# Patient Record
Sex: Female | Born: 1950 | Race: White | Hispanic: No | Marital: Single | Smoking: Former smoker
Health system: Southern US, Community
[De-identification: ages and names within clinical notes are randomized; demographics above are authoritative.]

## PROBLEM LIST (undated history)

## (undated) DIAGNOSIS — M316 Other giant cell arteritis: Secondary | ICD-10-CM

## (undated) DIAGNOSIS — I999 Unspecified disorder of circulatory system: Secondary | ICD-10-CM

## (undated) DIAGNOSIS — I340 Nonrheumatic mitral (valve) insufficiency: Secondary | ICD-10-CM

## (undated) DIAGNOSIS — K2970 Gastritis, unspecified, without bleeding: Secondary | ICD-10-CM

## (undated) DIAGNOSIS — D869 Sarcoidosis, unspecified: Secondary | ICD-10-CM

## (undated) DIAGNOSIS — Z972 Presence of dental prosthetic device (complete) (partial): Secondary | ICD-10-CM

## (undated) DIAGNOSIS — Z9710 Presence of artificial limb (complete) (partial), unspecified: Secondary | ICD-10-CM

## (undated) DIAGNOSIS — K259 Gastric ulcer, unspecified as acute or chronic, without hemorrhage or perforation: Secondary | ICD-10-CM

## (undated) DIAGNOSIS — M810 Age-related osteoporosis without current pathological fracture: Secondary | ICD-10-CM

## (undated) DIAGNOSIS — R0600 Dyspnea, unspecified: Secondary | ICD-10-CM

## (undated) DIAGNOSIS — F32A Depression, unspecified: Secondary | ICD-10-CM

## (undated) DIAGNOSIS — R42 Dizziness and giddiness: Secondary | ICD-10-CM

## (undated) DIAGNOSIS — M1990 Unspecified osteoarthritis, unspecified site: Secondary | ICD-10-CM

## (undated) DIAGNOSIS — Z86718 Personal history of other venous thrombosis and embolism: Secondary | ICD-10-CM

## (undated) DIAGNOSIS — K219 Gastro-esophageal reflux disease without esophagitis: Secondary | ICD-10-CM

## (undated) DIAGNOSIS — I10 Essential (primary) hypertension: Secondary | ICD-10-CM

## (undated) DIAGNOSIS — I739 Peripheral vascular disease, unspecified: Secondary | ICD-10-CM

## (undated) DIAGNOSIS — E785 Hyperlipidemia, unspecified: Secondary | ICD-10-CM

## (undated) DIAGNOSIS — F329 Major depressive disorder, single episode, unspecified: Secondary | ICD-10-CM

## (undated) HISTORY — DX: Sarcoidosis, unspecified: D86.9

## (undated) HISTORY — PX: ADENOIDECTOMY: SUR15

## (undated) HISTORY — PX: BREAST CYST ASPIRATION: SHX578

## (undated) HISTORY — DX: Depression, unspecified: F32.A

## (undated) HISTORY — DX: Gastric ulcer, unspecified as acute or chronic, without hemorrhage or perforation: K25.9

## (undated) HISTORY — DX: Major depressive disorder, single episode, unspecified: F32.9

## (undated) HISTORY — DX: Personal history of other venous thrombosis and embolism: Z86.718

## (undated) HISTORY — DX: Other giant cell arteritis: M31.6

## (undated) HISTORY — DX: Gastritis, unspecified, without bleeding: K29.70

## (undated) HISTORY — PX: VASCULAR SURGERY: SHX849

## (undated) HISTORY — PX: TONSILLECTOMY: SUR1361

## (undated) HISTORY — PX: OTHER SURGICAL HISTORY: SHX169

---
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
## 2010-08-21 LAB — HM PAP SMEAR: HM Pap smear: NORMAL

## 2011-06-08 DIAGNOSIS — I739 Peripheral vascular disease, unspecified: Secondary | ICD-10-CM | POA: Insufficient documentation

## 2011-06-08 DIAGNOSIS — I6529 Occlusion and stenosis of unspecified carotid artery: Secondary | ICD-10-CM | POA: Insufficient documentation

## 2011-06-08 DIAGNOSIS — I779 Disorder of arteries and arterioles, unspecified: Secondary | ICD-10-CM | POA: Insufficient documentation

## 2012-08-21 HISTORY — PX: HEMORROIDECTOMY: SUR656

## 2014-01-28 DIAGNOSIS — I6529 Occlusion and stenosis of unspecified carotid artery: Secondary | ICD-10-CM | POA: Insufficient documentation

## 2014-02-10 ENCOUNTER — Ambulatory Visit: Payer: Self-pay | Admitting: Family Medicine

## 2014-03-11 ENCOUNTER — Ambulatory Visit: Payer: Self-pay | Admitting: Family Medicine

## 2014-08-21 LAB — HM MAMMOGRAPHY: HM MAMMO: NORMAL

## 2014-10-12 DIAGNOSIS — Z7189 Other specified counseling: Secondary | ICD-10-CM | POA: Diagnosis not present

## 2014-10-12 DIAGNOSIS — M810 Age-related osteoporosis without current pathological fracture: Secondary | ICD-10-CM | POA: Diagnosis not present

## 2014-10-12 DIAGNOSIS — I739 Peripheral vascular disease, unspecified: Secondary | ICD-10-CM | POA: Diagnosis not present

## 2014-10-12 DIAGNOSIS — F322 Major depressive disorder, single episode, severe without psychotic features: Secondary | ICD-10-CM | POA: Diagnosis not present

## 2014-10-12 DIAGNOSIS — K219 Gastro-esophageal reflux disease without esophagitis: Secondary | ICD-10-CM | POA: Diagnosis not present

## 2014-11-11 DIAGNOSIS — I739 Peripheral vascular disease, unspecified: Secondary | ICD-10-CM | POA: Diagnosis not present

## 2014-11-11 DIAGNOSIS — K219 Gastro-esophageal reflux disease without esophagitis: Secondary | ICD-10-CM | POA: Diagnosis not present

## 2014-11-11 DIAGNOSIS — Z0000 Encounter for general adult medical examination without abnormal findings: Secondary | ICD-10-CM | POA: Diagnosis not present

## 2014-11-24 DIAGNOSIS — R12 Heartburn: Secondary | ICD-10-CM | POA: Diagnosis not present

## 2014-11-30 ENCOUNTER — Ambulatory Visit
Admit: 2014-11-30 | Disposition: A | Discharge status: Home / Self Care | Payer: Self-pay | Attending: Family Medicine | Admitting: Family Medicine

## 2014-11-30 DIAGNOSIS — Z1231 Encounter for screening mammogram for malignant neoplasm of breast: Secondary | ICD-10-CM | POA: Diagnosis not present

## 2014-12-03 DIAGNOSIS — I70212 Atherosclerosis of native arteries of extremities with intermittent claudication, left leg: Secondary | ICD-10-CM | POA: Diagnosis not present

## 2014-12-03 DIAGNOSIS — I10 Essential (primary) hypertension: Secondary | ICD-10-CM | POA: Diagnosis not present

## 2014-12-03 DIAGNOSIS — I6529 Occlusion and stenosis of unspecified carotid artery: Secondary | ICD-10-CM | POA: Diagnosis not present

## 2014-12-03 DIAGNOSIS — E785 Hyperlipidemia, unspecified: Secondary | ICD-10-CM | POA: Diagnosis not present

## 2014-12-18 ENCOUNTER — Encounter: Payer: Self-pay | Admitting: *Deleted

## 2014-12-21 ENCOUNTER — Encounter
Admission: RE | Disposition: A | Discharge status: Home / Self Care | Payer: Self-pay | Source: Ambulatory Visit | Attending: Gastroenterology

## 2014-12-21 ENCOUNTER — Ambulatory Visit: Payer: Medicare Other | Admitting: Anesthesiology

## 2014-12-21 ENCOUNTER — Ambulatory Visit
Admission: RE | Admit: 2014-12-21 | Discharge: 2014-12-21 | Disposition: A | Discharge status: Home / Self Care | Payer: Medicare Other | Source: Ambulatory Visit | Attending: Gastroenterology | Admitting: Gastroenterology

## 2014-12-21 DIAGNOSIS — K2211 Ulcer of esophagus with bleeding: Secondary | ICD-10-CM | POA: Insufficient documentation

## 2014-12-21 DIAGNOSIS — Z79899 Other long term (current) drug therapy: Secondary | ICD-10-CM | POA: Insufficient documentation

## 2014-12-21 DIAGNOSIS — I999 Unspecified disorder of circulatory system: Secondary | ICD-10-CM | POA: Insufficient documentation

## 2014-12-21 DIAGNOSIS — K210 Gastro-esophageal reflux disease with esophagitis: Secondary | ICD-10-CM | POA: Insufficient documentation

## 2014-12-21 DIAGNOSIS — Z9889 Other specified postprocedural states: Secondary | ICD-10-CM | POA: Diagnosis not present

## 2014-12-21 DIAGNOSIS — Z87891 Personal history of nicotine dependence: Secondary | ICD-10-CM | POA: Insufficient documentation

## 2014-12-21 DIAGNOSIS — Z7982 Long term (current) use of aspirin: Secondary | ICD-10-CM | POA: Insufficient documentation

## 2014-12-21 DIAGNOSIS — M810 Age-related osteoporosis without current pathological fracture: Secondary | ICD-10-CM | POA: Insufficient documentation

## 2014-12-21 DIAGNOSIS — K228 Other specified diseases of esophagus: Secondary | ICD-10-CM | POA: Diagnosis not present

## 2014-12-21 DIAGNOSIS — K219 Gastro-esophageal reflux disease without esophagitis: Secondary | ICD-10-CM | POA: Diagnosis present

## 2014-12-21 DIAGNOSIS — R12 Heartburn: Secondary | ICD-10-CM | POA: Diagnosis not present

## 2014-12-21 HISTORY — DX: Gastro-esophageal reflux disease without esophagitis: K21.9

## 2014-12-21 HISTORY — DX: Age-related osteoporosis without current pathological fracture: M81.0

## 2014-12-21 HISTORY — DX: Dizziness and giddiness: R42

## 2014-12-21 HISTORY — DX: Unspecified disorder of circulatory system: I99.9

## 2014-12-21 HISTORY — PX: ESOPHAGOGASTRODUODENOSCOPY: SHX5428

## 2014-12-21 SURGERY — EGD (ESOPHAGOGASTRODUODENOSCOPY)
Anesthesia: Monitor Anesthesia Care | Site: Esophagus

## 2014-12-21 MED ORDER — GLYCOPYRROLATE 0.2 MG/ML IJ SOLN
INTRAMUSCULAR | Status: DC | PRN
Start: 2014-12-21 — End: 2014-12-21
  Administered 2014-12-21: 0.2 mg via INTRAVENOUS

## 2014-12-21 MED ORDER — ACETAMINOPHEN 160 MG/5ML PO SOLN: 325.0000 mg | ORAL | Status: DC | PRN

## 2014-12-21 MED ORDER — LIDOCAINE HCL (CARDIAC) 20 MG/ML IV SOLN
INTRAVENOUS | Status: DC | PRN
  Administered 2014-12-21: 60 mg via INTRAVENOUS

## 2014-12-21 MED ORDER — ACETAMINOPHEN 325 MG PO TABS: 325.0000 mg | ORAL_TABLET | ORAL | Status: DC | PRN

## 2014-12-21 MED ORDER — STERILE WATER FOR IRRIGATION IR SOLN
Status: DC | PRN
  Administered 2014-12-21: 09:00:00

## 2014-12-21 MED ORDER — LACTATED RINGERS IV SOLN
INTRAVENOUS | Status: DC
  Administered 2014-12-21 (×2): via INTRAVENOUS

## 2014-12-21 MED ORDER — PROPOFOL 10 MG/ML IV BOLUS
INTRAVENOUS | Status: DC | PRN
  Administered 2014-12-21: 30 mg via INTRAVENOUS
  Administered 2014-12-21 (×2): 60 mg via INTRAVENOUS

## 2014-12-21 NOTE — Transfer of Care (Signed)
Immediate Anesthesia Transfer of Care Note  Patient: Sara [name]  Procedure(s) Performed: Procedure(s): ESOPHAGOGASTRODUODENOSCOPY (EGD) (N/A)  Patient Location: PACU  Anesthesia Type: MAC  Level of Consciousness: awake, alert  and patient cooperative  Airway and Oxygen Therapy: Patient Spontanous Breathing and Patient connected to supplemental oxygen  Post-op Assessment: Post-op Vital signs reviewed, Patient's Cardiovascular Status Stable, Respiratory Function Stable, Patent Airway and No signs of Nausea or vomiting  Post-op Vital Signs: Reviewed and stable  Complications: No apparent anesthesia complications

## 2014-12-21 NOTE — H&P (Signed)
@  NTIR@  Primary Care Physician:  Keith Rake, MD Primary Gastroenterologist:  Dr. Allen Norris  Pre-Procedure History & Physical: HPI:  Sara [name] is a 64 y.o. female is here for an EGD.   Past Medical History  Diagnosis Date  . GERD (gastroesophageal reflux disease)   . Osteoporosis   . Vascular disease     Sees Dr. Delana Meyer  . Vertigo     Last episode approx Aug 2015    Past Surgical History  Procedure Laterality Date  . Vascular surgery  4431,5400    Fem-Pop Bypass  . Hemorroidectomy  2014    Prior to Admission medications   Medication Sig Start Date End Date Taking? Authorizing Provider  alendronate (FOSAMAX) 10 MG tablet Take 10 mg by mouth daily before breakfast. Take with a full glass of water on an empty stomach.   Yes Historical Provider, MD  aspirin 81 MG tablet Take 81 mg by mouth daily. PM   Yes Historical Provider, MD  buPROPion (WELLBUTRIN SR) 150 MG 12 hr tablet Take 150 mg by mouth daily. PM   Yes Historical Provider, MD  Multiple Vitamin (MULTIVITAMIN) capsule Take 1 capsule by mouth daily. PM   Yes Historical Provider, MD  venlafaxine XR (EFFEXOR-XR) 75 MG 24 hr capsule Take 75 mg by mouth daily. PM   Yes Historical Provider, MD  Vitamin D, Cholecalciferol, 1000 UNITS CAPS Take 1,000 Units by mouth daily. PM   Yes Historical Provider, MD    Allergies as of 12/17/2014  . (Not on File)    History reviewed. No pertinent family history.  History   Social History  . Marital Status: Single    Spouse Name: N/A  . Number of Children: N/A  . Years of Education: N/A   Occupational History  . Not on file.   Social History Main Topics  . Smoking status: Former Smoker    Quit date: 08/22/1991  . Smokeless tobacco: Not on file  . Alcohol Use: No  . Drug Use: Not on file  . Sexual Activity: Not on file   Other Topics Concern  . Not on file   Social History Narrative  . No narrative on file    Review of Systems: See HPI, otherwise negative ROS  Physical  Exam: Temp(Src) 97.9 F (36.6 C) (Tympanic)  Ht 5\' 4"  (1.626 m)  Wt 136 lb (61.689 kg)  BMI 23.33 kg/m2 General:   Alert,  pleasant and cooperative in NAD Head:  Normocephalic and atraumatic. Neck:  Supple; no masses or thyromegaly. Lungs:  Clear throughout to auscultation.    Heart:  Regular rate and rhythm. Abdomen:  Soft, nontender and nondistended. Normal bowel sounds, without guarding, and without rebound.   Neurologic:  Alert and  oriented x4;  grossly normal neurologically.  Impression/Plan: Sara [name] is here for an EGD to be performed for Heartburn  Risks, benefits, limitations, and alternatives regarding EGD have been reviewed with the patient.  Questions have been answered.  All parties agreeable.

## 2014-12-21 NOTE — Anesthesia Postprocedure Evaluation (Signed)
  Anesthesia Post-op Note  Patient: Sara [name]  Procedure(s) Performed: Procedure(s): ESOPHAGOGASTRODUODENOSCOPY (EGD) (N/A)  Anesthesia type:MAC  Patient location: PACU  Post pain: Pain level controlled  Post assessment: Post-op Vital signs reviewed, Patient's Cardiovascular Status Stable, Respiratory Function Stable, Patent Airway and No signs of Nausea or vomiting  Post vital signs: Reviewed and stable  Last Vitals:  Filed Vitals:   12/21/14 0914  BP:   Pulse: 106  Temp:   Resp: 24    Level of consciousness: awake, alert  and patient cooperative  Complications: No apparent anesthesia complications

## 2014-12-21 NOTE — Anesthesia Preprocedure Evaluation (Signed)
Anesthesia Evaluation  Patient identified by MRN, date of birth, ID band  Airway Mallampati: I  TM Distance: >3 FB     Dental  (+) Upper Dentures   Pulmonary former smoker,    Pulmonary exam normal       Cardiovascular     Neuro/Psych    GI/Hepatic GERD-  ,  Endo/Other    Renal/GU      Musculoskeletal   Abdominal Normal abdominal exam  (+)   Peds  Hematology   Anesthesia Other Findings   Reproductive/Obstetrics                             Anesthesia Physical Anesthesia Plan  ASA: II  Anesthesia Plan: MAC   Post-op Pain Management:    Induction:   Airway Management Planned:   Additional Equipment:   Intra-op Plan:   Post-operative Plan:   Informed Consent: I have reviewed the patients History and Physical, chart, labs and discussed the procedure including the risks, benefits and alternatives for the proposed anesthesia with the patient or authorized representative who has indicated his/her understanding and acceptance.     Plan Discussed with: Anesthesiologist  Anesthesia Plan Comments:         Anesthesia Quick Evaluation

## 2014-12-21 NOTE — Discharge Instructions (Signed)
YOU HAD AN ENDOSCOPIC PROCEDURE TODAY: Refer to the procedure report that was given to you for any specific questions about what was found during the examination.  If the procedure report does not answer your questions, please call your gastroenterologist to clarify. ° °YOU SHOULD EXPECT: Some feelings of bloating in the abdomen. Passage of more gas than usual.  Walking can help get rid of the air that was put into your GI tract during the procedure and reduce the bloating. If you had a lower endoscopy (such as a colonoscopy or flexible sigmoidoscopy) you may notice spotting of blood in your stool or on the toilet paper.  ° °DIET: Your first meal following the procedure should be a light meal and then it is ok to progress to your normal diet.  A half-sandwich or bowl of soup is an example of a good first meal.  Heavy or fried foods are harder to digest and may make you feel nasueas or bloated.  Drink plenty of fluids but you should avoid alcoholic beverages for 24 hours. ° °ACTIVITY: Your care partner should take you home directly after the procedure.  You should plan to take it easy, moving slowly for the rest of the day.  You can resume normal activity the day after the procedure however you should NOT DRIVE, make legal decisions or use heavy machinery for 24 hours (because of the sedation medicines used during the test).   ° °SYMPTOMS TO REPORT IMMEDIATELY  °A gastroenterologist can be reached at any hour.  Please call your doctor's office for any of the following symptoms: ° °· Following lower endoscopy (colonoscopy, flexible sigmoidoscopy) ° Excessive amounts of blood in the stool ° Significant tenderness, worsening of abdominal pains ° Swelling of the abdomen that is new, acute ° Fever of 100° or higher °· Following upper endoscopy (EGD, EUS, ERCP) ° Vomiting of blood or coffee ground material ° New, significant abdominal pain ° New, significant chest pain or pain under the shoulder blades ° Painful or  persistently difficult swallowing ° New shortness of breath ° Black, tarry-looking stools ° °FOLLOW UP: °If any biopsies were taken you will be contacted by phone or by letter within the next 1-3 weeks.  Call your gastroenterologist if you have not heard about the biopsies in 3 weeks.  ° °Please also call your gastroenterologist's office with any specific questions about appointments or follow up tests. °

## 2014-12-21 NOTE — Op Note (Addendum)
[facility] Gastroenterology Patient Name: Sara [name] Procedure Date: 12/21/2014 7:09 AM MRN: [id number] Account #: [id number] Date of Birth: [birth year] Admit Type: Outpatient Age: 64 Room: [facility] OR ROOM 01 Gender: Female Note Status: Finalized Procedure:         Upper GI endoscopy Indications:       Heartburn Providers:         Lucilla Lame, MD Referring MD:      Otila Back. Manuella Ghazi (Referring MD) Medicines:         Propofol per Anesthesia Complications:     No immediate complications. Procedure:         Pre-Anesthesia Assessment:                    - Prior to the procedure, a History and Physical was                     performed, and patient medications and allergies were                     reviewed. The patient's tolerance of previous anesthesia                     was also reviewed. The risks and benefits of the procedure                     and the sedation options and risks were discussed with the                     patient. All questions were answered, and informed consent                     was obtained. Prior Anticoagulants: The patient has taken                     no previous anticoagulant or antiplatelet agents. ASA                     Grade Assessment: II - A patient with mild systemic                     disease. After reviewing the risks and benefits, the                     patient was deemed in satisfactory condition to undergo                     the procedure.                    After obtaining informed consent, the endoscope was passed                     under direct vision. Throughout the procedure, the                     patient's blood pressure, pulse, and oxygen saturations                     were monitored continuously. The Olympus GIF-HQ190                     Endoscope (S#. [id number]) was introduced through the mouth,  and advanced to the second part of duodenum. The upper GI                     endoscopy was  accomplished without difficulty. The patient                     tolerated the procedure well. Findings:      Few cratered esophageal ulcers with oozing blood and stigmata of recent       bleeding were found in the lower third of the esophagus. Biopsies were       taken with a cold forceps for histology.      The stomach was normal.      The examined duodenum was normal. Impression:        - Bleeding esophageal ulcers. Biopsied.                    - Normal stomach.                    - Normal examined duodenum. Recommendation:    - Await pathology results. Procedure Code(s): --- Professional ---                    [phone], Esophagogastroduodenoscopy, flexible, transoral;                     with biopsy, single or multiple Diagnosis Code(s): --- Professional ---                    R12, Heartburn                    K22.11, Ulcer of esophagus with bleeding CPT copyright 2014 American [employer] Association. All rights reserved. The codes documented in this report are preliminary and upon coder review may  be revised to meet current compliance requirements. Lucilla Lame, MD 12/21/2014 8:47:44 AM This report has been signed electronically. Number of Addenda: 0 Note Initiated On: 12/21/2014 7:09 AM      [facility]

## 2014-12-25 ENCOUNTER — Encounter: Payer: Self-pay | Admitting: Gastroenterology

## 2014-12-28 DIAGNOSIS — R12 Heartburn: Secondary | ICD-10-CM | POA: Diagnosis not present

## 2015-01-22 ENCOUNTER — Encounter: Payer: Self-pay | Admitting: Gastroenterology

## 2015-03-09 ENCOUNTER — Ambulatory Visit (INDEPENDENT_AMBULATORY_CARE_PROVIDER_SITE_OTHER): Payer: Medicare Other | Admitting: Family Medicine

## 2015-03-09 ENCOUNTER — Encounter: Payer: Self-pay | Admitting: Family Medicine

## 2015-03-09 ENCOUNTER — Encounter (INDEPENDENT_AMBULATORY_CARE_PROVIDER_SITE_OTHER): Payer: Self-pay

## 2015-03-09 VITALS — BP 118/78 | HR 99 | Temp 98.7°F | Resp 18 | Ht 64.0 in | Wt 134.0 lb

## 2015-03-09 DIAGNOSIS — F339 Major depressive disorder, recurrent, unspecified: Secondary | ICD-10-CM | POA: Diagnosis not present

## 2015-03-09 DIAGNOSIS — K219 Gastro-esophageal reflux disease without esophagitis: Secondary | ICD-10-CM

## 2015-03-09 DIAGNOSIS — M810 Age-related osteoporosis without current pathological fracture: Secondary | ICD-10-CM | POA: Insufficient documentation

## 2015-03-09 DIAGNOSIS — I739 Peripheral vascular disease, unspecified: Secondary | ICD-10-CM | POA: Insufficient documentation

## 2015-03-09 HISTORY — DX: Gastro-esophageal reflux disease without esophagitis: K21.9

## 2015-03-09 MED ORDER — BUPROPION HCL ER (XL) 150 MG PO TB24: 150.0000 mg | ORAL_TABLET | Freq: Every day | ORAL | Status: DC

## 2015-03-09 MED ORDER — VENLAFAXINE HCL ER 75 MG PO CP24: 75.0000 mg | ORAL_CAPSULE | Freq: Every day | ORAL | Status: DC

## 2015-03-09 NOTE — Progress Notes (Signed)
Name: Sara [name]   MRN: [id number]    DOB: 1951-01-01   Date:03/09/2015       Progress Note  Subjective  Chief Complaint  Chief Complaint  Patient presents with  . Medication Refill    HPI Depression Pt. With medication follow up. She has long-standing hx of major depression, symptoms include frequent crying, depressed mood, anxiety, fatigue, anhedonia. No Suicidal ideation. Symptoms are controlled on Wellbutrin XL 150 mg daily and Venlafaxine ER 75 g daily. No side effects from medication.   Past Medical History  Diagnosis Date  . GERD (gastroesophageal reflux disease)   . Osteoporosis   . Vascular disease     Sees Dr. Delana Meyer  . Vertigo     Last episode approx Aug 2015    Past Surgical History  Procedure Laterality Date  . Vascular surgery  0160,1093    Fem-Pop Bypass  . Hemorroidectomy  2014  . Esophagogastroduodenoscopy N/A 12/21/2014    Procedure: ESOPHAGOGASTRODUODENOSCOPY (EGD);  Surgeon: Lucilla Lame, MD;  Location: [place];  Service: Gastroenterology;  Laterality: N/A;    History reviewed. No pertinent family history.  History   Social History  . Marital Status: Single    Spouse Name: N/A  . Number of Children: N/A  . Years of Education: N/A   Occupational History  . Not on file.   Social History Main Topics  . Smoking status: Former Smoker    Quit date: 08/22/1991  . Smokeless tobacco: Not on file  . Alcohol Use: No  . Drug Use: Not on file  . Sexual Activity: Not on file   Other Topics Concern  . Not on file   Social History Narrative     Current outpatient prescriptions:  .  alendronate (FOSAMAX) 10 MG tablet, Take 10 mg by mouth daily before breakfast. Take with a full glass of water on an empty stomach., Disp: , Rfl:  .  alendronate (FOSAMAX) 70 MG tablet, , Disp: , Rfl: 0 .  buPROPion (WELLBUTRIN SR) 150 MG 12 hr tablet, Take 150 mg by mouth daily. PM, Disp: , Rfl:  .  FLOVENT HFA 220 MCG/ACT inhaler, INHALE 1 PUFF PO AND  SWALLOW QID FOR 8 WEEKS RINSE MOUTH WITH WATER AFTER USE, Disp: , Rfl: 3 .  Multiple Vitamin (MULTIVITAMIN) capsule, Take 1 capsule by mouth daily. PM, Disp: , Rfl:  .  venlafaxine XR (EFFEXOR-XR) 75 MG 24 hr capsule, Take 75 mg by mouth daily. PM, Disp: , Rfl:  .  Vitamin D, Cholecalciferol, 1000 UNITS CAPS, Take 1,000 Units by mouth daily. PM, Disp: , Rfl:   Allergies  Allergen Reactions  . Amoxicillin Other (See Comments)    Yeast infection  . Vicodin [Hydrocodone-Acetaminophen] Hives and Rash     Review of Systems  Constitutional: Negative for weight loss.  Psychiatric/Behavioral: Positive for depression. Negative for suicidal ideas. The patient is nervous/anxious. The patient does not have insomnia.       Objective  Filed Vitals:   03/09/15 1214  BP: 118/78  Pulse: 99  Temp: 98.7 F (37.1 C)  TempSrc: Oral  Resp: 18  Height: 5\' 4"  (1.626 m)  Weight: 134 lb (60.782 kg)  SpO2: 99%    Physical Exam  Constitutional: She is oriented to person, place, and time and well-developed, well-nourished, and in no distress.  HENT:  Head: Normocephalic and atraumatic.  Cardiovascular: Normal rate and regular rhythm.   Pulmonary/Chest: Effort normal and breath sounds normal.  Neurological: She is alert and oriented to  person, place, and time.  Skin: Skin is warm and dry.  Psychiatric: Mood, memory, affect and judgment normal.  Vitals reviewed.     Assessment & Plan 1. Chronic recurrent major depressive disorder  - venlafaxine XR (EFFEXOR-XR) 75 MG 24 hr capsule; Take 1 capsule (75 mg total) by mouth daily. PM  Dispense: 90 capsule; Refill: 1 - buPROPion (WELLBUTRIN XL) 150 MG 24 hr tablet; Take 1 tablet (150 mg total) by mouth daily.  Dispense: 90 tablet; Refill: 1   Marcy Sookdeo [facility] 03/09/2015 12:33 PM

## 2015-03-16 ENCOUNTER — Encounter: Payer: Self-pay | Admitting: Family Medicine

## 2015-04-09 ENCOUNTER — Other Ambulatory Visit: Payer: Self-pay | Admitting: Family Medicine

## 2015-05-26 ENCOUNTER — Encounter: Payer: Self-pay | Admitting: Family Medicine

## 2015-05-26 ENCOUNTER — Ambulatory Visit (INDEPENDENT_AMBULATORY_CARE_PROVIDER_SITE_OTHER): Payer: Medicare Other | Admitting: Family Medicine

## 2015-05-26 VITALS — BP 120/80 | HR 113 | Temp 98.8°F | Resp 19 | Ht 64.0 in | Wt 138.8 lb

## 2015-05-26 DIAGNOSIS — Z23 Encounter for immunization: Secondary | ICD-10-CM | POA: Diagnosis not present

## 2015-05-26 DIAGNOSIS — N898 Other specified noninflammatory disorders of vagina: Secondary | ICD-10-CM

## 2015-05-26 DIAGNOSIS — L298 Other pruritus: Secondary | ICD-10-CM | POA: Diagnosis not present

## 2015-05-26 HISTORY — DX: Other specified noninflammatory disorders of vagina: N89.8

## 2015-05-26 LAB — POCT URINALYSIS DIPSTICK
Glucose, UA: NEGATIVE
KETONES UA: NEGATIVE
LEUKOCYTES UA: NEGATIVE
NITRITE UA: NEGATIVE
PH UA: 6.5
RBC UA: NEGATIVE
Spec Grav, UA: 1.01
Urobilinogen, UA: 0.2

## 2015-05-26 LAB — POCT UA - MICROALBUMIN: Microalbumin Ur, POC: NEGATIVE mg/L

## 2015-05-26 MED ORDER — FLUCONAZOLE 150 MG PO TABS: 150.0000 mg | ORAL_TABLET | Freq: Once | ORAL | Status: DC

## 2015-05-26 NOTE — Progress Notes (Signed)
Name: Sara [name]   MRN: [id number]    DOB: 1951-05-27   Date:05/26/2015       Progress Note  Subjective  Chief Complaint  Chief Complaint  Patient presents with  . Vaginitis    Possi yeast infection    HPI  Vaginal yeast Infection Pt. Experiencing symptoms of vaginal and vulvar irritation, present since last 7 days. No burning inside or vaginal discharge but complains of itching. No recent antibiotic therapy.No known history of STIs.  Past Medical History  Diagnosis Date  . GERD (gastroesophageal reflux disease)   . Osteoporosis   . Vascular disease     Sees Dr. Delana Meyer  . Vertigo     Last episode approx Aug 2015    Past Surgical History  Procedure Laterality Date  . Vascular surgery  8099,8338    Fem-Pop Bypass  . Hemorroidectomy  2014  . Esophagogastroduodenoscopy N/A 12/21/2014    Procedure: ESOPHAGOGASTRODUODENOSCOPY (EGD);  Surgeon: Lucilla Lame, MD;  Location: [place];  Service: Gastroenterology;  Laterality: N/A;    History reviewed. No pertinent family history.  Social History   Social History  . Marital Status: Single    Spouse Name: N/A  . Number of Children: N/A  . Years of Education: N/A   Occupational History  . Not on file.   Social History Main Topics  . Smoking status: Former Smoker    Quit date: 08/22/1991  . Smokeless tobacco: Not on file  . Alcohol Use: No  . Drug Use: Not on file  . Sexual Activity: Not on file   Other Topics Concern  . Not on file   Social History Narrative    Current outpatient prescriptions:  .  alendronate (FOSAMAX) 10 MG tablet, Take 10 mg by mouth daily before breakfast. Take with a full glass of water on an empty stomach., Disp: , Rfl:  .  alendronate (FOSAMAX) 70 MG tablet, , Disp: , Rfl: 0 .  buPROPion (WELLBUTRIN XL) 150 MG 24 hr tablet, Take 1 tablet (150 mg total) by mouth daily., Disp: 90 tablet, Rfl: 1 .  Cholecalciferol (D 5000) 5000 UNITS TABS, Take by mouth., Disp: , Rfl:  .  Multiple  Vitamin (MULTIVITAMIN) capsule, Take 1 capsule by mouth daily. PM, Disp: , Rfl:  .  venlafaxine XR (EFFEXOR-XR) 75 MG 24 hr capsule, Take 1 capsule (75 mg total) by mouth daily. PM, Disp: 90 capsule, Rfl: 1  Allergies  Allergen Reactions  . Amoxicillin Other (See Comments)    Yeast infection  . Vicodin [Hydrocodone-Acetaminophen] Hives and Rash   Review of Systems  Gastrointestinal: Negative for abdominal pain.  Genitourinary: Negative for dysuria and urgency.   Objective  Filed Vitals:   05/26/15 1127  BP: 120/80  Pulse: 113  Temp: 98.8 F (37.1 C)  TempSrc: Oral  Resp: 19  Height: 5\' 4"  (1.626 m)  Weight: 138 lb 12.8 oz (62.959 kg)  SpO2: 96%    Physical Exam  Genitourinary: Uterus normal and cervix normal. Vulva exhibits no erythema, no exudate, no lesion and no rash. Vagina exhibits normal mucosa, no exudate and no lesion.    Recent Results (from the past 2160 hour(s))  POCT UA - Microalbumin     Status: Normal   Collection Time: 05/26/15 12:02 PM  Result Value Ref Range   Microalbumin Ur, POC neg mg/L   Creatinine, POC  mg/dL   Albumin/Creatinine Ratio, Urine, POC    POCT urinalysis dipstick     Status: None   Collection  Time: 05/26/15 12:17 PM  Result Value Ref Range   Color, UA yellow    Clarity, UA clear    Glucose, UA neg    Bilirubin, UA small    Ketones, UA neg    Spec Grav, UA 1.010    Blood, UA neg    pH, UA 6.5    Protein, UA trace    Urobilinogen, UA 0.2    Nitrite, UA neg    Leukocytes, UA Negative Negative   Assessment & Plan  1. Need for immunization against influenza  - Flu Vaccine QUAD 36+ mos PF IM (Fluarix & Fluzone Quad PF)  2. Vaginal pruritus UA is negative. Pelvic exam shows no vulvar vaginal, or cervical lesions. No discharge noted.we will prescribe one time dose of Diflucan 150 mg. If her symptoms do not resolve after antifungal therapy, she will need a referral to gynecology. Patient verbalized understanding with the  plan.  - POCT UA - Microalbumin - POCT urinalysis dipstick - fluconazole (DIFLUCAN) 150 MG tablet; Take 1 tablet (150 mg total) by mouth once.  Dispense: 1 tablet; Refill: 0     Sara [name] [facility] 05/26/2015 12:52 PM

## 2015-05-28 ENCOUNTER — Encounter: Payer: Self-pay | Admitting: Family Medicine

## 2015-06-09 ENCOUNTER — Encounter: Payer: Self-pay | Admitting: Family Medicine

## 2015-06-09 ENCOUNTER — Ambulatory Visit (INDEPENDENT_AMBULATORY_CARE_PROVIDER_SITE_OTHER): Payer: Medicare Other | Admitting: Family Medicine

## 2015-06-09 VITALS — BP 122/73 | HR 94 | Temp 99.0°F | Resp 18 | Ht 64.0 in | Wt 138.3 lb

## 2015-06-09 DIAGNOSIS — M810 Age-related osteoporosis without current pathological fracture: Secondary | ICD-10-CM

## 2015-06-09 MED ORDER — ALENDRONATE SODIUM 70 MG PO TABS: 70.0000 mg | ORAL_TABLET | ORAL | Status: DC

## 2015-06-09 NOTE — Progress Notes (Signed)
Name: Sara [name]   MRN: [id number]    DOB: [birth year]   Date:06/09/2015       Progress Note  Subjective  Chief Complaint  Chief Complaint  Patient presents with  . Follow-up    3 mo  . Gastroesophageal Reflux  . Medication Refill    HPI   Osteoporosis  Pt. With history of Osteoporosis, last BMD with Ts-score of -2.7 at the femur. Pt. Was prescribed 10 mg Fosamax which she has not been taking. She has no history of bone disease, esophageal strictures. She has occasional heartburn relieved with OTC antacids.  Past Medical History  Diagnosis Date  . GERD (gastroesophageal reflux disease)   . Osteoporosis   . Vascular disease     Sees Dr. Delana [name]  . Vertigo     Last episode approx Aug 2015    Past Surgical History  Procedure Laterality Date  . Vascular surgery  [id number]    Fem-Pop Bypass  . Hemorroidectomy  2014  . Esophagogastroduodenoscopy N/A 12/21/2014    Procedure: ESOPHAGOGASTRODUODENOSCOPY (EGD);  Surgeon: Sara Lame, MD;  Location: [place];  Service: Gastroenterology;  Laterality: N/A;    History reviewed. No pertinent family history.  Social History   Social History  . Marital Status: Single    Spouse Name: N/A  . Number of Children: N/A  . Years of Education: N/A   Occupational History  . Not on file.   Social History Main Topics  . Smoking status: Former Smoker    Quit date: 08/22/1991  . Smokeless tobacco: Not on file  . Alcohol Use: No  . Drug Use: Not on file  . Sexual Activity: Not on file   Other Topics Concern  . Not on file   Social History Narrative    Current outpatient prescriptions:  .  alendronate (FOSAMAX) 70 MG tablet, , Disp: , Rfl: 0 .  buPROPion (WELLBUTRIN XL) 150 MG 24 hr tablet, Take 1 tablet (150 mg total) by mouth daily., Disp: 90 tablet, Rfl: 1 .  Cholecalciferol (D 5000) 5000 UNITS TABS, Take by mouth., Disp: , Rfl:  .  Multiple Vitamin (MULTIVITAMIN) capsule, Take 1 capsule by mouth daily. PM, Disp: ,  Rfl:  .  venlafaxine XR (EFFEXOR-XR) 75 MG 24 hr capsule, Take 1 capsule (75 mg total) by mouth daily. PM, Disp: 90 capsule, Rfl: 1  Allergies  Allergen Reactions  . Amoxicillin Other (See Comments)    Yeast infection  . Vicodin [Hydrocodone-Acetaminophen] Hives and Rash   Review of Systems  Gastrointestinal: Positive for heartburn. Negative for nausea, vomiting and abdominal pain.  Musculoskeletal: Positive for back pain and joint pain.    Objective  Filed Vitals:   06/09/15 1057  BP: 122/73  Pulse: 94  Temp: 99 F (37.2 C)  TempSrc: Oral  Resp: 18  Height: 5\' 4"  (1.626 m)  Weight: 138 lb 4.8 oz (62.732 kg)  SpO2: 97%    Physical Exam  Constitutional: She is well-developed, well-nourished, and in no distress.  Cardiovascular: Normal rate and regular rhythm.   Pulmonary/Chest: Effort normal and breath sounds normal.  Abdominal: Soft. Bowel sounds are normal.  Nursing note and vitals reviewed.  Assessment & Plan  1. Osteoporosis, post-menopausal  Guarded patient on alendronate 70 mg every weekly. Precautions regarding GI side effects provided. We will check liver, kidney function, and calcium levels prior to initiation of therapy. Patient also asked to consult with gastroenterologist if she can take Alendronate given her history of esophageal ulcers. Patient verbalized  understanding. Repeat DEXA scan in July 2017. - alendronate (FOSAMAX) 70 MG tablet; Take 1 tablet (70 mg total) by mouth once a week.  Dispense: 52 tablet; Refill: 0 - Comprehensive Metabolic Panel (CMET)   Sara [name] [facility] 06/09/2015 11:14 AM

## 2015-06-10 LAB — COMPREHENSIVE METABOLIC PANEL
ALK PHOS: 59 IU/L (ref 39–117)
ALT: 27 IU/L (ref 0–32)
AST: 29 IU/L (ref 0–40)
Albumin/Globulin Ratio: 2.1 (ref 1.1–2.5)
Albumin: 4.6 g/dL (ref 3.6–4.8)
BUN/Creatinine Ratio: 18 (ref 11–26)
BUN: 16 mg/dL (ref 8–27)
Bilirubin Total: 0.3 mg/dL (ref 0.0–1.2)
CHLORIDE: 101 mmol/L (ref 97–106)
CO2: 23 mmol/L (ref 18–29)
Calcium: 9.2 mg/dL (ref 8.7–10.3)
Creatinine, Ser: 0.87 mg/dL (ref 0.57–1.00)
GFR calc Af Amer: 83 mL/min/{1.73_m2} (ref >59)
GFR calc non Af Amer: 72 mL/min/{1.73_m2} (ref >59)
GLUCOSE: 97 mg/dL (ref 65–99)
Globulin, Total: 2.2 g/dL (ref 1.5–4.5)
Potassium: 4.9 mmol/L (ref 3.5–5.2)
Sodium: 138 mmol/L (ref 136–144)
Total Protein: 6.8 g/dL (ref 6.0–8.5)

## 2015-07-14 ENCOUNTER — Encounter: Payer: Self-pay | Admitting: Family Medicine

## 2015-07-14 ENCOUNTER — Ambulatory Visit (INDEPENDENT_AMBULATORY_CARE_PROVIDER_SITE_OTHER): Payer: Medicare Other | Admitting: Family Medicine

## 2015-07-14 VITALS — BP 118/75 | HR 116 | Temp 98.9°F | Resp 18 | Ht 64.0 in | Wt 139.3 lb

## 2015-07-14 DIAGNOSIS — J0100 Acute maxillary sinusitis, unspecified: Secondary | ICD-10-CM

## 2015-07-14 HISTORY — DX: Acute maxillary sinusitis, unspecified: J01.00

## 2015-07-14 MED ORDER — BENZONATATE 200 MG PO CAPS: 200.0000 mg | ORAL_CAPSULE | Freq: Three times a day (TID) | ORAL | Status: DC | PRN

## 2015-07-14 MED ORDER — AZITHROMYCIN 250 MG PO TABS: ORAL_TABLET | ORAL | Status: DC

## 2015-07-14 NOTE — Progress Notes (Signed)
Name: Sara [name]   MRN: [id number]    DOB: Nov 08, 1950   Date:07/14/2015       Progress Note  Subjective  Chief Complaint  Chief Complaint  Patient presents with  . Acute Visit    Cough x2 days    Sinusitis This is a new problem. The current episode started yesterday (2 days ago). There has been no fever. Associated symptoms include coughing, headaches and sinus pressure. Pertinent negatives include no chills, congestion, neck pain, shortness of breath or sore throat. Past treatments include nothing.    Past Medical History  Diagnosis Date  . GERD (gastroesophageal reflux disease)   . Osteoporosis   . Vascular disease     Sees Dr. Delana Meyer  . Vertigo     Last episode approx Aug 2015    Past Surgical History  Procedure Laterality Date  . Vascular surgery  [id number]    Fem-Pop Bypass  . Hemorroidectomy  2014  . Esophagogastroduodenoscopy N/A 12/21/2014    Procedure: ESOPHAGOGASTRODUODENOSCOPY (EGD);  Surgeon: Lucilla Lame, MD;  Location: [facility];  Service: Gastroenterology;  Laterality: N/A;    History reviewed. No pertinent family history.  Social History   Social History  . Marital Status: Single    Spouse Name: N/A  . Number of Children: N/A  . Years of Education: N/A   Occupational History  . Not on file.   Social History Main Topics  . Smoking status: Former Smoker    Quit date: 08/22/1991  . Smokeless tobacco: Not on file  . Alcohol Use: No  . Drug Use: Not on file  . Sexual Activity: Not on file   Other Topics Concern  . Not on file   Social History Narrative     Current outpatient prescriptions:  .  alendronate (FOSAMAX) 70 MG tablet, Take 1 tablet (70 mg total) by mouth once a week., Disp: 52 tablet, Rfl: 0 .  buPROPion (WELLBUTRIN XL) 150 MG 24 hr tablet, Take 1 tablet (150 mg total) by mouth daily., Disp: 90 tablet, Rfl: 1 .  Cholecalciferol (D 5000) 5000 UNITS TABS, Take by mouth., Disp: , Rfl:  .  Multiple Vitamin (MULTIVITAMIN)  capsule, Take 1 capsule by mouth daily. PM, Disp: , Rfl:  .  venlafaxine XR (EFFEXOR-XR) 75 MG 24 hr capsule, Take 1 capsule (75 mg total) by mouth daily. PM, Disp: 90 capsule, Rfl: 1  Allergies  Allergen Reactions  . Amoxicillin Other (See Comments)    Yeast infection  . Vicodin [Hydrocodone-Acetaminophen] Hives and Rash     Review of Systems  Constitutional: Negative for fever and chills.  HENT: Positive for sinus pressure. Negative for congestion and sore throat.   Respiratory: Positive for cough. Negative for shortness of breath.   Cardiovascular: Negative for chest pain and palpitations.  Musculoskeletal: Negative for neck pain.  Neurological: Positive for headaches.    Objective  Filed Vitals:   07/14/15 0818  BP: 118/75  Pulse: 116  Temp: 98.9 F (37.2 C)  TempSrc: Oral  Resp: 18  Height: 5\' 4"  (1.626 m)  Weight: 139 lb 4.8 oz (63.186 kg)  SpO2: 97%    Physical Exam  Constitutional: She is oriented to person, place, and time and well-developed, well-nourished, and in no distress.  HENT:  Head: Normocephalic and atraumatic.  Right Ear: Tympanic membrane and ear canal normal.  Left Ear: Tympanic membrane and ear canal normal.  Nose: No mucosal edema. Right sinus exhibits maxillary sinus tenderness. Right sinus exhibits no frontal sinus tenderness.  Left sinus exhibits maxillary sinus tenderness. Left sinus exhibits no frontal sinus tenderness.  Mouth/Throat: No posterior oropharyngeal erythema.  Neck: Neck supple.  Cardiovascular: Regular rhythm and normal heart sounds.  Tachycardia present.   No murmur heard. Pulmonary/Chest: Effort normal and breath sounds normal. She has no wheezes.  Neurological: She is alert and oriented to person, place, and time.  Nursing note and vitals reviewed.    Assessment & Plan  1. Acute non-recurrent maxillary sinusitis Recommended conservative treatment with increased fluid intake and if symptoms do not resolve within the  next 48 hours, she may start taking Z-Pak. Prescription for [employer] provided for cough. - azithromycin (ZITHROMAX Z-PAK) 250 MG tablet; 2 tabs po x day 1, then 1 tab po q day x 4 days  Dispense: 6 each; Refill: 0 - benzonatate (TESSALON) 200 MG capsule; Take 1 capsule (200 mg total) by mouth 3 (three) times daily as needed for cough.  Dispense: 20 capsule; Refill: 0   Nicolaas Savo [facility] 07/14/2015 8:27 AM

## 2015-07-15 ENCOUNTER — Other Ambulatory Visit: Payer: Self-pay | Admitting: Family Medicine

## 2015-09-06 ENCOUNTER — Ambulatory Visit (INDEPENDENT_AMBULATORY_CARE_PROVIDER_SITE_OTHER): Payer: Medicare Other | Admitting: Family Medicine

## 2015-09-06 ENCOUNTER — Encounter: Payer: Self-pay | Admitting: Family Medicine

## 2015-09-06 VITALS — BP 129/82 | HR 106 | Temp 98.5°F | Resp 16 | Ht 64.0 in | Wt 139.2 lb

## 2015-09-06 DIAGNOSIS — K2100 Gastro-esophageal reflux disease with esophagitis, without bleeding: Secondary | ICD-10-CM

## 2015-09-06 DIAGNOSIS — M810 Age-related osteoporosis without current pathological fracture: Secondary | ICD-10-CM | POA: Diagnosis not present

## 2015-09-06 DIAGNOSIS — F329 Major depressive disorder, single episode, unspecified: Secondary | ICD-10-CM | POA: Insufficient documentation

## 2015-09-06 DIAGNOSIS — Z7189 Other specified counseling: Secondary | ICD-10-CM

## 2015-09-06 DIAGNOSIS — I779 Disorder of arteries and arterioles, unspecified: Secondary | ICD-10-CM | POA: Diagnosis not present

## 2015-09-06 DIAGNOSIS — I70229 Atherosclerosis of native arteries of extremities with rest pain, unspecified extremity: Secondary | ICD-10-CM | POA: Insufficient documentation

## 2015-09-06 DIAGNOSIS — Z7689 Persons encountering health services in other specified circumstances: Secondary | ICD-10-CM

## 2015-09-06 DIAGNOSIS — Z23 Encounter for immunization: Secondary | ICD-10-CM

## 2015-09-06 DIAGNOSIS — F339 Major depressive disorder, recurrent, unspecified: Secondary | ICD-10-CM

## 2015-09-06 DIAGNOSIS — F1021 Alcohol dependence, in remission: Secondary | ICD-10-CM | POA: Insufficient documentation

## 2015-09-06 DIAGNOSIS — F32A Depression, unspecified: Secondary | ICD-10-CM | POA: Insufficient documentation

## 2015-09-06 DIAGNOSIS — I739 Peripheral vascular disease, unspecified: Secondary | ICD-10-CM | POA: Insufficient documentation

## 2015-09-06 MED ORDER — VENLAFAXINE HCL ER 75 MG PO CP24: 75.0000 mg | ORAL_CAPSULE | Freq: Every day | ORAL | Status: DC

## 2015-09-06 MED ORDER — PANTOPRAZOLE SODIUM 40 MG PO TBEC: 40.0000 mg | DELAYED_RELEASE_TABLET | Freq: Two times a day (BID) | ORAL | Status: DC

## 2015-09-06 MED ORDER — IBANDRONATE SODIUM 150 MG PO TABS: ORAL_TABLET | ORAL | Status: DC

## 2015-09-06 MED ORDER — BUPROPION HCL ER (XL) 150 MG PO TB24: 150.0000 mg | ORAL_TABLET | Freq: Every day | ORAL | Status: DC

## 2015-09-06 NOTE — Patient Instructions (Signed)
We will try taking protonix twice daily for your acid reflux. You can also take Zantac twice daily as needed for acid reflux. Please seek immediate medical attention for vomiting blood, blood in the stool.   We will try Boniva to see if you tolerate that better for your osteoporosis.   You are due for a Well Woman exam this year for a pap smear. Make an appt whenever it is convenient for you.

## 2015-09-06 NOTE — Assessment & Plan Note (Signed)
Start PPI twice daily given severe symptoms and history of esophagitis. Check CBC today. Recheck 1 mos. Alarm symptoms reviewed.  Obtain GI records to determine previous treatment.  Consider GI f/u if not improving.

## 2015-09-06 NOTE — Progress Notes (Signed)
Subjective:    Patient ID: Sara [name], female    DOB: [birth year], 65 y.o.   MRN: [id number]  HPI: Sara [name] is a 65 y.o. female presenting on 09/06/2015 for Establish Care   HPI  Pt presents to establish care today. Previous care provider was Dr. Manuella Ghazi at [facility].  It has been 2 months since Her last PCP visit. Records from previous provider will be requested and reviewed. Current medical problems include:  PAD: Sees Dr. Delana Meyer once yearly for a check-up. Previous Fem-pop bypass in 1991 and 2011. DVT in RLE at age of 14- was traveling for 7-8 hours in a car. Taking OCPs. Takes daily aspirin.  Osteoporosis: T score of 2.7- tried fosamax but she cannot tolerate it. Causes too much acid reflux. Is not taking regular medication for acid reflux.  GERD: Saw Dr. Allen Norris for refractory GERD. Found esophageal ulcers. Was started on a medication to help- it did not. Not currently taking PPI. No dyphagia. Has acid burn- wakes up in the middle of the night. Takes tums to calm it down.   Health maintenance:  Last pap 2012- normal. No history of abnormal.  Mammo- 2016- normal. Would like to screen every 4 hours.  Bone density scan; 2015- osteoporosis. Due July 2017.  Colonoscopy 2013- due in 5 years due to family history of colon cancer. Due 2018.  Former smoker- quit in the 1990s. Last TDAP- 2013.   Past Medical History  Diagnosis Date  . GERD (gastroesophageal reflux disease)   . Osteoporosis   . Vascular disease     Sees Dr. Delana Meyer  . Vertigo     Last episode approx Aug 2015  . Depression   . History of blood clots   . Stomach ulcer    Social History   Social History  . Marital Status: Single    Spouse Name: N/A  . Number of Children: N/A  . Years of Education: N/A   Occupational History  . Not on file.   Social History Main Topics  . Smoking status: Former Smoker    Quit date: 08/22/1991  . Smokeless tobacco: Not on file  . Alcohol Use: No  . Drug Use: No  . Sexual  Activity: Not on file   Other Topics Concern  . Not on file   Social History Narrative   Family History  Problem Relation Age of Onset  . Cancer Father     colon cancer  . Heart disease Maternal Uncle    Current Outpatient Prescriptions on File Prior to Visit  Medication Sig  . Cholecalciferol (D 5000) 5000 UNITS TABS Take by mouth.  . Multiple Vitamin (MULTIVITAMIN) capsule Take 1 capsule by mouth daily. PM   No current facility-administered medications on file prior to visit.    Review of Systems  Constitutional: Negative for fever and chills.  HENT: Negative.   Respiratory: Negative for cough, chest tightness and wheezing.   Cardiovascular: Positive for leg swelling (occasional. Wears support hose. ). Negative for chest pain.  Gastrointestinal: Positive for abdominal pain. Negative for nausea, vomiting, diarrhea, constipation, blood in stool and anal bleeding.  Endocrine: Negative.  Negative for cold intolerance, heat intolerance, polydipsia, polyphagia and polyuria.  Genitourinary: Negative for dysuria and difficulty urinating.  Musculoskeletal: Negative.  Negative for back pain.  Skin: Negative for rash and wound.  Neurological: Negative for dizziness, light-headedness and numbness.  Psychiatric/Behavioral: Negative.  Negative for suicidal ideas, behavioral problems, decreased concentration and agitation. The patient is not nervous/anxious.  Per HPI unless specifically indicated above     Objective:    BP 129/82 mmHg  Pulse 106  Temp(Src) 98.5 F (36.9 C) (Oral)  Resp 16  Ht 5\' 4"  (1.626 m)  Wt 139 lb 3.2 oz (63.141 kg)  BMI 23.88 kg/m2  Wt Readings from Last 3 Encounters:  09/06/15 139 lb 3.2 oz (63.141 kg)  07/14/15 139 lb 4.8 oz (63.186 kg)  06/09/15 138 lb 4.8 oz (62.732 kg)    Physical Exam  Constitutional: She is oriented to person, place, and time. She appears well-developed and well-nourished.  HENT:  Head: Normocephalic and atraumatic.  Neck:  Neck supple.  Cardiovascular: Normal rate, regular rhythm and normal heart sounds.  Exam reveals no gallop and no friction rub.   No murmur heard. Pulmonary/Chest: Effort normal and breath sounds normal. She has no wheezes. She exhibits no tenderness.  Abdominal: Soft. Normal appearance and bowel sounds are normal. She exhibits no distension and no mass. There is no tenderness. There is no rebound and no guarding.  Musculoskeletal: Normal range of motion. She exhibits no edema or tenderness.  Lymphadenopathy:    She has no cervical adenopathy.  Neurological: She is alert and oriented to person, place, and time.  Skin: Skin is warm and dry.     Depression screen [facility] 2/9 09/06/2015 07/14/2015 06/09/2015 05/26/2015 03/09/2015  Decreased Interest 0 0 0 0 0  Down, Depressed, Hopeless 0 0 0 0 0  PHQ - 2 Score 0 0 0 0 0     Results for orders placed or performed in visit on 09/06/15  HM MAMMOGRAPHY  Result Value Ref Range   HM Mammogram normal   HM PAP SMEAR  Result Value Ref Range   HM Pap smear normal       Assessment & Plan:   Problem List Items Addressed This Visit      Cardiovascular and Mediastinum   Peripheral arterial occlusive disease ([place])    Managed by vascular. Pt is taking daily aspirin. Not currently on a statin.  Suggested she speak with vascular to determine if this is recommended.       Relevant Orders   Lipid Profile   Comprehensive Metabolic Panel (CMET)     Digestive   GERD (gastroesophageal reflux disease)    Start PPI twice daily given severe symptoms and history of esophagitis. Check CBC today. Recheck 1 mos. Alarm symptoms reviewed.  Obtain GI records to determine previous treatment.  Consider GI f/u if not improving.       Relevant Medications   pantoprazole (PROTONIX) 40 MG tablet   Other Relevant Orders   CBC with Differential     Musculoskeletal and Integument   Osteoporosis, post-menopausal    Change Fosamax to Boniva to see if patient can  tolerate. Consider infusion if pt is unable to tolerate PO.  Check vitamin D and Cr. Today. Due for bone density scan in July 2017.       Relevant Medications   ibandronate (BONIVA) 150 MG tablet   Other Relevant Orders   VITAMIN D 25 Hydroxy (Vit-D Deficiency, Fractures)     Other   Chronic recurrent major depressive disorder ([place])    Controlled on current medications. Renewed today.       Relevant Medications   venlafaxine XR (EFFEXOR-XR) 75 MG 24 hr capsule   buPROPion (WELLBUTRIN XL) 150 MG 24 hr tablet    Other Visit Diagnoses    Encounter to establish care    -  Primary    Need for zoster vaccination        Relevant Orders    Varicella-zoster vaccine subcutaneous (Completed)       Meds ordered this encounter  Medications  . venlafaxine XR (EFFEXOR-XR) 75 MG 24 hr capsule    Sig: Take 1 capsule (75 mg total) by mouth daily with breakfast.    Dispense:  90 capsule    Refill:  3    Order Specific Question:  Supervising Provider    Answer:  Arlis Porta [phone]  . buPROPion (WELLBUTRIN XL) 150 MG 24 hr tablet    Sig: Take 1 tablet (150 mg total) by mouth daily.    Dispense:  90 tablet    Refill:  3    Order Specific Question:  Supervising Provider    Answer:  Arlis Porta [phone]  . ibandronate (BONIVA) 150 MG tablet    Sig: Take 1 tablets once per month. Take in the morning with a full glass of water, on an empty stomach, do not lie down for the next 30 min.    Dispense:  3 tablet    Refill:  0    Order Specific Question:  Supervising Provider    Answer:  Arlis Porta [phone]  . pantoprazole (PROTONIX) 40 MG tablet    Sig: Take 1 tablet (40 mg total) by mouth 2 (two) times daily.    Dispense:  180 tablet    Refill:  3    Order Specific Question:  Supervising Provider    Answer:  Arlis Porta [phone]      Follow up plan: Return in about 4 weeks (around 10/04/2015) for acid reflux. Marland Kitchen

## 2015-09-06 NOTE — Assessment & Plan Note (Signed)
Change Fosamax to Boniva to see if patient can tolerate. Consider infusion if pt is unable to tolerate PO.  Check vitamin D and Cr. Today. Due for bone density scan in July 2017.

## 2015-09-06 NOTE — Assessment & Plan Note (Signed)
Controlled on current medications. Renewed today.

## 2015-09-06 NOTE — Assessment & Plan Note (Signed)
Managed by vascular. Pt is taking daily aspirin. Not currently on a statin.  Suggested she speak with vascular to determine if this is recommended.

## 2015-09-22 LAB — CBC WITH DIFFERENTIAL/PLATELET
BASOS ABS: 0.1 10*3/uL (ref 0.0–0.2)
Basos: 1 %
EOS (ABSOLUTE): 0.2 10*3/uL (ref 0.0–0.4)
Eos: 4 %
Hematocrit: 44.4 % (ref 34.0–46.6)
Hemoglobin: 14.4 g/dL (ref 11.1–15.9)
IMMATURE GRANS (ABS): 0 10*3/uL (ref 0.0–0.1)
IMMATURE GRANULOCYTES: 0 %
LYMPHS: 26 %
Lymphocytes Absolute: 1.1 10*3/uL (ref 0.7–3.1)
MCH: 31.6 pg (ref 26.6–33.0)
MCHC: 32.4 g/dL (ref 31.5–35.7)
MCV: 97 fL (ref 79–97)
MONOS ABS: 0.4 10*3/uL (ref 0.1–0.9)
Monocytes: 9 %
NEUTROS PCT: 60 %
Neutrophils Absolute: 2.6 10*3/uL (ref 1.4–7.0)
PLATELETS: 222 10*3/uL (ref 150–379)
RBC: 4.56 x10E6/uL (ref 3.77–5.28)
RDW: 13.8 % (ref 12.3–15.4)
WBC: 4.3 10*3/uL (ref 3.4–10.8)

## 2015-09-22 LAB — COMPREHENSIVE METABOLIC PANEL
ALBUMIN: 4.2 g/dL (ref 3.6–4.8)
ALK PHOS: 56 IU/L (ref 39–117)
ALT: 19 IU/L (ref 0–32)
AST: 29 IU/L (ref 0–40)
Albumin/Globulin Ratio: 1.6 (ref 1.1–2.5)
BILIRUBIN TOTAL: 0.3 mg/dL (ref 0.0–1.2)
BUN / CREAT RATIO: 13 (ref 11–26)
BUN: 12 mg/dL (ref 8–27)
CO2: 22 mmol/L (ref 18–29)
CREATININE: 0.92 mg/dL (ref 0.57–1.00)
Calcium: 9.4 mg/dL (ref 8.7–10.3)
Chloride: 100 mmol/L (ref 96–106)
GFR calc non Af Amer: 67 mL/min/{1.73_m2} (ref >59)
GFR, EST AFRICAN AMERICAN: 77 mL/min/{1.73_m2} (ref >59)
GLOBULIN, TOTAL: 2.6 g/dL (ref 1.5–4.5)
GLUCOSE: 85 mg/dL (ref 65–99)
Potassium: 4.5 mmol/L (ref 3.5–5.2)
SODIUM: 139 mmol/L (ref 134–144)
TOTAL PROTEIN: 6.8 g/dL (ref 6.0–8.5)

## 2015-09-22 LAB — VITAMIN D 25 HYDROXY (VIT D DEFICIENCY, FRACTURES): VIT D 25 HYDROXY: 45.8 ng/mL (ref 30.0–100.0)

## 2015-09-22 LAB — LIPID PANEL
CHOLESTEROL TOTAL: 164 mg/dL (ref 100–199)
Chol/HDL Ratio: 1.9 ratio units (ref 0.0–4.4)
HDL: 85 mg/dL (ref >39)
LDL Calculated: 68 mg/dL (ref 0–99)
Triglycerides: 57 mg/dL (ref 0–149)
VLDL CHOLESTEROL CAL: 11 mg/dL (ref 5–40)

## 2015-10-04 ENCOUNTER — Ambulatory Visit: Payer: Medicare Other | Admitting: Family Medicine

## 2015-10-11 ENCOUNTER — Other Ambulatory Visit: Payer: Self-pay | Admitting: Family Medicine

## 2015-10-11 ENCOUNTER — Ambulatory Visit (INDEPENDENT_AMBULATORY_CARE_PROVIDER_SITE_OTHER): Payer: Medicare Other | Admitting: Family Medicine

## 2015-10-11 VITALS — BP 138/84 | HR 112 | Temp 98.9°F | Resp 16 | Ht 64.0 in | Wt 142.0 lb

## 2015-10-11 DIAGNOSIS — L309 Dermatitis, unspecified: Secondary | ICD-10-CM

## 2015-10-11 DIAGNOSIS — Z01419 Encounter for gynecological examination (general) (routine) without abnormal findings: Secondary | ICD-10-CM | POA: Diagnosis not present

## 2015-10-11 DIAGNOSIS — L301 Dyshidrosis [pompholyx]: Secondary | ICD-10-CM

## 2015-10-11 DIAGNOSIS — Z124 Encounter for screening for malignant neoplasm of cervix: Secondary | ICD-10-CM | POA: Diagnosis not present

## 2015-10-11 DIAGNOSIS — I779 Disorder of arteries and arterioles, unspecified: Secondary | ICD-10-CM

## 2015-10-11 MED ORDER — TRIAMCINOLONE ACETONIDE 0.1 % EX CREA: 1.0000 "application " | TOPICAL_CREAM | Freq: Two times a day (BID) | CUTANEOUS | Status: DC

## 2015-10-11 NOTE — Assessment & Plan Note (Signed)
Followed by [facility], however pt is requesting a second opinion at [facility] surgery. Referral will be placed today. Have recommended pt be on statin for secondary prevention- she is not willing at this time- would like to speak to vascular surgery.

## 2015-10-11 NOTE — Progress Notes (Signed)
Subjective:    Patient ID: Sara [name], female    DOB: April 05, 1951, 65 y.o.   MRN: [id number]  HPI: Sara [name] is a 65 y.o. female presenting on 10/11/2015 for Annual Exam   HPI  Pt presents for annual exam and pap smear. Last pap was 2012. No changes in breast. Occasional BSE- No vaginal discharge. Using Replens for vaginal dryness- improving symptoms but still uncomfortable. Not currently sexually active. Overall doing well.   Has a spot on R ankle- hx of PAD- no previous ulcers. Noticed spot- 3 weeks ago. Spot is red and getting larger. Skin is scaling but not broken. Painful and itchy. Relieved by Cerna lotion.   PAD: Sees vascular in June. Is having issues with claudication after walking 91ft- has addressed with Vascular surgery but they keep telling her blood flow with normal. Has been tried on several medications she cannot remember in the past. Was unable to tolerate. She is not taking a statin- unsure why. She cannot take aspirin due to GI irritation. Pt is requesting to second opinion of Vascular surgery- would like to go to [facility].   Past Medical History  Diagnosis Date  . GERD (gastroesophageal reflux disease)   . Osteoporosis   . Vascular disease     Sees Dr. Delana Meyer  . Vertigo     Last episode approx Aug 2015  . Depression   . History of blood clots   . Stomach ulcer    Social History   Social History  . Marital Status: Single    Spouse Name: N/A  . Number of Children: N/A  . Years of Education: N/A   Occupational History  . Not on file.   Social History Main Topics  . Smoking status: Former Smoker    Quit date: 08/22/1991  . Smokeless tobacco: Not on file  . Alcohol Use: No  . Drug Use: No  . Sexual Activity: Not on file   Other Topics Concern  . Not on file   Social History Narrative   Family History  Problem Relation Age of Onset  . Cancer Father     colon cancer  . Heart disease Maternal Uncle    Current Outpatient Prescriptions on File Prior  to Visit  Medication Sig  . buPROPion (WELLBUTRIN XL) 150 MG 24 hr tablet Take 1 tablet (150 mg total) by mouth daily.  . Cholecalciferol (D 5000) 5000 UNITS TABS Take by mouth.  . ibandronate (BONIVA) 150 MG tablet Take 1 tablets once per month. Take in the morning with a full glass of water, on an empty stomach, do not lie down for the next 30 min.  . Multiple Vitamin (MULTIVITAMIN) capsule Take 1 capsule by mouth daily. PM  . pantoprazole (PROTONIX) 40 MG tablet Take 1 tablet (40 mg total) by mouth 2 (two) times daily.  Marland Kitchen venlafaxine XR (EFFEXOR-XR) 75 MG 24 hr capsule Take 1 capsule (75 mg total) by mouth daily with breakfast.   No current facility-administered medications on file prior to visit.    Review of Systems  Constitutional: Negative for fever and chills.  HENT: Negative.   Respiratory: Negative for cough, chest tightness and wheezing.   Cardiovascular: Negative for chest pain and leg swelling.  Gastrointestinal: Negative for nausea, vomiting, abdominal pain, diarrhea and constipation.  Endocrine: Negative.  Negative for cold intolerance, heat intolerance, polydipsia, polyphagia and polyuria.  Genitourinary: Negative for dysuria, vaginal bleeding, vaginal discharge, difficulty urinating, vaginal pain and pelvic pain.  Musculoskeletal: Negative.  Neurological: Negative for dizziness, light-headedness and numbness.  Psychiatric/Behavioral: Negative.    Per HPI unless specifically indicated above     Objective:    BP 138/84 mmHg  Pulse 112  Temp(Src) 98.9 F (37.2 C) (Oral)  Resp 16  Ht 5\' 4"  (1.626 m)  Wt 142 lb (64.411 kg)  BMI 24.36 kg/m2  Wt Readings from Last 3 Encounters:  10/11/15 142 lb (64.411 kg)  09/06/15 139 lb 3.2 oz (63.141 kg)  07/14/15 139 lb 4.8 oz (63.186 kg)    Physical Exam  Constitutional: She is oriented to person, place, and time. She appears well-developed and well-nourished.  HENT:  Head: Normocephalic and atraumatic.  Neck: Normal  range of motion. Neck supple. No thyromegaly present.  Cardiovascular: Normal rate and regular rhythm.  PMI is not displaced.  Exam reveals no gallop and no friction rub.   No murmur heard. Pulses:      Posterior tibial pulses are 1+ on the right side, and 1+ on the left side.  Pulmonary/Chest: Breath sounds normal. No respiratory distress. She has no wheezes.  Abdominal: Soft. Bowel sounds are normal. She exhibits no distension. There is no tenderness.  Genitourinary: Vagina normal and uterus normal. No breast swelling, tenderness or discharge. There is no rash or tenderness on the right labia. There is no rash, tenderness or lesion on the left labia. Uterus is not tender. Cervix exhibits no motion tenderness, no discharge and no friability. Right adnexum displays no mass, no tenderness and no fullness. Left adnexum displays no mass, no tenderness and no fullness. No tenderness or bleeding in the vagina. No signs of injury around the vagina.  Atrophy of vaginal wall.   Musculoskeletal: Normal range of motion. She exhibits no edema or tenderness.  Lymphadenopathy:    She has no cervical adenopathy.  Neurological: She is alert and oriented to person, place, and time.  Skin: Skin is warm and dry. Rash noted. Rash is macular. There is erythema.  Red 1cm in diameter scaly spot on R ankle.  Smaller lesion on dorsal surface of the foot. Blanches with pressure.   Psychiatric: She has a normal mood and affect. Her behavior is normal. Judgment normal.   Results for orders placed or performed in visit on 09/06/15  HM MAMMOGRAPHY  Result Value Ref Range   HM Mammogram normal   HM PAP SMEAR  Result Value Ref Range   HM Pap smear normal   CBC with Differential  Result Value Ref Range   WBC 4.3 3.4 - 10.8 x10E3/uL   RBC 4.56 3.77 - 5.28 x10E6/uL   Hemoglobin 14.4 11.1 - 15.9 g/dL   Hematocrit 44.4 34.0 - 46.6 %   MCV 97 79 - 97 fL   MCH 31.6 26.6 - 33.0 pg   MCHC 32.4 31.5 - 35.7 g/dL   RDW 13.8  12.3 - 15.4 %   Platelets 222 150 - 379 x10E3/uL   Neutrophils 60 %   Lymphs 26 %   Monocytes 9 %   Eos 4 %   Basos 1 %   Neutrophils Absolute 2.6 1.4 - 7.0 x10E3/uL   Lymphocytes Absolute 1.1 0.7 - 3.1 x10E3/uL   Monocytes Absolute 0.4 0.1 - 0.9 x10E3/uL   EOS (ABSOLUTE) 0.2 0.0 - 0.4 x10E3/uL   Basophils Absolute 0.1 0.0 - 0.2 x10E3/uL   Immature Granulocytes 0 %   Immature Grans (Abs) 0.0 0.0 - 0.1 x10E3/uL  Lipid Profile  Result Value Ref Range   Cholesterol, Total 164 100 -  199 mg/dL   Triglycerides 57 0 - 149 mg/dL   HDL 85 >39 mg/dL   VLDL Cholesterol Cal 11 5 - 40 mg/dL   LDL Calculated 68 0 - 99 mg/dL   Chol/HDL Ratio 1.9 0.0 - 4.4 ratio units  Comprehensive Metabolic Panel (CMET)  Result Value Ref Range   Glucose 85 65 - 99 mg/dL   BUN 12 8 - 27 mg/dL   Creatinine, Ser 0.92 0.57 - 1.00 mg/dL   GFR calc non Af Amer 67 >59 mL/min/1.73   GFR calc Af Amer 77 >59 mL/min/1.73   BUN/Creatinine Ratio 13 11 - 26   Sodium 139 134 - 144 mmol/L   Potassium 4.5 3.5 - 5.2 mmol/L   Chloride 100 96 - 106 mmol/L   CO2 22 18 - 29 mmol/L   Calcium 9.4 8.7 - 10.3 mg/dL   Total Protein 6.8 6.0 - 8.5 g/dL   Albumin 4.2 3.6 - 4.8 g/dL   Globulin, Total 2.6 1.5 - 4.5 g/dL   Albumin/Globulin Ratio 1.6 1.1 - 2.5   Bilirubin Total 0.3 0.0 - 1.2 mg/dL   Alkaline Phosphatase 56 39 - 117 IU/L   AST 29 0 - 40 IU/L   ALT 19 0 - 32 IU/L  VITAMIN D 25 Hydroxy (Vit-D Deficiency, Fractures)  Result Value Ref Range   Vit D, 25-Hydroxy 45.8 30.0 - 100.0 ng/mL      Assessment & Plan:   Problem List Items Addressed This Visit      Cardiovascular and Mediastinum   Peripheral arterial occlusive disease ([facility])    Followed by [facility], however pt is requesting a second opinion at [facility]. Referral will be placed today. Have recommended pt be on statin for secondary prevention- she is not willing at this time- would like to speak to vascular surgery.        Relevant Orders   Ambulatory referral to Vascular Surgery    Other Visit Diagnoses    Well woman exam with routine gynecological exam    -  Primary    Health maintenance reviewed. Pap updated.     Relevant Orders    Pap IG and HPV (high risk) DNA detection    Screening for cervical cancer        Relevant Orders    Pap IG and HPV (high risk) DNA detection    Vesicular foot eczema        Trial of steroids to help with foot rash. Return if symptoms not improving.     Relevant Medications    triamcinolone cream (KENALOG) 0.1 %       Meds ordered this encounter  Medications  . triamcinolone cream (KENALOG) 0.1 %    Sig: Apply 1 application topically 2 (two) times daily.    Dispense:  30 g    Refill:  2    Order Specific Question:  Supervising Provider    Answer:  Arlis Porta [id number]      Follow up plan: No Follow-up on file.

## 2015-10-11 NOTE — Patient Instructions (Signed)
Health Maintenance, Female Adopting a healthy lifestyle and getting preventive care can go a long way to promote health and wellness. Talk with your health care provider about what schedule of regular examinations is right for you. This is a good chance for you to check in with your provider about disease prevention and staying healthy. In between checkups, there are plenty of things you can do on your own. Experts have done a lot of research about which lifestyle changes and preventive measures are most likely to keep you healthy. Ask your health care provider for more information. WEIGHT AND DIET  Eat a healthy diet  Be sure to include plenty of vegetables, fruits, low-fat dairy products, and lean protein.  Do not eat a lot of foods high in solid fats, added sugars, or salt.  Get regular exercise. This is one of the most important things you can do for your health.  Most adults should exercise for at least 150 minutes each week. The exercise should increase your heart rate and make you sweat (moderate-intensity exercise).  Most adults should also do strengthening exercises at least twice a week. This is in addition to the moderate-intensity exercise.  Maintain a healthy weight  Body mass index (BMI) is a measurement that can be used to identify possible weight problems. It estimates body fat based on height and weight. Your health care provider can help determine your BMI and help you achieve or maintain a healthy weight.  For females 20 years of age and older:   A BMI below 18.5 is considered underweight.  A BMI of 18.5 to 24.9 is normal.  A BMI of 25 to 29.9 is considered overweight.  A BMI of 30 and above is considered obese.  Watch levels of cholesterol and blood lipids  You should start having your blood tested for lipids and cholesterol at 63 years of age, then have this test every 5 years.  You may need to have your cholesterol levels checked more often if:  Your lipid  or cholesterol levels are high.  You are older than 63 years of age.  You are at high risk for heart disease.  CANCER SCREENING   Lung Cancer  Lung cancer screening is recommended for adults 55-80 years old who are at high risk for lung cancer because of a history of smoking.  A yearly low-dose CT scan of the lungs is recommended for people who:  Currently smoke.  Have quit within the past 15 years.  Have at least a 30-pack-year history of smoking. A pack year is smoking an average of one pack of cigarettes a day for 1 year.  Yearly screening should continue until it has been 15 years since you quit.  Yearly screening should stop if you develop a health problem that would prevent you from having lung cancer treatment.  Breast Cancer  Practice breast self-awareness. This means understanding how your breasts normally appear and feel.  It also means doing regular breast self-exams. Let your health care provider know about any changes, no matter how small.  If you are in your 20s or 30s, you should have a clinical breast exam (CBE) by a health care provider every 1-3 years as part of a regular health exam.  If you are 40 or older, have a CBE every year. Also consider having a breast X-ray (mammogram) every year.  If you have a family history of breast cancer, talk to your health care provider about genetic screening.  If you   are at high risk for breast cancer, talk to your health care provider about having an MRI and a mammogram every year.  Breast cancer gene (BRCA) assessment is recommended for women who have family members with BRCA-related cancers. BRCA-related cancers include:  Breast.  Ovarian.  Tubal.  Peritoneal cancers.  Results of the assessment will determine the need for genetic counseling and BRCA1 and BRCA2 testing. Cervical Cancer Your health care provider may recommend that you be screened regularly for cancer of the pelvic organs (ovaries, uterus, and  vagina). This screening involves a pelvic examination, including checking for microscopic changes to the surface of your cervix (Pap test). You may be encouraged to have this screening done every 3 years, beginning at age 21.  For women ages 30-65, health care providers may recommend pelvic exams and Pap testing every 3 years, or they may recommend the Pap and pelvic exam, combined with testing for human papilloma virus (HPV), every 5 years. Some types of HPV increase your risk of cervical cancer. Testing for HPV may also be done on women of any age with unclear Pap test results.  Other health care providers may not recommend any screening for nonpregnant women who are considered low risk for pelvic cancer and who do not have symptoms. Ask your health care provider if a screening pelvic exam is right for you.  If you have had past treatment for cervical cancer or a condition that could lead to cancer, you need Pap tests and screening for cancer for at least 20 years after your treatment. If Pap tests have been discontinued, your risk factors (such as having a new sexual partner) need to be reassessed to determine if screening should resume. Some women have medical problems that increase the chance of getting cervical cancer. In these cases, your health care provider may recommend more frequent screening and Pap tests. Colorectal Cancer  This type of cancer can be detected and often prevented.  Routine colorectal cancer screening usually begins at 63 years of age and continues through 63 years of age.  Your health care provider may recommend screening at an earlier age if you have risk factors for colon cancer.  Your health care provider may also recommend using home test kits to check for hidden blood in the stool.  A small camera at the end of a tube can be used to examine your colon directly (sigmoidoscopy or colonoscopy). This is done to check for the earliest forms of colorectal  cancer.  Routine screening usually begins at age 50.  Direct examination of the colon should be repeated every 5-10 years through 63 years of age. However, you may need to be screened more often if early forms of precancerous polyps or small growths are found. Skin Cancer  Check your skin from head to toe regularly.  Tell your health care provider about any new moles or changes in moles, especially if there is a change in a mole's shape or color.  Also tell your health care provider if you have a mole that is larger than the size of a pencil eraser.  Always use sunscreen. Apply sunscreen liberally and repeatedly throughout the day.  Protect yourself by wearing long sleeves, pants, a wide-brimmed hat, and sunglasses whenever you are outside. HEART DISEASE, DIABETES, AND HIGH BLOOD PRESSURE   High blood pressure causes heart disease and increases the risk of stroke. High blood pressure is more likely to develop in:  People who have blood pressure in the high end   of the normal range (130-139/85-89 mm Hg).  People who are overweight or obese.  People who are African American.  If you are 38-23 years of age, have your blood pressure checked every 3-5 years. If you are 61 years of age or older, have your blood pressure checked every year. You should have your blood pressure measured twice--once when you are at a hospital or clinic, and once when you are not at a hospital or clinic. Record the average of the two measurements. To check your blood pressure when you are not at a hospital or clinic, you can use:  An automated blood pressure machine at a pharmacy.  A home blood pressure monitor.  If you are between 45 years and 39 years old, ask your health care provider if you should take aspirin to prevent strokes.  Have regular diabetes screenings. This involves taking a blood sample to check your fasting blood sugar level.  If you are at a normal weight and have a low risk for diabetes,  have this test once every three years after 63 years of age.  If you are overweight and have a high risk for diabetes, consider being tested at a younger age or more often. PREVENTING INFECTION  Hepatitis B  If you have a higher risk for hepatitis B, you should be screened for this virus. You are considered at high risk for hepatitis B if:  You were born in a country where hepatitis B is common. Ask your health care provider which countries are considered high risk.  Your parents were born in a high-risk country, and you have not been immunized against hepatitis B (hepatitis B vaccine).  You have HIV or AIDS.  You use needles to inject street drugs.  You live with someone who has hepatitis B.  You have had sex with someone who has hepatitis B.  You get hemodialysis treatment.  You take certain medicines for conditions, including cancer, organ transplantation, and autoimmune conditions. Hepatitis C  Blood testing is recommended for:  Everyone born from [birth year] through 1963.  Anyone with known risk factors for hepatitis C. Sexually transmitted infections (STIs)  You should be screened for sexually transmitted infections (STIs) including gonorrhea and chlamydia if:  You are sexually active and are younger than 63 years of age.  You are older than 63 years of age and your health care provider tells you that you are at risk for this type of infection.  Your sexual activity has changed since you were last screened and you are at an increased risk for chlamydia or gonorrhea. Ask your health care provider if you are at risk.  If you do not have HIV, but are at risk, it may be recommended that you take a prescription medicine daily to prevent HIV infection. This is called pre-exposure prophylaxis (PrEP). You are considered at risk if:  You are sexually active and do not regularly use condoms or know the HIV status of your partner(s).  You take drugs by injection.  You are sexually  active with a partner who has HIV. Talk with your health care provider about whether you are at high risk of being infected with HIV. If you choose to begin PrEP, you should first be tested for HIV. You should then be tested every 3 months for as long as you are taking PrEP.  PREGNANCY   If you are premenopausal and you may become pregnant, ask your health care provider about preconception counseling.  If you may  become pregnant, take 400 to 800 micrograms (mcg) of folic acid every day.  If you want to prevent pregnancy, talk to your health care provider about birth control (contraception). OSTEOPOROSIS AND MENOPAUSE   Osteoporosis is a disease in which the bones lose minerals and strength with aging. This can result in serious bone fractures. Your risk for osteoporosis can be identified using a bone density scan.  If you are 61 years of age or older, or if you are at risk for osteoporosis and fractures, ask your health care provider if you should be screened.  Ask your health care provider whether you should take a calcium or vitamin D supplement to lower your risk for osteoporosis.  Menopause may have certain physical symptoms and risks.  Hormone replacement therapy may reduce some of these symptoms and risks. Talk to your health care provider about whether hormone replacement therapy is right for you.  HOME CARE INSTRUCTIONS   Schedule regular health, dental, and eye exams.  Stay current with your immunizations.   Do not use any tobacco products including cigarettes, chewing tobacco, or electronic cigarettes.  If you are pregnant, do not drink alcohol.  If you are breastfeeding, limit how much and how often you drink alcohol.  Limit alcohol intake to no more than 1 drink per day for nonpregnant women. One drink equals 12 ounces of beer, 5 ounces of wine, or 1 ounces of hard liquor.  Do not use street drugs.  Do not share needles.  Ask your health care provider for help if  you need support or information about quitting drugs.  Tell your health care provider if you often feel depressed.  Tell your health care provider if you have ever been abused or do not feel safe at home.   This information is not intended to replace advice given to you by your health care provider. Make sure you discuss any questions you have with your health care provider.   Document Released: 02/20/2011 Document Revised: 08/28/2014 Document Reviewed: 07/09/2013 Elsevier Interactive Patient Education [employer].

## 2015-10-17 LAB — PAP IG AND HPV HIGH-RISK
HPV, HIGH-RISK: NEGATIVE
PAP SMEAR COMMENT: 0

## 2015-10-18 ENCOUNTER — Telehealth: Payer: Self-pay | Admitting: *Deleted

## 2015-10-18 NOTE — Telephone Encounter (Signed)
Referral placed to [facility] vascular. They are requesting a cd of any CT, MRI, and or [place] to be mailed to them prior to scheduling. [facility] Attn: Susy Manor [address] [place] [address] Ph# [phone] Fax# [phone]

## 2015-11-17 ENCOUNTER — Other Ambulatory Visit: Payer: Self-pay | Admitting: Family Medicine

## 2015-11-17 DIAGNOSIS — M810 Age-related osteoporosis without current pathological fracture: Secondary | ICD-10-CM

## 2015-12-06 ENCOUNTER — Other Ambulatory Visit: Payer: Self-pay | Admitting: Family Medicine

## 2015-12-06 ENCOUNTER — Ambulatory Visit (INDEPENDENT_AMBULATORY_CARE_PROVIDER_SITE_OTHER): Payer: Medicare Other | Admitting: Family Medicine

## 2015-12-06 VITALS — BP 140/80 | HR 106 | Temp 98.8°F | Resp 16 | Ht 64.0 in | Wt 145.0 lb

## 2015-12-06 DIAGNOSIS — Z7251 High risk heterosexual behavior: Secondary | ICD-10-CM

## 2015-12-06 DIAGNOSIS — N952 Postmenopausal atrophic vaginitis: Secondary | ICD-10-CM | POA: Diagnosis not present

## 2015-12-06 LAB — POCT WET PREP (WET MOUNT): Clue Cells Wet Prep Whiff POC: NEGATIVE

## 2015-12-06 MED ORDER — CLOBETASOL PROPIONATE 0.05 % EX CREA: 1.0000 "application " | TOPICAL_CREAM | Freq: Two times a day (BID) | CUTANEOUS | Status: DC

## 2015-12-06 NOTE — Patient Instructions (Addendum)

## 2015-12-06 NOTE — Progress Notes (Signed)
Subjective:    Patient ID: Sara [name], female    DOB: March 21, 1951, 65 y.o.   MRN: [id number]  HPI: Sara [name] is a 65 y.o. female presenting on 12/06/2015 for Vaginitis   HPI  Pt presents for recurrent yeast infections. Has them off and on.  Treating with OTC equate treatment- symptoms come within 1 week. Symptoms increasing over past few months. Symptoms start with itching on the labia. No white cheesy discharge. Irritated labia. Internal cream helps with itching. Vaginal dryness.  Pt also requesting screening of hepatitis C. Multiple sexual partners in her lifetime. Concerned about exposure in her youth.   Past Medical History  Diagnosis Date  . GERD (gastroesophageal reflux disease)   . Osteoporosis   . Vascular disease     Sees Dr. Delana Meyer  . Vertigo     Last episode approx Aug 2015  . Depression   . History of blood clots   . Stomach ulcer     Current Outpatient Prescriptions on File Prior to Visit  Medication Sig  . buPROPion (WELLBUTRIN XL) 150 MG 24 hr tablet Take 1 tablet (150 mg total) by mouth daily.  . Cholecalciferol (D 5000) 5000 UNITS TABS Take by mouth.  . ibandronate (BONIVA) 150 MG tablet Take 1 tablet by mouth once a month in the morning with full glass of water on an  empty stomach ; do not lie  down for 30 minutes  . Multiple Vitamin (MULTIVITAMIN) capsule Take 1 capsule by mouth daily. PM  . pantoprazole (PROTONIX) 40 MG tablet Take 1 tablet (40 mg total) by mouth 2 (two) times daily.  Marland Kitchen triamcinolone cream (KENALOG) 0.1 % Apply 1 application topically 2 (two) times daily.  Marland Kitchen venlafaxine XR (EFFEXOR-XR) 75 MG 24 hr capsule Take 1 capsule (75 mg total) by mouth daily with breakfast.   No current facility-administered medications on file prior to visit.    Review of Systems  Constitutional: Negative for fever and chills.  HENT: Negative.   Respiratory: Negative for cough, chest tightness and wheezing.   Cardiovascular: Negative for chest pain and  leg swelling.  Gastrointestinal: Negative for nausea, vomiting, abdominal pain, diarrhea and constipation.  Endocrine: Negative.  Negative for cold intolerance, heat intolerance, polydipsia, polyphagia and polyuria.  Genitourinary: Negative for dysuria, vaginal discharge, difficulty urinating and vaginal pain.       Vaginal itching and irritation   Musculoskeletal: Negative.   Neurological: Negative for dizziness, light-headedness and numbness.  Psychiatric/Behavioral: Negative.    Per HPI unless specifically indicated above     Objective:    BP 140/80 mmHg  Pulse 106  Temp(Src) 98.8 F (37.1 C) (Oral)  Resp 16  Ht 5\' 4"  (1.626 m)  Wt 145 lb (65.772 kg)  BMI 24.88 kg/m2  Wt Readings from Last 3 Encounters:  12/06/15 145 lb (65.772 kg)  10/11/15 142 lb (64.411 kg)  09/06/15 139 lb 3.2 oz (63.141 kg)    Physical Exam  Constitutional: She is oriented to person, place, and time. She appears well-developed and well-nourished.  HENT:  Head: Normocephalic and atraumatic.  Neck: Normal range of motion. Neck supple.  Cardiovascular: Normal rate, regular rhythm and normal heart sounds.  Exam reveals no gallop and no friction rub.   No murmur heard. Pulmonary/Chest: Effort normal and breath sounds normal. She has no wheezes. She exhibits no tenderness.  Abdominal: Soft. Normal appearance and bowel sounds are normal. She exhibits no distension and no mass. There is no tenderness. There is no rebound  and no guarding.  Genitourinary: Vagina normal. There is no tenderness, lesion or injury on the right labia. There is no tenderness, lesion or injury on the left labia. No erythema or tenderness in the vagina. No vaginal discharge found.  Decrease elasticity of vulvar skin. Vaginal introital narrowing.   Musculoskeletal: Normal range of motion. She exhibits no edema or tenderness.  Neurological: She is alert and oriented to person, place, and time.  Skin: Skin is warm and dry.   Results for  orders placed or performed in visit on 12/06/15  POCT Wet Prep [facility])  Result Value Ref Range   Source Wet Prep POC     WBC, Wet Prep HPF POC few    Bacteria Wet Prep HPF POC Few None, Few, Too numerous to count   BACTERIA WET PREP MORPHOLOGY POC     Clue Cells Wet Prep HPF POC None None, Too numerous to count   Clue Cells Wet Prep Whiff POC Negative Whiff    Yeast Wet Prep HPF POC Few    KOH Wet Prep POC     Trichomonas Wet Prep HPF POC none       Assessment & Plan:   Problem List Items Addressed This Visit    None    Visit Diagnoses    Risky sexual behavior    -  Primary    Screen for Hep C.     Relevant Orders    Hepatitis c antibody (reflex)    Atrophic vaginitis        Trial of vaginal moisturizer.  Clobetasol for very irritated skin.  Consider topical estrogens if symptoms become severe. Return PRN.     Relevant Medications    clobetasol cream (TEMOVATE) 0.05 %    Other Relevant Orders    POCT Wet Prep [facility]) (Completed)       Meds ordered this encounter  Medications  . clobetasol cream (TEMOVATE) 0.05 %    Sig: Apply 1 application topically 2 (two) times daily.    Dispense:  30 g    Refill:  0    Order Specific Question:  Supervising Provider    Answer:  Arlis Porta [id number]      Follow up plan: Return if symptoms worsen or fail to improve.

## 2015-12-07 LAB — HCV COMMENT:

## 2015-12-07 LAB — HEPATITIS C ANTIBODY (REFLEX)

## 2015-12-08 ENCOUNTER — Ambulatory Visit: Payer: Medicare Other | Admitting: Family Medicine

## 2015-12-28 ENCOUNTER — Ambulatory Visit (INDEPENDENT_AMBULATORY_CARE_PROVIDER_SITE_OTHER): Payer: Medicare Other | Admitting: Family Medicine

## 2015-12-28 ENCOUNTER — Encounter: Payer: Self-pay | Admitting: Family Medicine

## 2015-12-28 VITALS — BP 122/76 | HR 97 | Temp 98.5°F | Resp 16 | Ht 64.0 in | Wt 148.0 lb

## 2015-12-28 DIAGNOSIS — R0789 Other chest pain: Secondary | ICD-10-CM | POA: Diagnosis not present

## 2015-12-28 DIAGNOSIS — G4700 Insomnia, unspecified: Secondary | ICD-10-CM

## 2015-12-28 DIAGNOSIS — H8113 Benign paroxysmal vertigo, bilateral: Secondary | ICD-10-CM

## 2015-12-28 DIAGNOSIS — H8110 Benign paroxysmal vertigo, unspecified ear: Secondary | ICD-10-CM

## 2015-12-28 DIAGNOSIS — I779 Disorder of arteries and arterioles, unspecified: Secondary | ICD-10-CM

## 2015-12-28 DIAGNOSIS — M810 Age-related osteoporosis without current pathological fracture: Secondary | ICD-10-CM

## 2015-12-28 HISTORY — DX: Benign paroxysmal vertigo, unspecified ear: H81.10

## 2015-12-28 MED ORDER — DIAZEPAM 5 MG PO TABS: ORAL_TABLET | ORAL | Status: DC

## 2015-12-28 MED ORDER — MELATONIN 1 MG PO TABS: 1.0000 | ORAL_TABLET | Freq: Every evening | ORAL | Status: DC

## 2015-12-28 NOTE — Progress Notes (Signed)
Subjective:    Patient ID: Sara [name], female    DOB: [birth year], 65 y.o.   MRN: [id number]  HPI: Sara [name] is a 65 y.o. female presenting on 12/28/2015 for Dizziness   HPI  Pt presents for vertigo symptoms. Has had issues since 2006. Saw ENT and neurology- was placed on valium which resolved symptoms. Has had meclizine before- has not helped. Symptoms started 2 weeks ago. Some nausea. Balance feels off. Wakes up in the middle of the night- room is spinning. Balance feels off. Looking up and down set off her symptoms. Must be careful when she bending over.  Pt also reporting difficulty sleeping- wakes up early in the AM.  She goes to sleep fine but wakes up between 3-5. Sometimes she just gets up if she can't sleep.  Pt also reporting  episode of chest pain. Woke her up from sleep 1 week ago. Heaviness and pressure in chest and radiated to R side, lasted about 1 minute. Has happened in the past. Only occurring at rest. No associated symptoms of nausea, diaphoresis, or shortness of breath. No chest pain on exertion. Pt has history of PVD. Pt has family history of heart disease- multiple heart attacks in mom and grandparents.  Past Medical History  Diagnosis Date  . GERD (gastroesophageal reflux disease)   . Osteoporosis   . Vascular disease     Sees Dr. Delana Meyer  . Vertigo     Last episode approx Aug 2015  . Depression   . History of blood clots   . Stomach ulcer     Current Outpatient Prescriptions on File Prior to Visit  Medication Sig  . buPROPion (WELLBUTRIN XL) 150 MG 24 hr tablet Take 1 tablet (150 mg total) by mouth daily.  . Cholecalciferol (D 5000) 5000 UNITS TABS Take by mouth.  . clobetasol cream (TEMOVATE) [id number] % Apply 1 application topically 2 (two) times daily.  . Multiple Vitamin (MULTIVITAMIN) capsule Take 1 capsule by mouth daily. PM  . pantoprazole (PROTONIX) 40 MG tablet Take 1 tablet (40 mg total) by mouth 2 (two) times daily.  Marland Kitchen triamcinolone cream (KENALOG)  0.1 % Apply 1 application topically 2 (two) times daily.  Marland Kitchen venlafaxine XR (EFFEXOR-XR) 75 MG 24 hr capsule Take 1 capsule (75 mg total) by mouth daily with breakfast.  . ibandronate (BONIVA) 150 MG tablet Take 1 tablet by mouth once a month in the morning with full glass of water on an  empty stomach ; do not lie  down for 30 minutes (Patient not taking: Reported on 12/28/2015)   No current facility-administered medications on file prior to visit.    Review of Systems  Constitutional: Negative for fever and chills.  HENT: Negative.  Negative for congestion, rhinorrhea and sinus pressure.   Respiratory: Negative for cough, chest tightness and wheezing.   Cardiovascular: Positive for chest pain. Negative for palpitations and leg swelling.  Gastrointestinal: Positive for nausea. Negative for vomiting, abdominal pain, diarrhea and constipation.  Endocrine: Negative.  Negative for cold intolerance, heat intolerance, polydipsia, polyphagia and polyuria.  Genitourinary: Negative for dysuria and difficulty urinating.  Musculoskeletal: Negative.   Neurological: Positive for dizziness. Negative for light-headedness and numbness.  Psychiatric/Behavioral: Negative.    Per HPI unless specifically indicated above     Objective:    BP 122/76 mmHg  Pulse 97  Temp(Src) 98.5 F (36.9 C) (Oral)  Resp 16  Ht 5\' 4"  (1.626 m)  Wt 148 lb (67.132 kg)  BMI 25.39 kg/m2  Wt Readings from Last 3 Encounters:  12/28/15 148 lb (67.132 kg)  12/06/15 145 lb (65.772 kg)  10/11/15 142 lb (64.411 kg)    Physical Exam  Constitutional: She is oriented to person, place, and time. She appears well-developed and well-nourished.  HENT:  Head: Normocephalic and atraumatic.  Eyes: Pupils are equal, round, and reactive to light. Right eye exhibits nystagmus. Right eye exhibits normal extraocular motion. Left eye exhibits nystagmus. Left eye exhibits normal extraocular motion.  Neck: Neck supple.  Cardiovascular: Normal  rate, regular rhythm and normal heart sounds.  PMI is not displaced.  Exam reveals no gallop and no friction rub.   No murmur heard. Pulmonary/Chest: Effort normal and breath sounds normal. She has no wheezes. She exhibits no tenderness.  Abdominal: Soft. Normal appearance and bowel sounds are normal. She exhibits no distension and no mass. There is no tenderness. There is no rebound and no guarding.  Musculoskeletal: Normal range of motion. She exhibits no edema or tenderness.  Lymphadenopathy:    She has no cervical adenopathy.  Neurological: She is alert and oriented to person, place, and time. She has normal strength and normal reflexes. No cranial nerve deficit or sensory deficit. She displays a negative Romberg sign.  Skin: Skin is warm and dry.  Psychiatric: She has a normal mood and affect. Her behavior is normal. Judgment and thought content normal.       Assessment & Plan:   Problem List Items Addressed This Visit      Cardiovascular and Mediastinum   Peripheral arterial occlusive disease ([place])    Followed by vein and vascular. Consider history of PAD will refer to cardiology for work-up of chest pain.        Nervous and Auditory   Vertigo, benign paroxysmal - Primary    Treat with valium PRN for dizziness. Consider vestibular rehab if symptoms do not improve.         Musculoskeletal and Integument   Osteoporosis, post-menopausal   Relevant Orders   DG Bone Density    Other Visit Diagnoses    Insomnia        Trial of melatonin for early AM awakenings.    Relevant Medications    Melatonin 1 MG TABS    Chest pressure        ECG showed non-specific T wave changes. No acute process. Pt to ER if occurs again. Consider risk factors- will refer to cardiology for evaluation.     Relevant Orders    EKG 12-Lead    Ambulatory referral to Cardiology       Meds ordered this encounter  Medications  . diazepam (VALIUM) 5 MG tablet    Sig: Take every 8 hours as needed for  vertigo.    Dispense:  30 tablet    Refill:  0    Order Specific Question:  Supervising Provider    Answer:  Arlis Porta [phone]  . Melatonin 1 MG TABS    Sig: Take 1 tablet (1 mg total) by mouth Nightly.    Order Specific Question:  Supervising Provider    Answer:  Arlis Porta [id number]      Follow up plan: Return in about 2 weeks (around 01/11/2016), or if symptoms worsen or fail to improve, for vertigo.

## 2015-12-28 NOTE — Assessment & Plan Note (Signed)
Followed by vein and vascular. Consider history of PAD will refer to cardiology for work-up of chest pain.

## 2015-12-28 NOTE — Patient Instructions (Addendum)
Chest pain: We will have you see cardiology given your history of artery blockages and family heart disease. If you experience chest pain again, please seek immediate medical care.   Please seek immediate medical attention at ER or Urgent Care if you develop: Chest pain, pressure or tightness. Shortness of breath accompanied by nausea or diaphoresis Visual changes Numbness or tingling on one side of the body Facial droop Altered mental status Or any concerning symptoms.   Vertigo Vertigo means you feel like you or your surroundings are moving when they are not. Vertigo can be dangerous if it occurs when you are at work, driving, or performing difficult activities.  CAUSES  Vertigo occurs when there is a conflict of signals sent to your brain from the visual and sensory systems in your body. There are many different causes of vertigo, including:  Infections, especially in the inner ear.  A bad reaction to a drug or misuse of alcohol and medicines.  Withdrawal from drugs or alcohol.  Rapidly changing positions, such as lying down or rolling over in bed.  A migraine headache.  Decreased blood flow to the brain.  Increased pressure in the brain from a head injury, infection, tumor, or bleeding. SYMPTOMS  You may feel as though the world is spinning around or you are falling to the ground. Because your balance is upset, vertigo can cause nausea and vomiting. You may have involuntary eye movements (nystagmus). DIAGNOSIS  Vertigo is usually diagnosed by physical exam. If the cause of your vertigo is unknown, your caregiver may perform imaging tests, such as an MRI scan (magnetic resonance imaging). TREATMENT  Most cases of vertigo resolve on their own, without treatment. Depending on the cause, your caregiver may prescribe certain medicines. If your vertigo is related to body position issues, your caregiver may recommend movements or procedures to correct the problem. In rare cases, if  your vertigo is caused by certain inner ear problems, you may need surgery. HOME CARE INSTRUCTIONS   Follow your caregiver's instructions.  Avoid driving.  Avoid operating heavy machinery.  Avoid performing any tasks that would be dangerous to you or others during a vertigo episode.  Tell your caregiver if you notice that certain medicines seem to be causing your vertigo. Some of the medicines used to treat vertigo episodes can actually make them worse in some people. SEEK IMMEDIATE MEDICAL CARE IF:   Your medicines do not relieve your vertigo or are making it worse.  You develop problems with talking, walking, weakness, or using your arms, hands, or legs.  You develop severe headaches.  Your nausea or vomiting continues or gets worse.  You develop visual changes.  A family member notices behavioral changes.  Your condition gets worse. MAKE SURE YOU:  Understand these instructions.  Will watch your condition.  Will get help right away if you are not doing well or get worse.   This information is not intended to replace advice given to you by your health care provider. Make sure you discuss any questions you have with your health care provider.   Document Released: 05/17/2005 Document Revised: 10/30/2011 Document Reviewed: 11/30/2014 Elsevier Interactive Patient Education [employer].

## 2015-12-28 NOTE — Assessment & Plan Note (Signed)
Treat with valium PRN for dizziness. Consider vestibular rehab if symptoms do not improve.

## 2016-01-02 ENCOUNTER — Emergency Department: Payer: Medicare Other

## 2016-01-02 ENCOUNTER — Emergency Department
Admission: EM | Admit: 2016-01-02 | Discharge: 2016-01-02 | Disposition: A | Discharge status: Home / Self Care | Payer: Medicare Other | Attending: Emergency Medicine | Admitting: Emergency Medicine

## 2016-01-02 DIAGNOSIS — R079 Chest pain, unspecified: Secondary | ICD-10-CM

## 2016-01-02 DIAGNOSIS — F329 Major depressive disorder, single episode, unspecified: Secondary | ICD-10-CM | POA: Insufficient documentation

## 2016-01-02 DIAGNOSIS — M810 Age-related osteoporosis without current pathological fracture: Secondary | ICD-10-CM | POA: Insufficient documentation

## 2016-01-02 DIAGNOSIS — Z87891 Personal history of nicotine dependence: Secondary | ICD-10-CM | POA: Insufficient documentation

## 2016-01-02 LAB — TROPONIN I: Troponin I: <0.03 ng/mL (ref <0.031)

## 2016-01-02 LAB — CBC WITH DIFFERENTIAL/PLATELET
BASOS PCT: 2 %
Basophils Absolute: 0.1 10*3/uL (ref 0–0.1)
EOS ABS: 0.2 10*3/uL (ref 0–0.7)
Eosinophils Relative: 4 %
HEMATOCRIT: 45 % (ref 35.0–47.0)
Hemoglobin: 15.3 g/dL (ref 12.0–16.0)
LYMPHS ABS: 1 10*3/uL (ref 1.0–3.6)
Lymphocytes Relative: 18 %
MCH: 32 pg (ref 26.0–34.0)
MCHC: 33.9 g/dL (ref 32.0–36.0)
MCV: 94.2 fL (ref 80.0–100.0)
MONO ABS: 0.6 10*3/uL (ref 0.2–0.9)
MONOS PCT: 11 %
NEUTROS ABS: 3.8 10*3/uL (ref 1.4–6.5)
Neutrophils Relative %: 65 %
Platelets: 222 10*3/uL (ref 150–440)
RBC: 4.78 MIL/uL (ref 3.80–5.20)
RDW: 13.2 % (ref 11.5–14.5)
WBC: 5.7 10*3/uL (ref 3.6–11.0)

## 2016-01-02 LAB — BASIC METABOLIC PANEL
Anion gap: 6 (ref 5–15)
BUN: 15 mg/dL (ref 6–20)
CALCIUM: 9.4 mg/dL (ref 8.9–10.3)
CHLORIDE: 103 mmol/L (ref 101–111)
CO2: 29 mmol/L (ref 22–32)
CREATININE: 0.9 mg/dL (ref 0.44–1.00)
GFR calc non Af Amer: >60 mL/min (ref >60)
Glucose, Bld: 93 mg/dL (ref 65–99)
Potassium: 4.3 mmol/L (ref 3.5–5.1)
Sodium: 138 mmol/L (ref 135–145)

## 2016-01-02 NOTE — ED Notes (Signed)
Patient transported to X-ray 

## 2016-01-02 NOTE — Discharge Instructions (Signed)
Please seek medical attention for any high fevers, chest pain, shortness of breath, change in behavior, persistent vomiting, bloody stool or any other new or concerning symptoms. ° ° °Nonspecific Chest Pain °It is often hard to find the cause of chest pain. There is always a chance that your pain could be related to something serious, such as a heart attack or a blood clot in your lungs. Chest pain can also be caused by conditions that are not life-threatening. If you have chest pain, it is very important to follow up with your doctor. ° °HOME CARE °· If you were prescribed an antibiotic medicine, finish it all even if you start to feel better. °· Avoid any activities that cause chest pain. °· Do not use any tobacco products, including cigarettes, chewing tobacco, or electronic cigarettes. If you need help quitting, ask your doctor. °· Do not drink alcohol. °· Take medicines only as told by your doctor. °· Keep all follow-up visits as told by your doctor. This is important. This includes any further testing if your chest pain does not go away. °· Your doctor may tell you to keep your head raised (elevated) while you sleep. °· Make lifestyle changes as told by your doctor. These may include: °¨ Getting regular exercise. Ask your doctor to suggest some activities that are safe for you. °¨ Eating a heart-healthy diet. Your doctor or a diet specialist (dietitian) can help you to learn healthy eating options. °¨ Maintaining a healthy weight. °¨ Managing diabetes, if necessary. °¨ Reducing stress. °GET HELP IF: °· Your chest pain does not go away, even after treatment. °· You have a rash with blisters on your chest. °· You have a fever. °GET HELP RIGHT AWAY IF: °· Your chest pain is worse. °· You have an increasing cough, or you cough up blood. °· You have severe belly (abdominal) pain. °· You feel extremely weak. °· You pass out (faint). °· You have chills. °· You have sudden, unexplained chest discomfort. °· You have  sudden, unexplained discomfort in your arms, back, neck, or jaw. °· You have shortness of breath at any time. °· You suddenly start to sweat, or your skin gets clammy. °· You feel nauseous. °· You vomit. °· You suddenly feel light-headed or dizzy. °· Your heart begins to beat quickly, or it feels like it is skipping beats. °These symptoms may be an emergency. Do not wait to see if the symptoms will go away. Get medical help right away. Call your local emergency services (911 in the [place] Do not drive yourself to the hospital. °  °This information is not intended to replace advice given to you by your health care provider. Make sure you discuss any questions you have with your health care provider. °  °Document Released: 01/24/2008 Document Revised: 08/28/2014 Document Reviewed: 03/13/2014 °Elsevier Interactive Patient Education ©2016 Elsevier Inc. ° °

## 2016-01-02 NOTE — ED Provider Notes (Signed)
[facility] Emergency Department Provider Note   ____________________________________________  Time seen: ~1100  I have reviewed the triage vital signs and the nursing notes.   HISTORY  Chief Complaint Chest pain  History limited by: Not Limited   HPI Sara [name] is a 65 y.o. female who presented to the emergency department today because of concerns for chest pain. She describes as being located in the central and right chest. He states this is not the third time she has had this chest pain. The first time was in the middle of last month. She then had a number episode roughly 2 weeks after that. The third and final episode occurred this morning. She describes some moles being similar. She states that the pain is a pressure type pain. It is located in the central right chest. The last roughly 1 minute. It always happens when she is asleep. She does not have any associated shortness of breath with this. She has not had any associated diaphoresis. She does states she is slightly nauseous when it happens. The time of my exam she states that the pain is completely resolved. She did speak to her nurse practitioner about the center most recent visit and they have scheduled her to follow up with cardiology.   Past Medical History  Diagnosis Date  . GERD (gastroesophageal reflux disease)   . Osteoporosis   . Vascular disease     Sees Dr. Delana Meyer  . Vertigo     Last episode approx Aug 2015  . Depression   . History of blood clots   . Stomach ulcer     Patient Active Problem List   Diagnosis Date Noted  . Vertigo, benign paroxysmal 12/28/2015  . Clinical depression 09/06/2015  . History of alcoholism ([place]) 09/06/2015  . 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 Peripheral vascular disease ([place]) 09/06/2015  . Acute non-recurrent maxillary sinusitis 07/14/2015  . Vaginal pruritus 05/26/2015  . Chronic recurrent major depressive disorder ([place]) 03/09/2015  .  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 Peripheral blood vessel disorder ([place]) 03/09/2015  . Acid reflux 03/09/2015  . GERD (gastroesophageal reflux disease) 12/21/2014  . Carotid artery narrowing 01/28/2014  . Peripheral arterial occlusive disease ([place]) 06/08/2011    Past Surgical History  Procedure Laterality Date  . Vascular surgery  [id number]    Fem-Pop Bypass  . Hemorroidectomy  2014  . Esophagogastroduodenoscopy N/A 12/21/2014    Procedure: ESOPHAGOGASTRODUODENOSCOPY (EGD);  Surgeon: Lucilla Lame, MD;  Location: [place] [place];  Service: Gastroenterology;  Laterality: N/A;  . Tonsilectomy, adenoidectomy, bilateral myringotomy and tubes  1968    Current Outpatient Rx  Name  Route  Sig  Dispense  Refill  . buPROPion (WELLBUTRIN XL) 150 MG 24 hr tablet   Oral   Take 1 tablet (150 mg total) by mouth daily.   90 tablet   3   . Cholecalciferol (D 5000) 5000 UNITS TABS   Oral   Take by mouth.         . clobetasol cream (TEMOVATE) 0.05 %   Topical   Apply 1 application topically 2 (two) times daily.   30 g   0   . diazepam (VALIUM) 5 MG tablet      Take every 8 hours as needed for vertigo.   30 tablet   0   . ibandronate (BONIVA) 150 MG tablet      Take 1 tablet by mouth once a month in the morning with full glass of water on an  empty stomach ; do not lie  down for 30 minutes Patient not taking: Reported on 12/28/2015   3 tablet   3   . Melatonin 1 MG TABS   Oral   Take 1 tablet (1 mg total) by mouth Nightly.         . Multiple Vitamin (MULTIVITAMIN) capsule   Oral   Take 1 capsule by mouth daily. PM         . pantoprazole (PROTONIX) 40 MG tablet   Oral   Take 1 tablet (40 mg total) by mouth 2 (two) times daily.   180 tablet   3   . triamcinolone cream (KENALOG) 0.1 %   Topical   Apply 1 application topically 2 (two) times daily.   30 g   2   . venlafaxine XR (EFFEXOR-XR) 75 MG 24 hr capsule   Oral   Take 1 capsule (75 mg total) by mouth  daily with breakfast.   90 capsule   3     Allergies Amoxicillin and Vicodin  Family History  Problem Relation Age of Onset  . Cancer Father     colon cancer  . Heart disease Maternal Uncle     Social History Social History  Substance Use Topics  . Smoking status: Former Smoker    Quit date: 08/22/1991  . Smokeless tobacco: Not on file  . Alcohol Use: No    Review of Systems  Constitutional: Negative for fever. Cardiovascular: Positive for chest pain. Respiratory: Negative for shortness of breath. Gastrointestinal: Negative for abdominal pain, vomiting and diarrhea. Neurological: Negative for headaches, focal weakness or numbness.  10-point ROS otherwise negative.  ____________________________________________   PHYSICAL EXAM:  VITAL SIGNS:   98.3 F (36.8 C)  100  18   154/95 mmHg  93 %     Constitutional: Alert and oriented. Well appearing and in no distress. Eyes: Conjunctivae are normal. PERRL. Normal extraocular movements. ENT   Head: Normocephalic and atraumatic.   Nose: No congestion/rhinnorhea.   Mouth/Throat: Mucous membranes are moist.   Neck: No stridor. Hematological/Lymphatic/Immunilogical: No cervical lymphadenopathy. Cardiovascular: Normal rate, regular rhythm.  No murmurs, rubs, or gallops. Respiratory: Normal respiratory effort without tachypnea nor retractions. Breath sounds are clear and equal bilaterally. No wheezes/rales/rhonchi. Gastrointestinal: Soft and nontender. No distention.  Genitourinary: Deferred Musculoskeletal: Normal range of motion in all extremities. No joint effusions.  No lower extremity tenderness nor edema. Neurologic:  Normal speech and language. No gross focal neurologic deficits are appreciated.  Skin:  Skin is warm, dry and intact. No rash noted. Psychiatric: Mood and affect are normal. Speech and behavior are normal. Patient exhibits appropriate insight and  judgment.  ____________________________________________    LABS (pertinent positives/negatives)  Labs Reviewed  CBC WITH DIFFERENTIAL/PLATELET  BASIC METABOLIC PANEL  TROPONIN I  TROPONIN I     ____________________________________________   EKG  I, Nance Pear, attending physician, personally viewed and interpreted this EKG  EKG Time: 1058 Rate: 100 Rhythm: sinus tachycardia Axis: right axis deviation Intervals: qtc 421 QRS: narrow, q waves I, II ST changes: no st elevation Impression: abnormal ekg ____________________________________________    RADIOLOGY  CXR  IMPRESSION: No acute cardiopulmonary disease.  Moderate compression fracture in the region of the upper lumbar spine.  ____________________________________________   PROCEDURES  Procedure(s) performed: None  Critical Care performed: No  ____________________________________________   INITIAL IMPRESSION / ASSESSMENT AND PLAN / ED COURSE  Pertinent labs & imaging results that were available during my care of the patient were  reviewed by me and considered in my medical decision making (see chart for details).  Patient presented to the emergency department today because of an episode of chest pain. This is not the third time she has had a similar episode. Will get chest x-ray, blood work.  ----------------------------------------- 12:41 PM on 01/02/2016 -----------------------------------------  Chest x-ray without any concerning findings that explain the pain. Initial troponin negative. However given patient's history of arterial disease I would like to check a second troponin. Patient is okay with the plan.  ----------------------------------------- 3:34 PM on 01/02/2016 -----------------------------------------  Troponin was negative. Patient continued without pain here in the emergency department. No concerning findings on telemetry during her stay here in the emergency department.  Will discharge home. Patient does have follow-up appointment already scheduled with cardiology.  ____________________________________________   FINAL CLINICAL IMPRESSION(S) / ED DIAGNOSES  Final diagnoses:  Chest pain, unspecified chest pain type     Nance Pear, MD 01/02/16 1534

## 2016-01-02 NOTE — ED Notes (Signed)
Pt reports she is currently taking valium due to a hx of vertigo. Pt reports her vertigo has become worse than in the past even while taking prescribed valium.

## 2016-01-04 ENCOUNTER — Ambulatory Visit
Admission: RE | Admit: 2016-01-04 | Discharge: 2016-01-04 | Disposition: A | Discharge status: Home / Self Care | Payer: Medicare Other | Source: Ambulatory Visit | Attending: Family Medicine | Admitting: Family Medicine

## 2016-01-04 ENCOUNTER — Encounter: Payer: Self-pay | Admitting: Family Medicine

## 2016-01-04 ENCOUNTER — Ambulatory Visit (INDEPENDENT_AMBULATORY_CARE_PROVIDER_SITE_OTHER): Payer: Medicare Other | Admitting: Family Medicine

## 2016-01-04 VITALS — BP 122/74 | HR 86 | Temp 98.6°F | Resp 16 | Ht 64.0 in | Wt 145.0 lb

## 2016-01-04 DIAGNOSIS — S32000A Wedge compression fracture of unspecified lumbar vertebra, initial encounter for closed fracture: Secondary | ICD-10-CM

## 2016-01-04 DIAGNOSIS — X58XXXA Exposure to other specified factors, initial encounter: Secondary | ICD-10-CM | POA: Diagnosis not present

## 2016-01-04 DIAGNOSIS — I6521 Occlusion and stenosis of right carotid artery: Secondary | ICD-10-CM

## 2016-01-04 DIAGNOSIS — I6529 Occlusion and stenosis of unspecified carotid artery: Secondary | ICD-10-CM | POA: Insufficient documentation

## 2016-01-04 DIAGNOSIS — R079 Chest pain, unspecified: Secondary | ICD-10-CM | POA: Diagnosis not present

## 2016-01-04 MED ORDER — ATORVASTATIN CALCIUM 10 MG PO TABS: 10.0000 mg | ORAL_TABLET | Freq: Every day | ORAL | Status: DC

## 2016-01-04 NOTE — Progress Notes (Signed)
Subjective:    Patient ID: Sara [name], female    DOB: 10-18-1950, 65 y.o.   MRN: [id number]  HPI: Sara [name] is a 65 y.o. female presenting on 01/04/2016 for Dizziness   HPI  Pt presents for ER follow-up of chest pain. Seen on 5/14 for chest pain. Chest pain similar to previous chest pain- radiated from L to sternum and lasted 1 minute. ER thought it could also be esophageal spasm. ECG not ST changes. Negative troponin. Has 50% carotid stenosis R side. Last imaged last year. Seeing Dr. Alric Seton for that. No CP since ER visit.  Cardiology appt on 5/30  CXR noted a compression fracture in lumbar spine. Pt is having no back pain. Has known osteoporosis. No falls or injuries. Will image today.     Past Medical History  Diagnosis Date  . GERD (gastroesophageal reflux disease)   . Osteoporosis   . Vascular disease     Sees Dr. Delana Meyer  . Vertigo     Last episode approx Aug 2015  . Depression   . History of blood clots   . Stomach ulcer     Current Outpatient Prescriptions on File Prior to Visit  Medication Sig  . buPROPion (WELLBUTRIN XL) 150 MG 24 hr tablet Take 1 tablet (150 mg total) by mouth daily.  . Cholecalciferol (D 5000) 5000 UNITS TABS Take by mouth.  . clobetasol cream (TEMOVATE) [id number] % Apply 1 application topically 2 (two) times daily.  . diazepam (VALIUM) 5 MG tablet Take every 8 hours as needed for vertigo.  . ibandronate (BONIVA) 150 MG tablet Take 1 tablet by mouth once a month in the morning with full glass of water on an  empty stomach ; do not lie  down for 30 minutes  . Melatonin 1 MG TABS Take 1 tablet (1 mg total) by mouth Nightly.  . Multiple Vitamin (MULTIVITAMIN) capsule Take 1 capsule by mouth daily. PM  . pantoprazole (PROTONIX) 40 MG tablet Take 1 tablet (40 mg total) by mouth 2 (two) times daily.  Marland Kitchen triamcinolone cream (KENALOG) 0.1 % Apply 1 application topically 2 (two) times daily.  Marland Kitchen venlafaxine XR (EFFEXOR-XR) 75 MG 24 hr capsule Take 1 capsule  (75 mg total) by mouth daily with breakfast.   No current facility-administered medications on file prior to visit.    Review of Systems  Constitutional: Negative for fever and chills.  HENT: Negative.   Respiratory: Negative for cough, chest tightness and wheezing.   Cardiovascular: Negative for chest pain and leg swelling.  Gastrointestinal: Negative for nausea, vomiting, abdominal pain, diarrhea and constipation.  Endocrine: Negative.  Negative for cold intolerance, heat intolerance, polydipsia, polyphagia and polyuria.  Genitourinary: Negative for dysuria and difficulty urinating.  Musculoskeletal: Negative.   Neurological: Negative for dizziness, light-headedness and numbness.  Psychiatric/Behavioral: Negative.    Per HPI unless specifically indicated above     Objective:    BP 122/74 mmHg  Pulse 86  Temp(Src) 98.6 F (37 C) (Oral)  Resp 16  Ht 5\' 4"  (1.626 m)  Wt 145 lb (65.772 kg)  BMI 24.88 kg/m2  Wt Readings from Last 3 Encounters:  01/04/16 145 lb (65.772 kg)  01/02/16 140 lb (63.504 kg)  12/28/15 148 lb (67.132 kg)    Physical Exam  Constitutional: She is oriented to person, place, and time. She appears well-developed and well-nourished.  HENT:  Head: Normocephalic and atraumatic.  Neck: Neck supple.  Cardiovascular: Normal rate, regular rhythm and normal heart sounds.  Exam  reveals no gallop and no friction rub.   No murmur heard. Pulmonary/Chest: Effort normal and breath sounds normal. She has no wheezes. She exhibits no tenderness.  Abdominal: Soft. Normal appearance and bowel sounds are normal. She exhibits no distension and no mass. There is no tenderness. There is no rebound and no guarding.  Musculoskeletal: Normal range of motion. She exhibits no edema or tenderness.  Lymphadenopathy:    She has no cervical adenopathy.  Neurological: She is alert and oriented to person, place, and time.  Skin: Skin is warm and dry.   Results for orders placed or  performed during the hospital encounter of 01/02/16  CBC with Differential  Result Value Ref Range   WBC 5.7 3.6 - 11.0 K/uL   RBC 4.78 3.80 - 5.20 MIL/uL   Hemoglobin 15.3 12.0 - 16.0 g/dL   HCT 45.0 35.0 - 47.0 %   MCV 94.2 80.0 - 100.0 fL   MCH 32.0 26.0 - 34.0 pg   MCHC 33.9 32.0 - 36.0 g/dL   RDW 13.2 11.5 - 14.5 %   Platelets 222 150 - 440 K/uL   Neutrophils Relative % 65 %   Neutro Abs 3.8 1.4 - 6.5 K/uL   Lymphocytes Relative 18 %   Lymphs Abs 1.0 1.0 - 3.6 K/uL   Monocytes Relative 11 %   Monocytes Absolute 0.6 0.2 - 0.9 K/uL   Eosinophils Relative 4 %   Eosinophils Absolute 0.2 0 - 0.7 K/uL   Basophils Relative 2 %   Basophils Absolute 0.1 0 - 0.1 K/uL  Basic metabolic panel  Result Value Ref Range   Sodium 138 135 - 145 mmol/L   Potassium 4.3 3.5 - 5.1 mmol/L   Chloride 103 101 - 111 mmol/L   CO2 29 22 - 32 mmol/L   Glucose, Bld 93 65 - 99 mg/dL   BUN 15 6 - 20 mg/dL   Creatinine, Ser 0.90 0.44 - 1.00 mg/dL   Calcium 9.4 8.9 - 10.3 mg/dL   GFR calc non Af Amer >60 >60 mL/min   GFR calc Af Amer >60 >60 mL/min   Anion gap 6 5 - 15  Troponin I  Result Value Ref Range   Troponin I <0.03 <0.031 ng/mL  Troponin I  Result Value Ref Range   Troponin I <0.03 <0.031 ng/mL      Assessment & Plan:   Problem List Items Addressed This Visit      Cardiovascular and Mediastinum   Carotid artery stenosis    Being managed by vein and vascular. 50% per patient. Imaged last year. Not currently on aspirin or statin. Her LDL was <70 in January. Consider low dose statin today to continue to lower risks.       Relevant Medications   atorvastatin (LIPITOR) 10 MG tablet    Other Visit Diagnoses    Chest pain, unspecified chest pain type    -  Primary    Cardiac work-up in ER negative. Consider history of carotid stenosis and PAD- will continue with cardiology evaluation.     Lumbar compression fracture, closed, initial encounter ([place])        Found on CXR. Will imaged today  to determine age and extent. Pt having no symptoms. Will update DXA scan.     Relevant Orders    DG Lumbar Spine 2-3 Views (Completed)       Meds ordered this encounter  Medications  . atorvastatin (LIPITOR) 10 MG tablet    Sig: Take 1  tablet (10 mg total) by mouth daily.    Dispense:  30 tablet    Refill:  3    Order Specific Question:  Supervising Provider    Answer:  Arlis Porta [id number]      Follow up plan: Return if symptoms worsen or fail to improve, for keep as scheduled. Marland Kitchen

## 2016-01-04 NOTE — Patient Instructions (Addendum)
Bone density scan:  [facility]: [address], [place], [place] [address]  Phone: [phone]  We will get an XR for your lumbar spine to determine if there is a compression fracture.   Chest pain: We will plan to have you see cardiology to follow-up on symptoms. If you experience worsening chest pain, shortness of breath, or other concerning symptoms go to ER.

## 2016-01-04 NOTE — Assessment & Plan Note (Signed)
Being managed by vein and vascular. 50% per patient. Imaged last year. Not currently on aspirin or statin. Her LDL was <70 in January. Consider low dose statin today to continue to lower risks.

## 2016-01-05 ENCOUNTER — Telehealth: Payer: Self-pay | Admitting: *Deleted

## 2016-01-05 NOTE — Telephone Encounter (Signed)
Patient r/t call and is aware.[place]

## 2016-01-05 NOTE — Telephone Encounter (Signed)
Called patient to make her aware xray normal and results were released to MyChart. Also patient may call [facility] [phone] to schedule bone density.

## 2016-01-06 LAB — HM DEXA SCAN

## 2016-01-13 ENCOUNTER — Encounter: Payer: Self-pay | Admitting: Family Medicine

## 2016-01-13 ENCOUNTER — Telehealth: Payer: Self-pay | Admitting: *Deleted

## 2016-01-13 NOTE — Telephone Encounter (Signed)
Bone density impression: Osteoporosis.

## 2016-01-18 ENCOUNTER — Ambulatory Visit: Payer: Medicare Other | Admitting: Cardiology

## 2016-01-27 ENCOUNTER — Ambulatory Visit (INDEPENDENT_AMBULATORY_CARE_PROVIDER_SITE_OTHER): Payer: Medicare Other | Admitting: Cardiology

## 2016-01-27 ENCOUNTER — Encounter: Payer: Self-pay | Admitting: Cardiology

## 2016-01-27 VITALS — BP 140/68 | HR 94 | Ht 64.0 in | Wt 145.0 lb

## 2016-01-27 DIAGNOSIS — E785 Hyperlipidemia, unspecified: Secondary | ICD-10-CM

## 2016-01-27 DIAGNOSIS — R0789 Other chest pain: Secondary | ICD-10-CM

## 2016-01-27 DIAGNOSIS — I739 Peripheral vascular disease, unspecified: Secondary | ICD-10-CM | POA: Diagnosis not present

## 2016-01-27 DIAGNOSIS — R079 Chest pain, unspecified: Secondary | ICD-10-CM

## 2016-01-27 MED ORDER — NITROGLYCERIN 0.4 MG SL SUBL: 0.4000 mg | SUBLINGUAL_TABLET | SUBLINGUAL | Status: DC | PRN

## 2016-01-27 NOTE — Patient Instructions (Addendum)
Medication Instructions:  Your physician has recommended you make the following change in your medication:  1. Nitroglycerin 0.4 mg under the tongue for chest pain. Please read below before taking first doses.    Labwork: None ordered  Testing/Procedures: Your physician has requested that you have an echocardiogram. Echocardiography is a painless test that uses sound waves to create images of your heart. It provides your doctor with information about the size and shape of your heart and how well your heart's chambers and valves are working. This procedure takes approximately one hour. There are no restrictions for this procedure.  Date & Time:______________________________________________________  [place]  Your caregiver has ordered a Stress Test with nuclear imaging. The purpose of this test is to evaluate the blood supply to your heart muscle. This procedure is referred to as a "Non-Invasive Stress Test." This is because other than having an IV started in your vein, nothing is inserted or "invades" your body. Cardiac stress tests are done to find areas of poor blood flow to the heart by determining the extent of coronary artery disease (CAD). Some patients exercise on a treadmill, which naturally increases the blood flow to your heart, while others who are  unable to walk on a treadmill due to physical limitations have a pharmacologic/chemical stress agent called Lexiscan . This medicine will mimic walking on a treadmill by temporarily increasing your coronary blood flow.   Please note: these test may take anywhere between 2-4 hours to complete  PLEASE REPORT TO [place] AT THE FIRST DESK WILL DIRECT YOU WHERE TO GO  Date of Procedure:__Thursday February 10, 2016 at 08:00AM______  Arrival Time for Procedure:____Arrive at 07:45AM to register______    PLEASE NOTIFY THE OFFICE AT LEAST 24 HOURS IN ADVANCE IF YOU ARE UNABLE TO [place].   [phone] AND  PLEASE NOTIFY NUCLEAR MEDICINE AT [facility] AT LEAST 24 HOURS IN ADVANCE IF YOU ARE UNABLE TO KEEP YOUR APPOINTMENT. [phone]  How to prepare for your Myoview test:   Do not eat or drink after midnight  No caffeine for 24 hours prior to test  No smoking 24 hours prior to test.  Your medication may be taken with water.  If your doctor stopped a medication because of this test, do not take that medication.  Ladies, please do not wear dresses.  Skirts or pants are appropriate. Please wear a short sleeve shirt.  No perfume, cologne or lotion.  Wear comfortable walking shoes. No heels!   Follow-Up: Your physician recommends that you schedule a follow-up appointment after testing to review results with Dr. Yvone Neu.  Date & Time: ___________________________________________   Any Other Special Instructions Will Be Listed Below (If Applicable).     If you need a refill on your cardiac medications before your next appointment, please call your pharmacy.  Echocardiogram An echocardiogram, or echocardiography, uses sound waves (ultrasound) to produce an image of your heart. The echocardiogram is simple, painless, obtained within a short period of time, and offers valuable information to your health care provider. The images from an echocardiogram can provide information such as:  Evidence of coronary artery disease (CAD).  Heart size.  Heart muscle function.  Heart valve function.  Aneurysm detection.  Evidence of a past heart attack.  Fluid buildup around the heart.  Heart muscle thickening.  Assess heart valve function. LET [facility] CARE PROVIDER KNOW ABOUT:  Any allergies you have.  All medicines you are taking, including vitamins,  herbs, eye drops, creams, and over-the-counter medicines.  Previous problems you or members of your family have had with the use of anesthetics.  Any blood disorders you have.  Previous surgeries you have  had.  Medical conditions you have.  Possibility of pregnancy, if this applies. BEFORE THE PROCEDURE  No special preparation is needed. Eat and drink normally.  PROCEDURE   In order to produce an image of your heart, gel will be applied to your chest and a wand-like tool (transducer) will be moved over your chest. The gel will help transmit the sound waves from the transducer. The sound waves will harmlessly bounce off your heart to allow the heart images to be captured in real-time motion. These images will then be recorded.  You may need an IV to receive a medicine that improves the quality of the pictures. AFTER THE PROCEDURE You may return to your normal schedule including diet, activities, and medicines, unless your health care provider tells you otherwise.   This information is not intended to replace advice given to you by your health care provider. Make sure you discuss any questions you have with your health care provider.   Document Released: 08/04/2000 Document Revised: 08/28/2014 Document Reviewed: 04/14/2013 Elsevier Interactive Patient Education 2016 [place].   Pharmacologic Stress Electrocardiogram A pharmacologic stress electrocardiogram is a heart (cardiac) test that uses nuclear imaging to evaluate the blood supply to your heart. This test may also be called a pharmacologic stress electrocardiography. Pharmacologic means that a medicine is used to increase your heart rate and blood pressure.  This stress test is done to find areas of poor blood flow to the heart by determining the extent of coronary artery disease (CAD). Some people exercise on a treadmill, which naturally increases the blood flow to the heart. For those people unable to exercise on a treadmill, a medicine is used. This medicine stimulates your heart and will cause your heart to beat harder and more quickly, as if you were exercising.  Pharmacologic stress tests can help determine:  The adequacy of  blood flow to your heart during increased levels of activity in order to clear you for discharge home.  The extent of coronary artery blockage caused by CAD.  Your prognosis if you have suffered a heart attack.  The effectiveness of cardiac procedures done, such as an angioplasty, which can increase the circulation in your coronary arteries.  Causes of chest pain or pressure. LET [facility] CARE PROVIDER KNOW ABOUT:  Any allergies you have.  All medicines you are taking, including vitamins, herbs, eye drops, creams, and over-the-counter medicines.  Previous problems you or members of your family have had with the use of anesthetics.  Any blood disorders you have.  Previous surgeries you have had.  Medical conditions you have.  Possibility of pregnancy, if this applies.  If you are currently breastfeeding. RISKS AND COMPLICATIONS Generally, this is a safe procedure. However, as with any procedure, complications can occur. Possible complications include:  You develop pain or pressure in the following areas:  Chest.  Jaw or neck.  Between your shoulder blades.  Radiating down your left arm.  Headache.  Dizziness or light-headedness.  Shortness of breath.  Increased or irregular heartbeat.  Low blood pressure.  Nausea or vomiting.  Flushing.  Redness going up the arm and slight pain during injection of medicine.  Heart attack (rare). BEFORE THE PROCEDURE   Avoid all forms of caffeine for 24 hours before your test or as  directed by your health care provider. This includes coffee, tea (even decaffeinated tea), caffeinated sodas, chocolate, cocoa, and certain pain medicines.  Follow your health care provider's instructions regarding eating and drinking before the test.  Take your medicines as directed at regular times with water unless instructed otherwise. Exceptions may include:  If you have diabetes, ask how you are to take your insulin or pills. It is  common to adjust insulin dosing the morning of the test.  If you are taking beta-blocker medicines, it is important to talk to your health care provider about these medicines well before the date of your test. Taking beta-blocker medicines may interfere with the test. In some cases, these medicines need to be changed or stopped 24 hours or more before the test.  If you wear a nitroglycerin patch, it may need to be removed prior to the test. Ask your health care provider if the patch should be removed before the test.  If you use an inhaler for any breathing condition, bring it with you to the test.  If you are an outpatient, bring a snack so you can eat right after the stress phase of the test.  Do not smoke for 4 hours prior to the test or as directed by your health care provider.  Do not apply lotions, powders, creams, or oils on your chest prior to the test.  Wear comfortable shoes and clothing. Let your health care provider know if you were unable to complete or follow the preparations for your test. PROCEDURE   Multiple patches (electrodes) will be put on your chest. If needed, small areas of your chest may be shaved to get better contact with the electrodes. Once the electrodes are attached to your body, multiple wires will be attached to the electrodes, and your heart rate will be monitored.  An IV access will be started. A nuclear trace (isotope) is given. The isotope may be given intravenously, or it may be swallowed. Nuclear refers to several types of radioactive isotopes, and the nuclear isotope lights up the arteries so that the nuclear images are clear. The isotope is absorbed by your body. This results in low radiation exposure.  A resting nuclear image is taken to show how your heart functions at rest.  A medicine is given through the IV access.  A second scan is done about 1 hour after the medicine injection and determines how your heart functions under stress.  During  this stress phase, you will be connected to an electrocardiogram machine. Your blood pressure and oxygen levels will be monitored. AFTER THE PROCEDURE   Your heart rate and blood pressure will be monitored after the test.  You may return to your normal schedule, including diet,activities, and medicines, unless your health care provider tells you otherwise.   This information is not intended to replace advice given to you by your health care provider. Make sure you discuss any questions you have with your health care provider.   Document Released: 12/24/2008 Document Revised: 08/12/2013 Document Reviewed: 04/14/2013 Elsevier Interactive Patient Education 2016 Elsevier Inc[employer]   Nitroglycerin sublingual tablets What is this medicine? NITROGLYCERIN (nye troe GLI ser in) is a type of vasodilator. It relaxes blood vessels, increasing the blood and oxygen supply to your heart. This medicine is used to relieve chest pain caused by angina. It is also used to prevent chest pain before activities like climbing stairs, going outdoors in cold weather, or sexual activity. This medicine may be used for other  purposes; ask your health care provider or pharmacist if you have questions. What should I tell my health care provider before I take this medicine? They need to know if you have any of these conditions: -anemia -head injury, recent stroke, or bleeding in the brain -liver disease -previous heart attack -an unusual or allergic reaction to nitroglycerin, other medicines, foods, dyes, or preservatives -pregnant or trying to get pregnant -breast-feeding How should I use this medicine? Take this medicine by mouth as needed. At the first sign of an angina attack (chest pain or tightness) place one tablet under your tongue. You can also take this medicine 5 to 10 minutes before an event likely to produce chest pain. Follow the directions on the prescription label. Let the tablet dissolve under the tongue.  Do not swallow whole. Replace the dose if you accidentally swallow it. It will help if your mouth is not dry. Saliva around the tablet will help it to dissolve more quickly. Do not eat or drink, smoke or chew tobacco while a tablet is dissolving. If you are not better within 5 minutes after taking ONE dose of nitroglycerin, call 9-1-1 immediately to seek emergency medical care. Do not take more than 3 nitroglycerin tablets over 15 minutes. If you take this medicine often to relieve symptoms of angina, your doctor or health care professional may provide you with different instructions to manage your symptoms. If symptoms do not go away after following these instructions, it is important to call 9-1-1 immediately. Do not take more than 3 nitroglycerin tablets over 15 minutes. Talk to your pediatrician regarding the use of this medicine in children. Special care may be needed. Overdosage: If you think you have taken too much of this medicine contact a poison control center or emergency room at once. NOTE: This medicine is only for you. Do not share this medicine with others. What if I miss a dose? This does not apply. This medicine is only used as needed. What may interact with this medicine? Do not take this medicine with any of the following medications: -certain migraine medicines like ergotamine and dihydroergotamine (DHE) -medicines used to treat erectile dysfunction like sildenafil, tadalafil, and vardenafil -riociguat This medicine may also interact with the following medications: -alteplase -aspirin -heparin -medicines for high blood pressure -medicines for mental depression -other medicines used to treat angina -phenothiazines like chlorpromazine, mesoridazine, prochlorperazine, thioridazine This list may not describe all possible interactions. Give your health care provider a list of all the medicines, herbs, non-prescription drugs, or dietary supplements you use. Also tell them if you  smoke, drink alcohol, or use illegal drugs. Some items may interact with your medicine. What should I watch for while using this medicine? Tell your doctor or health care professional if you feel your medicine is no longer working. Keep this medicine with you at all times. Sit or lie down when you take your medicine to prevent falling if you feel dizzy or faint after using it. Try to remain calm. This will help you to feel better faster. If you feel dizzy, take several deep breaths and lie down with your feet propped up, or bend forward with your head resting between your knees. You may get drowsy or dizzy. Do not drive, use machinery, or do anything that needs mental alertness until you know how this drug affects you. Do not stand or sit up quickly, especially if you are an older patient. This reduces the risk of dizzy or fainting spells. Alcohol can make  you more drowsy and dizzy. Avoid alcoholic drinks. Do not treat yourself for coughs, colds, or pain while you are taking this medicine without asking your doctor or health care professional for advice. Some ingredients may increase your blood pressure. What side effects may I notice from receiving this medicine? Side effects that you should report to your doctor or health care professional as soon as possible: -blurred vision -dry mouth -skin rash -sweating -the feeling of extreme pressure in the head -unusually weak or tired Side effects that usually do not require medical attention (report to your doctor or health care professional if they continue or are bothersome): -flushing of the face or neck -headache -irregular heartbeat, palpitations -nausea, vomiting This list may not describe all possible side effects. Call your doctor for medical advice about side effects. You may report side effects to FDA at 1-800-FDA-1088. Where should I keep my medicine? Keep out of the reach of children. Store at room temperature between 20 and 25 degrees C  (68 and 77 degrees F). Store in [employer]. Protect from light and moisture. Keep tightly closed. Throw away any unused medicine after the expiration date. NOTE: This sheet is a summary. It may not cover all possible information. If you have questions about this medicine, talk to your doctor, pharmacist, or health care provider.    2016, Elsevier/Gold Standard. (2013-06-05 17:57:36)

## 2016-01-27 NOTE — Progress Notes (Signed)
Cardiology Office Note   Date:  01/27/2016   ID:  Kao Mckeone, DOB [birth year], MRN [id number]  Referring Doctor:  Leata Mouse, NP   Cardiologist:   Wende Bushy, MD   Reason for consultation:  Chief Complaint  Patient presents with  . New Patient (Initial Visit)  . Chest Pain      History of Present Illness: Sara [name] is a 65 y.o. female who presents for Chest pain  Recently in the last several weeks, patient has had 4-5 episodes of significant chest pressure. This is described as coming from the left side of the chest going and radiating into the center of the chest, sensation, severe intensity, lasting a minute at a time, associated with nausea. Most of the episodes wake her up from sleep. She is concerned for heart disease because she already knows that she has vascular disease in the legs as well as carotid artery disease.  She denies shortness breath, palpitations. No fever, cough, colds, abdominal pain. No PND, orthopnea. She has occasional leg edema after having bypass in her legs.   ROS:  Please see the history of present illness. Aside from mentioned under HPI, all other systems are reviewed and negative.     Past Medical History  Diagnosis Date  . GERD (gastroesophageal reflux disease)   . Osteoporosis   . Vascular disease     Sees Dr. Delana Meyer  . Vertigo     Last episode approx Aug 2015  . Depression   . History of blood clots   . Stomach ulcer     Past Surgical History  Procedure Laterality Date  . Vascular surgery  [id number]    Fem-Pop Bypass  . Hemorroidectomy  2014  . Esophagogastroduodenoscopy N/A 12/21/2014    Procedure: ESOPHAGOGASTRODUODENOSCOPY (EGD);  Surgeon: Lucilla Lame, MD;  Location: [facility];  Service: Gastroenterology;  Laterality: N/A;  . Tonsilectomy, adenoidectomy, bilateral myringotomy and tubes  1968     reports that she quit smoking about 24 years ago. She does not have any smokeless tobacco history on file. She  reports that she does not drink alcohol or use illicit drugs.   family history includes Cancer in her father; Heart disease in her maternal uncle. Premature CAD in the family  Current Outpatient Prescriptions  Medication Sig Dispense Refill  . atorvastatin (LIPITOR) 10 MG tablet Take 1 tablet (10 mg total) by mouth daily. 30 tablet 3  . buPROPion (WELLBUTRIN XL) 150 MG 24 hr tablet Take 1 tablet (150 mg total) by mouth daily. 90 tablet 3  . Calcium Carbonate-Vitamin D (CALCIUM-VITAMIN D) 500-200 MG-UNIT tablet Take 3 tablets by mouth daily.    . clobetasol cream (TEMOVATE) [id number] % Apply 1 application topically 2 (two) times daily. 30 g 0  . Melatonin 1 MG TABS Take 1 tablet (1 mg total) by mouth Nightly.    . Multiple Vitamin (MULTIVITAMIN) capsule Take 1 capsule by mouth daily. PM    . pantoprazole (PROTONIX) 40 MG tablet Take 1 tablet (40 mg total) by mouth 2 (two) times daily. 180 tablet 3  . triamcinolone cream (KENALOG) 0.1 % Apply 1 application topically 2 (two) times daily. 30 g 2  . venlafaxine XR (EFFEXOR-XR) 75 MG 24 hr capsule Take 1 capsule (75 mg total) by mouth daily with breakfast. 90 capsule 3  . nitroGLYCERIN (NITROSTAT) 0.4 MG SL tablet Place 1 tablet (0.4 mg total) under the tongue every 5 (five) minutes as needed for chest pain. 25 tablet 6  No current facility-administered medications for this visit.    Allergies: Amoxicillin and Vicodin    PHYSICAL EXAM: VS:  BP 140/68 mmHg  Pulse 94  Ht 5\' 4"  (1.626 m)  Wt 145 lb (65.772 kg)  BMI 24.88 kg/m2 , Body mass index is 24.88 kg/(m^2). Wt Readings from Last 3 Encounters:  01/27/16 145 lb (65.772 kg)  01/04/16 145 lb (65.772 kg)  01/02/16 140 lb (63.504 kg)    GENERAL:  well developed, well nourished, obese, not in acute distress HEENT: normocephalic, pink conjunctivae, anicteric sclerae, no xanthelasma, normal dentition, oropharynx clear NECK:  no neck vein engorgement, JVP normal, no hepatojugular reflux, carotid  upstroke brisk and symmetric, no bruit, no thyromegaly, no lymphadenopathy LUNGS:  good respiratory effort, clear to auscultation bilaterally CV:  PMI not displaced, no thrills, no lifts, S1 and S2 within normal limits, no palpable S3 or S4, no murmurs, no rubs, no gallops ABD:  Soft, nontender, nondistended, normoactive bowel sounds, no abdominal aortic bruit, no hepatomegaly, no splenomegaly MS: nontender back, no kyphosis, no scoliosis, no joint deformities EXT:  2+ DP/PT pulses, no edema, no varicosities, no cyanosis, no clubbing SKIN: warm, nondiaphoretic, normal turgor, no ulcers NEUROPSYCH: alert, oriented to person, place, and time, sensory/motor grossly intact, normal mood, appropriate affect  Recent Labs: 09/21/2015: ALT 19 01/02/2016: BUN 15; Creatinine, Ser 0.90; Hemoglobin 15.3; Platelets 222; Potassium 4.3; Sodium 138   Lipid Panel    Component Value Date/Time   CHOL 164 09/21/2015 0916   TRIG 57 09/21/2015 0916   HDL 85 09/21/2015 0916   CHOLHDL 1.9 09/21/2015 0916   LDLCALC 68 09/21/2015 0916     Other studies Reviewed:  EKG:  The ekg from 01/27/2016 was personally reviewed by me and it revealed sinus rhythm, 92 BPM  Additional studies/ records that were reviewed personally reviewed by me today include: None available   ASSESSMENT AND PLAN: Chest pain, concerning for angina. Multiple risk factors for CAD including peripheral vascular disease, carotid artery disease, age, previous smoking history, family history of premature CAD Recommend ischemic evaluation with pharmacologic nuclear stress test. Patient unable to walk the treadmill. Recommend echocardiogram. Patient prescribed  and NTG SL prn for chest pain. Patient instructed to call 911 for unrelenting chest pain. Recommend aspirin the patient unwilling to take this as she has significant GI irritation from aspirin and has a history of bleeding ulcers.  Hyperlipidemia LDL goal is less than 70  PVD Carotid  artery disease Patient follows up with vascular surgery  Family history of premature CAD   Current medicines are reviewed at length with the patient today.  The patient does not have concerns regarding medicines.  Labs/ tests ordered today include:  Orders Placed This Encounter  Procedures  . NM Myocar Multi W/Spect W/Wall Motion / EF  . EKG 12-Lead  . ECHOCARDIOGRAM COMPLETE    I had a lengthy and detailed discussion with the patient regarding diagnoses, prognosis, diagnostic options, treatment options  and side effects of medications.   I counseled the patient on importance of lifestyle modification including heart healthy diet, regular physical activity    Disposition:   FU with undersigned after tests   Signed, Wende Bushy, MD  01/27/2016 1:37 PM    [place]

## 2016-01-31 ENCOUNTER — Encounter: Payer: Self-pay | Admitting: Family Medicine

## 2016-01-31 ENCOUNTER — Ambulatory Visit (INDEPENDENT_AMBULATORY_CARE_PROVIDER_SITE_OTHER): Payer: Medicare Other | Admitting: Family Medicine

## 2016-01-31 VITALS — BP 119/74 | HR 89 | Temp 98.4°F | Resp 16 | Ht 64.0 in | Wt 146.0 lb

## 2016-01-31 DIAGNOSIS — M810 Age-related osteoporosis without current pathological fracture: Secondary | ICD-10-CM

## 2016-01-31 DIAGNOSIS — J40 Bronchitis, not specified as acute or chronic: Secondary | ICD-10-CM

## 2016-01-31 MED ORDER — PREDNISONE 20 MG PO TABS: 40.0000 mg | ORAL_TABLET | Freq: Every day | ORAL | Status: DC

## 2016-01-31 MED ORDER — DENOSUMAB 60 MG/ML ~~LOC~~ SOLN
60.0000 mg | SUBCUTANEOUS | Status: DC
Start: 2016-01-31 — End: 2016-04-20

## 2016-01-31 MED ORDER — ALBUTEROL SULFATE HFA 108 (90 BASE) MCG/ACT IN AERS: 2.0000 | INHALATION_SPRAY | Freq: Four times a day (QID) | RESPIRATORY_TRACT | Status: DC | PRN

## 2016-01-31 MED ORDER — DM-GUAIFENESIN ER 30-600 MG PO TB12: 1.0000 | ORAL_TABLET | Freq: Two times a day (BID) | ORAL | Status: DC

## 2016-01-31 MED ORDER — BENZONATATE 100 MG PO CAPS: 100.0000 mg | ORAL_CAPSULE | Freq: Three times a day (TID) | ORAL | Status: DC | PRN

## 2016-01-31 NOTE — Patient Instructions (Addendum)
You can use supportive care at home to help with your symptoms. I have sent Mucinex DM to your pharmacy to help break up the congestion and soothe your cough. You can takes this twice daily.  I have also sent tesslon perles to your pharmacy to help with the cough- you can take these 3 times daily as needed. Honey is a natural cough suppressant- so add it to your tea in the morning.  If you have a humidifer, set that up in your bedroom at night.   Please seek immediate medical attention if you develop shortness of breath not relieve by inhaler, chest pain/tightness, fever > 103 F or other concerning symptoms.    Denosumab injection What is this medicine? DENOSUMAB (den oh sue mab) slows bone breakdown. Prolia is used to treat osteoporosis in women after menopause and in men. Delton See is used to prevent bone fractures and other bone problems caused by cancer bone metastases. Delton See is also used to treat giant cell tumor of the bone. This medicine may be used for other purposes; ask your health care provider or pharmacist if you have questions. What should I tell my health care provider before I take this medicine? They need to know if you have any of these conditions: -dental disease -eczema -infection or history of infections -kidney disease or on dialysis -low blood calcium or vitamin D -malabsorption syndrome -scheduled to have surgery or tooth extraction -taking medicine that contains denosumab -thyroid or parathyroid disease -an unusual reaction to denosumab, other medicines, foods, dyes, or preservatives -pregnant or trying to get pregnant -breast-feeding How should I use this medicine? This medicine is for injection under the skin. It is given by a health care professional in a hospital or clinic setting. If you are getting Prolia, a special MedGuide will be given to you by the pharmacist with each prescription and refill. Be sure to read this information carefully each time. For Prolia,  talk to your pediatrician regarding the use of this medicine in children. Special care may be needed. For Delton See, talk to your pediatrician regarding the use of this medicine in children. While this drug may be prescribed for children as young as 13 years for selected conditions, precautions do apply. Overdosage: If you think you have taken too much of this medicine contact a poison control center or emergency room at once. NOTE: This medicine is only for you. Do not share this medicine with others. What if I miss a dose? It is important not to miss your dose. Call your doctor or health care professional if you are unable to keep an appointment. What may interact with this medicine? Do not take this medicine with any of the following medications: -other medicines containing denosumab This medicine may also interact with the following medications: -medicines that suppress the immune system -medicines that treat cancer -steroid medicines like prednisone or cortisone This list may not describe all possible interactions. Give your health care provider a list of all the medicines, herbs, non-prescription drugs, or dietary supplements you use. Also tell them if you smoke, drink alcohol, or use illegal drugs. Some items may interact with your medicine. What should I watch for while using this medicine? Visit your doctor or health care professional for regular checks on your progress. Your doctor or health care professional may order blood tests and other tests to see how you are doing. Call your doctor or health care professional if you get a cold or other infection while receiving this medicine.  Do not treat yourself. This medicine may decrease your body's ability to fight infection. You should make sure you get enough calcium and vitamin D while you are taking this medicine, unless your doctor tells you not to. Discuss the foods you eat and the vitamins you take with your health care professional. See  your dentist regularly. Brush and floss your teeth as directed. Before you have any dental work done, tell your dentist you are receiving this medicine. Do not become pregnant while taking this medicine or for 5 months after stopping it. Women should inform their doctor if they wish to become pregnant or think they might be pregnant. There is a potential for serious side effects to an unborn child. Talk to your health care professional or pharmacist for more information. What side effects may I notice from receiving this medicine? Side effects that you should report to your doctor or health care professional as soon as possible: -allergic reactions like skin rash, itching or hives, swelling of the face, lips, or tongue -breathing problems -chest pain -fast, irregular heartbeat -feeling faint or lightheaded, falls -fever, chills, or any other sign of infection -muscle spasms, tightening, or twitches -numbness or tingling -skin blisters or bumps, or is dry, peels, or red -slow healing or unexplained pain in the mouth or jaw -unusual bleeding or bruising Side effects that usually do not require medical attention (Report these to your doctor or health care professional if they continue or are bothersome.): -muscle pain -stomach upset, gas This list may not describe all possible side effects. Call your doctor for medical advice about side effects. You may report side effects to FDA at 1-800-FDA-1088. Where should I keep my medicine? This medicine is only given in a clinic, doctor's office, or other health care setting and will not be stored at home. NOTE: This sheet is a summary. It may not cover all possible information. If you have questions about this medicine, talk to your doctor, pharmacist, or health care provider.    2016, [employer]. (2012-02-05 12:37:47)

## 2016-01-31 NOTE — Progress Notes (Signed)
Subjective:    Patient ID: Sara [name], female    DOB: 1951-05-21, 65 y.o.   MRN: [id number]  HPI: Sara [name] is a 65 y.o. female presenting on 01/31/2016 for URI   HPI  Pt presents for upper respiratory symptoms that started 4 days ago. Started with sore throat and moved down into chest. No nasal congestion. Chest tightness. No shortness of breath. No trouble breathing. Cough is dry. Home treatment: Mucinex has helped with coughing.  Pt had recent bone density scan- still osteoporotic. Unable to tolerate fosamax orally. Did not tolerate boniva either. Would like to discuss other options for treatment.    Past Medical History  Diagnosis Date  . GERD (gastroesophageal reflux disease)   . Osteoporosis   . Vascular disease     Sees Dr. Delana Meyer  . Vertigo     Last episode approx Aug 2015  . Depression   . History of blood clots   . Stomach ulcer     Current Outpatient Prescriptions on File Prior to Visit  Medication Sig  . atorvastatin (LIPITOR) 10 MG tablet Take 1 tablet (10 mg total) by mouth daily.  Marland Kitchen buPROPion (WELLBUTRIN XL) 150 MG 24 hr tablet Take 1 tablet (150 mg total) by mouth daily.  . Calcium Carbonate-Vitamin D (CALCIUM-VITAMIN D) 500-200 MG-UNIT tablet Take 3 tablets by mouth daily.  . clobetasol cream (TEMOVATE) [id number] % Apply 1 application topically 2 (two) times daily.  . Melatonin 1 MG TABS Take 1 tablet (1 mg total) by mouth Nightly.  . Multiple Vitamin (MULTIVITAMIN) capsule Take 1 capsule by mouth daily. PM  . nitroGLYCERIN (NITROSTAT) 0.4 MG SL tablet Place 1 tablet (0.4 mg total) under the tongue every 5 (five) minutes as needed for chest pain.  . pantoprazole (PROTONIX) 40 MG tablet Take 1 tablet (40 mg total) by mouth 2 (two) times daily.  Marland Kitchen triamcinolone cream (KENALOG) 0.1 % Apply 1 application topically 2 (two) times daily.  Marland Kitchen venlafaxine XR (EFFEXOR-XR) 75 MG 24 hr capsule Take 1 capsule (75 mg total) by mouth daily with breakfast.   No current  facility-administered medications on file prior to visit.    Review of Systems  Constitutional: Negative for fever and chills.  HENT: Positive for sore throat. Negative for congestion and sinus pressure.   Respiratory: Positive for cough, chest tightness and wheezing.   Cardiovascular: Negative for chest pain and leg swelling.  Gastrointestinal: Negative for nausea, vomiting, abdominal pain, diarrhea and constipation.  Endocrine: Negative.  Negative for cold intolerance, heat intolerance, polydipsia, polyphagia and polyuria.  Genitourinary: Negative for dysuria and difficulty urinating.  Musculoskeletal: Negative.   Neurological: Negative for dizziness, light-headedness and numbness.  Psychiatric/Behavioral: Negative.    Per HPI unless specifically indicated above     Objective:    BP 119/74 mmHg  Pulse 89  Temp(Src) 98.4 F (36.9 C) (Oral)  Resp 16  Ht 5\' 4"  (1.626 m)  Wt 146 lb (66.225 kg)  BMI 25.05 kg/m2  SpO2 97%  Wt Readings from Last 3 Encounters:  01/31/16 146 lb (66.225 kg)  01/27/16 145 lb (65.772 kg)  01/04/16 145 lb (65.772 kg)    Physical Exam  Constitutional: She is oriented to person, place, and time. She appears well-developed and well-nourished.  HENT:  Head: Normocephalic and atraumatic.  Right Ear: Hearing and tympanic membrane normal.  Left Ear: Hearing normal. A middle ear effusion is present.  Nose: Mucosal edema and rhinorrhea present. Right sinus exhibits no maxillary sinus tenderness and no  frontal sinus tenderness. Left sinus exhibits no maxillary sinus tenderness and no frontal sinus tenderness.  Mouth/Throat: Uvula is midline and mucous membranes are normal. Posterior oropharyngeal erythema present.  Neck: Neck supple.  Cardiovascular: Normal rate, regular rhythm and normal heart sounds.  Exam reveals no gallop and no friction rub.   No murmur heard. Pulmonary/Chest: Effort normal and breath sounds normal. No accessory muscle usage. No  respiratory distress. She has no decreased breath sounds. She has no wheezes. She has no rhonchi. She has no rales. Chest wall is not dull to percussion. She exhibits no tenderness.  Bronchial breath sounds.   Abdominal: Soft. Normal appearance and bowel sounds are normal. She exhibits no distension and no mass. There is no tenderness. There is no rebound and no guarding.  Musculoskeletal: Normal range of motion. She exhibits no edema or tenderness.  Lymphadenopathy:    She has no cervical adenopathy.  Neurological: She is alert and oriented to person, place, and time.  Skin: Skin is warm and dry.   Results for orders placed or performed in visit on 01/13/16  HM DEXA SCAN  Result Value Ref Range   HM Dexa Scan osteoporosis       Assessment & Plan:   Problem List Items Addressed This Visit      Musculoskeletal and Integument   Osteoporosis, post-menopausal    Start Prolia since patient cannot tolerate fosamax once daily. Reviewed risks benefits and how to inject. Recheck 6 mos.       Relevant Medications   denosumab (PROLIA) 60 MG/ML SOLN injection    Other Visit Diagnoses    Bronchitis    -  Primary    Treat for bronchitis. No Abx indication. Prednisone and albuterol. Supportive care. Alarm symptoms reviewed. Return if not improving.     Relevant Medications    albuterol (PROVENTIL HFA;VENTOLIN HFA) 108 (90 Base) MCG/ACT inhaler    benzonatate (TESSALON) 100 MG capsule    predniSONE (DELTASONE) 20 MG tablet    dextromethorphan-guaiFENesin (MUCINEX DM) 30-600 MG 12hr tablet       Meds ordered this encounter  Medications  . albuterol (PROVENTIL HFA;VENTOLIN HFA) 108 (90 Base) MCG/ACT inhaler    Sig: Inhale 2 puffs into the lungs every 6 (six) hours as needed for wheezing or shortness of breath.    Dispense:  1 Inhaler    Refill:  0    Order Specific Question:  Supervising Provider    Answer:  Arlis Porta [id number]  . benzonatate (TESSALON) 100 MG capsule    Sig:  Take 1 capsule (100 mg total) by mouth 3 (three) times daily as needed.    Dispense:  30 capsule    Refill:  0    Order Specific Question:  Supervising Provider    Answer:  Arlis Porta [id number]  . predniSONE (DELTASONE) 20 MG tablet    Sig: Take 2 tablets (40 mg total) by mouth daily with breakfast.    Dispense:  10 tablet    Refill:  0    Order Specific Question:  Supervising Provider    Answer:  Arlis Porta [phone]  . dextromethorphan-guaiFENesin (MUCINEX DM) 30-600 MG 12hr tablet    Sig: Take 1 tablet by mouth 2 (two) times daily.    Dispense:  20 tablet    Refill:  0    Order Specific Question:  Supervising Provider    Answer:  Arlis Porta [phone]  . denosumab (PROLIA) 60 MG/ML SOLN  injection    Sig: Inject 60 mg into the skin every 6 (six) months. Administer in upper arm, thigh, or abdomen    Dispense:  1 Syringe    Refill:  2    Order Specific Question:  Supervising Provider    Answer:  Arlis Porta [id number]      Follow up plan: Return in about 6 months (around 08/01/2016), or if symptoms worsen or fail to improve.

## 2016-01-31 NOTE — Assessment & Plan Note (Addendum)
Start Prolia since patient cannot tolerate fosamax once daily. Reviewed risks benefits and how to inject. Recheck 6 mos.

## 2016-02-02 ENCOUNTER — Ambulatory Visit: Payer: Medicare Other | Admitting: Cardiology

## 2016-02-05 ENCOUNTER — Emergency Department: Payer: Medicare Other

## 2016-02-05 ENCOUNTER — Inpatient Hospital Stay
Admission: EM | Admit: 2016-02-05 | Discharge: 2016-02-15 | DRG: 272 | Disposition: A | Discharge status: Home / Self Care | Payer: Medicare Other | Attending: Vascular Surgery | Admitting: Vascular Surgery

## 2016-02-05 ENCOUNTER — Encounter: Payer: Self-pay | Admitting: Radiology

## 2016-02-05 DIAGNOSIS — I700 Atherosclerosis of aorta: Secondary | ICD-10-CM | POA: Insufficient documentation

## 2016-02-05 DIAGNOSIS — Z8711 Personal history of peptic ulcer disease: Secondary | ICD-10-CM | POA: Diagnosis not present

## 2016-02-05 DIAGNOSIS — K228 Other specified diseases of esophagus: Secondary | ICD-10-CM | POA: Diagnosis present

## 2016-02-05 DIAGNOSIS — I10 Essential (primary) hypertension: Secondary | ICD-10-CM | POA: Diagnosis present

## 2016-02-05 DIAGNOSIS — T82868A Thrombosis of vascular prosthetic devices, implants and grafts, initial encounter: Principal | ICD-10-CM | POA: Diagnosis present

## 2016-02-05 DIAGNOSIS — R072 Precordial pain: Secondary | ICD-10-CM | POA: Diagnosis not present

## 2016-02-05 DIAGNOSIS — F339 Major depressive disorder, recurrent, unspecified: Secondary | ICD-10-CM

## 2016-02-05 DIAGNOSIS — M62261 Nontraumatic ischemic infarction of muscle, right lower leg: Secondary | ICD-10-CM | POA: Diagnosis present

## 2016-02-05 DIAGNOSIS — Z87891 Personal history of nicotine dependence: Secondary | ICD-10-CM | POA: Diagnosis not present

## 2016-02-05 DIAGNOSIS — K219 Gastro-esophageal reflux disease without esophagitis: Secondary | ICD-10-CM | POA: Diagnosis present

## 2016-02-05 DIAGNOSIS — K449 Diaphragmatic hernia without obstruction or gangrene: Secondary | ICD-10-CM | POA: Diagnosis present

## 2016-02-05 DIAGNOSIS — E785 Hyperlipidemia, unspecified: Secondary | ICD-10-CM | POA: Insufficient documentation

## 2016-02-05 DIAGNOSIS — M810 Age-related osteoporosis without current pathological fracture: Secondary | ICD-10-CM | POA: Diagnosis present

## 2016-02-05 DIAGNOSIS — J45909 Unspecified asthma, uncomplicated: Secondary | ICD-10-CM | POA: Diagnosis present

## 2016-02-05 DIAGNOSIS — F329 Major depressive disorder, single episode, unspecified: Secondary | ICD-10-CM | POA: Diagnosis present

## 2016-02-05 DIAGNOSIS — K2970 Gastritis, unspecified, without bleeding: Secondary | ICD-10-CM | POA: Diagnosis present

## 2016-02-05 DIAGNOSIS — I739 Peripheral vascular disease, unspecified: Secondary | ICD-10-CM | POA: Diagnosis present

## 2016-02-05 DIAGNOSIS — R0789 Other chest pain: Secondary | ICD-10-CM | POA: Diagnosis present

## 2016-02-05 DIAGNOSIS — K2289 Other specified disease of esophagus: Secondary | ICD-10-CM | POA: Insufficient documentation

## 2016-02-05 DIAGNOSIS — Y832 Surgical operation with anastomosis, bypass or graft as the cause of abnormal reaction of the patient, or of later complication, without mention of misadventure at the time of the procedure: Secondary | ICD-10-CM | POA: Diagnosis present

## 2016-02-05 DIAGNOSIS — I749 Embolism and thrombosis of unspecified artery: Secondary | ICD-10-CM | POA: Diagnosis not present

## 2016-02-05 DIAGNOSIS — I6521 Occlusion and stenosis of right carotid artery: Secondary | ICD-10-CM

## 2016-02-05 DIAGNOSIS — Z720 Tobacco use: Secondary | ICD-10-CM | POA: Diagnosis not present

## 2016-02-05 DIAGNOSIS — J069 Acute upper respiratory infection, unspecified: Secondary | ICD-10-CM | POA: Diagnosis present

## 2016-02-05 DIAGNOSIS — I70291 Other atherosclerosis of native arteries of extremities, right leg: Secondary | ICD-10-CM | POA: Diagnosis present

## 2016-02-05 DIAGNOSIS — I209 Angina pectoris, unspecified: Secondary | ICD-10-CM | POA: Diagnosis not present

## 2016-02-05 DIAGNOSIS — I340 Nonrheumatic mitral (valve) insufficiency: Secondary | ICD-10-CM | POA: Diagnosis not present

## 2016-02-05 DIAGNOSIS — K2990 Gastroduodenitis, unspecified, without bleeding: Secondary | ICD-10-CM

## 2016-02-05 DIAGNOSIS — I998 Other disorder of circulatory system: Secondary | ICD-10-CM | POA: Insufficient documentation

## 2016-02-05 DIAGNOSIS — J40 Bronchitis, not specified as acute or chronic: Secondary | ICD-10-CM

## 2016-02-05 DIAGNOSIS — I7090 Unspecified atherosclerosis: Secondary | ICD-10-CM | POA: Insufficient documentation

## 2016-02-05 DIAGNOSIS — R079 Chest pain, unspecified: Secondary | ICD-10-CM | POA: Insufficient documentation

## 2016-02-05 LAB — HEPATIC FUNCTION PANEL
ALBUMIN: 4.2 g/dL (ref 3.5–5.0)
ALK PHOS: 52 U/L (ref 38–126)
ALT: 32 U/L (ref 14–54)
AST: 28 U/L (ref 15–41)
BILIRUBIN TOTAL: 0.1 mg/dL — AB (ref 0.3–1.2)
Bilirubin, Direct: <0.1 mg/dL — ABNORMAL LOW (ref 0.1–0.5)
TOTAL PROTEIN: 7.2 g/dL (ref 6.5–8.1)

## 2016-02-05 LAB — CBC
HEMATOCRIT: 43.5 % (ref 35.0–47.0)
Hemoglobin: 14.7 g/dL (ref 12.0–16.0)
MCH: 31.6 pg (ref 26.0–34.0)
MCHC: 33.7 g/dL (ref 32.0–36.0)
MCV: 93.8 fL (ref 80.0–100.0)
Platelets: 200 10*3/uL (ref 150–440)
RBC: 4.64 MIL/uL (ref 3.80–5.20)
RDW: 13.2 % (ref 11.5–14.5)
WBC: 12.2 10*3/uL — AB (ref 3.6–11.0)

## 2016-02-05 LAB — APTT

## 2016-02-05 LAB — BASIC METABOLIC PANEL
ANION GAP: 10 (ref 5–15)
BUN: 12 mg/dL (ref 6–20)
CHLORIDE: 103 mmol/L (ref 101–111)
CO2: 25 mmol/L (ref 22–32)
Calcium: 8.9 mg/dL (ref 8.9–10.3)
Creatinine, Ser: 1.16 mg/dL — ABNORMAL HIGH (ref 0.44–1.00)
GFR calc Af Amer: 57 mL/min — ABNORMAL LOW (ref >60)
GFR, EST NON AFRICAN AMERICAN: 49 mL/min — AB (ref >60)
GLUCOSE: 106 mg/dL — AB (ref 65–99)
POTASSIUM: 3.6 mmol/L (ref 3.5–5.1)
Sodium: 138 mmol/L (ref 135–145)

## 2016-02-05 LAB — PROTIME-INR
INR: 0.93
Prothrombin Time: 12.7 seconds (ref 11.4–15.0)

## 2016-02-05 MED ORDER — VENLAFAXINE HCL ER 75 MG PO CP24
75.0000 mg | ORAL_CAPSULE | Freq: Every day | ORAL | Status: DC
  Administered 2016-02-06 – 2016-02-09 (×4): 75 mg via ORAL
  Filled 2016-02-05 (×5): qty 1

## 2016-02-05 MED ORDER — FLEET ENEMA 7-19 GM/118ML RE ENEM: 1.0000 | ENEMA | Freq: Once | RECTAL | Status: DC | PRN

## 2016-02-05 MED ORDER — ONDANSETRON HCL 4 MG PO TABS
4.0000 mg | ORAL_TABLET | Freq: Four times a day (QID) | ORAL | Status: DC | PRN
Start: 2016-02-05 — End: 2016-02-09

## 2016-02-05 MED ORDER — DM-GUAIFENESIN ER 30-600 MG PO TB12
1.0000 | ORAL_TABLET | Freq: Two times a day (BID) | ORAL | Status: DC
  Filled 2016-02-05: qty 1

## 2016-02-05 MED ORDER — SORBITOL 70 % SOLN
30.0000 mL | Freq: Every day | Status: DC | PRN
  Filled 2016-02-05: qty 30

## 2016-02-05 MED ORDER — HEPARIN (PORCINE) IN NACL 100-0.45 UNIT/ML-% IJ SOLN
1100.0000 [IU]/h | INTRAMUSCULAR | Status: DC
Start: 2016-02-05 — End: 2016-02-06
  Administered 2016-02-05: 1100 [IU]/h via INTRAVENOUS
  Filled 2016-02-05: qty 250

## 2016-02-05 MED ORDER — CALCIUM CARBONATE-VITAMIN D 500-200 MG-UNIT PO TABS
3.0000 | ORAL_TABLET | Freq: Every day | ORAL | Status: DC
  Administered 2016-02-05 – 2016-02-15 (×9): 3 via ORAL
  Filled 2016-02-05 (×9): qty 3

## 2016-02-05 MED ORDER — ALBUTEROL SULFATE (2.5 MG/3ML) 0.083% IN NEBU: 2.5000 mg | INHALATION_SOLUTION | Freq: Four times a day (QID) | RESPIRATORY_TRACT | Status: DC | PRN

## 2016-02-05 MED ORDER — HEPARIN BOLUS VIA INFUSION
3200.0000 [IU] | Freq: Once | INTRAVENOUS | Status: AC
  Administered 2016-02-05: 3200 [IU] via INTRAVENOUS
  Filled 2016-02-05: qty 3200

## 2016-02-05 MED ORDER — BENZONATATE 100 MG PO CAPS: 100.0000 mg | ORAL_CAPSULE | Freq: Three times a day (TID) | ORAL | Status: DC | PRN

## 2016-02-05 MED ORDER — NITROGLYCERIN 0.4 MG SL SUBL: 0.4000 mg | SUBLINGUAL_TABLET | SUBLINGUAL | Status: DC | PRN

## 2016-02-05 MED ORDER — IOPAMIDOL (ISOVUE-370) INJECTION 76%
125.0000 mL | Freq: Once | INTRAVENOUS | Status: AC | PRN
  Administered 2016-02-05: 125 mL via INTRAVENOUS

## 2016-02-05 MED ORDER — MAGNESIUM HYDROXIDE 400 MG/5ML PO SUSP: 30.0000 mL | Freq: Every day | ORAL | Status: DC | PRN

## 2016-02-05 MED ORDER — PREDNISONE 20 MG PO TABS
40.0000 mg | ORAL_TABLET | Freq: Every day | ORAL | Status: DC
  Administered 2016-02-06 – 2016-02-15 (×5): 40 mg via ORAL
  Filled 2016-02-05 (×6): qty 2

## 2016-02-05 MED ORDER — DOCUSATE SODIUM 100 MG PO CAPS
100.0000 mg | ORAL_CAPSULE | Freq: Two times a day (BID) | ORAL | Status: DC
  Administered 2016-02-05 – 2016-02-15 (×8): 100 mg via ORAL
  Filled 2016-02-05 (×17): qty 1

## 2016-02-05 MED ORDER — ATORVASTATIN CALCIUM 10 MG PO TABS
10.0000 mg | ORAL_TABLET | Freq: Every day | ORAL | Status: DC
  Administered 2016-02-05 – 2016-02-15 (×10): 10 mg via ORAL
  Filled 2016-02-05 (×10): qty 1

## 2016-02-05 MED ORDER — GUAIFENESIN ER 600 MG PO TB12
600.0000 mg | ORAL_TABLET | Freq: Two times a day (BID) | ORAL | Status: DC
  Administered 2016-02-05 – 2016-02-15 (×16): 600 mg via ORAL
  Filled 2016-02-05 (×20): qty 1

## 2016-02-05 MED ORDER — BUPROPION HCL ER (XL) 150 MG PO TB24
150.0000 mg | ORAL_TABLET | Freq: Every day | ORAL | Status: DC
  Administered 2016-02-05 – 2016-02-15 (×11): 150 mg via ORAL
  Filled 2016-02-05 (×11): qty 1

## 2016-02-05 MED ORDER — DEXTROMETHORPHAN POLISTIREX ER 30 MG/5ML PO SUER
30.0000 mg | Freq: Two times a day (BID) | ORAL | Status: DC
  Administered 2016-02-05 – 2016-02-15 (×16): 30 mg via ORAL
  Filled 2016-02-05 (×22): qty 5

## 2016-02-05 MED ORDER — PANTOPRAZOLE SODIUM 40 MG PO TBEC
40.0000 mg | DELAYED_RELEASE_TABLET | Freq: Two times a day (BID) | ORAL | Status: DC
  Administered 2016-02-05 – 2016-02-15 (×19): 40 mg via ORAL
  Filled 2016-02-05 (×21): qty 1

## 2016-02-05 MED ORDER — ALBUTEROL SULFATE HFA 108 (90 BASE) MCG/ACT IN AERS: 2.0000 | INHALATION_SPRAY | Freq: Four times a day (QID) | RESPIRATORY_TRACT | Status: DC | PRN

## 2016-02-05 MED ORDER — SODIUM CHLORIDE 0.9% FLUSH
3.0000 mL | Freq: Two times a day (BID) | INTRAVENOUS | Status: DC
  Administered 2016-02-06 – 2016-02-08 (×6): 3 mL via INTRAVENOUS
  Administered 2016-02-09: 22:00:00 via INTRAVENOUS
  Administered 2016-02-10: 3 mL via INTRAVENOUS

## 2016-02-05 MED ORDER — ONDANSETRON HCL 4 MG/2ML IJ SOLN: 4.0000 mg | Freq: Four times a day (QID) | INTRAMUSCULAR | Status: DC | PRN

## 2016-02-05 MED ORDER — MORPHINE SULFATE (PF) 2 MG/ML IV SOLN
2.0000 mg | INTRAVENOUS | Status: DC | PRN
  Administered 2016-02-06 – 2016-02-09 (×4): 2 mg via INTRAVENOUS
  Filled 2016-02-05 (×4): qty 1

## 2016-02-05 MED ORDER — ZOLPIDEM TARTRATE 5 MG PO TABS
5.0000 mg | ORAL_TABLET | Freq: Every evening | ORAL | Status: DC | PRN
  Administered 2016-02-06 – 2016-02-14 (×8): 5 mg via ORAL
  Filled 2016-02-05 (×8): qty 1

## 2016-02-05 NOTE — ED Notes (Signed)
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

## 2016-02-05 NOTE — Progress Notes (Signed)
CRITICAL VALUE ALERT  Critical value received:  Over 160  Date of notification:  02/05/16  Time of notification:  [id number]  Critical value read back:yes  Nurse who received alert:  Brooks Sailors RN  MD notified (1st page):  Schnier  Time of first page:  [phone]  MD notified (2nd page):none  Time of second page:none  Responding MD:  Schnier  Time MD responded:  [id number]

## 2016-02-05 NOTE — ED Provider Notes (Signed)
Time Seen: Approximately 1240 I have reviewed the triage notes  Chief Complaint: Leg Pain   History of Present Illness: Sara [name] is a 65 y.o. female who states that she has a long history of atherosclerotic disease in his previous femoral-popliteal bypass in 1991 and repeat in 2012. The patient states she is not currently on any anticoagulation therapy. She states she was working out or garden yesterday and was bending over to do some bleeding and noticed some significant pain that occurred and swelling in her right leg. She states that she was able to return of circulation by walking and "" working it out but I was "" dragging my leg "". Patient states she still has continued pain in the right lower extremity at this time though only with ambulation. She denies any swelling in the lower extremities she denies any chest pain shortness of breath or any other concerns. States last time she had a vascular study was done at [facility].   Past Medical History  Diagnosis Date  . GERD (gastroesophageal reflux disease)   . Osteoporosis   . Vascular disease     Sees Dr. Delana Meyer  . Vertigo     Last episode approx Aug 2015  . Depression   . History of blood clots   . Stomach ulcer     Patient Active Problem List   Diagnosis Date Noted  . Carotid artery stenosis 01/04/2016  . Vertigo, benign paroxysmal 12/28/2015  . Clinical depression 09/06/2015  . History of alcoholism ([place]) 09/06/2015  . Angiopathy, 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[place]) 09/06/2015  . Peripheral vascular disease ([place]) 09/06/2015  . Acute non-recurrent maxillary sinusitis 07/14/2015  . Vaginal pruritus 05/26/2015  . Chronic recurrent major depressive disorder ([place]) 03/09/2015  . 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 Peripheral blood vessel disorder ([place]) 03/09/2015  . Acid reflux 03/09/2015  . GERD (gastroesophageal reflux disease) 12/21/2014  . Carotid artery narrowing 01/28/2014  . Peripheral arterial occlusive disease ([place])  06/08/2011    Past Surgical History  Procedure Laterality Date  . Vascular surgery  [id number]    Fem-Pop Bypass  . Hemorroidectomy  2014  . Esophagogastroduodenoscopy N/A 12/21/2014    Procedure: ESOPHAGOGASTRODUODENOSCOPY (EGD);  Surgeon: Lucilla Lame, MD;  Location: [place];  Service: Gastroenterology;  Laterality: N/A;  . Tonsilectomy, adenoidectomy, bilateral myringotomy and tubes  1968    Past Surgical History  Procedure Laterality Date  . Vascular surgery  [id number]    Fem-Pop Bypass  . Hemorroidectomy  2014  . Esophagogastroduodenoscopy N/A 12/21/2014    Procedure: ESOPHAGOGASTRODUODENOSCOPY (EGD);  Surgeon: Lucilla Lame, MD;  Location: [place];  Service: Gastroenterology;  Laterality: N/A;  . Tonsilectomy, adenoidectomy, bilateral myringotomy and tubes  1968    Current Outpatient Rx  Name  Route  Sig  Dispense  Refill  . albuterol (PROVENTIL HFA;VENTOLIN HFA) 108 (90 Base) MCG/ACT inhaler   Inhalation   Inhale 2 puffs into the lungs every 6 (six) hours as needed for wheezing or shortness of breath.   1 Inhaler   0   . atorvastatin (LIPITOR) 10 MG tablet   Oral   Take 1 tablet (10 mg total) by mouth daily.   30 tablet   3   . benzonatate (TESSALON) 100 MG capsule   Oral   Take 1 capsule (100 mg total) by mouth 3 (three) times daily as needed.   30 capsule   0   . buPROPion (WELLBUTRIN XL) 150 MG 24 hr tablet   Oral   Take 1  tablet (150 mg total) by mouth daily.   90 tablet   3   . Calcium Carbonate-Vitamin D (CALCIUM-VITAMIN D) 500-200 MG-UNIT tablet   Oral   Take 3 tablets by mouth daily.         . clobetasol cream (TEMOVATE) 0.05 %   Topical   Apply 1 application topically 2 (two) times daily.   30 g   0   . denosumab (PROLIA) 60 MG/ML SOLN injection   Subcutaneous   Inject 60 mg into the skin every 6 (six) months. Administer in upper arm, thigh, or abdomen   1 Syringe   2   . dextromethorphan-guaiFENesin (MUCINEX DM)  30-600 MG 12hr tablet   Oral   Take 1 tablet by mouth 2 (two) times daily.   20 tablet   0   . Melatonin 1 MG TABS   Oral   Take 1 tablet (1 mg total) by mouth Nightly.         . Multiple Vitamin (MULTIVITAMIN) capsule   Oral   Take 1 capsule by mouth daily. PM         . nitroGLYCERIN (NITROSTAT) 0.4 MG SL tablet   Sublingual   Place 1 tablet (0.4 mg total) under the tongue every 5 (five) minutes as needed for chest pain.   25 tablet   6   . pantoprazole (PROTONIX) 40 MG tablet   Oral   Take 1 tablet (40 mg total) by mouth 2 (two) times daily.   180 tablet   3   . predniSONE (DELTASONE) 20 MG tablet   Oral   Take 2 tablets (40 mg total) by mouth daily with breakfast.   10 tablet   0   . triamcinolone cream (KENALOG) 0.1 %   Topical   Apply 1 application topically 2 (two) times daily.   30 g   2   . venlafaxine XR (EFFEXOR-XR) 75 MG 24 hr capsule   Oral   Take 1 capsule (75 mg total) by mouth daily with breakfast.   90 capsule   3     Allergies:  Amoxicillin and Vicodin  Family History: Family History  Problem Relation Age of Onset  . Cancer Father     colon cancer  . Heart disease Maternal Uncle     Social History: Social History  Substance Use Topics  . Smoking status: Former Smoker    Quit date: 08/22/1991  . Smokeless tobacco: Never Used  . Alcohol Use: No     Review of Systems:   10 point review of systems was performed and was otherwise negative:  Constitutional: No fever Eyes: No visual disturbances ENT: No sore throat, ear pain Cardiac: No chest pain Respiratory: No shortness of breath, wheezing, or stridor Abdomen: No abdominal pain, no vomiting, No diarrhea Endocrine: No weight loss, No night sweats Extremities: Patient has not noted any obvious cyanosis. Skin: No rashes, easy bruising Neurologic: No focal weakness, trouble with speech or swollowing Urologic: No dysuria, Hematuria, or urinary frequency   Physical  Exam:  ED Triage Vitals  Enc Vitals Group     BP 02/05/16 1159 154/86 mmHg     Pulse Rate 02/05/16 1159 93     Resp 02/05/16 1159 18     Temp 02/05/16 1159 98.4 F (36.9 C)     Temp Source 02/05/16 1159 Oral     SpO2 02/05/16 1159 99 %     Weight 02/05/16 1159 140 lb (63.504 kg)  Height 02/05/16 1159 5\' 4"  (1.626 m)     Head Cir --      Peak Flow --      Pain Score 02/05/16 1350 0     Pain Loc --      Pain Edu? --      Excl. in [place]? --     General: Awake , Alert , and Oriented times 3; GCS 15 No apparent distress and denies any significant pain Head: Normal cephalic , atraumatic Eyes: Pupils equal , round, reactive to light Nose/Throat: No nasal drainage, patent upper airway without erythema or exudate.  Neck: Supple, Full range of motion, No anterior adenopathy or palpable thyroid masses Lungs: Clear to ascultation without wheezes , rhonchi, or rales Heart: Regular rate, regular rhythm without murmurs , gallops , or rubs Abdomen: Soft, non tender without rebound, guarding , or rigidity; bowel sounds positive and symmetric in all 4 quadrants. No organomegaly .        Extremities: Patient has some mild coolness in her right foot compared to the left. She has some delayed capillary refill though still present. Full range of motion of her right lower extremity. No obvious palpable dorsalis pedis or posterior tibial pulses at this time. Neurologic: normal ambulation, Motor symmetric without deficits, sensory intact Skin: warm, dry, no rashes   Labs:   All laboratory work was reviewed including any pertinent negatives or positives listed below:  Labs Reviewed  BASIC METABOLIC PANEL - Abnormal; Notable for the following:    Glucose, Bld 106 (*)    Creatinine, Ser 1.16 (*)    GFR calc non Af Amer 49 (*)    GFR calc Af Amer 57 (*)    All other components within normal limits  CBC - Abnormal; Notable for the following:    WBC 12.2 (*)    All other components within normal  limits  HEPATIC FUNCTION PANEL - Abnormal; Notable for the following:    Total Bilirubin 0.1 (*)    Bilirubin, Direct <0.1 (*)    All other components within normal limits  PROTIME-INR    Radiology: *   CT Angio Ao+Bifem W/Cm &/Or Wo/Cm (Final result) Result time: 02/05/16 14:09:27   Final result by Rad Results In Interface (02/05/16 14:09:27)   Narrative:   CLINICAL DATA: Right lower extremity pain yesterday. Ischemic right foot. History of right lower extremity arterial bypass procedure x2.  EXAM: CT ANGIOGRAPHY OF ABDOMINAL AORTA WITH ILIOFEMORAL RUNOFF  TECHNIQUE: Multidetector CT imaging of the abdomen, pelvis and lower extremities was performed using the standard protocol during bolus administration of intravenous contrast. Multiplanar CT image reconstructions and MIPs were obtained to evaluate the vascular anatomy.  CONTRAST: 125 cc Isovue 370  COMPARISON: None.  FINDINGS: Aorta is non aneurysmal and patent with minimal smooth plaque in the infrarenal aorta.  Celiac is patent. Branch vessels are patent.  SMA origin is occluded. Beyond the origin, the trunk reconstitutes via markedly hypertrophied pancreaticoduodenal collateral arteries.  IMA origin is occluded. Branches reconstitute  Two right renal arteries and a single left renal artery are patent  Mild atherosclerotic changes of the right common iliac artery. Right internal and external iliac artery are patent.  Mild calcified plaque in the distal left common iliac artery. It is patent. Left internal and external iliac arteries are patent.  The right common femoral artery is patent. The right superficial femoral artery is diminutive then becomes occluded in the adductor canal. A femoral popliteal bypass graft is occluded. The main  profunda femoral artery is patent. The major branch of the profunda femoral artery is occluded at table position 465. Smaller profunda femoral artery and muscular  branches remain patent. The popliteal artery is occluded. The posterior tibial and anterior tibial artery reconstitutes in are patent to the distal leg. The peroneal artery intermittently opacifies. Varicosities in the calf are noted.  Left common femoral and profunda femoral artery are patent. Left superficial femoral artery is patent. Left popliteal artery is patent. There is 3 vessel runoff to the distal leg.  Tiny hypodensity towards the dome of the liver at table position -15 is nonspecific.  Gallbladder, spleen, pancreas, adrenal glands, and kidneys are within normal limits.  No evidence of bowel obstruction. Normal appendix. No focal mass of the colon. Prominent stool burden in the colon.  Bladder, uterus, and adnexa are within normal limits  No free-fluid. No obvious retroperitoneal adenopathy.  There is an L1 compression fracture with 30% loss of height anteriorly. It has a chronic appearance.  Review of the MIP images confirms the above findings.  IMPRESSION: No significant obstruction of the aorta or iliac arterial system  The right superficial femoral artery and popliteal artery are occluded. The right femoral to distal bypass graft is occluded. The major branch of the right profunda femoral artery is also occluded. Small profunda femoral an muscular branches eventually reconstitute the anterior and posterior tibial arteries.  Left lower extremity arterial system is patent with 3 vessel runoff to the distal leg  The origins of the IMA and SMA are occluded. These vessels reconstitute via collaterals from the celiac artery which is patent.  Tiny hypodensity in the liver is nonspecific. If there is a history of malignancy or a there is high risk for malignancy, six-month followup MRI is recommended.  L1 compression fracture as described.  There are varicosities in the right calf. Venous insufficiency is not excluded.   Electronically Signed By: Marybelle Killings M.D. On: 02/05/2016 14:09       I personally reviewed the radiologic studies     ED Course:  Patient appears to have based on her CAT scan some occluded areas of her femoral-popliteal bypass grafting site. She appears clinically to be getting some flow to the right foot and ankle region. The patient is otherwise hemodynamically stable and I reviewed her case with the vascular surgeon on call is agreed to see and evaluate the patient and formulate treatment plan. She is currently resting comfortably and has no pain in the right lower extremity and sensory still appears to be intact   Assessment: Peripheral vascular disease      Plan:   vascular surgery assessment           Daymon Larsen, MD 02/05/16 [phone]

## 2016-02-05 NOTE — ED Notes (Signed)
Returned from CT.

## 2016-02-05 NOTE — H&P (Signed)
[facility] Admission History & Physical  MRN : [id number]  Sara [name] is a 65 y.o. ([birth year]) female who presents with chief complaint of  Chief Complaint  Patient presents with  . Leg Pain  .  History of Present Illness: The patient is a 65 year old woman with an extensive past medical history of vascular disease who presents to the emergency room today with roughly 36 hours of increasing right lower extremity pain. The patient notes that she was doing quite well and was actually out in the yard working in the garden when she abruptly felt pain in her right leg that she describes as a severe cramp. After that the pain seemed to subside somewhat but she began having increasing difficulty with ambulation. At this time she states she is only able to walk a dozen steps before the pain stops her and her foot becomes numb. When she is resting as she is in the emergency room she denies pain and is quite comfortable.  She has a history of vascular bypass with her right great saphenous vein in 1991 at the age of 52. She is uncertain as to the circumstances that required bypass but she did say they attempted thrombolysis initially which appeared to been successful but she rethrombosed within a week and subsequently underwent bypass. She did well after that until 2011 when she again had thrombosis of her bypass and this time redo bypass surgery was performed in [place] using the vein from the left leg. She notes that approximately 4-6 months ago she did have an ultrasound which apparently was reported as normal.  As ancillary and complicating factors the patient has a fairly extensive history of bleeding peptic ulcer disease as well as ulcerative reflux esophagitis. She has been cared for by Dr. Lucilla Lame. No recent history of GI bleeding. As is the reason however that she is not on any aspirin therapy.  There is no history of atrial fibrillation palpitations or other  tachycardic arrhythmia. However, she recently did see Dr. Erma Heritage and is actually scheduled for a stress test this coming week.  Current Facility-Administered Medications  Medication Dose Route Frequency Provider Last Rate Last Dose  . albuterol (PROVENTIL HFA;VENTOLIN HFA) 108 (90 Base) MCG/ACT inhaler 2 puff  2 puff Inhalation Q6H PRN [name], MD      . atorvastatin (LIPITOR) tablet 10 mg  10 mg Oral Daily [name], MD      . benzonatate (TESSALON) capsule 100 mg  100 mg Oral TID PRN [name], MD      . buPROPion (WELLBUTRIN XL) 24 hr tablet 150 mg  150 mg Oral Daily [name], MD      . calcium-vitamin D 500-200 MG-UNIT per tablet 3 tablet  3 tablet Oral Daily [name], MD      . dextromethorphan-guaiFENesin [facility] DM) 30-600 MG per 12 hr tablet 1 tablet  1 tablet Oral BID [name], MD      . docusate sodium (COLACE) capsule 100 mg  100 mg Oral BID [name], MD      . magnesium hydroxide (MILK OF MAGNESIA) suspension 30 mL  30 mL Oral Daily PRN [name], MD      . morphine 2 MG/ML injection 2 mg  2 mg Intravenous Q1H PRN [name], MD      . nitroGLYCERIN (NITROSTAT) SL tablet 0.4 mg  0.4 mg Sublingual Q5 min PRN [name], MD      .  ondansetron (ZOFRAN) tablet 4 mg  4 mg Oral Q6H PRN [name], MD       Or  . ondansetron [facility]) injection 4 mg  4 mg Intravenous Q6H PRN [name], MD      . pantoprazole (PROTONIX) EC tablet 40 mg  40 mg Oral BID [name], MD      . Derrill Memo ON 02/06/2016] predniSONE (DELTASONE) tablet 40 mg  40 mg Oral Q breakfast [name], MD      . sodium chloride flush (NS) 0.9 % injection 3 mL  3 mL Intravenous Q12H [name], MD      . sodium phosphate (FLEET) 7-19 GM/118ML enema 1 enema  1 enema Rectal Once PRN [name], MD      . sorbitol 70 % solution 30 mL  30 mL Oral Daily PRN [name], MD      . Derrill Memo ON 02/06/2016]  venlafaxine XR (EFFEXOR-XR) 24 hr capsule 75 mg  75 mg Oral Q breakfast [name], MD      . zolpidem (AMBIEN) tablet 5 mg  5 mg Oral QHS PRN [name], MD       Current Outpatient Prescriptions  Medication Sig Dispense Refill  . albuterol (PROVENTIL HFA;VENTOLIN HFA) 108 (90 Base) MCG/ACT inhaler Inhale 2 puffs into the lungs every 6 (six) hours as needed for wheezing or shortness of breath. 1 Inhaler 0  . atorvastatin (LIPITOR) 10 MG tablet Take 1 tablet (10 mg total) by mouth daily. 30 tablet 3  . benzonatate (TESSALON) 100 MG capsule Take 1 capsule (100 mg total) by mouth 3 (three) times daily as needed. 30 capsule 0  . buPROPion (WELLBUTRIN XL) 150 MG 24 hr tablet Take 1 tablet (150 mg total) by mouth daily. 90 tablet 3  . Calcium Carbonate-Vitamin D (CALCIUM-VITAMIN D) 500-200 MG-UNIT tablet Take 3 tablets by mouth daily.    . clobetasol cream (TEMOVATE) [id number] % Apply 1 application topically 2 (two) times daily. 30 g 0  . denosumab (PROLIA) 60 MG/ML SOLN injection Inject 60 mg into the skin every 6 (six) months. Administer in upper arm, thigh, or abdomen 1 Syringe 2  . dextromethorphan-guaiFENesin (MUCINEX DM) 30-600 MG 12hr tablet Take 1 tablet by mouth 2 (two) times daily. 20 tablet 0  . Melatonin 1 MG TABS Take 1 tablet (1 mg total) by mouth Nightly.    . Multiple Vitamin (MULTIVITAMIN) capsule Take 1 capsule by mouth daily. PM    . nitroGLYCERIN (NITROSTAT) 0.4 MG SL tablet Place 1 tablet (0.4 mg total) under the tongue every 5 (five) minutes as needed for chest pain. 25 tablet 6  . pantoprazole (PROTONIX) 40 MG tablet Take 1 tablet (40 mg total) by mouth 2 (two) times daily. 180 tablet 3  . predniSONE (DELTASONE) 20 MG tablet Take 2 tablets (40 mg total) by mouth daily with breakfast. 10 tablet 0  . triamcinolone cream (KENALOG) 0.1 % Apply 1 application topically 2 (two) times daily. 30 g 2  . venlafaxine XR (EFFEXOR-XR) 75 MG 24 hr capsule Take 1 capsule (75 mg total)  by mouth daily with breakfast. 90 capsule 3    Past Medical History  Diagnosis Date  . GERD (gastroesophageal reflux disease)   . Osteoporosis   . Vascular disease     Sees Dr. Delana Meyer  . Vertigo     Last episode approx Aug 2015  . Depression   . History of blood clots   .  Stomach ulcer     Past Surgical History  Procedure Laterality Date  . Vascular surgery  [id number]    Fem-Pop Bypass  . Hemorroidectomy  2014  . Esophagogastroduodenoscopy N/A 12/21/2014    Procedure: ESOPHAGOGASTRODUODENOSCOPY (EGD);  Surgeon: Lucilla Lame, MD;  Location: [facility];  Service: Gastroenterology;  Laterality: N/A;  . Tonsilectomy, adenoidectomy, bilateral myringotomy and tubes  1968    Social History Social History  Substance Use Topics  . Smoking status: Former Smoker -- 2.00 packs/day for 25 years    Types: Cigarettes    Quit date: 08/22/1991  . Smokeless tobacco: Never Used  . Alcohol Use: No    Family History Family History  Problem Relation Age of Onset  . Cancer Father     colon cancer  . Heart disease Maternal Uncle   . CVA Mother   . Heart attack Mother   . Colon cancer Father   No family history of bleeding clotting disorders, diabetes, porphyria  Allergies  Allergen Reactions  . Amoxicillin Other (See Comments)    Yeast infection  . Vicodin [Hydrocodone-Acetaminophen] Hives and Rash     REVIEW OF SYSTEMS (Negative unless checked)  Constitutional: [] Weight loss  [] Fever  [] Chills Cardiac: [] Chest pain   [] Chest pressure   [] Palpitations   [] Shortness of breath when laying flat   [] Shortness of breath at rest   [] Shortness of breath with exertion. Vascular:  [x] Pain in legs with walking   [] Pain in legs at rest   [] Pain in legs when laying flat   [x] Claudication   [x] Pain in feet when walking  [] Pain in feet at rest  [] Pain in feet when laying flat   [] History of DVT   [] Phlebitis   [] Swelling in legs   [] Varicose veins   [] Non-healing ulcers Pulmonary:    [] Uses home oxygen   [] Productive cough   [] Hemoptysis   [] Wheeze  [] COPD   [] Asthma Neurologic:  [] Dizziness  [] Blackouts   [] Seizures   [] History of stroke   [] History of TIA  [] Aphasia   [] Temporary blindness   [] Dysphagia   [] Weakness or numbness in arms   [] Weakness or numbness in legs Musculoskeletal:  [] Arthritis   [] Joint swelling   [] Joint pain   [] Low back pain Hematologic:  [] Easy bruising  [] Easy bleeding   [] Hypercoagulable state   [] Anemic  [] Hepatitis Gastrointestinal:  [] Blood in stool   [] Vomiting blood  [x] Gastroesophageal reflux/heartburn   [] Difficulty swallowing. Genitourinary:  [] Chronic kidney disease   [] Difficult urination  [] Frequent urination  [] Burning with urination   [] Blood in urine Skin:  [] Rashes   [] Ulcers   [] Wounds Psychological:  [] History of anxiety   []  History of major depression.  Physical Examination  Filed Vitals:   02/05/16 1400 02/05/16 1430 02/05/16 1500 02/05/16 1630  BP: 127/78 126/80 131/91 153/90  Pulse: 83 86 82 93  Temp:      TempSrc:      Resp: 26 28 25 28   Height:      Weight:      SpO2: 96% 92% 93% 99%   Body mass index is 24.02 kg/(m^2). Gen: WD/WN, NAD Head: [place]/AT, No temporalis wasting.  Ear/Nose/Throat: Hearing grossly intact, nares w/o erythema or drainage, oropharynx w/o Erythema/Exudate, Eyes: PERRLA, EOMI.  Neck: Supple, no nuchal rigidity.  No bruit or JVD.  Pulmonary:  Good air movement, clear to auscultation bilaterally, no increased work of respiration or use of accessory muscles  Cardiac: RRR, normal S1, S2, no Murmurs, rubs or gallops. Vascular: Right  foot is cool but pink there is 1-1/2 second capillary refill. No open wounds sores or ulcers. Vessel Right Left  Radial Palpable Palpable  Ulnar Palpable Palpable  Brachial Palpable Palpable  Carotid Palpable, without bruit Palpable, without bruit  Aorta Not palpable N/A  Femoral Palpable Palpable  Popliteal Not Palpable Palpable  PT Not Palpable Not Palpable   DP Not Palpable Not Palpable   Gastrointestinal: soft, non-tender/non-distended. No guarding/reflex. No masses, surgical incisions, or scars. Musculoskeletal: M/S 5/5 throughout.  No deformity or atrophy. Neurologic: CN 2-12 intact. Pain and light touch intact in extremities.  Symmetrical.  Speech is fluent. Motor exam as listed above. Psychiatric: Judgment intact, Mood & affect appropriate for pt's clinical situation. Dermatologic: No rashes or ulcers noted.  No cellulitis or open wounds. Lymph : No Cervical, Axillary, or Inguinal lymphadenopathy.    CBC Lab Results  Component Value Date   WBC 12.2* 02/05/2016   HGB 14.7 02/05/2016   HCT 43.5 02/05/2016   MCV 93.8 02/05/2016   PLT 200 02/05/2016    BMET    Component Value Date/Time   NA 138 02/05/2016 1221   NA 139 09/21/2015 0916   K 3.6 02/05/2016 1221   CL 103 02/05/2016 1221   CO2 25 02/05/2016 1221   GLUCOSE 106* 02/05/2016 1221   GLUCOSE 85 09/21/2015 0916   BUN 12 02/05/2016 1221   BUN 12 09/21/2015 0916   CREATININE 1.16* 02/05/2016 1221   CALCIUM 8.9 02/05/2016 1221   GFRNONAA 49* 02/05/2016 1221   GFRAA 57* 02/05/2016 1221   Estimated Creatinine Clearance: 42.9 mL/min (by C-G formula based on Cr of 1.16).  COAG Lab Results  Component Value Date   INR 0.93 02/05/2016    Radiology Ct Angio Ao+bifem W/cm &/or Wo/cm  02/05/2016  CLINICAL DATA:  Right lower extremity pain yesterday. Ischemic right foot. History of right lower extremity arterial bypass procedure x2. EXAM: CT ANGIOGRAPHY OF ABDOMINAL AORTA WITH ILIOFEMORAL RUNOFF TECHNIQUE: Multidetector CT imaging of the abdomen, pelvis and lower extremities was performed using the standard protocol during bolus administration of intravenous contrast. Multiplanar CT image reconstructions and MIPs were obtained to evaluate the vascular anatomy. CONTRAST:  125 cc Isovue 370 COMPARISON:  None. FINDINGS: Aorta is non aneurysmal and patent with minimal smooth plaque  in the infrarenal aorta. Celiac is patent.  Branch vessels are patent. SMA origin is occluded. Beyond the origin, the trunk reconstitutes via markedly hypertrophied pancreaticoduodenal collateral arteries. IMA origin is occluded.  Branches reconstitute Two right renal arteries and a single left renal artery are patent Mild atherosclerotic changes of the right common iliac artery. Right internal and external iliac artery are patent. Mild calcified plaque in the distal left common iliac artery. It is patent. Left internal and external iliac arteries are patent. The right common femoral artery is patent. The right superficial femoral artery is diminutive then becomes occluded in the adductor canal. A femoral popliteal bypass graft is occluded. The main profunda femoral artery is patent. The major branch of the profunda femoral artery is occluded at table position 465. Smaller profunda femoral artery and muscular branches remain patent. The popliteal artery is occluded. The posterior tibial and anterior tibial artery reconstitutes in are patent to the distal leg. The peroneal artery intermittently opacifies. Varicosities in the calf are noted. Left common femoral and profunda femoral artery are patent. Left superficial femoral artery is patent. Left popliteal artery is patent. There is 3 vessel runoff to the distal leg. Tiny hypodensity towards the dome  of the liver at table position -15 is nonspecific. Gallbladder, spleen, pancreas, adrenal glands, and kidneys are within normal limits. No evidence of bowel obstruction. Normal appendix. No focal mass of the colon. Prominent stool burden in the colon. Bladder, uterus, and adnexa are within normal limits No free-fluid.  No obvious retroperitoneal adenopathy. There is an L1 compression fracture with 30% loss of height anteriorly. It has a chronic appearance. Review of the MIP images confirms the above findings. IMPRESSION: No significant obstruction of the aorta or iliac  arterial system The right superficial femoral artery and popliteal artery are occluded. The right femoral to distal bypass graft is occluded. The major branch of the right profunda femoral artery is also occluded. Small profunda femoral an muscular branches eventually reconstitute the anterior and posterior tibial arteries. Left lower extremity arterial system is patent with 3 vessel runoff to the distal leg The origins of the IMA and SMA are occluded. These vessels reconstitute via collaterals from the celiac artery which is patent. Tiny hypodensity in the liver is nonspecific. If there is a history of malignancy or a there is high risk for malignancy, six-month followup MRI is recommended. L1 compression fracture as described. There are varicosities in the right calf. Venous insufficiency is not excluded. Electronically Signed   By: Marybelle Killings M.D.   On: 02/05/2016 14:09    Assessment/Plan 1.  Ischemic right leg: Clearly, this is a very complicated situation there is no question that the compromise in her circulation has resulted in her right lower extremity being ischemic to the point that it requires intervention. Given the fact that she has had multiple surgeries in the past and that her greater saphenous vein has been harvested from both legs revascularization is going to represent significant challenges. 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 All  of this is very unusual, given the CT findings you would think embolic disease over atherosclerotic occlusive disease. At this time I  will heparinize the patient her foot is not in imminent danger and therefore I will proceed with cardiac workup I will also need to assess the status of her peptic ulcer disease and erosive reflux disease as these factors may have a tremendous influence on whether lytic therapy is appropriate as well as what long-term anticoagulation and antiplatelet therapy is going to be feasible. Certainly after the initial assessment by both cardiology and hopefully GI plans will be made to move forward with angiography and possible intervention. 2.  Chest pain:  I will ask cardiology to evaluate. Certainly echocardiography is going to be needed and telemetry. Stress test will also be helpful. 3.  Peptic ulcer disease and erosive reflux disease:  GI will be asked to evaluate and PPI will be continued 4.  Hypertension:  Her home medications will be continued for now it will be adjusted to keep her in a normotensive range particularly if thrombolytic therapy is required. 5.  Reactive airway disease:  Medicine has been asked to evaluate the patient and assist with treatment of her pulmonary condition as well as her other medical comorbidities.  6.  Hyperlipidemia: cont. Atorvastatin 7.  Depression:  cont. Effexor, Wellbutrin   Mylo Driskill, Dolores Lory, MD  02/05/2016 5:18 PM

## 2016-02-05 NOTE — ED Notes (Signed)
Patient transported to CT 

## 2016-02-05 NOTE — ED Notes (Addendum)
Patient states that yesterday she started experiencing pain and swelling in her right leg. Patient states that after about 5 minutes of walking on her leg the leg becomes painful, numb, changes it changes color  and she has to "drag her leg". 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 Patient does not take blood thinners.

## 2016-02-05 NOTE — Consult Note (Signed)
ANTICOAGULATION CONSULT NOTE - Initial Consult  Pharmacy Consult for heparin drip Indication: thrombosis  Allergies  Allergen Reactions  . Amoxicillin Other (See Comments)    Yeast infection  . Vicodin [Hydrocodone-Acetaminophen] Hives and Rash    Patient Measurements: Height: 5\' 4"  (162.6 cm) Weight: 140 lb (63.504 kg) IBW/kg (Calculated) : 54.7 Heparin Dosing Weight: 63.5kg  Vital Signs: Temp: 98.4 F (36.9 C) (06/17 1159) Temp Source: Oral (06/17 1159) BP: 129/76 mmHg (06/17 1800) Pulse Rate: 95 (06/17 1800)  Labs:  Recent Labs  02/05/16 1221  HGB 14.7  HCT 43.5  PLT 200  LABPROT 12.7  INR 0.93  CREATININE 1.16*    Estimated Creatinine Clearance: 42.9 mL/min (by C-G formula based on Cr of 1.16).   Medical History: Past Medical History  Diagnosis Date  . GERD (gastroesophageal reflux disease)   . Osteoporosis   . Vascular disease     Sees Dr. Delana Meyer  . Vertigo     Last episode approx Aug 2015  . Depression   . History of blood clots   . Stomach ulcer     Medications:  Scheduled:  . atorvastatin  10 mg Oral Daily  . buPROPion  150 mg Oral Daily  . calcium-vitamin D  3 tablet Oral Daily  . dextromethorphan-guaiFENesin  1 tablet Oral BID  . docusate sodium  100 mg Oral BID  . heparin  3,200 Units Intravenous Once  . pantoprazole  40 mg Oral BID  . [START ON 02/06/2016] predniSONE  40 mg Oral Q breakfast  . sodium chloride flush  3 mL Intravenous Q12H  . [START ON 02/06/2016] venlafaxine XR  75 mg Oral Q breakfast    Assessment: Pt is a 65 year old female with a PMH of vascular disease with a thrombosis of right leg bypass. Pharmacy consulted to dose heparin drip. Per Dr. Delana Meyer pt has no CI to full anticoagulation and ok to use nomogram  Goal of Therapy:  Heparin level 0.3-0.7 units/ml Monitor platelets by anticoagulation protocol: Yes   Plan:  Give 3200 units bolus x 1 Start heparin infusion at 1100 units/hr Check anti-Xa level in 6 hours  and daily while on heparin Continue to monitor H&H and platelets  Knox Holdman D Lorelle Macaluso 02/05/2016,6:07 PM

## 2016-02-05 NOTE — Consult Note (Signed)
[facility] [place] NAME: Sara [name]    MR#:  [id number]  DATE OF BIRTH:  1950/11/14  DATE OF CONSULT:  02/05/2016  PRIMARY CARE PHYSICIAN: Leata Mouse, NP   REQUESTING/REFERRING PHYSICIAN: Dr. [name]  CHIEF COMPLAINT:   Chief Complaint  Patient presents with  . Leg Pain    HISTORY OF PRESENT ILLNESS:  Sara [name]  is a 65 y.o. female with a known history of GERD, osteoporosis, peripheral vascular disease, history of peptic ulcer disease, depression, hypertension, hyperlipidemia who presents to the hospital due to right lower extremity pain. Patient says that she was in her usual state of health and pulling some weeds in her yard when shortly thereafter she developed significant pain in her right lower extremity and then was dragging her right foot. She noticed that her toes on her right foot have changed in color and therefore she came to the ER for further evaluation. In the ER patient underwent CT angiogram and was noted to have an acute occlusion of the superficial femoral artery and also the popliteal artery. Patient's right femoral distal bypass graft was also occluded. Patient was also incidentally noted to have a occluded superior and inferior mesenteric arteries although she denies any acute abdominal pain. Patient also has been complaining of some intermittent chest pain ongoing for the past few weeks. She was referred by her PCP to a cardiologist and is supposed to get a stress test and echocardiogram this coming week but now presents with the above complaints as mentioned. Hospitalist services were contacted for medical management.  PAST MEDICAL HISTORY:   Past Medical History  Diagnosis Date  . GERD (gastroesophageal reflux disease)   . Osteoporosis   . Vascular disease     Sees Dr. Delana Meyer  . Vertigo     Last episode approx Aug 2015  . Depression   . History of blood clots   . Stomach ulcer     PAST SURGICAL  HISTOIRY:   Past Surgical History  Procedure Laterality Date  . Vascular surgery  [id number]    Fem-Pop Bypass  . Hemorroidectomy  2014  . Esophagogastroduodenoscopy N/A 12/21/2014    Procedure: ESOPHAGOGASTRODUODENOSCOPY (EGD);  Surgeon: Lucilla Lame, MD;  Location: [facility];  Service: Gastroenterology;  Laterality: N/A;  . Tonsilectomy, adenoidectomy, bilateral myringotomy and tubes  1968    SOCIAL HISTORY:   Social History  Substance Use Topics  . Smoking status: Former Smoker -- 2.00 packs/day for 25 years    Types: Cigarettes    Quit date: 08/22/1991  . Smokeless tobacco: Never Used  . Alcohol Use: No    FAMILY HISTORY:   Family History  Problem Relation Age of Onset  . Cancer Father     colon cancer  . Heart disease Maternal Uncle   . CVA Mother   . Heart attack Mother   . Colon cancer Father     DRUG ALLERGIES:   Allergies  Allergen Reactions  . Amoxicillin Other (See Comments)    Yeast infection  . Vicodin [Hydrocodone-Acetaminophen] Hives and Rash    REVIEW OF SYSTEMS:   Review of Systems  Constitutional: Negative for fever, chills and weight loss.  HENT: Negative for congestion, nosebleeds and tinnitus.   Eyes: Negative for blurred vision, double vision and redness.  Respiratory: Negative for cough, hemoptysis, shortness of breath and wheezing.   Cardiovascular: Positive for claudication. Negative for chest pain, orthopnea, leg swelling and PND.  Gastrointestinal: Negative  for nausea, vomiting, abdominal pain, diarrhea and melena.  Genitourinary: Negative for dysuria, urgency and hematuria.  Musculoskeletal: Negative for joint pain and falls.  Neurological: Negative for dizziness, tingling, sensory change, focal weakness, seizures, weakness and headaches.  Endo/Heme/Allergies: Negative for polydipsia. Does not bruise/bleed easily.  Psychiatric/Behavioral: Negative for depression and memory loss. The patient is not nervous/anxious.   All other  systems reviewed and are negative.    MEDICATIONS AT HOME:   Prior to Admission medications   Medication Sig Start Date End Date Taking? Authorizing Provider  albuterol (PROVENTIL HFA;VENTOLIN HFA) 108 (90 Base) MCG/ACT inhaler Inhale 2 puffs into the lungs every 6 (six) hours as needed for wheezing or shortness of breath. 01/31/16  Yes Amy Overton Mam, NP  atorvastatin (LIPITOR) 10 MG tablet Take 1 tablet (10 mg total) by mouth daily. 01/04/16  Yes Amy Overton Mam, NP  benzonatate (TESSALON) 100 MG capsule Take 1 capsule (100 mg total) by mouth 3 (three) times daily as needed. 01/31/16  Yes Amy Overton Mam, NP  buPROPion (WELLBUTRIN XL) 150 MG 24 hr tablet Take 1 tablet (150 mg total) by mouth daily. 09/06/15  Yes Amy Overton Mam, NP  Calcium Carbonate-Vitamin D (CALCIUM-VITAMIN D) 500-200 MG-UNIT tablet Take 3 tablets by mouth daily.   Yes Historical Provider, MD  clobetasol cream (TEMOVATE) [id number] % Apply 1 application topically 2 (two) times daily. 12/06/15  Yes Amy Overton Mam, NP  denosumab (PROLIA) 60 MG/ML SOLN injection Inject 60 mg into the skin every 6 (six) months. Administer in upper arm, thigh, or abdomen 01/31/16  Yes Amy Overton Mam, NP  dextromethorphan-guaiFENesin (MUCINEX DM) 30-600 MG 12hr tablet Take 1 tablet by mouth 2 (two) times daily. 01/31/16  Yes Amy Overton Mam, NP  Melatonin 1 MG TABS Take 1 tablet (1 mg total) by mouth Nightly. 12/28/15  Yes Amy Overton Mam, NP  Multiple Vitamin (MULTIVITAMIN) capsule Take 1 capsule by mouth daily. PM   Yes Historical Provider, MD  nitroGLYCERIN (NITROSTAT) 0.4 MG SL tablet Place 1 tablet (0.4 mg total) under the tongue every 5 (five) minutes as needed for chest pain. 01/27/16  Yes Wende Bushy, MD  pantoprazole (PROTONIX) 40 MG tablet Take 1 tablet (40 mg total) by mouth 2 (two) times daily. 09/06/15  Yes Amy Overton Mam, NP  predniSONE (DELTASONE) 20 MG tablet Take 2 tablets (40 mg total) by mouth daily with breakfast. 01/31/16  Yes Amy  Overton Mam, NP  triamcinolone cream (KENALOG) 0.1 % Apply 1 application topically 2 (two) times daily. 10/11/15  Yes Amy Overton Mam, NP  venlafaxine XR (EFFEXOR-XR) 75 MG 24 hr capsule Take 1 capsule (75 mg total) by mouth daily with breakfast. 09/06/15  Yes Amy Overton Mam, NP      VITAL SIGNS:  Blood pressure 153/90, pulse 93, temperature 98.4 F (36.9 C), temperature source Oral, resp. rate 28, height 5\' 4"  (1.626 m), weight 63.504 kg (140 lb), SpO2 99 %.  PHYSICAL EXAMINATION:  GENERAL:  65 y.o.-year-old patient lying in the bed in no acute distress.  EYES: Pupils equal, round, reactive to light and accommodation. No scleral icterus. Extraocular muscles intact.  HEENT: Head atraumatic, normocephalic. Oropharynx and nasopharynx clear.  NECK:  Supple, no jugular venous distention. No thyroid enlargement, no tenderness.  LUNGS: Normal breath sounds bilaterally, no wheezing, rales, rhonchi . No use of accessory muscles of respiration.  CARDIOVASCULAR: S1, S2, RRR. No murmurs, rubs, gallops, clicks.  ABDOMEN: Soft, nontender, nondistended. Bowel sounds present. No organomegaly or mass.  EXTREMITIES: No pedal edema, clubbing, dusky-appearing right lower extremity with poor palpable pulses.  NEUROLOGIC: Cranial nerves II through XII are intact. No focal motor or sensory deficits appreciated bilaterally  PSYCHIATRIC: The patient is alert and oriented x 3. Good affect SKIN: No obvious rash, lesion, or ulcer.   LABORATORY PANEL:   CBC  Recent Labs Lab 02/05/16 1221  WBC 12.2*  HGB 14.7  HCT 43.5  PLT 200   ------------------------------------------------------------------------------------------------------------------  Chemistries   Recent Labs Lab 02/05/16 1221  NA 138  K 3.6  CL 103  CO2 25  GLUCOSE 106*  BUN 12  CREATININE 1.16*  CALCIUM 8.9  AST 28  ALT 32  ALKPHOS 52  BILITOT 0.1*    ------------------------------------------------------------------------------------------------------------------  Cardiac Enzymes No results for input(s): TROPONINI in the last 168 hours. ------------------------------------------------------------------------------------------------------------------  RADIOLOGY:  Ct Angio Ao+bifem W/cm &/or Wo/cm  02/05/2016  CLINICAL DATA:  Right lower extremity pain yesterday. Ischemic right foot. History of right lower extremity arterial bypass procedure x2. EXAM: CT ANGIOGRAPHY OF ABDOMINAL AORTA WITH ILIOFEMORAL RUNOFF TECHNIQUE: Multidetector CT imaging of the abdomen, pelvis and lower extremities was performed using the standard protocol during bolus administration of intravenous contrast. Multiplanar CT image reconstructions and MIPs were obtained to evaluate the vascular anatomy. CONTRAST:  125 cc Isovue 370 COMPARISON:  None. FINDINGS: Aorta is non aneurysmal and patent with minimal smooth plaque in the infrarenal aorta. Celiac is patent.  Branch vessels are patent. SMA origin is occluded. Beyond the origin, the trunk reconstitutes via markedly hypertrophied pancreaticoduodenal collateral arteries. IMA origin is occluded.  Branches reconstitute Two right renal arteries and a single left renal artery are patent Mild atherosclerotic changes of the right common iliac artery. Right internal and external iliac artery are patent. Mild calcified plaque in the distal left common iliac artery. It is patent. Left internal and external iliac arteries are patent. The right common femoral artery is patent. The right superficial femoral artery is diminutive then becomes occluded in the adductor canal. A femoral popliteal bypass graft is occluded. The main profunda femoral artery is patent. The major branch of the profunda femoral artery is occluded at table position 465. Smaller profunda femoral artery and muscular branches remain patent. The popliteal artery is occluded.  The posterior tibial and anterior tibial artery reconstitutes in are patent to the distal leg. The peroneal artery intermittently opacifies. Varicosities in the calf are noted. Left common femoral and profunda femoral artery are patent. Left superficial femoral artery is patent. Left popliteal artery is patent. There is 3 vessel runoff to the distal leg. Tiny hypodensity towards the dome of the liver at table position -15 is nonspecific. Gallbladder, spleen, pancreas, adrenal glands, and kidneys are within normal limits. No evidence of bowel obstruction. Normal appendix. No focal mass of the colon. Prominent stool burden in the colon. Bladder, uterus, and adnexa are within normal limits No free-fluid.  No obvious retroperitoneal adenopathy. There is an L1 compression fracture with 30% loss of height anteriorly. It has a chronic appearance. Review of the MIP images confirms the above findings. IMPRESSION: No significant obstruction of the aorta or iliac arterial system The right superficial femoral artery and popliteal artery are occluded. The right femoral to distal bypass graft is occluded. The major branch of the right profunda femoral artery is also occluded. Small profunda femoral an muscular branches eventually reconstitute the anterior and posterior tibial arteries. Left lower extremity arterial system is patent with 3 vessel runoff to the distal leg The origins of  the IMA and SMA are occluded. These vessels reconstitute via collaterals from the celiac artery which is patent. Tiny hypodensity in the liver is nonspecific. If there is a history of malignancy or a there is high risk for malignancy, six-month followup MRI is recommended. L1 compression fracture as described. There are varicosities in the right calf. Venous insufficiency is not excluded. Electronically Signed   By: Marybelle Killings M.D.   On: 02/05/2016 14:09     IMPRESSION AND PLAN:   65 year old female with past medical history of peripheral  vascular disease, osteoporosis, peptic ulcer disease, depression, hyperlipidemia, who presents to the hospital due to right lower extremity pain and cyanotic appearing right lower extremity is noted to have acute PVD with acute superficial femoral artery and popliteal artery occlusions.  1. Acute peripheral vascular disease-patient was noted to have acute superficial femoral artery on the right and also popliteal artery occlusions. 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 -Vascular surgery has been consulted and they will start patient on heparin. -Continue supportive care and further care as per vascular surgery.  2. Chest pain-patient has had intermittent chest pain for the past week or so.  - scheduled for stress test and echo as outpatient next week.  - Vascular surgery to consult Cardiology and pt. May need stress test here before angiogram.  - I will order a 2-D echo.  ECG showing NSR.   3. Hx of GERD/PUD - cont. Protonix.  - watch for bleeding as pt. Is going to be on heparin gtt. Vascular to consult GI possibly.   4. Hyperlipidemia - cont. Atorvastatin  5. Depression - cont. Effexor, Wellbutrin  6. Osteoporosis - cont. Denosumab.    Thank you so much for the consultation and we will follow along with you.  All the records are reviewed and case discussed with Consulting provider. Management plans discussed with the patient, family and they are in agreement.  CODE STATUS: Full  TOTAL TIME TAKING CARE OF THIS PATIENT: 45 minutes.    Henreitta Leber M.D on 02/05/2016 at 4:50 PM  Between 7am to 6pm - Pager - [phone]  After 6pm go to [email or web address] - password [email or web address] [facility]  Office  [phone]  CC: Primary care Physician: Leata Mouse, NP

## 2016-02-05 NOTE — Progress Notes (Signed)
Pt came to floor at 1830. VSS. Pt was Oriented to room and safety plan. Yellow safety socks on feet.

## 2016-02-06 ENCOUNTER — Inpatient Hospital Stay (HOSPITAL_COMMUNITY)
Admit: 2016-02-06 | Discharge: 2016-02-06 | Disposition: A | Discharge status: Home / Self Care | Payer: Medicare Other | Attending: Specialist | Admitting: Specialist

## 2016-02-06 DIAGNOSIS — I700 Atherosclerosis of aorta: Secondary | ICD-10-CM | POA: Insufficient documentation

## 2016-02-06 DIAGNOSIS — E785 Hyperlipidemia, unspecified: Secondary | ICD-10-CM | POA: Insufficient documentation

## 2016-02-06 DIAGNOSIS — R079 Chest pain, unspecified: Secondary | ICD-10-CM | POA: Insufficient documentation

## 2016-02-06 DIAGNOSIS — Z720 Tobacco use: Secondary | ICD-10-CM

## 2016-02-06 DIAGNOSIS — I340 Nonrheumatic mitral (valve) insufficiency: Secondary | ICD-10-CM

## 2016-02-06 DIAGNOSIS — I749 Embolism and thrombosis of unspecified artery: Secondary | ICD-10-CM

## 2016-02-06 DIAGNOSIS — I7090 Unspecified atherosclerosis: Secondary | ICD-10-CM | POA: Insufficient documentation

## 2016-02-06 DIAGNOSIS — Z87891 Personal history of nicotine dependence: Secondary | ICD-10-CM | POA: Insufficient documentation

## 2016-02-06 DIAGNOSIS — I998 Other disorder of circulatory system: Secondary | ICD-10-CM | POA: Insufficient documentation

## 2016-02-06 LAB — BASIC METABOLIC PANEL
ANION GAP: 8 (ref 5–15)
BUN: 11 mg/dL (ref 6–20)
CALCIUM: 8.7 mg/dL — AB (ref 8.9–10.3)
CO2: 23 mmol/L (ref 22–32)
CREATININE: 0.96 mg/dL (ref 0.44–1.00)
Chloride: 105 mmol/L (ref 101–111)
Glucose, Bld: 104 mg/dL — ABNORMAL HIGH (ref 65–99)
Potassium: 3.9 mmol/L (ref 3.5–5.1)
SODIUM: 136 mmol/L (ref 135–145)

## 2016-02-06 LAB — ECHOCARDIOGRAM COMPLETE
AOPV: 0.79 m/s
AV peak Index: 1.16
AV pk vel: 113 cm/s
AVAREAVTI: 2.01 cm2
AVPG: 5 mmHg
Ao-asc: 34 cm
CHL CUP MV DEC (S): 197
E decel time: 197 msec
FS: 29 % (ref 28–44)
Height: 64 in
IVS/LV PW RATIO, ED: 1.07
LA diam end sys: 31 mm
LA vol index: 16.3 mL/m2
LADIAMINDEX: 1.79 cm/m2
LASIZE: 31 mm
LAVOL: 28.2 mL
LAVOLA4C: 28.5 mL
LDCA: 2.54 cm2
LVOT diameter: 18 mm
LVOT peak vel: 89.2 cm/s
MV M vel: 58.1
MVANNULUSVTI: 23.9 cm
MVAP: 2.89 cm2
MVG: 2 mmHg
MVPKAVEL: 81 m/s
MVPKEVEL: 67.4 m/s
MVSPHT: 76 ms
PW: 9.6 mm — AB (ref 0.6–1.1)
Weight: 2409.6 oz

## 2016-02-06 LAB — CBC
HCT: 41.9 % (ref 35.0–47.0)
HCT: 42.8 % (ref 35.0–47.0)
HEMOGLOBIN: 14.2 g/dL (ref 12.0–16.0)
HEMOGLOBIN: 14 g/dL (ref 12.0–16.0)
MCH: 31.2 pg (ref 26.0–34.0)
MCH: 31.2 pg (ref 26.0–34.0)
MCHC: 33.1 g/dL (ref 32.0–36.0)
MCHC: 33.3 g/dL (ref 32.0–36.0)
MCV: 93.7 fL (ref 80.0–100.0)
MCV: 94.2 fL (ref 80.0–100.0)
PLATELETS: 190 10*3/uL (ref 150–440)
PLATELETS: 185 10*3/uL (ref 150–440)
RBC: 4.54 MIL/uL (ref 3.80–5.20)
RBC: 4.48 MIL/uL (ref 3.80–5.20)
RDW: 13.4 % (ref 11.5–14.5)
RDW: 13.7 % (ref 11.5–14.5)
WBC: 10.7 10*3/uL (ref 3.6–11.0)
WBC: 10.9 10*3/uL (ref 3.6–11.0)

## 2016-02-06 LAB — HEPARIN LEVEL (UNFRACTIONATED)
HEPARIN UNFRACTIONATED: 0.72 [IU]/mL — AB (ref 0.30–0.70)
HEPARIN UNFRACTIONATED: 0.65 [IU]/mL (ref 0.30–0.70)
HEPARIN UNFRACTIONATED: 0.61 [IU]/mL (ref 0.30–0.70)
HEPARIN UNFRACTIONATED: 1.22 [IU]/mL — AB (ref 0.30–0.70)

## 2016-02-06 MED ORDER — BISACODYL 5 MG PO TBEC
10.0000 mg | DELAYED_RELEASE_TABLET | Freq: Every day | ORAL | Status: DC | PRN
  Administered 2016-02-06: 10 mg via ORAL
  Filled 2016-02-06 (×2): qty 2

## 2016-02-06 MED ORDER — HEPARIN (PORCINE) IN NACL 100-0.45 UNIT/ML-% IJ SOLN
850.0000 [IU]/h | INTRAMUSCULAR | Status: DC
  Administered 2016-02-06: 850 [IU]/h via INTRAVENOUS
  Administered 2016-02-06: 900 [IU]/h via INTRAVENOUS
  Administered 2016-02-07 – 2016-02-09 (×2): 850 [IU]/h via INTRAVENOUS
  Filled 2016-02-06 (×3): qty 250

## 2016-02-06 NOTE — Progress Notes (Addendum)
[facility] [place] NAME: Sara [name]    MR#:  [id number]  DATE OF BIRTH:  03-Jun-1951  SUBJECTIVE:  CHIEF COMPLAINT:   Chief Complaint  Patient presents with  . Leg Pain   Right leg and foot pain. Cough. REVIEW OF SYSTEMS:  CONSTITUTIONAL: No fever, fatigue or weakness.  EYES: No blurred or double vision.  EARS, NOSE, AND THROAT: No tinnitus or ear pain.  RESPIRATORY: has cough, no shortness of breath, wheezing or hemoptysis.  CARDIOVASCULAR: No chest pain, orthopnea, edema.  GASTROINTESTINAL: No nausea, vomiting, diarrhea or abdominal pain.  GENITOURINARY: No dysuria, hematuria.  ENDOCRINE: No polyuria, nocturia,  HEMATOLOGY: No anemia, easy bruising or bleeding SKIN: No rash or lesion. MUSCULOSKELETAL: Right leg and foot pain.   NEUROLOGIC: No tingling, numbness, weakness.  PSYCHIATRY: No anxiety or depression.   DRUG ALLERGIES:   Allergies  Allergen Reactions  . Amoxicillin Other (See Comments)    Yeast infection  . Vicodin [Hydrocodone-Acetaminophen] Hives and Rash    VITALS:  Blood pressure 110/76, pulse 98, temperature 98.2 F (36.8 C), temperature source Oral, resp. rate 17, height 5\' 4"  (1.626 m), weight 150 lb 9.6 oz (68.312 kg), SpO2 96 %.  PHYSICAL EXAMINATION:  GENERAL:  65 y.o.-year-old patient lying in the bed with no acute distress.  EYES: Pupils equal, round, reactive to light and accommodation. No scleral icterus. Extraocular muscles intact.  HEENT: Head atraumatic, normocephalic. Oropharynx and nasopharynx clear.  NECK:  Supple, no jugular venous distention. No thyroid enlargement, no tenderness.  LUNGS: Normal breath sounds bilaterally, no wheezing, rales,rhonchi or crepitation. No use of accessory muscles of respiration.  CARDIOVASCULAR: S1, S2 normal. No murmurs, rubs, or gallops.  ABDOMEN: Soft, nontender, nondistended. Bowel sounds present. No organomegaly or mass.  EXTREMITIES: No pedal  edema, cyanosis, or clubbing. dusky-appearing right lower extremity with poor palpable pulses.  NEUROLOGIC: Cranial nerves II through XII are intact. Muscle strength 5/5 in all extremities. Sensation intact. Gait not checked.  PSYCHIATRIC: The patient is alert and oriented x 3.  SKIN: No obvious rash, lesion, or ulcer.    LABORATORY PANEL:   CBC  Recent Labs Lab 02/06/16 0851  WBC 10.7  HGB 14.2  HCT 42.8  PLT 190   ------------------------------------------------------------------------------------------------------------------  Chemistries   Recent Labs Lab 02/05/16 1221 02/06/16 0024  NA 138 136  K 3.6 3.9  CL 103 105  CO2 25 23  GLUCOSE 106* 104*  BUN 12 11  CREATININE 1.16* 0.96  CALCIUM 8.9 8.7*  AST 28  --   ALT 32  --   ALKPHOS 52  --   BILITOT 0.1*  --    ------------------------------------------------------------------------------------------------------------------  Cardiac Enzymes No results for input(s): TROPONINI in the last 168 hours. ------------------------------------------------------------------------------------------------------------------  RADIOLOGY:  Ct Angio Ao+bifem W/cm &/or Wo/cm  02/05/2016  CLINICAL DATA:  Right lower extremity pain yesterday. Ischemic right foot. History of right lower extremity arterial bypass procedure x2. EXAM: CT ANGIOGRAPHY OF ABDOMINAL AORTA WITH ILIOFEMORAL RUNOFF TECHNIQUE: Multidetector CT imaging of the abdomen, pelvis and lower extremities was performed using the standard protocol during bolus administration of intravenous contrast. Multiplanar CT image reconstructions and MIPs were obtained to evaluate the vascular anatomy. CONTRAST:  125 cc Isovue 370 COMPARISON:  None. FINDINGS: Aorta is non aneurysmal and patent with minimal smooth plaque in the infrarenal aorta. Celiac is patent.  Branch vessels are patent. SMA origin is occluded. Beyond the origin, the trunk reconstitutes via markedly hypertrophied  pancreaticoduodenal collateral  arteries. IMA origin is occluded.  Branches reconstitute Two right renal arteries and a single left renal artery are patent Mild atherosclerotic changes of the right common iliac artery. Right internal and external iliac artery are patent. Mild calcified plaque in the distal left common iliac artery. It is patent. Left internal and external iliac arteries are patent. The right common femoral artery is patent. The right superficial femoral artery is diminutive then becomes occluded in the adductor canal. A femoral popliteal bypass graft is occluded. The main profunda femoral artery is patent. The major branch of the profunda femoral artery is occluded at table position 465. Smaller profunda femoral artery and muscular branches remain patent. The popliteal artery is occluded. The posterior tibial and anterior tibial artery reconstitutes in are patent to the distal leg. The peroneal artery intermittently opacifies. Varicosities in the calf are noted. Left common femoral and profunda femoral artery are patent. Left superficial femoral artery is patent. Left popliteal artery is patent. There is 3 vessel runoff to the distal leg. Tiny hypodensity towards the dome of the liver at table position -15 is nonspecific. Gallbladder, spleen, pancreas, adrenal glands, and kidneys are within normal limits. No evidence of bowel obstruction. Normal appendix. No focal mass of the colon. Prominent stool burden in the colon. Bladder, uterus, and adnexa are within normal limits No free-fluid.  No obvious retroperitoneal adenopathy. There is an L1 compression fracture with 30% loss of height anteriorly. It has a chronic appearance. Review of the MIP images confirms the above findings. IMPRESSION: No significant obstruction of the aorta or iliac arterial system The right superficial femoral artery and popliteal artery are occluded. The right femoral to distal bypass graft is occluded. The major branch of the  right profunda femoral artery is also occluded. Small profunda femoral an muscular branches eventually reconstitute the anterior and posterior tibial arteries. Left lower extremity arterial system is patent with 3 vessel runoff to the distal leg The origins of the IMA and SMA are occluded. These vessels reconstitute via collaterals from the celiac artery which is patent. Tiny hypodensity in the liver is nonspecific. If there is a history of malignancy or a there is high risk for malignancy, six-month followup MRI is recommended. L1 compression fracture as described. There are varicosities in the right calf. Venous insufficiency is not excluded. Electronically Signed   By: Marybelle Killings M.D.   On: 02/05/2016 14:09    EKG:   Orders placed or performed in visit on 01/27/16  . EKG 12-Lead    ASSESSMENT AND PLAN:   65 year old female with past medical history of peripheral vascular disease, osteoporosis, peptic ulcer disease, depression, hyperlipidemia, who presents to the hospital due to right lower extremity pain and cyanotic appearing right lower extremity is noted to have acute PVD with acute superficial femoral artery and popliteal artery occlusions.  1. Acute peripheral vascular disease-patient was noted to have acute superficial femoral artery on the right and also popliteal artery occlusions. 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 Continue heparin drip, pain control and follow-up Dr. Delana Meyer for possible angiography on Tuesday.  2. Chest pain-patient has had intermittent chest pain for the past week or so. No chest pain today. Per Dr. Rockey Situ,  stress test tomorrow and get echo.   3. Hx of GERD/PUD - cont. Protonix bid. Hemoglobin is stable. - watch for bleeding on heparin gtt. Possible EGD tomorrow.  4. Hyperlipidemia - cont. Atorvastatin  5. Depression - cont.  Effexor, Wellbutrin  6. Osteoporosis - cont. Denosumab.    URI. Tessalon and Robitussin when necessary.  He discussed with Dr. Rockey Situ. All the records are reviewed and case discussed with Care Management/Social Workerr. Management plans discussed with the patient, family and they are in agreement.  CODE STATUS: Full code  TOTAL TIME TAKING CARE OF THIS PATIENT: 35 minutes.  Greater than 50% time was spent on coordination of care and face-to-face counseling.  POSSIBLE D/C IN ? DAYS, DEPENDING ON CLINICAL CONDITION.   Demetrios Loll M.D on 02/06/2016 at 3:16 PM  Between 7am to 6pm - Pager - [phone]  After 6pm go to [email or web address] - password [email or web address] [facility]  [facility]  Office  [phone]  CC: Primary care physician; Amy Annamary Rummage, NP

## 2016-02-06 NOTE — Consult Note (Signed)
ANTICOAGULATION CONSULT NOTE - Initial Consult  Pharmacy Consult for heparin drip Indication: thrombosis  Allergies  Allergen Reactions  . Amoxicillin Other (See Comments)    Yeast infection  . Vicodin [Hydrocodone-Acetaminophen] Hives and Rash    Patient Measurements: Height: 5\' 4"  (162.6 cm) Weight: 150 lb 9.6 oz (68.312 kg) IBW/kg (Calculated) : 54.7 Heparin Dosing Weight: 63.5kg  Vital Signs:    Labs:  Recent Labs  02/05/16 1221 02/05/16 1832 02/06/16 0024 02/06/16 0851  HGB 14.7  --  14.0 14.2  HCT 43.5  --  41.9 42.8  PLT 200  --  185 190  APTT  --  >160*  --   --   LABPROT 12.7  --   --   --   INR 0.93  --   --   --   HEPARINUNFRC  --   --  1.22* 0.72*  CREATININE 1.16*  --  0.96  --     Estimated Creatinine Clearance: 56.9 mL/min (by C-G formula based on Cr of 0.96).   Medical History: Past Medical History  Diagnosis Date  . GERD (gastroesophageal reflux disease)   . Osteoporosis   . Vascular disease     Sees Dr. Delana Meyer  . Vertigo     Last episode approx Aug 2015  . Depression   . History of blood clots   . Stomach ulcer     Medications:  Scheduled:  . atorvastatin  10 mg Oral Daily  . buPROPion  150 mg Oral Daily  . calcium-vitamin D  3 tablet Oral Daily  . dextromethorphan  30 mg Oral BID   And  . guaiFENesin  600 mg Oral BID  . docusate sodium  100 mg Oral BID  . pantoprazole  40 mg Oral BID  . predniSONE  40 mg Oral Q breakfast  . sodium chloride flush  3 mL Intravenous Q12H  . 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 Pharmacy consulted to dose heparin drip. Per Dr. Delana Meyer pt has no CI to full anticoagulation and ok to use nomogram  Goal of Therapy:  Heparin level 0.3-0.7 units/ml Monitor platelets by anticoagulation protocol: Yes   Plan:  Current orders for heparin 900 units/hr, heparin level slightly above goal.  Will decrease to 850 units/hr and recheck heparin level in 6 hours.  CBC with AM labs  Yareni Creps C 02/06/2016,10:15 AM

## 2016-02-06 NOTE — Consult Note (Signed)
ANTICOAGULATION CONSULT NOTE - Initial Consult  Pharmacy Consult for heparin drip Indication: thrombosis  Allergies  Allergen Reactions  . Amoxicillin Other (See Comments)    Yeast infection  . Vicodin [Hydrocodone-Acetaminophen] Hives and Rash    Patient Measurements: Height: 5\' 4"  (162.6 cm) Weight: 146 lb 6.4 oz (66.407 kg) IBW/kg (Calculated) : 54.7 Heparin Dosing Weight: 63.5kg  Vital Signs: Temp: 98.4 F (36.9 C) (06/17 2056) Temp Source: Oral (06/17 2056) BP: 106/85 mmHg (06/17 2056) Pulse Rate: 87 (06/17 2056)  Labs:  Recent Labs  02/05/16 1221 02/05/16 1832 02/06/16 0024  HGB 14.7  --  14.0  HCT 43.5  --  41.9  PLT 200  --  185  APTT  --  >160*  --   LABPROT 12.7  --   --   INR 0.93  --   --   HEPARINUNFRC  --   --  1.22*  CREATININE 1.16*  --  0.96    Estimated Creatinine Clearance: 56.2 mL/min (by C-G formula based on Cr of 0.96).   Medical History: Past Medical History  Diagnosis Date  . GERD (gastroesophageal reflux disease)   . Osteoporosis   . Vascular disease     Sees Dr. Delana Meyer  . Vertigo     Last episode approx Aug 2015  . Depression   . History of blood clots   . Stomach ulcer     Medications:  Scheduled:  . atorvastatin  10 mg Oral Daily  . buPROPion  150 mg Oral Daily  . calcium-vitamin D  3 tablet Oral Daily  . dextromethorphan  30 mg Oral BID   And  . guaiFENesin  600 mg Oral BID  . docusate sodium  100 mg Oral BID  . pantoprazole  40 mg Oral BID  . predniSONE  40 mg Oral Q breakfast  . sodium chloride flush  3 mL Intravenous Q12H  . 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 Pharmacy consulted to dose heparin drip. Per Dr. Delana Meyer pt has no CI to full anticoagulation and ok to use nomogram  Goal of Therapy:  Heparin level 0.3-0.7 units/ml Monitor platelets by anticoagulation protocol: Yes   Plan:  Give  3200 units bolus x 1 Start heparin infusion at 1100 units/hr Check anti-Xa level in 6 hours and daily while on heparin Continue to monitor H&H and platelets   6/18 00:30 heparin level 1.22. Hold drip x 1 hour and restart at 900 units/hr. Recheck heparin level 6 hours after restart.  Cas Tracz S 02/06/2016,1:28 AM

## 2016-02-06 NOTE — Consult Note (Signed)
Cardiology Consultation Note  Patient ID: Sara [name], MRN: [id number], DOB/AGE: July 11, 1951 65 y.o. Admit date: 02/05/2016   Date of Consult: 02/06/2016 Primary Physician: Leata Mouse, NP Primary Cardiologist: Wende Bushy  Chief Complaint: Right leg pain Reason for Consult: Preoperative evaluation, chest pain  HPI: 65 y.o. female with history of peripheral arterial disease, right lower extremity femoropopliteal bypass in 1991, redo in 2012, recently evaluated in cardiology clinic for chest pain symptoms, scheduled for stress test this Thursday who presents to the hospital with acute onset of right lower extremity pain. Cardiology was consult in for preoperative evaluation prior to angiography procedure and for recent symptoms of chest pain  She reports having symptoms of chest pain in May 2017, waking her at nighttime, lasting for 1 minute at a time She reports having 4-5 episodes. Perhaps had one episode with exertion, but most of her episodes woke her from sleep. In the past month, no further chest pain symptoms. Otherwise has been very active  Acute onset of severe right leg pain starting yesterday, pain is improved currently at rest though if she walks to the bathroom, she starts to have pain down her shin and eventually into her foot. CT scan of the lower extremities confirming occluded femoropopliteal bypass graft Seen by Dr. Ronalee Belts and recommendation has been made for angiographic procedure   She denies shortness breath, palpitations. No fever, cough, colds, abdominal pain. No PND, orthopnea.     Past Medical History  Diagnosis Date  . GERD (gastroesophageal reflux disease)   . Osteoporosis   . Vascular disease     Sees Dr. Delana Meyer  . Vertigo     Last episode approx Aug 2015  . Depression   . History of blood clots   . Stomach ulcer       Most Recent Cardiac Studies: Echocardiogram; normal LV function greater than 55%    Surgical History:  Past Surgical History    Procedure Laterality Date  . Vascular surgery  [id number]    Fem-Pop Bypass  . Hemorroidectomy  2014  . Esophagogastroduodenoscopy N/A 12/21/2014    Procedure: ESOPHAGOGASTRODUODENOSCOPY (EGD);  Surgeon: Lucilla Lame, MD;  Location: [facility];  Service: Gastroenterology;  Laterality: N/A;  . Tonsilectomy, adenoidectomy, bilateral myringotomy and tubes  1968     Home Meds: Prior to Admission medications   Medication Sig Start Date End Date Taking? Authorizing Provider  albuterol (PROVENTIL HFA;VENTOLIN HFA) 108 (90 Base) MCG/ACT inhaler Inhale 2 puffs into the lungs every 6 (six) hours as needed for wheezing or shortness of breath. 01/31/16  Yes Amy Overton Mam, NP  atorvastatin (LIPITOR) 10 MG tablet Take 1 tablet (10 mg total) by mouth daily. 01/04/16  Yes Amy Overton Mam, NP  benzonatate (TESSALON) 100 MG capsule Take 1 capsule (100 mg total) by mouth 3 (three) times daily as needed. 01/31/16  Yes Amy Overton Mam, NP  buPROPion (WELLBUTRIN XL) 150 MG 24 hr tablet Take 1 tablet (150 mg total) by mouth daily. 09/06/15  Yes Amy Overton Mam, NP  Calcium Carbonate-Vitamin D (CALCIUM-VITAMIN D) 500-200 MG-UNIT tablet Take 3 tablets by mouth daily.   Yes Historical Provider, MD  clobetasol cream (TEMOVATE) [id number] % Apply 1 application topically 2 (two) times daily. 12/06/15  Yes Amy Overton Mam, NP  denosumab (PROLIA) 60 MG/ML SOLN injection Inject 60 mg into the skin every 6 (six) months. Administer in upper arm, thigh, or abdomen 01/31/16  Yes Amy Overton Mam, NP  dextromethorphan-guaiFENesin (MUCINEX DM) 30-600 MG 12hr tablet  Take 1 tablet by mouth 2 (two) times daily. 01/31/16  Yes Amy Overton Mam, NP  Melatonin 1 MG TABS Take 1 tablet (1 mg total) by mouth Nightly. 12/28/15  Yes Amy Overton Mam, NP  Multiple Vitamin (MULTIVITAMIN) capsule Take 1 capsule by mouth daily. PM   Yes Historical Provider, MD  nitroGLYCERIN (NITROSTAT) 0.4 MG SL tablet Place 1 tablet (0.4 mg total) under the  tongue every 5 (five) minutes as needed for chest pain. 01/27/16  Yes Wende Bushy, MD  pantoprazole (PROTONIX) 40 MG tablet Take 1 tablet (40 mg total) by mouth 2 (two) times daily. 09/06/15  Yes Amy Overton Mam, NP  predniSONE (DELTASONE) 20 MG tablet Take 2 tablets (40 mg total) by mouth daily with breakfast. 01/31/16  Yes Amy Overton Mam, NP  triamcinolone cream (KENALOG) 0.1 % Apply 1 application topically 2 (two) times daily. 10/11/15  Yes Amy Overton Mam, NP  venlafaxine XR (EFFEXOR-XR) 75 MG 24 hr capsule Take 1 capsule (75 mg total) by mouth daily with breakfast. 09/06/15  Yes Amy Overton Mam, NP    Inpatient Medications:  . atorvastatin  10 mg Oral Daily  . buPROPion  150 mg Oral Daily  . calcium-vitamin D  3 tablet Oral Daily  . dextromethorphan  30 mg Oral BID   And  . guaiFENesin  600 mg Oral BID  . docusate sodium  100 mg Oral BID  . pantoprazole  40 mg Oral BID  . predniSONE  40 mg Oral Q breakfast  . sodium chloride flush  3 mL Intravenous Q12H  . venlafaxine XR  75 mg Oral Q breakfast   . heparin 850 Units/hr (02/06/16 1049)    Allergies:  Allergies  Allergen Reactions  . Amoxicillin Other (See Comments)    Yeast infection  . Vicodin [Hydrocodone-Acetaminophen] Hives and Rash    Social History   Social History  . Marital Status: Single    Spouse Name: N/A  . Number of Children: N/A  . Years of Education: N/A   Occupational History  . Not on file.   Social History Main Topics  . Smoking status: Former Smoker -- 2.00 packs/day for 25 years    Types: Cigarettes    Quit date: 08/22/1991  . Smokeless tobacco: Never Used  . Alcohol Use: No  . Drug Use: No  . Sexual Activity: Not on file   Other Topics Concern  . Not on file   Social History Narrative     Family History  Problem Relation Age of Onset  . Cancer Father     colon cancer  . Heart disease Maternal Uncle   . CVA Mother   . Heart attack Mother   . Colon cancer Father      Review of  Systems: Review of Systems  Constitutional: Negative.   Respiratory: Negative.   Cardiovascular: Negative.        No chest pain for the past month  Gastrointestinal: Negative.   Musculoskeletal: Negative.        Right leg pain on exertion  Neurological: Negative.   Psychiatric/Behavioral: Negative.   All other systems reviewed and are negative.   Labs: No results for input(s): CKTOTAL, CKMB, TROPONINI in the last 72 hours. Lab Results  Component Value Date   WBC 10.7 02/06/2016   HGB 14.2 02/06/2016   HCT 42.8 02/06/2016   MCV 94.2 02/06/2016   PLT 190 02/06/2016    Recent Labs Lab 02/05/16 1221 02/06/16 0024  NA 138 136  K 3.6 3.9  CL 103 105  CO2 25 23  BUN 12 11  CREATININE 1.16* 0.96  CALCIUM 8.9 8.7*  PROT 7.2  --   BILITOT 0.1*  --   ALKPHOS 52  --   ALT 32  --   AST 28  --   GLUCOSE 106* 104*   Lab Results  Component Value Date   CHOL 164 09/21/2015   HDL 85 09/21/2015   LDLCALC 68 09/21/2015   TRIG 57 09/21/2015   No results found for: DDIMER  Radiology/Studies:  Ct Angio Ao+bifem W/cm &/or Wo/cm  02/05/2016  CLINICAL DATA:  Right lower extremity pain yesterday. Ischemic right foot. History of right lower extremity arterial bypass procedure x2. EXAM: CT ANGIOGRAPHY OF ABDOMINAL AORTA WITH ILIOFEMORAL RUNOFF TECHNIQUE: Multidetector CT imaging of the abdomen, pelvis and lower extremities was performed using the standard protocol during bolus administration of intravenous contrast. Multiplanar CT image reconstructions and MIPs were obtained to evaluate the vascular anatomy. CONTRAST:  125 cc Isovue 370 COMPARISON:  None. FINDINGS: Aorta is non aneurysmal and patent with minimal smooth plaque in the infrarenal aorta. Celiac is patent.  Branch vessels are patent. SMA origin is occluded. Beyond the origin, the trunk reconstitutes via markedly hypertrophied pancreaticoduodenal collateral arteries. IMA origin is occluded.  Branches reconstitute Two right renal  arteries and a single left renal artery are patent Mild atherosclerotic changes of the right common iliac artery. Right internal and external iliac artery are patent. Mild calcified plaque in the distal left common iliac artery. It is patent. Left internal and external iliac arteries are patent. The right common femoral artery is patent. The right superficial femoral artery is diminutive then becomes occluded in the adductor canal. A femoral popliteal bypass graft is occluded. The main profunda femoral artery is patent. The major branch of the profunda femoral artery is occluded at table position 465. Smaller profunda femoral artery and muscular branches remain patent. The popliteal artery is occluded. The posterior tibial and anterior tibial artery reconstitutes in are patent to the distal leg. The peroneal artery intermittently opacifies. Varicosities in the calf are noted. Left common femoral and profunda femoral artery are patent. Left superficial femoral artery is patent. Left popliteal artery is patent. There is 3 vessel runoff to the distal leg. Tiny hypodensity towards the dome of the liver at table position -15 is nonspecific. Gallbladder, spleen, pancreas, adrenal glands, and kidneys are within normal limits. No evidence of bowel obstruction. Normal appendix. No focal mass of the colon. Prominent stool burden in the colon. Bladder, uterus, and adnexa are within normal limits No free-fluid.  No obvious retroperitoneal adenopathy. There is an L1 compression fracture with 30% loss of height anteriorly. It has a chronic appearance. Review of the MIP images confirms the above findings. IMPRESSION: No significant obstruction of the aorta or iliac arterial system The right superficial femoral artery and popliteal artery are occluded. The right femoral to distal bypass graft is occluded. The major branch of the right profunda femoral artery is also occluded. Small profunda femoral an muscular branches eventually  reconstitute the anterior and posterior tibial arteries. Left lower extremity arterial system is patent with 3 vessel runoff to the distal leg The origins of the IMA and SMA are occluded. These vessels reconstitute via collaterals from the celiac artery which is patent. Tiny hypodensity in the liver is nonspecific. If there is a history of malignancy or a there is high risk for malignancy, six-month followup MRI is recommended. L1 compression  fracture as described. There are varicosities in the right calf. Venous insufficiency is not excluded. Electronically Signed   By: Marybelle Killings M.D.   On: 02/05/2016 14:09    EKG: Most recent EKG 01/27/2016 showing normal sinus rhythm, no significant ST or T-wave changes  Weights: Filed Weights   02/05/16 1159 02/05/16 1833 02/05/16 2056  Weight: 140 lb (63.504 kg) 146 lb 6.4 oz (66.407 kg) 150 lb 9.6 oz (68.312 kg)     Physical Exam: Blood pressure 110/76, pulse 98, temperature 98.2 F (36.8 C), temperature source Oral, resp. rate 17, height 5\' 4"  (1.626 m), weight 150 lb 9.6 oz (68.312 kg), SpO2 96 %. Body mass index is 25.84 kg/(m^2). General: Well developed, well nourished, in no acute distress. Head: Normocephalic, atraumatic, sclera non-icteric, no xanthomas, nares are without discharge.  Neck: Negative for carotid bruits. JVD not elevated. Lungs: Clear bilaterally to auscultation without wheezes, rales, or rhonchi. Breathing is unlabored. Heart: RRR with S1 S2. No murmurs, rubs, or gallops appreciated. No appreciable pulses right lower extremity Abdomen: Soft, non-tender, non-distended with normoactive bowel sounds. No hepatomegaly. No rebound/guarding. No obvious abdominal masses. Msk:  Strength and tone appear normal for age. Extremities: No clubbing or cyanosis. No edema.  Distal pedal pulses are 2+ and equal bilaterally. Neuro: Alert and oriented X 3. No facial asymmetry. No focal deficit. Moves all extremities spontaneously. Psych:  Responds  to questions appropriately with a normal affect.    Assessment and Plan:   1. PAD/ischemic right leg Preoperative evaluation Scheduled for angiographic procedure with Dr. Ronalee Belts possibly on Tuesday   review of CT scan from inferior aspect of heart through the legs shows very mild diffuse atherosclerosis,   suggesting recent episodes of chest pain is likely atypical in nature Though given her underlying PAD and symptoms, why setting would be to perform stress test tomorrow morning to rule out ischemia in preparation for procedure on Tuesday. This had been scheduled for Thursday, we will try to move this up to tomorrow morning  --Echocardiogram showing normal ejection fraction with no significant valvular abnormalities, normal wall motion There does not appear to be a ASD. Bubble study not performed  --Also schedule for GI procedure Monday. We'll try to schedule stress test in early morning.  --She may benefit from Plavix. She reports intolerance to aspirin given severe GI issues, irritation and ulcers Given occluded IMA, SMA, femoropopliteal bypass graft, femoral artery on the right, we will need to be aggressive with her anticoagulation --She is currently on heparin infusion  --Could consider 30 day monitor to rule out atrial fibrillation and possible embolic phenomenon  2) Occluded IMA and SMA Etiology unclear, given normal cardiac function, less likely thrombus though will need to exclude arrhythmia/atrial fibrillation and embolic phenomenon. --Consider 30 day monitor to rule out embolic phenomenon, atrial fibrillation  3) hyperlipidemia Would continue on Lipitor  4) GERD Esophageal ulcers seen on EGD May 2016 with oozing blood Repeat EGD scheduled  5) osteoporosis Managed by primary care  6) chest pain Stress test scheduled for tomorrow as detailed above, atypical features Given arterial occlusions of unclear etiology, will need to monitor closely Very mild diffuse  atherosclerosis on CT scan, PAD out of portion to her atherosclerosis   Total encounter time more than 80 minutes  Greater than 50% was spent in counseling and coordination of care with the patient    Signed, Esmond Plants, MD. Ph.D [facility] 02/06/2016, 3:00 PM

## 2016-02-06 NOTE — Consult Note (Addendum)
ANTICOAGULATION CONSULT NOTE - Initial Consult  Pharmacy Consult for heparin drip Indication: thrombosis  Allergies  Allergen Reactions  . Amoxicillin Other (See Comments)    Yeast infection  . Vicodin [Hydrocodone-Acetaminophen] Hives and Rash    Patient Measurements: Height: 5\' 4"  (162.6 cm) Weight: 150 lb 9.6 oz (68.312 kg) IBW/kg (Calculated) : 54.7 Heparin Dosing Weight: 63.5kg  Vital Signs: Temp: 98.2 F (36.8 C) (06/18 1239) BP: 110/76 mmHg (06/18 1239) Pulse Rate: 98 (06/18 1239)  Labs:  Recent Labs  02/05/16 1221 02/05/16 1832 02/06/16 0024 02/06/16 0851 02/06/16 1645  HGB 14.7  --  14.0 14.2  --   HCT 43.5  --  41.9 42.8  --   PLT 200  --  185 190  --   APTT  --  >160*  --   --   --   LABPROT 12.7  --   --   --   --   INR 0.93  --   --   --   --   HEPARINUNFRC  --   --  1.22* 0.72* 0.65  CREATININE 1.16*  --  0.96  --   --     Estimated Creatinine Clearance: 56.9 mL/min (by C-G formula based on Cr of 0.96).   Medical History: Past Medical History  Diagnosis Date  . GERD (gastroesophageal reflux disease)   . Osteoporosis   . Vascular disease     Sees Dr. Delana Meyer  . Vertigo     Last episode approx Aug 2015  . Depression   . History of blood clots   . Stomach ulcer     Medications:  Scheduled:  . atorvastatin  10 mg Oral Daily  . buPROPion  150 mg Oral Daily  . calcium-vitamin D  3 tablet Oral Daily  . dextromethorphan  30 mg Oral BID   And  . guaiFENesin  600 mg Oral BID  . docusate sodium  100 mg Oral BID  . pantoprazole  40 mg Oral BID  . predniSONE  40 mg Oral Q breakfast  . sodium chloride flush  3 mL Intravenous Q12H  . 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 Pharmacy consulted to dose heparin drip. Per Dr. Delana Meyer pt has no CI to full anticoagulation and ok to use nomogram  Goal of Therapy:  Heparin level 0.3-0.7  units/ml Monitor platelets by anticoagulation protocol: Yes   Plan:  Current orders for heparin 850 units/hr, heparin level is at goal at 0.65. Will continue current rate and recheck heparin level in 6 hours for confirmation.  6/18 23:00 confirmatory heparin level 0.61. Continue current regimen. Recheck heparin level and CBC with tomorrow AM labs.  6/19 AM heparin level 0.54.Continue current regimen.  Recheck CBC and heparin level with tomorrow AM labs.    [name] 02/06/2016,5:52 PM

## 2016-02-06 NOTE — Progress Notes (Signed)
*  PRELIMINARY RESULTS* Echocardiogram 2D Echocardiogram has been performed.  Sara [name] 02/06/2016, 8:52 AM

## 2016-02-06 NOTE — Progress Notes (Signed)
Pt is refusing ted hose on right foot due to pain.

## 2016-02-06 NOTE — Progress Notes (Signed)
[facility]  Daily Progress Note   Subjective  Patient continues to note pain with walking but at rest her foot is stable and not painful. She notes her echo was done this morning. Cardiology evaluation is pending.  Objective Filed Vitals:   02/05/16 1800 02/05/16 1833 02/05/16 2056 02/06/16 1239  BP: 129/76 153/77 106/85 110/76  Pulse: 95 95 87 98  Temp:  98.6 F (37 C) 98.4 F (36.9 C) 98.2 F (36.8 C)  TempSrc:  Oral Oral   Resp: 30 17 20 17   Height:      Weight:  66.407 kg (146 lb 6.4 oz) 68.312 kg (150 lb 9.6 oz)   SpO2: 93% 95% 98% 96%    Intake/Output Summary (Last 24 hours) at 02/06/16 1347 Last data filed at 02/06/16 1200  Gross per 24 hour  Intake 943.92 ml  Output   2525 ml  Net -1581.08 ml    PULM  Normal effort , no use of accessory muscles CV  No JVD, RRR Abd      No distended, nontender VASC  Pedal pulses are nonpalpable bilaterally. Right foot is cool but there is 2 second capillary refill and motor sensory function is intact  Laboratory CBC    Component Value Date/Time   WBC 10.7 02/06/2016 0851   WBC 4.3 09/21/2015 0916   HGB 14.2 02/06/2016 0851   HCT 42.8 02/06/2016 0851   HCT 44.4 09/21/2015 0916   PLT 190 02/06/2016 0851   PLT 222 09/21/2015 0916    BMET    Component Value Date/Time   NA 136 02/06/2016 0024   NA 139 09/21/2015 0916   K 3.9 02/06/2016 0024   CL 105 02/06/2016 0024   CO2 23 02/06/2016 0024   GLUCOSE 104* 02/06/2016 0024   GLUCOSE 85 09/21/2015 0916   BUN 11 02/06/2016 0024   BUN 12 09/21/2015 0916   CREATININE 0.96 02/06/2016 0024   CALCIUM 8.7* 02/06/2016 0024   GFRNONAA >60 02/06/2016 0024   GFRAA >60 02/06/2016 0024    Assessment/Planning: 1. Ischemic right leg: The patient's foot is stable and not in any imminent threat. I will continue the heparin. I will plan for angiography on Tuesday if cardiology evaluation and GI evaluation are completed. 2. Chest pain: Echo is done  cardiology evaluation is pending. Stress test will also be helpful. 3. Peptic ulcer disease and erosive reflux disease: GI will be asked to evaluate and likely she will have an EGD tomorrow. PPI will be continued 4. Hypertension: Her home medications will be continued for now it will be adjusted to keep her in a normotensive range particularly if thrombolytic therapy is required. 5. Reactive airway disease: Medicine has been asked to evaluate the patient and assist with treatment of her pulmonary condition as well as her other medical comorbidities. 6. Hyperlipidemia: cont. Atorvastatin 7. Depression: cont. Effexor, Wellbutrin  [name]  02/06/2016, 1:47 PM

## 2016-02-07 ENCOUNTER — Inpatient Hospital Stay (HOSPITAL_BASED_OUTPATIENT_CLINIC_OR_DEPARTMENT_OTHER): Payer: Medicare Other

## 2016-02-07 DIAGNOSIS — R072 Precordial pain: Secondary | ICD-10-CM

## 2016-02-07 DIAGNOSIS — I209 Angina pectoris, unspecified: Secondary | ICD-10-CM | POA: Diagnosis not present

## 2016-02-07 DIAGNOSIS — K2970 Gastritis, unspecified, without bleeding: Secondary | ICD-10-CM | POA: Insufficient documentation

## 2016-02-07 DIAGNOSIS — K2990 Gastroduodenitis, unspecified, without bleeding: Secondary | ICD-10-CM

## 2016-02-07 LAB — NM MYOCAR MULTI W/SPECT W/WALL MOTION / EF
CHL CUP RESTING HR STRESS: 93 {beats}/min
CSEPHR: 76 %
CSEPPHR: 120 {beats}/min
LV dias vol: 39 mL (ref 46–106)
LVSYSVOL: 16 mL
SDS: 0
SRS: 1
SSS: 6
TID: 0.5

## 2016-02-07 LAB — CBC
HEMATOCRIT: 41.4 % (ref 35.0–47.0)
HEMOGLOBIN: 13.9 g/dL (ref 12.0–16.0)
MCH: 31.7 pg (ref 26.0–34.0)
MCHC: 33.4 g/dL (ref 32.0–36.0)
MCV: 94.8 fL (ref 80.0–100.0)
Platelets: 190 10*3/uL (ref 150–440)
RBC: 4.37 MIL/uL (ref 3.80–5.20)
RDW: 13.5 % (ref 11.5–14.5)
WBC: 12.6 10*3/uL — ABNORMAL HIGH (ref 3.6–11.0)

## 2016-02-07 LAB — HEPARIN LEVEL (UNFRACTIONATED): Heparin Unfractionated: 0.54 IU/mL (ref 0.30–0.70)

## 2016-02-07 LAB — TROPONIN I

## 2016-02-07 MED ORDER — REGADENOSON 0.4 MG/5ML IV SOLN
0.4000 mg | Freq: Once | INTRAVENOUS | Status: AC
  Administered 2016-02-07: 0.4 mg via INTRAVENOUS

## 2016-02-07 MED ORDER — TECHNETIUM TC 99M TETROFOSMIN IV KIT
30.0000 | PACK | Freq: Once | INTRAVENOUS | Status: AC | PRN
  Administered 2016-02-07: 32.784 via INTRAVENOUS

## 2016-02-07 MED ORDER — TECHNETIUM TC 99M TETROFOSMIN IV KIT
13.0000 | PACK | Freq: Once | INTRAVENOUS | Status: AC | PRN
  Administered 2016-02-07: 12.945 via INTRAVENOUS

## 2016-02-07 MED ORDER — CLINDAMYCIN PHOSPHATE 300 MG/50ML IV SOLN
300.0000 mg | INTRAVENOUS | Status: AC
  Filled 2016-02-07: qty 50

## 2016-02-07 NOTE — Consult Note (Signed)
ANTICOAGULATION CONSULT NOTE - Initial Consult  Pharmacy Consult for heparin drip Indication: thrombosis of right leg bypass  Allergies  Allergen Reactions  . Amoxicillin Other (See Comments)    Yeast infection  . Vicodin [Hydrocodone-Acetaminophen] Hives and Rash    Patient Measurements: Height: 5\' 4"  (162.6 cm) Weight: 146 lb 11.2 oz (66.543 kg) IBW/kg (Calculated) : 54.7 Heparin Dosing Weight: 63.5kg  Vital Signs: Temp: 98.3 F (36.8 C) (06/19 0452) Temp Source: Oral (06/19 0452) BP: 110/69 mmHg (06/19 0452) Pulse Rate: 80 (06/19 0452)  Labs:  Recent Labs  02/05/16 1221 02/05/16 1832  02/06/16 0024 02/06/16 0851 02/06/16 1645 02/06/16 2312 02/07/16 0330  HGB 14.7  --   --  14.0 14.2  --   --  13.9  HCT 43.5  --   --  41.9 42.8  --   --  41.4  PLT 200  --   --  185 190  --   --  190  APTT  --  >160*  --   --   --   --   --   --   LABPROT 12.7  --   --   --   --   --   --   --   INR 0.93  --   --   --   --   --   --   --   HEPARINUNFRC  --   --   < > 1.22* 0.72* 0.65 0.61 0.54  CREATININE 1.16*  --   --  0.96  --   --   --   --   < > = values in this interval not displayed.  Estimated Creatinine Clearance: 56.2 mL/min (by C-G formula based on Cr of 0.96).   Medical History: Past Medical History  Diagnosis Date  . GERD (gastroesophageal reflux disease)   . Osteoporosis   . Vascular disease     Sees Dr. Delana Meyer  . Vertigo     Last episode approx Aug 2015  . Depression   . History of blood clots   . Stomach ulcer     Medications:  Scheduled:  . atorvastatin  10 mg Oral Daily  . buPROPion  150 mg Oral Daily  . calcium-vitamin D  3 tablet Oral Daily  . dextromethorphan  30 mg Oral BID   And  . guaiFENesin  600 mg Oral BID  . docusate sodium  100 mg Oral BID  . pantoprazole  40 mg Oral BID  . predniSONE  40 mg Oral Q breakfast  . sodium chloride flush  3 mL Intravenous Q12H  . venlafaxine XR  75 mg Oral Q breakfast    Assessment: Pt is a 65  year old female with a PMH of vascular disease with a thrombosis of right leg bypass. Pharmacy consulted to dose heparin drip. Per Dr. Delana Meyer pt has no CI to full anticoagulation and ok to use nomogram  Goal of Therapy:  Heparin level 0.3-0.7 units/ml Monitor platelets by anticoagulation protocol: Yes   Plan:  Current orders for heparin 850 units/hr, heparin level is at goal at 0.65. Will continue current rate and recheck heparin level in 6 hours for confirmation.  6/18 23:00 confirmatory heparin level 0.61. Continue current regimen. Recheck heparin level and CBC with tomorrow AM labs.  6/19 AM heparin level 0.54.Continue current regimen.  Recheck CBC and heparin level with tomorrow AM labs.    Cordaryl Decelles A 02/07/2016,7:33 AM

## 2016-02-07 NOTE — Progress Notes (Signed)
GI MD paged regarding NPO status. MD stated he would be doing procedures tomorrow (02/08/2016) and that he didn't need her to be NPO today. Will page Dr. Delana Meyer to see if we can advance her diet.

## 2016-02-07 NOTE — Progress Notes (Signed)
[facility]  Daily Progress Note   Subjective  Patient continues to note pain with walking but at rest her foot is stable and not painful.  Cardiology evaluation is complete  Objective Filed Vitals:   02/06/16 2115 02/07/16 0452 02/07/16 0641 02/07/16 1300  BP: 117/76 110/69  115/72  Pulse: 104 80  85  Temp: 98.1 F (36.7 C) 98.3 F (36.8 C)  98.2 F (36.8 C)  TempSrc:  Oral  Oral  Resp: 20 20  19   Height:      Weight:   66.543 kg (146 lb 11.2 oz)   SpO2: 95% 94%  95%    Intake/Output Summary (Last 24 hours) at 02/07/16 2048 Last data filed at 02/07/16 1724  Gross per 24 hour  Intake    640 ml  Output    850 ml  Net   -210 ml    PULM  Normal effort , no use of accessory muscles CV  No JVD, RRR Abd      No distended, nontender VASC  Pedal pulses are nonpalpable bilaterally. Right foot is cool but there is 2 second capillary refill and motor sensory function is intact  Laboratory CBC    Component Value Date/Time   WBC 12.6* 02/07/2016 0330   WBC 4.3 09/21/2015 0916   HGB 13.9 02/07/2016 0330   HCT 41.4 02/07/2016 0330   HCT 44.4 09/21/2015 0916   PLT 190 02/07/2016 0330   PLT 222 09/21/2015 0916    BMET    Component Value Date/Time   NA 136 02/06/2016 0024   NA 139 09/21/2015 0916   K 3.9 02/06/2016 0024   CL 105 02/06/2016 0024   CO2 23 02/06/2016 0024   GLUCOSE 104* 02/06/2016 0024   GLUCOSE 85 09/21/2015 0916   BUN 11 02/06/2016 0024   BUN 12 09/21/2015 0916   CREATININE 0.96 02/06/2016 0024   CALCIUM 8.7* 02/06/2016 0024   GFRNONAA >60 02/06/2016 0024   GFRAA >60 02/06/2016 0024    Assessment/Planning: 1. Ischemic right leg: The patient's foot is stable and not in any imminent threat. I will continue the heparin. I will plan for angiography on Tuesday GI evaluation is completed.  Cardiology has cleared her.  The risks and benefits for angiography and intervention possible thrombolysis possible central line placement were  reviewed in detail wit the patient.  Length of discussion was 30 minutes. 2. Chest pain: Echo is done cardiology evaluation is complete. She is clear 3. Peptic ulcer disease and erosive reflux disease: GI will be asked to evaluate and likely she will have an EGD tomorrow. PPI will be continued 4. Hypertension: Her home medications will be continued for now it will be adjusted to keep her in a normotensive range particularly if thrombolytic therapy is required. 5. Reactive airway disease: Medicine has been asked to evaluate the patient and assist with treatment of her pulmonary condition as well as her other medical comorbidities. 6. Hyperlipidemia: cont. Atorvastatin 7. Depression: cont. Effexor, Wellbutrin  [name]  02/07/2016, 8:48 PM

## 2016-02-07 NOTE — Progress Notes (Signed)
[facility] [place] NAME: Sara [name]    MR#:  [id number]  DATE OF BIRTH:  08/30/1950  SUBJECTIVE:  CHIEF COMPLAINT:   Chief Complaint  Patient presents with  . Leg Pain   Right leg and foot pain. Cough. S/p stress test. REVIEW OF SYSTEMS:  CONSTITUTIONAL: No fever, fatigue or weakness.  EYES: No blurred or double vision.  EARS, NOSE, AND THROAT: No tinnitus or ear pain.  RESPIRATORY: has cough, no shortness of breath, wheezing or hemoptysis.  CARDIOVASCULAR: No chest pain, orthopnea, edema.  GASTROINTESTINAL: No nausea, vomiting, diarrhea or abdominal pain.  GENITOURINARY: No dysuria, hematuria.  ENDOCRINE: No polyuria, nocturia,  HEMATOLOGY: No anemia, easy bruising or bleeding SKIN: No rash or lesion. MUSCULOSKELETAL: Right leg and foot pain.   NEUROLOGIC: No tingling, numbness, weakness.  PSYCHIATRY: No anxiety or depression.   DRUG ALLERGIES:   Allergies  Allergen Reactions  . Amoxicillin Other (See Comments)    Yeast infection  . Vicodin [Hydrocodone-Acetaminophen] Hives and Rash    VITALS:  Blood pressure 115/72, pulse 85, temperature 98.2 F (36.8 C), temperature source Oral, resp. rate 19, height 5\' 4"  (1.626 m), weight 146 lb 11.2 oz (66.543 kg), SpO2 95 %.  PHYSICAL EXAMINATION:  GENERAL:  65 y.o.-year-old patient lying in the bed with no acute distress.  EYES: Pupils equal, round, reactive to light and accommodation. No scleral icterus. Extraocular muscles intact.  HEENT: Head atraumatic, normocephalic. Oropharynx and nasopharynx clear.  NECK:  Supple, no jugular venous distention. No thyroid enlargement, no tenderness.  LUNGS: Normal breath sounds bilaterally, no wheezing, rales,rhonchi or crepitation. No use of accessory muscles of respiration.  CARDIOVASCULAR: S1, S2 normal. No murmurs, rubs, or gallops.  ABDOMEN: Soft, nontender, nondistended. Bowel sounds present. No organomegaly or mass.   EXTREMITIES: No pedal edema, cyanosis, or clubbing. dusky-appearing right lower extremity with poor palpable pulses.  NEUROLOGIC: Cranial nerves II through XII are intact. Muscle strength 5/5 in all extremities. Sensation intact. Gait not checked.  PSYCHIATRIC: The patient is alert and oriented x 3.  SKIN: No obvious rash, lesion, or ulcer.    LABORATORY PANEL:   CBC  Recent Labs Lab 02/07/16 0330  WBC 12.6*  HGB 13.9  HCT 41.4  PLT 190   ------------------------------------------------------------------------------------------------------------------  Chemistries   Recent Labs Lab 02/05/16 1221 02/06/16 0024  NA 138 136  K 3.6 3.9  CL 103 105  CO2 25 23  GLUCOSE 106* 104*  BUN 12 11  CREATININE 1.16* 0.96  CALCIUM 8.9 8.7*  AST 28  --   ALT 32  --   ALKPHOS 52  --   BILITOT 0.1*  --    ------------------------------------------------------------------------------------------------------------------  Cardiac Enzymes  Recent Labs Lab 02/07/16 0730  TROPONINI <0.03   ------------------------------------------------------------------------------------------------------------------  RADIOLOGY:  Nm Myocar Multi W/spect W/wall Motion / Ef  02/07/2016   No T wave inversion was noted during stress.  There was no ST segment deviation noted during stress.  The study is normal.  This is a low risk study.  The left ventricular ejection fraction is normal (55-65%).     EKG:   Orders placed or performed in visit on 01/27/16  . EKG 12-Lead    ASSESSMENT AND PLAN:   65 year old female with past medical history of peripheral vascular disease, osteoporosis, peptic ulcer disease, depression, hyperlipidemia, who presents to the hospital due to right lower extremity pain and cyanotic appearing right lower extremity is noted to have acute PVD with  acute superficial femoral artery and popliteal artery occlusions.  1. Acute peripheral vascular disease-patient was noted  to have acute superficial femoral artery on the right and also popliteal artery occlusions. 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 Continue heparin drip, pain control and follow-up Dr. Delana Meyer for possible angiography on Tuesday.  2. Chest pain-patient has had intermittent chest pain for the past week or so. No chest pain today. Normal stress test and echo.   3. Hx of GERD/PUD - cont. Protonix bid. Hemoglobin is stable. - watch for bleeding on heparin gtt. F/u GI for possible EGD today.  4. Hyperlipidemia - cont. Atorvastatin  5. Depression - cont. Effexor, Wellbutrin  6. Osteoporosis - cont. Denosumab.   URI. Tessalon and Robitussin when necessary.  He discussed with Dr. Rockey Situ. All the records are reviewed and case discussed with Care Management/Social Workerr. Management plans discussed with the patient, family and they are in agreement.  CODE STATUS: Full code  TOTAL TIME TAKING CARE OF THIS PATIENT: 35 minutes.  Greater than 50% time was spent on coordination of care and face-to-face counseling.  POSSIBLE D/C IN ? DAYS, DEPENDING ON CLINICAL CONDITION.   Demetrios Loll M.D on 02/07/2016 at 1:59 PM  Between 7am to 6pm - Pager - [phone]  After 6pm go to [email or web address] - password [email or web address] [facility]  [facility]  Office  [phone]  CC: Primary care physician; Amy Annamary Rummage, NP

## 2016-02-07 NOTE — Progress Notes (Signed)
Initial Nutrition Assessment  DOCUMENTATION CODES:   Not applicable  INTERVENTION:  -Monitor intake and diet progression   NUTRITION DIAGNOSIS:   Inadequate oral intake related to poor appetite, altered GI function as evidenced by per patient/family report.    GOAL:   Patient will meet greater than or equal to 90% of their needs    MONITOR:   PO intake, Diet advancement  REASON FOR ASSESSMENT:   Malnutrition Screening Tool    ASSESSMENT:      Pt admitted with PAD/ischemic right leg. Vascular surgery following.  Pt for stress test secondary to chest pain and EGD secondary to PUD and reflux  Past Medical History  Diagnosis Date  . GERD (gastroesophageal reflux disease)   . Osteoporosis   . Vascular disease     Sees Dr. Delana Meyer  . Vertigo     Last episode approx Aug 2015  . Depression   . History of blood clots   . Stomach ulcer    Pt reports for the past few days prior to admission eating less that normal intake.  Reports typically eats mostly vegetables.  Noted per I and O sheet intake 100% of dinner last night and breakfast yesterday am  Medications reviewed:  Labs reviewed: glucose 104  Nutrition-Focused physical exam completed. Findings are WDL for fat depletion, muscle depletion, and edema.    Diet Order:  Diet - low sodium heart healthy Diet NPO time specified  Skin:  Reviewed, no issues  Last BM:  6/18  Height:   Ht Readings from Last 1 Encounters:  02/05/16 5\' 4"  (1.626 m)    Weight: pt reports wt loss of about 5 pounds in the last 6 weeks but not reflective in wt encounters  Wt Readings from Last 1 Encounters:  02/07/16 146 lb 11.2 oz (66.543 kg)  Wt Readings from Last 10 Encounters:  02/07/16  146 lb 11.2 oz (66.543 kg)  01/31/16  146 lb (66.225 kg)  01/27/16  145 lb (65.772 kg)  01/04/16  145 lb (65.772 kg)  01/02/16  140 lb (63.504 kg)  12/28/15  148 lb (67.132 kg)  12/06/15  145 lb (65.772 kg)  10/11/15  142 lb  (64.411 kg)  09/06/15  139 lb 3.2 oz (63.141 kg)  07/14/15  139 lb 4.8 oz (63.186 kg)   Ideal Body Weight:     BMI:  Body mass index is 25.17 kg/(m^2).  Estimated Nutritional Needs:   Kcal:  1650-1980 kcals/d  Protein:  79-99 g/d  Fluid:  1.6-1.9 L/d  EDUCATION NEEDS:   No education needs identified at this time  Miko Markwood B. Zenia Resides, [place], [place] (pager) Weekend/On-Call pager [phone])

## 2016-02-07 NOTE — Consult Note (Signed)
Lucilla Lame, MD [address] [place], [place] [address] Phone: [phone] Fax : [phone]  Consultation  Referring Provider:     No ref. provider found Primary Care Physician:  Leata Mouse, NP Primary Gastroenterologist:  Dr. Allen Norris         Reason for Consultation:     History of gastritis  Date of Admission:  02/05/2016 Date of Consultation:  02/07/2016         HPI:   Sara [name] is a 65 y.o. female who was admitted with right leg vascular compromise. The patient has a history of gastritis. The patient may need anticoagulation and thrombolytics. I'm now being asked to see the patient for repeat upper endoscopy to document resolution of her gastritis.  Past Medical History  Diagnosis Date  . GERD (gastroesophageal reflux disease)   . Osteoporosis   . Vascular disease     Sees Dr. Delana Meyer  . Vertigo     Last episode approx Aug 2015  . Depression   . History of blood clots   . Stomach ulcer     Past Surgical History  Procedure Laterality Date  . Vascular surgery  [id number]    Fem-Pop Bypass  . Hemorroidectomy  2014  . Esophagogastroduodenoscopy N/A 12/21/2014    Procedure: ESOPHAGOGASTRODUODENOSCOPY (EGD);  Surgeon: Lucilla Lame, MD;  Location: [facility];  Service: Gastroenterology;  Laterality: N/A;  . Tonsilectomy, adenoidectomy, bilateral myringotomy and tubes  1968    Prior to Admission medications   Medication Sig Start Date End Date Taking? Authorizing Provider  albuterol (PROVENTIL HFA;VENTOLIN HFA) 108 (90 Base) MCG/ACT inhaler Inhale 2 puffs into the lungs every 6 (six) hours as needed for wheezing or shortness of breath. 01/31/16  Yes Amy Overton Mam, NP  atorvastatin (LIPITOR) 10 MG tablet Take 1 tablet (10 mg total) by mouth daily. 01/04/16  Yes Amy Overton Mam, NP  benzonatate (TESSALON) 100 MG capsule Take 1 capsule (100 mg total) by mouth 3 (three) times daily as needed. 01/31/16  Yes Amy Overton Mam, NP  buPROPion (WELLBUTRIN XL)  150 MG 24 hr tablet Take 1 tablet (150 mg total) by mouth daily. 09/06/15  Yes Amy Overton Mam, NP  Calcium Carbonate-Vitamin D (CALCIUM-VITAMIN D) 500-200 MG-UNIT tablet Take 3 tablets by mouth daily.   Yes Historical Provider, MD  clobetasol cream (TEMOVATE) [id number] % Apply 1 application topically 2 (two) times daily. 12/06/15  Yes Amy Overton Mam, NP  denosumab (PROLIA) 60 MG/ML SOLN injection Inject 60 mg into the skin every 6 (six) months. Administer in upper arm, thigh, or abdomen 01/31/16  Yes Amy Overton Mam, NP  dextromethorphan-guaiFENesin (MUCINEX DM) 30-600 MG 12hr tablet Take 1 tablet by mouth 2 (two) times daily. 01/31/16  Yes Amy Overton Mam, NP  Melatonin 1 MG TABS Take 1 tablet (1 mg total) by mouth Nightly. 12/28/15  Yes Amy Overton Mam, NP  Multiple Vitamin (MULTIVITAMIN) capsule Take 1 capsule by mouth daily. PM   Yes Historical Provider, MD  nitroGLYCERIN (NITROSTAT) 0.4 MG SL tablet Place 1 tablet (0.4 mg total) under the tongue every 5 (five) minutes as needed for chest pain. 01/27/16  Yes Wende Bushy, MD  pantoprazole (PROTONIX) 40 MG tablet Take 1 tablet (40 mg total) by mouth 2 (two) times daily. 09/06/15  Yes Amy Overton Mam, NP  predniSONE (DELTASONE) 20 MG tablet Take 2 tablets (40 mg total) by mouth daily with breakfast. 01/31/16  Yes Amy Overton Mam, NP  triamcinolone cream (KENALOG) 0.1 %  Apply 1 application topically 2 (two) times daily. 10/11/15  Yes Amy Overton Mam, NP  venlafaxine XR (EFFEXOR-XR) 75 MG 24 hr capsule Take 1 capsule (75 mg total) by mouth daily with breakfast. 09/06/15  Yes Amy Overton Mam, NP    Family History  Problem Relation Age of Onset  . Cancer Father     colon cancer  . Heart disease Maternal Uncle   . CVA Mother   . Heart attack Mother   . Colon cancer Father      Social History  Substance Use Topics  . Smoking status: Former Smoker -- 2.00 packs/day for 25 years    Types: Cigarettes    Quit date: 08/22/1991  . Smokeless tobacco:  Never Used  . Alcohol Use: No    Allergies as of 02/05/2016 - Review Complete 02/05/2016  Allergen Reaction Noted  . Amoxicillin Other (See Comments) 03/09/2015  . Vicodin [hydrocodone-acetaminophen] Hives and Rash 12/18/2014    Review of Systems:    All systems reviewed and negative except where noted in HPI.   Physical Exam:  Vital signs in last 24 hours: Temp:  [98.1 F (36.7 C)-98.3 F (36.8 C)] 98.2 F (36.8 C) (06/19 1300) Pulse Rate:  [80-104] 85 (06/19 1300) Resp:  [19-20] 19 (06/19 1300) BP: (110-117)/(69-76) 115/72 mmHg (06/19 1300) SpO2:  [94 %-95 %] 95 % (06/19 1300) Weight:  [146 lb 11.2 oz (66.543 kg)] 146 lb 11.2 oz (66.543 kg) (06/19 0641) Last BM Date: 02/06/16 General:   Pleasant, cooperative in NAD Head:  Normocephalic and atraumatic. Eyes:   No icterus.   Conjunctiva pink. PERRLA. Neurologic:  Alert and oriented x3;  grossly normal neurologically. Skin:  Intact without significant lesions or rashes. Cervical Nodes:  No significant cervical adenopathy. Psych:  Alert and cooperative. Normal affect.  LAB RESULTS:  Recent Labs  02/06/16 0024 02/06/16 0851 02/07/16 0330  WBC 10.9 10.7 12.6*  HGB 14.0 14.2 13.9  HCT 41.9 42.8 41.4  PLT 185 190 190   BMET  Recent Labs  02/05/16 1221 02/06/16 0024  NA 138 136  K 3.6 3.9  CL 103 105  CO2 25 23  GLUCOSE 106* 104*  BUN 12 11  CREATININE 1.16* 0.96  CALCIUM 8.9 8.7*   LFT  Recent Labs  02/05/16 1221  PROT 7.2  ALBUMIN 4.2  AST 28  ALT 32  ALKPHOS 52  BILITOT 0.1*  BILIDIR <0.1*  IBILI NOT CALCULATED   PT/INR  Recent Labs  02/05/16 1221  LABPROT 12.7  INR 0.93    STUDIES: Nm Myocar Multi W/spect W/wall Motion / Ef  02/07/2016   No T wave inversion was noted during stress.  There was no ST segment deviation noted during stress.  The study is normal.  This is a low risk study.  The left ventricular ejection fraction is normal (55-65%).       Impression / Plan:    Sara [name] is a 65 y.o. y/o female with a history of gastritis. The patient may need anticoagulation versus trauma lysis of a vascular compromise right leg. The patient will have a upper endoscopy tomorrow to document healing of her gastritis.I have discussed risks & benefits which include, but are not limited to, bleeding, infection, perforation & drug reaction.  The patient agrees with this plan & written consent will be obtained.     Thank you for involving me in the care of this patient.      LOS: 2 days   Lucilla Lame,  MD  02/07/2016, 6:19 PM   Note: This dictation was prepared with Dragon dictation along with smaller phrase technology. Any transcriptional errors that result from this process are unintentional.

## 2016-02-07 NOTE — Progress Notes (Signed)
Per Dr. Delana Meyer, okay to start pt on regular diet.

## 2016-02-07 NOTE — Progress Notes (Signed)
Patient: Sara [name] / Admit Date: 02/05/2016 / Date of Encounter: 02/07/2016, 7:37 AM   Subjective: She is for nuclear stress test this morning. Troponin pending at this time. Had 5 minutes of upper left chest wall pain overnight. She did not notify any staff. Currently chest pain free.   Review of Systems: Review of Systems  Constitutional: Positive for malaise/fatigue. Negative for fever, chills, weight loss and diaphoresis.  HENT: Negative for congestion.   Eyes: Negative for discharge and redness.  Respiratory: Negative for cough, hemoptysis, sputum production, shortness of breath and wheezing.   Cardiovascular: Positive for chest pain. Negative for palpitations, orthopnea, claudication, leg swelling and PND.  Gastrointestinal: Negative for nausea, vomiting and abdominal pain.  Musculoskeletal: Positive for myalgias. Negative for falls.  Skin: Negative for rash.  Neurological: Negative for dizziness, sensory change, speech change, focal weakness, loss of consciousness and weakness.  Endo/Heme/Allergies: Does not bruise/bleed easily.  Psychiatric/Behavioral: The patient is not nervous/anxious.     Objective: Telemetry: NSR 80's to sinus tachycardia 150's bpm Physical Exam: Blood pressure 110/69, pulse 80, temperature 98.3 F (36.8 C), temperature source Oral, resp. rate 20, height 5\' 4"  (1.626 m), weight 146 lb 11.2 oz (66.543 kg), SpO2 94 %. Body mass index is 25.17 kg/(m^2). General: Well developed, well nourished, in no acute distress. Head: Normocephalic, atraumatic, sclera non-icteric, no xanthomas, nares are without discharge. Neck: Negative for carotid bruits. JVP not elevated. Lungs: Clear bilaterally to auscultation without wheezes, rales, or rhonchi. Breathing is unlabored. Heart: RRR S1 S2 without murmurs, rubs, or gallops.  Abdomen: Soft, non-tender, non-distended with normoactive bowel sounds. No rebound/guarding. Extremities: No clubbing or cyanosis. No edema.    Neuro: Alert and oriented X 3. Moves all extremities spontaneously. Psych:  Responds to questions appropriately with a normal affect.   Intake/Output Summary (Last 24 hours) at 02/07/16 0737 Last data filed at 02/06/16 1700  Gross per 24 hour  Intake 1242.75 ml  Output   1725 ml  Net -482.25 ml    Inpatient Medications:  . atorvastatin  10 mg Oral Daily  . buPROPion  150 mg Oral Daily  . calcium-vitamin D  3 tablet Oral Daily  . dextromethorphan  30 mg Oral BID   And  . guaiFENesin  600 mg Oral BID  . docusate sodium  100 mg Oral BID  . pantoprazole  40 mg Oral BID  . predniSONE  40 mg Oral Q breakfast  . sodium chloride flush  3 mL Intravenous Q12H  . venlafaxine XR  75 mg Oral Q breakfast   Infusions:  . heparin 850 Units/hr (02/06/16 1624)    Labs:  Recent Labs  02/05/16 1221 02/06/16 0024  NA 138 136  K 3.6 3.9  CL 103 105  CO2 25 23  GLUCOSE 106* 104*  BUN 12 11  CREATININE 1.16* 0.96  CALCIUM 8.9 8.7*    Recent Labs  02/05/16 1221  AST 28  ALT 32  ALKPHOS 52  BILITOT 0.1*  PROT 7.2  ALBUMIN 4.2    Recent Labs  02/06/16 0851 02/07/16 0330  WBC 10.7 12.6*  HGB 14.2 13.9  HCT 42.8 41.4  MCV 94.2 94.8  PLT 190 190   No results for input(s): CKTOTAL, CKMB, TROPONINI in the last 72 hours. Invalid input(s): POCBNP No results for input(s): HGBA1C in the last 72 hours.   Weights: Filed Weights   02/05/16 1833 02/05/16 2056 02/07/16 0641  Weight: 146 lb 6.4 oz (66.407 kg) 150 lb 9.6  oz (68.312 kg) 146 lb 11.2 oz (66.543 kg)     Radiology/Studies:  Ct Angio Ao+bifem W/cm &/or Wo/cm  02/05/2016  IMPRESSION: No significant obstruction of the aorta or iliac arterial system The right superficial femoral artery and popliteal artery are occluded. The right femoral to distal bypass graft is occluded. The major branch of the right profunda femoral artery is also occluded. Small profunda femoral an muscular branches eventually reconstitute the  anterior and posterior tibial arteries. Left lower extremity arterial system is patent with 3 vessel runoff to the distal leg The origins of the IMA and SMA are occluded. These vessels reconstitute via collaterals from the celiac artery which is patent. Tiny hypodensity in the liver is nonspecific. If there is a history of malignancy or a there is high risk for malignancy, six-month followup MRI is recommended. L1 compression fracture as described. There are varicosities in the right calf. Venous insufficiency is not excluded. Electronically Signed   By: Marybelle Killings M.D.   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 Pre-procedure evaluation: -She is for nuclear stress test this morning given recent history of chest pain in May 2017 -No chest pain currently -Echo from 02/06/16 showed normal LVSF with an EF of 60-65%, normal wall motion, GR1DD, mild MR, RVSF normal, PASP normal -Await results for troponin this morning as well as results from stress testing  2. Chest pain: -Atypical features  -Nuclear stress test as above -Had 5 minutes of chest pain overnight, currently pain free  3. PAD/ischemic right leg: -Scheduled for angiographic procedure on 02/08/16 -Review of recent CT of inferior heart to legs showed mild atherosclerosis  -Stress testing as above  4. Occluded IMA/SMA: -Uncertain etiology -Has normal LVSF, less likely to be thrombus -Must exclude arrhythmia such as Afib/flutter. Monitor on telemetry. Would benefit from outpatient cardiac monioring given history of arterial occlusion  5. HLD: -Lipitor  5. GERD: -Esophageal ulcers on EGD in 12/2014 with oozing blood -Scheduled for repeat EGD this admission with GI   Signed, Christell Faith,  PA-C [place] Pager: [phone] 02/07/2016, 7:37 AM

## 2016-02-07 NOTE — Care Management Important Message (Signed)
Important Message  Patient Details  Name: Sara [name] MRN: [id number] Date of Birth: 1950/10/26   Medicare Important Message Given:  Yes    Katrina Stack, RN 02/07/2016, 10:04 AM

## 2016-02-08 ENCOUNTER — Inpatient Hospital Stay: Payer: Medicare Other | Admitting: Anesthesiology

## 2016-02-08 ENCOUNTER — Encounter: Payer: Self-pay | Admitting: *Deleted

## 2016-02-08 ENCOUNTER — Other Ambulatory Visit: Payer: Medicare Other

## 2016-02-08 ENCOUNTER — Encounter
Admission: EM | Disposition: A | Discharge status: Home / Self Care | Payer: Self-pay | Source: Home / Self Care | Attending: Vascular Surgery

## 2016-02-08 DIAGNOSIS — K2970 Gastritis, unspecified, without bleeding: Secondary | ICD-10-CM | POA: Insufficient documentation

## 2016-02-08 DIAGNOSIS — K228 Other specified diseases of esophagus: Secondary | ICD-10-CM

## 2016-02-08 DIAGNOSIS — K449 Diaphragmatic hernia without obstruction or gangrene: Secondary | ICD-10-CM | POA: Insufficient documentation

## 2016-02-08 DIAGNOSIS — K2289 Other specified disease of esophagus: Secondary | ICD-10-CM | POA: Insufficient documentation

## 2016-02-08 HISTORY — PX: ESOPHAGOGASTRODUODENOSCOPY (EGD) WITH PROPOFOL: SHX5813

## 2016-02-08 LAB — CBC
HEMATOCRIT: 41.8 % (ref 35.0–47.0)
HEMOGLOBIN: 14.1 g/dL (ref 12.0–16.0)
MCH: 32.3 pg (ref 26.0–34.0)
MCHC: 33.7 g/dL (ref 32.0–36.0)
MCV: 96 fL (ref 80.0–100.0)
Platelets: 184 10*3/uL (ref 150–440)
RBC: 4.36 MIL/uL (ref 3.80–5.20)
RDW: 13.5 % (ref 11.5–14.5)
WBC: 10.4 10*3/uL (ref 3.6–11.0)

## 2016-02-08 LAB — BASIC METABOLIC PANEL
ANION GAP: 7 (ref 5–15)
BUN: 12 mg/dL (ref 6–20)
CALCIUM: 8.7 mg/dL — AB (ref 8.9–10.3)
CHLORIDE: 105 mmol/L (ref 101–111)
CO2: 26 mmol/L (ref 22–32)
Creatinine, Ser: 0.97 mg/dL (ref 0.44–1.00)
GFR calc non Af Amer: >60 mL/min (ref >60)
GLUCOSE: 107 mg/dL — AB (ref 65–99)
POTASSIUM: 4.5 mmol/L (ref 3.5–5.1)
Sodium: 138 mmol/L (ref 135–145)

## 2016-02-08 LAB — HEPARIN LEVEL (UNFRACTIONATED): HEPARIN UNFRACTIONATED: 0.58 [IU]/mL (ref 0.30–0.70)

## 2016-02-08 LAB — MAGNESIUM: Magnesium: 2.1 mg/dL (ref 1.7–2.4)

## 2016-02-08 SURGERY — ESOPHAGOGASTRODUODENOSCOPY (EGD) WITH PROPOFOL
Anesthesia: General

## 2016-02-08 MED ORDER — MIDAZOLAM HCL 2 MG/2ML IJ SOLN
INTRAMUSCULAR | Status: DC | PRN
  Administered 2016-02-08: 2 mg via INTRAVENOUS

## 2016-02-08 MED ORDER — LIDOCAINE 2% (20 MG/ML) 5 ML SYRINGE
INTRAMUSCULAR | Status: DC | PRN
  Administered 2016-02-08: 60 mg via INTRAVENOUS

## 2016-02-08 MED ORDER — SODIUM CHLORIDE 0.9 % IV SOLN
INTRAVENOUS | Status: DC
  Administered 2016-02-08 – 2016-02-11 (×5): via INTRAVENOUS

## 2016-02-08 MED ORDER — SODIUM CHLORIDE 0.9 % IV SOLN
INTRAVENOUS | Status: DC
  Administered 2016-02-08: 1000 mL via INTRAVENOUS
  Administered 2016-02-08: 16:00:00 via INTRAVENOUS

## 2016-02-08 MED ORDER — PROPOFOL 10 MG/ML IV BOLUS
INTRAVENOUS | Status: DC | PRN
  Administered 2016-02-08: 20 mg via INTRAVENOUS
  Administered 2016-02-08: 40 mg via INTRAVENOUS

## 2016-02-08 NOTE — Transfer of Care (Signed)
Immediate Anesthesia Transfer of Care Note  Patient: Sara [name]  Procedure(s) Performed: Procedure(s): ESOPHAGOGASTRODUODENOSCOPY (EGD) WITH PROPOFOL (N/A)  Patient Location: Endoscopy Unit  Anesthesia Type:General  Level of Consciousness: awake, alert , oriented and patient cooperative  Airway & Oxygen Therapy: Patient Spontanous Breathing and Patient connected to nasal cannula oxygen  Post-op Assessment: Report given to RN, Post -op Vital signs reviewed and stable and Patient moving all extremities X 4  Post vital signs: Reviewed and stable  Last Vitals:  Filed Vitals:   02/08/16 1241 02/08/16 1527  BP: 110/65 114/72  Pulse: 90 93  Temp: 37 C 36.8 C  Resp: 17     Last Pain:  Filed Vitals:   02/08/16 1531  PainSc: 5       Patients Stated Pain Goal: 0 ([id number])  Complications: No apparent anesthesia complications

## 2016-02-08 NOTE — Op Note (Addendum)
[facility] Gastroenterology Patient Name: Sara [name] Procedure Date: 02/08/2016 4:01 PM MRN: [id number] Account #: [id number] Date of Birth: 04-12-1951 Admit Type: Inpatient Age: 65 Room: [facility] ENDO ROOM 3 Gender: Female Note Status: Supervisor Override Procedure:            Upper GI endoscopy Indications:          Follow-up of gastritis Providers:            Lucilla Lame, MD Medicines:            Propofol per Anesthesia Complications:        No immediate complications. Procedure:            Pre-Anesthesia Assessment:                       - Prior to the procedure, a History and Physical was                        performed, and patient medications and allergies were                        reviewed. The patient's tolerance of previous                        anesthesia was also reviewed. The risks and benefits of                        the procedure and the sedation options and risks were                        discussed with the patient. All questions were                        answered, and informed consent was obtained. Prior                        Anticoagulants: The patient has taken heparin. ASA                        Grade Assessment: II - A patient with mild systemic                        disease. After reviewing the risks and benefits, the                        patient was deemed in satisfactory condition to undergo                        the procedure.                       After obtaining informed consent, the endoscope was                        passed under direct vision. Throughout the procedure,                        the patient's blood pressure, pulse, and oxygen                        saturations were  monitored continuously. The Endoscope                        was introduced through the mouth, and advanced to the                        second part of duodenum. The upper GI endoscopy was                        accomplished without difficulty.  The patient tolerated                        the procedure well. Findings:      The Z-line was irregular and was found in the lower third of the       esophagus.      A small hiatal hernia was present.      The stomach was normal.      The examined duodenum was normal. Impression:           - Z-line irregular, in the lower third of the esophagus.                       - Small hiatal hernia.                       - Normal stomach.                       - Normal examined duodenum.                       - No specimens collected. Recommendation:       - Return patient to hospital ward for ongoing care. Procedure Code(s):    --- Professional ---                       [phone], Esophagogastroduodenoscopy, flexible, transoral;                        diagnostic, including collection of specimen(s) by                        brushing or washing, when performed (separate procedure) Diagnosis Code(s):    --- Professional ---                       K29.70, Gastritis, unspecified, without bleeding                       K22.8, Other specified diseases of esophagus                       K44.9, Diaphragmatic hernia without obstruction or                        gangrene CPT copyright 2016 American [employer] Association. All rights reserved. The codes documented in this report are preliminary and upon coder review may  be revised to meet current compliance requirements. Lucilla Lame, MD 02/08/2016 4:10:37 PM This report has been signed electronically. Number of Addenda: 0 Note Initiated On: 02/08/2016 4:01 PM      [facility]

## 2016-02-08 NOTE — Anesthesia Preprocedure Evaluation (Signed)
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:   Pulmonary neg shortness of breath, neg sleep apnea, neg COPD, neg recent URI, former smoker,    Pulmonary exam normal        Cardiovascular (-) hypertension(-) angina+ Peripheral Vascular Disease  (-) CAD, (-) Past MI, (-) Cardiac Stents and (-) CABG (-) dysrhythmias (-) Valvular Problems/Murmurs     Neuro/Psych PSYCHIATRIC DISORDERS (Depression) negative neurological ROS     GI/Hepatic Neg liver ROS, PUD, GERD  ,  Endo/Other  negative endocrine ROS  Renal/GU negative Renal ROS     Musculoskeletal   Abdominal Normal abdominal exam  (+)   Peds  Hematology negative hematology ROS (+)   Anesthesia Other Findings Past Medical History:   GERD (gastroesophageal reflux disease)                       Osteoporosis                                                 Vascular disease                                               Comment:Sees Dr. Delana Meyer   Vertigo                                                        Comment:Last episode approx Aug 2015   Depression                                                   History of blood clots                                       Stomach ulcer                                                Reproductive/Obstetrics negative OB ROS                             Anesthesia Physical  Anesthesia Plan  ASA: II  Anesthesia Plan: General   Post-op Pain Management:    Induction:   Airway Management Planned:   Additional Equipment:   Intra-op Plan:   Post-operative Plan:   Informed Consent: I have reviewed the patients History and Physical, chart, labs and discussed the  procedure including the risks, benefits and alternatives for the proposed anesthesia  with the patient or authorized representative who has indicated his/her understanding and acceptance.     Plan Discussed with: Anesthesiologist and CRNA  Anesthesia Plan Comments:         Anesthesia Quick Evaluation

## 2016-02-08 NOTE — Op Note (Addendum)
[facility] Gastroenterology Patient Name: Sara [name] Procedure Date: 02/08/2016 4:01 PM MRN: [id number] Account #: [id number] Date of Birth: 09/09/1950 Admit Type: Inpatient Age: 65 Room: [facility] ENDO ROOM 3 Gender: Female Note Status: Supervisor Override Procedure:            Upper GI endoscopy Indications:          Follow-up of gastritis Providers:            Lucilla Lame, MD Medicines:            Propofol per Anesthesia Complications:        No immediate complications. Procedure:            Pre-Anesthesia Assessment:                       - Prior to the procedure, a History and Physical was                        performed, and patient medications and allergies were                        reviewed. The patient's tolerance of previous                        anesthesia was also reviewed. The risks and benefits of                        the procedure and the sedation options and risks were                        discussed with the patient. All questions were                        answered, and informed consent was obtained. Prior                        Anticoagulants: The patient has taken heparin. ASA                        Grade Assessment: II - A patient with mild systemic                        disease. After reviewing the risks and benefits, the                        patient was deemed in satisfactory condition to undergo                        the procedure.                       After obtaining informed consent, the endoscope was                        passed under direct vision. Throughout the procedure,                        the patient's blood pressure, pulse, and oxygen                        saturations were  monitored continuously. The Endoscope                        was introduced through the mouth, and advanced to the                        second part of duodenum. The upper GI endoscopy was                        accomplished without difficulty.  The patient tolerated                        the procedure well. Findings:      The Z-line was irregular and was found in the lower third of the       esophagus.      A small hiatal hernia was present.      The stomach was normal.      The examined duodenum was normal. Impression:           - Z-line irregular, in the lower third of the esophagus.                       - Small hiatal hernia.                       - Normal stomach.                       - Normal examined duodenum.                       - No specimens collected. Recommendation:       - Return patient to hospital ward for ongoing care. Procedure Code(s):    --- Professional ---                       [phone], Esophagogastroduodenoscopy, flexible, transoral;                        diagnostic, including collection of specimen(s) by                        brushing or washing, when performed (separate procedure) Diagnosis Code(s):    --- Professional ---                       K29.70, Gastritis, unspecified, without bleeding                       K22.8, Other specified diseases of esophagus                       K44.9, Diaphragmatic hernia without obstruction or                        gangrene CPT copyright 2016 American Medical Association. All rights reserved. The codes documented in this report are preliminary and upon coder review may  be revised to meet current compliance requirements. Lucilla Lame, MD 02/08/2016 4:10:37 PM This report has been signed electronically. Number of Addenda: 0 Note Initiated On: 02/08/2016 4:01 PM      [facility]

## 2016-02-08 NOTE — Progress Notes (Signed)
Per Dr. Delana Meyer, Vascular procedure will not be done until tomorrow.

## 2016-02-08 NOTE — Op Note (Addendum)
THIS EXAM WAS SENT IN ERROR

## 2016-02-08 NOTE — Consult Note (Signed)
ANTICOAGULATION CONSULT NOTE - Initial Consult  Pharmacy Consult for heparin drip Indication: thrombosis of right leg bypass  Allergies  Allergen Reactions  . Amoxicillin Other (See Comments)    Yeast infection  . Vicodin [Hydrocodone-Acetaminophen] Hives and Rash    Patient Measurements: Height: 5\' 4"  (162.6 cm) Weight: 148 lb 8 oz (67.359 kg) IBW/kg (Calculated) : 54.7 Heparin Dosing Weight: 63.5kg  Vital Signs: Temp: 98.1 F (36.7 C) (06/20 0535) Temp Source: Oral (06/20 0535) BP: 106/83 mmHg (06/20 0535) Pulse Rate: 101 (06/20 0535)  Labs:  Recent Labs  02/05/16 1221 02/05/16 1832  02/06/16 0024 02/06/16 0851  02/06/16 2312 02/07/16 0330 02/07/16 0730 02/08/16 0720  HGB 14.7  --   --  14.0 14.2  --   --  13.9  --  14.1  HCT 43.5  --   --  41.9 42.8  --   --  41.4  --  41.8  PLT 200  --   --  185 190  --   --  190  --  184  APTT  --  >160*  --   --   --   --   --   --   --   --   LABPROT 12.7  --   --   --   --   --   --   --   --   --   INR 0.93  --   --   --   --   --   --   --   --   --   HEPARINUNFRC  --   --   < > 1.22* 0.72*  < > 0.61 0.54  --  0.58  CREATININE 1.16*  --   --  0.96  --   --   --   --   --  0.97  TROPONINI  --   --   --   --   --   --   --   --  <0.03  --   < > = values in this interval not displayed.  Estimated Creatinine Clearance: 56 mL/min (by C-G formula based on Cr of 0.97).   Medical History: Past Medical History  Diagnosis Date  . GERD (gastroesophageal reflux disease)   . Osteoporosis   . Vascular disease     Sees Dr. Delana Meyer  . Vertigo     Last episode approx Aug 2015  . Depression   . History of blood clots   . Stomach ulcer     Medications:  Scheduled:  . atorvastatin  10 mg Oral Daily  . buPROPion  150 mg Oral Daily  . calcium-vitamin D  3 tablet Oral Daily  . clindamycin (CLEOCIN) IV  300 mg Intravenous On Call to OR  . dextromethorphan  30 mg Oral BID   And  . guaiFENesin  600 mg Oral BID  . docusate  sodium  100 mg Oral BID  . pantoprazole  40 mg Oral BID  . predniSONE  40 mg Oral Q breakfast  . sodium chloride flush  3 mL Intravenous Q12H  . 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 Pharmacy consulted to dose heparin drip. Per Dr. Delana Meyer pt has no CI to full anticoagulation and ok to use nomogram  Goal of Therapy:  Heparin level 0.3-0.7 units/ml Monitor platelets by  anticoagulation protocol: Yes   Plan:  Will continue current orders for heparin 850 units/hr, heparin level is at goal.   Recheck CBC and HL with AM labs   Thomson Herbers C 02/08/2016,9:55 AM

## 2016-02-08 NOTE — Anesthesia Postprocedure Evaluation (Signed)
Anesthesia Post Note  Patient: Sara [name]  Procedure(s) Performed: Procedure(s) (LRB): ESOPHAGOGASTRODUODENOSCOPY (EGD) WITH PROPOFOL (N/A)  Patient location during evaluation: Endoscopy Anesthesia Type: General Level of consciousness: awake and alert Pain management: pain level controlled Vital Signs Assessment: post-procedure vital signs reviewed and stable Respiratory status: spontaneous breathing, nonlabored ventilation, respiratory function stable and patient connected to nasal cannula oxygen Cardiovascular status: blood pressure returned to baseline and stable Postop Assessment: no signs of nausea or vomiting Anesthetic complications: no    Last Vitals:  Filed Vitals:   02/08/16 1707 02/08/16 1708  BP: 132/101 116/75  Pulse: 104 96  Temp: 36.8 C   Resp: 18     Last Pain:  Filed Vitals:   02/08/16 1708  PainSc: 5                  Martha Clan

## 2016-02-08 NOTE — Progress Notes (Signed)
Per Endo nurse, Al Decant to give medications with sips.

## 2016-02-08 NOTE — Progress Notes (Signed)
[place] [facility] [place] NAME: Sara [name]    MR#:  [id number]  DATE OF BIRTH:  [birth year]  SUBJECTIVE:  CHIEF COMPLAINT:   Chief Complaint  Patient presents with  . Leg Pain   Better right leg and foot pain and Cough. REVIEW OF SYSTEMS:  CONSTITUTIONAL: No fever, fatigue or weakness.  EYES: No blurred or double vision.  EARS, NOSE, AND THROAT: No tinnitus or ear pain.  RESPIRATORY: has cough, no shortness of breath, wheezing or hemoptysis.  CARDIOVASCULAR: No chest pain, orthopnea, edema.  GASTROINTESTINAL: No nausea, vomiting, diarrhea or abdominal pain.  GENITOURINARY: No dysuria, hematuria.  ENDOCRINE: No polyuria, nocturia,  HEMATOLOGY: No anemia, easy bruising or bleeding SKIN: No rash or lesion. MUSCULOSKELETAL: Right leg and foot pain.   NEUROLOGIC: No tingling, numbness, weakness.  PSYCHIATRY: No anxiety or depression.   DRUG ALLERGIES:   Allergies  Allergen Reactions  . Amoxicillin Other (See Comments)    Yeast infection  . Vicodin [Hydrocodone-Acetaminophen] Hives and Rash    VITALS:  Blood pressure 110/65, pulse 90, temperature 98.6 F (37 C), temperature source Oral, resp. rate 17, height 5\' 4"  (1.626 m), weight 148 lb 8 oz (67.359 kg), SpO2 95 %.  PHYSICAL EXAMINATION:  GENERAL:  65 y.o.-year-old patient lying in the bed with no acute distress.  EYES: Pupils equal, round, reactive to light and accommodation. No scleral icterus. Extraocular muscles intact.  HEENT: Head atraumatic, normocephalic. Oropharynx and nasopharynx clear.  NECK:  Supple, no jugular venous distention. No thyroid enlargement, no tenderness.  LUNGS: Normal breath sounds bilaterally, no wheezing, rales,rhonchi or crepitation. No use of accessory muscles of respiration.  CARDIOVASCULAR: S1, S2 normal. No murmurs, rubs, or gallops.  ABDOMEN: Soft, nontender, nondistended. Bowel sounds present. No organomegaly or mass.  EXTREMITIES: No pedal  edema, cyanosis, or clubbing. dusky-appearing right lower extremity with poor palpable pulses.  NEUROLOGIC: Cranial nerves II through XII are intact. Muscle strength 5/5 in all extremities. Sensation intact. Gait not checked.  PSYCHIATRIC: The patient is alert and oriented x 3.  SKIN: No obvious rash, lesion, or ulcer.    LABORATORY PANEL:   CBC  Recent Labs Lab 02/08/16 0720  WBC 10.4  HGB 14.1  HCT 41.8  PLT 184   ------------------------------------------------------------------------------------------------------------------  Chemistries   Recent Labs Lab 02/05/16 1221  02/08/16 0720  NA 138  < > 138  K 3.6  < > 4.5  CL 103  < > 105  CO2 25  < > 26  GLUCOSE 106*  < > 107*  BUN 12  < > 12  CREATININE 1.16*  < > 0.97  CALCIUM 8.9  < > 8.7*  MG  --   --  2.1  AST 28  --   --   ALT 32  --   --   ALKPHOS 52  --   --   BILITOT 0.1*  --   --   < > = values in this interval not displayed. ------------------------------------------------------------------------------------------------------------------  Cardiac Enzymes  Recent Labs Lab 02/07/16 0730  TROPONINI <0.03   ------------------------------------------------------------------------------------------------------------------  RADIOLOGY:  Nm Myocar Multi W/spect W/wall Motion / Ef  02/07/2016   No T wave inversion was noted during stress.  There was no ST segment deviation noted during stress.  The study is normal.  This is a low risk study.  The left ventricular ejection fraction is normal (55-65%).     EKG:   Orders placed or performed in visit on  01/27/16  . EKG 12-Lead    ASSESSMENT AND PLAN:   65 year old female with past medical history of peripheral vascular disease, osteoporosis, peptic ulcer disease, depression, hyperlipidemia, who presents to the hospital due to right lower extremity pain and cyanotic appearing right lower extremity is noted to have acute PVD with acute superficial  femoral artery and popliteal artery occlusions.  1. Acute peripheral vascular disease-patient was noted to have acute superficial femoral artery on the right and also popliteal artery occlusions. 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 Continue heparin drip, pain control and follow-up Dr. Delana Meyer for possible angiography tomorrow  2. Chest pain-patient has had intermittent chest pain for the past week or so. No chest pain today. Normal stress test and echo.   3. Hx of GERD/PUD - cont. Protonix bid. Hemoglobin is stable. - watch for bleeding on heparin gtt. F/u GI for  EGD today.  4. Hyperlipidemia - cont. Atorvastatin  5. Depression - cont. Effexor, Wellbutrin  6. Osteoporosis - cont. Denosumab.   URI. Tessalon and Robitussin when necessary.  The patient is medically stable. Thank you for the consult. Sign off.  All the records are reviewed and case discussed with Care Management/Social Workerr. Management plans discussed with the patient, family and they are in agreement.  CODE STATUS: Full code  TOTAL TIME TAKING CARE OF THIS PATIENT: 23 minutes.  Greater than 50% time was spent on coordination of care and face-to-face counseling.  POSSIBLE D/C IN 2 DAYS, DEPENDING ON CLINICAL CONDITION.   Demetrios Loll M.D on 02/08/2016 at 1:09 PM  Between 7am to 6pm - Pager - [phone]  After 6pm go to [email or web address] - password [email or web address] [facility]  [facility]  Office  [phone]  CC: Primary care physician; Amy Annamary Rummage, NP

## 2016-02-08 NOTE — Progress Notes (Signed)
Endo was called to ask what time pt would be going down. Endo said it would be an add on and it would not be done until later today. RN informed the nurse that pt already had an angiogram scheduled for 1615 today. Endo stated they couldn't move her up any because she was an add on. Dr. Allen Norris notified and he stated, "If you can get Bobbie to change her to this morning, I will be glad to do her procedure this morning". Jolayne Haines was called and she said it would be extremely hard to get her done this morning because of scheduling and staffing issues. Dr. Delana Meyer was paged with no return call. RN will page again with this update.

## 2016-02-09 ENCOUNTER — Encounter
Admission: EM | Disposition: A | Discharge status: Home / Self Care | Payer: Self-pay | Source: Home / Self Care | Attending: Vascular Surgery

## 2016-02-09 HISTORY — PX: PERIPHERAL VASCULAR CATHETERIZATION: SHX172C

## 2016-02-09 LAB — CBC
HEMATOCRIT: 39.1 % (ref 35.0–47.0)
HEMATOCRIT: 40 % (ref 35.0–47.0)
Hemoglobin: 13.3 g/dL (ref 12.0–16.0)
Hemoglobin: 13.4 g/dL (ref 12.0–16.0)
MCH: 32 pg (ref 26.0–34.0)
MCH: 31.9 pg (ref 26.0–34.0)
MCHC: 34 g/dL (ref 32.0–36.0)
MCHC: 33.6 g/dL (ref 32.0–36.0)
MCV: 94.1 fL (ref 80.0–100.0)
MCV: 95.1 fL (ref 80.0–100.0)
PLATELETS: 145 10*3/uL — AB (ref 150–440)
Platelets: 162 10*3/uL (ref 150–440)
RBC: 4.15 MIL/uL (ref 3.80–5.20)
RBC: 4.21 MIL/uL (ref 3.80–5.20)
RDW: 13.4 % (ref 11.5–14.5)
RDW: 13.5 % (ref 11.5–14.5)
WBC: 9.4 10*3/uL (ref 3.6–11.0)
WBC: 11.6 10*3/uL — AB (ref 3.6–11.0)

## 2016-02-09 LAB — HEPARIN LEVEL (UNFRACTIONATED)
Heparin Unfractionated: 0.51 IU/mL (ref 0.30–0.70)
Heparin Unfractionated: 0.38 IU/mL (ref 0.30–0.70)

## 2016-02-09 LAB — BASIC METABOLIC PANEL
ANION GAP: 7 (ref 5–15)
BUN: 13 mg/dL (ref 6–20)
CALCIUM: 8.6 mg/dL — AB (ref 8.9–10.3)
CO2: 24 mmol/L (ref 22–32)
CREATININE: 0.87 mg/dL (ref 0.44–1.00)
Chloride: 105 mmol/L (ref 101–111)
GFR calc Af Amer: >60 mL/min (ref >60)
GFR calc non Af Amer: >60 mL/min (ref >60)
GLUCOSE: 112 mg/dL — AB (ref 65–99)
Potassium: 4.2 mmol/L (ref 3.5–5.1)
Sodium: 136 mmol/L (ref 135–145)

## 2016-02-09 LAB — FIBRINOGEN: FIBRINOGEN: 381 mg/dL (ref 210–470)

## 2016-02-09 LAB — GLUCOSE, CAPILLARY: GLUCOSE-CAPILLARY: 77 mg/dL (ref 65–99)

## 2016-02-09 LAB — MRSA PCR SCREENING: MRSA by PCR: NEGATIVE

## 2016-02-09 LAB — MAGNESIUM: Magnesium: 2.1 mg/dL (ref 1.7–2.4)

## 2016-02-09 SURGERY — ABDOMINAL AORTOGRAM W/LOWER EXTREMITY
Anesthesia: Moderate Sedation | Laterality: Right

## 2016-02-09 SURGERY — ABDOMINAL AORTOGRAM W/LOWER EXTREMITY
Anesthesia: Moderate Sedation

## 2016-02-09 MED ORDER — HEPARIN SODIUM (PORCINE) 1000 UNIT/ML IJ SOLN
INTRAMUSCULAR | Status: AC
  Filled 2016-02-09: qty 1

## 2016-02-09 MED ORDER — FENTANYL CITRATE (PF) 100 MCG/2ML IJ SOLN
INTRAMUSCULAR | Status: AC
  Filled 2016-02-09: qty 2

## 2016-02-09 MED ORDER — ONDANSETRON HCL 4 MG/2ML IJ SOLN: 4.0000 mg | Freq: Four times a day (QID) | INTRAMUSCULAR | Status: DC | PRN

## 2016-02-09 MED ORDER — MIDAZOLAM HCL 5 MG/5ML IJ SOLN
INTRAMUSCULAR | Status: AC
  Filled 2016-02-09: qty 5

## 2016-02-09 MED ORDER — ALTEPLASE 2 MG IJ SOLR
INTRAMUSCULAR | Status: DC | PRN
  Administered 2016-02-09: 8 mg

## 2016-02-09 MED ORDER — SODIUM CHLORIDE 0.9 % IV SOLN
0.5000 mg/h | INTRAVENOUS | Status: DC
  Administered 2016-02-09: 0.5 mg/h
  Filled 2016-02-09 (×3): qty 10

## 2016-02-09 MED ORDER — MIDAZOLAM HCL 2 MG/2ML IJ SOLN
INTRAMUSCULAR | Status: DC | PRN
  Administered 2016-02-09: 2 mg via INTRAVENOUS
  Administered 2016-02-09: 1 mg via INTRAVENOUS

## 2016-02-09 MED ORDER — LIDOCAINE HCL (PF) 1 % IJ SOLN
INTRAMUSCULAR | Status: AC
  Filled 2016-02-09: qty 30

## 2016-02-09 MED ORDER — HEPARIN SODIUM (PORCINE) 1000 UNIT/ML IJ SOLN
INTRAMUSCULAR | Status: DC | PRN
  Administered 2016-02-09: 3000 [IU] via INTRAVENOUS

## 2016-02-09 MED ORDER — MIDAZOLAM HCL 2 MG/2ML IJ SOLN: 1.0000 mg | INTRAMUSCULAR | Status: DC | PRN

## 2016-02-09 MED ORDER — SODIUM CHLORIDE 0.9% FLUSH
3.0000 mL | Freq: Two times a day (BID) | INTRAVENOUS | Status: DC
  Administered 2016-02-10: 3 mL via INTRAVENOUS

## 2016-02-09 MED ORDER — HYDRALAZINE HCL 20 MG/ML IJ SOLN: 5.0000 mg | INTRAMUSCULAR | Status: DC | PRN

## 2016-02-09 MED ORDER — HYDROCOD POLST-CPM POLST ER 10-8 MG/5ML PO SUER
ORAL | Status: AC
  Administered 2016-02-09: 15:00:00
  Filled 2016-02-09: qty 5

## 2016-02-09 MED ORDER — METOPROLOL TARTRATE 5 MG/5ML IV SOLN
5.0000 mg | Freq: Four times a day (QID) | INTRAVENOUS | Status: DC
  Administered 2016-02-10 – 2016-02-11 (×3): 5 mg via INTRAVENOUS
  Filled 2016-02-09 (×4): qty 5

## 2016-02-09 MED ORDER — LIDOCAINE HCL (PF) 1 % IJ SOLN
INTRAMUSCULAR | Status: DC | PRN
  Administered 2016-02-09: 5 mL

## 2016-02-09 MED ORDER — MORPHINE SULFATE (PF) 4 MG/ML IV SOLN
5.0000 mg | INTRAVENOUS | Status: DC | PRN
Start: 2016-02-09 — End: 2016-02-12
  Administered 2016-02-09 (×2): 5 mg via INTRAVENOUS
  Administered 2016-02-09: 4 mg via INTRAVENOUS
  Administered 2016-02-10 (×3): 5 mg via INTRAVENOUS
  Filled 2016-02-09: qty 2
  Filled 2016-02-09: qty 1
  Filled 2016-02-09 (×4): qty 2

## 2016-02-09 MED ORDER — LABETALOL HCL 5 MG/ML IV SOLN
10.0000 mg | INTRAVENOUS | Status: DC | PRN
  Filled 2016-02-09: qty 4

## 2016-02-09 MED ORDER — FENTANYL CITRATE (PF) 100 MCG/2ML IJ SOLN
INTRAMUSCULAR | Status: DC | PRN
  Administered 2016-02-09 (×2): 50 ug via INTRAVENOUS

## 2016-02-09 MED ORDER — HEPARIN (PORCINE) IN NACL 100-0.45 UNIT/ML-% IJ SOLN
800.0000 [IU]/h | INTRAMUSCULAR | Status: DC
  Administered 2016-02-09: 800 [IU]/h via INTRAVENOUS
  Filled 2016-02-09 (×2): qty 250

## 2016-02-09 MED ORDER — HYDROCOD POLST-CPM POLST ER 10-8 MG/5ML PO SUER: 5.0000 mL | Freq: Once | ORAL | Status: DC

## 2016-02-09 MED ORDER — SODIUM CHLORIDE 0.9% FLUSH: 3.0000 mL | INTRAVENOUS | Status: DC | PRN

## 2016-02-09 MED ORDER — IOPAMIDOL (ISOVUE-300) INJECTION 61%
INTRAVENOUS | Status: DC | PRN
  Administered 2016-02-09: 75 mL via INTRA_ARTERIAL

## 2016-02-09 MED ORDER — HEPARIN (PORCINE) IN NACL 100-0.45 UNIT/ML-% IJ SOLN
INTRAMUSCULAR | Status: AC
  Filled 2016-02-09: qty 250

## 2016-02-09 MED ORDER — ALTEPLASE 2 MG IJ SOLR
INTRAMUSCULAR | Status: AC
  Filled 2016-02-09: qty 8

## 2016-02-09 MED ORDER — SODIUM CHLORIDE 0.9 % IV SOLN: 250.0000 mL | INTRAVENOUS | Status: DC | PRN

## 2016-02-09 SURGICAL SUPPLY — 14 items
BALLN ULTRVRSE 3X40X130C (BALLOONS) ×2
BALLOON ULTRVRSE 3X40X130C (BALLOONS) ×1 IMPLANT
CATH INFUS 135CMX30CM (CATHETERS) ×2 IMPLANT
DEVICE PRESTO INFLATION (MISCELLANEOUS) ×2 IMPLANT
DEVICE SOLENT OMNI 120CM (CATHETERS) ×2 IMPLANT
GLIDECATH ANGLED 4FR 120CM (CATHETERS) ×2 IMPLANT
GUIDEWIRE ANGLED .035X260CM (WIRE) ×2 IMPLANT
KIT CATH CVC 3 LUMEN 7FR 8IN (MISCELLANEOUS) ×2 IMPLANT
PACK ANGIOGRAPHY (CUSTOM PROCEDURE TRAY) ×2 IMPLANT
SET INTRO CAPELLA COAXIAL (SET/KITS/TRAYS/PACK) ×2 IMPLANT
SHEATH ANL2 6FRX45 HC (SHEATH) ×2 IMPLANT
SHEATH BRITE TIP 5FRX11 (SHEATH) ×2 IMPLANT
SUPPORT SCROTAL XLRG STRP (MISCELLANEOUS) ×2 IMPLANT
WIRE J 3MM .035X145CM (WIRE) ×2 IMPLANT

## 2016-02-09 NOTE — Op Note (Signed)
[facility] Percutaneous Study/Intervention Procedural Note   Date of Surgery: 02/09/2016  Surgeon:  [name], MD.  Pre-operative Diagnosis: Ischemia right lower extremity; complication vascular device with occlusion of bypass graft  Post-operative diagnosis: Same  Procedure(s) Performed: 1. Introduction catheter into right lower extremity 3rd order catheter placement  2. Contrast injection right lower extremity for distal runoff   3.  Angiojet mechanical thrombectomy with the Omni device 4.  Percutaneous transluminal angioplasty right tibioperoneal trunk with a 3 mm balloon             5.   Infusion of TPA for thrombolysis              Anesthesia: Conscious sedation was administered under my direct supervision. IV Versed plus fentanyl were utilized. Continuous ECG, pulse oximetry and blood pressure was monitored throughout the entire procedure. Versed and fentanyl were utilized.  Conscious sedation was  for a total of 1 hour 40 minutes.  Sheath: 6 [place] Ansell left common femoral  Contrast: 75 cc  Fluoroscopy Time: 9.9 minutes  Indications: Sara [name] presents with ischemic changes to the right foot. She has an extensive history with multiple bypass procedures in the past. She felt the fairly abrupt onset of pain in her foot and presented to the hospital. She has been placed on heparin and has undergone full cardiac workup as well as clearance by GI. The risks and benefits for angiography with the [name] for intervention for limb salvage are reviewed all questions answered patient agrees to proceed.  Procedure: [name] is a 65 y.o. y.o. female who was identified and appropriate procedural time out was performed. The patient was then placed supine on the table and prepped and draped in the usual sterile fashion.   Ultrasound was placed in the sterile sleeve and the left groin was evaluated  the left common femoral artery was echolucent and pulsatile indicating patency.  Image was recorded for the permanent record and under real-time visualization a microneedle was inserted into the common femoral artery microwire followed by a micro-sheath.  A J-wire was then advanced through the micro-sheath and a  5 [place] sheath was then inserted over a J-wire. J-wire was then advanced and a 5 French pigtail catheter was positioned at the level of T12. AP projection of the aorta was then obtained. Pigtail catheter was repositioned to above the bifurcation and a LAO view of the pelvis was obtained.  Subsequently a pigtail catheter with the stiff angle Glidewire was used to cross the aortic bifurcation the catheter wire were advanced down into the right distal external iliac artery. Oblique view of the femoral bifurcation was then obtained and subsequently the wire was reintroduced and the pigtail catheter negotiated into the profunda femoris representing third order catheter placement. Distal runoff was then performed.  3000 units of heparin was then given and allowed to circulate and a 6 [place] Ansell sheath was advanced up and over the bifurcation and positioned in the femoral artery  TPA was then reconstituted and a Omni AngioJet was used to lace the entire length of the occlusion with 8 milligrams of TPA. TPA was then allowed to dwell for 30 minutes. Follow-up imaging demonstrated incomplete thrombolysis and therefore the AngioJet catheter was used to achieve a more thorough thrombectomy. Multiple passes were made after which follow-up imaging was obtained. Sluggish and incomplete flow is noted through the graft at this time.    A narrowing at the distal venous anastomosis where the  vein is apparently joined to the posterior tibial is noted. A 3 x 40 all traverse balloon was used to angioplasty the distal anastomosis and proximal posterior tibial to 14 atmospheres for 2 minutes. Follow-up imaging  demonstrated improvement so the contrast is now flowing but still there is significant thrombus noted within the graft and normal forward flow has not yet been achieved.  Therefore an infusion catheter is advanced over the wire and positioned across the leading or proximal anastomosis into the graft. Hand injection of contrast verifies that there are segments residual of thrombus. The catheter is then flushed with heparinized saline. The sheath is then secured to the skin of the thigh with 0 silk Steri-Strips and the catheter secured with Tegaderm as well.    Findings: The abdominal aorta is opacified with a bolus injection contrast. Renal arteries are patent. The aorta itself has diffuse disease but no hemodynamically significant lesions. The common and external iliac arteries are widely patent bilaterally.  The right common femoral is widely patent as is the profunda femoris. There is a flush occlusion of the native SFA. There is a small cul-de-sac consistent with one of vein bypass'. Distal runoff demonstrates single-vessel via the posterior tibial.  Upon engaging the cul-de-sac I am able to cross the entire length of the bypass and the catheter and wire are negotiated down into the posterior tibial where hand injection contrast completes distal runoff and shows the posterior tibial to be widely patent to the foot filling the lateral plantar medial plantar and pedal arch. On this injection in the proximal one third of the posterior tibial there is also filling of the peroneal although not from its origin distally there is also a fairly long segment of anterior tibial which is noted although this does not cross the foot and the dorsalis pedis is not seen.  Following TPA dwell there is improvement in the thrombus burden as noted on hand injection. Also there is a narrowing at the distal anastomosis and this is improved after angioplasty with a 3 mm balloon.    Disposition:  Patient was taken to the ICU where she will undergo TPA infusion.  Follow-up angiography is planned for tomorrow morning where further treatment will be based on the findings after she has had lytic therapy overnight    [name] 02/09/2016,6:16 PM

## 2016-02-09 NOTE — Op Note (Signed)
OPERATIVE NOTE   PROCEDURE: 1. Insertion of triple-lumen central venous catheter right IJ approach.  PRE-OPERATIVE DIAGNOSIS: Ischemia right leg undergoing thrombolytic therapy  POST-OPERATIVE DIAGNOSIS: Same  SURGEON: [name] M.D.  ANESTHESIA: 1% lidocaine local infiltration  ESTIMATED BLOOD LOSS: Minimal cc  INDICATIONS:   Sara [name] is a 65 y.o. female who presents with anemia right leg. She will be undergoing thrombolysis overnight and therefore requires appropriate intravenous access..  DESCRIPTION: After obtaining full informed written consent, the patient was positioned supine. The right neck was prepped and draped in a sterile fashion. Ultrasound was placed in a sterile sleeve. Ultrasound was utilized to identify the right internal jugular vein which is noted to be echolucent and compressible indicating patency. Images recorded for the permanent record. Under real-time visualization a Seldinger needle is inserted into the vein and the guidewires advanced without difficulty. Small counterincision was made at the wire insertion site. Dilators passed over the wire and the triple-lumen catheter is fed without difficulty.  All 3 lm aspirate and flush easily and are packed with heparin saline. Catheter secured to the skin of the right neck with 2-0 silk. A sterile dressing is applied with Biopatch.  COMPLICATIONS: None  CONDITION: Good  [name], M.D. [facility]. Office:  [phone]   02/09/2016, 6:32 PM

## 2016-02-09 NOTE — Care Management Important Message (Signed)
Important Message  Patient Details  Name: Sara [name] MRN: [id number] Date of Birth: [birth year]   Medicare Important Message Given:  Yes    Juliann Pulse A [name] 02/09/2016, 2:11 PM

## 2016-02-09 NOTE — Progress Notes (Addendum)
eLink Physician-Brief Progress Note Patient Name: Sara [name] DOB: [birth year] MRN: [id number]   Date of Service  02/09/2016  HPI/Events of Note  65 year old female with past medical history of peripheral vascular disease, osteoporosis, peptic ulcer disease, depression, hyperlipidemia, who presents to the hospital due to right lower extremity pain and cyanotic appearing right lower extremity is noted to have acute PVD with acute superficial femoral artery and popliteal artery occlusions. Now s/p angiojet mechanical thrombectomy and in ICU for tPA infusion. BP - 142/83 and HR = 92. Temp = 98.9 F. On room air with Sat = 92% and RR = 24. Management per Vascular Surgery and Hospitalist.   eICU Interventions  Continue present management.      Intervention Category Evaluation Type: New Patient Evaluation  Lysle Dingwall 02/09/2016, 8:02 PM

## 2016-02-09 NOTE — Consult Note (Signed)
ANTICOAGULATION CONSULT NOTE - Initial Consult  Pharmacy Consult for heparin drip Indication: thrombosis of right leg bypass  Allergies  Allergen Reactions  . Amoxicillin Other (See Comments)    Yeast infection  . Vicodin [Hydrocodone-Acetaminophen] Hives and Rash    Patient Measurements: Height: 5\' 4"  (162.6 cm) Weight: 149 lb (67.586 kg) IBW/kg (Calculated) : 54.7 Heparin Dosing Weight: 63.5kg  Vital Signs: Temp: 98 F (36.7 C) (06/21 0452) Temp Source: Oral (06/21 0452) BP: 118/66 mmHg (06/21 0452) Pulse Rate: 96 (06/21 0452)  Labs:  Recent Labs  02/07/16 0330 02/07/16 0730 02/08/16 0720 02/09/16 0613  HGB 13.9  --  14.1 13.3  HCT 41.4  --  41.8 39.1  PLT 190  --  184 162  HEPARINUNFRC 0.54  --  0.58 0.51  CREATININE  --   --  0.97 0.87  TROPONINI  --  <0.03  --   --     Estimated Creatinine Clearance: 62.6 mL/min (by C-G formula based on Cr of 0.87).   Medical History: Past Medical History  Diagnosis Date  . GERD (gastroesophageal reflux disease)   . Osteoporosis   . Vascular disease     Sees Dr. Delana Meyer  . Vertigo     Last episode approx Aug 2015  . Depression   . History of blood clots   . Stomach ulcer     Medications:  Scheduled:  . atorvastatin  10 mg Oral Daily  . buPROPion  150 mg Oral Daily  . calcium-vitamin D  3 tablet Oral Daily  . clindamycin (CLEOCIN) IV  300 mg Intravenous On Call to OR  . dextromethorphan  30 mg Oral BID   And  . guaiFENesin  600 mg Oral BID  . docusate sodium  100 mg Oral BID  . pantoprazole  40 mg Oral BID  . predniSONE  40 mg Oral Q breakfast  . sodium chloride flush  3 mL Intravenous Q12H  . 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 Pharmacy consulted to dose heparin drip. Per Dr. Delana Meyer pt has no CI to full anticoagulation and ok to use nomogram  Goal of Therapy:  Heparin level 0.3-0.7  units/ml Monitor platelets by anticoagulation protocol: Yes   Plan:  Heparin level therapeutic. Continue current rate. Will recheck HL and CBC with AM labs.  Laural Benes, Pharm.D., BCPS Clinical Pharmacist 02/09/2016,6:52 AM

## 2016-02-10 ENCOUNTER — Encounter: Payer: Self-pay | Admitting: Gastroenterology

## 2016-02-10 ENCOUNTER — Encounter
Admission: EM | Disposition: A | Discharge status: Home / Self Care | Payer: Self-pay | Source: Home / Self Care | Attending: Vascular Surgery

## 2016-02-10 HISTORY — PX: PERIPHERAL VASCULAR CATHETERIZATION: SHX172C

## 2016-02-10 LAB — HEPARIN LEVEL (UNFRACTIONATED)
HEPARIN UNFRACTIONATED: 0.35 [IU]/mL (ref 0.30–0.70)
Heparin Unfractionated: 0.36 IU/mL (ref 0.30–0.70)

## 2016-02-10 LAB — BASIC METABOLIC PANEL
Anion gap: 5 (ref 5–15)
BUN: 10 mg/dL (ref 6–20)
CHLORIDE: 110 mmol/L (ref 101–111)
CO2: 23 mmol/L (ref 22–32)
CREATININE: 0.67 mg/dL (ref 0.44–1.00)
Calcium: 7.6 mg/dL — ABNORMAL LOW (ref 8.9–10.3)
GFR calc non Af Amer: >60 mL/min (ref >60)
Glucose, Bld: 116 mg/dL — ABNORMAL HIGH (ref 65–99)
POTASSIUM: 4.3 mmol/L (ref 3.5–5.1)
SODIUM: 138 mmol/L (ref 135–145)

## 2016-02-10 LAB — CBC
HEMATOCRIT: 37.5 % (ref 35.0–47.0)
HEMATOCRIT: 40.3 % (ref 35.0–47.0)
HEMOGLOBIN: 13.1 g/dL (ref 12.0–16.0)
Hemoglobin: 12.4 g/dL (ref 12.0–16.0)
MCH: 31.5 pg (ref 26.0–34.0)
MCH: 31.6 pg (ref 26.0–34.0)
MCHC: 33 g/dL (ref 32.0–36.0)
MCHC: 32.6 g/dL (ref 32.0–36.0)
MCV: 95.6 fL (ref 80.0–100.0)
MCV: 96.8 fL (ref 80.0–100.0)
PLATELETS: 103 10*3/uL — AB (ref 150–440)
Platelets: 113 10*3/uL — ABNORMAL LOW (ref 150–440)
RBC: 3.92 MIL/uL (ref 3.80–5.20)
RBC: 4.16 MIL/uL (ref 3.80–5.20)
RDW: 13.2 % (ref 11.5–14.5)
RDW: 13.5 % (ref 11.5–14.5)
WBC: 11.9 10*3/uL — ABNORMAL HIGH (ref 3.6–11.0)
WBC: 13.9 10*3/uL — ABNORMAL HIGH (ref 3.6–11.0)

## 2016-02-10 LAB — FIBRINOGEN
Fibrinogen: 186 mg/dL — ABNORMAL LOW (ref 210–470)
Fibrinogen: 161 mg/dL — ABNORMAL LOW (ref 210–470)

## 2016-02-10 SURGERY — LOWER EXTREMITY ANGIOGRAPHY
Anesthesia: Moderate Sedation | Laterality: Right

## 2016-02-10 MED ORDER — TIROFIBAN (AGGRASTAT) BOLUS VIA INFUSION
25.0000 ug/kg | Freq: Once | INTRAVENOUS | Status: AC
  Administered 2016-02-10: 1687.5 ug via INTRAVENOUS
  Filled 2016-02-10: qty 34

## 2016-02-10 MED ORDER — TIROFIBAN HCL IN NACL 5-0.9 MG/100ML-% IV SOLN
0.1500 ug/kg/min | INTRAVENOUS | Status: DC
  Administered 2016-02-10 – 2016-02-11 (×2): 0.15 ug/kg/min via INTRAVENOUS
  Filled 2016-02-10: qty 100

## 2016-02-10 MED ORDER — IOPAMIDOL (ISOVUE-300) INJECTION 61%
INTRAVENOUS | Status: DC | PRN
  Administered 2016-02-10: 30 mL via INTRA_ARTERIAL

## 2016-02-10 MED ORDER — MIDAZOLAM HCL 2 MG/2ML IJ SOLN
INTRAMUSCULAR | Status: DC | PRN
  Administered 2016-02-10: 1 mg via INTRAVENOUS
  Administered 2016-02-10: 2 mg via INTRAVENOUS

## 2016-02-10 MED ORDER — FENTANYL CITRATE (PF) 100 MCG/2ML IJ SOLN
INTRAMUSCULAR | Status: AC
  Filled 2016-02-10: qty 2

## 2016-02-10 MED ORDER — MIDAZOLAM HCL 5 MG/5ML IJ SOLN
INTRAMUSCULAR | Status: AC
  Filled 2016-02-10: qty 5

## 2016-02-10 MED ORDER — VENLAFAXINE HCL ER 75 MG PO CP24
75.0000 mg | ORAL_CAPSULE | Freq: Every day | ORAL | Status: DC
  Administered 2016-02-10 – 2016-02-15 (×6): 75 mg via ORAL
  Filled 2016-02-10 (×6): qty 1

## 2016-02-10 MED ORDER — LIDOCAINE HCL (PF) 1 % IJ SOLN
INTRAMUSCULAR | Status: AC
Start: 2016-02-10 — End: 2016-02-10
  Filled 2016-02-10: qty 30

## 2016-02-10 MED ORDER — HEPARIN SODIUM (PORCINE) 1000 UNIT/ML IJ SOLN
INTRAMUSCULAR | Status: AC
  Filled 2016-02-10: qty 1

## 2016-02-10 MED ORDER — FENTANYL CITRATE (PF) 100 MCG/2ML IJ SOLN
INTRAMUSCULAR | Status: DC | PRN
  Administered 2016-02-10 (×2): 50 ug via INTRAVENOUS

## 2016-02-10 MED ORDER — TIROFIBAN HCL IV 12.5 MG/250 ML
INTRAVENOUS | Status: AC
  Filled 2016-02-10: qty 250

## 2016-02-10 MED ORDER — DEXTROSE 5 % IV SOLN
1.5000 g | Freq: Once | INTRAVENOUS | Status: AC
  Administered 2016-02-10: 1.5 g via INTRAVENOUS

## 2016-02-10 MED ORDER — DEXTROSE 5 % IV SOLN
INTRAVENOUS | Status: AC
  Filled 2016-02-10 (×44): qty 1.5

## 2016-02-10 MED ORDER — HEPARIN SODIUM (PORCINE) 1000 UNIT/ML IJ SOLN
INTRAMUSCULAR | Status: DC | PRN
  Administered 2016-02-10: 4000 [IU] via INTRAVENOUS

## 2016-02-10 SURGICAL SUPPLY — 14 items
BALLN LUTONIX DCB 4X40X130 (BALLOONS) ×2
BALLN LUTONIX DCB 7X40X130 (BALLOONS) ×2
BALLN ULTRVRSE 3X40X150 (BALLOONS) ×1
BALLN ULTRVRSE 3X40X150 OTW (BALLOONS) ×1
BALLOON LUTONIX DCB 4X40X130 (BALLOONS) ×1 IMPLANT
BALLOON LUTONIX DCB 7X40X130 (BALLOONS) ×1 IMPLANT
BALLOON ULTRVRSE 3X40X150 OTW (BALLOONS) ×1 IMPLANT
CATH GWIRE MARINER STRGHT 4FR (CATHETERS) ×2 IMPLANT
DEVICE PRESTO INFLATION (MISCELLANEOUS) ×2 IMPLANT
DEVICE SOLENT OMNI 120CM (CATHETERS) ×2 IMPLANT
DEVICE STARCLOSE SE CLOSURE (Vascular Products) ×2 IMPLANT
PACK ANGIOGRAPHY (CUSTOM PROCEDURE TRAY) ×2 IMPLANT
WIRE G V18X300CM (WIRE) ×2 IMPLANT
WIRE HI TORQ VERSACORE 300 (WIRE) ×2 IMPLANT

## 2016-02-10 NOTE — Progress Notes (Signed)
1120=left groin dressing noted to have bright red blood.  Removed and TR band applied with 40cc of air.   Marland Kitchen

## 2016-02-10 NOTE — Progress Notes (Signed)
Dr. Delana Meyer came by to see patient this evening and patient and RN made MD aware that when patient dorsiflexes both feet she has pain in her legs. MD acknowledged and spoke with patient about pain.  Patient asked about her effexor being ordered and MD gave order for RN to order effexor per patient's med list. Patient is A&Ox4.  Denies pain.  NSR per cardiac monitor with stable vital signs. Urine has cleared up and is amber now. Groin site free of complication.

## 2016-02-10 NOTE — Progress Notes (Signed)
Patient transported to Special Procedures by Sara [name], orderly with Margreta Journey, RN.  Report called to Junie Panning, RN who is taking over patient's care.

## 2016-02-10 NOTE — Op Note (Signed)
[facility] Percutaneous Study/Intervention Procedural Note   Date of Surgery: 02/10/2016  Surgeon:  [name], MD.  Pre-operative Diagnosis: Ischemia right lower extremity; application vascular device with thrombosis of bypass graft; ischemic rest pain right lower extremity  Post-operative diagnosis: Same  Procedure(s) Performed: 1. Introduction catheter into right lower extremity 3rd order catheter placement  2. Contrast injection right lower extremity for distal runoff   3.  Angiojet mechanical thrombectomy with the Omni device 4.  Percutaneous transluminal angioplasty to 7 mm with Lutonix right femoral to below-knee bypass graft             5.   Percutaneous transluminal angioplasty to 3 mm distal posterior tibial             6.   Star close closure left common femoral arteriotomy                  Anesthesia: Conscious sedation was administered under my direct supervision. IV Versed plus fentanyl were utilized. Continuous ECG, pulse oximetry and blood pressure was monitored throughout the entire procedure.Versed and fentanyl were utilized.  Conscious sedation was for a total of 1 hour 45 minutes minutes.  Sheath: 6 [place] Ansell left common femoral existing  Contrast: 30 cc  Fluoroscopy Time: 6.0 minutes  Indications: Sara [name] presents with ischemia of the right lower extremity. Yesterday she underwent the initiation of thrombolysis. She is now returning to the angiography suite for reevaluation and further treatment for limb salvage. The risks and benefits are reviewed all questions answered patient agrees to proceed.  Procedure: Sara [name] is a 65 y.o. y.o. female who was identified and appropriate procedural time out was performed. The patient was then placed supine on the table and prepped and draped in the usual sterile fashion.   The left groin and catheter systems were prepped  and draped in a sterile fashion. Distal runoff was then performed through the sheath. This demonstrated flow through the bypass at this time. Therefore a 300 versus a core wire was advanced under fluoroscopic guidance down into the posterior tibial.  4000 units of heparin was then given and allowed to circulate.    A 4 x 4 Lutonix balloon was used to angioplasty the distal vein graft and the anastomosis. Inflation was to 12 atm for 2 minutes. More proximally within the vein graft a valve appeared to be sclerotic with a small amount of retained thrombus and this was treated with a 7 x 4 Lutonix to 8 atm for 2 minutes. The level of this balloon inflation was equal with that of the femoral condyle. Distal runoff was then reassessed.  In the posterior tibial which is the single vessel runoff a area of stricture is noted just at the level of the medial malleolus. A VAT and wires then negotiated past this lesion and a 3 x 4 ultra versed balloon is used to angioplasty this area to 8 atm for 1 minute. Follow-up imaging demonstrates resolution of this lesion. Distal imaging is then performed and shows sluggish flow in the distal forefoot and toes. There is delayed filling of the tarsal arteries. There appears to be a moderate amount of retained clot within the forefoot. This is not an area that is amenable to intervention and therefore I elected to terminate the TPA and will begin Aggrastat.  After review of these images the sheath is pulled into the left external iliac oblique of the common femoral is obtained and a Star close  device deployed. There no immediate Complications.  Findings:   The right common femoral is widely patent as is the profunda femoris.  The SFA remains occluded. There is now fairly good flow within the bypass graft itself. The rock small three fourths of the graft is of good caliber without evidence of retained thrombus or stricture or stenosis. At the level of the knee there appears to  be a sclerotic valve with some retained thrombus. At the actual anastomosis there is a 70-80% narrowing. This was treated yesterday with a 3 mm inflation but does not appear to have been adequately dilated aced on this new image. Therefore a 4 mm Lutonix balloon is used to dilate the distal anastomosis as noted above and then more proximally within the vein graft a 7 mm balloon is used to treat the strictured valve. Follow-up imaging after these inflations demonstrates less than 5% residual stenosis with an excellent result no evidence of a flap or dissection no thrombus is noted and there is now a significant improvement in flow.  Distally, the posterior tibial which is the single vessel runoff and straights a narrowing at the ankle level and this is treated with a 3 mm inflation effectively.  Final follow-up imaging with distal runoff demonstrates some thrombus in the forefoot this is not an area amenable to intervention and therefore Aggrastat is being started. Overall her leg and foot are much improved, the bypass is now patent. Her forefoot is slightly cool but does not appear to be threatened at this time.   SUMMARY: Successful thrombolysis of right femoral to distal bypass graft. The graft is now patent and there is in-line flow to the pedal arch. Several areas of narrowing have been well treated with angioplasty as described above.  Disposition: Patient was taken to the recovery room in stable condition having tolerated the procedure well.  Sara [name], Sara [name] 02/10/2016,11:14 AM

## 2016-02-10 NOTE — Progress Notes (Signed)
[facility]  Daily Progress Note  Subjective: 1 Day Post-Op: Insertion of triple-lumen central venous catheter right IJ approach. Introduction catheter into right lower extremity 3rd order catheter placement, Contrast injection right lower extremity for distal runoff, Agiojet mechanical thrombectomy with the Omni device, Percutaneous transluminal angioplasty right tibioperoneal trunk with a 3 mm balloon, Infusion of TPA for thrombolysis.  Patient with some right lower extremity pain overnight - now improved this AM with morphine. Stated some oozing from left groin catheter site.  Objective: Filed Vitals:   02/10/16 0419 02/10/16 0500 02/10/16 0600 02/10/16 0700  BP:  116/76 97/67 109/74  Pulse:  94 81 83  Temp: 97.9 F (36.6 C)     TempSrc: Oral     Resp:  21 15 17   Height:      Weight:  67.5 kg (148 lb 13 oz)    SpO2:  93% 93% 94%    Intake/Output Summary (Last 24 hours) at 02/10/16 0726 Last data filed at 02/10/16 0700  Gross per 24 hour  Intake 2088.3 ml  Output   1000 ml  Net 1088.3 ml   Physical Exam: A&Ox3, NAD Neck: Right IJ inplace - no swelling or draining. CV: RRR Pulmonary: CTA Bilaterally Abdomen: Soft, Nontender, Nondistended Vascular:  Left Groin: Catheter in place with minimal oozing, no swelling noted.  Right Lower Extremity: Toes cold and pale, no DP. Extremity warm from mid-foot proximally.    Laboratory: CBC    Component Value Date/Time   WBC 11.9* 02/10/2016 0447   WBC 4.3 09/21/2015 0916   HGB 12.4 02/10/2016 0447   HCT 37.5 02/10/2016 0447   HCT 44.4 09/21/2015 0916   PLT 103* 02/10/2016 0447   PLT 222 09/21/2015 0916   BMET    Component Value Date/Time   NA 138 02/10/2016 0447   NA 139 09/21/2015 0916   K 4.3 02/10/2016 0447   CL 110 02/10/2016 0447   CO2 23 02/10/2016 0447   GLUCOSE 116* 02/10/2016 0447   GLUCOSE 85 09/21/2015 0916   BUN 10 02/10/2016 0447   BUN 12 09/21/2015 0916   CREATININE 0.67 02/10/2016  0447   CALCIUM 7.6* 02/10/2016 0447   GFRNONAA >60 02/10/2016 0447   GFRAA >60 02/10/2016 0447   Assessment/Planning: 65 year old female s/p right lower extremity ischemia with TPA infusion overnight. 1) Take back to IR suite this AM for follow up angiography 2) Pain Control  Marcelle Overlie PA-C 02/10/2016 7:26 AM

## 2016-02-10 NOTE — Progress Notes (Signed)
Took Verbal order from Dr. Delana Meyer to Stop TPA drip and start Tirofiban per protocol. Patient's current weight = 67.5kg with CrCl 67.9 ml/min Entered orders for bolus and drip.  Contacted RN in [place] to notify her to start ASAP per MD note. Moesha Sarchet K 02/10/2016 11:44 AM

## 2016-02-10 NOTE — Plan of Care (Signed)
Thrombolysis has gone well and her bypass is now open.  The stricture at the distal anastomosis and in the distal bypass has been well treated with PTA using Lutonix.  An area in the distal PT is also well treated with a 3 mm PTA.  Final imaging does reveal a small amount of residual thrombus in the forefoot area.  Therefore I will begin Aggrastat  I have spoken with Santiago Glad in Pharmacy to let them know the Tpa is finished and that we will gegin Aggrastat ASAP

## 2016-02-10 NOTE — Consult Note (Signed)
ANTICOAGULATION CONSULT NOTE - Initial Consult  Pharmacy Consult for heparin drip Indication: thrombosis of right leg bypass  Allergies  Allergen Reactions  . Amoxicillin Other (See Comments)    Yeast infection  . Vicodin [Hydrocodone-Acetaminophen] Hives and Rash    Patient Measurements: Height: 5\' 4"  (162.6 cm) Weight: 148 lb 13 oz (67.5 kg) IBW/kg (Calculated) : 54.7 Heparin Dosing Weight: 63.5kg  Vital Signs: Temp: 97.9 F (36.6 C) (06/22 0419) Temp Source: Oral (06/22 0419) BP: 109/74 mmHg (06/22 0700) Pulse Rate: 83 (06/22 0700)  Labs:  Recent Labs  02/07/16 0730  02/08/16 0720 02/09/16 [id number] 02/09/16 1938 02/10/16 0008 02/10/16 0447  HGB  --   < > 14.1 13.3 13.4 13.1 12.4  HCT  --   < > 41.8 39.1 40.0 40.3 37.5  PLT  --   < > 184 162 145* 113* 103*  HEPARINUNFRC  --   < > 0.58 0.51 0.38 0.36 0.35  CREATININE  --   --  0.97 0.87  --   --  0.67  TROPONINI <0.03  --   --   --   --   --   --   < > = values in this interval not displayed.  Estimated Creatinine Clearance: 67.9 mL/min (by C-G formula based on Cr of 0.67).   Medical History: Past Medical History  Diagnosis Date  . GERD (gastroesophageal reflux disease)   . Osteoporosis   . Vascular disease     Sees Dr. Delana Meyer  . Vertigo     Last episode approx Aug 2015  . Depression   . History of blood clots   . Stomach ulcer     Medications:  Scheduled:  . atorvastatin  10 mg Oral Daily  . buPROPion  150 mg Oral Daily  . calcium-vitamin D  3 tablet Oral Daily  . chlorpheniramine-HYDROcodone  5 mL Oral Once  . dextromethorphan  30 mg Oral BID   And  . guaiFENesin  600 mg Oral BID  . docusate sodium  100 mg Oral BID  . metoprolol  5 mg Intravenous Q6H  . pantoprazole  40 mg Oral BID  . predniSONE  40 mg Oral Q breakfast  . sodium chloride flush  3 mL Intravenous Q12H  . sodium chloride flush  3 mL Intravenous Q12H    Assessment: Pt is a 65 year old female with a PMH of vascular disease with  a thrombosis of right leg bypass. Pharmacy consulted to dose heparin drip. Per Dr. Delana Meyer pt has no CI to full anticoagulation and ok to use nomogram  Goal of Therapy:  Heparin level 0.3-0.7 units/ml Monitor platelets by anticoagulation protocol: Yes   Plan:  Heparin level therapeutic. Continue current rate. Will recheck HL and CBC with AM labs.  Laural Benes, Pharm.D., BCPS Clinical Pharmacist 02/10/2016,7:06 AM

## 2016-02-11 ENCOUNTER — Encounter: Payer: Self-pay | Admitting: Vascular Surgery

## 2016-02-11 MED ORDER — GUAIFENESIN-DM 100-10 MG/5ML PO SYRP
5.0000 mL | ORAL_SOLUTION | ORAL | Status: DC | PRN
  Administered 2016-02-11 – 2016-02-14 (×6): 5 mL via ORAL
  Filled 2016-02-11 (×6): qty 5

## 2016-02-11 NOTE — Progress Notes (Signed)
Chaplain rounded the unit to provide a compassionate presence and support to the patient.  During the visit the patient stated that she felt better today than yesterday altho still experiencing pain.  Minerva Fester [phone]

## 2016-02-11 NOTE — Progress Notes (Signed)
Patient requested removal of foley. Removed per patient request. Deflated PAD and place gauze. No active bleeding or hematoma noted.

## 2016-02-11 NOTE — Care Management (Signed)
Attempted assessment on patient and she is resting quietly with her eyes closed.  Prior to admission, patient was independent, active and was able to perform her own ADL's. She lives at home with her sister. PCP is Fredia Sorrow, NP.

## 2016-02-11 NOTE — Progress Notes (Signed)
Nutrition Follow-up  DOCUMENTATION CODES:   Not applicable  INTERVENTION:  -Cater to pt preferences -Recommend administering prn bowel regimen as pt without BM for 5 days  NUTRITION DIAGNOSIS:   Inadequate oral intake related to poor appetite, altered GI function as evidenced by per patient/family report.  Improving as diet advanced to Regular, appetite good, eating 100% of meals  GOAL:   Patient will meet greater than or equal to 90% of their needs  MONITOR:   PO intake, Diet advancement  REASON FOR ASSESSMENT:   Malnutrition Screening Tool    ASSESSMENT:   Pt with ischemic right leg s/p vascular intervention  Diet Order:  Diet - low sodium heart healthy Diet regular Room service appropriate?: Yes; Fluid consistency:: Thin   Energy Intake: recorded po intake 100% of meals  Skin:  Reviewed, no issues  Last BM:  6/18   Labs:reviewed  Meds: NS at 75 ml/hr  Height:   Ht Readings from Last 1 Encounters:  02/05/16 5\' 4"  (1.626 m)    Weight:   Wt Readings from Last 1 Encounters:  02/11/16 148 lb 5.9 oz (67.3 kg)    Ideal Body Weight:     BMI:  Body mass index is 25.46 kg/(m^2).  Estimated Nutritional Needs:   Kcal:  1650-1980 kcals/d  Protein:  79-99 g/d  Fluid:  1.6-1.9 L/d  EDUCATION NEEDS:   No education needs identified at this time  [place], [place], [place] [phone] Pager  [phone] Weekend/On-Call Pager

## 2016-02-12 LAB — BASIC METABOLIC PANEL
ANION GAP: 5 (ref 5–15)
BUN: <5 mg/dL — ABNORMAL LOW (ref 6–20)
CHLORIDE: 108 mmol/L (ref 101–111)
CO2: 26 mmol/L (ref 22–32)
Calcium: 8 mg/dL — ABNORMAL LOW (ref 8.9–10.3)
Creatinine, Ser: 0.71 mg/dL (ref 0.44–1.00)
GFR calc non Af Amer: >60 mL/min (ref >60)
GLUCOSE: 117 mg/dL — AB (ref 65–99)
Potassium: 3.3 mmol/L — ABNORMAL LOW (ref 3.5–5.1)
Sodium: 139 mmol/L (ref 135–145)

## 2016-02-12 LAB — CBC
HEMATOCRIT: 34.6 % — AB (ref 35.0–47.0)
Hemoglobin: 11.8 g/dL — ABNORMAL LOW (ref 12.0–16.0)
MCH: 32.2 pg (ref 26.0–34.0)
MCHC: 33.9 g/dL (ref 32.0–36.0)
MCV: 94.9 fL (ref 80.0–100.0)
Platelets: 73 10*3/uL — ABNORMAL LOW (ref 150–440)
RBC: 3.65 MIL/uL — ABNORMAL LOW (ref 3.80–5.20)
RDW: 13.6 % (ref 11.5–14.5)
WBC: 10.6 10*3/uL (ref 3.6–11.0)

## 2016-02-12 LAB — PROTIME-INR
INR: 1.09
PROTHROMBIN TIME: 14.3 s (ref 11.4–15.0)

## 2016-02-12 LAB — MAGNESIUM: Magnesium: 1.9 mg/dL (ref 1.7–2.4)

## 2016-02-12 MED ORDER — WARFARIN SODIUM 2.5 MG PO TABS
7.5000 mg | ORAL_TABLET | Freq: Every day | ORAL | Status: DC
  Administered 2016-02-12 – 2016-02-14 (×3): 7.5 mg via ORAL
  Filled 2016-02-12 (×2): qty 3
  Filled 2016-02-12: qty 1

## 2016-02-12 MED ORDER — OXYCODONE-ACETAMINOPHEN 5-325 MG PO TABS: 1.0000 | ORAL_TABLET | ORAL | Status: DC | PRN

## 2016-02-12 MED ORDER — POTASSIUM CHLORIDE CRYS ER 20 MEQ PO TBCR
40.0000 meq | EXTENDED_RELEASE_TABLET | Freq: Two times a day (BID) | ORAL | Status: AC
  Administered 2016-02-12: 40 meq via ORAL
  Filled 2016-02-12 (×2): qty 2

## 2016-02-12 MED ORDER — CLOPIDOGREL BISULFATE 75 MG PO TABS
75.0000 mg | ORAL_TABLET | Freq: Every day | ORAL | Status: DC
  Administered 2016-02-12 – 2016-02-15 (×4): 75 mg via ORAL
  Filled 2016-02-12 (×4): qty 1

## 2016-02-12 NOTE — Progress Notes (Signed)
[facility]  Daily Progress Note  Subjective: 2 Days Post-Op: Introduction catheter into right lower extremity 3rd order catheter placement, Contrast injection right lower extremity for distal runoff, Angiojet mechanical thrombectomy with the Omni device, Percutaneous transluminal angioplasty to 7 mm with Lutonix right femoral to below-knee bypass graft, Percutaneous transluminal angioplasty to 3 mm distal posterior tibial with Star close closure left common femoral arteriotomy.  3 Day Post-Op: Insertion of triple-lumen central venous catheter right IJ approach. Introduction catheter into right lower extremity 3rd order catheter placement, Contrast injection right lower extremity for distal runoff, Agiojet mechanical thrombectomy with the Omni device, Percutaneous transluminal angioplasty right tibioperoneal trunk with a 3 mm balloon, Infusion of TPA for thrombolysis.  Patient without complaint this AM. States her pain has greatly improved in her right lower extremity. Patient has not been out of bed ambulating yet. We dicussed starting some ambulating with assistance and PT today and its importance. Patient will not go to rehab - insisting dispo home.   Objective: Filed Vitals:   02/12/16 0400 02/12/16 0500 02/12/16 0515 02/12/16 0800  BP: 114/54  104/62 112/65  Pulse: 102  82 82  Temp:    98.1 F (36.7 C)  TempSrc:      Resp: 25  25 26   Height:      Weight:  67.5 kg (148 lb 13 oz)    SpO2: 89%  93% 90%    Intake/Output Summary (Last 24 hours) at 02/12/16 1204 Last data filed at 02/12/16 0745  Gross per 24 hour  Intake      0 ml  Output   1800 ml  Net  -1800 ml   Physical Exam: A&Ox3, NAD Neck: Right IJ inplace - no swelling or draining. CV: RRR Pulmonary: CTA Bilaterally Abdomen: Soft, Nontender, Nondistended Vascular: Left Groin: Dressing inplace. No swelling or draining noted.  Right Lower Extremity: Toes warm, palpable PT.  Extremity warm.    Laboratory: CBC    Component Value Date/Time   WBC 10.6 02/12/2016 0515   WBC 4.3 09/21/2015 0916   HGB 11.8* 02/12/2016 0515   HCT 34.6* 02/12/2016 0515   HCT 44.4 09/21/2015 0916   PLT 73* 02/12/2016 0515   PLT 222 09/21/2015 0916   BMET    Component Value Date/Time   NA 139 02/12/2016 0515   NA 139 09/21/2015 0916   K 3.3* 02/12/2016 0515   CL 108 02/12/2016 0515   CO2 26 02/12/2016 0515   GLUCOSE 117* 02/12/2016 0515   GLUCOSE 85 09/21/2015 0916   BUN <5* 02/12/2016 0515   BUN 12 09/21/2015 0916   CREATININE 0.71 02/12/2016 0515   CALCIUM 8.0* 02/12/2016 0515   GFRNONAA >60 02/12/2016 0515   GFRAA >60 02/12/2016 0515   Assessment/Planning: 65 year old female s/p right lower extremity ischemia with TPA infusion overnight and right lower extremity angiogram with intervention for successful revascularization of her extremity.  1) Started Coumadin and Plavix today for oral anticoagulation. Continue Lovenox until patient is therapeutic. 2) Ambulation and PT today 3) Transfer out of unit to surgical floor when bed is available. 4) Transitioned from IV pain control to PO today.  5) Repleted Potassium 6) Anticipate d/c home Monday or when patient is therapeutic / ambulating independently.   Marcelle Overlie PA-C 02/12/2016 12:04 PM

## 2016-02-12 NOTE — Evaluation (Signed)
Physical Therapy Evaluation Patient Details Name: Sara [name] MRN: [id number] DOB: 09-05-1950 Today's Date: 02/12/2016   History of Present Illness  Sara [name] is a 65yo white female who comes to [facility] after acute onset RLE pain/cramping: pt found to have vascular blockage and undergone vascular procedures on 6/21, 6/22. Pt reports she has been fully indep at baseline, but has had to limited ambulation distances (limited community distances) due to chronic vascular claudication problems since surgery in 1991.   Clinical Impression  Pt demonstrates functional mobility with modified independence/supervision (lines/leads), with gait speed decreased and required effort increased compared to her baseline. HR remains elevated during gait trial 110's, and RR is high as well, but related to cough avoidance. Pt has orders to AMB 3x daily with RN an should tolerate that well based on gait trial today. She has adequate social support at home. No additional needs here from PT; all PT needs can be met at next venue of care.     Follow Up Recommendations Home health PT    Equipment Recommendations  None recommended by PT    Recommendations for Other Services       Precautions / Restrictions Precautions Precautions: Fall Precaution Comments: "AMB 3x daily with meals" Restrictions Weight Bearing Restrictions: No      Mobility  Bed Mobility               General bed mobility comments: Received in chair.   Transfers Overall transfer level: Modified independent Equipment used: None                Ambulation/Gait Ambulation/Gait assistance: Supervision Ambulation Distance (Feet): 500 Feet Assistive device: None Gait Pattern/deviations: Step-through pattern Gait velocity: 0.24ms Gait velocity interpretation: <1.8 ft/sec, indicative of risk for recurrent falls General Gait Details: slow but near symmetrical.   Stairs            Wheelchair Mobility     Modified Rankin (Stroke Patients Only)       Balance Overall balance assessment: No apparent balance deficits (not formally assessed);Modified Independent                                           Pertinent Vitals/Pain Pain Assessment: 0-10 Pain Score: 4  Pain Location: R calf  Pain Descriptors / Indicators: Aching Pain Intervention(s): Limited activity within patient's tolerance;Monitored during session;Repositioned    Home Living Family/patient expects to be discharged to:: Private residence Living Arrangements: Other relatives (Sister) Available Help at Discharge: Family Type of Home: House Home Access: Stairs to enter Entrance Stairs-Rails: Right Entrance Stairs-Number of Steps: 5 Home Layout: One level Home Equipment: None      Prior Function Level of Independence: Independent   Gait / Transfers Assistance Needed: limited community distances only (428 grocery trips occasionally)           Hand Dominance        Extremity/Trunk Assessment                         Communication      Cognition Arousal/Alertness: Awake/alert Behavior During Therapy: WFL for tasks assessed/performed Overall Cognitive Status: Within Functional Limits for tasks assessed                      General Comments      Exercises  Assessment/Plan    PT Assessment All further PT needs can be met in the next venue of care  PT Diagnosis Difficulty walking;Acute pain   PT Problem List Decreased strength;Decreased range of motion;Decreased activity tolerance;Decreased mobility;Cardiopulmonary status limiting activity  PT Treatment Interventions     PT Goals (Current goals can be found in the Care Plan section) Acute Rehab PT Goals PT Goal Formulation: All assessment and education complete, DC therapy    Frequency     Barriers to discharge        Co-evaluation               End of Session Equipment Utilized During  Treatment: Gait belt Activity Tolerance: Patient tolerated treatment well;No increased pain Patient left: in chair;with call bell/phone within reach Nurse Communication: Mobility status;Other (comment)         Time: 4862-8241 PT Time Calculation (min) (ACUTE ONLY): 15 min   Charges:   PT Evaluation $PT Eval Moderate Complexity: 1 Procedure PT Treatments $Therapeutic Activity: 8-22 mins   PT G Codes:       3:21 PM, 13-Mar-2016 Etta Grandchild, PT, DPT Physical Therapist - [place] [phone] (mobile)

## 2016-02-13 LAB — CBC
HEMATOCRIT: 36.5 % (ref 35.0–47.0)
Hemoglobin: 12.3 g/dL (ref 12.0–16.0)
MCH: 31.6 pg (ref 26.0–34.0)
MCHC: 33.8 g/dL (ref 32.0–36.0)
MCV: 93.5 fL (ref 80.0–100.0)
Platelets: 88 10*3/uL — ABNORMAL LOW (ref 150–440)
RBC: 3.9 MIL/uL (ref 3.80–5.20)
RDW: 13.6 % (ref 11.5–14.5)
WBC: 13.7 10*3/uL — ABNORMAL HIGH (ref 3.6–11.0)

## 2016-02-13 LAB — BASIC METABOLIC PANEL
Anion gap: 5 (ref 5–15)
BUN: 9 mg/dL (ref 6–20)
CALCIUM: 8.7 mg/dL — AB (ref 8.9–10.3)
CO2: 25 mmol/L (ref 22–32)
CREATININE: 0.67 mg/dL (ref 0.44–1.00)
Chloride: 109 mmol/L (ref 101–111)
GFR calc non Af Amer: >60 mL/min (ref >60)
GLUCOSE: 145 mg/dL — AB (ref 65–99)
Potassium: 4.7 mmol/L (ref 3.5–5.1)
Sodium: 139 mmol/L (ref 135–145)

## 2016-02-13 LAB — PROTIME-INR
INR: 1.17
Prothrombin Time: 15.1 seconds — ABNORMAL HIGH (ref 11.4–15.0)

## 2016-02-13 LAB — MAGNESIUM: Magnesium: 2.2 mg/dL (ref 1.7–2.4)

## 2016-02-13 NOTE — Progress Notes (Signed)
[facility]  Daily Progress Note   Subjective: 3 Days Post-Op: Introduction catheter into right lower extremity 3rd order catheter placement, Contrast injection right lower extremity for distal runoff, Angiojet mechanical thrombectomy with the Omni device, Percutaneous transluminal angioplasty to 7 mm with Lutonix right femoral to below-knee bypass graft, Percutaneous transluminal angioplasty to 3 mm distal posterior tibial with Star close closure left common femoral arteriotomy.  4 Day Post-Op: Insertion of triple-lumen central venous catheter right IJ approach. Introduction catheter into right lower extremity 3rd order catheter placement, Contrast injection right lower extremity for distal runoff, Agiojet mechanical thrombectomy with the Omni device, Percutaneous transluminal angioplasty right tibioperoneal trunk with a 3 mm balloon, Infusion of TPA for thrombolysis.  Patient without complaint today. Still no pain in right lower extremity. States up and walking without "any problems". Seen by PT yesterday - no rehab recommended. INR today: 1.17  Objective: Filed Vitals:   02/13/16 0000 02/13/16 0400 02/13/16 0800 02/13/16 1200  BP: 108/59 95/54 105/64 120/68  Pulse: 90 70 82 95  Temp:   98.1 F (36.7 C) 98.5 F (36.9 C)  TempSrc:   Oral Oral  Resp: 27 21 21 25   Height:      Weight:      SpO2: 91% 93% 96% 88%   No intake or output data in the 24 hours ending 02/13/16 1345  Physical Exam: A&Ox3, NAD Neck: Right IJ inplace - no swelling or draining. CV: RRR Pulmonary: CTA Bilaterally Abdomen: Soft, Nontender, Nondistended Vascular: Left Groin: Access: No swelling or draining noted.  Right Lower Extremity: Toes warm, palpable PT. Extremity warm.    Laboratory: CBC    Component Value Date/Time   WBC 13.7* 02/13/2016 0454   WBC 4.3 09/21/2015 0916   HGB 12.3 02/13/2016 0454   HCT 36.5 02/13/2016 0454   HCT 44.4 09/21/2015 0916   PLT 88* 02/13/2016 0454   PLT 222 09/21/2015 0916   BMET    Component Value Date/Time   NA 139 02/13/2016 0454   NA 139 09/21/2015 0916   K 4.7 02/13/2016 0454   CL 109 02/13/2016 0454   CO2 25 02/13/2016 0454   GLUCOSE 145* 02/13/2016 0454   GLUCOSE 85 09/21/2015 0916   BUN 9 02/13/2016 0454   BUN 12 09/21/2015 0916   CREATININE 0.67 02/13/2016 0454   CALCIUM 8.7* 02/13/2016 0454   GFRNONAA >60 02/13/2016 0454   GFRAA >60 02/13/2016 0454   Assessment/Planning: 65 year old female s/p right lower extremity ischemia with TPA infusion overnight and right lower extremity angiogram with intervention for successful revascularization of her extremity.  1) Started Coumadin and Plavix yesterday for oral anticoagulation. Continue Lovenox until patient is therapeutic. INR today 1.15 2) Continue ambulation 3) Transfer out of unit to surgical floor when bed is available. 4) Anticipate d/c home Monday or when patient is therapeutic on coumadin  Marcelle Overlie PA-C 02/13/2016 1:45 PM

## 2016-02-13 NOTE — Progress Notes (Signed)
Alert and oriented.  Up in halls several times today.  Up in room.  Sinus tach with activity. Frequent non-productive cough.  She states she has had this cough for weeks.  She states cough improved with prednisone.  Denies any pain.  Left groin site with mild bruising. No hematoma. No sign of infection. Transferred to room 216.  Report called prior.

## 2016-02-14 LAB — PROTIME-INR
INR: 1.76
PROTHROMBIN TIME: 20.5 s — AB (ref 11.4–15.0)

## 2016-02-14 LAB — CBC
HCT: 37.5 % (ref 35.0–47.0)
HEMOGLOBIN: 12.7 g/dL (ref 12.0–16.0)
MCH: 32.2 pg (ref 26.0–34.0)
MCHC: 33.9 g/dL (ref 32.0–36.0)
MCV: 95.2 fL (ref 80.0–100.0)
PLATELETS: 116 10*3/uL — AB (ref 150–440)
RBC: 3.94 MIL/uL (ref 3.80–5.20)
RDW: 13.5 % (ref 11.5–14.5)
WBC: 16.2 10*3/uL — AB (ref 3.6–11.0)

## 2016-02-14 LAB — BASIC METABOLIC PANEL
ANION GAP: 5 (ref 5–15)
BUN: 8 mg/dL (ref 6–20)
CHLORIDE: 108 mmol/L (ref 101–111)
CO2: 27 mmol/L (ref 22–32)
Calcium: 8.5 mg/dL — ABNORMAL LOW (ref 8.9–10.3)
Creatinine, Ser: 0.74 mg/dL (ref 0.44–1.00)
GFR calc Af Amer: >60 mL/min (ref >60)
Glucose, Bld: 106 mg/dL — ABNORMAL HIGH (ref 65–99)
POTASSIUM: 3.6 mmol/L (ref 3.5–5.1)
SODIUM: 140 mmol/L (ref 135–145)

## 2016-02-14 LAB — MAGNESIUM: MAGNESIUM: 2.1 mg/dL (ref 1.7–2.4)

## 2016-02-14 NOTE — Progress Notes (Signed)
[facility]  Daily Progress Note   Subjective  - 4 Days Post-Op  Right leg pain essentially gone  States ambulating without any problem  Objective Filed Vitals:   02/13/16 2038 02/14/16 0434 02/14/16 0545 02/14/16 1307  BP: 124/65 128/66  109/61  Pulse: 92 97  86  Temp: 98.2 F (36.8 C) 98.2 F (36.8 C)  98.1 F (36.7 C)  TempSrc: Oral Oral  Oral  Resp: 14 16  17   Height:      Weight:   65.635 kg (144 lb 11.2 oz)   SpO2: 97% 96%  95%    Intake/Output Summary (Last 24 hours) at 02/14/16 1723 Last data filed at 02/14/16 0900  Gross per 24 hour  Intake    720 ml  Output      0 ml  Net    720 ml    PULM  Normal effort , no use of accessory muscles CV  No JVD, RRR Abd      No distended, nontender VASC  2+ right PT pulse  Laboratory CBC    Component Value Date/Time   WBC 16.2* 02/14/2016 0535   WBC 4.3 09/21/2015 0916   HGB 12.7 02/14/2016 0535   HCT 37.5 02/14/2016 0535   HCT 44.4 09/21/2015 0916   PLT 116* 02/14/2016 0535   PLT 222 09/21/2015 0916    BMET    Component Value Date/Time   NA 140 02/14/2016 0535   NA 139 09/21/2015 0916   K 3.6 02/14/2016 0535   CL 108 02/14/2016 0535   CO2 27 02/14/2016 0535   GLUCOSE 106* 02/14/2016 0535   GLUCOSE 85 09/21/2015 0916   BUN 8 02/14/2016 0535   BUN 12 09/21/2015 0916   CREATININE 0.74 02/14/2016 0535   CALCIUM 8.5* 02/14/2016 0535   GFRNONAA >60 02/14/2016 0535   GFRAA >60 02/14/2016 0535    Assessment/Planning: POD #4 s/p Thrombolysis of right leg   Continue Coumadin and plavix  This was discussed with the patient  Ambulate tid and PRN  [name]  02/14/2016, 5:23 PM

## 2016-02-14 NOTE — Care Management (Signed)
patient adamantly states that she is in total agreement with discharge home today and does not require any home health services.  Will be with her sister.  Will not discharge home on any cost prohibitive anti platelets/anticoagulants

## 2016-02-14 NOTE — Care Management Important Message (Signed)
Important Message  Patient Details  Name: Sara [name] MRN: [id number] Date of Birth: 10/21/1950   Medicare Important Message Given:  Yes    Katrina Stack, RN 02/14/2016, 9:33 AM

## 2016-02-15 LAB — PROTIME-INR
INR: 2.46
Prothrombin Time: 26.4 seconds — ABNORMAL HIGH (ref 11.4–15.0)

## 2016-02-15 MED ORDER — WARFARIN SODIUM 5 MG PO TABS: 5.0000 mg | ORAL_TABLET | Freq: Every day | ORAL | Status: DC

## 2016-02-15 MED ORDER — CLOPIDOGREL BISULFATE 75 MG PO TABS: 75.0000 mg | ORAL_TABLET | Freq: Every day | ORAL | Status: DC

## 2016-02-15 NOTE — Progress Notes (Signed)
Pt A and O x 4. VSS. Pt tolerating diet well. No complaints of pain or nausea. Right IJ removed intact, prescriptions given. Pt voiced understanding of discharge instructions with no further questions. Pt walked down with auxillary.

## 2016-02-15 NOTE — Discharge Summary (Signed)
[facility]    Discharge Summary    Patient ID:  Sara [name] MRN: [id number] DOB/AGE: 63-Sep-1952 63 y.o.  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 GERD (gastroesophageal reflux disease)   . Osteoporosis   . Vascular disease     Sees Dr. Delana Meyer  . Vertigo     Last episode approx Aug 2015  . Depression   . History of blood clots   . Stomach ulcer     Procedure(s): Lower Extremity Angiography  Discharged Condition: good  HPI:  The patient presented on June 17 with acute ischemia of the right leg. She has an extensive history of arterial reconstruction on that leg. This had began back in 1991 at which time she underwent attempted thrombolysis and intervention which did not work and then she had a femoropopliteal bypass with right leg great saphenous vein. This lasted until 2011 when she again became acutely ischemic and underwent a right redo femoropopliteal bypass with left great saphenous vein. She reported in the ER she had proximally 48 hours of increasing pain.  On admission she had a cool foot that was motor and sensory intact with 3 second capillary refill. Because her foot was viable and her pain was well controlled by elected to have cardiology see her. In part this was based on review of her CT angiogram done while she was in the ER which does not show any significant plaque formation anywhere with the exception of the postsurgical changes in the right leg and a chronic occlusion of the SMA. She did have cardiac echo as well as cardiac stress atrial thrombus was not identified and she has been in sinus rhythm for the entire  time she has been here. I also had GI see her an EGD was done she has an extensive history of peptic ulcer disease with bleeding ulcers of the stomach as well as erosions and bleeding from the esophagus secondary to her GERD. Once these services had cleared her I brought her to special procedures where I was able to cross the bypass and initiated thrombolysis. She was lysed for approximately 24 hours follow-up imaging demonstrated stenosis at the distal anastomosis as well as in the distal posterior tibial and these were treated with angioplasty some residual thrombus was aspirated with the AngioJet and she was placed on Aggrastat for 24 hours. Postoperatively she has done well she has been maintained on a heparin drip and subsequently bridged to Coumadin today her INR was 2.4. She has been ambulating without assistance she no longer has any pain in her right lower extremity.  Hospital Course:  Sara [name] is a 65 y.o. female is S/P Right Procedure(s): Lower Extremity Angiography Extubated: POD # 0 Physical exam: Palpable posterior tibial pulse Post-op wounds healing well Pt. Ambulating, voiding and taking PO diet without difficulty. Pt pain controlled with PO pain meds. Labs as below Complications:none  Consults:     Significant Diagnostic Studies: CBC Lab Results  Component Value Date   WBC 16.2* 02/14/2016   HGB 12.7 02/14/2016   HCT 37.5 02/14/2016   MCV 95.2 02/14/2016   PLT 116* 02/14/2016    BMET    Component Value Date/Time   NA  140 02/14/2016 0535   NA 139 09/21/2015 0916   K 3.6 02/14/2016 0535   CL 108 02/14/2016 0535   CO2 27 02/14/2016 0535   GLUCOSE 106* 02/14/2016 0535   GLUCOSE 85 09/21/2015 0916   BUN 8 02/14/2016 0535   BUN 12 09/21/2015 0916   CREATININE 0.74 02/14/2016 0535   CALCIUM 8.5* 02/14/2016 0535   GFRNONAA >60 02/14/2016 0535   GFRAA >60 02/14/2016 0535   COAG Lab Results  Component Value Date   INR 2.46 02/15/2016   INR 1.76 02/14/2016    INR 1.17 02/13/2016     Disposition:  Discharge to :Home Discharge Instructions    Call MD for:  redness, tenderness, or signs of infection (pain, swelling, bleeding, redness, odor or green/yellow discharge around incision site)    Complete by:  As directed      Call MD for:  severe or increased pain, loss or decreased feeling  in affected limb(s)    Complete by:  As directed      Call MD for:  temperature >100.5    Complete by:  As directed      Diet - low sodium heart healthy    Complete by:  As directed      Increase activity slowly    Complete by:  As directed      Resume previous diet    Complete by:  As directed             Medication List    TAKE these medications        albuterol 108 (90 Base) MCG/ACT inhaler  Commonly known as:  PROVENTIL HFA;VENTOLIN HFA  Inhale 2 puffs into the lungs every 6 (six) hours as needed for wheezing or shortness of breath.     atorvastatin 10 MG tablet  Commonly known as:  LIPITOR  Take 1 tablet (10 mg total) by mouth daily.     benzonatate 100 MG capsule  Commonly known as:  TESSALON  Take 1 capsule (100 mg total) by mouth 3 (three) times daily as needed.     buPROPion 150 MG 24 hr tablet  Commonly known as:  WELLBUTRIN XL  Take 1 tablet (150 mg total) by mouth daily.     calcium-vitamin D 500-200 MG-UNIT tablet  Take 3 tablets by mouth daily.     clobetasol cream 0.05 %  Commonly known as:  TEMOVATE  Apply 1 application topically 2 (two) times daily.     clopidogrel 75 MG tablet  Commonly known as:  PLAVIX  Take 1 tablet (75 mg total) by mouth daily.     denosumab 60 MG/ML Soln injection  Commonly known as:  PROLIA  Inject 60 mg into the skin every 6 (six) months. Administer in upper arm, thigh, or abdomen     dextromethorphan-guaiFENesin 30-600 MG 12hr tablet  Commonly known as:  MUCINEX DM  Take 1 tablet by mouth 2 (two) times daily.     Melatonin 1 MG Tabs  Take 1 tablet (1 mg total) by mouth Nightly.      multivitamin capsule  Take 1 capsule by mouth daily. PM     nitroGLYCERIN 0.4 MG SL tablet  Commonly known as:  NITROSTAT  Place 1 tablet (0.4 mg total) under the tongue every 5 (five) minutes as needed for chest pain.     pantoprazole 40 MG tablet  Commonly known as:  PROTONIX  Take 1 tablet (40 mg total) by mouth 2 (two) times daily.  predniSONE 20 MG tablet  Commonly known as:  DELTASONE  Take 2 tablets (40 mg total) by mouth daily with breakfast.     triamcinolone cream 0.1 %  Commonly known as:  KENALOG  Apply 1 application topically 2 (two) times daily.     venlafaxine XR 75 MG 24 hr capsule  Commonly known as:  EFFEXOR-XR  Take 1 capsule (75 mg total) by mouth daily with breakfast.     warfarin 5 MG tablet  Commonly known as:  COUMADIN  Take 1 tablet (5 mg total) by mouth daily at 6 PM.       Verbal and written Discharge instructions given to the patient. Wound care per Discharge AVS     Follow-up Information    Follow up with Schnier, Dolores Lory, MD In 3 days.   Specialties:  Vascular Surgery, Cardiology, Radiology, Vascular Surgery   Why:  With MA not MD for INR check   Contact information:   [place] [address] [phone]       Follow up with Schnier, Dolores Lory, MD In 3 weeks.   Specialties:  Vascular Surgery, Cardiology, Radiology, Vascular Surgery   Why:  Follow up in 3-4 weeks, c/ [place] right leg arterial   Contact information:   [place] [address] [id number]       Signed: [name], MD  02/15/2016, 4:41 PM

## 2016-02-15 NOTE — Progress Notes (Signed)
Per Dr. Delana Meyer discontinue Right IJ

## 2016-02-21 ENCOUNTER — Ambulatory Visit (INDEPENDENT_AMBULATORY_CARE_PROVIDER_SITE_OTHER): Payer: Medicare Other | Admitting: Family Medicine

## 2016-02-21 ENCOUNTER — Encounter: Payer: Self-pay | Admitting: Family Medicine

## 2016-02-21 ENCOUNTER — Ambulatory Visit
Admission: RE | Admit: 2016-02-21 | Discharge: 2016-02-21 | Disposition: A | Discharge status: Home / Self Care | Payer: Medicare Other | Source: Ambulatory Visit | Attending: Family Medicine | Admitting: Family Medicine

## 2016-02-21 VITALS — BP 133/76 | HR 100 | Temp 98.6°F | Resp 16 | Ht 64.0 in | Wt 145.0 lb

## 2016-02-21 DIAGNOSIS — I779 Disorder of arteries and arterioles, unspecified: Secondary | ICD-10-CM

## 2016-02-21 DIAGNOSIS — J40 Bronchitis, not specified as acute or chronic: Secondary | ICD-10-CM | POA: Diagnosis not present

## 2016-02-21 DIAGNOSIS — M545 Low back pain, unspecified: Secondary | ICD-10-CM

## 2016-02-21 DIAGNOSIS — R05 Cough: Secondary | ICD-10-CM

## 2016-02-21 DIAGNOSIS — Z7901 Long term (current) use of anticoagulants: Secondary | ICD-10-CM | POA: Diagnosis not present

## 2016-02-21 DIAGNOSIS — R059 Cough, unspecified: Secondary | ICD-10-CM

## 2016-02-21 MED ORDER — DM-GUAIFENESIN ER 30-600 MG PO TB12: 1.0000 | ORAL_TABLET | Freq: Two times a day (BID) | ORAL | Status: DC

## 2016-02-21 MED ORDER — OXYCODONE-ACETAMINOPHEN 10-325 MG PO TABS: 1.0000 | ORAL_TABLET | Freq: Three times a day (TID) | ORAL | Status: DC | PRN

## 2016-02-21 MED ORDER — CYCLOBENZAPRINE HCL 10 MG PO TABS: 10.0000 mg | ORAL_TABLET | Freq: Three times a day (TID) | ORAL | Status: DC | PRN

## 2016-02-21 MED ORDER — AZITHROMYCIN 250 MG PO TABS: ORAL_TABLET | ORAL | Status: DC

## 2016-02-21 NOTE — Progress Notes (Signed)
Subjective:    Patient ID: Sara [name], female    DOB: [birth year], 65 y.o.   MRN: [id number]  HPI: Sara [name] is a 65 y.o. female presenting on 02/21/2016 for Cough   HPI  Pt presents for follow-up of bronchitis. She was seen on 6/12. SYmptoms never got better. Recent hospitalization for blood clot in her graft. Is having back pain from coughing. Ribs are sore. Completed prednisone and mucinex. Chest is no longer feeling tight. Coughing has improved today. Mucinex provided mild relief. Benadryl has helped.  Low back pain- started this morning. Thinks she hurt back from coughing. R sided low back pain with radiation to groin at times. No numbness. No bowel or bladder incontinence.  Decreased ROM due to pain.   Past Medical History  Diagnosis Date  . GERD (gastroesophageal reflux disease)   . Osteoporosis   . Vascular disease     Sees Dr. Delana Meyer  . Vertigo     Last episode approx Aug 2015  . Depression   . History of blood clots   . Stomach ulcer     Current Outpatient Prescriptions on File Prior to Visit  Medication Sig  . albuterol (PROVENTIL HFA;VENTOLIN HFA) 108 (90 Base) MCG/ACT inhaler Inhale 2 puffs into the lungs every 6 (six) hours as needed for wheezing or shortness of breath.  Marland Kitchen atorvastatin (LIPITOR) 10 MG tablet Take 1 tablet (10 mg total) by mouth daily.  . benzonatate (TESSALON) 100 MG capsule Take 1 capsule (100 mg total) by mouth 3 (three) times daily as needed.  Marland Kitchen buPROPion (WELLBUTRIN XL) 150 MG 24 hr tablet Take 1 tablet (150 mg total) by mouth daily.  . Calcium Carbonate-Vitamin D (CALCIUM-VITAMIN D) 500-200 MG-UNIT tablet Take 3 tablets by mouth daily.  . clobetasol cream (TEMOVATE) [id number] % Apply 1 application topically 2 (two) times daily.  . clopidogrel (PLAVIX) 75 MG tablet Take 1 tablet (75 mg total) by mouth daily.  Marland Kitchen denosumab (PROLIA) 60 MG/ML SOLN injection Inject 60 mg into the skin every 6 (six) months. Administer in upper arm, thigh, or abdomen  .  Melatonin 1 MG TABS Take 1 tablet (1 mg total) by mouth Nightly.  . Multiple Vitamin (MULTIVITAMIN) capsule Take 1 capsule by mouth daily. PM  . nitroGLYCERIN (NITROSTAT) 0.4 MG SL tablet Place 1 tablet (0.4 mg total) under the tongue every 5 (five) minutes as needed for chest pain.  . pantoprazole (PROTONIX) 40 MG tablet Take 1 tablet (40 mg total) by mouth 2 (two) times daily.  Marland Kitchen triamcinolone cream (KENALOG) 0.1 % Apply 1 application topically 2 (two) times daily.  Marland Kitchen venlafaxine XR (EFFEXOR-XR) 75 MG 24 hr capsule Take 1 capsule (75 mg total) by mouth daily with breakfast.  . warfarin (COUMADIN) 5 MG tablet Take 1 tablet (5 mg total) by mouth daily at 6 PM.   No current facility-administered medications on file prior to visit.    Review of Systems  Constitutional: Negative for fever and chills.  HENT: Negative.  Negative for congestion, ear pain, postnasal drip and tinnitus.   Respiratory: Positive for cough and chest tightness. Negative for wheezing.   Cardiovascular: Negative for chest pain and leg swelling.  Gastrointestinal: Negative for nausea, vomiting, abdominal pain, diarrhea and constipation.  Endocrine: Negative.  Negative for cold intolerance, heat intolerance, polydipsia, polyphagia and polyuria.  Genitourinary: Negative for dysuria and difficulty urinating.  Musculoskeletal: Positive for back pain and gait problem (due to back pain).  Neurological: Negative for dizziness, light-headedness and numbness.  Psychiatric/Behavioral: Negative.    Per HPI unless specifically indicated above     Objective:    BP 133/76 mmHg  Pulse 100  Temp(Src) 98.6 F (37 C) (Oral)  Resp 16  Ht 5\' 4"  (1.626 m)  Wt 145 lb (65.772 kg)  BMI 24.88 kg/m2  SpO2 100%  Wt Readings from Last 3 Encounters:  02/21/16 145 lb (65.772 kg)  02/15/16 143 lb (64.864 kg)  01/31/16 146 lb (66.225 kg)    Physical Exam  HENT:  Head: Normocephalic and atraumatic.  Right Ear: Hearing and tympanic  membrane normal.  Left Ear: Hearing and tympanic membrane normal.  Nose: Nose normal. Right sinus exhibits no maxillary sinus tenderness and no frontal sinus tenderness. Left sinus exhibits no maxillary sinus tenderness and no frontal sinus tenderness.  Cardiovascular: Normal rate and regular rhythm.  Exam reveals no gallop and no friction rub.   No murmur heard. Pulmonary/Chest: No respiratory distress. She has no wheezes. She has no rhonchi. She has no rales. Chest wall is not dull to percussion. She exhibits no tenderness.  Coarse breath sounds.   Musculoskeletal:       Right hip: Normal. She exhibits normal range of motion, no tenderness and no bony tenderness.       Lumbar back: She exhibits decreased range of motion (due to pain. ) and tenderness. She exhibits no pain, no spasm and normal pulse.  Neurological: No cranial nerve deficit or sensory deficit.  Reflex Scores:      Patellar reflexes are 2+ on the right side and 2+ on the left side. Antalgic gait due to back pain.     Results for orders placed or performed during the hospital encounter of 02/05/16  MRSA PCR Screening  Result Value Ref Range   MRSA by PCR NEGATIVE NEGATIVE  NM Myocar Multi W/Spect W/Wall Motion / EF  Result Value Ref Range   Rest HR 93 bpm   Rest BP 121/72 mmHg   Exercise duration (min)  min   Exercise duration (sec)  sec   Estimated workload  METS   Peak HR 120 bpm   Peak BP 120/45 mmHg   MPHR  bpm   Percent HR 76 %   RPE     LV sys vol 16 mL   TID 0.50    LV dias vol 39 46 - 106 mL   LHR     SSS 6    SRS 1    SDS 0   Basic metabolic panel  Result Value Ref Range   Sodium 138 135 - 145 mmol/L   Potassium 3.6 3.5 - 5.1 mmol/L   Chloride 103 101 - 111 mmol/L   CO2 25 22 - 32 mmol/L   Glucose, Bld 106 (H) 65 - 99 mg/dL   BUN 12 6 - 20 mg/dL   Creatinine, Ser 1.16 (H) 0.44 - 1.00 mg/dL   Calcium 8.9 8.9 - 10.3 mg/dL   GFR calc non Af Amer 49 (L) >60 mL/min   GFR calc Af Amer 57 (L) >60  mL/min   Anion gap 10 5 - 15  CBC  Result Value Ref Range   WBC 12.2 (H) 3.6 - 11.0 K/uL   RBC 4.64 3.80 - 5.20 MIL/uL   Hemoglobin 14.7 12.0 - 16.0 g/dL   HCT 43.5 35.0 - 47.0 %   MCV 93.8 80.0 - 100.0 fL   MCH 31.6 26.0 - 34.0 pg   MCHC 33.7 32.0 - 36.0 g/dL   RDW  13.2 11.5 - 14.5 %   Platelets 200 150 - 440 K/uL  Protime-INR  Result Value Ref Range   Prothrombin Time 12.7 11.4 - 15.0 seconds   INR 0.93   Hepatic function panel  Result Value Ref Range   Total Protein 7.2 6.5 - 8.1 g/dL   Albumin 4.2 3.5 - 5.0 g/dL   AST 28 15 - 41 U/L   ALT 32 14 - 54 U/L   Alkaline Phosphatase 52 38 - 126 U/L   Total Bilirubin 0.1 (L) 0.3 - 1.2 mg/dL   Bilirubin, Direct <0.1 (L) 0.1 - 0.5 mg/dL   Indirect Bilirubin NOT CALCULATED 0.3 - 0.9 mg/dL  Basic metabolic panel  Result Value Ref Range   Sodium 136 135 - 145 mmol/L   Potassium 3.9 3.5 - 5.1 mmol/L   Chloride 105 101 - 111 mmol/L   CO2 23 22 - 32 mmol/L   Glucose, Bld 104 (H) 65 - 99 mg/dL   BUN 11 6 - 20 mg/dL   Creatinine, Ser 0.96 0.44 - 1.00 mg/dL   Calcium 8.7 (L) 8.9 - 10.3 mg/dL   GFR calc non Af Amer >60 >60 mL/min   GFR calc Af Amer >60 >60 mL/min   Anion gap 8 5 - 15  CBC  Result Value Ref Range   WBC 10.9 3.6 - 11.0 K/uL   RBC 4.48 3.80 - 5.20 MIL/uL   Hemoglobin 14.0 12.0 - 16.0 g/dL   HCT 41.9 35.0 - 47.0 %   MCV 93.7 80.0 - 100.0 fL   MCH 31.2 26.0 - 34.0 pg   MCHC 33.3 32.0 - 36.0 g/dL   RDW 13.4 11.5 - 14.5 %   Platelets 185 150 - 440 K/uL  APTT  Result Value Ref Range   aPTT >160 (HH) 24 - 36 seconds  Heparin level (unfractionated)  Result Value Ref Range   Heparin Unfractionated 1.22 (H) 0.30 - 0.70 IU/mL  Heparin level (unfractionated)  Result Value Ref Range   Heparin Unfractionated 0.72 (H) 0.30 - 0.70 IU/mL  CBC  Result Value Ref Range   WBC 10.7 3.6 - 11.0 K/uL   RBC 4.54 3.80 - 5.20 MIL/uL   Hemoglobin 14.2 12.0 - 16.0 g/dL   HCT 42.8 35.0 - 47.0 %   MCV 94.2 80.0 - 100.0 fL   MCH 31.2  26.0 - 34.0 pg   MCHC 33.1 32.0 - 36.0 g/dL   RDW 13.7 11.5 - 14.5 %   Platelets 190 150 - 440 K/uL  Heparin level (unfractionated)  Result Value Ref Range   Heparin Unfractionated 0.65 0.30 - 0.70 IU/mL  CBC  Result Value Ref Range   WBC 12.6 (H) 3.6 - 11.0 K/uL   RBC 4.37 3.80 - 5.20 MIL/uL   Hemoglobin 13.9 12.0 - 16.0 g/dL   HCT 41.4 35.0 - 47.0 %   MCV 94.8 80.0 - 100.0 fL   MCH 31.7 26.0 - 34.0 pg   MCHC 33.4 32.0 - 36.0 g/dL   RDW 13.5 11.5 - 14.5 %   Platelets 190 150 - 440 K/uL  Heparin level (unfractionated)  Result Value Ref Range   Heparin Unfractionated 0.61 0.30 - 0.70 IU/mL  Heparin level (unfractionated)  Result Value Ref Range   Heparin Unfractionated 0.54 0.30 - 0.70 IU/mL  Troponin I  Result Value Ref Range   Troponin I <0.03 <0.031 ng/mL  Heparin level (unfractionated)  Result Value Ref Range   Heparin Unfractionated 0.58 0.30 - 0.70 IU/mL  CBC  Result Value Ref Range   WBC 10.4 3.6 - 11.0 K/uL   RBC 4.36 3.80 - 5.20 MIL/uL   Hemoglobin 14.1 12.0 - 16.0 g/dL   HCT 41.8 35.0 - 47.0 %   MCV 96.0 80.0 - 100.0 fL   MCH 32.3 26.0 - 34.0 pg   MCHC 33.7 32.0 - 36.0 g/dL   RDW 13.5 11.5 - 14.5 %   Platelets 184 150 - 440 K/uL  Basic metabolic panel  Result Value Ref Range   Sodium 138 135 - 145 mmol/L   Potassium 4.5 3.5 - 5.1 mmol/L   Chloride 105 101 - 111 mmol/L   CO2 26 22 - 32 mmol/L   Glucose, Bld 107 (H) 65 - 99 mg/dL   BUN 12 6 - 20 mg/dL   Creatinine, Ser 0.97 0.44 - 1.00 mg/dL   Calcium 8.7 (L) 8.9 - 10.3 mg/dL   GFR calc non Af Amer >60 >60 mL/min   GFR calc Af Amer >60 >60 mL/min   Anion gap 7 5 - 15  Magnesium  Result Value Ref Range   Magnesium 2.1 1.7 - 2.4 mg/dL  Heparin level (unfractionated)  Result Value Ref Range   Heparin Unfractionated 0.51 0.30 - 0.70 IU/mL  CBC  Result Value Ref Range   WBC 9.4 3.6 - 11.0 K/uL   RBC 4.15 3.80 - 5.20 MIL/uL   Hemoglobin 13.3 12.0 - 16.0 g/dL   HCT 39.1 35.0 - 47.0 %   MCV 94.1 80.0 -  100.0 fL   MCH 32.0 26.0 - 34.0 pg   MCHC 34.0 32.0 - 36.0 g/dL   RDW 13.4 11.5 - 14.5 %   Platelets 162 150 - 440 K/uL  Basic metabolic panel  Result Value Ref Range   Sodium 136 135 - 145 mmol/L   Potassium 4.2 3.5 - 5.1 mmol/L   Chloride 105 101 - 111 mmol/L   CO2 24 22 - 32 mmol/L   Glucose, Bld 112 (H) 65 - 99 mg/dL   BUN 13 6 - 20 mg/dL   Creatinine, Ser 0.87 0.44 - 1.00 mg/dL   Calcium 8.6 (L) 8.9 - 10.3 mg/dL   GFR calc non Af Amer >60 >60 mL/min   GFR calc Af Amer >60 >60 mL/min   Anion gap 7 5 - 15  Magnesium  Result Value Ref Range   Magnesium 2.1 1.7 - 2.4 mg/dL  Heparin level (unfractionated) every 6 hours x 4 post-procedure  Result Value Ref Range   Heparin Unfractionated 0.38 0.30 - 0.70 IU/mL  Heparin level (unfractionated) every 6 hours x 4 post-procedure  Result Value Ref Range   Heparin Unfractionated 0.36 0.30 - 0.70 IU/mL  Heparin level (unfractionated) every 6 hours x 4 post-procedure  Result Value Ref Range   Heparin Unfractionated 0.35 0.30 - 0.70 IU/mL  CBC every 6 hours x 4 post-procedure  Result Value Ref Range   WBC 11.6 (H) 3.6 - 11.0 K/uL   RBC 4.21 3.80 - 5.20 MIL/uL   Hemoglobin 13.4 12.0 - 16.0 g/dL   HCT 40.0 35.0 - 47.0 %   MCV 95.1 80.0 - 100.0 fL   MCH 31.9 26.0 - 34.0 pg   MCHC 33.6 32.0 - 36.0 g/dL   RDW 13.5 11.5 - 14.5 %   Platelets 145 (L) 150 - 440 K/uL  CBC every 6 hours x 4 post-procedure  Result Value Ref Range   WBC 13.9 (H) 3.6 - 11.0 K/uL   RBC 4.16 3.80 -  5.20 MIL/uL   Hemoglobin 13.1 12.0 - 16.0 g/dL   HCT 40.3 35.0 - 47.0 %   MCV 96.8 80.0 - 100.0 fL   MCH 31.5 26.0 - 34.0 pg   MCHC 32.6 32.0 - 36.0 g/dL   RDW 13.5 11.5 - 14.5 %   Platelets 113 (L) 150 - 440 K/uL  CBC every 6 hours x 4 post-procedure  Result Value Ref Range   WBC 11.9 (H) 3.6 - 11.0 K/uL   RBC 3.92 3.80 - 5.20 MIL/uL   Hemoglobin 12.4 12.0 - 16.0 g/dL   HCT 37.5 35.0 - 47.0 %   MCV 95.6 80.0 - 100.0 fL   MCH 31.6 26.0 - 34.0 pg   MCHC 33.0  32.0 - 36.0 g/dL   RDW 13.2 11.5 - 14.5 %   Platelets 103 (L) 150 - 440 K/uL  Fibrinogen every 6 hours x 4 post-procedure  Result Value Ref Range   Fibrinogen 381 210 - 470 mg/dL  Fibrinogen every 6 hours x 4 post-procedure  Result Value Ref Range   Fibrinogen 186 (L) 210 - 470 mg/dL  Fibrinogen every 6 hours x 4 post-procedure  Result Value Ref Range   Fibrinogen 161 (L) 210 - 470 mg/dL  Glucose, capillary  Result Value Ref Range   Glucose-Capillary 77 65 - 99 mg/dL   Comment 1 Notify RN   Basic metabolic panel  Result Value Ref Range   Sodium 138 135 - 145 mmol/L   Potassium 4.3 3.5 - 5.1 mmol/L   Chloride 110 101 - 111 mmol/L   CO2 23 22 - 32 mmol/L   Glucose, Bld 116 (H) 65 - 99 mg/dL   BUN 10 6 - 20 mg/dL   Creatinine, Ser 0.67 0.44 - 1.00 mg/dL   Calcium 7.6 (L) 8.9 - 10.3 mg/dL   GFR calc non Af Amer >60 >60 mL/min   GFR calc Af Amer >60 >60 mL/min   Anion gap 5 5 - 15  CBC  Result Value Ref Range   WBC 10.6 3.6 - 11.0 K/uL   RBC 3.65 (L) 3.80 - 5.20 MIL/uL   Hemoglobin 11.8 (L) 12.0 - 16.0 g/dL   HCT 34.6 (L) 35.0 - 47.0 %   MCV 94.9 80.0 - 100.0 fL   MCH 32.2 26.0 - 34.0 pg   MCHC 33.9 32.0 - 36.0 g/dL   RDW 13.6 11.5 - 14.5 %   Platelets 73 (L) 150 - 440 K/uL  Basic metabolic panel  Result Value Ref Range   Sodium 139 135 - 145 mmol/L   Potassium 3.3 (L) 3.5 - 5.1 mmol/L   Chloride 108 101 - 111 mmol/L   CO2 26 22 - 32 mmol/L   Glucose, Bld 117 (H) 65 - 99 mg/dL   BUN <5 (L) 6 - 20 mg/dL   Creatinine, Ser 0.71 0.44 - 1.00 mg/dL   Calcium 8.0 (L) 8.9 - 10.3 mg/dL   GFR calc non Af Amer >60 >60 mL/min   GFR calc Af Amer >60 >60 mL/min   Anion gap 5 5 - 15  Magnesium  Result Value Ref Range   Magnesium 1.9 1.7 - 2.4 mg/dL  Protime-INR  Result Value Ref Range   Prothrombin Time 14.3 11.4 - 15.0 seconds   INR 1.09   CBC  Result Value Ref Range   WBC 13.7 (H) 3.6 - 11.0 K/uL   RBC 3.90 3.80 - 5.20 MIL/uL   Hemoglobin 12.3 12.0 - 16.0 g/dL   HCT  36.5 35.0 - 47.0 %   MCV 93.5 80.0 - 100.0 fL   MCH 31.6 26.0 - 34.0 pg   MCHC 33.8 32.0 - 36.0 g/dL   RDW 13.6 11.5 - 14.5 %   Platelets 88 (L) 150 - 440 K/uL  Basic metabolic panel  Result Value Ref Range   Sodium 139 135 - 145 mmol/L   Potassium 4.7 3.5 - 5.1 mmol/L   Chloride 109 101 - 111 mmol/L   CO2 25 22 - 32 mmol/L   Glucose, Bld 145 (H) 65 - 99 mg/dL   BUN 9 6 - 20 mg/dL   Creatinine, Ser 0.67 0.44 - 1.00 mg/dL   Calcium 8.7 (L) 8.9 - 10.3 mg/dL   GFR calc non Af Amer >60 >60 mL/min   GFR calc Af Amer >60 >60 mL/min   Anion gap 5 5 - 15  Magnesium  Result Value Ref Range   Magnesium 2.2 1.7 - 2.4 mg/dL  Protime-INR  Result Value Ref Range   Prothrombin Time 15.1 (H) 11.4 - 15.0 seconds   INR 1.17   Protime-INR  Result Value Ref Range   Prothrombin Time 20.5 (H) 11.4 - 15.0 seconds   INR 1.76   CBC  Result Value Ref Range   WBC 16.2 (H) 3.6 - 11.0 K/uL   RBC 3.94 3.80 - 5.20 MIL/uL   Hemoglobin 12.7 12.0 - 16.0 g/dL   HCT 37.5 35.0 - 47.0 %   MCV 95.2 80.0 - 100.0 fL   MCH 32.2 26.0 - 34.0 pg   MCHC 33.9 32.0 - 36.0 g/dL   RDW 13.5 11.5 - 14.5 %   Platelets 116 (L) 150 - 440 K/uL  Basic metabolic panel  Result Value Ref Range   Sodium 140 135 - 145 mmol/L   Potassium 3.6 3.5 - 5.1 mmol/L   Chloride 108 101 - 111 mmol/L   CO2 27 22 - 32 mmol/L   Glucose, Bld 106 (H) 65 - 99 mg/dL   BUN 8 6 - 20 mg/dL   Creatinine, Ser 0.74 0.44 - 1.00 mg/dL   Calcium 8.5 (L) 8.9 - 10.3 mg/dL   GFR calc non Af Amer >60 >60 mL/min   GFR calc Af Amer >60 >60 mL/min   Anion gap 5 5 - 15  Magnesium  Result Value Ref Range   Magnesium 2.1 1.7 - 2.4 mg/dL  Protime-INR  Result Value Ref Range   Prothrombin Time 26.4 (H) 11.4 - 15.0 seconds   INR 2.46   ECHO COMPLETE  Result Value Ref Range   Weight 2409.6 oz   Height 64 in   BP 106/85 mmHg   LV PW d 9.6 (A) 0.6 - 1.1 mm   FS 29 28 - 44 %   LA vol 28.2 mL   Ao-asc 34 cm   LA ID, A-P, ES 31 mm   IVS/LV PW RATIO, ED  1.07    AV pk vel 113 cm/s   AV Area VTI 2.01 cm2   MV Annulus VTI 23.9 cm   LA diam index 1.79 cm/m2   LA vol A4C 28.5 ml   Mean grad 2 mmHg   Area-P 1/2 2.89 cm2   E decel time 197 msec   LVOT diameter 18 mm   LVOT area 2.54 cm2   LVOT peak vel 89.2 cm/s   Ao pk vel .79 m/s   Peak grad 5 mmHg   MV pk E vel 67.4 m/s   P 1/2 time  76 ms   MV pk A vel 81 m/s   LA vol index 16.3 mL/m2   AV peak Index 1.16    MV M vel 58.1    MV Dec 197    LA diam end sys 31 mm      Assessment & Plan:   Problem List Items Addressed This Visit      Cardiovascular and Mediastinum   Peripheral arterial occlusive disease ([place])    S/p TPA and graft repair due to recent clot. Remains on warfarin to prevent clots. Following with [facility].         Other   Anticoagulated on warfarin    Reviewed changes to INR that can occur with Zpak. Pt will have INR checked in Friday. Will alert her vein and vascular provider to new medication. Reviewed bleeding risks and when to seek attention.        Other Visit Diagnoses    Cough    -  Primary    Treat with mucinex. Likley post viral but will cover with Zpak given recent hospitalization.     Relevant Medications    azithromycin (ZITHROMAX) 250 MG tablet    Other Relevant Orders    DG Chest 2 View (Completed)    Right-sided low back pain without sciatica        Treat symptomatically. Flexeril and percocet for severe pain (pt can take despite norco allergy). Supportive care. Alarm symptoms reviewed.     Relevant Medications    cyclobenzaprine (FLEXERIL) 10 MG tablet    oxyCODONE-acetaminophen (PERCOCET) 10-325 MG tablet    Bronchitis        Treat for bronchitis. No Abx indication. Prednisone and albuterol. Supportive care. Alarm symptoms reviewed. Return if not improving.     Relevant Medications    dextromethorphan-guaiFENesin (MUCINEX DM) 30-600 MG 12hr tablet       Meds ordered this encounter  Medications  . cyclobenzaprine (FLEXERIL) 10  MG tablet    Sig: Take 1 tablet (10 mg total) by mouth 3 (three) times daily as needed for muscle spasms.    Dispense:  30 tablet    Refill:  0    Order Specific Question:  Supervising Provider    Answer:  Arlis Porta [phone]  . azithromycin (ZITHROMAX) 250 MG tablet    Sig: Take 2 pills by mouth today then 1 pill by mouth daily until bottle empty.    Dispense:  6 tablet    Refill:  0    Order Specific Question:  Supervising Provider    Answer:  Arlis Porta [id number]  . dextromethorphan-guaiFENesin (MUCINEX DM) 30-600 MG 12hr tablet    Sig: Take 1 tablet by mouth 2 (two) times daily.    Dispense:  20 tablet    Refill:  0    Order Specific Question:  Supervising Provider    Answer:  Arlis Porta [phone]  . oxyCODONE-acetaminophen (PERCOCET) 10-325 MG tablet    Sig: Take 1 tablet by mouth every 8 (eight) hours as needed for pain.    Dispense:  10 tablet    Refill:  0    Order Specific Question:  Supervising Provider    Answer:  Arlis Porta [id number]      Follow up plan: Return if symptoms worsen or fail to improve.

## 2016-02-21 NOTE — Assessment & Plan Note (Signed)
Reviewed changes to INR that can occur with Zpak. Pt will have INR checked in Friday. Will alert her vein and vascular provider to new medication. Reviewed bleeding risks and when to seek attention.

## 2016-02-21 NOTE — Assessment & Plan Note (Signed)
S/p TPA and graft repair due to recent clot. Remains on warfarin to prevent clots. Following with [facility].

## 2016-02-21 NOTE — Patient Instructions (Signed)
Cough: No pneumonia today. We will treat with antibiotics to ensure it gets better. This will cause a change in your INR so be sure to get your blood checked on Friday. Use mucinex twice daily as needed for cough.  Please seek immediate medical attention if you develop shortness of breath not relieve by inhaler, chest pain/tightness, fever > 103 F or other concerning symptoms.   Back pain: Use flexeril as needed up to 3 times per day for back pain. This may make you sleepy so take at bedtime or when you are not driving.  You can take OTC tylenol as needed. You can take percocet for severe pain. Please note that percocet has tylenol in it- so if you are taking tylenol please be aware.  Percocet may make you sleepy- do not mix with flexeril and do not drive while taking this medication.  If you develop progressive numbness, weakness, loss of feeling in your pelvis, or loss of bowel/bladder control please seek immediate medical attention.

## 2016-02-22 ENCOUNTER — Other Ambulatory Visit: Payer: Self-pay | Admitting: Family Medicine

## 2016-02-24 ENCOUNTER — Ambulatory Visit (INDEPENDENT_AMBULATORY_CARE_PROVIDER_SITE_OTHER): Payer: Medicare Other | Admitting: Cardiology

## 2016-02-24 ENCOUNTER — Encounter: Payer: Self-pay | Admitting: Cardiology

## 2016-02-24 VITALS — BP 116/70 | HR 100 | Ht 64.0 in | Wt 144.0 lb

## 2016-02-24 DIAGNOSIS — Z8249 Family history of ischemic heart disease and other diseases of the circulatory system: Secondary | ICD-10-CM | POA: Diagnosis not present

## 2016-02-24 DIAGNOSIS — I739 Peripheral vascular disease, unspecified: Secondary | ICD-10-CM

## 2016-02-24 DIAGNOSIS — E785 Hyperlipidemia, unspecified: Secondary | ICD-10-CM | POA: Diagnosis not present

## 2016-02-24 NOTE — Progress Notes (Signed)
Cardiology Office Note   Date:  02/24/2016   ID:  Sara [name], DOB 1951/07/15, MRN [id number]  Referring Doctor:  Leata Mouse, NP   Cardiologist:   Wende Bushy, MD   Reason for consultation:  Follow-up after testing History of PVD and chest pain.    History of Present Illness: Sara [name] is a 65 y.o. female who presents for follow-up after tests.  Patient was recently in the hospital for acute ischemia of the right lower extremity. She underwent thrombectomy and angioplasty care of vascular surgery. During that time in the hospital, she underwent stress testing which did not reveal any ischemia.  Currently, she has had no recurrence of chest pains. No shortness of breath. She denies shortness breath, palpitations. No fever, cough, colds, abdominal pain. No PND, orthopnea. She has occasional leg edema after having bypass in her legs.   ROS:  Please see the history of present illness. Aside from mentioned under HPI, all other systems are reviewed and negative.     Past Medical History  Diagnosis Date  . GERD (gastroesophageal reflux disease)   . Osteoporosis   . Vascular disease     Sees Dr. Delana Meyer  . Vertigo     Last episode approx Aug 2015  . Depression   . History of blood clots   . Stomach ulcer     Past Surgical History  Procedure Laterality Date  . Vascular surgery  [id number]    Fem-Pop Bypass  . Hemorroidectomy  2014  . Esophagogastroduodenoscopy N/A 12/21/2014    Procedure: ESOPHAGOGASTRODUODENOSCOPY (EGD);  Surgeon: Lucilla Lame, MD;  Location: [facility];  Service: Gastroenterology;  Laterality: N/A;  . Tonsilectomy, adenoidectomy, bilateral myringotomy and tubes  1968  . Esophagogastroduodenoscopy (egd) with propofol N/A 02/08/2016    Procedure: ESOPHAGOGASTRODUODENOSCOPY (EGD) WITH PROPOFOL;  Surgeon: Lucilla Lame, MD;  Location: [facility];  Service: Endoscopy;  Laterality: N/A;  . Peripheral vascular catheterization N/A 02/09/2016   Procedure: Abdominal Aortogram w/Lower Extremity;  Surgeon: [name], MD;  Location: [facility];  Service: Cardiovascular;  Laterality: N/A;  . Peripheral vascular catheterization Right 02/10/2016    Procedure: Lower Extremity Angiography;  Surgeon: [name], MD;  Location: [facility];  Service: Cardiovascular;  Laterality: Right;     reports that she quit smoking about 24 years ago. Her smoking use included Cigarettes. She has a 50 pack-year smoking history. She has never used smokeless tobacco. She reports that she does not drink alcohol or use illicit drugs.   family history includes CVA in her mother; Cancer in her father; Colon cancer in her father; Heart attack in her mother; Heart disease in her maternal uncle. Premature CAD in the family  Current Outpatient Prescriptions  Medication Sig Dispense Refill  . albuterol (PROVENTIL HFA;VENTOLIN HFA) 108 (90 Base) MCG/ACT inhaler Inhale 2 puffs into the lungs every 6 (six) hours as needed for wheezing or shortness of breath. 1 Inhaler 0  . atorvastatin (LIPITOR) 10 MG tablet Take 1 tablet by mouth  daily 90 tablet 3  . azithromycin (ZITHROMAX) 250 MG tablet Take 2 pills by mouth today then 1 pill by mouth daily until bottle empty. 6 tablet 0  . benzonatate (TESSALON) 100 MG capsule Take 1 capsule (100 mg total) by mouth 3 (three) times daily as needed. 30 capsule 0  . buPROPion (WELLBUTRIN XL) 150 MG 24 hr tablet Take 1 tablet (150 mg total) by mouth daily. 90 tablet 3  . Calcium  Carbonate-Vitamin D (CALCIUM-VITAMIN D) 500-200 MG-UNIT tablet Take 3 tablets by mouth daily.    . clobetasol cream (TEMOVATE) [id number] % Apply 1 application topically 2 (two) times daily. 30 g 0  . clopidogrel (PLAVIX) 75 MG tablet Take 1 tablet (75 mg total) by mouth daily. 30 tablet 11  . cyclobenzaprine (FLEXERIL) 10 MG tablet Take 1 tablet (10 mg total) by mouth 3 (three) times daily as needed for muscle spasms. 30 tablet 0  .  denosumab (PROLIA) 60 MG/ML SOLN injection Inject 60 mg into the skin every 6 (six) months. Administer in upper arm, thigh, or abdomen 1 Syringe 2  . dextromethorphan-guaiFENesin (MUCINEX DM) 30-600 MG 12hr tablet Take 1 tablet by mouth 2 (two) times daily. 20 tablet 0  . Melatonin 1 MG TABS Take 1 tablet (1 mg total) by mouth Nightly.    . Multiple Vitamin (MULTIVITAMIN) capsule Take 1 capsule by mouth daily. PM    . nitroGLYCERIN (NITROSTAT) 0.4 MG SL tablet Place 1 tablet (0.4 mg total) under the tongue every 5 (five) minutes as needed for chest pain. 25 tablet 6  . oxyCODONE-acetaminophen (PERCOCET) 10-325 MG tablet Take 1 tablet by mouth every 8 (eight) hours as needed for pain. 10 tablet 0  . pantoprazole (PROTONIX) 40 MG tablet Take 1 tablet (40 mg total) by mouth 2 (two) times daily. 180 tablet 3  . triamcinolone cream (KENALOG) 0.1 % Apply 1 application topically 2 (two) times daily. 30 g 2  . venlafaxine XR (EFFEXOR-XR) 75 MG 24 hr capsule Take 1 capsule (75 mg total) by mouth daily with breakfast. 90 capsule 3  . warfarin (COUMADIN) 5 MG tablet Take 1 tablet (5 mg total) by mouth daily at 6 PM. 30 tablet 11   No current facility-administered medications for this visit.    Allergies: Amoxicillin and Vicodin    PHYSICAL EXAM: VS:  BP 116/70 mmHg  Pulse 100  Ht 5\' 4"  (1.626 m)  Wt 144 lb (65.318 kg)  BMI 24.71 kg/m2 , Body mass index is 24.71 kg/(m^2). Wt Readings from Last 3 Encounters:  02/24/16 144 lb (65.318 kg)  02/21/16 145 lb (65.772 kg)  02/15/16 143 lb (64.864 kg)    GENERAL:  well developed, well nourished, obese, not in acute distress HEENT: normocephalic, pink conjunctivae, anicteric sclerae, no xanthelasma, normal dentition, oropharynx clear NECK:  no neck vein engorgement, JVP normal, no hepatojugular reflux, carotid upstroke brisk and symmetric, no bruit, no thyromegaly, no lymphadenopathy LUNGS:  good respiratory effort, clear to auscultation bilaterally CV:   PMI not displaced, no thrills, no lifts, S1 and S2 within normal limits, no palpable S3 or S4, no murmurs, no rubs, no gallops ABD:  Soft, nontender, nondistended, normoactive bowel sounds, no abdominal aortic bruit, no hepatomegaly, no splenomegaly MS: nontender back, no kyphosis, no scoliosis, no joint deformities EXT:  2+ DP/PT pulses, no edema, no varicosities, no cyanosis, no clubbing SKIN: warm, nondiaphoretic, normal turgor, no ulcers NEUROPSYCH: alert, oriented to person, place, and time, sensory/motor grossly intact, normal mood, appropriate affect  Recent Labs: 02/05/2016: ALT 32 02/14/2016: BUN 8; Creatinine, Ser 0.74; Hemoglobin 12.7; Magnesium 2.1; Platelets 116*; Potassium 3.6; Sodium 140   Lipid Panel    Component Value Date/Time   CHOL 164 09/21/2015 0916   TRIG 57 09/21/2015 0916   HDL 85 09/21/2015 0916   CHOLHDL 1.9 09/21/2015 0916   LDLCALC 68 09/21/2015 0916     Other studies Reviewed:  EKG:  The ekg from 01/27/2016 was personally reviewed by  me and it revealed sinus rhythm, 92 BPM  Additional studies/ records that were reviewed personally reviewed by me today include:  Echo 02/06/2016: Left ventricle: The cavity size was normal. Wall thickness was  normal. Systolic function was normal. The estimated ejection  fraction was in the range of 60% to 65%. Wall motion was normal;  there were no regional wall motion abnormalities. Doppler  parameters are consistent with abnormal left ventricular  relaxation (grade 1 diastolic dysfunction). - Mitral valve: There was mild regurgitation. - Right ventricle: Systolic function was normal. - Pulmonary arteries: Systolic pressure was within the normal  range.  Nuclear stress is 02/07/2016:  No T wave inversion was noted during stress.  There was no ST segment deviation noted during stress.  The study is normal.  This is a low risk study.  The left ventricular ejection fraction is normal  (55-65%).  ASSESSMENT AND PLAN: Chest pain, concerning for angina. Multiple risk factors for CAD including peripheral vascular disease, carotid artery disease, age, previous smoking history, family history of premature CAD Echo revealed normal LVEF. Nuclear stress test did not reveal any evidence of ischemia. Low likelihood of clinically significant CAD based on this study. Patient reassured. Patient is on medical therapy anyway for PVD. Continue with clopidogrel, statin therapy. Patient tolerates clopidogrel better than aspirin.  Hyperlipidemia LDL goal is less than 70  PVD Carotid artery disease Patient follows up with vascular surgery 02/10/2016:        1. Introduction catheter into right lower extremity 3rd order catheter placement  2. Contrast injection right lower extremity for distal runoff  3. Angiojet mechanical thrombectomy with the Omni device 4. Percutaneous transluminal angioplasty right tibioperoneal trunk with a 3 mm balloon  5. Infusion of TPA for thrombolysis   Occluded IMA/SMA Evidence of arterial thrombosis No arrhythmia noted while she was in the hospital. Recommend Event monitor.  Family history of premature CAD  Current medicines are reviewed at length with the patient today.  The patient does not have concerns regarding medicines.  Labs/ tests ordered today include:  No orders of the defined types were placed in this encounter.    I had a lengthy and detailed discussion with the patient regarding diagnoses, prognosis, diagnostic options, treatment options  and side effects of medications.   I counseled the patient on importance of lifestyle modification including heart healthy diet, regular physical activity   I spent at least 40 minutes with the patient today and more than 50% of the time was spent counseling the patient and coordinating care.    Disposition:   FU with undersigned  after tests   Signed, Wende Bushy, MD  02/24/2016 10:13 AM    [place]

## 2016-02-24 NOTE — Patient Instructions (Addendum)
Medication Instructions:  Your physician recommends that you continue on your current medications as directed. Please refer to the Current Medication list given to you today.   Labwork: None ordered  Testing/Procedures: Your physician has recommended that you wear an event monitor. Event monitors are medical devices that record the heart's electrical activity. Doctors most often [place] these monitors to diagnose arrhythmias. Arrhythmias are problems with the speed or rhythm of the heartbeat. The monitor is a small, portable device. You can wear one while you do your normal daily activities. This is usually used to diagnose what is causing palpitations/syncope (passing out).   Follow-Up: Your physician recommends that you schedule a follow-up appointment in: 3 months with Dr. Yvone Neu  It was a pleasure seeing you today here in the office. Please do not hesitate to give [place] a call back if you have any further questions. [place], BSN      Any Other Special Instructions Will Be Listed Below (If Applicable).     If you need a refill on your cardiac medications before your next appointment, please call your pharmacy.  Cardiac Event Monitoring A cardiac event monitor is a small recording device used to help detect abnormal heart rhythms (arrhythmias). The monitor is used to record heart rhythm when noticeable symptoms such as the following occur:  Fast heartbeats (palpitations), such as heart racing or fluttering.  Dizziness.  Fainting or light-headedness.  Unexplained weakness. The monitor is wired to two electrodes placed on your chest. Electrodes are flat, sticky disks that attach to your skin. The monitor can be worn for up to 30 days. You will wear the monitor at all times, except when bathing.  HOW TO USE YOUR CARDIAC EVENT MONITOR A technician will prepare your chest for the electrode placement. The technician will show you how to place the electrodes, how to work  the monitor, and how to replace the batteries. Take time to practice using the monitor before you leave the office. Make sure you understand how to send the information from the monitor to your health care provider. This requires a telephone with a landline, not a cell phone. You need to:  Wear your monitor at all times, except when you are in water:  Do not get the monitor wet.  Take the monitor off when bathing. Do not swim or use a hot tub with it on.  Keep your skin clean. Do not put body lotion or moisturizer on your chest.  Change the electrodes daily or any time they stop sticking to your skin. You might need to use tape to keep them on.  It is possible that your skin under the electrodes could become irritated. To keep this from happening, try to put the electrodes in slightly different places on your chest. However, they must remain in the area under your left breast and in the upper right section of your chest.  Make sure the monitor is safely clipped to your clothing or in a location close to your body that your health care provider recommends.  Press the button to record when you feel symptoms of heart trouble, such as dizziness, weakness, light-headedness, palpitations, thumping, shortness of breath, unexplained weakness, or a fluttering or racing heart. The monitor is always on and records what happened slightly before you pressed the button, so do not worry about being too late to get good information.  Keep a diary of your activities, such as walking, doing chores, and taking medicine. It is  especially important to note what you were doing when you pushed the button to record your symptoms. This will help your health care provider determine what might be contributing to your symptoms. The information stored in your monitor will be reviewed by your health care provider alongside your diary entries.  Send the recorded information as recommended by your health care provider. It is  important to understand that it will take some time for your health care provider to process the results.  Change the batteries as recommended by your health care provider. SEEK IMMEDIATE MEDICAL CARE IF:   You have chest pain.  You have extreme difficulty breathing or shortness of breath.  You develop a very fast heartbeat that persists.  You develop dizziness that does not go away.  You faint or constantly feel you are about to faint.   This information is not intended to replace advice given to you by your health care provider. Make sure you discuss any questions you have with your health care provider.   Document Released: 05/16/2008 Document Revised: 08/28/2014 Document Reviewed: 02/03/2013 Elsevier Interactive Patient Education [employer].

## 2016-02-29 ENCOUNTER — Ambulatory Visit (INDEPENDENT_AMBULATORY_CARE_PROVIDER_SITE_OTHER): Payer: Medicare Other | Admitting: Family Medicine

## 2016-02-29 DIAGNOSIS — M545 Low back pain, unspecified: Secondary | ICD-10-CM

## 2016-02-29 MED ORDER — OXYCODONE-ACETAMINOPHEN 10-325 MG PO TABS: 1.0000 | ORAL_TABLET | Freq: Three times a day (TID) | ORAL | Status: DC | PRN

## 2016-02-29 MED ORDER — LIDO-CAPSAICIN-MEN-METHYL SAL 0.5-0.035-5-20 % EX PTCH: 1.0000 | MEDICATED_PATCH | Freq: Every day | CUTANEOUS | Status: DC

## 2016-02-29 MED ORDER — CYCLOBENZAPRINE HCL 10 MG PO TABS: 10.0000 mg | ORAL_TABLET | Freq: Three times a day (TID) | ORAL | Status: DC | PRN

## 2016-02-29 NOTE — Patient Instructions (Signed)
Back pain: Continue to wear the brace. Apply OTC lidocaine patches to help with pain to most painful spot. Please let me know if symptoms don't improve- out next steps will be to try PT or see a back specialist.   If you develop progressive numbness, weakness, loss of feeling in your pelvis, or loss of bowel/bladder control please seek immediate medical attention.   Also- if you develop s/s of bleeding- (bleeding gums, bruising, rectal bleeding), severe pain, or trouble breathing- please seek medication attention

## 2016-02-29 NOTE — Progress Notes (Signed)
Subjective:    Patient ID: Sara [name], female    DOB: 04-14-1951, 65 y.o.   MRN: [id number]  HPI: Sara [name] is a 65 y.o. female presenting on 02/29/2016 for Back Pain   HPI  Pt presents for back pain. Blew her back out with coughing last week. Pain is located in the center of her back. She was taking flexeril at home for pain- with mild relief. She has a borrowed a family members back brace- which seems to be helping her symptoms. No numbness or tingling running down legs. No bowel or bladder incontinence. Most uncomfortable is standing from lying or sitting. Bending or twisting is comfortable.  Does have a stable compression fracture in the lumbar spine.  Respiratory symptoms resolved from her previous bronchitis.  On blood thinners- did stop taking coumadin for high INR.   Past Medical History  Diagnosis Date  . GERD (gastroesophageal reflux disease)   . Osteoporosis   . Vascular disease     Sees Dr. Delana Meyer  . Vertigo     Last episode approx Aug 2015  . Depression   . History of blood clots   . Stomach ulcer     Current Outpatient Prescriptions on File Prior to Visit  Medication Sig  . albuterol (PROVENTIL HFA;VENTOLIN HFA) 108 (90 Base) MCG/ACT inhaler Inhale 2 puffs into the lungs every 6 (six) hours as needed for wheezing or shortness of breath.  Marland Kitchen atorvastatin (LIPITOR) 10 MG tablet Take 1 tablet by mouth  daily  . azithromycin (ZITHROMAX) 250 MG tablet Take 2 pills by mouth today then 1 pill by mouth daily until bottle empty.  . benzonatate (TESSALON) 100 MG capsule Take 1 capsule (100 mg total) by mouth 3 (three) times daily as needed.  Marland Kitchen buPROPion (WELLBUTRIN XL) 150 MG 24 hr tablet Take 1 tablet (150 mg total) by mouth daily.  . Calcium Carbonate-Vitamin D (CALCIUM-VITAMIN D) 500-200 MG-UNIT tablet Take 3 tablets by mouth daily.  . clobetasol cream (TEMOVATE) AB-123456789 % Apply 1 application topically 2 (two) times daily.  . clopidogrel (PLAVIX) 75 MG tablet Take 1  tablet (75 mg total) by mouth daily.  Marland Kitchen denosumab (PROLIA) 60 MG/ML SOLN injection Inject 60 mg into the skin every 6 (six) months. Administer in upper arm, thigh, or abdomen  . dextromethorphan-guaiFENesin (MUCINEX DM) 30-600 MG 12hr tablet Take 1 tablet by mouth 2 (two) times daily.  . Melatonin 1 MG TABS Take 1 tablet (1 mg total) by mouth Nightly.  . Multiple Vitamin (MULTIVITAMIN) capsule Take 1 capsule by mouth daily. PM  . nitroGLYCERIN (NITROSTAT) 0.4 MG SL tablet Place 1 tablet (0.4 mg total) under the tongue every 5 (five) minutes as needed for chest pain.  . pantoprazole (PROTONIX) 40 MG tablet Take 1 tablet (40 mg total) by mouth 2 (two) times daily.  Marland Kitchen triamcinolone cream (KENALOG) 0.1 % Apply 1 application topically 2 (two) times daily.  Marland Kitchen venlafaxine XR (EFFEXOR-XR) 75 MG 24 hr capsule Take 1 capsule (75 mg total) by mouth daily with breakfast.  . warfarin (COUMADIN) 5 MG tablet Take 1 tablet (5 mg total) by mouth daily at 6 PM.   No current facility-administered medications on file prior to visit.    Review of Systems  Constitutional: Negative for fever and chills.  HENT: Negative.   Respiratory: Negative for cough, chest tightness and wheezing.   Cardiovascular: Negative for chest pain and leg swelling.  Gastrointestinal: Negative for nausea, vomiting, abdominal pain, diarrhea and constipation.  Endocrine:  Negative.  Negative for cold intolerance, heat intolerance, polydipsia, polyphagia and polyuria.  Genitourinary: Negative for dysuria and difficulty urinating.  Musculoskeletal: Positive for back pain and gait problem.  Neurological: Negative for dizziness, light-headedness and numbness.  Psychiatric/Behavioral: Negative.    Per HPI unless specifically indicated above     Objective:    BP 109/74 mmHg  Pulse 112  Temp(Src) 98.2 F (36.8 C) (Oral)  Resp 16  Ht 5\' 4"  (1.626 m)  Wt 142 lb (64.411 kg)  BMI 24.36 kg/m2  Wt Readings from Last 3 Encounters:    02/29/16 142 lb (64.411 kg)  02/24/16 144 lb (65.318 kg)  02/21/16 145 lb (65.772 kg)    Physical Exam  Constitutional: She appears well-developed and well-nourished.  Cardiovascular: Normal rate and regular rhythm.  Exam reveals no gallop and no friction rub.   No murmur heard. Pulmonary/Chest: Effort normal and breath sounds normal. No respiratory distress. She has no wheezes.  Musculoskeletal:       Lumbar back: She exhibits decreased range of motion (due to pain. ) and pain. She exhibits no bony tenderness, no swelling and no edema.  Neurological: She is alert. She has normal strength and normal reflexes. No cranial nerve deficit or sensory deficit. She displays a negative Romberg sign.  Antalgic gait.    Results for orders placed or performed during the hospital encounter of 02/05/16  MRSA PCR Screening  Result Value Ref Range   MRSA by PCR NEGATIVE NEGATIVE  NM Myocar Multi W/Spect W/Wall Motion / EF  Result Value Ref Range   Rest HR 93 bpm   Rest BP 121/72 mmHg   Exercise duration (min)  min   Exercise duration (sec)  sec   Estimated workload  METS   Peak HR 120 bpm   Peak BP 120/45 mmHg   MPHR  bpm   Percent HR 76 %   RPE     LV sys vol 16 mL   TID 0.50    LV dias vol 39 46 - 106 mL   LHR     SSS 6    SRS 1    SDS 0   Basic metabolic panel  Result Value Ref Range   Sodium 138 135 - 145 mmol/L   Potassium 3.6 3.5 - 5.1 mmol/L   Chloride 103 101 - 111 mmol/L   CO2 25 22 - 32 mmol/L   Glucose, Bld 106 (H) 65 - 99 mg/dL   BUN 12 6 - 20 mg/dL   Creatinine, Ser 1.16 (H) 0.44 - 1.00 mg/dL   Calcium 8.9 8.9 - 10.3 mg/dL   GFR calc non Af Amer 49 (L) >60 mL/min   GFR calc Af Amer 57 (L) >60 mL/min   Anion gap 10 5 - 15  CBC  Result Value Ref Range   WBC 12.2 (H) 3.6 - 11.0 K/uL   RBC 4.64 3.80 - 5.20 MIL/uL   Hemoglobin 14.7 12.0 - 16.0 g/dL   HCT 43.5 35.0 - 47.0 %   MCV 93.8 80.0 - 100.0 fL   MCH 31.6 26.0 - 34.0 pg   MCHC 33.7 32.0 - 36.0 g/dL   RDW 13.2  11.5 - 14.5 %   Platelets 200 150 - 440 K/uL  Protime-INR  Result Value Ref Range   Prothrombin Time 12.7 11.4 - 15.0 seconds   INR 0.93   Hepatic function panel  Result Value Ref Range   Total Protein 7.2 6.5 - 8.1 g/dL   Albumin 4.2  3.5 - 5.0 g/dL   AST 28 15 - 41 U/L   ALT 32 14 - 54 U/L   Alkaline Phosphatase 52 38 - 126 U/L   Total Bilirubin 0.1 (L) 0.3 - 1.2 mg/dL   Bilirubin, Direct <0.1 (L) 0.1 - 0.5 mg/dL   Indirect Bilirubin NOT CALCULATED 0.3 - 0.9 mg/dL  Basic metabolic panel  Result Value Ref Range   Sodium 136 135 - 145 mmol/L   Potassium 3.9 3.5 - 5.1 mmol/L   Chloride 105 101 - 111 mmol/L   CO2 23 22 - 32 mmol/L   Glucose, Bld 104 (H) 65 - 99 mg/dL   BUN 11 6 - 20 mg/dL   Creatinine, Ser 0.96 0.44 - 1.00 mg/dL   Calcium 8.7 (L) 8.9 - 10.3 mg/dL   GFR calc non Af Amer >60 >60 mL/min   GFR calc Af Amer >60 >60 mL/min   Anion gap 8 5 - 15  CBC  Result Value Ref Range   WBC 10.9 3.6 - 11.0 K/uL   RBC 4.48 3.80 - 5.20 MIL/uL   Hemoglobin 14.0 12.0 - 16.0 g/dL   HCT 41.9 35.0 - 47.0 %   MCV 93.7 80.0 - 100.0 fL   MCH 31.2 26.0 - 34.0 pg   MCHC 33.3 32.0 - 36.0 g/dL   RDW 13.4 11.5 - 14.5 %   Platelets 185 150 - 440 K/uL  APTT  Result Value Ref Range   aPTT >160 (HH) 24 - 36 seconds  Heparin level (unfractionated)  Result Value Ref Range   Heparin Unfractionated 1.22 (H) 0.30 - 0.70 IU/mL  Heparin level (unfractionated)  Result Value Ref Range   Heparin Unfractionated 0.72 (H) 0.30 - 0.70 IU/mL  CBC  Result Value Ref Range   WBC 10.7 3.6 - 11.0 K/uL   RBC 4.54 3.80 - 5.20 MIL/uL   Hemoglobin 14.2 12.0 - 16.0 g/dL   HCT 42.8 35.0 - 47.0 %   MCV 94.2 80.0 - 100.0 fL   MCH 31.2 26.0 - 34.0 pg   MCHC 33.1 32.0 - 36.0 g/dL   RDW 13.7 11.5 - 14.5 %   Platelets 190 150 - 440 K/uL  Heparin level (unfractionated)  Result Value Ref Range   Heparin Unfractionated 0.65 0.30 - 0.70 IU/mL  CBC  Result Value Ref Range   WBC 12.6 (H) 3.6 - 11.0 K/uL   RBC  4.37 3.80 - 5.20 MIL/uL   Hemoglobin 13.9 12.0 - 16.0 g/dL   HCT 41.4 35.0 - 47.0 %   MCV 94.8 80.0 - 100.0 fL   MCH 31.7 26.0 - 34.0 pg   MCHC 33.4 32.0 - 36.0 g/dL   RDW 13.5 11.5 - 14.5 %   Platelets 190 150 - 440 K/uL  Heparin level (unfractionated)  Result Value Ref Range   Heparin Unfractionated 0.61 0.30 - 0.70 IU/mL  Heparin level (unfractionated)  Result Value Ref Range   Heparin Unfractionated 0.54 0.30 - 0.70 IU/mL  Troponin I  Result Value Ref Range   Troponin I <0.03 <0.031 ng/mL  Heparin level (unfractionated)  Result Value Ref Range   Heparin Unfractionated 0.58 0.30 - 0.70 IU/mL  CBC  Result Value Ref Range   WBC 10.4 3.6 - 11.0 K/uL   RBC 4.36 3.80 - 5.20 MIL/uL   Hemoglobin 14.1 12.0 - 16.0 g/dL   HCT 41.8 35.0 - 47.0 %   MCV 96.0 80.0 - 100.0 fL   MCH 32.3 26.0 - 34.0 pg  MCHC 33.7 32.0 - 36.0 g/dL   RDW 13.5 11.5 - 14.5 %   Platelets 184 150 - 440 K/uL  Basic metabolic panel  Result Value Ref Range   Sodium 138 135 - 145 mmol/L   Potassium 4.5 3.5 - 5.1 mmol/L   Chloride 105 101 - 111 mmol/L   CO2 26 22 - 32 mmol/L   Glucose, Bld 107 (H) 65 - 99 mg/dL   BUN 12 6 - 20 mg/dL   Creatinine, Ser 0.97 0.44 - 1.00 mg/dL   Calcium 8.7 (L) 8.9 - 10.3 mg/dL   GFR calc non Af Amer >60 >60 mL/min   GFR calc Af Amer >60 >60 mL/min   Anion gap 7 5 - 15  Magnesium  Result Value Ref Range   Magnesium 2.1 1.7 - 2.4 mg/dL  Heparin level (unfractionated)  Result Value Ref Range   Heparin Unfractionated 0.51 0.30 - 0.70 IU/mL  CBC  Result Value Ref Range   WBC 9.4 3.6 - 11.0 K/uL   RBC 4.15 3.80 - 5.20 MIL/uL   Hemoglobin 13.3 12.0 - 16.0 g/dL   HCT 39.1 35.0 - 47.0 %   MCV 94.1 80.0 - 100.0 fL   MCH 32.0 26.0 - 34.0 pg   MCHC 34.0 32.0 - 36.0 g/dL   RDW 13.4 11.5 - 14.5 %   Platelets 162 150 - 440 K/uL  Basic metabolic panel  Result Value Ref Range   Sodium 136 135 - 145 mmol/L   Potassium 4.2 3.5 - 5.1 mmol/L   Chloride 105 101 - 111 mmol/L   CO2  24 22 - 32 mmol/L   Glucose, Bld 112 (H) 65 - 99 mg/dL   BUN 13 6 - 20 mg/dL   Creatinine, Ser 0.87 0.44 - 1.00 mg/dL   Calcium 8.6 (L) 8.9 - 10.3 mg/dL   GFR calc non Af Amer >60 >60 mL/min   GFR calc Af Amer >60 >60 mL/min   Anion gap 7 5 - 15  Magnesium  Result Value Ref Range   Magnesium 2.1 1.7 - 2.4 mg/dL  Heparin level (unfractionated) every 6 hours x 4 post-procedure  Result Value Ref Range   Heparin Unfractionated 0.38 0.30 - 0.70 IU/mL  Heparin level (unfractionated) every 6 hours x 4 post-procedure  Result Value Ref Range   Heparin Unfractionated 0.36 0.30 - 0.70 IU/mL  Heparin level (unfractionated) every 6 hours x 4 post-procedure  Result Value Ref Range   Heparin Unfractionated 0.35 0.30 - 0.70 IU/mL  CBC every 6 hours x 4 post-procedure  Result Value Ref Range   WBC 11.6 (H) 3.6 - 11.0 K/uL   RBC 4.21 3.80 - 5.20 MIL/uL   Hemoglobin 13.4 12.0 - 16.0 g/dL   HCT 40.0 35.0 - 47.0 %   MCV 95.1 80.0 - 100.0 fL   MCH 31.9 26.0 - 34.0 pg   MCHC 33.6 32.0 - 36.0 g/dL   RDW 13.5 11.5 - 14.5 %   Platelets 145 (L) 150 - 440 K/uL  CBC every 6 hours x 4 post-procedure  Result Value Ref Range   WBC 13.9 (H) 3.6 - 11.0 K/uL   RBC 4.16 3.80 - 5.20 MIL/uL   Hemoglobin 13.1 12.0 - 16.0 g/dL   HCT 40.3 35.0 - 47.0 %   MCV 96.8 80.0 - 100.0 fL   MCH 31.5 26.0 - 34.0 pg   MCHC 32.6 32.0 - 36.0 g/dL   RDW 13.5 11.5 - 14.5 %   Platelets 113 (L)  150 - 440 K/uL  CBC every 6 hours x 4 post-procedure  Result Value Ref Range   WBC 11.9 (H) 3.6 - 11.0 K/uL   RBC 3.92 3.80 - 5.20 MIL/uL   Hemoglobin 12.4 12.0 - 16.0 g/dL   HCT 37.5 35.0 - 47.0 %   MCV 95.6 80.0 - 100.0 fL   MCH 31.6 26.0 - 34.0 pg   MCHC 33.0 32.0 - 36.0 g/dL   RDW 13.2 11.5 - 14.5 %   Platelets 103 (L) 150 - 440 K/uL  Fibrinogen every 6 hours x 4 post-procedure  Result Value Ref Range   Fibrinogen 381 210 - 470 mg/dL  Fibrinogen every 6 hours x 4 post-procedure  Result Value Ref Range   Fibrinogen 186 (L)  210 - 470 mg/dL  Fibrinogen every 6 hours x 4 post-procedure  Result Value Ref Range   Fibrinogen 161 (L) 210 - 470 mg/dL  Glucose, capillary  Result Value Ref Range   Glucose-Capillary 77 65 - 99 mg/dL   Comment 1 Notify RN   Basic metabolic panel  Result Value Ref Range   Sodium 138 135 - 145 mmol/L   Potassium 4.3 3.5 - 5.1 mmol/L   Chloride 110 101 - 111 mmol/L   CO2 23 22 - 32 mmol/L   Glucose, Bld 116 (H) 65 - 99 mg/dL   BUN 10 6 - 20 mg/dL   Creatinine, Ser 0.67 0.44 - 1.00 mg/dL   Calcium 7.6 (L) 8.9 - 10.3 mg/dL   GFR calc non Af Amer >60 >60 mL/min   GFR calc Af Amer >60 >60 mL/min   Anion gap 5 5 - 15  CBC  Result Value Ref Range   WBC 10.6 3.6 - 11.0 K/uL   RBC 3.65 (L) 3.80 - 5.20 MIL/uL   Hemoglobin 11.8 (L) 12.0 - 16.0 g/dL   HCT 34.6 (L) 35.0 - 47.0 %   MCV 94.9 80.0 - 100.0 fL   MCH 32.2 26.0 - 34.0 pg   MCHC 33.9 32.0 - 36.0 g/dL   RDW 13.6 11.5 - 14.5 %   Platelets 73 (L) 150 - 440 K/uL  Basic metabolic panel  Result Value Ref Range   Sodium 139 135 - 145 mmol/L   Potassium 3.3 (L) 3.5 - 5.1 mmol/L   Chloride 108 101 - 111 mmol/L   CO2 26 22 - 32 mmol/L   Glucose, Bld 117 (H) 65 - 99 mg/dL   BUN <5 (L) 6 - 20 mg/dL   Creatinine, Ser 0.71 0.44 - 1.00 mg/dL   Calcium 8.0 (L) 8.9 - 10.3 mg/dL   GFR calc non Af Amer >60 >60 mL/min   GFR calc Af Amer >60 >60 mL/min   Anion gap 5 5 - 15  Magnesium  Result Value Ref Range   Magnesium 1.9 1.7 - 2.4 mg/dL  Protime-INR  Result Value Ref Range   Prothrombin Time 14.3 11.4 - 15.0 seconds   INR 1.09   CBC  Result Value Ref Range   WBC 13.7 (H) 3.6 - 11.0 K/uL   RBC 3.90 3.80 - 5.20 MIL/uL   Hemoglobin 12.3 12.0 - 16.0 g/dL   HCT 36.5 35.0 - 47.0 %   MCV 93.5 80.0 - 100.0 fL   MCH 31.6 26.0 - 34.0 pg   MCHC 33.8 32.0 - 36.0 g/dL   RDW 13.6 11.5 - 14.5 %   Platelets 88 (L) 150 - 440 K/uL  Basic metabolic panel  Result Value Ref Range  Sodium 139 135 - 145 mmol/L   Potassium 4.7 3.5 - 5.1 mmol/L     Chloride 109 101 - 111 mmol/L   CO2 25 22 - 32 mmol/L   Glucose, Bld 145 (H) 65 - 99 mg/dL   BUN 9 6 - 20 mg/dL   Creatinine, Ser 0.67 0.44 - 1.00 mg/dL   Calcium 8.7 (L) 8.9 - 10.3 mg/dL   GFR calc non Af Amer >60 >60 mL/min   GFR calc Af Amer >60 >60 mL/min   Anion gap 5 5 - 15  Magnesium  Result Value Ref Range   Magnesium 2.2 1.7 - 2.4 mg/dL  Protime-INR  Result Value Ref Range   Prothrombin Time 15.1 (H) 11.4 - 15.0 seconds   INR 1.17   Protime-INR  Result Value Ref Range   Prothrombin Time 20.5 (H) 11.4 - 15.0 seconds   INR 1.76   CBC  Result Value Ref Range   WBC 16.2 (H) 3.6 - 11.0 K/uL   RBC 3.94 3.80 - 5.20 MIL/uL   Hemoglobin 12.7 12.0 - 16.0 g/dL   HCT 37.5 35.0 - 47.0 %   MCV 95.2 80.0 - 100.0 fL   MCH 32.2 26.0 - 34.0 pg   MCHC 33.9 32.0 - 36.0 g/dL   RDW 13.5 11.5 - 14.5 %   Platelets 116 (L) 150 - 440 K/uL  Basic metabolic panel  Result Value Ref Range   Sodium 140 135 - 145 mmol/L   Potassium 3.6 3.5 - 5.1 mmol/L   Chloride 108 101 - 111 mmol/L   CO2 27 22 - 32 mmol/L   Glucose, Bld 106 (H) 65 - 99 mg/dL   BUN 8 6 - 20 mg/dL   Creatinine, Ser 0.74 0.44 - 1.00 mg/dL   Calcium 8.5 (L) 8.9 - 10.3 mg/dL   GFR calc non Af Amer >60 >60 mL/min   GFR calc Af Amer >60 >60 mL/min   Anion gap 5 5 - 15  Magnesium  Result Value Ref Range   Magnesium 2.1 1.7 - 2.4 mg/dL  Protime-INR  Result Value Ref Range   Prothrombin Time 26.4 (H) 11.4 - 15.0 seconds   INR 2.46   ECHO COMPLETE  Result Value Ref Range   Weight 2409.6 oz   Height 64 in   BP 106/85 mmHg   LV PW d 9.6 (A) 0.6 - 1.1 mm   FS 29 28 - 44 %   LA vol 28.2 mL   Ao-asc 34 cm   LA ID, A-P, ES 31 mm   IVS/LV PW RATIO, ED 1.07    AV pk vel 113 cm/s   AV Area VTI 2.01 cm2   MV Annulus VTI 23.9 cm   LA diam index 1.79 cm/m2   LA vol A4C 28.5 ml   Mean grad 2 mmHg   Area-P 1/2 2.89 cm2   E decel time 197 msec   LVOT diameter 18 mm   LVOT area 2.54 cm2   LVOT peak vel 89.2 cm/s   Ao pk  vel .79 m/s   Peak grad 5 mmHg   MV pk E vel 67.4 m/s   P 1/2 time 76 ms   MV pk A vel 81 m/s   LA vol index 16.3 mL/m2   AV peak Index 1.16    MV M vel 58.1    MV Dec 197    LA diam end sys 31 mm      Assessment & Plan:  Problem List Items Addressed This Visit    None    Visit Diagnoses    Right-sided low back pain without sciatica        Treat symptomatically. Flexeril and percocet for severe pain (pt can take despite norco allergy). Supportive care. Alarm symptoms reviewed. Continue brace. Trial of lidocaine patch. COnsider prednisone for severe symptoms but will avoid due to elevated INR.     Relevant Medications    cyclobenzaprine (FLEXERIL) 10 MG tablet    oxyCODONE-acetaminophen (PERCOCET) 10-325 MG tablet       Meds ordered this encounter  Medications  . Lido-Capsaicin-Men-Methyl Sal 0.5-0.035-5-20 % PTCH    Sig: Apply 1 patch topically daily. Apply for 12 hours and then remove for 12 hours and apply new patch.    Dispense:  30 patch    Order Specific Question:  Supervising Provider    Answer:  Arlis Porta [phone]  . cyclobenzaprine (FLEXERIL) 10 MG tablet    Sig: Take 1 tablet (10 mg total) by mouth 3 (three) times daily as needed for muscle spasms.    Dispense:  30 tablet    Refill:  0    Order Specific Question:  Supervising Provider    Answer:  Arlis Porta [phone]  . oxyCODONE-acetaminophen (PERCOCET) 10-325 MG tablet    Sig: Take 1 tablet by mouth every 8 (eight) hours as needed for pain.    Dispense:  15 tablet    Refill:  0    Order Specific Question:  Supervising Provider    Answer:  Arlis Porta [phone]      Follow up plan: Return if symptoms worsen or fail to improve.

## 2016-03-01 ENCOUNTER — Ambulatory Visit (INDEPENDENT_AMBULATORY_CARE_PROVIDER_SITE_OTHER): Payer: Medicare Other

## 2016-03-01 DIAGNOSIS — R079 Chest pain, unspecified: Secondary | ICD-10-CM

## 2016-03-01 DIAGNOSIS — E785 Hyperlipidemia, unspecified: Secondary | ICD-10-CM | POA: Diagnosis not present

## 2016-03-01 DIAGNOSIS — I739 Peripheral vascular disease, unspecified: Secondary | ICD-10-CM

## 2016-03-09 ENCOUNTER — Encounter: Payer: Self-pay | Admitting: Family Medicine

## 2016-03-09 ENCOUNTER — Ambulatory Visit
Admission: RE | Admit: 2016-03-09 | Discharge: 2016-03-09 | Disposition: A | Discharge status: Home / Self Care | Payer: Medicare Other | Source: Ambulatory Visit | Attending: Family Medicine | Admitting: Family Medicine

## 2016-03-09 ENCOUNTER — Ambulatory Visit (INDEPENDENT_AMBULATORY_CARE_PROVIDER_SITE_OTHER): Payer: Medicare Other | Admitting: Family Medicine

## 2016-03-09 ENCOUNTER — Telehealth: Payer: Self-pay | Admitting: Family Medicine

## 2016-03-09 VITALS — BP 130/77 | HR 116 | Temp 98.8°F | Resp 16 | Ht 64.0 in | Wt 145.0 lb

## 2016-03-09 DIAGNOSIS — M810 Age-related osteoporosis without current pathological fracture: Secondary | ICD-10-CM | POA: Insufficient documentation

## 2016-03-09 DIAGNOSIS — M545 Low back pain, unspecified: Secondary | ICD-10-CM

## 2016-03-09 MED ORDER — CYCLOBENZAPRINE HCL 10 MG PO TABS: 10.0000 mg | ORAL_TABLET | Freq: Three times a day (TID) | ORAL | Status: DC | PRN

## 2016-03-09 MED ORDER — OXYCODONE-ACETAMINOPHEN 10-325 MG PO TABS: 1.0000 | ORAL_TABLET | Freq: Three times a day (TID) | ORAL | Status: DC | PRN

## 2016-03-09 NOTE — Telephone Encounter (Signed)
Duplicate phone call

## 2016-03-09 NOTE — Telephone Encounter (Signed)
Called to review XR results with patient. LMTCB.  She has a new compression fracture in her spine. The treatment for this is rest and pain management. No heavy lifting but stay mobile. The brace should help with pain. PT would be the most helpful for her at this time. I really think she should go. If pain continues to be severe over the next few weeks, we will send her to see an orthopedist. Thanks! AK

## 2016-03-09 NOTE — Telephone Encounter (Signed)
Pt returning your call

## 2016-03-09 NOTE — Assessment & Plan Note (Signed)
Known osteoporosis. No current treatment at this time. Chronic know compression fracture- will rule out another compression fracture.

## 2016-03-09 NOTE — Progress Notes (Signed)
Subjective:    Patient ID: Sara [name], female    DOB: 05-28-1951, 65 y.o.   MRN: [id number]  HPI: Sara [name] is a 65 y.o. female presenting on 03/09/2016 for Back Pain   HPI  Pt presents for recurrent back pain. Painful since coughing for bronchitis  A few weeks ago. Pain is not getting much better but not worsening. No saddle anesthesia. No bowel or bladder dysfunction. No progressive weakness or numbness. Pain is located at the top of the lumber spine. Wearing a back brace helps. Taking percocet PRN for pain- mild relief. Flexeril is mildly helpful. Pt has a known chronic compression fracture and osteoporosis. She is refusing PT due to cost.   Past Medical History  Diagnosis Date  . GERD (gastroesophageal reflux disease)   . Osteoporosis   . Vascular disease     Sees Dr. Delana Meyer  . Vertigo     Last episode approx Aug 2015  . Depression   . History of blood clots   . Stomach ulcer     Current Outpatient Prescriptions on File Prior to Visit  Medication Sig  . albuterol (PROVENTIL HFA;VENTOLIN HFA) 108 (90 Base) MCG/ACT inhaler Inhale 2 puffs into the lungs every 6 (six) hours as needed for wheezing or shortness of breath.  Marland Kitchen atorvastatin (LIPITOR) 10 MG tablet Take 1 tablet by mouth  daily  . azithromycin (ZITHROMAX) 250 MG tablet Take 2 pills by mouth today then 1 pill by mouth daily until bottle empty.  . benzonatate (TESSALON) 100 MG capsule Take 1 capsule (100 mg total) by mouth 3 (three) times daily as needed.  Marland Kitchen buPROPion (WELLBUTRIN XL) 150 MG 24 hr tablet Take 1 tablet (150 mg total) by mouth daily.  . Calcium Carbonate-Vitamin D (CALCIUM-VITAMIN D) 500-200 MG-UNIT tablet Take 3 tablets by mouth daily.  . clobetasol cream (TEMOVATE) [id number] % Apply 1 application topically 2 (two) times daily.  . clopidogrel (PLAVIX) 75 MG tablet Take 1 tablet (75 mg total) by mouth daily.  Marland Kitchen denosumab (PROLIA) 60 MG/ML SOLN injection Inject 60 mg into the skin every 6 (six) months.  Administer in upper arm, thigh, or abdomen  . dextromethorphan-guaiFENesin (MUCINEX DM) 30-600 MG 12hr tablet Take 1 tablet by mouth 2 (two) times daily.  . Lido-Capsaicin-Men-Methyl Sal 0.5-0.035-5-20 % PTCH Apply 1 patch topically daily. Apply for 12 hours and then remove for 12 hours and apply new patch.  . Melatonin 1 MG TABS Take 1 tablet (1 mg total) by mouth Nightly.  . Multiple Vitamin (MULTIVITAMIN) capsule Take 1 capsule by mouth daily. PM  . nitroGLYCERIN (NITROSTAT) 0.4 MG SL tablet Place 1 tablet (0.4 mg total) under the tongue every 5 (five) minutes as needed for chest pain.  . pantoprazole (PROTONIX) 40 MG tablet Take 1 tablet (40 mg total) by mouth 2 (two) times daily.  Marland Kitchen triamcinolone cream (KENALOG) 0.1 % Apply 1 application topically 2 (two) times daily.  Marland Kitchen venlafaxine XR (EFFEXOR-XR) 75 MG 24 hr capsule Take 1 capsule (75 mg total) by mouth daily with breakfast.  . warfarin (COUMADIN) 5 MG tablet Take 1 tablet (5 mg total) by mouth daily at 6 PM.   No current facility-administered medications on file prior to visit.    Review of Systems  Constitutional: Negative for fever and chills.  HENT: Negative.   Respiratory: Negative for cough, chest tightness and wheezing.   Cardiovascular: Negative for chest pain and leg swelling.  Gastrointestinal: Negative for nausea, vomiting, abdominal pain, diarrhea and constipation.  Endocrine: Negative.  Negative for cold intolerance, heat intolerance, polydipsia, polyphagia and polyuria.  Genitourinary: Negative for dysuria and difficulty urinating.  Musculoskeletal: Positive for back pain. Negative for neck pain and neck stiffness.  Neurological: Negative for dizziness, weakness, light-headedness and numbness.  Psychiatric/Behavioral: Negative.    Per HPI unless specifically indicated above     Objective:    BP 130/77 mmHg  Pulse 116  Temp(Src) 98.8 F (37.1 C) (Oral)  Resp 16  Ht 5\' 4"  (1.626 m)  Wt 145 lb (65.772 kg)  BMI  24.88 kg/m2  Wt Readings from Last 3 Encounters:  03/09/16 145 lb (65.772 kg)  02/29/16 142 lb (64.411 kg)  02/24/16 144 lb (65.318 kg)    Physical Exam  Constitutional: She is oriented to person, place, and time. She appears well-developed and well-nourished.  HENT:  Head: Normocephalic and atraumatic.  Neck: Neck supple.  Cardiovascular: Normal rate, regular rhythm and normal heart sounds.  Exam reveals no gallop and no friction rub.   No murmur heard. Pulmonary/Chest: Effort normal and breath sounds normal. She has no wheezes. She exhibits no tenderness.  Abdominal: Soft. Normal appearance and bowel sounds are normal. She exhibits no distension and no mass. There is no tenderness. There is no rebound and no guarding.  Musculoskeletal: Normal range of motion. She exhibits no edema.       Lumbar back: She exhibits tenderness, bony tenderness (lumbar spine.) and pain. She exhibits normal range of motion, no edema and no deformity.       Back:  Negative straight leg raise.   Lymphadenopathy:    She has no cervical adenopathy.  Neurological: She is alert and oriented to person, place, and time. She has normal strength. No sensory deficit. She displays a negative Romberg sign. Gait (antalgic) abnormal.  Reflex Scores:      Patellar reflexes are 2+ on the right side and 2+ on the left side. Skin: Skin is warm and dry.   Results for orders placed or performed during the hospital encounter of 02/05/16  MRSA PCR Screening  Result Value Ref Range   MRSA by PCR NEGATIVE NEGATIVE  NM Myocar Multi W/Spect W/Wall Motion / EF  Result Value Ref Range   Rest HR 93 bpm   Rest BP 121/72 mmHg   Exercise duration (min)  min   Exercise duration (sec)  sec   Estimated workload  METS   Peak HR 120 bpm   Peak BP 120/45 mmHg   MPHR  bpm   Percent HR 76 %   RPE     LV sys vol 16 mL   TID 0.50    LV dias vol 39 46 - 106 mL   LHR     SSS 6    SRS 1    SDS 0   Basic metabolic panel  Result  Value Ref Range   Sodium 138 135 - 145 mmol/L   Potassium 3.6 3.5 - 5.1 mmol/L   Chloride 103 101 - 111 mmol/L   CO2 25 22 - 32 mmol/L   Glucose, Bld 106 (H) 65 - 99 mg/dL   BUN 12 6 - 20 mg/dL   Creatinine, Ser 1.16 (H) 0.44 - 1.00 mg/dL   Calcium 8.9 8.9 - 10.3 mg/dL   GFR calc non Af Amer 49 (L) >60 mL/min   GFR calc Af Amer 57 (L) >60 mL/min   Anion gap 10 5 - 15  CBC  Result Value Ref Range   WBC 12.2 (  H) 3.6 - 11.0 K/uL   RBC 4.64 3.80 - 5.20 MIL/uL   Hemoglobin 14.7 12.0 - 16.0 g/dL   HCT 43.5 35.0 - 47.0 %   MCV 93.8 80.0 - 100.0 fL   MCH 31.6 26.0 - 34.0 pg   MCHC 33.7 32.0 - 36.0 g/dL   RDW 13.2 11.5 - 14.5 %   Platelets 200 150 - 440 K/uL  Protime-INR  Result Value Ref Range   Prothrombin Time 12.7 11.4 - 15.0 seconds   INR 0.93   Hepatic function panel  Result Value Ref Range   Total Protein 7.2 6.5 - 8.1 g/dL   Albumin 4.2 3.5 - 5.0 g/dL   AST 28 15 - 41 U/L   ALT 32 14 - 54 U/L   Alkaline Phosphatase 52 38 - 126 U/L   Total Bilirubin 0.1 (L) 0.3 - 1.2 mg/dL   Bilirubin, Direct <0.1 (L) 0.1 - 0.5 mg/dL   Indirect Bilirubin NOT CALCULATED 0.3 - 0.9 mg/dL  Basic metabolic panel  Result Value Ref Range   Sodium 136 135 - 145 mmol/L   Potassium 3.9 3.5 - 5.1 mmol/L   Chloride 105 101 - 111 mmol/L   CO2 23 22 - 32 mmol/L   Glucose, Bld 104 (H) 65 - 99 mg/dL   BUN 11 6 - 20 mg/dL   Creatinine, Ser 0.96 0.44 - 1.00 mg/dL   Calcium 8.7 (L) 8.9 - 10.3 mg/dL   GFR calc non Af Amer >60 >60 mL/min   GFR calc Af Amer >60 >60 mL/min   Anion gap 8 5 - 15  CBC  Result Value Ref Range   WBC 10.9 3.6 - 11.0 K/uL   RBC 4.48 3.80 - 5.20 MIL/uL   Hemoglobin 14.0 12.0 - 16.0 g/dL   HCT 41.9 35.0 - 47.0 %   MCV 93.7 80.0 - 100.0 fL   MCH 31.2 26.0 - 34.0 pg   MCHC 33.3 32.0 - 36.0 g/dL   RDW 13.4 11.5 - 14.5 %   Platelets 185 150 - 440 K/uL  APTT  Result Value Ref Range   aPTT >160 (HH) 24 - 36 seconds  Heparin level (unfractionated)  Result Value Ref Range    Heparin Unfractionated 1.22 (H) 0.30 - 0.70 IU/mL  Heparin level (unfractionated)  Result Value Ref Range   Heparin Unfractionated 0.72 (H) 0.30 - 0.70 IU/mL  CBC  Result Value Ref Range   WBC 10.7 3.6 - 11.0 K/uL   RBC 4.54 3.80 - 5.20 MIL/uL   Hemoglobin 14.2 12.0 - 16.0 g/dL   HCT 42.8 35.0 - 47.0 %   MCV 94.2 80.0 - 100.0 fL   MCH 31.2 26.0 - 34.0 pg   MCHC 33.1 32.0 - 36.0 g/dL   RDW 13.7 11.5 - 14.5 %   Platelets 190 150 - 440 K/uL  Heparin level (unfractionated)  Result Value Ref Range   Heparin Unfractionated 0.65 0.30 - 0.70 IU/mL  CBC  Result Value Ref Range   WBC 12.6 (H) 3.6 - 11.0 K/uL   RBC 4.37 3.80 - 5.20 MIL/uL   Hemoglobin 13.9 12.0 - 16.0 g/dL   HCT 41.4 35.0 - 47.0 %   MCV 94.8 80.0 - 100.0 fL   MCH 31.7 26.0 - 34.0 pg   MCHC 33.4 32.0 - 36.0 g/dL   RDW 13.5 11.5 - 14.5 %   Platelets 190 150 - 440 K/uL  Heparin level (unfractionated)  Result Value Ref Range   Heparin Unfractionated 0.61  0.30 - 0.70 IU/mL  Heparin level (unfractionated)  Result Value Ref Range   Heparin Unfractionated 0.54 0.30 - 0.70 IU/mL  Troponin I  Result Value Ref Range   Troponin I <0.03 <0.031 ng/mL  Heparin level (unfractionated)  Result Value Ref Range   Heparin Unfractionated 0.58 0.30 - 0.70 IU/mL  CBC  Result Value Ref Range   WBC 10.4 3.6 - 11.0 K/uL   RBC 4.36 3.80 - 5.20 MIL/uL   Hemoglobin 14.1 12.0 - 16.0 g/dL   HCT 41.8 35.0 - 47.0 %   MCV 96.0 80.0 - 100.0 fL   MCH 32.3 26.0 - 34.0 pg   MCHC 33.7 32.0 - 36.0 g/dL   RDW 13.5 11.5 - 14.5 %   Platelets 184 150 - 440 K/uL  Basic metabolic panel  Result Value Ref Range   Sodium 138 135 - 145 mmol/L   Potassium 4.5 3.5 - 5.1 mmol/L   Chloride 105 101 - 111 mmol/L   CO2 26 22 - 32 mmol/L   Glucose, Bld 107 (H) 65 - 99 mg/dL   BUN 12 6 - 20 mg/dL   Creatinine, Ser 0.97 0.44 - 1.00 mg/dL   Calcium 8.7 (L) 8.9 - 10.3 mg/dL   GFR calc non Af Amer >60 >60 mL/min   GFR calc Af Amer >60 >60 mL/min   Anion gap 7  5 - 15  Magnesium  Result Value Ref Range   Magnesium 2.1 1.7 - 2.4 mg/dL  Heparin level (unfractionated)  Result Value Ref Range   Heparin Unfractionated 0.51 0.30 - 0.70 IU/mL  CBC  Result Value Ref Range   WBC 9.4 3.6 - 11.0 K/uL   RBC 4.15 3.80 - 5.20 MIL/uL   Hemoglobin 13.3 12.0 - 16.0 g/dL   HCT 39.1 35.0 - 47.0 %   MCV 94.1 80.0 - 100.0 fL   MCH 32.0 26.0 - 34.0 pg   MCHC 34.0 32.0 - 36.0 g/dL   RDW 13.4 11.5 - 14.5 %   Platelets 162 150 - 440 K/uL  Basic metabolic panel  Result Value Ref Range   Sodium 136 135 - 145 mmol/L   Potassium 4.2 3.5 - 5.1 mmol/L   Chloride 105 101 - 111 mmol/L   CO2 24 22 - 32 mmol/L   Glucose, Bld 112 (H) 65 - 99 mg/dL   BUN 13 6 - 20 mg/dL   Creatinine, Ser 0.87 0.44 - 1.00 mg/dL   Calcium 8.6 (L) 8.9 - 10.3 mg/dL   GFR calc non Af Amer >60 >60 mL/min   GFR calc Af Amer >60 >60 mL/min   Anion gap 7 5 - 15  Magnesium  Result Value Ref Range   Magnesium 2.1 1.7 - 2.4 mg/dL  Heparin level (unfractionated) every 6 hours x 4 post-procedure  Result Value Ref Range   Heparin Unfractionated 0.38 0.30 - 0.70 IU/mL  Heparin level (unfractionated) every 6 hours x 4 post-procedure  Result Value Ref Range   Heparin Unfractionated 0.36 0.30 - 0.70 IU/mL  Heparin level (unfractionated) every 6 hours x 4 post-procedure  Result Value Ref Range   Heparin Unfractionated 0.35 0.30 - 0.70 IU/mL  CBC every 6 hours x 4 post-procedure  Result Value Ref Range   WBC 11.6 (H) 3.6 - 11.0 K/uL   RBC 4.21 3.80 - 5.20 MIL/uL   Hemoglobin 13.4 12.0 - 16.0 g/dL   HCT 40.0 35.0 - 47.0 %   MCV 95.1 80.0 - 100.0 fL   MCH 31.9  26.0 - 34.0 pg   MCHC 33.6 32.0 - 36.0 g/dL   RDW 13.5 11.5 - 14.5 %   Platelets 145 (L) 150 - 440 K/uL  CBC every 6 hours x 4 post-procedure  Result Value Ref Range   WBC 13.9 (H) 3.6 - 11.0 K/uL   RBC 4.16 3.80 - 5.20 MIL/uL   Hemoglobin 13.1 12.0 - 16.0 g/dL   HCT 40.3 35.0 - 47.0 %   MCV 96.8 80.0 - 100.0 fL   MCH 31.5 26.0 -  34.0 pg   MCHC 32.6 32.0 - 36.0 g/dL   RDW 13.5 11.5 - 14.5 %   Platelets 113 (L) 150 - 440 K/uL  CBC every 6 hours x 4 post-procedure  Result Value Ref Range   WBC 11.9 (H) 3.6 - 11.0 K/uL   RBC 3.92 3.80 - 5.20 MIL/uL   Hemoglobin 12.4 12.0 - 16.0 g/dL   HCT 37.5 35.0 - 47.0 %   MCV 95.6 80.0 - 100.0 fL   MCH 31.6 26.0 - 34.0 pg   MCHC 33.0 32.0 - 36.0 g/dL   RDW 13.2 11.5 - 14.5 %   Platelets 103 (L) 150 - 440 K/uL  Fibrinogen every 6 hours x 4 post-procedure  Result Value Ref Range   Fibrinogen 381 210 - 470 mg/dL  Fibrinogen every 6 hours x 4 post-procedure  Result Value Ref Range   Fibrinogen 186 (L) 210 - 470 mg/dL  Fibrinogen every 6 hours x 4 post-procedure  Result Value Ref Range   Fibrinogen 161 (L) 210 - 470 mg/dL  Glucose, capillary  Result Value Ref Range   Glucose-Capillary 77 65 - 99 mg/dL   Comment 1 Notify RN   Basic metabolic panel  Result Value Ref Range   Sodium 138 135 - 145 mmol/L   Potassium 4.3 3.5 - 5.1 mmol/L   Chloride 110 101 - 111 mmol/L   CO2 23 22 - 32 mmol/L   Glucose, Bld 116 (H) 65 - 99 mg/dL   BUN 10 6 - 20 mg/dL   Creatinine, Ser 0.67 0.44 - 1.00 mg/dL   Calcium 7.6 (L) 8.9 - 10.3 mg/dL   GFR calc non Af Amer >60 >60 mL/min   GFR calc Af Amer >60 >60 mL/min   Anion gap 5 5 - 15  CBC  Result Value Ref Range   WBC 10.6 3.6 - 11.0 K/uL   RBC 3.65 (L) 3.80 - 5.20 MIL/uL   Hemoglobin 11.8 (L) 12.0 - 16.0 g/dL   HCT 34.6 (L) 35.0 - 47.0 %   MCV 94.9 80.0 - 100.0 fL   MCH 32.2 26.0 - 34.0 pg   MCHC 33.9 32.0 - 36.0 g/dL   RDW 13.6 11.5 - 14.5 %   Platelets 73 (L) 150 - 440 K/uL  Basic metabolic panel  Result Value Ref Range   Sodium 139 135 - 145 mmol/L   Potassium 3.3 (L) 3.5 - 5.1 mmol/L   Chloride 108 101 - 111 mmol/L   CO2 26 22 - 32 mmol/L   Glucose, Bld 117 (H) 65 - 99 mg/dL   BUN <5 (L) 6 - 20 mg/dL   Creatinine, Ser 0.71 0.44 - 1.00 mg/dL   Calcium 8.0 (L) 8.9 - 10.3 mg/dL   GFR calc non Af Amer >60 >60 mL/min   GFR  calc Af Amer >60 >60 mL/min   Anion gap 5 5 - 15  Magnesium  Result Value Ref Range   Magnesium 1.9 1.7 - 2.4  mg/dL  Protime-INR  Result Value Ref Range   Prothrombin Time 14.3 11.4 - 15.0 seconds   INR 1.09   CBC  Result Value Ref Range   WBC 13.7 (H) 3.6 - 11.0 K/uL   RBC 3.90 3.80 - 5.20 MIL/uL   Hemoglobin 12.3 12.0 - 16.0 g/dL   HCT 36.5 35.0 - 47.0 %   MCV 93.5 80.0 - 100.0 fL   MCH 31.6 26.0 - 34.0 pg   MCHC 33.8 32.0 - 36.0 g/dL   RDW 13.6 11.5 - 14.5 %   Platelets 88 (L) 150 - 440 K/uL  Basic metabolic panel  Result Value Ref Range   Sodium 139 135 - 145 mmol/L   Potassium 4.7 3.5 - 5.1 mmol/L   Chloride 109 101 - 111 mmol/L   CO2 25 22 - 32 mmol/L   Glucose, Bld 145 (H) 65 - 99 mg/dL   BUN 9 6 - 20 mg/dL   Creatinine, Ser 0.67 0.44 - 1.00 mg/dL   Calcium 8.7 (L) 8.9 - 10.3 mg/dL   GFR calc non Af Amer >60 >60 mL/min   GFR calc Af Amer >60 >60 mL/min   Anion gap 5 5 - 15  Magnesium  Result Value Ref Range   Magnesium 2.2 1.7 - 2.4 mg/dL  Protime-INR  Result Value Ref Range   Prothrombin Time 15.1 (H) 11.4 - 15.0 seconds   INR 1.17   Protime-INR  Result Value Ref Range   Prothrombin Time 20.5 (H) 11.4 - 15.0 seconds   INR 1.76   CBC  Result Value Ref Range   WBC 16.2 (H) 3.6 - 11.0 K/uL   RBC 3.94 3.80 - 5.20 MIL/uL   Hemoglobin 12.7 12.0 - 16.0 g/dL   HCT 37.5 35.0 - 47.0 %   MCV 95.2 80.0 - 100.0 fL   MCH 32.2 26.0 - 34.0 pg   MCHC 33.9 32.0 - 36.0 g/dL   RDW 13.5 11.5 - 14.5 %   Platelets 116 (L) 150 - 440 K/uL  Basic metabolic panel  Result Value Ref Range   Sodium 140 135 - 145 mmol/L   Potassium 3.6 3.5 - 5.1 mmol/L   Chloride 108 101 - 111 mmol/L   CO2 27 22 - 32 mmol/L   Glucose, Bld 106 (H) 65 - 99 mg/dL   BUN 8 6 - 20 mg/dL   Creatinine, Ser 0.74 0.44 - 1.00 mg/dL   Calcium 8.5 (L) 8.9 - 10.3 mg/dL   GFR calc non Af Amer >60 >60 mL/min   GFR calc Af Amer >60 >60 mL/min   Anion gap 5 5 - 15  Magnesium  Result Value Ref Range    Magnesium 2.1 1.7 - 2.4 mg/dL  Protime-INR  Result Value Ref Range   Prothrombin Time 26.4 (H) 11.4 - 15.0 seconds   INR 2.46   ECHO COMPLETE  Result Value Ref Range   Weight 2409.6 oz   Height 64 in   BP 106/85 mmHg   LV PW d 9.6 (A) 0.6 - 1.1 mm   FS 29 28 - 44 %   LA vol 28.2 mL   Ao-asc 34 cm   LA ID, A-P, ES 31 mm   IVS/LV PW RATIO, ED 1.07    AV pk vel 113 cm/s   AV Area VTI 2.01 cm2   MV Annulus VTI 23.9 cm   LA diam index 1.79 cm/m2   LA vol A4C 28.5 ml   Mean grad 2 mmHg  Area-P 1/2 2.89 cm2   E decel time 197 msec   LVOT diameter 18 mm   LVOT area 2.54 cm2   LVOT peak vel 89.2 cm/s   Ao pk vel .79 m/s   Peak grad 5 mmHg   MV pk E vel 67.4 m/s   P 1/2 time 76 ms   MV pk A vel 81 m/s   LA vol index 16.3 mL/m2   AV peak Index 1.16    MV M vel 58.1    MV Dec 197    LA diam end sys 31 mm      Assessment & Plan:   Problem List Items Addressed This Visit      Musculoskeletal and Integument   Osteoporosis, post-menopausal    Known osteoporosis. No current treatment at this time. Chronic know compression fracture- will rule out another compression fracture.       Relevant Orders   DG Lumbar Spine Complete    Other Visit Diagnoses    Midline low back pain without sciatica    -  Primary    DG lumbar spine to today to r/o compression fracture. Continue pain medications. Strongly advised PT- pt declines. Back alarm symptoms reviewed. Consider ortho referral for compression fracture.     Relevant Medications    oxyCODONE-acetaminophen (PERCOCET) 10-325 MG tablet    cyclobenzaprine (FLEXERIL) 10 MG tablet    Other Relevant Orders    DG Lumbar Spine Complete       Meds ordered this encounter  Medications  . oxyCODONE-acetaminophen (PERCOCET) 10-325 MG tablet    Sig: Take 1 tablet by mouth every 8 (eight) hours as needed for pain.    Dispense:  15 tablet    Refill:  0    Order Specific Question:  Supervising Provider    Answer:  Arlis Porta  [phone]  . cyclobenzaprine (FLEXERIL) 10 MG tablet    Sig: Take 1 tablet (10 mg total) by mouth 3 (three) times daily as needed for muscle spasms.    Dispense:  30 tablet    Refill:  0    Order Specific Question:  Supervising Provider    Answer:  Arlis Porta [id number]      Follow up plan: No Follow-up on file.

## 2016-03-09 NOTE — Telephone Encounter (Signed)
Pt advised and willing to wait few weeks to see how it goes doesn't have money for PT to spend.

## 2016-03-09 NOTE — Patient Instructions (Signed)
We will get and XR to r/o a fracture. Wear back brace as needed. The next step is a back specialist.  I will check on the Prolia.

## 2016-03-29 ENCOUNTER — Telehealth: Payer: Self-pay | Admitting: Family Medicine

## 2016-03-29 NOTE — Telephone Encounter (Signed)
[place] with [employer] said they received insurance verification request about prolia and they need to know the diagnosis for medication, if it's osteoprorosis or oncology.  Her call back number is [phone] ext [phone].

## 2016-03-29 NOTE — Telephone Encounter (Signed)
Left Sara [name] a message about dx: M81.0 osteoporosis.

## 2016-04-18 ENCOUNTER — Telehealth: Payer: Self-pay | Admitting: Family Medicine

## 2016-04-18 NOTE — Telephone Encounter (Signed)
Called pt- LMTCB Called to discuss the cost of Prolia- per [employer] assist she had s $20 injection cost and 20% co-insurance. Max out of pocket is $6700.  This may be too costly- the [place] program stated there were 2 foundations she could enroll in that will help her cover the cost the medications. Found number 1 is [employer] at [phone] and the other is [employer] at [phone].

## 2016-04-19 NOTE — Telephone Encounter (Signed)
Patient has appt 04/20/16.

## 2016-04-20 ENCOUNTER — Encounter: Payer: Self-pay | Admitting: Family Medicine

## 2016-04-20 ENCOUNTER — Telehealth: Payer: Self-pay | Admitting: Family Medicine

## 2016-04-20 ENCOUNTER — Ambulatory Visit (INDEPENDENT_AMBULATORY_CARE_PROVIDER_SITE_OTHER): Payer: Medicare Other | Admitting: Family Medicine

## 2016-04-20 ENCOUNTER — Ambulatory Visit
Admission: RE | Admit: 2016-04-20 | Discharge: 2016-04-20 | Disposition: A | Discharge status: Home / Self Care | Payer: Medicare Other | Source: Ambulatory Visit | Attending: Family Medicine | Admitting: Family Medicine

## 2016-04-20 VITALS — BP 112/69 | HR 118 | Temp 98.4°F | Resp 16 | Ht 64.0 in | Wt 145.6 lb

## 2016-04-20 DIAGNOSIS — L732 Hidradenitis suppurativa: Secondary | ICD-10-CM

## 2016-04-20 DIAGNOSIS — F32A Depression, unspecified: Secondary | ICD-10-CM

## 2016-04-20 DIAGNOSIS — Z7901 Long term (current) use of anticoagulants: Secondary | ICD-10-CM

## 2016-04-20 DIAGNOSIS — X58XXXD Exposure to other specified factors, subsequent encounter: Secondary | ICD-10-CM | POA: Insufficient documentation

## 2016-04-20 DIAGNOSIS — M810 Age-related osteoporosis without current pathological fracture: Secondary | ICD-10-CM

## 2016-04-20 DIAGNOSIS — S32030A Wedge compression fracture of third lumbar vertebra, initial encounter for closed fracture: Secondary | ICD-10-CM | POA: Insufficient documentation

## 2016-04-20 DIAGNOSIS — S32030D Wedge compression fracture of third lumbar vertebra, subsequent encounter for fracture with routine healing: Secondary | ICD-10-CM | POA: Diagnosis present

## 2016-04-20 DIAGNOSIS — F329 Major depressive disorder, single episode, unspecified: Secondary | ICD-10-CM

## 2016-04-20 MED ORDER — OXYCODONE-ACETAMINOPHEN 5-325 MG PO TABS: 1.0000 | ORAL_TABLET | Freq: Three times a day (TID) | ORAL | 0 refills | Status: DC | PRN

## 2016-04-20 MED ORDER — CEPHALEXIN 500 MG PO CAPS: 500.0000 mg | ORAL_CAPSULE | Freq: Two times a day (BID) | ORAL | 0 refills | Status: DC

## 2016-04-20 MED ORDER — BUPROPION HCL ER (XL) 300 MG PO TB24: 300.0000 mg | ORAL_TABLET | Freq: Every day | ORAL | 3 refills | Status: DC

## 2016-04-20 NOTE — Telephone Encounter (Signed)
Called to inform patient of her orthopedic appt- on September 6 at 2pm with Dr. Sabra Heck.

## 2016-04-20 NOTE — Progress Notes (Signed)
Subjective:    Patient ID: Sara [name], female    DOB: July 05, 1951, 65 y.o.   MRN: [id number]  HPI: Sara [name] is a 65 y.o. female presenting on 04/20/2016 for Back Pain (getting improved obtw pt has 5 cyst on R shoulder under arm onset yesterday)   HPI  Pt presents for follow-up of back pain. She has a new compression fracture at L3. She did not do PT as recommended. She stopped wearing the brace several weeks ago.  Pain has improved but not completely better. No numbness and tingling in the legs.  No focal neurological symptoms. Most painful bending and lifting.  Pt also has a a series of cysts in the R axilla. Started yesterday. Sore and painful. No fevers.   Pt is having crying episodes- doesn't think her depression medication is working. No SI or HI. Crying spells started 2-3 weeks ago. No new life stressors. Feels like she has become the mother to a mentally ill siblings. Stressed about her life situation.   Past Medical History:  Diagnosis Date  . Depression   . GERD (gastroesophageal reflux disease)   . History of blood clots   . Osteoporosis   . Stomach ulcer   . Vascular disease    Sees Dr. Delana Meyer  . Vertigo    Last episode approx Aug 2015    Current Outpatient Prescriptions on File Prior to Visit  Medication Sig  . albuterol (PROVENTIL HFA;VENTOLIN HFA) 108 (90 Base) MCG/ACT inhaler Inhale 2 puffs into the lungs every 6 (six) hours as needed for wheezing or shortness of breath.  Marland Kitchen atorvastatin (LIPITOR) 10 MG tablet Take 1 tablet by mouth  daily  . Calcium Carbonate-Vitamin D (CALCIUM-VITAMIN D) 500-200 MG-UNIT tablet Take 3 tablets by mouth daily.  . clobetasol cream (TEMOVATE) [id number] % Apply 1 application topically 2 (two) times daily.  . clopidogrel (PLAVIX) 75 MG tablet Take 1 tablet (75 mg total) by mouth daily.  . Melatonin 1 MG TABS Take 1 tablet (1 mg total) by mouth Nightly.  . Multiple Vitamin (MULTIVITAMIN) capsule Take 1 capsule by mouth daily. PM  .  nitroGLYCERIN (NITROSTAT) 0.4 MG SL tablet Place 1 tablet (0.4 mg total) under the tongue every 5 (five) minutes as needed for chest pain.  . pantoprazole (PROTONIX) 40 MG tablet Take 1 tablet (40 mg total) by mouth 2 (two) times daily.  Marland Kitchen triamcinolone cream (KENALOG) 0.1 % Apply 1 application topically 2 (two) times daily.  Marland Kitchen venlafaxine XR (EFFEXOR-XR) 75 MG 24 hr capsule Take 1 capsule (75 mg total) by mouth daily with breakfast.  . warfarin (COUMADIN) 5 MG tablet Take 1 tablet (5 mg total) by mouth daily at 6 PM.   No current facility-administered medications on file prior to visit.     Review of Systems  Constitutional: Negative for chills and fever.  HENT: Negative.   Respiratory: Negative for cough, chest tightness and wheezing.   Cardiovascular: Negative for chest pain and leg swelling.  Gastrointestinal: Negative for abdominal pain, constipation, diarrhea, nausea and vomiting.  Endocrine: Negative.  Negative for cold intolerance, heat intolerance, polydipsia, polyphagia and polyuria.  Genitourinary: Negative for difficulty urinating and dysuria.  Musculoskeletal: Positive for back pain.  Skin: Positive for rash.  Neurological: Negative for dizziness, light-headedness and numbness.  Psychiatric/Behavioral: Positive for decreased concentration, dysphoric mood and sleep disturbance. Negative for suicidal ideas. The patient is not nervous/anxious.    Per HPI unless specifically indicated above     Objective:  BP 112/69 (BP Location: Right Arm, Patient Position: Sitting, Cuff Size: Normal)   Pulse (!) 118   Temp 98.4 F (36.9 C) (Oral)   Resp 16   Ht 5\' 4"  (1.626 m)   Wt 145 lb 9.6 oz (66 kg)   BMI 24.99 kg/m   Wt Readings from Last 3 Encounters:  04/20/16 145 lb 9.6 oz (66 kg)  03/09/16 145 lb (65.8 kg)  02/29/16 142 lb (64.4 kg)    Physical Exam  Constitutional: She is oriented to person, place, and time. She appears well-developed and well-nourished.  HENT:    Head: Normocephalic and atraumatic.  Neck: Neck supple.  Cardiovascular: Normal rate, regular rhythm and normal heart sounds.  Exam reveals no gallop and no friction rub.   No murmur heard. Pulmonary/Chest: Effort normal and breath sounds normal. She has no wheezes. She exhibits no tenderness.  Abdominal: Soft. Normal appearance and bowel sounds are normal. She exhibits no distension and no mass. There is no tenderness. There is no rebound and no guarding.  Musculoskeletal: Normal range of motion. She exhibits no edema.       Lumbar back: She exhibits tenderness and pain (Pain with bending. ). She exhibits no edema, no deformity and normal pulse.       Back:  Negative straight leg raise.   Lymphadenopathy:    She has no cervical adenopathy.  Neurological: She is alert and oriented to person, place, and time.  Skin: Skin is warm and dry. Rash noted. Rash is pustular.  Scattered pustular lesions in the axilla.   Psychiatric: Her speech is normal and behavior is normal. Judgment and thought content normal. Cognition and memory are normal. She exhibits a depressed mood.   Results for orders placed or performed during the hospital encounter of 02/05/16  MRSA PCR Screening  Result Value Ref Range   MRSA by PCR NEGATIVE NEGATIVE  NM Myocar Multi W/Spect W/Wall Motion / EF  Result Value Ref Range   Rest HR 93 bpm   Rest BP 121/72 mmHg   Exercise duration (min)  min   Exercise duration (sec)  sec   Estimated workload  METS   Peak HR 120 bpm   Peak BP 120/45 mmHg   MPHR  bpm   Percent HR 76 %   RPE     LV sys vol 16 mL   TID 0.50    LV dias vol 39 46 - 106 mL   LHR     SSS 6    SRS 1    SDS 0   Basic metabolic panel  Result Value Ref Range   Sodium 138 135 - 145 mmol/L   Potassium 3.6 3.5 - 5.1 mmol/L   Chloride 103 101 - 111 mmol/L   CO2 25 22 - 32 mmol/L   Glucose, Bld 106 (H) 65 - 99 mg/dL   BUN 12 6 - 20 mg/dL   Creatinine, Ser 1.16 (H) 0.44 - 1.00 mg/dL   Calcium 8.9  8.9 - 10.3 mg/dL   GFR calc non Af Amer 49 (L) >60 mL/min   GFR calc Af Amer 57 (L) >60 mL/min   Anion gap 10 5 - 15  CBC  Result Value Ref Range   WBC 12.2 (H) 3.6 - 11.0 K/uL   RBC 4.64 3.80 - 5.20 MIL/uL   Hemoglobin 14.7 12.0 - 16.0 g/dL   HCT 43.5 35.0 - 47.0 %   MCV 93.8 80.0 - 100.0 fL   MCH 31.6 26.0 -  34.0 pg   MCHC 33.7 32.0 - 36.0 g/dL   RDW 13.2 11.5 - 14.5 %   Platelets 200 150 - 440 K/uL  Protime-INR  Result Value Ref Range   Prothrombin Time 12.7 11.4 - 15.0 seconds   INR 0.93   Hepatic function panel  Result Value Ref Range   Total Protein 7.2 6.5 - 8.1 g/dL   Albumin 4.2 3.5 - 5.0 g/dL   AST 28 15 - 41 U/L   ALT 32 14 - 54 U/L   Alkaline Phosphatase 52 38 - 126 U/L   Total Bilirubin 0.1 (L) 0.3 - 1.2 mg/dL   Bilirubin, Direct <0.1 (L) 0.1 - 0.5 mg/dL   Indirect Bilirubin NOT CALCULATED 0.3 - 0.9 mg/dL  Basic metabolic panel  Result Value Ref Range   Sodium 136 135 - 145 mmol/L   Potassium 3.9 3.5 - 5.1 mmol/L   Chloride 105 101 - 111 mmol/L   CO2 23 22 - 32 mmol/L   Glucose, Bld 104 (H) 65 - 99 mg/dL   BUN 11 6 - 20 mg/dL   Creatinine, Ser 0.96 0.44 - 1.00 mg/dL   Calcium 8.7 (L) 8.9 - 10.3 mg/dL   GFR calc non Af Amer >60 >60 mL/min   GFR calc Af Amer >60 >60 mL/min   Anion gap 8 5 - 15  CBC  Result Value Ref Range   WBC 10.9 3.6 - 11.0 K/uL   RBC 4.48 3.80 - 5.20 MIL/uL   Hemoglobin 14.0 12.0 - 16.0 g/dL   HCT 41.9 35.0 - 47.0 %   MCV 93.7 80.0 - 100.0 fL   MCH 31.2 26.0 - 34.0 pg   MCHC 33.3 32.0 - 36.0 g/dL   RDW 13.4 11.5 - 14.5 %   Platelets 185 150 - 440 K/uL  APTT  Result Value Ref Range   aPTT >160 (HH) 24 - 36 seconds  Heparin level (unfractionated)  Result Value Ref Range   Heparin Unfractionated 1.22 (H) 0.30 - 0.70 IU/mL  Heparin level (unfractionated)  Result Value Ref Range   Heparin Unfractionated 0.72 (H) 0.30 - 0.70 IU/mL  CBC  Result Value Ref Range   WBC 10.7 3.6 - 11.0 K/uL   RBC 4.54 3.80 - 5.20 MIL/uL    Hemoglobin 14.2 12.0 - 16.0 g/dL   HCT 42.8 35.0 - 47.0 %   MCV 94.2 80.0 - 100.0 fL   MCH 31.2 26.0 - 34.0 pg   MCHC 33.1 32.0 - 36.0 g/dL   RDW 13.7 11.5 - 14.5 %   Platelets 190 150 - 440 K/uL  Heparin level (unfractionated)  Result Value Ref Range   Heparin Unfractionated 0.65 0.30 - 0.70 IU/mL  CBC  Result Value Ref Range   WBC 12.6 (H) 3.6 - 11.0 K/uL   RBC 4.37 3.80 - 5.20 MIL/uL   Hemoglobin 13.9 12.0 - 16.0 g/dL   HCT 41.4 35.0 - 47.0 %   MCV 94.8 80.0 - 100.0 fL   MCH 31.7 26.0 - 34.0 pg   MCHC 33.4 32.0 - 36.0 g/dL   RDW 13.5 11.5 - 14.5 %   Platelets 190 150 - 440 K/uL  Heparin level (unfractionated)  Result Value Ref Range   Heparin Unfractionated 0.61 0.30 - 0.70 IU/mL  Heparin level (unfractionated)  Result Value Ref Range   Heparin Unfractionated 0.54 0.30 - 0.70 IU/mL  Troponin I  Result Value Ref Range   Troponin I <0.03 <0.031 ng/mL  Heparin level (unfractionated)  Result  Value Ref Range   Heparin Unfractionated 0.58 0.30 - 0.70 IU/mL  CBC  Result Value Ref Range   WBC 10.4 3.6 - 11.0 K/uL   RBC 4.36 3.80 - 5.20 MIL/uL   Hemoglobin 14.1 12.0 - 16.0 g/dL   HCT 41.8 35.0 - 47.0 %   MCV 96.0 80.0 - 100.0 fL   MCH 32.3 26.0 - 34.0 pg   MCHC 33.7 32.0 - 36.0 g/dL   RDW 13.5 11.5 - 14.5 %   Platelets 184 150 - 440 K/uL  Basic metabolic panel  Result Value Ref Range   Sodium 138 135 - 145 mmol/L   Potassium 4.5 3.5 - 5.1 mmol/L   Chloride 105 101 - 111 mmol/L   CO2 26 22 - 32 mmol/L   Glucose, Bld 107 (H) 65 - 99 mg/dL   BUN 12 6 - 20 mg/dL   Creatinine, Ser 0.97 0.44 - 1.00 mg/dL   Calcium 8.7 (L) 8.9 - 10.3 mg/dL   GFR calc non Af Amer >60 >60 mL/min   GFR calc Af Amer >60 >60 mL/min   Anion gap 7 5 - 15  Magnesium  Result Value Ref Range   Magnesium 2.1 1.7 - 2.4 mg/dL  Heparin level (unfractionated)  Result Value Ref Range   Heparin Unfractionated 0.51 0.30 - 0.70 IU/mL  CBC  Result Value Ref Range   WBC 9.4 3.6 - 11.0 K/uL   RBC 4.15  3.80 - 5.20 MIL/uL   Hemoglobin 13.3 12.0 - 16.0 g/dL   HCT 39.1 35.0 - 47.0 %   MCV 94.1 80.0 - 100.0 fL   MCH 32.0 26.0 - 34.0 pg   MCHC 34.0 32.0 - 36.0 g/dL   RDW 13.4 11.5 - 14.5 %   Platelets 162 150 - 440 K/uL  Basic metabolic panel  Result Value Ref Range   Sodium 136 135 - 145 mmol/L   Potassium 4.2 3.5 - 5.1 mmol/L   Chloride 105 101 - 111 mmol/L   CO2 24 22 - 32 mmol/L   Glucose, Bld 112 (H) 65 - 99 mg/dL   BUN 13 6 - 20 mg/dL   Creatinine, Ser 0.87 0.44 - 1.00 mg/dL   Calcium 8.6 (L) 8.9 - 10.3 mg/dL   GFR calc non Af Amer >60 >60 mL/min   GFR calc Af Amer >60 >60 mL/min   Anion gap 7 5 - 15  Magnesium  Result Value Ref Range   Magnesium 2.1 1.7 - 2.4 mg/dL  Heparin level (unfractionated) every 6 hours x 4 post-procedure  Result Value Ref Range   Heparin Unfractionated 0.38 0.30 - 0.70 IU/mL  Heparin level (unfractionated) every 6 hours x 4 post-procedure  Result Value Ref Range   Heparin Unfractionated 0.36 0.30 - 0.70 IU/mL  Heparin level (unfractionated) every 6 hours x 4 post-procedure  Result Value Ref Range   Heparin Unfractionated 0.35 0.30 - 0.70 IU/mL  CBC every 6 hours x 4 post-procedure  Result Value Ref Range   WBC 11.6 (H) 3.6 - 11.0 K/uL   RBC 4.21 3.80 - 5.20 MIL/uL   Hemoglobin 13.4 12.0 - 16.0 g/dL   HCT 40.0 35.0 - 47.0 %   MCV 95.1 80.0 - 100.0 fL   MCH 31.9 26.0 - 34.0 pg   MCHC 33.6 32.0 - 36.0 g/dL   RDW 13.5 11.5 - 14.5 %   Platelets 145 (L) 150 - 440 K/uL  CBC every 6 hours x 4 post-procedure  Result Value Ref Range  WBC 13.9 (H) 3.6 - 11.0 K/uL   RBC 4.16 3.80 - 5.20 MIL/uL   Hemoglobin 13.1 12.0 - 16.0 g/dL   HCT 40.3 35.0 - 47.0 %   MCV 96.8 80.0 - 100.0 fL   MCH 31.5 26.0 - 34.0 pg   MCHC 32.6 32.0 - 36.0 g/dL   RDW 13.5 11.5 - 14.5 %   Platelets 113 (L) 150 - 440 K/uL  CBC every 6 hours x 4 post-procedure  Result Value Ref Range   WBC 11.9 (H) 3.6 - 11.0 K/uL   RBC 3.92 3.80 - 5.20 MIL/uL   Hemoglobin 12.4 12.0 -  16.0 g/dL   HCT 37.5 35.0 - 47.0 %   MCV 95.6 80.0 - 100.0 fL   MCH 31.6 26.0 - 34.0 pg   MCHC 33.0 32.0 - 36.0 g/dL   RDW 13.2 11.5 - 14.5 %   Platelets 103 (L) 150 - 440 K/uL  Fibrinogen every 6 hours x 4 post-procedure  Result Value Ref Range   Fibrinogen 381 210 - 470 mg/dL  Fibrinogen every 6 hours x 4 post-procedure  Result Value Ref Range   Fibrinogen 186 (L) 210 - 470 mg/dL  Fibrinogen every 6 hours x 4 post-procedure  Result Value Ref Range   Fibrinogen 161 (L) 210 - 470 mg/dL  Glucose, capillary  Result Value Ref Range   Glucose-Capillary 77 65 - 99 mg/dL   Comment 1 Notify RN   Basic metabolic panel  Result Value Ref Range   Sodium 138 135 - 145 mmol/L   Potassium 4.3 3.5 - 5.1 mmol/L   Chloride 110 101 - 111 mmol/L   CO2 23 22 - 32 mmol/L   Glucose, Bld 116 (H) 65 - 99 mg/dL   BUN 10 6 - 20 mg/dL   Creatinine, Ser 0.67 0.44 - 1.00 mg/dL   Calcium 7.6 (L) 8.9 - 10.3 mg/dL   GFR calc non Af Amer >60 >60 mL/min   GFR calc Af Amer >60 >60 mL/min   Anion gap 5 5 - 15  CBC  Result Value Ref Range   WBC 10.6 3.6 - 11.0 K/uL   RBC 3.65 (L) 3.80 - 5.20 MIL/uL   Hemoglobin 11.8 (L) 12.0 - 16.0 g/dL   HCT 34.6 (L) 35.0 - 47.0 %   MCV 94.9 80.0 - 100.0 fL   MCH 32.2 26.0 - 34.0 pg   MCHC 33.9 32.0 - 36.0 g/dL   RDW 13.6 11.5 - 14.5 %   Platelets 73 (L) 150 - 440 K/uL  Basic metabolic panel  Result Value Ref Range   Sodium 139 135 - 145 mmol/L   Potassium 3.3 (L) 3.5 - 5.1 mmol/L   Chloride 108 101 - 111 mmol/L   CO2 26 22 - 32 mmol/L   Glucose, Bld 117 (H) 65 - 99 mg/dL   BUN <5 (L) 6 - 20 mg/dL   Creatinine, Ser 0.71 0.44 - 1.00 mg/dL   Calcium 8.0 (L) 8.9 - 10.3 mg/dL   GFR calc non Af Amer >60 >60 mL/min   GFR calc Af Amer >60 >60 mL/min   Anion gap 5 5 - 15  Magnesium  Result Value Ref Range   Magnesium 1.9 1.7 - 2.4 mg/dL  Protime-INR  Result Value Ref Range   Prothrombin Time 14.3 11.4 - 15.0 seconds   INR 1.09   CBC  Result Value Ref Range    WBC 13.7 (H) 3.6 - 11.0 K/uL   RBC 3.90 3.80 -  5.20 MIL/uL   Hemoglobin 12.3 12.0 - 16.0 g/dL   HCT 36.5 35.0 - 47.0 %   MCV 93.5 80.0 - 100.0 fL   MCH 31.6 26.0 - 34.0 pg   MCHC 33.8 32.0 - 36.0 g/dL   RDW 13.6 11.5 - 14.5 %   Platelets 88 (L) 150 - 440 K/uL  Basic metabolic panel  Result Value Ref Range   Sodium 139 135 - 145 mmol/L   Potassium 4.7 3.5 - 5.1 mmol/L   Chloride 109 101 - 111 mmol/L   CO2 25 22 - 32 mmol/L   Glucose, Bld 145 (H) 65 - 99 mg/dL   BUN 9 6 - 20 mg/dL   Creatinine, Ser 0.67 0.44 - 1.00 mg/dL   Calcium 8.7 (L) 8.9 - 10.3 mg/dL   GFR calc non Af Amer >60 >60 mL/min   GFR calc Af Amer >60 >60 mL/min   Anion gap 5 5 - 15  Magnesium  Result Value Ref Range   Magnesium 2.2 1.7 - 2.4 mg/dL  Protime-INR  Result Value Ref Range   Prothrombin Time 15.1 (H) 11.4 - 15.0 seconds   INR 1.17   Protime-INR  Result Value Ref Range   Prothrombin Time 20.5 (H) 11.4 - 15.0 seconds   INR 1.76   CBC  Result Value Ref Range   WBC 16.2 (H) 3.6 - 11.0 K/uL   RBC 3.94 3.80 - 5.20 MIL/uL   Hemoglobin 12.7 12.0 - 16.0 g/dL   HCT 37.5 35.0 - 47.0 %   MCV 95.2 80.0 - 100.0 fL   MCH 32.2 26.0 - 34.0 pg   MCHC 33.9 32.0 - 36.0 g/dL   RDW 13.5 11.5 - 14.5 %   Platelets 116 (L) 150 - 440 K/uL  Basic metabolic panel  Result Value Ref Range   Sodium 140 135 - 145 mmol/L   Potassium 3.6 3.5 - 5.1 mmol/L   Chloride 108 101 - 111 mmol/L   CO2 27 22 - 32 mmol/L   Glucose, Bld 106 (H) 65 - 99 mg/dL   BUN 8 6 - 20 mg/dL   Creatinine, Ser 0.74 0.44 - 1.00 mg/dL   Calcium 8.5 (L) 8.9 - 10.3 mg/dL   GFR calc non Af Amer >60 >60 mL/min   GFR calc Af Amer >60 >60 mL/min   Anion gap 5 5 - 15  Magnesium  Result Value Ref Range   Magnesium 2.1 1.7 - 2.4 mg/dL  Protime-INR  Result Value Ref Range   Prothrombin Time 26.4 (H) 11.4 - 15.0 seconds   INR 2.46   ECHO COMPLETE  Result Value Ref Range   Weight 2,409.6 oz   Height 64 in   BP 106/85 mmHg   LV PW d 9.6 (A) 0.6 -  1.1 mm   FS 29 28 - 44 %   LA vol 28.2 mL   Ao-asc 34 cm   LA ID, A-P, ES 31 mm   IVS/LV PW RATIO, ED 1.07    AV pk vel 113 cm/s   AV Area VTI 2.01 cm2   MV Annulus VTI 23.9 cm   LA diam index 1.79 cm/m2   LA vol A4C 28.5 ml   Mean grad 2 mmHg   Area-P 1/2 2.89 cm2   E decel time 197 msec   LVOT diameter 18 mm   LVOT area 2.54 cm2   LVOT peak vel 89.2 cm/s   Ao pk vel .79 m/s   Peak grad 5 mmHg  MV pk E vel 67.4 m/s   P 1/2 time 76 ms   MV pk A vel 81 m/s   LA vol index 16.3 mL/m2   AV peak Index 1.16    MV M vel 58.1    MV Dec 197    LA diam end sys 31 mm      Assessment & Plan:   Problem List Items Addressed This Visit      Musculoskeletal and Integument   Osteoporosis, post-menopausal    Have prescribed Prolia but patient cannot afford it due to insurance issues. Will plan to discuss referral to rheumatology for reclast infusions.       Compression fracture of L3 lumbar vertebra (HCC)    XR to determine if fracture is healing. Refer to orthopedics for continued pain. Patient may be candidate for kyphoplasty. Encouraged pt to continue to wear the back brace for support. Encouraged rest but continued mobility. No heavy lifting. Pt has repeatedly declined PT due to cost.       Relevant Medications   oxyCODONE-acetaminophen (PERCOCET/ROXICET) 5-325 MG tablet   Other Relevant Orders   Ambulatory referral to Orthopedic Surgery   DG Lumbar Spine 2-3 Views     Other   Clinical depression    Increase wellbutrin to 300mg  once daily. Recommend counseling to help with symptoms. Daily exercise. Recheck 4 weeks.       Relevant Medications   buPROPion (WELLBUTRIN XL) 300 MG 24 hr tablet   Anticoagulated on warfarin - Primary    Pt aware antibiotics may impact her INR. Will monitor closely.        Other Visit Diagnoses    Hidradenitis axillaris       Keflex BID for 7 days. Warm compresses TID.    Relevant Medications   cephALEXin (KEFLEX) 500 MG capsule       Meds ordered this encounter  Medications  . buPROPion (WELLBUTRIN XL) 300 MG 24 hr tablet    Sig: Take 1 tablet (300 mg total) by mouth daily.    Dispense:  90 tablet    Refill:  3    Order Specific Question:   Supervising Provider    Answer:   Arlis Porta [phone]  . cephALEXin (KEFLEX) 500 MG capsule    Sig: Take 1 capsule (500 mg total) by mouth 2 (two) times daily.    Dispense:  14 capsule    Refill:  0    Order Specific Question:   Supervising Provider    Answer:   Arlis Porta [id number]  . oxyCODONE-acetaminophen (PERCOCET/ROXICET) 5-325 MG tablet    Sig: Take 1 tablet by mouth every 8 (eight) hours as needed for severe pain.    Dispense:  15 tablet    Refill:  0    Order Specific Question:   Supervising Provider    Answer:   Arlis Porta [id number]      Follow up plan: Return in about 4 weeks (around 05/18/2016), or if symptoms worsen or fail to improve.

## 2016-04-20 NOTE — Patient Instructions (Signed)
Back pain: I recommend continuing to wear the brace daily- this will help with pain. We will get another XRay of the back and have you seen an orthopedist.  You can take oxycodone as needed.   Depression: Let's try increasing your wellbutrin to 300mg  once daily. I also recommend counseling- [facility] [place], [place]  [address], [place] [address]  [email or web address]    Cysts in axilla- take Keflex twice daily for 7 days.

## 2016-04-20 NOTE — Assessment & Plan Note (Signed)
Pt aware antibiotics may impact her INR. Will monitor closely.

## 2016-04-20 NOTE — Assessment & Plan Note (Signed)
Increase wellbutrin to 300mg  once daily. Recommend counseling to help with symptoms. Daily exercise. Recheck 4 weeks.

## 2016-04-20 NOTE — Assessment & Plan Note (Signed)
Have prescribed Prolia but patient cannot afford it due to insurance issues. Will plan to discuss referral to rheumatology for reclast infusions.

## 2016-04-20 NOTE — Assessment & Plan Note (Signed)
XR to determine if fracture is healing. Refer to orthopedics for continued pain. Patient may be candidate for kyphoplasty. Encouraged pt to continue to wear the back brace for support. Encouraged rest but continued mobility. No heavy lifting. Pt has repeatedly declined PT due to cost.

## 2016-04-21 ENCOUNTER — Telehealth: Payer: Self-pay

## 2016-04-21 NOTE — Telephone Encounter (Signed)
-----   Message from Luciana Axe, NP sent at 04/20/2016  1:44 PM EDT ----- Regarding: Ortho Appt I made pt an ortho appt next Wednesday (9/6) with Dr. Sabra Heck at 2pm. Just in case you need the information for the [facility] portal.  Thanks! AK

## 2016-04-21 NOTE — Telephone Encounter (Signed)
Pt doesn't need approval from [facility] through their portal.

## 2016-04-25 ENCOUNTER — Telehealth: Payer: Self-pay | Admitting: Family Medicine

## 2016-04-25 DIAGNOSIS — M810 Age-related osteoporosis without current pathological fracture: Secondary | ICD-10-CM

## 2016-04-25 NOTE — Telephone Encounter (Signed)
Got a call for the prolia rep- she can get her Prolia injections at certain offices in the area who buy the injection and the patient will not have a specialty pharmacy cost. Check with [facility] and they stated the patient would need to be referred to Dr. Gabriel Carina who would order the injections.

## 2016-04-26 DIAGNOSIS — M4856XA Collapsed vertebra, not elsewhere classified, lumbar region, initial encounter for fracture: Secondary | ICD-10-CM | POA: Insufficient documentation

## 2016-04-27 ENCOUNTER — Other Ambulatory Visit: Payer: Self-pay | Admitting: Family Medicine

## 2016-04-27 ENCOUNTER — Other Ambulatory Visit: Payer: Self-pay | Admitting: Orthopedic Surgery

## 2016-04-27 DIAGNOSIS — S32030A Wedge compression fracture of third lumbar vertebra, initial encounter for closed fracture: Secondary | ICD-10-CM

## 2016-04-27 DIAGNOSIS — S32030D Wedge compression fracture of third lumbar vertebra, subsequent encounter for fracture with routine healing: Secondary | ICD-10-CM

## 2016-04-28 MED ORDER — CALCITONIN (SALMON) 200 UNIT/ACT NA SOLN: 1.0000 | Freq: Every day | NASAL | 1 refills | Status: DC

## 2016-04-28 MED ORDER — OXYCODONE-ACETAMINOPHEN 10-325 MG PO TABS: 1.0000 | ORAL_TABLET | Freq: Three times a day (TID) | ORAL | 0 refills | Status: DC | PRN

## 2016-04-28 NOTE — Telephone Encounter (Signed)
Called pt to let her know the Prolia is here. She will make an appt for injection when she comes to pick up prescription for pain medication for compression fracture.

## 2016-04-29 ENCOUNTER — Ambulatory Visit
Admission: RE | Admit: 2016-04-29 | Discharge: 2016-04-29 | Disposition: A | Discharge status: Home / Self Care | Payer: Medicare Other | Source: Ambulatory Visit | Attending: Orthopedic Surgery | Admitting: Orthopedic Surgery

## 2016-04-29 DIAGNOSIS — M4806 Spinal stenosis, lumbar region: Secondary | ICD-10-CM | POA: Diagnosis not present

## 2016-04-29 DIAGNOSIS — R29890 Loss of height: Secondary | ICD-10-CM | POA: Diagnosis not present

## 2016-04-29 DIAGNOSIS — M4856XA Collapsed vertebra, not elsewhere classified, lumbar region, initial encounter for fracture: Secondary | ICD-10-CM | POA: Diagnosis not present

## 2016-04-29 DIAGNOSIS — M5126 Other intervertebral disc displacement, lumbar region: Secondary | ICD-10-CM | POA: Diagnosis not present

## 2016-04-29 DIAGNOSIS — S32030A Wedge compression fracture of third lumbar vertebra, initial encounter for closed fracture: Secondary | ICD-10-CM

## 2016-05-01 ENCOUNTER — Encounter: Payer: Self-pay | Admitting: Family Medicine

## 2016-05-01 ENCOUNTER — Ambulatory Visit (INDEPENDENT_AMBULATORY_CARE_PROVIDER_SITE_OTHER): Payer: Medicare Other | Admitting: Family Medicine

## 2016-05-01 VITALS — BP 129/82 | HR 104 | Temp 98.6°F | Resp 16 | Ht 64.0 in | Wt 147.0 lb

## 2016-05-01 DIAGNOSIS — M810 Age-related osteoporosis without current pathological fracture: Secondary | ICD-10-CM

## 2016-05-01 LAB — MAGNESIUM: Magnesium: 2.4 mg/dL (ref 1.5–2.5)

## 2016-05-01 LAB — COMPLETE METABOLIC PANEL WITH GFR
ALT: 18 U/L (ref 6–29)
AST: 23 U/L (ref 10–35)
Albumin: 4.2 g/dL (ref 3.6–5.1)
Alkaline Phosphatase: 73 U/L (ref 33–130)
BUN: 12 mg/dL (ref 7–25)
CALCIUM: 9.4 mg/dL (ref 8.6–10.4)
CHLORIDE: 102 mmol/L (ref 98–110)
CO2: 27 mmol/L (ref 20–31)
CREATININE: 0.93 mg/dL (ref 0.50–0.99)
GFR, EST AFRICAN AMERICAN: 76 mL/min (ref >=60)
GFR, EST NON AFRICAN AMERICAN: 66 mL/min (ref >=60)
Glucose, Bld: 85 mg/dL (ref 65–99)
POTASSIUM: 5.4 mmol/L — AB (ref 3.5–5.3)
Sodium: 138 mmol/L (ref 135–146)
Total Bilirubin: 0.3 mg/dL (ref 0.2–1.2)
Total Protein: 7.3 g/dL (ref 6.1–8.1)

## 2016-05-01 MED ORDER — CALCIUM CARBONATE-VITAMIN D 500-200 MG-UNIT PO TABS: 2.0000 | ORAL_TABLET | Freq: Every day | ORAL | 11 refills | Status: DC

## 2016-05-01 NOTE — Assessment & Plan Note (Signed)
Will recheck calcium and magnesium today. Restart Oscal supplements at home to help boost calcium and plan for prolia later in the week.

## 2016-05-01 NOTE — Progress Notes (Signed)
Subjective:    Patient ID: Sara [name], female    DOB: 1951-07-26, 65 y.o.   MRN: [id number]  HPI: Sara [name] is a 65 y.o. female presenting on 05/01/2016 for Injections (Prolia)   HPI  Pt presents to recheck for rash under arm. It is much improved.  She is here for prolia injection as well. She has osteoporosis. However last calcium level was 8.5 and she is not taking calcium supplements anymore.   Past Medical History:  Diagnosis Date  . Depression   . GERD (gastroesophageal reflux disease)   . History of blood clots   . Osteoporosis   . Stomach ulcer   . Vascular disease    Sees Dr. Delana Meyer  . Vertigo    Last episode approx Aug 2015    Current Outpatient Prescriptions on File Prior to Visit  Medication Sig  . albuterol (PROVENTIL HFA;VENTOLIN HFA) 108 (90 Base) MCG/ACT inhaler Inhale 2 puffs into the lungs every 6 (six) hours as needed for wheezing or shortness of breath.  Marland Kitchen atorvastatin (LIPITOR) 10 MG tablet Take 1 tablet by mouth  daily  . buPROPion (WELLBUTRIN XL) 300 MG 24 hr tablet Take 1 tablet (300 mg total) by mouth daily.  . calcitonin, salmon, (MIACALCIN/FORTICAL) 200 UNIT/ACT nasal spray Place 1 spray into alternate nostrils daily.  . cephALEXin (KEFLEX) 500 MG capsule Take 1 capsule (500 mg total) by mouth 2 (two) times daily.  . clobetasol cream (TEMOVATE) [id number] % Apply 1 application topically 2 (two) times daily.  . clopidogrel (PLAVIX) 75 MG tablet Take 1 tablet (75 mg total) by mouth daily.  . Melatonin 1 MG TABS Take 1 tablet (1 mg total) by mouth Nightly.  . Multiple Vitamin (MULTIVITAMIN) capsule Take 1 capsule by mouth daily. PM  . nitroGLYCERIN (NITROSTAT) 0.4 MG SL tablet Place 1 tablet (0.4 mg total) under the tongue every 5 (five) minutes as needed for chest pain.  Marland Kitchen oxyCODONE-acetaminophen (PERCOCET) 10-325 MG tablet Take 1 tablet by mouth every 8 (eight) hours as needed for pain.  . pantoprazole (PROTONIX) 40 MG tablet Take 1 tablet (40 mg total)  by mouth 2 (two) times daily.  Marland Kitchen venlafaxine XR (EFFEXOR-XR) 75 MG 24 hr capsule Take 1 capsule (75 mg total) by mouth daily with breakfast.  . warfarin (COUMADIN) 5 MG tablet Take 1 tablet (5 mg total) by mouth daily at 6 PM.   No current facility-administered medications on file prior to visit.     Review of Systems  Constitutional: Negative for chills and fever.  HENT: Negative.   Respiratory: Negative for cough, chest tightness and wheezing.   Cardiovascular: Negative for chest pain and leg swelling.  Gastrointestinal: Negative for abdominal pain, constipation, diarrhea, nausea and vomiting.  Endocrine: Negative.  Negative for cold intolerance, heat intolerance, polydipsia, polyphagia and polyuria.  Genitourinary: Negative for difficulty urinating and dysuria.  Musculoskeletal: Negative.   Neurological: Negative for dizziness, light-headedness and numbness.  Psychiatric/Behavioral: Negative.    Per HPI unless specifically indicated above     Objective:    BP 129/82 (BP Location: Left Arm, Patient Position: Sitting, Cuff Size: Normal)   Pulse (!) 104   Temp 98.6 F (37 C) (Oral)   Resp 16   Ht 5\' 4"  (1.626 m)   Wt 147 lb (66.7 kg)   BMI 25.23 kg/m   Wt Readings from Last 3 Encounters:  05/01/16 147 lb (66.7 kg)  04/20/16 145 lb 9.6 oz (66 kg)  03/09/16 145 lb (65.8 kg)  Physical Exam  Constitutional: She is oriented to person, place, and time. She appears well-developed and well-nourished.  HENT:  Head: Normocephalic and atraumatic.  Neck: Neck supple.  Cardiovascular: Normal rate, regular rhythm and normal heart sounds.  Exam reveals no gallop and no friction rub.   No murmur heard. Pulmonary/Chest: Effort normal and breath sounds normal. She has no wheezes. She exhibits no tenderness.  Abdominal: Soft. Normal appearance and bowel sounds are normal. She exhibits no distension and no mass. There is no tenderness. There is no rebound and no guarding.    Musculoskeletal: Normal range of motion. She exhibits no edema or tenderness.  Lymphadenopathy:    She has no cervical adenopathy.  Neurological: She is alert and oriented to person, place, and time.  Skin: Skin is warm and dry.   Results for orders placed or performed during the hospital encounter of 02/05/16  MRSA PCR Screening  Result Value Ref Range   MRSA by PCR NEGATIVE NEGATIVE  NM Myocar Multi W/Spect W/Wall Motion / EF  Result Value Ref Range   Rest HR 93 bpm   Rest BP 121/72 mmHg   Exercise duration (min)  min   Exercise duration (sec)  sec   Estimated workload  METS   Peak HR 120 bpm   Peak BP 120/45 mmHg   MPHR  bpm   Percent HR 76 %   RPE     LV sys vol 16 mL   TID 0.50    LV dias vol 39 46 - 106 mL   LHR     SSS 6    SRS 1    SDS 0   Basic metabolic panel  Result Value Ref Range   Sodium 138 135 - 145 mmol/L   Potassium 3.6 3.5 - 5.1 mmol/L   Chloride 103 101 - 111 mmol/L   CO2 25 22 - 32 mmol/L   Glucose, Bld 106 (H) 65 - 99 mg/dL   BUN 12 6 - 20 mg/dL   Creatinine, Ser 1.16 (H) 0.44 - 1.00 mg/dL   Calcium 8.9 8.9 - 10.3 mg/dL   GFR calc non Af Amer 49 (L) >60 mL/min   GFR calc Af Amer 57 (L) >60 mL/min   Anion gap 10 5 - 15  CBC  Result Value Ref Range   WBC 12.2 (H) 3.6 - 11.0 K/uL   RBC 4.64 3.80 - 5.20 MIL/uL   Hemoglobin 14.7 12.0 - 16.0 g/dL   HCT 43.5 35.0 - 47.0 %   MCV 93.8 80.0 - 100.0 fL   MCH 31.6 26.0 - 34.0 pg   MCHC 33.7 32.0 - 36.0 g/dL   RDW 13.2 11.5 - 14.5 %   Platelets 200 150 - 440 K/uL  Protime-INR  Result Value Ref Range   Prothrombin Time 12.7 11.4 - 15.0 seconds   INR 0.93   Hepatic function panel  Result Value Ref Range   Total Protein 7.2 6.5 - 8.1 g/dL   Albumin 4.2 3.5 - 5.0 g/dL   AST 28 15 - 41 U/L   ALT 32 14 - 54 U/L   Alkaline Phosphatase 52 38 - 126 U/L   Total Bilirubin 0.1 (L) 0.3 - 1.2 mg/dL   Bilirubin, Direct <0.1 (L) 0.1 - 0.5 mg/dL   Indirect Bilirubin NOT CALCULATED 0.3 - 0.9 mg/dL  Basic  metabolic panel  Result Value Ref Range   Sodium 136 135 - 145 mmol/L   Potassium 3.9 3.5 - 5.1 mmol/L  Chloride 105 101 - 111 mmol/L   CO2 23 22 - 32 mmol/L   Glucose, Bld 104 (H) 65 - 99 mg/dL   BUN 11 6 - 20 mg/dL   Creatinine, Ser 0.96 0.44 - 1.00 mg/dL   Calcium 8.7 (L) 8.9 - 10.3 mg/dL   GFR calc non Af Amer >60 >60 mL/min   GFR calc Af Amer >60 >60 mL/min   Anion gap 8 5 - 15  CBC  Result Value Ref Range   WBC 10.9 3.6 - 11.0 K/uL   RBC 4.48 3.80 - 5.20 MIL/uL   Hemoglobin 14.0 12.0 - 16.0 g/dL   HCT 41.9 35.0 - 47.0 %   MCV 93.7 80.0 - 100.0 fL   MCH 31.2 26.0 - 34.0 pg   MCHC 33.3 32.0 - 36.0 g/dL   RDW 13.4 11.5 - 14.5 %   Platelets 185 150 - 440 K/uL  APTT  Result Value Ref Range   aPTT >160 (HH) 24 - 36 seconds  Heparin level (unfractionated)  Result Value Ref Range   Heparin Unfractionated 1.22 (H) 0.30 - 0.70 IU/mL  Heparin level (unfractionated)  Result Value Ref Range   Heparin Unfractionated 0.72 (H) 0.30 - 0.70 IU/mL  CBC  Result Value Ref Range   WBC 10.7 3.6 - 11.0 K/uL   RBC 4.54 3.80 - 5.20 MIL/uL   Hemoglobin 14.2 12.0 - 16.0 g/dL   HCT 42.8 35.0 - 47.0 %   MCV 94.2 80.0 - 100.0 fL   MCH 31.2 26.0 - 34.0 pg   MCHC 33.1 32.0 - 36.0 g/dL   RDW 13.7 11.5 - 14.5 %   Platelets 190 150 - 440 K/uL  Heparin level (unfractionated)  Result Value Ref Range   Heparin Unfractionated 0.65 0.30 - 0.70 IU/mL  CBC  Result Value Ref Range   WBC 12.6 (H) 3.6 - 11.0 K/uL   RBC 4.37 3.80 - 5.20 MIL/uL   Hemoglobin 13.9 12.0 - 16.0 g/dL   HCT 41.4 35.0 - 47.0 %   MCV 94.8 80.0 - 100.0 fL   MCH 31.7 26.0 - 34.0 pg   MCHC 33.4 32.0 - 36.0 g/dL   RDW 13.5 11.5 - 14.5 %   Platelets 190 150 - 440 K/uL  Heparin level (unfractionated)  Result Value Ref Range   Heparin Unfractionated 0.61 0.30 - 0.70 IU/mL  Heparin level (unfractionated)  Result Value Ref Range   Heparin Unfractionated 0.54 0.30 - 0.70 IU/mL  Troponin I  Result Value Ref Range   Troponin I  <0.03 <0.031 ng/mL  Heparin level (unfractionated)  Result Value Ref Range   Heparin Unfractionated 0.58 0.30 - 0.70 IU/mL  CBC  Result Value Ref Range   WBC 10.4 3.6 - 11.0 K/uL   RBC 4.36 3.80 - 5.20 MIL/uL   Hemoglobin 14.1 12.0 - 16.0 g/dL   HCT 41.8 35.0 - 47.0 %   MCV 96.0 80.0 - 100.0 fL   MCH 32.3 26.0 - 34.0 pg   MCHC 33.7 32.0 - 36.0 g/dL   RDW 13.5 11.5 - 14.5 %   Platelets 184 150 - 440 K/uL  Basic metabolic panel  Result Value Ref Range   Sodium 138 135 - 145 mmol/L   Potassium 4.5 3.5 - 5.1 mmol/L   Chloride 105 101 - 111 mmol/L   CO2 26 22 - 32 mmol/L   Glucose, Bld 107 (H) 65 - 99 mg/dL   BUN 12 6 - 20 mg/dL   Creatinine, Ser 0.97  0.44 - 1.00 mg/dL   Calcium 8.7 (L) 8.9 - 10.3 mg/dL   GFR calc non Af Amer >60 >60 mL/min   GFR calc Af Amer >60 >60 mL/min   Anion gap 7 5 - 15  Magnesium  Result Value Ref Range   Magnesium 2.1 1.7 - 2.4 mg/dL  Heparin level (unfractionated)  Result Value Ref Range   Heparin Unfractionated 0.51 0.30 - 0.70 IU/mL  CBC  Result Value Ref Range   WBC 9.4 3.6 - 11.0 K/uL   RBC 4.15 3.80 - 5.20 MIL/uL   Hemoglobin 13.3 12.0 - 16.0 g/dL   HCT 39.1 35.0 - 47.0 %   MCV 94.1 80.0 - 100.0 fL   MCH 32.0 26.0 - 34.0 pg   MCHC 34.0 32.0 - 36.0 g/dL   RDW 13.4 11.5 - 14.5 %   Platelets 162 150 - 440 K/uL  Basic metabolic panel  Result Value Ref Range   Sodium 136 135 - 145 mmol/L   Potassium 4.2 3.5 - 5.1 mmol/L   Chloride 105 101 - 111 mmol/L   CO2 24 22 - 32 mmol/L   Glucose, Bld 112 (H) 65 - 99 mg/dL   BUN 13 6 - 20 mg/dL   Creatinine, Ser 0.87 0.44 - 1.00 mg/dL   Calcium 8.6 (L) 8.9 - 10.3 mg/dL   GFR calc non Af Amer >60 >60 mL/min   GFR calc Af Amer >60 >60 mL/min   Anion gap 7 5 - 15  Magnesium  Result Value Ref Range   Magnesium 2.1 1.7 - 2.4 mg/dL  Heparin level (unfractionated) every 6 hours x 4 post-procedure  Result Value Ref Range   Heparin Unfractionated 0.38 0.30 - 0.70 IU/mL  Heparin level (unfractionated)  every 6 hours x 4 post-procedure  Result Value Ref Range   Heparin Unfractionated 0.36 0.30 - 0.70 IU/mL  Heparin level (unfractionated) every 6 hours x 4 post-procedure  Result Value Ref Range   Heparin Unfractionated 0.35 0.30 - 0.70 IU/mL  CBC every 6 hours x 4 post-procedure  Result Value Ref Range   WBC 11.6 (H) 3.6 - 11.0 K/uL   RBC 4.21 3.80 - 5.20 MIL/uL   Hemoglobin 13.4 12.0 - 16.0 g/dL   HCT 40.0 35.0 - 47.0 %   MCV 95.1 80.0 - 100.0 fL   MCH 31.9 26.0 - 34.0 pg   MCHC 33.6 32.0 - 36.0 g/dL   RDW 13.5 11.5 - 14.5 %   Platelets 145 (L) 150 - 440 K/uL  CBC every 6 hours x 4 post-procedure  Result Value Ref Range   WBC 13.9 (H) 3.6 - 11.0 K/uL   RBC 4.16 3.80 - 5.20 MIL/uL   Hemoglobin 13.1 12.0 - 16.0 g/dL   HCT 40.3 35.0 - 47.0 %   MCV 96.8 80.0 - 100.0 fL   MCH 31.5 26.0 - 34.0 pg   MCHC 32.6 32.0 - 36.0 g/dL   RDW 13.5 11.5 - 14.5 %   Platelets 113 (L) 150 - 440 K/uL  CBC every 6 hours x 4 post-procedure  Result Value Ref Range   WBC 11.9 (H) 3.6 - 11.0 K/uL   RBC 3.92 3.80 - 5.20 MIL/uL   Hemoglobin 12.4 12.0 - 16.0 g/dL   HCT 37.5 35.0 - 47.0 %   MCV 95.6 80.0 - 100.0 fL   MCH 31.6 26.0 - 34.0 pg   MCHC 33.0 32.0 - 36.0 g/dL   RDW 13.2 11.5 - 14.5 %   Platelets 103 (  L) 150 - 440 K/uL  Fibrinogen every 6 hours x 4 post-procedure  Result Value Ref Range   Fibrinogen 381 210 - 470 mg/dL  Fibrinogen every 6 hours x 4 post-procedure  Result Value Ref Range   Fibrinogen 186 (L) 210 - 470 mg/dL  Fibrinogen every 6 hours x 4 post-procedure  Result Value Ref Range   Fibrinogen 161 (L) 210 - 470 mg/dL  Glucose, capillary  Result Value Ref Range   Glucose-Capillary 77 65 - 99 mg/dL   Comment 1 Notify RN   Basic metabolic panel  Result Value Ref Range   Sodium 138 135 - 145 mmol/L   Potassium 4.3 3.5 - 5.1 mmol/L   Chloride 110 101 - 111 mmol/L   CO2 23 22 - 32 mmol/L   Glucose, Bld 116 (H) 65 - 99 mg/dL   BUN 10 6 - 20 mg/dL   Creatinine, Ser 0.67 0.44 -  1.00 mg/dL   Calcium 7.6 (L) 8.9 - 10.3 mg/dL   GFR calc non Af Amer >60 >60 mL/min   GFR calc Af Amer >60 >60 mL/min   Anion gap 5 5 - 15  CBC  Result Value Ref Range   WBC 10.6 3.6 - 11.0 K/uL   RBC 3.65 (L) 3.80 - 5.20 MIL/uL   Hemoglobin 11.8 (L) 12.0 - 16.0 g/dL   HCT 34.6 (L) 35.0 - 47.0 %   MCV 94.9 80.0 - 100.0 fL   MCH 32.2 26.0 - 34.0 pg   MCHC 33.9 32.0 - 36.0 g/dL   RDW 13.6 11.5 - 14.5 %   Platelets 73 (L) 150 - 440 K/uL  Basic metabolic panel  Result Value Ref Range   Sodium 139 135 - 145 mmol/L   Potassium 3.3 (L) 3.5 - 5.1 mmol/L   Chloride 108 101 - 111 mmol/L   CO2 26 22 - 32 mmol/L   Glucose, Bld 117 (H) 65 - 99 mg/dL   BUN <5 (L) 6 - 20 mg/dL   Creatinine, Ser 0.71 0.44 - 1.00 mg/dL   Calcium 8.0 (L) 8.9 - 10.3 mg/dL   GFR calc non Af Amer >60 >60 mL/min   GFR calc Af Amer >60 >60 mL/min   Anion gap 5 5 - 15  Magnesium  Result Value Ref Range   Magnesium 1.9 1.7 - 2.4 mg/dL  Protime-INR  Result Value Ref Range   Prothrombin Time 14.3 11.4 - 15.0 seconds   INR 1.09   CBC  Result Value Ref Range   WBC 13.7 (H) 3.6 - 11.0 K/uL   RBC 3.90 3.80 - 5.20 MIL/uL   Hemoglobin 12.3 12.0 - 16.0 g/dL   HCT 36.5 35.0 - 47.0 %   MCV 93.5 80.0 - 100.0 fL   MCH 31.6 26.0 - 34.0 pg   MCHC 33.8 32.0 - 36.0 g/dL   RDW 13.6 11.5 - 14.5 %   Platelets 88 (L) 150 - 440 K/uL  Basic metabolic panel  Result Value Ref Range   Sodium 139 135 - 145 mmol/L   Potassium 4.7 3.5 - 5.1 mmol/L   Chloride 109 101 - 111 mmol/L   CO2 25 22 - 32 mmol/L   Glucose, Bld 145 (H) 65 - 99 mg/dL   BUN 9 6 - 20 mg/dL   Creatinine, Ser 0.67 0.44 - 1.00 mg/dL   Calcium 8.7 (L) 8.9 - 10.3 mg/dL   GFR calc non Af Amer >60 >60 mL/min   GFR calc Af Amer >60 >60 mL/min  Anion gap 5 5 - 15  Magnesium  Result Value Ref Range   Magnesium 2.2 1.7 - 2.4 mg/dL  Protime-INR  Result Value Ref Range   Prothrombin Time 15.1 (H) 11.4 - 15.0 seconds   INR 1.17   Protime-INR  Result Value Ref  Range   Prothrombin Time 20.5 (H) 11.4 - 15.0 seconds   INR 1.76   CBC  Result Value Ref Range   WBC 16.2 (H) 3.6 - 11.0 K/uL   RBC 3.94 3.80 - 5.20 MIL/uL   Hemoglobin 12.7 12.0 - 16.0 g/dL   HCT 37.5 35.0 - 47.0 %   MCV 95.2 80.0 - 100.0 fL   MCH 32.2 26.0 - 34.0 pg   MCHC 33.9 32.0 - 36.0 g/dL   RDW 13.5 11.5 - 14.5 %   Platelets 116 (L) 150 - 440 K/uL  Basic metabolic panel  Result Value Ref Range   Sodium 140 135 - 145 mmol/L   Potassium 3.6 3.5 - 5.1 mmol/L   Chloride 108 101 - 111 mmol/L   CO2 27 22 - 32 mmol/L   Glucose, Bld 106 (H) 65 - 99 mg/dL   BUN 8 6 - 20 mg/dL   Creatinine, Ser 0.74 0.44 - 1.00 mg/dL   Calcium 8.5 (L) 8.9 - 10.3 mg/dL   GFR calc non Af Amer >60 >60 mL/min   GFR calc Af Amer >60 >60 mL/min   Anion gap 5 5 - 15  Magnesium  Result Value Ref Range   Magnesium 2.1 1.7 - 2.4 mg/dL  Protime-INR  Result Value Ref Range   Prothrombin Time 26.4 (H) 11.4 - 15.0 seconds   INR 2.46   ECHO COMPLETE  Result Value Ref Range   Weight 2,409.6 oz   Height 64 in   BP 106/85 mmHg   LV PW d 9.6 (A) 0.6 - 1.1 mm   FS 29 28 - 44 %   LA vol 28.2 mL   Ao-asc 34 cm   LA ID, A-P, ES 31 mm   IVS/LV PW RATIO, ED 1.07    AV pk vel 113 cm/s   AV Area VTI 2.01 cm2   MV Annulus VTI 23.9 cm   LA diam index 1.79 cm/m2   LA vol A4C 28.5 ml   Mean grad 2 mmHg   Area-P 1/2 2.89 cm2   E decel time 197 msec   LVOT diameter 18 mm   LVOT area 2.54 cm2   LVOT peak vel 89.2 cm/s   Ao pk vel .79 m/s   Peak grad 5 mmHg   MV pk E vel 67.4 m/s   P 1/2 time 76 ms   MV pk A vel 81 m/s   LA vol index 16.3 mL/m2   AV peak Index 1.16    MV M vel 58.1    MV Dec 197    LA diam end sys 31 mm      Assessment & Plan:   Problem List Items Addressed This Visit      Musculoskeletal and Integument   Osteoporosis, post-menopausal    Will recheck calcium and magnesium today. Restart Oscal supplements at home to help boost calcium and plan for prolia later in the week.         Relevant Medications   PROLIA 60 MG/ML SOLN injection   calcium-vitamin D (OSCAL WITH D) 500-200 MG-UNIT tablet    Other Visit Diagnoses    Osteoporosis    -  Primary  Relevant Medications   PROLIA 60 MG/ML SOLN injection   calcium-vitamin D (OSCAL WITH D) 500-200 MG-UNIT tablet   Other Relevant Orders   COMPLETE METABOLIC PANEL WITH GFR   Magnesium      Meds ordered this encounter  Medications  . DISCONTD: buPROPion (WELLBUTRIN XL) 150 MG 24 hr tablet    Sig: Take by mouth.  . DISCONTD: oxyCODONE-acetaminophen (PERCOCET/ROXICET) 5-325 MG tablet    Sig: Take by mouth.  . PROLIA 60 MG/ML SOLN injection  . FLUVIRIN SUSP  . calcium-vitamin D (OSCAL WITH D) 500-200 MG-UNIT tablet    Sig: Take 2 tablets by mouth daily with breakfast.    Dispense:  60 tablet    Refill:  11    Order Specific Question:   Supervising Provider    Answer:   Arlis Porta [id number]      Follow up plan: Return in about 1 week (around 05/08/2016) for Prolia. Marland Kitchen

## 2016-05-01 NOTE — Patient Instructions (Signed)
We will check labs to ensure your calcium levels are back to normal before we start the injection.

## 2016-05-02 ENCOUNTER — Ambulatory Visit (INDEPENDENT_AMBULATORY_CARE_PROVIDER_SITE_OTHER): Payer: Medicare Other

## 2016-05-02 VITALS — BP 126/89 | HR 101 | Temp 99.0°F | Resp 16 | Ht 64.0 in | Wt 148.4 lb

## 2016-05-02 DIAGNOSIS — M810 Age-related osteoporosis without current pathological fracture: Secondary | ICD-10-CM

## 2016-05-02 MED ORDER — DENOSUMAB 60 MG/ML ~~LOC~~ SOLN
60.0000 mg | Freq: Once | SUBCUTANEOUS | Status: AC
  Administered 2016-05-02: 60 mg via SUBCUTANEOUS

## 2016-05-02 NOTE — Progress Notes (Signed)
Pt was here to get Prolia 60mg /ml injection with lot [id number] exp date 06/19. Injection was given in Right arm pt tolerated injection well without complain pt's B/P was high and we recheck her Blood pressure physician is aware.

## 2016-05-11 ENCOUNTER — Encounter: Payer: Self-pay | Admitting: *Deleted

## 2016-05-16 ENCOUNTER — Other Ambulatory Visit: Payer: Self-pay | Admitting: Family Medicine

## 2016-05-16 ENCOUNTER — Other Ambulatory Visit: Payer: Medicare Other

## 2016-05-16 DIAGNOSIS — M810 Age-related osteoporosis without current pathological fracture: Secondary | ICD-10-CM

## 2016-05-16 NOTE — Progress Notes (Signed)
Cardiology Office Note   Date:  05/24/2016   ID:  Sara [name], DOB 1950/12/19, MRN [id number]  Referring Doctor:  Leata Mouse, NP   Cardiologist:   Wende Bushy, MD   Reason for consultation:  Follow-up after testing     History of Present Illness: Sara [name] is a 65 y.o. female who presents for follow-up after tests.  Patient was in the hospital for acute ischemia of the right lower extremity. She underwent thrombectomy and angioplasty care of vascular surgery. During that time in the hospital, she underwent stress testing which did not reveal any ischemia.  Since last visit, patient has been wearing out brace for compressed fracture of the vertebrae. She was deemed too high risk for surgical intervention. No chest pain, no shortness of breath. No PND, edema   ROS:  Please see the history of present illness. Aside from mentioned under HPI, all other systems are reviewed and negative.     Past Medical History:  Diagnosis Date  . Depression   . GERD (gastroesophageal reflux disease)   . History of blood clots   . Osteoporosis   . Stomach ulcer   . Vascular disease    Sees Dr. Delana Meyer  . Vertigo    Last episode approx Aug 2015    Past Surgical History:  Procedure Laterality Date  . ESOPHAGOGASTRODUODENOSCOPY N/A 12/21/2014   Procedure: ESOPHAGOGASTRODUODENOSCOPY (EGD);  Surgeon: Lucilla Lame, MD;  Location: [place];  Service: Gastroenterology;  Laterality: N/A;  . ESOPHAGOGASTRODUODENOSCOPY (EGD) WITH PROPOFOL N/A 02/08/2016   Procedure: ESOPHAGOGASTRODUODENOSCOPY (EGD) WITH PROPOFOL;  Surgeon: Lucilla Lame, MD;  Location: [facility];  Service: Endoscopy;  Laterality: N/A;  . HEMORROIDECTOMY  2014  . PERIPHERAL VASCULAR CATHETERIZATION N/A 02/09/2016   Procedure: Abdominal Aortogram w/Lower Extremity;  Surgeon: [name], MD;  Location: [place] [facility];  Service: Cardiovascular;  Laterality: N/A;  . PERIPHERAL VASCULAR CATHETERIZATION  Right 02/10/2016   Procedure: Lower Extremity Angiography;  Surgeon: [name], MD;  Location: [place] [facility];  Service: Cardiovascular;  Laterality: Right;  . TONSILECTOMY, ADENOIDECTOMY, BILATERAL MYRINGOTOMY AND TUBES  1968  . VASCULAR SURGERY  [phone]   Fem-Pop Bypass     reports that she quit smoking about 24 years ago. Her smoking use included Cigarettes. She has a 50.00 pack-year smoking history. She has never used smokeless tobacco. She reports that she does not drink alcohol or use drugs.   family history includes CVA in her mother; Cancer in her father; Colon cancer in her father; Heart attack in her mother; Heart disease in her maternal uncle. Premature CAD in the family  Current Outpatient Prescriptions  Medication Sig Dispense Refill  . atorvastatin (LIPITOR) 10 MG tablet Take 1 tablet by mouth  daily 90 tablet 3  . buPROPion (WELLBUTRIN XL) 300 MG 24 hr tablet Take 1 tablet (300 mg total) by mouth daily. 90 tablet 3  . calcium-vitamin D (OSCAL WITH D) 500-200 MG-UNIT tablet Take 2 tablets by mouth daily with breakfast. 60 tablet 11  . clobetasol cream (TEMOVATE) AB-123456789 % Apply 1 application topically 2 (two) times daily. 30 g 0  . clopidogrel (PLAVIX) 75 MG tablet Take 1 tablet (75 mg total) by mouth daily. 30 tablet 11  . FLUVIRIN SUSP Inject as directed once.     . Melatonin 1 MG TABS Take 1 tablet (1 mg total) by mouth Nightly.    . nitroGLYCERIN (NITROSTAT) 0.4 MG SL tablet Place 1 tablet (0.4 mg total)  under the tongue every 5 (five) minutes as needed for chest pain. 25 tablet 6  . pantoprazole (PROTONIX) 40 MG tablet Take 1 tablet (40 mg total) by mouth 2 (two) times daily. 180 tablet 3  . PROLIA 60 MG/ML SOLN injection Inject 60 mg into the skin every 6 (six) months.     . venlafaxine XR (EFFEXOR-XR) 75 MG 24 hr capsule Take 1 capsule (75 mg total) by mouth daily with breakfast. 90 capsule 3  . warfarin (COUMADIN) 5 MG tablet Take 1 tablet (5 mg total) by  mouth daily at 6 PM. 30 tablet 11  . albuterol (PROVENTIL HFA;VENTOLIN HFA) 108 (90 Base) MCG/ACT inhaler Inhale 2 puffs into the lungs every 6 (six) hours as needed for wheezing or shortness of breath. (Patient not taking: Reported on 05/24/2016) 1 Inhaler 0  . calcitonin, salmon, (MIACALCIN/FORTICAL) 200 UNIT/ACT nasal spray Place 1 spray into alternate nostrils daily. (Patient not taking: Reported on 05/24/2016) 3.7 mL 1  . metoprolol tartrate (LOPRESSOR) 25 MG tablet Take 0.5 tablets (12.5 mg total) by mouth 2 (two) times daily. 30 tablet 3  . oxyCODONE-acetaminophen (PERCOCET) 10-325 MG tablet Take 1 tablet by mouth every 8 (eight) hours as needed for pain. (Patient not taking: Reported on 05/24/2016) 30 tablet 0   No current facility-administered medications for this visit.     Allergies: Amoxicillin and Vicodin [hydrocodone-acetaminophen]    PHYSICAL EXAM: VS:  BP 124/80 (BP Location: Left Arm, Patient Position: Sitting, Cuff Size: Normal)   Pulse 98   Ht 5\' 4"  (1.626 m)   Wt 145 lb (65.8 kg)   BMI 24.89 kg/m  , Body mass index is 24.89 kg/m. Wt Readings from Last 3 Encounters:  05/24/16 145 lb (65.8 kg)  05/02/16 148 lb 6.4 oz (67.3 kg)  05/01/16 147 lb (66.7 kg)    GENERAL:  well developed, well nourished, obese, not in acute distress HEENT: normocephalic, pink conjunctivae, anicteric sclerae, no xanthelasma, normal dentition, oropharynx clear NECK:  no neck vein engorgement, JVP normal, no hepatojugular reflux, carotid upstroke brisk and symmetric, no bruit, no thyromegaly, no lymphadenopathy LUNGS:  good respiratory effort, clear to auscultation bilaterally CV:  PMI not displaced, no thrills, no lifts, S1 and S2 within normal limits, no palpable S3 or S4, no murmurs, no rubs, no gallops ABD:  Soft, nontender, nondistended, normoactive bowel sounds, no abdominal aortic bruit, no hepatomegaly, no splenomegaly MS: nontender back, no kyphosis, no scoliosis, no joint  deformities EXT:  2+ DP/PT pulses, no edema, no varicosities, no cyanosis, no clubbing SKIN: warm, nondiaphoretic, normal turgor, no ulcers NEUROPSYCH: alert, oriented to person, place, and time, sensory/motor grossly intact, normal mood, appropriate affect  Recent Labs: 02/14/2016: Hemoglobin 12.7; Platelets 116 05/16/2016: ALT 13; BUN 12; Creat 0.88; Magnesium 2.2; Potassium 4.5; Sodium 138   Lipid Panel    Component Value Date/Time   CHOL 164 09/21/2015 0916   TRIG 57 09/21/2015 0916   HDL 85 09/21/2015 0916   CHOLHDL 1.9 09/21/2015 0916   LDLCALC 68 09/21/2015 0916     Other studies Reviewed:  EKG:  The ekg from 01/27/2016 was personally reviewed by me and it revealed sinus rhythm, 92 BPM  Additional studies/ records that were reviewed personally reviewed by me today include:  Echo 02/06/2016: Left ventricle: The cavity size was normal. Wall thickness was  normal. Systolic function was normal. The estimated ejection  fraction was in the range of 60% to 65%. Wall motion was normal;  there were no regional wall  motion abnormalities. Doppler  parameters are consistent with abnormal left ventricular  relaxation (grade 1 diastolic dysfunction). - Mitral valve: There was mild regurgitation. - Right ventricle: Systolic function was normal. - Pulmonary arteries: Systolic pressure was within the normal  range.  Nuclear stress is 02/07/2016:  No T wave inversion was noted during stress.  There was no ST segment deviation noted during stress.  The study is normal.  This is a low risk study.  The left ventricular ejection fraction is normal (55-65%).   Event monitor 03/31/2016: Automatically detected events: Baseline rhythm was sinus rhythm On 03/19/2016, sinus rhythm with paroxysmal SVT or atrial tachycardia was noted. This was a less than 15 beat run. Atrial couplet noted as well. No symptom reported.  Manually detected events were all sinus rhythm, sinus rhythm  with artifact. These were all accidental pushes.  ASSESSMENT AND PLAN: Chest pain, concerning for angina. Multiple risk factors for CAD including peripheral vascular disease, carotid artery disease, age, previous smoking history, family history of premature CAD Echo revealed normal LVEF. Nuclear stress test did not reveal any evidence of ischemia. Low likelihood of clinically significant CAD based on this study. Patient reassured. Patient is on medical therapy anyway for PVD. Continue with clopidogrel, statin therapy. Patient tolerates clopidogrel better than aspirin.  Paroxysmal SVT or atrial tachycardia Estimated heart rate is in the 90s. Will likely benefit from addition of low-dose metoprolol. Continue with blood pressure not yet monitoring.  Hyperlipidemia LDL goal is less than 70  PVD Carotid artery disease Patient follows up with vascular surgery 02/10/2016:        1. Introduction catheter into right lower extremity 3rd order catheter placement  2. Contrast injection right lower extremity for distal runoff  3. Angiojet mechanical thrombectomy with the Omni device 4. Percutaneous transluminal angioplasty right tibioperoneal trunk with a 3 mm balloon  5. Infusion of TPA for thrombolysis   Occluded IMA/SMA Evidence of arterial thrombosis No arrhythmia noted while she was in the hospital. Recommend Event monitor.  Family history of premature CAD  Current medicines are reviewed at length with the patient today.  The patient does not have concerns regarding medicines.  Labs/ tests ordered today include:  No orders of the defined types were placed in this encounter.   I had a lengthy and detailed discussion with the patient regarding diagnoses, prognosis, diagnostic options, treatment options  and side effects of medications.   I counseled the patient on importance of lifestyle modification including heart  healthy diet, regular physical activity   Disposition:   FU with undersigned 6 months or sooner Signed, Wende Bushy, MD  05/24/2016 12:45 PM    [place]

## 2016-05-17 LAB — COMPREHENSIVE METABOLIC PANEL
ALBUMIN: 3.9 g/dL (ref 3.6–5.1)
ALK PHOS: 75 U/L (ref 33–130)
ALT: 13 U/L (ref 6–29)
AST: 18 U/L (ref 10–35)
BUN: 12 mg/dL (ref 7–25)
CHLORIDE: 106 mmol/L (ref 98–110)
CO2: 21 mmol/L (ref 20–31)
CREATININE: 0.88 mg/dL (ref 0.50–0.99)
Calcium: 8.8 mg/dL (ref 8.6–10.4)
Glucose, Bld: 103 mg/dL — ABNORMAL HIGH (ref 65–99)
POTASSIUM: 4.5 mmol/L (ref 3.5–5.3)
SODIUM: 138 mmol/L (ref 135–146)
TOTAL PROTEIN: 6.8 g/dL (ref 6.1–8.1)
Total Bilirubin: 0.3 mg/dL (ref 0.2–1.2)

## 2016-05-17 LAB — MAGNESIUM: Magnesium: 2.2 mg/dL (ref 1.5–2.5)

## 2016-05-24 ENCOUNTER — Encounter: Payer: Self-pay | Admitting: Cardiology

## 2016-05-24 ENCOUNTER — Ambulatory Visit (INDEPENDENT_AMBULATORY_CARE_PROVIDER_SITE_OTHER): Payer: Medicare Other | Admitting: Cardiology

## 2016-05-24 VITALS — BP 124/80 | HR 98 | Ht 64.0 in | Wt 145.0 lb

## 2016-05-24 DIAGNOSIS — I471 Supraventricular tachycardia: Secondary | ICD-10-CM

## 2016-05-24 DIAGNOSIS — Z8249 Family history of ischemic heart disease and other diseases of the circulatory system: Secondary | ICD-10-CM

## 2016-05-24 DIAGNOSIS — I739 Peripheral vascular disease, unspecified: Secondary | ICD-10-CM | POA: Diagnosis not present

## 2016-05-24 DIAGNOSIS — E784 Other hyperlipidemia: Secondary | ICD-10-CM

## 2016-05-24 DIAGNOSIS — E7849 Other hyperlipidemia: Secondary | ICD-10-CM

## 2016-05-24 MED ORDER — METOPROLOL TARTRATE 25 MG PO TABS: 12.5000 mg | ORAL_TABLET | Freq: Two times a day (BID) | ORAL | 3 refills | Status: DC

## 2016-05-24 NOTE — Patient Instructions (Signed)
Medication Instructions:  Your physician has recommended you make the following change in your medication:  1. START Metoprolol 25 mg 1/2 tablet twice daily.  Follow-Up: Your physician wants you to follow-up in: 6 months with Dr. Yvone Neu.  You will receive a reminder letter in the mail two months in advance. If you don't receive a letter, please call our office to schedule the follow-up appointment.  It was a pleasure seeing you today here in the office. Please do not hesitate to give [place] a call back if you have any further questions. [place], BSN

## 2016-05-25 ENCOUNTER — Ambulatory Visit (INDEPENDENT_AMBULATORY_CARE_PROVIDER_SITE_OTHER): Payer: Medicare Other | Admitting: Vascular Surgery

## 2016-05-25 DIAGNOSIS — I739 Peripheral vascular disease, unspecified: Secondary | ICD-10-CM

## 2016-05-25 DIAGNOSIS — Z86718 Personal history of other venous thrombosis and embolism: Secondary | ICD-10-CM

## 2016-05-25 LAB — PROTIME-INR: INR: 2.7 — AB (ref 0.9–1.1)

## 2016-05-25 NOTE — Progress Notes (Signed)
INR/Prothrombin Time  Patient is taking 2.5mg  and 5mg  alternating every other day. Her result today was  2.7

## 2016-05-26 ENCOUNTER — Encounter (INDEPENDENT_AMBULATORY_CARE_PROVIDER_SITE_OTHER): Payer: Self-pay

## 2016-06-07 ENCOUNTER — Other Ambulatory Visit: Payer: Self-pay | Admitting: Family Medicine

## 2016-06-07 DIAGNOSIS — K2100 Gastro-esophageal reflux disease with esophagitis, without bleeding: Secondary | ICD-10-CM

## 2016-06-07 DIAGNOSIS — F339 Major depressive disorder, recurrent, unspecified: Secondary | ICD-10-CM

## 2016-06-13 ENCOUNTER — Telehealth: Payer: Self-pay | Admitting: Cardiology

## 2016-06-13 NOTE — Telephone Encounter (Signed)
Pt c/o medication issue:  1. Name of Medication: Metoprolol   2. How are you currently taking this medication (dosage and times per day)?  One in the am and one in the pm  3. Are you having a reaction (difficulty breathing--STAT)? no  4. What is your medication issue? States since being on this medication that she's gotten two knots under her arm  She went to her PCP and they gave her an antibiotic and it went away  But it came back she is not sure what the pcp though it was will be call them again  But wants to know if this is connected  Please advise.

## 2016-06-13 NOTE — Telephone Encounter (Signed)
Patient states that she had 12-13 knots that came up under her arm. She went to her PCP and they prescribed a antibiotic and they went away. Now she has another one under her arm. She wanted to know if this could be from the metoprolol. Let her know this was not listed as a side effect and that if it went away with antibiotics it sounds like it may be something her primary care provider should evaluate again. She verbalized understanding of our conversation and had no further questions at this time. Let her know to give [place] a call back if she has any further needs.

## 2016-06-14 ENCOUNTER — Other Ambulatory Visit: Payer: Self-pay | Admitting: Nurse Practitioner

## 2016-06-14 DIAGNOSIS — Z1231 Encounter for screening mammogram for malignant neoplasm of breast: Secondary | ICD-10-CM

## 2016-06-15 ENCOUNTER — Other Ambulatory Visit (INDEPENDENT_AMBULATORY_CARE_PROVIDER_SITE_OTHER): Payer: Self-pay | Admitting: Vascular Surgery

## 2016-06-15 ENCOUNTER — Ambulatory Visit (INDEPENDENT_AMBULATORY_CARE_PROVIDER_SITE_OTHER): Payer: Medicare Other

## 2016-06-15 DIAGNOSIS — I739 Peripheral vascular disease, unspecified: Secondary | ICD-10-CM

## 2016-06-16 ENCOUNTER — Other Ambulatory Visit
Admission: RE | Admit: 2016-06-16 | Discharge: 2016-06-16 | Disposition: A | Discharge status: Home / Self Care | Payer: Medicare Other | Source: Ambulatory Visit | Attending: Vascular Surgery | Admitting: Vascular Surgery

## 2016-06-16 DIAGNOSIS — I739 Peripheral vascular disease, unspecified: Secondary | ICD-10-CM

## 2016-06-16 LAB — PROTIME-INR
INR: 2.63
Prothrombin Time: 28.6 seconds — ABNORMAL HIGH (ref 11.4–15.2)

## 2016-06-19 ENCOUNTER — Telehealth (INDEPENDENT_AMBULATORY_CARE_PROVIDER_SITE_OTHER): Payer: Self-pay

## 2016-06-19 ENCOUNTER — Other Ambulatory Visit (INDEPENDENT_AMBULATORY_CARE_PROVIDER_SITE_OTHER): Payer: Self-pay | Admitting: Vascular Surgery

## 2016-06-19 DIAGNOSIS — I739 Peripheral vascular disease, unspecified: Secondary | ICD-10-CM

## 2016-06-19 NOTE — Telephone Encounter (Signed)
She is fine. Keep taking her current dosage and redraw in two weeks. I will put the order in.

## 2016-06-19 NOTE — Telephone Encounter (Signed)
Patient called wanting the results of her INR taken on 06/16/16. INR is 2.6 she needs to know if any changes and when to have it redrawn.

## 2016-06-19 NOTE — Telephone Encounter (Signed)
Spoke with the patient and let her know what Hezzie Bump P.A. Said regarding her INR. See previous note below.

## 2016-07-03 ENCOUNTER — Other Ambulatory Visit
Admission: RE | Admit: 2016-07-03 | Discharge: 2016-07-03 | Disposition: A | Discharge status: Home / Self Care | Payer: Medicare Other | Source: Ambulatory Visit | Attending: Vascular Surgery | Admitting: Vascular Surgery

## 2016-07-03 ENCOUNTER — Other Ambulatory Visit: Payer: Self-pay

## 2016-07-03 DIAGNOSIS — I739 Peripheral vascular disease, unspecified: Secondary | ICD-10-CM | POA: Diagnosis present

## 2016-07-03 LAB — PROTIME-INR
INR: 1.77
PROTHROMBIN TIME: 20.8 s — AB (ref 11.4–15.2)

## 2016-07-03 MED ORDER — METOPROLOL TARTRATE 25 MG PO TABS: 12.5000 mg | ORAL_TABLET | Freq: Two times a day (BID) | ORAL | 3 refills | Status: DC

## 2016-07-05 ENCOUNTER — Telehealth (INDEPENDENT_AMBULATORY_CARE_PROVIDER_SITE_OTHER): Payer: Self-pay | Admitting: Vascular Surgery

## 2016-07-05 ENCOUNTER — Other Ambulatory Visit (INDEPENDENT_AMBULATORY_CARE_PROVIDER_SITE_OTHER): Payer: Self-pay | Admitting: Vascular Surgery

## 2016-07-05 DIAGNOSIS — I739 Peripheral vascular disease, unspecified: Secondary | ICD-10-CM

## 2016-07-05 NOTE — Telephone Encounter (Signed)
Pt stated she went and had her INR checked 11.13.17 and need to know if she need to adjust her medication. Please call [phone].

## 2016-07-05 NOTE — Telephone Encounter (Signed)
Patient receive results and advise to take 5mg  for a week and get inr recheck in a week

## 2016-07-05 NOTE — Telephone Encounter (Signed)
DUPLICATE

## 2016-07-10 ENCOUNTER — Telehealth (INDEPENDENT_AMBULATORY_CARE_PROVIDER_SITE_OTHER): Payer: Self-pay

## 2016-07-10 ENCOUNTER — Other Ambulatory Visit
Admission: RE | Admit: 2016-07-10 | Discharge: 2016-07-10 | Disposition: A | Discharge status: Home / Self Care | Payer: Medicare Other | Source: Ambulatory Visit | Attending: Vascular Surgery | Admitting: Vascular Surgery

## 2016-07-10 ENCOUNTER — Other Ambulatory Visit (INDEPENDENT_AMBULATORY_CARE_PROVIDER_SITE_OTHER): Payer: Self-pay | Admitting: Vascular Surgery

## 2016-07-10 DIAGNOSIS — I739 Peripheral vascular disease, unspecified: Secondary | ICD-10-CM | POA: Diagnosis present

## 2016-07-10 LAB — PROTIME-INR
INR: 2.31
Prothrombin Time: 25.8 seconds — ABNORMAL HIGH (ref 11.4–15.2)

## 2016-07-10 NOTE — Telephone Encounter (Signed)
I called patient to give her the results for inr 2.31 and told her to return in a month to recheck inr per [place]

## 2016-07-10 NOTE — Progress Notes (Unsigned)
INR

## 2016-08-01 ENCOUNTER — Ambulatory Visit
Admission: RE | Admit: 2016-08-01 | Discharge: 2016-08-01 | Disposition: A | Discharge status: Home / Self Care | Payer: Medicare Other | Source: Ambulatory Visit | Attending: Nurse Practitioner | Admitting: Nurse Practitioner

## 2016-08-01 DIAGNOSIS — Z1231 Encounter for screening mammogram for malignant neoplasm of breast: Secondary | ICD-10-CM | POA: Diagnosis present

## 2016-08-02 ENCOUNTER — Other Ambulatory Visit
Admission: RE | Admit: 2016-08-02 | Discharge: 2016-08-02 | Disposition: A | Discharge status: Home / Self Care | Payer: Medicare Other | Source: Ambulatory Visit | Attending: Vascular Surgery | Admitting: Vascular Surgery

## 2016-08-02 DIAGNOSIS — I739 Peripheral vascular disease, unspecified: Secondary | ICD-10-CM | POA: Insufficient documentation

## 2016-08-02 LAB — PROTIME-INR
INR: 2.07
PROTHROMBIN TIME: 23.6 s — AB (ref 11.4–15.2)

## 2016-08-03 ENCOUNTER — Other Ambulatory Visit (INDEPENDENT_AMBULATORY_CARE_PROVIDER_SITE_OTHER): Payer: Self-pay | Admitting: Vascular Surgery

## 2016-08-03 ENCOUNTER — Telehealth (INDEPENDENT_AMBULATORY_CARE_PROVIDER_SITE_OTHER): Payer: Self-pay

## 2016-08-03 DIAGNOSIS — I739 Peripheral vascular disease, unspecified: Secondary | ICD-10-CM

## 2016-08-03 NOTE — Telephone Encounter (Signed)
Patient was given information regarding her INR and when to recheck. See note below.

## 2016-08-03 NOTE — Telephone Encounter (Signed)
Patient called stating she had her INR drawn and wants to know any changes and when to redraw labs.

## 2016-08-03 NOTE — Telephone Encounter (Signed)
Her INR was 2.0 she can increase to 7.5mg  daily and check in two weeks. Ill put the order in. Thanks.

## 2016-08-10 ENCOUNTER — Other Ambulatory Visit
Admission: RE | Admit: 2016-08-10 | Discharge: 2016-08-10 | Disposition: A | Discharge status: Home / Self Care | Payer: Medicare Other | Source: Ambulatory Visit | Attending: Vascular Surgery | Admitting: Vascular Surgery

## 2016-08-10 ENCOUNTER — Other Ambulatory Visit (INDEPENDENT_AMBULATORY_CARE_PROVIDER_SITE_OTHER): Payer: Self-pay | Admitting: Vascular Surgery

## 2016-08-10 ENCOUNTER — Telehealth (INDEPENDENT_AMBULATORY_CARE_PROVIDER_SITE_OTHER): Payer: Self-pay

## 2016-08-10 DIAGNOSIS — I739 Peripheral vascular disease, unspecified: Secondary | ICD-10-CM | POA: Diagnosis present

## 2016-08-10 DIAGNOSIS — I779 Disorder of arteries and arterioles, unspecified: Secondary | ICD-10-CM

## 2016-08-10 LAB — PROTIME-INR
INR: 2.3
Prothrombin Time: 25.7 seconds — ABNORMAL HIGH (ref 11.4–15.2)

## 2016-08-10 NOTE — Telephone Encounter (Signed)
Called the patient back with her INR results, which it was a 2.3. Per Maudie Mercury, she is to continue the same dosage, and have her levels checked again in three weeks. Then the patient went on to say that she has been dealing with a nose bleed that she cant get under control, and she wanted advise on what to do. Per Dr. Delana Meyer, she is to use saline mist to re-moisten her nasal passages, and if this doesn't work, he stated for her to go to the ER.

## 2016-08-29 ENCOUNTER — Other Ambulatory Visit (INDEPENDENT_AMBULATORY_CARE_PROVIDER_SITE_OTHER): Payer: Self-pay | Admitting: Vascular Surgery

## 2016-08-29 DIAGNOSIS — I739 Peripheral vascular disease, unspecified: Secondary | ICD-10-CM

## 2016-08-30 ENCOUNTER — Telehealth (INDEPENDENT_AMBULATORY_CARE_PROVIDER_SITE_OTHER): Payer: Self-pay

## 2016-08-30 ENCOUNTER — Other Ambulatory Visit (INDEPENDENT_AMBULATORY_CARE_PROVIDER_SITE_OTHER): Payer: Self-pay | Admitting: Vascular Surgery

## 2016-08-30 ENCOUNTER — Telehealth (INDEPENDENT_AMBULATORY_CARE_PROVIDER_SITE_OTHER): Payer: Self-pay | Admitting: Vascular Surgery

## 2016-08-30 ENCOUNTER — Other Ambulatory Visit
Admission: RE | Admit: 2016-08-30 | Discharge: 2016-08-30 | Disposition: A | Discharge status: Home / Self Care | Payer: Medicare Other | Source: Ambulatory Visit | Attending: Vascular Surgery | Admitting: Vascular Surgery

## 2016-08-30 DIAGNOSIS — I739 Peripheral vascular disease, unspecified: Secondary | ICD-10-CM

## 2016-08-30 LAB — PROTIME-INR
INR: 6.29
PROTHROMBIN TIME: 57.5 s — AB (ref 11.4–15.2)

## 2016-08-30 NOTE — Telephone Encounter (Signed)
-----   Message from Sela Hua, PA-C sent at 08/30/2016  1:07 PM EST ----- Regarding: Everardo All I spoke with Dr. Delana Meyer about critical INR on patient. Patient to stop coumadin. May restart at 1/2 dose on Saturday. Re-check INR (i placed order) on Monday.

## 2016-08-30 NOTE — Telephone Encounter (Signed)
Error

## 2016-08-30 NOTE — Telephone Encounter (Signed)
I called the patient and left a message giving her information regarding taking her Coumadin and INR, I asked that she call me if she has any questions. See note previous.

## 2016-09-01 ENCOUNTER — Encounter: Payer: Self-pay | Admitting: Emergency Medicine

## 2016-09-01 ENCOUNTER — Emergency Department
Admission: EM | Admit: 2016-09-01 | Discharge: 2016-09-01 | Disposition: A | Discharge status: Home / Self Care | Payer: Medicare Other | Attending: Emergency Medicine | Admitting: Emergency Medicine

## 2016-09-01 ENCOUNTER — Ambulatory Visit
Admission: EM | Admit: 2016-09-01 | Discharge: 2016-09-01 | Disposition: A | Discharge status: Other Acute Inpatient Hospital | Payer: Medicare Other | Source: Home / Self Care | Attending: Family Medicine | Admitting: Family Medicine

## 2016-09-01 ENCOUNTER — Emergency Department: Payer: Medicare Other

## 2016-09-01 DIAGNOSIS — Z79899 Other long term (current) drug therapy: Secondary | ICD-10-CM | POA: Insufficient documentation

## 2016-09-01 DIAGNOSIS — Z7901 Long term (current) use of anticoagulants: Secondary | ICD-10-CM | POA: Diagnosis not present

## 2016-09-01 DIAGNOSIS — I739 Peripheral vascular disease, unspecified: Secondary | ICD-10-CM

## 2016-09-01 DIAGNOSIS — L03113 Cellulitis of right upper limb: Secondary | ICD-10-CM | POA: Insufficient documentation

## 2016-09-01 DIAGNOSIS — R791 Abnormal coagulation profile: Secondary | ICD-10-CM | POA: Diagnosis not present

## 2016-09-01 DIAGNOSIS — M79601 Pain in right arm: Secondary | ICD-10-CM

## 2016-09-01 DIAGNOSIS — M7989 Other specified soft tissue disorders: Secondary | ICD-10-CM

## 2016-09-01 DIAGNOSIS — Z86718 Personal history of other venous thrombosis and embolism: Secondary | ICD-10-CM | POA: Diagnosis not present

## 2016-09-01 DIAGNOSIS — Z87891 Personal history of nicotine dependence: Secondary | ICD-10-CM | POA: Diagnosis not present

## 2016-09-01 DIAGNOSIS — M79631 Pain in right forearm: Secondary | ICD-10-CM | POA: Diagnosis present

## 2016-09-01 LAB — PROTIME-INR
INR: 3.69
Prothrombin Time: 37.5 seconds — ABNORMAL HIGH (ref 11.4–15.2)

## 2016-09-01 MED ORDER — CEPHALEXIN 500 MG PO CAPS
500.0000 mg | ORAL_CAPSULE | Freq: Once | ORAL | Status: AC
  Administered 2016-09-01: 500 mg via ORAL
  Filled 2016-09-01: qty 1

## 2016-09-01 MED ORDER — CEPHALEXIN 500 MG PO CAPS: 500.0000 mg | ORAL_CAPSULE | Freq: Three times a day (TID) | ORAL | 0 refills | Status: DC

## 2016-09-01 MED ORDER — TRAMADOL HCL 50 MG PO TABS: 50.0000 mg | ORAL_TABLET | Freq: Four times a day (QID) | ORAL | 0 refills | Status: AC | PRN

## 2016-09-01 MED ORDER — FLUCONAZOLE 150 MG PO TABS: 150.0000 mg | ORAL_TABLET | Freq: Every day | ORAL | 0 refills | Status: DC

## 2016-09-01 NOTE — ED Notes (Signed)
Right arm 29 inches round 21 inches length 23 1/2 inches around wrist Left arm 29 inches round 24 inches length 25 inches around wrist

## 2016-09-01 NOTE — ED Triage Notes (Signed)
Pt ambulatory to triage in NAD, reports right arm swelling and pain, swelling noted, tenderness upon palpation, right arm discolored as well.  Pt report coumadin and today INR 3.59, but on Wednesday was 6.5

## 2016-09-01 NOTE — ED Provider Notes (Signed)
[facility] Emergency Department Provider Note  Time seen: 9:03 PM  I have reviewed the triage vital signs and the nursing notes.   HISTORY  Chief Complaint Arm Pain (right)    HPI Sara [name] is a 66 y.o. female with a past medical history of depression, gastric reflux, peripheral arterial disease on Coumadin who presents to the emergency department for right arm pain referred from urgent care. According to the patient for the past 4 days she has been experiencing pain in the right forearm especially with movement of her hand. Patient's INR was significantly elevated on routine blood work Wednesday greater 6. INR today is 3.7. Patient was to restart Coumadin tomorrow. Patient was seen at urgent care and referred to the ER for further evaluation. Denies any fever.   Past Medical History:  Diagnosis Date  . Depression   . GERD (gastroesophageal reflux disease)   . History of blood clots   . Osteoporosis   . Stomach ulcer   . Vascular disease    Sees Dr. Delana Meyer  . Vertigo    Last episode approx Aug 2015    Patient Active Problem List   Diagnosis Date Noted  . Compression fracture of L3 lumbar vertebra ([place]) 04/20/2016  . PVD (peripheral vascular disease) ([place]) 02/24/2016  . Family history of premature CAD 02/24/2016  . Anticoagulated on warfarin 02/21/2016  . Gastritis   . Other specified diseases of esophagus   . Hiatal hernia   . Gastritis and gastroduodenitis   . Ischemia of lower extremity   . Arterial occlusion ([place])   . Atherosclerosis of aorta ([place])   . History of smoking   . Hyperlipidemia   . Pain in the chest   . 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 Carotid artery stenosis 01/04/2016  . Vertigo, benign paroxysmal 12/28/2015  . Clinical depression 09/06/2015  . History of alcoholism ([place]) 09/06/2015  . Angiopathy, 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[place]) 09/06/2015  . Peripheral vascular disease ([place]) 09/06/2015  .  Acute non-recurrent maxillary sinusitis 07/14/2015  . Vaginal pruritus 05/26/2015  . Chronic recurrent major depressive disorder ([place]) 03/09/2015  . 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 Peripheral blood vessel disorder ([place]) 03/09/2015  . Acid reflux 03/09/2015  . GERD (gastroesophageal reflux disease) 12/21/2014  . Carotid artery narrowing 01/28/2014  . Peripheral arterial occlusive disease ([place]) 06/08/2011    Past Surgical History:  Procedure Laterality Date  . BREAST CYST ASPIRATION Left   . ESOPHAGOGASTRODUODENOSCOPY N/A 12/21/2014   Procedure: ESOPHAGOGASTRODUODENOSCOPY (EGD);  Surgeon: Lucilla Lame, MD;  Location: [place];  Service: Gastroenterology;  Laterality: N/A;  . ESOPHAGOGASTRODUODENOSCOPY (EGD) WITH PROPOFOL N/A 02/08/2016   Procedure: ESOPHAGOGASTRODUODENOSCOPY (EGD) WITH PROPOFOL;  Surgeon: Lucilla Lame, MD;  Location: [facility];  Service: Endoscopy;  Laterality: N/A;  . HEMORROIDECTOMY  2014  . PERIPHERAL VASCULAR CATHETERIZATION N/A 02/09/2016   Procedure: Abdominal Aortogram w/Lower Extremity;  Surgeon: [name], MD;  Location: [place] [facility];  Service: Cardiovascular;  Laterality: N/A;  . PERIPHERAL VASCULAR CATHETERIZATION Right 02/10/2016   Procedure: Lower Extremity Angiography;  Surgeon: [name], MD;  Location: [place] [facility];  Service: Cardiovascular;  Laterality: Right;  . TONSILECTOMY, ADENOIDECTOMY, BILATERAL MYRINGOTOMY AND TUBES  1968  . VASCULAR SURGERY  [phone]   Fem-Pop Bypass    Prior to Admission medications   Medication Sig Start Date End Date Taking? Authorizing Provider  albuterol (PROVENTIL HFA;VENTOLIN HFA) 108 (90 Base) MCG/ACT inhaler Inhale 2 puffs into the  lungs every 6 (six) hours as needed for wheezing or shortness of breath. 01/31/16   Amy Overton Mam, NP  atorvastatin (LIPITOR) 10 MG tablet Take 1 tablet by mouth  daily 02/23/16   Amy Overton Mam, NP  buPROPion (WELLBUTRIN XL) 300 MG 24  hr tablet Take 1 tablet (300 mg total) by mouth daily. 04/20/16   Amy Overton Mam, NP  calcitonin, salmon, (MIACALCIN/FORTICAL) 200 UNIT/ACT nasal spray Place 1 spray into alternate nostrils daily. 04/28/16   Amy Overton Mam, NP  calcium-vitamin D (OSCAL WITH D) 500-200 MG-UNIT tablet Take 2 tablets by mouth daily with breakfast. 05/01/16   Amy Overton Mam, NP  clobetasol cream (TEMOVATE) [id number] % Apply 1 application topically 2 (two) times daily. 12/06/15   Amy Overton Mam, NP  clopidogrel (PLAVIX) 75 MG tablet Take 1 tablet (75 mg total) by mouth daily. 02/15/16   [name], MD  FLUVIRIN SUSP Inject as directed once.  04/28/16   Historical Provider, MD  Melatonin 1 MG TABS Take 1 tablet (1 mg total) by mouth Nightly. 12/28/15   Amy Overton Mam, NP  metoprolol tartrate (LOPRESSOR) 25 MG tablet Take 0.5 tablets (12.5 mg total) by mouth 2 (two) times daily. 07/03/16 10/01/16  Wende Bushy, MD  nitroGLYCERIN (NITROSTAT) 0.4 MG SL tablet Place 1 tablet (0.4 mg total) under the tongue every 5 (five) minutes as needed for chest pain. 01/27/16   Wende Bushy, MD  oxyCODONE-acetaminophen (PERCOCET) 10-325 MG tablet Take 1 tablet by mouth every 8 (eight) hours as needed for pain. 04/28/16   Amy Lauren Krebs, NP  pantoprazole (PROTONIX) 40 MG tablet TAKE 1 TABLET BY MOUTH TWO  TIMES DAILY 06/07/16   Amy Overton Mam, NP  PROLIA 60 MG/ML SOLN injection Inject 60 mg into the skin every 6 (six) months.  04/27/16   Historical Provider, MD  venlafaxine XR (EFFEXOR-XR) 75 MG 24 hr capsule TAKE 1 CAPSULE BY MOUTH  DAILY WITH BREAKFAST 06/07/16   Amy Overton Mam, NP  warfarin (COUMADIN) 5 MG tablet Take 1 tablet (5 mg total) by mouth daily at 6 PM. 02/15/16   [name], MD    Allergies  Allergen Reactions  . Amoxicillin Other (See Comments)    Yeast infection  . Vicodin [Hydrocodone-Acetaminophen] Hives and Rash    Family History  Problem Relation Age of Onset  . CVA Mother   . Heart attack Mother   .  Cancer Father     colon cancer  . Colon cancer Father   . Heart disease Maternal Uncle   . Breast cancer Neg Hx     Social History Social History  Substance Use Topics  . Smoking status: Former Smoker    Packs/day: 2.00    Years: 25.00    Types: Cigarettes    Quit date: 08/22/1991  . Smokeless tobacco: Never Used  . Alcohol use No    Review of Systems Constitutional: Negative for fever. Cardiovascular: Negative for chest pain. Respiratory: Negative for shortness of breath. Gastrointestinal: Negative for abdominal pain Skin: Mild redness of right arm. Neurological: Negative for headache 10-point ROS otherwise negative.  ____________________________________________   PHYSICAL EXAM:  VITAL SIGNS: ED Triage Vitals [09/01/16 1910]  Enc Vitals Group     BP 131/85     Pulse Rate (!) 113     Resp 18     Temp 98.6 F (37 C)     Temp Source Oral     SpO2 96 %     Weight  142 lb (64.4 kg)     Height 5\' 3"  (1.6 m)     Head Circumference      Peak Flow      Pain Score 0     Pain Loc      Pain Edu?      Excl. in [place]?    Constitutional: Alert and oriented. Well appearing and in no distress. Eyes: Normal exam ENT   Head: Normocephalic and atraumatic.   Mouth/Throat: Mucous membranes are moist. Cardiovascular: Normal rate, regular rhythm. No murmur Respiratory: Normal respiratory effort without tachypnea nor retractions. Breath sounds are clear  Gastrointestinal: Soft and nontender. No distention.   Musculoskeletal: Mild swelling of the distal dorsal forearm with mild erythema and mild to mod tenderness over this area.  Very soft compartments, no concern for compartment syndrome.  N/V intact distally.   Neurologic:  Normal speech and language. No gross focal neurologic deficits. Skin:  Skin is warm, dry and intact.  Psychiatric: Mood and affect are normal. Speech and behavior are normal.   ____________________________________________   RADIOLOGY  [place] neg for  DVT  ____________________________________________   INITIAL IMPRESSION / ASSESSMENT AND PLAN / ED COURSE  Pertinent labs & imaging results that were available during my care of the patient were reviewed by me and considered in my medical decision making (see chart for details).  The patient presents to the emergency department with right arm discomfort for the past for 5 days. Patient's examination shows very slight swelling consistent with possible mild hematoma, very soft compartments, slight erythema. Ultrasound is negative for DVT. No suspicion for compartment syndrome. Good range of motion in all hand muscles, good grip strength. Patient does state mild discomfort with movement of the hand, but is able to move hand in all directions. We will discharge the patient on a short course of Keflex. Patient will hold off restarting her Coumadin until Saturday. Patient will follow up on Monday to recheck her INR. Discussed return precautions for any worsening discomfort. Patient agreeable to plan.  ____________________________________________   FINAL CLINICAL IMPRESSION(S) / ED DIAGNOSES  Hematoma Cellulitis    Harvest Dark, MD 09/01/16 2125

## 2016-09-01 NOTE — ED Provider Notes (Signed)
CSN: [id number]     Arrival date & time 09/01/16  1436 History   First MD Initiated Contact with Patient 09/01/16 1732     Chief Complaint  Patient presents with  . Arm Pain   (Consider location/radiation/quality/duration/timing/severity/associated sxs/prior Treatment) HPI This a 66 year old female who presents with right arm pain that is worsened over the last 5 days. States at the present time she can hardly move her arm or hand without severe pain. She has been on Coumadin since June for hypercoagulability and her last INR on 08/31/2015 was 6.29 is noticed that she has bruising on the arm. There is been no history of any trauma to the arm that she is aware of. INR today was performed showing it to be 3.69. She was told to take a half of a 7.5 mg Coumadin starting tomorrow. Over the 5 days the arm has been worsening with severely painful with any movement of her fingers.       Past Medical History:  Diagnosis Date  . Depression   . GERD (gastroesophageal reflux disease)   . History of blood clots   . Osteoporosis   . Stomach ulcer   . Vascular disease    Sees Dr. Delana Meyer  . Vertigo    Last episode approx Aug 2015   Past Surgical History:  Procedure Laterality Date  . BREAST CYST ASPIRATION Left   . ESOPHAGOGASTRODUODENOSCOPY N/A 12/21/2014   Procedure: ESOPHAGOGASTRODUODENOSCOPY (EGD);  Surgeon: Lucilla Lame, MD;  Location: [place];  Service: Gastroenterology;  Laterality: N/A;  . ESOPHAGOGASTRODUODENOSCOPY (EGD) WITH PROPOFOL N/A 02/08/2016   Procedure: ESOPHAGOGASTRODUODENOSCOPY (EGD) WITH PROPOFOL;  Surgeon: Lucilla Lame, MD;  Location: [facility];  Service: Endoscopy;  Laterality: N/A;  . HEMORROIDECTOMY  2014  . PERIPHERAL VASCULAR CATHETERIZATION N/A 02/09/2016   Procedure: Abdominal Aortogram w/Lower Extremity;  Surgeon: [name], MD;  Location: [place] [facility];  Service: Cardiovascular;  Laterality: N/A;  . PERIPHERAL VASCULAR CATHETERIZATION  Right 02/10/2016   Procedure: Lower Extremity Angiography;  Surgeon: [name], MD;  Location: [place] [facility];  Service: Cardiovascular;  Laterality: Right;  . TONSILECTOMY, ADENOIDECTOMY, BILATERAL MYRINGOTOMY AND TUBES  1968  . VASCULAR SURGERY  [id number]   Fem-Pop Bypass   Family History  Problem Relation Age of Onset  . CVA Mother   . Heart attack Mother   . Cancer Father     colon cancer  . Colon cancer Father   . Heart disease Maternal Uncle   . Breast cancer Neg Hx    Social History  Substance Use Topics  . Smoking status: Former Smoker    Packs/day: 2.00    Years: 25.00    Types: Cigarettes    Quit date: 08/22/1991  . Smokeless tobacco: Never Used  . Alcohol use No   OB History    No data available     Review of Systems  Constitutional: Positive for activity change. Negative for chills, fatigue and fever.  Musculoskeletal: Positive for myalgias.  Neurological: Positive for weakness.  All other systems reviewed and are negative.   Allergies  Amoxicillin and Vicodin [hydrocodone-acetaminophen]  Home Medications   Prior to Admission medications   Medication Sig Start Date End Date Taking? Authorizing Provider  albuterol (PROVENTIL HFA;VENTOLIN HFA) 108 (90 Base) MCG/ACT inhaler Inhale 2 puffs into the lungs every 6 (six) hours as needed for wheezing or shortness of breath. 01/31/16  Yes Amy Overton Mam, NP  atorvastatin (LIPITOR) 10 MG tablet Take 1 tablet by  mouth  daily 02/23/16  Yes Amy Overton Mam, NP  buPROPion (WELLBUTRIN XL) 300 MG 24 hr tablet Take 1 tablet (300 mg total) by mouth daily. 04/20/16  Yes Amy Overton Mam, NP  calcitonin, salmon, (MIACALCIN/FORTICAL) 200 UNIT/ACT nasal spray Place 1 spray into alternate nostrils daily. 04/28/16  Yes Amy Overton Mam, NP  calcium-vitamin D (OSCAL WITH D) 500-200 MG-UNIT tablet Take 2 tablets by mouth daily with breakfast. 05/01/16  Yes Amy Overton Mam, NP  clobetasol cream (TEMOVATE) [id number] % Apply 1  application topically 2 (two) times daily. 12/06/15  Yes Amy Overton Mam, NP  clopidogrel (PLAVIX) 75 MG tablet Take 1 tablet (75 mg total) by mouth daily. 02/15/16  Yes [name], MD  FLUVIRIN SUSP Inject as directed once.  04/28/16  Yes Historical Provider, MD  Melatonin 1 MG TABS Take 1 tablet (1 mg total) by mouth Nightly. 12/28/15  Yes Amy Overton Mam, NP  metoprolol tartrate (LOPRESSOR) 25 MG tablet Take 0.5 tablets (12.5 mg total) by mouth 2 (two) times daily. 07/03/16 10/01/16 Yes Wende Bushy, MD  nitroGLYCERIN (NITROSTAT) 0.4 MG SL tablet Place 1 tablet (0.4 mg total) under the tongue every 5 (five) minutes as needed for chest pain. 01/27/16  Yes Wende Bushy, MD  oxyCODONE-acetaminophen (PERCOCET) 10-325 MG tablet Take 1 tablet by mouth every 8 (eight) hours as needed for pain. 04/28/16  Yes Amy Overton Mam, NP  pantoprazole (PROTONIX) 40 MG tablet TAKE 1 TABLET BY MOUTH TWO  TIMES DAILY 06/07/16  Yes Amy Overton Mam, NP  PROLIA 60 MG/ML SOLN injection Inject 60 mg into the skin every 6 (six) months.  04/27/16  Yes Historical Provider, MD  venlafaxine XR (EFFEXOR-XR) 75 MG 24 hr capsule TAKE 1 CAPSULE BY MOUTH  DAILY WITH BREAKFAST 06/07/16  Yes Amy Overton Mam, NP  warfarin (COUMADIN) 5 MG tablet Take 1 tablet (5 mg total) by mouth daily at 6 PM. 02/15/16  Yes [name], MD   Meds Ordered and Administered this Visit  Medications - No data to display  BP 131/76 (BP Location: Right Arm)   Pulse 98   Temp 97 F (36.1 C) (Tympanic)   Resp 16   Ht 5\' 3"  (1.6 m)   Wt 143 lb (64.9 kg)   SpO2 98%   BMI 25.33 kg/m  No data found.   Physical Exam  Constitutional: She is oriented to person, place, and time. She appears well-developed and well-nourished. No distress.  HENT:  Head: Normocephalic and atraumatic.  Eyes: EOM are normal. Pupils are equal, round, and reactive to light.  Neck: Normal range of motion. Neck supple.  Cardiovascular: Intact distal pulses.    Musculoskeletal: She exhibits edema and tenderness.  Examination of the right forearm shows swelling in comparison to the left recorded measurements are below. As for inflow at the radial and ulnar pulses are present and brisk. The tissues appear soft. Patient has significant pain with any passive extension or flexion of the fingers. She appears to have good strength. There is noticeable ecchymosis particularly along the ulnar aspect of the forearm up to just below the elbow.  Neurological: She is alert and oriented to person, place, and time.  Skin: Skin is warm and dry. She is not diaphoretic.  Psychiatric: She has a normal mood and affect. Her behavior is normal. Judgment and thought content normal.  Nursing note and vitals reviewed.   Urgent Care Course   Clinical Course     Procedures (including critical care  time)  Labs Review Labs Reviewed  PROTIME-INR - Abnormal; Notable for the following:       Result Value   Prothrombin Time 37.5 (*)    All other components within normal limits    Imaging Review No results found.   Visual Acuity Review  Right Eye Distance:   Left Eye Distance:   Bilateral Distance:    Right Eye Near:   Left Eye Near:    Bilateral Near:         MDM   1. Right arm pain   2. Elevated INR   3. History of blood clots   4. PAD (peripheral artery disease) ([place])    I have told patient that I'm concerned with the history of elevated INR and her increasing pain over the last 5 days with swelling ecchymosis and pain with extension of her fingers or flexion of her fingers as a possible early sign of compartment syndrome. It may also be that she is just blood into the tissues that they are irritated but I can't be certain. I recommended she be seen at a higher level institution for definitive diagnosis and proper treatment. Her INR has certainly improved over last several days from 6.29 to 3.69 tonight. Is agreeable to go to [facility] for further evaluation.  She is stable at this point time and would prefer to go by privately owned vehicle. Spoke with Myriam Jacobson the triage nurse and informed her of our findings and recommendations.   Lorin Picket, PA-C 09/01/16 1845

## 2016-09-01 NOTE — ED Triage Notes (Signed)
Right  arm pain for 5 days. Painful can hardly move arm and hand. Patient is on coumadin

## 2016-09-01 NOTE — Discharge Instructions (Signed)
Please take your prescribed antibiotic for the next 5 days. Please follow-up with your primary care doctor in 3 days for recheck. INR. Please do not restart Coumadin until Saturday. Return to the emergency department for any worsening pain, fever, or any other symptom personally concerning to yourself.

## 2016-09-04 ENCOUNTER — Other Ambulatory Visit (INDEPENDENT_AMBULATORY_CARE_PROVIDER_SITE_OTHER): Payer: Self-pay | Admitting: Vascular Surgery

## 2016-09-04 ENCOUNTER — Other Ambulatory Visit
Admission: RE | Admit: 2016-09-04 | Discharge: 2016-09-04 | Disposition: A | Discharge status: Home / Self Care | Payer: Medicare Other | Source: Ambulatory Visit | Attending: Vascular Surgery | Admitting: Vascular Surgery

## 2016-09-04 DIAGNOSIS — I779 Disorder of arteries and arterioles, unspecified: Secondary | ICD-10-CM | POA: Diagnosis present

## 2016-09-04 LAB — PROTIME-INR
INR: 2.31
PROTHROMBIN TIME: 25.8 s — AB (ref 11.4–15.2)

## 2016-09-05 ENCOUNTER — Other Ambulatory Visit (INDEPENDENT_AMBULATORY_CARE_PROVIDER_SITE_OTHER): Payer: Self-pay | Admitting: Vascular Surgery

## 2016-09-05 DIAGNOSIS — I739 Peripheral vascular disease, unspecified: Secondary | ICD-10-CM

## 2016-09-11 ENCOUNTER — Other Ambulatory Visit
Admission: RE | Admit: 2016-09-11 | Discharge: 2016-09-11 | Disposition: A | Discharge status: Home / Self Care | Payer: Medicare Other | Source: Ambulatory Visit | Attending: Vascular Surgery | Admitting: Vascular Surgery

## 2016-09-11 DIAGNOSIS — I739 Peripheral vascular disease, unspecified: Secondary | ICD-10-CM | POA: Diagnosis present

## 2016-09-11 LAB — PROTIME-INR
INR: 2.8
Prothrombin Time: 30.1 seconds — ABNORMAL HIGH (ref 11.4–15.2)

## 2016-09-25 ENCOUNTER — Other Ambulatory Visit
Admission: RE | Admit: 2016-09-25 | Discharge: 2016-09-25 | Disposition: A | Discharge status: Home / Self Care | Payer: Medicare Other | Source: Ambulatory Visit | Attending: Vascular Surgery | Admitting: Vascular Surgery

## 2016-09-26 ENCOUNTER — Other Ambulatory Visit
Admission: RE | Admit: 2016-09-26 | Discharge: 2016-09-26 | Disposition: A | Discharge status: Home / Self Care | Payer: Medicare Other | Source: Ambulatory Visit | Attending: Vascular Surgery | Admitting: Vascular Surgery

## 2016-09-26 ENCOUNTER — Other Ambulatory Visit (INDEPENDENT_AMBULATORY_CARE_PROVIDER_SITE_OTHER): Payer: Self-pay

## 2016-09-26 ENCOUNTER — Telehealth (INDEPENDENT_AMBULATORY_CARE_PROVIDER_SITE_OTHER): Payer: Self-pay

## 2016-09-26 DIAGNOSIS — Z86718 Personal history of other venous thrombosis and embolism: Secondary | ICD-10-CM

## 2016-09-26 LAB — PROTIME-INR
INR: 3.74
PROTHROMBIN TIME: 37.9 s — AB (ref 11.4–15.2)

## 2016-09-26 NOTE — Telephone Encounter (Signed)
Patient had her INR checked today and it was 3.74, per Dr. Delana Meyer she is to hold her Coumadin for 2 days and restart in 2 days. She is to alternate 5mg  and 2.5mg  every other day and recheck in 2 weeks. Patient was informed.

## 2016-10-09 ENCOUNTER — Other Ambulatory Visit
Admission: RE | Admit: 2016-10-09 | Discharge: 2016-10-09 | Disposition: A | Discharge status: Home / Self Care | Payer: Medicare Other | Source: Ambulatory Visit | Attending: Vascular Surgery | Admitting: Vascular Surgery

## 2016-10-09 ENCOUNTER — Other Ambulatory Visit (INDEPENDENT_AMBULATORY_CARE_PROVIDER_SITE_OTHER): Payer: Self-pay

## 2016-10-09 DIAGNOSIS — Z86718 Personal history of other venous thrombosis and embolism: Secondary | ICD-10-CM | POA: Diagnosis present

## 2016-10-09 LAB — PROTIME-INR
INR: 2.55
Prothrombin Time: 27.9 seconds — ABNORMAL HIGH (ref 11.4–15.2)

## 2016-10-16 ENCOUNTER — Other Ambulatory Visit (INDEPENDENT_AMBULATORY_CARE_PROVIDER_SITE_OTHER): Payer: Self-pay

## 2016-10-16 DIAGNOSIS — I82721 Chronic embolism and thrombosis of deep veins of right upper extremity: Secondary | ICD-10-CM

## 2016-11-02 ENCOUNTER — Other Ambulatory Visit (INDEPENDENT_AMBULATORY_CARE_PROVIDER_SITE_OTHER): Payer: Self-pay | Admitting: Vascular Surgery

## 2016-11-06 ENCOUNTER — Other Ambulatory Visit
Admission: RE | Admit: 2016-11-06 | Discharge: 2016-11-06 | Disposition: A | Discharge status: Home / Self Care | Payer: Medicare Other | Source: Ambulatory Visit | Attending: Vascular Surgery | Admitting: Vascular Surgery

## 2016-11-06 DIAGNOSIS — I739 Peripheral vascular disease, unspecified: Secondary | ICD-10-CM | POA: Insufficient documentation

## 2016-11-06 LAB — PROTIME-INR
INR: 2.37
Prothrombin Time: 26.3 seconds — ABNORMAL HIGH (ref 11.4–15.2)

## 2016-11-07 ENCOUNTER — Telehealth (INDEPENDENT_AMBULATORY_CARE_PROVIDER_SITE_OTHER): Payer: Self-pay

## 2016-11-07 ENCOUNTER — Other Ambulatory Visit (INDEPENDENT_AMBULATORY_CARE_PROVIDER_SITE_OTHER): Payer: Self-pay | Admitting: Vascular Surgery

## 2016-11-07 DIAGNOSIS — I739 Peripheral vascular disease, unspecified: Secondary | ICD-10-CM

## 2016-11-07 NOTE — Telephone Encounter (Signed)
The patients INR is fine. She should continue her current dose of coumadin. I have placed an order for her INR to be drawn in 7-10 days.

## 2016-11-07 NOTE — Telephone Encounter (Signed)
Patient is to continue Warfarin regimen dose and an order has been placed to have her INR checked in 7-10 days.

## 2016-11-13 ENCOUNTER — Ambulatory Visit (INDEPENDENT_AMBULATORY_CARE_PROVIDER_SITE_OTHER): Payer: Medicare Other | Admitting: Vascular Surgery

## 2016-11-13 ENCOUNTER — Encounter (INDEPENDENT_AMBULATORY_CARE_PROVIDER_SITE_OTHER): Payer: Medicare Other

## 2016-11-14 ENCOUNTER — Other Ambulatory Visit
Admission: RE | Admit: 2016-11-14 | Discharge: 2016-11-14 | Disposition: A | Discharge status: Home / Self Care | Payer: Medicare Other | Source: Ambulatory Visit | Attending: Vascular Surgery | Admitting: Vascular Surgery

## 2016-11-14 DIAGNOSIS — D688 Other specified coagulation defects: Secondary | ICD-10-CM | POA: Diagnosis present

## 2016-11-14 DIAGNOSIS — Z7901 Long term (current) use of anticoagulants: Secondary | ICD-10-CM | POA: Diagnosis not present

## 2016-11-14 LAB — PROTIME-INR
INR: 2.34
Prothrombin Time: 26.1 seconds — ABNORMAL HIGH (ref 11.4–15.2)

## 2016-11-14 NOTE — Progress Notes (Signed)
Spoke with patient and let her know about her INR results and when she should have her INR rechecked.

## 2016-11-14 NOTE — Progress Notes (Signed)
Sara [name]'s INR is fine. She should continue her normal coumadin dose. I will place an order for another INR in two weeks. Thank you!

## 2016-11-28 ENCOUNTER — Other Ambulatory Visit
Admission: RE | Admit: 2016-11-28 | Discharge: 2016-11-28 | Disposition: A | Discharge status: Home / Self Care | Payer: Medicare Other | Source: Ambulatory Visit | Attending: Vascular Surgery | Admitting: Vascular Surgery

## 2016-11-28 DIAGNOSIS — I739 Peripheral vascular disease, unspecified: Secondary | ICD-10-CM | POA: Insufficient documentation

## 2016-11-28 LAB — PROTIME-INR
INR: 3.07
Prothrombin Time: 32.4 seconds — ABNORMAL HIGH (ref 11.4–15.2)

## 2016-11-29 ENCOUNTER — Other Ambulatory Visit (INDEPENDENT_AMBULATORY_CARE_PROVIDER_SITE_OTHER): Payer: Self-pay | Admitting: Vascular Surgery

## 2016-11-29 ENCOUNTER — Telehealth (INDEPENDENT_AMBULATORY_CARE_PROVIDER_SITE_OTHER): Payer: Self-pay

## 2016-11-29 DIAGNOSIS — I739 Peripheral vascular disease, unspecified: Secondary | ICD-10-CM

## 2016-11-29 NOTE — Telephone Encounter (Signed)
Patient called for her results of her INR , which is 3.0 and per Hezzie Bump the patient is to continue her current Coumadin dosage and recheck in 2 weeks.

## 2016-12-13 ENCOUNTER — Other Ambulatory Visit (INDEPENDENT_AMBULATORY_CARE_PROVIDER_SITE_OTHER): Payer: Self-pay | Admitting: Vascular Surgery

## 2016-12-13 ENCOUNTER — Other Ambulatory Visit
Admission: RE | Admit: 2016-12-13 | Discharge: 2016-12-13 | Disposition: A | Discharge status: Home / Self Care | Payer: Medicare Other | Source: Ambulatory Visit | Attending: Vascular Surgery | Admitting: Vascular Surgery

## 2016-12-13 DIAGNOSIS — I739 Peripheral vascular disease, unspecified: Secondary | ICD-10-CM | POA: Diagnosis present

## 2016-12-13 LAB — PROTIME-INR
INR: 2.87
PROTHROMBIN TIME: 30.7 s — AB (ref 11.4–15.2)

## 2016-12-20 ENCOUNTER — Other Ambulatory Visit (INDEPENDENT_AMBULATORY_CARE_PROVIDER_SITE_OTHER): Payer: Self-pay | Admitting: Vascular Surgery

## 2016-12-20 DIAGNOSIS — I739 Peripheral vascular disease, unspecified: Secondary | ICD-10-CM

## 2016-12-21 ENCOUNTER — Encounter (INDEPENDENT_AMBULATORY_CARE_PROVIDER_SITE_OTHER): Payer: Self-pay | Admitting: Vascular Surgery

## 2016-12-21 ENCOUNTER — Ambulatory Visit (INDEPENDENT_AMBULATORY_CARE_PROVIDER_SITE_OTHER): Payer: Medicare Other

## 2016-12-21 ENCOUNTER — Ambulatory Visit (INDEPENDENT_AMBULATORY_CARE_PROVIDER_SITE_OTHER): Payer: Medicare Other | Admitting: Vascular Surgery

## 2016-12-21 VITALS — BP 123/75 | HR 85 | Resp 16 | Wt 136.8 lb

## 2016-12-21 DIAGNOSIS — I779 Disorder of arteries and arterioles, unspecified: Secondary | ICD-10-CM

## 2016-12-21 DIAGNOSIS — I25119 Atherosclerotic heart disease of native coronary artery with unspecified angina pectoris: Secondary | ICD-10-CM

## 2016-12-21 DIAGNOSIS — E784 Other hyperlipidemia: Secondary | ICD-10-CM

## 2016-12-21 DIAGNOSIS — I739 Peripheral vascular disease, unspecified: Secondary | ICD-10-CM

## 2016-12-21 DIAGNOSIS — E7849 Other hyperlipidemia: Secondary | ICD-10-CM

## 2016-12-21 DIAGNOSIS — I2510 Atherosclerotic heart disease of native coronary artery without angina pectoris: Secondary | ICD-10-CM | POA: Insufficient documentation

## 2016-12-21 DIAGNOSIS — I6521 Occlusion and stenosis of right carotid artery: Secondary | ICD-10-CM

## 2016-12-21 DIAGNOSIS — K219 Gastro-esophageal reflux disease without esophagitis: Secondary | ICD-10-CM

## 2016-12-21 DIAGNOSIS — I10 Essential (primary) hypertension: Secondary | ICD-10-CM | POA: Diagnosis not present

## 2016-12-21 NOTE — Progress Notes (Signed)
MRN : [id number]  Sara [name] is a 66 y.o. (21-Jun-1951) female who presents with chief complaint of  Chief Complaint  Patient presents with  . Follow-up  .  History of Present Illness:The patient returns to the office for followup and review of the noninvasive studies. There have been no interval changes in lower extremity symptoms. No interval shortening of the patient's claudication distance or development of rest pain symptoms. No new ulcers or wounds have occurred since the last visit.  There have been no significant changes to the patient's overall health care.  The patient denies amaurosis fugax or recent TIA symptoms. There are no recent neurological changes noted. The patient denies history of DVT, PE or superficial thrombophlebitis. The patient denies recent episodes of angina or shortness of breath.    Current Meds  Medication Sig  . atorvastatin (LIPITOR) 40 MG tablet   . buPROPion (WELLBUTRIN XL) 300 MG 24 hr tablet Take 1 tablet (300 mg total) by mouth daily.  . clopidogrel (PLAVIX) 75 MG tablet TAKE 1 TABLET BY MOUTH  DAILY  . ibandronate (BONIVA) 150 MG tablet   . losartan (COZAAR) 25 MG tablet   . pantoprazole (PROTONIX) 40 MG tablet TAKE 1 TABLET BY MOUTH TWO  TIMES DAILY  . warfarin (COUMADIN) 5 MG tablet Take 1 tablet (5 mg total) by mouth daily at 6 PM.    Past Medical History:  Diagnosis Date  . Depression   . GERD (gastroesophageal reflux disease)   . History of blood clots   . Osteoporosis   . Stomach ulcer   . Vascular disease    Sees Dr. Delana Meyer  . Vertigo    Last episode approx Aug 2015    Past Surgical History:  Procedure Laterality Date  . BREAST CYST ASPIRATION Left   . ESOPHAGOGASTRODUODENOSCOPY N/A 12/21/2014   Procedure: ESOPHAGOGASTRODUODENOSCOPY (EGD);  Surgeon: Lucilla Lame, MD;  Location: [place];  Service: Gastroenterology;  Laterality: N/A;  . ESOPHAGOGASTRODUODENOSCOPY (EGD) WITH PROPOFOL N/A 02/08/2016   Procedure:  ESOPHAGOGASTRODUODENOSCOPY (EGD) WITH PROPOFOL;  Surgeon: Lucilla Lame, MD;  Location: [facility];  Service: Endoscopy;  Laterality: N/A;  . HEMORROIDECTOMY  2014  . PERIPHERAL VASCULAR CATHETERIZATION N/A 02/09/2016   Procedure: Abdominal Aortogram w/Lower Extremity;  Surgeon: [name], MD;  Location: [place] [facility];  Service: Cardiovascular;  Laterality: N/A;  . PERIPHERAL VASCULAR CATHETERIZATION Right 02/10/2016   Procedure: Lower Extremity Angiography;  Surgeon: [name], MD;  Location: [place] [facility];  Service: Cardiovascular;  Laterality: Right;  . TONSILECTOMY, ADENOIDECTOMY, BILATERAL MYRINGOTOMY AND TUBES  1968  . VASCULAR SURGERY  [id number]   Fem-Pop Bypass    Social History Social History  Substance Use Topics  . Smoking status: Former Smoker    Packs/day: 2.00    Years: 25.00    Types: Cigarettes    Quit date: 08/22/1991  . Smokeless tobacco: Never Used  . Alcohol use No    Family History Family History  Problem Relation Age of Onset  . CVA Mother   . Heart attack Mother   . Cancer Father     colon cancer  . Colon cancer Father   . Heart disease Maternal Uncle   . Breast cancer Neg Hx     Allergies  Allergen Reactions  . Amoxicillin Other (See Comments)    Yeast infection  . Vicodin [Hydrocodone-Acetaminophen] Hives and Rash     REVIEW OF SYSTEMS (Negative unless checked)  Constitutional: [] Weight loss  [] Fever  []   Chills Cardiac: [] Chest pain   [] Chest pressure   [] Palpitations   [] Shortness of breath when laying flat   [] Shortness of breath with exertion. Vascular:  [x] Pain in legs with walking   [] Pain in legs at rest  [] History of DVT   [] Phlebitis   [] Swelling in legs   [] Varicose veins   [] Non-healing ulcers Pulmonary:   [] Uses home oxygen   [] Productive cough   [] Hemoptysis   [] Wheeze  [] COPD   [] Asthma Neurologic:  [] Dizziness   [] Seizures   [] History of stroke   [] History of TIA  [] Aphasia   [] Vissual changes    [] Weakness or numbness in arm   [] Weakness or numbness in leg Musculoskeletal:   [] Joint swelling   [x] Joint pain   [x] Low back pain Hematologic:  [] Easy bruising  [] Easy bleeding   [] Hypercoagulable state   [] Anemic Gastrointestinal:  [] Diarrhea   [] Vomiting  [] Gastroesophageal reflux/heartburn   [] Difficulty swallowing. Genitourinary:  [] Chronic kidney disease   [] Difficult urination  [] Frequent urination   [] Blood in urine Skin:  [] Rashes   [] Ulcers  Psychological:  [] History of anxiety   []  History of major depression.  Physical Examination  Vitals:   12/21/16 0850  BP: 123/75  Pulse: 85  Resp: 16  Weight: 136 lb 12.8 oz (62.1 kg)   Body mass index is 24.23 kg/m. Gen: WD/WN, NAD Head: [place]/AT, No temporalis wasting.  Ear/Nose/Throat: Hearing grossly intact, nares w/o erythema or drainage Eyes: PER, EOMI, sclera nonicteric.  Neck: Supple, no large masses.   Pulmonary:  Good air movement, no audible wheezing bilaterally, no use of accessory muscles.  Cardiac: RRR, no JVD Vascular:  No carotid bruits Vessel Right Left  Radial Palpable Palpable  Ulnar Palpable Palpable  Brachial Palpable Palpable  Carotid Palpable Palpable  Femoral Palpable Palpable  Popliteal Palpable Palpable  PT Palpable Palpable  DP Palpable Palpable  Gastrointestinal: Non-distended. No guarding/no peritoneal signs.  Musculoskeletal: M/S 5/5 throughout.  No deformity or atrophy.  Neurologic: CN 2-12 intact. Symmetrical.  Speech is fluent. Motor exam as listed above. Psychiatric: Judgment intact, Mood & affect appropriate for pt's clinical situation. Dermatologic: No rashes or ulcers noted.  No changes consistent with cellulitis. Lymph : No lichenification or skin changes of chronic lymphedema.  CBC Lab Results  Component Value Date   WBC 16.2 (H) 02/14/2016   HGB 12.7 02/14/2016   HCT 37.5 02/14/2016   MCV 95.2 02/14/2016   PLT 116 (L) 02/14/2016    BMET    Component Value Date/Time   NA 138  05/16/2016 1034   NA 139 09/21/2015 0916   K 4.5 05/16/2016 1034   CL 106 05/16/2016 1034   CO2 21 05/16/2016 1034   GLUCOSE 103 (H) 05/16/2016 1034   BUN 12 05/16/2016 1034   BUN 12 09/21/2015 0916   CREATININE 0.88 05/16/2016 1034   CALCIUM 8.8 05/16/2016 1034   GFRNONAA 66 05/01/2016 0846   GFRAA 76 05/01/2016 0846   CrCl cannot be calculated (Patient's most recent lab result is older than the maximum 21 days allowed.).  COAG Lab Results  Component Value Date   INR 2.87 12/13/2016   INR 3.07 11/28/2016   INR 2.34 11/14/2016    Radiology No results found.  Assessment/Plan 1. Peripheral arterial occlusive disease (HCC)  Recommend:  The patient has evidence of atherosclerosis of the lower extremities with claudication.  The patient does not voice lifestyle limiting changes at this point in time.  Noninvasive studies do not suggest clinically significant change.  No  invasive studies, angiography or surgery at this time The patient should continue walking and begin a more formal exercise program.  The patient should continue antiplatelet therapy and aggressive treatment of the lipid abnormalities  No changes in the patient's medications at this time  The patient should continue wearing graduated compression socks 10-15 mmHg strength to control the mild edema.    2. Stenosis of right carotid artery Recommend:  Given the patient's asymptomatic subcritical stenosis no further invasive testing or surgery at this time.  Continue antiplatelet therapy as prescribed Continue management of CAD, HTN and Hyperlipidemia Healthy heart diet,  encouraged exercise at least 4 times per week Follow up in 6 months with duplex ultrasound and physical exam  3. Coronary artery disease involving native coronary artery of native heart with angina pectoris ([place]) Continue cardiac and antihypertensive medications as already ordered and reviewed, no changes at this time.  Continue statin as  ordered and reviewed, no changes at this time  Nitrates PRN for chest pain   4. Essential hypertension Continue antihypertensive medications as already ordered, these medications have been reviewed and there are no changes at this time.   5. Gastroesophageal reflux disease without esophagitis Continue PPI as already ordered, these medications have been reviewed and there are no changes at this time.   6. Other hyperlipidemia Continue statin as ordered and reviewed, no changes at this time     [name], MD  12/21/2016 9:19 AM

## 2016-12-26 ENCOUNTER — Other Ambulatory Visit
Admission: RE | Admit: 2016-12-26 | Discharge: 2016-12-26 | Disposition: A | Discharge status: Home / Self Care | Payer: Medicare Other | Source: Ambulatory Visit | Attending: Vascular Surgery | Admitting: Vascular Surgery

## 2016-12-26 DIAGNOSIS — I739 Peripheral vascular disease, unspecified: Secondary | ICD-10-CM | POA: Diagnosis present

## 2016-12-26 LAB — PROTIME-INR
INR: 2.62
Prothrombin Time: 28.5 seconds — ABNORMAL HIGH (ref 11.4–15.2)

## 2016-12-27 ENCOUNTER — Other Ambulatory Visit (INDEPENDENT_AMBULATORY_CARE_PROVIDER_SITE_OTHER): Payer: Self-pay | Admitting: Vascular Surgery

## 2016-12-27 DIAGNOSIS — I739 Peripheral vascular disease, unspecified: Secondary | ICD-10-CM

## 2017-01-09 ENCOUNTER — Other Ambulatory Visit
Admission: RE | Admit: 2017-01-09 | Discharge: 2017-01-09 | Disposition: A | Discharge status: Home / Self Care | Payer: Medicare Other | Source: Ambulatory Visit | Attending: Vascular Surgery | Admitting: Vascular Surgery

## 2017-01-09 DIAGNOSIS — I739 Peripheral vascular disease, unspecified: Secondary | ICD-10-CM | POA: Insufficient documentation

## 2017-01-09 LAB — PROTIME-INR
INR: 2.55
Prothrombin Time: 27.9 seconds — ABNORMAL HIGH (ref 11.4–15.2)

## 2017-01-30 ENCOUNTER — Other Ambulatory Visit (INDEPENDENT_AMBULATORY_CARE_PROVIDER_SITE_OTHER): Payer: Self-pay | Admitting: Vascular Surgery

## 2017-01-30 ENCOUNTER — Other Ambulatory Visit
Admission: RE | Admit: 2017-01-30 | Discharge: 2017-01-30 | Disposition: A | Discharge status: Home / Self Care | Payer: Medicare Other | Source: Ambulatory Visit | Attending: Vascular Surgery | Admitting: Vascular Surgery

## 2017-01-30 DIAGNOSIS — I779 Disorder of arteries and arterioles, unspecified: Secondary | ICD-10-CM | POA: Insufficient documentation

## 2017-01-30 LAB — PROTIME-INR
INR: 2.12
Prothrombin Time: 24.1 seconds — ABNORMAL HIGH (ref 11.4–15.2)

## 2017-01-30 NOTE — Progress Notes (Unsigned)
inr

## 2017-02-07 DIAGNOSIS — I340 Nonrheumatic mitral (valve) insufficiency: Secondary | ICD-10-CM | POA: Insufficient documentation

## 2017-02-07 DIAGNOSIS — R072 Precordial pain: Secondary | ICD-10-CM | POA: Insufficient documentation

## 2017-02-07 DIAGNOSIS — R0600 Dyspnea, unspecified: Secondary | ICD-10-CM | POA: Insufficient documentation

## 2017-02-07 DIAGNOSIS — R0609 Other forms of dyspnea: Secondary | ICD-10-CM | POA: Insufficient documentation

## 2017-02-08 DIAGNOSIS — I340 Nonrheumatic mitral (valve) insufficiency: Secondary | ICD-10-CM | POA: Insufficient documentation

## 2017-02-15 HISTORY — PX: CARDIAC CATHETERIZATION: SHX172

## 2017-02-22 ENCOUNTER — Other Ambulatory Visit
Admission: RE | Admit: 2017-02-22 | Discharge: 2017-02-22 | Disposition: A | Discharge status: Home / Self Care | Payer: Medicare Other | Source: Ambulatory Visit | Attending: Vascular Surgery | Admitting: Vascular Surgery

## 2017-02-22 DIAGNOSIS — I779 Disorder of arteries and arterioles, unspecified: Secondary | ICD-10-CM | POA: Insufficient documentation

## 2017-02-22 LAB — PROTIME-INR
INR: 1.96
Prothrombin Time: 22.6 seconds — ABNORMAL HIGH (ref 11.4–15.2)

## 2017-02-23 ENCOUNTER — Telehealth (INDEPENDENT_AMBULATORY_CARE_PROVIDER_SITE_OTHER): Payer: Self-pay

## 2017-02-23 NOTE — Telephone Encounter (Signed)
Patient called about results of INR. Her INR is 1.9 and per Hezzie Bump she is to recheck in 2 weeks and watch her greens intake, patient stated she had a heart cath and had to be off her Coumadin for a couple of days.

## 2017-03-02 ENCOUNTER — Other Ambulatory Visit (INDEPENDENT_AMBULATORY_CARE_PROVIDER_SITE_OTHER): Payer: Self-pay

## 2017-03-02 ENCOUNTER — Telehealth (INDEPENDENT_AMBULATORY_CARE_PROVIDER_SITE_OTHER): Payer: Self-pay

## 2017-03-02 DIAGNOSIS — Z86718 Personal history of other venous thrombosis and embolism: Secondary | ICD-10-CM

## 2017-03-02 MED ORDER — WARFARIN SODIUM 5 MG PO TABS: 5.0000 mg | ORAL_TABLET | Freq: Every day | ORAL | 11 refills | Status: DC

## 2017-03-02 NOTE — Telephone Encounter (Signed)
Patient calling for refill on Coumadin.

## 2017-03-08 ENCOUNTER — Other Ambulatory Visit
Admission: RE | Admit: 2017-03-08 | Discharge: 2017-03-08 | Disposition: A | Discharge status: Home / Self Care | Payer: Medicare Other | Source: Ambulatory Visit | Attending: Vascular Surgery | Admitting: Vascular Surgery

## 2017-03-08 ENCOUNTER — Telehealth (INDEPENDENT_AMBULATORY_CARE_PROVIDER_SITE_OTHER): Payer: Self-pay

## 2017-03-08 DIAGNOSIS — I2510 Atherosclerotic heart disease of native coronary artery without angina pectoris: Secondary | ICD-10-CM | POA: Diagnosis not present

## 2017-03-08 DIAGNOSIS — I739 Peripheral vascular disease, unspecified: Secondary | ICD-10-CM | POA: Diagnosis not present

## 2017-03-08 DIAGNOSIS — I6529 Occlusion and stenosis of unspecified carotid artery: Secondary | ICD-10-CM | POA: Diagnosis present

## 2017-03-08 LAB — PROTIME-INR
INR: 2.24
PROTHROMBIN TIME: 25.2 s — AB (ref 11.4–15.2)

## 2017-03-08 NOTE — Telephone Encounter (Signed)
Left a message for the patient regarding her INR being 2.2 and per Dr. Delana Meyer she is stable and to recheck in 2 weeks.

## 2017-03-09 ENCOUNTER — Telehealth: Payer: Self-pay | Admitting: Cardiovascular Disease

## 2017-03-09 NOTE — Telephone Encounter (Signed)
3 attempts to schedule fu appt from recall list.   Deleting recall.  6 month f/u per checkout 05/24/2016 Rockey Situ

## 2017-03-22 ENCOUNTER — Telehealth (INDEPENDENT_AMBULATORY_CARE_PROVIDER_SITE_OTHER): Payer: Self-pay

## 2017-03-22 ENCOUNTER — Other Ambulatory Visit
Admission: RE | Admit: 2017-03-22 | Discharge: 2017-03-22 | Disposition: A | Discharge status: Home / Self Care | Payer: Medicare Other | Source: Ambulatory Visit | Attending: Vascular Surgery | Admitting: Vascular Surgery

## 2017-03-22 DIAGNOSIS — I779 Disorder of arteries and arterioles, unspecified: Secondary | ICD-10-CM | POA: Insufficient documentation

## 2017-03-22 LAB — PROTIME-INR
INR: 2.91
Prothrombin Time: 31 seconds — ABNORMAL HIGH (ref 11.4–15.2)

## 2017-03-22 NOTE — Telephone Encounter (Signed)
Spoke with the patient and let her know that her INR is 2.9 and per Dr. Delana Meyer she is to continue taking the dosage she is currently on and recheck her INR next week.

## 2017-03-28 ENCOUNTER — Other Ambulatory Visit
Admission: RE | Admit: 2017-03-28 | Discharge: 2017-03-28 | Disposition: A | Discharge status: Home / Self Care | Payer: Medicare Other | Source: Ambulatory Visit | Attending: Vascular Surgery | Admitting: Vascular Surgery

## 2017-03-28 DIAGNOSIS — I739 Peripheral vascular disease, unspecified: Secondary | ICD-10-CM

## 2017-03-28 LAB — PROTIME-INR
INR: 2.71
PROTHROMBIN TIME: 29.3 s — AB (ref 11.4–15.2)

## 2017-03-29 ENCOUNTER — Telehealth (INDEPENDENT_AMBULATORY_CARE_PROVIDER_SITE_OTHER): Payer: Self-pay

## 2017-03-29 NOTE — Telephone Encounter (Signed)
Ms Plott called about her inr results.I spoke with GS and he said no changes and be recheck in 2 weeks

## 2017-04-11 ENCOUNTER — Other Ambulatory Visit
Admission: RE | Admit: 2017-04-11 | Discharge: 2017-04-11 | Disposition: A | Discharge status: Home / Self Care | Payer: Medicare Other | Source: Ambulatory Visit | Attending: Vascular Surgery | Admitting: Vascular Surgery

## 2017-04-11 DIAGNOSIS — I739 Peripheral vascular disease, unspecified: Secondary | ICD-10-CM | POA: Diagnosis present

## 2017-04-11 LAB — PROTIME-INR
INR: 2.23
PROTHROMBIN TIME: 25.1 s — AB (ref 11.4–15.2)

## 2017-04-12 ENCOUNTER — Telehealth (INDEPENDENT_AMBULATORY_CARE_PROVIDER_SITE_OTHER): Payer: Self-pay

## 2017-04-12 NOTE — Telephone Encounter (Signed)
Patient called about inr results 2.23 and I  spoke to [place] and he said no change and recheck in 1 month

## 2017-05-14 ENCOUNTER — Other Ambulatory Visit
Admission: RE | Admit: 2017-05-14 | Discharge: 2017-05-14 | Disposition: A | Discharge status: Home / Self Care | Payer: Medicare Other | Source: Ambulatory Visit | Attending: Vascular Surgery | Admitting: Vascular Surgery

## 2017-05-14 ENCOUNTER — Other Ambulatory Visit (INDEPENDENT_AMBULATORY_CARE_PROVIDER_SITE_OTHER): Payer: Self-pay | Admitting: Vascular Surgery

## 2017-05-14 DIAGNOSIS — I739 Peripheral vascular disease, unspecified: Secondary | ICD-10-CM

## 2017-05-14 LAB — PROTIME-INR
INR: 3.36
Prothrombin Time: 33.8 seconds — ABNORMAL HIGH (ref 11.4–15.2)

## 2017-05-15 ENCOUNTER — Telehealth (INDEPENDENT_AMBULATORY_CARE_PROVIDER_SITE_OTHER): Payer: Self-pay

## 2017-05-15 NOTE — Telephone Encounter (Signed)
Patient called  for INR results which were 3.36 and she rotating 5mg  and 2.5mg  of Coumadin.I spoke with JD and advise for the patient to take 4mg  and be recheck in 1 wk. A rx had to be called in for Coumadin 1mg  #30 take 4 tab daily for 1 week ,by [facility]

## 2017-05-22 ENCOUNTER — Other Ambulatory Visit
Admission: RE | Admit: 2017-05-22 | Discharge: 2017-05-22 | Disposition: A | Discharge status: Home / Self Care | Payer: Medicare Other | Source: Ambulatory Visit | Attending: Vascular Surgery | Admitting: Vascular Surgery

## 2017-05-22 ENCOUNTER — Other Ambulatory Visit (INDEPENDENT_AMBULATORY_CARE_PROVIDER_SITE_OTHER): Payer: Self-pay | Admitting: Vascular Surgery

## 2017-05-22 ENCOUNTER — Telehealth (INDEPENDENT_AMBULATORY_CARE_PROVIDER_SITE_OTHER): Payer: Self-pay

## 2017-05-22 DIAGNOSIS — I739 Peripheral vascular disease, unspecified: Secondary | ICD-10-CM | POA: Diagnosis present

## 2017-05-22 LAB — PROTIME-INR
INR: 2.55
Prothrombin Time: 27.2 seconds — ABNORMAL HIGH (ref 11.4–15.2)

## 2017-05-22 NOTE — Telephone Encounter (Signed)
Called the patient to let her know that her INR is 2.55. There are no changes to her dosage at this time and she is to have her INR checked again in one week. Sara [name] has already put the order in.

## 2017-05-29 ENCOUNTER — Other Ambulatory Visit (INDEPENDENT_AMBULATORY_CARE_PROVIDER_SITE_OTHER): Payer: Self-pay | Admitting: Vascular Surgery

## 2017-05-29 ENCOUNTER — Other Ambulatory Visit
Admission: RE | Admit: 2017-05-29 | Discharge: 2017-05-29 | Disposition: A | Discharge status: Home / Self Care | Payer: Medicare Other | Source: Ambulatory Visit | Attending: Vascular Surgery | Admitting: Vascular Surgery

## 2017-05-29 DIAGNOSIS — I739 Peripheral vascular disease, unspecified: Secondary | ICD-10-CM | POA: Diagnosis present

## 2017-05-29 LAB — PROTIME-INR
INR: 3.12
Prothrombin Time: 31.9 seconds — ABNORMAL HIGH (ref 11.4–15.2)

## 2017-06-05 ENCOUNTER — Other Ambulatory Visit
Admission: RE | Admit: 2017-06-05 | Discharge: 2017-06-05 | Disposition: A | Discharge status: Home / Self Care | Payer: Medicare Other | Source: Ambulatory Visit | Attending: Vascular Surgery | Admitting: Vascular Surgery

## 2017-06-05 ENCOUNTER — Other Ambulatory Visit (INDEPENDENT_AMBULATORY_CARE_PROVIDER_SITE_OTHER): Payer: Self-pay | Admitting: Vascular Surgery

## 2017-06-05 DIAGNOSIS — I739 Peripheral vascular disease, unspecified: Secondary | ICD-10-CM

## 2017-06-05 LAB — PROTIME-INR
INR: 2.7
PROTHROMBIN TIME: 28.5 s — AB (ref 11.4–15.2)

## 2017-06-05 NOTE — Progress Notes (Signed)
Spoke with the patient and gave her the results of her INR 2.7 and to continue current dosing and recheck in two weeks.

## 2017-06-19 ENCOUNTER — Other Ambulatory Visit (INDEPENDENT_AMBULATORY_CARE_PROVIDER_SITE_OTHER): Payer: Self-pay | Admitting: Vascular Surgery

## 2017-06-19 ENCOUNTER — Other Ambulatory Visit
Admission: RE | Admit: 2017-06-19 | Discharge: 2017-06-19 | Disposition: A | Discharge status: Home / Self Care | Payer: Medicare Other | Source: Ambulatory Visit | Attending: Vascular Surgery | Admitting: Vascular Surgery

## 2017-06-19 DIAGNOSIS — I779 Disorder of arteries and arterioles, unspecified: Secondary | ICD-10-CM

## 2017-06-19 DIAGNOSIS — I739 Peripheral vascular disease, unspecified: Secondary | ICD-10-CM | POA: Diagnosis present

## 2017-06-19 LAB — PROTIME-INR
INR: 2.01
Prothrombin Time: 22.6 seconds — ABNORMAL HIGH (ref 11.4–15.2)

## 2017-06-20 ENCOUNTER — Other Ambulatory Visit (INDEPENDENT_AMBULATORY_CARE_PROVIDER_SITE_OTHER): Payer: Self-pay | Admitting: Vascular Surgery

## 2017-06-20 NOTE — Progress Notes (Signed)
Spoke with patient and gave her the results of her INR- 2.1 and let her know to keep taking the same dose, recheck in one week and if she needs more Coumadin to call her pharmacy for them to fax [place] the refill. Per Hezzie Bump PA.

## 2017-06-26 ENCOUNTER — Encounter (INDEPENDENT_AMBULATORY_CARE_PROVIDER_SITE_OTHER): Payer: Self-pay | Admitting: Vascular Surgery

## 2017-06-26 ENCOUNTER — Other Ambulatory Visit
Admission: RE | Admit: 2017-06-26 | Discharge: 2017-06-26 | Disposition: A | Discharge status: Home / Self Care | Payer: Medicare Other | Source: Ambulatory Visit | Attending: Vascular Surgery | Admitting: Vascular Surgery

## 2017-06-26 DIAGNOSIS — I779 Disorder of arteries and arterioles, unspecified: Secondary | ICD-10-CM | POA: Insufficient documentation

## 2017-06-26 LAB — PROTIME-INR
INR: 2.44
PROTHROMBIN TIME: 26.3 s — AB (ref 11.4–15.2)

## 2017-06-26 NOTE — Progress Notes (Signed)
Patient was informed of her INR status of 2.4 and that she can recheck in two weeks.

## 2017-07-02 ENCOUNTER — Encounter (INDEPENDENT_AMBULATORY_CARE_PROVIDER_SITE_OTHER): Payer: Self-pay | Admitting: Vascular Surgery

## 2017-07-02 ENCOUNTER — Ambulatory Visit (INDEPENDENT_AMBULATORY_CARE_PROVIDER_SITE_OTHER): Payer: Medicare Other | Admitting: Vascular Surgery

## 2017-07-02 ENCOUNTER — Ambulatory Visit (INDEPENDENT_AMBULATORY_CARE_PROVIDER_SITE_OTHER): Payer: Medicare Other

## 2017-07-02 VITALS — BP 153/84 | HR 68 | Resp 15 | Ht 64.5 in | Wt 135.0 lb

## 2017-07-02 DIAGNOSIS — E7849 Other hyperlipidemia: Secondary | ICD-10-CM

## 2017-07-02 DIAGNOSIS — I6523 Occlusion and stenosis of bilateral carotid arteries: Secondary | ICD-10-CM

## 2017-07-02 DIAGNOSIS — I10 Essential (primary) hypertension: Secondary | ICD-10-CM

## 2017-07-02 DIAGNOSIS — I779 Disorder of arteries and arterioles, unspecified: Secondary | ICD-10-CM

## 2017-07-02 DIAGNOSIS — I25119 Atherosclerotic heart disease of native coronary artery with unspecified angina pectoris: Secondary | ICD-10-CM

## 2017-07-02 DIAGNOSIS — I6521 Occlusion and stenosis of right carotid artery: Secondary | ICD-10-CM

## 2017-07-02 NOTE — Progress Notes (Signed)
MRN : [id number]  Sara [name] is a 66 y.o. (1950-12-09) female who presents with chief complaint of  Chief Complaint  Patient presents with  . Carotid    6 month carotid and ABI follow up  .  History of Present Illness: The patient returns to the office for followup and review of the noninvasive studies. There have been no interval changes in lower extremity symptoms. No interval shortening of the patient's claudication distance or development of rest pain symptoms. No new ulcers or wounds have occurred since the last visit.  There have been no significant changes to the patient's overall health care.  The patient denies amaurosis fugax or recent TIA symptoms. There are no recent neurological changes noted. The patient denies history of DVT, PE or superficial thrombophlebitis. The patient denies recent episodes of angina or shortness of breath.   ABI Rt=1.03 and Lt=1.02  (previous ABI's Rt=1.14 and Lt=1.18) Duplex ultrasound of the right leg bypass is patent  Carotid duplex shows <40% bilateral ICA stenosis  Current Meds  Medication Sig  . albuterol (PROVENTIL HFA;VENTOLIN HFA) 108 (90 Base) MCG/ACT inhaler Inhale 2 puffs into the lungs every 6 (six) hours as needed for wheezing or shortness of breath.  Marland Kitchen atorvastatin (LIPITOR) 10 MG tablet Take 1 tablet by mouth  daily  . atorvastatin (LIPITOR) 40 MG tablet   . buPROPion (WELLBUTRIN XL) 300 MG 24 hr tablet Take 1 tablet (300 mg total) by mouth daily.  . calcitonin, salmon, (MIACALCIN/FORTICAL) 200 UNIT/ACT nasal spray Place 1 spray into alternate nostrils daily.  . calcium-vitamin D (OSCAL WITH D) 500-200 MG-UNIT tablet Take 2 tablets by mouth daily with breakfast.  . cephALEXin (KEFLEX) 500 MG capsule Take 1 capsule (500 mg total) by mouth 3 (three) times daily.  . clobetasol cream (TEMOVATE) 7.62 % Apply 1 application topically 2 (two) times daily.  . clopidogrel (PLAVIX) 75 MG tablet TAKE 1 TABLET BY MOUTH  DAILY  .  diclofenac sodium (VOLTAREN) 1 % GEL APP 1/2 GRAM USING DOSING CARD TO AFFECTED ARTHRITIS SITE TID  . diltiazem (CARDIZEM) 90 MG tablet Take by mouth.  . fluconazole (DIFLUCAN) 150 MG tablet Take 1 tablet (150 mg total) by mouth daily. As needed for vaginal symptoms  . FLUVIRIN SUSP Inject as directed once.   . ibandronate (BONIVA) 150 MG tablet   . isosorbide mononitrate (IMDUR) 30 MG 24 hr tablet Take by mouth.  . losartan (COZAAR) 25 MG tablet   . Melatonin 1 MG TABS Take 1 tablet (1 mg total) by mouth Nightly.  . nitroGLYCERIN (NITROSTAT) 0.4 MG SL tablet Place 1 tablet (0.4 mg total) under the tongue every 5 (five) minutes as needed for chest pain.  Marland Kitchen oxyCODONE-acetaminophen (PERCOCET) 10-325 MG tablet Take 1 tablet by mouth every 8 (eight) hours as needed for pain.  . pantoprazole (PROTONIX) 40 MG tablet TAKE 1 TABLET BY MOUTH TWO  TIMES DAILY  . PROLIA 60 MG/ML SOLN injection Inject 60 mg into the skin every 6 (six) months.   . traMADol (ULTRAM) 50 MG tablet Take 1 tablet (50 mg total) by mouth every 6 (six) hours as needed.  . venlafaxine XR (EFFEXOR-XR) 75 MG 24 hr capsule TAKE 1 CAPSULE BY MOUTH  DAILY WITH BREAKFAST  . warfarin (COUMADIN) 2 MG tablet TAKE 2 TABLETS BY MOUTH EVERY DAY  . warfarin (COUMADIN) 5 MG tablet Take 1 tablet (5 mg total) by mouth daily at 6 PM.    Past Medical History:  Diagnosis Date  .  Depression   . GERD (gastroesophageal reflux disease)   . History of blood clots   . Osteoporosis   . Stomach ulcer   . Vascular disease    Sees Dr. Delana Meyer  . Vertigo    Last episode approx Aug 2015    Past Surgical History:  Procedure Laterality Date  . BREAST CYST ASPIRATION Left   . HEMORROIDECTOMY  2014  . TONSILECTOMY, ADENOIDECTOMY, BILATERAL MYRINGOTOMY AND TUBES  1968  . VASCULAR SURGERY  6045,4098   Fem-Pop Bypass    Social History Social History   Tobacco Use  . Smoking status: Former Smoker    Packs/day: 2.00    Years: 25.00    Pack years:  50.00    Types: Cigarettes    Last attempt to quit: 08/22/1991    Years since quitting: 25.8  . Smokeless tobacco: Never Used  Substance Use Topics  . Alcohol use: No    Alcohol/week: 0.0 oz  . Drug use: No    Family History Family History  Problem Relation Age of Onset  . CVA Mother   . Heart attack Mother   . Cancer Father        colon cancer  . Colon cancer Father   . Heart disease Maternal Uncle   . Breast cancer Neg Hx     Allergies  Allergen Reactions  . Amoxicillin Other (See Comments)    Yeast infection  . Vicodin [Hydrocodone-Acetaminophen] Hives and Rash     REVIEW OF SYSTEMS (Negative unless checked)  Constitutional: [] Weight loss  [] Fever  [] Chills Cardiac: [] Chest pain   [] Chest pressure   [] Palpitations   [] Shortness of breath when laying flat   [] Shortness of breath with exertion. Vascular:  [x] Pain in legs with walking   [] Pain in legs at rest  [] History of DVT   [] Phlebitis   [] Swelling in legs   [] Varicose veins   [] Non-healing ulcers Pulmonary:   [] Uses home oxygen   [] Productive cough   [] Hemoptysis   [] Wheeze  [] COPD   [] Asthma Neurologic:  [] Dizziness   [] Seizures   [] History of stroke   [] History of TIA  [] Aphasia   [] Vissual changes   [] Weakness or numbness in arm   [] Weakness or numbness in leg Musculoskeletal:   [] Joint swelling   [] Joint pain   [] Low back pain Hematologic:  [] Easy bruising  [] Easy bleeding   [] Hypercoagulable state   [] Anemic Gastrointestinal:  [] Diarrhea   [] Vomiting  [] Gastroesophageal reflux/heartburn   [] Difficulty swallowing. Genitourinary:  [] Chronic kidney disease   [] Difficult urination  [] Frequent urination   [] Blood in urine Skin:  [] Rashes   [] Ulcers  Psychological:  [] History of anxiety   []  History of major depression.  Physical Examination  Vitals:   07/02/17 1108 07/02/17 1109  BP: 139/85 (!) 153/84  Pulse: 80 68  Resp: 15   Weight: 135 lb (61.2 kg)   Height: 5' 4.5" (1.638 m)    Body mass index is 22.81  kg/m. Gen: WD/WN, NAD Head: Platea/AT, No temporalis wasting.  Ear/Nose/Throat: Hearing grossly intact, nares w/o erythema or drainage Eyes: PER, EOMI, sclera nonicteric.  Neck: Supple, no large masses.   Pulmonary:  Good air movement, no audible wheezing bilaterally, no use of accessory muscles.  Cardiac: RRR, no JVD Vascular: No carotid bruits Vessel Right Left  Radial Palpable Palpable  Carotid Palpable Palpable  PT Trace Palpable Trace Palpable  DP No Palpable No Palpable  Gastrointestinal: Non-distended. No guarding/no peritoneal signs.  Musculoskeletal: M/S 5/5 throughout.  No deformity  or atrophy.  Neurologic: CN 2-12 intact. Symmetrical.  Speech is fluent. Motor exam as listed above. Psychiatric: Judgment intact, Mood & affect appropriate for pt's clinical situation. Dermatologic: No rashes or ulcers noted.  No changes consistent with cellulitis. Lymph : No lichenification or skin changes of chronic lymphedema.  CBC Lab Results  Component Value Date   WBC 16.2 (H) 02/14/2016   HGB 12.7 02/14/2016   HCT 37.5 02/14/2016   MCV 95.2 02/14/2016   PLT 116 (L) 02/14/2016    BMET    Component Value Date/Time   NA 138 05/16/2016 1034   NA 139 09/21/2015 0916   K 4.5 05/16/2016 1034   CL 106 05/16/2016 1034   CO2 21 05/16/2016 1034   GLUCOSE 103 (H) 05/16/2016 1034   BUN 12 05/16/2016 1034   BUN 12 09/21/2015 0916   CREATININE 0.88 05/16/2016 1034   CALCIUM 8.8 05/16/2016 1034   GFRNONAA 66 05/01/2016 0846   GFRAA 76 05/01/2016 0846   CrCl cannot be calculated (Patient's most recent lab result is older than the maximum 21 days allowed.).  COAG Lab Results  Component Value Date   INR 2.44 06/26/2017   INR 2.01 06/19/2017   INR 2.70 06/05/2017    Radiology No results found.   Assessment/Plan 1. Peripheral arterial occlusive disease (HCC)  Recommend:  The patient has evidence of atherosclerosis of the lower extremities with claudication.  The patient does not  voice lifestyle limiting changes at this point in time.  Noninvasive studies do not suggest clinically significant change.  No invasive studies, angiography or surgery at this time The patient should continue walking and begin a more formal exercise program.  The patient should continue antiplatelet therapy and aggressive treatment of the lipid abnormalities  No changes in the patient's medications at this time  The patient should continue wearing graduated compression socks 10-15 mmHg strength to control the mild edema.   - VAS [place] LOWER EXTREMITY ARTERIAL DUPLEX; Future - VAS [place] ABI WITH/WO TBI; Future  2. Bilateral carotid artery stenosis Recommend:  Given the patient's asymptomatic subcritical stenosis no further invasive testing or surgery at this time.  Continue antiplatelet therapy as prescribed Continue management of CAD, HTN and Hyperlipidemia Healthy heart diet,  encouraged exercise at least 4 times per week  3. Coronary artery disease involving native coronary artery of native heart with angina pectoris ([place]) Continue cardiac and antihypertensive medications as already ordered and reviewed, no changes at this time.  Continue statin as ordered and reviewed, no changes at this time  Nitrates PRN for chest pain   4. Essential hypertension Continue antihypertensive medications as already ordered, these medications have been reviewed and there are no changes at this time.   5. Other hyperlipidemia Continue statin as ordered and reviewed, no changes at this time     [name], MD  07/02/2017 9:14 PM

## 2017-07-09 ENCOUNTER — Other Ambulatory Visit: Payer: Self-pay | Admitting: Nurse Practitioner

## 2017-07-09 DIAGNOSIS — Z1231 Encounter for screening mammogram for malignant neoplasm of breast: Secondary | ICD-10-CM

## 2017-07-10 ENCOUNTER — Other Ambulatory Visit
Admission: RE | Admit: 2017-07-10 | Discharge: 2017-07-10 | Disposition: A | Discharge status: Home / Self Care | Payer: Medicare Other | Source: Ambulatory Visit | Attending: Vascular Surgery | Admitting: Vascular Surgery

## 2017-07-10 ENCOUNTER — Other Ambulatory Visit (INDEPENDENT_AMBULATORY_CARE_PROVIDER_SITE_OTHER): Payer: Self-pay | Admitting: Vascular Surgery

## 2017-07-10 DIAGNOSIS — I779 Disorder of arteries and arterioles, unspecified: Secondary | ICD-10-CM

## 2017-07-10 LAB — PROTIME-INR
INR: 2.48
PROTHROMBIN TIME: 26.6 s — AB (ref 11.4–15.2)

## 2017-07-24 ENCOUNTER — Other Ambulatory Visit
Admission: RE | Admit: 2017-07-24 | Discharge: 2017-07-24 | Disposition: A | Discharge status: Home / Self Care | Payer: Medicare Other | Source: Ambulatory Visit | Attending: Vascular Surgery | Admitting: Vascular Surgery

## 2017-07-24 DIAGNOSIS — I779 Disorder of arteries and arterioles, unspecified: Secondary | ICD-10-CM | POA: Diagnosis present

## 2017-07-24 DIAGNOSIS — I739 Peripheral vascular disease, unspecified: Secondary | ICD-10-CM

## 2017-07-24 LAB — PROTIME-INR
INR: 2.02
PROTHROMBIN TIME: 22.7 s — AB (ref 11.4–15.2)

## 2017-08-07 ENCOUNTER — Ambulatory Visit
Admission: RE | Admit: 2017-08-07 | Discharge: 2017-08-07 | Disposition: A | Discharge status: Home / Self Care | Payer: Medicare Other | Source: Ambulatory Visit | Attending: Nurse Practitioner | Admitting: Nurse Practitioner

## 2017-08-07 ENCOUNTER — Other Ambulatory Visit (INDEPENDENT_AMBULATORY_CARE_PROVIDER_SITE_OTHER): Payer: Self-pay | Admitting: Vascular Surgery

## 2017-08-07 ENCOUNTER — Telehealth (INDEPENDENT_AMBULATORY_CARE_PROVIDER_SITE_OTHER): Payer: Self-pay | Admitting: Vascular Surgery

## 2017-08-07 ENCOUNTER — Other Ambulatory Visit
Admission: RE | Admit: 2017-08-07 | Discharge: 2017-08-07 | Disposition: A | Discharge status: Home / Self Care | Payer: Medicare Other | Source: Ambulatory Visit | Attending: Vascular Surgery | Admitting: Vascular Surgery

## 2017-08-07 DIAGNOSIS — Z1231 Encounter for screening mammogram for malignant neoplasm of breast: Secondary | ICD-10-CM | POA: Insufficient documentation

## 2017-08-07 DIAGNOSIS — I739 Peripheral vascular disease, unspecified: Secondary | ICD-10-CM

## 2017-08-07 LAB — PROTIME-INR
INR: 2.57
PROTHROMBIN TIME: 27.4 s — AB (ref 11.4–15.2)

## 2017-08-07 NOTE — Telephone Encounter (Signed)
Orders were put in and the patient has had her inr check

## 2017-08-07 NOTE — Telephone Encounter (Signed)
New Message  Pt verbalized she is suppose to be have INR's but there are no labs in the system.  Pt is very upset and stated she comes every two weeks and needs the orders in the system.  Pt & Nurse verbalized labs were drawn on 12.4.18.  Please f/u

## 2017-08-09 ENCOUNTER — Other Ambulatory Visit (INDEPENDENT_AMBULATORY_CARE_PROVIDER_SITE_OTHER): Payer: Self-pay | Admitting: Vascular Surgery

## 2017-08-22 ENCOUNTER — Other Ambulatory Visit
Admission: RE | Admit: 2017-08-22 | Discharge: 2017-08-22 | Disposition: A | Discharge status: Home / Self Care | Payer: Medicare Other | Source: Ambulatory Visit | Attending: Vascular Surgery | Admitting: Vascular Surgery

## 2017-08-22 DIAGNOSIS — I739 Peripheral vascular disease, unspecified: Secondary | ICD-10-CM | POA: Insufficient documentation

## 2017-08-22 LAB — PROTIME-INR
INR: 2.67
Prothrombin Time: 28.2 seconds — ABNORMAL HIGH (ref 11.4–15.2)

## 2017-09-04 ENCOUNTER — Other Ambulatory Visit
Admission: RE | Admit: 2017-09-04 | Discharge: 2017-09-04 | Disposition: A | Discharge status: Home / Self Care | Payer: Medicare Other | Source: Ambulatory Visit | Attending: Vascular Surgery | Admitting: Vascular Surgery

## 2017-09-04 ENCOUNTER — Telehealth (INDEPENDENT_AMBULATORY_CARE_PROVIDER_SITE_OTHER): Payer: Self-pay | Admitting: Vascular Surgery

## 2017-09-04 DIAGNOSIS — I739 Peripheral vascular disease, unspecified: Secondary | ICD-10-CM

## 2017-09-04 LAB — PROTIME-INR
INR: 2.33
Prothrombin Time: 25.4 seconds — ABNORMAL HIGH (ref 11.4–15.2)

## 2017-09-04 NOTE — Progress Notes (Signed)
I called the patient and inform her with the inr results and recheck in 2wks

## 2017-09-04 NOTE — Telephone Encounter (Signed)
New Message  Sara [name] verbalized she is needing to speak to PA in regards to INR order.  Please f/u

## 2017-09-13 ENCOUNTER — Telehealth: Payer: Self-pay

## 2017-09-13 ENCOUNTER — Other Ambulatory Visit: Payer: Self-pay

## 2017-09-13 DIAGNOSIS — Z1211 Encounter for screening for malignant neoplasm of colon: Secondary | ICD-10-CM

## 2017-09-13 NOTE — Telephone Encounter (Signed)
Gastroenterology Pre-Procedure Review  Request Date: 10/08/17 Requesting Physician: Dr. Allen Norris  PATIENT REVIEW QUESTIONS: The patient responded to the following health history questions as indicated:    1. Are you having any GI issues? no 2. Do you have a personal history of Polyps? no 3. Do you have a family history of Colon Cancer or Polyps? yes (dad colon cancer) 4. Diabetes Mellitus? no 5. Joint replacements in the past 12 months?no 6. Major health problems in the past 3 months?no 7. Any artificial heart valves, MVP, or defibrillator?no    MEDICATIONS & ALLERGIES:    Patient reports the following regarding taking any anticoagulation/antiplatelet therapy:   Plavix, Coumadin, Eliquis, Xarelto, Lovenox, Pradaxa, Brilinta, or Effient? yes (Coumadin- blood thinner request sent to Dr. Delana Meyer) Aspirin? no  Patient confirms/reports the following medications:  Current Outpatient Medications  Medication Sig Dispense Refill  . albuterol (PROVENTIL HFA;VENTOLIN HFA) 108 (90 Base) MCG/ACT inhaler Inhale 2 puffs into the lungs every 6 (six) hours as needed for wheezing or shortness of breath. 1 Inhaler 0  . atorvastatin (LIPITOR) 10 MG tablet Take 1 tablet by mouth  daily 90 tablet 3  . atorvastatin (LIPITOR) 40 MG tablet     . buPROPion (WELLBUTRIN XL) 300 MG 24 hr tablet Take 1 tablet (300 mg total) by mouth daily. 90 tablet 3  . calcitonin, salmon, (MIACALCIN/FORTICAL) 200 UNIT/ACT nasal spray Place 1 spray into alternate nostrils daily. 3.7 mL 1  . calcium-vitamin D (OSCAL WITH D) 500-200 MG-UNIT tablet Take 2 tablets by mouth daily with breakfast. 60 tablet 11  . cephALEXin (KEFLEX) 500 MG capsule Take 1 capsule (500 mg total) by mouth 3 (three) times daily. 15 capsule 0  . clobetasol cream (TEMOVATE) 7.35 % Apply 1 application topically 2 (two) times daily. 30 g 0  . clopidogrel (PLAVIX) 75 MG tablet TAKE 1 TABLET BY MOUTH  DAILY 90 tablet 11  . diclofenac sodium (VOLTAREN) 1 % GEL APP 1/2  GRAM USING DOSING CARD TO AFFECTED ARTHRITIS SITE TID  5  . diltiazem (CARDIZEM) 90 MG tablet Take by mouth.    . fluconazole (DIFLUCAN) 150 MG tablet Take 1 tablet (150 mg total) by mouth daily. As needed for vaginal symptoms 1 tablet 0  . FLUVIRIN SUSP Inject as directed once.     . ibandronate (BONIVA) 150 MG tablet     . isosorbide mononitrate (IMDUR) 30 MG 24 hr tablet Take by mouth.    . losartan (COZAAR) 25 MG tablet     . Melatonin 1 MG TABS Take 1 tablet (1 mg total) by mouth Nightly.    . metoprolol tartrate (LOPRESSOR) 25 MG tablet Take 0.5 tablets (12.5 mg total) by mouth 2 (two) times daily. 90 tablet 3  . nitroGLYCERIN (NITROSTAT) 0.4 MG SL tablet Place 1 tablet (0.4 mg total) under the tongue every 5 (five) minutes as needed for chest pain. 25 tablet 6  . oxyCODONE-acetaminophen (PERCOCET) 10-325 MG tablet Take 1 tablet by mouth every 8 (eight) hours as needed for pain. 30 tablet 0  . pantoprazole (PROTONIX) 40 MG tablet TAKE 1 TABLET BY MOUTH TWO  TIMES DAILY 180 tablet 1  . PROLIA 60 MG/ML SOLN injection Inject 60 mg into the skin every 6 (six) months.     . venlafaxine XR (EFFEXOR-XR) 75 MG 24 hr capsule TAKE 1 CAPSULE BY MOUTH  DAILY WITH BREAKFAST 90 capsule 3  . warfarin (COUMADIN) 2 MG tablet Take 1 tablet (2 mg total) by mouth daily. 60 tablet  0   No current facility-administered medications for this visit.     Patient confirms/reports the following allergies:  Allergies  Allergen Reactions  . Amoxicillin Other (See Comments)    Yeast infection  . Vicodin [Hydrocodone-Acetaminophen] Hives and Rash    No orders of the defined types were placed in this encounter.   AUTHORIZATION INFORMATION Primary Insurance: 1D#: Group #:  Secondary Insurance: 1D#: Group #:  SCHEDULE INFORMATION: Date: 10/08/17 Time: Location:[facility]

## 2017-09-16 ENCOUNTER — Other Ambulatory Visit (INDEPENDENT_AMBULATORY_CARE_PROVIDER_SITE_OTHER): Payer: Self-pay | Admitting: Vascular Surgery

## 2017-10-01 ENCOUNTER — Telehealth: Payer: Self-pay

## 2017-10-01 ENCOUNTER — Other Ambulatory Visit: Payer: Self-pay

## 2017-10-01 NOTE — Telephone Encounter (Signed)
Patient has been advised by voice message to stop blood thinner 3 days prior to colonoscopy and resume 2 days after procedure per fax received by Dr. Delana Meyer on 09/28/17.  Thanks [employer]

## 2017-10-01 NOTE — Telephone Encounter (Signed)
Patient has requested to change her colonoscopy date from 10/08/17 to 10/22/17 with Dr. Allen Norris. Maudie Mercury in [place] has been informed of schedule change.

## 2017-10-08 ENCOUNTER — Other Ambulatory Visit
Admission: RE | Admit: 2017-10-08 | Discharge: 2017-10-08 | Disposition: A | Discharge status: Home / Self Care | Payer: Medicare Other | Source: Ambulatory Visit | Attending: Vascular Surgery | Admitting: Vascular Surgery

## 2017-10-08 DIAGNOSIS — I739 Peripheral vascular disease, unspecified: Secondary | ICD-10-CM | POA: Diagnosis present

## 2017-10-08 LAB — PROTIME-INR
INR: 2.12
Prothrombin Time: 23.6 seconds — ABNORMAL HIGH (ref 11.4–15.2)

## 2017-10-09 ENCOUNTER — Other Ambulatory Visit (INDEPENDENT_AMBULATORY_CARE_PROVIDER_SITE_OTHER): Payer: Self-pay | Admitting: Vascular Surgery

## 2017-10-09 DIAGNOSIS — I739 Peripheral vascular disease, unspecified: Secondary | ICD-10-CM

## 2017-10-16 ENCOUNTER — Encounter: Payer: Self-pay | Admitting: *Deleted

## 2017-10-16 ENCOUNTER — Other Ambulatory Visit: Payer: Self-pay

## 2017-10-16 MED ORDER — PEG 3350-KCL-NA BICARB-NACL 420 G PO SOLR: 4000.0000 mL | Freq: Once | ORAL | 0 refills | Status: AC

## 2017-10-16 NOTE — Progress Notes (Signed)
gol 

## 2017-10-18 NOTE — Discharge Instructions (Signed)
General Anesthesia, Adult, Care After °These instructions provide you with information about caring for yourself after your procedure. Your health care provider may also give you more specific instructions. Your treatment has been planned according to current medical practices, but problems sometimes occur. Call your health care provider if you have any problems or questions after your procedure. °What can I expect after the procedure? °After the procedure, it is common to have: °· Vomiting. °· A sore throat. °· Mental slowness. ° °It is common to feel: °· Nauseous. °· Cold or shivery. °· Sleepy. °· Tired. °· Sore or achy, even in parts of your body where you did not have surgery. ° °Follow these instructions at home: °For at least 24 hours after the procedure: °· Do not: °? Participate in activities where you could fall or become injured. °? Drive. °? Use heavy machinery. °? Drink alcohol. °? Take sleeping pills or medicines that cause drowsiness. °? Make important decisions or sign legal documents. °? Take care of children on your own. °· Rest. °Eating and drinking °· If you vomit, drink water, juice, or soup when you can drink without vomiting. °· Drink enough fluid to keep your urine clear or pale yellow. °· Make sure you have little or no nausea before eating solid foods. °· Follow the diet recommended by your health care provider. °General instructions °· Have a responsible adult stay with you until you are awake and alert. °· Return to your normal activities as told by your health care provider. Ask your health care provider what activities are safe for you. °· Take over-the-counter and prescription medicines only as told by your health care provider. °· If you smoke, do not smoke without supervision. °· Keep all follow-up visits as told by your health care provider. This is important. °Contact a health care provider if: °· You continue to have nausea or vomiting at home, and medicines are not helpful. °· You  cannot drink fluids or start eating again. °· You cannot urinate after 8-12 hours. °· You develop a skin rash. °· You have fever. °· You have increasing redness at the site of your procedure. °Get help right away if: °· You have difficulty breathing. °· You have chest pain. °· You have unexpected bleeding. °· You feel that you are having a life-threatening or urgent problem. °This information is not intended to replace advice given to you by your health care provider. Make sure you discuss any questions you have with your health care provider. °Document Released: 11/13/2000 Document Revised: 01/10/2016 Document Reviewed: 07/22/2015 °Elsevier Interactive Patient Education © 2018 [employer] Inc[employer] ° °

## 2017-10-22 ENCOUNTER — Ambulatory Visit: Payer: Medicare Other | Admitting: Anesthesiology

## 2017-10-22 ENCOUNTER — Ambulatory Visit
Admission: RE | Admit: 2017-10-22 | Discharge: 2017-10-22 | Disposition: A | Discharge status: Home / Self Care | Payer: Medicare Other | Source: Ambulatory Visit | Attending: Gastroenterology | Admitting: Gastroenterology

## 2017-10-22 ENCOUNTER — Encounter
Admission: RE | Disposition: A | Discharge status: Home / Self Care | Payer: Self-pay | Source: Ambulatory Visit | Attending: Gastroenterology

## 2017-10-22 DIAGNOSIS — Z7902 Long term (current) use of antithrombotics/antiplatelets: Secondary | ICD-10-CM | POA: Insufficient documentation

## 2017-10-22 DIAGNOSIS — Z880 Allergy status to penicillin: Secondary | ICD-10-CM | POA: Insufficient documentation

## 2017-10-22 DIAGNOSIS — Z79899 Other long term (current) drug therapy: Secondary | ICD-10-CM | POA: Insufficient documentation

## 2017-10-22 DIAGNOSIS — K219 Gastro-esophageal reflux disease without esophagitis: Secondary | ICD-10-CM | POA: Diagnosis not present

## 2017-10-22 DIAGNOSIS — Z7983 Long term (current) use of bisphosphonates: Secondary | ICD-10-CM | POA: Diagnosis not present

## 2017-10-22 DIAGNOSIS — F329 Major depressive disorder, single episode, unspecified: Secondary | ICD-10-CM | POA: Diagnosis not present

## 2017-10-22 DIAGNOSIS — Z888 Allergy status to other drugs, medicaments and biological substances status: Secondary | ICD-10-CM | POA: Insufficient documentation

## 2017-10-22 DIAGNOSIS — Z86718 Personal history of other venous thrombosis and embolism: Secondary | ICD-10-CM | POA: Insufficient documentation

## 2017-10-22 DIAGNOSIS — Z1211 Encounter for screening for malignant neoplasm of colon: Secondary | ICD-10-CM | POA: Insufficient documentation

## 2017-10-22 DIAGNOSIS — Z823 Family history of stroke: Secondary | ICD-10-CM | POA: Insufficient documentation

## 2017-10-22 DIAGNOSIS — Z8249 Family history of ischemic heart disease and other diseases of the circulatory system: Secondary | ICD-10-CM | POA: Diagnosis not present

## 2017-10-22 DIAGNOSIS — Z800 Family history of malignant neoplasm of digestive organs: Secondary | ICD-10-CM | POA: Insufficient documentation

## 2017-10-22 DIAGNOSIS — Z7901 Long term (current) use of anticoagulants: Secondary | ICD-10-CM | POA: Insufficient documentation

## 2017-10-22 DIAGNOSIS — Z83719 Family history of colon polyps, unspecified: Secondary | ICD-10-CM

## 2017-10-22 DIAGNOSIS — Z8371 Family history of colonic polyps: Secondary | ICD-10-CM | POA: Diagnosis not present

## 2017-10-22 DIAGNOSIS — D122 Benign neoplasm of ascending colon: Secondary | ICD-10-CM

## 2017-10-22 HISTORY — DX: Presence of dental prosthetic device (complete) (partial): Z97.2

## 2017-10-22 HISTORY — PX: COLONOSCOPY WITH PROPOFOL: SHX5780

## 2017-10-22 HISTORY — DX: Nonrheumatic mitral (valve) insufficiency: I34.0

## 2017-10-22 SURGERY — COLONOSCOPY WITH PROPOFOL
Anesthesia: General | Site: Rectum | Wound class: Dirty or Infected

## 2017-10-22 MED ORDER — STERILE WATER FOR IRRIGATION IR SOLN
Status: DC | PRN
  Administered 2017-10-22: 10:00:00

## 2017-10-22 MED ORDER — LIDOCAINE HCL (CARDIAC) 20 MG/ML IV SOLN
INTRAVENOUS | Status: DC | PRN
  Administered 2017-10-22: 30 mg via INTRAVENOUS

## 2017-10-22 MED ORDER — PROPOFOL 10 MG/ML IV BOLUS
INTRAVENOUS | Status: DC | PRN
  Administered 2017-10-22: 100 mg via INTRAVENOUS
  Administered 2017-10-22: 20 mg via INTRAVENOUS
  Administered 2017-10-22 (×2): 30 mg via INTRAVENOUS
  Administered 2017-10-22: 20 mg via INTRAVENOUS

## 2017-10-22 MED ORDER — LACTATED RINGERS IV SOLN
10.0000 mL/h | INTRAVENOUS | Status: DC
  Administered 2017-10-22: 10 mL/h via INTRAVENOUS

## 2017-10-22 MED ORDER — SODIUM CHLORIDE 0.9 % IV SOLN: INTRAVENOUS | Status: DC

## 2017-10-22 SURGICAL SUPPLY — 24 items
CANISTER SUCT 1200ML W/VALVE (MISCELLANEOUS) ×2 IMPLANT
CLIP HMST 235XBRD CATH ROT (MISCELLANEOUS) IMPLANT
CLIP RESOLUTION 360 11X235 (MISCELLANEOUS)
ELECT REM PT RETURN 9FT ADLT (ELECTROSURGICAL)
ELECTRODE REM PT RTRN 9FT ADLT (ELECTROSURGICAL) IMPLANT
FCP ESCP3.2XJMB 240X2.8X (MISCELLANEOUS)
FORCEPS BIOP RAD 4 LRG CAP 4 (CUTTING FORCEPS) IMPLANT
FORCEPS BIOP RJ4 240 W/NDL (MISCELLANEOUS)
FORCEPS ESCP3.2XJMB 240X2.8X (MISCELLANEOUS) IMPLANT
GOWN CVR UNV OPN BCK APRN NK (MISCELLANEOUS) ×2 IMPLANT
GOWN ISOL THUMB LOOP REG UNIV (MISCELLANEOUS) ×2
INJECTOR VARIJECT VIN23 (MISCELLANEOUS) IMPLANT
KIT DEFENDO VALVE AND CONN (KITS) IMPLANT
KIT ENDO PROCEDURE OLY (KITS) ×2 IMPLANT
MARKER SPOT ENDO TATTOO 5ML (MISCELLANEOUS) IMPLANT
PROBE APC STR FIRE (PROBE) IMPLANT
RETRIEVER NET ROTH 2.5X230 LF (MISCELLANEOUS) IMPLANT
SNARE SHORT THROW 13M SML OVAL (MISCELLANEOUS) IMPLANT
SNARE SHORT THROW 30M LRG OVAL (MISCELLANEOUS) IMPLANT
SNARE SNG USE RND 15MM (INSTRUMENTS) IMPLANT
SPOT EX ENDOSCOPIC TATTOO (MISCELLANEOUS)
TRAP ETRAP POLY (MISCELLANEOUS) IMPLANT
VARIJECT INJECTOR VIN23 (MISCELLANEOUS)
WATER STERILE IRR 250ML POUR (IV SOLUTION) ×2 IMPLANT

## 2017-10-22 NOTE — H&P (Signed)
Lucilla Lame, MD [facility] [address] [place], [place] [address] [phone] Fax : [phone]  Primary Care Physician:  Danelle Berry, NP Primary Gastroenterologist:  Dr. Allen Norris  Pre-Procedure History & Physical: HPI:  Sara [name] is a 67 y.o. female is here for an colonoscopy.   Past Medical History:  Diagnosis Date  . Depression   . GERD (gastroesophageal reflux disease)   . History of blood clots   . Mild mitral regurgitation   . Osteoporosis   . Stomach ulcer   . Vascular disease    Sees Dr. Delana Meyer  . Vertigo    Last episode approx Aug 2015  . Wears dentures    full upper    Past Surgical History:  Procedure Laterality Date  . BREAST CYST ASPIRATION Left   . CARDIAC CATHETERIZATION  02/15/2017   [facility]  . ESOPHAGOGASTRODUODENOSCOPY N/A 12/21/2014   Procedure: ESOPHAGOGASTRODUODENOSCOPY (EGD);  Surgeon: Lucilla Lame, MD;  Location: [place];  Service: Gastroenterology;  Laterality: N/A;  . ESOPHAGOGASTRODUODENOSCOPY (EGD) WITH PROPOFOL N/A 02/08/2016   Procedure: ESOPHAGOGASTRODUODENOSCOPY (EGD) WITH PROPOFOL;  Surgeon: Lucilla Lame, MD;  Location: [facility];  Service: Endoscopy;  Laterality: N/A;  . HEMORROIDECTOMY  2014  . PERIPHERAL VASCULAR CATHETERIZATION N/A 02/09/2016   Procedure: Abdominal Aortogram w/Lower Extremity;  Surgeon: [name], MD;  Location: [place] [facility];  Service: Cardiovascular;  Laterality: N/A;  . PERIPHERAL VASCULAR CATHETERIZATION Right 02/10/2016   Procedure: Lower Extremity Angiography;  Surgeon: [name], MD;  Location: [place] [facility];  Service: Cardiovascular;  Laterality: Right;  . TONSILECTOMY, ADENOIDECTOMY, BILATERAL MYRINGOTOMY AND TUBES  1968  . VASCULAR SURGERY  [phone]   Fem-Pop Bypass    Prior to Admission medications   Medication Sig Start Date End Date Taking? Authorizing Provider  atorvastatin (LIPITOR) 40 MG tablet  12/08/16  Yes [provider]    clobetasol cream (TEMOVATE) 0.09 % Apply 1 application topically 2 (two) times daily. 12/06/15  Yes Krebs, Amy Lauren, NP  clopidogrel (PLAVIX) 75 MG tablet TAKE 1 TABLET BY MOUTH  DAILY 11/02/16  Yes Schnier, Dolores Lory, MD  diclofenac sodium (VOLTAREN) 1 % GEL APP 1/2 GRAM USING DOSING CARD TO AFFECTED ARTHRITIS SITE TID 06/15/17  Yes [provider]  ibandronate (BONIVA) 150 MG tablet  12/07/16  Yes [provider]  losartan (COZAAR) 25 MG tablet  12/01/16  Yes [provider]  pantoprazole (PROTONIX) 40 MG tablet TAKE 1 TABLET BY MOUTH TWO  TIMES DAILY 06/07/16  Yes Krebs, Amy Lauren, NP  venlafaxine XR (EFFEXOR-XR) 75 MG 24 hr capsule TAKE 1 CAPSULE BY MOUTH  DAILY WITH BREAKFAST 06/07/16  Yes Krebs, Amy Lauren, NP  warfarin (COUMADIN) 2 MG tablet TAKE 1 TABLET(2 MG) BY MOUTH DAILY 09/17/17  Yes Stegmayer, Joelene Millin A, PA-C  atorvastatin (LIPITOR) 10 MG tablet Take 1 tablet by mouth  daily Patient not taking: Reported on 10/16/2017 02/23/16   Luciana Axe, NP  diltiazem (CARDIZEM) 90 MG tablet Take by mouth. 01/24/17   [provider]  FLUVIRIN SUSP Inject as directed once.  04/28/16   [provider]  isosorbide mononitrate (IMDUR) 30 MG 24 hr tablet Take by mouth. 01/24/17   [provider]  metoprolol tartrate (LOPRESSOR) 25 MG tablet Take 0.5 tablets (12.5 mg total) by mouth 2 (two) times daily. 07/03/16 10/01/16  Wende Bushy, MD  nitroGLYCERIN (NITROSTAT) 0.4 MG SL tablet Place 1 tablet (0.4 mg total) under the tongue every 5 (five) minutes  as needed for chest pain. Patient not taking: Reported on 10/16/2017 01/27/16   Wende Bushy, MD  PROLIA 60 MG/ML SOLN injection Inject 60 mg into the skin every 6 (six) months.  04/27/16   [provider]    Allergies as of 09/13/2017 - Review Complete 07/02/2017  Allergen Reaction Noted  . Amoxicillin Other (See Comments) 03/09/2015  . Vicodin [hydrocodone-acetaminophen] Hives and Rash 12/18/2014     Family History  Problem Relation Age of Onset  . CVA Mother   . Heart attack Mother   . Cancer Father        colon cancer  . Colon cancer Father   . Heart disease Maternal Uncle   . Breast cancer Neg Hx     Social History   Socioeconomic History  . Marital status: Single    Spouse name: Not on file  . Number of children: Not on file  . Years of education: Not on file  . Highest education level: Not on file  Social Needs  . Financial resource strain: Not on file  . Food insecurity - worry: Not on file  . Food insecurity - inability: Not on file  . Transportation needs - medical: Not on file  . Transportation needs - non-medical: Not on file  Occupational History  . Not on file  Tobacco Use  . Smoking status: Former Smoker    Packs/day: 2.00    Years: 25.00    Pack years: 50.00    Types: Cigarettes    Last attempt to quit: 08/22/1991    Years since quitting: 26.1  . Smokeless tobacco: Never Used  Substance and Sexual Activity  . Alcohol use: No    Alcohol/week: 0.0 oz  . Drug use: No  . Sexual activity: Not on file  Other Topics Concern  . Not on file  Social History Narrative  . Not on file    Review of Systems: See HPI, otherwise negative ROS  Physical Exam: BP 128/71   Pulse 93   Temp 97.9 F (36.6 C) (Temporal)   Resp 16   Ht 5' 4.5" (1.638 m)   Wt 137 lb (62.1 kg)   SpO2 99%   BMI 23.15 kg/m  General:   Alert,  pleasant and cooperative in NAD Head:  Normocephalic and atraumatic. Neck:  Supple; no masses or thyromegaly. Lungs:  Clear throughout to auscultation.    Heart:  Regular rate and rhythm. Abdomen:  Soft, nontender and nondistended. Normal bowel sounds, without guarding, and without rebound.   Neurologic:  Alert and  oriented x4;  grossly normal neurologically.  Impression/Plan: Nekisha Mcdiarmid is here for an colonoscopy to be performed for family history of colon polyps  Risks, benefits, limitations, and alternatives regarding   colonoscopy have been reviewed with the patient.  Questions have been answered.  All parties agreeable.   Lucilla Lame, MD  10/22/2017, 8:50 AM

## 2017-10-22 NOTE — Transfer of Care (Signed)
Immediate Anesthesia Transfer of Care Note  Patient: Sara [name]  Procedure(s) Performed: COLONOSCOPY WITH PROPOFOL (N/A Rectum)  Patient Location: PACU  Anesthesia Type: General  Level of Consciousness: awake, alert  and patient cooperative  Airway and Oxygen Therapy: Patient Spontanous Breathing and Patient connected to supplemental oxygen  Post-op Assessment: Post-op Vital signs reviewed, Patient's Cardiovascular Status Stable, Respiratory Function Stable, Patent Airway and No signs of Nausea or vomiting  Post-op Vital Signs: Reviewed and stable  Complications: No apparent anesthesia complications

## 2017-10-22 NOTE — Anesthesia Preprocedure Evaluation (Addendum)
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)  Normal cardiovascular exam+ Valvular Problems/Murmurs (mild MR)    echo: 01/2017:  Normal left ventricular systolic function, ejection fraction > 55%  Degenerative mitral valve disease  Mitral regurgitation - mild  Normal right ventricular systolic function;  cath: 01/2017: no CAD;  ekg: 01/2017: nsr;   Lipids;   Neuro/Psych Anxiety Depression L3 fx > healed;    GI/Hepatic Neg liver ROS, GERD  Controlled,  Endo/Other  negative endocrine ROS  Renal/GU negative Renal ROS  negative genitourinary   Musculoskeletal   Abdominal   Peds  Hematology negative hematology ROS (+)   Anesthesia Other Findings H/o ETOH abuse, 1998;  inr (10/08/2017)=2.1; last coumadin: 4 days ago; last plavix: 3/3; (surgeon aware of anticoag)     Reproductive/Obstetrics                            Anesthesia Physical Anesthesia Plan  ASA: III  Anesthesia Plan: General   Post-op Pain Management:    Induction:   PONV Risk Score and Plan:   Airway Management Planned: Natural Airway  Additional Equipment:   Intra-op Plan:   Post-operative Plan:   Informed Consent: I have reviewed the patients History and Physical, chart, labs and discussed the procedure including the risks, benefits and alternatives for the proposed anesthesia with the patient or authorized representative who has indicated  his/her understanding and acceptance.     Plan Discussed with: CRNA  Anesthesia Plan Comments:        Anesthesia Quick Evaluation

## 2017-10-22 NOTE — Op Note (Signed)
[facility] Patient Name: Sara [name] Procedure Date: 10/22/2017 9:25 AM MRN: [id number] Account #: [id number] Date of Birth: 02/20/1951 Admit Type: Outpatient Age: 67 Room: [facility] OR ROOM 01 Gender: Female Note Status: Finalized Procedure:            Colonoscopy Indications:          Family history of advanced adenoma of the colon in a                        first-degree relative Providers:            Lucilla Lame MD, MD Medicines:            Propofol per Anesthesia Complications:        No immediate complications. Procedure:            Pre-Anesthesia Assessment:                       - Prior to the procedure, a History and Physical was                        performed, and patient medications and allergies were                        reviewed. The patient's tolerance of previous                        anesthesia was also reviewed. The risks and benefits of                        the procedure and the sedation options and risks were                        discussed with the patient. All questions were                        answered, and informed consent was obtained. Prior                        Anticoagulants: The patient has taken Plavix                        (clopidogrel), last dose was day of procedure. ASA                        Grade Assessment: II - A patient with mild systemic                        disease. After reviewing the risks and benefits, the                        patient was deemed in satisfactory condition to undergo                        the procedure.                       After obtaining informed consent, the colonoscope was                        passed under  direct vision. Throughout the procedure,                        the patient's blood pressure, pulse, and oxygen                        saturations were monitored continuously. The Olympus                        CF-HQ190L Colonoscope (S#. [id number]) was introduced                    through the anus and advanced to the the cecum,                        identified by appendiceal orifice and ileocecal valve.                        The colonoscopy was performed without difficulty. The                        patient tolerated the procedure well. The quality of                        the bowel preparation was excellent. Findings:      The perianal and digital rectal examinations were normal.      Three sessile polyps were found in the ascending colon. The polyps were       3 to 4 mm in size. Polypectomy was not attempted due to the patient       taking anticoagulation medication. Impression:           - Three 3 to 4 mm polyps in the ascending colon.                        Resection not attempted.                       - No specimens collected. Recommendation:       - Discharge patient to home.                       - Resume previous diet.                       - Continue present medications.                       - Repeat colonoscopy off of Pavixx. Procedure Code(s):    --- Professional ---                       [phone], Colonoscopy, flexible; diagnostic, including                        collection of specimen(s) by brushing or washing, when                        performed (separate procedure) Diagnosis Code(s):    --- Professional ---                       Z83.71, Family history of colonic polyps  D12.2, Benign neoplasm of ascending colon CPT copyright 2016 American Medical Association. All rights reserved. The codes documented in this report are preliminary and upon coder review may  be revised to meet current compliance requirements. Lucilla Lame MD, MD 10/22/2017 9:48:39 AM This report has been signed electronically. Number of Addenda: 0 Note Initiated On: 10/22/2017 9:25 AM Scope Withdrawal Time: 0 hours 6 minutes 4 seconds  Total Procedure Duration: 0 hours 11 minutes 4 seconds       [facility]

## 2017-10-22 NOTE — Anesthesia Procedure Notes (Signed)
Performed by: Alaney Witter, CRNA Pre-anesthesia Checklist: Patient identified, Emergency Drugs available, Suction available, Timeout performed and Patient being monitored Patient Re-evaluated:Patient Re-evaluated prior to induction Oxygen Delivery Method: Nasal cannula Placement Confirmation: positive ETCO2       

## 2017-10-22 NOTE — Anesthesia Postprocedure Evaluation (Signed)
Anesthesia Post Note  Patient: Sara [name]  Procedure(s) Performed: COLONOSCOPY WITH PROPOFOL (N/A Rectum)  Patient location during evaluation: PACU Anesthesia Type: General Level of consciousness: awake and alert Pain management: pain level controlled Vital Signs Assessment: post-procedure vital signs reviewed and stable Respiratory status: spontaneous breathing, nonlabored ventilation, respiratory function stable and patient connected to nasal cannula oxygen Cardiovascular status: blood pressure returned to baseline and stable Postop Assessment: no apparent nausea or vomiting Anesthetic complications: no    Manaia Samad

## 2017-10-23 ENCOUNTER — Other Ambulatory Visit (INDEPENDENT_AMBULATORY_CARE_PROVIDER_SITE_OTHER): Payer: Self-pay | Admitting: Vascular Surgery

## 2017-10-24 ENCOUNTER — Other Ambulatory Visit (INDEPENDENT_AMBULATORY_CARE_PROVIDER_SITE_OTHER): Payer: Self-pay

## 2017-10-24 ENCOUNTER — Other Ambulatory Visit: Payer: Self-pay

## 2017-10-24 MED ORDER — WARFARIN SODIUM 2 MG PO TABS: 2.0000 mg | ORAL_TABLET | Freq: Two times a day (BID) | ORAL | 3 refills | Status: DC

## 2017-10-25 ENCOUNTER — Other Ambulatory Visit: Payer: Self-pay

## 2017-10-25 DIAGNOSIS — Z800 Family history of malignant neoplasm of digestive organs: Secondary | ICD-10-CM

## 2017-10-30 ENCOUNTER — Other Ambulatory Visit: Payer: Self-pay

## 2017-10-30 ENCOUNTER — Encounter: Payer: Self-pay | Admitting: *Deleted

## 2017-11-01 ENCOUNTER — Telehealth: Payer: Self-pay | Admitting: Gastroenterology

## 2017-11-01 NOTE — Telephone Encounter (Signed)
Pt left vm to find out abour her supprep for coloscopy 11-05-17

## 2017-11-02 ENCOUNTER — Other Ambulatory Visit: Payer: Self-pay

## 2017-11-02 MED ORDER — PEG 3350-KCL-NABCB-NACL-NASULF 236 G PO SOLR: ORAL | 0 refills | Status: DC

## 2017-11-02 NOTE — Discharge Instructions (Signed)
General Anesthesia, Adult, Care After °These instructions provide you with information about caring for yourself after your procedure. Your health care provider may also give you more specific instructions. Your treatment has been planned according to current medical practices, but problems sometimes occur. Call your health care provider if you have any problems or questions after your procedure. °What can I expect after the procedure? °After the procedure, it is common to have: °· Vomiting. °· A sore throat. °· Mental slowness. ° °It is common to feel: °· Nauseous. °· Cold or shivery. °· Sleepy. °· Tired. °· Sore or achy, even in parts of your body where you did not have surgery. ° °Follow these instructions at home: °For at least 24 hours after the procedure: °· Do not: °? Participate in activities where you could fall or become injured. °? Drive. °? Use heavy machinery. °? Drink alcohol. °? Take sleeping pills or medicines that cause drowsiness. °? Make important decisions or sign legal documents. °? Take care of children on your own. °· Rest. °Eating and drinking °· If you vomit, drink water, juice, or soup when you can drink without vomiting. °· Drink enough fluid to keep your urine clear or pale yellow. °· Make sure you have little or no nausea before eating solid foods. °· Follow the diet recommended by your health care provider. °General instructions °· Have a responsible adult stay with you until you are awake and alert. °· Return to your normal activities as told by your health care provider. Ask your health care provider what activities are safe for you. °· Take over-the-counter and prescription medicines only as told by your health care provider. °· If you smoke, do not smoke without supervision. °· Keep all follow-up visits as told by your health care provider. This is important. °Contact a health care provider if: °· You continue to have nausea or vomiting at home, and medicines are not helpful. °· You  cannot drink fluids or start eating again. °· You cannot urinate after 8-12 hours. °· You develop a skin rash. °· You have fever. °· You have increasing redness at the site of your procedure. °Get help right away if: °· You have difficulty breathing. °· You have chest pain. °· You have unexpected bleeding. °· You feel that you are having a life-threatening or urgent problem. °This information is not intended to replace advice given to you by your health care provider. Make sure you discuss any questions you have with your health care provider. °Document Released: 11/13/2000 Document Revised: 01/10/2016 Document Reviewed: 07/22/2015 °Elsevier Interactive Patient Education © 2018 [employer] Inc[employer] ° °

## 2017-11-02 NOTE — Telephone Encounter (Signed)
Advised pt suprep samples did not come in today. I will call Golytely into her pharmacy.

## 2017-11-05 ENCOUNTER — Ambulatory Visit: Payer: Medicare Other | Admitting: Student in an Organized Health Care Education/Training Program

## 2017-11-05 ENCOUNTER — Ambulatory Visit
Admission: RE | Admit: 2017-11-05 | Discharge: 2017-11-05 | Disposition: A | Discharge status: Home / Self Care | Payer: Medicare Other | Source: Ambulatory Visit | Attending: Gastroenterology | Admitting: Gastroenterology

## 2017-11-05 ENCOUNTER — Encounter
Admission: RE | Disposition: A | Discharge status: Home / Self Care | Payer: Self-pay | Source: Ambulatory Visit | Attending: Gastroenterology

## 2017-11-05 DIAGNOSIS — Z79899 Other long term (current) drug therapy: Secondary | ICD-10-CM | POA: Insufficient documentation

## 2017-11-05 DIAGNOSIS — Z86718 Personal history of other venous thrombosis and embolism: Secondary | ICD-10-CM | POA: Insufficient documentation

## 2017-11-05 DIAGNOSIS — Z8719 Personal history of other diseases of the digestive system: Secondary | ICD-10-CM | POA: Insufficient documentation

## 2017-11-05 DIAGNOSIS — K219 Gastro-esophageal reflux disease without esophagitis: Secondary | ICD-10-CM | POA: Diagnosis not present

## 2017-11-05 DIAGNOSIS — Z860100 Personal history of colon polyps, unspecified: Secondary | ICD-10-CM

## 2017-11-05 DIAGNOSIS — F329 Major depressive disorder, single episode, unspecified: Secondary | ICD-10-CM | POA: Insufficient documentation

## 2017-11-05 DIAGNOSIS — K6389 Other specified diseases of intestine: Secondary | ICD-10-CM | POA: Insufficient documentation

## 2017-11-05 DIAGNOSIS — D122 Benign neoplasm of ascending colon: Secondary | ICD-10-CM | POA: Insufficient documentation

## 2017-11-05 DIAGNOSIS — Z1211 Encounter for screening for malignant neoplasm of colon: Secondary | ICD-10-CM | POA: Insufficient documentation

## 2017-11-05 DIAGNOSIS — Z800 Family history of malignant neoplasm of digestive organs: Secondary | ICD-10-CM

## 2017-11-05 DIAGNOSIS — Z87891 Personal history of nicotine dependence: Secondary | ICD-10-CM | POA: Insufficient documentation

## 2017-11-05 HISTORY — PX: COLONOSCOPY WITH PROPOFOL: SHX5780

## 2017-11-05 HISTORY — PX: POLYPECTOMY: SHX5525

## 2017-11-05 SURGERY — COLONOSCOPY WITH PROPOFOL
Anesthesia: General | Wound class: Contaminated

## 2017-11-05 MED ORDER — STERILE WATER FOR IRRIGATION IR SOLN
Status: DC | PRN
  Administered 2017-11-05: 09:00:00

## 2017-11-05 MED ORDER — PROPOFOL 10 MG/ML IV BOLUS
INTRAVENOUS | Status: DC | PRN
  Administered 2017-11-05: 50 mg via INTRAVENOUS
  Administered 2017-11-05: 100 mg via INTRAVENOUS

## 2017-11-05 MED ORDER — LIDOCAINE HCL (CARDIAC) 20 MG/ML IV SOLN
INTRAVENOUS | Status: DC | PRN
  Administered 2017-11-05: 30 mg via INTRAVENOUS

## 2017-11-05 MED ORDER — LACTATED RINGERS IV SOLN
INTRAVENOUS | Status: DC
  Administered 2017-11-05: 09:00:00 via INTRAVENOUS

## 2017-11-05 MED ORDER — SODIUM CHLORIDE 0.9 % IV SOLN: INTRAVENOUS | Status: DC

## 2017-11-05 SURGICAL SUPPLY — 24 items
CANISTER SUCT 1200ML W/VALVE (MISCELLANEOUS) ×3 IMPLANT
CLIP HMST 235XBRD CATH ROT (MISCELLANEOUS) IMPLANT
CLIP RESOLUTION 360 11X235 (MISCELLANEOUS)
ELECT REM PT RETURN 9FT ADLT (ELECTROSURGICAL)
ELECTRODE REM PT RTRN 9FT ADLT (ELECTROSURGICAL) IMPLANT
FCP ESCP3.2XJMB 240X2.8X (MISCELLANEOUS)
FORCEPS BIOP RAD 4 LRG CAP 4 (CUTTING FORCEPS) ×3 IMPLANT
FORCEPS BIOP RJ4 240 W/NDL (MISCELLANEOUS)
FORCEPS ESCP3.2XJMB 240X2.8X (MISCELLANEOUS) IMPLANT
GOWN CVR UNV OPN BCK APRN NK (MISCELLANEOUS) ×4 IMPLANT
GOWN ISOL THUMB LOOP REG UNIV (MISCELLANEOUS) ×2
INJECTOR VARIJECT VIN23 (MISCELLANEOUS) IMPLANT
KIT DEFENDO VALVE AND CONN (KITS) IMPLANT
KIT ENDO PROCEDURE OLY (KITS) ×3 IMPLANT
MARKER SPOT ENDO TATTOO 5ML (MISCELLANEOUS) IMPLANT
PROBE APC STR FIRE (PROBE) IMPLANT
RETRIEVER NET ROTH 2.5X230 LF (MISCELLANEOUS) IMPLANT
SNARE SHORT THROW 13M SML OVAL (MISCELLANEOUS) IMPLANT
SNARE SHORT THROW 30M LRG OVAL (MISCELLANEOUS) IMPLANT
SNARE SNG USE RND 15MM (INSTRUMENTS) IMPLANT
SPOT EX ENDOSCOPIC TATTOO (MISCELLANEOUS)
TRAP ETRAP POLY (MISCELLANEOUS) IMPLANT
VARIJECT INJECTOR VIN23 (MISCELLANEOUS)
WATER STERILE IRR 250ML POUR (IV SOLUTION) ×3 IMPLANT

## 2017-11-05 NOTE — Anesthesia Procedure Notes (Signed)
Date/Time: 11/05/2017 9:23 AM Performed by: Cameron Ali, CRNA Pre-anesthesia Checklist: Patient identified, Emergency Drugs available, Suction available, Timeout performed and Patient being monitored Patient Re-evaluated:Patient Re-evaluated prior to induction Oxygen Delivery Method: Nasal cannula Placement Confirmation: positive ETCO2

## 2017-11-05 NOTE — Anesthesia Preprocedure Evaluation (Signed)
Anesthesia Evaluation  Patient identified by MRN, date of birth, ID band Patient awake    Reviewed: Allergy & Precautions, H&P , NPO status , Patient's Chart, lab work & pertinent test results, reviewed documented beta blocker date and time   Airway Mallampati: II  TM Distance: >3 FB Neck ROM: full    Dental  (+) Upper Dentures   Pulmonary former smoker,    Pulmonary exam normal breath sounds clear to auscultation       Cardiovascular Exercise Tolerance: Good hypertension, + Peripheral Vascular Disease   Rhythm:regular Rate:Normal     Neuro/Psych Depression Vertigo (last 2015)    GI/Hepatic negative GI ROS, Neg liver ROS, hiatal hernia, PUD, GERD  ,  Endo/Other  negative endocrine ROS  Renal/GU negative Renal ROS  negative genitourinary   Musculoskeletal   Abdominal   Peds  Hematology negative hematology ROS (+)   Anesthesia Other Findings   Reproductive/Obstetrics negative OB ROS                             Anesthesia Physical Anesthesia Plan  ASA: III  Anesthesia Plan: General   Post-op Pain Management:    Induction:   PONV Risk Score and Plan:   Airway Management Planned:   Additional Equipment:   Intra-op Plan:   Post-operative Plan:   Informed Consent: I have reviewed the patients History and Physical, chart, labs and discussed the procedure including the risks, benefits and alternatives for the proposed anesthesia with the patient or authorized representative who has indicated his/her understanding and acceptance.   Dental Advisory Given  Plan Discussed with: CRNA  Anesthesia Plan Comments:         Anesthesia Quick Evaluation

## 2017-11-05 NOTE — Anesthesia Postprocedure Evaluation (Signed)
Anesthesia Post Note  Patient: Sara [name]  Procedure(s) Performed: COLONOSCOPY WITH PROPOFOL (N/A ) POLYPECTOMY  Patient location during evaluation: PACU Anesthesia Type: General Level of consciousness: awake and alert Pain management: pain level controlled Vital Signs Assessment: post-procedure vital signs reviewed and stable Respiratory status: spontaneous breathing, nonlabored ventilation, respiratory function stable and patient connected to nasal cannula oxygen Cardiovascular status: blood pressure returned to baseline and stable Postop Assessment: no apparent nausea or vomiting Anesthetic complications: no    Alisa Graff

## 2017-11-05 NOTE — Op Note (Signed)
[facility] Patient Name: Sara [name] Procedure Date: 11/05/2017 9:17 AM MRN: [id number] Account #: [id number] Date of Birth: 15-Sep-1950 Admit Type: Outpatient Age: 67 Room: [facility] OR ROOM 01 Gender: Female Note Status: Finalized Procedure:            Colonoscopy Indications:          High risk colon cancer surveillance: Personal history                        of colonic polyps Providers:            Lucilla Lame MD, MD Medicines:            Propofol per Anesthesia Complications:        No immediate complications. Procedure:            Pre-Anesthesia Assessment:                       - Prior to the procedure, a History and Physical was                        performed, and patient medications and allergies were                        reviewed. The patient's tolerance of previous                        anesthesia was also reviewed. The risks and benefits of                        the procedure and the sedation options and risks were                        discussed with the patient. All questions were                        answered, and informed consent was obtained. Prior                        Anticoagulants: The patient has taken Plavix                        (clopidogrel), last dose was 5 days prior to procedure.                        ASA Grade Assessment: II - A patient with mild systemic                        disease. After reviewing the risks and benefits, the                        patient was deemed in satisfactory condition to undergo                        the procedure.                       After obtaining informed consent, the colonoscope was                        passed under  direct vision. Throughout the procedure,                        the patient's blood pressure, pulse, and oxygen                        saturations were monitored continuously. The [place] [id number]) was introduced through  the                        anus and advanced to the the cecum, identified by                        appendiceal orifice and ileocecal valve. The                        colonoscopy was performed without difficulty. The                        patient tolerated the procedure well. The quality of                        the bowel preparation was good. Findings:      The perianal and digital rectal examinations were normal.      Three sessile polyps were found in the ascending colon. The polyps were       3 to 4 mm in size. These polyps were removed with a cold biopsy forceps.       Resection and retrieval were complete. Impression:           - Three 3 to 4 mm polyps in the ascending colon,                        removed with a cold biopsy forceps. Resected and                        retrieved. Recommendation:       - Discharge patient to home.                       - Resume previous diet.                       - Continue present medications.                       - Await pathology results.                       - Repeat colonoscopy in 5 years if polyp adenoma and 10                        years if hyperplastic Procedure Code(s):    --- Professional ---                       [id number], Colonoscopy, flexible; with biopsy, single or                        multiple Diagnosis Code(s):    ---  Professional ---                       Z86.010, Personal history of colonic polyps                       D12.2, Benign neoplasm of ascending colon CPT copyright 2016 American Medical Association. All rights reserved. The codes documented in this report are preliminary and upon coder review may  be revised to meet current compliance requirements. Lucilla Lame MD, MD 11/05/2017 9:43:30 AM This report has been signed electronically. Number of Addenda: 0 Note Initiated On: 11/05/2017 9:17 AM Scope Withdrawal Time: 0 hours 10 minutes 9 seconds  Total Procedure Duration: 0 hours 13 minutes 24 seconds       [facility]

## 2017-11-05 NOTE — H&P (Signed)
Lucilla Lame, MD [facility] [address], [place], [place] [address] Phone:[phone] Fax : [phone]  Primary Care Physician:  Danelle Berry, NP Primary Gastroenterologist:  Dr. Allen Norris  Pre-Procedure History & Physical: HPI:  Sara [name] is a 67 y.o. female is here for an colonoscopy.   Past Medical History:  Diagnosis Date  . Depression   . GERD (gastroesophageal reflux disease)   . History of blood clots   . Mild mitral regurgitation   . Osteoporosis   . Stomach ulcer   . Vascular disease    Sees Dr. Delana Meyer  . Vertigo    Last episode approx Aug 2015  . Wears dentures    full upper    Past Surgical History:  Procedure Laterality Date  . BREAST CYST ASPIRATION Left   . CARDIAC CATHETERIZATION  02/15/2017   [facility]  . COLONOSCOPY WITH PROPOFOL N/A 10/22/2017   Procedure: COLONOSCOPY WITH PROPOFOL;  Surgeon: Lucilla Lame, MD;  Location: [facility];  Service: Endoscopy;  Laterality: N/A;  specimens not taken--pt on Plavix will be brought back in after 7 days off med  . ESOPHAGOGASTRODUODENOSCOPY N/A 12/21/2014   Procedure: ESOPHAGOGASTRODUODENOSCOPY (EGD);  Surgeon: Lucilla Lame, MD;  Location: [facility];  Service: Gastroenterology;  Laterality: N/A;  . ESOPHAGOGASTRODUODENOSCOPY (EGD) WITH PROPOFOL N/A 02/08/2016   Procedure: ESOPHAGOGASTRODUODENOSCOPY (EGD) WITH PROPOFOL;  Surgeon: Lucilla Lame, MD;  Location: [facility];  Service: Endoscopy;  Laterality: N/A;  . HEMORROIDECTOMY  2014  . PERIPHERAL VASCULAR CATHETERIZATION N/A 02/09/2016   Procedure: Abdominal Aortogram w/Lower Extremity;  Surgeon: [name], MD;  Location: [facility];  Service: Cardiovascular;  Laterality: N/A;  . PERIPHERAL VASCULAR CATHETERIZATION Right 02/10/2016   Procedure: Lower Extremity Angiography;  Surgeon: [name], MD;  Location: [facility];  Service: Cardiovascular;  Laterality: Right;  . TONSILECTOMY, ADENOIDECTOMY, BILATERAL  MYRINGOTOMY AND TUBES  1968  . VASCULAR SURGERY  [phone]   Fem-Pop Bypass    Prior to Admission medications   Medication Sig Start Date End Date Taking? Authorizing Provider  atorvastatin (LIPITOR) 40 MG tablet  12/08/16  Yes [provider]  clobetasol cream (TEMOVATE) 6.28 % Apply 1 application topically 2 (two) times daily. 12/06/15  Yes Krebs, Amy Lauren, NP  clopidogrel (PLAVIX) 75 MG tablet TAKE 1 TABLET BY MOUTH  DAILY 11/02/16  Yes Schnier, Dolores Lory, MD  ibandronate (BONIVA) 150 MG tablet  12/07/16  Yes [provider]  losartan (COZAAR) 25 MG tablet  12/01/16  Yes [provider]  pantoprazole (PROTONIX) 40 MG tablet TAKE 1 TABLET BY MOUTH TWO  TIMES DAILY 06/07/16  Yes Krebs, Amy Lauren, NP  polyethylene glycol (GOLYTELY) 236 g solution Drink one 8 oz glass every 20 mins until stools are clear starting at 5:00pm on 11/04/17 11/02/17  Yes Eivin Mascio, MD  venlafaxine XR (EFFEXOR-XR) 75 MG 24 hr capsule TAKE 1 CAPSULE BY MOUTH  DAILY WITH BREAKFAST 06/07/16  Yes Krebs, Amy Lauren, NP  warfarin (COUMADIN) 2 MG tablet Take 1 tablet (2 mg total) by mouth 2 (two) times daily. TWICE DAILY Patient taking differently: Take 2 mg by mouth 2 (two) times daily. TWICE DAILY  (Pt taking 4 mg in AM) 10/24/17  Yes Stegmayer, Joelene Millin A, PA-C  atorvastatin (LIPITOR) 10 MG tablet Take 1 tablet by mouth  daily Patient not taking: Reported on 10/16/2017 02/23/16   Luciana Axe, NP  diltiazem (CARDIZEM) 90 MG tablet Take by mouth. 01/24/17   [provider]  isosorbide mononitrate (IMDUR) 30 MG 24 hr tablet Take by mouth. 01/24/17   [provider]  metoprolol tartrate (LOPRESSOR) 25 MG tablet Take 0.5 tablets (12.5 mg total) by mouth 2 (two) times daily. 07/03/16 10/01/16  Wende Bushy, MD  nitroGLYCERIN (NITROSTAT) 0.4 MG SL tablet Place 1 tablet (0.4 mg total) under the tongue every 5 (five) minutes as needed for chest pain. Patient not taking: Reported on  10/16/2017 01/27/16   Wende Bushy, MD  PROLIA 60 MG/ML SOLN injection Inject 60 mg into the skin every 6 (six) months.  04/27/16   [provider]    Allergies as of 10/25/2017 - Review Complete 10/22/2017  Allergen Reaction Noted  . Amoxicillin Other (See Comments) 03/09/2015  . Vicodin [hydrocodone-acetaminophen] Hives and Rash 12/18/2014    Family History  Problem Relation Age of Onset  . CVA Mother   . Heart attack Mother   . Cancer Father        colon cancer  . Colon cancer Father   . Heart disease Maternal Uncle   . Breast cancer Neg Hx     Social History   Socioeconomic History  . Marital status: Single    Spouse name: Not on file  . Number of children: Not on file  . Years of education: Not on file  . Highest education level: Not on file  Social Needs  . Financial resource strain: Not on file  . Food insecurity - worry: Not on file  . Food insecurity - inability: Not on file  . Transportation needs - medical: Not on file  . Transportation needs - non-medical: Not on file  Occupational History  . Not on file  Tobacco Use  . Smoking status: Former Smoker    Packs/day: 2.00    Years: 25.00    Pack years: 50.00    Types: Cigarettes    Last attempt to quit: 08/22/1991    Years since quitting: 26.2  . Smokeless tobacco: Never Used  Substance and Sexual Activity  . Alcohol use: No    Alcohol/week: 0.0 oz  . Drug use: No  . Sexual activity: Not on file  Other Topics Concern  . Not on file  Social History Narrative  . Not on file    Review of Systems: See HPI, otherwise negative ROS  Physical Exam: BP 122/74   Pulse 74   Temp (!) 97.5 F (36.4 C) (Temporal)   Resp 17   Ht 5\' 4"  (1.626 m)   Wt 138 lb (62.6 kg)   SpO2 100%   BMI 23.69 kg/m  General:   Alert,  pleasant and cooperative in NAD Head:  Normocephalic and atraumatic. Neck:  Supple; no masses or thyromegaly. Lungs:  Clear throughout to auscultation.    Heart:  Regular rate and  rhythm. Abdomen:  Soft, nontender and nondistended. Normal bowel sounds, without guarding, and without rebound.   Neurologic:  Alert and  oriented x4;  grossly normal neurologically.  Impression/Plan: Sara [name] is here for an colonoscopy to be performed for history of polyps  Risks, benefits, limitations, and alternatives regarding  colonoscopy have been reviewed with the patient.  Questions have been answered.  All parties agreeable.   Lucilla Lame, MD  11/05/2017, 8:20 AM

## 2017-11-05 NOTE — Transfer of Care (Signed)
Immediate Anesthesia Transfer of Care Note  Patient: Sara [name]  Procedure(s) Performed: COLONOSCOPY WITH PROPOFOL (N/A ) POLYPECTOMY  Patient Location: PACU  Anesthesia Type: General  Level of Consciousness: awake, alert  and patient cooperative  Airway and Oxygen Therapy: Patient Spontanous Breathing and Patient connected to supplemental oxygen  Post-op Assessment: Post-op Vital signs reviewed, Patient's Cardiovascular Status Stable, Respiratory Function Stable, Patent Airway and No signs of Nausea or vomiting  Post-op Vital Signs: Reviewed and stable  Complications: No apparent anesthesia complications

## 2017-11-06 ENCOUNTER — Encounter: Payer: Self-pay | Admitting: Gastroenterology

## 2017-11-08 ENCOUNTER — Telehealth (INDEPENDENT_AMBULATORY_CARE_PROVIDER_SITE_OTHER): Payer: Self-pay

## 2017-11-08 NOTE — Telephone Encounter (Signed)
I called the patient back to let her know that once Maudie Mercury gives [place] the answer to when her next INR check should be, then a nurse would give her a call back. She was okay with that.

## 2017-11-09 NOTE — Telephone Encounter (Signed)
Last INR was:   Ref. Range 10/08/2017 10:33  INR Unknown 2.12   She should go monthly.

## 2017-11-09 NOTE — Telephone Encounter (Signed)
KS advise for the patient to go get her INR check on Monday and patient was informed with information

## 2017-11-12 ENCOUNTER — Other Ambulatory Visit
Admission: RE | Admit: 2017-11-12 | Discharge: 2017-11-12 | Disposition: A | Discharge status: Home / Self Care | Payer: Medicare Other | Source: Ambulatory Visit | Attending: Vascular Surgery | Admitting: Vascular Surgery

## 2017-11-12 ENCOUNTER — Encounter: Payer: Self-pay | Admitting: Gastroenterology

## 2017-11-12 DIAGNOSIS — I739 Peripheral vascular disease, unspecified: Secondary | ICD-10-CM | POA: Diagnosis present

## 2017-11-12 LAB — PROTIME-INR
INR: 1.56
Prothrombin Time: 18.5 seconds — ABNORMAL HIGH (ref 11.4–15.2)

## 2017-11-13 ENCOUNTER — Telehealth (INDEPENDENT_AMBULATORY_CARE_PROVIDER_SITE_OTHER): Payer: Self-pay

## 2017-11-13 ENCOUNTER — Other Ambulatory Visit (INDEPENDENT_AMBULATORY_CARE_PROVIDER_SITE_OTHER): Payer: Self-pay | Admitting: Vascular Surgery

## 2017-11-13 DIAGNOSIS — I739 Peripheral vascular disease, unspecified: Secondary | ICD-10-CM

## 2017-11-13 NOTE — Telephone Encounter (Signed)
Patient called to get her report on her INR/ Her level is reading at a 1.56.  I spoke to Maudie Mercury, she would like the patient to start taking 5 mg coumadin daily, and to recheck her INR levels in a week. Maudie Mercury is putiing the order in today.

## 2017-11-13 NOTE — Progress Notes (Signed)
inr

## 2017-11-19 ENCOUNTER — Other Ambulatory Visit
Admission: RE | Admit: 2017-11-19 | Discharge: 2017-11-19 | Disposition: A | Discharge status: Home / Self Care | Payer: Medicare Other | Source: Ambulatory Visit | Attending: Vascular Surgery | Admitting: Vascular Surgery

## 2017-11-19 DIAGNOSIS — I739 Peripheral vascular disease, unspecified: Secondary | ICD-10-CM

## 2017-11-19 LAB — PROTIME-INR
INR: 2.29
Prothrombin Time: 25 seconds — ABNORMAL HIGH (ref 11.4–15.2)

## 2017-11-19 NOTE — Addendum Note (Signed)
Addended by: Santiago Bur on: 11/19/2017 10:27 AM   Modules accepted: Orders

## 2017-11-20 ENCOUNTER — Telehealth (INDEPENDENT_AMBULATORY_CARE_PROVIDER_SITE_OTHER): Payer: Self-pay

## 2017-11-20 NOTE — Telephone Encounter (Signed)
Patient called regarding inr results and ask if should continue taking 5mg  warfarin.I spoke with JD and he advise her to recheck in 1 month and continue taking 5mg 

## 2017-12-14 ENCOUNTER — Other Ambulatory Visit
Admission: RE | Admit: 2017-12-14 | Discharge: 2017-12-14 | Disposition: A | Discharge status: Home / Self Care | Payer: Medicare Other | Source: Ambulatory Visit | Attending: Vascular Surgery | Admitting: Vascular Surgery

## 2017-12-14 DIAGNOSIS — I739 Peripheral vascular disease, unspecified: Secondary | ICD-10-CM | POA: Diagnosis present

## 2017-12-14 LAB — PROTIME-INR
INR: 2.49
Prothrombin Time: 26.7 seconds — ABNORMAL HIGH (ref 11.4–15.2)

## 2017-12-17 ENCOUNTER — Other Ambulatory Visit (INDEPENDENT_AMBULATORY_CARE_PROVIDER_SITE_OTHER): Payer: Self-pay | Admitting: Vascular Surgery

## 2017-12-17 ENCOUNTER — Telehealth (INDEPENDENT_AMBULATORY_CARE_PROVIDER_SITE_OTHER): Payer: Self-pay

## 2017-12-17 DIAGNOSIS — I739 Peripheral vascular disease, unspecified: Secondary | ICD-10-CM

## 2017-12-17 NOTE — Telephone Encounter (Signed)
Patient had inr check on 4/26 and it was 2.49.I spoke with KS and she advise for the patient to continue taking the same dosage of medicine and recheck inr in 3-4week

## 2018-01-01 ENCOUNTER — Encounter (INDEPENDENT_AMBULATORY_CARE_PROVIDER_SITE_OTHER): Payer: Self-pay | Admitting: Vascular Surgery

## 2018-01-01 ENCOUNTER — Ambulatory Visit (INDEPENDENT_AMBULATORY_CARE_PROVIDER_SITE_OTHER): Payer: Medicare Other

## 2018-01-01 ENCOUNTER — Ambulatory Visit (INDEPENDENT_AMBULATORY_CARE_PROVIDER_SITE_OTHER): Payer: Medicare Other | Admitting: Vascular Surgery

## 2018-01-01 VITALS — BP 124/72 | HR 78 | Resp 13 | Ht 63.0 in | Wt 136.0 lb

## 2018-01-01 DIAGNOSIS — Z7901 Long term (current) use of anticoagulants: Secondary | ICD-10-CM

## 2018-01-01 DIAGNOSIS — I779 Disorder of arteries and arterioles, unspecified: Secondary | ICD-10-CM

## 2018-01-01 DIAGNOSIS — I739 Peripheral vascular disease, unspecified: Secondary | ICD-10-CM

## 2018-01-01 DIAGNOSIS — E7849 Other hyperlipidemia: Secondary | ICD-10-CM

## 2018-01-01 NOTE — Progress Notes (Signed)
Subjective:    Patient ID: Sara [name], female    DOB: 05-Mar-1951, 67 y.o.   MRN: [id number] Chief Complaint  Patient presents with  . Follow-up    6 month U/S follow up   Patient presents for a six-month peripheral artery disease follow-up.  The patient presents without complaint with the exception of some right calf cramping.  This happens on intermittent basis with activity.  The patient notes that she continues to wear her medical grade 1 compression socks and elevates her legs on a daily basis.  The patient continues to take her Coumadin as directed.  We follow her INR levels on a regular basis.  She is currently therapeutic.  The patient denies any rest pain or ulceration to the bilateral lower extremity.  The patient underwent a bilateral ABI which was notable for right: 1.11 and left: 1.08.  Triphasic tibial arteries with normal great toe waveforms bilaterally.  No evidence of significant right lower or left lower extremity disease.  Right lower extremity arterial duplex with triphasic blood flow through the patient's graft.  When compared to the previous study approximately 6 months ago there has been no change.  Patient denies any fever, nausea or vomiting.  Review of Systems  Constitutional: Negative.   HENT: Negative.   Eyes: Negative.   Respiratory: Negative.   Cardiovascular: Negative.   Gastrointestinal: Negative.   Endocrine: Negative.   Genitourinary: Negative.   Musculoskeletal: Negative.   Skin: Negative.   Allergic/Immunologic: Negative.   Neurological: Negative.   Hematological: Negative.   Psychiatric/Behavioral: Negative.       Objective:   Physical Exam  Constitutional: She is oriented to person, place, and time. She appears well-developed and well-nourished. No distress.  HENT:  Head: Normocephalic and atraumatic.  Right Ear: External ear normal.  Left Ear: External ear normal.  Eyes: Pupils are equal, round, and reactive to light. Conjunctivae and EOM  are normal.  Neck: Normal range of motion.  Cardiovascular: Normal rate, regular rhythm, normal heart sounds and intact distal pulses.  Pulses:      Radial pulses are 2+ on the right side, and 2+ on the left side.       Dorsalis pedis pulses are 2+ on the right side, and 2+ on the left side.       Posterior tibial pulses are 2+ on the right side, and 2+ on the left side.  Pulmonary/Chest: Effort normal and breath sounds normal.  Musculoskeletal: Normal range of motion. She exhibits no edema.  Neurological: She is alert and oriented to person, place, and time.  Skin: Skin is warm and dry. She is not diaphoretic.  Psychiatric: She has a normal mood and affect. Her behavior is normal. Judgment and thought content normal.  Vitals reviewed.  BP 124/72 (BP Location: Right Arm, Patient Position: Sitting)   Pulse 78   Resp 13   Ht 5\' 3"  (1.6 m)   Wt 136 lb (61.7 kg)   BMI 24.09 kg/m   Past Medical History:  Diagnosis Date  . Depression   . GERD (gastroesophageal reflux disease)   . History of blood clots   . Mild mitral regurgitation   . Osteoporosis   . Stomach ulcer   . Vascular disease    Sees Dr. Delana Meyer  . Vertigo    Last episode approx Aug 2015  . Wears dentures    full upper   Social History   Socioeconomic History  . Marital status: Single    Spouse  name: Not on file  . Number of children: Not on file  . Years of education: Not on file  . Highest education level: Not on file  Occupational History  . Not on file  Social Needs  . Financial resource strain: Not on file  . Food insecurity:    Worry: Not on file    Inability: Not on file  . Transportation needs:    Medical: Not on file    Non-medical: Not on file  Tobacco Use  . Smoking status: Former Smoker    Packs/day: 2.00    Years: 25.00    Pack years: 50.00    Types: Cigarettes    Last attempt to quit: 08/22/1991    Years since quitting: 26.3  . Smokeless tobacco: Never Used  Substance and Sexual  Activity  . Alcohol use: No    Alcohol/week: 0.0 oz  . Drug use: No  . Sexual activity: Not on file  Lifestyle  . Physical activity:    Days per week: Not on file    Minutes per session: Not on file  . Stress: Not on file  Relationships  . Social connections:    Talks on phone: Not on file    Gets together: Not on file    Attends religious service: Not on file    Active member of club or organization: Not on file    Attends meetings of clubs or organizations: Not on file    Relationship status: Not on file  . Intimate partner violence:    Fear of current or ex partner: Not on file    Emotionally abused: Not on file    Physically abused: Not on file    Forced sexual activity: Not on file  Other Topics Concern  . Not on file  Social History Narrative  . Not on file   Past Surgical History:  Procedure Laterality Date  . BREAST CYST ASPIRATION Left   . CARDIAC CATHETERIZATION  02/15/2017   [facility]  . COLONOSCOPY WITH PROPOFOL N/A 10/22/2017   Procedure: COLONOSCOPY WITH PROPOFOL;  Surgeon: Lucilla Lame, MD;  Location: [place];  Service: Endoscopy;  Laterality: N/A;  specimens not taken--pt on Plavix will be brought back in after 7 days off med  . COLONOSCOPY WITH PROPOFOL N/A 11/05/2017   Procedure: COLONOSCOPY WITH PROPOFOL;  Surgeon: Lucilla Lame, MD;  Location: [place];  Service: Endoscopy;  Laterality: N/A;  . ESOPHAGOGASTRODUODENOSCOPY N/A 12/21/2014   Procedure: ESOPHAGOGASTRODUODENOSCOPY (EGD);  Surgeon: Lucilla Lame, MD;  Location: [place];  Service: Gastroenterology;  Laterality: N/A;  . ESOPHAGOGASTRODUODENOSCOPY (EGD) WITH PROPOFOL N/A 02/08/2016   Procedure: ESOPHAGOGASTRODUODENOSCOPY (EGD) WITH PROPOFOL;  Surgeon: Lucilla Lame, MD;  Location: [facility];  Service: Endoscopy;  Laterality: N/A;  . HEMORROIDECTOMY  2014  . PERIPHERAL VASCULAR CATHETERIZATION N/A 02/09/2016   Procedure: Abdominal Aortogram w/Lower Extremity;  Surgeon: [name], MD;  Location: [place] [facility];  Service: Cardiovascular;  Laterality: N/A;  . PERIPHERAL VASCULAR CATHETERIZATION Right 02/10/2016   Procedure: Lower Extremity Angiography;  Surgeon: [name], MD;  Location: [place] [facility];  Service: Cardiovascular;  Laterality: Right;  . POLYPECTOMY  11/05/2017   Procedure: POLYPECTOMY;  Surgeon: Lucilla Lame, MD;  Location: [place];  Service: Endoscopy;;  . TONSILECTOMY, ADENOIDECTOMY, BILATERAL MYRINGOTOMY AND TUBES  1968  . VASCULAR SURGERY  1950,9326   Fem-Pop Bypass   Family History  Problem Relation Age of Onset  . CVA Mother   .  Heart attack Mother   . Cancer Father        colon cancer  . Colon cancer Father   . Heart disease Maternal Uncle   . Breast cancer Neg Hx    Allergies  Allergen Reactions  . Amoxicillin Other (See Comments)    Yeast infection  . Vicodin [Hydrocodone-Acetaminophen] Hives and Rash      Assessment & Plan:  Patient presents for a six-month peripheral artery disease follow-up.  The patient presents without complaint with the exception of some right calf cramping.  This happens on intermittent basis with activity.  The patient notes that she continues to wear her medical grade 1 compression socks and elevates her legs on a daily basis.  The patient continues to take her Coumadin as directed.  We follow her INR levels on a regular basis.  She is currently therapeutic.  The patient denies any rest pain or ulceration to the bilateral lower extremity.  The patient underwent a bilateral ABI which was notable for right: 1.11 and left: 1.08.  Triphasic tibial arteries with normal great toe waveforms bilaterally.  No evidence of significant right lower or left lower extremity disease.  Right lower extremity arterial duplex with triphasic blood flow through the patient's graft.  When compared to the previous study approximately 6 months ago there has been no change.  Patient denies any fever,  nausea or vomiting.  1. PVD (peripheral vascular disease) ([place]) - Stable Patient with triphasic blood flow to the bilateral lower extremity and through the patient's right lower extremity graft. Unsure as to why the patient is experiencing intermittent cramping to the right calf as her studies and physical exam is unremarkable The patient has never had a formal venous work-up perhaps she has venous insufficiency to the right lower extremity. We will order a bilateral lower extremity venous duplex to rule out any contributing reflux In the meantime the patient is to continue engaging conservative therapy which she has doing for over a year including wearing medical grade 1 compression socks and elevating her legs and remaining active. The patient should continue to follow-up every 6 months to surveilled her peripheral artery disease The patient's next INR has been ordered I have discussed with the patient at length the risk factors for and pathogenesis of atherosclerotic disease and encouraged a healthy diet, regular exercise regimen and blood pressure / glucose control.  The patient was encouraged to call the office in the interim if he experiences any claudication like symptoms, rest pain or ulcers to his feet / toes  - INR/PT; Future - VAS [place] LOWER EXTREMITY VENOUS REFLUX; Future - VAS [place] ABI WITH/WO TBI; Future - VAS [place] LOWER EXTREMITY ARTERIAL DUPLEX; Future  2. Other hyperlipidemia - Stable Encouraged good control as its slows the progression of atherosclerotic disease  3. Anticoagulated on warfarin - Stable Therapeutic range is 2.0-3.0 This is followed every 3 weeks Last INR was 2.49 Patient should continue checking her INR every 3 to 4 weeks.  Current Outpatient Medications on File Prior to Visit  Medication Sig Dispense Refill  . atorvastatin (LIPITOR) 40 MG tablet     . clobetasol cream (TEMOVATE) 9.62 % Apply 1 application topically 2 (two) times daily. 30 g 0  . clopidogrel  (PLAVIX) 75 MG tablet TAKE 1 TABLET BY MOUTH  DAILY 90 tablet 11  . ibandronate (BONIVA) 150 MG tablet     . losartan (COZAAR) 25 MG tablet     . nitroGLYCERIN (NITROSTAT) 0.4 MG SL  tablet Place 1 tablet (0.4 mg total) under the tongue every 5 (five) minutes as needed for chest pain. 25 tablet 6  . pantoprazole (PROTONIX) 40 MG tablet TAKE 1 TABLET BY MOUTH TWO  TIMES DAILY 180 tablet 1  . PROLIA 60 MG/ML SOLN injection Inject 60 mg into the skin every 6 (six) months.     . venlafaxine XR (EFFEXOR-XR) 75 MG 24 hr capsule TAKE 1 CAPSULE BY MOUTH  DAILY WITH BREAKFAST 90 capsule 3  . warfarin (COUMADIN) 2 MG tablet Take 1 tablet (2 mg total) by mouth 2 (two) times daily. TWICE DAILY (Patient taking differently: Take 2 mg by mouth 2 (two) times daily. TWICE DAILY  (Pt taking 4 mg in AM)) 60 tablet 3  . atorvastatin (LIPITOR) 10 MG tablet Take 1 tablet by mouth  daily (Patient not taking: Reported on 10/16/2017) 90 tablet 3  . buPROPion (WELLBUTRIN XL) 150 MG 24 hr tablet Take by mouth.    . diltiazem (CARDIZEM) 90 MG tablet Take by mouth.    . isosorbide mononitrate (IMDUR) 30 MG 24 hr tablet Take by mouth.    . metoprolol tartrate (LOPRESSOR) 25 MG tablet Take 0.5 tablets (12.5 mg total) by mouth 2 (two) times daily. 90 tablet 3  . polyethylene glycol (GOLYTELY) 236 g solution Drink one 8 oz glass every 20 mins until stools are clear starting at 5:00pm on 11/04/17 (Patient not taking: Reported on 01/01/2018) 4000 mL 0   No current facility-administered medications on file prior to visit.    There are no Patient Instructions on file for this visit. No follow-ups on file.  Cade Olberding A Feven Alderfer, PA-C

## 2018-01-08 ENCOUNTER — Other Ambulatory Visit
Admission: RE | Admit: 2018-01-08 | Discharge: 2018-01-08 | Disposition: A | Discharge status: Home / Self Care | Payer: Medicare Other | Source: Ambulatory Visit | Attending: Vascular Surgery | Admitting: Vascular Surgery

## 2018-01-08 ENCOUNTER — Telehealth (INDEPENDENT_AMBULATORY_CARE_PROVIDER_SITE_OTHER): Payer: Self-pay

## 2018-01-08 ENCOUNTER — Other Ambulatory Visit (INDEPENDENT_AMBULATORY_CARE_PROVIDER_SITE_OTHER): Payer: Self-pay | Admitting: Vascular Surgery

## 2018-01-08 DIAGNOSIS — I739 Peripheral vascular disease, unspecified: Secondary | ICD-10-CM | POA: Insufficient documentation

## 2018-01-08 LAB — PROTIME-INR
INR: 4.28 — AB
PROTHROMBIN TIME: 40.8 s — AB (ref 11.4–15.2)

## 2018-01-08 NOTE — Telephone Encounter (Signed)
I attempted to contact the patient , I left a message for a return call regarding her Coumadin.

## 2018-01-08 NOTE — Telephone Encounter (Signed)
Critical value INR on patient 4:2.

## 2018-01-08 NOTE — Telephone Encounter (Signed)
Please stop your Coumadin for 3 days.  Please restart your Coumadin (4mg  daily) on day #4.  Please have your INR drawn in one week.  I have placed an order.

## 2018-01-09 NOTE — Telephone Encounter (Signed)
Spoke with the patient and gave her the recommendations from [employer] PA.

## 2018-01-17 ENCOUNTER — Other Ambulatory Visit (INDEPENDENT_AMBULATORY_CARE_PROVIDER_SITE_OTHER): Payer: Self-pay | Admitting: Vascular Surgery

## 2018-01-17 ENCOUNTER — Other Ambulatory Visit
Admission: RE | Admit: 2018-01-17 | Discharge: 2018-01-17 | Disposition: A | Discharge status: Home / Self Care | Payer: Medicare Other | Source: Ambulatory Visit | Attending: Vascular Surgery | Admitting: Vascular Surgery

## 2018-01-17 DIAGNOSIS — I739 Peripheral vascular disease, unspecified: Secondary | ICD-10-CM | POA: Insufficient documentation

## 2018-01-17 LAB — PROTIME-INR
INR: 2.15
PROTHROMBIN TIME: 23.8 s — AB (ref 11.4–15.2)

## 2018-01-23 ENCOUNTER — Other Ambulatory Visit (INDEPENDENT_AMBULATORY_CARE_PROVIDER_SITE_OTHER): Payer: Self-pay

## 2018-01-23 DIAGNOSIS — I739 Peripheral vascular disease, unspecified: Secondary | ICD-10-CM

## 2018-01-24 ENCOUNTER — Other Ambulatory Visit (INDEPENDENT_AMBULATORY_CARE_PROVIDER_SITE_OTHER): Payer: Self-pay | Admitting: Vascular Surgery

## 2018-01-24 ENCOUNTER — Other Ambulatory Visit (INDEPENDENT_AMBULATORY_CARE_PROVIDER_SITE_OTHER): Payer: Self-pay

## 2018-01-24 ENCOUNTER — Other Ambulatory Visit
Admission: RE | Admit: 2018-01-24 | Discharge: 2018-01-24 | Disposition: A | Discharge status: Home / Self Care | Payer: Medicare Other | Source: Ambulatory Visit | Attending: Vascular Surgery | Admitting: Vascular Surgery

## 2018-01-24 DIAGNOSIS — I739 Peripheral vascular disease, unspecified: Secondary | ICD-10-CM | POA: Insufficient documentation

## 2018-01-24 LAB — PROTIME-INR
INR: 2.21
Prothrombin Time: 24.3 seconds — ABNORMAL HIGH (ref 11.4–15.2)

## 2018-01-28 ENCOUNTER — Ambulatory Visit: Payer: Medicare Other

## 2018-01-30 ENCOUNTER — Encounter (INDEPENDENT_AMBULATORY_CARE_PROVIDER_SITE_OTHER): Payer: Medicare Other

## 2018-01-30 ENCOUNTER — Ambulatory Visit (INDEPENDENT_AMBULATORY_CARE_PROVIDER_SITE_OTHER): Payer: Medicare Other | Admitting: Vascular Surgery

## 2018-02-02 ENCOUNTER — Other Ambulatory Visit (INDEPENDENT_AMBULATORY_CARE_PROVIDER_SITE_OTHER): Payer: Self-pay | Admitting: Vascular Surgery

## 2018-02-14 ENCOUNTER — Other Ambulatory Visit (INDEPENDENT_AMBULATORY_CARE_PROVIDER_SITE_OTHER): Payer: Self-pay | Admitting: Vascular Surgery

## 2018-02-14 ENCOUNTER — Other Ambulatory Visit
Admission: RE | Admit: 2018-02-14 | Discharge: 2018-02-14 | Disposition: A | Discharge status: Home / Self Care | Payer: Medicare Other | Source: Ambulatory Visit | Attending: Vascular Surgery | Admitting: Vascular Surgery

## 2018-02-14 DIAGNOSIS — I739 Peripheral vascular disease, unspecified: Secondary | ICD-10-CM | POA: Insufficient documentation

## 2018-02-14 LAB — PROTIME-INR
INR: 2.08
PROTHROMBIN TIME: 23.2 s — AB (ref 11.4–15.2)

## 2018-02-25 ENCOUNTER — Encounter (INDEPENDENT_AMBULATORY_CARE_PROVIDER_SITE_OTHER): Payer: Self-pay | Admitting: Vascular Surgery

## 2018-02-25 ENCOUNTER — Ambulatory Visit (INDEPENDENT_AMBULATORY_CARE_PROVIDER_SITE_OTHER): Payer: Medicare Other | Admitting: Vascular Surgery

## 2018-02-25 VITALS — BP 113/67 | HR 96 | Resp 14 | Ht 65.0 in | Wt 138.0 lb

## 2018-02-25 DIAGNOSIS — K219 Gastro-esophageal reflux disease without esophagitis: Secondary | ICD-10-CM | POA: Diagnosis not present

## 2018-02-25 DIAGNOSIS — I10 Essential (primary) hypertension: Secondary | ICD-10-CM | POA: Diagnosis not present

## 2018-02-25 DIAGNOSIS — I6523 Occlusion and stenosis of bilateral carotid arteries: Secondary | ICD-10-CM

## 2018-02-25 DIAGNOSIS — I872 Venous insufficiency (chronic) (peripheral): Secondary | ICD-10-CM | POA: Diagnosis not present

## 2018-02-25 DIAGNOSIS — I779 Disorder of arteries and arterioles, unspecified: Secondary | ICD-10-CM

## 2018-02-25 NOTE — Progress Notes (Signed)
MRN : [id number]  Sara [name] is a 67 y.o. (09/10/1950) female who presents with chief complaint of  Chief Complaint  Patient presents with  . Follow-up    Right leg pain with a knot  .  History of Present Illness: The patient returns to the office for followup evaluation regarding right leg swelling.  The swelling has persisted and the pain associated with swelling continues. There have not been any interval development of a ulcerations or wounds.  Since the previous visit the patient has been wearing graduated compression stockings and has noted little if any improvement in the lymphedema. The patient has been using compression routinely morning until night.  The patient also states elevation during the day and exercise is being done too.   No outpatient medications have been marked as taking for the 02/25/18 encounter (Office Visit) with Delana Meyer, Dolores Lory, MD.    Past Medical History:  Diagnosis Date  . Depression   . GERD (gastroesophageal reflux disease)   . History of blood clots   . Mild mitral regurgitation   . Osteoporosis   . Stomach ulcer   . Vascular disease    Sees Dr. Delana Meyer  . Vertigo    Last episode approx Aug 2015  . Wears dentures    full upper    Past Surgical History:  Procedure Laterality Date  . BREAST CYST ASPIRATION Left   . CARDIAC CATHETERIZATION  02/15/2017   [facility]  . COLONOSCOPY WITH PROPOFOL N/A 10/22/2017   Procedure: COLONOSCOPY WITH PROPOFOL;  Surgeon: Lucilla Lame, MD;  Location: [place];  Service: Endoscopy;  Laterality: N/A;  specimens not taken--pt on Plavix will be brought back in after 7 days off med  . COLONOSCOPY WITH PROPOFOL N/A 11/05/2017   Procedure: COLONOSCOPY WITH PROPOFOL;  Surgeon: Lucilla Lame, MD;  Location: [place];  Service: Endoscopy;  Laterality: N/A;  . ESOPHAGOGASTRODUODENOSCOPY N/A 12/21/2014   Procedure: ESOPHAGOGASTRODUODENOSCOPY (EGD);  Surgeon: Lucilla Lame, MD;  Location: [place];  Service: Gastroenterology;  Laterality: N/A;  . ESOPHAGOGASTRODUODENOSCOPY (EGD) WITH PROPOFOL N/A 02/08/2016   Procedure: ESOPHAGOGASTRODUODENOSCOPY (EGD) WITH PROPOFOL;  Surgeon: Lucilla Lame, MD;  Location: [facility];  Service: Endoscopy;  Laterality: N/A;  . HEMORROIDECTOMY  2014  . PERIPHERAL VASCULAR CATHETERIZATION N/A 02/09/2016   Procedure: Abdominal Aortogram w/Lower Extremity;  Surgeon: [name], MD;  Location: [place] [facility];  Service: Cardiovascular;  Laterality: N/A;  . PERIPHERAL VASCULAR CATHETERIZATION Right 02/10/2016   Procedure: Lower Extremity Angiography;  Surgeon: [name], MD;  Location: [place] [facility];  Service: Cardiovascular;  Laterality: Right;  . POLYPECTOMY  11/05/2017   Procedure: POLYPECTOMY;  Surgeon: Lucilla Lame, MD;  Location: [place];  Service: Endoscopy;;  . TONSILECTOMY, ADENOIDECTOMY, BILATERAL MYRINGOTOMY AND TUBES  1968  . VASCULAR SURGERY  [id number]   Fem-Pop Bypass    Social History Social History   Tobacco Use  . Smoking status: Former Smoker    Packs/day: 2.00    Years: 25.00    Pack years: 50.00    Types: Cigarettes    Last attempt to quit: 08/22/1991    Years since quitting: 26.5  . Smokeless tobacco: Never Used  Substance Use Topics  . Alcohol use: No    Alcohol/week: 0.0 oz  . Drug use: No    Family History Family History  Problem Relation Age of Onset  . CVA Mother   . Heart attack Mother   . Cancer Father  colon cancer  . Colon cancer Father   . Heart disease Maternal Uncle   . Breast cancer Neg Hx     Allergies  Allergen Reactions  . Amoxicillin Other (See Comments)    Yeast infection  . Vicodin [Hydrocodone-Acetaminophen] Hives and Rash     REVIEW OF SYSTEMS (Negative unless checked)  Constitutional: [] Weight loss  [] Fever  [] Chills Cardiac: [] Chest pain   [] Chest pressure   [] Palpitations   [] Shortness of breath when laying flat   [] Shortness of breath  with exertion. Vascular:  [] Pain in legs with walking   [x] Pain in legs at rest  [] History of DVT   [] Phlebitis   [x] Swelling in legs   [] Varicose veins   [] Non-healing ulcers Pulmonary:   [] Uses home oxygen   [] Productive cough   [] Hemoptysis   [] Wheeze  [] COPD   [] Asthma Neurologic:  [] Dizziness   [] Seizures   [] History of stroke   [] History of TIA  [] Aphasia   [] Vissual changes   [] Weakness or numbness in arm   [] Weakness or numbness in leg Musculoskeletal:   [] Joint swelling   [] Joint pain   [] Low back pain Hematologic:  [] Easy bruising  [] Easy bleeding   [] Hypercoagulable state   [] Anemic Gastrointestinal:  [] Diarrhea   [] Vomiting  [] Gastroesophageal reflux/heartburn   [] Difficulty swallowing. Genitourinary:  [] Chronic kidney disease   [] Difficult urination  [] Frequent urination   [] Blood in urine Skin:  [] Rashes   [] Ulcers  Psychological:  [] History of anxiety   []  History of major depression.  Physical Examination  Vitals:   02/25/18 1311  BP: 113/67  Pulse: 96  Resp: 14  Weight: 138 lb (62.6 kg)  Height: 5\' 5"  (1.651 m)   Body mass index is 22.96 kg/m. Gen: WD/WN, NAD Head: [place]/AT, No temporalis wasting.  Ear/Nose/Throat: Hearing grossly intact, nares w/o erythema or drainage Eyes: PER, EOMI, sclera nonicteric.  Neck: Supple, no large masses.   Pulmonary:  Good air movement, no audible wheezing bilaterally, no use of accessory muscles.  Cardiac: RRR, no JVD Vascular: scattered varicosities present bilaterally.  Right moderate venous stasis changes to the legs bilaterally.  Right 3++ soft pitting edema Vessel Right Left  Radial Palpable Palpable  PT Trace Palpable Trace Palpable  DP Not Palpable Not Palpable  Gastrointestinal: Non-distended. No guarding/no peritoneal signs.  Musculoskeletal: M/S 5/5 throughout.  No deformity or atrophy.  Neurologic: CN 2-12 intact. Symmetrical.  Speech is fluent. Motor exam as listed above. Psychiatric: Judgment intact, Mood & affect  appropriate for pt's clinical situation. Dermatologic: No rashes or ulcers noted.  No changes consistent with cellulitis. Lymph : No lichenification or skin changes of chronic lymphedema.  CBC Lab Results  Component Value Date   WBC 16.2 (H) 02/14/2016   HGB 12.7 02/14/2016   HCT 37.5 02/14/2016   MCV 95.2 02/14/2016   PLT 116 (L) 02/14/2016    BMET    Component Value Date/Time   NA 138 05/16/2016 1034   NA 139 09/21/2015 0916   K 4.5 05/16/2016 1034   CL 106 05/16/2016 1034   CO2 21 05/16/2016 1034   GLUCOSE 103 (H) 05/16/2016 1034   BUN 12 05/16/2016 1034   BUN 12 09/21/2015 0916   CREATININE 0.88 05/16/2016 1034   CALCIUM 8.8 05/16/2016 1034   GFRNONAA 66 05/01/2016 0846   GFRAA 76 05/01/2016 0846   CrCl cannot be calculated (Patient's most recent lab result is older than the maximum 21 days allowed.).  COAG Lab Results  Component Value Date  INR 2.08 02/14/2018   INR 2.21 01/24/2018   INR 2.15 01/17/2018    Radiology No results found.    Assessment/Plan 1. Chronic venous insufficiency No surgery or intervention at this point in time.    I have had a long discussion with the patient regarding venous insufficiency and why it  causes symptoms. I have discussed with the patient the chronic skin changes that accompany venous insufficiency and the long term sequela such as infection and ulceration.  Patient will begin wearing graduated compression stockings class 1 (20-30 mmHg) or compression wraps on a daily basis a prescription was given. The patient will put the stockings on first thing in the morning and removing them in the evening. The patient is instructed specifically not to sleep in the stockings.    In addition, behavioral modification including several periods of elevation of the lower extremities during the day will be continued. I have demonstrated that proper elevation is a position with the ankles at heart level.  The patient is instructed to begin  routine exercise, especially walking on a daily basis  Patient should undergo duplex ultrasound of the venous system to ensure that DVT or reflux is not present.  - VAS [place] LOWER EXTREMITY VENOUS REFLUX; Future  2. Peripheral arterial occlusive disease (HCC)  Recommend:  The patient has evidence of atherosclerosis of the lower extremities with claudication.  The patient does not voice lifestyle limiting changes at this point in time.  Noninvasive studies do not suggest clinically significant change.  No invasive studies, angiography or surgery at this time The patient should continue walking and begin a more formal exercise program.  The patient should continue antiplatelet therapy and aggressive treatment of the lipid abnormalities  No changes in the patient's medications at this time  The patient should continue wearing graduated compression socks 10-15 mmHg strength to control the mild edema.    3. Essential hypertension Continue antihypertensive medications as already ordered, these medications have been reviewed and there are no changes at this time.   4. Bilateral carotid artery stenosis Recommend:  Given the patient's asymptomatic subcritical stenosis no further invasive testing or surgery at this time.   Continue antiplatelet therapy as prescribed Continue management of CAD, HTN and Hyperlipidemia Healthy heart diet,  encouraged exercise at least 4 times per week Follow up in 6 months with duplex ultrasound and physical exam   5. Gastroesophageal reflux disease without esophagitis Continue antihypertensive medications as already ordered, these medications have been reviewed and there are no changes at this time.  Avoidence of caffeine and alcohol  Moderate elevation of the head of the bed      [name], MD  02/25/2018 1:30 PM

## 2018-03-02 ENCOUNTER — Encounter (INDEPENDENT_AMBULATORY_CARE_PROVIDER_SITE_OTHER): Payer: Self-pay | Admitting: Vascular Surgery

## 2018-03-02 DIAGNOSIS — I872 Venous insufficiency (chronic) (peripheral): Secondary | ICD-10-CM | POA: Insufficient documentation

## 2018-03-04 ENCOUNTER — Encounter (INDEPENDENT_AMBULATORY_CARE_PROVIDER_SITE_OTHER): Payer: Self-pay | Admitting: Vascular Surgery

## 2018-03-04 ENCOUNTER — Ambulatory Visit (INDEPENDENT_AMBULATORY_CARE_PROVIDER_SITE_OTHER): Payer: Medicare Other

## 2018-03-04 ENCOUNTER — Ambulatory Visit (INDEPENDENT_AMBULATORY_CARE_PROVIDER_SITE_OTHER): Payer: Medicare Other | Admitting: Vascular Surgery

## 2018-03-04 VITALS — BP 128/82 | HR 71 | Resp 17 | Ht 65.0 in | Wt 138.0 lb

## 2018-03-04 DIAGNOSIS — K219 Gastro-esophageal reflux disease without esophagitis: Secondary | ICD-10-CM | POA: Diagnosis not present

## 2018-03-04 DIAGNOSIS — E7849 Other hyperlipidemia: Secondary | ICD-10-CM | POA: Diagnosis not present

## 2018-03-04 DIAGNOSIS — I25119 Atherosclerotic heart disease of native coronary artery with unspecified angina pectoris: Secondary | ICD-10-CM | POA: Diagnosis not present

## 2018-03-04 DIAGNOSIS — I739 Peripheral vascular disease, unspecified: Secondary | ICD-10-CM

## 2018-03-04 DIAGNOSIS — I872 Venous insufficiency (chronic) (peripheral): Secondary | ICD-10-CM

## 2018-03-04 DIAGNOSIS — L309 Dermatitis, unspecified: Secondary | ICD-10-CM

## 2018-03-04 DIAGNOSIS — I6521 Occlusion and stenosis of right carotid artery: Secondary | ICD-10-CM | POA: Diagnosis not present

## 2018-03-04 DIAGNOSIS — I779 Disorder of arteries and arterioles, unspecified: Secondary | ICD-10-CM

## 2018-03-04 MED ORDER — HYDROCORTISONE 1 % EX CREA: 1.0000 "application " | TOPICAL_CREAM | Freq: Two times a day (BID) | CUTANEOUS | 0 refills | Status: DC

## 2018-03-04 MED ORDER — CLOPIDOGREL BISULFATE 75 MG PO TABS: 75.0000 mg | ORAL_TABLET | Freq: Every day | ORAL | 3 refills | Status: DC

## 2018-03-04 NOTE — Progress Notes (Signed)
MRN : [id number]  Sara [name] is a 67 y.o. ([birth year]) female who presents with chief complaint of  Chief Complaint  Patient presents with  . Follow-up    right leg reflux  .  History of Present Illness:   The patient returns to the office for followup evaluation regarding right leg pain and swelling.  The pain and swelling has improved quite a bit. The redness has decreased as well.  There have not been any interval development of a ulcerations or wounds.  Since the previous visit the patient has been wearing graduated compression stockings and has noted little significant improvement in the lymphedema. The patient has been using compression routinely morning until night.  The patient also states elevation during the day and exercise is being done too.     Current Meds  Medication Sig  . atorvastatin (LIPITOR) 40 MG tablet   . buPROPion (WELLBUTRIN XL) 150 MG 24 hr tablet Take by mouth.  . clobetasol cream (TEMOVATE) 4.65 % Apply 1 application topically 2 (two) times daily.  . clopidogrel (PLAVIX) 75 MG tablet TAKE 1 TABLET BY MOUTH  DAILY  . diltiazem (CARDIZEM) 90 MG tablet Take by mouth.  . ibandronate (BONIVA) 150 MG tablet   . isosorbide mononitrate (IMDUR) 30 MG 24 hr tablet Take by mouth.  . losartan (COZAAR) 25 MG tablet   . nitroGLYCERIN (NITROSTAT) 0.4 MG SL tablet Place 1 tablet (0.4 mg total) under the tongue every 5 (five) minutes as needed for chest pain.  . pantoprazole (PROTONIX) 40 MG tablet TAKE 1 TABLET BY MOUTH TWO  TIMES DAILY  . PROLIA 60 MG/ML SOLN injection Inject 60 mg into the skin every 6 (six) months.   . venlafaxine XR (EFFEXOR-XR) 75 MG 24 hr capsule TAKE 1 CAPSULE BY MOUTH  DAILY WITH BREAKFAST  . warfarin (COUMADIN) 2 MG tablet Take 1 tablet (2 mg total) by mouth 2 (two) times daily. TWICE DAILY  (Pt taking 4 mg in AM)    Past Medical History:  Diagnosis Date  . Depression   . GERD (gastroesophageal reflux disease)   . History of blood  clots   . Mild mitral regurgitation   . Osteoporosis   . Stomach ulcer   . Vascular disease    Sees Dr. Delana Meyer  . Vertigo    Last episode approx Aug 2015  . Wears dentures    full upper    Past Surgical History:  Procedure Laterality Date  . BREAST CYST ASPIRATION Left   . CARDIAC CATHETERIZATION  02/15/2017   [facility]  . COLONOSCOPY WITH PROPOFOL N/A 10/22/2017   Procedure: COLONOSCOPY WITH PROPOFOL;  Surgeon: Lucilla Lame, MD;  Location: [place];  Service: Endoscopy;  Laterality: N/A;  specimens not taken--pt on Plavix will be brought back in after 7 days off med  . COLONOSCOPY WITH PROPOFOL N/A 11/05/2017   Procedure: COLONOSCOPY WITH PROPOFOL;  Surgeon: Lucilla Lame, MD;  Location: [place];  Service: Endoscopy;  Laterality: N/A;  . ESOPHAGOGASTRODUODENOSCOPY N/A 12/21/2014   Procedure: ESOPHAGOGASTRODUODENOSCOPY (EGD);  Surgeon: Lucilla Lame, MD;  Location: [place];  Service: Gastroenterology;  Laterality: N/A;  . ESOPHAGOGASTRODUODENOSCOPY (EGD) WITH PROPOFOL N/A 02/08/2016   Procedure: ESOPHAGOGASTRODUODENOSCOPY (EGD) WITH PROPOFOL;  Surgeon: Lucilla Lame, MD;  Location: [facility];  Service: Endoscopy;  Laterality: N/A;  . HEMORROIDECTOMY  2014  . PERIPHERAL VASCULAR CATHETERIZATION N/A 02/09/2016   Procedure: Abdominal Aortogram w/Lower Extremity;  Surgeon: [name], MD;  Location: [place] [facility];  Service: Cardiovascular;  Laterality: N/A;  . PERIPHERAL VASCULAR CATHETERIZATION Right 02/10/2016   Procedure: Lower Extremity Angiography;  Surgeon: [name], MD;  Location: [place] [facility];  Service: Cardiovascular;  Laterality: Right;  . POLYPECTOMY  11/05/2017   Procedure: POLYPECTOMY;  Surgeon: Lucilla Lame, MD;  Location: [place];  Service: Endoscopy;;  . TONSILECTOMY, ADENOIDECTOMY, BILATERAL MYRINGOTOMY AND TUBES  1968  . VASCULAR SURGERY  5465,6812   Fem-Pop Bypass    Social History Social History    Tobacco Use  . Smoking status: Former Smoker    Packs/day: 2.00    Years: 25.00    Pack years: 50.00    Types: Cigarettes    Last attempt to quit: 08/22/1991    Years since quitting: 26.5  . Smokeless tobacco: Never Used  Substance Use Topics  . Alcohol use: No    Alcohol/week: 0.0 oz  . Drug use: No    Family History Family History  Problem Relation Age of Onset  . CVA Mother   . Heart attack Mother   . Cancer Father        colon cancer  . Colon cancer Father   . Heart disease Maternal Uncle   . Breast cancer Neg Hx     Allergies  Allergen Reactions  . Amoxicillin Other (See Comments)    Yeast infection  . Vicodin [Hydrocodone-Acetaminophen] Hives and Rash     REVIEW OF SYSTEMS (Negative unless checked)  Constitutional: [] Weight loss  [] Fever  [] Chills Cardiac: [] Chest pain   [] Chest pressure   [] Palpitations   [] Shortness of breath when laying flat   [] Shortness of breath with exertion. Vascular:  [x] Pain in legs with walking   [x] Pain in legs at rest  [] History of DVT   [] Phlebitis   [x] Swelling in legs   [x] Varicose veins   [] Non-healing ulcers Pulmonary:   [] Uses home oxygen   [] Productive cough   [] Hemoptysis   [] Wheeze  [] COPD   [] Asthma Neurologic:  [] Dizziness   [] Seizures   [] History of stroke   [] History of TIA  [] Aphasia   [] Vissual changes   [] Weakness or numbness in arm   [] Weakness or numbness in leg Musculoskeletal:   [] Joint swelling   [] Joint pain   [] Low back pain Hematologic:  [] Easy bruising  [] Easy bleeding   [] Hypercoagulable state   [] Anemic Gastrointestinal:  [] Diarrhea   [] Vomiting  [] Gastroesophageal reflux/heartburn   [] Difficulty swallowing. Genitourinary:  [] Chronic kidney disease   [] Difficult urination  [] Frequent urination   [] Blood in urine Skin:  [x] Rashes   [] Ulcers  Psychological:  [] History of anxiety   []  History of major depression.  Physical Examination  Vitals:   03/04/18 1148  BP: 128/82  Pulse: 71  Resp: 17  Weight:  138 lb (62.6 kg)  Height: 5\' 5"  (1.651 m)   Body mass index is 22.96 kg/m. Gen: WD/WN, NAD Head: [place]/AT, No temporalis wasting.  Ear/Nose/Throat: Hearing grossly intact, nares w/o erythema or drainage Eyes: PER, EOMI, sclera nonicteric.  Neck: Supple, no large masses.   Pulmonary:  Good air movement, no audible wheezing bilaterally, no use of accessory muscles.  Cardiac: RRR, no JVD Vascular: scattered varicosities present bilaterally right much more than left leg.  Predominately in the ankle  Mild venous stasis changes to the legs bilaterally but on the right there is an area that appear to be eczema.  1-2+ soft pitting edema Vessel Right Left  Radial Palpable Palpable  PT Trace Palpable Trace Palpable  DP  Not Palpable notPalpable  Gastrointestinal: Non-distended. No guarding/no peritoneal signs.  Musculoskeletal: M/S 5/5 throughout.  No deformity or atrophy.  Neurologic: CN 2-12 intact. Symmetrical.  Speech is fluent. Motor exam as listed above. Psychiatric: Judgment intact, Mood & affect appropriate for pt's clinical situation. Dermatologic: mild venous rashes with an area of eczema no ulcers noted.  No changes consistent with cellulitis. Lymph : No lichenification or skin changes of chronic lymphedema.  CBC Lab Results  Component Value Date   WBC 16.2 (H) 02/14/2016   HGB 12.7 02/14/2016   HCT 37.5 02/14/2016   MCV 95.2 02/14/2016   PLT 116 (L) 02/14/2016    BMET    Component Value Date/Time   NA 138 05/16/2016 1034   NA 139 09/21/2015 0916   K 4.5 05/16/2016 1034   CL 106 05/16/2016 1034   CO2 21 05/16/2016 1034   GLUCOSE 103 (H) 05/16/2016 1034   BUN 12 05/16/2016 1034   BUN 12 09/21/2015 0916   CREATININE 0.88 05/16/2016 1034   CALCIUM 8.8 05/16/2016 1034   GFRNONAA 66 05/01/2016 0846   GFRAA 76 05/01/2016 0846   CrCl cannot be calculated (Patient's most recent lab result is older than the maximum 21 days allowed.).  COAG Lab Results  Component Value Date     INR 2.08 02/14/2018   INR 2.21 01/24/2018   INR 2.15 01/17/2018    Radiology No results found.    Assessment/Plan 1. Peripheral arterial occlusive disease (HCC)  Recommend:  The patient has evidence of atherosclerosis of the lower extremities with claudication.  The patient does not voice lifestyle limiting changes at this point in time.  Noninvasive studies do not suggest clinically significant change.  No invasive studies, angiography or surgery at this time The patient should continue walking and begin a more formal exercise program.  The patient should continue antiplatelet therapy and aggressive treatment of the lipid abnormalities  No changes in the patient's medications at this time  The patient should continue wearing graduated compression socks 15-20 mmHg strength to control the mild edema.    2. Chronic venous insufficiency No surgery or intervention at this point in time.    I have had a long discussion with the patient regarding venous insufficiency and why it  causes symptoms. I have discussed with the patient the chronic skin changes that accompany venous insufficiency and the long term sequela such as infection and ulceration.  Patient will begin wearing graduated compression stockings class 1 (20-30 mmHg) or compression wraps on a daily basis a prescription was given. The patient will put the stockings on first thing in the morning and removing them in the evening. The patient is instructed specifically not to sleep in the stockings.    In addition, behavioral modification including several periods of elevation of the lower extremities during the day will be continued. I have demonstrated that proper elevation is a position with the ankles at heart level.  The patient is instructed to begin routine exercise, especially walking on a daily basis  3. Eczema of lower extremity Will Rx hydrocortisone cream to treat this area  4. Coronary artery disease involving  native coronary artery of native heart with angina pectoris ([place]) Continue cardiac and antihypertensive medications as already ordered and reviewed, no changes at this time.  Continue statin as ordered and reviewed, no changes at this time  Nitrates PRN for chest pain   5. Stenosis of right carotid artery Recommend:  Given the patient's asymptomatic subcritical stenosis no  further invasive testing or surgery at this time.  Previous duplex ultrasound shows <50% stenosis bilaterally.  Continue antiplatelet therapy as prescribed Continue management of CAD, HTN and Hyperlipidemia Healthy heart diet,  encouraged exercise at least 4 times per week   6. Gastroesophageal reflux disease without esophagitis Continue antihypertensive medications as already ordered, these medications have been reviewed and there are no changes at this time.  Avoidence of caffeine and alcohol  Moderate elevation of the head of the bed   7. Other hyperlipidemia Continue statin as ordered and reviewed, no changes at this time      [name], MD  03/04/2018 11:52 AM

## 2018-03-07 ENCOUNTER — Other Ambulatory Visit
Admission: RE | Admit: 2018-03-07 | Discharge: 2018-03-07 | Disposition: A | Discharge status: Home / Self Care | Payer: Medicare Other | Source: Ambulatory Visit | Attending: Vascular Surgery | Admitting: Vascular Surgery

## 2018-03-07 ENCOUNTER — Other Ambulatory Visit (INDEPENDENT_AMBULATORY_CARE_PROVIDER_SITE_OTHER): Payer: Self-pay | Admitting: Vascular Surgery

## 2018-03-07 DIAGNOSIS — I739 Peripheral vascular disease, unspecified: Secondary | ICD-10-CM | POA: Insufficient documentation

## 2018-03-07 LAB — PROTIME-INR
INR: 2.1
PROTHROMBIN TIME: 23.4 s — AB (ref 11.4–15.2)

## 2018-03-08 ENCOUNTER — Telehealth (INDEPENDENT_AMBULATORY_CARE_PROVIDER_SITE_OTHER): Payer: Self-pay

## 2018-03-08 NOTE — Telephone Encounter (Signed)
FYI--Pharmacy called to make sure that the doctor is aware that the newly prescribed Plavix will interact with the pantoprazole that the patient is already taking and being prescribed.  I let them know that it was doctors orders, and that it is okay for them to go ahead and fill this medication.

## 2018-03-28 ENCOUNTER — Other Ambulatory Visit
Admission: RE | Admit: 2018-03-28 | Discharge: 2018-03-28 | Disposition: A | Discharge status: Home / Self Care | Payer: Medicare Other | Source: Ambulatory Visit | Attending: Vascular Surgery | Admitting: Vascular Surgery

## 2018-03-28 ENCOUNTER — Other Ambulatory Visit (INDEPENDENT_AMBULATORY_CARE_PROVIDER_SITE_OTHER): Payer: Self-pay | Admitting: Vascular Surgery

## 2018-03-28 DIAGNOSIS — I739 Peripheral vascular disease, unspecified: Secondary | ICD-10-CM

## 2018-03-28 LAB — PROTIME-INR
INR: 2.46
PROTHROMBIN TIME: 26.5 s — AB (ref 11.4–15.2)

## 2018-04-18 ENCOUNTER — Other Ambulatory Visit
Admission: RE | Admit: 2018-04-18 | Discharge: 2018-04-18 | Disposition: A | Discharge status: Home / Self Care | Payer: Medicare Other | Source: Ambulatory Visit | Attending: Vascular Surgery | Admitting: Vascular Surgery

## 2018-04-18 ENCOUNTER — Other Ambulatory Visit (INDEPENDENT_AMBULATORY_CARE_PROVIDER_SITE_OTHER): Payer: Self-pay | Admitting: Vascular Surgery

## 2018-04-18 DIAGNOSIS — I739 Peripheral vascular disease, unspecified: Secondary | ICD-10-CM | POA: Diagnosis present

## 2018-04-18 LAB — PROTIME-INR
INR: 1.98
PROTHROMBIN TIME: 22.3 s — AB (ref 11.4–15.2)

## 2018-04-19 ENCOUNTER — Other Ambulatory Visit: Payer: Self-pay | Admitting: Nurse Practitioner

## 2018-04-19 DIAGNOSIS — Z1231 Encounter for screening mammogram for malignant neoplasm of breast: Secondary | ICD-10-CM

## 2018-04-25 ENCOUNTER — Other Ambulatory Visit (INDEPENDENT_AMBULATORY_CARE_PROVIDER_SITE_OTHER): Payer: Self-pay | Admitting: Vascular Surgery

## 2018-04-25 ENCOUNTER — Other Ambulatory Visit
Admission: RE | Admit: 2018-04-25 | Discharge: 2018-04-25 | Disposition: A | Discharge status: Home / Self Care | Payer: Medicare Other | Source: Ambulatory Visit | Attending: Vascular Surgery | Admitting: Vascular Surgery

## 2018-04-25 DIAGNOSIS — I739 Peripheral vascular disease, unspecified: Secondary | ICD-10-CM | POA: Insufficient documentation

## 2018-04-25 LAB — PROTIME-INR
INR: 2.97
Prothrombin Time: 30.7 seconds — ABNORMAL HIGH (ref 11.4–15.2)

## 2018-05-08 ENCOUNTER — Encounter

## 2018-05-08 ENCOUNTER — Encounter (INDEPENDENT_AMBULATORY_CARE_PROVIDER_SITE_OTHER): Payer: Medicare Other

## 2018-05-08 ENCOUNTER — Ambulatory Visit (INDEPENDENT_AMBULATORY_CARE_PROVIDER_SITE_OTHER): Payer: Medicare Other | Admitting: Vascular Surgery

## 2018-05-16 ENCOUNTER — Other Ambulatory Visit
Admission: RE | Admit: 2018-05-16 | Discharge: 2018-05-16 | Disposition: A | Discharge status: Home / Self Care | Payer: Medicare Other | Source: Ambulatory Visit | Attending: Vascular Surgery | Admitting: Vascular Surgery

## 2018-05-16 DIAGNOSIS — I739 Peripheral vascular disease, unspecified: Secondary | ICD-10-CM

## 2018-05-16 LAB — PROTIME-INR
INR: 3.16
PROTHROMBIN TIME: 32.2 s — AB (ref 11.4–15.2)

## 2018-05-17 ENCOUNTER — Telehealth (INDEPENDENT_AMBULATORY_CARE_PROVIDER_SITE_OTHER): Payer: Self-pay

## 2018-05-17 ENCOUNTER — Other Ambulatory Visit (INDEPENDENT_AMBULATORY_CARE_PROVIDER_SITE_OTHER): Payer: Self-pay | Admitting: Nurse Practitioner

## 2018-05-17 DIAGNOSIS — I779 Disorder of arteries and arterioles, unspecified: Secondary | ICD-10-CM

## 2018-05-17 NOTE — Telephone Encounter (Signed)
Take 1 (2mg ) tab on Mondays, continue to take 2 (2mg ) tabs on other days.  Recheck INR in two weeks. (Week of 10/14).

## 2018-05-17 NOTE — Telephone Encounter (Signed)
Sara [name] has the INR results.  Please touch base with her.  Thanks.

## 2018-05-17 NOTE — Telephone Encounter (Signed)
Called the patient back to let her know to take only 2mg  of the Coumadin on Mondays, and to stay taking the 4mg  the rest of the week and to recheck her INR in 2 weeks. Had to leave a message because there was no answer.

## 2018-05-17 NOTE — Telephone Encounter (Signed)
Patient had her INR checked yesterday and she would like to know if you want to keep her Coumadin where it is or change it?  Her INR was 3.16

## 2018-05-30 ENCOUNTER — Other Ambulatory Visit (INDEPENDENT_AMBULATORY_CARE_PROVIDER_SITE_OTHER): Payer: Self-pay | Admitting: Nurse Practitioner

## 2018-05-30 ENCOUNTER — Other Ambulatory Visit
Admission: RE | Admit: 2018-05-30 | Discharge: 2018-05-30 | Disposition: A | Discharge status: Home / Self Care | Payer: Medicare Other | Source: Ambulatory Visit | Attending: Nurse Practitioner | Admitting: Nurse Practitioner

## 2018-05-30 DIAGNOSIS — I779 Disorder of arteries and arterioles, unspecified: Secondary | ICD-10-CM

## 2018-05-30 LAB — PROTIME-INR
INR: 1.32
PROTHROMBIN TIME: 16.3 s — AB (ref 11.4–15.2)

## 2018-06-13 ENCOUNTER — Other Ambulatory Visit
Admission: RE | Admit: 2018-06-13 | Discharge: 2018-06-13 | Disposition: A | Discharge status: Home / Self Care | Payer: Medicare Other | Source: Ambulatory Visit | Attending: Nurse Practitioner | Admitting: Nurse Practitioner

## 2018-06-13 ENCOUNTER — Other Ambulatory Visit (INDEPENDENT_AMBULATORY_CARE_PROVIDER_SITE_OTHER): Payer: Self-pay | Admitting: Nurse Practitioner

## 2018-06-13 DIAGNOSIS — I739 Peripheral vascular disease, unspecified: Secondary | ICD-10-CM

## 2018-06-13 DIAGNOSIS — I779 Disorder of arteries and arterioles, unspecified: Secondary | ICD-10-CM | POA: Diagnosis present

## 2018-06-13 LAB — PROTIME-INR
INR: 1.99
PROTHROMBIN TIME: 22.3 s — AB (ref 11.4–15.2)

## 2018-06-13 NOTE — Progress Notes (Signed)
Her INR is quite nearly in range at 1.99.  She should take one extra 2mg  tab on Mondays (10/28 and 11/4) for a total of 6 mg on those days. She should take normal dose of 4 mg on other days.  Recheck week of 11/11 (2 weeks).

## 2018-06-24 ENCOUNTER — Other Ambulatory Visit (INDEPENDENT_AMBULATORY_CARE_PROVIDER_SITE_OTHER): Payer: Self-pay | Admitting: Vascular Surgery

## 2018-06-27 ENCOUNTER — Other Ambulatory Visit
Admission: RE | Admit: 2018-06-27 | Discharge: 2018-06-27 | Disposition: A | Discharge status: Home / Self Care | Payer: Medicare Other | Source: Ambulatory Visit | Attending: Nurse Practitioner | Admitting: Nurse Practitioner

## 2018-06-27 DIAGNOSIS — I739 Peripheral vascular disease, unspecified: Secondary | ICD-10-CM | POA: Diagnosis present

## 2018-06-27 LAB — PROTIME-INR
INR: 1.96
Prothrombin Time: 22.1 seconds — ABNORMAL HIGH (ref 11.4–15.2)

## 2018-06-27 NOTE — Addendum Note (Signed)
Addended by: Santiago Bur on: 06/27/2018 10:20 AM   Modules accepted: Orders

## 2018-06-28 ENCOUNTER — Telehealth (INDEPENDENT_AMBULATORY_CARE_PROVIDER_SITE_OTHER): Payer: Self-pay

## 2018-06-28 ENCOUNTER — Other Ambulatory Visit (INDEPENDENT_AMBULATORY_CARE_PROVIDER_SITE_OTHER): Payer: Self-pay | Admitting: Nurse Practitioner

## 2018-06-28 DIAGNOSIS — I739 Peripheral vascular disease, unspecified: Secondary | ICD-10-CM

## 2018-06-28 NOTE — Telephone Encounter (Signed)
Patient called to find out what her results of the INR were?  And if she needs any adjustments in the medication, or to stay on the same dosage?

## 2018-06-28 NOTE — Telephone Encounter (Signed)
I sent a message earlier, but somehow it didn't come through  Please call Sara [name] and give her these instructions for her Coumadin Management.Thanks  Her INR was 1.96  Take 3 tablets (6 mg) Monday and Tuesday Take 2 Tablets (4mg ) the rest of the week.  We will check INR 11/15 before her appt on 11/18.

## 2018-06-28 NOTE — Progress Notes (Signed)
Please call Ms. Forni and give her these instructions for her Coumadin Management.Thanks  Take 3 tablets (6 mg) Monday and Tuesday Take 2 Tablets (4mg ) the rest of the week.  We will check INR 11/15 before her appt on 10/18.

## 2018-06-28 NOTE — Telephone Encounter (Signed)
I called the patient to let her know what the instructions are for her follow ing the INR check, per Arna Medici. She will have her INR rechecked on 07/05/18.

## 2018-07-04 ENCOUNTER — Other Ambulatory Visit (INDEPENDENT_AMBULATORY_CARE_PROVIDER_SITE_OTHER): Payer: Self-pay | Admitting: Nurse Practitioner

## 2018-07-04 ENCOUNTER — Other Ambulatory Visit
Admission: RE | Admit: 2018-07-04 | Discharge: 2018-07-04 | Disposition: A | Discharge status: Home / Self Care | Payer: Medicare Other | Source: Ambulatory Visit | Attending: Nurse Practitioner | Admitting: Nurse Practitioner

## 2018-07-04 DIAGNOSIS — I739 Peripheral vascular disease, unspecified: Secondary | ICD-10-CM | POA: Insufficient documentation

## 2018-07-04 LAB — PROTIME-INR
INR: 2.57
Prothrombin Time: 27.2 seconds — ABNORMAL HIGH (ref 11.4–15.2)

## 2018-07-04 NOTE — Addendum Note (Signed)
Addended by: Santiago Bur on: 07/04/2018 10:41 AM   Modules accepted: Orders

## 2018-07-04 NOTE — Progress Notes (Signed)
Please call Ms. Hirth with her results.  It's Perfect! INR is 2.57  Continue to take:   Take 3 tablets (6 mg) Monday and Tuesday Take 2 Tablets (4mg ) the rest of the week.  We will recheck in 2 weeks.

## 2018-07-08 ENCOUNTER — Ambulatory Visit (INDEPENDENT_AMBULATORY_CARE_PROVIDER_SITE_OTHER): Payer: Medicare Other

## 2018-07-08 ENCOUNTER — Encounter (INDEPENDENT_AMBULATORY_CARE_PROVIDER_SITE_OTHER): Payer: Medicare Other

## 2018-07-08 ENCOUNTER — Encounter (INDEPENDENT_AMBULATORY_CARE_PROVIDER_SITE_OTHER): Payer: Self-pay | Admitting: Vascular Surgery

## 2018-07-08 ENCOUNTER — Ambulatory Visit (INDEPENDENT_AMBULATORY_CARE_PROVIDER_SITE_OTHER): Payer: Medicare Other | Admitting: Vascular Surgery

## 2018-07-08 ENCOUNTER — Ambulatory Visit (INDEPENDENT_AMBULATORY_CARE_PROVIDER_SITE_OTHER): Payer: 59 | Admitting: Vascular Surgery

## 2018-07-08 VITALS — BP 123/80 | HR 81 | Resp 17 | Ht 63.0 in | Wt 141.0 lb

## 2018-07-08 DIAGNOSIS — I10 Essential (primary) hypertension: Secondary | ICD-10-CM

## 2018-07-08 DIAGNOSIS — I779 Disorder of arteries and arterioles, unspecified: Secondary | ICD-10-CM

## 2018-07-08 DIAGNOSIS — I6523 Occlusion and stenosis of bilateral carotid arteries: Secondary | ICD-10-CM | POA: Diagnosis not present

## 2018-07-08 DIAGNOSIS — I872 Venous insufficiency (chronic) (peripheral): Secondary | ICD-10-CM

## 2018-07-08 DIAGNOSIS — K219 Gastro-esophageal reflux disease without esophagitis: Secondary | ICD-10-CM

## 2018-07-08 NOTE — Progress Notes (Signed)
MRN : [id number]  Sara [name] is a 67 y.o. (May 26, 1951) female who presents with chief complaint of  Chief Complaint  Patient presents with  . Follow-up    ABI and LE Arterial f/u  .  History of Present Illness:   The patient returns to the office for followup and review of the noninvasive studies. There have been no interval changes in lower extremity symptoms. No interval shortening of the patient's claudication distance or development of rest pain symptoms. No new ulcers or wounds have occurred since the last visit.  There have been no significant changes to the patient's overall health care.  The patient denies history of DVT, PE or superficial thrombophlebitis. The patient denies recent episodes of angina or shortness of breath.   ABI Rt=1.07 and Lt=1.03  (previous ABI's Rt=1.11 and Lt=1.08) Duplex ultrasound of the right leg shows the bypass is widely patent no hemodynamically significant stenosis identified  Current Meds  Medication Sig  . albuterol (PROAIR HFA) 108 (90 Base) MCG/ACT inhaler   . atorvastatin (LIPITOR) 40 MG tablet   . buPROPion (WELLBUTRIN XL) 150 MG 24 hr tablet Take by mouth.  . clobetasol cream (TEMOVATE) 4.09 % Apply 1 application topically 2 (two) times daily.  . clopidogrel (PLAVIX) 75 MG tablet Take 1 tablet (75 mg total) by mouth daily.  Marland Kitchen diltiazem (CARDIZEM) 90 MG tablet Take by mouth.  . escitalopram (LEXAPRO) 5 MG tablet TK 1 T PO QHS  . hydrocortisone cream 1 % Apply 1 application topically 2 (two) times daily. To right ankle  . hydrOXYzine (ATARAX/VISTARIL) 10 MG tablet TK 1 T PO Q 8 H PRF MOMENTS OF HIGH ANXIETY AND SLEEP  . ibandronate (BONIVA) 150 MG tablet   . isosorbide mononitrate (IMDUR) 30 MG 24 hr tablet Take by mouth.  . losartan (COZAAR) 25 MG tablet   . nitroGLYCERIN (NITROSTAT) 0.4 MG SL tablet Place 1 tablet (0.4 mg total) under the tongue every 5 (five) minutes as needed for chest pain.  . pantoprazole (PROTONIX) 40 MG  tablet TAKE 1 TABLET BY MOUTH TWO  TIMES DAILY  . polyethylene glycol (GOLYTELY) 236 g solution Drink one 8 oz glass every 20 mins until stools are clear starting at 5:00pm on 11/04/17  . PROLIA 60 MG/ML SOLN injection Inject 60 mg into the skin every 6 (six) months.   . venlafaxine XR (EFFEXOR-XR) 75 MG 24 hr capsule TAKE 1 CAPSULE BY MOUTH  DAILY WITH BREAKFAST  . warfarin (COUMADIN) 2 MG tablet TAKE 1 TABLET BY MOUTH TWICE DAILY    Past Medical History:  Diagnosis Date  . Depression   . GERD (gastroesophageal reflux disease)   . History of blood clots   . Mild mitral regurgitation   . Osteoporosis   . Stomach ulcer   . Vascular disease    Sees Dr. Delana Meyer  . Vertigo    Last episode approx Aug 2015  . Wears dentures    full upper    Past Surgical History:  Procedure Laterality Date  . BREAST CYST ASPIRATION Left   . CARDIAC CATHETERIZATION  02/15/2017   [facility]  . COLONOSCOPY WITH PROPOFOL N/A 10/22/2017   Procedure: COLONOSCOPY WITH PROPOFOL;  Surgeon: Lucilla Lame, MD;  Location: [place];  Service: Endoscopy;  Laterality: N/A;  specimens not taken--pt on Plavix will be brought back in after 7 days off med  . COLONOSCOPY WITH PROPOFOL N/A 11/05/2017   Procedure: COLONOSCOPY WITH PROPOFOL;  Surgeon: Lucilla Lame, MD;  Location:  [place];  Service: Endoscopy;  Laterality: N/A;  . ESOPHAGOGASTRODUODENOSCOPY N/A 12/21/2014   Procedure: ESOPHAGOGASTRODUODENOSCOPY (EGD);  Surgeon: Lucilla Lame, MD;  Location: [place];  Service: Gastroenterology;  Laterality: N/A;  . ESOPHAGOGASTRODUODENOSCOPY (EGD) WITH PROPOFOL N/A 02/08/2016   Procedure: ESOPHAGOGASTRODUODENOSCOPY (EGD) WITH PROPOFOL;  Surgeon: Lucilla Lame, MD;  Location: [facility];  Service: Endoscopy;  Laterality: N/A;  . HEMORROIDECTOMY  2014  . PERIPHERAL VASCULAR CATHETERIZATION N/A 02/09/2016   Procedure: Abdominal Aortogram w/Lower Extremity;  Surgeon: [name], MD;  Location: [place] [facility];  Service: Cardiovascular;  Laterality: N/A;  . PERIPHERAL VASCULAR CATHETERIZATION Right 02/10/2016   Procedure: Lower Extremity Angiography;  Surgeon: [name], MD;  Location: [place] [facility];  Service: Cardiovascular;  Laterality: Right;  . POLYPECTOMY  11/05/2017   Procedure: POLYPECTOMY;  Surgeon: Lucilla Lame, MD;  Location: [place];  Service: Endoscopy;;  . TONSILECTOMY, ADENOIDECTOMY, BILATERAL MYRINGOTOMY AND TUBES  1968  . VASCULAR SURGERY  [id number]   Fem-Pop Bypass    Social History Social History   Tobacco Use  . Smoking status: Former Smoker    Packs/day: 2.00    Years: 25.00    Pack years: 50.00    Types: Cigarettes    Last attempt to quit: 08/22/1991    Years since quitting: 26.8  . Smokeless tobacco: Never Used  Substance Use Topics  . Alcohol use: No    Alcohol/week: 0.0 standard drinks  . Drug use: No    Family History Family History  Problem Relation Age of Onset  . CVA Mother   . Heart attack Mother   . Cancer Father        colon cancer  . Colon cancer Father   . Heart disease Maternal Uncle   . Breast cancer Neg Hx     Allergies  Allergen Reactions  . Amoxicillin Other (See Comments)    Yeast infection  . Vicodin [Hydrocodone-Acetaminophen] Hives and Rash     REVIEW OF SYSTEMS (Negative unless checked)  Constitutional: [] Weight loss  [] Fever  [] Chills Cardiac: [] Chest pain   [] Chest pressure   [] Palpitations   [] Shortness of breath when laying flat   [] Shortness of breath with exertion. Vascular:  [] Pain in legs with walking   [] Pain in legs at rest  [] History of DVT   [] Phlebitis   [] Swelling in legs   [] Varicose veins   [] Non-healing ulcers Pulmonary:   [] Uses home oxygen   [] Productive cough   [] Hemoptysis   [] Wheeze  [] COPD   [] Asthma Neurologic:  [] Dizziness   [] Seizures   [] History of stroke   [] History of TIA  [] Aphasia   [] Vissual changes   [] Weakness or numbness in arm   [] Weakness or numbness  in leg Musculoskeletal:   [] Joint swelling   [] Joint pain   [] Low back pain Hematologic:  [] Easy bruising  [] Easy bleeding   [] Hypercoagulable state   [] Anemic Gastrointestinal:  [] Diarrhea   [] Vomiting  [] Gastroesophageal reflux/heartburn   [] Difficulty swallowing. Genitourinary:  [] Chronic kidney disease   [] Difficult urination  [] Frequent urination   [] Blood in urine Skin:  [] Rashes   [] Ulcers  Psychological:  [] History of anxiety   []  History of major depression.  Physical Examination  Vitals:   07/08/18 1035  BP: 123/80  Pulse: 81  Resp: 17  Weight: 141 lb (64 kg)  Height: 5\' 3"  (1.6 m)   Body mass index is 24.98 kg/m. Gen: WD/WN, NAD Head: [place]/AT, No temporalis wasting.  Ear/Nose/Throat: Hearing grossly  intact, nares w/o erythema or drainage Eyes: PER, EOMI, sclera nonicteric.  Neck: Supple, no large masses.   Pulmonary:  Good air movement, no audible wheezing bilaterally, no use of accessory muscles.  Cardiac: RRR, no JVD Vascular: carotid bruit Vessel Right Left  Radial Palpable Palpable  Carotid Palpable Palpable  PT Trace Palpable Trace Palpable  DP Not Palpable Not Palpable  Gastrointestinal: Non-distended. No guarding/no peritoneal signs.  Musculoskeletal: M/S 5/5 throughout.  No deformity or atrophy.  Neurologic: CN 2-12 intact. Symmetrical.  Speech is fluent. Motor exam as listed above. Psychiatric: Judgment intact, Mood & affect appropriate for pt's clinical situation. Dermatologic: No rashes or ulcers noted.  No changes consistent with cellulitis. Lymph : No lichenification or skin changes of chronic lymphedema.  CBC Lab Results  Component Value Date   WBC 16.2 (H) 02/14/2016   HGB 12.7 02/14/2016   HCT 37.5 02/14/2016   MCV 95.2 02/14/2016   PLT 116 (L) 02/14/2016    BMET    Component Value Date/Time   NA 138 05/16/2016 1034   NA 139 09/21/2015 0916   K 4.5 05/16/2016 1034   CL 106 05/16/2016 1034   CO2 21 05/16/2016 1034   GLUCOSE 103 (H)  05/16/2016 1034   BUN 12 05/16/2016 1034   BUN 12 09/21/2015 0916   CREATININE 0.88 05/16/2016 1034   CALCIUM 8.8 05/16/2016 1034   GFRNONAA 66 05/01/2016 0846   GFRAA 76 05/01/2016 0846   CrCl cannot be calculated (Patient's most recent lab result is older than the maximum 21 days allowed.).  COAG Lab Results  Component Value Date   INR 2.57 07/04/2018   INR 1.96 06/27/2018   INR 1.99 06/13/2018    Radiology No results found.   Assessment/Plan 1. Peripheral arterial occlusive disease (HCC)  Recommend:  The patient has evidence of atherosclerosis of the lower extremities with claudication.  The patient does not voice lifestyle limiting changes at this point in time.  Noninvasive studies do not suggest clinically significant change.  No invasive studies, angiography or surgery at this time The patient should continue walking and begin a more formal exercise program.  The patient should continue antiplatelet therapy and aggressive treatment of the lipid abnormalities  No changes in the patient's medications at this time  The patient should continue wearing graduated compression socks 10-15 mmHg strength to control the mild edema.   - VAS [place] LOWER EXTREMITY ARTERIAL DUPLEX; Future - VAS [place] ABI WITH/WO TBI; Future  2. Essential hypertension Continue antihypertensive medications as already ordered, these medications have been reviewed and there are no changes at this time.   3. Chronic venous insufficiency No surgery or intervention at this point in time.    I have had a long discussion with the patient regarding venous insufficiency and why it  causes symptoms. I have discussed with the patient the chronic skin changes that accompany venous insufficiency and the long term sequela such as infection and ulceration.  Patient will begin wearing graduated compression stockings class 1 (20-30 mmHg) or compression wraps on a daily basis a prescription was given. The patient will  put the stockings on first thing in the morning and removing them in the evening. The patient is instructed specifically not to sleep in the stockings.    In addition, behavioral modification including several periods of elevation of the lower extremities during the day will be continued. I have demonstrated that proper elevation is a position with the ankles at heart level.  The patient  is instructed to begin routine exercise, especially walking on a daily basis   4. Bilateral carotid artery stenosis Recommend:  Given the patient's asymptomatic subcritical stenosis no further invasive testing or surgery at this time.  Continue antiplatelet therapy as prescribed Continue management of CAD, HTN and Hyperlipidemia Healthy heart diet,  encouraged exercise at least 4 times per week Follow up in 12  months with duplex ultrasound and physical exam   - VAS US CAROTID; Future  5. Gastroesophageal reflux disease without esophagitis Continue PPI as already ordered, this medication has been reviewed and there are no changes at this time.  Avoidence of caffeine and alcohol  Moderate elevation of the head of the bed    [name], MD  07/08/2018 10:46 AM

## 2018-07-21 ENCOUNTER — Other Ambulatory Visit (INDEPENDENT_AMBULATORY_CARE_PROVIDER_SITE_OTHER): Payer: Self-pay | Admitting: Vascular Surgery

## 2018-07-22 ENCOUNTER — Other Ambulatory Visit (INDEPENDENT_AMBULATORY_CARE_PROVIDER_SITE_OTHER): Payer: Self-pay | Admitting: Nurse Practitioner

## 2018-07-22 ENCOUNTER — Other Ambulatory Visit
Admission: RE | Admit: 2018-07-22 | Discharge: 2018-07-22 | Disposition: A | Discharge status: Home / Self Care | Payer: Medicare Other | Source: Ambulatory Visit | Attending: Nurse Practitioner | Admitting: Nurse Practitioner

## 2018-07-22 DIAGNOSIS — I739 Peripheral vascular disease, unspecified: Secondary | ICD-10-CM | POA: Insufficient documentation

## 2018-07-22 LAB — PROTIME-INR
INR: 2.25
Prothrombin Time: 24.6 seconds — ABNORMAL HIGH (ref 11.4–15.2)

## 2018-07-22 NOTE — Progress Notes (Signed)
Please call Ms. Weiss with her results. It's doing good still. INR is 2.25  Continue to take:   Take 3 tablets (6 mg) Monday and Tuesday Take 2 Tablets (4mg ) the rest of the week.  We will recheck in 2 weeks. If the next INR is within range we will go to every 4 weeks.

## 2018-08-05 ENCOUNTER — Other Ambulatory Visit
Admission: RE | Admit: 2018-08-05 | Discharge: 2018-08-05 | Disposition: A | Discharge status: Home / Self Care | Payer: Medicare Other | Attending: Nurse Practitioner | Admitting: Nurse Practitioner

## 2018-08-05 ENCOUNTER — Telehealth (INDEPENDENT_AMBULATORY_CARE_PROVIDER_SITE_OTHER): Payer: Self-pay | Admitting: Nurse Practitioner

## 2018-08-05 ENCOUNTER — Other Ambulatory Visit (INDEPENDENT_AMBULATORY_CARE_PROVIDER_SITE_OTHER): Payer: Self-pay | Admitting: Nurse Practitioner

## 2018-08-05 ENCOUNTER — Telehealth (INDEPENDENT_AMBULATORY_CARE_PROVIDER_SITE_OTHER): Payer: Self-pay

## 2018-08-05 DIAGNOSIS — I739 Peripheral vascular disease, unspecified: Secondary | ICD-10-CM | POA: Insufficient documentation

## 2018-08-05 LAB — PROTIME-INR
INR: 3.46
PROTHROMBIN TIME: 34.3 s — AB (ref 11.4–15.2)

## 2018-08-05 NOTE — Telephone Encounter (Signed)
Patient called and stated that the reason for her INR being elevated is because she was on prednisone for 5 days.  She states that she was taking 2 tabs daily for 5 days, and she questions that since her INR is elevated, then should her dosage be lowered instead of increasing in to 6mg  and then back down to 4 mg for the rest of the week for the next 2 weeks?

## 2018-08-05 NOTE — Telephone Encounter (Signed)
The prednisone caused an elevation in her INR, which should not be long lasting.  The previous dosage that she was taking was working well (6 mg on Mon/tue, 4 mg all other days).  My fear is that if I lower you all the way back down, we will again be too low and have to find the right mix again once the prednisone is out of your system.  This is also why I would like you to skip a dose today in order to bring you down near the 3 range.  I'm also lowering the overall dose you are getting each week (although it may not seem like it). Until this prednisone is out of your system we may have to keep tweaking the dosage until we find the right balance again.

## 2018-08-05 NOTE — Addendum Note (Signed)
Addended by: Santiago Bur on: 08/05/2018 10:34 AM   Modules accepted: Orders

## 2018-08-05 NOTE — Telephone Encounter (Signed)
Called the patient back and left her another message giving her the information that was instructed by Arna Medici.

## 2018-08-15 ENCOUNTER — Encounter

## 2018-08-15 ENCOUNTER — Ambulatory Visit (INDEPENDENT_AMBULATORY_CARE_PROVIDER_SITE_OTHER): Payer: Medicare Other | Admitting: Gastroenterology

## 2018-08-15 ENCOUNTER — Encounter: Payer: Self-pay | Admitting: Gastroenterology

## 2018-08-15 VITALS — BP 107/67 | HR 116 | Ht 63.0 in | Wt 142.0 lb

## 2018-08-15 DIAGNOSIS — I779 Disorder of arteries and arterioles, unspecified: Secondary | ICD-10-CM | POA: Diagnosis not present

## 2018-08-15 DIAGNOSIS — K219 Gastro-esophageal reflux disease without esophagitis: Secondary | ICD-10-CM | POA: Diagnosis not present

## 2018-08-15 NOTE — Progress Notes (Signed)
Primary Care Physician: Danelle Berry, NP  Primary Gastroenterologist:  Dr. Lucilla Lame  Chief Complaint  Patient presents with  . burning stomach    HPI: Sara [name] is a 67 y.o. female here who has a history of gastritis and had an upper endoscopy in June 2017 that showed the gastritis to have resolved.  There was an irregular Z line seen at that time consistent with chronic reflux.  The patient had a colonoscopy in March of this year that showed the patient to have an adenomatous polyp.  It was recommended that the patient have a repeat colonoscopy in 5 years.  The patient reports that she was on Protonix twice a day and approximately 1 year ago was told to go back to once a day which she has been taking every morning.  Starting this past September she reports that her symptoms started to get worse with reflux symptoms and epigastric pain.  She decided that she would come back and see me today because of those symptoms.  Current Outpatient Medications  Medication Sig Dispense Refill  . atorvastatin (LIPITOR) 40 MG tablet     . clopidogrel (PLAVIX) 75 MG tablet Take 1 tablet (75 mg total) by mouth daily. 90 tablet 3  . diltiazem (CARDIZEM) 90 MG tablet Take by mouth.    . escitalopram (LEXAPRO) 5 MG tablet TK 1 T PO QHS  1  . hydrOXYzine (ATARAX/VISTARIL) 10 MG tablet TK 1 T PO Q 8 H PRF MOMENTS OF HIGH ANXIETY AND SLEEP  0  . ibandronate (BONIVA) 150 MG tablet     . losartan (COZAAR) 25 MG tablet     . pantoprazole (PROTONIX) 40 MG tablet TAKE 1 TABLET BY MOUTH TWO  TIMES DAILY 180 tablet 1  . PROLIA 60 MG/ML SOLN injection Inject 60 mg into the skin every 6 (six) months.     . venlafaxine XR (EFFEXOR-XR) 75 MG 24 hr capsule TAKE 1 CAPSULE BY MOUTH  DAILY WITH BREAKFAST 90 capsule 3  . warfarin (COUMADIN) 2 MG tablet TAKE 1 TABLET BY MOUTH TWICE DAILY 60 tablet 0  . albuterol (PROAIR HFA) 108 (90 Base) MCG/ACT inhaler     . buPROPion (WELLBUTRIN XL) 150 MG 24 hr tablet Take  by mouth.    . clobetasol cream (TEMOVATE) 2.70 % Apply 1 application topically 2 (two) times daily. (Patient not taking: Reported on 08/15/2018) 30 g 0  . hydrocortisone cream 1 % Apply 1 application topically 2 (two) times daily. To right ankle (Patient not taking: Reported on 08/15/2018) 30 g 0  . isosorbide mononitrate (IMDUR) 30 MG 24 hr tablet Take by mouth.    . metoprolol tartrate (LOPRESSOR) 25 MG tablet Take 0.5 tablets (12.5 mg total) by mouth 2 (two) times daily. 90 tablet 3  . nitroGLYCERIN (NITROSTAT) 0.4 MG SL tablet Place 1 tablet (0.4 mg total) under the tongue every 5 (five) minutes as needed for chest pain. (Patient not taking: Reported on 08/15/2018) 25 tablet 6  . polyethylene glycol (GOLYTELY) 236 g solution Drink one 8 oz glass every 20 mins until stools are clear starting at 5:00pm on 11/04/17 (Patient not taking: Reported on 08/15/2018) 4000 mL 0   No current facility-administered medications for this visit.     Allergies as of 08/15/2018 - Review Complete 07/08/2018  Allergen Reaction Noted  . Amoxicillin Other (See Comments) 03/09/2015  . Vicodin [hydrocodone-acetaminophen] Hives and Rash 12/18/2014    ROS:  General: Negative for anorexia, weight loss,  fever, chills, fatigue, weakness. ENT: Negative for hoarseness, difficulty swallowing , nasal congestion. CV: Negative for chest pain, angina, palpitations, dyspnea on exertion, peripheral edema.  Respiratory: Negative for dyspnea at rest, dyspnea on exertion, cough, sputum, wheezing.  GI: See history of present illness. GU:  Negative for dysuria, hematuria, urinary incontinence, urinary frequency, nocturnal urination.  Endo: Negative for unusual weight change.    Physical Examination:   BP 107/67   Pulse (!) 116   Ht 5\' 3"  (1.6 m)   Wt 142 lb (64.4 kg)   BMI 25.15 kg/m   General: Well-nourished, well-developed in no acute distress.  Eyes: No icterus. Conjunctivae pink. Mouth: Oropharyngeal mucosa moist  and pink , no lesions erythema or exudate. Lungs: Clear to auscultation bilaterally. Non-labored. Heart: Regular rate and rhythm, no murmurs rubs or gallops.  Abdomen: Bowel sounds are normal, positive epigastric tenderness, nondistended, no hepatosplenomegaly or masses, no abdominal bruits or hernia , no rebound or guarding.   Extremities: No lower extremity edema. No clubbing or deformities. Neuro: Alert and oriented x 3.  Grossly intact. Skin: Warm and dry, no jaundice.   Psych: Alert and cooperative, normal mood and affect.  Labs:    Imaging Studies: No results found.  Assessment and Plan:   Sara [name] is a 67 y.o. y/o female with a history of reflux and an irregular Z line who had been on Protonix twice a day with good relief of her symptoms but went to once a day approximately 1 year ago.  She states that her symptoms started 2 months ago with increased epigastric pain and GERD.  The patient has been given the option of taking the Protonix in the evening versus going to the Protonix twice a day again versus starting on a different PPI.  The patient stated she wanted to start a different PPI.  The patient will be given samples of Dexilant to be taken in the evening.  If this helps her symptoms she will be given a prescription upon contacting our office.  The patient has been explained the plan and agrees with it.    Lucilla Lame, MD. Marval Regal   Note: This dictation was prepared with Dragon dictation along with smaller phrase technology. Any transcriptional errors that result from this process are unintentional.

## 2018-08-19 ENCOUNTER — Other Ambulatory Visit (INDEPENDENT_AMBULATORY_CARE_PROVIDER_SITE_OTHER): Payer: Self-pay | Admitting: Nurse Practitioner

## 2018-08-19 ENCOUNTER — Other Ambulatory Visit
Admission: RE | Admit: 2018-08-19 | Discharge: 2018-08-19 | Disposition: A | Discharge status: Home / Self Care | Payer: Medicare Other | Attending: Nurse Practitioner | Admitting: Nurse Practitioner

## 2018-08-19 DIAGNOSIS — I779 Disorder of arteries and arterioles, unspecified: Secondary | ICD-10-CM

## 2018-08-19 DIAGNOSIS — I739 Peripheral vascular disease, unspecified: Secondary | ICD-10-CM | POA: Diagnosis present

## 2018-08-19 LAB — PROTIME-INR
INR: 1.86
Prothrombin Time: 21.2 seconds — ABNORMAL HIGH (ref 11.4–15.2)

## 2018-08-19 NOTE — Progress Notes (Signed)
Starting her INR, 1.86, I think that the prednisone has worked its way out of your system.I think now would be a good time to go back to the current regimen that we had that seem to be working for you.6 mg on Monday and Tuesday (3 tablets)4 mg on all other days (2 tablets)We will get your INR again in 2 weeks.

## 2018-08-19 NOTE — Addendum Note (Signed)
Addended by: Santiago Bur on: 08/19/2018 10:04 AM   Modules accepted: Orders

## 2018-08-20 ENCOUNTER — Telehealth (INDEPENDENT_AMBULATORY_CARE_PROVIDER_SITE_OTHER): Payer: Self-pay

## 2018-08-20 NOTE — Telephone Encounter (Signed)
Called the patient and gave her Eulogio Ditch NP recomme.ndations. See note below.    Starting her INR, 1.86, I think that the prednisone has worked its way out of your system.I think now would be a good time to go back to the current regimen that we had that seem to be working for you.6 mg on Monday and Tuesday (3 tablets)4 mg on all other days (2 tablets)We will get your INR again in 2 weeks

## 2018-08-23 ENCOUNTER — Other Ambulatory Visit: Payer: Self-pay

## 2018-08-23 ENCOUNTER — Telehealth: Payer: Self-pay | Admitting: Gastroenterology

## 2018-08-23 MED ORDER — OMEPRAZOLE 40 MG PO CPDR: 40.0000 mg | DELAYED_RELEASE_CAPSULE | Freq: Every day | ORAL | 0 refills | Status: DC

## 2018-08-23 NOTE — Telephone Encounter (Signed)
See message below. I checked her insurance and Omeprazole 40mg  is covered under the pt's plan. Please okay rx.

## 2018-08-23 NOTE — Telephone Encounter (Signed)
That is good.  Let her take omeprazole.

## 2018-08-23 NOTE — Telephone Encounter (Signed)
Patient called in stating she is unable to afford the [place] it was going to be $588.00 for 90 day supply.She would like something else be prescribed but let her check with her insurance before the script is written.She states the PantopraZOLE 40 MG is no longer working. She uses [facility] [address] in [place]

## 2018-08-30 ENCOUNTER — Other Ambulatory Visit
Admission: RE | Admit: 2018-08-30 | Discharge: 2018-08-30 | Disposition: A | Discharge status: Home / Self Care | Payer: Medicare Other | Source: Ambulatory Visit | Attending: Nurse Practitioner | Admitting: Nurse Practitioner

## 2018-08-30 DIAGNOSIS — I779 Disorder of arteries and arterioles, unspecified: Secondary | ICD-10-CM | POA: Diagnosis present

## 2018-08-30 LAB — PROTIME-INR
INR: 2.31
Prothrombin Time: 25.1 seconds — ABNORMAL HIGH (ref 11.4–15.2)

## 2018-08-30 NOTE — Progress Notes (Signed)
INR 2.31, which is right where we want it to be.   We will continue with the current schedule:  6 mg on Monday and Tuesday (3 tablets) 4 mg on all other days (2 tablets)  We will get your INR again in 2 weeks.If the next INR is in range we will start getting it every 4 weeks.

## 2018-08-30 NOTE — Addendum Note (Signed)
Addended by: Santiago Bur on: 08/30/2018 11:01 AM   Modules accepted: Orders

## 2018-09-04 ENCOUNTER — Ambulatory Visit (INDEPENDENT_AMBULATORY_CARE_PROVIDER_SITE_OTHER): Payer: Medicare Other

## 2018-09-04 ENCOUNTER — Ambulatory Visit (INDEPENDENT_AMBULATORY_CARE_PROVIDER_SITE_OTHER): Payer: Medicare Other | Admitting: Nurse Practitioner

## 2018-09-04 ENCOUNTER — Other Ambulatory Visit (INDEPENDENT_AMBULATORY_CARE_PROVIDER_SITE_OTHER): Payer: Self-pay | Admitting: Nurse Practitioner

## 2018-09-04 DIAGNOSIS — M79606 Pain in leg, unspecified: Secondary | ICD-10-CM | POA: Diagnosis not present

## 2018-09-06 ENCOUNTER — Encounter (INDEPENDENT_AMBULATORY_CARE_PROVIDER_SITE_OTHER): Payer: Self-pay | Admitting: Vascular Surgery

## 2018-09-13 ENCOUNTER — Ambulatory Visit
Admission: RE | Admit: 2018-09-13 | Discharge: 2018-09-13 | Disposition: A | Discharge status: Home / Self Care | Payer: Medicare Other | Source: Ambulatory Visit | Attending: Nurse Practitioner | Admitting: Nurse Practitioner

## 2018-09-13 ENCOUNTER — Other Ambulatory Visit
Admission: RE | Admit: 2018-09-13 | Discharge: 2018-09-13 | Disposition: A | Discharge status: Home / Self Care | Payer: Medicare Other | Source: Ambulatory Visit | Attending: Vascular Surgery | Admitting: Vascular Surgery

## 2018-09-13 DIAGNOSIS — I779 Disorder of arteries and arterioles, unspecified: Secondary | ICD-10-CM

## 2018-09-13 DIAGNOSIS — Z1231 Encounter for screening mammogram for malignant neoplasm of breast: Secondary | ICD-10-CM | POA: Insufficient documentation

## 2018-09-13 LAB — PROTIME-INR
INR: 2.64
Prothrombin Time: 27.8 seconds — ABNORMAL HIGH (ref 11.4–15.2)

## 2018-09-13 NOTE — Progress Notes (Signed)
Please call Sara [name] and let her know that her INR is 2.64.She should continue her regimen as she has been doing.  With 3 tablets (6 mg) on Monday and Tuesday and 2 tablets (4 mg) every other day.  Because we have gotten her INR was in range once again, we will transition to getting blood work every 4 weeks.

## 2018-09-16 NOTE — Telephone Encounter (Signed)
Needs 90 day supply RX for Omeprazol 40 mg called into [facility]

## 2018-09-17 ENCOUNTER — Other Ambulatory Visit: Payer: Self-pay

## 2018-09-17 MED ORDER — OMEPRAZOLE 40 MG PO CPDR: 40.0000 mg | DELAYED_RELEASE_CAPSULE | Freq: Every day | ORAL | 1 refills | Status: DC

## 2018-09-17 NOTE — Telephone Encounter (Signed)
Rx for Omeprazole 40mg  has been sent to [facility].

## 2018-09-30 ENCOUNTER — Other Ambulatory Visit: Payer: Self-pay

## 2018-09-30 ENCOUNTER — Telehealth: Payer: Self-pay | Admitting: Gastroenterology

## 2018-09-30 MED ORDER — OMEPRAZOLE 40 MG PO CPDR: 40.0000 mg | DELAYED_RELEASE_CAPSULE | Freq: Every day | ORAL | 0 refills | Status: DC

## 2018-09-30 MED ORDER — OMEPRAZOLE 40 MG PO CPDR: 40.0000 mg | DELAYED_RELEASE_CAPSULE | Freq: Every day | ORAL | 3 refills | Status: DC

## 2018-09-30 NOTE — Telephone Encounter (Signed)
Patient called & needs 10 pills called  to the [facility] in [place] on [address] to last her until another prescription can be called into [facility] fo 90 days for omeprazole (PRILOSEC) 40 MG capsule.

## 2018-09-30 NOTE — Telephone Encounter (Signed)
Rx for Omeprazole 40mg  has been sent to [facility] per pt request. 30 days only. 90 day supply sent to pt's mail order pharmacy.

## 2018-10-10 ENCOUNTER — Other Ambulatory Visit
Admission: RE | Admit: 2018-10-10 | Discharge: 2018-10-10 | Disposition: A | Discharge status: Home / Self Care | Payer: Medicare Other | Source: Ambulatory Visit | Attending: Nurse Practitioner | Admitting: Nurse Practitioner

## 2018-10-10 ENCOUNTER — Telehealth (INDEPENDENT_AMBULATORY_CARE_PROVIDER_SITE_OTHER): Payer: Self-pay | Admitting: Nurse Practitioner

## 2018-10-10 DIAGNOSIS — I779 Disorder of arteries and arterioles, unspecified: Secondary | ICD-10-CM | POA: Diagnosis present

## 2018-10-10 LAB — PROTIME-INR
INR: 2.99
Prothrombin Time: 30.6 seconds — ABNORMAL HIGH (ref 11.4–15.2)

## 2018-10-10 NOTE — Telephone Encounter (Signed)
Patient was informed with medical advice 

## 2018-10-21 ENCOUNTER — Other Ambulatory Visit (INDEPENDENT_AMBULATORY_CARE_PROVIDER_SITE_OTHER): Payer: Self-pay | Admitting: Nurse Practitioner

## 2018-11-12 LAB — POCT INR

## 2018-11-13 ENCOUNTER — Telehealth (INDEPENDENT_AMBULATORY_CARE_PROVIDER_SITE_OTHER): Payer: Self-pay | Admitting: Nurse Practitioner

## 2018-11-13 ENCOUNTER — Other Ambulatory Visit (INDEPENDENT_AMBULATORY_CARE_PROVIDER_SITE_OTHER): Payer: Self-pay | Admitting: Vascular Surgery

## 2018-11-13 DIAGNOSIS — Z7901 Long term (current) use of anticoagulants: Secondary | ICD-10-CM

## 2018-11-13 MED ORDER — WARFARIN SODIUM 2 MG PO TABS: 2.0000 mg | ORAL_TABLET | Freq: Two times a day (BID) | ORAL | 0 refills | Status: DC

## 2018-11-13 NOTE — Telephone Encounter (Signed)
Patient is aware of the below. She said there has been no changes in her medications and that she has already taken dose today but will hold tomorrow. She verbalized understanding on dosage below. Stated she had been given a machine at home to check INR and would be using that. AS< CMA

## 2018-11-13 NOTE — Telephone Encounter (Signed)
Requesting refill for blood thinner Warfarin to be sent to [facility].

## 2018-11-14 ENCOUNTER — Encounter (INDEPENDENT_AMBULATORY_CARE_PROVIDER_SITE_OTHER): Payer: Self-pay

## 2018-11-25 LAB — POCT INR

## 2018-12-02 ENCOUNTER — Other Ambulatory Visit (INDEPENDENT_AMBULATORY_CARE_PROVIDER_SITE_OTHER): Payer: Self-pay | Admitting: Vascular Surgery

## 2018-12-02 DIAGNOSIS — Z7901 Long term (current) use of anticoagulants: Secondary | ICD-10-CM

## 2018-12-02 LAB — POCT INR

## 2018-12-03 ENCOUNTER — Ambulatory Visit (INDEPENDENT_AMBULATORY_CARE_PROVIDER_SITE_OTHER): Payer: 59 | Admitting: Nurse Practitioner

## 2018-12-05 NOTE — Progress Notes (Signed)
encounter opened in error

## 2018-12-09 LAB — POCT INR

## 2018-12-10 ENCOUNTER — Encounter (INDEPENDENT_AMBULATORY_CARE_PROVIDER_SITE_OTHER): Payer: Self-pay

## 2018-12-10 LAB — POCT INR

## 2018-12-16 LAB — POCT INR

## 2018-12-19 ENCOUNTER — Encounter (INDEPENDENT_AMBULATORY_CARE_PROVIDER_SITE_OTHER): Payer: Self-pay

## 2018-12-23 LAB — POCT INR

## 2018-12-24 ENCOUNTER — Encounter (INDEPENDENT_AMBULATORY_CARE_PROVIDER_SITE_OTHER): Payer: Self-pay

## 2018-12-28 ENCOUNTER — Other Ambulatory Visit (INDEPENDENT_AMBULATORY_CARE_PROVIDER_SITE_OTHER): Payer: Self-pay | Admitting: Vascular Surgery

## 2018-12-28 DIAGNOSIS — Z7901 Long term (current) use of anticoagulants: Secondary | ICD-10-CM

## 2018-12-30 ENCOUNTER — Telehealth (INDEPENDENT_AMBULATORY_CARE_PROVIDER_SITE_OTHER): Payer: Self-pay

## 2018-12-30 ENCOUNTER — Encounter (INDEPENDENT_AMBULATORY_CARE_PROVIDER_SITE_OTHER): Payer: Self-pay

## 2018-12-30 LAB — POCT INR

## 2018-12-30 NOTE — Telephone Encounter (Signed)
INR results is 3.5 patient has taken 3 tablets as prescribe for today but medication adjustment will be to skip tomorrow and start taking 1 tablet until next inr check order by Arna Medici NP

## 2019-01-20 ENCOUNTER — Telehealth (INDEPENDENT_AMBULATORY_CARE_PROVIDER_SITE_OTHER): Payer: Self-pay

## 2019-01-20 NOTE — Telephone Encounter (Signed)
I left a message with medication adjustments and to recheck INR in 2 weeks from [place] NP

## 2019-01-20 NOTE — Telephone Encounter (Signed)
Let's increase slowly and have her resume taking two tablets on mon and Tuesday (if she has already taken today, lets do Tuesday and Wednesday)

## 2019-01-21 ENCOUNTER — Telehealth (INDEPENDENT_AMBULATORY_CARE_PROVIDER_SITE_OTHER): Payer: Self-pay

## 2019-01-21 NOTE — Telephone Encounter (Signed)
Patient has been made aware with Arna Medici NP medical advice and verbal understand but she stated that she was instructed to check weekly by MD INR

## 2019-01-21 NOTE — Telephone Encounter (Signed)
The patient should only be taking one tablet a day for the rest of the week.  The patient should adhere to the instructions given to her by the medical provider regarding dosage for her medication as well as when to check her INR.  If she continues to adjust her medications without provider instruction as well as disregard when advised to check her INR, we will be required to remove the in-home INR meter and return to having hospital labs drawn.    To reiterate:   The patient should take one pill per day until 01/27/2019.  On 01/27/2019 AND 01/28/2019 she should take 2 tablets on these days.  The remaining days of that week she should take one tablet.  She should re-check results on 02/03/2019.

## 2019-01-27 ENCOUNTER — Telehealth (INDEPENDENT_AMBULATORY_CARE_PROVIDER_SITE_OTHER): Payer: Self-pay | Admitting: Nurse Practitioner

## 2019-01-27 NOTE — Telephone Encounter (Signed)
Yes she should continue the same dose at this time.

## 2019-01-27 NOTE — Telephone Encounter (Signed)
Left message on mobile number with medication advise and if patient has questions to return call. AS, CMA

## 2019-01-27 NOTE — Telephone Encounter (Signed)
Patient called stating her INR was 2.3. Asking if she is to continue same dose of coumadin? Please advise. AS, CMA

## 2019-02-03 ENCOUNTER — Telehealth (INDEPENDENT_AMBULATORY_CARE_PROVIDER_SITE_OTHER): Payer: Self-pay

## 2019-02-03 NOTE — Telephone Encounter (Signed)
A message was left on patient voicemail with medical instructions

## 2019-02-03 NOTE — Telephone Encounter (Signed)
No changes needed at this time to her medication.  She is just right.

## 2019-02-10 ENCOUNTER — Telehealth (INDEPENDENT_AMBULATORY_CARE_PROVIDER_SITE_OTHER): Payer: Self-pay | Admitting: Nurse Practitioner

## 2019-02-10 NOTE — Telephone Encounter (Signed)
Please find out exactly what the patient has been taking this last week.  I would like to the know the dosage on each day as well as if she's started new medication

## 2019-02-10 NOTE — Telephone Encounter (Signed)
Patient INR is 3.5. Please advise dosage of coumadin. AS, CMA

## 2019-02-10 NOTE — Telephone Encounter (Signed)
Patients coumadin tabs are 2 mg each.  She took 3 tablets (6mg ) Monday and Tuesday and 1 tablet (2mg ) Wednesday through Sunday.   Patient has started 2 new medications this past week: Diflucan 150mg -took 1 tablet; Zaleplon 10mg  1 tablet at bedtime.

## 2019-02-10 NOTE — Telephone Encounter (Signed)
Patient has been made aware of the below and instructed to hold coumadin tomorrow (shes already taken it today) and then 1 tab Wednesday- Sunday and recheck on Monday. She verbalized understanding. AS, CMA

## 2019-02-10 NOTE — Telephone Encounter (Signed)
Diflucan can cause an elevation in INR levels.  Let's hold her dose of coumadin today (If she has already taken in, hold Tuesday). If she is taking a dose on Tuesday, she should take 2 tablets. She should take one tablet the rest of the week and recheck on Monday.

## 2019-02-17 ENCOUNTER — Telehealth (INDEPENDENT_AMBULATORY_CARE_PROVIDER_SITE_OTHER): Payer: Self-pay

## 2019-02-17 NOTE — Telephone Encounter (Signed)
Patient left a voicemail about INR results 2.7 and per Arna Medici NP there are no changes to medication and patient will recheck as schedule.Patient has been informed with information

## 2019-02-20 ENCOUNTER — Other Ambulatory Visit (INDEPENDENT_AMBULATORY_CARE_PROVIDER_SITE_OTHER): Payer: Self-pay | Admitting: Nurse Practitioner

## 2019-02-20 ENCOUNTER — Other Ambulatory Visit (INDEPENDENT_AMBULATORY_CARE_PROVIDER_SITE_OTHER): Payer: Self-pay | Admitting: Vascular Surgery

## 2019-02-20 ENCOUNTER — Other Ambulatory Visit (INDEPENDENT_AMBULATORY_CARE_PROVIDER_SITE_OTHER): Payer: Self-pay

## 2019-02-20 MED ORDER — CLOPIDOGREL BISULFATE 75 MG PO TABS: 75.0000 mg | ORAL_TABLET | Freq: Every day | ORAL | 3 refills | Status: DC

## 2019-02-24 ENCOUNTER — Telehealth (INDEPENDENT_AMBULATORY_CARE_PROVIDER_SITE_OTHER): Payer: Self-pay | Admitting: Nurse Practitioner

## 2019-02-24 NOTE — Telephone Encounter (Signed)
Patient report INR is 1.7-please advise. AS, CMA

## 2019-02-24 NOTE — Telephone Encounter (Signed)
Patient verbalized understanding. Asked if Magnesium would affect the level because shes been taking it. Per Arna Medici- no, its ok to take magnesium.  Patient also stated that she was supposed to start getting steroid injections in her shoulder and pain management program she is in told patient she would need to d/c anticoagulant for a few days. I advised patient to have clinic send over medical clearance for that and we would have doc fill that out and send it back. Patient verbalized understanding. AS, CMA

## 2019-02-24 NOTE — Telephone Encounter (Signed)
Let's have her return to 3 tablets on today, 2 tablets tomorrow and 1 tablet daily for the rest of the week.

## 2019-02-26 ENCOUNTER — Encounter (INDEPENDENT_AMBULATORY_CARE_PROVIDER_SITE_OTHER): Payer: Self-pay

## 2019-02-26 LAB — POCT INR

## 2019-03-03 ENCOUNTER — Encounter (INDEPENDENT_AMBULATORY_CARE_PROVIDER_SITE_OTHER): Payer: Self-pay

## 2019-03-03 ENCOUNTER — Telehealth (INDEPENDENT_AMBULATORY_CARE_PROVIDER_SITE_OTHER): Payer: Self-pay

## 2019-03-03 LAB — POCT INR

## 2019-03-03 NOTE — Telephone Encounter (Signed)
I think the diflucan is out of her system (These medicines can linger for a while), let's try doing two tablets (4 mg) Mon thru Friday and just one tablet on the weekend days.  Re-check Monday

## 2019-03-03 NOTE — Telephone Encounter (Signed)
Patient has been made aware with information and verbalized understanding

## 2019-03-10 ENCOUNTER — Telehealth (INDEPENDENT_AMBULATORY_CARE_PROVIDER_SITE_OTHER): Payer: Self-pay

## 2019-03-10 NOTE — Telephone Encounter (Signed)
Patient called stating INR is 2.6 and I spoke with Sara Ditch NP and she advise for no changes in dosage recheck on Monday.Patient has been aware with information and verbalize understanding

## 2019-03-11 ENCOUNTER — Other Ambulatory Visit (INDEPENDENT_AMBULATORY_CARE_PROVIDER_SITE_OTHER): Payer: Self-pay | Admitting: Vascular Surgery

## 2019-03-11 DIAGNOSIS — Z7901 Long term (current) use of anticoagulants: Secondary | ICD-10-CM

## 2019-03-17 ENCOUNTER — Telehealth (INDEPENDENT_AMBULATORY_CARE_PROVIDER_SITE_OTHER): Payer: Self-pay

## 2019-03-17 NOTE — Telephone Encounter (Signed)
Patient INR 1.4 and I spoke with Eulogio Ditch NP she wanted to know was there any changes and the patient informed that she ate 2 small servings of cabbage and Arna Medici was given information.I  inform patient new dosage instructions are to take 3 tablets Monday and Tuesday,2 tablets Wednesday-Friday,Saturday-Sunday take 1 tablet recheck in a week

## 2019-03-20 ENCOUNTER — Encounter (INDEPENDENT_AMBULATORY_CARE_PROVIDER_SITE_OTHER): Payer: Self-pay

## 2019-03-20 LAB — POCT INR

## 2019-03-24 ENCOUNTER — Telehealth (INDEPENDENT_AMBULATORY_CARE_PROVIDER_SITE_OTHER): Payer: Self-pay

## 2019-03-24 NOTE — Telephone Encounter (Signed)
Patient has been informed and verbalize understanding

## 2019-03-24 NOTE — Telephone Encounter (Signed)
That is good.Marland KitchenMarland KitchenShe should continue with current dosage

## 2019-03-31 ENCOUNTER — Telehealth (INDEPENDENT_AMBULATORY_CARE_PROVIDER_SITE_OTHER): Payer: Self-pay | Admitting: Nurse Practitioner

## 2019-03-31 ENCOUNTER — Other Ambulatory Visit (INDEPENDENT_AMBULATORY_CARE_PROVIDER_SITE_OTHER): Payer: Self-pay | Admitting: Vascular Surgery

## 2019-03-31 DIAGNOSIS — Z7901 Long term (current) use of anticoagulants: Secondary | ICD-10-CM

## 2019-03-31 NOTE — Telephone Encounter (Signed)
Patient INR 2.8. Please advise coumadin dose. AS, CMA

## 2019-03-31 NOTE — Telephone Encounter (Signed)
She is within range, she should continue with current dosage

## 2019-03-31 NOTE — Telephone Encounter (Signed)
Patient made aware of the below and verbalized understanding. AS, CMA

## 2019-04-02 ENCOUNTER — Encounter (INDEPENDENT_AMBULATORY_CARE_PROVIDER_SITE_OTHER): Payer: Self-pay

## 2019-04-02 LAB — POCT INR

## 2019-04-07 ENCOUNTER — Telehealth (INDEPENDENT_AMBULATORY_CARE_PROVIDER_SITE_OTHER): Payer: Self-pay | Admitting: Nurse Practitioner

## 2019-04-07 NOTE — Telephone Encounter (Signed)
Patient is aware of the below. AS, CMA 

## 2019-04-07 NOTE — Telephone Encounter (Signed)
INR is in range. Continue current dosage.

## 2019-04-07 NOTE — Telephone Encounter (Signed)
Patient reports INR 2.3. Please advise dosing. AS, CMA

## 2019-04-09 ENCOUNTER — Encounter (INDEPENDENT_AMBULATORY_CARE_PROVIDER_SITE_OTHER): Payer: Self-pay

## 2019-04-09 LAB — POCT INR

## 2019-04-14 ENCOUNTER — Telehealth (INDEPENDENT_AMBULATORY_CARE_PROVIDER_SITE_OTHER): Payer: Self-pay

## 2019-04-14 NOTE — Telephone Encounter (Signed)
Since she just started prednisone, let's not make any changes.  Prednisone actually has a tendency to raise coumadin levels.  In fact, let's check her INR level on Friday to ensure it hasn't gone too high

## 2019-04-14 NOTE — Telephone Encounter (Signed)
Patient has been made aware with medical advice and verbalize understanding 

## 2019-04-16 ENCOUNTER — Encounter (INDEPENDENT_AMBULATORY_CARE_PROVIDER_SITE_OTHER): Payer: Self-pay

## 2019-04-16 LAB — POCT INR

## 2019-04-18 ENCOUNTER — Telehealth (INDEPENDENT_AMBULATORY_CARE_PROVIDER_SITE_OTHER): Payer: Self-pay | Admitting: Nurse Practitioner

## 2019-04-18 NOTE — Telephone Encounter (Signed)
That is right in range.  Continue taking normal dose.  Find out when she will stop prednisone.  Recheck on monday

## 2019-04-18 NOTE — Telephone Encounter (Signed)
Patients INR is 2.5 today. Please advise. AS< CMA

## 2019-04-18 NOTE — Telephone Encounter (Signed)
She took the last pill Wednesday. Patient has been advised to continue normal dose and to recheck Monday. AS, CMA

## 2019-04-20 ENCOUNTER — Other Ambulatory Visit (INDEPENDENT_AMBULATORY_CARE_PROVIDER_SITE_OTHER): Payer: Self-pay | Admitting: Vascular Surgery

## 2019-04-20 DIAGNOSIS — Z7901 Long term (current) use of anticoagulants: Secondary | ICD-10-CM

## 2019-04-21 ENCOUNTER — Telehealth (INDEPENDENT_AMBULATORY_CARE_PROVIDER_SITE_OTHER): Payer: Self-pay | Admitting: Nurse Practitioner

## 2019-04-21 NOTE — Telephone Encounter (Signed)
Left message for patient to call back. AS< CMA 

## 2019-04-21 NOTE — Telephone Encounter (Signed)
Patient takes 3 Monday and Tuesday, 2 Wednesday-Friday and 1 Saturday and Sunday.   Patient finished her Prednisone on Wednesday and is taking Tramadol 4 x daily for pain.   Patient has been advised of the below and verbalized understanding. AS, CMA

## 2019-04-21 NOTE — Telephone Encounter (Signed)
Can we confirm what she is taking? The last I have noted is 3 tablets Monday and Tuesday,2 tablets Wednesday-Friday,Saturday-Sunday take 1 tablet recheck in a week. If that is correct, lets have her add add one tablet on Wednesday to recheck Friday

## 2019-04-21 NOTE — Telephone Encounter (Signed)
Patient INR is 1.6. Please advise. AS, CMA

## 2019-04-25 ENCOUNTER — Telehealth (INDEPENDENT_AMBULATORY_CARE_PROVIDER_SITE_OTHER): Payer: Self-pay

## 2019-04-25 NOTE — Telephone Encounter (Signed)
Let's continue to take 3 Monday and Tuesday, 2 Wednesday-Friday and 1 Saturday and Sunday. Let's recheck Friday

## 2019-04-25 NOTE — Telephone Encounter (Signed)
I left a detail message on the patient voicemail with dosage instructions

## 2019-05-02 ENCOUNTER — Telehealth (INDEPENDENT_AMBULATORY_CARE_PROVIDER_SITE_OTHER): Payer: Self-pay

## 2019-05-02 NOTE — Telephone Encounter (Signed)
Patient INR 3.6 and per Eulogio Ditch NP she advise for the patient to hold coumadin medication this weekend and restart taking 2tablets Monday-Friday and Saturday -Sunday take 1 tablet.The patient should recheck INR on Friday. Patient has been made aware with medical advice and verbalize understanding.

## 2019-05-06 ENCOUNTER — Encounter (INDEPENDENT_AMBULATORY_CARE_PROVIDER_SITE_OTHER): Payer: Self-pay

## 2019-05-06 LAB — POCT INR

## 2019-05-09 ENCOUNTER — Telehealth (INDEPENDENT_AMBULATORY_CARE_PROVIDER_SITE_OTHER): Payer: Self-pay | Admitting: Nurse Practitioner

## 2019-05-09 NOTE — Telephone Encounter (Signed)
If she hasn't taken her dose today, have her to hold it.  If she has, have her hold sat/sun and recheck friday

## 2019-05-09 NOTE — Telephone Encounter (Signed)
Patient INR is 3.2. Please advise if there are any changes to be made with medication. AS, CMA

## 2019-05-09 NOTE — Telephone Encounter (Signed)
Patient has been made aware of this and verbalized understanding. AS, CMA

## 2019-05-14 ENCOUNTER — Other Ambulatory Visit (INDEPENDENT_AMBULATORY_CARE_PROVIDER_SITE_OTHER): Payer: Self-pay | Admitting: Nurse Practitioner

## 2019-05-14 DIAGNOSIS — Z7901 Long term (current) use of anticoagulants: Secondary | ICD-10-CM

## 2019-05-16 ENCOUNTER — Telehealth (INDEPENDENT_AMBULATORY_CARE_PROVIDER_SITE_OTHER): Payer: Self-pay | Admitting: Nurse Practitioner

## 2019-05-16 ENCOUNTER — Encounter (INDEPENDENT_AMBULATORY_CARE_PROVIDER_SITE_OTHER): Payer: Self-pay

## 2019-05-16 LAB — POCT INR

## 2019-05-16 NOTE — Telephone Encounter (Signed)
We are finally in range Sara [name]!). Let's move forward with continuing to take 1 tab  on Sat/ Sun, 3 tab on Monday, 2 tab Tuesday -Friday.  We will recheck Friday.

## 2019-05-16 NOTE — Telephone Encounter (Signed)
Sara [name] callling to report INR is 2.1. Please advise. AS, CMA

## 2019-05-16 NOTE — Telephone Encounter (Signed)
Left message on patient voicemail (as instructed per patient) with medication dosage directions. AS, CMA

## 2019-05-23 ENCOUNTER — Telehealth (INDEPENDENT_AMBULATORY_CARE_PROVIDER_SITE_OTHER): Payer: Self-pay

## 2019-05-23 NOTE — Telephone Encounter (Signed)
She should hold sat/sun dose....and continue with 2 tab Mon-Fri, we'll recheck friday

## 2019-05-23 NOTE — Telephone Encounter (Signed)
Patient has been made aware with medical advice from Eulogio Ditch NP and verbalize understanding

## 2019-05-27 ENCOUNTER — Other Ambulatory Visit: Payer: Self-pay

## 2019-05-27 ENCOUNTER — Emergency Department
Admission: EM | Admit: 2019-05-27 | Discharge: 2019-05-27 | Disposition: A | Discharge status: Home / Self Care | Payer: Medicare Other | Attending: Student in an Organized Health Care Education/Training Program | Admitting: Student in an Organized Health Care Education/Training Program

## 2019-05-27 ENCOUNTER — Encounter: Payer: Self-pay | Admitting: Intensive Care

## 2019-05-27 ENCOUNTER — Emergency Department: Payer: Medicare Other

## 2019-05-27 DIAGNOSIS — M79604 Pain in right leg: Secondary | ICD-10-CM | POA: Insufficient documentation

## 2019-05-27 DIAGNOSIS — Z7902 Long term (current) use of antithrombotics/antiplatelets: Secondary | ICD-10-CM | POA: Diagnosis not present

## 2019-05-27 DIAGNOSIS — Z79899 Other long term (current) drug therapy: Secondary | ICD-10-CM | POA: Diagnosis not present

## 2019-05-27 DIAGNOSIS — Z7901 Long term (current) use of anticoagulants: Secondary | ICD-10-CM | POA: Insufficient documentation

## 2019-05-27 LAB — PROTIME-INR
INR: 1.2 (ref 0.8–1.2)
Prothrombin Time: 15.5 seconds — ABNORMAL HIGH (ref 11.4–15.2)

## 2019-05-27 MED ORDER — WARFARIN SODIUM 2 MG PO TABS
2.0000 mg | ORAL_TABLET | Freq: Once | ORAL | Status: AC
  Administered 2019-05-27: 2 mg via ORAL
  Filled 2019-05-27: qty 1

## 2019-05-27 MED ORDER — WARFARIN - PHARMACIST DOSING INPATIENT
Freq: Every day | Status: DC
  Filled 2019-05-27: qty 1

## 2019-05-27 MED ORDER — WARFARIN SODIUM 5 MG PO TABS
5.0000 mg | ORAL_TABLET | Freq: Once | ORAL | Status: DC
  Filled 2019-05-27: qty 1

## 2019-05-27 MED ORDER — WARFARIN - PHYSICIAN DOSING INPATIENT
Freq: Every day | Status: DC
  Filled 2019-05-27: qty 1

## 2019-05-27 NOTE — Discharge Instructions (Signed)
Please follow-up in vascular surgery clinic for Coumadin level recheck as well as evaluation of your peripheral artery disease.  If you start having worsening pain particularly if you are having pain at rest please return immediately to the ER or seek medical attention.  As we discussed with pharmacy we will give you an extra dose of 2 mg of your Coumadin today and please continue taking 4 mg of your Coumadin for the rest of the week until further advised by your Coumadin clinic.

## 2019-05-27 NOTE — ED Triage Notes (Addendum)
Patient c/o right shin pain when ambulating that started this AM. Reports feels the same as when she had blood clot in 2017. Denies swelling. No redness. Takes warfarin daily. Rainbow drawn in triage

## 2019-05-27 NOTE — Consult Note (Signed)
ANTICOAGULATION CONSULT NOTE - Initial Consult  Pharmacy Consult for Warfarin Indication: Hx of DVT  Allergies  Allergen Reactions  . Amoxicillin Other (See Comments)    Yeast infection  . Vicodin [Hydrocodone-Acetaminophen] Hives and Rash    Patient Measurements: Height: 5\' 3"  (160 cm) Weight: 125 lb (56.7 kg) IBW/kg (Calculated) : 52.4  Vital Signs: Temp: 98.8 F (37.1 C) (10/06 1310) Temp Source: Oral (10/06 1310) BP: 129/71 (10/06 1310) Pulse Rate: 97 (10/06 1310)  Labs: Recent Labs    05/27/19 1319  LABPROT 15.5*  INR 1.2    CrCl cannot be calculated (Patient's most recent lab result is older than the maximum 21 days allowed.).   Medical History: Past Medical History:  Diagnosis Date  . Depression   . GERD (gastroesophageal reflux disease)   . History of blood clots   . Mild mitral regurgitation   . Osteoporosis   . Stomach ulcer   . Vascular disease    Sees Dr. Delana Meyer  . Vertigo    Last episode approx Aug 2015  . Wears dentures    full upper    Medications:  (Not in a hospital admission)  Scheduled:  Infusions:  PRN:  Anti-infectives (From admission, onward)   None      Assessment: Pharmacy was consulted to dose warfarin. Spoke with patient. Last Friday, she tested her INR at home which was 3.6. She was told to hold warfarin dose Saturday and Sunday and restart Monday at a dose was 4 mg. Patient took yesterday's dose and she took a dose today. She could not remember what her dose was the previous week. She stated one day he had to take 2 tablets and the next 3 tablets. INR is currently subtherapeutic.   Date INR Warfarin Dose  10/6 1.2  6 mg         Goal of Therapy:  INR 2-3 Monitor platelets by anticoagulation protocol: Yes   Plan:  INR is subtherapeutic. Patient received 4 mg this AM at home. Will give another warfarin 2 mg today for a total of 6 mg. Daily INR ordered. If discharged patient should continue 4 mg daily until Friday  and test INR again.   Oswald Hillock, PharmD, BCPS 05/27/2019,2:53 PM

## 2019-05-27 NOTE — ED Provider Notes (Signed)
[facility] Emergency Department Provider Note    First MD Initiated Contact with Patient 05/27/19 1320     (approximate)  I have reviewed the triage vital signs and the nursing notes.   HISTORY  Chief Complaint Leg Pain (right shin)    HPI Sara [name] is a 68 y.o. female presents the ER for evaluation of right anterior leg pain that started today.  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  Denies any fevers or redness.  No trauma.  States it feels somewhat similar to when she was previously diagnosed with DVT and was concerned about that and that is why she came to the ER today.  Denies any chest pain or shortness of breath.  No fevers.  No other concerns.    Past Medical History:  Diagnosis Date  . Depression   . GERD (gastroesophageal reflux disease)   . History of blood clots   . Mild mitral regurgitation   . Osteoporosis   . Stomach ulcer   . Vascular disease    Sees Dr. Delana Meyer  . Vertigo    Last episode approx Aug 2015  . Wears dentures    full upper   Family History  Problem Relation Age of Onset  . CVA Mother   . Heart attack Mother   . Cancer Father        colon cancer  . Colon cancer Father   . Heart disease Maternal Uncle   . Breast cancer Neg Hx    Past Surgical History:  Procedure Laterality Date  . BREAST CYST ASPIRATION Left   . CARDIAC CATHETERIZATION  02/15/2017   [facility]  . COLONOSCOPY WITH PROPOFOL N/A 10/22/2017   Procedure: COLONOSCOPY WITH PROPOFOL;  Surgeon: Lucilla Lame, MD;  Location: [facility];  Service: Endoscopy;  Laterality: N/A;  specimens not taken--pt on Plavix will be brought back in after 7 days off med  . COLONOSCOPY WITH PROPOFOL N/A 11/05/2017   Procedure: COLONOSCOPY WITH PROPOFOL;  Surgeon: Lucilla Lame, MD;  Location: [place];  Service: Endoscopy;  Laterality: N/A;  . ESOPHAGOGASTRODUODENOSCOPY N/A 12/21/2014   Procedure:  ESOPHAGOGASTRODUODENOSCOPY (EGD);  Surgeon: Lucilla Lame, MD;  Location: [place];  Service: Gastroenterology;  Laterality: N/A;  . ESOPHAGOGASTRODUODENOSCOPY (EGD) WITH PROPOFOL N/A 02/08/2016   Procedure: ESOPHAGOGASTRODUODENOSCOPY (EGD) WITH PROPOFOL;  Surgeon: Lucilla Lame, MD;  Location: [facility];  Service: Endoscopy;  Laterality: N/A;  . HEMORROIDECTOMY  2014  . PERIPHERAL VASCULAR CATHETERIZATION N/A 02/09/2016   Procedure: Abdominal Aortogram w/Lower Extremity;  Surgeon: [name], MD;  Location: [place] [facility];  Service: Cardiovascular;  Laterality: N/A;  . PERIPHERAL VASCULAR CATHETERIZATION Right 02/10/2016   Procedure: Lower Extremity Angiography;  Surgeon: [name], MD;  Location: [place] [facility];  Service: Cardiovascular;  Laterality: Right;  . POLYPECTOMY  11/05/2017   Procedure: POLYPECTOMY;  Surgeon: Lucilla Lame, MD;  Location: [place];  Service: Endoscopy;;  . TONSILECTOMY, ADENOIDECTOMY, BILATERAL MYRINGOTOMY AND TUBES  1968  . VASCULAR SURGERY  [id number]   Fem-Pop Bypass   Patient Active Problem List   Diagnosis Date Noted  . Eczema of lower extremity 03/04/2018  . Chronic venous insufficiency 03/02/2018  . Personal history of colonic polyps   . Family history of colonic polyps   . Benign neoplasm of ascending colon   . CAD (coronary artery disease) 12/21/2016  . Essential hypertension 12/21/2016  . Compression fracture of L3 lumbar  vertebra 04/20/2016  . PVD (peripheral vascular disease) ([place]) 02/24/2016  . Family history of premature CAD 02/24/2016  . Anticoagulated on warfarin 02/21/2016  . Gastritis   . Other specified diseases of esophagus   . Hiatal hernia   . Gastritis and gastroduodenitis   . Ischemia of lower extremity   . Arterial occlusion ([place])   . Atherosclerosis of aorta ([place])   . History of smoking   . Hyperlipidemia   . Pain in the chest   . 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 Carotid artery stenosis 01/04/2016  . Vertigo, benign paroxysmal 12/28/2015  . Clinical depression 09/06/2015  . History of alcoholism ([place]) 09/06/2015  . Angiopathy, 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[place]) 09/06/2015  . Peripheral vascular disease ([place]) 09/06/2015  . Acute non-recurrent maxillary sinusitis 07/14/2015  . Vaginal pruritus 05/26/2015  . Chronic recurrent major depressive disorder ([place]) 03/09/2015  . 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 Peripheral blood vessel disorder ([place]) 03/09/2015  . Acid reflux 03/09/2015  . GERD (gastroesophageal reflux disease) 12/21/2014  . Carotid artery narrowing 01/28/2014  . Peripheral arterial occlusive disease ([place]) 06/08/2011      Prior to Admission medications   Medication Sig Start Date End Date Taking? Authorizing Provider  albuterol (PROAIR HFA) 108 (90 Base) MCG/ACT inhaler  06/21/16   [provider]  atorvastatin (LIPITOR) 40 MG tablet  12/08/16   [provider]  buPROPion (WELLBUTRIN XL) 150 MG 24 hr tablet Take by mouth.    [provider]  clobetasol cream (TEMOVATE) [id number] % Apply 1 application topically 2 (two) times daily. Patient not taking: Reported on 08/15/2018 12/06/15   Luciana Axe, NP  clopidogrel (PLAVIX) 75 MG tablet Take 1 tablet (75 mg total) by mouth daily. 02/20/19   Kris Hartmann, NP  diltiazem (CARDIZEM) 90 MG tablet Take by mouth. 01/24/17   [provider]  escitalopram (LEXAPRO) 5 MG tablet TK 1 T PO QHS 06/27/18   [provider]  hydrocortisone cream 1 % Apply 1 application topically 2 (two) times daily. To right ankle Patient not taking: Reported on 08/15/2018 03/04/18   Schnier, Dolores Lory, MD  hydrOXYzine (ATARAX/VISTARIL) 10 MG tablet TK 1 T PO Q 8 H PRF MOMENTS OF HIGH ANXIETY AND SLEEP 05/31/18   [provider]  ibandronate (BONIVA) 150 MG tablet  12/07/16   [provider]  isosorbide mononitrate (IMDUR) 30 MG 24 hr tablet Take by  mouth. 01/24/17   [provider]  JANTOVEN 2 MG tablet TAKE 1 TABLET TWICE A DAY 05/14/19   Kris Hartmann, NP  losartan (COZAAR) 25 MG tablet  12/01/16   [provider]  metoprolol tartrate (LOPRESSOR) 25 MG tablet Take 0.5 tablets (12.5 mg total) by mouth 2 (two) times daily. 07/03/16 10/01/16  Wende Bushy, MD  nitroGLYCERIN (NITROSTAT) 0.4 MG SL tablet Place 1 tablet (0.4 mg total) under the tongue every 5 (five) minutes as needed for chest pain. Patient not taking: Reported on 08/15/2018 01/27/16   Wende Bushy, MD  omeprazole (PRILOSEC) 40 MG capsule Take 1 capsule (40 mg total) by mouth daily. 09/30/18   Lucilla Lame, MD  pantoprazole (PROTONIX) 40 MG tablet TAKE 1 TABLET BY MOUTH TWO  TIMES DAILY 06/07/16   Krebs, Genevie Cheshire, NP  polyethylene glycol (GOLYTELY) 236 g solution Drink one 8 oz glass every 20 mins until stools are clear starting at 5:00pm on 11/04/17 Patient not taking: Reported on 08/15/2018 11/02/17   Lucilla Lame,  MD  PROLIA 60 MG/ML SOLN injection Inject 60 mg into the skin every 6 (six) months.  04/27/16   [provider]  venlafaxine XR (EFFEXOR-XR) 75 MG 24 hr capsule TAKE 1 CAPSULE BY MOUTH  DAILY WITH BREAKFAST 06/07/16   Krebs, Genevie Cheshire, NP    Allergies Amoxicillin and Vicodin [hydrocodone-acetaminophen]    Social History Social History   Tobacco Use  . Smoking status: Former Smoker    Packs/day: 2.00    Years: 25.00    Pack years: 50.00    Types: Cigarettes    Quit date: 08/22/1991    Years since quitting: 27.7  . Smokeless tobacco: Never Used  Substance Use Topics  . Alcohol use: No    Alcohol/week: 0.0 standard drinks  . Drug use: Yes    Comment: tramadol X4 50mg  daily    Review of Systems Patient denies headaches, rhinorrhea, blurry vision, numbness, shortness of breath, chest pain, edema, cough, abdominal pain, nausea, vomiting, diarrhea, dysuria, fevers, rashes or hallucinations unless otherwise stated above in HPI.  ____________________________________________   PHYSICAL EXAM:  VITAL SIGNS: Vitals:   05/27/19 1310  BP: 129/71  Pulse: 97  Resp: 16  Temp: 98.8 F (37.1 C)  SpO2: 97%    Constitutional: Alert and oriented.  Eyes: Conjunctivae are normal.  Head: Atraumatic. Nose: No congestion/rhinnorhea. Mouth/Throat: Mucous membranes are moist.   Neck: No stridor. Painless ROM.  Cardiovascular: Normal rate, regular rhythm. Grossly normal heart sounds.  Good peripheral circulation. Respiratory: Normal respiratory effort.  No retractions. Lungs CTAB. Gastrointestinal: Soft and nontender. No distention. No abdominal bruits. No CVA tenderness. Genitourinary:  Musculoskeletal: No lower extremity tenderness nor edema.  No erythema or warmth.  Good brisk cap refill.  DP and PT pulses are palpable bilateral lower extremity.  No joint effusions. Neurologic:  Normal speech and language. No gross focal neurologic deficits are appreciated. No facial droop Skin:  Skin is warm, dry and intact. No rash noted. Psychiatric: Mood and affect are normal. Speech and behavior are normal.  ____________________________________________   LABS (all labs ordered are listed, but only abnormal results are displayed)  Results for orders placed or performed during the hospital encounter of 05/27/19 (from the past 24 hour(s))  Protime-INR     Status: Abnormal   Collection Time: 05/27/19  1:19 PM  Result Value Ref Range   Prothrombin Time 15.5 (H) 11.4 - 15.2 seconds   INR 1.2 0.8 - 1.2   ____________________________________________ _______________________________  RADIOLOGY  I personally reviewed all radiographic images ordered to evaluate for the above acute complaints and reviewed radiology reports and findings.  These findings were personally discussed with the patient.  Please see medical record for radiology report.  ____________________________________________   PROCEDURES  Procedure(s) performed:   Procedures    Critical Care performed: no ____________________________________________   INITIAL IMPRESSION / ASSESSMENT AND PLAN / ED COURSE  Pertinent labs & imaging results that were available during my care of the patient were reviewed by me and considered in my medical decision making (see chart for details).   DDX: dvt, contusion, limb ischemia, claudication, cellulitis  Sara [name] is a 68 y.o. who presents to the ED with symptoms as described above.  Patient nontoxic-appearing.  Exam is reassuring with warm lower extremities palpable pulses.  No rest pain described.  Will order ultrasound as well as check INR.  Clinical Course as of May 26 1509  Tue May 27, 2019  1443 Patient subtherapeutic on her INR.  Will give additional   [  PR]  1456 Repeat examination patient does still have good distal perfusion and is denying any rest pain.  Doppler signal is slightly weaker on the right particular with the DP but Doppler signals are present bilaterally.  Do suspect she is having some component of worsening claudication symptoms likely precipitated by subtherapeutic anticoagulation.  Will discuss in consultation with vascular surgery for further recommendations.   [PR]    Clinical Course User Index [PR] Merlyn Lot, MD    The patient was evaluated in Emergency Department today for the symptoms described in the history of present illness. He/she was evaluated in the context of the global COVID-19 pandemic, which necessitated consideration that the patient might be at risk for infection with the SARS-CoV-2 virus that causes COVID-19. Institutional protocols and algorithms that pertain to the evaluation of patients at risk for COVID-19 are in a state of rapid change based on information released by regulatory bodies including the CDC and federal and state organizations. These policies and algorithms were followed during the patient's care in the ED.  As part of my medical decision  making, I reviewed the following data within the [place] notes reviewed and incorporated, Labs reviewed, notes from prior ED visits and [place] Controlled Substance Database   ____________________________________________   FINAL CLINICAL IMPRESSION(S) / ED DIAGNOSES  Final diagnoses:  Right leg pain      NEW MEDICATIONS STARTED DURING THIS VISIT:  New Prescriptions   No medications on file     Note:  This document was prepared using Dragon voice recognition software and may include unintentional dictation errors.    Merlyn Lot, MD 05/27/19 [phone]

## 2019-05-28 NOTE — Progress Notes (Signed)
MRN : [id number]  Sara [name] is a 68 y.o. (1951/01/25) female who presents with chief complaint of No chief complaint on file. Marland Kitchen  History of Present Illness:   The patient returns to the office for follow up sooner than expected.  She was seen in the [facility] ER on 10/6/202. There has been a significant deterioration in the lower extremity symptoms.  The patient notes interval shortening of their claudication distance and development of severe rest pain symptoms. No new ulcers or wounds have occurred since the last visit.  In the ER she was seen for evaluation of right anterior leg pain that started this past Tuesday.  Since Tuesday she feels that the symptoms are a bit worse.  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  Denies any fevers or redness.  No trauma.  There have been no significant changes to the patient's overall health care.  The patient denies amaurosis fugax or recent TIA symptoms. There are no recent neurological changes noted. The patient denies history of DVT, PE or superficial thrombophlebitis. The patient denies recent episodes of angina or shortness of breath.     No outpatient medications have been marked as taking for the 05/29/19 encounter (Appointment) with Delana Meyer, Dolores Lory, MD.    Past Medical History:  Diagnosis Date  . Depression   . GERD (gastroesophageal reflux disease)   . History of blood clots   . Mild mitral regurgitation   . Osteoporosis   . Stomach ulcer   . Vascular disease    Sees Dr. Delana Meyer  . Vertigo    Last episode approx Aug 2015  . Wears dentures    full upper    Past Surgical History:  Procedure Laterality Date  . BREAST CYST ASPIRATION Left   . CARDIAC CATHETERIZATION  02/15/2017   [facility]  . COLONOSCOPY WITH PROPOFOL N/A 10/22/2017   Procedure: COLONOSCOPY WITH PROPOFOL;  Surgeon: Lucilla Lame, MD;  Location: [place];  Service: Endoscopy;  Laterality: N/A;  specimens not  taken--pt on Plavix will be brought back in after 7 days off med  . COLONOSCOPY WITH PROPOFOL N/A 11/05/2017   Procedure: COLONOSCOPY WITH PROPOFOL;  Surgeon: Lucilla Lame, MD;  Location: [place];  Service: Endoscopy;  Laterality: N/A;  . ESOPHAGOGASTRODUODENOSCOPY N/A 12/21/2014   Procedure: ESOPHAGOGASTRODUODENOSCOPY (EGD);  Surgeon: Lucilla Lame, MD;  Location: [place];  Service: Gastroenterology;  Laterality: N/A;  . ESOPHAGOGASTRODUODENOSCOPY (EGD) WITH PROPOFOL N/A 02/08/2016   Procedure: ESOPHAGOGASTRODUODENOSCOPY (EGD) WITH PROPOFOL;  Surgeon: Lucilla Lame, MD;  Location: [facility];  Service: Endoscopy;  Laterality: N/A;  . HEMORROIDECTOMY  2014  . PERIPHERAL VASCULAR CATHETERIZATION N/A 02/09/2016   Procedure: Abdominal Aortogram w/Lower Extremity;  Surgeon: [name], MD;  Location: [place] [facility];  Service: Cardiovascular;  Laterality: N/A;  . PERIPHERAL VASCULAR CATHETERIZATION Right 02/10/2016   Procedure: Lower Extremity Angiography;  Surgeon: [name], MD;  Location: [place] [facility];  Service: Cardiovascular;  Laterality: Right;  . POLYPECTOMY  11/05/2017   Procedure: POLYPECTOMY;  Surgeon: Lucilla Lame, MD;  Location: [place];  Service: Endoscopy;;  . TONSILECTOMY, ADENOIDECTOMY, BILATERAL MYRINGOTOMY AND TUBES  1968  . VASCULAR SURGERY  [id number]   Fem-Pop Bypass    Social History Social History   Tobacco Use  . Smoking status: Former Smoker    Packs/day: 2.00    Years: 25.00    Pack years: 50.00    Types: Cigarettes  Quit date: 08/22/1991    Years since quitting: 27.7  . Smokeless tobacco: Never Used  Substance Use Topics  . Alcohol use: No    Alcohol/week: 0.0 standard drinks  . Drug use: Yes    Comment: tramadol X4 50mg  daily    Family History Family History  Problem Relation Age of Onset  . CVA Mother   . Heart attack Mother   . Cancer Father        colon cancer  . Colon cancer Father   .  Heart disease Maternal Uncle   . Breast cancer Neg Hx     Allergies  Allergen Reactions  . Amoxicillin Other (See Comments)    Yeast infection  . Vicodin [Hydrocodone-Acetaminophen] Hives and Rash     REVIEW OF SYSTEMS (Negative unless checked)  Constitutional: [] Weight loss  [] Fever  [] Chills Cardiac: [] Chest pain   [] Chest pressure   [] Palpitations   [] Shortness of breath when laying flat   [] Shortness of breath with exertion. Vascular:  [x] Pain in legs with walking   [x] Pain in legs at rest  [] History of DVT   [] Phlebitis   [] Swelling in legs   [] Varicose veins   [] Non-healing ulcers Pulmonary:   [] Uses home oxygen   [] Productive cough   [] Hemoptysis   [] Wheeze  [] COPD   [] Asthma Neurologic:  [] Dizziness   [] Seizures   [] History of stroke   [] History of TIA  [] Aphasia   [] Vissual changes   [] Weakness or numbness in arm   [] Weakness or numbness in leg Musculoskeletal:   [] Joint swelling   [] Joint pain   [] Low back pain Hematologic:  [] Easy bruising  [] Easy bleeding   [] Hypercoagulable state   [] Anemic Gastrointestinal:  [] Diarrhea   [] Vomiting  [] Gastroesophageal reflux/heartburn   [] Difficulty swallowing. Genitourinary:  [] Chronic kidney disease   [] Difficult urination  [] Frequent urination   [] Blood in urine Skin:  [] Rashes   [] Ulcers  Psychological:  [] History of anxiety   []  History of major depression.  Physical Examination  There were no vitals filed for this visit. There is no height or weight on file to calculate BMI. Gen: WD/WN, NAD Head: [place]/AT, No temporalis wasting.  Ear/Nose/Throat: Hearing grossly intact, nares w/o erythema or drainage Eyes: PER, EOMI, sclera nonicteric.  Neck: Supple, no large masses.   Pulmonary:  Good air movement, no audible wheezing bilaterally, no use of accessory muscles.  Cardiac: RRR, no JVD Vascular: Right foot is cold with very delayed cap refill, Well healed right fem distal bypass scar.  scattered varicosities present bilaterally.   Mild venous stasis changes to the legs bilaterally.  2+ soft pitting edema Vessel Right Left  Radial Palpable Palpable  PT Not Palpable Not Palpable  DP Not Palpable Not Palpable  Gastrointestinal: Non-distended. No guarding/no peritoneal signs.  Musculoskeletal: M/S 5/5 throughout.  No deformity or atrophy.  Neurologic: CN 2-12 intact. Symmetrical.  Speech is fluent. Motor exam as listed above. Psychiatric: Judgment intact, Mood & affect appropriate for pt's clinical situation. Dermatologic: No rashes or ulcers noted.  No changes consistent with cellulitis. Lymph : No lichenification or skin changes of chronic lymphedema.  CBC Lab Results  Component Value Date   WBC 16.2 (H) 02/14/2016   HGB 12.7 02/14/2016   HCT 37.5 02/14/2016   MCV 95.2 02/14/2016   PLT 116 (L) 02/14/2016    BMET    Component Value Date/Time   NA 138 05/16/2016 1034   NA 139 09/21/2015 0916   K 4.5 05/16/2016 1034   CL 106  05/16/2016 1034   CO2 21 05/16/2016 1034   GLUCOSE 103 (H) 05/16/2016 1034   BUN 12 05/16/2016 1034   BUN 12 09/21/2015 0916   CREATININE 0.88 05/16/2016 1034   CALCIUM 8.8 05/16/2016 1034   GFRNONAA 66 05/01/2016 0846   GFRAA 76 05/01/2016 0846   CrCl cannot be calculated (Patient's most recent lab result is older than the maximum 21 days allowed.).  COAG Lab Results  Component Value Date   INR 1.2 05/27/2019   INR 2.99 10/10/2018   INR 2.64 09/13/2018    Radiology US Venous Img Lower Unilateral Right  Result Date: 05/27/2019 CLINICAL DATA:  Right shin pain EXAM: RIGHT LOWER EXTREMITY VENOUS DOPPLER ULTRASOUND TECHNIQUE: Gray-scale sonography with graded compression, as well as color Doppler and duplex ultrasound were performed to evaluate the lower extremity deep venous systems from the level of the common femoral vein and including the common femoral, femoral, profunda femoral, popliteal and calf veins including the posterior tibial, peroneal and gastrocnemius veins when  visible. The superficial great saphenous vein was also interrogated. Spectral Doppler was utilized to evaluate flow at rest and with distal augmentation maneuvers in the common femoral, femoral and popliteal veins. COMPARISON:  None. FINDINGS: Contralateral Common Femoral Vein: Respiratory phasicity is normal and symmetric with the symptomatic side. No evidence of thrombus. Normal compressibility. Common Femoral Vein: No evidence of thrombus. Normal compressibility, respiratory phasicity and response to augmentation. Saphenofemoral Junction: No evidence of thrombus. Normal compressibility and flow on color Doppler imaging. Profunda Femoral Vein: No evidence of thrombus. Normal compressibility and flow on color Doppler imaging. Femoral Vein: No evidence of thrombus. Normal compressibility, respiratory phasicity and response to augmentation. Popliteal Vein: No evidence of thrombus. Normal compressibility, respiratory phasicity and response to augmentation. Calf Veins: No evidence of thrombus. Normal compressibility and flow on color Doppler imaging. Superficial Great Saphenous Vein: No evidence of thrombus. Normal compressibility. Venous Reflux:  None. 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 There is thrombus within the graft. This is of unknown chronicity. Of note, on the CT dated February 05, 2016 the graft was occluded. IMPRESSION: 1. No DVT. 2. 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 Electronically Signed   By: Constance Holster M.D.   On: 05/27/2019 14:42     Assessment/Plan 1. Atherosclerosis of artery of extremity with rest pain ([place]) Recommend:  The patient has evidence of severe atherosclerotic changes of both lower extremities with right leg rest pain that is associated with preulcerative changes and impending tissue loss of the foot.   This represents a limb threatening ischemia and places the patient at the risk for right limb loss.  Patient should undergo angiography of the lower extremities with the hope for intervention for limb salvage.  The risks and benefits as well as the alternative therapies was discussed in detail with the patient.  All questions were answered.  Patient agrees to proceed with right leg angiography.  The patient will follow up with me in the office after the procedure.   2. Bilateral carotid artery stenosis Recommend:  Given the patient's asymptomatic subcritical stenosis no further invasive testing or surgery at this time.    Continue antiplatelet therapy as prescribed Continue management of CAD, HTN and Hyperlipidemia Healthy heart diet,  encouraged exercise at least 4 times per week Follow up in 6 months with duplex ultrasound  and physical exam   3. Coronary artery disease involving native coronary artery of native heart with angina pectoris ([place]) Continue cardiac and antihypertensive medications as already ordered and reviewed, no changes at this time.  Continue statin as ordered and reviewed, no changes at this time  Nitrates PRN for chest pain   4. Essential hypertension Continue antihypertensive medications as already ordered, these medications have been reviewed and there are no changes at this time.   5. Chronic venous insufficiency No surgery or intervention at this point in time.    I have had a long discussion with the patient regarding venous insufficiency and why it  causes symptoms. I have discussed with the patient the chronic skin changes that accompany venous insufficiency and the long term sequela such as infection and ulceration.  Patient will begin wearing graduated compression stockings class 1 (20-30 mmHg) or compression wraps on a daily basis a prescription was given. The patient will put the stockings on first thing in the morning and removing them in the evening.  The patient is instructed specifically not to sleep in the stockings.    In addition, behavioral modification including several periods of elevation of the lower extremities during the day will be continued. I have demonstrated that proper elevation is a position with the ankles at heart level.  The patient is instructed to begin routine exercise, especially walking on a daily basis     [name], MD  05/28/2019 10:07 AM

## 2019-05-29 ENCOUNTER — Ambulatory Visit (INDEPENDENT_AMBULATORY_CARE_PROVIDER_SITE_OTHER): Payer: Medicare Other | Admitting: Vascular Surgery

## 2019-05-29 ENCOUNTER — Other Ambulatory Visit: Payer: Self-pay

## 2019-05-29 ENCOUNTER — Other Ambulatory Visit (INDEPENDENT_AMBULATORY_CARE_PROVIDER_SITE_OTHER): Payer: Self-pay | Admitting: Nurse Practitioner

## 2019-05-29 ENCOUNTER — Encounter (INDEPENDENT_AMBULATORY_CARE_PROVIDER_SITE_OTHER): Payer: Self-pay | Admitting: Vascular Surgery

## 2019-05-29 ENCOUNTER — Telehealth (INDEPENDENT_AMBULATORY_CARE_PROVIDER_SITE_OTHER): Payer: Self-pay

## 2019-05-29 ENCOUNTER — Encounter (INDEPENDENT_AMBULATORY_CARE_PROVIDER_SITE_OTHER): Payer: Self-pay

## 2019-05-29 ENCOUNTER — Other Ambulatory Visit
Admission: RE | Admit: 2019-05-29 | Discharge: 2019-05-29 | Disposition: A | Discharge status: Home / Self Care | Payer: Medicare Other | Source: Ambulatory Visit | Attending: Vascular Surgery | Admitting: Vascular Surgery

## 2019-05-29 VITALS — BP 118/71 | HR 106 | Resp 10 | Ht 63.0 in | Wt 128.0 lb

## 2019-05-29 DIAGNOSIS — F329 Major depressive disorder, single episode, unspecified: Secondary | ICD-10-CM | POA: Insufficient documentation

## 2019-05-29 DIAGNOSIS — Z20828 Contact with and (suspected) exposure to other viral communicable diseases: Secondary | ICD-10-CM | POA: Insufficient documentation

## 2019-05-29 DIAGNOSIS — I6523 Occlusion and stenosis of bilateral carotid arteries: Secondary | ICD-10-CM

## 2019-05-29 DIAGNOSIS — I25119 Atherosclerotic heart disease of native coronary artery with unspecified angina pectoris: Secondary | ICD-10-CM

## 2019-05-29 DIAGNOSIS — I10 Essential (primary) hypertension: Secondary | ICD-10-CM

## 2019-05-29 DIAGNOSIS — Z01812 Encounter for preprocedural laboratory examination: Secondary | ICD-10-CM | POA: Insufficient documentation

## 2019-05-29 DIAGNOSIS — F32A Depression, unspecified: Secondary | ICD-10-CM | POA: Insufficient documentation

## 2019-05-29 DIAGNOSIS — I779 Disorder of arteries and arterioles, unspecified: Secondary | ICD-10-CM

## 2019-05-29 DIAGNOSIS — I70229 Atherosclerosis of native arteries of extremities with rest pain, unspecified extremity: Secondary | ICD-10-CM

## 2019-05-29 DIAGNOSIS — I872 Venous insufficiency (chronic) (peripheral): Secondary | ICD-10-CM

## 2019-05-29 LAB — SARS CORONAVIRUS 2 (TAT 6-24 HRS): SARS Coronavirus 2: NEGATIVE

## 2019-05-29 MED ORDER — CEFAZOLIN SODIUM-DEXTROSE 2-4 GM/100ML-% IV SOLN: 2.0000 g | Freq: Once | INTRAVENOUS | Status: DC

## 2019-05-29 NOTE — Telephone Encounter (Signed)
Patient was seen in the office and was scheduled for right leg angio with Dr. Delana Meyer on 05/30/2019 with a 9:30 am arrival time to the [facility]. Patient will do her Covid testing today at the [facility] [place]. Patient was given her pre-procedure instructions and asked to go to the [facility] to do her testing now.

## 2019-05-30 ENCOUNTER — Encounter
Admission: RE | Disposition: A | Discharge status: Home / Self Care | Payer: Self-pay | Source: Ambulatory Visit | Attending: Vascular Surgery

## 2019-05-30 ENCOUNTER — Inpatient Hospital Stay
Admission: RE | Admit: 2019-05-30 | Discharge: 2019-06-07 | DRG: 270 | Disposition: A | Discharge status: Home / Self Care | Payer: Medicare Other | Source: Ambulatory Visit | Attending: Vascular Surgery | Admitting: Vascular Surgery

## 2019-05-30 ENCOUNTER — Observation Stay: Payer: Medicare Other | Admitting: Anesthesiology

## 2019-05-30 ENCOUNTER — Encounter: Payer: Self-pay | Admitting: *Deleted

## 2019-05-30 ENCOUNTER — Other Ambulatory Visit: Payer: Self-pay

## 2019-05-30 DIAGNOSIS — I2510 Atherosclerotic heart disease of native coronary artery without angina pectoris: Secondary | ICD-10-CM | POA: Diagnosis present

## 2019-05-30 DIAGNOSIS — I779 Disorder of arteries and arterioles, unspecified: Secondary | ICD-10-CM

## 2019-05-30 DIAGNOSIS — E785 Hyperlipidemia, unspecified: Secondary | ICD-10-CM | POA: Diagnosis present

## 2019-05-30 DIAGNOSIS — K219 Gastro-esophageal reflux disease without esophagitis: Secondary | ICD-10-CM | POA: Diagnosis present

## 2019-05-30 DIAGNOSIS — Z87891 Personal history of nicotine dependence: Secondary | ICD-10-CM

## 2019-05-30 DIAGNOSIS — Z7901 Long term (current) use of anticoagulants: Secondary | ICD-10-CM

## 2019-05-30 DIAGNOSIS — I70219 Atherosclerosis of native arteries of extremities with intermittent claudication, unspecified extremity: Secondary | ICD-10-CM | POA: Diagnosis present

## 2019-05-30 DIAGNOSIS — Z8249 Family history of ischemic heart disease and other diseases of the circulatory system: Secondary | ICD-10-CM

## 2019-05-30 DIAGNOSIS — Z8711 Personal history of peptic ulcer disease: Secondary | ICD-10-CM

## 2019-05-30 DIAGNOSIS — I70223 Atherosclerosis of native arteries of extremities with rest pain, bilateral legs: Secondary | ICD-10-CM | POA: Diagnosis present

## 2019-05-30 DIAGNOSIS — I998 Other disorder of circulatory system: Secondary | ICD-10-CM

## 2019-05-30 DIAGNOSIS — T82868A Thrombosis of vascular prosthetic devices, implants and grafts, initial encounter: Principal | ICD-10-CM | POA: Diagnosis present

## 2019-05-30 DIAGNOSIS — Z880 Allergy status to penicillin: Secondary | ICD-10-CM

## 2019-05-30 DIAGNOSIS — R571 Hypovolemic shock: Secondary | ICD-10-CM | POA: Diagnosis not present

## 2019-05-30 DIAGNOSIS — Z885 Allergy status to narcotic agent status: Secondary | ICD-10-CM

## 2019-05-30 DIAGNOSIS — I70229 Atherosclerosis of native arteries of extremities with rest pain, unspecified extremity: Secondary | ICD-10-CM

## 2019-05-30 DIAGNOSIS — I10 Essential (primary) hypertension: Secondary | ICD-10-CM | POA: Diagnosis present

## 2019-05-30 DIAGNOSIS — Z20828 Contact with and (suspected) exposure to other viral communicable diseases: Secondary | ICD-10-CM | POA: Diagnosis present

## 2019-05-30 DIAGNOSIS — Z972 Presence of dental prosthetic device (complete) (partial): Secondary | ICD-10-CM

## 2019-05-30 DIAGNOSIS — M79A21 Nontraumatic compartment syndrome of right lower extremity: Secondary | ICD-10-CM | POA: Diagnosis not present

## 2019-05-30 DIAGNOSIS — M810 Age-related osteoporosis without current pathological fracture: Secondary | ICD-10-CM | POA: Diagnosis present

## 2019-05-30 DIAGNOSIS — Z7902 Long term (current) use of antithrombotics/antiplatelets: Secondary | ICD-10-CM

## 2019-05-30 DIAGNOSIS — Z86718 Personal history of other venous thrombosis and embolism: Secondary | ICD-10-CM

## 2019-05-30 DIAGNOSIS — Z79899 Other long term (current) drug therapy: Secondary | ICD-10-CM

## 2019-05-30 DIAGNOSIS — I739 Peripheral vascular disease, unspecified: Secondary | ICD-10-CM

## 2019-05-30 HISTORY — PX: FASCIOTOMY: SHX132

## 2019-05-30 HISTORY — DX: Essential (primary) hypertension: I10

## 2019-05-30 HISTORY — DX: Hyperlipidemia, unspecified: E78.5

## 2019-05-30 HISTORY — PX: LOWER EXTREMITY ANGIOGRAPHY: CATH118251

## 2019-05-30 LAB — PROTIME-INR
INR: 1.8 — ABNORMAL HIGH (ref 0.8–1.2)
Prothrombin Time: 20.3 seconds — ABNORMAL HIGH (ref 11.4–15.2)

## 2019-05-30 LAB — CBC
HCT: 24.1 % — ABNORMAL LOW (ref 36.0–46.0)
Hemoglobin: 7.2 g/dL — ABNORMAL LOW (ref 12.0–15.0)
MCH: 26.3 pg (ref 26.0–34.0)
MCHC: 29.9 g/dL — ABNORMAL LOW (ref 30.0–36.0)
MCV: 88 fL (ref 80.0–100.0)
Platelets: 188 10*3/uL (ref 150–400)
RBC: 2.74 MIL/uL — ABNORMAL LOW (ref 3.87–5.11)
RDW: 18.1 % — ABNORMAL HIGH (ref 11.5–15.5)
WBC: 18.9 10*3/uL — ABNORMAL HIGH (ref 4.0–10.5)
nRBC: 0 % (ref 0.0–0.2)

## 2019-05-30 LAB — CREATININE, SERUM
Creatinine, Ser: 0.74 mg/dL (ref 0.44–1.00)
GFR calc Af Amer: >60 mL/min (ref >60)
GFR calc non Af Amer: >60 mL/min (ref >60)

## 2019-05-30 LAB — PREPARE RBC (CROSSMATCH)

## 2019-05-30 LAB — ABO/RH: ABO/RH(D): O POS

## 2019-05-30 LAB — BUN: BUN: 11 mg/dL (ref 8–23)

## 2019-05-30 SURGERY — LOWER EXTREMITY ANGIOGRAPHY
Anesthesia: Moderate Sedation | Laterality: Right

## 2019-05-30 SURGERY — FASCIOTOMY, UPPER EXTREMITY
Anesthesia: General | Laterality: Right

## 2019-05-30 MED ORDER — SODIUM CHLORIDE 0.9 % IV SOLN: INTRAVENOUS | Status: AC

## 2019-05-30 MED ORDER — HYDROMORPHONE HCL 1 MG/ML IJ SOLN
INTRAMUSCULAR | Status: AC
  Administered 2019-05-30: 0.5 mg
  Filled 2019-05-30: qty 0.5

## 2019-05-30 MED ORDER — HYDROMORPHONE HCL 1 MG/ML IJ SOLN
1.0000 mg | Freq: Once | INTRAMUSCULAR | Status: AC | PRN
  Administered 2019-05-30: 14:00:00 0.5 mg via INTRAVENOUS

## 2019-05-30 MED ORDER — KETOROLAC TROMETHAMINE 30 MG/ML IJ SOLN: 30.0000 mg | Freq: Four times a day (QID) | INTRAMUSCULAR | Status: DC

## 2019-05-30 MED ORDER — CLOPIDOGREL BISULFATE 75 MG PO TABS: 75.0000 mg | ORAL_TABLET | Freq: Every day | ORAL | Status: DC

## 2019-05-30 MED ORDER — NALOXONE HCL 2 MG/2ML IJ SOSY
PREFILLED_SYRINGE | INTRAMUSCULAR | Status: AC
  Administered 2019-05-30: 0.2 mg via INTRAVENOUS
  Filled 2019-05-30: qty 2

## 2019-05-30 MED ORDER — WARFARIN SODIUM 4 MG PO TABS: 4.0000 mg | ORAL_TABLET | Freq: Once | ORAL | Status: DC

## 2019-05-30 MED ORDER — HYDROMORPHONE HCL 1 MG/ML IJ SOLN
INTRAMUSCULAR | Status: AC
  Filled 2019-05-30: qty 1

## 2019-05-30 MED ORDER — MIDAZOLAM HCL 2 MG/2ML IJ SOLN
INTRAMUSCULAR | Status: AC
  Filled 2019-05-30: qty 2

## 2019-05-30 MED ORDER — CLOPIDOGREL BISULFATE 75 MG PO TABS
75.0000 mg | ORAL_TABLET | Freq: Every day | ORAL | Status: DC
  Administered 2019-06-01 – 2019-06-07 (×7): 75 mg via ORAL
  Filled 2019-05-30 (×8): qty 1

## 2019-05-30 MED ORDER — SODIUM CHLORIDE 0.9 % IV SOLN
INTRAVENOUS | Status: DC
  Administered 2019-05-30: 10:00:00 via INTRAVENOUS

## 2019-05-30 MED ORDER — LIDOCAINE HCL (CARDIAC) PF 100 MG/5ML IV SOSY
PREFILLED_SYRINGE | INTRAVENOUS | Status: DC | PRN
  Administered 2019-05-30: 100 mg via INTRAVENOUS

## 2019-05-30 MED ORDER — HYDROMORPHONE HCL 1 MG/ML IJ SOLN
INTRAMUSCULAR | Status: AC
  Administered 2019-05-30: 1 mg
  Filled 2019-05-30: qty 0.5

## 2019-05-30 MED ORDER — HEPARIN SODIUM (PORCINE) 1000 UNIT/ML IJ SOLN
INTRAMUSCULAR | Status: AC
  Filled 2019-05-30: qty 1

## 2019-05-30 MED ORDER — ATORVASTATIN CALCIUM 20 MG PO TABS: 40.0000 mg | ORAL_TABLET | Freq: Every day | ORAL | Status: DC

## 2019-05-30 MED ORDER — FENTANYL CITRATE (PF) 100 MCG/2ML IJ SOLN
INTRAMUSCULAR | Status: AC
  Filled 2019-05-30: qty 2

## 2019-05-30 MED ORDER — HYDROMORPHONE HCL 1 MG/ML IJ SOLN: 1.0000 mg | Freq: Once | INTRAMUSCULAR | Status: DC

## 2019-05-30 MED ORDER — CLINDAMYCIN PHOSPHATE 300 MG/50ML IV SOLN: 300.0000 mg | Freq: Once | INTRAVENOUS | Status: DC

## 2019-05-30 MED ORDER — HEPARIN (PORCINE) 25000 UT/250ML-% IV SOLN
650.0000 [IU]/h | INTRAVENOUS | Status: DC
  Administered 2019-05-30 (×2): 650 [IU]/h via INTRAVENOUS

## 2019-05-30 MED ORDER — ONDANSETRON HCL 4 MG/2ML IJ SOLN
4.0000 mg | Freq: Four times a day (QID) | INTRAMUSCULAR | Status: DC | PRN
  Administered 2019-05-30: 4 mg via INTRAVENOUS

## 2019-05-30 MED ORDER — SUGAMMADEX SODIUM 200 MG/2ML IV SOLN
INTRAVENOUS | Status: AC
  Filled 2019-05-30: qty 2

## 2019-05-30 MED ORDER — ROCURONIUM BROMIDE 50 MG/5ML IV SOLN
INTRAVENOUS | Status: AC
  Filled 2019-05-30: qty 1

## 2019-05-30 MED ORDER — FAMOTIDINE 20 MG PO TABS: 40.0000 mg | ORAL_TABLET | Freq: Once | ORAL | Status: DC | PRN

## 2019-05-30 MED ORDER — ONDANSETRON HCL 4 MG/2ML IJ SOLN
INTRAMUSCULAR | Status: DC | PRN
  Administered 2019-05-30: 4 mg via INTRAVENOUS

## 2019-05-30 MED ORDER — ACETAMINOPHEN 10 MG/ML IV SOLN
1000.0000 mg | Freq: Once | INTRAVENOUS | Status: AC
  Administered 2019-05-30: 1000 mg via INTRAVENOUS

## 2019-05-30 MED ORDER — SUCCINYLCHOLINE CHLORIDE 20 MG/ML IJ SOLN
INTRAMUSCULAR | Status: DC | PRN
  Administered 2019-05-30: 100 mg via INTRAVENOUS

## 2019-05-30 MED ORDER — SUGAMMADEX SODIUM 200 MG/2ML IV SOLN
INTRAVENOUS | Status: DC | PRN
  Administered 2019-05-30: 100 mg via INTRAVENOUS

## 2019-05-30 MED ORDER — ACETAMINOPHEN 325 MG PO TABS
650.0000 mg | ORAL_TABLET | Freq: Four times a day (QID) | ORAL | Status: DC | PRN
  Administered 2019-05-31 – 2019-06-04 (×6): 650 mg via ORAL
  Filled 2019-05-30 (×6): qty 2

## 2019-05-30 MED ORDER — LACTATED RINGERS IV SOLN
INTRAVENOUS | Status: DC | PRN
  Administered 2019-05-30: 19:00:00 via INTRAVENOUS

## 2019-05-30 MED ORDER — HYDROMORPHONE HCL 1 MG/ML IJ SOLN
1.0000 mg | INTRAMUSCULAR | Status: AC | PRN
  Administered 2019-05-30: 1 mg via INTRAVENOUS

## 2019-05-30 MED ORDER — ONDANSETRON HCL 4 MG/2ML IJ SOLN
INTRAMUSCULAR | Status: AC
  Filled 2019-05-30: qty 2

## 2019-05-30 MED ORDER — PHENYLEPHRINE HCL (PRESSORS) 10 MG/ML IV SOLN
INTRAVENOUS | Status: DC | PRN
  Administered 2019-05-30 (×2): 100 ug via INTRAVENOUS
  Administered 2019-05-30 (×5): 200 ug via INTRAVENOUS

## 2019-05-30 MED ORDER — IODIXANOL 320 MG/ML IV SOLN
INTRAVENOUS | Status: DC | PRN
  Administered 2019-05-30: 160 mL

## 2019-05-30 MED ORDER — OXYCODONE HCL 5 MG PO TABS: 5.0000 mg | ORAL_TABLET | ORAL | Status: DC | PRN

## 2019-05-30 MED ORDER — HEPARIN (PORCINE) 25000 UT/250ML-% IV SOLN
INTRAVENOUS | Status: AC
  Administered 2019-05-30: 650 [IU]/h via INTRAVENOUS
  Filled 2019-05-30: qty 250

## 2019-05-30 MED ORDER — ONDANSETRON HCL 4 MG/2ML IJ SOLN
4.0000 mg | Freq: Four times a day (QID) | INTRAMUSCULAR | Status: DC | PRN
  Administered 2019-06-04: 4 mg via INTRAVENOUS

## 2019-05-30 MED ORDER — LIDOCAINE HCL (PF) 2 % IJ SOLN
INTRAMUSCULAR | Status: AC
  Filled 2019-05-30: qty 10

## 2019-05-30 MED ORDER — CALCIUM CARBONATE-VITAMIN D 500-200 MG-UNIT PO TABS
2.0000 | ORAL_TABLET | Freq: Every day | ORAL | Status: DC
  Administered 2019-05-31 – 2019-06-07 (×8): 2 via ORAL
  Filled 2019-05-30 (×8): qty 2

## 2019-05-30 MED ORDER — KETOROLAC TROMETHAMINE 30 MG/ML IJ SOLN
30.0000 mg | Freq: Four times a day (QID) | INTRAMUSCULAR | Status: DC
  Administered 2019-05-30: 30 mg via INTRAVENOUS
  Filled 2019-05-30: qty 1

## 2019-05-30 MED ORDER — EPHEDRINE SULFATE 50 MG/ML IJ SOLN
INTRAMUSCULAR | Status: DC | PRN
  Administered 2019-05-30 (×4): 10 mg via INTRAVENOUS

## 2019-05-30 MED ORDER — HEPARIN SODIUM (PORCINE) 1000 UNIT/ML IJ SOLN
INTRAMUSCULAR | Status: DC | PRN
  Administered 2019-05-30: 4000 [IU] via INTRAVENOUS
  Administered 2019-05-30: 3000 [IU] via INTRAVENOUS

## 2019-05-30 MED ORDER — WARFARIN - PHARMACIST DOSING INPATIENT: Freq: Every day | Status: DC

## 2019-05-30 MED ORDER — FENTANYL CITRATE (PF) 100 MCG/2ML IJ SOLN
INTRAMUSCULAR | Status: DC | PRN
  Administered 2019-05-30 (×6): 50 ug via INTRAVENOUS

## 2019-05-30 MED ORDER — SODIUM CHLORIDE 0.9 % IV SOLN
500.0000 mL | Freq: Once | INTRAVENOUS | Status: AC | PRN
  Administered 2019-05-30: 500 mL via INTRAVENOUS

## 2019-05-30 MED ORDER — SODIUM CHLORIDE 0.9 % IV SOLN: 250.0000 mL | INTRAVENOUS | Status: DC | PRN

## 2019-05-30 MED ORDER — SODIUM CHLORIDE 0.9% FLUSH: 3.0000 mL | INTRAVENOUS | Status: DC | PRN

## 2019-05-30 MED ORDER — PROPOFOL 10 MG/ML IV BOLUS: INTRAVENOUS | Status: DC | PRN

## 2019-05-30 MED ORDER — LOSARTAN POTASSIUM 25 MG PO TABS
25.0000 mg | ORAL_TABLET | Freq: Every day | ORAL | Status: DC
  Administered 2019-05-31 – 2019-06-07 (×8): 25 mg via ORAL
  Filled 2019-05-30 (×9): qty 1

## 2019-05-30 MED ORDER — MORPHINE SULFATE (PF) 4 MG/ML IV SOLN: 2.0000 mg | INTRAVENOUS | Status: DC | PRN

## 2019-05-30 MED ORDER — IBANDRONATE SODIUM 150 MG PO TABS: 150.0000 mg | ORAL_TABLET | ORAL | Status: DC

## 2019-05-30 MED ORDER — ROCURONIUM BROMIDE 100 MG/10ML IV SOLN
INTRAVENOUS | Status: DC | PRN
  Administered 2019-05-30: 10 mg via INTRAVENOUS

## 2019-05-30 MED ORDER — MIDAZOLAM HCL 2 MG/2ML IJ SOLN
INTRAMUSCULAR | Status: DC | PRN
  Administered 2019-05-30 (×5): 1 mg via INTRAVENOUS
  Administered 2019-05-30: 2 mg via INTRAVENOUS
  Administered 2019-05-30: 1 mg via INTRAVENOUS

## 2019-05-30 MED ORDER — ALTEPLASE 2 MG IJ SOLR
INTRAMUSCULAR | Status: DC | PRN
  Administered 2019-05-30: 12 mg

## 2019-05-30 MED ORDER — MIDAZOLAM HCL 2 MG/ML PO SYRP: 8.0000 mg | ORAL_SOLUTION | Freq: Once | ORAL | Status: DC | PRN

## 2019-05-30 MED ORDER — DIPHENHYDRAMINE HCL 50 MG/ML IJ SOLN: 50.0000 mg | Freq: Once | INTRAMUSCULAR | Status: DC | PRN

## 2019-05-30 MED ORDER — FENTANYL CITRATE (PF) 100 MCG/2ML IJ SOLN: 25.0000 ug | INTRAMUSCULAR | Status: DC | PRN

## 2019-05-30 MED ORDER — SUCCINYLCHOLINE CHLORIDE 20 MG/ML IJ SOLN
INTRAMUSCULAR | Status: AC
  Filled 2019-05-30: qty 1

## 2019-05-30 MED ORDER — PROPOFOL 10 MG/ML IV BOLUS
INTRAVENOUS | Status: DC | PRN
  Administered 2019-05-30: 90 mg via INTRAVENOUS

## 2019-05-30 MED ORDER — ACETAMINOPHEN 10 MG/ML IV SOLN
INTRAVENOUS | Status: AC
  Filled 2019-05-30: qty 100

## 2019-05-30 MED ORDER — SODIUM CHLORIDE 0.9% FLUSH: 3.0000 mL | Freq: Two times a day (BID) | INTRAVENOUS | Status: DC

## 2019-05-30 MED ORDER — PROPOFOL 10 MG/ML IV BOLUS
INTRAVENOUS | Status: AC
  Filled 2019-05-30: qty 20

## 2019-05-30 MED ORDER — HEPARIN BOLUS VIA INFUSION
1500.0000 [IU] | Freq: Once | INTRAVENOUS | Status: AC
  Administered 2019-05-30: 1500 [IU] via INTRAVENOUS
  Filled 2019-05-30: qty 1500

## 2019-05-30 MED ORDER — VASOPRESSIN 20 UNIT/ML IV SOLN
INTRAVENOUS | Status: DC | PRN
  Administered 2019-05-30 (×3): 2 [IU] via INTRAVENOUS

## 2019-05-30 MED ORDER — METHYLPREDNISOLONE SODIUM SUCC 125 MG IJ SOLR: 125.0000 mg | Freq: Once | INTRAMUSCULAR | Status: DC | PRN

## 2019-05-30 MED ORDER — ONDANSETRON HCL 4 MG/2ML IJ SOLN: 4.0000 mg | Freq: Once | INTRAMUSCULAR | Status: DC | PRN

## 2019-05-30 MED ORDER — PANTOPRAZOLE SODIUM 40 MG PO TBEC
40.0000 mg | DELAYED_RELEASE_TABLET | Freq: Every day | ORAL | Status: DC
  Administered 2019-05-31 – 2019-06-07 (×8): 40 mg via ORAL
  Filled 2019-05-30 (×8): qty 1

## 2019-05-30 MED ORDER — SODIUM CHLORIDE 0.9 % IV SOLN
INTRAVENOUS | Status: AC | PRN
  Administered 2019-05-30: 250 mL via INTRAVENOUS

## 2019-05-30 MED ORDER — CEFAZOLIN SODIUM-DEXTROSE 2-4 GM/100ML-% IV SOLN
INTRAVENOUS | Status: AC
  Administered 2019-05-30: 2 g
  Filled 2019-05-30: qty 100

## 2019-05-30 MED ORDER — ONDANSETRON HCL 4 MG/2ML IJ SOLN: 4.0000 mg | Freq: Four times a day (QID) | INTRAMUSCULAR | Status: DC | PRN

## 2019-05-30 MED ORDER — NALOXONE HCL 2 MG/2ML IJ SOSY
0.2000 mg | PREFILLED_SYRINGE | Freq: Once | INTRAMUSCULAR | Status: AC
  Administered 2019-05-30: 22:00:00 0.2 mg via INTRAVENOUS

## 2019-05-30 MED ORDER — FENTANYL CITRATE (PF) 100 MCG/2ML IJ SOLN
INTRAMUSCULAR | Status: DC | PRN
  Administered 2019-05-30 (×2): 50 ug via INTRAVENOUS

## 2019-05-30 SURGICAL SUPPLY — 40 items
BLADE SURG SZ10 CARB STEEL (BLADE) ×2 IMPLANT
BNDG COHESIVE 4X5 TAN STRL (GAUZE/BANDAGES/DRESSINGS) ×2 IMPLANT
BNDG ELASTIC 6X5.8 VLCR NS LF (GAUZE/BANDAGES/DRESSINGS) ×2 IMPLANT
BNDG GAUZE 4.5X4.1 6PLY STRL (MISCELLANEOUS) ×4 IMPLANT
CANISTER SUCT 1200ML W/VALVE (MISCELLANEOUS) ×2 IMPLANT
CANISTER WOUND CARE 500ML ATS (WOUND CARE) IMPLANT
CHLORAPREP W/TINT 26 (MISCELLANEOUS) ×2 IMPLANT
COVER WAND RF STERILE (DRAPES) ×2 IMPLANT
DRESSING SURGICEL FIBRLLR 1X2 (HEMOSTASIS) ×5 IMPLANT
DRSG GAUZE FLUFF 36X18 (GAUZE/BANDAGES/DRESSINGS) IMPLANT
DRSG MEPITEL 4X7.2 (GAUZE/BANDAGES/DRESSINGS) ×6 IMPLANT
DRSG SURGICEL FIBRILLAR 1X2 (HEMOSTASIS) ×10
DRSG VAC ATS MED SENSATRAC (GAUZE/BANDAGES/DRESSINGS) IMPLANT
ELECT CAUTERY BLADE 6.4 (BLADE) ×2 IMPLANT
ELECT REM PT RETURN 9FT ADLT (ELECTROSURGICAL) ×2
ELECTRODE REM PT RTRN 9FT ADLT (ELECTROSURGICAL) ×1 IMPLANT
GAUZE XEROFORM 1X8 LF (GAUZE/BANDAGES/DRESSINGS) ×2 IMPLANT
GLOVE SURG SYN 8.0 (GLOVE) ×2 IMPLANT
GOWN STRL REUS W/ TWL LRG LVL3 (GOWN DISPOSABLE) ×3 IMPLANT
GOWN STRL REUS W/ TWL XL LVL3 (GOWN DISPOSABLE) ×1 IMPLANT
GOWN STRL REUS W/TWL LRG LVL3 (GOWN DISPOSABLE) ×3
GOWN STRL REUS W/TWL XL LVL3 (GOWN DISPOSABLE) ×1
HANDLE YANKAUER SUCT BULB TIP (MISCELLANEOUS) ×2 IMPLANT
KIT TURNOVER KIT A (KITS) ×2 IMPLANT
NS IRRIG 500ML POUR BTL (IV SOLUTION) ×2 IMPLANT
PACK EXTREMITY ARMC (MISCELLANEOUS) ×2 IMPLANT
PAD ABD DERMACEA PRESS 5X9 (GAUZE/BANDAGES/DRESSINGS) ×16 IMPLANT
PAD PREP 24X41 OB/GYN DISP (PERSONAL CARE ITEMS) ×2 IMPLANT
SPONGE LAP 18X18 RF (DISPOSABLE) ×4 IMPLANT
STAPLER SKIN PROX 35W (STAPLE) IMPLANT
STOCKINETTE M/LG 89821 (MISCELLANEOUS) ×2 IMPLANT
SUT ETHIBOND 0 36 GRN (SUTURE) ×2 IMPLANT
SUT SILK 2 0 (SUTURE) ×1
SUT SILK 2-0 18XBRD TIE 12 (SUTURE) ×1 IMPLANT
SUT SILK 3 0 (SUTURE) ×1
SUT SILK 3-0 18XBRD TIE 12 (SUTURE) ×1 IMPLANT
SUT VIC AB 0 CT1 36 (SUTURE) IMPLANT
SUT VIC AB 3-0 SH 27 (SUTURE) ×2
SUT VIC AB 3-0 SH 27X BRD (SUTURE) ×2 IMPLANT
SUT VICRYL PLUS ABS 0 54 (SUTURE) IMPLANT

## 2019-05-30 SURGICAL SUPPLY — 37 items
BALLN LUTONIX 018 5X100X130 (BALLOONS) ×4
BALLN LUTONIX 018 5X60X130 (BALLOONS) ×2
BALLN LUTONIX 018 6X60X130 (BALLOONS) ×4
BALLN LUTONIX DCB 6X60X130 (BALLOONS) ×2
BALLN ULTRVRSE 3X100X130C (BALLOONS) ×2
BALLN ULTRVRSE 3X80X75 (BALLOONS) ×2
BALLOON LUTONIX 018 5X100X130 (BALLOONS) ×2 IMPLANT
BALLOON LUTONIX 018 5X60X130 (BALLOONS) ×1 IMPLANT
BALLOON LUTONIX 018 6X60X130 (BALLOONS) ×2 IMPLANT
BALLOON LUTONIX DCB 6X60X130 (BALLOONS) ×1 IMPLANT
BALLOON ULTRVRSE 3X100X130C (BALLOONS) ×1 IMPLANT
BALLOON ULTRVRSE 3X80X75 (BALLOONS) ×1 IMPLANT
CANISTER PENUMBRA ENGINE (MISCELLANEOUS) ×2 IMPLANT
CATH BEACON 5 .035 65 RIM TIP (CATHETERS) ×2 IMPLANT
CATH INDIGO CAT6 KIT (CATHETERS) ×2 IMPLANT
CATH INDIGO SEP 6 (CATHETERS) ×2 IMPLANT
CATH INFUS 135CMX50CM (CATHETERS) ×2 IMPLANT
CATH PIG 70CM (CATHETERS) ×2 IMPLANT
CATH VERT 5FR 125CM (CATHETERS) ×2 IMPLANT
DEVICE PRESTO INFLATION (MISCELLANEOUS) ×2 IMPLANT
DEVICE STARCLOSE SE CLOSURE (Vascular Products) ×2 IMPLANT
GLIDEWIRE ADV .035X260CM (WIRE) ×2 IMPLANT
NEEDLE ENTRY 21GA 7CM ECHOTIP (NEEDLE) ×2 IMPLANT
PACK ANGIOGRAPHY (CUSTOM PROCEDURE TRAY) ×2 IMPLANT
SET INTRO CAPELLA COAXIAL (SET/KITS/TRAYS/PACK) ×2 IMPLANT
SHEATH ANL2 6FRX45 HC (SHEATH) ×2 IMPLANT
SHEATH BRITE TIP 5FRX11 (SHEATH) ×2 IMPLANT
SHEATH FLEXOR ANSEL2 7FRX45 (SHEATH) ×2 IMPLANT
SHEATH PINNACLE ST 6F 45CM (SHEATH) ×2 IMPLANT
STENT VIABAHN 5X7.5X120 (Permanent Stent) ×2 IMPLANT
STENT VIABAHN 6X50X120 (Permanent Stent) ×2 IMPLANT
STENT VIABAHN 7X50X120 (Permanent Stent) ×1 IMPLANT
STENT VIABAHN 7X5X120 7FR (Permanent Stent) ×1 IMPLANT
SYR MEDRAD MARK 7 150ML (SYRINGE) ×2 IMPLANT
TUBING CONTRAST HIGH PRESS 72 (TUBING) ×2 IMPLANT
WIRE J 3MM .035X145CM (WIRE) ×2 IMPLANT
WIRE RUNTHROUGH .014X300CM (WIRE) ×2 IMPLANT

## 2019-05-30 NOTE — H&P (Signed)
[facility] History & Physical Update  The patient was interviewed and re-examined.  The patient's previous History and Physical has been reviewed and is unchanged.  There is no change in the plan of care. We plan to proceed with the scheduled procedure.  [name], MD  05/30/2019, 11:16 AM

## 2019-05-30 NOTE — Op Note (Signed)
[place] [facility] Percutaneous Study/Intervention Procedural Note   Date of Surgery: 05/30/2019  Surgeon: [name]  Pre-operative Diagnosis: Atherosclerotic occlusive disease bilateral lower extremities with right lower extremity ischemia  Post-operative diagnosis: Same  Procedure(s) Performed: 1. Introduction catheter into right lower extremity 3rd order catheter placement  2. Contrast injection right lower extremity for distal runoff  3. Percutaneous transluminal angioplasty and stent placement at 2 locations in the femoral below-knee popliteal bypass 4. Percutaneous transluminal angioplasty and stent placement tibioperoneal trunk  5. Mechanical thrombectomy using the penumbra CAT 6 right femoral popliteal bypass and tibioperoneal trunk and proximal peroneal.             6.  Infusion 12 mg of TPA right femoral-popliteal bypass.             7.  Star close closure left common femoral arteriotomy  Anesthesia: Conscious sedation was administered under my direct supervision by the interventional radiology RN. IV Versed plus fentanyl were utilized. Continuous ECG, pulse oximetry and blood pressure was monitored throughout the entire procedure.  Conscious sedation was for a total of 2 hour 17 minutes.  Sheath: 7 [place] Ansell  Contrast: 160 cc  Fluoroscopy Time: 20.3 minutes  Indications: [name] presents with increasing pain of the right lower extremity.  Physical exam is consistent with likely thrombosis of her previously placed femoral below-knee popliteal bypass and recurrent ischemia of the right foot.  This suggests the patient is having limb threatening ischemia. The risks and benefits are reviewed all questions answered patient agrees to proceed right lower extremity angiography for limb salvage.  Procedure:[name] is a 68 y.o. y.o. female who was identified and appropriate  procedural time out was performed. The patient was then placed supine on the table and prepped and draped in the usual sterile fashion.   Ultrasound was placed in the sterile sleeve and the left groin was evaluated the left common femoral artery was echolucent and pulsatile indicating patency. Image was recorded for the permanent record and under real-time visualization a microneedle was inserted into the common femoral artery followed by the microwire and then the micro-sheath. A J-wire was then advanced through the micro-sheath and a 5 [place] sheath was then inserted over a J-wire. J-wire was then advanced and a 5 French pigtail catheter was positioned at the level of T12.  AP projection of the aorta was then obtained. Pigtail catheter was repositioned to above the bifurcation and a LAO view of the pelvis was obtained. Subsequently a rim catheter with the stiff angle Glidewire was used to cross the aortic bifurcation the catheter wire were advanced down into the right distal external iliac artery. Oblique view of the femoral bifurcation was then obtained and subsequently the wire was reintroduced and the pigtail catheter negotiated into the SFA representing third order catheter placement and then the catheter and wire were negotiated through the distal anastomosis. Distal runoff was then performed.  Diagnostic interpretation: The abdominal aorta is opacified the bolus injection of contrast.  There is diffuse calcifications noted but there are no hemodynamically significant lesions.  Bilateral nephrograms are noted they are normal in size and single renal arteries are identified without evidence of hemodynamically significant stenosis.  The origin of the left common iliac demonstrates a 60 to 70% stenosis.  The origin of the right common iliac demonstrates 30 to 40% stenosis.  Distally to these lesions the common iliacs are widely patent as are the internal and external iliac arteries.  The right  common femoral is patent as is the profunda femoris.  The proximal SFA is noted but it is quite small and occludes shortly after its origin.  The stump of the femoral below-knee popliteal bypass is identified.  However, the bypass is occluded approximately 1 cm from the anastomosis.  Distally there is single-vessel runoff via the peroneal down to the foot.  Based on these findings I elected to move forward with intervention and 4000 units of heparin was given and allowed to circulate and a 6 [place] Ansell sheath was advanced up and over the bifurcation and positioned in the femoral artery, later it was exchanged for a 6 [place] in order to perform thrombectomy and still after that it was upsized to a 7 [place] sheath for stenting purposes.  Straight catheter and stiff angle Glidewire were then negotiated down into the distal popliteal. Catheter was then advanced.   A infusion catheter with a 50 cm length was advanced across the entire course of the bypass and 12 mg of TPA was infused and allowed to dwell for 30 minutes.  The CAT 6 thrombectomy catheter was then prepped on the field the wire was exchanged for a 0.014 [employer] wire and using the CAT 6 the entire length of the bypass was treated.  Multiple passes were made both over the wire as well as with a separator.  Later after several interventions further treatment in the bypass graft at the knee level was performed.  Thrombectomy was then carried through the distal anastomosis and into the tibioperoneal trunk and proximal peroneal.  This thrombectomy uncovered 3 lesions there was a greater than 90% stenosis approximately 2 cm from the origin of the bypass.  Just above the knee level there was a greater than 90% stenosis associated with some residual clot.  At the distal anastomosis there was a greater than 90% stenosis which extended into the peroneal is a string sign representing a subtotal occlusion.  Again the peroneal is the single and only  runoff for the bypass.  Beginning proximally a 6 mm Lutonix balloon and subsequently a 5 mm Lutonix balloon was used to treat the lesion near the anastomosis.  Both inflations were four 1 minute in duration to 12 atm.  Follow-up imaging demonstrated persistent haziness with greater than 30% residual stenosis.  A 5 mm Lutonix was then used to treat the lesion at the knee level.  Inflation to 14 atm for 1 minute yielded greater than 50% residual stenosis and therefore a 6 mm x 50 mm via bond stent was deployed and postdilated with a 6 mm x 60 mm Lutonix balloon inflated to 12 atm for 1 minute.  Follow-up imaging demonstrated less than 5% residual stenosis.  Attention was then turned to the distal anastomosis where initially a 5 mm Lutonix balloon was inflated across this lesion including the peroneal.  This did not yield adequate results and a 5 mm x 75 mm via bond stent was deployed and postdilated using a second 5 mm x 100 mm Lutonix drug-eluting balloon inflated to 6 atm for 30 seconds.    After review of these images the sheath is pulled into the left external iliac oblique of the common femoral is obtained and a Star close device deployed. There no immediate Complications.  Findings:  The abdominal aorta is opacified the bolus injection of contrast.  There is diffuse calcifications noted but there are no hemodynamically significant lesions.  Bilateral nephrograms are noted they are normal in size and single  renal arteries are identified without evidence of hemodynamically significant stenosis.  The origin of the left common iliac demonstrates a 60 to 70% stenosis.  The origin of the right common iliac demonstrates 30 to 40% stenosis.  Distally to these lesions the common iliacs are widely patent as are the internal and external iliac arteries.  The right common femoral is patent as is the profunda femoris.  The proximal SFA is noted but it is quite small and occludes shortly after its origin.  The  stump of the femoral below-knee popliteal bypass is identified.  However, the bypass is occluded approximately 1 cm from the anastomosis.  Distally there is single-vessel runoff via the peroneal down to the foot.  Following administration of the TPA and then multiple passes and working with the penumbra mechanical thrombectomy device through the entire length of the bypass and into the peroneal there is now resolution of the thrombus but I have uncovered 3 critical lesions as noted above.  There is a proximal lesion noted in the bypass this is successfully treated with angioplasty and stent placement postdilated to 6 mm.  In the graft at the knee level again a greater than 80% stenosis is noted and this is treated with a 6 mm via bond stent postdilated to 5 mm.  Third, the tibioperoneal trunk distal anastomosis and proximal peroneal demonstrated greater than 80% stenosis and this is treated with a 5 mm Biobond stent postdilated to 5.  Single-vessel peroneal runoff is preserved.   Summary: Successful recanalization right lower extremity for limb salvage   Disposition: Patient was taken to the recovery room in stable condition having tolerated the procedure well.  Sara [name] 05/30/2019,2:16 PM

## 2019-05-30 NOTE — Op Note (Signed)
    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 Hezzie Bump  ANESTHESIA: general  ESTIMATED BLOOD LOSS: 100 cc  FINDING(S): Muscles bulging significantly upon opening of the fascia  SPECIMEN(S): None  INDICATIONS:   Sara [name] is a 68 y.o. female who presents with increasing pain and tightness of her calf and anterior compartment status post revascularization.  Her presentation is consistent with compartment syndrome.  She is therefore undergoing fasciotomies.  Risks and benefits of been reviewed as well as the indication with the patient.  All questions were answered she agrees to proceed..  DESCRIPTION: After full informed written consent was obtained from the patient, the patient was brought back to the operating room and placed supine upon the operating table.  Prior to induction, the patient received IV antibiotics.   After obtaining adequate anesthesia, the patient was then prepped and draped in the standard fashion for a right lower extremity fasciotomy.  A first assistant is required in order to allow for a safe and more efficient operation.  Duties include retraction of tissues to allow for optimal exposure, assisting with suture ligation of vessels as well as maintaining a clear field of view with suction as needed.  Further duties include assisting with patient positioning during the surgery as well as wound closure.  I believe that this procedure requires a first assistant in order for it to be performed at a level in keeping with the high standards of this institution.  Working on the medial side first a linear incision is made and carried down through the soft tissues to expose the fascia.  Fascia was then sharply with combination of scalpel and Metzenbaum  scissors.  The muscles immediately protrude bulging forth quite profoundly.  Bleeding points were then controlled Bovie cautery as well as 3-0 Vicryl.  The wound is then packed with a dry gauze and attention was then turned to the lateral and anterior compartments.  Linear incision is made between the lateral and anterior compartments.  Once again the fascia is exposed both compartments are opened using Metzenbaum scissors.  Once again the muscles protrude immediately quite profoundly.  Bleeding is controlled with 3-0 Vicryl and Bovie cautery.    Both wounds were then irrigated fibrillar is added where diffuse oozing is identified.  Mepitel is then placed across the open areas followed by fluffed gauze ABDs Kerlix and then an Ace wrap.    The patient tolerated this procedure well.   COMPLICATIONS: None  CONDITION: Sara [name] [facility]  Office: [phone]   05/30/2019, 8:15 PM

## 2019-05-30 NOTE — Anesthesia Preprocedure Evaluation (Signed)
Anesthesia Evaluation  Patient identified by MRN, date of birth, ID band Patient awake    Reviewed: Allergy & Precautions, NPO status , Patient's Chart, lab work & pertinent test results  Airway Mallampati: III       Dental  (+) Upper Dentures, Lower Dentures   Pulmonary former smoker,    Pulmonary exam normal        Cardiovascular hypertension, + CAD and + Peripheral Vascular Disease  Normal cardiovascular exam+ Valvular Problems/Murmurs MR      Neuro/Psych PSYCHIATRIC DISORDERS Depression  Neuromuscular disease    GI/Hepatic Neg liver ROS, hiatal hernia, PUD, GERD  ,  Endo/Other  negative endocrine ROS  Renal/GU negative Renal ROS  negative genitourinary   Musculoskeletal   Abdominal Normal abdominal exam  (+)   Peds negative pediatric ROS (+)  Hematology   Anesthesia Other Findings Past Medical History: No date: Depression No date: GERD (gastroesophageal reflux disease) No date: History of blood clots No date: Hyperlipidemia No date: Hypertension No date: Mild mitral regurgitation No date: Osteoporosis No date: Stomach ulcer No date: Vascular disease     Comment:  Sees Dr. Delana Meyer No date: Vertigo     Comment:  Last episode approx Aug 2015 No date: Wears dentures     Comment:  full upper  Reproductive/Obstetrics                             Anesthesia Physical Anesthesia Plan  ASA: III  Anesthesia Plan: General   Post-op Pain Management:    Induction: Intravenous, Rapid sequence and Cricoid pressure planned  PONV Risk Score and Plan:   Airway Management Planned: Oral ETT  Additional Equipment:   Intra-op Plan:   Post-operative Plan: Extubation in OR  Informed Consent: I have reviewed the patients History and Physical, chart, labs and discussed the procedure including the risks, benefits and alternatives for the proposed anesthesia with the patient or authorized  representative who has indicated his/her understanding and acceptance.     Dental advisory given  Plan Discussed with: CRNA and Surgeon  Anesthesia Plan Comments:         Anesthesia Quick Evaluation

## 2019-05-30 NOTE — Anesthesia Post-op Follow-up Note (Signed)
Anesthesia QCDR form completed.        

## 2019-05-30 NOTE — Anesthesia Procedure Notes (Signed)
Procedure Name: Intubation Date/Time: 05/30/2019 7:02 PM Performed by: Aline Brochure, CRNA Pre-anesthesia Checklist: Patient identified, Emergency Drugs available, Suction available and Patient being monitored Patient Re-evaluated:Patient Re-evaluated prior to induction Oxygen Delivery Method: Circle system utilized Preoxygenation: Pre-oxygenation with 100% oxygen Induction Type: IV induction Ventilation: Mask ventilation without difficulty Laryngoscope Size: McGraph and 3 Grade View: Grade I Tube type: Oral Tube size: 7.0 mm Number of attempts: 1 Airway Equipment and Method: Stylet and Video-laryngoscopy Placement Confirmation: ETT inserted through vocal cords under direct vision,  CO2 detector and breath sounds checked- equal and bilateral Secured at: 21 cm Tube secured with: Tape Dental Injury: Teeth and Oropharynx as per pre-operative assessment

## 2019-05-30 NOTE — Progress Notes (Addendum)
Patient complaining of right foot numbness.  Has c/o right leg pain since she returned from procedure for recovery.  Medicated with dilaudid 1mg  iv without relief.  Hezzie Bump, PA-C notified of the above assessment and in to assess patient.  She noted right calf edema and dr. Delana Meyer was in to assess the leg as well.  Heparin stopped for 1hr per dr schnier. Patient now NPO for now.  Dr. Delana Meyer to reassess right leg  Toradol 30mg  iv given for right leg pain per orders Calf circumference 37cm at present

## 2019-05-30 NOTE — Consult Note (Addendum)
ANTICOAGULATION CONSULT NOTE - Initial Consult  Pharmacy Consult for Heparin/ Warfarin Indication: s/p peripheral vascular catheterization   Allergies  Allergen Reactions  . Hydrocodone-Acetaminophen     Other reaction(s): Flushing  . Amoxicillin Other (See Comments)    Yeast infection  . Vicodin [Hydrocodone-Acetaminophen] Hives and Rash    Patient Measurements: Height: 5\' 3"  (160 cm) Weight: 125 lb (56.7 kg) IBW/kg (Calculated) : 52.4 Heparin Dosing Weight: 56.7 kg  Vital Signs: Temp: 98.4 F (36.9 C) (10/09 0928) Temp Source: Oral (10/09 0928) BP: 117/69 (10/09 1405) Pulse Rate: 97 (10/09 1405)  Labs: Recent Labs    05/30/19 0932  LABPROT 20.3*  INR 1.8*  CREATININE 0.74    Estimated Creatinine Clearance: 57.2 mL/min (by C-G formula based on SCr of 0.74 mg/dL).   Medical History: Past Medical History:  Diagnosis Date  . Depression   . GERD (gastroesophageal reflux disease)   . History of blood clots   . Hyperlipidemia   . Hypertension   . Mild mitral regurgitation   . Osteoporosis   . Stomach ulcer   . Vascular disease    Sees Dr. Delana Meyer  . Vertigo    Last episode approx Aug 2015  . Wears dentures    full upper    Medications:  Medications Prior to Admission  Medication Sig Dispense Refill Last Dose  . atorvastatin (LIPITOR) 40 MG tablet Take 40 mg by mouth at bedtime.    05/29/2019 at Unknown time  . calcium-vitamin D (OSCAL WITH D) 500-200 MG-UNIT tablet Take 2 tablets by mouth daily with breakfast.   05/29/2019 at Unknown time  . clopidogrel (PLAVIX) 75 MG tablet Take 1 tablet (75 mg total) by mouth daily. 90 tablet 3 05/28/2019 at Unknown time  . JANTOVEN 2 MG tablet TAKE 1 TABLET TWICE A DAY (Patient taking differently: Take 4 mg by mouth daily. ) 60 tablet 0 05/29/2019 at Unknown time  . losartan (COZAAR) 25 MG tablet Take 25 mg by mouth daily.    05/30/2019 at Unknown time  . omeprazole (PRILOSEC) 40 MG capsule Take 1 capsule (40 mg total) by  mouth daily. 90 capsule 3 05/30/2019 at Unknown time  . traMADol (ULTRAM) 50 MG tablet TAKE 1 TABLET BY MOUTH EVERY 6 HOURS FOR PAIN FILL 05/14/19   05/30/2019 at Unknown time  . venlafaxine XR (EFFEXOR-XR) 75 MG 24 hr capsule TAKE 1 CAPSULE BY MOUTH  DAILY WITH BREAKFAST 90 capsule 3 05/30/2019 at Unknown time  . ibandronate (BONIVA) 150 MG tablet Take 150 mg by mouth every 30 (thirty) days.    05/24/2019   Scheduled:  . atorvastatin  40 mg Oral QHS  . [START ON 05/31/2019] calcium-vitamin D  2 tablet Oral Q breakfast  . clopidogrel  75 mg Oral Daily  . fentaNYL      . fentaNYL      . fentaNYL      . heparin      . ibandronate  150 mg Oral Q30 days  . losartan  25 mg Oral Daily  . midazolam      . midazolam      . midazolam      . midazolam      . pantoprazole  40 mg Oral Daily   Infusions:   PRN: morphine injection Anti-infectives (From admission, onward)   Start     Dose/Rate Route Frequency Ordered Stop   05/30/19 0940  ceFAZolin (ANCEF) 2-4 GM/100ML-% IVPB    Note to Pharmacy: Despina Arias  :  cabinet override      05/30/19 0940 05/30/19 1122   05/29/19 2230  ceFAZolin (ANCEF) IVPB 2g/100 mL premix  Status:  Discontinued     2 g 200 mL/hr over 30 Minutes Intravenous  Once 05/29/19 2218 05/30/19 1413      Assessment: Pharmacy consulted to start heparin at 1430  today give half bolus and follow nomogram. No DOAC PTA. Plan to start warfarin for a goal of 2.5 for PAD.   Home dose: warfarin 4 mg daily.   Date INR Warfarin Dose  10/9 1.8 4 mg        Goal of Therapy:  Heparin level 0.3-0.7 units/ml Monitor platelets by anticoagulation protocol: Yes   Plan:  Heparin:  Give 1500 units bolus x 1 Start heparin infusion at 650 units/hr Check anti-Xa level in 6 hours and daily while on heparin Continue to monitor H&H and platelets  Warfarin INR is subtherapeutic. Will give a 4 mg today (home dose). Daily INR and CBC q3 days. Once INR at goal 2.5. for > 24 hours will  stop heparin. Pharmacy will continue to monitor and adjusted as necessary.   Oswald Hillock, PharmD, BCPS 05/30/2019,2:34 PM

## 2019-05-30 NOTE — Transfer of Care (Signed)
Immediate Anesthesia Transfer of Care Note  Patient: Sara [name]  Procedure(s) Performed: FASCIOTOMY (Right )  Patient Location: PACU  Anesthesia Type:General  Level of Consciousness: awake  Airway & Oxygen Therapy: Patient connected to face mask oxygen  Post-op Assessment: Post -op Vital signs reviewed and stable  Post vital signs: stable  Last Vitals:  Vitals Value Taken Time  BP 88/48 05/30/19 2008  Temp    Pulse 100 05/30/19 2008  Resp 16 05/30/19 2008  SpO2 100 % 05/30/19 2008    Last Pain:  Vitals:   05/30/19 1700  TempSrc:   PainSc: 10-Worst pain ever      Patients Stated Pain Goal: 0 ([id number])  Complications: No apparent anesthesia complications

## 2019-05-30 NOTE — Progress Notes (Signed)
Right calf measurement now 38.5cm dr. Delana Meyer and kim stegmayer, pa-c at bedside assessing patient.  Decision for fasciotomy has been made by dr. Delana Meyer.  purewick placed for urination.

## 2019-05-31 ENCOUNTER — Other Ambulatory Visit (INDEPENDENT_AMBULATORY_CARE_PROVIDER_SITE_OTHER): Payer: Self-pay | Admitting: Nurse Practitioner

## 2019-05-31 DIAGNOSIS — Z885 Allergy status to narcotic agent status: Secondary | ICD-10-CM | POA: Diagnosis not present

## 2019-05-31 DIAGNOSIS — M810 Age-related osteoporosis without current pathological fracture: Secondary | ICD-10-CM | POA: Diagnosis present

## 2019-05-31 DIAGNOSIS — T82868A Thrombosis of vascular prosthetic devices, implants and grafts, initial encounter: Secondary | ICD-10-CM | POA: Diagnosis present

## 2019-05-31 DIAGNOSIS — Z87891 Personal history of nicotine dependence: Secondary | ICD-10-CM | POA: Diagnosis not present

## 2019-05-31 DIAGNOSIS — I70229 Atherosclerosis of native arteries of extremities with rest pain, unspecified extremity: Secondary | ICD-10-CM

## 2019-05-31 DIAGNOSIS — Z7902 Long term (current) use of antithrombotics/antiplatelets: Secondary | ICD-10-CM | POA: Diagnosis not present

## 2019-05-31 DIAGNOSIS — Z880 Allergy status to penicillin: Secondary | ICD-10-CM | POA: Diagnosis not present

## 2019-05-31 DIAGNOSIS — Z8711 Personal history of peptic ulcer disease: Secondary | ICD-10-CM | POA: Diagnosis not present

## 2019-05-31 DIAGNOSIS — I10 Essential (primary) hypertension: Secondary | ICD-10-CM | POA: Diagnosis present

## 2019-05-31 DIAGNOSIS — E785 Hyperlipidemia, unspecified: Secondary | ICD-10-CM | POA: Diagnosis present

## 2019-05-31 DIAGNOSIS — Z86718 Personal history of other venous thrombosis and embolism: Secondary | ICD-10-CM | POA: Diagnosis not present

## 2019-05-31 DIAGNOSIS — Z8249 Family history of ischemic heart disease and other diseases of the circulatory system: Secondary | ICD-10-CM | POA: Diagnosis not present

## 2019-05-31 DIAGNOSIS — I70223 Atherosclerosis of native arteries of extremities with rest pain, bilateral legs: Secondary | ICD-10-CM | POA: Diagnosis present

## 2019-05-31 DIAGNOSIS — Z7901 Long term (current) use of anticoagulants: Secondary | ICD-10-CM | POA: Diagnosis not present

## 2019-05-31 DIAGNOSIS — I70233 Atherosclerosis of native arteries of right leg with ulceration of ankle: Secondary | ICD-10-CM | POA: Diagnosis not present

## 2019-05-31 DIAGNOSIS — Z79899 Other long term (current) drug therapy: Secondary | ICD-10-CM | POA: Diagnosis not present

## 2019-05-31 DIAGNOSIS — Z972 Presence of dental prosthetic device (complete) (partial): Secondary | ICD-10-CM | POA: Diagnosis not present

## 2019-05-31 DIAGNOSIS — Z20828 Contact with and (suspected) exposure to other viral communicable diseases: Secondary | ICD-10-CM | POA: Diagnosis present

## 2019-05-31 DIAGNOSIS — M79A21 Nontraumatic compartment syndrome of right lower extremity: Secondary | ICD-10-CM | POA: Diagnosis not present

## 2019-05-31 DIAGNOSIS — I998 Other disorder of circulatory system: Secondary | ICD-10-CM | POA: Diagnosis present

## 2019-05-31 DIAGNOSIS — K219 Gastro-esophageal reflux disease without esophagitis: Secondary | ICD-10-CM | POA: Diagnosis present

## 2019-05-31 DIAGNOSIS — R571 Hypovolemic shock: Secondary | ICD-10-CM | POA: Diagnosis not present

## 2019-05-31 DIAGNOSIS — I2510 Atherosclerotic heart disease of native coronary artery without angina pectoris: Secondary | ICD-10-CM | POA: Diagnosis present

## 2019-05-31 LAB — MAGNESIUM: Magnesium: 1.9 mg/dL (ref 1.7–2.4)

## 2019-05-31 LAB — CBC
HCT: 28 % — ABNORMAL LOW (ref 36.0–46.0)
Hemoglobin: 9.1 g/dL — ABNORMAL LOW (ref 12.0–15.0)
MCH: 28.2 pg (ref 26.0–34.0)
MCHC: 32.5 g/dL (ref 30.0–36.0)
MCV: 86.7 fL (ref 80.0–100.0)
Platelets: 147 10*3/uL — ABNORMAL LOW (ref 150–400)
RBC: 3.23 MIL/uL — ABNORMAL LOW (ref 3.87–5.11)
RDW: 15.9 % — ABNORMAL HIGH (ref 11.5–15.5)
WBC: 14.6 10*3/uL — ABNORMAL HIGH (ref 4.0–10.5)
nRBC: 0 % (ref 0.0–0.2)

## 2019-05-31 LAB — GLUCOSE, CAPILLARY: Glucose-Capillary: 164 mg/dL — ABNORMAL HIGH (ref 70–99)

## 2019-05-31 MED ORDER — OXYCODONE HCL 5 MG PO TABS
5.0000 mg | ORAL_TABLET | ORAL | Status: DC | PRN
  Administered 2019-06-01 – 2019-06-04 (×8): 5 mg via ORAL
  Filled 2019-05-31 (×8): qty 1

## 2019-05-31 MED ORDER — MORPHINE SULFATE (PF) 2 MG/ML IV SOLN
2.0000 mg | Freq: Once | INTRAVENOUS | Status: AC
  Administered 2019-05-31: 20:00:00 2 mg via INTRAVENOUS
  Filled 2019-05-31: qty 1

## 2019-05-31 MED ORDER — MORPHINE SULFATE (PF) 2 MG/ML IV SOLN
2.0000 mg | INTRAVENOUS | Status: DC | PRN
  Administered 2019-05-31 – 2019-06-01 (×3): 2 mg via INTRAVENOUS
  Filled 2019-05-31 (×4): qty 1

## 2019-05-31 MED ORDER — VENLAFAXINE HCL ER 75 MG PO CP24
75.0000 mg | ORAL_CAPSULE | Freq: Every day | ORAL | Status: DC
  Administered 2019-05-31 – 2019-06-07 (×8): 75 mg via ORAL
  Filled 2019-05-31 (×8): qty 1

## 2019-05-31 MED ORDER — HYDROMORPHONE HCL 1 MG/ML IJ SOLN
0.5000 mg | INTRAMUSCULAR | Status: AC | PRN
  Administered 2019-05-31: 0.5 mg via INTRAVENOUS
  Filled 2019-05-31: qty 1

## 2019-05-31 MED ORDER — ALUM & MAG HYDROXIDE-SIMETH 200-200-20 MG/5ML PO SUSP: 15.0000 mL | ORAL | Status: DC | PRN

## 2019-05-31 MED ORDER — ENOXAPARIN SODIUM 30 MG/0.3ML ~~LOC~~ SOLN: 30.0000 mg | SUBCUTANEOUS | Status: DC

## 2019-05-31 MED ORDER — OXYCODONE HCL 5 MG PO TABS
5.0000 mg | ORAL_TABLET | ORAL | Status: DC | PRN
  Administered 2019-05-31 (×3): 10 mg via ORAL
  Filled 2019-05-31: qty 1
  Filled 2019-05-31 (×3): qty 2

## 2019-05-31 MED ORDER — HYDROMORPHONE HCL 1 MG/ML IJ SOLN
1.0000 mg | INTRAMUSCULAR | Status: AC | PRN
  Administered 2019-05-31: 1 mg via INTRAVENOUS
  Filled 2019-05-31: qty 1

## 2019-05-31 MED ORDER — DOCUSATE SODIUM 100 MG PO CAPS
100.0000 mg | ORAL_CAPSULE | Freq: Every day | ORAL | Status: DC
  Administered 2019-05-31 – 2019-06-05 (×6): 100 mg via ORAL
  Filled 2019-05-31 (×6): qty 1

## 2019-05-31 MED ORDER — RISAQUAD PO CAPS
2.0000 | ORAL_CAPSULE | Freq: Three times a day (TID) | ORAL | Status: DC
  Administered 2019-05-31 – 2019-06-07 (×21): 2 via ORAL
  Filled 2019-05-31 (×25): qty 2

## 2019-05-31 MED ORDER — OXYCODONE HCL ER 10 MG PO T12A
10.0000 mg | EXTENDED_RELEASE_TABLET | Freq: Two times a day (BID) | ORAL | Status: DC
  Administered 2019-05-31 – 2019-06-05 (×9): 10 mg via ORAL
  Filled 2019-05-31 (×9): qty 1

## 2019-05-31 MED ORDER — BACID PO TABS
2.0000 | ORAL_TABLET | Freq: Three times a day (TID) | ORAL | Status: DC
  Filled 2019-05-31 (×3): qty 2

## 2019-05-31 MED ORDER — SENNOSIDES-DOCUSATE SODIUM 8.6-50 MG PO TABS
2.0000 | ORAL_TABLET | Freq: Every evening | ORAL | Status: DC | PRN
  Administered 2019-06-05: 2 via ORAL
  Filled 2019-05-31: qty 2

## 2019-05-31 MED ORDER — POLYETHYLENE GLYCOL 3350 17 G PO PACK
17.0000 g | PACK | Freq: Every day | ORAL | Status: DC
  Administered 2019-05-31 – 2019-06-07 (×7): 17 g via ORAL
  Filled 2019-05-31 (×7): qty 1

## 2019-05-31 MED ORDER — CLINDAMYCIN HCL 150 MG PO CAPS
300.0000 mg | ORAL_CAPSULE | Freq: Three times a day (TID) | ORAL | Status: AC
  Administered 2019-05-31 (×3): 300 mg via ORAL
  Filled 2019-05-31 (×4): qty 2

## 2019-05-31 MED ORDER — POTASSIUM CHLORIDE CRYS ER 20 MEQ PO TBCR: 20.0000 meq | EXTENDED_RELEASE_TABLET | Freq: Every day | ORAL | Status: DC | PRN

## 2019-05-31 MED ORDER — SENNOSIDES-DOCUSATE SODIUM 8.6-50 MG PO TABS: 1.0000 | ORAL_TABLET | Freq: Every evening | ORAL | Status: DC | PRN

## 2019-05-31 MED ORDER — SODIUM CHLORIDE 0.9 % IV SOLN
0.0000 ug/min | INTRAVENOUS | Status: DC
  Administered 2019-05-31: 28 ug/min via INTRAVENOUS
  Administered 2019-05-31: 36 ug/min via INTRAVENOUS
  Administered 2019-05-31: 10 ug/min via INTRAVENOUS
  Administered 2019-05-31: 40 ug/min via INTRAVENOUS
  Administered 2019-05-31: 50 ug/min via INTRAVENOUS
  Administered 2019-05-31: 46 ug/min via INTRAVENOUS
  Administered 2019-06-01: 06:00:00 22 ug/min via INTRAVENOUS
  Filled 2019-05-31: qty 1
  Filled 2019-05-31 (×7): qty 10

## 2019-05-31 MED ORDER — MAGNESIUM SULFATE 2 GM/50ML IV SOLN: 2.0000 g | Freq: Every day | INTRAVENOUS | Status: DC | PRN

## 2019-05-31 MED ORDER — MORPHINE SULFATE (PF) 2 MG/ML IV SOLN
2.0000 mg | Freq: Once | INTRAVENOUS | Status: AC
  Administered 2019-05-31: 2 mg via INTRAVENOUS
  Filled 2019-05-31: qty 1

## 2019-05-31 MED ORDER — IBANDRONATE SODIUM 150 MG PO TABS: 150.0000 mg | ORAL_TABLET | ORAL | Status: DC

## 2019-05-31 MED ORDER — ONDANSETRON HCL 4 MG/2ML IJ SOLN: 4.0000 mg | Freq: Four times a day (QID) | INTRAMUSCULAR | Status: DC | PRN

## 2019-05-31 MED ORDER — ENOXAPARIN SODIUM 40 MG/0.4ML ~~LOC~~ SOLN
40.0000 mg | SUBCUTANEOUS | Status: DC
  Administered 2019-05-31 – 2019-06-03 (×4): 40 mg via SUBCUTANEOUS
  Filled 2019-05-31 (×4): qty 0.4

## 2019-05-31 MED ORDER — SORBITOL 70 % SOLN
30.0000 mL | Freq: Every day | Status: DC | PRN
  Filled 2019-05-31: qty 30

## 2019-05-31 MED ORDER — CHLORHEXIDINE GLUCONATE CLOTH 2 % EX PADS
6.0000 | MEDICATED_PAD | Freq: Every day | CUTANEOUS | Status: DC
  Administered 2019-05-31: 6 via TOPICAL

## 2019-05-31 MED ORDER — VENLAFAXINE HCL ER 75 MG PO CP24: 75.0000 mg | ORAL_CAPSULE | Freq: Every day | ORAL | Status: DC

## 2019-05-31 MED ORDER — SODIUM CHLORIDE 0.9 % IV SOLN
INTRAVENOUS | Status: AC
  Administered 2019-05-31: 02:00:00 via INTRAVENOUS

## 2019-05-31 MED ORDER — PHENYLEPHRINE HCL-NACL 10-0.9 MG/250ML-% IV SOLN
0.0000 ug/min | INTRAVENOUS | Status: DC
  Administered 2019-05-31: 04:00:00 50 ug/min via INTRAVENOUS
  Filled 2019-05-31: qty 250

## 2019-05-31 NOTE — Progress Notes (Signed)
Subjective: Interval History: has complaints numbness of right foot. She is able to move all toes well however. She had questions about what happened and I explained as best as possible. She also was requesting her medications be restarted given her medical conditions.  Objective: Vital signs in last 24 hours: Temp:  [97.3 F (36.3 C)-98.7 F (37.1 C)] 98.6 F (37 C) (10/10 0800) Pulse Rate:  [84-125] 95 (10/10 1000) Resp:  [10-29] 20 (10/10 1000) BP: (80-133)/(42-89) 102/86 (10/10 1000) SpO2:  [90 %-100 %] 98 % (10/10 1000)  Intake/Output from previous day: 10/09 0701 - 10/10 0700 In: 1682.6 [I.V.:632.6; Blood:1050] Out: 1000 [Urine:1000] Intake/Output this shift: Total I/O In: 707.4 [P.O.:240; I.V.:467.4] Out: 250 [Urine:250]  General appearance: alert, cooperative and appears stated age Extremities: mild edema no significant pitting Skin: Skin color, texture, turgor normal. No rashes or lesions Incision/Wound: viable, muscle visible through fasciotomy wounds; some small hematoma but no obvious bleeding; tender to palpation  Lab Results: Recent Labs    05/30/19 2120  WBC 18.9*  HGB 7.2*  HCT 24.1*  PLT 188   BMET Recent Labs    05/30/19 0932  BUN 11  CREATININE 0.74    Studies/Results: US Venous Img Lower Unilateral Right  Result Date: 05/27/2019 CLINICAL DATA:  Right shin pain EXAM: RIGHT LOWER EXTREMITY VENOUS DOPPLER ULTRASOUND TECHNIQUE: Gray-scale sonography with graded compression, as well as color Doppler and duplex ultrasound were performed to evaluate the lower extremity deep venous systems from the level of the common femoral vein and including the common femoral, femoral, profunda femoral, popliteal and calf veins including the posterior tibial, peroneal and gastrocnemius veins when visible. The superficial great saphenous vein was also interrogated. Spectral Doppler was utilized to evaluate flow at rest and with distal augmentation maneuvers in the common  femoral, femoral and popliteal veins. COMPARISON:  None. FINDINGS: Contralateral Common Femoral Vein: Respiratory phasicity is normal and symmetric with the symptomatic side. No evidence of thrombus. Normal compressibility. Common Femoral Vein: No evidence of thrombus. Normal compressibility, respiratory phasicity and response to augmentation. Saphenofemoral Junction: No evidence of thrombus. Normal compressibility and flow on color Doppler imaging. Profunda Femoral Vein: No evidence of thrombus. Normal compressibility and flow on color Doppler imaging. Femoral Vein: No evidence of thrombus. Normal compressibility, respiratory phasicity and response to augmentation. Popliteal Vein: No evidence of thrombus. Normal compressibility, respiratory phasicity and response to augmentation. Calf Veins: No evidence of thrombus. Normal compressibility and flow on color Doppler imaging. Superficial Great Saphenous Vein: No evidence of thrombus. Normal compressibility. Venous Reflux:  None. 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 There is thrombus within the graft. This is of unknown chronicity. Of note, on the CT dated February 05, 2016 the graft was occluded. IMPRESSION: 1. No DVT. 2. 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 Electronically Signed   By: Constance Holster M.D.   On: 05/27/2019 14:42   Anti-infectives: Anti-infectives (From admission, onward)   Start     Dose/Rate Route Frequency Ordered Stop   05/31/19 0200  clindamycin (CLEOCIN) capsule 300 mg     300 mg Oral Every 8 hours 05/31/19 0139 06/01/19 0159   05/31/19 0000  clindamycin (CLEOCIN) IVPB 300 mg  Status:  Discontinued    Note to Pharmacy: To be given in OR   300 mg 100 mL/hr over 30 Minutes Intravenous  Once 05/30/19 1716 05/31/19 0134   05/30/19 0940  ceFAZolin (ANCEF) 2-4 GM/100ML-%  IVPB    Note to Pharmacy: Despina Arias  : cabinet override      05/30/19 0940 05/30/19 1122   05/29/19 2230  ceFAZolin (ANCEF) IVPB 2g/100 mL premix  Status:  Discontinued     2 g 200 mL/hr over 30 Minutes Intravenous  Once 05/29/19 2218 05/30/19 1413      Assessment/Plan: s/p Procedure(s): FASCIOTOMY (Right) Patient has labs pending s/p transfusion after surgery. Transfuse as needed for hemoglobin less than 7. Wean pressor agent to off. Out of bed to chair is okay if the leg can be elevated.  Daily dressing changes starting tomorrow. Diet as tolerated. Some home meds have been reordered but others will have to wait. Ok to get enoxaparin today but hold clopidogrel.   LOS: 0 days   Sara [name] 05/31/2019, 10:43 AM

## 2019-05-31 NOTE — Anesthesia Postprocedure Evaluation (Signed)
Anesthesia Post Note  Patient: Sara [name]  Procedure(s) Performed: FASCIOTOMY (Right )  Patient location during evaluation: SICU Anesthesia Type: General Level of consciousness: awake and alert and oriented Pain management: pain level not controlled (Currently with significant pain at the back of her operative leg.) Vital Signs Assessment: post-procedure vital signs reviewed and stable Respiratory status: spontaneous breathing, respiratory function stable and nonlabored ventilation Cardiovascular status: stable Postop Assessment: no apparent nausea or vomiting Anesthetic complications: no     Last Vitals:  Vitals:   05/31/19 1400 05/31/19 1500  BP: 110/60 125/64  Pulse: 95 91  Resp: (!) 29 16  Temp:    SpO2: 97% 94%    Last Pain:  Vitals:   05/31/19 1506  TempSrc:   PainSc: 10-Worst pain ever                 Martha Clan

## 2019-05-31 NOTE — Progress Notes (Signed)
PT Cancellation Note  Patient Details Name: Sara [name] MRN: [id number] DOB: 1950-12-04   Cancelled Treatment:    Reason Eval/Treat Not Completed: Patient declined, no reason specified. Chart reviewed and RN consulted. Attempted to see patient however she is having severe pain in RLE and refuses to get to chair at this time. Pt also reporting persistent numbness in RLE. RN notified of pain.   Lyndel Safe  PT, DPT, GCS  , 05/31/2019, 2:18 PM

## 2019-05-31 NOTE — Progress Notes (Signed)
PHARMACIST - PHYSICIAN COMMUNICATION  CONCERNING: P&T Medication Policy Regarding Oral Bisphosphonates  RECOMMENDATION: Your order for alendronate (Fosamax), ibandronate (Boniva), or risedronate (Actonel) has been discontinued at this time.  If the patient's post-hospital medical condition warrants safe use of this class of drugs, please resume the pre-hospital regimen upon discharge.  DESCRIPTION:  Alendronate (Fosamax), ibandronate (Boniva), and risedronate (Actonel) can cause severe esophageal erosions in patients who are unable to remain upright at least 30 minutes after taking this medication.   Since brief interruptions in therapy are thought to have minimal impact on bone mineral density, the [facility] has established that bisphosphonate orders should be routinely discontinued during hospitalization.   Ibandronate is once monthly dosing. Patient  reports last dose 05/24/19.  To override this safety policy and permit administration of Boniva, Fosamax, or Actonel in the hospital, prescribers must write "DO NOT HOLD" in the comments section when placing the order for this class of medications.  Pernell Dupre, PharmD, BCPS Clinical Pharmacist 05/31/2019 10:45 AM

## 2019-05-31 NOTE — Consult Note (Signed)
PULMONARY / CRITICAL CARE MEDICINE  Name: Sara [name] MRN: [id number] DOB: [birth year]    LOS: 0  Referring Provider:  Dr Delana Meyer Reason for Referral: Hypovolemic shock s/p 4 compartment fasciotomy right lower extremity  HPI:  68 y/o female with a medical history as indicated below admitted with atherosclerotic occlusive disease bilateral lower extremities with right lower extremity ischemia, underwent revascularization procedure in the morning of 05/30/2019 and later in the day developed compartment syndrome right lower extremity requiring a 4 compartment fasciotomy right lower extremity. Post-procedure, patient became hypotensive requiring pressors. PCCM was consulted for BP management. She is fully awake and beside RLE pain, she has no other complaints.  She is on Neosynephrine.   Past Medical History:  Diagnosis Date  . Depression   . GERD (gastroesophageal reflux disease)   . History of blood clots   . Hyperlipidemia   . Hypertension   . Mild mitral regurgitation   . Osteoporosis   . Stomach ulcer   . Vascular disease    Sees Dr. Delana Meyer  . Vertigo    Last episode approx Aug 2015  . Wears dentures    full upper   Past Surgical History:  Procedure Laterality Date  . ADENOIDECTOMY    . BREAST CYST ASPIRATION Left   . CARDIAC CATHETERIZATION  02/15/2017   [facility]  . COLONOSCOPY WITH PROPOFOL N/A 10/22/2017   Procedure: COLONOSCOPY WITH PROPOFOL;  Surgeon: Lucilla Lame, MD;  Location: [place];  Service: Endoscopy;  Laterality: N/A;  specimens not taken--pt on Plavix will be brought back in after 7 days off med  . COLONOSCOPY WITH PROPOFOL N/A 11/05/2017   Procedure: COLONOSCOPY WITH PROPOFOL;  Surgeon: Lucilla Lame, MD;  Location: [place];  Service: Endoscopy;  Laterality: N/A;  . ESOPHAGOGASTRODUODENOSCOPY N/A 12/21/2014   Procedure: ESOPHAGOGASTRODUODENOSCOPY (EGD);  Surgeon: Lucilla Lame, MD;  Location: [place];  Service: Gastroenterology;   Laterality: N/A;  . ESOPHAGOGASTRODUODENOSCOPY (EGD) WITH PROPOFOL N/A 02/08/2016   Procedure: ESOPHAGOGASTRODUODENOSCOPY (EGD) WITH PROPOFOL;  Surgeon: Lucilla Lame, MD;  Location: [facility];  Service: Endoscopy;  Laterality: N/A;  . HEMORROIDECTOMY  2014  . PERIPHERAL VASCULAR CATHETERIZATION N/A 02/09/2016   Procedure: Abdominal Aortogram w/Lower Extremity;  Surgeon: [name], MD;  Location: [place] [facility];  Service: Cardiovascular;  Laterality: N/A;  . PERIPHERAL VASCULAR CATHETERIZATION Right 02/10/2016   Procedure: Lower Extremity Angiography;  Surgeon: [name], MD;  Location: [place] [facility];  Service: Cardiovascular;  Laterality: Right;  . POLYPECTOMY  11/05/2017   Procedure: POLYPECTOMY;  Surgeon: Lucilla Lame, MD;  Location: [place];  Service: Endoscopy;;  . TONSILLECTOMY    . VASCULAR SURGERY  [phone]   Fem-Pop Bypass   No current facility-administered medications on file prior to encounter.    Current Outpatient Medications on File Prior to Encounter  Medication Sig  . atorvastatin (LIPITOR) 40 MG tablet Take 40 mg by mouth at bedtime.   . calcium-vitamin D (OSCAL WITH D) 500-200 MG-UNIT tablet Take 2 tablets by mouth daily with breakfast.  . clopidogrel (PLAVIX) 75 MG tablet Take 1 tablet (75 mg total) by mouth daily.  Marland Kitchen JANTOVEN 2 MG tablet TAKE 1 TABLET TWICE A DAY (Patient taking differently: Take 4 mg by mouth daily. )  . losartan (COZAAR) 25 MG tablet Take 25 mg by mouth daily.   Marland Kitchen omeprazole (PRILOSEC) 40 MG capsule Take 1 capsule (40 mg total) by mouth daily.  . traMADol (ULTRAM) 50 MG tablet TAKE 1 TABLET  BY MOUTH EVERY 6 HOURS FOR PAIN FILL 05/14/19  . venlafaxine XR (EFFEXOR-XR) 75 MG 24 hr capsule TAKE 1 CAPSULE BY MOUTH  DAILY WITH BREAKFAST  . ibandronate (BONIVA) 150 MG tablet Take 150 mg by mouth every 30 (thirty) days.     Allergies Allergies  Allergen Reactions  . Hydrocodone-Acetaminophen     Other reaction(s):  Flushing  . Amoxicillin Other (See Comments)    Yeast infection  . Vicodin [Hydrocodone-Acetaminophen] Hives and Rash    Family History Family History  Problem Relation Age of Onset  . CVA Mother   . Heart attack Mother   . Cancer Father        colon cancer  . Colon cancer Father   . Heart disease Maternal Uncle   . Breast cancer Neg Hx    Social History  reports that she quit smoking about 27 years ago. Her smoking use included cigarettes. She has a 50.00 pack-year smoking history. She has never used smokeless tobacco. She reports current drug use. She reports that she does not drink alcohol.  Review Of Systems:  Constitutional: Negative for fever and chills.  HENT: Negative for congestion and rhinorrhea.  Eyes: Negative for redness and visual disturbance.  Respiratory: Negative for shortness of breath and wheezing.  Cardiovascular: Negative for chest pain and palpitations.  Gastrointestinal: Negative  for nausea , vomiting and abdominal pain and  Loose stools Genitourinary: Negative for dysuria and urgency.  Endocrine: Denies polyuria, polyphagia and heat intolerance Musculoskeletal: Positive for RLE pain, numbness and swelling Skin: Negative for pallor and wound.  Neurological: Negative for dizziness and headaches   VITAL SIGNS: BP (!) 94/51 (BP Location: Right Arm)   Pulse (!) 105   Temp 98.5 F (36.9 C) (Temporal)   Resp 18   Ht 5\' 3"  (1.6 m)   Wt 56.7 kg   SpO2 96%   BMI 22.14 kg/m   HEMODYNAMICS:    VENTILATOR SETTINGS:    INTAKE / OUTPUT: No intake/output data recorded.  PHYSICAL EXAMINATION: General: well nourished, well developed, in mild sitress HEENT:  PERRLA, no JVD Neuro: AAOX 4, no focal deficits, sensation in RLE intact Cardiovascular:  RRR, S1/S2, +2 pulses in LLE and BLUE, +1 in RLE, RLE with intact dressing and moderate swelling, cold to touch Lungs: CTAB Abdomen:  ND/NT, +BSX4 Musculoskeletal:  +ROM in BLUE, RLE immobilized with  intact dressing and limited ROM Skin:  Warm and dry, surgical incision RLE with dressing  LABS:  BMET Recent Labs  Lab 05/30/19 0932  BUN 11  CREATININE 0.74    Electrolytes No results for input(s): CALCIUM, MG, PHOS in the last 168 hours.  CBC Recent Labs  Lab 05/30/19 2120  WBC 18.9*  HGB 7.2*  HCT 24.1*  PLT 188    Coag's Recent Labs  Lab 05/27/19 1319 05/30/19 0932  INR 1.2 1.8*    Sepsis Markers No results for input(s): LATICACIDVEN, PROCALCITON, O2SATVEN in the last 168 hours.  ABG No results for input(s): PHART, PCO2ART, PO2ART in the last 168 hours.  Liver Enzymes No results for input(s): AST, ALT, ALKPHOS, BILITOT, ALBUMIN in the last 168 hours.  Cardiac Enzymes No results for input(s): TROPONINI, PROBNP in the last 168 hours.  Glucose Recent Labs  Lab 05/31/19 0142  GLUCAP 164*    Imaging No results found.  ANTIBIOTICS: Clindamycin for surgical prophylaxis  SIGNIFICANT EVENTS: 10/09>OR for revascularization>taken back for compartment syndrome s/p re-vascularization and admitted to the icu post procedure for hypotension  LINES/TUBES:  PIVs  DISCUSSION: 68 Y/O female presenting with hypovolemic shock s/p revascularization and subsequent repair of compartment syndrome.    ASSESSMENT  Hypovolemic shock Compartment syndrome right lower extremity status post revascularization now s/p 4 compartment fasciotomy right lower extremity  Atherosclerotic occlusive disease bilateral lower extremities with right lower extremity ischemia  PLAN IV fluids, blood products and pressors to maintain MAP GE 65 Hemodynamic monitoring per ICU protocol Repeat labs and transfuse for Hemoglobin<7 Pain management with oxycodone and dilaudid prn Antibiotics as above for prophylaxis Monitor for bleeding and increased RLE swelling Rest of the treatment plan per vascular surgery  Best Practice: Code Status:Full code Diet: heart healthy GI  prophylaxis:PPI VTE prophylaxis:  SQ lovenox and SCDs  FAMILY/Patient updated on current treatment plan    Derwood Becraft S. [facility] ANP-BC Pulmonary and Critical Care Medicine [facility] Pager [phone] or [phone]  NB: This document was prepared using Dragon voice recognition software and may include unintentional dictation errors.    05/31/2019, 2:42 AM

## 2019-06-01 LAB — COMPREHENSIVE METABOLIC PANEL
ALT: 13 U/L (ref 0–44)
AST: 36 U/L (ref 15–41)
Albumin: 2.2 g/dL — ABNORMAL LOW (ref 3.5–5.0)
Alkaline Phosphatase: 29 U/L — ABNORMAL LOW (ref 38–126)
Anion gap: 4 — ABNORMAL LOW (ref 5–15)
BUN: <5 mg/dL — ABNORMAL LOW (ref 8–23)
CO2: 26 mmol/L (ref 22–32)
Calcium: 7.3 mg/dL — ABNORMAL LOW (ref 8.9–10.3)
Chloride: 109 mmol/L (ref 98–111)
Creatinine, Ser: 0.53 mg/dL (ref 0.44–1.00)
GFR calc Af Amer: >60 mL/min (ref >60)
GFR calc non Af Amer: >60 mL/min (ref >60)
Glucose, Bld: 125 mg/dL — ABNORMAL HIGH (ref 70–99)
Potassium: 3.8 mmol/L (ref 3.5–5.1)
Sodium: 139 mmol/L (ref 135–145)
Total Bilirubin: 0.3 mg/dL (ref 0.3–1.2)
Total Protein: 4.4 g/dL — ABNORMAL LOW (ref 6.5–8.1)

## 2019-06-01 LAB — HEMOGLOBIN AND HEMATOCRIT, BLOOD
HCT: 30.7 % — ABNORMAL LOW (ref 36.0–46.0)
Hemoglobin: 9.9 g/dL — ABNORMAL LOW (ref 12.0–15.0)

## 2019-06-01 LAB — PHOSPHORUS: Phosphorus: 2.3 mg/dL — ABNORMAL LOW (ref 2.5–4.6)

## 2019-06-01 LAB — LACTIC ACID, PLASMA: Lactic Acid, Venous: 0.8 mmol/L (ref 0.5–1.9)

## 2019-06-01 LAB — CBC
HCT: 23.3 % — ABNORMAL LOW (ref 36.0–46.0)
Hemoglobin: 7.6 g/dL — ABNORMAL LOW (ref 12.0–15.0)
MCH: 27.8 pg (ref 26.0–34.0)
MCHC: 32.6 g/dL (ref 30.0–36.0)
MCV: 85.3 fL (ref 80.0–100.0)
Platelets: 128 10*3/uL — ABNORMAL LOW (ref 150–400)
RBC: 2.73 MIL/uL — ABNORMAL LOW (ref 3.87–5.11)
RDW: 16.1 % — ABNORMAL HIGH (ref 11.5–15.5)
WBC: 9.9 10*3/uL (ref 4.0–10.5)
nRBC: 0 % (ref 0.0–0.2)

## 2019-06-01 LAB — PROTIME-INR
INR: 2.2 — ABNORMAL HIGH (ref 0.8–1.2)
Prothrombin Time: 24.5 seconds — ABNORMAL HIGH (ref 11.4–15.2)

## 2019-06-01 LAB — PREPARE RBC (CROSSMATCH)

## 2019-06-01 LAB — PROCALCITONIN: Procalcitonin: <0.1 ng/mL

## 2019-06-01 MED ORDER — SODIUM CHLORIDE 0.9% IV SOLUTION
Freq: Once | INTRAVENOUS | Status: AC
  Administered 2019-06-01: 06:00:00 via INTRAVENOUS

## 2019-06-01 MED ORDER — SODIUM CHLORIDE 0.9 % IV BOLUS
2000.0000 mL | Freq: Once | INTRAVENOUS | Status: AC
  Administered 2019-06-01: 2000 mL via INTRAVENOUS

## 2019-06-01 MED ORDER — CHLORHEXIDINE GLUCONATE CLOTH 2 % EX PADS
6.0000 | MEDICATED_PAD | Freq: Every day | CUTANEOUS | Status: DC
  Administered 2019-06-01 – 2019-06-04 (×3): 6 via TOPICAL

## 2019-06-01 MED ORDER — K PHOS MONO-SOD PHOS DI & MONO 155-852-130 MG PO TABS
500.0000 mg | ORAL_TABLET | ORAL | Status: AC
  Administered 2019-06-01 (×2): 500 mg via ORAL
  Filled 2019-06-01 (×2): qty 2

## 2019-06-01 MED ORDER — MORPHINE SULFATE (PF) 4 MG/ML IV SOLN
4.0000 mg | Freq: Once | INTRAVENOUS | Status: AC
  Administered 2019-06-01: 4 mg via INTRAVENOUS

## 2019-06-01 MED ORDER — MORPHINE SULFATE (PF) 2 MG/ML IV SOLN
2.0000 mg | INTRAVENOUS | Status: DC | PRN
  Administered 2019-06-01 – 2019-06-03 (×14): 2 mg via INTRAVENOUS
  Filled 2019-06-01 (×13): qty 1

## 2019-06-01 NOTE — Progress Notes (Signed)
HYPOVOLUMIC SHOCK RESOLVING WITH IVF's and BLOOD PRODUCTS  Patient off pressors, pain meds as needed Patient to be made FC status.    Corrin Parker, M.D.  [facility]  Medical Director [place] Director [facility] Cardio-Pulmonary Department

## 2019-06-01 NOTE — Progress Notes (Signed)
Spoke with Juanda Crumble MD regarding pts bleeding from Fasciotomy site on interior sided. Orders given to transfuse 2 units of PRBC. Lab called and notified of new orders. Charles MD on his way to see patient.

## 2019-06-01 NOTE — Progress Notes (Addendum)
Subjective: Interval History: has complaints ongoing bleeding through dressing. Pain still present but manageable. She is still on pressors.   Objective: Vital signs in last 24 hours: Temp:  [98 F (36.7 C)-99 F (37.2 C)] 98.7 F (37.1 C) (10/11 0400) Pulse Rate:  [78-117] 87 (10/11 0600) Resp:  [12-34] 23 (10/11 0600) BP: (78-125)/(43-86) 106/54 (10/11 0600) SpO2:  [90 %-100 %] 96 % (10/11 0600)  Intake/Output from previous day: 10/10 0701 - 10/11 0700 In: 2223.9 [P.O.:720; I.V.:1503.9] Out: 3100 [Urine:3100] Intake/Output this shift: No intake/output data recorded.  General appearance: alert, cooperative and mild distress Extremities: mild edema, no cyanosis Skin: pale Neurologic: Grossly normal Incision/Wound: healthy looking muscle with hematoma but no active bleeding  Lab Results: Recent Labs    05/31/19 1021 06/01/19 0352  WBC 14.6* 9.9  HGB 9.1* 7.6*  HCT 28.0* 23.3*  PLT 147* 128*   BMET Recent Labs    05/30/19 0932 06/01/19 0352  NA  --  139  K  --  3.8  CL  --  109  CO2  --  26  GLUCOSE  --  125*  BUN 11 <5*  CREATININE 0.74 0.53  CALCIUM  --  7.3*    Studies/Results: US Venous Img Lower Unilateral Right  Result Date: 05/27/2019 CLINICAL DATA:  Right shin pain EXAM: RIGHT LOWER EXTREMITY VENOUS DOPPLER ULTRASOUND TECHNIQUE: Gray-scale sonography with graded compression, as well as color Doppler and duplex ultrasound were performed to evaluate the lower extremity deep venous systems from the level of the common femoral vein and including the common femoral, femoral, profunda femoral, popliteal and calf veins including the posterior tibial, peroneal and gastrocnemius veins when visible. The superficial great saphenous vein was also interrogated. Spectral Doppler was utilized to evaluate flow at rest and with distal augmentation maneuvers in the common femoral, femoral and popliteal veins. COMPARISON:  None. FINDINGS: Contralateral Common Femoral Vein:  Respiratory phasicity is normal and symmetric with the symptomatic side. No evidence of thrombus. Normal compressibility. Common Femoral Vein: No evidence of thrombus. Normal compressibility, respiratory phasicity and response to augmentation. Saphenofemoral Junction: No evidence of thrombus. Normal compressibility and flow on color Doppler imaging. Profunda Femoral Vein: No evidence of thrombus. Normal compressibility and flow on color Doppler imaging. Femoral Vein: No evidence of thrombus. Normal compressibility, respiratory phasicity and response to augmentation. Popliteal Vein: No evidence of thrombus. Normal compressibility, respiratory phasicity and response to augmentation. Calf Veins: No evidence of thrombus. Normal compressibility and flow on color Doppler imaging. Superficial Great Saphenous Vein: No evidence of thrombus. Normal compressibility. Venous Reflux:  None. 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 There is thrombus within the graft. This is of unknown chronicity. Of note, on the CT dated February 05, 2016 the graft was occluded. IMPRESSION: 1. No DVT. 2. 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 Electronically Signed   By: Constance Holster M.D.   On: 05/27/2019 14:42   Anti-infectives: Anti-infectives (From admission, onward)   Start     Dose/Rate Route Frequency Ordered Stop   05/31/19 0200  clindamycin (CLEOCIN) capsule 300 mg     300 mg Oral Every 8 hours 05/31/19 0139 05/31/19 1740   05/31/19 0000  clindamycin (CLEOCIN) IVPB 300 mg  Status:  Discontinued    Note to Pharmacy: To be given in OR   300 mg 100 mL/hr over  30 Minutes Intravenous  Once 05/30/19 1716 05/31/19 0134   05/30/19 0940  ceFAZolin (ANCEF) 2-4 GM/100ML-% IVPB    Note to Pharmacy: Despina Arias  : cabinet override      05/30/19 0940 05/30/19 1122    05/29/19 2230  ceFAZolin (ANCEF) IVPB 2g/100 mL premix  Status:  Discontinued     2 g 200 mL/hr over 30 Minutes Intravenous  Once 05/29/19 2218 05/30/19 1413      Assessment/Plan: s/p Procedure(s): FASCIOTOMY (Right) Dressing changed this morning. No active bleeding but area of previous bleed packed with surgicel. Gauze packed on top of these areas. ABD pads and kerlix wrap applied. Will transfuse another 2 units today. Ok to be out of bed to a chair. Wean pressors as tolerated.   Her INR is 2.2 so I will hold clopidogrel today to assist with hemostasis. She is not receiving warfarin at this time.   LOS: 1 day   Juanna Cao 06/01/2019, 7:39 AM

## 2019-06-01 NOTE — Progress Notes (Signed)
PT Cancellation Note  Patient Details Name: Sara [name] MRN: [id number] DOB: [birth year]   Cancelled Treatment:    Reason Eval/Treat Not Completed: Nursing contacted and stated that patient again refused PT services this date secondary to pain.  Nursing stated pt requested no further attempts at PT this date but that she would try to participate tomorrow.  Will attempt to see pt tomorrow as medically appropriate.     Linus Salmons PT, DPT 06/01/19, 12:48 PM

## 2019-06-02 ENCOUNTER — Encounter: Payer: Self-pay | Admitting: Vascular Surgery

## 2019-06-02 LAB — CBC
HCT: 28 % — ABNORMAL LOW (ref 36.0–46.0)
Hemoglobin: 9 g/dL — ABNORMAL LOW (ref 12.0–15.0)
MCH: 27.6 pg (ref 26.0–34.0)
MCHC: 32.1 g/dL (ref 30.0–36.0)
MCV: 85.9 fL (ref 80.0–100.0)
Platelets: 108 10*3/uL — ABNORMAL LOW (ref 150–400)
RBC: 3.26 MIL/uL — ABNORMAL LOW (ref 3.87–5.11)
RDW: 15.9 % — ABNORMAL HIGH (ref 11.5–15.5)
WBC: 8.3 10*3/uL (ref 4.0–10.5)
nRBC: 0 % (ref 0.0–0.2)

## 2019-06-02 LAB — TYPE AND SCREEN
ABO/RH(D): O POS
Antibody Screen: NEGATIVE
Unit division: 0
Unit division: 0
Unit division: 0
Unit division: 0

## 2019-06-02 LAB — BPAM RBC
Blood Product Expiration Date: 202011072359
Blood Product Expiration Date: 202011072359
Blood Product Expiration Date: 202011122359
Blood Product Expiration Date: 202011122359
ISSUE DATE / TIME: 202010092327
ISSUE DATE / TIME: 202010100046
ISSUE DATE / TIME: 202010110820
ISSUE DATE / TIME: 202010111149
Unit Type and Rh: 5100
Unit Type and Rh: 5100
Unit Type and Rh: 5100
Unit Type and Rh: 5100

## 2019-06-02 LAB — BASIC METABOLIC PANEL
Anion gap: 4 — ABNORMAL LOW (ref 5–15)
BUN: 5 mg/dL — ABNORMAL LOW (ref 8–23)
CO2: 26 mmol/L (ref 22–32)
Calcium: 7.4 mg/dL — ABNORMAL LOW (ref 8.9–10.3)
Chloride: 107 mmol/L (ref 98–111)
Creatinine, Ser: 0.63 mg/dL (ref 0.44–1.00)
GFR calc Af Amer: >60 mL/min (ref >60)
GFR calc non Af Amer: >60 mL/min (ref >60)
Glucose, Bld: 118 mg/dL — ABNORMAL HIGH (ref 70–99)
Potassium: 3.5 mmol/L (ref 3.5–5.1)
Sodium: 137 mmol/L (ref 135–145)

## 2019-06-02 LAB — PHOSPHORUS: Phosphorus: 3.6 mg/dL (ref 2.5–4.6)

## 2019-06-02 LAB — MAGNESIUM: Magnesium: 1.9 mg/dL (ref 1.7–2.4)

## 2019-06-02 NOTE — Evaluation (Signed)
Occupational Therapy Evaluation Patient Details Name: Sara [name] MRN: [id number] DOB: Jan 10, 1951 Today's Date: 06/02/2019    History of Present Illness 68 Y/O female presenting with hypovolemic shock s/p revascularization and subsequent repair of compartment syndrome on 10/9.   Clinical Impression   Pt seen for OT evaluation this date. Prior to hospital admission, pt was independent and living by herself in a 1st floor apartment with her 2 small dogs. Currently pt demonstrates impairments in significant RLE pain with positional change once EOB and RLE was dangling briefly), impaired balance, decreased sensation to R foot (pins and needles), mild edema to R foot noted, decreased strength (RLE weaker than LLE), and now requiring mod assist for seated LB ADL and functional mobility. Functional bed mobility training; pt able to perform with CGA and increased BUE support/effort. VSS throughout session. Unable to trial functional ADL transfers 2/2 significant increase in RLE pain. Pt would benefit from skilled OT to address noted impairments and functional limitations (see below for any additional details) in order to maximize safety and independence while minimizing falls risk and caregiver burden.  Upon hospital discharge, recommend pt discharge to SNF. Will continue to assess as pt progresses. Per RN, no date yet for fasciotomy closures.     Follow Up Recommendations  SNF    Equipment Recommendations  3 in 1 bedside commode;Other (comment)(reacher, 2WW)    Recommendations for Other Services       Precautions / Restrictions Precautions Precautions: Fall Precaution Comments: RLE fasciotomies Restrictions Weight Bearing Restrictions: No Other Position/Activity Restrictions: no WBing restrictions per MD (RN previously noting [place] 2/2 pt being unable to tolerate full weight bearing because of pain)      Mobility Bed Mobility Overal bed mobility: Needs Assistance Bed Mobility: Supine to  Sit;Sit to Supine     Supine to sit: Min guard Sit to supine: Min guard   General bed mobility comments: pain limited, but no physical assist to perform, heavy BUE support on bed rail and HOB  Transfers                 General transfer comment: deferred 2/2 RLE pain    Balance Overall balance assessment: Needs assistance Sitting-balance support: Bilateral upper extremity supported;Feet unsupported Sitting balance-Leahy Scale: Fair                                     ADL either performed or assessed with clinical judgement   ADL Overall ADL's : Needs assistance/impaired                                       General ADL Comments: Mod A for LB ADL tasks, functional transfers deferred this date 2/2 significant RLE pain once EOB     Vision Baseline Vision/History: Wears glasses Wears Glasses: At all times Patient Visual Report: No change from baseline Vision Assessment?: No apparent visual deficits     Perception     Praxis      Pertinent Vitals/Pain Pain Assessment: 0-10 Pain Score: 10-Worst pain ever Pain Location: RLE when seated EOB and RLE was dangling, otherwise 0/10 in bed at rest Pain Descriptors / Indicators: Grimacing;Sharp Pain Intervention(s): Limited activity within patient's tolerance;Monitored during session;Repositioned     Hand Dominance Right   Extremity/Trunk Assessment Upper Extremity Assessment Upper Extremity Assessment: Overall WFL for tasks  assessed(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 but not functionally limited)   Lower Extremity Assessment Lower Extremity Assessment: RLE deficits/detail;LLE deficits/detail RLE Deficits / Details: at least 3+/5, pain limited, RLE distal of knee bandaged for fasciotomies, can wiggle toes, limited DF/PF 2/2 pain, pt endorses "pins and needles" in R foot, warm to touch, slight edema RLE: Unable to fully assess due to pain RLE Sensation: decreased light touch RLE Coordination:  decreased gross motor;decreased fine motor(pain limited) LLE Deficits / Details: at least 4-/5       Communication Communication Communication: No difficulties   Cognition Arousal/Alertness: Awake/alert Behavior During Therapy: WFL for tasks assessed/performed Overall Cognitive Status: Within Functional Limits for tasks assessed                                     General Comments  RLE bandaged distal to knee, seeping/discoloration noted on bandaging, pt aware and notifying RN with any changes, RLE repositioned after bed mobility with pillow back under RLE    Exercises Other Exercises Other Exercises: Bed mobility training   Shoulder Instructions      Home Living Family/patient expects to be discharged to:: Private residence Living Arrangements: Alone;Other (Comment)(2 small dogs) Available Help at Discharge: Family;Available PRN/intermittently(sister and brother in law) Type of Home: Apartment Home Access: Level entry     Home Layout: One level     Bathroom Shower/Tub: Teacher, early years/pre: Standard     Home Equipment: None          Prior Functioning/Environment Level of Independence: Independent        Comments: Pt indep with mobility and ADL/IADL Tasks. Endorses 1 fall in past 12 months 2/2 tripping over a curb with her dogs.        OT Problem List: Decreased strength;Decreased range of motion;Pain;Increased edema;Impaired balance (sitting and/or standing);Decreased knowledge of use of DME or AE      OT Treatment/Interventions: Self-care/ADL training;Therapeutic exercise;Therapeutic activities;DME and/or AE instruction;Patient/family education;Balance training    OT Goals(Current goals can be found in the care plan section) Acute Rehab OT Goals Patient Stated Goal: get better and go home to my dogs OT Goal Formulation: With patient Time For Goal Achievement: 06/16/19 Potential to Achieve Goals: Good ADL Goals Pt Will  Perform Lower Body Dressing: with min guard assist;sit to/from stand;with adaptive equipment Pt Will Transfer to Toilet: with min guard assist;ambulating;bedside commode(LRAD for amb) Additional ADL Goal #1: Pt will perform bed mobility with modified independence for EOB ADL  OT Frequency: Min 2X/week   Barriers to D/C:            Co-evaluation              AM-PAC OT "6 Clicks" Daily Activity     Outcome Measure Help from another person eating meals?: None Help from another person taking care of personal grooming?: None Help from another person toileting, which includes using toliet, bedpan, or urinal?: A Lot Help from another person bathing (including washing, rinsing, drying)?: A Lot Help from another person to put on and taking off regular upper body clothing?: A Little Help from another person to put on and taking off regular lower body clothing?: A Lot 6 Click Score: 17   End of Session Nurse Communication: Mobility status  Activity Tolerance: Patient limited by pain Patient left: in bed;with call bell/phone within reach;Other (comment)(pillow under RLE)  OT Visit Diagnosis: Other abnormalities of  gait and mobility (R26.89);Muscle weakness (generalized) (M62.81);Pain Pain - Right/Left: Right Pain - part of body: Leg                Time: 1008-1030 OT Time Calculation (min): 22 min Charges:  OT General Charges $OT Visit: 1 Visit OT Evaluation $OT Eval Low Complexity: 1 Low OT Treatments $Self Care/Home Management : 8-22 mins  Jeni Salles, MPH, MS, OTR/L ascom [phone] 06/02/19, 10:50 AM

## 2019-06-02 NOTE — Evaluation (Signed)
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  Pt is s/p revascularization and subsequent repair of RLE compartment syndrome on 10/9.  PMH includes: CAD, HTN, PVD, depression, GERD, HLD, carotid artery stenosis, and BPPV.    Clinical Impression  Pt presented with deficits in strength, transfers, mobility, gait, balance, and activity tolerance.  Pt required min A with bed mobility tasks and transfers and was unable to ambulate secondary to RLE weakness and pain with WB.  Pt encouraged to place R foot on the ground in standing to her tolerance and was able to gently rest her toes on the floor but was unable to tolerate and significant movement towards R ankle DF when WB.  Pt has multiple steps to navigate to get into her apartment and limited caregiver assistance available and would not be safe to return to her prior living situation at this time.  Pt will benefit from PT services in a SNF setting upon discharge to safely address above deficits for decreased caregiver assistance and eventual return to PLOF.      Follow Up Recommendations SNF    Equipment Recommendations  Other (comment)(TBD at next venue of care (Pt owns no DME))    Recommendations for Other Services       Precautions / Restrictions Precautions Precautions: Fall Precaution Comments: RLE fasciotomies Restrictions Weight Bearing Restrictions: No Other Position/Activity Restrictions: No WB restrictions per MD (RN previously noting PWB 2/2 pt being unable to tolerate full weight bearing because of pain)      Mobility  Bed Mobility Overal bed mobility: Needs Assistance Bed Mobility: Supine to Sit;Sit to Supine     Supine to sit: Supervision Sit to supine: Min assist   General bed  mobility comments: Min A with RLE back to bed during sit to sup  Transfers Overall transfer level: Needs assistance Equipment used: Rolling walker (2 wheeled) Transfers: Sit to/from Stand Sit to Stand: Min assist         General transfer comment: Patient unable to assist with the RLE during transfers  Ambulation/Gait             General Gait Details: Unable  Stairs            Wheelchair Mobility    Modified Rankin (Stroke Patients Only)       Balance Overall balance assessment: Needs assistance Sitting-balance support: Bilateral upper extremity supported;Feet unsupported Sitting balance-Leahy Scale: Fair     Standing balance support: Bilateral upper extremity supported Standing balance-Leahy Scale: Fair Standing balance comment: Heavy lean on the RW for support in standing                             Pertinent Vitals/Pain Pain Assessment: No/denies pain Pain Score: 10-Worst pain ever Pain Location: No pain at rest but grimacing and moaning with RLE WB Pain Descriptors / Indicators: Guarding;Grimacing;Moaning Pain Intervention(s): Monitored during session;Limited activity within patient's tolerance;Premedicated before session    [place] expects to be discharged to:: Private residence Living Arrangements: Alone Available Help at Discharge: Family;Available PRN/intermittently;Friend(s) Type of Home: Apartment Home Access: Stairs to enter Entrance Stairs-Rails: Right Entrance Stairs-Number of Steps: 2 Home Layout: One level Home Equipment: None Additional Comments: Level entry into apt but has 2 steps with R  rail to come up from the parking area    Prior Function Level of Independence: Independent         Comments: Pt indep with amb without an AD community distances, Ind with mobility and ADL/IADL tasks. Endorses 1 fall in past 12 months 2/2 tripping over a curb with her dogs.     Hand Dominance   Dominant Hand:  Right    Extremity/Trunk Assessment   Upper Extremity Assessment Upper Extremity Assessment: Overall WFL for tasks assessed    Lower Extremity Assessment Lower Extremity Assessment: Generalized weakness;RLE deficits/detail RLE Deficits / Details: Pain limited, RLE distal of knee bandaged for fasciotomies, can wiggle toes, limited AROM with R ankle DF/PF limited by pain, pt endorses "pins and needles" in R foot, warm to touch, slight edema RLE: Unable to fully assess due to pain RLE Sensation: decreased light touch RLE Coordination: decreased gross motor;decreased fine motor(pain limited) LLE Deficits / Details: WFL       Communication   Communication: No difficulties  Cognition Arousal/Alertness: Awake/alert Behavior During Therapy: WFL for tasks assessed/performed Overall Cognitive Status: Within Functional Limits for tasks assessed                                        General Comments General comments (skin integrity, edema, etc.): RLE bandaged distal to knee, seeping/discoloration noted on bandaging, pt aware and notifying RN with any changes, RLE repositioned after bed mobility with pillow back under RLE    Exercises Total Joint Exercises Ankle Circles/Pumps: AROM;Both;5 reps;10 reps Quad Sets: Strengthening;Both;10 reps Gluteal Sets: Strengthening;Both;10 reps Heel Slides: AROM;Left;10 reps Hip ABduction/ADduction: AROM;Left;10 reps Long Arc Quad: AROM;Both;5 reps;10 reps;Other (comment)(limited on the RLE) Knee Flexion: AROM;Both;5 reps;10 reps;Other (comment)(limited on the RLE) Marching in Standing: AROM;Right;Standing;5 reps Other Exercises Other Exercises: HEP education/review for BLE APs, GS, QS, and LAQs x 10 each 5-6x/day as tolerated   Assessment/Plan    PT Assessment Patient needs continued PT services  PT Problem List Decreased strength;Decreased range of motion;Decreased activity tolerance;Decreased balance;Decreased mobility;Decreased  knowledge of use of DME       PT Treatment Interventions DME instruction;Gait training;Stair training;Functional mobility training;Therapeutic activities;Therapeutic exercise;Balance training;Patient/family education    PT Goals (Current goals can be found in the Care Plan section)  Acute Rehab PT Goals Patient Stated Goal: To be able to walk and do the things I used to do PT Goal Formulation: With patient Time For Goal Achievement: 06/15/19 Potential to Achieve Goals: Good    Frequency Min 2X/week   Barriers to discharge Inaccessible home environment;Decreased caregiver support      Co-evaluation               AM-PAC PT "6 Clicks" Mobility  Outcome Measure Help needed turning from your back to your side while in a flat bed without using bedrails?: A Little Help needed moving from lying on your back to sitting on the side of a flat bed without using bedrails?: A Little Help needed moving to and from a bed to a chair (including a wheelchair)?: A Lot Help needed standing up from a chair using your arms (e.g., wheelchair or bedside chair)?: A Little Help needed to walk in hospital room?: Total Help needed climbing 3-5 steps with a railing? : Total 6 Click Score: 13    End of Session Equipment Utilized During Treatment: Gait belt Activity Tolerance: Patient limited by pain Patient  left: in bed;with call bell/phone within reach Nurse Communication: Mobility status PT Visit Diagnosis: Difficulty in walking, not elsewhere classified (R26.2);Muscle weakness (generalized) (M62.81)    Time: [id number] PT Time Calculation (min) (ACUTE ONLY): 26 min   Charges:   PT Evaluation $PT Eval Moderate Complexity: 1 Mod PT Treatments $Therapeutic Exercise: 8-22 mins        D. Royetta Asal PT, DPT 06/02/19, 12:38 PM

## 2019-06-02 NOTE — Progress Notes (Signed)
CRITICAL CARE PROGRESS NOTE    Name: Sara [name] MRN: [id number] DOB: 10-Dec-1950     LOS: 2   SUBJECTIVE FINDINGS & SIGNIFICANT EVENTS   Patient description:  HPI:  68 y/o female with a medical history as indicated below admitted with atherosclerotic occlusive disease bilateral lower extremities withrightlower extremityischemia, underwent revascularization procedure in the morning of 05/30/2019 and later in the day developed compartment syndrome right lower extremity requiring a 4 compartment fasciotomy right lower extremity. Post-procedure, patient became hypotensive requiring pressors. PCCM was consulted for BP management. She is fully awake and beside RLE pain, she has no other complaints.  She is on Neosynephrine.    Lines / Drains: piv  Cultures / Sepsis markers: Blood cx   Overnight: improved   PAST MEDICAL HISTORY   Past Medical History:  Diagnosis Date  . Depression   . GERD (gastroesophageal reflux disease)   . History of blood clots   . Hyperlipidemia   . Hypertension   . Mild mitral regurgitation   . Osteoporosis   . Stomach ulcer   . Vascular disease    Sees Dr. Delana Meyer  . Vertigo    Last episode approx Aug 2015  . Wears dentures    full upper     SURGICAL HISTORY   Past Surgical History:  Procedure Laterality Date  . ADENOIDECTOMY    . BREAST CYST ASPIRATION Left   . CARDIAC CATHETERIZATION  02/15/2017   [facility]  . COLONOSCOPY WITH PROPOFOL N/A 10/22/2017   Procedure: COLONOSCOPY WITH PROPOFOL;  Surgeon: Lucilla Lame, MD;  Location: [facility];  Service: Endoscopy;  Laterality: N/A;  specimens not taken--pt on Plavix will be brought back in after 7 days off med  . COLONOSCOPY WITH PROPOFOL N/A 11/05/2017   Procedure: COLONOSCOPY WITH PROPOFOL;  Surgeon: Lucilla Lame,  MD;  Location: [facility];  Service: Endoscopy;  Laterality: N/A;  . ESOPHAGOGASTRODUODENOSCOPY N/A 12/21/2014   Procedure: ESOPHAGOGASTRODUODENOSCOPY (EGD);  Surgeon: Lucilla Lame, MD;  Location: [facility];  Service: Gastroenterology;  Laterality: N/A;  . ESOPHAGOGASTRODUODENOSCOPY (EGD) WITH PROPOFOL N/A 02/08/2016   Procedure: ESOPHAGOGASTRODUODENOSCOPY (EGD) WITH PROPOFOL;  Surgeon: Lucilla Lame, MD;  Location: [facility];  Service: Endoscopy;  Laterality: N/A;  . HEMORROIDECTOMY  2014  . LOWER EXTREMITY ANGIOGRAPHY Right 05/30/2019   Procedure: LOWER EXTREMITY ANGIOGRAPHY;  Surgeon: [name], MD;  Location: [facility];  Service: Cardiovascular;  Laterality: Right;  . PERIPHERAL VASCULAR CATHETERIZATION N/A 02/09/2016   Procedure: Abdominal Aortogram w/Lower Extremity;  Surgeon: [name], MD;  Location: [facility];  Service: Cardiovascular;  Laterality: N/A;  . PERIPHERAL VASCULAR CATHETERIZATION Right 02/10/2016   Procedure: Lower Extremity Angiography;  Surgeon: [name], MD;  Location: [facility];  Service: Cardiovascular;  Laterality: Right;  . POLYPECTOMY  11/05/2017   Procedure: POLYPECTOMY;  Surgeon: Lucilla Lame, MD;  Location: [facility];  Service: Endoscopy;;  . TONSILLECTOMY    . VASCULAR SURGERY  [id number]   Fem-Pop Bypass     FAMILY HISTORY   Family History  Problem Relation Age of Onset  . CVA Mother   . Heart attack Mother   . Cancer Father        colon cancer  . Colon cancer Father   . Heart disease Maternal Uncle   . Breast cancer Neg Hx      SOCIAL HISTORY   Social History   Tobacco Use  . Smoking status: Former Smoker  Packs/day: 2.00    Years: 25.00    Pack years: 50.00    Types: Cigarettes    Quit date: 08/22/1991    Years since quitting: 27.7  . Smokeless tobacco: Never Used  Substance Use Topics  . Alcohol use: No    Alcohol/week: 0.0 standard drinks  . Drug  use: Yes    Comment: tramadol X4 50mg  daily     MEDICATIONS   Current Medication:  Current Facility-Administered Medications:  .  acetaminophen (TYLENOL) tablet 650 mg, 650 mg, Oral, Q6H PRN, Schnier, Dolores Lory, MD, 650 mg at 06/01/19 2341 .  acidophilus (RISAQUAD) capsule 2 capsule, 2 capsule, Oral, TID, Schnier, Dolores Lory, MD, 2 capsule at 06/01/19 2109 .  alum & mag hydroxide-simeth (MAALOX/MYLANTA) 200-200-20 MG/5ML suspension 15-30 mL, 15-30 mL, Oral, Q2H PRN, Schnier, Dolores Lory, MD .  calcium-vitamin D (OSCAL WITH D) 500-200 MG-UNIT per tablet 2 tablet, 2 tablet, Oral, Q breakfast, Schnier, Dolores Lory, MD, 2 tablet at 06/02/19 [phone] .  Chlorhexidine Gluconate Cloth 2 % PADS 6 each, 6 each, Topical, Daily, Flora Lipps, MD, 6 each at 06/01/19 2110 .  clopidogrel (PLAVIX) tablet 75 mg, 75 mg, Oral, Daily, Schnier, Dolores Lory, MD, 75 mg at 06/01/19 1024 .  docusate sodium (COLACE) capsule 100 mg, 100 mg, Oral, Daily, Schnier, Dolores Lory, MD, 100 mg at 06/01/19 1024 .  enoxaparin (LOVENOX) injection 40 mg, 40 mg, Subcutaneous, Q24H, Schnier, Dolores Lory, MD, 40 mg at 06/02/19 0819 .  losartan (COZAAR) tablet 25 mg, 25 mg, Oral, Daily, Schnier, Dolores Lory, MD, 25 mg at 06/01/19 1024 .  magnesium sulfate IVPB 2 g 50 mL, 2 g, Intravenous, Daily PRN, Schnier, Dolores Lory, MD .  morphine 2 MG/ML injection 2 mg, 2 mg, Intravenous, Q30 min PRN, Flora Lipps, MD, 2 mg at 06/02/19 0519 .  ondansetron (ZOFRAN) injection 4 mg, 4 mg, Intravenous, Q6H PRN, Schnier, Dolores Lory, MD .  oxyCODONE (Oxy IR/ROXICODONE) immediate release tablet 5 mg, 5 mg, Oral, Q4H PRN, Tukov-Yual, Magdalene S, NP, 5 mg at 06/02/19 0519 .  oxyCODONE (OXYCONTIN) 12 hr tablet 10 mg, 10 mg, Oral, Q12H, Tukov-Yual, Magdalene S, NP, 10 mg at 06/01/19 2109 .  pantoprazole (PROTONIX) EC tablet 40 mg, 40 mg, Oral, Daily, Schnier, Dolores Lory, MD, 40 mg at 06/01/19 1024 .  polyethylene glycol (MIRALAX / GLYCOLAX) packet 17 g, 17 g, Oral, Daily,  Tukov-Yual, Magdalene S, NP, 17 g at 06/01/19 1024 .  potassium chloride SA (KLOR-CON) CR tablet 20-40 mEq, 20-40 mEq, Oral, Daily PRN, Schnier, Dolores Lory, MD .  senna-docusate (Senokot-S) tablet 2 tablet, 2 tablet, Oral, QHS PRN, Tukov-Yual, Magdalene S, NP .  sorbitol 70 % solution 30 mL, 30 mL, Oral, Daily PRN, Delana Meyer, Dolores Lory, MD .  venlafaxine XR (EFFEXOR-XR) 24 hr capsule 75 mg, 75 mg, Oral, Q breakfast, Juanna Cao, MD, 75 mg at 06/02/19 0819    ALLERGIES   Hydrocodone-acetaminophen, Amoxicillin, and Vicodin [hydrocodone-acetaminophen]    REVIEW OF SYSTEMS     10 point ROS conducted and is negative except as per subjective findings  PHYSICAL EXAMINATION   Vital Signs: Temp:  [98.3 F (36.8 C)-101.4 F (38.6 C)] 98.3 F (36.8 C) (10/12 0800) Pulse Rate:  [80-113] 90 (10/12 0800) Resp:  [13-28] 17 (10/12 0800) BP: (81-119)/(53-84) 86/60 (10/12 0800) SpO2:  [87 %-100 %] 95 % (10/12 0800)  GENERAL:nad HEAD: Normocephalic, atraumatic.  EYES: Pupils equal, round, reactive to light.  No scleral icterus.  MOUTH: Moist mucosal membrane.  NECK: Supple. No thyromegaly. No nodules. No JVD.  PULMONARY: ctab CARDIOVASCULAR: S1 and S2. Regular rate and rhythm. No murmurs, rubs, or gallops.  GASTROINTESTINAL: Soft, nontender, non-distended. No masses. Positive bowel sounds. No hepatosplenomegaly.  MUSCULOSKELETAL: No swelling, clubbing, or edema.  NEUROLOGIC: Mild distress due to acute illness SKIN:intact,warm,dry   PERTINENT DATA     Infusions: . magnesium sulfate bolus IVPB     Scheduled Medications: . acidophilus  2 capsule Oral TID  . calcium-vitamin D  2 tablet Oral Q breakfast  . Chlorhexidine Gluconate Cloth  6 each Topical Daily  . clopidogrel  75 mg Oral Daily  . docusate sodium  100 mg Oral Daily  . enoxaparin (LOVENOX) injection  40 mg Subcutaneous Q24H  . losartan  25 mg Oral Daily  . oxyCODONE  10 mg Oral Q12H  . pantoprazole  40 mg Oral Daily   . polyethylene glycol  17 g Oral Daily  . venlafaxine XR  75 mg Oral Q breakfast   PRN Medications: acetaminophen, alum & mag hydroxide-simeth, magnesium sulfate bolus IVPB, morphine injection, ondansetron (ZOFRAN) IV, oxyCODONE, potassium chloride, senna-docusate, sorbitol Hemodynamic parameters:   Intake/Output: 10/11 0701 - 10/12 0700 In: 2237.2 [I.V.:251.4; Blood:680; IV Piggyback:1305.8] Out: 1800 [Urine:1800]  Ventilator  Settings:     Other Labs:     LAB RESULTS:  Basic Metabolic Panel: Recent Labs  Lab 05/30/19 0932 05/31/19 0610 06/01/19 0352 06/02/19 0452  NA  --   --  139 137  K  --   --  3.8 3.5  CL  --   --  109 107  CO2  --   --  26 26  GLUCOSE  --   --  125* 118*  BUN 11  --  <5* 5*  CREATININE 0.74  --  0.53 0.63  CALCIUM  --   --  7.3* 7.4*  MG  --  1.9  --  1.9  PHOS  --   --  2.3* 3.6   Liver Function Tests: Recent Labs  Lab 06/01/19 0352  AST 36  ALT 13  ALKPHOS 29*  BILITOT 0.3  PROT 4.4*  ALBUMIN 2.2*   No results for input(s): LIPASE, AMYLASE in the last 168 hours. No results for input(s): AMMONIA in the last 168 hours. CBC: Recent Labs  Lab 05/30/19 2120 05/31/19 1021 06/01/19 0352 06/01/19 1444 06/02/19 0452  WBC 18.9* 14.6* 9.9  --  8.3  HGB 7.2* 9.1* 7.6* 9.9* 9.0*  HCT 24.1* 28.0* 23.3* 30.7* 28.0*  MCV 88.0 86.7 85.3  --  85.9  PLT 188 147* 128*  --  108*   Cardiac Enzymes: No results for input(s): CKTOTAL, CKMB, CKMBINDEX, TROPONINI in the last 168 hours. BNP: Invalid input(s): POCBNP CBG: Recent Labs  Lab 05/31/19 0142  GLUCAP 164*     IMAGING RESULTS:  Imaging: No results found. @PROBHOSP @   ASSESSMENT AND PLAN    -Multidisciplinary rounds held today  DISCUSSION: 68 Y/O female presenting with hypovolemic shock s/p revascularization and subsequent repair of compartment syndrome.    ASSESSMENT  Hypovolemic shock Compartment syndrome right lower extremity status post revascularization now s/p 4  compartment fasciotomy right lower extremity  Atherosclerotic occlusive disease bilateral lower extremities withrightlower extremityischemia  PLAN IV fluids, blood products and pressors to maintain MAP GE 65 Hemodynamic monitoring per ICU protocol Repeat labs and transfuse for Hemoglobin<7 Pain management with oxycodone and dilaudid prn Antibiotics as above for prophylaxis Monitor for bleeding and increased RLE swelling Rest of the treatment  plan per vascular surgery  Best Practice: Code Status:Full code Diet: heart healthy GI prophylaxis:PPI VTE prophylaxis:  SQ lovenox and SCDs  GI/Nutrition GI PROPHYLAXIS as indicated DIET-->TF's as tolerated Constipation protocol as indicated  ENDO - ICU hypoglycemic\Hyperglycemia protocol -check FSBS per protocol   ELECTROLYTES -follow labs as needed -replace as needed -pharmacy consultation   DVT/GI PRX ordered -SCDs  TRANSFUSIONS AS NEEDED MONITOR FSBS ASSESS the need for LABS as needed   Critical care provider statement:    Critical care time (minutes):  32   Critical care time was exclusive of:  Separately billable procedures and treating other patients   Critical care was necessary to treat or prevent imminent or life-threatening deterioration of the following conditions:   Circulatory shock likely due to hypovolemia critical limb ischemia, compartment syndrome of the right lower extremity status post fasciotomy, multiple comorbid conditions.   Critical care was time spent personally by me on the following activities:  Development of treatment plan with patient or surrogate, discussions with consultants, evaluation of patient's response to treatment, examination of patient, obtaining history from patient or surrogate, ordering and performing treatments and interventions, ordering and review of laboratory studies and re-evaluation of patient's condition.  I assumed direction of critical care for this patient from another  provider in my specialty: no    This document was prepared using Dragon voice recognition software and may include unintentional dictation errors.    Ottie Glazier, M.D.  Division of [facility]

## 2019-06-02 NOTE — Progress Notes (Signed)
[facility]  Daily Progress Note   Subjective  - 3 Days Post-Op  Patient feeling significantly better today.  Worked with physical therapy.  Did not really walk much, but was able to stand and put a little weight on the leg.  Numbness and pain is better in the right leg and foot.  Motor function is intact.  Foot is well-perfused.  Objective Vitals:   06/02/19 1035 06/02/19 1100 06/02/19 1200 06/02/19 1300  BP: (!) 84/60 (!) 105/57 110/69 (!) 100/57  Pulse: 92 92 (!) 105 100  Resp:  17 (!) 23 15  Temp:   99 F (37.2 C)   TempSrc:   Oral   SpO2: 99% 97% 97% 94%  Weight:      Height:        Intake/Output Summary (Last 24 hours) at 06/02/2019 1433 Last data filed at 06/02/2019 0958 Gross per 24 hour  Intake 268.08 ml  Output 1800 ml  Net -1531.92 ml    PULM  CTAB CV  tachycardic but regular VASC  foot warm with good capillary refill.  Soft pedal pulses present.  Swelling is mild to moderate at this point.  Laboratory CBC    Component Value Date/Time   WBC 8.3 06/02/2019 0452   HGB 9.0 (L) 06/02/2019 0452   HGB 14.4 09/21/2015 0916   HCT 28.0 (L) 06/02/2019 0452   HCT 44.4 09/21/2015 0916   PLT 108 (L) 06/02/2019 0452   PLT 222 09/21/2015 0916    BMET    Component Value Date/Time   NA 137 06/02/2019 0452   NA 139 09/21/2015 0916   K 3.5 06/02/2019 0452   CL 107 06/02/2019 0452   CO2 26 06/02/2019 0452   GLUCOSE 118 (H) 06/02/2019 0452   BUN 5 (L) 06/02/2019 0452   BUN 12 09/21/2015 0916   CREATININE 0.63 06/02/2019 0452   CREATININE 0.88 05/16/2016 1034   CALCIUM 7.4 (L) 06/02/2019 0452   GFRNONAA >60 06/02/2019 0452   GFRNONAA 66 05/01/2016 0846   GFRAA >60 06/02/2019 0452   GFRAA 76 05/01/2016 0846    Assessment/Planning: POD #3 s/p RLE revascularization and subsequent fasciotomy for compartment syndrome   Overall doing reasonably well with improved pain and less numbness in the foot.  Motor function is intact.  Calf still  with mild to moderate swelling.  May consider closing fasciotomy sites on Wednesday.  Okay to go to the floor and increase activity as tolerated.    Leotis Pain  06/02/2019, 2:33 PM

## 2019-06-03 ENCOUNTER — Encounter: Payer: Self-pay | Admitting: Vascular Surgery

## 2019-06-03 MED ORDER — SODIUM CHLORIDE 0.9 % IV SOLN
INTRAVENOUS | Status: DC
  Administered 2019-06-04 – 2019-06-05 (×3): via INTRAVENOUS

## 2019-06-03 MED ORDER — CLINDAMYCIN PHOSPHATE 600 MG/50ML IV SOLN
600.0000 mg | Freq: Once | INTRAVENOUS | Status: AC
  Administered 2019-06-04: 600 mg via INTRAVENOUS
  Filled 2019-06-03: qty 50

## 2019-06-03 NOTE — TOC Initial Note (Signed)
Transition of Care [facility]) - Initial/Assessment Note    Patient Details  Name: Sara [name] MRN: [id number] Date of Birth: [birth year]  Transition of [facility]) CM/SW Contact:    Annamaria Boots, [place] Phone Number: 06/03/2019, 3:53 PM  Clinical Narrative: CSW consulted for SNF placement. CSW met with patient to discuss discharge plan. Patient reports that she lives alone with her two small dogs. Per patient she has been through this before and does not plan to go to SNF. She states that therapy may have recommended it, but she does not plan to go. She also states that she is unsure if she will use home health services or not at this time. Patient states that she will see how she feels closer to discharge and will make a decision at that time. CSW will continue to follow for discharge planning.                   Expected Discharge Plan: Home/Self Care Barriers to Discharge: Continued Medical Work up   Patient Goals and CMS Choice   CMS Medicare.gov Compare Post Acute Care list provided to:: Patient Choice offered to / list presented to : Patient  Expected Discharge Plan and Services Expected Discharge Plan: Home/Self Care                                              Prior Living Arrangements/Services   Lives with:: Self Patient language and need for interpreter reviewed:: Yes Do you feel safe going back to the place where you live?: Yes      Need for Family Participation in Patient Care: No (Comment) Care giver support system in place?: No (comment)   Criminal Activity/Legal Involvement Pertinent to Current Situation/Hospitalization: No - Comment as needed  Activities of Daily Living Home Assistive Devices/Equipment: None ADL Screening (condition at time of admission) Patient's cognitive ability adequate to safely complete daily activities?: Yes Is the patient deaf or have difficulty hearing?: No Does the patient have difficulty seeing, even when wearing  glasses/contacts?: No Does the patient have difficulty concentrating, remembering, or making decisions?: No Patient able to express need for assistance with ADLs?: Yes Does the patient have difficulty dressing or bathing?: No Independently performs ADLs?: Yes (appropriate for developmental age) Does the patient have difficulty walking or climbing stairs?: No Weakness of Legs: None Weakness of Arms/Hands: None  Permission Sought/Granted Permission sought to share information with : Case Manager, Customer service manager, Family Supports Permission granted to share information with : Yes, Verbal Permission Granted              Emotional Assessment Appearance:: Appears stated age Attitude/Demeanor/Rapport: Engaged Affect (typically observed): Pleasant Orientation: : Oriented to Self, Oriented to Place, Oriented to  Time, Oriented to Situation Alcohol / Substance Use: Not Applicable Psych Involvement: No (comment)  Admission diagnosis:  RT Leg Angiography   Ischemic Leg   Pt to have Covid test 05-29-19 per Mickel Baas Patient Active Problem List   Diagnosis Date Noted  . Ischemic leg 05/31/2019  . Atherosclerosis of artery of extremity with rest pain ([place]) 05/30/2019  . Depression 05/29/2019  . Eczema of lower extremity 03/04/2018  . Chronic venous insufficiency 03/02/2018  . Personal history of colonic polyps   . Family history of colonic polyps   . Benign neoplasm of ascending colon   . Dyspnea on exertion 02/07/2017  .  Non-rheumatic mitral regurgitation 02/07/2017  . Precordial pain 02/07/2017  . CAD (coronary artery disease) 12/21/2016  . Essential hypertension 12/21/2016  . Compression fracture of lumbar spine, non-traumatic ([place]) 04/26/2016  . Compression fracture of L3 lumbar vertebra 04/20/2016  . PVD (peripheral vascular disease) ([place]) 02/24/2016  . Family history of premature CAD 02/24/2016  . Anticoagulated on warfarin 02/21/2016  . Gastritis   . Other specified  diseases of esophagus   . Hiatal hernia   . Gastritis and gastroduodenitis   . Ischemia of lower extremity   . Arterial occlusion ([place])   . Atherosclerosis of aorta ([place])   . History of smoking   . Hyperlipidemia   . Pain in the chest   . 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 Carotid artery stenosis 01/04/2016  . Vertigo, benign paroxysmal 12/28/2015  . Clinical depression 09/06/2015  . History of alcoholism ([place]) 09/06/2015  . Angiopathy, 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[place]) 09/06/2015  . Peripheral vascular disease ([place]) 09/06/2015  . Acute non-recurrent maxillary sinusitis 07/14/2015  . Vaginal pruritus 05/26/2015  . Chronic recurrent major depressive disorder ([place]) 03/09/2015  . 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 Peripheral blood vessel disorder ([place]) 03/09/2015  . Acid reflux 03/09/2015  . GERD (gastroesophageal reflux disease) 12/21/2014  . Carotid artery narrowing 01/28/2014  . Peripheral arterial occlusive disease ([place]) 06/08/2011  . Occlusion and stenosis of unspecified carotid artery 06/08/2011  . PAD (peripheral artery disease) ([place]) 06/08/2011   PCP:  Danelle Berry, NP Pharmacy:   [facility] [place], [place] to [address] [place] [address] Phone: [phone] Fax: [phone]  [id number] [id number]: [phone]: [phone]    Social Determinants of Health (SDOH) Interventions    Readmission Risk Interventions No flowsheet data found.

## 2019-06-03 NOTE — Progress Notes (Signed)
[facility] Daily Progress Note   Subjective: 4 Days Post-Op:  1. 4 compartment fasciotomy right lower extremity   1. Introduction catheter into right lower extremity 3rd order catheter placement  2. Contrast injection right lower extremity for distal runoff  3. Percutaneous transluminal angioplasty and stent placement at 2 locations in the femoral below-knee popliteal bypass 4. Percutaneous transluminal angioplasty and stent placement tibioperoneal trunk  5. Mechanical thrombectomy using the penumbra CAT 6 right femoral popliteal bypass and tibioperoneal trunk and proximal peroneal.             6.  Infusion 12 mg of TPA right femoral-popliteal bypass.             7.  Star close closure left common femoral arteriotomy  Patient without complaint.  Improved right calf pain.  Patient working with physical therapy.  Objective: Vitals:   06/02/19 1726 06/02/19 2015 06/03/19 0500 06/03/19 1151  BP: 110/68 117/62 99/65 105/68  Pulse: (!) 114 (!) 109 82 (!) 106  Resp: 20 18 16 17   Temp: (!) 101.6 F (38.7 C) 99.9 F (37.7 C) 98.1 F (36.7 C) 99.7 F (37.6 C)  TempSrc: Oral Oral Oral Oral  SpO2: 97% 95% 97% 95%  Weight:      Height:        Intake/Output Summary (Last 24 hours) at 06/03/2019 1659 Last data filed at 06/03/2019 1351 Gross per 24 hour  Intake 480 ml  Output 2300 ml  Net -1820 ml   Physical Exam: A&Ox3, NAD CV: RRR Pulmonary: CTA Bilaterally Abdomen: Soft, Nontender, Nondistended GU: Foley intact - draining clear urine Vascular:  Right lower extremity: Thigh soft.  Calf soft.  Extremities warm distally to toes.  Dressing without any drainage.  Good capillary refill.  Foot is warm.  Motor/sensory is intact.   Laboratory: CBC    Component Value Date/Time   WBC 8.3 06/02/2019 0452   HGB 9.0 (L) 06/02/2019 0452   HGB 14.4 09/21/2015 0916   HCT 28.0 (L) 06/02/2019 0452   HCT 44.4 09/21/2015 0916   PLT 108 (L) 06/02/2019 0452   PLT 222 09/21/2015 0916   BMET    Component Value Date/Time   NA 137 06/02/2019 0452   NA 139 09/21/2015 0916   K 3.5 06/02/2019 0452   CL 107 06/02/2019 0452   CO2 26 06/02/2019 0452   GLUCOSE 118 (H) 06/02/2019 0452   BUN 5 (L) 06/02/2019 0452   BUN 12 09/21/2015 0916   CREATININE 0.63 06/02/2019 0452   CREATININE 0.88 05/16/2016 1034   CALCIUM 7.4 (L) 06/02/2019 0452   GFRNONAA >60 06/02/2019 0452   GFRNONAA 66 05/01/2016 0846   GFRAA >60 06/02/2019 0452   GFRAA 76 05/01/2016 0846   Assessment/Planning: The patient is a 68 year old female with a known history of peripheral artery disease status post a right lower extremity angiogram with a take back to the operating room for a 4 compartment fasciotomy for compartment syndrome - POD#4 1) DC Foley 2) we will preop the patient to return to the OR for fasciotomy closure tomorrow with Dr. Delana Meyer  Discussed with Dr. Eber Hong Ermine Spofford PA-C 06/03/2019 4:59 PM

## 2019-06-03 NOTE — Care Management Important Message (Signed)
Important Message  Patient Details  Name: Sara [name] MRN: [id number] Date of Birth: 21-Aug-1951   Medicare Important Message Given:  Yes  Initial Medicare IM given by Patient Access Associate on 06/02/2019 at 12:38pm.    Dannette Barbara 06/03/2019, 8:15 AM

## 2019-06-03 NOTE — Progress Notes (Signed)
Physical Therapy Treatment Patient Details Name: Sara [name] MRN: [id number] DOB: [birth year] Today's Date: 06/03/2019    History of Present Illness Pt is a 68 yo female diagnosed with Atherosclerotic occlusive disease bilateral lower extremities with right lower extremity ischemia.  Pt is s/p revascularization and subsequent repair of RLE compartment syndrome on 10/9.  PMH includes: CAD, HTN, PVD, depression, GERD, HLD, carotid artery stenosis, and BPPV.    PT Comments    Pt presented with deficits in strength, transfers, mobility, gait, balance, and activity tolerance. Pt's pain was 0/10 at rest, 5/10 with mobility and transfers, and 9/10 with PWB on RLE. Pt completed bed exercises within her tolerance with limited AROM in R DF. Pt transferred from sit<>stand with min A, but pain in RLE started increasing as soon as her RLE was in a dependent position, and pain became intolerable with even PWB in standing. Pt returned to supine and continued exercises using active release technique to increase AROM within pt's tolerance. Pt will benefit from PT services in a SNF setting upon discharge to safely address above deficits for decreased caregiver assistance and eventual return to PLOF.   Follow Up Recommendations  SNF     Equipment Recommendations  Other (comment)(TBD at next venue of care (Pt owns no DME))    Recommendations for Other Services       Precautions / Restrictions Precautions Precautions: Fall Precaution Comments: RLE fasciotomies Restrictions Weight Bearing Restrictions: No Other Position/Activity Restrictions: No WB restrictions per MD (RN previously noting PWB 2/2 pt being unable to tolerate full weight bearing because of pain)    Mobility  Bed Mobility Overal bed mobility: Needs Assistance Bed Mobility: Supine to Sit;Sit to Supine     Supine to sit: Independent Sit to supine: Min assist   General bed mobility comments: Min A with RLE back to bed during sit to  sup. Sitting at EOB caused RLE pain to increase steadily.  Transfers Overall transfer level: Needs assistance     Sit to Stand: Min assist         General transfer comment: Pt stood with min A, but quickly had to sit again 2/2 pain in RLE  Ambulation/Gait                 Stairs             Wheelchair Mobility    Modified Rankin (Stroke Patients Only)       Balance Overall balance assessment: Needs assistance Sitting-balance support: Bilateral upper extremity supported;Feet unsupported Sitting balance-Leahy Scale: Fair     Standing balance support: Bilateral upper extremity supported Standing balance-Leahy Scale: Fair Standing balance comment: Heavy lean on the RW for support in standing                            Cognition Arousal/Alertness: Awake/alert Behavior During Therapy: WFL for tasks assessed/performed Overall Cognitive Status: Within Functional Limits for tasks assessed                                        Exercises Total Joint Exercises Ankle Circles/Pumps: AROM;Both;Strengthening;20 reps;AAROM Quad Sets: Strengthening;Both;10 reps Gluteal Sets: Strengthening;Both;10 reps Heel Slides: AROM;Left;10 reps Hip ABduction/ADduction: AROM;Left;10 reps Long Arc Quad: AROM;Both;5 reps;10 reps;Other (comment) Knee Flexion: AROM;Both;5 reps;10 reps;Other (comment) Other Exercises Other Exercises: HEP education/review for BLE APs, GS, QS, and LAQs x  10 each 5-6x/day as tolerated    General Comments        Pertinent Vitals/Pain Pain Assessment: 0-10 Pain Score: 9  Pain Location: Pain was a 0/10 at rest, and rose to 9-10/10 with standing and WB on RLE Pain Descriptors / Indicators: Constant;Guarding Pain Intervention(s): Limited activity within patient's tolerance;Monitored during session;Premedicated before session    Home Living Family/patient expects to be discharged to:: Private residence Living  Arrangements: Alone Available Help at Discharge: Family;Available PRN/intermittently;Friend(s) Type of Home: Apartment Home Access: Stairs to enter Entrance Stairs-Rails: Right Home Layout: One level Home Equipment: None Additional Comments: Level entry into apt but has 2 steps with R rail to come up from the parking area    Prior Function        Comments: Pt indep with amb without an AD community distances, Ind with mobility and ADL/IADL tasks. Endorses 1 fall in past 12 months 2/2 tripping over a curb with her dogs.   PT Goals (current goals can now be found in the care plan section) Progress towards PT goals: Progressing toward goals    Frequency    Min 2X/week      PT Plan Current plan remains appropriate    Co-evaluation              AM-PAC PT "6 Clicks" Mobility   Outcome Measure  Help needed turning from your back to your side while in a flat bed without using bedrails?: None Help needed moving from lying on your back to sitting on the side of a flat bed without using bedrails?: A Little Help needed moving to and from a bed to a chair (including a wheelchair)?: A Little Help needed standing up from a chair using your arms (e.g., wheelchair or bedside chair)?: A Little Help needed to walk in hospital room?: Total Help needed climbing 3-5 steps with a railing? : Total 6 Click Score: 15    End of Session Equipment Utilized During Treatment: Gait belt Activity Tolerance: Patient limited by pain Patient left: in bed;with call bell/phone within reach Nurse Communication: Mobility status PT Visit Diagnosis: Difficulty in walking, not elsewhere classified (R26.2);Muscle weakness (generalized) (M62.81)     Time: 1422-1500 PT Time Calculation (min) (ACUTE ONLY): 38 min  Charges:                        Juanda Crumble "Gus" Jeannette Corpus, SPT  06/03/19, 3:49 PM

## 2019-06-04 ENCOUNTER — Encounter
Admission: RE | Disposition: A | Discharge status: Home / Self Care | Payer: Self-pay | Source: Ambulatory Visit | Attending: Vascular Surgery

## 2019-06-04 ENCOUNTER — Encounter: Payer: Self-pay | Admitting: Anesthesiology

## 2019-06-04 ENCOUNTER — Inpatient Hospital Stay: Payer: Medicare Other | Admitting: Anesthesiology

## 2019-06-04 DIAGNOSIS — I70233 Atherosclerosis of native arteries of right leg with ulceration of ankle: Secondary | ICD-10-CM

## 2019-06-04 HISTORY — PX: FASCIOTOMY CLOSURE: SHX5829

## 2019-06-04 LAB — CBC
HCT: 31.9 % — ABNORMAL LOW (ref 36.0–46.0)
Hemoglobin: 10.1 g/dL — ABNORMAL LOW (ref 12.0–15.0)
MCH: 27.8 pg (ref 26.0–34.0)
MCHC: 31.7 g/dL (ref 30.0–36.0)
MCV: 87.9 fL (ref 80.0–100.0)
Platelets: 180 10*3/uL (ref 150–400)
RBC: 3.63 MIL/uL — ABNORMAL LOW (ref 3.87–5.11)
RDW: 15.9 % — ABNORMAL HIGH (ref 11.5–15.5)
WBC: 7.4 10*3/uL (ref 4.0–10.5)
nRBC: 0 % (ref 0.0–0.2)

## 2019-06-04 LAB — PROTIME-INR
INR: 1 (ref 0.8–1.2)
Prothrombin Time: 13.5 seconds (ref 11.4–15.2)

## 2019-06-04 LAB — BASIC METABOLIC PANEL
Anion gap: 7 (ref 5–15)
BUN: 10 mg/dL (ref 8–23)
CO2: 26 mmol/L (ref 22–32)
Calcium: 8.3 mg/dL — ABNORMAL LOW (ref 8.9–10.3)
Chloride: 108 mmol/L (ref 98–111)
Creatinine, Ser: 0.79 mg/dL (ref 0.44–1.00)
GFR calc Af Amer: >60 mL/min (ref >60)
GFR calc non Af Amer: >60 mL/min (ref >60)
Glucose, Bld: 102 mg/dL — ABNORMAL HIGH (ref 70–99)
Potassium: 4.3 mmol/L (ref 3.5–5.1)
Sodium: 141 mmol/L (ref 135–145)

## 2019-06-04 LAB — TYPE AND SCREEN
ABO/RH(D): O POS
Antibody Screen: NEGATIVE

## 2019-06-04 LAB — MAGNESIUM: Magnesium: 2.1 mg/dL (ref 1.7–2.4)

## 2019-06-04 LAB — APTT: aPTT: 33 seconds (ref 24–36)

## 2019-06-04 SURGERY — FASCIOTOMY CLOSURE
Anesthesia: General | Site: Leg Lower | Laterality: Right

## 2019-06-04 MED ORDER — LACTATED RINGERS IV SOLN
INTRAVENOUS | Status: DC | PRN
  Administered 2019-06-04: 10:00:00 via INTRAVENOUS

## 2019-06-04 MED ORDER — ETOMIDATE 2 MG/ML IV SOLN
INTRAVENOUS | Status: DC | PRN
  Administered 2019-06-04: 12 mg via INTRAVENOUS

## 2019-06-04 MED ORDER — CLINDAMYCIN PHOSPHATE 600 MG/50ML IV SOLN
INTRAVENOUS | Status: AC
  Administered 2019-06-04: 14:00:00
  Filled 2019-06-04: qty 50

## 2019-06-04 MED ORDER — APIXABAN 5 MG PO TABS
5.0000 mg | ORAL_TABLET | Freq: Two times a day (BID) | ORAL | Status: DC
  Administered 2019-06-04 – 2019-06-07 (×6): 5 mg via ORAL
  Filled 2019-06-04 (×6): qty 1

## 2019-06-04 MED ORDER — ONDANSETRON HCL 4 MG/2ML IJ SOLN: 4.0000 mg | Freq: Once | INTRAMUSCULAR | Status: DC | PRN

## 2019-06-04 MED ORDER — ROCURONIUM BROMIDE 100 MG/10ML IV SOLN
INTRAVENOUS | Status: DC | PRN
  Administered 2019-06-04: 30 mg via INTRAVENOUS

## 2019-06-04 MED ORDER — FENTANYL CITRATE (PF) 100 MCG/2ML IJ SOLN: 25.0000 ug | INTRAMUSCULAR | Status: DC | PRN

## 2019-06-04 MED ORDER — FENTANYL CITRATE (PF) 100 MCG/2ML IJ SOLN
INTRAMUSCULAR | Status: AC
  Filled 2019-06-04: qty 2

## 2019-06-04 MED ORDER — SUGAMMADEX SODIUM 500 MG/5ML IV SOLN
INTRAVENOUS | Status: DC | PRN
  Administered 2019-06-04: 113.4 mg via INTRAVENOUS

## 2019-06-04 MED ORDER — PHENYLEPHRINE HCL (PRESSORS) 10 MG/ML IV SOLN
INTRAVENOUS | Status: DC | PRN
  Administered 2019-06-04: 100 ug via INTRAVENOUS

## 2019-06-04 MED ORDER — FENTANYL CITRATE (PF) 100 MCG/2ML IJ SOLN
INTRAMUSCULAR | Status: DC | PRN
  Administered 2019-06-04 (×4): 25 ug via INTRAVENOUS

## 2019-06-04 MED ORDER — LIDOCAINE HCL (CARDIAC) PF 100 MG/5ML IV SOSY
PREFILLED_SYRINGE | INTRAVENOUS | Status: DC | PRN
  Administered 2019-06-04: 50 mg via INTRAVENOUS

## 2019-06-04 SURGICAL SUPPLY — 39 items
BLADE SURG SZ10 CARB STEEL (BLADE) ×2 IMPLANT
BNDG COHESIVE 4X5 TAN STRL (GAUZE/BANDAGES/DRESSINGS) IMPLANT
BNDG ELASTIC 6X5.8 VLCR NS LF (GAUZE/BANDAGES/DRESSINGS) IMPLANT
BNDG GAUZE 4.5X4.1 6PLY STRL (MISCELLANEOUS) ×2 IMPLANT
CANISTER SUCT 1200ML W/VALVE (MISCELLANEOUS) ×2 IMPLANT
CANISTER WOUND CARE 500ML ATS (WOUND CARE) IMPLANT
CHLORAPREP W/TINT 26 (MISCELLANEOUS) ×2 IMPLANT
COVER WAND RF STERILE (DRAPES) IMPLANT
DRESSING SURGICEL FIBRLLR 1X2 (HEMOSTASIS) IMPLANT
DRSG GAUZE FLUFF 36X18 (GAUZE/BANDAGES/DRESSINGS) IMPLANT
DRSG SURGICEL FIBRILLAR 1X2 (HEMOSTASIS)
DRSG VAC ATS MED SENSATRAC (GAUZE/BANDAGES/DRESSINGS) IMPLANT
ELECT CAUTERY BLADE 6.4 (BLADE) ×2 IMPLANT
ELECT REM PT RETURN 9FT ADLT (ELECTROSURGICAL) ×2
ELECTRODE REM PT RTRN 9FT ADLT (ELECTROSURGICAL) ×1 IMPLANT
GAUZE XEROFORM 1X8 LF (GAUZE/BANDAGES/DRESSINGS) IMPLANT
GLOVE SURG SYN 8.0 (GLOVE) ×2 IMPLANT
GOWN STRL REUS W/ TWL LRG LVL3 (GOWN DISPOSABLE) ×1 IMPLANT
GOWN STRL REUS W/ TWL XL LVL3 (GOWN DISPOSABLE) ×1 IMPLANT
GOWN STRL REUS W/TWL LRG LVL3 (GOWN DISPOSABLE) ×1
GOWN STRL REUS W/TWL XL LVL3 (GOWN DISPOSABLE) ×1
HANDLE YANKAUER SUCT BULB TIP (MISCELLANEOUS) ×2 IMPLANT
KIT TURNOVER KIT A (KITS) ×2 IMPLANT
NS IRRIG 500ML POUR BTL (IV SOLUTION) ×2 IMPLANT
PACK EXTREMITY ARMC (MISCELLANEOUS) ×2 IMPLANT
PAD ABD DERMACEA PRESS 5X9 (GAUZE/BANDAGES/DRESSINGS) ×4 IMPLANT
PAD PREP 24X41 OB/GYN DISP (PERSONAL CARE ITEMS) IMPLANT
SPONGE LAP 18X18 RF (DISPOSABLE) ×2 IMPLANT
STAPLER SKIN PROX 35W (STAPLE) IMPLANT
STOCKINETTE M/LG 89821 (MISCELLANEOUS) ×2 IMPLANT
SUT ETHIBOND 0 36 GRN (SUTURE) IMPLANT
SUT ETHILON 3-0 FS-10 30 BLK (SUTURE) ×10
SUT SILK 2 0 (SUTURE)
SUT SILK 2-0 18XBRD TIE 12 (SUTURE) IMPLANT
SUT VIC AB 0 CT1 36 (SUTURE) IMPLANT
SUT VIC AB 3-0 SH 27 (SUTURE)
SUT VIC AB 3-0 SH 27X BRD (SUTURE) IMPLANT
SUT VICRYL PLUS ABS 0 54 (SUTURE) IMPLANT
SUTURE EHLN 3-0 FS-10 30 BLK (SUTURE) ×5 IMPLANT

## 2019-06-04 NOTE — Progress Notes (Signed)
Physical Therapy Treatment Patient Details Name: Sara [name] MRN: [id number] DOB: [birth year] Today's Date: 06/04/2019    History of Present Illness Pt is a 68 yo female diagnosed with Atherosclerotic occlusive disease bilateral lower extremities with right lower extremity ischemia.  Pt is s/p revascularization and subsequent repair of RLE compartment syndrome on 10/9. Final closure of fasciotomy 10/14. PMH includes: CAD, HTN, PVD, depression, GERD, HLD, carotid artery stenosis, and BPPV.    PT Comments    Pt presented with deficits in strength, transfers, gait, balance, and activity tolerance. Pt's pain at rest was 1/10 and was 6/10 at end of session after ambulating around her room, and was slowly decreasing toward baseline. Pt completed bed exercises and bed mobility Ind. Pt required supervision for sit<>stand transfers out of bed and into recliner, and close CGA for safety during ambulation around the room. Pt used a toe-touch WB pattern on her R foot 2/2 pain with DF. Pt will benefit from PT services in a SNF setting upon discharge to safely address above deficits for decreased caregiver assistance and eventual return to PLOF.   Follow Up Recommendations  SNF     Equipment Recommendations  Other (comment)(TBD at next venue of care (Pt owns no DME))    Recommendations for Other Services       Precautions / Restrictions Precautions Precautions: Fall Precaution Comments: RLE fasciotomies Restrictions Weight Bearing Restrictions: Yes RLE Weight Bearing: Partial weight bearing Other Position/Activity Restrictions: No WB orders noted in chart. Nursing stated was told PWB during report, text sent to Dr. Delana Meyer to clarify 10/14, awaiting reply.    Mobility  Bed Mobility Overal bed mobility: Independent                Transfers Overall transfer level: Needs assistance Equipment used: Rolling walker (2 wheeled) Transfers: Sit to/from Stand Sit to Stand: Supervision         General transfer comment: Pt stood with min cuing  Ambulation/Gait Ambulation/Gait assistance: Min guard Gait Distance (Feet): 20 Feet Assistive device: Rolling walker (2 wheeled) Gait Pattern/deviations: Step-to pattern;Decreased step length - right;Decreased step length - left Gait velocity: Decreased   General Gait Details: Pt used toe-touch WB pattern on RLE 2/2 pain.   Stairs             Wheelchair Mobility    Modified Rankin (Stroke Patients Only)       Balance Overall balance assessment: Needs assistance Sitting-balance support: No upper extremity supported;Feet unsupported Sitting balance-Leahy Scale: Good     Standing balance support: Bilateral upper extremity supported;During functional activity Standing balance-Leahy Scale: Fair Standing balance comment: Mod lean on the RW for support in standing                            Cognition Arousal/Alertness: Awake/alert Behavior During Therapy: WFL for tasks assessed/performed Overall Cognitive Status: Within Functional Limits for tasks assessed                                        Exercises Total Joint Exercises Ankle Circles/Pumps: AROM;Strengthening;Both;10 reps;15 reps Quad Sets: Strengthening;Both;10 reps;15 reps Gluteal Sets: Strengthening;Both;10 reps Heel Slides: AROM;Left;5 reps Hip ABduction/ADduction: AROM;Left;10 reps;15 reps Long Arc Quad: AROM;Both;5 reps;10 reps Knee Flexion: AROM;Both;5 reps;10 reps Marching in Standing: AROM;5 reps;Both    General Comments        Pertinent Vitals/Pain Pain  Assessment: 0-10 Pain Score: 6  Pain Location: Pain was a 1/10 at rest, and rose to 6/10 with ambulation using RW. Pain Descriptors / Indicators: Constant;Sharp;Grimacing Pain Intervention(s): Limited activity within patient's tolerance;Monitored during session    Home Living                      Prior Function            PT Goals (current goals  can now be found in the care plan section) Progress towards PT goals: Progressing toward goals    Frequency    Min 2X/week      PT Plan Current plan remains appropriate    Co-evaluation              AM-PAC PT "6 Clicks" Mobility   Outcome Measure  Help needed turning from your back to your side while in a flat bed without using bedrails?: None Help needed moving from lying on your back to sitting on the side of a flat bed without using bedrails?: None Help needed moving to and from a bed to a chair (including a wheelchair)?: A Little Help needed standing up from a chair using your arms (e.g., wheelchair or bedside chair)?: A Little Help needed to walk in hospital room?: A Little Help needed climbing 3-5 steps with a railing? : A Lot 6 Click Score: 19    End of Session Equipment Utilized During Treatment: Gait belt Activity Tolerance: Patient limited by pain Patient left: in bed;with call bell/phone within reach Nurse Communication: Mobility status PT Visit Diagnosis: Difficulty in walking, not elsewhere classified (R26.2);Muscle weakness (generalized) (M62.81);Pain Pain - Right/Left: Right Pain - part of body: Ankle and joints of foot     Time: [id number] PT Time Calculation (min) (ACUTE ONLY): 24 min  Charges:                        Juanda Crumble "Gus" Jeannette Corpus, SPT  06/04/19, 5:26 PM

## 2019-06-04 NOTE — Anesthesia Procedure Notes (Signed)
Procedure Name: Intubation Date/Time: 06/04/2019 11:40 AM Performed by: Timoteo Expose, CRNA Pre-anesthesia Checklist: Patient identified, Emergency Drugs available, Suction available and Patient being monitored Patient Re-evaluated:Patient Re-evaluated prior to induction Oxygen Delivery Method: Circle system utilized Preoxygenation: Pre-oxygenation with 100% oxygen Induction Type: IV induction Ventilation: Mask ventilation without difficulty Laryngoscope Size: McGraph and 3 Tube type: Oral Tube size: 6.5 mm Number of attempts: 1 Airway Equipment and Method: Stylet and Oral airway Placement Confirmation: ETT inserted through vocal cords under direct vision,  positive ETCO2 and breath sounds checked- equal and bilateral Tube secured with: Tape Dental Injury: Teeth and Oropharynx as per pre-operative assessment

## 2019-06-04 NOTE — Transfer of Care (Signed)
Immediate Anesthesia Transfer of Care Note  Patient: Sara [name]  Procedure(s) Performed: FASCIOTOMY CLOSURE (Right Leg Lower)  Patient Location: PACU  Anesthesia Type:General  Level of Consciousness: awake  Airway & Oxygen Therapy: Patient Spontanous Breathing  Post-op Assessment: Report given to RN  Post vital signs: Reviewed  Last Vitals:  Vitals Value Taken Time  BP 123/70 06/04/19 1133  Temp 36.2 C 06/04/19 1133  Pulse 85 06/04/19 1133  Resp 16 06/04/19 1133  SpO2 96 % 06/04/19 1133    Last Pain:  Vitals:   06/04/19 1133  TempSrc:   PainSc: 0-No pain      Patients Stated Pain Goal: 2 ([id number])  Complications: No apparent anesthesia complications

## 2019-06-04 NOTE — Anesthesia Post-op Follow-up Note (Signed)
Anesthesia QCDR form completed.        

## 2019-06-04 NOTE — Consult Note (Signed)
ANTICOAGULATION CONSULT NOTE - Initial Consult  Pharmacy Consult for Apixaban Dosing Indication: Right Lower Extremity Ischemia  Allergies  Allergen Reactions  . Hydrocodone-Acetaminophen     Other reaction(s): Flushing  . Amoxicillin Other (See Comments)    Yeast infection  . Vicodin [Hydrocodone-Acetaminophen] Hives and Rash    Patient Measurements: Height: 5\' 3"  (160 cm) Weight: 125 lb (56.7 kg) IBW/kg (Calculated) : 52.4 Heparin Dosing Weight: N/A  Vital Signs: Temp: 98 F (36.7 C) (10/14 1237) Temp Source: Tympanic (10/14 0934) BP: 102/56 (10/14 1237) Pulse Rate: 80 (10/14 1237)  Labs: Recent Labs    06/01/19 1444 06/02/19 0452 06/04/19 0437  HGB 9.9* 9.0* 10.1*  HCT 30.7* 28.0* 31.9*  PLT  --  108* 180  APTT  --   --  33  LABPROT  --   --  13.5  INR  --   --  1.0  CREATININE  --  0.63 0.79    Estimated Creatinine Clearance: 57.2 mL/min (by C-G formula based on SCr of 0.79 mg/dL).   Medical History: Past Medical History:  Diagnosis Date  . Depression   . GERD (gastroesophageal reflux disease)   . History of blood clots   . Hyperlipidemia   . Hypertension   . Mild mitral regurgitation   . Osteoporosis   . Stomach ulcer   . Vascular disease    Sees Dr. Delana Meyer  . Vertigo    Last episode approx Aug 2015  . Wears dentures    full upper    Medications:  Medications Prior to Admission  Medication Sig Dispense Refill Last Dose  . atorvastatin (LIPITOR) 40 MG tablet Take 40 mg by mouth at bedtime.    05/29/2019 at Unknown time  . calcium-vitamin D (OSCAL WITH D) 500-200 MG-UNIT tablet Take 2 tablets by mouth daily with breakfast.   05/29/2019 at Unknown time  . clopidogrel (PLAVIX) 75 MG tablet Take 1 tablet (75 mg total) by mouth daily. 90 tablet 3 05/28/2019 at Unknown time  . losartan (COZAAR) 25 MG tablet Take 25 mg by mouth daily.    05/30/2019 at Unknown time  . omeprazole (PRILOSEC) 40 MG capsule Take 1 capsule (40 mg total) by mouth daily. 90  capsule 3 05/30/2019 at Unknown time  . traMADol (ULTRAM) 50 MG tablet TAKE 1 TABLET BY MOUTH EVERY 6 HOURS FOR PAIN FILL 05/14/19   05/30/2019 at Unknown time  . venlafaxine XR (EFFEXOR-XR) 75 MG 24 hr capsule TAKE 1 CAPSULE BY MOUTH  DAILY WITH BREAKFAST 90 capsule 3 05/30/2019 at Unknown time  . ibandronate (BONIVA) 150 MG tablet Take 150 mg by mouth every 30 (thirty) days.    05/24/2019   Scheduled:  . acidophilus  2 capsule Oral TID  . apixaban  5 mg Oral BID  . calcium-vitamin D  2 tablet Oral Q breakfast  . Chlorhexidine Gluconate Cloth  6 each Topical Daily  . clopidogrel  75 mg Oral Daily  . docusate sodium  100 mg Oral Daily  . losartan  25 mg Oral Daily  . oxyCODONE  10 mg Oral Q12H  . pantoprazole  40 mg Oral Daily  . polyethylene glycol  17 g Oral Daily  . venlafaxine XR  75 mg Oral Q breakfast   Infusions:  . sodium chloride 75 mL/hr at 06/04/19 1144  . clindamycin    . magnesium sulfate bolus IVPB     PRN: acetaminophen, alum & mag hydroxide-simeth, magnesium sulfate bolus IVPB, morphine injection, ondansetron (ZOFRAN) IV, oxyCODONE,  potassium chloride, senna-docusate, sorbitol Anti-infectives (From admission, onward)   Start     Dose/Rate Route Frequency Ordered Stop   06/04/19 0948  clindamycin (CLEOCIN) 600 MG/50ML IVPB    Note to Pharmacy: Nyra Jabs   : cabinet override      06/04/19 0948 06/04/19 2159   06/04/19 0700  clindamycin (CLEOCIN) IVPB 600 mg    Note to Pharmacy: To be given in OR   600 mg 100 mL/hr over 30 Minutes Intravenous  Once 06/03/19 2246 06/04/19 1040   05/31/19 0200  clindamycin (CLEOCIN) capsule 300 mg     300 mg Oral Every 8 hours 05/31/19 0139 05/31/19 1740   05/31/19 0000  clindamycin (CLEOCIN) IVPB 300 mg  Status:  Discontinued    Note to Pharmacy: To be given in OR   300 mg 100 mL/hr over 30 Minutes Intravenous  Once 05/30/19 1716 05/31/19 0134   05/30/19 0940  ceFAZolin (ANCEF) 2-4 GM/100ML-% IVPB    Note to Pharmacy: Despina Arias  : cabinet override      05/30/19 0940 05/30/19 1122   05/29/19 2230  ceFAZolin (ANCEF) IVPB 2g/100 mL premix  Status:  Discontinued     2 g 200 mL/hr over 30 Minutes Intravenous  Once 05/29/19 2218 05/30/19 1413      Assessment: Pharmacy has been consulted to initiate Apixaban Therapy on 68yo patient post closure of lower extremity fasciotomies. Patient is status post-fasciotomy 5 days ago.   Goal of Therapy:  Monitor platelets by anticoagulation protocol: Yes   Plan:  After discussion with Vascular Surgeon, will initiate therapy tonight. Will order Apixaban 5mg  PO BID.  Will continue to monitor H&H with AM labs.  Heber Hoog A Avika Carbine 06/04/2019,1:37 PM

## 2019-06-04 NOTE — Op Note (Signed)
    OPERATIVE NOTE   PROCEDURE: Closure of right lower extremity fasciotomies  PRE-OPERATIVE DIAGNOSIS: Compartment syndrome status post fasciotomies  POST-OPERATIVE DIAGNOSIS: Same  SURGEON: [name]  ASSISTANT(S): None  ANESTHESIA: general  ESTIMATED BLOOD LOSS: Less than 10 cc  FINDING(S): Moderate degree of tension in the midportion of both closures  SPECIMEN(S): None  INDICATIONS:   Sara [name] is a 68 y.o. female who presents with open fasciotomy sites.  She is status post fasciotomy 5 days ago.  Her wounds are healthy her bypass remains patent.  She is therefore undergoing closure in the operating room.  Risks and benefits of been reviewed all questions answered patient agrees to proceed..  DESCRIPTION: After full informed written consent was obtained from the patient, the patient was brought back to the operating room and placed supine upon the operating table.  Prior to induction, the patient received IV antibiotics.   After obtaining adequate anesthesia, the patient was then prepped and draped in the standard fashion for a closure of medial and lateral fasciotomy incisions right lower extremity.  Beginning with the medial incision the wound is irrigated with saline.  I then placed a interrupted vertical mattress suture in the midportion of the wound.  The suture was under moderate tension.  I then completed closure with interrupted 3-0 nylon vertical mattress sutures working from the medial portion of the wound to the edges.  The leg was then rotated medially and the lateral incision was addressed.  Again it was washed with saline and then beginning in the middle of the wound closed with interrupted 3-0 nylon sutures with a vertical mattress type.  Xeroform followed by ABD followed by Kerlix was then placed.  At the completion of the procedure the patient maintains a 3+ palpable pulse in her bypass graft.   The patient tolerated this procedure well.    COMPLICATIONS: None  CONDITION: Sara [name] [facility]  Office: [phone]   06/04/2019, 11:42 AM

## 2019-06-04 NOTE — Anesthesia Preprocedure Evaluation (Signed)
Anesthesia Evaluation  Patient identified by MRN, date of birth, ID band Patient awake    Reviewed: Allergy & Precautions, NPO status , Patient's Chart, lab work & pertinent test results  Airway Mallampati: III       Dental  (+) Upper Dentures, Lower Dentures   Pulmonary former smoker,    Pulmonary exam normal        Cardiovascular hypertension, + CAD and + Peripheral Vascular Disease  Normal cardiovascular exam+ Valvular Problems/Murmurs MR      Neuro/Psych PSYCHIATRIC DISORDERS Depression  Neuromuscular disease    GI/Hepatic Neg liver ROS, hiatal hernia, PUD, GERD  ,  Endo/Other  negative endocrine ROS  Renal/GU negative Renal ROS  negative genitourinary   Musculoskeletal   Abdominal Normal abdominal exam  (+)   Peds negative pediatric ROS (+)  Hematology   Anesthesia Other Findings Past Medical History: No date: Depression No date: GERD (gastroesophageal reflux disease) No date: History of blood clots No date: Hyperlipidemia No date: Hypertension No date: Mild mitral regurgitation No date: Osteoporosis No date: Stomach ulcer No date: Vascular disease     Comment:  Sees Dr. Delana Meyer No date: Vertigo     Comment:  Last episode approx Aug 2015 No date: Wears dentures     Comment:  full upper  Reproductive/Obstetrics                             Anesthesia Physical  Anesthesia Plan  ASA: III  Anesthesia Plan: General   Post-op Pain Management:    Induction: Intravenous, Rapid sequence and Cricoid pressure planned  PONV Risk Score and Plan:   Airway Management Planned: Oral ETT  Additional Equipment:   Intra-op Plan:   Post-operative Plan: Extubation in OR  Informed Consent: I have reviewed the patients History and Physical, chart, labs and discussed the procedure including the risks, benefits and alternatives for the proposed anesthesia with the patient or authorized  representative who has indicated his/her understanding and acceptance.     Dental advisory given  Plan Discussed with: CRNA and Surgeon  Anesthesia Plan Comments:         Anesthesia Quick Evaluation

## 2019-06-04 NOTE — Plan of Care (Signed)
  Problem: Skin Integrity: Goal: Risk for impaired skin integrity will decrease Outcome: Progressing  Post op dressing in place. Dressing is clean, dry and intact.

## 2019-06-05 ENCOUNTER — Encounter: Payer: Self-pay | Admitting: Vascular Surgery

## 2019-06-05 MED ORDER — DOCUSATE SODIUM 100 MG PO CAPS
100.0000 mg | ORAL_CAPSULE | Freq: Two times a day (BID) | ORAL | Status: DC
  Administered 2019-06-05 – 2019-06-07 (×4): 100 mg via ORAL
  Filled 2019-06-05 (×4): qty 1

## 2019-06-05 MED ORDER — OXYCODONE HCL 5 MG PO TABS
10.0000 mg | ORAL_TABLET | ORAL | Status: DC | PRN
  Administered 2019-06-07: 10 mg via ORAL
  Filled 2019-06-05: qty 2

## 2019-06-05 MED ORDER — KETOROLAC TROMETHAMINE 30 MG/ML IJ SOLN
30.0000 mg | Freq: Four times a day (QID) | INTRAMUSCULAR | Status: DC
  Administered 2019-06-05 – 2019-06-07 (×8): 30 mg via INTRAVENOUS
  Filled 2019-06-05 (×8): qty 1

## 2019-06-05 NOTE — Progress Notes (Signed)
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  Pt is s/p revascularization and subsequent repair of RLE compartment syndrome on 10/9. Final closure of fasciotomy 10/14. PMH includes: CAD, HTN, PVD, depression, GERD, HLD, carotid artery stenosis, and BPPV.    PT Comments    Pt presented with deficits in strength, transfers, mobility, gait, balance, and activity tolerance. Pt's pain in well-controlled when at rest, 0/10, but shooting pain with DF and/or WB of RLE, and was 1/10 after functional activity. Pt completed bed exercises and bed mobility Ind. Pt required supervision for sit<>stand transfers, and CGA for ambulation around the room, 25 ft. Pt transported to and from PT gym for stair education (pt has 2 steps to get into her apt). Pt completed 1 step ascent/descent with +2 mod A and buckled on the way up and down 2/2 pain in RLE. Pt will benefit from PT services in a SNF setting upon discharge to safely address above deficits for decreased caregiver assistance and eventual return to PLOF.    Follow Up Recommendations  SNF     Equipment Recommendations  Other (comment)(TBD at next venue of care (Pt owns no DME))    Recommendations for Other Services       Precautions / Restrictions Precautions Precautions: Fall Precaution Comments: RLE fasciotomies, closed Restrictions Weight Bearing Restrictions: Yes RLE Weight Bearing: Weight bearing as tolerated Other Position/Activity Restrictions: Per Dr. Delana Meyer, all incisions are closed and pt is WBAT.    Mobility  Bed Mobility Overal bed mobility: Independent Bed Mobility: Supine to Sit;Sit to Supine     Supine to sit: Independent Sit to supine: Independent      Transfers Overall transfer  level: Needs assistance Equipment used: Rolling walker (2 wheeled) Transfers: Sit to/from Stand Sit to Stand: Supervision         General transfer comment: Pt stood with min cuing  Ambulation/Gait Ambulation/Gait assistance: Min guard Gait Distance (Feet): 25 Feet   Gait Pattern/deviations: Step-to pattern;Decreased step length - right;Decreased step length - left Gait velocity: Decreased   General Gait Details: Pt used toe-touch WB pattern on RLE 2/2 pain.   Stairs Stairs: Yes Stairs assistance: Mod assist;+2 physical assistance Stair Management: Two rails Number of Stairs: 1 General stair comments: Pt buckled with ascent and descent from 1 step.   Wheelchair Mobility    Modified Rankin (Stroke Patients Only)       Balance Overall balance assessment: Needs assistance Sitting-balance support: No upper extremity supported;Feet unsupported Sitting balance-Leahy Scale: Good     Standing balance support: Bilateral upper extremity supported;During functional activity Standing balance-Leahy Scale: Fair Standing balance comment: Mod lean on the RW for support in standing                            Cognition Arousal/Alertness: Awake/alert Behavior During Therapy: WFL for tasks assessed/performed Overall Cognitive Status: Within Functional Limits for tasks assessed                                        Exercises Total Joint Exercises Ankle Circles/Pumps: AROM;Strengthening;Both;10 reps;15 reps Quad Sets: Strengthening;Both;10 reps;15 reps Towel Squeeze: Strengthening;Both;10 reps Heel Slides: AROM;5 reps;Both Hip ABduction/ADduction:  AROM;Both;10 reps;15 reps Long Arc Quad: AROM;Both;5 reps;10 reps Knee Flexion: AROM;Both;5 reps;10 reps Marching in Standing: AROM;Both;10 reps Other Exercises Other Exercises: PROM and Active Release Technique to stretch muscle and fascia in RLE into DF. Other Exercises: Functional transfer training to  Lamar Comments        Pertinent Vitals/Pain Pain Assessment: 0-10 Pain Score: 1  Pain Location: no pain at rest, increases with any WBing attempts or DF Pain Descriptors / Indicators: Sharp Pain Intervention(s): Limited activity within patient's tolerance;Monitored during session;Repositioned    Home Living                      Prior Function            PT Goals (current goals can now be found in the care plan section) Acute Rehab PT Goals Patient Stated Goal: To be able to walk and do the things I used to do Potential to Achieve Goals: Good Progress towards PT goals: Progressing toward goals    Frequency    Min 2X/week      PT Plan Current plan remains appropriate    Co-evaluation              AM-PAC PT "6 Clicks" Mobility   Outcome Measure  Help needed turning from your back to your side while in a flat bed without using bedrails?: None Help needed moving from lying on your back to sitting on the side of a flat bed without using bedrails?: None Help needed moving to and from a bed to a chair (including a wheelchair)?: A Little Help needed standing up from a chair using your arms (e.g., wheelchair or bedside chair)?: A Little Help needed to walk in hospital room?: A Little Help needed climbing 3-5 steps with a railing? : Total 6 Click Score: 18    End of Session Equipment Utilized During Treatment: Gait belt Activity Tolerance: Patient limited by pain Patient left: in bed;with call bell/phone within reach Nurse Communication: Mobility status PT Visit Diagnosis: Difficulty in walking, not elsewhere classified (R26.2);Muscle weakness (generalized) (M62.81);Pain Pain - Right/Left: Right Pain - part of body: Ankle and joints of foot     Time: 1520-1609 PT Time Calculation (min) (ACUTE ONLY): 49 min  Charges:                        Juanda Crumble "Gus" Jeannette Corpus, SPT  06/05/19, 5:37 PM

## 2019-06-05 NOTE — Progress Notes (Signed)
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  Pt is s/p revascularization and subsequent repair of RLE compartment syndrome on 10/9. Final closure of fasciotomy 10/14. PMH includes: CAD, HTN, PVD, depression, GERD, HLD, carotid artery stenosis, and BPPV.   OT comments  Pt seen for OT tx this date. Pt reporting significant improvement in pain now with fasciotomies closed, however, continues to have significant pain with DF and weightbearing. Pt able to transfer with RW and heavy BUE support from EOB to [facility] for toileting task. Set up and supervision for hygiene. Pt required cues for RW mgt to improve safety. Pt progressing well towards goals, continues to benefit from skilled OT Services.    Follow Up Recommendations  SNF    Equipment Recommendations  3 in 1 bedside commode;Other (comment)(reacher, 2WW)    Recommendations for Other Services      Precautions / Restrictions Precautions Precautions: Fall Precaution Comments: RLE fasciotomies, closed Restrictions Weight Bearing Restrictions: Yes RLE Weight Bearing: Partial weight bearing Other Position/Activity Restrictions: No WB orders noted in chart. Nursing stated was told PWB during report, text sent to Dr. Delana Meyer to clarify 10/14, awaiting reply.       Mobility Bed Mobility Overal bed mobility: Independent Bed Mobility: Supine to Sit;Sit to Supine     Supine to sit: Independent Sit to supine: Independent      Transfers Overall transfer level: Needs assistance Equipment used: Rolling walker (2 wheeled) Transfers: Sit to/from Stand Sit to Stand: Supervision;Min guard              Balance Overall balance assessment: Needs assistance Sitting-balance support: No upper extremity  supported;Feet unsupported Sitting balance-Leahy Scale: Good     Standing balance support: Bilateral upper extremity supported;During functional activity Standing balance-Leahy Scale: Fair                             ADL either performed or assessed with clinical judgement   ADL Overall ADL's : Needs assistance/impaired                         Toilet Transfer: Min Forensic psychologist Details (indicate cue type and reason): cues for RW mgt to improve safety, BSC next to bed Toileting- Clothing Manipulation and Hygiene: Set up;Min guard;Sit to/from stand               Vision Baseline Vision/History: Wears glasses Wears Glasses: At all times Patient Visual Report: No change from baseline Vision Assessment?: No apparent visual deficits   Perception     Praxis      Cognition Arousal/Alertness: Awake/alert Behavior During Therapy: WFL for tasks assessed/performed Overall Cognitive Status: Within Functional Limits for tasks assessed                                          Exercises Other Exercises Other Exercises: Functional transfer training to [facility] - Eat   Shoulder Instructions       General Comments      Pertinent Vitals/ Pain       Pain Assessment: No/denies pain Pain Location: no pain at rest, increases with any WBing attempts or DF  Pain Descriptors / Indicators: Aching Pain Intervention(s): Limited activity within patient's tolerance;Monitored during session;Repositioned  Home Living                                          Prior Functioning/Environment              Frequency  Min 2X/week        Progress Toward Goals  OT Goals(current goals can now be found in the care plan section)  Progress towards OT goals: Progressing toward goals  Acute Rehab OT Goals Patient Stated Goal: To be able to walk and do the things I used to do OT Goal Formulation: With patient Time For Goal  Achievement: 06/16/19 Potential to Achieve Goals: Good  Plan Discharge plan remains appropriate;Frequency remains appropriate    Co-evaluation                 AM-PAC OT "6 Clicks" Daily Activity     Outcome Measure   Help from another person eating meals?: None Help from another person taking care of personal grooming?: None Help from another person toileting, which includes using toliet, bedpan, or urinal?: A Little Help from another person bathing (including washing, rinsing, drying)?: A Little Help from another person to put on and taking off regular upper body clothing?: None Help from another person to put on and taking off regular lower body clothing?: A Little 6 Click Score: 21    End of Session Equipment Utilized During Treatment: Gait belt;Rolling walker  OT Visit Diagnosis: Other abnormalities of gait and mobility (R26.89);Muscle weakness (generalized) (M62.81);Pain Pain - Right/Left: Right Pain - part of body: Leg   Activity Tolerance Patient tolerated treatment well   Patient Left in bed;with call bell/phone within reach;with bed alarm set   Nurse Communication          Time: [id number] OT Time Calculation (min): 14 min  Charges: OT General Charges $OT Visit: 1 Visit OT Treatments $Self Care/Home Management : 8-22 mins   Jeni Salles, MPH, MS, OTR/L ascom [phone] 06/05/19, 3:50 PM

## 2019-06-05 NOTE — Progress Notes (Signed)
Sara [name]   Subjective: 06/04/19; Closure of right lower extremity fasciotomies  05/30/19: 4 compartment fasciotomy right lower extremity  05/30/19: 1. Introduction catheter into right lower extremity 3rd order catheter placement  2. Contrast injection right lower extremity for distal runoff  3. Percutaneous transluminal angioplasty and stent placement at 2 locations in the femoral below-knee popliteal bypass 4. Percutaneous transluminal angioplasty and stent placement tibioperoneal trunk  5. Mechanical thrombectomy using the penumbra CAT 6 right femoral popliteal bypass and tibioperoneal trunk and proximal peroneal.             6.  Infusion 12 mg of TPA right femoral-popliteal bypass.             7.  Star close closure left common femoral arteriotomy  Patient without complaint this AM. No issues overnight. Asking to go home.   Objective: Vitals:   06/04/19 1237 06/04/19 2000 06/04/19 2054 06/05/19 0448  BP: (!) 102/56 114/68  92/64  Pulse: 80 (!) 107 (!) 107 91  Resp: 19 20    Temp: 98 F (36.7 C) (!) 102.3 F (39.1 C) 100 F (37.8 C) 99 F (37.2 C)  TempSrc:  Oral Oral Oral  SpO2: 97% 96% 96% 94%  Weight:      Height:        Intake/Output Summary (Last 24 hours) at 06/05/2019 1037 Last data filed at 06/05/2019 0957 Gross per 24 hour  Intake 1303.76 ml  Output 5 ml  Net 1298.76 ml   Physical Exam: A&Ox3, NAD CV: RRR Pulmonary: CTA Bilaterally Abdomen: Soft, Nontender, Nondistended Vascular:  Right Lower Extremity: Thigh soft, bypass palpable, calf soft - dressing with minimal drainage. Toes warm with good capillary refills. Motor sensory intact.   Laboratory: CBC    Component Value Date/Time   WBC 7.4 06/04/2019 0437   HGB 10.1 (L) 06/04/2019 0437   HGB 14.4 09/21/2015 0916   HCT 31.9 (L) 06/04/2019 0437   HCT 44.4 09/21/2015 0916   PLT 180  06/04/2019 0437   PLT 222 09/21/2015 0916   BMET    Component Value Date/Time   NA 141 06/04/2019 0437   NA 139 09/21/2015 0916   K 4.3 06/04/2019 0437   CL 108 06/04/2019 0437   CO2 26 06/04/2019 0437   GLUCOSE 102 (H) 06/04/2019 0437   BUN 10 06/04/2019 0437   BUN 12 09/21/2015 0916   CREATININE 0.79 06/04/2019 0437   CREATININE 0.88 05/16/2016 1034   CALCIUM 8.3 (L) 06/04/2019 0437   GFRNONAA >60 06/04/2019 0437   GFRNONAA 66 05/01/2016 0846   GFRAA >60 06/04/2019 0437   GFRAA 76 05/01/2016 0846   Assessment/Planning: The patient is a 68 year old female with a known history of peripheral artery disease who presented with ischemia to the right lower extremity s/p angiogram s/p take back to the OR for a 4 compartment fasciotomy due to compartment syndrome, status post fasciotomy closure POD#1 1) doing well - asking to go home. Reconsulted PT / OT for recommendations if safe to go home with home PT 2) will need home nursing services for wound care 3) Stopped oxycontin - transitioned to oxycodone and toradol for pain control 4) Hbg stable 5) Will change dressing tomorrow before discharge  Discussed with Dr. Eber Hong Sara Demattia PA-C 06/05/2019 10:37 AM

## 2019-06-05 NOTE — Care Management Important Message (Signed)
Important Message  Patient Details  Name: Sara [name] MRN: [id number] Date of Birth: 01-13-1951   Medicare Important Message Given:  Yes     Dannette Barbara 06/05/2019, 1:10 PM

## 2019-06-06 LAB — BASIC METABOLIC PANEL
Anion gap: 5 (ref 5–15)
BUN: 10 mg/dL (ref 8–23)
CO2: 27 mmol/L (ref 22–32)
Calcium: 7.9 mg/dL — ABNORMAL LOW (ref 8.9–10.3)
Chloride: 110 mmol/L (ref 98–111)
Creatinine, Ser: 0.59 mg/dL (ref 0.44–1.00)
GFR calc Af Amer: >60 mL/min (ref >60)
GFR calc non Af Amer: >60 mL/min (ref >60)
Glucose, Bld: 108 mg/dL — ABNORMAL HIGH (ref 70–99)
Potassium: 3.9 mmol/L (ref 3.5–5.1)
Sodium: 142 mmol/L (ref 135–145)

## 2019-06-06 LAB — CBC
HCT: 28.4 % — ABNORMAL LOW (ref 36.0–46.0)
Hemoglobin: 8.9 g/dL — ABNORMAL LOW (ref 12.0–15.0)
MCH: 28 pg (ref 26.0–34.0)
MCHC: 31.3 g/dL (ref 30.0–36.0)
MCV: 89.3 fL (ref 80.0–100.0)
Platelets: 217 10*3/uL (ref 150–400)
RBC: 3.18 MIL/uL — ABNORMAL LOW (ref 3.87–5.11)
RDW: 16.3 % — ABNORMAL HIGH (ref 11.5–15.5)
WBC: 7.8 10*3/uL (ref 4.0–10.5)
nRBC: 0 % (ref 0.0–0.2)

## 2019-06-06 LAB — MAGNESIUM: Magnesium: 2.3 mg/dL (ref 1.7–2.4)

## 2019-06-06 MED ORDER — POLYETHYLENE GLYCOL 3350 17 G PO PACK: 17.0000 g | PACK | Freq: Every day | ORAL | 0 refills | Status: DC

## 2019-06-06 MED ORDER — OXYCODONE HCL 5 MG PO TABS: ORAL_TABLET | ORAL | 0 refills | Status: DC

## 2019-06-06 MED ORDER — APIXABAN 5 MG PO TABS: 5.0000 mg | ORAL_TABLET | Freq: Two times a day (BID) | ORAL | Status: DC

## 2019-06-06 MED ORDER — DOCUSATE SODIUM 100 MG PO CAPS: 100.0000 mg | ORAL_CAPSULE | Freq: Two times a day (BID) | ORAL | 0 refills | Status: DC

## 2019-06-06 MED ORDER — SENNOSIDES-DOCUSATE SODIUM 8.6-50 MG PO TABS: 2.0000 | ORAL_TABLET | Freq: Every evening | ORAL | Status: DC | PRN

## 2019-06-06 MED ORDER — ACETAMINOPHEN 325 MG PO TABS: 650.0000 mg | ORAL_TABLET | Freq: Four times a day (QID) | ORAL | Status: DC | PRN

## 2019-06-06 NOTE — TOC Progression Note (Signed)
Transition of Care [facility]) - Progression Note    Patient Details  Name: Sara [name] MRN: [id number] Date of Birth: 10-02-1950  Transition of Care [facility]) CM/SW [place], [place] Phone Number: 06/06/2019, 10:49 AM  Clinical Narrative:   CSW alerted by PT that patient has changed her mind about SNF. CSW spoke with patient and she states that she has changed her mind about going home. She would like to go to SNF since her insurance covers it and she is unable to walk on her own. CSW began bed search in [facility] and provided [facility] list for patient. CSW will give bed offers once available. CSW will continue to follow for discharge planning.     Expected Discharge Plan: Home/Self Care Barriers to Discharge: Continued Medical Work up  Expected Discharge Plan and Services Expected Discharge Plan: Home/Self Care                                               Social Determinants of Health (SDOH) Interventions    Readmission Risk Interventions No flowsheet data found.

## 2019-06-06 NOTE — Discharge Instructions (Signed)
Vascular Surgery Discharge Instructions 1) Daily Dressing Changes: Right Lower Extremity: Xeroform to incisions (both medial and lateral), cover with ABD, cover with Kerlix, cover with ACE wrap or Coban.  2) Please encourage elevation of right lower extremity   3) Patient may shower. Gently clean incision with soap and pat dry.

## 2019-06-06 NOTE — Anesthesia Postprocedure Evaluation (Signed)
Anesthesia Post Note  Patient: Sara [name]  Procedure(s) Performed: FASCIOTOMY CLOSURE (Right Leg Lower)  Patient location during evaluation: PACU Anesthesia Type: General Level of consciousness: awake and alert and oriented Pain management: pain level controlled Vital Signs Assessment: post-procedure vital signs reviewed and stable Respiratory status: spontaneous breathing Cardiovascular status: blood pressure returned to baseline Anesthetic complications: no     Last Vitals:  Vitals:   06/05/19 1215 06/05/19 2030  BP: (!) 92/59 (!) 97/56  Pulse: 93 97  Resp: 19 (!) 22  Temp: 37.1 C 37.4 C  SpO2: 93% 94%    Last Pain:  Vitals:   06/05/19 2030  TempSrc: Oral  PainSc:                  Landynn Dupler

## 2019-06-06 NOTE — NC FL2 (Signed)
[place] LEVEL OF CARE SCREENING TOOL     IDENTIFICATION  Patient Name: Sara [name] Birthdate: 28-Mar-1951 Sex: female Admission Date (Current Location): 05/30/2019  [place] and [place] Number:  Engineering geologist and Address:  [facility], [address] [place], [place] [address]      Provider Number: [id number]  Attending Physician Name and Address:  [name], MD  Relative Name and Phone Number:       Current Level of Care: Hospital Recommended Level of Care: [place] Prior Approval Number:    Date Approved/Denied:   PASRR Number: [id number]  Discharge Plan: SNF    Current Diagnoses: Patient Active Problem List   Diagnosis Date Noted  . Ischemic leg 05/31/2019  . Atherosclerosis of artery of extremity with rest pain ([place]) 05/30/2019  . Depression 05/29/2019  . Eczema of lower extremity 03/04/2018  . Chronic venous insufficiency 03/02/2018  . Personal history of colonic polyps   . Family history of colonic polyps   . Benign neoplasm of ascending colon   . Dyspnea on exertion 02/07/2017  . Non-rheumatic mitral regurgitation 02/07/2017  . Precordial pain 02/07/2017  . CAD (coronary artery disease) 12/21/2016  . Essential hypertension 12/21/2016  . Compression fracture of lumbar spine, non-traumatic ([place]) 04/26/2016  . Compression fracture of L3 lumbar vertebra 04/20/2016  . PVD (peripheral vascular disease) ([place]) 02/24/2016  . Family history of premature CAD 02/24/2016  . Anticoagulated on warfarin 02/21/2016  . Gastritis   . Other specified diseases of esophagus   . Hiatal hernia   . Gastritis and gastroduodenitis   . Ischemia of lower extremity   . Arterial occlusion ([place])   . Atherosclerosis of aorta ([place])   . History of smoking   . Hyperlipidemia   . Pain in the chest   . 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 Carotid artery stenosis 01/04/2016   . Vertigo, benign paroxysmal 12/28/2015  . Clinical depression 09/06/2015  . History of alcoholism ([place]) 09/06/2015  . Angiopathy, 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[place]) 09/06/2015  . Peripheral vascular disease ([place]) 09/06/2015  . Acute non-recurrent maxillary sinusitis 07/14/2015  . Vaginal pruritus 05/26/2015  . Chronic recurrent major depressive disorder ([place]) 03/09/2015  . 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 Peripheral blood vessel disorder ([place]) 03/09/2015  . Acid reflux 03/09/2015  . GERD (gastroesophageal reflux disease) 12/21/2014  . Carotid artery narrowing 01/28/2014  . Peripheral arterial occlusive disease ([place]) 06/08/2011  . Occlusion and stenosis of unspecified carotid artery 06/08/2011  . PAD (peripheral artery disease) ([place]) 06/08/2011    Orientation RESPIRATION BLADDER Height & Weight     Self, Time, Situation, Place  Normal Continent Weight: 125 lb (56.7 kg) Height:  5\' 3"  (160 cm)  BEHAVIORAL SYMPTOMS/MOOD NEUROLOGICAL BOWEL NUTRITION STATUS  (none) (none) Continent Diet(Heart healthy)  AMBULATORY STATUS COMMUNICATION OF NEEDS Skin   Extensive Assist Verbally Normal                       Personal Care Assistance Level of Assistance  Bathing, Feeding, Dressing Bathing Assistance: Limited assistance Feeding assistance: Independent Dressing Assistance: Limited assistance     Functional Limitations Info  Sight, Hearing, Speech Sight Info: Adequate Hearing Info: Adequate Speech Info: Adequate    SPECIAL CARE FACTORS FREQUENCY  PT (By licensed PT), OT (By licensed OT)     PT Frequency: 5 OT Frequency: 5            Contractures  Contractures Info: Not present    Additional Factors Info  Code Status, Allergies Code Status Info: Full Code Allergies Info: Hydrocodone-acetaminophen, Amoxicillin, Vicodin           Current Medications (06/06/2019):  This is the current hospital active medication list Current Facility-Administered Medications  Medication  Dose Route Frequency Provider Last Rate Last Dose  . acetaminophen (TYLENOL) tablet 650 mg  650 mg Oral Q6H PRN Schnier, Dolores Lory, MD   650 mg at 06/04/19 2005  . acidophilus (RISAQUAD) capsule 2 capsule  2 capsule Oral TID Delana Meyer Dolores Lory, MD   2 capsule at 06/06/19 0902  . alum & mag hydroxide-simeth (MAALOX/MYLANTA) 200-200-20 MG/5ML suspension 15-30 mL  15-30 mL Oral Q2H PRN Schnier, Dolores Lory, MD      . apixaban (ELIQUIS) tablet 5 mg  5 mg Oral BID Rito Ehrlich A, RPH   5 mg at 06/06/19 0901  . calcium-vitamin D (OSCAL WITH D) 500-200 MG-UNIT per tablet 2 tablet  2 tablet Oral Q breakfast Schnier, Dolores Lory, MD   2 tablet at 06/06/19 0902  . Chlorhexidine Gluconate Cloth 2 % PADS 6 each  6 each Topical Daily Schnier, Dolores Lory, MD   6 each at 06/04/19 0539  . clopidogrel (PLAVIX) tablet 75 mg  75 mg Oral Daily Schnier, Dolores Lory, MD   75 mg at 06/06/19 0901  . docusate sodium (COLACE) capsule 100 mg  100 mg Oral BID Stegmayer, Kimberly A, PA-C   100 mg at 06/06/19 0902  . ketorolac (TORADOL) 30 MG/ML injection 30 mg  30 mg Intravenous Q6H Stegmayer, Kimberly A, PA-C   30 mg at 06/06/19 0518  . losartan (COZAAR) tablet 25 mg  25 mg Oral Daily Schnier, Dolores Lory, MD   25 mg at 06/06/19 0901  . magnesium sulfate IVPB 2 g 50 mL  2 g Intravenous Daily PRN Schnier, Dolores Lory, MD      . ondansetron [facility]) injection 4 mg  4 mg Intravenous Q6H PRN Schnier, Dolores Lory, MD   4 mg at 06/04/19 1106  . oxyCODONE (Oxy IR/ROXICODONE) immediate release tablet 10 mg  10 mg Oral Q4H PRN Stegmayer, Kimberly A, PA-C      . oxyCODONE (Oxy IR/ROXICODONE) immediate release tablet 5 mg  5 mg Oral Q4H PRN Schnier, Dolores Lory, MD   5 mg at 06/04/19 1932  . pantoprazole (PROTONIX) EC tablet 40 mg  40 mg Oral Daily Schnier, Dolores Lory, MD   40 mg at 06/06/19 0901  . polyethylene glycol (MIRALAX / GLYCOLAX) packet 17 g  17 g Oral Daily Schnier, Dolores Lory, MD   17 g at 06/06/19 0902  . potassium chloride SA (KLOR-CON) CR  tablet 20-40 mEq  20-40 mEq Oral Daily PRN Schnier, Dolores Lory, MD      . senna-docusate (Senokot-S) tablet 2 tablet  2 tablet Oral QHS PRN Schnier, Dolores Lory, MD   2 tablet at 06/05/19 1709  . sorbitol 70 % solution 30 mL  30 mL Oral Daily PRN Schnier, Dolores Lory, MD      . venlafaxine XR (EFFEXOR-XR) 24 hr capsule 75 mg  75 mg Oral Q breakfast Schnier, Dolores Lory, MD   75 mg at 06/06/19 0902     Discharge Medications: Please see discharge summary for a list of discharge medications.  Relevant Imaging Results:  Relevant Lab Results:   Additional Information SSN: [id number]  Annamaria Boots, [place]

## 2019-06-06 NOTE — Discharge Summary (Signed)
[facility]    Discharge Summary  Patient ID:  Sara [name] MRN: [id number] DOB/AGE: 66-07-1951 66 y.o.  Admit date: 05/30/2019 Discharge date: 06/06/2019 Date of Surgery: 06/04/2019 Surgeon: Surgeon(s): Schnier, Dolores Lory, MD  Admission Diagnosis: RT Leg Angiography   Ischemic Leg   Pt to have Covid test 05-29-19 per Mickel Baas  Discharge Diagnoses:  RT Leg Angiography   Ischemic Leg   Pt to have Covid test 05-29-19 per Mickel Baas  Secondary Diagnoses: Past Medical History:  Diagnosis Date  . Depression   . GERD (gastroesophageal reflux disease)   . History of blood clots   . Hyperlipidemia   . Hypertension   . Mild mitral regurgitation   . Osteoporosis   . Stomach ulcer   . Vascular disease    Sees Dr. Delana Meyer  . Vertigo    Last episode approx Aug 2015  . Wears dentures    full upper   Procedure(s): 06/04/19: Closure of right lower extremity fasciotomies  05/30/19: 4 compartment fasciotomy right lower extremity  05/30/19: 1. Introduction catheter intorightlower extremity 3rd order catheter placement  2.Contrast injectionrightlower extremity for distal runoff  3. Percutaneous transluminal angioplastyand stent placement at 2 locations in the femoral below-knee popliteal bypass 4. Percutaneous transluminal angioplastyand stent placement tibioperoneal trunk  5. Mechanical thrombectomy using the penumbra CAT 6 right femoral popliteal bypass and tibioperoneal trunk and proximal peroneal. 6.Infusion 12 mg of TPA right femoral-popliteal bypass. 7.Star close closureleftcommon femoral arteriotomy  Discharged Condition: Stable  HPI /Hospital Course:  Sara [name] is a 68 year old female with multiple medical issues including peripheral artery disease, GERD, chronic recurrent major depressive disorder, osteoporosis, history of alcoholism, carotid artery  stenosis, history of tobacco abuse, hyperlipidemia, and hypertension who presented with increasing pain of the right lower extremity.  Physical exam was consistent with likely thrombosis of her previously placed femoral below-knee popliteal bypass and recurrent ischemia of the right foot.  This suggested the patient is having limb threatening ischemia. The risks and benefits are reviewed all questions answered patient agrees to proceed right lower extremity angiography for limb salvage.  On May 30, 2019, the patient underwent:   1. Introduction catheter intorightlower extremity 3rd order catheter placement  2.Contrast injectionrightlower extremity for distal runoff  3. Percutaneous transluminal angioplastyand stent placement at 2 locations in the femoral below-knee popliteal bypass 4. Percutaneous transluminal angioplastyand stent placement tibioperoneal trunk  5. Mechanical thrombectomy using the penumbra CAT 6 right femoral popliteal bypass and tibioperoneal trunk and proximal peroneal. 6.Infusion 12 mg of TPA right femoral-popliteal bypass. 7.Star close closureleftcommon femoral arteriotomy  She tolerated the procedure well was transferred to the recovery room.  In the recovery room the patient began to experience progressively worsening and eventually severe right calf pain with numbness to the right foot.  Physical exam was notable for compartment syndrome.  The patient was taken to the OR emergently and underwent a:   1. Four compartment fasciotomy right lower extremity  She tolerated the procedure well and was transferred to the recovery room without issue.  In the recovery room, the patient was found to be hypotensive and was transferred to the ICU and started on pressors to maintain hemodynamic stability.  The patient spent 2 days in the ICU and once pressors run on longer needed was  transferred to the surgical floor.  The patient was taken back to the OR on 06/04/19 and underwent a:    1. Closure of right lower extremity fasciotomies  Again, the patient tolerated the  procedure well was transferred back to the surgical floor without complication.  During the patient's inpatient stay, her diet was advanced, her Foley was removed without issue.  Her pain was controlled to the use of p.o. pain medication.  The patient has been working with occupational and physical therapy who recommended SNF placement upon discharge.  Patient is agreeable to this.  Physical exam:  A&Ox3, NAD CV: RRR Pulmonary: CTA Bilaterally Abdomen: Soft, Non-tender, Non-distended, (+) Bowel Sounds Groin: Left Access Site - clean and dry. Healing well. Right Lower Extremity:  Or dressing removed.  Incisions are clean dry and intact.  Healing well. Thigh is soft.  Calf is soft.  Some swelling noted to the right foot.  Extremities warm distally to the toes.  Good capillary refill to the toes.  Labs as below  Complications: None  Consults:  Hospitalist Team Physical Therapy Occupational Therapy  Significant Diagnostic Studies: CBC Lab Results  Component Value Date   WBC 7.8 06/06/2019   HGB 8.9 (L) 06/06/2019   HCT 28.4 (L) 06/06/2019   MCV 89.3 06/06/2019   PLT 217 06/06/2019   BMET    Component Value Date/Time   NA 142 06/06/2019 0631   NA 139 09/21/2015 0916   K 3.9 06/06/2019 0631   CL 110 06/06/2019 0631   CO2 27 06/06/2019 0631   GLUCOSE 108 (H) 06/06/2019 0631   BUN 10 06/06/2019 0631   BUN 12 09/21/2015 0916   CREATININE 0.59 06/06/2019 0631   CREATININE 0.88 05/16/2016 1034   CALCIUM 7.9 (L) 06/06/2019 0631   GFRNONAA >60 06/06/2019 0631   GFRNONAA 66 05/01/2016 0846   GFRAA >60 06/06/2019 0631   GFRAA 76 05/01/2016 0846   COAG Lab Results  Component Value Date   INR 1.0 06/04/2019   INR 2.2 (H) 06/01/2019   INR 1.8 (H) 05/30/2019   Disposition:  Discharge to  :Skilled nursing facility  Allergies as of 06/06/2019      Reactions   Hydrocodone-acetaminophen    Other reaction(s): Flushing   Amoxicillin Other (See Comments)   Yeast infection   Vicodin [hydrocodone-acetaminophen] Hives, Rash      Medication List    STOP taking these medications   Jantoven 2 MG tablet Generic drug: warfarin   traMADol 50 MG tablet Commonly known as: ULTRAM     TAKE these medications   acetaminophen 325 MG tablet Commonly known as: TYLENOL Take 2 tablets (650 mg total) by mouth every 6 (six) hours as needed for mild pain or fever.   apixaban 5 MG Tabs tablet Commonly known as: ELIQUIS Take 1 tablet (5 mg total) by mouth 2 (two) times daily.   atorvastatin 40 MG tablet Commonly known as: LIPITOR Take 40 mg by mouth at bedtime.   calcium-vitamin D 500-200 MG-UNIT tablet Commonly known as: OSCAL WITH D Take 2 tablets by mouth daily with breakfast.   clopidogrel 75 MG tablet Commonly known as: PLAVIX Take 1 tablet (75 mg total) by mouth daily.   docusate sodium 100 MG capsule Commonly known as: COLACE Take 1 capsule (100 mg total) by mouth 2 (two) times daily.   ibandronate 150 MG tablet Commonly known as: BONIVA Take 150 mg by mouth every 30 (thirty) days.   losartan 25 MG tablet Commonly known as: COZAAR Take 25 mg by mouth daily.   omeprazole 40 MG capsule Commonly known as: PRILOSEC Take 1 capsule (40 mg total) by mouth daily.   oxyCODONE 5 MG immediate release tablet Commonly known as:  Oxy IR/ROXICODONE One to Two Tabs Every Four to Six Hours As Needed For Pain   polyethylene glycol 17 g packet Commonly known as: MIRALAX / GLYCOLAX Take 17 g by mouth daily. Start taking on: June 07, 2019   senna-docusate 8.6-50 MG tablet Commonly known as: Senokot-S Take 2 tablets by mouth at bedtime as needed for mild constipation.   venlafaxine XR 75 MG 24 hr capsule Commonly known as: EFFEXOR-XR TAKE 1 CAPSULE BY MOUTH  DAILY WITH  BREAKFAST      Verbal and written Discharge instructions given to the patient. Wound care per Discharge AVS Follow-up Information    Schnier, Dolores Lory, MD Follow up in 2 week(s).   Specialties: Vascular Surgery, Cardiology, Radiology, Vascular Surgery Why: Can see Arna Medici or Civil engineer, contracting. Wound check. Possible suture removal. Will need ABI with visit.  Contact information: [place] [address] [phone]          Signed: Sela Hua, PA-C  06/06/2019, 11:30 AM

## 2019-06-07 LAB — CBC
HCT: 27.6 % — ABNORMAL LOW (ref 36.0–46.0)
Hemoglobin: 8.6 g/dL — ABNORMAL LOW (ref 12.0–15.0)
MCH: 27.9 pg (ref 26.0–34.0)
MCHC: 31.2 g/dL (ref 30.0–36.0)
MCV: 89.6 fL (ref 80.0–100.0)
Platelets: 252 10*3/uL (ref 150–400)
RBC: 3.08 MIL/uL — ABNORMAL LOW (ref 3.87–5.11)
RDW: 16.4 % — ABNORMAL HIGH (ref 11.5–15.5)
WBC: 7.6 10*3/uL (ref 4.0–10.5)
nRBC: 0 % (ref 0.0–0.2)

## 2019-06-07 LAB — BASIC METABOLIC PANEL
Anion gap: 9 (ref 5–15)
BUN: 10 mg/dL (ref 8–23)
CO2: 25 mmol/L (ref 22–32)
Calcium: 7.9 mg/dL — ABNORMAL LOW (ref 8.9–10.3)
Chloride: 108 mmol/L (ref 98–111)
Creatinine, Ser: 0.53 mg/dL (ref 0.44–1.00)
GFR calc Af Amer: >60 mL/min (ref >60)
GFR calc non Af Amer: >60 mL/min (ref >60)
Glucose, Bld: 105 mg/dL — ABNORMAL HIGH (ref 70–99)
Potassium: 4 mmol/L (ref 3.5–5.1)
Sodium: 142 mmol/L (ref 135–145)

## 2019-06-07 LAB — SARS CORONAVIRUS 2 (TAT 6-24 HRS): SARS Coronavirus 2: NEGATIVE

## 2019-06-07 LAB — MAGNESIUM: Magnesium: 2 mg/dL (ref 1.7–2.4)

## 2019-06-07 MED ORDER — APIXABAN 5 MG PO TABS: 5.0000 mg | ORAL_TABLET | Freq: Two times a day (BID) | ORAL | 2 refills | Status: DC

## 2019-06-07 NOTE — Progress Notes (Signed)
3 Days Post-Op   Subjective/Chief Complaint: Doing well. Minimal pain in RIGHT leg. Well controlled. States she is ready to go to rehab. Otherwise without complaint.   Objective: Vital signs in last 24 hours: Temp:  [98.8 F (37.1 C)-98.9 F (37.2 C)] 98.8 F (37.1 C) (10/16 2042) Pulse Rate:  [92-94] 92 (10/16 2042) Resp:  [24] 24 (10/16 1243) BP: (116-121)/(62-68) 116/62 (10/16 2042) SpO2:  [94 %-98 %] 94 % (10/16 2042) Last BM Date: 06/06/19  Intake/Output from previous day: 10/16 0701 - 10/17 0700 In: 480 [P.O.:480] Out: 1000 [Urine:1000] Intake/Output this shift: No intake/output data recorded.  General appearance: alert and no distress Resp: clear to auscultation bilaterally Cardio: regular rate and rhythm Extremities:  Incisions are clean dry and intact.  Healing well. Thigh/calf soft. Foot warm, mild edema, good capillary refill and perfusion, +dorsi and plantar flexion.  Lab Results:  Recent Labs    06/06/19 0631 06/07/19 0621  WBC 7.8 7.6  HGB 8.9* 8.6*  HCT 28.4* 27.6*  PLT 217 252   BMET Recent Labs    06/06/19 0631 06/07/19 0621  NA 142 142  K 3.9 4.0  CL 110 108  CO2 27 25  GLUCOSE 108* 105*  BUN 10 10  CREATININE 0.59 0.53  CALCIUM 7.9* 7.9*   PT/INR No results for input(s): LABPROT, INR in the last 72 hours. ABG No results for input(s): PHART, HCO3 in the last 72 hours.  Invalid input(s): PCO2, PO2  Studies/Results: No results found.  Anti-infectives: Anti-infectives (From admission, onward)   Start     Dose/Rate Route Frequency Ordered Stop   06/04/19 0948  clindamycin (CLEOCIN) 600 MG/50ML IVPB    Note to Pharmacy: Nyra Jabs   : cabinet override      06/04/19 0948 06/04/19 1344   06/04/19 0700  clindamycin (CLEOCIN) IVPB 600 mg    Note to Pharmacy: To be given in OR   600 mg 100 mL/hr over 30 Minutes Intravenous  Once 06/03/19 2246 06/04/19 1040   05/31/19 0200  clindamycin (CLEOCIN) capsule 300 mg     300 mg Oral  Every 8 hours 05/31/19 0139 05/31/19 1740   05/31/19 0000  clindamycin (CLEOCIN) IVPB 300 mg  Status:  Discontinued    Note to Pharmacy: To be given in OR   300 mg 100 mL/hr over 30 Minutes Intravenous  Once 05/30/19 1716 05/31/19 0134   05/30/19 0940  ceFAZolin (ANCEF) 2-4 GM/100ML-% IVPB    Note to Pharmacy: Despina Arias  : cabinet override      05/30/19 0940 05/30/19 1122   05/29/19 2230  ceFAZolin (ANCEF) IVPB 2g/100 mL premix  Status:  Discontinued     2 g 200 mL/hr over 30 Minutes Intravenous  Once 05/29/19 2218 05/30/19 1413      Assessment/Plan: s/p Procedure(s): FASCIOTOMY CLOSURE (Right) Plan for discharge to Rehab today when bed available.  LOS: 7 days    Jamesetta So A 06/07/2019

## 2019-06-07 NOTE — TOC Transition Note (Addendum)
Transition of Care [facility]) - CM/SW Discharge Note   Patient Details  Name: Sara [name] MRN: [id number] Date of Birth: 10/17/1950  [facility] of [facility] CM/SW Contact:  Ross Ludwig, LCSW Phone Number: 06/07/2019, 11:28 AM   Clinical Narrative:     CSW presented bed offers and she stated she does not want to go to SNF now.  CSW explained the difference between getting home health verse going to a SNF.  CSW explained going home with home health will only see her a couple of times a week.  Patient stated she has a small apartment, and she does not need to move around very much.  Patient was explained the benefit of going to SNF, and patient stated she does not want to go to the facility that offered her a bed.  CSW explained this is the only choice she has.  CSW asked what kind of support she has at home, she states her sister and brother will stay with her for a few days after discharge.  Patient was admant about not going to SNF choice.  CSW asked if patient needed any equipment she said her brother has a walker she can use.  Patient stated her sister will pick her up from the hospital.  Patient was given an Eliquis coupon for a free 30 day supply, patient was appreciative of coupon given.   Final next level of care: [place] Barriers to Discharge: Barriers Resolved   Patient Goals and CMS Choice Patient states their goals for this hospitalization and ongoing recovery are:: To return back home with home health CMS Medicare.gov Compare Post Acute Care list provided to:: Patient Choice offered to / list presented to : Patient  Discharge Placement  Patient will be discharging back home with home health PT, OT, aide, and nurse for wound care.                  Discharge Plan and Services                DME Arranged: N/A DME Agency: NA     Representative spoke with at DME Agency: na HH Arranged: RN, PT, OT, Nurse's Aide [place] Agency: [place]  ([facility]) Date HH Agency Contacted: 06/07/19 Time [place]: [phone] Representative spoke with at [place]: [place] ([place]) Interventions     Readmission Risk Interventions No flowsheet data found.

## 2019-06-07 NOTE — Progress Notes (Signed)
Discharge instructions reviewed with patient including followup visits and new medications/medication changes.  Understanding was verbalized and all questions were answered.  IV removed without complication; patient tolerated well.  Patient participated in daily dressing change as teaching with teachback provided prior to discharge.  Dressing change supplies provided in the form of xeroform, ABD, gauze and ACE bandages.  Patient discharged home via wheelchair in stable condition escorted by volunteer staff.

## 2019-06-11 ENCOUNTER — Telehealth (INDEPENDENT_AMBULATORY_CARE_PROVIDER_SITE_OTHER): Payer: Self-pay | Admitting: Nurse Practitioner

## 2019-06-11 NOTE — Telephone Encounter (Signed)
Sara [name] with [facility] [phone]) called requesting verbal order for wound care 2 x week for 1 week then 1 x week for 8 weeks.  Also asking if patient has any weight bearing restrictions. States that patient has been walking on leg and when doing so has increased drainage.  Please advise. AS, CMA

## 2019-06-11 NOTE — Telephone Encounter (Signed)
Christ PT with [facility] [phone]) calling requesting PT order for 2 x week for 2 weeks then 1 x week for 3 weeks. Also wanted to know if range of motion for ankle was okay. Spoke with Arna Medici who verbalized this was fine. Gerald Stabs is aware and verbalized understanding. AS, CMA

## 2019-06-11 NOTE — Telephone Encounter (Signed)
Spoke with Amy and advised her of the below. She verbalized understanding. AS< CMA

## 2019-06-11 NOTE — Telephone Encounter (Signed)
Left message for Amy to return my call. AS, CMA

## 2019-06-11 NOTE — Telephone Encounter (Signed)
The order for wound dressings is fine.  The patient can continue to weight-bear on her leg.  As long as the drainage is serosanguineous or serous that is not an issue.  Not bearing weight on the leg could actually be more detrimental than weightbearing activities.

## 2019-06-23 ENCOUNTER — Encounter (INDEPENDENT_AMBULATORY_CARE_PROVIDER_SITE_OTHER): Payer: Self-pay | Admitting: Nurse Practitioner

## 2019-06-23 ENCOUNTER — Ambulatory Visit (INDEPENDENT_AMBULATORY_CARE_PROVIDER_SITE_OTHER): Payer: Medicare Other | Admitting: Nurse Practitioner

## 2019-06-23 ENCOUNTER — Other Ambulatory Visit: Payer: Self-pay

## 2019-06-23 VITALS — BP 109/72 | HR 111 | Resp 18 | Ht 63.0 in | Wt 127.0 lb

## 2019-06-23 DIAGNOSIS — E7849 Other hyperlipidemia: Secondary | ICD-10-CM

## 2019-06-23 DIAGNOSIS — K219 Gastro-esophageal reflux disease without esophagitis: Secondary | ICD-10-CM

## 2019-06-23 DIAGNOSIS — I70229 Atherosclerosis of native arteries of extremities with rest pain, unspecified extremity: Secondary | ICD-10-CM

## 2019-06-23 DIAGNOSIS — T8149XA Infection following a procedure, other surgical site, initial encounter: Secondary | ICD-10-CM

## 2019-06-23 MED ORDER — DOXYCYCLINE HYCLATE 100 MG PO CAPS: 100.0000 mg | ORAL_CAPSULE | Freq: Two times a day (BID) | ORAL | 0 refills | Status: DC

## 2019-06-23 NOTE — Progress Notes (Signed)
SUBJECTIVE:  Patient ID: Sara [name], female    DOB: 1950/12/11, 68 y.o.   MRN: [id number] Chief Complaint  Patient presents with  . Follow-up    wound check    HPI  Sara [name] is a 68 y.o. female that presents today for lower extremity wound check as well as suture removal.  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  Shortly thereafter revascularization the patient was found to have compartment syndrome and underwent a fasciotomy on the same day.  The fasciotomy was subsequently closed on 06/04/2019.  Today the patient reports that she is feeling much better.  However at the medial incision portion some of the tissue has begun to pack to the sutures.  She also states that she has times where this area becomes red and tender.  She does endorse having some drainage from that area.  The patient also has concerns to with her Eliquis.  Prior to the this event the patient was on Coumadin however her INR changed from subtherapeutic to supratherapeutic.  The patient was given a supply following leaving the hospital however she will run out shortly and is unable to afford continued prescriptions.  She denies any fever, chills, nausea, vomiting or diarrhea.  She denies any chest pain or shortness of breath.  She denies any TIA-like symptoms.  Past Medical History:  Diagnosis Date  . Depression   . GERD (gastroesophageal reflux disease)   . History of blood clots   . Hyperlipidemia   . Hypertension   . Mild mitral regurgitation   . Osteoporosis   . Stomach ulcer   . Vascular disease    Sees Dr. Delana Meyer  . Vertigo    Last episode approx Aug 2015  . Wears dentures    full upper    Past Surgical History:  Procedure Laterality Date  . ADENOIDECTOMY    . BREAST CYST ASPIRATION Left   . CARDIAC CATHETERIZATION  02/15/2017   [facility]  . COLONOSCOPY WITH PROPOFOL N/A 10/22/2017   Procedure:  COLONOSCOPY WITH PROPOFOL;  Surgeon: Lucilla Lame, MD;  Location: [place];  Service: Endoscopy;  Laterality: N/A;  specimens not taken--pt on Plavix will be brought back in after 7 days off med  . COLONOSCOPY WITH PROPOFOL N/A 11/05/2017   Procedure: COLONOSCOPY WITH PROPOFOL;  Surgeon: Lucilla Lame, MD;  Location: [place];  Service: Endoscopy;  Laterality: N/A;  . ESOPHAGOGASTRODUODENOSCOPY N/A 12/21/2014   Procedure: ESOPHAGOGASTRODUODENOSCOPY (EGD);  Surgeon: Lucilla Lame, MD;  Location: [place];  Service: Gastroenterology;  Laterality: N/A;  . ESOPHAGOGASTRODUODENOSCOPY (EGD) WITH PROPOFOL N/A 02/08/2016   Procedure: ESOPHAGOGASTRODUODENOSCOPY (EGD) WITH PROPOFOL;  Surgeon: Lucilla Lame, MD;  Location: [facility];  Service: Endoscopy;  Laterality: N/A;  . FASCIOTOMY Right 05/30/2019   Procedure: FASCIOTOMY;  Surgeon: [name], MD;  Location: [facility];  Service: Vascular;  Laterality: Right;  . FASCIOTOMY CLOSURE Right 06/04/2019   Procedure: FASCIOTOMY CLOSURE;  Surgeon: [name], MD;  Location: [facility];  Service: Vascular;  Laterality: Right;  . HEMORROIDECTOMY  2014  . LOWER EXTREMITY ANGIOGRAPHY Right 05/30/2019   Procedure: LOWER EXTREMITY ANGIOGRAPHY;  Surgeon: [name], MD;  Location: [place] [facility];  Service: Cardiovascular;  Laterality: Right;  . PERIPHERAL VASCULAR CATHETERIZATION N/A 02/09/2016   Procedure: Abdominal Aortogram w/Lower Extremity;  Surgeon: [name], MD;  Location: [place] [facility];  Service: Cardiovascular;  Laterality: N/A;  .  PERIPHERAL VASCULAR CATHETERIZATION Right 02/10/2016   Procedure: Lower Extremity Angiography;  Surgeon: [name], MD;  Location: [place] [facility];  Service: Cardiovascular;  Laterality: Right;  . POLYPECTOMY  11/05/2017   Procedure: POLYPECTOMY;  Surgeon: Lucilla Lame, MD;  Location: [place];  Service: Endoscopy;;  . TONSILLECTOMY    .  VASCULAR SURGERY  [id number]   Fem-Pop Bypass    Social History   Socioeconomic History  . Marital status: Single    Spouse name: Not on file  . Number of children: Not on file  . Years of education: Not on file  . Highest education level: Not on file  Occupational History  . Not on file  Social Needs  . Financial resource strain: Not on file  . Food insecurity    Worry: Not on file    Inability: Not on file  . Transportation needs    Medical: Not on file    Non-medical: Not on file  Tobacco Use  . Smoking status: Former Smoker    Packs/day: 2.00    Years: 25.00    Pack years: 50.00    Types: Cigarettes    Quit date: 08/22/1991    Years since quitting: 27.8  . Smokeless tobacco: Never Used  Substance and Sexual Activity  . Alcohol use: No    Alcohol/week: 0.0 standard drinks  . Drug use: Yes    Comment: tramadol X4 50mg  daily  . Sexual activity: Not on file  Lifestyle  . Physical activity    Days per week: Not on file    Minutes per session: Not on file  . Stress: Not on file  Relationships  . Social Herbalist on phone: Not on file    Gets together: Not on file    Attends religious service: Not on file    Active member of club or organization: Not on file    Attends meetings of clubs or organizations: Not on file    Relationship status: Not on file  . Intimate partner violence    Fear of current or ex partner: Not on file    Emotionally abused: Not on file    Physically abused: Not on file    Forced sexual activity: Not on file  Other Topics Concern  . Not on file  Social History Narrative  . Not on file    Family History  Problem Relation Age of Onset  . CVA Mother   . Heart attack Mother   . Cancer Father        colon cancer  . Colon cancer Father   . Heart disease Maternal Uncle   . Breast cancer Neg Hx     Allergies  Allergen Reactions  . Hydrocodone-Acetaminophen     Other reaction(s): Flushing  . Amoxicillin Other (See  Comments)    Yeast infection  . Vicodin [Hydrocodone-Acetaminophen] Hives and Rash     Review of Systems   Review of Systems: Negative Unless Checked Constitutional: [] Weight loss  [] Fever  [] Chills Cardiac: [] Chest pain   []  Atrial Fibrillation  [] Palpitations   [] Shortness of breath when laying flat   [] Shortness of breath with exertion. [] Shortness of breath at rest Vascular:  [] Pain in legs with walking   [] Pain in legs with standing [] Pain in legs when laying flat   [] Claudication    [] Pain in feet when laying flat    [] History of DVT   [] Phlebitis   [] Swelling in legs   [] Varicose  veins   [] Non-healing ulcers Pulmonary:   [] Uses home oxygen   [] Productive cough   [] Hemoptysis   [] Wheeze  [] COPD   [] Asthma Neurologic:  [] Dizziness   [] Seizures  [] Blackouts [] History of stroke   [] History of TIA  [] Aphasia   [] Temporary Blindness   [] Weakness or numbness in arm   [] Weakness or numbness in leg Musculoskeletal:   [] Joint swelling   [] Joint pain   [] Low back pain  []  History of Knee Replacement [] Arthritis [] back Surgeries  []  Spinal Stenosis    Hematologic:  [] Easy bruising  [] Easy bleeding   [] Hypercoagulable state   [] Anemic Gastrointestinal:  [] Diarrhea   [] Vomiting  [] Gastroesophageal reflux/heartburn   [] Difficulty swallowing. [] Abdominal pain Genitourinary:  [] Chronic kidney disease   [] Difficult urination  [] Anuric   [] Blood in urine [] Frequent urination  [] Burning with urination   [] Hematuria Skin:  [] Rashes   [] Ulcers [] Wounds Psychological:  [] History of anxiety   []  History of major depression  []  Memory Difficulties      OBJECTIVE:   Physical Exam  BP 109/72 (BP Location: Right Arm)   Pulse (!) 111   Resp 18   Ht 5\' 3"  (1.6 m)   Wt 127 lb (57.6 kg)   BMI 22.50 kg/m   Gen: WD/WN, NAD Head: [place]/AT, No temporalis wasting.  Ear/Nose/Throat: Hearing grossly intact, nares w/o erythema or drainage Eyes: PER, EOMI, sclera nonicteric.  Neck: Supple, no masses.  No JVD.   Pulmonary:  Good air movement, no use of accessory muscles.  Cardiac: RRR Vascular:  Lateral incision looks fairly well approximated with no areas of oozing.  Medial incision has tissue coming between the suture lines at the distal portion.  Redness near this area as well as around the sutures.  More painful than the lateral wound.  Minimal edema bilaterally Vessel Right Left  Radial Palpable Palpable  Dorsalis Pedis Not Palpable Not Palpable  Posterior Tibial Not Palpable Not Palpable   Gastrointestinal: soft, non-distended. No guarding/no peritoneal signs.  Musculoskeletal: M/S 5/5 throughout.  No deformity or atrophy.  Neurologic: Pain and light touch intact in extremities.  Symmetrical.  Speech is fluent. Motor exam as listed above. Psychiatric: Judgment intact, Mood & affect appropriate for pt's clinical situation. Dermatologic: No Venous rashes. No Ulcers Noted.  No changes consistent with cellulitis. Lymph : No Cervical lymphadenopathy, no lichenification or skin changes of chronic lymphedema.       ASSESSMENT AND PLAN:  1. Atherosclerosis of artery of extremity with rest pain ([place]) Patient's lower extremity is warm to indicate adequate perfusion.  Her lower extremity wounds are beginning to heal well except for the distal portion of the incision.  We will continue to have the patient dressed it with Xeroform and ABD pads covering with Kerlix and Ace bandages.  She has an upcoming appointment in approximately 2 weeks and we will reassess the wound at that time as well as assess for further staple removal.  Patient instructed to call if the wound worsens or if pain increases greatly.  2. Gastroesophageal reflux disease without esophagitis Continue PPI as already ordered, this medication has been reviewed and there are no changes at this time.  Avoidence of caffeine and alcohol  Moderate elevation of the head of the bed   3. Other hyperlipidemia Continue statin as ordered and  reviewed, no changes at this time   4. Postoperative wound infection We will get the patient doxycycline to treat his suspected postoperative wound infection.  We have a low tolerance for  this due to the fact that infection within this area could compromise previous grafts at cause much bigger issues.  Patient has an upcoming appointment on 07/10/2019 and we will reevaluate. - doxycycline (VIBRAMYCIN) 100 MG capsule; Take 1 capsule (100 mg total) by mouth 2 (two) times daily.  Dispense: 14 capsule; Refill: 0    Current Outpatient Medications on File Prior to Visit  Medication Sig Dispense Refill  . apixaban (ELIQUIS) 5 MG TABS tablet Take 1 tablet (5 mg total) by mouth 2 (two) times daily. 60 tablet   . apixaban (ELIQUIS) 5 MG TABS tablet Take 1 tablet (5 mg total) by mouth 2 (two) times daily. 60 tablet 2  . atorvastatin (LIPITOR) 40 MG tablet Take 40 mg by mouth at bedtime.     . calcium-vitamin D (OSCAL WITH D) 500-200 MG-UNIT tablet Take 2 tablets by mouth daily with breakfast.    . clopidogrel (PLAVIX) 75 MG tablet Take 1 tablet (75 mg total) by mouth daily. 90 tablet 3  . ibandronate (BONIVA) 150 MG tablet Take 150 mg by mouth every 30 (thirty) days.     Marland Kitchen losartan (COZAAR) 25 MG tablet Take 25 mg by mouth daily.     Marland Kitchen omeprazole (PRILOSEC) 40 MG capsule Take 1 capsule (40 mg total) by mouth daily. 90 capsule 3  . traMADol (ULTRAM) 50 MG tablet tramadol 50 mg tablet  1 PO every 6 hours for pain FILL 06/12/19    . venlafaxine XR (EFFEXOR-XR) 75 MG 24 hr capsule TAKE 1 CAPSULE BY MOUTH  DAILY WITH BREAKFAST 90 capsule 3  . acetaminophen (TYLENOL) 325 MG tablet Take 2 tablets (650 mg total) by mouth every 6 (six) hours as needed for mild pain or fever. (Patient not taking: Reported on 06/23/2019)    . docusate sodium (COLACE) 100 MG capsule Take 1 capsule (100 mg total) by mouth 2 (two) times daily. 10 capsule 0  . oxyCODONE (OXY IR/ROXICODONE) 5 MG immediate release tablet One to Two Tabs  Every Four to Six Hours As Needed For Pain (Patient not taking: Reported on 06/23/2019) 50 tablet 0  . polyethylene glycol (MIRALAX / GLYCOLAX) 17 g packet Take 17 g by mouth daily. 14 each 0  . senna-docusate (SENOKOT-S) 8.6-50 MG tablet Take 2 tablets by mouth at bedtime as needed for mild constipation.     No current facility-administered medications on file prior to visit.     There are no Patient Instructions on file for this visit. No follow-ups on file.   Kris Hartmann, NP  This note was completed with Sales executive.  Any errors are purely unintentional.

## 2019-06-24 ENCOUNTER — Other Ambulatory Visit (INDEPENDENT_AMBULATORY_CARE_PROVIDER_SITE_OTHER): Payer: Self-pay | Admitting: Nurse Practitioner

## 2019-06-24 DIAGNOSIS — Z7901 Long term (current) use of anticoagulants: Secondary | ICD-10-CM

## 2019-07-03 ENCOUNTER — Encounter: Payer: Self-pay | Admitting: Emergency Medicine

## 2019-07-03 ENCOUNTER — Telehealth (INDEPENDENT_AMBULATORY_CARE_PROVIDER_SITE_OTHER): Payer: Self-pay

## 2019-07-03 ENCOUNTER — Other Ambulatory Visit: Payer: Self-pay

## 2019-07-03 ENCOUNTER — Emergency Department
Admission: EM | Admit: 2019-07-03 | Discharge: 2019-07-03 | Disposition: A | Discharge status: Home / Self Care | Payer: Medicare Other | Attending: Emergency Medicine | Admitting: Emergency Medicine

## 2019-07-03 DIAGNOSIS — Z79899 Other long term (current) drug therapy: Secondary | ICD-10-CM | POA: Insufficient documentation

## 2019-07-03 DIAGNOSIS — M316 Other giant cell arteritis: Secondary | ICD-10-CM | POA: Insufficient documentation

## 2019-07-03 DIAGNOSIS — Z87891 Personal history of nicotine dependence: Secondary | ICD-10-CM | POA: Insufficient documentation

## 2019-07-03 DIAGNOSIS — Z7901 Long term (current) use of anticoagulants: Secondary | ICD-10-CM | POA: Insufficient documentation

## 2019-07-03 DIAGNOSIS — I10 Essential (primary) hypertension: Secondary | ICD-10-CM | POA: Insufficient documentation

## 2019-07-03 DIAGNOSIS — R519 Headache, unspecified: Secondary | ICD-10-CM | POA: Diagnosis present

## 2019-07-03 DIAGNOSIS — I2510 Atherosclerotic heart disease of native coronary artery without angina pectoris: Secondary | ICD-10-CM | POA: Diagnosis not present

## 2019-07-03 LAB — SEDIMENTATION RATE: Sed Rate: 58 mm/hr — ABNORMAL HIGH (ref 0–30)

## 2019-07-03 LAB — CBC WITH DIFFERENTIAL/PLATELET
Abs Immature Granulocytes: 0.03 10*3/uL (ref 0.00–0.07)
Basophils Absolute: 0.1 10*3/uL (ref 0.0–0.1)
Basophils Relative: 1 %
Eosinophils Absolute: 0.3 10*3/uL (ref 0.0–0.5)
Eosinophils Relative: 3 %
HCT: 36.7 % (ref 36.0–46.0)
Hemoglobin: 11.9 g/dL — ABNORMAL LOW (ref 12.0–15.0)
Immature Granulocytes: 0 %
Lymphocytes Relative: 15 %
Lymphs Abs: 1.1 10*3/uL (ref 0.7–4.0)
MCH: 28.6 pg (ref 26.0–34.0)
MCHC: 32.4 g/dL (ref 30.0–36.0)
MCV: 88.2 fL (ref 80.0–100.0)
Monocytes Absolute: 0.9 10*3/uL (ref 0.1–1.0)
Monocytes Relative: 12 %
Neutro Abs: 5 10*3/uL (ref 1.7–7.7)
Neutrophils Relative %: 69 %
Platelets: 263 10*3/uL (ref 150–400)
RBC: 4.16 MIL/uL (ref 3.87–5.11)
RDW: 16.3 % — ABNORMAL HIGH (ref 11.5–15.5)
WBC: 7.3 10*3/uL (ref 4.0–10.5)
nRBC: 0 % (ref 0.0–0.2)

## 2019-07-03 LAB — BASIC METABOLIC PANEL
Anion gap: 10 (ref 5–15)
BUN: 11 mg/dL (ref 8–23)
CO2: 25 mmol/L (ref 22–32)
Calcium: 9 mg/dL (ref 8.9–10.3)
Chloride: 102 mmol/L (ref 98–111)
Creatinine, Ser: 0.69 mg/dL (ref 0.44–1.00)
GFR calc Af Amer: >60 mL/min (ref >60)
GFR calc non Af Amer: >60 mL/min (ref >60)
Glucose, Bld: 96 mg/dL (ref 70–99)
Potassium: 3.9 mmol/L (ref 3.5–5.1)
Sodium: 137 mmol/L (ref 135–145)

## 2019-07-03 MED ORDER — PREDNISONE 10 MG PO TABS: 10.0000 mg | ORAL_TABLET | Freq: Every day | ORAL | 0 refills | Status: DC

## 2019-07-03 NOTE — ED Provider Notes (Signed)
[facility] Emergency Department Provider Note   ____________________________________________   First MD Initiated Contact with Patient 07/03/19 1429     (approximate)  I have reviewed the triage vital signs and the nursing notes.   HISTORY  Chief Complaint Headache    HPI Sara [name] is a 68 y.o. female patient complains of 2 weeks of increasing bitemporal headache and pain in her jaw when chewing.  It started after she left the hospital for treatment of her right leg arterial blockage with the complication of compartment syndrome.  Patient not denies any fever chills lightheadedness or other problems.  She is supposed to follow-up with vascular surgery on the 19th to get the rest of her stitches out.  She reports her temples are both tender to palpation and they feel puffy and swollen.         Past Medical History:  Diagnosis Date   Depression    GERD (gastroesophageal reflux disease)    History of blood clots    Hyperlipidemia    Hypertension    Mild mitral regurgitation    Osteoporosis    Stomach ulcer    Vascular disease    Sees Dr. Delana Meyer   Vertigo    Last episode approx Aug 2015   Wears dentures    full upper    Patient Active Problem List   Diagnosis Date Noted   Postoperative wound infection 06/23/2019   Ischemic leg 05/31/2019   Atherosclerosis of artery of extremity with rest pain ([place]) 05/30/2019   Depression 05/29/2019   Eczema of lower extremity 03/04/2018   Chronic venous insufficiency 03/02/2018   Personal history of colonic polyps    Family history of colonic polyps    Benign neoplasm of ascending colon    Dyspnea on exertion 02/07/2017   Non-rheumatic mitral regurgitation 02/07/2017   Precordial pain 02/07/2017   CAD (coronary artery disease) 12/21/2016   Essential hypertension 12/21/2016   Compression fracture of lumbar spine, non-traumatic ([place]) 04/26/2016   Compression fracture of  L3 lumbar vertebra 04/20/2016   PVD (peripheral vascular disease) ([place]) 02/24/2016   Family history of premature CAD 02/24/2016   Anticoagulated on warfarin 02/21/2016   Gastritis    Other specified diseases of esophagus    Hiatal hernia    Gastritis and gastroduodenitis    Ischemia of lower extremity    Arterial occlusion (H[facility])    Atherosclerosis of aorta (H[facility])    History of smoking    Hyperlipidemia    Pain in the chest    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 02/05/2016   Carotid artery stenosis 01/04/2016   Vertigo, benign paroxysmal 12/28/2015   Clinical depression 09/06/2015   History of alcoholism ([place]) 09/06/2015   Angiopathy, 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[place]) 09/06/2015   Peripheral vascular disease ([place]) 09/06/2015   Acute non-recurrent maxillary sinusitis 07/14/2015   Vaginal pruritus 05/26/2015   Chronic recurrent major depressive disorder ([place]) 03/09/2015   Osteoporosis, post-menopausal 03/09/2015   Peripheral blood vessel disorder ([place]) 03/09/2015   Acid reflux 03/09/2015   GERD (gastroesophageal reflux disease) 12/21/2014   Carotid artery narrowing 01/28/2014   Peripheral arterial occlusive disease ([place]) 06/08/2011   Occlusion and stenosis of unspecified carotid artery 06/08/2011   PAD (peripheral artery disease) ([place]) 06/08/2011    Past Surgical History:  Procedure Laterality Date   ADENOIDECTOMY     BREAST CYST ASPIRATION Left    CARDIAC CATHETERIZATION  02/15/2017   [facility]   COLONOSCOPY WITH PROPOFOL N/A 10/22/2017  Procedure: COLONOSCOPY WITH PROPOFOL;  Surgeon: Lucilla Lame, MD;  Location: [place];  Service: Endoscopy;  Laterality: N/A;  specimens not taken--pt on Plavix will be brought back in after 7 days off med   COLONOSCOPY WITH PROPOFOL N/A 11/05/2017   Procedure: COLONOSCOPY WITH PROPOFOL;  Surgeon: Lucilla Lame, MD;  Location: [place];  Service: Endoscopy;  Laterality: N/A;    ESOPHAGOGASTRODUODENOSCOPY N/A 12/21/2014   Procedure: ESOPHAGOGASTRODUODENOSCOPY (EGD);  Surgeon: Lucilla Lame, MD;  Location: [place];  Service: Gastroenterology;  Laterality: N/A;   ESOPHAGOGASTRODUODENOSCOPY (EGD) WITH PROPOFOL N/A 02/08/2016   Procedure: ESOPHAGOGASTRODUODENOSCOPY (EGD) WITH PROPOFOL;  Surgeon: Lucilla Lame, MD;  Location: [facility];  Service: Endoscopy;  Laterality: N/A;   FASCIOTOMY Right 05/30/2019   Procedure: FASCIOTOMY;  Surgeon: [name], MD;  Location: [facility];  Service: Vascular;  Laterality: Right;   FASCIOTOMY CLOSURE Right 06/04/2019   Procedure: FASCIOTOMY CLOSURE;  Surgeon: [name], MD;  Location: [facility];  Service: Vascular;  Laterality: Right;   HEMORROIDECTOMY  2014   LOWER EXTREMITY ANGIOGRAPHY Right 05/30/2019   Procedure: LOWER EXTREMITY ANGIOGRAPHY;  Surgeon: [name], MD;  Location: [place] [facility];  Service: Cardiovascular;  Laterality: Right;   PERIPHERAL VASCULAR CATHETERIZATION N/A 02/09/2016   Procedure: Abdominal Aortogram w/Lower Extremity;  Surgeon: [name], MD;  Location: [place] [facility];  Service: Cardiovascular;  Laterality: N/A;   PERIPHERAL VASCULAR CATHETERIZATION Right 02/10/2016   Procedure: Lower Extremity Angiography;  Surgeon: [name], MD;  Location: [place] [facility];  Service: Cardiovascular;  Laterality: Right;   POLYPECTOMY  11/05/2017   Procedure: POLYPECTOMY;  Surgeon: Lucilla Lame, MD;  Location: [place];  Service: Endoscopy;;   TONSILLECTOMY     VASCULAR SURGERY  [phone]   Fem-Pop Bypass    Prior to Admission medications   Medication Sig Start Date End Date Taking? Authorizing Provider  atorvastatin (LIPITOR) 40 MG tablet Take 40 mg by mouth at bedtime.  12/08/16   [provider]  calcium-vitamin D (OSCAL WITH D) 500-200 MG-UNIT tablet Take 2 tablets by mouth daily with breakfast.    [provider]    clopidogrel (PLAVIX) 75 MG tablet Take 1 tablet (75 mg total) by mouth daily. 02/20/19   Kris Hartmann, NP  docusate sodium (COLACE) 100 MG capsule Take 1 capsule (100 mg total) by mouth 2 (two) times daily. 06/06/19   Stegmayer, Joelene Millin A, PA-C  doxycycline (VIBRAMYCIN) 100 MG capsule Take 1 capsule (100 mg total) by mouth 2 (two) times daily. 06/23/19   Kris Hartmann, NP  ibandronate (BONIVA) 150 MG tablet Take 150 mg by mouth every 30 (thirty) days.  12/07/16   [provider]  losartan (COZAAR) 25 MG tablet Take 25 mg by mouth daily.  12/01/16   [provider]  nystatin cream (MYCOSTATIN) Apply 1 application topically 2 (two) times daily. 07/03/19   [provider]  omeprazole (PRILOSEC) 40 MG capsule Take 1 capsule (40 mg total) by mouth daily. 09/30/18   Lucilla Lame, MD  polyethylene glycol (MIRALAX / GLYCOLAX) 17 g packet Take 17 g by mouth daily. 06/07/19   Stegmayer, Joelene Millin A, PA-C  senna-docusate (SENOKOT-S) 8.6-50 MG tablet Take 2 tablets by mouth at bedtime as needed for mild constipation. 06/06/19   Stegmayer, Janalyn [place], PA-C  traMADol (ULTRAM) 50 MG tablet tramadol 50 mg tablet  1 PO every 6 hours for pain FILL 06/12/19    [provider]  venlafaxine XR (EFFEXOR-XR) 75 MG 24 hr capsule TAKE 1 CAPSULE BY MOUTH  DAILY WITH BREAKFAST 06/07/16   Krebs, Amy Lauren, NP  zolpidem (AMBIEN) 5 MG tablet Take 5 mg by mouth at bedtime as needed. 07/03/19   [provider]    Allergies Hydrocodone-acetaminophen, Amoxicillin, and Vicodin [hydrocodone-acetaminophen]  Family History  Problem Relation Age of Onset   CVA Mother    Heart attack Mother    Cancer Father        colon cancer   Colon cancer Father    Heart disease Maternal Uncle    Breast cancer Neg Hx     Social History Social History   Tobacco Use   Smoking status: Former Smoker    Packs/day: 2.00    Years: 25.00    Pack years: 50.00    Types: Cigarettes    Quit  date: 08/22/1991    Years since quitting: 27.8   Smokeless tobacco: Never Used  Substance Use Topics   Alcohol use: No    Alcohol/week: 0.0 standard drinks   Drug use: Yes    Comment: tramadol X4 50mg  daily    Review of Systems  Constitutional: No fever/chills Eyes: No visual changes. ENT: No sore throat. Cardiovascular: Denies chest pain. Respiratory: Denies shortness of breath. Gastrointestinal: No abdominal pain.  No nausea, no vomiting.  No diarrhea.  No constipation. Genitourinary: Negative for dysuria. Musculoskeletal: Negative for back pain. Skin: Negative for rash. Neurological: Negative for headaches, focal weakness   ____________________________________________   PHYSICAL EXAM:  VITAL SIGNS: ED Triage Vitals  Enc Vitals Group     BP 07/03/19 1432 94/63     Pulse Rate 07/03/19 1432 (!) 111     Resp 07/03/19 1432 19     Temp 07/03/19 1432 98.8 F (37.1 C)     Temp Source 07/03/19 1432 Oral     SpO2 --      Weight 07/03/19 1431 123 lb (55.8 kg)     Height 07/03/19 1431 5\' 3"  (1.6 m)     Head Circumference --      Peak Flow --      Pain Score 07/03/19 1431 0     Pain Loc --      Pain Edu? --      Excl. in [place]? --     Constitutional: Alert and oriented. Well appearing and in no acute distress. Eyes: Conjunctivae are normal. PERRL. EOMI. Head: Atraumatic.  Both temples are slightly tender to palpation in both temporal arteries are slightly tender to palpation. Nose: No congestion/rhinnorhea. Mouth/Throat: Mucous membranes are moist.  Oropharynx non-erythematous. Neck: No stridor.   Cardiovascular: Normal rate, regular rhythm. Grossly normal heart sounds.  Good peripheral circulation. Respiratory: Normal respiratory effort.  No retractions. Lungs CTAB. Gastrointestinal: Soft and nontender. No distention. No abdominal bruits.  Musculoskeletal: Right leg still has a dressing on it a little bit of swelling status post compartment syndrome surgery left leg is  normal. Neurologic:  Normal speech and language. No gross focal neurologic deficits are appreciated. Skin:  Skin is warm, dry and intact. No rash noted. Psychiatric: Mood and affect are normal. Speech and behavior are normal.  ____________________________________________   LABS (all labs ordered are listed, but only abnormal results are displayed)  Labs Reviewed  SEDIMENTATION RATE - Abnormal; Notable for the following components:      Result Value   Sed Rate 58 (*)    All other components within normal limits  CBC WITH DIFFERENTIAL/PLATELET - Abnormal;  Notable for the following components:   Hemoglobin 11.9 (*)    RDW 16.3 (*)    All other components within normal limits  BASIC METABOLIC PANEL   ____________________________________________  EKG   ____________________________________________  RADIOLOGY  ED MD interpretation:  Official radiology report(s): No results found.  ____________________________________________   PROCEDURES  Procedure(s) performed (including Critical Care):  Procedures   ____________________________________________   INITIAL IMPRESSION / ASSESSMENT AND PLAN / ED COURSE  Review of the patient's old records show that her blood pressure has been about as low as it is now.  Her pulse rate is a little high.  She feels well however.  I discussed her with Dr. Lazaro Arms they will be able to set up the temporal artery biopsy when they see her on the 19th.  We will discharge her on high-dose prednisone with a taper 10 mg every 2 weeks.             ____________________________________________   FINAL CLINICAL IMPRESSION(S) / ED DIAGNOSES  Final diagnoses:  Temporal arteritis [facility])     ED Discharge Orders    None       Note:  This document was prepared using Dragon voice recognition software and may include unintentional dictation errors.    Nena Polio, MD 07/03/19 1610

## 2019-07-03 NOTE — ED Triage Notes (Signed)
Pt reports did a virtual visit with her PMD who sent her to ED to be evaluated for temporal arteritis. Pt denies pain at this time and denies other sx's.

## 2019-07-03 NOTE — Discharge Instructions (Addendum)
I spoke with Dr. Lucky Cowboy.   He and Dr. Delana Meyer will plan on setting up your temporal artery biopsy on the 19th when you see them in the office.  Please contact your primary care doctor.  We will get you on a steroid taper.  I have given you 6 weeks of it but your primary care doctor will need to finish it.  You will have to be tapered down to nothing over ia few more weeks than what I have given you.  They may adjust that if need be.  I would have your primary doctor see you in the next week or 2.

## 2019-07-03 NOTE — Telephone Encounter (Signed)
Typically veins that are on the side of the head are out of our scope of practice generally.  Also, the patient has had extensive work to her legs, which wouldn't affect veins on her leg.  If she's not having headaches or vision issues, it likely is not a concern.  If she's worried she can contact her PCP for further workup

## 2019-07-03 NOTE — Telephone Encounter (Signed)
Patient informed that she had headache on Tuesday and Wednesday with no vision issue,but today she has temperature of 99.7. Patient was made aware with medical advice and verbalized understanding.

## 2019-07-10 ENCOUNTER — Other Ambulatory Visit (INDEPENDENT_AMBULATORY_CARE_PROVIDER_SITE_OTHER): Payer: Self-pay | Admitting: Nurse Practitioner

## 2019-07-10 ENCOUNTER — Other Ambulatory Visit: Payer: Self-pay

## 2019-07-10 ENCOUNTER — Telehealth (INDEPENDENT_AMBULATORY_CARE_PROVIDER_SITE_OTHER): Payer: Self-pay

## 2019-07-10 ENCOUNTER — Ambulatory Visit (INDEPENDENT_AMBULATORY_CARE_PROVIDER_SITE_OTHER): Payer: Medicare Other | Admitting: Vascular Surgery

## 2019-07-10 ENCOUNTER — Ambulatory Visit (INDEPENDENT_AMBULATORY_CARE_PROVIDER_SITE_OTHER): Payer: Medicare Other

## 2019-07-10 ENCOUNTER — Encounter (INDEPENDENT_AMBULATORY_CARE_PROVIDER_SITE_OTHER): Payer: Self-pay | Admitting: Vascular Surgery

## 2019-07-10 VITALS — BP 139/82 | HR 96 | Resp 16 | Wt 125.0 lb

## 2019-07-10 DIAGNOSIS — I872 Venous insufficiency (chronic) (peripheral): Secondary | ICD-10-CM

## 2019-07-10 DIAGNOSIS — E7849 Other hyperlipidemia: Secondary | ICD-10-CM

## 2019-07-10 DIAGNOSIS — I779 Disorder of arteries and arterioles, unspecified: Secondary | ICD-10-CM

## 2019-07-10 DIAGNOSIS — I70213 Atherosclerosis of native arteries of extremities with intermittent claudication, bilateral legs: Secondary | ICD-10-CM | POA: Diagnosis not present

## 2019-07-10 DIAGNOSIS — M316 Other giant cell arteritis: Secondary | ICD-10-CM | POA: Diagnosis not present

## 2019-07-10 DIAGNOSIS — I25119 Atherosclerotic heart disease of native coronary artery with unspecified angina pectoris: Secondary | ICD-10-CM

## 2019-07-10 DIAGNOSIS — I6523 Occlusion and stenosis of bilateral carotid arteries: Secondary | ICD-10-CM

## 2019-07-10 NOTE — Telephone Encounter (Signed)
Spoke with the patient and gave her the surgery day of 07/14/2019 for temporal biopsy and her pre-op and Covid testing on 07/11/2019 at 2:30 pm at the [facility]. Pre-surgical instructions will be discussed at her pre-op on 07/11/2019.

## 2019-07-11 ENCOUNTER — Encounter
Admission: RE | Admit: 2019-07-11 | Discharge: 2019-07-11 | Disposition: A | Discharge status: Home / Self Care | Payer: Medicare Other | Source: Ambulatory Visit | Attending: Vascular Surgery | Admitting: Vascular Surgery

## 2019-07-11 DIAGNOSIS — Z01818 Encounter for other preprocedural examination: Secondary | ICD-10-CM | POA: Insufficient documentation

## 2019-07-11 DIAGNOSIS — Z20828 Contact with and (suspected) exposure to other viral communicable diseases: Secondary | ICD-10-CM | POA: Diagnosis not present

## 2019-07-11 HISTORY — DX: Unspecified osteoarthritis, unspecified site: M19.90

## 2019-07-11 LAB — TYPE AND SCREEN
ABO/RH(D): O POS
Antibody Screen: NEGATIVE
Extend sample reason: TRANSFUSED

## 2019-07-11 LAB — PROTIME-INR
INR: 1 (ref 0.8–1.2)
Prothrombin Time: 13.3 seconds (ref 11.4–15.2)

## 2019-07-11 LAB — APTT: aPTT: 30 seconds (ref 24–36)

## 2019-07-11 NOTE — Patient Instructions (Addendum)
Your procedure is scheduled on: 07-14-19  Report to Same Day Surgery 2nd floor medical mall [facility] Entrance-take elevator on left to 2nd floor.  Check in with surgery information desk.) @ 12:30 pm   Remember: Instructions that are not followed completely may result in serious medical risk, up to and including death, or upon the discretion of your surgeon and anesthesiologist your surgery may need to be rescheduled.    _x___ 1. Do not eat food after midnight the night before your procedure. NO GUM OR CANDY AFTER MIDNIGHT. You may drink clear liquids up to 2 hours before you are scheduled to arrive at the hospital for your procedure.  Do not drink clear liquids within 2 hours of your scheduled arrival to the hospital.  Clear liquids include  --Water or Apple juice without pulp  --Gatorade  --Black Coffee or Clear Tea (No milk, no creamers, do not add anything to the coffee or Tea   ____Ensure clear carbohydrate drink on the way to the hospital for bariatric patients  ____Ensure clear carbohydrate drink 3 hours before surgery.    __x__ 2. No Alcohol for 24 hours before or after surgery.   __x__3. No Smoking or e-cigarettes for 24 prior to surgery.  Do not use any chewable tobacco products for at least 6 hour prior to surgery   ____  4. Bring all medications with you on the day of surgery if instructed.    __x__ 5. Notify your doctor if there is any change in your medical condition     (cold, fever, infections).    x___6. On the morning of surgery brush your teeth with toothpaste and water.  You may rinse your mouth with mouth wash if you wish.  Do not swallow any toothpaste or mouthwash.   Do not wear jewelry, make-up, hairpins, clips or nail polish.  Do not wear lotions, powders, or perfumes. You may wear deodorant.  Do not shave 48 hours prior to surgery. Men may shave face and neck.  Do not bring valuables to the hospital.    [facility] is not responsible for any belongings  or valuables.               Contacts, dentures or bridgework may not be worn into surgery.  Leave your suitcase in the car. After surgery it may be brought to your room.  For patients admitted to the hospital, discharge time is determined by your                       treatment team.  _  Patients discharged the day of surgery will not be allowed to drive home.  You will need someone to drive you home and stay with you the night of your procedure.    Please read over the following fact sheets that you were given:   [facility] Preparing for Surgery and or MRSA Information   _x___ TAKE THE FOLLOWING MEDICATION THE MORNING OF SURGERY WITH A SMALL SIP OF WATER. These include:  1. TRAMADOL (ULTRAM)  2. EFFEXOR (VENLAFAXINE)  3. PREDNISONE  4. PRILOSEC (OMEPRAZOLE)  5. TAKE AN EXTRA OMEPRAZOLE THE NIGHT BEFORE YOUR SURGERY  6.  ____Fleets enema or Magnesium Citrate as directed.   ____ Use CHG Soap or sage wipes as directed on instruction sheet   ____ Use inhalers on the day of surgery and bring to hospital day of surgery  ____ Stop Metformin and Janumet 2 days prior to surgery.  ____ Take 1/2 of usual insulin dose the night before surgery and none on the morning surgery.   _x___ Follow recommendations from Cardiologist, Pulmonologist or PCP regarding stopping Aspirin, Coumadin, Plavix ,Eliquis, Effient, or Pradaxa, and Pletal-CONTINUE PLAVIX (DO NOT TAKE AM OF PROCEDURE) AND STOP ELIQUIS 1 DAY PRIOR TO SURGERY (LAST DOSE ON Saturday 07-12-19)  X____Stop Anti-inflammatories such as Advil, Aleve, Ibuprofen, Motrin, Naproxen, Naprosyn, Goodies powders or aspirin products NOW-OK to take Tylenol    ____ Stop supplements until after surgery.    ____ Bring C-Pap to the hospital.

## 2019-07-12 LAB — SARS CORONAVIRUS 2 (TAT 6-24 HRS): SARS Coronavirus 2: NEGATIVE

## 2019-07-13 ENCOUNTER — Encounter (INDEPENDENT_AMBULATORY_CARE_PROVIDER_SITE_OTHER): Payer: Self-pay | Admitting: Vascular Surgery

## 2019-07-13 DIAGNOSIS — M316 Other giant cell arteritis: Secondary | ICD-10-CM | POA: Insufficient documentation

## 2019-07-13 NOTE — Progress Notes (Signed)
MRN : [id number]  Sara [name] is a 68 y.o. ([birth year]) female who presents with chief complaint of  Chief Complaint  Patient presents with  . Follow-up    ultrasound follow up  .  History of Present Illness:   Incisions have healed She denies pain in her right foot  Patient is also sent for evaluation of headache and the possible need for temporal artery biopsy.  Patient notes the headache is located right more than left The patient describes the headache as sharp aching The patient notes the headache is very severe saying that it is always greater than 8 out of 10 on the pain scale. The duration of the headache has been for more than 1 week. The patient notes timing of the headache is occurring on a daily basis frequently it waxes and wanes and can become quite intense more than once a day. There do not appear to be any aggravating symptoms. The patient notes rest and laying down helps a little Tylenol has not been effective.  Otherwise they have not been able to obtain relief. Patient does not have a history of migraine headaches. The patient notes that the headache is associated with no visual changes  Current Meds  Medication Sig  . apixaban (ELIQUIS) 5 MG TABS tablet Take 5 mg by mouth 2 (two) times daily.  Marland Kitchen atorvastatin (LIPITOR) 40 MG tablet Take 40 mg by mouth at bedtime.   . clopidogrel (PLAVIX) 75 MG tablet Take 1 tablet (75 mg total) by mouth daily.  Marland Kitchen ibandronate (BONIVA) 150 MG tablet Take 150 mg by mouth every 30 (thirty) days.   Marland Kitchen losartan (COZAAR) 25 MG tablet Take 25 mg by mouth every morning.   Marland Kitchen omeprazole (PRILOSEC) 40 MG capsule Take 1 capsule (40 mg total) by mouth daily. (Patient taking differently: Take 40 mg by mouth every morning. )  . predniSONE (DELTASONE) 10 MG tablet Take 1 tablet (10 mg total) by mouth daily. Take 6 tablets by mouth every day for 2 weeks then begin taking 5 tablets every day for 2 weeks then take 4 tablets a day every day for 2  weeks.  Your primary care doctor will continue the taper.  . traMADol (ULTRAM) 50 MG tablet Take 50 mg by mouth 4 (four) times daily.   Marland Kitchen venlafaxine XR (EFFEXOR-XR) 75 MG 24 hr capsule TAKE 1 CAPSULE BY MOUTH  DAILY WITH BREAKFAST (Patient taking differently: Take 75 mg by mouth daily with breakfast. )  . zolpidem (AMBIEN) 5 MG tablet Take 5 mg by mouth at bedtime as needed for sleep.   . [DISCONTINUED] nystatin cream (MYCOSTATIN) Apply 1 application topically 2 (two) times daily.    Past Medical History:  Diagnosis Date  . Arthritis    right shoulder  . Depression   . GERD (gastroesophageal reflux disease)   . History of blood clots   . Hyperlipidemia   . Hypertension   . Mild mitral regurgitation   . Osteoporosis   . Stomach ulcer   . Vascular disease    Sees Dr. Delana Meyer  . Vertigo    Last episode approx Aug 2015  . Wears dentures    full upper    Past Surgical History:  Procedure Laterality Date  . ADENOIDECTOMY    . BREAST CYST ASPIRATION Left   . CARDIAC CATHETERIZATION  02/15/2017   [facility]  . COLONOSCOPY WITH PROPOFOL N/A 10/22/2017   Procedure: COLONOSCOPY WITH PROPOFOL;  Surgeon: Lucilla Lame, MD;  Location: [facility];  Service: Endoscopy;  Laterality: N/A;  specimens not taken--pt on Plavix will be brought back in after 7 days off med  . COLONOSCOPY WITH PROPOFOL N/A 11/05/2017   Procedure: COLONOSCOPY WITH PROPOFOL;  Surgeon: Lucilla Lame, MD;  Location: [place];  Service: Endoscopy;  Laterality: N/A;  . ESOPHAGOGASTRODUODENOSCOPY N/A 12/21/2014   Procedure: ESOPHAGOGASTRODUODENOSCOPY (EGD);  Surgeon: Lucilla Lame, MD;  Location: [place];  Service: Gastroenterology;  Laterality: N/A;  . ESOPHAGOGASTRODUODENOSCOPY (EGD) WITH PROPOFOL N/A 02/08/2016   Procedure: ESOPHAGOGASTRODUODENOSCOPY (EGD) WITH PROPOFOL;  Surgeon: Lucilla Lame, MD;  Location: [facility];  Service: Endoscopy;  Laterality: N/A;  . FASCIOTOMY Right 05/30/2019   Procedure:  FASCIOTOMY;  Surgeon: [name], MD;  Location: [facility];  Service: Vascular;  Laterality: Right;  . FASCIOTOMY CLOSURE Right 06/04/2019   Procedure: FASCIOTOMY CLOSURE;  Surgeon: [name], MD;  Location: [facility];  Service: Vascular;  Laterality: Right;  . HEMORROIDECTOMY  2014  . LOWER EXTREMITY ANGIOGRAPHY Right 05/30/2019   Procedure: LOWER EXTREMITY ANGIOGRAPHY;  Surgeon: [name], MD;  Location: [place] [facility];  Service: Cardiovascular;  Laterality: Right;  . PERIPHERAL VASCULAR CATHETERIZATION N/A 02/09/2016   Procedure: Abdominal Aortogram w/Lower Extremity;  Surgeon: [name], MD;  Location: [place] CV [facility];  Service: Cardiovascular;  Laterality: N/A;  . PERIPHERAL VASCULAR CATHETERIZATION Right 02/10/2016   Procedure: Lower Extremity Angiography;  Surgeon: [name], MD;  Location: [place] CV [facility];  Service: Cardiovascular;  Laterality: Right;  . POLYPECTOMY  11/05/2017   Procedure: POLYPECTOMY;  Surgeon: Lucilla Lame, MD;  Location: [place];  Service: Endoscopy;;  . TONSILLECTOMY    . VASCULAR SURGERY  [id number]   Fem-Pop Bypass    Social History Social History   Tobacco Use  . Smoking status: Former Smoker    Packs/day: 2.00    Years: 25.00    Pack years: 50.00    Types: Cigarettes    Quit date: 08/22/1991    Years since quitting: 27.9  . Smokeless tobacco: Never Used  Substance Use Topics  . Alcohol use: No    Alcohol/week: 0.0 standard drinks  . Drug use: Not Currently    Family History Family History  Problem Relation Age of Onset  . CVA Mother   . Heart attack Mother   . Cancer Father        colon cancer  . Colon cancer Father   . Heart disease Maternal Uncle   . Breast cancer Neg Hx     Allergies  Allergen Reactions  . Amoxicillin Other (See Comments)    Yeast infection  . Oxycodone Itching  . Metoprolol Tartrate Rash  . Vicodin [Hydrocodone-Acetaminophen] Hives and Rash     Flushing     REVIEW OF SYSTEMS (Negative unless checked)  Constitutional: [] Weight loss  [] Fever  [] Chills Cardiac: [x] Chest pain   [] Chest pressure   [] Palpitations   [] Shortness of breath when laying flat   [x] Shortness of breath with exertion. Vascular:  [] Pain in legs with walking   [] Pain in legs at rest  [] History of DVT   [] Phlebitis   [] Swelling in legs   [] Varicose veins   [] Non-healing ulcers Pulmonary:   [] Uses home oxygen   [] Productive cough   [] Hemoptysis   [] Wheeze  [x] COPD   [] Asthma Neurologic:  [] Dizziness   [] Seizures   [] History of stroke   [] History of TIA  [] Aphasia   [] Vissual changes   [] Weakness or numbness in arm   []   Weakness or numbness in leg Musculoskeletal:   [] Joint swelling   [] Joint pain   [] Low back pain Hematologic:  [] Easy bruising  [] Easy bleeding   [] Hypercoagulable state   [] Anemic Gastrointestinal:  [] Diarrhea   [] Vomiting  [] Gastroesophageal reflux/heartburn   [] Difficulty swallowing. Genitourinary:  [] Chronic kidney disease   [] Difficult urination  [] Frequent urination   [] Blood in urine Skin:  [] Rashes   [] Ulcers  Psychological:  [] History of anxiety   []  History of major depression.  Physical Examination  Vitals:   07/10/19 1149  BP: 139/82  Pulse: 96  Resp: 16  Weight: 125 lb (56.7 kg)   Body mass index is 22.14 kg/m. Gen: WD/WN, NAD Head: [place]/AT, No temporalis wasting.  Ear/Nose/Throat: Hearing grossly intact, nares w/o erythema or drainage Eyes: PER, EOMI, sclera nonicteric.  Neck: Supple, no large masses.   Pulmonary:  Good air movement, no audible wheezing bilaterally, no use of accessory muscles.  Cardiac: RRR, no JVD Vascular: fasciotomies healed; temples nontender Vessel Right Left  Radial Palpable Palpable  PT Not Palpable Not Palpable  DP Not Palpable Not Palpable  Gastrointestinal: Non-distended. No guarding/no peritoneal signs.  Musculoskeletal: M/S 5/5 throughout.  No deformity or atrophy.  Neurologic: CN 2-12 intact.  Symmetrical.  Speech is fluent. Motor exam as listed above. Psychiatric: Judgment intact, Mood & affect appropriate for pt's clinical situation. Dermatologic: No rashes or ulcers noted.  No changes consistent with cellulitis. Lymph : No lichenification or skin changes of chronic lymphedema.  CBC Lab Results  Component Value Date   WBC 7.3 07/03/2019   HGB 11.9 (L) 07/03/2019   HCT 36.7 07/03/2019   MCV 88.2 07/03/2019   PLT 263 07/03/2019    BMET    Component Value Date/Time   NA 137 07/03/2019 1442   NA 139 09/21/2015 0916   K 3.9 07/03/2019 1442   CL 102 07/03/2019 1442   CO2 25 07/03/2019 1442   GLUCOSE 96 07/03/2019 1442   BUN 11 07/03/2019 1442   BUN 12 09/21/2015 0916   CREATININE 0.69 07/03/2019 1442   CREATININE 0.88 05/16/2016 1034   CALCIUM 9.0 07/03/2019 1442   GFRNONAA >60 07/03/2019 1442   GFRNONAA 66 05/01/2016 0846   GFRAA >60 07/03/2019 1442   GFRAA 76 05/01/2016 0846   Estimated Creatinine Clearance: 57.2 mL/min (by C-G formula based on SCr of 0.69 mg/dL).  COAG Lab Results  Component Value Date   INR 1.0 07/11/2019   INR 1.0 06/04/2019   INR 2.2 (H) 06/01/2019    Radiology No results found.   Assessment/Plan 1. Temporal arteritis [facility]) Patient should undergo right temporal artery biopsy.  Risk and benefits were reviewed the patient.  Indications for the procedure were reviewed.  All questions were answered, the patient agrees to proceed.   2. Atherosclerosis of native artery of both lower extremities with intermittent claudication (HCC) Recommend:  The patient is status post successful angiogram with intervention.  The patient reports that the claudication symptoms and leg pain is essentially gone.   The patient denies lifestyle limiting changes at this point in time.  No further invasive studies, angiography or surgery at this time The patient should continue walking and begin a more formal exercise program.  The patient should continue  antiplatelet therapy and aggressive treatment of the lipid abnormalities  Smoking cessation was again discussed  The patient should continue wearing graduated compression socks 10-15 mmHg strength to control the mild edema.  Patient should undergo noninvasive studies as ordered. The patient will follow  up with me after the studies.   - LE ARTERIAL; Future - ABI; Future  3. Bilateral carotid artery stenosis Recommend:  Given the patient's asymptomatic subcritical stenosis no further invasive testing or surgery at this time.  Duplex ultrasound shows <70% stenosis bilaterally.  Continue antiplatelet therapy as prescribed Continue management of CAD, HTN and Hyperlipidemia Healthy heart diet,  encouraged exercise at least 4 times per week Follow up in 12 months with duplex ultrasound and physical exam   4. Chronic venous insufficiency No surgery or intervention at this point in time.    I have had a long discussion with the patient regarding venous insufficiency and why it  causes symptoms. I have discussed with the patient the chronic skin changes that accompany venous insufficiency and the long term sequela such as infection and ulceration.  Patient will begin wearing graduated compression stockings class 1 (20-30 mmHg) or compression wraps on a daily basis a prescription was given. The patient will put the stockings on first thing in the morning and removing them in the evening. The patient is instructed specifically not to sleep in the stockings.    In addition, behavioral modification including several periods of elevation of the lower extremities during the day will be continued. I have demonstrated that proper elevation is a position with the ankles at heart level.  The patient is instructed to begin routine exercise, especially walking on a daily basis   5. Other hyperlipidemia Continue statin as ordered and reviewed, no changes at this time   6. Coronary artery disease  involving native coronary artery of native heart with angina pectoris ([place]) Continue cardiac and antihypertensive medications as already ordered and reviewed, no changes at this time.  Continue statin as ordered and reviewed, no changes at this time  Nitrates PRN for chest pain    [name], MD  07/13/2019 12:10 PM

## 2019-07-14 ENCOUNTER — Ambulatory Visit
Admission: RE | Admit: 2019-07-14 | Discharge: 2019-07-14 | Disposition: A | Discharge status: Home / Self Care | Payer: Medicare Other | Source: Ambulatory Visit | Attending: Vascular Surgery | Admitting: Vascular Surgery

## 2019-07-14 ENCOUNTER — Ambulatory Visit: Payer: Medicare Other | Admitting: Certified Registered"

## 2019-07-14 ENCOUNTER — Other Ambulatory Visit: Payer: Self-pay

## 2019-07-14 ENCOUNTER — Encounter
Admission: RE | Disposition: A | Discharge status: Home / Self Care | Payer: Self-pay | Source: Ambulatory Visit | Attending: Vascular Surgery

## 2019-07-14 ENCOUNTER — Encounter: Payer: Self-pay | Admitting: *Deleted

## 2019-07-14 DIAGNOSIS — Z823 Family history of stroke: Secondary | ICD-10-CM | POA: Diagnosis not present

## 2019-07-14 DIAGNOSIS — I10 Essential (primary) hypertension: Secondary | ICD-10-CM | POA: Diagnosis not present

## 2019-07-14 DIAGNOSIS — I340 Nonrheumatic mitral (valve) insufficiency: Secondary | ICD-10-CM | POA: Insufficient documentation

## 2019-07-14 DIAGNOSIS — Z800 Family history of malignant neoplasm of digestive organs: Secondary | ICD-10-CM | POA: Diagnosis not present

## 2019-07-14 DIAGNOSIS — M316 Other giant cell arteritis: Secondary | ICD-10-CM | POA: Diagnosis not present

## 2019-07-14 DIAGNOSIS — K449 Diaphragmatic hernia without obstruction or gangrene: Secondary | ICD-10-CM | POA: Insufficient documentation

## 2019-07-14 DIAGNOSIS — Z7902 Long term (current) use of antithrombotics/antiplatelets: Secondary | ICD-10-CM | POA: Insufficient documentation

## 2019-07-14 DIAGNOSIS — R519 Headache, unspecified: Secondary | ICD-10-CM | POA: Diagnosis not present

## 2019-07-14 DIAGNOSIS — F329 Major depressive disorder, single episode, unspecified: Secondary | ICD-10-CM | POA: Diagnosis not present

## 2019-07-14 DIAGNOSIS — E785 Hyperlipidemia, unspecified: Secondary | ICD-10-CM | POA: Diagnosis not present

## 2019-07-14 DIAGNOSIS — Z8249 Family history of ischemic heart disease and other diseases of the circulatory system: Secondary | ICD-10-CM | POA: Diagnosis not present

## 2019-07-14 DIAGNOSIS — K219 Gastro-esophageal reflux disease without esophagitis: Secondary | ICD-10-CM | POA: Insufficient documentation

## 2019-07-14 DIAGNOSIS — Z885 Allergy status to narcotic agent status: Secondary | ICD-10-CM | POA: Insufficient documentation

## 2019-07-14 DIAGNOSIS — Z79899 Other long term (current) drug therapy: Secondary | ICD-10-CM | POA: Insufficient documentation

## 2019-07-14 DIAGNOSIS — I872 Venous insufficiency (chronic) (peripheral): Secondary | ICD-10-CM | POA: Diagnosis not present

## 2019-07-14 DIAGNOSIS — Z880 Allergy status to penicillin: Secondary | ICD-10-CM | POA: Insufficient documentation

## 2019-07-14 DIAGNOSIS — Z888 Allergy status to other drugs, medicaments and biological substances status: Secondary | ICD-10-CM | POA: Diagnosis not present

## 2019-07-14 DIAGNOSIS — Z87891 Personal history of nicotine dependence: Secondary | ICD-10-CM | POA: Insufficient documentation

## 2019-07-14 DIAGNOSIS — I2510 Atherosclerotic heart disease of native coronary artery without angina pectoris: Secondary | ICD-10-CM | POA: Insufficient documentation

## 2019-07-14 DIAGNOSIS — I739 Peripheral vascular disease, unspecified: Secondary | ICD-10-CM | POA: Insufficient documentation

## 2019-07-14 DIAGNOSIS — K279 Peptic ulcer, site unspecified, unspecified as acute or chronic, without hemorrhage or perforation: Secondary | ICD-10-CM | POA: Insufficient documentation

## 2019-07-14 DIAGNOSIS — Z86718 Personal history of other venous thrombosis and embolism: Secondary | ICD-10-CM | POA: Insufficient documentation

## 2019-07-14 DIAGNOSIS — M810 Age-related osteoporosis without current pathological fracture: Secondary | ICD-10-CM | POA: Diagnosis not present

## 2019-07-14 HISTORY — PX: ARTERY BIOPSY: SHX891

## 2019-07-14 SURGERY — BIOPSY TEMPORAL ARTERY
Anesthesia: General | Site: Face | Laterality: Right

## 2019-07-14 MED ORDER — GLYCOPYRROLATE 0.2 MG/ML IJ SOLN
INTRAMUSCULAR | Status: DC | PRN
  Administered 2019-07-14: 0.2 mg via INTRAVENOUS

## 2019-07-14 MED ORDER — HYDROMORPHONE HCL 1 MG/ML IJ SOLN: 1.0000 mg | Freq: Once | INTRAMUSCULAR | Status: DC | PRN

## 2019-07-14 MED ORDER — FENTANYL CITRATE (PF) 100 MCG/2ML IJ SOLN
INTRAMUSCULAR | Status: DC | PRN
  Administered 2019-07-14 (×2): 50 ug via INTRAVENOUS

## 2019-07-14 MED ORDER — ONDANSETRON HCL 4 MG/2ML IJ SOLN: 4.0000 mg | Freq: Four times a day (QID) | INTRAMUSCULAR | Status: DC | PRN

## 2019-07-14 MED ORDER — LIDOCAINE-EPINEPHRINE 1 %-1:100000 IJ SOLN
INTRAMUSCULAR | Status: DC | PRN
  Administered 2019-07-14: 6 mL

## 2019-07-14 MED ORDER — MIDAZOLAM HCL 5 MG/5ML IJ SOLN
INTRAMUSCULAR | Status: DC | PRN
  Administered 2019-07-14: 2 mg via INTRAVENOUS

## 2019-07-14 MED ORDER — CHLORHEXIDINE GLUCONATE CLOTH 2 % EX PADS: 6.0000 | MEDICATED_PAD | Freq: Once | CUTANEOUS | Status: DC

## 2019-07-14 MED ORDER — FENTANYL CITRATE (PF) 100 MCG/2ML IJ SOLN
INTRAMUSCULAR | Status: AC
  Filled 2019-07-14: qty 2

## 2019-07-14 MED ORDER — CLINDAMYCIN PHOSPHATE 300 MG/50ML IV SOLN
300.0000 mg | INTRAVENOUS | Status: AC
  Administered 2019-07-14: 300 mg via INTRAVENOUS

## 2019-07-14 MED ORDER — BUPIVACAINE HCL (PF) 0.5 % IJ SOLN
INTRAMUSCULAR | Status: AC
  Filled 2019-07-14: qty 30

## 2019-07-14 MED ORDER — PROPOFOL 500 MG/50ML IV EMUL
INTRAVENOUS | Status: DC | PRN
  Administered 2019-07-14: 50 ug/kg/min via INTRAVENOUS

## 2019-07-14 MED ORDER — PROPOFOL 10 MG/ML IV BOLUS
INTRAVENOUS | Status: AC
  Filled 2019-07-14: qty 20

## 2019-07-14 MED ORDER — LIDOCAINE-EPINEPHRINE 1 %-1:100000 IJ SOLN
INTRAMUSCULAR | Status: AC
  Filled 2019-07-14: qty 1

## 2019-07-14 MED ORDER — FENTANYL CITRATE (PF) 100 MCG/2ML IJ SOLN: 25.0000 ug | INTRAMUSCULAR | Status: DC | PRN

## 2019-07-14 MED ORDER — BUPIVACAINE HCL (PF) 0.5 % IJ SOLN
INTRAMUSCULAR | Status: DC | PRN
  Administered 2019-07-14: 6 mL

## 2019-07-14 MED ORDER — MIDAZOLAM HCL 2 MG/2ML IJ SOLN
INTRAMUSCULAR | Status: AC
  Filled 2019-07-14: qty 2

## 2019-07-14 MED ORDER — PROPOFOL 10 MG/ML IV BOLUS
INTRAVENOUS | Status: DC | PRN
  Administered 2019-07-14: 50 mg via INTRAVENOUS

## 2019-07-14 MED ORDER — LIDOCAINE 2% (20 MG/ML) 5 ML SYRINGE
INTRAMUSCULAR | Status: DC | PRN
  Administered 2019-07-14: 50 mg via INTRAVENOUS

## 2019-07-14 MED ORDER — LACTATED RINGERS IV SOLN
INTRAVENOUS | Status: DC
  Administered 2019-07-14: 15:00:00 via INTRAVENOUS

## 2019-07-14 MED ORDER — CLINDAMYCIN PHOSPHATE 300 MG/50ML IV SOLN
INTRAVENOUS | Status: AC
  Filled 2019-07-14: qty 50

## 2019-07-14 SURGICAL SUPPLY — 39 items
BLADE CLIPPER SURG (BLADE) ×2 IMPLANT
BLADE SURG 15 STRL LF DISP TIS (BLADE) ×1 IMPLANT
BLADE SURG 15 STRL SS (BLADE) ×1
COTTON BALL STRL MEDIUM (GAUZE/BANDAGES/DRESSINGS) ×2 IMPLANT
COVER WAND RF STERILE (DRAPES) ×2 IMPLANT
DERMABOND ADVANCED (GAUZE/BANDAGES/DRESSINGS) ×1
DERMABOND ADVANCED .7 DNX12 (GAUZE/BANDAGES/DRESSINGS) ×1 IMPLANT
DRAPE LAPAROTOMY 77X122 PED (DRAPES) ×2 IMPLANT
DRSG TELFA 4X3 1S NADH ST (GAUZE/BANDAGES/DRESSINGS) ×2 IMPLANT
ELECT CAUTERY BLADE 6.4 (BLADE) ×2 IMPLANT
ELECT REM PT RETURN 9FT ADLT (ELECTROSURGICAL) ×2
ELECTRODE REM PT RTRN 9FT ADLT (ELECTROSURGICAL) ×1 IMPLANT
GLOVE BIO SURGEON STRL SZ7 (GLOVE) ×2 IMPLANT
GLOVE INDICATOR 7.5 STRL GRN (GLOVE) ×2 IMPLANT
GLOVE SURG SYN 8.0 (GLOVE) ×2 IMPLANT
GLOVE SURG SYN 8.0 PF PI (GLOVE) ×1 IMPLANT
GOWN L4 XLG 20 PK N/S (GOWN DISPOSABLE) ×2 IMPLANT
GOWN STRL REUS W/ TWL LRG LVL3 (GOWN DISPOSABLE) ×2 IMPLANT
GOWN STRL REUS W/TWL LRG LVL3 (GOWN DISPOSABLE) ×2
LABEL OR SOLS (LABEL) ×2 IMPLANT
NDL HYPO 25X1 1.5 SAFETY (NEEDLE) ×2 IMPLANT
NEEDLE HYPO 25X1 1.5 SAFETY (NEEDLE) ×4 IMPLANT
NS IRRIG 500ML POUR BTL (IV SOLUTION) ×2 IMPLANT
PACK BASIN MINOR ARMC (MISCELLANEOUS) ×2 IMPLANT
SOL PREP PVP 2OZ (MISCELLANEOUS) ×2
SOLUTION PREP PVP 2OZ (MISCELLANEOUS) ×1 IMPLANT
SUCTION FRAZIER HANDLE 10FR (MISCELLANEOUS) ×1
SUCTION TUBE FRAZIER 10FR DISP (MISCELLANEOUS) ×1 IMPLANT
SUT MNCRL AB 4-0 PS2 18 (SUTURE) ×2 IMPLANT
SUT SILK 3 0 (SUTURE) ×1
SUT SILK 3-0 18XBRD TIE 12 (SUTURE) ×1 IMPLANT
SUT SILK 4 0 (SUTURE) ×1
SUT SILK 4-0 18XBRD TIE 12 (SUTURE) ×1 IMPLANT
SUT VIC AB 3-0 SH 27 (SUTURE) ×1
SUT VIC AB 3-0 SH 27X BRD (SUTURE) ×1 IMPLANT
SUT VIC AB 4-0 SH 27 (SUTURE) ×1
SUT VIC AB 4-0 SH 27XANBCTRL (SUTURE) IMPLANT
SYR 10ML LL (SYRINGE) ×4 IMPLANT
SYR BULB IRRIG 60ML STRL (SYRINGE) ×2 IMPLANT

## 2019-07-14 NOTE — Transfer of Care (Signed)
Immediate Anesthesia Transfer of Care Note  Patient: Sara [name]  Procedure(s) Performed: BIOPSY TEMPORAL ARTERY (Right Face)  Patient Location: PACU  Anesthesia Type:General  Level of Consciousness: awake, alert  and oriented  Airway & Oxygen Therapy: Patient Spontanous Breathing  Post-op Assessment: Report given to RN and Post -op Vital signs reviewed and stable  Post vital signs: Reviewed  Last Vitals:  Vitals Value Taken Time  BP 131/78 07/14/19 1627  Temp    Pulse 109 07/14/19 1627  Resp 18 07/14/19 1627  SpO2 100 % 07/14/19 1627    Last Pain:  Vitals:   07/14/19 1219  TempSrc: Temporal  PainSc: 0-No pain         Complications: No apparent anesthesia complications

## 2019-07-14 NOTE — Anesthesia Post-op Follow-up Note (Signed)
Anesthesia QCDR form completed.        

## 2019-07-14 NOTE — Discharge Instructions (Addendum)
AMBULATORY SURGERY  °DISCHARGE INSTRUCTIONS ° ° °1) The drugs that you were given will stay in your system until tomorrow so for the next 24 hours you should not: ° °A) Drive an automobile °B) Make any legal decisions °C) Drink any alcoholic beverage ° ° °2) You may resume regular meals tomorrow.  Today it is better to start with liquids and gradually work up to solid foods. ° °You may eat anything you prefer, but it is better to start with liquids, then soup and crackers, and gradually work up to solid foods. ° ° °3) Please notify your doctor immediately if you have any unusual bleeding, trouble breathing, redness and pain at the surgery site, drainage, fever, or pain not relieved by medication. ° ° ° °4) Additional Instructions: ° ° ° ° ° ° ° °Please contact your physician with any problems or Same Day Surgery at [phone], Monday through Friday 6 am to 4 pm, or [place] at [place] Main number at [phone]. °

## 2019-07-14 NOTE — Anesthesia Preprocedure Evaluation (Addendum)
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, healing well   Neuro/Psych PSYCHIATRIC DISORDERS Depression negative neurological ROS     GI/Hepatic Neg liver ROS, hiatal hernia, PUD, GERD  Controlled,  Endo/Other  negative endocrine ROS  Renal/GU negative Renal ROS  negative genitourinary   Musculoskeletal   Abdominal   Peds  Hematology negative hematology ROS (+)   Anesthesia Other Findings Past Medical History: No date: Arthritis     Comment:  right shoulder No date: Depression No date: GERD (gastroesophageal reflux disease) No date: History of blood clots No date: Hyperlipidemia No date: Hypertension No date: Mild mitral regurgitation No date: Osteoporosis No date: Stomach ulcer No date: Vascular disease     Comment:  Sees Dr. Delana Meyer No date: Vertigo     Comment:  Last episode approx Aug 2015 No date: Wears dentures     Comment:  full upper  Past Surgical History: No date: ADENOIDECTOMY No date: BREAST CYST ASPIRATION; Left 02/15/2017: CARDIAC CATHETERIZATION     Comment:  [facility] 10/22/2017: COLONOSCOPY WITH PROPOFOL; N/A     Comment:  Procedure: COLONOSCOPY WITH PROPOFOL;  Surgeon: Lucilla Lame, MD;  Location: [place];  Service:               Endoscopy;  Laterality: N/A;  specimens not taken--pt on                Plavix will be brought back in after 7 days off med 11/05/2017: COLONOSCOPY WITH PROPOFOL; N/A     Comment:  Procedure: COLONOSCOPY WITH PROPOFOL;  Surgeon: Lucilla Lame, MD;  Location: [place];  Service:               Endoscopy;  Laterality: N/A; 12/21/2014: ESOPHAGOGASTRODUODENOSCOPY; N/A     Comment:  Procedure: ESOPHAGOGASTRODUODENOSCOPY (EGD);  Surgeon:               Lucilla Lame, MD;  Location: [place];                Service: Gastroenterology;  Laterality: N/A; 02/08/2016: ESOPHAGOGASTRODUODENOSCOPY (EGD) WITH PROPOFOL; N/A     Comment:  Procedure: ESOPHAGOGASTRODUODENOSCOPY (EGD) WITH               PROPOFOL;  Surgeon: Lucilla Lame, MD;  Location: [facility]               ENDOSCOPY;  Service: Endoscopy;  Laterality: N/A; 05/30/2019: FASCIOTOMY; Right     Comment:  Procedure: FASCIOTOMY;  Surgeon: [name], MD;              Location: [facility];  Service: Vascular;  Laterality:  Right; 06/04/2019: FASCIOTOMY CLOSURE; Right     Comment:  Procedure: FASCIOTOMY CLOSURE;  Surgeon: [name], MD;  Location: [facility];  Service: Vascular;                Laterality: Right; 2014: HEMORROIDECTOMY 05/30/2019: LOWER EXTREMITY ANGIOGRAPHY; Right     Comment:  Procedure: LOWER EXTREMITY ANGIOGRAPHY;  Surgeon:               [name], MD;  Location: [place] CV LAB;               Service: Cardiovascular;  Laterality: Right; 02/09/2016: PERIPHERAL VASCULAR CATHETERIZATION; N/A     Comment:  Procedure: Abdominal Aortogram w/Lower Extremity;                Surgeon: [name], MD;  Location: [place]               CV LAB;  Service: Cardiovascular;  Laterality: N/A; 02/10/2016: PERIPHERAL VASCULAR CATHETERIZATION; Right     Comment:  Procedure: Lower Extremity Angiography;  Surgeon:               [name], MD;  Location: [place] CV LAB;                Service: Cardiovascular;   Laterality: Right; 11/05/2017: POLYPECTOMY     Comment:  Procedure: POLYPECTOMY;  Surgeon: Lucilla Lame, MD;                Location: [place];  Service: Endoscopy;; No date: TONSILLECTOMY [id number]: VASCULAR SURGERY     Comment:  Fem-Pop Bypass     Reproductive/Obstetrics negative OB ROS                            Anesthesia Physical Anesthesia Plan  ASA: III  Anesthesia Plan: General   Post-op Pain Management:    Induction:   PONV Risk Score and Plan: Propofol infusion and TIVA  Airway Management Planned: Natural Airway and Nasal Cannula  Additional Equipment:   Intra-op Plan:   Post-operative Plan:   Informed Consent: I have reviewed the patients History and Physical, chart, labs and discussed the procedure including the risks, benefits and alternatives for the proposed anesthesia with the patient or authorized representative who has indicated his/her understanding and acceptance.     Dental Advisory Given  Plan Discussed with: Anesthesiologist  Anesthesia Plan Comments:        Anesthesia Quick Evaluation

## 2019-07-14 NOTE — H&P (Addendum)
[facility] History & Physical Update  The patient was interviewed and re-examined.  The patient's previous History and Physical has been reviewed and is unchanged.  There is no change in the plan of care. We plan to proceed with the scheduled procedure.    The plan is for a right temporal artery biopsy.  [name], MD  07/14/2019, 1:56 PM

## 2019-07-14 NOTE — Op Note (Signed)
        OPERATIVE NOTE   PRE-OPERATIVE DIAGNOSIS: suspected temporal arteritis, headache  POST-OPERATIVE DIAGNOSIS: Same as above  PROCEDURE: 1.   Right temporal artery biopsy  SURGEON: [name], MD  ASSISTANT(S): None  ANESTHESIA: MAC  ESTIMATED BLOOD LOSS: Minimal  FINDING(S): 1.  none  SPECIMEN(S):  Right  superficial temporal artery sent to pathology  INDICATIONS:   Patient is a 68 y.o. female who presents with increasing headaches as well as marked increase in inflammatory markers.  This has raised concern regarding temporal arteritis.. We were consulted by primary medical service for consideration for temporal artery biopsy. Risks and benefits were discussed and he was agreeable to proceed.  DESCRIPTION: After obtaining full informed written consent, the patient was brought back to the operating room and placed supine upon the operating table.  The patient received IV antibiotics prior to induction.  After obtaining adequate anesthesia, the patient was prepped and draped in the standard fashion. The area in front of his right ear was anesthetized copiously with a solution of 1% lidocaine and half percent Marcaine without epinephrine. I then made an incision just in front of the right ear overlying the palpable pulse. I then dissected down through the subcutaneous tissues and identified the superficial temporal artery. This was dissected out over a several centimeters and branches were ligated and divided between silk ties. Care was used to avoid electrocautery around the artery. I then clamped the artery proximally and distally and transected the artery. The specimen was then sent to pathology. The proximal and distal artery were ligated with 3-0 silk ties. Hemostasis was achieved. The wound was then closed with a series of interrupted 3-0 Vicryl's and the skin was closed with a 4-0 Monocryl. Sterile dressing was placed. The patient was taken to the recovery room in stable  condition having tolerated the procedure well.  COMPLICATIONS: None  CONDITION: Stable   [name] 07/14/2019 4:26 PM  This note was created with Dragon Medical transcription system. Any errors in dictation are purely unintentional.

## 2019-07-15 ENCOUNTER — Encounter: Payer: Self-pay | Admitting: Vascular Surgery

## 2019-07-15 NOTE — Anesthesia Postprocedure Evaluation (Signed)
Anesthesia Post Note  Patient: Sara [name]  Procedure(s) Performed: BIOPSY TEMPORAL ARTERY (Right Face)  Patient location during evaluation: PACU Anesthesia Type: General Level of consciousness: awake and alert and oriented Pain management: pain level controlled Vital Signs Assessment: post-procedure vital signs reviewed and stable Respiratory status: spontaneous breathing Cardiovascular status: blood pressure returned to baseline Anesthetic complications: no     Last Vitals:  Vitals:   07/14/19 1657 07/14/19 1715  BP: 119/73 116/60  Pulse: 99 99  Resp: 16 16  Temp:  36.6 C  SpO2: 95% 98%    Last Pain:  Vitals:   07/14/19 1715  TempSrc: Temporal  PainSc: 0-No pain                 Nevin Grizzle

## 2019-07-16 ENCOUNTER — Telehealth (INDEPENDENT_AMBULATORY_CARE_PROVIDER_SITE_OTHER): Payer: Self-pay | Admitting: Vascular Surgery

## 2019-07-16 DIAGNOSIS — Z79899 Other long term (current) drug therapy: Secondary | ICD-10-CM | POA: Insufficient documentation

## 2019-07-16 DIAGNOSIS — M316 Other giant cell arteritis: Secondary | ICD-10-CM | POA: Insufficient documentation

## 2019-07-16 LAB — SURGICAL PATHOLOGY

## 2019-07-16 NOTE — Telephone Encounter (Signed)
Called patient's cell phone and went to voicemail.  Patient identified herself on her voicemail message as Sara [name] and therefore I did leave a message.  Biopsies positive.  I have asked her to contact her primary care so that she can be referred to rheumatology.  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  No emergent situation is identified.

## 2019-07-29 ENCOUNTER — Telehealth (INDEPENDENT_AMBULATORY_CARE_PROVIDER_SITE_OTHER): Payer: Self-pay

## 2019-07-29 NOTE — Progress Notes (Signed)
MRN : [id number]  Sara [name] is a 68 y.o. (July 17, 1951) female who presents with chief complaint of No chief complaint on file. 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   05/30/2019:  1.  Percutaneous transluminal angioplasty and stent placement at 2 locations in the right femoral below-knee popliteal bypass        2. Percutaneous transluminal angioplasty and stent placement right tibioperoneal trunk         3. Mechanical thrombectomy using the penumbra CAT 6 right femoral popliteal bypass and tibioperoneal trunk and proximal peroneal.                    4.  4 compartment fasciotomies right calf  The patient notes improvement in the lower extremity symptoms. No interval shortening of the patient's claudication distance or rest pain symptoms. Previous wounds have now healed.  No new ulcers or wounds have occurred since the last visit.  There have been no significant changes to the patient's overall health care.  The patient denies amaurosis fugax or recent TIA symptoms. There are no recent neurological changes noted. The patient denies history of DVT, PE or superficial thrombophlebitis. The patient denies recent episodes of angina or shortness of breath.     No outpatient medications have been marked as taking for the 07/31/19 encounter (Appointment) with Delana Meyer, Dolores Lory, MD.    Past Medical History:  Diagnosis Date   Arthritis    right shoulder   Depression    GERD (gastroesophageal reflux disease)    History of blood clots    Hyperlipidemia    Hypertension    Mild mitral regurgitation    Osteoporosis    Stomach ulcer    Vascular disease    Sees Dr. Delana Meyer   Vertigo    Last episode approx Aug 2015   Wears dentures    full upper    Past Surgical History:    Procedure Laterality Date   ADENOIDECTOMY     ARTERY BIOPSY Right 07/14/2019   Procedure: BIOPSY TEMPORAL ARTERY;  Surgeon: [name], MD;  Location: [facility];  Service: Vascular;  Laterality: Right;   BREAST CYST ASPIRATION Left    CARDIAC CATHETERIZATION  02/15/2017   [facility]   COLONOSCOPY WITH PROPOFOL N/A 10/22/2017   Procedure: COLONOSCOPY WITH PROPOFOL;  Surgeon: Lucilla Lame, MD;  Location: [place];  Service: Endoscopy;  Laterality: N/A;  specimens not taken--pt on Plavix will be brought back in after 7 days off med   COLONOSCOPY WITH PROPOFOL N/A 11/05/2017   Procedure: COLONOSCOPY WITH PROPOFOL;  Surgeon: Lucilla Lame, MD;  Location: [place];  Service: Endoscopy;  Laterality: N/A;   ESOPHAGOGASTRODUODENOSCOPY N/A 12/21/2014   Procedure: ESOPHAGOGASTRODUODENOSCOPY (EGD);  Surgeon: Lucilla Lame, MD;  Location: [place];  Service: Gastroenterology;  Laterality: N/A;   ESOPHAGOGASTRODUODENOSCOPY (EGD) WITH PROPOFOL N/A 02/08/2016   Procedure: ESOPHAGOGASTRODUODENOSCOPY (EGD) WITH PROPOFOL;  Surgeon: Lucilla Lame, MD;  Location: [facility];  Service: Endoscopy;  Laterality: N/A;   FASCIOTOMY Right 05/30/2019   Procedure: FASCIOTOMY;  Surgeon: [name], MD;  Location: [facility];  Service: Vascular;  Laterality: Right;   FASCIOTOMY CLOSURE Right 06/04/2019   Procedure: FASCIOTOMY CLOSURE;  Surgeon: [name], MD;  Location: [facility];  Service: Vascular;  Laterality: Right;   HEMORROIDECTOMY  2014  LOWER EXTREMITY ANGIOGRAPHY Right 05/30/2019   Procedure: LOWER EXTREMITY ANGIOGRAPHY;  Surgeon: [name], MD;  Location: [place] [facility];  Service: Cardiovascular;  Laterality: Right;   PERIPHERAL VASCULAR CATHETERIZATION N/A 02/09/2016   Procedure: Abdominal Aortogram w/Lower Extremity;  Surgeon: [name], MD;  Location: [place] [facility];  Service: Cardiovascular;  Laterality: N/A;   PERIPHERAL  VASCULAR CATHETERIZATION Right 02/10/2016   Procedure: Lower Extremity Angiography;  Surgeon: [name], MD;  Location: [place] [facility];  Service: Cardiovascular;  Laterality: Right;   POLYPECTOMY  11/05/2017   Procedure: POLYPECTOMY;  Surgeon: Lucilla Lame, MD;  Location: [place];  Service: Endoscopy;;   TONSILLECTOMY     VASCULAR SURGERY  [phone]   Fem-Pop Bypass    Social History Social History   Tobacco Use   Smoking status: Former Smoker    Packs/day: 2.00    Years: 25.00    Pack years: 50.00    Types: Cigarettes    Quit date: 08/22/1991    Years since quitting: 27.9   Smokeless tobacco: Never Used  Substance Use Topics   Alcohol use: No    Alcohol/week: 0.0 standard drinks   Drug use: Never    Family History Family History  Problem Relation Age of Onset   CVA Mother    Heart attack Mother    Cancer Father        colon cancer   Colon cancer Father    Heart disease Maternal Uncle    Breast cancer Neg Hx     Allergies  Allergen Reactions   Amoxicillin Other (See Comments)    Yeast infection   Oxycodone Itching   Metoprolol Tartrate Rash   Vicodin [Hydrocodone-Acetaminophen] Hives and Rash    Flushing     REVIEW OF SYSTEMS (Negative unless checked)  Constitutional: [] Weight loss  [] Fever  [] Chills Cardiac: [] Chest pain   [] Chest pressure   [] Palpitations   [] Shortness of breath when laying flat   [] Shortness of breath with exertion. Vascular:  [] Pain in legs with walking   [] Pain in legs at rest  [] History of DVT   [] Phlebitis   [] Swelling in legs   [] Varicose veins   [] Non-healing ulcers Pulmonary:   [] Uses home oxygen   [] Productive cough   [] Hemoptysis   [] Wheeze  [] COPD   [] Asthma Neurologic:  [] Dizziness   [] Seizures   [] History of stroke   [] History of TIA  [] Aphasia   [] Vissual changes   [] Weakness or numbness in arm   [] Weakness or numbness in leg Musculoskeletal:   [] Joint swelling   [] Joint pain   [] Low back  pain Hematologic:  [] Easy bruising  [] Easy bleeding   [] Hypercoagulable state   [] Anemic Gastrointestinal:  [] Diarrhea   [] Vomiting  [] Gastroesophageal reflux/heartburn   [] Difficulty swallowing. Genitourinary:  [] Chronic kidney disease   [] Difficult urination  [] Frequent urination   [] Blood in urine Skin:  [] Rashes   [] Ulcers  Psychological:  [] History of anxiety   []  History of major depression.  Physical Examination  There were no vitals filed for this visit. There is no height or weight on file to calculate BMI. Gen: WD/WN, NAD Head: [place]/AT, No temporalis wasting.  Ear/Nose/Throat: Hearing grossly intact, nares w/o erythema or drainage Eyes: PER, EOMI, sclera nonicteric.  Neck: Supple, no large masses.   Pulmonary:  Good air movement, no audible wheezing bilaterally, no use of accessory muscles.  Cardiac: RRR, no JVD Vascular: right temp artery biopsy incision CD&I Vessel Right Left  Radial  Palpable Palpable  PT Trace Palpable Not Palpable  DP Not Palpable Not Palpable  Gastrointestinal: Non-distended. No guarding/no peritoneal signs.  Musculoskeletal: M/S 5/5 throughout.  No deformity or atrophy.  Neurologic: CN 2-12 intact. Symmetrical.  Speech is fluent. Motor exam as listed above. Psychiatric: Judgment intact, Mood & affect appropriate for pt's clinical situation. Dermatologic: No rashes or ulcers noted.  No changes consistent with cellulitis. Lymph : No lichenification or skin changes of chronic lymphedema.  CBC Lab Results  Component Value Date   WBC 7.3 07/03/2019   HGB 11.9 (L) 07/03/2019   HCT 36.7 07/03/2019   MCV 88.2 07/03/2019   PLT 263 07/03/2019    BMET    Component Value Date/Time   NA 137 07/03/2019 1442   NA 139 09/21/2015 0916   K 3.9 07/03/2019 1442   CL 102 07/03/2019 1442   CO2 25 07/03/2019 1442   GLUCOSE 96 07/03/2019 1442   BUN 11 07/03/2019 1442   BUN 12 09/21/2015 0916   CREATININE 0.69 07/03/2019 1442   CREATININE 0.88 05/16/2016 1034    CALCIUM 9.0 07/03/2019 1442   GFRNONAA >60 07/03/2019 1442   GFRNONAA 66 05/01/2016 0846   GFRAA >60 07/03/2019 1442   GFRAA 76 05/01/2016 0846   CrCl cannot be calculated (Patient's most recent lab result is older than the maximum 21 days allowed.).  COAG Lab Results  Component Value Date   INR 1.0 07/11/2019   INR 1.0 06/04/2019   INR 2.2 (H) 06/01/2019    Radiology Vas [place] Burnard Bunting With/wo Tbi  Result Date: 07/24/2019 LOWER EXTREMITY DOPPLER STUDY Indications: Rest pain, and peripheral artery disease. High Risk Factors: Hypertension.  Vascular Interventions: 02/10/2016 PTA of right femoral to popliteal autogenous                         bypass graft. PTA right distal posterior tibial artery.                         12/2008 and 02/1990 Right femoral to popliteal autogenous                         bypass graft.                          05/30/2019: PTA and Stent placement at 2 locations in                         the Femoral below knee poplteal bypass. Mechanical                         Thrombectomy Right Popliteal bypass and TibioPeroneal                         trunk and Proximal Peroneal Artery. Infusion of TPA to                         the Right Femoral-Popliteal Bypass.                          05/30/2019: Compartment Syndrome Right Lower Extremity                         Status Post Revascularization.  06/04/2019: Compartment Syndrome Status Post                         Fasciotomies. Comparison Study: 09/04/2018 Performing Technologist: Almira Coaster RVS  Examination Guidelines: A complete evaluation includes at minimum, Doppler waveform signals and systolic blood pressure reading at the level of bilateral brachial, anterior tibial, and posterior tibial arteries, when vessel segments are accessible. Bilateral testing is considered an integral part of a complete examination. Photoelectric Plethysmograph (PPG) waveforms and toe systolic pressure readings are included as  required and additional duplex testing as needed. Limited examinations for reoccurring indications may be performed as noted.  ABI Findings: +---------+------------------+-----+--------+--------+  Right     Rt Pressure (mmHg) Index Waveform Comment   +---------+------------------+-----+--------+--------+  Brachial  135                                         +---------+------------------+-----+--------+--------+  ATA       123                0.85  biphasic           +---------+------------------+-----+--------+--------+  PTA       142                0.99  biphasic           +---------+------------------+-----+--------+--------+  Great Toe 127                0.88  Normal             +---------+------------------+-----+--------+--------+ +---------+------------------+-----+---------+-------+  Left      Lt Pressure (mmHg) Index Waveform  Comment  +---------+------------------+-----+---------+-------+  Brachial  144                                         +---------+------------------+-----+---------+-------+  ATA       159                1.10  triphasic          +---------+------------------+-----+---------+-------+  PTA       153                1.06  triphasic          +---------+------------------+-----+---------+-------+  Great Toe 122                0.85  Normal             +---------+------------------+-----+---------+-------+ +-------+-----------+-----------+------------+------------+  ABI/TBI Today's ABI Today's TBI Previous ABI Previous TBI  +-------+-----------+-----------+------------+------------+  Right   .99         .88         1.02         .72           +-------+-----------+-----------+------------+------------+  Left    1.10        .85         .99          .75           +-------+-----------+-----------+------------+------------+ Bilateral TBIs appear increased compared to prior study on 09/04/2018. Bilateral ABIs appear essentially unchanged compared to prior study on 09/04/2018.  Summary: Right: Resting  right ankle-brachial index is within normal range. No evidence of significant right lower extremity arterial  disease. The right toe-brachial index is normal. Left: Resting left ankle-brachial index is within normal range. No evidence of significant left lower extremity arterial disease. The left toe-brachial index is normal.  *See table(s) above for measurements and observations.  Electronically signed by [name] MD on 07/24/2019 at 4:17:13 PM.    Final    Vas US Carotid  Result Date: 07/24/2019 Carotid Arterial Duplex Study Indications: Carotid artery disease. Performing Technologist: Almira Coaster RVS  Examination Guidelines: A complete evaluation includes B-mode imaging, spectral Doppler, color Doppler, and power Doppler as needed of all accessible portions of each vessel. Bilateral testing is considered an integral part of a complete examination. Limited examinations for reoccurring indications may be performed as noted.  Right Carotid Findings: +----------+--------+--------+--------+------------------+--------+             PSV cm/s EDV cm/s Stenosis Plaque Description Comments  +----------+--------+--------+--------+------------------+--------+  CCA Prox   57       10                                             +----------+--------+--------+--------+------------------+--------+  CCA Mid    50       11                                             +----------+--------+--------+--------+------------------+--------+  CCA Distal 50       12                                             +----------+--------+--------+--------+------------------+--------+  ICA Prox   87       19                                             +----------+--------+--------+--------+------------------+--------+  ICA Mid    57       19                                             +----------+--------+--------+--------+------------------+--------+  ICA Distal 61       22                                              +----------+--------+--------+--------+------------------+--------+  ECA        54       6                                              +----------+--------+--------+--------+------------------+--------+ +----------+--------+-------+--------+-------------------+             PSV cm/s EDV cms Describe Arm Pressure (mmHG)  +----------+--------+-------+--------+-------------------+  Subclavian 80       0                                     +----------+--------+-------+--------+-------------------+ +---------+--------+--+--------+-+  Vertebral PSV cm/s 22 EDV cm/s 6  +---------+--------+--+--------+-+  Left Carotid Findings: +----------+--------+--------+--------+------------------+--------+             PSV cm/s EDV cm/s Stenosis Plaque Description Comments  +----------+--------+--------+--------+------------------+--------+  CCA Prox   58       9                                              +----------+--------+--------+--------+------------------+--------+  CCA Mid    52       11                                             +----------+--------+--------+--------+------------------+--------+  CCA Distal 53       15                                             +----------+--------+--------+--------+------------------+--------+  ICA Prox   30       12                                             +----------+--------+--------+--------+------------------+--------+  ICA Mid    35       12                                             +----------+--------+--------+--------+------------------+--------+  ICA Distal 65       23                                             +----------+--------+--------+--------+------------------+--------+  ECA        43       7                                              +----------+--------+--------+--------+------------------+--------+ +----------+--------+--------+--------+-------------------+             PSV cm/s EDV cm/s Describe Arm Pressure (mmHG)   +----------+--------+--------+--------+-------------------+  Subclavian 112      14                                     +----------+--------+--------+--------+-------------------+ +---------+--------+--+--------+--+  Vertebral PSV cm/s 50 EDV cm/s 13  +---------+--------+--+--------+--+  Summary: Right Carotid: Velocities in the right ICA are consistent with a 1-39% stenosis. Left Carotid: Velocities in the left ICA are consistent with a 1-39% stenosis. Vertebrals:  Bilateral vertebral arteries demonstrate antegrade flow. Subclavians: Normal flow hemodynamics were seen in bilateral subclavian              arteries. *See table(s) above for measurements and observations.  Electronically signed by [name] MD  on 07/24/2019 at 4:17:08 PM.    Final    Vas [place] Lower Extremity Arterial Duplex  Result Date: 07/24/2019 LOWER EXTREMITY ARTERIAL DUPLEX STUDY Indications: Peripheral artery disease. High Risk Factors: Hypertension.  Vascular Interventions: 02/10/2016 PTA of right femoral to popliteal autogenous                         bypass graft. PTA right distal posterior tibial artery.                         12/2008 and 02/1990 Right femoral to popliteal autogenous                         bypass graft.                          05/30/2019: PTA and Stent placement at 2 locations in                         the Femoral below knee poplteal bypass. Mechanical                         Thrombectomy Right Popliteal bypass and TibioPeroneal                         trunk and Proximal Peroneal Artery. Infusion of TPA to                         the Right Femoral-Popliteal Bypass.                          05/30/2019: Compartment Syndrome Right Lower Extremity                         Status Post Revascularization.                          06/04/2019: Compartment Syndrome Status Post                         Fasciotomies. Current ABI:            Rt .99, Lt 1.10 Comparison Study: 07/08/2018 Performing Technologist: Almira Coaster RVS   Examination Guidelines: A complete evaluation includes B-mode imaging, spectral Doppler, color Doppler, and power Doppler as needed of all accessible portions of each vessel. Bilateral testing is considered an integral part of a complete examination. Limited examinations for reoccurring indications may be performed as noted.  +-----------+--------+-----+--------+----------+--------+  RIGHT       PSV cm/s Ratio Stenosis Waveform   Comments  +-----------+--------+-----+--------+----------+--------+  CFA Distal  110                     biphasic             +-----------+--------+-----+--------+----------+--------+  DFA         55                      biphasic             +-----------+--------+-----+--------+----------+--------+  SFA Prox    53  biphasic             +-----------+--------+-----+--------+----------+--------+  SFA Mid     43                      biphasic             +-----------+--------+-----+--------+----------+--------+  SFA Distal  0                       Occluded             +-----------+--------+-----+--------+----------+--------+  ATA Distal  23                      monophasic           +-----------+--------+-----+--------+----------+--------+  PTA Distal  65                      biphasic             +-----------+--------+-----+--------+----------+--------+  PERO Distal 25                      monophasic           +-----------+--------+-----+--------+----------+--------+  Right Graft #1: +------------------+--------+--------+---------+--------+                     PSV cm/s Stenosis Waveform  Comments  +------------------+--------+--------+---------+--------+  Inflow                                                   +------------------+--------+--------+---------+--------+  Prox Anastomosis   117               biphasic            +------------------+--------+--------+---------+--------+  Proximal Graft     98                biphasic             +------------------+--------+--------+---------+--------+  Mid Graft          82                triphasic           +------------------+--------+--------+---------+--------+  Distal Graft       40                triphasic           +------------------+--------+--------+---------+--------+  Distal Anastomosis 50                triphasic           +------------------+--------+--------+---------+--------+  Outflow                                                  +------------------+--------+--------+---------+--------+   Summary: Right: The Right Lower Extremity Bypass Graft appears to be patent throughout.  See table(s) above for measurements and observations. Electronically signed by [name] MD on 07/24/2019 at 4:17:06 PM.    Final      Assessment/Plan 1. Peripheral arterial occlusive disease ([employer]) Recommend:  The patient is status post successful angiogram with intervention.  The patient reports that the claudication symptoms and leg  pain is essentially gone.   The patient denies lifestyle limiting changes at this point in time.  No further invasive studies, angiography or surgery at this time The patient should continue walking and begin a more formal exercise program.  The patient should continue antiplatelet therapy and aggressive treatment of the lipid abnormalities  Smoking cessation was again discussed  The patient should continue wearing graduated compression socks 10-15 mmHg strength to control the mild edema.  Patient should undergo noninvasive studies as ordered. The patient will follow up with me after the studies.   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 Bilateral carotid artery stenosis Recommend:  Given the patient's asymptomatic subcritical stenosis no further invasive testing or surgery at this time.  Duplex ultrasound shows <60%  stenosis bilaterally.  Continue  antiplatelet therapy as prescribed Continue management of CAD, HTN and Hyperlipidemia Healthy heart diet,  encouraged exercise at least 4 times per week Follow up in 12 months with duplex ultrasound and physical exam   4. Chronic venous insufficiency No surgery or intervention at this point in time.    I have had a long discussion with the patient regarding venous insufficiency and why it  causes symptoms. I have discussed with the patient the chronic skin changes that accompany venous insufficiency and the long term sequela such as infection and ulceration.  Patient will begin wearing graduated compression stockings class 1 (20-30 mmHg) or compression wraps on a daily basis a prescription was given. The patient will put the stockings on first thing in the morning and removing them in the evening. The patient is instructed specifically not to sleep in the stockings.    In addition, behavioral modification including several periods of elevation of the lower extremities during the day will be continued. I have demonstrated that proper elevation is a position with the ankles at heart level.  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  Continue statin as ordered and reviewed, no changes at this time  Nitrates PRN for chest pain   6. Essential hypertension Continue antihypertensive medications as already ordered, these medications have been reviewed and there are no changes at this time.   7. Other hyperlipidemia Continue statin as ordered and reviewed, no changes at this time     [name], MD  07/29/2019 1:01 PM

## 2019-07-30 ENCOUNTER — Other Ambulatory Visit (INDEPENDENT_AMBULATORY_CARE_PROVIDER_SITE_OTHER): Payer: Self-pay | Admitting: Nurse Practitioner

## 2019-07-30 MED ORDER — APIXABAN 5 MG PO TABS: 5.0000 mg | ORAL_TABLET | Freq: Two times a day (BID) | ORAL | 6 refills | Status: DC

## 2019-07-30 NOTE — Telephone Encounter (Signed)
sent 

## 2019-07-30 NOTE — Telephone Encounter (Signed)
Patient has been made aware medication was sent to pharmacy

## 2019-07-31 ENCOUNTER — Ambulatory Visit (INDEPENDENT_AMBULATORY_CARE_PROVIDER_SITE_OTHER): Payer: Medicare Other | Admitting: Vascular Surgery

## 2019-07-31 ENCOUNTER — Encounter (INDEPENDENT_AMBULATORY_CARE_PROVIDER_SITE_OTHER): Payer: Self-pay | Admitting: Vascular Surgery

## 2019-07-31 ENCOUNTER — Other Ambulatory Visit: Payer: Self-pay

## 2019-07-31 VITALS — BP 125/73 | HR 103 | Resp 16 | Wt 124.8 lb

## 2019-07-31 DIAGNOSIS — I25119 Atherosclerotic heart disease of native coronary artery with unspecified angina pectoris: Secondary | ICD-10-CM | POA: Diagnosis not present

## 2019-07-31 DIAGNOSIS — E7849 Other hyperlipidemia: Secondary | ICD-10-CM

## 2019-07-31 DIAGNOSIS — I6523 Occlusion and stenosis of bilateral carotid arteries: Secondary | ICD-10-CM | POA: Diagnosis not present

## 2019-07-31 DIAGNOSIS — I10 Essential (primary) hypertension: Secondary | ICD-10-CM

## 2019-07-31 DIAGNOSIS — I872 Venous insufficiency (chronic) (peripheral): Secondary | ICD-10-CM

## 2019-07-31 DIAGNOSIS — M316 Other giant cell arteritis: Secondary | ICD-10-CM | POA: Diagnosis not present

## 2019-07-31 DIAGNOSIS — I779 Disorder of arteries and arterioles, unspecified: Secondary | ICD-10-CM | POA: Diagnosis not present

## 2019-07-31 MED ORDER — APIXABAN 5 MG PO TABS: 5.0000 mg | ORAL_TABLET | Freq: Two times a day (BID) | ORAL | 4 refills | Status: DC

## 2019-08-20 ENCOUNTER — Encounter (INDEPENDENT_AMBULATORY_CARE_PROVIDER_SITE_OTHER): Payer: Self-pay | Admitting: Vascular Surgery

## 2019-08-21 ENCOUNTER — Encounter: Payer: Self-pay | Admitting: *Deleted

## 2019-09-03 ENCOUNTER — Other Ambulatory Visit (INDEPENDENT_AMBULATORY_CARE_PROVIDER_SITE_OTHER): Payer: Self-pay | Admitting: Nurse Practitioner

## 2019-09-03 DIAGNOSIS — I779 Disorder of arteries and arterioles, unspecified: Secondary | ICD-10-CM

## 2019-09-03 MED ORDER — APIXABAN 5 MG PO TABS: 5.0000 mg | ORAL_TABLET | Freq: Two times a day (BID) | ORAL | 0 refills | Status: DC

## 2019-09-23 ENCOUNTER — Other Ambulatory Visit: Payer: Self-pay | Admitting: Nurse Practitioner

## 2019-09-23 DIAGNOSIS — Z1231 Encounter for screening mammogram for malignant neoplasm of breast: Secondary | ICD-10-CM

## 2019-10-02 ENCOUNTER — Other Ambulatory Visit: Payer: Self-pay | Admitting: Gastroenterology

## 2019-10-13 ENCOUNTER — Ambulatory Visit (INDEPENDENT_AMBULATORY_CARE_PROVIDER_SITE_OTHER): Payer: Medicare Other

## 2019-10-13 ENCOUNTER — Ambulatory Visit (INDEPENDENT_AMBULATORY_CARE_PROVIDER_SITE_OTHER): Payer: Medicare Other | Admitting: Vascular Surgery

## 2019-10-13 ENCOUNTER — Encounter (INDEPENDENT_AMBULATORY_CARE_PROVIDER_SITE_OTHER): Payer: Self-pay | Admitting: Vascular Surgery

## 2019-10-13 ENCOUNTER — Other Ambulatory Visit: Payer: Self-pay

## 2019-10-13 VITALS — BP 132/87 | HR 95 | Resp 16 | Ht 63.0 in | Wt 130.0 lb

## 2019-10-13 DIAGNOSIS — I779 Disorder of arteries and arterioles, unspecified: Secondary | ICD-10-CM

## 2019-10-13 DIAGNOSIS — I6523 Occlusion and stenosis of bilateral carotid arteries: Secondary | ICD-10-CM

## 2019-10-13 DIAGNOSIS — M316 Other giant cell arteritis: Secondary | ICD-10-CM | POA: Diagnosis not present

## 2019-10-13 DIAGNOSIS — I70213 Atherosclerosis of native arteries of extremities with intermittent claudication, bilateral legs: Secondary | ICD-10-CM | POA: Diagnosis not present

## 2019-10-13 DIAGNOSIS — I10 Essential (primary) hypertension: Secondary | ICD-10-CM

## 2019-10-13 DIAGNOSIS — E7849 Other hyperlipidemia: Secondary | ICD-10-CM

## 2019-10-13 NOTE — Progress Notes (Signed)
MRN : [id number]  Sara [name] is a 69 y.o. ([birth year]) female who presents with chief complaint of No chief complaint on file. 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   05/30/2019:  1.  Percutaneous transluminal angioplastyand stent placement at 2 locations in the right femoral below-knee popliteal bypass        2. Percutaneous transluminal angioplastyand stent placement right tibioperoneal trunk         3. Mechanical thrombectomy using the penumbra CAT 6 right femoral popliteal bypass and tibioperoneal trunk and proximal peroneal.                    4.  4 compartment fasciotomies right calf  The patient notes improvement in the lower extremity symptoms. No interval shortening of the patient's claudication distance or rest pain symptoms. Previous wounds have now healed.  No new ulcers or wounds have occurred since the last visit.  There have been no significant changes to the patient's overall health care.  The patient denies amaurosis fugax or recent TIA symptoms. There are no recent neurological changes noted. The patient denies history of DVT, PE or superficial thrombophlebitis. The patient denies recent episodes of angina or shortness of breath.   No outpatient medications have been marked as taking for the 10/13/19 encounter (Appointment) with Delana Meyer, Dolores Lory, MD.    Past Medical History:  Diagnosis Date  . Arthritis    right shoulder  . Depression   . GERD (gastroesophageal reflux disease)   . History of blood clots   . Hyperlipidemia   . Hypertension   . Mild mitral regurgitation   . Osteoporosis   . Stomach ulcer   . Vascular disease    Sees Dr. Delana Meyer  . Vertigo    Last episode approx Aug 2015  . Wears dentures    full upper    Past Surgical History:   Procedure Laterality Date  . ADENOIDECTOMY    . ARTERY BIOPSY Right 07/14/2019   Procedure: BIOPSY TEMPORAL ARTERY;  Surgeon: [name], MD;  Location: [facility];  Service: Vascular;  Laterality: Right;  . BREAST CYST ASPIRATION Left   . CARDIAC CATHETERIZATION  02/15/2017   [facility]  . COLONOSCOPY WITH PROPOFOL N/A 10/22/2017   Procedure: COLONOSCOPY WITH PROPOFOL;  Surgeon: Lucilla Lame, MD;  Location: [place];  Service: Endoscopy;  Laterality: N/A;  specimens not taken--pt on Plavix will be brought back in after 7 days off med  . COLONOSCOPY WITH PROPOFOL N/A 11/05/2017   Procedure: COLONOSCOPY WITH PROPOFOL;  Surgeon: Lucilla Lame, MD;  Location: [place];  Service: Endoscopy;  Laterality: N/A;  . ESOPHAGOGASTRODUODENOSCOPY N/A 12/21/2014   Procedure: ESOPHAGOGASTRODUODENOSCOPY (EGD);  Surgeon: Lucilla Lame, MD;  Location: [place];  Service: Gastroenterology;  Laterality: N/A;  . ESOPHAGOGASTRODUODENOSCOPY (EGD) WITH PROPOFOL N/A 02/08/2016   Procedure: ESOPHAGOGASTRODUODENOSCOPY (EGD) WITH PROPOFOL;  Surgeon: Lucilla Lame, MD;  Location: [facility];  Service: Endoscopy;  Laterality: N/A;  . FASCIOTOMY Right 05/30/2019   Procedure: FASCIOTOMY;  Surgeon: [name], MD;  Location: [facility];  Service: Vascular;  Laterality: Right;  . FASCIOTOMY CLOSURE Right 06/04/2019   Procedure: FASCIOTOMY CLOSURE;  Surgeon: [name], MD;  Location: [facility];  Service: Vascular;  Laterality: Right;  . HEMORROIDECTOMY  2014  . LOWER EXTREMITY ANGIOGRAPHY Right  05/30/2019   Procedure: LOWER EXTREMITY ANGIOGRAPHY;  Surgeon: [name], MD;  Location: [place] [facility];  Service: Cardiovascular;  Laterality: Right;  . PERIPHERAL VASCULAR CATHETERIZATION N/A 02/09/2016   Procedure: Abdominal Aortogram w/Lower Extremity;  Surgeon: [name], MD;  Location: [place] [facility];  Service: Cardiovascular;  Laterality: N/A;  . PERIPHERAL  VASCULAR CATHETERIZATION Right 02/10/2016   Procedure: Lower Extremity Angiography;  Surgeon: [name], MD;  Location: [place] [facility];  Service: Cardiovascular;  Laterality: Right;  . POLYPECTOMY  11/05/2017   Procedure: POLYPECTOMY;  Surgeon: Lucilla Lame, MD;  Location: [place];  Service: Endoscopy;;  . TONSILLECTOMY    . VASCULAR SURGERY  [id number]   Fem-Pop Bypass    Social History Social History   Tobacco Use  . Smoking status: Former Smoker    Packs/day: 2.00    Years: 25.00    Pack years: 50.00    Types: Cigarettes    Quit date: 08/22/1991    Years since quitting: 28.1  . Smokeless tobacco: Never Used  Substance Use Topics  . Alcohol use: No    Alcohol/week: 0.0 standard drinks  . Drug use: Never    Family History Family History  Problem Relation Age of Onset  . CVA Mother   . Heart attack Mother   . Cancer Father        colon cancer  . Colon cancer Father   . Heart disease Maternal Uncle   . Breast cancer Neg Hx     Allergies  Allergen Reactions  . Amoxicillin Other (See Comments)    Yeast infection  . Oxycodone Itching  . Metoprolol Tartrate Rash  . Vicodin [Hydrocodone-Acetaminophen] Hives and Rash    Flushing     REVIEW OF SYSTEMS (Negative unless checked)  Constitutional: [] Weight loss  [] Fever  [] Chills Cardiac: [] Chest pain   [] Chest pressure   [] Palpitations   [] Shortness of breath when laying flat   [] Shortness of breath with exertion. Vascular:  [x] Pain in legs with walking   [] Pain in legs at rest  [] History of DVT   [] Phlebitis   [] Swelling in legs   [] Varicose veins   [] Non-healing ulcers Pulmonary:   [] Uses home oxygen   [] Productive cough   [] Hemoptysis   [] Wheeze  [] COPD   [] Asthma Neurologic:  [] Dizziness   [] Seizures   [] History of stroke   [] History of TIA  [] Aphasia   [] Vissual changes   [] Weakness or numbness in arm   [] Weakness or numbness in leg Musculoskeletal:   [] Joint swelling   [] Joint pain   [] Low back  pain Hematologic:  [] Easy bruising  [] Easy bleeding   [] Hypercoagulable state   [] Anemic Gastrointestinal:  [] Diarrhea   [] Vomiting  [] Gastroesophageal reflux/heartburn   [] Difficulty swallowing. Genitourinary:  [] Chronic kidney disease   [] Difficult urination  [] Frequent urination   [] Blood in urine Skin:  [] Rashes   [] Ulcers  Psychological:  [] History of anxiety   []  History of major depression.  Physical Examination  There were no vitals filed for this visit. There is no height or weight on file to calculate BMI. Gen: WD/WN, NAD Head: [place]/AT, No temporalis wasting.  Ear/Nose/Throat: Hearing grossly intact, nares w/o erythema or drainage Eyes: PER, EOMI, sclera nonicteric.  Neck: Supple, no large masses.   Pulmonary:  Good air movement, no audible wheezing bilaterally, no use of accessory muscles.  Cardiac: RRR, no JVD Vascular:  Vessel Right Left  Radial Palpable Palpable  PT Trace Palpable 1+ Palpable  DP Not Palpable Not Palpable  Gastrointestinal: Non-distended. No guarding/no peritoneal signs.  Musculoskeletal: M/S 5/5 throughout.  No deformity or atrophy.  Neurologic: CN 2-12 intact. Symmetrical.  Speech is fluent. Motor exam as listed above. Psychiatric: Judgment intact, Mood & affect appropriate for pt's clinical situation. Dermatologic: No rashes or ulcers noted.  No changes consistent with cellulitis.  CBC Lab Results  Component Value Date   WBC 7.3 07/03/2019   HGB 11.9 (L) 07/03/2019   HCT 36.7 07/03/2019   MCV 88.2 07/03/2019   PLT 263 07/03/2019    BMET    Component Value Date/Time   NA 137 07/03/2019 1442   NA 139 09/21/2015 0916   K 3.9 07/03/2019 1442   CL 102 07/03/2019 1442   CO2 25 07/03/2019 1442   GLUCOSE 96 07/03/2019 1442   BUN 11 07/03/2019 1442   BUN 12 09/21/2015 0916   CREATININE 0.69 07/03/2019 1442   CREATININE 0.88 05/16/2016 1034   CALCIUM 9.0 07/03/2019 1442   GFRNONAA >60 07/03/2019 1442   GFRNONAA 66 05/01/2016 0846   GFRAA  >60 07/03/2019 1442   GFRAA 76 05/01/2016 0846   CrCl cannot be calculated (Patient's most recent lab result is older than the maximum 21 days allowed.).  COAG Lab Results  Component Value Date   INR 1.0 07/11/2019   INR 1.0 06/04/2019   INR 2.2 (H) 06/01/2019    Radiology No results found.   Assessment/Plan 1. Atherosclerosis of native artery of both lower extremities with intermittent claudication (HCC)  Recommend:  The patient has evidence of atherosclerosis of the lower extremities with claudication.  The patient does not voice lifestyle limiting changes at this point in time.  Noninvasive studies do not suggest clinically significant change.  No invasive studies, angiography or surgery at this time The patient should continue walking and begin a more formal exercise program.  The patient should continue antiplatelet therapy and aggressive treatment of the lipid abnormalities  No changes in the patient's medications at this time  The patient should continue wearing graduated compression socks 10-15 mmHg strength to control the mild edema.   - VAS [place] ABI WITH/WO TBI; Future - VAS [place] LOWER EXTREMITY ARTERIAL DUPLEX; Future  2. Temporal arteritis (HCC) Continue prednisone  3. Bilateral carotid artery stenosis Recommend:  Given the patient's asymptomatic subcritical stenosis no further invasive testing or surgery at this time.  Continue antiplatelet therapy as prescribed Continue management of CAD, HTN and Hyperlipidemia Healthy heart diet,  encouraged exercise at least 4 times per week  4. Essential hypertension Continue antihypertensive medications as already ordered, these medications have been reviewed and there are no changes at this time.   5. Other hyperlipidemia Continue statin as ordered and reviewed, no changes at this time     [name], MD  10/13/2019 10:17 AM

## 2019-10-16 ENCOUNTER — Ambulatory Visit: Payer: Medicare Other | Attending: Internal Medicine

## 2019-10-16 ENCOUNTER — Encounter (INDEPENDENT_AMBULATORY_CARE_PROVIDER_SITE_OTHER): Payer: Self-pay | Admitting: Vascular Surgery

## 2019-10-16 DIAGNOSIS — Z23 Encounter for immunization: Secondary | ICD-10-CM | POA: Insufficient documentation

## 2019-10-16 NOTE — Progress Notes (Signed)
   Covid-19 Vaccination Clinic  Name:  Sara [name]    MRN: [id number] DOB: 1950/12/14  10/16/2019  [name] was observed post Covid-19 immunization for 15 minutes without incidence. She was provided with Vaccine Information Sheet and instruction to access the V-Safe system.   [name] was instructed to call 911 with any severe reactions post vaccine: Marland Kitchen Difficulty breathing  . Swelling of your face and throat  . A fast heartbeat  . A bad rash all over your body  . Dizziness and weakness    Immunizations Administered    Name Date Dose VIS Date Route   Pfizer COVID-19 Vaccine 10/16/2019 11:42 AM 0.3 mL 08/01/2019 Intramuscular   Manufacturer: [place]   Lot: [id number]   [place]: [id number]

## 2019-10-30 ENCOUNTER — Ambulatory Visit
Admission: RE | Admit: 2019-10-30 | Discharge: 2019-10-30 | Disposition: A | Discharge status: Home / Self Care | Payer: Medicare Other | Source: Ambulatory Visit | Attending: Nurse Practitioner | Admitting: Nurse Practitioner

## 2019-10-30 DIAGNOSIS — Z1231 Encounter for screening mammogram for malignant neoplasm of breast: Secondary | ICD-10-CM | POA: Diagnosis not present

## 2019-11-12 ENCOUNTER — Ambulatory Visit: Payer: Medicare Other | Attending: Internal Medicine

## 2019-11-12 DIAGNOSIS — Z23 Encounter for immunization: Secondary | ICD-10-CM

## 2019-11-12 NOTE — Progress Notes (Signed)
   Covid-19 Vaccination Clinic  Name:  Kennadie Hosford    MRN: [id number] DOB: [birth year]  11/12/2019  Ms. Border was observed post Covid-19 immunization for 15 minutes without incident. She was provided with Vaccine Information Sheet and instruction to access the V-Safe system.   Ms. Itkin was instructed to call 911 with any severe reactions post vaccine: Marland Kitchen Difficulty breathing  . Swelling of face and throat  . A fast heartbeat  . A bad rash all over body  . Dizziness and weakness   Immunizations Administered    Name Date Dose VIS Date Route   Pfizer COVID-19 Vaccine 11/12/2019 10:22 AM 0.3 mL 08/01/2019 Intramuscular   Manufacturer: [employer]   Lot: [id number]   [place]: [id number]

## 2019-11-25 DIAGNOSIS — G8929 Other chronic pain: Secondary | ICD-10-CM | POA: Insufficient documentation

## 2019-12-02 ENCOUNTER — Telehealth: Payer: Self-pay

## 2019-12-02 NOTE — Telephone Encounter (Signed)
Patient left voicemail saying she's having extreme stomach issues and would like to be seen this week. I will call to schedule. I only have access to schedule for June.

## 2019-12-03 NOTE — Telephone Encounter (Signed)
Spoke with pt regarding scheduling an appt with Dr. Allen Norris. Pt stated she has been on Prednisone since November. She contacted her doctor that prescribed it and was told to stop taking it. Pt stated she woke up yesterday morning and was feeling better. She will continue to see how she is feeling and will call if pain returns.

## 2019-12-23 ENCOUNTER — Other Ambulatory Visit: Payer: Self-pay

## 2019-12-23 ENCOUNTER — Ambulatory Visit (INDEPENDENT_AMBULATORY_CARE_PROVIDER_SITE_OTHER): Payer: Medicare Other | Admitting: Gastroenterology

## 2019-12-23 ENCOUNTER — Encounter: Payer: Self-pay | Admitting: Gastroenterology

## 2019-12-23 VITALS — BP 102/61 | HR 114 | Temp 98.9°F | Ht 63.0 in | Wt 127.8 lb

## 2019-12-23 DIAGNOSIS — K219 Gastro-esophageal reflux disease without esophagitis: Secondary | ICD-10-CM

## 2019-12-23 DIAGNOSIS — I779 Disorder of arteries and arterioles, unspecified: Secondary | ICD-10-CM

## 2019-12-23 MED ORDER — PANTOPRAZOLE SODIUM 40 MG PO TBEC: 40.0000 mg | DELAYED_RELEASE_TABLET | Freq: Every day | ORAL | 1 refills | Status: DC

## 2019-12-23 NOTE — Progress Notes (Signed)
Primary Care Physician: Danelle Berry, NP  Primary Gastroenterologist:  Dr. Lucilla Lame  Chief Complaint  Patient presents with  . Follow up GERD    HPI: Sara [name] is a 69 y.o. female here for follow-up of her GERD.  The patient has a history of temporal arteritis and had been on steroids for some time.  She states that her symptoms were much worse while she was on steroids.  She is continuing to have burning in her epigastric area.  She also reports that she is lost weight without trying.  She reports it to be about 15 pounds.  She denies any black stools or bloody stools.  The patient has been on omeprazole 40 mg a day and she states that she takes it after eating.  Past Medical History:  Diagnosis Date  . Arthritis    right shoulder  . Depression   . GERD (gastroesophageal reflux disease)   . History of blood clots   . Hyperlipidemia   . Hypertension   . Mild mitral regurgitation   . Osteoporosis   . Stomach ulcer   . Vascular disease    Sees Dr. Delana Meyer  . Vertigo    Last episode approx Aug 2015  . Wears dentures    full upper    Current Outpatient Medications  Medication Sig Dispense Refill  . apixaban (ELIQUIS) 5 MG TABS tablet Take 1 tablet (5 mg total) by mouth 2 (two) times daily. 8 tablet 0  . atorvastatin (LIPITOR) 40 MG tablet Take 40 mg by mouth at bedtime.     . calcium-vitamin D (OSCAL WITH D) 500-200 MG-UNIT tablet Take 2 tablets by mouth daily with breakfast.    . clopidogrel (PLAVIX) 75 MG tablet Take 1 tablet (75 mg total) by mouth daily. 90 tablet 3  . ibandronate (BONIVA) 150 MG tablet Take 150 mg by mouth every 30 (thirty) days.     Marland Kitchen losartan (COZAAR) 25 MG tablet Take 25 mg by mouth every morning.     . Tocilizumab (ACTEMRA ACTPEN) 162 MG/0.9ML SOAJ Inject into the skin.    Marland Kitchen traMADol (ULTRAM) 50 MG tablet Take 50 mg by mouth 4 (four) times daily.     Marland Kitchen venlafaxine XR (EFFEXOR-XR) 75 MG 24 hr capsule TAKE 1 CAPSULE BY MOUTH  DAILY WITH  BREAKFAST (Patient taking differently: Take 75 mg by mouth daily with breakfast. ) 90 capsule 3  . zolpidem (AMBIEN) 5 MG tablet Take 5 mg by mouth at bedtime as needed for sleep.     Marland Kitchen omeprazole (PRILOSEC) 40 MG capsule Take 1 capsule (40 mg total) by mouth daily. **PLEASE SCHEDULE FOLLOW UP APPT** (Patient not taking: Reported on 10/13/2019) 90 capsule 1  . pantoprazole (PROTONIX) 40 MG tablet Take 1 tablet (40 mg total) by mouth daily. 90 tablet 1  . senna-docusate (SENOKOT-S) 8.6-50 MG tablet Take 2 tablets by mouth at bedtime as needed for mild constipation. (Patient not taking: Reported on 07/10/2019)     No current facility-administered medications for this visit.    Allergies as of 12/23/2019 - Review Complete 12/23/2019  Allergen Reaction Noted  . Amoxicillin Other (See Comments) 03/09/2015  . Oxycodone Itching 07/10/2019  . Metoprolol tartrate Rash 07/08/2019  . Vicodin [hydrocodone-acetaminophen] Hives and Rash 12/18/2014    ROS:  General: Negative for anorexia, weight loss, fever, chills, fatigue, weakness. ENT: Negative for hoarseness, difficulty swallowing , nasal congestion. CV: Negative for chest pain, angina, palpitations, dyspnea on exertion, peripheral edema.  Respiratory: Negative for dyspnea at rest, dyspnea on exertion, cough, sputum, wheezing.  GI: See history of present illness. GU:  Negative for dysuria, hematuria, urinary incontinence, urinary frequency, nocturnal urination.  Endo: Negative for unusual weight change.    Physical Examination:   BP 102/61   Pulse (!) 114   Temp 98.9 F (37.2 C) (Oral)   Ht 5\' 3"  (1.6 m)   Wt 127 lb 12.8 oz (58 kg)   BMI 22.64 kg/m   General: Well-nourished, well-developed in no acute distress.  Eyes: No icterus. Conjunctivae pink. Lungs: Clear to auscultation bilaterally. Non-labored. Heart: Regular rate and rhythm, no murmurs rubs or gallops.  Abdomen: Bowel sounds are normal, nontender, nondistended, no  hepatosplenomegaly or masses, no abdominal bruits or hernia , no rebound or guarding.   Extremities: No lower extremity edema. No clubbing or deformities. Neuro: Alert and oriented x 3.  Grossly intact. Skin: Warm and dry, no jaundice.   Psych: Alert and cooperative, normal mood and affect.  Labs:    Imaging Studies: No results found.  Assessment and Plan:   Sara [name] is a 69 y.o. y/o female who comes in today with a history of reflux and has been taking omeprazole after she eats without relief.  The patient has been told to start samples of Dexilant once a day and she will do this for the next 2 weeks.  She will then start the prescription of pantoprazole that I am sending in the pharmacy to be taken once a day.  She states that her weight loss has been because she is not eating as much because she has the epigastric burning.  The patient has been explained the plan agrees with it.     Lucilla Lame, MD. Marval Regal    Note: This dictation was prepared with Dragon dictation along with smaller phrase technology. Any transcriptional errors that result from this process are unintentional.

## 2020-01-13 ENCOUNTER — Telehealth (INDEPENDENT_AMBULATORY_CARE_PROVIDER_SITE_OTHER): Payer: Self-pay | Admitting: Vascular Surgery

## 2020-01-13 NOTE — Telephone Encounter (Signed)
I do not have any paperwork, I would see if Dr. Delana Meyer has any paperwork related to this

## 2020-01-13 NOTE — Telephone Encounter (Signed)
Left voicemail informing the patient that I will speak with Dr Delana Meyer on Thursday and I will give her a call

## 2020-01-15 NOTE — Telephone Encounter (Signed)
I spoke with the patient and she will be dropping off her paper work for [employer] tomorrow morning

## 2020-01-28 ENCOUNTER — Other Ambulatory Visit (INDEPENDENT_AMBULATORY_CARE_PROVIDER_SITE_OTHER): Payer: Self-pay | Admitting: Nurse Practitioner

## 2020-02-08 ENCOUNTER — Other Ambulatory Visit: Payer: Self-pay | Admitting: Orthopedic Surgery

## 2020-02-17 ENCOUNTER — Encounter
Admission: RE | Admit: 2020-02-17 | Discharge: 2020-02-17 | Disposition: A | Discharge status: Home / Self Care | Payer: Medicare Other | Source: Ambulatory Visit | Attending: Orthopedic Surgery | Admitting: Orthopedic Surgery

## 2020-02-17 ENCOUNTER — Other Ambulatory Visit: Payer: Self-pay

## 2020-02-17 HISTORY — DX: Dyspnea, unspecified: R06.00

## 2020-02-17 NOTE — Patient Instructions (Signed)
Your procedure is scheduled on: Thursday 02/26/20 Report to [facility]. To find out your arrival time please call [phone] between 1PM - 3PM on Wednesday 02/25/20.  Remember: Instructions that are not followed completely may result in serious medical risk, up to and including death, or upon the discretion of your surgeon and anesthesiologist your surgery may need to be rescheduled.     _X__ 1. Do not eat food after midnight the night before your procedure.                 No gum chewing or hard candies. You may drink clear liquids up to 2 hours                 before you are scheduled to arrive for your surgery- DO not drink clear                 liquids within 2 hours of the start of your surgery.                 Clear Liquids include:  water, apple juice without pulp, clear carbohydrate                 drink such as Clearfast or Gatorade, Black Coffee or Tea (Do not add                 anything to coffee or tea). Diabetics water only  __X__2.  On the morning of surgery brush your teeth with toothpaste and water, you                 may rinse your mouth with mouthwash if you wish.  Do not swallow any              toothpaste of mouthwash.     _X__ 3.  No Alcohol for 24 hours before or after surgery.   _X__ 4.  Do Not Smoke or use e-cigarettes For 24 Hours Prior to Your Surgery.                 Do not use any chewable tobacco products for at least 6 hours prior to                 surgery.  ____  5.  Bring all medications with you on the day of surgery if instructed.   __X__  6.  Notify your doctor if there is any change in your medical condition      (cold, fever, infections).     Do not wear jewelry, make-up, hairpins, clips or nail polish. Do not wear lotions, powders, or perfumes.  Do not shave 48 hours prior to surgery. Men may shave face and neck. Do not bring valuables to the hospital.    [facility] is not responsible for  any belongings or valuables.  Contacts, dentures/partials or body piercings may not be worn into surgery. Bring a case for your contacts, glasses or hearing aids, a denture cup will be supplied. Leave your suitcase in the car. After surgery it may be brought to your room. For patients admitted to the hospital, discharge time is determined by your treatment team.   Patients discharged the day of surgery will not be allowed to drive home.   Please read over the following fact sheets that you were given:   MRSA Information  __X__ Take these medicines the morning of surgery with A SIP OF WATER:  1. diltiazem (CARDIZEM CD) 240 MG 24 hr capsule  2. pantoprazole (PROTONIX) 40 MG tablet  3. venlafaxine XR (EFFEXOR-XR) 150 MG 24 hr capsule  4. traMADol (ULTRAM) 50 MG tablet if needed  5.  6.  ____ Fleet Enema (as directed)   __X__ Use CHG Soap/SAGE wipes as directed  ____ Use inhalers on the day of surgery  ____ Stop metformin/Janumet/Farxiga 2 days prior to surgery    ____ Take 1/2 of usual insulin dose the night before surgery. No insulin the morning          of surgery.   __X__ Stop Blood Thinners Coumadin/Plavix/Xarelto/Pleta/Pradaxa/Eliquis/Effient/Aspirin  AS PREVIOUSLY INSTRUCTED   __X__ Stop Anti-inflammatories 7 days before surgery such as Advil, Ibuprofen, Motrin,  BC or Goodies Powder, Naprosyn, Naproxen, Aleve, Aspirin    __X__ Stop all herbal supplements, fish oil or vitamin E until after surgery.    ____ Bring C-Pap to the hospital.

## 2020-02-18 ENCOUNTER — Encounter
Admission: RE | Admit: 2020-02-18 | Discharge: 2020-02-18 | Disposition: A | Discharge status: Home / Self Care | Payer: Medicare Other | Source: Ambulatory Visit | Attending: Orthopedic Surgery | Admitting: Orthopedic Surgery

## 2020-02-18 DIAGNOSIS — Z01818 Encounter for other preprocedural examination: Secondary | ICD-10-CM | POA: Diagnosis not present

## 2020-02-18 LAB — CBC WITH DIFFERENTIAL/PLATELET
Abs Immature Granulocytes: 0.01 10*3/uL (ref 0.00–0.07)
Basophils Absolute: 0.1 10*3/uL (ref 0.0–0.1)
Basophils Relative: 2 %
Eosinophils Absolute: 0.2 10*3/uL (ref 0.0–0.5)
Eosinophils Relative: 4 %
HCT: 38.9 % (ref 36.0–46.0)
Hemoglobin: 12.1 g/dL (ref 12.0–15.0)
Immature Granulocytes: 0 %
Lymphocytes Relative: 17 %
Lymphs Abs: 0.7 10*3/uL (ref 0.7–4.0)
MCH: 25.3 pg — ABNORMAL LOW (ref 26.0–34.0)
MCHC: 31.1 g/dL (ref 30.0–36.0)
MCV: 81.2 fL (ref 80.0–100.0)
Monocytes Absolute: 0.6 10*3/uL (ref 0.1–1.0)
Monocytes Relative: 14 %
Neutro Abs: 2.7 10*3/uL (ref 1.7–7.7)
Neutrophils Relative %: 63 %
Platelets: 227 10*3/uL (ref 150–400)
RBC: 4.79 MIL/uL (ref 3.87–5.11)
RDW: 15.9 % — ABNORMAL HIGH (ref 11.5–15.5)
WBC: 4.3 10*3/uL (ref 4.0–10.5)
nRBC: 0 % (ref 0.0–0.2)

## 2020-02-18 LAB — BASIC METABOLIC PANEL
Anion gap: 9 (ref 5–15)
BUN: 20 mg/dL (ref 8–23)
CO2: 28 mmol/L (ref 22–32)
Calcium: 9.4 mg/dL (ref 8.9–10.3)
Chloride: 102 mmol/L (ref 98–111)
Creatinine, Ser: 0.97 mg/dL (ref 0.44–1.00)
GFR calc Af Amer: >60 mL/min (ref >60)
GFR calc non Af Amer: >60 mL/min (ref >60)
Glucose, Bld: 85 mg/dL (ref 70–99)
Potassium: 4.1 mmol/L (ref 3.5–5.1)
Sodium: 139 mmol/L (ref 135–145)

## 2020-02-18 LAB — PROTIME-INR
INR: 1.3 — ABNORMAL HIGH (ref 0.8–1.2)
Prothrombin Time: 16 seconds — ABNORMAL HIGH (ref 11.4–15.2)

## 2020-02-18 LAB — APTT: aPTT: 33 seconds (ref 24–36)

## 2020-02-18 NOTE — Progress Notes (Signed)
[facility] Perioperative Services  Pre-Admission/Anesthesia Testing Clinical Review  Date: 02/18/20  Patient Demographics:  Name: Sara [name] DOB:   [birth year] MRN:   [id number]  Planned Surgical Procedure(s):    Case: [id number] Date/Time: 02/26/20 1055   Procedure: RIGHT SHOULDER ARTHROSCOPY SUBACROMIAL DECOMPRESSION, DISTAL CLAVICLE EXCISION AND MINI-OPEN ROTATOR CUFF REPAIR (Right )   Anesthesia type: General   Pre-op diagnosis: Complete Rotator Cuff Tear of Right Shoulder   Location: [facility] OR ROOM 02 / [place] [facility]S FOR ANESTHESIA GROUP   Surgeons: Thornton Park, MD     NOTE: Available PAT nursing documentation and vital signs have been reviewed. Clinical nursing staff has updated patient's PMH/PSHx, current medication list, and drug allergies/intolerances to ensure comprehensive history available to assist in medical decision making as it pertains to the aforementioned surgical procedure and anticipated anesthetic course.   Clinical Discussion:  Sara [name] is a 69 y.o. female who is submitted for pre-surgical anesthesia review and clearance prior to her undergoing the above procedure. Patient is a Former Smoker (50 pack years; quit 08/1991). Pertinent PMH includes: HTN, HLD, PVD (s/p femoral-popliteal bypass in 05/2019), lower extremity compartment syndrome, DVT, PUD, GERD, MV insufficiency, GCA, depression.   Patient is followed by vascular surgery Delana Meyer, MD). She was last seen in the office on 10/13/2019; notes reviewed. She was following femoral-popliteal bypass done in 05/2019. 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 She continued on anti-platelet and anticoagulation therapies as prescribed. Dr. Delana Meyer issued clearance for the above procedure on 02/05/2020 with a MODERATE risk stratification. She has been instructed on recommendations for holding her apixaban and clopedigrel  for 5 days prior to her procedure; resumes 1 day post-op. The patient has been instructed that her last dose of her anticoagulant will be on 02/22/2020.  She denies previous intra-operative complications with anesthesia. Patient underwent procedure here on 70/08/7492; uncomplicated TIVA course.  Vitals with BMI 02/17/2020 12/23/2019 10/13/2019  Height 5\' 3"  5\' 3"  5\' 3"   Weight 123 lbs 127 lbs 13 oz 130 lbs  BMI 21.79 49.67 59.16  Systolic - 384 665  Diastolic - 61 87  Pulse - 114 95    Providers/Specialists:   PROVIDER ROLE LAST Larey Seat, MD Orthopedics (Surgeon) 01/30/2020  Danelle Berry, NP Primary Care Provider ?  [name], MD Vascular 10/13/2019   Allergies:  Amoxicillin, Oxycodone, Metoprolol tartrate, and Vicodin [hydrocodone-acetaminophen]  Current Home Medications:   No current facility-administered medications for this encounter.   Marland Kitchen apixaban (ELIQUIS) 5 MG TABS tablet  . atorvastatin (LIPITOR) 40 MG tablet  . calcium carbonate (OSCAL) 1500 (600 Ca) MG TABS tablet  . cholecalciferol (VITAMIN D3) 25 MCG (1000 UNIT) tablet  . clopidogrel (PLAVIX) 75 MG tablet  . diltiazem (CARDIZEM CD) 240 MG 24 hr capsule  . ibandronate (BONIVA) 150 MG tablet  . losartan (COZAAR) 25 MG tablet  . pantoprazole (PROTONIX) 40 MG tablet  . Tocilizumab (ACTEMRA ACTPEN) 162 MG/0.9ML SOAJ  . traMADol (ULTRAM) 50 MG tablet  . venlafaxine XR (EFFEXOR-XR) 150 MG 24 hr capsule  . zolpidem (AMBIEN) 5 MG tablet   History:   Past Medical History:  Diagnosis Date  . Arthritis    right shoulder  . Depression   . Dyspnea   . GERD (gastroesophageal reflux disease)   . History of blood clots   . Hyperlipidemia   . Hypertension   . Mild mitral regurgitation   .  Osteoporosis   . Stomach ulcer   . Vascular disease    Sees Dr. Delana Meyer  . Vertigo    Last episode approx Aug 2015  . Wears dentures    full upper   Past Surgical History:  Procedure Laterality Date  .  ADENOIDECTOMY    . ARTERY BIOPSY Right 07/14/2019   Procedure: BIOPSY TEMPORAL ARTERY;  Surgeon: [name], MD;  Location: [facility] ORS;  Service: Vascular;  Laterality: Right;  . BREAST CYST ASPIRATION Left   . CARDIAC CATHETERIZATION  02/15/2017   [facility]  . COLONOSCOPY WITH PROPOFOL N/A 10/22/2017   Procedure: COLONOSCOPY WITH PROPOFOL;  Surgeon: Lucilla Lame, MD;  Location: [place];  Service: Endoscopy;  Laterality: N/A;  specimens not taken--pt on Plavix will be brought back in after 7 days off med  . COLONOSCOPY WITH PROPOFOL N/A 11/05/2017   Procedure: COLONOSCOPY WITH PROPOFOL;  Surgeon: Lucilla Lame, MD;  Location: [place];  Service: Endoscopy;  Laterality: N/A;  . ESOPHAGOGASTRODUODENOSCOPY N/A 12/21/2014   Procedure: ESOPHAGOGASTRODUODENOSCOPY (EGD);  Surgeon: Lucilla Lame, MD;  Location: [place];  Service: Gastroenterology;  Laterality: N/A;  . ESOPHAGOGASTRODUODENOSCOPY (EGD) WITH PROPOFOL N/A 02/08/2016   Procedure: ESOPHAGOGASTRODUODENOSCOPY (EGD) WITH PROPOFOL;  Surgeon: Lucilla Lame, MD;  Location: [facility] ENDOSCOPY;  Service: Endoscopy;  Laterality: N/A;  . FASCIOTOMY Right 05/30/2019   Procedure: FASCIOTOMY;  Surgeon: [name], MD;  Location: [facility] ORS;  Service: Vascular;  Laterality: Right;  . FASCIOTOMY CLOSURE Right 06/04/2019   Procedure: FASCIOTOMY CLOSURE;  Surgeon: [name], MD;  Location: [facility] ORS;  Service: Vascular;  Laterality: Right;  . HEMORROIDECTOMY  2014  . LOWER EXTREMITY ANGIOGRAPHY Right 05/30/2019   Procedure: LOWER EXTREMITY ANGIOGRAPHY;  Surgeon: [name], MD;  Location: [place] [facility];  Service: Cardiovascular;  Laterality: Right;  . PERIPHERAL VASCULAR CATHETERIZATION N/A 02/09/2016   Procedure: Abdominal Aortogram w/Lower Extremity;  Surgeon: [name], MD;  Location: [place] [facility];  Service: Cardiovascular;  Laterality: N/A;  . PERIPHERAL VASCULAR CATHETERIZATION Right  02/10/2016   Procedure: Lower Extremity Angiography;  Surgeon: [name], MD;  Location: [place] [facility];  Service: Cardiovascular;  Laterality: Right;  . POLYPECTOMY  11/05/2017   Procedure: POLYPECTOMY;  Surgeon: Lucilla Lame, MD;  Location: [place];  Service: Endoscopy;;  . TONSILLECTOMY    . VASCULAR SURGERY  1660,6301   Fem-Pop Bypass   Family History  Problem Relation Age of Onset  . CVA Mother   . Heart attack Mother   . Cancer Father        colon cancer  . Colon cancer Father   . Heart disease Maternal Uncle   . Breast cancer Neg Hx    Social History   Tobacco Use  . Smoking status: Former Smoker    Packs/day: 2.00    Years: 25.00    Pack years: 50.00    Types: Cigarettes    Quit date: 08/22/1991    Years since quitting: 28.5  . Smokeless tobacco: Never Used  Vaping Use  . Vaping Use: Never used  Substance Use Topics  . Alcohol use: No    Alcohol/week: 0.0 standard drinks  . Drug use: Never    Pertinent Clinical Results:  No orders of the defined types were placed in this encounter.  LABS: Labs reviewed: Acceptable for surgery.   Hospital Outpatient Visit on 02/18/2020  Component Date Value Ref Range Status  . aPTT 02/18/2020 33  24 -  36 seconds Final   Performed at [facility], [address] [place], [place] [address]  . Sodium 02/18/2020 139  135 - 145 mmol/L Final  . Potassium 02/18/2020 4.1  3.5 - 5.1 mmol/L Final  . Chloride 02/18/2020 102  98 - 111 mmol/L Final  . CO2 02/18/2020 28  22 - 32 mmol/L Final  . Glucose, Bld 02/18/2020 85  70 - 99 mg/dL Final   Glucose reference range applies only to samples taken after fasting for at least 8 hours.  . BUN 02/18/2020 20  8 - 23 mg/dL Final  . Creatinine, Ser 02/18/2020 0.97  0.44 - 1.00 mg/dL Final  . Calcium 02/18/2020 9.4  8.9 - 10.3 mg/dL Final  . GFR calc non Af Amer 02/18/2020 >60  >60 mL/min Final  . GFR calc Af Amer 02/18/2020 >60  >60 mL/min Final  . Anion gap  02/18/2020 9  5 - 15 Final   Performed at [facility], [address] [place], [place] [address]  . WBC 02/18/2020 4.3  4.0 - 10.5 K/uL Final  . RBC 02/18/2020 4.79  3.87 - 5.11 MIL/uL Final  . Hemoglobin 02/18/2020 12.1  12.0 - 15.0 g/dL Final  . HCT 02/18/2020 38.9  36 - 46 % Final  . MCV 02/18/2020 81.2  80.0 - 100.0 fL Final  . MCH 02/18/2020 25.3* 26.0 - 34.0 pg Final  . MCHC 02/18/2020 31.1  30.0 - 36.0 g/dL Final  . RDW 02/18/2020 15.9* 11.5 - 15.5 % Final  . Platelets 02/18/2020 227  150 - 400 K/uL Final  . nRBC 02/18/2020 0.0  0.0 - 0.2 % Final  . Neutrophils Relative % 02/18/2020 63  % Final  . Neutro Abs 02/18/2020 2.7  1.7 - 7.7 K/uL Final  . Lymphocytes Relative 02/18/2020 17  % Final  . Lymphs Abs 02/18/2020 0.7  0.7 - 4.0 K/uL Final  . Monocytes Relative 02/18/2020 14  % Final  . Monocytes Absolute 02/18/2020 0.6  0 - 1 K/uL Final  . Eosinophils Relative 02/18/2020 4  % Final  . Eosinophils Absolute 02/18/2020 0.2  0 - 0 K/uL Final  . Basophils Relative 02/18/2020 2  % Final  . Basophils Absolute 02/18/2020 0.1  0 - 0 K/uL Final  . Immature Granulocytes 02/18/2020 0  % Final  . Abs Immature Granulocytes 02/18/2020 0.01  0.00 - 0.07 K/uL Final   Performed at [facility], [address] [place], [place] [address]  . Prothrombin Time 02/18/2020 16.0* 11.4 - 15.2 seconds Final  . INR 02/18/2020 1.3* 0.8 - 1.2 Final   Comment: (NOTE) INR goal varies based on device and disease states. Performed at [facility], [address] [place], [place] [address]     EKG: Date: 02/18/2020 Time ECG obtained: 0940 AM Rate: 85 bpm Rhythm: normal sinus Axis (leads I and aVF): Normal Intervals: PR 124 ms. QTc 416 ms. ST segment and T wave changes: No evidence of ST segment elevation or depression Comparison: Similar to previous tracing obtained on 07/11/2019  IMAGING / PROCEDURES: RIGHT HEART CATHETERIZATION done on 02/16/2017 at [facility] 1. No  angiographically apparent coronary artery disease. 2. Normal right heart catheterization   ECHOCARDIOGRAM done on 02/08/2017 at [facility] 1. Normal left ventricular systolic function, ejection fraction > 55%  2. Degenerative mitral valve disease  3. Mitral regurgitation - mild  4. Normal right ventricular systolic function  Impression and Plan:  POET HINEMAN has been referred for pre-anesthesia review and  clearance prior her undergoing the planned anesthetic and procedural courses. Available labs, pertinent testing, and imaging results were personally reviewed by me. This patient has been appropriately cleared by vascular ssurgeon.   Based on clinical review performed today (02/18/20), barring and significant acute changes in the patient's overall condition, it is anticipated that she will be able to proceed with the planned surgical intervention. Any acute changes in clinical condition may necessitate her procedure being postponed and/or cancelled. Pre-surgical instructions were reviewed with the patient during her PAT appointment and questions were fielded by PAT clinical staff.  Honor Loh, MSN, APRN, FNP-C, CEN [facility]  Peri-operative Services Nurse Practitioner Phone: [phone] 02/18/20 12:12 PM  NOTE: This note has been prepared using Dragon dictation software. Despite my best ability to proofread, there is always the potential that unintentional transcriptional errors may still occur from this process.

## 2020-02-24 ENCOUNTER — Other Ambulatory Visit: Admission: RE | Admit: 2020-02-24 | Payer: Medicare Other | Source: Ambulatory Visit

## 2020-02-25 ENCOUNTER — Other Ambulatory Visit: Payer: Self-pay

## 2020-02-25 ENCOUNTER — Other Ambulatory Visit
Admission: RE | Admit: 2020-02-25 | Discharge: 2020-02-25 | Disposition: A | Discharge status: Home / Self Care | Payer: Medicare Other | Source: Ambulatory Visit | Attending: Orthopedic Surgery | Admitting: Orthopedic Surgery

## 2020-02-25 DIAGNOSIS — Z20822 Contact with and (suspected) exposure to covid-19: Secondary | ICD-10-CM | POA: Diagnosis not present

## 2020-02-25 DIAGNOSIS — Z01812 Encounter for preprocedural laboratory examination: Secondary | ICD-10-CM | POA: Insufficient documentation

## 2020-02-25 LAB — SARS CORONAVIRUS 2 (TAT 6-24 HRS): SARS Coronavirus 2: NEGATIVE

## 2020-02-27 ENCOUNTER — Ambulatory Visit: Payer: Medicare Other

## 2020-02-27 ENCOUNTER — Ambulatory Visit: Payer: Medicare Other | Admitting: Urgent Care

## 2020-02-27 ENCOUNTER — Other Ambulatory Visit: Payer: Self-pay

## 2020-02-27 ENCOUNTER — Encounter
Admission: RE | Disposition: A | Discharge status: Home / Self Care | Payer: Self-pay | Source: Home / Self Care | Attending: Orthopedic Surgery

## 2020-02-27 ENCOUNTER — Encounter: Payer: Self-pay | Admitting: Orthopedic Surgery

## 2020-02-27 ENCOUNTER — Ambulatory Visit
Admission: RE | Admit: 2020-02-27 | Discharge: 2020-02-27 | Disposition: A | Discharge status: Home / Self Care | Payer: Medicare Other | Attending: Orthopedic Surgery | Admitting: Orthopedic Surgery

## 2020-02-27 DIAGNOSIS — E785 Hyperlipidemia, unspecified: Secondary | ICD-10-CM | POA: Diagnosis not present

## 2020-02-27 DIAGNOSIS — M75111 Incomplete rotator cuff tear or rupture of right shoulder, not specified as traumatic: Secondary | ICD-10-CM | POA: Diagnosis not present

## 2020-02-27 DIAGNOSIS — K219 Gastro-esophageal reflux disease without esophagitis: Secondary | ICD-10-CM | POA: Insufficient documentation

## 2020-02-27 DIAGNOSIS — M7581 Other shoulder lesions, right shoulder: Secondary | ICD-10-CM | POA: Insufficient documentation

## 2020-02-27 DIAGNOSIS — Z800 Family history of malignant neoplasm of digestive organs: Secondary | ICD-10-CM | POA: Diagnosis not present

## 2020-02-27 DIAGNOSIS — I340 Nonrheumatic mitral (valve) insufficiency: Secondary | ICD-10-CM | POA: Diagnosis not present

## 2020-02-27 DIAGNOSIS — M7551 Bursitis of right shoulder: Secondary | ICD-10-CM | POA: Diagnosis not present

## 2020-02-27 DIAGNOSIS — M7541 Impingement syndrome of right shoulder: Secondary | ICD-10-CM | POA: Diagnosis not present

## 2020-02-27 DIAGNOSIS — M19011 Primary osteoarthritis, right shoulder: Secondary | ICD-10-CM | POA: Diagnosis not present

## 2020-02-27 DIAGNOSIS — I739 Peripheral vascular disease, unspecified: Secondary | ICD-10-CM | POA: Diagnosis not present

## 2020-02-27 DIAGNOSIS — K279 Peptic ulcer, site unspecified, unspecified as acute or chronic, without hemorrhage or perforation: Secondary | ICD-10-CM | POA: Insufficient documentation

## 2020-02-27 DIAGNOSIS — Z86718 Personal history of other venous thrombosis and embolism: Secondary | ICD-10-CM | POA: Diagnosis not present

## 2020-02-27 DIAGNOSIS — Z7901 Long term (current) use of anticoagulants: Secondary | ICD-10-CM | POA: Insufficient documentation

## 2020-02-27 DIAGNOSIS — Z8249 Family history of ischemic heart disease and other diseases of the circulatory system: Secondary | ICD-10-CM | POA: Diagnosis not present

## 2020-02-27 DIAGNOSIS — Z87891 Personal history of nicotine dependence: Secondary | ICD-10-CM | POA: Diagnosis not present

## 2020-02-27 DIAGNOSIS — I2510 Atherosclerotic heart disease of native coronary artery without angina pectoris: Secondary | ICD-10-CM | POA: Insufficient documentation

## 2020-02-27 DIAGNOSIS — Z881 Allergy status to other antibiotic agents status: Secondary | ICD-10-CM | POA: Diagnosis not present

## 2020-02-27 DIAGNOSIS — Z419 Encounter for procedure for purposes other than remedying health state, unspecified: Secondary | ICD-10-CM

## 2020-02-27 DIAGNOSIS — Z9889 Other specified postprocedural states: Secondary | ICD-10-CM | POA: Insufficient documentation

## 2020-02-27 DIAGNOSIS — Z888 Allergy status to other drugs, medicaments and biological substances status: Secondary | ICD-10-CM | POA: Insufficient documentation

## 2020-02-27 DIAGNOSIS — I10 Essential (primary) hypertension: Secondary | ICD-10-CM | POA: Diagnosis not present

## 2020-02-27 DIAGNOSIS — K449 Diaphragmatic hernia without obstruction or gangrene: Secondary | ICD-10-CM | POA: Insufficient documentation

## 2020-02-27 DIAGNOSIS — F329 Major depressive disorder, single episode, unspecified: Secondary | ICD-10-CM | POA: Diagnosis not present

## 2020-02-27 DIAGNOSIS — Z880 Allergy status to penicillin: Secondary | ICD-10-CM | POA: Diagnosis not present

## 2020-02-27 DIAGNOSIS — Z885 Allergy status to narcotic agent status: Secondary | ICD-10-CM | POA: Insufficient documentation

## 2020-02-27 DIAGNOSIS — Z79899 Other long term (current) drug therapy: Secondary | ICD-10-CM | POA: Insufficient documentation

## 2020-02-27 DIAGNOSIS — Z823 Family history of stroke: Secondary | ICD-10-CM | POA: Insufficient documentation

## 2020-02-27 DIAGNOSIS — X58XXXA Exposure to other specified factors, initial encounter: Secondary | ICD-10-CM | POA: Diagnosis not present

## 2020-02-27 DIAGNOSIS — M810 Age-related osteoporosis without current pathological fracture: Secondary | ICD-10-CM | POA: Insufficient documentation

## 2020-02-27 DIAGNOSIS — S43431A Superior glenoid labrum lesion of right shoulder, initial encounter: Secondary | ICD-10-CM | POA: Diagnosis not present

## 2020-02-27 HISTORY — PX: SHOULDER ARTHROSCOPY WITH ROTATOR CUFF REPAIR AND SUBACROMIAL DECOMPRESSION: SHX5686

## 2020-02-27 SURGERY — SHOULDER ARTHROSCOPY WITH ROTATOR CUFF REPAIR AND SUBACROMIAL DECOMPRESSION
Anesthesia: Regional | Laterality: Right

## 2020-02-27 MED ORDER — ONDANSETRON HCL 4 MG/2ML IJ SOLN: 4.0000 mg | Freq: Once | INTRAMUSCULAR | Status: DC | PRN

## 2020-02-27 MED ORDER — FENTANYL CITRATE (PF) 100 MCG/2ML IJ SOLN: 50.0000 ug | Freq: Once | INTRAMUSCULAR | Status: AC

## 2020-02-27 MED ORDER — LIDOCAINE HCL (PF) 1 % IJ SOLN
INTRAMUSCULAR | Status: AC
  Filled 2020-02-27: qty 30

## 2020-02-27 MED ORDER — FENTANYL CITRATE (PF) 100 MCG/2ML IJ SOLN
INTRAMUSCULAR | Status: AC
  Filled 2020-02-27: qty 2

## 2020-02-27 MED ORDER — CEFAZOLIN SODIUM-DEXTROSE 2-4 GM/100ML-% IV SOLN
INTRAVENOUS | Status: AC
  Filled 2020-02-27: qty 100

## 2020-02-27 MED ORDER — CEFAZOLIN SODIUM-DEXTROSE 2-4 GM/100ML-% IV SOLN
2.0000 g | INTRAVENOUS | Status: AC
  Administered 2020-02-27: 2 g via INTRAVENOUS

## 2020-02-27 MED ORDER — ONDANSETRON HCL 4 MG/2ML IJ SOLN
INTRAMUSCULAR | Status: DC | PRN
  Administered 2020-02-27: 4 mg via INTRAVENOUS

## 2020-02-27 MED ORDER — BUPIVACAINE LIPOSOME 1.3 % IJ SUSP
INTRAMUSCULAR | Status: AC
  Filled 2020-02-27: qty 20

## 2020-02-27 MED ORDER — MIDAZOLAM HCL 2 MG/2ML IJ SOLN
INTRAMUSCULAR | Status: AC
  Administered 2020-02-27: 1 mg via INTRAVENOUS
  Filled 2020-02-27: qty 2

## 2020-02-27 MED ORDER — CHLORHEXIDINE GLUCONATE CLOTH 2 % EX PADS
6.0000 | MEDICATED_PAD | Freq: Once | CUTANEOUS | Status: AC
  Administered 2020-02-27: 6 via TOPICAL

## 2020-02-27 MED ORDER — DEXAMETHASONE SODIUM PHOSPHATE 10 MG/ML IJ SOLN
INTRAMUSCULAR | Status: AC
  Filled 2020-02-27: qty 1

## 2020-02-27 MED ORDER — BUPIVACAINE HCL (PF) 0.25 % IJ SOLN
INTRAMUSCULAR | Status: AC
  Filled 2020-02-27: qty 30

## 2020-02-27 MED ORDER — ONDANSETRON HCL 4 MG/2ML IJ SOLN
INTRAMUSCULAR | Status: AC
  Filled 2020-02-27: qty 2

## 2020-02-27 MED ORDER — FENTANYL CITRATE (PF) 100 MCG/2ML IJ SOLN
INTRAMUSCULAR | Status: AC
  Administered 2020-02-27: 50 ug via INTRAVENOUS
  Filled 2020-02-27: qty 2

## 2020-02-27 MED ORDER — MIDAZOLAM HCL 2 MG/2ML IJ SOLN
1.0000 mg | Freq: Once | INTRAMUSCULAR | Status: AC
  Administered 2020-02-27: 1 mg via INTRAVENOUS

## 2020-02-27 MED ORDER — MIDAZOLAM HCL 2 MG/2ML IJ SOLN: 1.0000 mg | Freq: Once | INTRAMUSCULAR | Status: AC

## 2020-02-27 MED ORDER — BUPIVACAINE HCL (PF) 0.5 % IJ SOLN
INTRAMUSCULAR | Status: DC | PRN
Start: 2020-02-27 — End: 2020-02-27
  Administered 2020-02-27: 10 mL

## 2020-02-27 MED ORDER — CHLORHEXIDINE GLUCONATE 0.12 % MT SOLN
OROMUCOSAL | Status: AC
  Administered 2020-02-27: 15 mL via OROMUCOSAL
  Filled 2020-02-27: qty 15

## 2020-02-27 MED ORDER — ROCURONIUM BROMIDE 10 MG/ML (PF) SYRINGE
PREFILLED_SYRINGE | INTRAVENOUS | Status: AC
  Filled 2020-02-27: qty 10

## 2020-02-27 MED ORDER — CHLORHEXIDINE GLUCONATE 0.12 % MT SOLN: 15.0000 mL | Freq: Once | OROMUCOSAL | Status: AC

## 2020-02-27 MED ORDER — EPINEPHRINE PF 1 MG/ML IJ SOLN
INTRAMUSCULAR | Status: AC
  Filled 2020-02-27: qty 4

## 2020-02-27 MED ORDER — ROCURONIUM BROMIDE 100 MG/10ML IV SOLN
INTRAVENOUS | Status: DC | PRN
  Administered 2020-02-27: 50 mg via INTRAVENOUS

## 2020-02-27 MED ORDER — ORAL CARE MOUTH RINSE: 15.0000 mL | Freq: Once | OROMUCOSAL | Status: AC

## 2020-02-27 MED ORDER — ACETAMINOPHEN 10 MG/ML IV SOLN: 1000.0000 mg | Freq: Once | INTRAVENOUS | Status: DC | PRN

## 2020-02-27 MED ORDER — BUPIVACAINE HCL (PF) 0.5 % IJ SOLN
INTRAMUSCULAR | Status: AC
  Filled 2020-02-27: qty 10

## 2020-02-27 MED ORDER — PROPOFOL 10 MG/ML IV BOLUS
INTRAVENOUS | Status: DC | PRN
  Administered 2020-02-27: 120 mg via INTRAVENOUS

## 2020-02-27 MED ORDER — ONDANSETRON HCL 4 MG PO TABS: 4.0000 mg | ORAL_TABLET | Freq: Three times a day (TID) | ORAL | 0 refills | Status: DC | PRN

## 2020-02-27 MED ORDER — DEXAMETHASONE SODIUM PHOSPHATE 10 MG/ML IJ SOLN
INTRAMUSCULAR | Status: DC | PRN
  Administered 2020-02-27: 10 mg via INTRAVENOUS

## 2020-02-27 MED ORDER — BUPIVACAINE LIPOSOME 1.3 % IJ SUSP
INTRAMUSCULAR | Status: DC | PRN
  Administered 2020-02-27: 10 mL

## 2020-02-27 MED ORDER — NEOMYCIN-POLYMYXIN B GU 40-200000 IR SOLN
Status: AC
  Filled 2020-02-27: qty 20

## 2020-02-27 MED ORDER — BACITRACIN-NEOMYCIN-POLYMYXIN 400-5-5000 EX OINT
TOPICAL_OINTMENT | CUTANEOUS | Status: AC
  Filled 2020-02-27: qty 2

## 2020-02-27 MED ORDER — FENTANYL CITRATE (PF) 100 MCG/2ML IJ SOLN
INTRAMUSCULAR | Status: DC | PRN
  Administered 2020-02-27 (×2): 50 ug via INTRAVENOUS

## 2020-02-27 MED ORDER — FENTANYL CITRATE (PF) 100 MCG/2ML IJ SOLN: 25.0000 ug | INTRAMUSCULAR | Status: DC | PRN

## 2020-02-27 MED ORDER — PHENYLEPHRINE HCL-NACL 10-0.9 MG/250ML-% IV SOLN
INTRAVENOUS | Status: DC | PRN
Start: 2020-02-27 — End: 2020-02-27
  Administered 2020-02-27: 30 ug/min via INTRAVENOUS

## 2020-02-27 MED ORDER — LACTATED RINGERS IV SOLN
INTRAVENOUS | Status: DC | PRN
  Administered 2020-02-27: 4 mL

## 2020-02-27 MED ORDER — LACTATED RINGERS IV SOLN: INTRAVENOUS | Status: DC

## 2020-02-27 MED ORDER — PROPOFOL 10 MG/ML IV BOLUS
INTRAVENOUS | Status: AC
  Filled 2020-02-27: qty 40

## 2020-02-27 MED ORDER — LIDOCAINE HCL (PF) 2 % IJ SOLN
INTRAMUSCULAR | Status: AC
  Filled 2020-02-27: qty 5

## 2020-02-27 MED ORDER — LIDOCAINE HCL (CARDIAC) PF 100 MG/5ML IV SOSY
PREFILLED_SYRINGE | INTRAVENOUS | Status: DC | PRN
  Administered 2020-02-27: 40 mg via INTRAVENOUS

## 2020-02-27 MED ORDER — SUGAMMADEX SODIUM 200 MG/2ML IV SOLN
INTRAVENOUS | Status: DC | PRN
  Administered 2020-02-27: 50 mg via INTRAVENOUS

## 2020-02-27 MED ORDER — OXYCODONE HCL 5 MG PO TABS: 5.0000 mg | ORAL_TABLET | ORAL | 0 refills | Status: DC | PRN

## 2020-02-27 MED ORDER — NEOMYCIN-POLYMYXIN B GU 40-200000 IR SOLN
Status: DC | PRN
  Administered 2020-02-27: 2 mL

## 2020-02-27 SURGICAL SUPPLY — 54 items
ADAPTER IRRIG TUBE 2 SPIKE SOL (ADAPTER) ×4 IMPLANT
ANCHOR ALL-SUT Q-FIX 2.8 (Anchor) ×2 IMPLANT
ANCHOR SUT 5.5 MULTIFIX (Orthopedic Implant) ×4 IMPLANT
BUR RADIUS 4.0X18.5 (BURR) ×2 IMPLANT
BUR RADIUS 5.5 (BURR) ×2 IMPLANT
CANISTER SUCT LVC 12 LTR MEDI- (MISCELLANEOUS) IMPLANT
CANNULA 5.75X7 CRYSTAL CLEAR (CANNULA) ×4 IMPLANT
CANNULA PARTIAL THREAD 2X7 (CANNULA) ×2 IMPLANT
CANNULA TWIST IN 8.25X9CM (CANNULA) ×4 IMPLANT
CONNECTOR PERFECT PASSER (CONNECTOR) ×6 IMPLANT
COOLER POLAR GLACIER W/PUMP (MISCELLANEOUS) ×2 IMPLANT
COVER WAND RF STERILE (DRAPES) ×2 IMPLANT
DEVICE SUCT BLK HOLE OR FLOOR (MISCELLANEOUS) ×2 IMPLANT
DRAPE 3/4 80X56 (DRAPES) ×2 IMPLANT
DRAPE IMP U-DRAPE 54X76 (DRAPES) ×4 IMPLANT
DRAPE INCISE IOBAN 66X45 STRL (DRAPES) ×2 IMPLANT
DRAPE U-SHAPE 47X51 STRL (DRAPES) ×2 IMPLANT
DURAPREP 26ML APPLICATOR (WOUND CARE) ×6 IMPLANT
ELECT REM PT RETURN 9FT ADLT (ELECTROSURGICAL) ×2
ELECTRODE REM PT RTRN 9FT ADLT (ELECTROSURGICAL) ×1 IMPLANT
GAUZE SPONGE 4X4 12PLY STRL (GAUZE/BANDAGES/DRESSINGS) ×2 IMPLANT
GAUZE XEROFORM 1X8 LF (GAUZE/BANDAGES/DRESSINGS) ×2 IMPLANT
IV LACTATED RINGER IRRG 3000ML (IV SOLUTION) ×9
IV LR IRRIG 3000ML ARTHROMATIC (IV SOLUTION) ×9 IMPLANT
KIT STABILIZATION SHOULDER (MISCELLANEOUS) ×2 IMPLANT
KIT SUTURE 2.8 Q-FIX DISP (MISCELLANEOUS) ×4 IMPLANT
KIT SUTURETAK 3.0 INSERT PERC (KITS) IMPLANT
KIT TURNOVER KIT A (KITS) ×2 IMPLANT
MANIFOLD NEPTUNE II (INSTRUMENTS) ×2 IMPLANT
MASK FACE SPIDER DISP (MASK) ×2 IMPLANT
MAT ABSORB  FLUID 56X50 GRAY (MISCELLANEOUS) ×2
MAT ABSORB FLUID 56X50 GRAY (MISCELLANEOUS) ×2 IMPLANT
NDL SAFETY ECLIPSE 18X1.5 (NEEDLE) ×1 IMPLANT
NEEDLE HYPO 18GX1.5 SHARP (NEEDLE) ×1
NEEDLE HYPO 22GX1.5 SAFETY (NEEDLE) ×2 IMPLANT
PACK SHDR ARTHRO (MISCELLANEOUS) ×2 IMPLANT
PAD ARMBOARD 7.5X6 YLW CONV (MISCELLANEOUS) ×4 IMPLANT
PAD WRAPON POLAR SHDR XLG (MISCELLANEOUS) ×1 IMPLANT
PASSER SUT CAPTURE FIRST (SUTURE) ×2 IMPLANT
PASSER SUT FIRSTPASS SELF (INSTRUMENTS) ×2 IMPLANT
SET TUBE SUCT SHAVER OUTFL 24K (TUBING) ×2 IMPLANT
SET TUBE TIP INTRA-ARTICULAR (MISCELLANEOUS) ×2 IMPLANT
STRIP CLOSURE SKIN 1/2X4 (GAUZE/BANDAGES/DRESSINGS) ×2 IMPLANT
SUT LASSO 90 DEG SD STR (SUTURE) IMPLANT
SUT PERFECTPASSER WHITE CART (SUTURE) ×8 IMPLANT
SUT SMART STITCH CARTRIDGE (SUTURE) ×4 IMPLANT
SUT ULTRABRAID 2 COBRAID 38 (SUTURE) IMPLANT
SUTURE MAGNUM WIRE 2X48 BLK (SUTURE) IMPLANT
SYR 10ML LL (SYRINGE) ×2 IMPLANT
TAPE MICROFOAM 4IN (TAPE) ×2 IMPLANT
TUBING ARTHRO INFLOW-ONLY STRL (TUBING) ×2 IMPLANT
TUBING CONNECTING 10 (TUBING) ×2 IMPLANT
WAND HAND CNTRL MULTIVAC 90 (MISCELLANEOUS) ×2 IMPLANT
WRAPON POLAR PAD SHDR XLG (MISCELLANEOUS) ×2

## 2020-02-27 NOTE — Anesthesia Procedure Notes (Signed)
Procedure Name: Intubation Date/Time: 02/27/2020 10:10 AM Performed by: Gentry Fitz, CRNA Pre-anesthesia Checklist: Patient identified, Emergency Drugs available, Suction available and Patient being monitored Patient Re-evaluated:Patient Re-evaluated prior to induction Oxygen Delivery Method: Circle system utilized Preoxygenation: Pre-oxygenation with 100% oxygen Induction Type: IV induction Ventilation: Mask ventilation without difficulty Laryngoscope Size: McGraph and 3 Grade View: Grade I Tube type: Oral Tube size: 6.5 mm Number of attempts: 1 Airway Equipment and Method: Stylet Placement Confirmation: ETT inserted through vocal cords under direct vision,  positive ETCO2 and breath sounds checked- equal and bilateral Secured at: 18 cm Tube secured with: Tape Dental Injury: Teeth and Oropharynx as per pre-operative assessment

## 2020-02-27 NOTE — Progress Notes (Signed)
Dr. Mack Guise stated to restart Eliquis and Plavix tomorrow 02/28/20.

## 2020-02-27 NOTE — Op Note (Signed)
02/27/2020  1:08 PM  PATIENT:  Sara [name]  69 y.o. female  PRE-OPERATIVE DIAGNOSIS:   High-grade partial-thickness rotator Cuff Tear of Right Shoulder  POST-OPERATIVE DIAGNOSIS: High-grade partial thickness rotator cuff tear right shoulder, with superior labral tear, biceps tendinosis, glenohumeral joint arthrosis, subacromial impingement with bursitis and acromioclavicular joint arthrosis  PROCEDURE:  Procedure(s): RIGHT SHOULDER ARTHROSCOPIC SUBACROMIAL DECOMPRESSION, DISTAL CLAVICLE EXCISION AND MINI-OPEN ROTATOR CUFF REPAIR WITH BICEPS TENODESIS  SURGEON:  Surgeon(s) and Role:    Thornton Park, MD - Primary  ANESTHESIA:   general and paracervical block   PREOPERATIVE INDICATIONS:  Sara [name] is a  69 y.o. female with a diagnosis of high-grade partial-thickness rotator Cuff Tear of Right Shoulder confirmed by MRI, who failed conservative treatment and elected for surgical management.    The risks benefits and alternatives were discussed with the patient preoperatively including but not limited to the risks of infection, bleeding, nerve injury, persistent pain or weakness, shoulder stiffness/arthrofibrosis, failure of the repair, re-tear of the rotator cuff and the need for further surgery. Medical risks include DVT and pulmonary embolism, myocardial infarction, stroke, pneumonia, respiratory failure and death. Patient understood these risks and wished to proceed.  OPERATIVE IMPLANTS: [employer] Multifix anchors x 2 & [employer] Q Fix anchors x 1  OPERATIVE FINDINGS:  1.  High-grade partial-thickness tear of the supraspinatus 2.  Superior labral tear with biceps tendinosis 3.  Fraying of the subscapularis without tear 4.  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 The right shoulder was signed with the word yes and my initials according the  hospital's correct site of surgery protocol.   A pre-op history and physical was performed at the bedside.   Patient underwent an interscalene block with Exparel by the anesthesia service.  Patient was brought to the operating room where she underwent general anesthesia.  The patient was placed in a beachchair position.  A spider arm positioner was used for this case.  Examination under anesthesia revealed no loss of passive range of motion or instability with load shift testing. The patient had a negative sulcus sign.  Patient was prepped and draped in a sterile fashion. A timeout was performed to verify the patient's name, date of birth, medical record number, correct site of surgery and correct procedure to be performed there was also used to verify the patient received antibiotics that all appropriate instruments, implants and radiographs studies were available in the room. Once all in attendance were in agreement case began.  Patient received ancef 2 grams IV for pre-op antibiotics.  Bony landmarks were drawn out with a surgical marker along with proposed arthroscopy incisions.  An 11 blade was used to establish a posterior portal through which the arthroscope was placed in the glenohumeral joint. A full diagnostic examination of the shoulder was performed.  Findings on arthroscopy are noted above.  Patient had a 0 PDS placed through the supraspinatus at the site of the high-grade partial thickness tear.  The arthroscope was then placed in the subacromial space.  Extensive bursitis was encountered and debrided using a 4-0 resector shaver blade and a 90 ArthroCare wand from a lateral portal which was established under direct visualization using an 18-gauge spinal needle. A subacromial decompression was performed sing a 5.5 mm resector shaver blade from the lateral portal.   A distal clavicle excision was then performed through the anterior  portal also using the 5.5 mm resector shaver blade.  0 PDS suture  was then identified.  The high-grade partial-thickness tear was completed with a 4.0 mm resector shaver blade.    The arthroscope was then placed back in the glenohumeral joint.  Through the rotator cuff tear, 2 perfect Pass sutures were placed in the biceps tendon and a biceps tenotomy was performed with a 90 degree ArthroCare wand.  The sutures were clamped with a hemostat for later biceps tenodesis.  The arthroscope was then placed back in the subacromial space where a perfect pass suture was placed in the lateral border of the rotator cuff tear.  All arthroscopic instruments were then removed after final images were taken.    A saber-type incision was made along the lateral border of the acromion. The deltoid muscle was identified and split in line with its fibers which allowed visualization of the rotator cuff. The Perfect Pass suture previously placed in the lateral border of the rotator cuff was brought out through the deltoid split.  A Smith & Nephew multi fix anchor was used to perform a biceps tenodesis.  This anchor was placed at the top of the intratuberous groove.  A single [employer] anchor was placed at the articular margin of the humeral head with the greater tuberosity. The suture limbs of the Q Fix anchors were passed medially through the rotator cuff using a First Pass suture passer.     Two additional Perfect Pass sutures were then placed in the lateral border of the rotator cuff.  The 3 perfect Pass sutures in the lateral border of the rotator cuff were then anchored to the greater tuberosity footprint using a single [employer] Multifix anchor. The sutures passed through the Multifix anchor were then tensioned to allow reduction of the rotator cuff to the greater tuberosity footprint. The medial row repair was then performed by tying down the suture limbs of the previously placed Q fix anchor, which were tied down using an arthroscopic knot tying technique.  Arthroscopic  images of the repair were taken with the arthroscope both externally and from inside the glenohumeral joint.  All incisions were copiously irrigated. The deltoid fascia was repaired using a 0 Vicryl suture.  The subcutaneous tissue of all incisions were closed with a 2-0 Vicryl. Skin closure for the arthroscopic incisions was performed with 4-0 nylon. The skin edges of the saber incision was approximated with a running 4-0 undyed Monocryl.  A dry sterile dressing was applied.  The patient was placed in an abduction sling and a Polar Care was applied to the shoulder.  All sharp, sponge and it instrument counts were correct at the conclusion of the case. I was scrubbed and present for the entire case. I spoke with the patient's brother-in-law by phone from the PACU to let him know the case had been performed without complication and the patient was stable in recovery room.

## 2020-02-27 NOTE — Anesthesia Procedure Notes (Signed)
Anesthesia Regional Block: Interscalene brachial plexus block   Pre-Anesthetic Checklist: ,, timeout performed, Correct Patient, Correct Site, Correct Laterality, Correct Procedure, Correct Position, site marked, Risks and benefits discussed,  Surgical consent,  Pre-op evaluation,  At surgeon's request and post-op pain management  Laterality: Right  Prep: Dura Prep       Needles:  Injection technique: Single-shot  Needle Type: Echogenic Needle     Needle Length: 4cm  Needle Gauge: 25     Additional Needles:   Narrative:  Start time: 02/27/2020 8:19 AM End time: 02/27/2020 8:21 AM Injection made incrementally with aspirations every 5 mL.  Performed by: Personally  Anesthesiologist: Arita Miss, MD  Additional Notes: Patient consented for risk and benefits of nerve block including but not limited to: nerve damage, failed block, bleeding and infection, hemidiaphragmatic paralysis and shortness of breath, Horner's syndrome.  Patient voiced understanding.  Functioning IV was confirmed and monitors were applied.  Sterile prep,hand hygiene and sterile gloves were used.  Minimal sedation used for procedure.   No paresthesia endorsed by patient during the procedure.  Negative aspiration and negative test dose prior to incremental administration of local anesthetic. The patient tolerated the procedure well with no immediate complications.

## 2020-02-27 NOTE — Anesthesia Preprocedure Evaluation (Signed)
Anesthesia Evaluation  Patient identified by MRN, date of birth, ID band Patient awake    Reviewed: Allergy & Precautions, H&P , NPO status , Patient's Chart, lab work & pertinent test results  History of Anesthesia Complications Negative for: history of anesthetic complications  Airway Mallampati: III  TM Distance: >3 FB Neck ROM: full    Dental  (+) Upper Dentures, Dental Advisory Given   Pulmonary neg shortness of breath, neg COPD, neg recent URI, Not current smoker, former smoker,    breath sounds clear to auscultation       Cardiovascular hypertension, (-) angina+ CAD and + Peripheral Vascular Disease  (-) Past MI and (-) Cardiac Stents (-) dysrhythmias  Rhythm:Regular Rate:Normal - Systolic murmurs 9450: 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, healing well   Neuro/Psych PSYCHIATRIC DISORDERS Depression negative neurological ROS     GI/Hepatic Neg liver ROS, hiatal hernia, PUD, GERD  Controlled,  Endo/Other  negative endocrine ROS  Renal/GU negative Renal ROS  negative genitourinary   Musculoskeletal  (+) Arthritis ,   Abdominal   Peds  Hematology negative hematology ROS (+)   Anesthesia Other Findings Past Medical History: No date: Arthritis     Comment:  right shoulder No date: Depression No date: GERD (gastroesophageal reflux disease) No date: History of blood clots No date: Hyperlipidemia No date: Hypertension No date: Mild mitral regurgitation No date: Osteoporosis No date: Stomach ulcer No date: Vascular disease     Comment:  Sees Dr. Delana Meyer No date: Vertigo     Comment:  Last episode approx Aug 2015 No date: Wears dentures     Comment:  full upper  Past Surgical History: No date: ADENOIDECTOMY No date: BREAST CYST ASPIRATION; Left 02/15/2017: CARDIAC CATHETERIZATION     Comment:  [facility] 10/22/2017: COLONOSCOPY WITH PROPOFOL; N/A     Comment:   Procedure: COLONOSCOPY WITH PROPOFOL;  Surgeon: Lucilla Lame, MD;  Location: [place];  Service:               Endoscopy;  Laterality: N/A;  specimens not taken--pt on               Plavix will be brought back in after 7 days off med 11/05/2017: COLONOSCOPY WITH PROPOFOL; N/A     Comment:  Procedure: COLONOSCOPY WITH PROPOFOL;  Surgeon: Lucilla Lame, MD;  Location: [place];  Service:               Endoscopy;  Laterality: N/A; 12/21/2014: ESOPHAGOGASTRODUODENOSCOPY; N/A     Comment:  Procedure: ESOPHAGOGASTRODUODENOSCOPY (EGD);  Surgeon:               Lucilla Lame, MD;  Location: [place];                Service: Gastroenterology;  Laterality: N/A; 02/08/2016: ESOPHAGOGASTRODUODENOSCOPY (EGD) WITH PROPOFOL; N/A     Comment:  Procedure: ESOPHAGOGASTRODUODENOSCOPY (EGD) WITH               PROPOFOL;  Surgeon: Lucilla Lame, MD;  Location: [facility]               ENDOSCOPY;  Service: Endoscopy;  Laterality: N/A; 05/30/2019: FASCIOTOMY; Right     Comment:  Procedure: FASCIOTOMY;  Surgeon: [name], MD;  Location: [facility];  Service: Vascular;  Laterality:               Right; 06/04/2019: FASCIOTOMY CLOSURE; Right     Comment:  Procedure: FASCIOTOMY CLOSURE;  Surgeon: [name], MD;  Location: [facility];  Service: Vascular;                Laterality: Right; 2014: HEMORROIDECTOMY 05/30/2019: LOWER EXTREMITY ANGIOGRAPHY; Right     Comment:  Procedure: LOWER EXTREMITY ANGIOGRAPHY;  Surgeon:               [name], MD;  Location: [place] [facility];               Service: Cardiovascular;  Laterality: Right; 02/09/2016: PERIPHERAL VASCULAR CATHETERIZATION; N/A     Comment:  Procedure: Abdominal Aortogram w/Lower Extremity;                Surgeon: [name], MD;  Location: [place]               CV [facility];  Service: Cardiovascular;  Laterality: N/A; 02/10/2016: PERIPHERAL VASCULAR  CATHETERIZATION; Right     Comment:  Procedure: Lower Extremity Angiography;  Surgeon:               [name], MD;  Location: [place] [facility];                Service: Cardiovascular;  Laterality: Right; 11/05/2017: POLYPECTOMY     Comment:  Procedure: POLYPECTOMY;  Surgeon: Lucilla Lame, MD;                Location: [place];  Service: Endoscopy;; No date: TONSILLECTOMY 4920,1007: VASCULAR SURGERY     Comment:  Fem-Pop Bypass     Reproductive/Obstetrics negative OB ROS                             Anesthesia Physical  Anesthesia Plan  ASA: III  Anesthesia Plan: General   Post-op Pain Management: GA combined w/ Regional for post-op pain   Induction: Intravenous  PONV Risk Score and Plan: 4 or greater and Midazolam, Dexamethasone and Ondansetron  Airway Management Planned: Oral ETT  Additional Equipment: None  Intra-op Plan:   Post-operative Plan: Extubation in OR  Informed Consent: I have reviewed the patients History and Physical, chart, labs and discussed the procedure including the risks, benefits and alternatives for the proposed anesthesia with the patient or authorized representative who has indicated his/her understanding and acceptance.     Dental Advisory Given  Plan Discussed with: Anesthesiologist  Anesthesia Plan Comments: (Discussed risks of anesthesia with patient, including PONV, sore throat, lip/dental damage. Rare risks discussed as well, such as cardiorespiratory and neurological sequelae. Patient understands. Discussed r/b/a of interscalene block, including elective nature. Risks discussed: - Rare: bleeding, infection, nerve damage - shortness of breath from hemidiaphragmatic paralysis - unilateral horner's syndrome - poor/non-working blocks Patient understands and agrees. )        Anesthesia Quick Evaluation

## 2020-02-27 NOTE — H&P (Signed)
PREOPERATIVE H&P  Chief Complaint: Right shoulder rotator cuff tear  HPI: Sara [name] is a 69 y.o. 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 Symptoms of pain, weakness and limited range of motion are significantly impairing activities of daily living.  She has failed nonoperative management.  MRI has revealed a high-grade partial-thickness tear of the rotator cuff with AC joint arthrosis and evidence of impingement. She has agreed with surgical management.   Past Medical History:  Diagnosis Date  . Arthritis    right shoulder  . Depression   . Dyspnea   . GERD (gastroesophageal reflux disease)   . History of blood clots   . Hyperlipidemia   . Hypertension   . Mild mitral regurgitation   . Osteoporosis   . Stomach ulcer   . Vascular disease    Sees Dr. Delana Meyer  . Vertigo    Last episode approx Aug 2015  . Wears dentures    full upper   Past Surgical History:  Procedure Laterality Date  . ADENOIDECTOMY    . ARTERY BIOPSY Right 07/14/2019   Procedure: BIOPSY TEMPORAL ARTERY;  Surgeon: [name], MD;  Location: [facility];  Service: Vascular;  Laterality: Right;  . BREAST CYST ASPIRATION Left   . CARDIAC CATHETERIZATION  02/15/2017   [facility]  . COLONOSCOPY WITH PROPOFOL N/A 10/22/2017   Procedure: COLONOSCOPY WITH PROPOFOL;  Surgeon: Lucilla Lame, MD;  Location: [place];  Service: Endoscopy;  Laterality: N/A;  specimens not taken--pt on Plavix will be brought back in after 7 days off med  . COLONOSCOPY WITH PROPOFOL N/A 11/05/2017   Procedure: COLONOSCOPY WITH PROPOFOL;  Surgeon: Lucilla Lame, MD;  Location: [place];  Service: Endoscopy;  Laterality: N/A;  . ESOPHAGOGASTRODUODENOSCOPY N/A 12/21/2014   Procedure: ESOPHAGOGASTRODUODENOSCOPY (EGD);  Surgeon: Lucilla Lame, MD;  Location: [place];  Service: Gastroenterology;  Laterality: N/A;  .  ESOPHAGOGASTRODUODENOSCOPY (EGD) WITH PROPOFOL N/A 02/08/2016   Procedure: ESOPHAGOGASTRODUODENOSCOPY (EGD) WITH PROPOFOL;  Surgeon: Lucilla Lame, MD;  Location: [facility];  Service: Endoscopy;  Laterality: N/A;  . FASCIOTOMY Right 05/30/2019   Procedure: FASCIOTOMY;  Surgeon: [name], MD;  Location: [facility];  Service: Vascular;  Laterality: Right;  . FASCIOTOMY CLOSURE Right 06/04/2019   Procedure: FASCIOTOMY CLOSURE;  Surgeon: [name], MD;  Location: [facility];  Service: Vascular;  Laterality: Right;  . HEMORROIDECTOMY  2014  . LOWER EXTREMITY ANGIOGRAPHY Right 05/30/2019   Procedure: LOWER EXTREMITY ANGIOGRAPHY;  Surgeon: [name], MD;  Location: [place] [facility];  Service: Cardiovascular;  Laterality: Right;  . PERIPHERAL VASCULAR CATHETERIZATION N/A 02/09/2016   Procedure: Abdominal Aortogram w/Lower Extremity;  Surgeon: [name], MD;  Location: [place] [facility];  Service: Cardiovascular;  Laterality: N/A;  . PERIPHERAL VASCULAR CATHETERIZATION Right 02/10/2016   Procedure: Lower Extremity Angiography;  Surgeon: [name], MD;  Location: [place] [facility];  Service: Cardiovascular;  Laterality: Right;  . POLYPECTOMY  11/05/2017   Procedure: POLYPECTOMY;  Surgeon: Lucilla Lame, MD;  Location: [place];  Service: Endoscopy;;  . TONSILLECTOMY    . VASCULAR SURGERY  5027,7412   Fem-Pop Bypass   Social History   Socioeconomic History  . Marital status: Single    Spouse name: Not on file  . Number of children: Not on file  . Years of education: Not on file  . Highest education level: Not on file  Occupational History  . Not on file  Tobacco Use  . Smoking status: Former Smoker    Packs/day: 2.00    Years: 25.00    Pack years: 50.00    Types: Cigarettes    Quit date: 08/22/1991    Years since quitting: 28.5  . Smokeless tobacco: Never Used  Vaping Use  . Vaping Use: Never used  Substance and Sexual Activity  .  Alcohol use: No    Alcohol/week: 0.0 standard drinks  . Drug use: Never  . Sexual activity: Not on file  Other Topics Concern  . Not on file  Social History Narrative  . Not on file   Social Determinants of Health   Financial Resource Strain:   . Difficulty of Paying Living Expenses:   Food Insecurity:   . Worried About Charity fundraiser in the Last Year:   . Arboriculturist in the Last Year:   Transportation Needs:   . Film/video editor (Medical):   Marland Kitchen Lack of Transportation (Non-Medical):   Physical Activity:   . Days of Exercise per Week:   . Minutes of Exercise per Session:   Stress:   . Feeling of Stress :   Social Connections:   . Frequency of Communication with Friends and Family:   . Frequency of Social Gatherings with Friends and Family:   . Attends Religious Services:   . Active Member of Clubs or Organizations:   . Attends Archivist Meetings:   Marland Kitchen Marital Status:    Family History  Problem Relation Age of Onset  . CVA Mother   . Heart attack Mother   . Cancer Father        colon cancer  . Colon cancer Father   . Heart disease Maternal Uncle   . Breast cancer Neg Hx    Allergies  Allergen Reactions  . Amoxicillin Other (See Comments)    Yeast infection  . Metoprolol Tartrate Rash  . Oxycodone Itching  . Vicodin [Hydrocodone-Acetaminophen] Hives and Rash    Flushing   Prior to Admission medications   Medication Sig Start Date End Date Taking? Authorizing Provider  apixaban (ELIQUIS) 5 MG TABS tablet Take 1 tablet (5 mg total) by mouth 2 (two) times daily. 09/03/19  Yes Kris Hartmann, NP  atorvastatin (LIPITOR) 40 MG tablet Take 40 mg by mouth at bedtime.  12/08/16  Yes [provider]  calcium carbonate (OSCAL) 1500 (600 Ca) MG TABS tablet Take 1,200 mg of elemental calcium by mouth 2 (two) times daily with a meal.   Yes [provider]  cholecalciferol (VITAMIN D3) 25 MCG (1000 UNIT) tablet Take 1,000 Units by mouth  daily.   Yes [provider]  clopidogrel (PLAVIX) 75 MG tablet TAKE 1 TABLET DAILY Patient taking differently: Take 75 mg by mouth daily.  01/28/20  Yes Schnier, Dolores Lory, MD  diltiazem (CARDIZEM CD) 240 MG 24 hr capsule Take 240 mg by mouth daily. 01/05/20  Yes [provider]  ibandronate (BONIVA) 150 MG tablet Take 150 mg by mouth every 30 (thirty) days.  12/07/16  Yes [provider]  losartan (COZAAR) 25 MG tablet Take 25 mg by mouth every morning.  12/01/16  Yes [provider]  pantoprazole (PROTONIX) 40 MG tablet Take 1 tablet (40 mg total) by mouth daily. 12/23/19  Yes Lucilla Lame, MD  Tocilizumab (ACTEMRA ACTPEN) 162 MG/0.9ML SOAJ Inject 162 mg into the skin once a week.    Yes  [provider]  traMADol (ULTRAM) 50 MG tablet Take 50 mg by mouth every 4 (four) hours.    Yes [provider]  venlafaxine XR (EFFEXOR-XR) 150 MG 24 hr capsule Take 150 mg by mouth daily with breakfast.   Yes [provider]  zolpidem (AMBIEN) 5 MG tablet Take 5 mg by mouth at bedtime as needed for sleep.  07/03/19  Yes [provider]     Positive ROS: All other systems have been reviewed and were otherwise negative with the exception of those mentioned in the HPI and as above.  Physical Exam: General: Alert, no acute distress Cardiovascular: Regular rate and rhythm, no murmurs rubs or gallops.  No pedal edema Respiratory: Clear to auscultation bilaterally, no wheezes rales or rhonchi. No cyanosis, no use of accessory musculature GI: No organomegaly, abdomen is soft and non-tender nondistended with positive bowel sounds. Skin: Skin intact, no lesions within the operative field. Neurologic: Sensation intact distally Psychiatric: Patient is competent for consent with normal mood and affect Lymphatic: No cervical lymphadenopathy  MUSCULOSKELETAL: Right shoulder: Patient has pain with forward elevation and abduction at 90 degrees.  She has  pain with a downward directed force on her abducted shoulder and demonstrates weakness to shoulder abduction.  She has full digital wrist and elbow range of motion, intact/light touch and has a palpable radial pulse.  She has positive impingement signs but no apprehension or instability.  Assessment: High-grade partial thickness rotator Cuff Tear of Right Shoulder  Plan: Plan for Procedure(s): RIGHT SHOULDER ARTHROSCOPIC SUBACROMIAL DECOMPRESSION, DISTAL CLAVICLE EXCISION AND MINI-OPEN ROTATOR CUFF REPAIR  I reviewed the details of the operation as well as the postoperative course with the patient.  I discussed the risks and benefits of surgery. The risks include but are not limited to infection, bleeding, nerve or blood vessel injury, joint stiffness or loss of motion, persistent pain, weakness or instability, retear of the rotator cuff, failure of the repair or hardware failure and the need for further surgery. Medical risks include but are not limited to DVT and pulmonary embolism, myocardial infarction, stroke, pneumonia, respiratory failure and death. Patient understood these risks and wished to proceed.     Thornton Park, MD   02/27/2020 8:08 AM

## 2020-02-27 NOTE — Discharge Instructions (Signed)
Interscalene Nerve Block with Exparel  1.  For your surgery you have received an Interscalene Nerve Block with Exparel. 2. Nerve Blocks affect many types of nerves, including nerves that control movement, pain and normal sensation.  You may experience feelings such as numbness, tingling, heaviness, weakness or the inability to move your arm or the feeling or sensation that your arm has "fallen asleep". 3. A nerve block with Exparel can last up to 5 days.  Usually the weakness wears off first.  The tingling and heaviness usually wear off next.  Finally you may start to notice pain.  Keep in mind that this may occur in any order.  Once a nerve block starts to wear off it is usually completely gone within 60 minutes. 4. ISNB may cause mild shortness of breath, a hoarse voice, blurry vision, unequal pupils, or drooping of the face on the same side as the nerve block.  These symptoms will usually resolve with the numbness.  Very rarely the procedure itself can cause mild seizures. 5. If needed, your surgeon will give you a prescription for pain medication.  It will take about 60 minutes for the oral pain medication to become fully effective.  So, it is recommended that you start taking this medication before the nerve block first begins to wear off, or when you first begin to feel discomfort. 6. Take your pain medication only as prescribed.  Pain medication can cause sedation and decrease your breathing if you take more than you need for the level of pain that you have. 7. Nausea is a common side effect of many pain medications.  You may want to eat something before taking your pain medicine to prevent nausea. 8. After an Interscalene nerve block, you cannot feel pain, pressure or extremes in temperature in the effected arm.  Because your arm is numb it is at an increased risk for injury.  To decrease the possibility of injury, please practice the following:  a. While you are awake change the position of  your arm frequently to prevent too much pressure on any one area for prolonged periods of time. b.  If you have a cast or tight dressing, check the color or your fingers every couple of hours.  Call your surgeon with the appearance of any discoloration (white or blue). c. If you are given a sling to wear before you go home, please wear it  at all times until the block has completely worn off.  Do not get up at night without your sling. d. Please contact [facility] or your surgeon if you do not begin to regain sensation after 7 days from the surgery.  Anesthesia may be contacted by calling the Same Day Surgery Department, Mon. through Fri., 6 am to 4 pm at [phone].   e. If you experience any other problems or concerns, please contact your surgeon's office. If you experience severe or prolonged shortness of breath go to the nearest emergency department.  AMBULATORY SURGERY  DISCHARGE INSTRUCTIONS   1) The drugs that you were given will stay in your system until tomorrow so for the next 24 hours you should not:  A) Drive an automobile B) Make any legal decisions C) Drink any alcoholic beverage   2) You may resume regular meals tomorrow.  Today it is better to start with liquids and gradually work up to solid foods.  You may eat anything you prefer, but it is better to start with liquids, then soup and crackers,  and gradually work up to solid foods.   3) Please notify your doctor immediately if you have any unusual bleeding, trouble breathing, redness and pain at the surgery site, drainage, fever, or pain not relieved by medication.    4) Additional Instructions:   Please contact your physician with any problems or Same Day Surgery at [phone], Monday through Friday 6 am to 4 pm, or [place] at [facility] number at [phone].

## 2020-02-27 NOTE — Anesthesia Postprocedure Evaluation (Signed)
Anesthesia Post Note  Patient: Sara [name]  Procedure(s) Performed: RIGHT SHOULDER ARTHROSCOPY SUBACROMIAL DECOMPRESSION, DISTAL CLAVICLE EXCISION AND MINI-OPEN ROTATOR CUFF REPAIR (Right )  Patient location during evaluation: PACU Anesthesia Type: Regional and General Level of consciousness: awake and alert Pain management: pain level controlled Vital Signs Assessment: post-procedure vital signs reviewed and stable Respiratory status: spontaneous breathing, nonlabored ventilation, respiratory function stable and patient connected to nasal cannula oxygen Cardiovascular status: blood pressure returned to baseline and stable Postop Assessment: no apparent nausea or vomiting Anesthetic complications: no   No complications documented.   Last Vitals:  Vitals:   02/27/20 1323 02/27/20 1338  BP: 117/62 (!) 121/53  Pulse: 89 88  Resp: 14   Temp: (!) 36.1 C (!) 36.1 C  SpO2: 92% 95%    Last Pain:  Vitals:   02/27/20 1338  TempSrc: Tympanic  PainSc: 0-No pain                 Arita Miss

## 2020-02-27 NOTE — Transfer of Care (Signed)
Immediate Anesthesia Transfer of Care Note  Patient: Sara [name]  Procedure(s) Performed: RIGHT SHOULDER ARTHROSCOPY SUBACROMIAL DECOMPRESSION, DISTAL CLAVICLE EXCISION AND MINI-OPEN ROTATOR CUFF REPAIR (Right )  Patient Location: PACU  Anesthesia Type:General and Regional  Level of Consciousness: awake, drowsy and patient cooperative  Airway & Oxygen Therapy: Patient Spontanous Breathing and Patient connected to face mask oxygen  Post-op Assessment: Report given to RN and Post -op Vital signs reviewed and stable  Post vital signs: Reviewed and stable  Last Vitals:  Vitals Value Taken Time  BP 127/66 02/27/20 1253  Temp    Pulse 80 02/27/20 1256  Resp    SpO2 100 % 02/27/20 1256  Vitals shown include unvalidated device data.  Last Pain:  Vitals:   02/27/20 1253  PainSc: (P) 0-No pain      Patients Stated Pain Goal: 0 (61/48/30 7354)  Complications: No complications documented.

## 2020-03-26 ENCOUNTER — Other Ambulatory Visit: Payer: Self-pay | Admitting: Gastroenterology

## 2020-04-12 ENCOUNTER — Ambulatory Visit (INDEPENDENT_AMBULATORY_CARE_PROVIDER_SITE_OTHER): Payer: Medicare Other

## 2020-04-12 ENCOUNTER — Ambulatory Visit (INDEPENDENT_AMBULATORY_CARE_PROVIDER_SITE_OTHER): Payer: Medicare Other | Admitting: Vascular Surgery

## 2020-04-12 ENCOUNTER — Other Ambulatory Visit: Payer: Self-pay

## 2020-04-12 ENCOUNTER — Encounter (INDEPENDENT_AMBULATORY_CARE_PROVIDER_SITE_OTHER): Payer: Self-pay | Admitting: Vascular Surgery

## 2020-04-12 VITALS — BP 127/71 | HR 83 | Ht 63.0 in | Wt 123.0 lb

## 2020-04-12 DIAGNOSIS — I70213 Atherosclerosis of native arteries of extremities with intermittent claudication, bilateral legs: Secondary | ICD-10-CM

## 2020-04-12 DIAGNOSIS — M316 Other giant cell arteritis: Secondary | ICD-10-CM

## 2020-04-12 DIAGNOSIS — I6523 Occlusion and stenosis of bilateral carotid arteries: Secondary | ICD-10-CM | POA: Diagnosis not present

## 2020-04-12 DIAGNOSIS — I779 Disorder of arteries and arterioles, unspecified: Secondary | ICD-10-CM | POA: Diagnosis not present

## 2020-04-12 DIAGNOSIS — E7849 Other hyperlipidemia: Secondary | ICD-10-CM

## 2020-04-12 DIAGNOSIS — K219 Gastro-esophageal reflux disease without esophagitis: Secondary | ICD-10-CM

## 2020-04-12 NOTE — Progress Notes (Signed)
MRN : [id number]  Sara [name] is a 69 y.o. (1951/04/26) female who presents with chief complaint of No chief complaint on file. 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  05/30/2019: 1. Percutaneous transluminal angioplastyand stent placement at 2 locations in therightfemoral below-knee popliteal bypass 2. Percutaneous transluminal angioplastyand stent placementrighttibioperoneal trunk  3. Mechanical thrombectomy using the penumbra CAT 6 right femoral popliteal bypass and tibioperoneal trunk and proximal peroneal. 4. 4 compartment fasciotomies right calf  The patient notes improvement in the lower extremity symptoms. No interval shortening of the patient's claudication distance or rest pain symptoms. Previous wounds have now healed. No new ulcers or wounds have occurred since the last visit.  There have been no significant changes to the patient's overall health care.  The patient denies amaurosis fugax or recent TIA symptoms. There are no recent neurological changes noted. The patient denies history of DVT, PE or superficial thrombophlebitis. The patient denies recent episodes of angina or shortness of breath.  ABI's Rt=1.13 and Lt=1.21 Duplex ultrasound of the right leg shows a widely patent arterial system including the stent and the bypass   No outpatient medications have been marked as taking for the 04/12/20 encounter (Appointment) with Delana Meyer, Dolores Lory, MD.    Past Medical History:  Diagnosis Date  . Arthritis    right shoulder  . Depression   . Dyspnea   . GERD (gastroesophageal reflux disease)   . History of blood clots   . Hyperlipidemia   . Hypertension   . Mild mitral regurgitation   . Osteoporosis   . Stomach ulcer   .  Vascular disease    Sees Dr. Delana Meyer  . Vertigo    Last episode approx Aug 2015  . Wears dentures    full upper    Past Surgical History:  Procedure Laterality Date  . ADENOIDECTOMY    . ARTERY BIOPSY Right 07/14/2019   Procedure: BIOPSY TEMPORAL ARTERY;  Surgeon: [name], MD;  Location: [facility];  Service: Vascular;  Laterality: Right;  . BREAST CYST ASPIRATION Left   . CARDIAC CATHETERIZATION  02/15/2017   [facility]  . COLONOSCOPY WITH PROPOFOL N/A 10/22/2017   Procedure: COLONOSCOPY WITH PROPOFOL;  Surgeon: Lucilla Lame, MD;  Location: [place];  Service: Endoscopy;  Laterality: N/A;  specimens not taken--pt on Plavix will be brought back in after 7 days off med  . COLONOSCOPY WITH PROPOFOL N/A 11/05/2017   Procedure: COLONOSCOPY WITH PROPOFOL;  Surgeon: Lucilla Lame, MD;  Location: [place];  Service: Endoscopy;  Laterality: N/A;  . ESOPHAGOGASTRODUODENOSCOPY N/A 12/21/2014   Procedure: ESOPHAGOGASTRODUODENOSCOPY (EGD);  Surgeon: Lucilla Lame, MD;  Location: [place];  Service: Gastroenterology;  Laterality: N/A;  . ESOPHAGOGASTRODUODENOSCOPY (EGD) WITH PROPOFOL N/A 02/08/2016   Procedure: ESOPHAGOGASTRODUODENOSCOPY (EGD) WITH PROPOFOL;  Surgeon: Lucilla Lame, MD;  Location: [facility];  Service: Endoscopy;  Laterality: N/A;  . FASCIOTOMY Right 05/30/2019   Procedure: FASCIOTOMY;  Surgeon: [name], MD;  Location: [facility];  Service: Vascular;  Laterality: Right;  . FASCIOTOMY CLOSURE Right 06/04/2019   Procedure: FASCIOTOMY CLOSURE;  Surgeon: [name], MD;  Location: [facility];  Service: Vascular;  Laterality: Right;  . HEMORROIDECTOMY  2014  . LOWER EXTREMITY ANGIOGRAPHY Right 05/30/2019   Procedure: LOWER EXTREMITY ANGIOGRAPHY;  Surgeon: [name], MD;  Location:  [place] [facility];  Service: Cardiovascular;  Laterality: Right;  . PERIPHERAL VASCULAR CATHETERIZATION N/A 02/09/2016   Procedure: Abdominal Aortogram  w/Lower Extremity;  Surgeon: [name], MD;  Location: [place] [facility];  Service: Cardiovascular;  Laterality: N/A;  . PERIPHERAL VASCULAR CATHETERIZATION Right 02/10/2016   Procedure: Lower Extremity Angiography;  Surgeon: [name], MD;  Location: [place] [facility];  Service: Cardiovascular;  Laterality: Right;  . POLYPECTOMY  11/05/2017   Procedure: POLYPECTOMY;  Surgeon: Lucilla Lame, MD;  Location: [place];  Service: Endoscopy;;  . SHOULDER ARTHROSCOPY WITH ROTATOR CUFF REPAIR AND SUBACROMIAL DECOMPRESSION Right 02/27/2020   Procedure: RIGHT SHOULDER ARTHROSCOPY SUBACROMIAL DECOMPRESSION, DISTAL CLAVICLE EXCISION AND MINI-OPEN ROTATOR CUFF REPAIR;  Surgeon: Thornton Park, MD;  Location: [facility];  Service: Orthopedics;  Laterality: Right;  . TONSILLECTOMY    . VASCULAR SURGERY  5009,3818   Fem-Pop Bypass    Social History Social History   Tobacco Use  . Smoking status: Former Smoker    Packs/day: 2.00    Years: 25.00    Pack years: 50.00    Types: Cigarettes    Quit date: 08/22/1991    Years since quitting: 28.6  . Smokeless tobacco: Never Used  Vaping Use  . Vaping Use: Never used  Substance Use Topics  . Alcohol use: No    Alcohol/week: 0.0 standard drinks  . Drug use: Never    Family History Family History  Problem Relation Age of Onset  . CVA Mother   . Heart attack Mother   . Cancer Father        colon cancer  . Colon cancer Father   . Heart disease Maternal Uncle   . Breast cancer Neg Hx     Allergies  Allergen Reactions  . Amoxicillin Other (See Comments)    Yeast infection  . Metoprolol Tartrate Rash  . Oxycodone Itching  . Vicodin [Hydrocodone-Acetaminophen] Hives and Rash    Flushing     REVIEW OF SYSTEMS (Negative unless checked)  Constitutional: [] Weight loss  [] Fever  [] Chills Cardiac: [] Chest pain   [] Chest pressure   [] Palpitations   [] Shortness of breath when laying flat   [] Shortness of breath with  exertion. Vascular:  [x] Pain in legs with walking   [] Pain in legs at rest  [] History of DVT   [] Phlebitis   [] Swelling in legs   [] Varicose veins   [] Non-healing ulcers Pulmonary:   [] Uses home oxygen   [] Productive cough   [] Hemoptysis   [] Wheeze  [] COPD   [] Asthma Neurologic:  [] Dizziness   [] Seizures   [] History of stroke   [] History of TIA  [] Aphasia   [] Vissual changes   [] Weakness or numbness in arm   [] Weakness or numbness in leg Musculoskeletal:   [] Joint swelling   [x] Joint pain   [] Low back pain Hematologic:  [] Easy bruising  [] Easy bleeding   [] Hypercoagulable state   [] Anemic Gastrointestinal:  [] Diarrhea   [] Vomiting  [] Gastroesophageal reflux/heartburn   [] Difficulty swallowing. Genitourinary:  [] Chronic kidney disease   [] Difficult urination  [] Frequent urination   [] Blood in urine Skin:  [] Rashes   [] Ulcers  Psychological:  [] History of anxiety   []  History of major depression.  Physical Examination  There were no vitals filed for this visit. There is no height or weight on file to calculate BMI. Gen: WD/WN, NAD Head: [place]/AT, No temporalis wasting.  Ear/Nose/Throat: Hearing grossly intact, nares w/o erythema or drainage Eyes: PER, EOMI, sclera nonicteric.  Neck: Supple, no large  masses.   Pulmonary:  Good air movement, no audible wheezing bilaterally, no use of accessory muscles.  Cardiac: RRR, no JVD Vascular:  Vessel Right Left  Radial Palpable Palpable  PT Trace Palpable Trace Palpable  DP Not Palpable Not Palpable  Gastrointestinal: Non-distended. No guarding/no peritoneal signs.  Musculoskeletal: M/S 5/5 throughout.  No deformity or atrophy.  Neurologic: CN 2-12 intact. Symmetrical.  Speech is fluent. Motor exam as listed above. Psychiatric: Judgment intact, Mood & affect appropriate for pt's clinical situation. Dermatologic: No rashes or ulcers noted.  No changes consistent with cellulitis.   CBC Lab Results  Component Value Date   WBC 4.3 02/18/2020   HGB  12.1 02/18/2020   HCT 38.9 02/18/2020   MCV 81.2 02/18/2020   PLT 227 02/18/2020    BMET    Component Value Date/Time   NA 139 02/18/2020 0949   NA 139 09/21/2015 0916   K 4.1 02/18/2020 0949   CL 102 02/18/2020 0949   CO2 28 02/18/2020 0949   GLUCOSE 85 02/18/2020 0949   BUN 20 02/18/2020 0949   BUN 12 09/21/2015 0916   CREATININE 0.97 02/18/2020 0949   CREATININE 0.88 05/16/2016 1034   CALCIUM 9.4 02/18/2020 0949   GFRNONAA >60 02/18/2020 0949   GFRNONAA 66 05/01/2016 0846   GFRAA >60 02/18/2020 0949   GFRAA 76 05/01/2016 0846   CrCl cannot be calculated (Patient's most recent lab result is older than the maximum 21 days allowed.).  COAG Lab Results  Component Value Date   INR 1.3 (H) 02/18/2020   INR 1.0 07/11/2019   INR 1.0 06/04/2019    Radiology No results found.    Assessment/Plan 1. Atherosclerosis of native artery of both lower extremities with intermittent claudication (HCC) Recommend:  The patient has evidence of atherosclerosis of the lower extremities with claudication.  The patient does not voice lifestyle limiting changes at this point in time.  Noninvasive studies do not suggest clinically significant change.  No invasive studies, angiography or surgery at this time The patient should continue walking and begin a more formal exercise program.  The patient should continue antiplatelet therapy and aggressive treatment of the lipid abnormalities  No changes in the patient's medications at this time  The patient should continue wearing graduated compression socks 10-15 mmHg strength to control the mild edema.  - VAS [place] ABI WITH/WO TBI; Future - VAS [place] LOWER EXTREMITY ARTERIAL DUPLEX; Future  2. Bilateral carotid artery stenosis Recommend:  The patient has evidence of atherosclerosis of the lower extremities with claudication.  The patient does not voice lifestyle limiting changes at this point in time.  Noninvasive studies do not  suggest clinically significant change.  No invasive studies, angiography or surgery at this time The patient should continue walking and begin a more formal exercise program.  The patient should continue antiplatelet therapy and aggressive treatment of the lipid abnormalities  No changes in the patient's medications at this time  The patient should continue wearing graduated compression socks 10-15 mmHg strength to control the mild edema.  - VAS US CAROTID; Future  3. GCA (giant cell arteritis) ([place]) Continue current therapy no changes  4. Other hyperlipidemia Continue statin as ordered and reviewed, no changes at this time   5. Gastroesophageal reflux disease, unspecified whether esophagitis present Continue PPI as already ordered, this medication has been reviewed and there are no changes at this time.  Avoidence of caffeine and alcohol  Moderate elevation of the head of the bed  [name], MD  04/12/2020 10:19 AM

## 2020-06-15 ENCOUNTER — Telehealth (INDEPENDENT_AMBULATORY_CARE_PROVIDER_SITE_OTHER): Payer: Self-pay

## 2020-06-15 ENCOUNTER — Ambulatory Visit (INDEPENDENT_AMBULATORY_CARE_PROVIDER_SITE_OTHER): Payer: Medicare Other

## 2020-06-15 ENCOUNTER — Other Ambulatory Visit: Payer: Self-pay

## 2020-06-15 ENCOUNTER — Other Ambulatory Visit (INDEPENDENT_AMBULATORY_CARE_PROVIDER_SITE_OTHER): Payer: Self-pay | Admitting: Nurse Practitioner

## 2020-06-15 DIAGNOSIS — M79604 Pain in right leg: Secondary | ICD-10-CM

## 2020-06-15 DIAGNOSIS — Z95828 Presence of other vascular implants and grafts: Secondary | ICD-10-CM

## 2020-06-15 NOTE — Telephone Encounter (Signed)
Patient left a voicemail stating that she having right leg pain and not able to walk far. The patient pain is located in the upper right leg that started 3 days ago. The patient stated that she had the same pain last year. I spoke with Eulogio Ditch NP and she recommended for the patient to come in for right le arterial duplex and see provider if ultrasound is positive

## 2020-06-16 NOTE — Telephone Encounter (Signed)
Patient called today stating there has been no changes since note 06/15/20. I spoke with Einar Pheasant (about [place] results) and Arna Medici bring her in with le ven reflux study to see if she has any blockages. This note is for documentation purposes only.

## 2020-06-17 ENCOUNTER — Other Ambulatory Visit (INDEPENDENT_AMBULATORY_CARE_PROVIDER_SITE_OTHER): Payer: Self-pay | Admitting: Nurse Practitioner

## 2020-06-17 ENCOUNTER — Ambulatory Visit (INDEPENDENT_AMBULATORY_CARE_PROVIDER_SITE_OTHER): Payer: Medicare Other | Admitting: Nurse Practitioner

## 2020-06-17 ENCOUNTER — Encounter (INDEPENDENT_AMBULATORY_CARE_PROVIDER_SITE_OTHER): Payer: Self-pay | Admitting: Nurse Practitioner

## 2020-06-17 ENCOUNTER — Ambulatory Visit (INDEPENDENT_AMBULATORY_CARE_PROVIDER_SITE_OTHER): Payer: Medicare Other

## 2020-06-17 ENCOUNTER — Other Ambulatory Visit: Payer: Self-pay

## 2020-06-17 VITALS — BP 103/63 | HR 90 | Resp 19 | Ht 63.0 in | Wt 127.0 lb

## 2020-06-17 DIAGNOSIS — I70213 Atherosclerosis of native arteries of extremities with intermittent claudication, bilateral legs: Secondary | ICD-10-CM

## 2020-06-17 DIAGNOSIS — I10 Essential (primary) hypertension: Secondary | ICD-10-CM | POA: Diagnosis not present

## 2020-06-17 DIAGNOSIS — M79604 Pain in right leg: Secondary | ICD-10-CM

## 2020-06-17 DIAGNOSIS — E7849 Other hyperlipidemia: Secondary | ICD-10-CM

## 2020-06-20 ENCOUNTER — Encounter (INDEPENDENT_AMBULATORY_CARE_PROVIDER_SITE_OTHER): Payer: Self-pay | Admitting: Nurse Practitioner

## 2020-06-20 NOTE — Progress Notes (Signed)
Subjective:    Patient ID: Sara [name], female    DOB: [birth year], 69 y.o.   MRN: [id number] Chief Complaint  Patient presents with  . Follow-up    Sara [name] is a 69 year old female that is well-known to our practice.  The patient has had a previous history of multiple vascular interventions as well as a right femoropopliteal bypass graft.  The patient contacted our office with right lower extremity pain that was consistent with pain of previous arterial occlusions.  The patient notes extensive pain with any ambulation.  She denies any extensive discoloration of her lower extremities however.  The patient has been adherent with her anticoagulation and antiplatelet medication.  She denies any fever, chills, nausea, vomiting or diarrhea.  Noninvasive studies today show no evidence of DVT seen in the right lower extremity.  Arterial duplex of the right lower extremity shows triphasic biphasic waveforms in the common femoral and deep femoral artery.  The graft has mostly biphasic waveforms until it reaches the distal popliteal area where it transitions to monophasic waveforms below the distal popliteal artery the patient has monophasic waveforms present.   Review of Systems  Cardiovascular:       Claudication  All other systems reviewed and are negative.      Objective:   Physical Exam Vitals reviewed.  HENT:     Head: Normocephalic.  Cardiovascular:     Rate and Rhythm: Normal rate.     Pulses:          Dorsalis pedis pulses are 1+ on the right side.       Posterior tibial pulses are 1+ on the right side.  Pulmonary:     Effort: Pulmonary effort is normal.  Neurological:     Mental Status: She is alert and oriented to person, place, and time.     Motor: Weakness present.  Psychiatric:        Mood and Affect: Mood normal.        Behavior: Behavior normal.        Thought Content: Thought content normal.        Judgment: Judgment normal.     BP 103/63 (BP Location: Left  Arm)   Pulse 90   Resp 19   Ht 5\' 3"  (1.6 m)   Wt 127 lb (57.6 kg)   BMI 22.50 kg/m   Past Medical History:  Diagnosis Date  . Arthritis    right shoulder  . Depression   . Dyspnea   . GERD (gastroesophageal reflux disease)   . History of blood clots   . Hyperlipidemia   . Hypertension   . Mild mitral regurgitation   . Osteoporosis   . Stomach ulcer   . Vascular disease    Sees Dr. Delana Meyer  . Vertigo    Last episode approx Aug 2015  . Wears dentures    full upper    Social History   Socioeconomic History  . Marital status: Single    Spouse name: Not on file  . Number of children: Not on file  . Years of education: Not on file  . Highest education level: Not on file  Occupational History  . Not on file  Tobacco Use  . Smoking status: Former Smoker    Packs/day: 2.00    Years: 25.00    Pack years: 50.00    Types: Cigarettes    Quit date: 08/22/1991    Years since quitting: 28.8  . Smokeless tobacco: Never Used  Vaping Use  . Vaping Use: Never used  Substance and Sexual Activity  . Alcohol use: No    Alcohol/week: 0.0 standard drinks  . Drug use: Never  . Sexual activity: Not on file  Other Topics Concern  . Not on file  Social History Narrative  . Not on file   Social Determinants of Health   Financial Resource Strain:   . Difficulty of Paying Living Expenses: Not on file  Food Insecurity:   . Worried About Charity fundraiser in the Last Year: Not on file  . Ran Out of Food in the Last Year: Not on file  Transportation Needs:   . Lack of Transportation (Medical): Not on file  . Lack of Transportation (Non-Medical): Not on file  Physical Activity:   . Days of Exercise per Week: Not on file  . Minutes of Exercise per Session: Not on file  Stress:   . Feeling of Stress : Not on file  Social Connections:   . Frequency of Communication with Friends and Family: Not on file  . Frequency of Social Gatherings with Friends and Family: Not on file  .  Attends Religious Services: Not on file  . Active Member of Clubs or Organizations: Not on file  . Attends Archivist Meetings: Not on file  . Marital Status: Not on file  Intimate Partner Violence:   . Fear of Current or Ex-Partner: Not on file  . Emotionally Abused: Not on file  . Physically Abused: Not on file  . Sexually Abused: Not on file    Past Surgical History:  Procedure Laterality Date  . ADENOIDECTOMY    . ARTERY BIOPSY Right 07/14/2019   Procedure: BIOPSY TEMPORAL ARTERY;  Surgeon: [name], MD;  Location: [facility];  Service: Vascular;  Laterality: Right;  . BREAST CYST ASPIRATION Left   . CARDIAC CATHETERIZATION  02/15/2017   [facility]  . COLONOSCOPY WITH PROPOFOL N/A 10/22/2017   Procedure: COLONOSCOPY WITH PROPOFOL;  Surgeon: Lucilla Lame, MD;  Location: [place];  Service: Endoscopy;  Laterality: N/A;  specimens not taken--pt on Plavix will be brought back in after 7 days off med  . COLONOSCOPY WITH PROPOFOL N/A 11/05/2017   Procedure: COLONOSCOPY WITH PROPOFOL;  Surgeon: Lucilla Lame, MD;  Location: [place];  Service: Endoscopy;  Laterality: N/A;  . ESOPHAGOGASTRODUODENOSCOPY N/A 12/21/2014   Procedure: ESOPHAGOGASTRODUODENOSCOPY (EGD);  Surgeon: Lucilla Lame, MD;  Location: [place];  Service: Gastroenterology;  Laterality: N/A;  . ESOPHAGOGASTRODUODENOSCOPY (EGD) WITH PROPOFOL N/A 02/08/2016   Procedure: ESOPHAGOGASTRODUODENOSCOPY (EGD) WITH PROPOFOL;  Surgeon: Lucilla Lame, MD;  Location: [facility];  Service: Endoscopy;  Laterality: N/A;  . FASCIOTOMY Right 05/30/2019   Procedure: FASCIOTOMY;  Surgeon: [name], MD;  Location: [facility];  Service: Vascular;  Laterality: Right;  . FASCIOTOMY CLOSURE Right 06/04/2019   Procedure: FASCIOTOMY CLOSURE;  Surgeon: [name], MD;  Location: [facility];  Service: Vascular;  Laterality: Right;  . HEMORROIDECTOMY  2014  . LOWER EXTREMITY ANGIOGRAPHY Right 05/30/2019     Procedure: LOWER EXTREMITY ANGIOGRAPHY;  Surgeon: [name], MD;  Location: [place] [facility];  Service: Cardiovascular;  Laterality: Right;  . PERIPHERAL VASCULAR CATHETERIZATION N/A 02/09/2016   Procedure: Abdominal Aortogram w/Lower Extremity;  Surgeon: [name], MD;  Location: [place] [facility];  Service: Cardiovascular;  Laterality: N/A;  . PERIPHERAL VASCULAR CATHETERIZATION Right 02/10/2016   Procedure: Lower Extremity Angiography;  Surgeon: [name], MD;  Location: [place] [facility];  Service: Cardiovascular;  Laterality: Right;  . POLYPECTOMY  11/05/2017   Procedure: POLYPECTOMY;  Surgeon: Lucilla Lame, MD;  Location: [place];  Service: Endoscopy;;  . SHOULDER ARTHROSCOPY WITH ROTATOR CUFF REPAIR AND SUBACROMIAL DECOMPRESSION Right 02/27/2020   Procedure: RIGHT SHOULDER ARTHROSCOPY SUBACROMIAL DECOMPRESSION, DISTAL CLAVICLE EXCISION AND MINI-OPEN ROTATOR CUFF REPAIR;  Surgeon: Thornton Park, MD;  Location: [facility];  Service: Orthopedics;  Laterality: Right;  . TONSILLECTOMY    . VASCULAR SURGERY  [id number]   Fem-Pop Bypass    Family History  Problem Relation Age of Onset  . CVA Mother   . Heart attack Mother   . Cancer Father        colon cancer  . Colon cancer Father   . Heart disease Maternal Uncle   . Breast cancer Neg Hx     Allergies  Allergen Reactions  . Amoxicillin Other (See Comments)    Yeast infection  . Metoprolol Tartrate Rash  . Oxycodone Itching  . Vicodin [Hydrocodone-Acetaminophen] Hives and Rash    Flushing    CBC Latest Ref Rng & Units 02/18/2020 07/03/2019 06/07/2019  WBC 4.0 - 10.5 K/uL 4.3 7.3 7.6  Hemoglobin 12.0 - 15.0 g/dL 12.1 11.9(L) 8.6(L)  Hematocrit 36 - 46 % 38.9 36.7 27.6(L)  Platelets 150 - 400 K/uL 227 263 252      CMP     Component Value Date/Time   NA 139 02/18/2020 0949   NA 139 09/21/2015 0916   K 4.1 02/18/2020 0949   CL 102 02/18/2020 0949   CO2 28 02/18/2020 0949    GLUCOSE 85 02/18/2020 0949   BUN 20 02/18/2020 0949   BUN 12 09/21/2015 0916   CREATININE 0.97 02/18/2020 0949   CREATININE 0.88 05/16/2016 1034   CALCIUM 9.4 02/18/2020 0949   PROT 4.4 (L) 06/01/2019 0352   PROT 6.8 09/21/2015 0916   ALBUMIN 2.2 (L) 06/01/2019 0352   ALBUMIN 4.2 09/21/2015 0916   AST 36 06/01/2019 0352   ALT 13 06/01/2019 0352   ALKPHOS 29 (L) 06/01/2019 0352   BILITOT 0.3 06/01/2019 0352   BILITOT 0.3 09/21/2015 0916   GFRNONAA >60 02/18/2020 0949   GFRNONAA 66 05/01/2016 0846   GFRAA >60 02/18/2020 0949   GFRAA 76 05/01/2016 0846         Assessment & Plan:   1. Atherosclerosis of native artery of both lower extremities with intermittent claudication (HCC) Recommend:  The patient has experienced increased symptoms and is now describing lifestyle limiting claudication and mild rest pain.   Given the severity of the patient's lower extremity symptoms the patient should undergo angiography and intervention.  Risk and benefits were reviewed the patient.  Indications for the procedure were reviewed.  All questions were answered, the patient agrees to proceed.   The patient should continue walking and begin a more formal exercise program.  The patient should continue antiplatelet therapy and aggressive treatment of the lipid abnormalities  The patient will follow up with me after the angiogram.   2. Other hyperlipidemia Continue statin as ordered and reviewed, no changes at this time   3. Essential hypertension Continue antihypertensive medications as already ordered, these medications have been reviewed and there are no changes at this time.    Current Outpatient Medications on File Prior to Visit  Medication Sig Dispense Refill  . apixaban (ELIQUIS) 5 MG TABS tablet Take 1 tablet (5 mg total) by mouth 2 (two) times daily. 8 tablet 0  .  atorvastatin (LIPITOR) 40 MG tablet Take 40 mg by mouth at bedtime.     . cholecalciferol (VITAMIN D3) 25 MCG (1000  UNIT) tablet Take 1,000 Units by mouth daily.    . clobetasol cream (TEMOVATE) 0.05 % Apply topically.    . clopidogrel (PLAVIX) 75 MG tablet TAKE 1 TABLET DAILY (Patient taking differently: Take 75 mg by mouth daily. ) 90 tablet 3  . diltiazem (CARDIZEM CD) 240 MG 24 hr capsule Take 240 mg by mouth daily.    Marland Kitchen ibandronate (BONIVA) 150 MG tablet Take 150 mg by mouth every 30 (thirty) days.     Marland Kitchen ketorolac (ACULAR) 0.5 % ophthalmic solution 1 drop 4 (four) times daily.    Marland Kitchen losartan (COZAAR) 25 MG tablet Take 25 mg by mouth every morning.     . pantoprazole (PROTONIX) 40 MG tablet TAKE 1 TABLET DAILY 90 tablet 1  . Tocilizumab (ACTEMRA ACTPEN) 162 MG/0.9ML SOAJ Inject 162 mg into the skin once a week.     . venlafaxine XR (EFFEXOR-XR) 150 MG 24 hr capsule Take 150 mg by mouth daily with breakfast.    . zolpidem (AMBIEN) 5 MG tablet Take 5 mg by mouth at bedtime as needed for sleep.     . calcium carbonate (OSCAL) 1500 (600 Ca) MG TABS tablet Take 1,200 mg of elemental calcium by mouth 2 (two) times daily with a meal.    . Calcium-Vitamin D-Vitamin K (CALCIUM+MENAQ7) 500-200-90 MG-UNT-MCG TABS Take by mouth.    . ondansetron (ZOFRAN) 4 MG tablet Take 1 tablet (4 mg total) by mouth every 8 (eight) hours as needed for nausea or vomiting. 30 tablet 0  . oxyCODONE (OXY IR/ROXICODONE) 5 MG immediate release tablet Take 1 tablet (5 mg total) by mouth every 4 (four) hours as needed. 40 tablet 0   No current facility-administered medications on file prior to visit.    There are no Patient Instructions on file for this visit. No follow-ups on file.   Kris Hartmann, NP

## 2020-06-20 NOTE — H&P (View-Only) (Signed)
Subjective:    Patient ID: Sara [name], female    DOB: 1950-10-05, 69 y.o.   MRN: [id number] Chief Complaint  Patient presents with  . Follow-up    Sara [name] is a 69 year old female that is well-known to our practice.  The patient has had a previous history of multiple vascular interventions as well as a right femoropopliteal bypass graft.  The patient contacted our office with right lower extremity pain that was consistent with pain of previous arterial occlusions.  The patient notes extensive pain with any ambulation.  She denies any extensive discoloration of her lower extremities however.  The patient has been adherent with her anticoagulation and antiplatelet medication.  She denies any fever, chills, nausea, vomiting or diarrhea.  Noninvasive studies today show no evidence of DVT seen in the right lower extremity.  Arterial duplex of the right lower extremity shows triphasic biphasic waveforms in the common femoral and deep femoral artery.  The graft has mostly biphasic waveforms until it reaches the distal popliteal area where it transitions to monophasic waveforms below the distal popliteal artery the patient has monophasic waveforms present.   Review of Systems  Cardiovascular:       Claudication  All other systems reviewed and are negative.      Objective:   Physical Exam Vitals reviewed.  HENT:     Head: Normocephalic.  Cardiovascular:     Rate and Rhythm: Normal rate.     Pulses:          Dorsalis pedis pulses are 1+ on the right side.       Posterior tibial pulses are 1+ on the right side.  Pulmonary:     Effort: Pulmonary effort is normal.  Neurological:     Mental Status: She is alert and oriented to person, place, and time.     Motor: Weakness present.  Psychiatric:        Mood and Affect: Mood normal.        Behavior: Behavior normal.        Thought Content: Thought content normal.        Judgment: Judgment normal.     BP 103/63 (BP Location: Left  Arm)   Pulse 90   Resp 19   Ht 5\' 3"  (1.6 m)   Wt 127 lb (57.6 kg)   BMI 22.50 kg/m   Past Medical History:  Diagnosis Date  . Arthritis    right shoulder  . Depression   . Dyspnea   . GERD (gastroesophageal reflux disease)   . History of blood clots   . Hyperlipidemia   . Hypertension   . Mild mitral regurgitation   . Osteoporosis   . Stomach ulcer   . Vascular disease    Sees Dr. Delana Meyer  . Vertigo    Last episode approx Aug 2015  . Wears dentures    full upper    Social History   Socioeconomic History  . Marital status: Single    Spouse name: Not on file  . Number of children: Not on file  . Years of education: Not on file  . Highest education level: Not on file  Occupational History  . Not on file  Tobacco Use  . Smoking status: Former Smoker    Packs/day: 2.00    Years: 25.00    Pack years: 50.00    Types: Cigarettes    Quit date: 08/22/1991    Years since quitting: 28.8  . Smokeless tobacco: Never Used  Vaping Use  . Vaping Use: Never used  Substance and Sexual Activity  . Alcohol use: No    Alcohol/week: 0.0 standard drinks  . Drug use: Never  . Sexual activity: Not on file  Other Topics Concern  . Not on file  Social History Narrative  . Not on file   Social Determinants of Health   Financial Resource Strain:   . Difficulty of Paying Living Expenses: Not on file  Food Insecurity:   . Worried About Charity fundraiser in the Last Year: Not on file  . Ran Out of Food in the Last Year: Not on file  Transportation Needs:   . Lack of Transportation (Medical): Not on file  . Lack of Transportation (Non-Medical): Not on file  Physical Activity:   . Days of Exercise per Week: Not on file  . Minutes of Exercise per Session: Not on file  Stress:   . Feeling of Stress : Not on file  Social Connections:   . Frequency of Communication with Friends and Family: Not on file  . Frequency of Social Gatherings with Friends and Family: Not on file  .  Attends Religious Services: Not on file  . Active Member of Clubs or Organizations: Not on file  . Attends Archivist Meetings: Not on file  . Marital Status: Not on file  Intimate Partner Violence:   . Fear of Current or Ex-Partner: Not on file  . Emotionally Abused: Not on file  . Physically Abused: Not on file  . Sexually Abused: Not on file    Past Surgical History:  Procedure Laterality Date  . ADENOIDECTOMY    . ARTERY BIOPSY Right 07/14/2019   Procedure: BIOPSY TEMPORAL ARTERY;  Surgeon: [name], MD;  Location: [facility];  Service: Vascular;  Laterality: Right;  . BREAST CYST ASPIRATION Left   . CARDIAC CATHETERIZATION  02/15/2017   [facility]  . COLONOSCOPY WITH PROPOFOL N/A 10/22/2017   Procedure: COLONOSCOPY WITH PROPOFOL;  Surgeon: Lucilla Lame, MD;  Location: [place];  Service: Endoscopy;  Laterality: N/A;  specimens not taken--pt on Plavix will be brought back in after 7 days off med  . COLONOSCOPY WITH PROPOFOL N/A 11/05/2017   Procedure: COLONOSCOPY WITH PROPOFOL;  Surgeon: Lucilla Lame, MD;  Location: [place];  Service: Endoscopy;  Laterality: N/A;  . ESOPHAGOGASTRODUODENOSCOPY N/A 12/21/2014   Procedure: ESOPHAGOGASTRODUODENOSCOPY (EGD);  Surgeon: Lucilla Lame, MD;  Location: [place];  Service: Gastroenterology;  Laterality: N/A;  . ESOPHAGOGASTRODUODENOSCOPY (EGD) WITH PROPOFOL N/A 02/08/2016   Procedure: ESOPHAGOGASTRODUODENOSCOPY (EGD) WITH PROPOFOL;  Surgeon: Lucilla Lame, MD;  Location: [facility];  Service: Endoscopy;  Laterality: N/A;  . FASCIOTOMY Right 05/30/2019   Procedure: FASCIOTOMY;  Surgeon: [name], MD;  Location: [facility];  Service: Vascular;  Laterality: Right;  . FASCIOTOMY CLOSURE Right 06/04/2019   Procedure: FASCIOTOMY CLOSURE;  Surgeon: [name], MD;  Location: [facility];  Service: Vascular;  Laterality: Right;  . HEMORROIDECTOMY  2014  . LOWER EXTREMITY ANGIOGRAPHY Right 05/30/2019    Procedure: LOWER EXTREMITY ANGIOGRAPHY;  Surgeon: [name], MD;  Location: [place] [facility];  Service: Cardiovascular;  Laterality: Right;  . PERIPHERAL VASCULAR CATHETERIZATION N/A 02/09/2016   Procedure: Abdominal Aortogram w/Lower Extremity;  Surgeon: [name], MD;  Location: [place] [facility];  Service: Cardiovascular;  Laterality: N/A;  . PERIPHERAL VASCULAR CATHETERIZATION Right 02/10/2016   Procedure: Lower Extremity Angiography;  Surgeon: [name], MD;  Location:  [place] [facility];  Service: Cardiovascular;  Laterality: Right;  . POLYPECTOMY  11/05/2017   Procedure: POLYPECTOMY;  Surgeon: Lucilla Lame, MD;  Location: [place];  Service: Endoscopy;;  . SHOULDER ARTHROSCOPY WITH ROTATOR CUFF REPAIR AND SUBACROMIAL DECOMPRESSION Right 02/27/2020   Procedure: RIGHT SHOULDER ARTHROSCOPY SUBACROMIAL DECOMPRESSION, DISTAL CLAVICLE EXCISION AND MINI-OPEN ROTATOR CUFF REPAIR;  Surgeon: Thornton Park, MD;  Location: [facility];  Service: Orthopedics;  Laterality: Right;  . TONSILLECTOMY    . VASCULAR SURGERY  [id number]   Fem-Pop Bypass    Family History  Problem Relation Age of Onset  . CVA Mother   . Heart attack Mother   . Cancer Father        colon cancer  . Colon cancer Father   . Heart disease Maternal Uncle   . Breast cancer Neg Hx     Allergies  Allergen Reactions  . Amoxicillin Other (See Comments)    Yeast infection  . Metoprolol Tartrate Rash  . Oxycodone Itching  . Vicodin [Hydrocodone-Acetaminophen] Hives and Rash    Flushing    CBC Latest Ref Rng & Units 02/18/2020 07/03/2019 06/07/2019  WBC 4.0 - 10.5 K/uL 4.3 7.3 7.6  Hemoglobin 12.0 - 15.0 g/dL 12.1 11.9(L) 8.6(L)  Hematocrit 36 - 46 % 38.9 36.7 27.6(L)  Platelets 150 - 400 K/uL 227 263 252      CMP     Component Value Date/Time   NA 139 02/18/2020 0949   NA 139 09/21/2015 0916   K 4.1 02/18/2020 0949   CL 102 02/18/2020 0949   CO2 28 02/18/2020 0949    GLUCOSE 85 02/18/2020 0949   BUN 20 02/18/2020 0949   BUN 12 09/21/2015 0916   CREATININE 0.97 02/18/2020 0949   CREATININE 0.88 05/16/2016 1034   CALCIUM 9.4 02/18/2020 0949   PROT 4.4 (L) 06/01/2019 0352   PROT 6.8 09/21/2015 0916   ALBUMIN 2.2 (L) 06/01/2019 0352   ALBUMIN 4.2 09/21/2015 0916   AST 36 06/01/2019 0352   ALT 13 06/01/2019 0352   ALKPHOS 29 (L) 06/01/2019 0352   BILITOT 0.3 06/01/2019 0352   BILITOT 0.3 09/21/2015 0916   GFRNONAA >60 02/18/2020 0949   GFRNONAA 66 05/01/2016 0846   GFRAA >60 02/18/2020 0949   GFRAA 76 05/01/2016 0846         Assessment & Plan:   1. Atherosclerosis of native artery of both lower extremities with intermittent claudication (HCC) Recommend:  The patient has experienced increased symptoms and is now describing lifestyle limiting claudication and mild rest pain.   Given the severity of the patient's lower extremity symptoms the patient should undergo angiography and intervention.  Risk and benefits were reviewed the patient.  Indications for the procedure were reviewed.  All questions were answered, the patient agrees to proceed.   The patient should continue walking and begin a more formal exercise program.  The patient should continue antiplatelet therapy and aggressive treatment of the lipid abnormalities  The patient will follow up with me after the angiogram.   2. Other hyperlipidemia Continue statin as ordered and reviewed, no changes at this time   3. Essential hypertension Continue antihypertensive medications as already ordered, these medications have been reviewed and there are no changes at this time.    Current Outpatient Medications on File Prior to Visit  Medication Sig Dispense Refill  . apixaban (ELIQUIS) 5 MG TABS tablet Take 1 tablet (5 mg total) by mouth 2 (two) times daily. 8 tablet 0  .  atorvastatin (LIPITOR) 40 MG tablet Take 40 mg by mouth at bedtime.     . cholecalciferol (VITAMIN D3) 25 MCG (1000  UNIT) tablet Take 1,000 Units by mouth daily.    . clobetasol cream (TEMOVATE) 0.05 % Apply topically.    . clopidogrel (PLAVIX) 75 MG tablet TAKE 1 TABLET DAILY (Patient taking differently: Take 75 mg by mouth daily. ) 90 tablet 3  . diltiazem (CARDIZEM CD) 240 MG 24 hr capsule Take 240 mg by mouth daily.    Marland Kitchen ibandronate (BONIVA) 150 MG tablet Take 150 mg by mouth every 30 (thirty) days.     Marland Kitchen ketorolac (ACULAR) 0.5 % ophthalmic solution 1 drop 4 (four) times daily.    Marland Kitchen losartan (COZAAR) 25 MG tablet Take 25 mg by mouth every morning.     . pantoprazole (PROTONIX) 40 MG tablet TAKE 1 TABLET DAILY 90 tablet 1  . Tocilizumab (ACTEMRA ACTPEN) 162 MG/0.9ML SOAJ Inject 162 mg into the skin once a week.     . venlafaxine XR (EFFEXOR-XR) 150 MG 24 hr capsule Take 150 mg by mouth daily with breakfast.    . zolpidem (AMBIEN) 5 MG tablet Take 5 mg by mouth at bedtime as needed for sleep.     . calcium carbonate (OSCAL) 1500 (600 Ca) MG TABS tablet Take 1,200 mg of elemental calcium by mouth 2 (two) times daily with a meal.    . Calcium-Vitamin D-Vitamin K (CALCIUM+MENAQ7) 500-200-90 MG-UNT-MCG TABS Take by mouth.    . ondansetron (ZOFRAN) 4 MG tablet Take 1 tablet (4 mg total) by mouth every 8 (eight) hours as needed for nausea or vomiting. 30 tablet 0  . oxyCODONE (OXY IR/ROXICODONE) 5 MG immediate release tablet Take 1 tablet (5 mg total) by mouth every 4 (four) hours as needed. 40 tablet 0   No current facility-administered medications on file prior to visit.    There are no Patient Instructions on file for this visit. No follow-ups on file.   Kris Hartmann, NP

## 2020-06-24 ENCOUNTER — Other Ambulatory Visit: Payer: Self-pay | Admitting: Nurse Practitioner

## 2020-06-24 DIAGNOSIS — Z1231 Encounter for screening mammogram for malignant neoplasm of breast: Secondary | ICD-10-CM

## 2020-06-28 ENCOUNTER — Other Ambulatory Visit
Admission: RE | Admit: 2020-06-28 | Discharge: 2020-06-28 | Disposition: A | Discharge status: Home / Self Care | Payer: Medicare Other | Source: Ambulatory Visit | Attending: Vascular Surgery | Admitting: Vascular Surgery

## 2020-06-28 ENCOUNTER — Other Ambulatory Visit: Payer: Self-pay

## 2020-06-28 ENCOUNTER — Telehealth (INDEPENDENT_AMBULATORY_CARE_PROVIDER_SITE_OTHER): Payer: Self-pay

## 2020-06-28 ENCOUNTER — Other Ambulatory Visit (INDEPENDENT_AMBULATORY_CARE_PROVIDER_SITE_OTHER): Payer: Self-pay | Admitting: Nurse Practitioner

## 2020-06-28 DIAGNOSIS — Z20822 Contact with and (suspected) exposure to covid-19: Secondary | ICD-10-CM | POA: Insufficient documentation

## 2020-06-28 DIAGNOSIS — Z01812 Encounter for preprocedural laboratory examination: Secondary | ICD-10-CM | POA: Diagnosis present

## 2020-06-28 NOTE — Telephone Encounter (Signed)
She doesn't necessarily need to be seen.  She is scheduled to have an angiogram, I will talk to Mickel Baas about moving her up to this week.  I will have Mickel Baas call her with the information

## 2020-06-29 LAB — SARS CORONAVIRUS 2 (TAT 6-24 HRS): SARS Coronavirus 2: NEGATIVE

## 2020-06-29 NOTE — Telephone Encounter (Signed)
Spoke with the patient and she has been moved from her original angio date of 07/06/20 to 07/02/20 with a 11:00 pm arrival time to the [facility]. Patient did her covid test on 06/28/20 at the [place]. Pre-procedure instructions were discussed.

## 2020-07-01 ENCOUNTER — Other Ambulatory Visit (INDEPENDENT_AMBULATORY_CARE_PROVIDER_SITE_OTHER): Payer: Self-pay | Admitting: Nurse Practitioner

## 2020-07-02 ENCOUNTER — Encounter: Payer: Self-pay | Admitting: Vascular Surgery

## 2020-07-02 ENCOUNTER — Other Ambulatory Visit: Payer: Medicare Other

## 2020-07-02 ENCOUNTER — Inpatient Hospital Stay
Admission: RE | Admit: 2020-07-02 | Discharge: 2020-07-04 | DRG: 272 | Disposition: A | Discharge status: Home Health | Payer: Medicare Other | Attending: Vascular Surgery | Admitting: Vascular Surgery

## 2020-07-02 ENCOUNTER — Encounter
Admission: RE | Disposition: A | Discharge status: Home Health | Payer: Self-pay | Source: Home / Self Care | Attending: Vascular Surgery

## 2020-07-02 ENCOUNTER — Other Ambulatory Visit: Payer: Self-pay

## 2020-07-02 DIAGNOSIS — I70223 Atherosclerosis of native arteries of extremities with rest pain, bilateral legs: Secondary | ICD-10-CM | POA: Diagnosis not present

## 2020-07-02 DIAGNOSIS — M810 Age-related osteoporosis without current pathological fracture: Secondary | ICD-10-CM | POA: Diagnosis present

## 2020-07-02 DIAGNOSIS — Z7901 Long term (current) use of anticoagulants: Secondary | ICD-10-CM

## 2020-07-02 DIAGNOSIS — Z86718 Personal history of other venous thrombosis and embolism: Secondary | ICD-10-CM

## 2020-07-02 DIAGNOSIS — Z881 Allergy status to other antibiotic agents status: Secondary | ICD-10-CM | POA: Diagnosis not present

## 2020-07-02 DIAGNOSIS — M1990 Unspecified osteoarthritis, unspecified site: Secondary | ICD-10-CM | POA: Diagnosis present

## 2020-07-02 DIAGNOSIS — I70219 Atherosclerosis of native arteries of extremities with intermittent claudication, unspecified extremity: Secondary | ICD-10-CM

## 2020-07-02 DIAGNOSIS — Z7902 Long term (current) use of antithrombotics/antiplatelets: Secondary | ICD-10-CM

## 2020-07-02 DIAGNOSIS — K219 Gastro-esophageal reflux disease without esophagitis: Secondary | ICD-10-CM | POA: Diagnosis present

## 2020-07-02 DIAGNOSIS — Z79899 Other long term (current) drug therapy: Secondary | ICD-10-CM | POA: Diagnosis not present

## 2020-07-02 DIAGNOSIS — F32A Depression, unspecified: Secondary | ICD-10-CM | POA: Diagnosis present

## 2020-07-02 DIAGNOSIS — I70213 Atherosclerosis of native arteries of extremities with intermittent claudication, bilateral legs: Principal | ICD-10-CM | POA: Diagnosis present

## 2020-07-02 DIAGNOSIS — Z87891 Personal history of nicotine dependence: Secondary | ICD-10-CM

## 2020-07-02 DIAGNOSIS — I10 Essential (primary) hypertension: Secondary | ICD-10-CM | POA: Diagnosis present

## 2020-07-02 DIAGNOSIS — Z885 Allergy status to narcotic agent status: Secondary | ICD-10-CM

## 2020-07-02 DIAGNOSIS — I998 Other disorder of circulatory system: Secondary | ICD-10-CM | POA: Diagnosis present

## 2020-07-02 DIAGNOSIS — E7849 Other hyperlipidemia: Secondary | ICD-10-CM | POA: Diagnosis present

## 2020-07-02 DIAGNOSIS — Z8249 Family history of ischemic heart disease and other diseases of the circulatory system: Secondary | ICD-10-CM

## 2020-07-02 HISTORY — PX: LOWER EXTREMITY ANGIOGRAPHY: CATH118251

## 2020-07-02 LAB — CREATININE, SERUM
Creatinine, Ser: 0.91 mg/dL (ref 0.44–1.00)
GFR, Estimated: >60 mL/min (ref >60)

## 2020-07-02 LAB — BUN: BUN: 15 mg/dL (ref 8–23)

## 2020-07-02 SURGERY — LOWER EXTREMITY ANGIOGRAPHY
Anesthesia: Moderate Sedation | Site: Leg Lower | Laterality: Right

## 2020-07-02 MED ORDER — FENTANYL CITRATE (PF) 100 MCG/2ML IJ SOLN
INTRAMUSCULAR | Status: AC
  Filled 2020-07-02: qty 2

## 2020-07-02 MED ORDER — DIPHENHYDRAMINE HCL 50 MG/ML IJ SOLN: 50.0000 mg | Freq: Once | INTRAMUSCULAR | Status: DC | PRN

## 2020-07-02 MED ORDER — MIDAZOLAM HCL 5 MG/5ML IJ SOLN
INTRAMUSCULAR | Status: AC
  Filled 2020-07-02: qty 5

## 2020-07-02 MED ORDER — DIPHENHYDRAMINE HCL 50 MG/ML IJ SOLN
INTRAMUSCULAR | Status: AC
  Filled 2020-07-02: qty 1

## 2020-07-02 MED ORDER — FENTANYL CITRATE (PF) 100 MCG/2ML IJ SOLN: 12.5000 ug | Freq: Once | INTRAMUSCULAR | Status: DC | PRN

## 2020-07-02 MED ORDER — CLINDAMYCIN PHOSPHATE 300 MG/50ML IV SOLN
INTRAVENOUS | Status: AC
  Filled 2020-07-02: qty 50

## 2020-07-02 MED ORDER — HEPARIN SODIUM (PORCINE) 1000 UNIT/ML IJ SOLN
INTRAMUSCULAR | Status: AC
  Filled 2020-07-02: qty 1

## 2020-07-02 MED ORDER — OXYCODONE HCL 5 MG PO TABS
5.0000 mg | ORAL_TABLET | ORAL | Status: DC | PRN
  Administered 2020-07-02 – 2020-07-03 (×3): 10 mg via ORAL
  Filled 2020-07-02 (×4): qty 2

## 2020-07-02 MED ORDER — DILTIAZEM HCL ER COATED BEADS 240 MG PO CP24
240.0000 mg | ORAL_CAPSULE | Freq: Every day | ORAL | Status: DC
  Administered 2020-07-03 – 2020-07-04 (×2): 240 mg via ORAL
  Filled 2020-07-02: qty 1
  Filled 2020-07-02: qty 2
  Filled 2020-07-02: qty 1
  Filled 2020-07-02: qty 2

## 2020-07-02 MED ORDER — VENLAFAXINE HCL ER 150 MG PO CP24
150.0000 mg | ORAL_CAPSULE | Freq: Every day | ORAL | Status: DC
  Administered 2020-07-03 – 2020-07-04 (×2): 150 mg via ORAL
  Filled 2020-07-02: qty 2
  Filled 2020-07-02: qty 1
  Filled 2020-07-02: qty 2
  Filled 2020-07-02: qty 1

## 2020-07-02 MED ORDER — CLINDAMYCIN PHOSPHATE 300 MG/50ML IV SOLN
300.0000 mg | Freq: Once | INTRAVENOUS | Status: AC
  Administered 2020-07-02: 300 mg via INTRAVENOUS

## 2020-07-02 MED ORDER — TIROFIBAN (AGGRASTAT) BOLUS VIA INFUSION
25.0000 ug/kg | Freq: Once | INTRAVENOUS | Status: DC
  Filled 2020-07-02: qty 28

## 2020-07-02 MED ORDER — ONDANSETRON HCL 4 MG PO TABS: 4.0000 mg | ORAL_TABLET | Freq: Four times a day (QID) | ORAL | Status: DC | PRN

## 2020-07-02 MED ORDER — TIROFIBAN (AGGRASTAT) BOLUS VIA INFUSION
25.0000 ug/kg | Freq: Once | INTRAVENOUS | Status: AC
  Administered 2020-07-02: 1382.5 ug via INTRAVENOUS
  Filled 2020-07-02: qty 28

## 2020-07-02 MED ORDER — ATORVASTATIN CALCIUM 20 MG PO TABS
40.0000 mg | ORAL_TABLET | Freq: Every day | ORAL | Status: DC
  Administered 2020-07-02 – 2020-07-03 (×2): 40 mg via ORAL
  Filled 2020-07-02 (×2): qty 2

## 2020-07-02 MED ORDER — TIROFIBAN HCL IV 12.5 MG/250 ML
INTRAVENOUS | Status: AC
  Filled 2020-07-02: qty 250

## 2020-07-02 MED ORDER — FAMOTIDINE 20 MG PO TABS: 40.0000 mg | ORAL_TABLET | Freq: Once | ORAL | Status: DC | PRN

## 2020-07-02 MED ORDER — ONDANSETRON HCL 4 MG/2ML IJ SOLN: 4.0000 mg | Freq: Four times a day (QID) | INTRAMUSCULAR | Status: DC | PRN

## 2020-07-02 MED ORDER — DIPHENHYDRAMINE HCL 50 MG/ML IJ SOLN
INTRAMUSCULAR | Status: DC | PRN
  Administered 2020-07-02: 25 mg via INTRAVENOUS

## 2020-07-02 MED ORDER — SODIUM CHLORIDE 0.9 % IV SOLN: INTRAVENOUS | Status: DC

## 2020-07-02 MED ORDER — ALTEPLASE 1 MG/ML SYRINGE FOR VASCULAR PROCEDURE
INTRAMUSCULAR | Status: DC | PRN
  Administered 2020-07-02: 12 mg via INTRA_ARTERIAL

## 2020-07-02 MED ORDER — PANTOPRAZOLE SODIUM 40 MG PO TBEC
40.0000 mg | DELAYED_RELEASE_TABLET | Freq: Every day | ORAL | Status: DC
  Administered 2020-07-03 – 2020-07-04 (×2): 40 mg via ORAL
  Filled 2020-07-02 (×2): qty 1

## 2020-07-02 MED ORDER — TIROFIBAN HCL IN NACL 5-0.9 MG/100ML-% IV SOLN
0.1500 ug/kg/min | INTRAVENOUS | Status: DC
  Administered 2020-07-02: 0.15 ug/kg/min via INTRAVENOUS
  Filled 2020-07-02 (×3): qty 100

## 2020-07-02 MED ORDER — LOSARTAN POTASSIUM 25 MG PO TABS
25.0000 mg | ORAL_TABLET | ORAL | Status: DC
  Administered 2020-07-03 – 2020-07-04 (×2): 25 mg via ORAL
  Filled 2020-07-02 (×2): qty 1

## 2020-07-02 MED ORDER — MIDAZOLAM HCL 2 MG/ML PO SYRP: 8.0000 mg | ORAL_SOLUTION | Freq: Once | ORAL | Status: DC | PRN

## 2020-07-02 MED ORDER — METHYLPREDNISOLONE SODIUM SUCC 125 MG IJ SOLR: 125.0000 mg | Freq: Once | INTRAMUSCULAR | Status: DC | PRN

## 2020-07-02 MED ORDER — HEPARIN SODIUM (PORCINE) 1000 UNIT/ML IJ SOLN
INTRAMUSCULAR | Status: DC | PRN
  Administered 2020-07-02: 5000 [IU] via INTRAVENOUS

## 2020-07-02 MED ORDER — MIDAZOLAM HCL 2 MG/2ML IJ SOLN
INTRAMUSCULAR | Status: DC | PRN
  Administered 2020-07-02: 2 mg via INTRAVENOUS
  Administered 2020-07-02 (×2): 1 mg via INTRAVENOUS
  Administered 2020-07-02: 2 mg via INTRAVENOUS
  Administered 2020-07-02: 1 mg via INTRAVENOUS

## 2020-07-02 MED ORDER — FENTANYL CITRATE (PF) 100 MCG/2ML IJ SOLN
INTRAMUSCULAR | Status: DC | PRN
  Administered 2020-07-02 (×4): 50 ug via INTRAVENOUS

## 2020-07-02 MED ORDER — MIDAZOLAM HCL 2 MG/2ML IJ SOLN
INTRAMUSCULAR | Status: AC
  Filled 2020-07-02: qty 2

## 2020-07-02 SURGICAL SUPPLY — 32 items
BALLN LUTONIX  018 4X60X130 (BALLOONS) ×1
BALLN LUTONIX 018 4X60X130 (BALLOONS) ×1
BALLN LUTONIX DCB 4X60X130 (BALLOONS) ×2
BALLN ULTRVRSE 6X300X130 (BALLOONS) ×2
BALLN ULTRVRSE 6X60X130C (BALLOONS) ×2
BALLOON LUTONIX 018 4X60X130 (BALLOONS) ×1 IMPLANT
BALLOON LUTONIX DCB 4X60X130 (BALLOONS) ×1 IMPLANT
BALLOON ULTRVRSE 6X300X130 (BALLOONS) ×1 IMPLANT
BALLOON ULTRVRSE 6X60X130C (BALLOONS) ×1 IMPLANT
CANISTER PENUMBRA ENGINE (MISCELLANEOUS) ×2 IMPLANT
CATH ANGIO 5F PIGTAIL 65CM (CATHETERS) ×2 IMPLANT
CATH BERNSTEIN 5FR 130CM (CATHETERS) ×2 IMPLANT
CATH INDIGO CAT6 KIT (CATHETERS) ×2 IMPLANT
CATH INDIGO SEP 6 (CATHETERS) ×2 IMPLANT
CATH INFUS 135CMX50CM (CATHETERS) ×2 IMPLANT
DEVICE STARCLOSE SE CLOSURE (Vascular Products) ×2 IMPLANT
GLIDEWIRE ADV .035X180CM (WIRE) ×2 IMPLANT
GLIDEWIRE ADV .035X260CM (WIRE) ×2 IMPLANT
KIT ENCORE 26 ADVANTAGE (KITS) ×2 IMPLANT
NEEDLE ENTRY 21GA 7CM ECHOTIP (NEEDLE) ×2 IMPLANT
PACK ANGIOGRAPHY (CUSTOM PROCEDURE TRAY) ×2 IMPLANT
SET INTRO CAPELLA COAXIAL (SET/KITS/TRAYS/PACK) ×2 IMPLANT
SHEATH ANL2 6FRX45 HC (SHEATH) ×2 IMPLANT
SHEATH BRITE TIP 5FRX11 (SHEATH) ×2 IMPLANT
SHEATH DESTIN RDC 6FR 45 (SHEATH) ×2 IMPLANT
STENT VIABAHN 5X50X120 (Permanent Stent) ×2 IMPLANT
STENT VIABAHN 6X250X120 (Permanent Stent) ×2 IMPLANT
STENT VIABAHN 6X50X120 (Permanent Stent) ×2 IMPLANT
SYR MEDRAD MARK V 150ML (SYRINGE) ×2 IMPLANT
TUBING CONTRAST HIGH PRESS 72 (TUBING) ×2 IMPLANT
WIRE J 3MM .035X145CM (WIRE) ×2 IMPLANT
WIRE RUNTHROUGH .014X300CM (WIRE) ×2 IMPLANT

## 2020-07-02 NOTE — Op Note (Signed)
[facility] Percutaneous Study/Intervention Procedural Note   Date of Surgery: 07/02/2020  Surgeon:  [name], MD.  Pre-operative Diagnosis: Atherosclerotic occlusive disease bilateral lower extremities with rest pain of the right lower extremity; complication of vascular device with thrombosis of previously placed vascular stents  Post-operative diagnosis: Same  Procedure(s) Performed: 1. Introduction catheter into right lower extremity 3rd order catheter placement  2. Contrast injection right lower extremity for distal runoff   3. Percutaneous transluminal angioplasty and stent placement right femoral to tibial bypass graft   4.  Percutaneous transluminal angioplasty and stent placement right peroneal artery.              5.  Mechanical thrombectomy of the femoral to tibial bypass graft using the penumbra CAT 6 device              6.  Star close closure left common femoral arteriotomy  Anesthesia: Conscious sedation was administered under my direct supervision by the interventional radiology RN. IV Versed plus fentanyl were utilized. Continuous ECG, pulse oximetry and blood pressure was monitored throughout the entire procedure.  Conscious sedation was for a total of 158 minutes.  Sheath: 6 French destination left common femoral retrograde  Contrast: 95 cc  Fluoroscopy Time: 18.8 minutes  Indications: TWISHA VANPELT presents with occlusion of her femoral to peroneal bypass graft.  With this occurrence she is developed rest pain.  Physical examination as well as noninvasive studies are consistent with limb threatening ischemia and angiography with intervention with the hope for limb salvage has been recommended.  The risks and benefits are reviewed all questions answered patient agrees to proceed.  Procedure: SAMANVI CUCCIA is a 69 y.o. y.o. female who was identified and appropriate procedural  time out was performed. The patient was then placed supine on the table and prepped and draped in the usual sterile fashion.   Ultrasound was placed in the sterile sleeve and the left groin was evaluated the left common femoral artery was echolucent and pulsatile indicating patency.  Image was recorded for the permanent record and under real-time visualization a microneedle was inserted into the common femoral artery microwire followed by a micro-sheath.  A J-wire was then advanced through the micro-sheath and a  5 [place] sheath was then inserted over a J-wire. J-wire was then advanced and a 5 French pigtail catheter was positioned at the level of T12. AP projection of the aorta was then obtained. Pigtail catheter was repositioned to above the bifurcation and a LAO view of the pelvis was obtained.  Subsequently a pigtail catheter with the stiff angle Glidewire was used to cross the aortic bifurcation the catheter wire were advanced down into the right distal external iliac artery. Oblique view of the femoral bifurcation was then obtained and subsequently the wire was reintroduced and the pigtail catheter negotiated into the profunda representing third order catheter placement. Distal runoff was then performed.  Diagnostic interpretation: The abdominal aorta is opacified with a bolus injection contrast.  There are no hemodynamically significant stenoses noted.  At the aortic bifurcation the left common iliac demonstrates a 40 to 50% stenosis at its origin.  The right common iliac is widely patent.  There is mild atherosclerotic changes through the common and external iliac arteries bilaterally.  Bilateral internal iliac arteries are patent.  The right common femoral profunda femoris are widely patent.  The SFA remains patent at its origin but it is extremely small and then occludes in the mid  thigh.  The previously placed bypass graft is occluded from its origin down to the below-knee area where profunda  collaterals reconstitute the peroneal which is the only tibial vessel to the foot.  5000 units of heparin was then given and allowed to circulate and a 6 [place] Raby sheath was advanced up and over the bifurcation and positioned in the right common femoral artery subsequently this is exchanged for a 6 [place] destination again positioned with its tip in the right common femoral artery.  Advantage wire and a 100 cm catheter is advanced into the occluded bypass and then negotiated down to the distal anastomosis.  Hand-injection contrast demonstrates patency and intraluminal positioning within the peroneal.  The advantage wire is reintroduced and an infusion catheter is placed.  12 mg of TPA was reconstituted a infusion catheter with a 50 cm infusion length is then used to deliver the TPA.  TPA is allowed to dwell for 30 minutes.  The CAT 6 thrombectomy catheter is then prepped on the field advanced over the 0.35 wire and exchanged for the infusion catheter.  3 complete passes were made with the CAT 6.  Follow-up imaging now demonstrates there is patency of the bypass graft.  There is greater than 70% stenosis in the proximal portion within the previously placed Viabahn stent.  There is greater than 70% stenosis within a stent in the midportion.  There is also subtotal occlusion of the distal stent extending into the peroneal artery.  Angioplasty was then performed to 4 mm distally and 6 mm throughout the entire length of the graft.  All 3 of the above-noted lesions remained greater than 50% and I therefore deployed a 5 mm x 50 mm Viabahn extending into the peroneal by approximately 10 to 15 mm.  This was postdilated with a 4 mm Lutonix balloon inflated to 6 atm for 1 minute.  Next a 6 mm x 25 cm Viabahn was deployed across the midportion of the bypass graft and postdilated with a 6 mm x 30 cm Ultraverse balloon.  Lastly a 6 mm x 50 mm Viabahn stent was deployed across the proximal bypass at the level of the  origin and this was postdilated with a 6 mm balloon.  Small amount of residual thrombus was noted at the leading edge of the stent and the CAT 6 was reintroduced and used to extract this.  After review of these images the sheath is pulled into the left external iliac oblique of the common femoral is obtained and a Star close device deployed. There no immediate complications.   Findings:  The abdominal aorta is opacified with a bolus injection contrast.  There are no hemodynamically significant stenoses noted.  At the aortic bifurcation the left common iliac demonstrates a 40 to 50% stenosis at its origin.  The right common iliac is widely patent.  There is mild atherosclerotic changes through the common and external iliac arteries bilaterally.  Bilateral internal iliac arteries are patent.  The right common femoral profunda femoris are widely patent.  The SFA remains patent at its origin but it is extremely small and then occludes in the mid thigh.  The previously placed bypass graft is occluded from its origin down to the below-knee area where profunda collaterals reconstitute the peroneal which is the only tibial vessel to the foot  Following mechanical thrombectomy there is now patency reestablished to the graft and there are now 3 hemodynamically significant lesions identified.  After angioplasty these lesions remained greater than 50%  and therefore 3 stents are placed one at the origin of the graft.  One stent is placed in the midportion.  And one stent is placed distally extending into the peroneal.  Following angioplasty and stent placement with postdilatation as noted above there is less than 10% residual stenosis at all 3 of these sites with successful recanalization of the right lower extremity   Summary: Successful recanalization right lower extremity for limb salvage    Disposition: Patient was taken to the recovery room in stable condition having tolerated the  procedure well.  Criag Wicklund, Dolores Lory 07/02/2020,3:47 PM

## 2020-07-02 NOTE — Interval H&P Note (Signed)
History and Physical Interval Note:  07/02/2020 12:56 PM  Sara [name]  has presented today for surgery, with the diagnosis of RT lower extremity angio    BARD    ASO w claudication.  The various methods of treatment have been discussed with the patient and family. After consideration of risks, benefits and other options for treatment, the patient has consented to  Procedure(s): LOWER EXTREMITY ANGIOGRAPHY (Right) as a surgical intervention.  The patient's history has been reviewed, patient examined, no change in status, stable for surgery.  I have reviewed the patient's chart and labs.  Questions were answered to the patient's satisfaction.     [name]

## 2020-07-02 NOTE — Interval H&P Note (Signed)
History and Physical Interval Note:  07/02/2020 12:55 PM  Sara [name]  has presented today for surgery, with the diagnosis of RT lower extremity angio    BARD    ASO w claudication.  The various methods of treatment have been discussed with the patient and family. After consideration of risks, benefits and other options for treatment, the patient has consented to  Procedure(s): LOWER EXTREMITY ANGIOGRAPHY (Right) as a surgical intervention.  The patient's history has been reviewed, patient examined, no change in status, stable for surgery.  I have reviewed the patient's chart and labs.  Questions were answered to the patient's satisfaction.     [name]

## 2020-07-02 NOTE — Plan of Care (Signed)

## 2020-07-03 LAB — CBC
HCT: 36.5 % (ref 36.0–46.0)
Hemoglobin: 12.1 g/dL (ref 12.0–15.0)
MCH: 31.5 pg (ref 26.0–34.0)
MCHC: 33.2 g/dL (ref 30.0–36.0)
MCV: 95.1 fL (ref 80.0–100.0)
Platelets: 132 10*3/uL — ABNORMAL LOW (ref 150–400)
RBC: 3.84 MIL/uL — ABNORMAL LOW (ref 3.87–5.11)
RDW: 15.4 % (ref 11.5–15.5)
WBC: 4.5 10*3/uL (ref 4.0–10.5)
nRBC: 0 % (ref 0.0–0.2)

## 2020-07-03 LAB — BASIC METABOLIC PANEL
Anion gap: 6 (ref 5–15)
BUN: 16 mg/dL (ref 8–23)
CO2: 25 mmol/L (ref 22–32)
Calcium: 8.3 mg/dL — ABNORMAL LOW (ref 8.9–10.3)
Chloride: 105 mmol/L (ref 98–111)
Creatinine, Ser: 0.88 mg/dL (ref 0.44–1.00)
GFR, Estimated: >60 mL/min (ref >60)
Glucose, Bld: 113 mg/dL — ABNORMAL HIGH (ref 70–99)
Potassium: 4.1 mmol/L (ref 3.5–5.1)
Sodium: 136 mmol/L (ref 135–145)

## 2020-07-03 LAB — HIV ANTIBODY (ROUTINE TESTING W REFLEX): HIV Screen 4th Generation wRfx: NONREACTIVE

## 2020-07-03 LAB — MAGNESIUM: Magnesium: 1.9 mg/dL (ref 1.7–2.4)

## 2020-07-03 MED ORDER — ZOLPIDEM TARTRATE 5 MG PO TABS
5.0000 mg | ORAL_TABLET | Freq: Every evening | ORAL | Status: DC | PRN
  Filled 2020-07-03: qty 1

## 2020-07-03 MED ORDER — ASPIRIN EC 81 MG PO TBEC
81.0000 mg | DELAYED_RELEASE_TABLET | Freq: Every day | ORAL | Status: DC
  Administered 2020-07-03 – 2020-07-04 (×2): 81 mg via ORAL
  Filled 2020-07-03 (×2): qty 1

## 2020-07-03 MED ORDER — APIXABAN 5 MG PO TABS
5.0000 mg | ORAL_TABLET | Freq: Two times a day (BID) | ORAL | Status: DC
  Administered 2020-07-03 – 2020-07-04 (×3): 5 mg via ORAL
  Filled 2020-07-03 (×3): qty 1

## 2020-07-03 NOTE — Progress Notes (Signed)
Physical Therapy Evaluation Patient Details Name: Sara [name] MRN: [id number] DOB: 1950/12/28 Today's Date: 07/03/2020   History of Present Illness  Pt is s/p LOWER EXTREMITY ANGIOGRAPHY (Right) as a surgical intervention.   Clinical Impression  Patient agrees to PT eval. She has 3/5 BLE hip flex and knee extension strength. She has good sitting balance and good standing balance with UE support with RW.  She is MI with bed mobility, MI assist for transfers sit to stand with RW. She ambulates with RW 100 feet with MI assist. She has 5/10 RLE pain and  fatigue following gait. Patient will continue to benefit from skilled PT to improve mobility and strength.    Follow Up Recommendations Home health PT    Equipment Recommendations  Rolling walker with 5" wheels    Recommendations for Other Services       Precautions / Restrictions Precautions Precautions: None Restrictions Weight Bearing Restrictions: No      Mobility  Bed Mobility Overal bed mobility: Modified Independent                  Transfers Overall transfer level: Modified independent Equipment used: Rolling walker (2 wheeled)             General transfer comment: cues for safety and hand placement  Ambulation/Gait Ambulation/Gait assistance: Modified independent (Device/Increase time) Gait Distance (Feet): 100 Feet Assistive device: Rolling walker (2 wheeled) Gait Pattern/deviations: Step-to pattern Gait velocity:  (decreased gait speed)      Stairs            Wheelchair Mobility    Modified Rankin (Stroke Patients Only)       Balance Overall balance assessment: Independent                                           Pertinent Vitals/Pain Pain Assessment: 0-10 Pain Score: 5  Pain Location: RLE Pain Descriptors / Indicators: Aching Pain Intervention(s): Limited activity within patient's tolerance;Monitored during session    Home Living Family/patient expects  to be discharged to:: Private residence Living Arrangements: Alone Available Help at Discharge: Friend(s)   Home Access: Level entry     Home Layout: One level Home Equipment: None      Prior Function Level of Independence: Independent               Hand Dominance        Extremity/Trunk Assessment   Upper Extremity Assessment Upper Extremity Assessment: Overall WFL for tasks assessed    Lower Extremity Assessment Lower Extremity Assessment: Generalized weakness       Communication   Communication: No difficulties  Cognition Arousal/Alertness: Awake/alert Behavior During Therapy: WFL for tasks assessed/performed Overall Cognitive Status: Within Functional Limits for tasks assessed                                        General Comments      Exercises     Assessment/Plan    PT Assessment Patient needs continued PT services  PT Problem List Decreased strength;Decreased activity tolerance;Pain       PT Treatment Interventions Gait training;Therapeutic activities;Therapeutic exercise;Balance training    PT Goals (Current goals can be found in the Care Plan section)  Acute Rehab PT Goals Patient Stated Goal: to walk better  PT Goal Formulation: With patient Time For Goal Achievement: 07/17/20 Potential to Achieve Goals: Good    Frequency Min 2X/week   Barriers to discharge Decreased caregiver support      Co-evaluation               AM-PAC PT "6 Clicks" Mobility  Outcome Measure Help needed turning from your back to your side while in a flat bed without using bedrails?: None Help needed moving from lying on your back to sitting on the side of a flat bed without using bedrails?: None Help needed moving to and from a bed to a chair (including a wheelchair)?: A Little Help needed standing up from a chair using your arms (e.g., wheelchair or bedside chair)?: A Little Help needed to walk in hospital room?: A Little Help needed  climbing 3-5 steps with a railing? : A Little 6 Click Score: 20    End of Session Equipment Utilized During Treatment: Gait belt Activity Tolerance: Patient tolerated treatment well Patient left: in bed;with bed alarm set Nurse Communication: Mobility status PT Visit Diagnosis: Muscle weakness (generalized) (M62.81);Difficulty in walking, not elsewhere classified (R26.2);Pain Pain - Right/Left: Right Pain - part of body: Leg    Time: 1355-1415 PT Time Calculation (min) (ACUTE ONLY): 20 min   Charges:   PT Evaluation $PT Eval Low Complexity: 1 Low PT Treatments $Gait Training: 8-22 mins          Alanson Puls, PT DPT 07/03/2020, 3:16 PM

## 2020-07-03 NOTE — Progress Notes (Signed)
1 Day Post-Op   Subjective/Chief Complaint: Doing OK. Pain in RIGHT leg- well controlled. Notes she has had difficulty ambulating. Per nursing the patient has not been able to ambulate without significant assist   Objective: Vital signs in last 24 hours: Temp:  [97.6 F (36.4 C)-99.4 F (37.4 C)] 98.2 F (36.8 C) (11/13 0741) Pulse Rate:  [72-110] 72 (11/13 0741) Resp:  [12-28] 18 (11/13 0741) BP: (85-147)/(47-73) 90/56 (11/13 0741) SpO2:  [93 %-100 %] 95 % (11/13 0741) Weight:  [55.3 kg] 55.3 kg (11/12 1730) Last BM Date: 07/02/20  Intake/Output from previous day: 11/12 0701 - 11/13 0700 In: 1687.9 [P.O.:717; I.V.:920.9; IV Piggyback:50] Out: 210 [Urine:210] Intake/Output this shift: No intake/output data recorded.  General appearance: alert and no distress Cardio: regular rate and rhythm Extremities: LEFT groin- soft, no hematoma. RIGHT: warm,calf soft, foot warm to toes   Lab Results:  Recent Labs    07/03/20 0553  WBC 4.5  HGB 12.1  HCT 36.5  PLT 132*   BMET Recent Labs    07/02/20 1025 07/03/20 0553  NA  --  136  K  --  4.1  CL  --  105  CO2  --  25  GLUCOSE  --  113*  BUN 15 16  CREATININE 0.91 0.88  CALCIUM  --  8.3*   PT/INR No results for input(s): LABPROT, INR in the last 72 hours. ABG No results for input(s): PHART, HCO3 in the last 72 hours.  Invalid input(s): PCO2, PO2  Studies/Results: PERIPHERAL VASCULAR CATHETERIZATION  Result Date: 07/02/2020 See op note   Anti-infectives: Anti-infectives (From admission, onward)   Start     Dose/Rate Route Frequency Ordered Stop   07/02/20 1000  clindamycin (CLEOCIN) IVPB 300 mg        300 mg 100 mL/hr over 30 Minutes Intravenous  Once 07/02/20 0955 07/02/20 1337   07/02/20 0955  clindamycin (CLEOCIN) 300 MG/50ML IVPB       Note to Pharmacy: Genelle Bal   : cabinet override      07/02/20 0955 07/02/20 2159      Assessment/Plan: s/p Procedure(s): LOWER EXTREMITY ANGIOGRAPHY  (Right) Will have PT evaluate patient today  Possible discharge tomorrow The patient live alone, has a friend that will be with her- ensure safety with ambulation. Eliquis/ASA   LOS: 1 day    Sara [name] A 07/03/2020

## 2020-07-03 NOTE — Plan of Care (Signed)
  Problem: Health Behavior/Discharge Planning: Goal: Ability to manage health-related needs will improve Outcome: Progressing   Problem: Clinical Measurements: Goal: Cardiovascular complication will be avoided Outcome: Progressing   Problem: Pain Managment: Goal: General experience of comfort will improve Outcome: Progressing   Problem: Safety: Goal: Ability to remain free from injury will improve Outcome: Progressing

## 2020-07-03 NOTE — Plan of Care (Signed)
  Problem: Education: Goal: Knowledge of General Education information will improve Description: Including pain rating scale, medication(s)/side effects and non-pharmacologic comfort measures Outcome: Progressing   Problem: Health Behavior/Discharge Planning: Goal: Ability to manage health-related needs will improve Outcome: Progressing   Problem: Clinical Measurements: Goal: Cardiovascular complication will be avoided Outcome: Progressing   Problem: Activity: Goal: Risk for activity intolerance will decrease Outcome: Progressing   Problem: Pain Managment: Goal: General experience of comfort will improve Outcome: Progressing   Problem: Safety: Goal: Ability to remain free from injury will improve Outcome: Progressing   

## 2020-07-04 MED ORDER — OXYCODONE HCL 5 MG PO TABS: 5.0000 mg | ORAL_TABLET | Freq: Four times a day (QID) | ORAL | 0 refills | Status: AC | PRN

## 2020-07-04 MED ORDER — ASPIRIN 81 MG PO TBEC
81.0000 mg | DELAYED_RELEASE_TABLET | Freq: Every day | ORAL | 11 refills | Status: DC
Start: 2020-07-04 — End: 2020-08-16

## 2020-07-04 NOTE — Discharge Instructions (Signed)
Atherosclerosis  Atherosclerosis is narrowing and hardening of the arteries. Arteries are blood vessels that carry blood from the heart to all parts of the body. This blood contains oxygen. Arteries can become narrow or clogged with a buildup of fat, cholesterol, calcium, and other substances (plaque). Plaque decreases the amount of blood that can flow through the artery. Atherosclerosis can affect any artery in the body, including:  Heart arteries (coronary artery disease). This may cause a heart attack.  Brain arteries. This may cause a stroke (cerebrovascular accident).  Leg, arm, and pelvis arteries (peripheral artery disease). This may cause pain and numbness.  Kidney arteries. This may cause kidney (renal) failure. Treatment may slow the disease and prevent further damage to the heart, brain, peripheral arteries, and kidneys. What are the causes? Atherosclerosis develops slowly over many years. The inner layers of your arteries become damaged and allow the gradual buildup of plaque. The exact cause of atherosclerosis is not fully understood. Symptoms of atherosclerosis do not occur until the artery becomes narrow or blocked. What increases the risk? The following factors may make you more likely to develop this condition:  High blood pressure.  High cholesterol.  Being middle-aged or older.  Having a family history of atherosclerosis.  Having high blood fats (triglycerides).  Diabetes.  Being overweight.  Smoking tobacco.  Not exercising enough (sedentary lifestyle).  Having a substance in the blood called C-reactive protein (CRP). This is a sign of increased levels of inflammation in the body.  Sleep apnea.  Being stressed.  Drinking too much alcohol. What are the signs or symptoms? This condition may not cause any symptoms. If you have symptoms, they are caused by damage to an area of your body that is not getting enough blood.  Coronary artery disease may cause  chest pain and shortness of breath.  Decreased blood supply to your brain may cause a stroke. Signs of a stroke may include sudden: ? Weakness on one side of the body. ? Confusion. ? Changes in vision. ? Inability to speak or understand speech. ? Loss of balance, coordination, or the ability to walk. ? Severe headache. ? Loss of consciousness.  Peripheral arterial disease may cause pain and numbness, often in the legs and hips.  Renal failure may cause fatigue, nausea, swelling, and itchy skin. How is this diagnosed? This condition is diagnosed based on your medical history and a physical exam. During the exam:  Your health care provider will: ? Check your pulse in different places. ? Listen for a "whooshing" sound over your arteries (bruit).  You may have tests, such as: ? Blood tests to check your levels of cholesterol, triglycerides, and CRP. ? Electrocardiogram (ECG) to check for heart damage. ? Chest X-ray to see if you have an enlarged heart, which is a sign of heart failure. ? Stress test to see how your heart reacts to exercise. ? Echocardiogram to get images of the inside of your heart. ? Ankle-brachial index to compare blood pressure in your arms to blood pressure in your ankles. ? Ultrasound of your peripheral arteries to check blood flow. ? CT scan to check for damage to your heart or brain. ? X-rays of blood vessels after dye has been injected (angiogram) to check blood flow. How is this treated? Treatment starts with lifestyle changes, which may include:  Changing your diet.  Losing weight.  Reducing stress.  Exercising and being physically active more regularly.  Not smoking. You may also need medicine to:  Lower   triglycerides and cholesterol.  Control blood pressure.  Prevent blood clots.  Lower inflammation in your body.  Control your blood sugar. Sometimes, surgery is needed to:  Remove plaque from an artery (endarterectomy).  Open or widen  a narrowed heart artery (angioplasty).  Create a new path for your blood with one of these procedures: ? Heart (coronary) artery bypass graft surgery. ? Peripheral artery bypass graft surgery. Follow these instructions at home: Eating and drinking   Eat a heart-healthy diet. Talk with your health care provider or a diet and nutrition specialist (dietitian) if you need help. A heart-healthy diet involves: ? Limiting unhealthy fats and increasing healthy fats. Some examples of healthy fats are olive oil and canola oil. ? Eating plant-based foods, such as fruits, vegetables, nuts, whole grains, and legumes (such as peas and lentils).  Limit alcohol intake to no more than 1 drink a day for nonpregnant women and 2 drinks a day for men. One drink equals 12 oz of beer, 5 oz of wine, or 1 oz of hard liquor. Lifestyle  Follow an exercise program as told by your health care provider.  Maintain a healthy weight. Lose weight if your health care provider says that you need to do that.  Rest when you are tired.  Learn to manage your stress.  Do not use any products that contain nicotine or tobacco, such as cigarettes and e-cigarettes. If you need help quitting, ask your health care provider.  Do not abuse drugs. General instructions  Take over-the-counter and prescription medicines only as told by your health care provider.  Manage other health conditions as told by your health care provider.  Keep all follow-up visits as told by your health care provider. This is important. Contact a health care provider if:  You have chest pain or discomfort. This includes squeezing chest pain that may feel like indigestion (angina).  You have shortness of breath.  You have an irregular heartbeat.  You have unexplained fatigue.  You have unexplained pain or numbness in an arm, leg, or hip.  You have nausea, swelling of your hands or feet, and itchy skin. Get help right away if:  You have any  symptoms of a heart attack, such as: ? Chest pain. ? Shortness of breath. ? Pain in your neck, jaw, arms, back, or stomach. ? Cold sweat. ? Nausea. ? Light-headedness.  You have any symptoms of a stroke. "BE FAST" is an easy way to remember the main warning signs of a stroke: ? B - Balance. Signs are dizziness, sudden trouble walking, or loss of balance. ? E - Eyes. Signs are trouble seeing or a sudden change in vision. ? F - Face. Signs are sudden weakness or numbness of the face, or the face or eyelid drooping on one side. ? A - Arms. Signs are weakness or numbness in an arm. This happens suddenly and usually on one side of the body. ? S - Speech. Signs are sudden trouble speaking, slurred speech, or trouble understanding what people say. ? T - Time. Time to call emergency services. Write down what time symptoms started.  You have other signs of a stroke, such as: ? A sudden, severe headache with no known cause. ? Nausea or vomiting. ? Seizure. These symptoms may represent a serious problem that is an emergency. Do not wait to see if the symptoms will go away. Get medical help right away. Call your local emergency services (911 in the [place] Do not drive yourself   to the hospital. Summary  Atherosclerosis is narrowing and hardening of the arteries.  Arteries can become narrow or clogged with a buildup of fat, cholesterol, calcium, and other substances (plaque).  This condition may not cause any symptoms. If you do have symptoms, they are caused by damage to an area of your body that is not getting enough blood.  Treatment may include lifestyle changes and medicines. In some cases, surgery is needed. This information is not intended to replace advice given to you by your health care provider. Make sure you discuss any questions you have with your health care provider. Document Revised: 11/16/2017 Document Reviewed: 04/12/2017 Elsevier Patient Education  [place].

## 2020-07-04 NOTE — Discharge Summary (Signed)
[facility]    Discharge Summary    Patient ID:  Sara [name] MRN: [id number] DOB/AGE: 1950-12-02 69 y.o.  Admit date: 07/02/2020 Discharge date: 07/04/2020 Date of Surgery: 07/02/2020 Surgeon: Surgeon(s): Schnier, Dolores Lory, MD  Admission Diagnosis: Ischemia of extremity [I99.8]  Discharge Diagnoses:  Ischemia of extremity [I99.8]  Secondary Diagnoses: Past Medical History:  Diagnosis Date  . Arthritis    right shoulder  . Depression   . Dyspnea   . GERD (gastroesophageal reflux disease)   . History of blood clots   . Hyperlipidemia   . Hypertension   . Mild mitral regurgitation   . Osteoporosis   . Stomach ulcer   . Vascular disease    Sees Dr. Delana Meyer  . Vertigo    Last episode approx Aug 2015  . Wears dentures    full upper    Procedure(s): LOWER EXTREMITY ANGIOGRAPHY  Discharged Condition: good  HPI:  Patient with RIGHT Fem-Tib Bypass occlusion and rest pain.  Hospital Course:  Sara [name] is a 69 y.o. female is S/P Right Procedure(s): LOWER EXTREMITY ANGIOGRAPHY Procedure(s) Performed: 1. Introduction catheter into right lower extremity 3rd order catheter placement  2. Contrast injection right lower extremity for distal runoff   3. Percutaneous transluminal angioplasty and stent placement right femoral to tibial bypass graft   4.  Percutaneous transluminal angioplasty and stent placement right peroneal artery.              5.  Mechanical thrombectomy of the femoral to tibial bypass graft using the penumbra CAT 6 device              6.  Star close closure left common femoral arteriotomy   Physical exam: Gen: Alert, NAD; CV: RR; RIGHT Leg- warm, +PT, Left Access site, soft, no hematoma Post-op wounds clean, dry, intact or healing well Pt. Ambulating with walker, voiding and taking PO diet without difficulty. Pt pain controlled with PO pain meds. Labs as  below Complications:none  Consults: none   Significant Diagnostic Studies: CBC Lab Results  Component Value Date   WBC 4.5 07/03/2020   HGB 12.1 07/03/2020   HCT 36.5 07/03/2020   MCV 95.1 07/03/2020   PLT 132 (L) 07/03/2020    BMET    Component Value Date/Time   NA 136 07/03/2020 0553   NA 139 09/21/2015 0916   K 4.1 07/03/2020 0553   CL 105 07/03/2020 0553   CO2 25 07/03/2020 0553   GLUCOSE 113 (H) 07/03/2020 0553   BUN 16 07/03/2020 0553   BUN 12 09/21/2015 0916   CREATININE 0.88 07/03/2020 0553   CREATININE 0.88 05/16/2016 1034   CALCIUM 8.3 (L) 07/03/2020 0553   GFRNONAA >60 07/03/2020 0553   GFRNONAA 66 05/01/2016 0846   GFRAA >60 02/18/2020 0949   GFRAA 76 05/01/2016 0846   COAG Lab Results  Component Value Date   INR 1.3 (H) 02/18/2020   INR 1.0 07/11/2019   INR 1.0 06/04/2019     Disposition:  Discharge to :Home with Home PT Discharge Instructions    Call MD for:  redness, tenderness, or signs of infection (pain, swelling, bleeding, redness, odor or green/yellow discharge around incision site)   Complete by: As directed    Call MD for:  severe or increased pain, loss or decreased feeling  in affected limb(s)   Complete by: As directed    Call MD for:  temperature >100.5   Complete by: As directed    Discharge  instructions   Complete by: As directed    Call or return for increased pain, numbness in leg or bleeding redness or swelling at access site. May Shower   Driving Restrictions   Complete by: As directed    No driving for 2 weeks   Lifting restrictions   Complete by: As directed    No lifting for 2 weeks   No dressing needed   Complete by: As directed    Replace only if drainage present   Resume previous diet   Complete by: As directed      Allergies as of 07/04/2020      Reactions   Amoxicillin Other (See Comments)   Yeast infection   Metoprolol Tartrate Rash   Oxycodone Itching   Vicodin [hydrocodone-acetaminophen] Hives,  Rash   Flushing      Medication List    STOP taking these medications   clopidogrel 75 MG tablet Commonly known as: PLAVIX     TAKE these medications   Actemra ACTPen 162 MG/0.9ML Soaj Generic drug: Tocilizumab Inject 162 mg into the skin once a week.   apixaban 5 MG Tabs tablet Commonly known as: Eliquis Take 1 tablet (5 mg total) by mouth 2 (two) times daily.   aspirin 81 MG EC tablet Take 1 tablet (81 mg total) by mouth daily. Swallow whole.   atorvastatin 40 MG tablet Commonly known as: LIPITOR Take 40 mg by mouth at bedtime.   calcium carbonate 1500 (600 Ca) MG Tabs tablet Commonly known as: OSCAL Take 1,200 mg of elemental calcium by mouth 2 (two) times daily with a meal.   Calcium+Menaq7 500-200-90 MG-UNT-MCG Tabs Generic drug: Calcium-Vitamin D-Vitamin K Take by mouth.   cholecalciferol 25 MCG (1000 UNIT) tablet Commonly known as: VITAMIN D3 Take 1,000 Units by mouth daily.   clobetasol cream 0.05 % Commonly known as: TEMOVATE Apply topically.   diltiazem 240 MG 24 hr capsule Commonly known as: CARDIZEM CD Take 240 mg by mouth daily.   ibandronate 150 MG tablet Commonly known as: BONIVA Take 150 mg by mouth every 30 (thirty) days.   ketorolac 0.5 % ophthalmic solution Commonly known as: ACULAR 1 drop 4 (four) times daily.   losartan 25 MG tablet Commonly known as: COZAAR Take 25 mg by mouth every morning.   ondansetron 4 MG tablet Commonly known as: Zofran Take 1 tablet (4 mg total) by mouth every 8 (eight) hours as needed for nausea or vomiting.   oxyCODONE 5 MG immediate release tablet Commonly known as: Oxy IR/ROXICODONE Take 1 tablet (5 mg total) by mouth every 6 (six) hours as needed for up to 5 days for moderate pain or severe pain. What changed:   when to take this  reasons to take this   pantoprazole 40 MG tablet Commonly known as: PROTONIX TAKE 1 TABLET DAILY   SYSTANE COMPLETE OP Place 1 drop into the right eye in the  morning, at noon, and at bedtime.   venlafaxine XR 150 MG 24 hr capsule Commonly known as: EFFEXOR-XR Take 150 mg by mouth daily with breakfast.   zolpidem 5 MG tablet Commonly known as: AMBIEN Take 5 mg by mouth at bedtime as needed for sleep.      Verbal and written Discharge instructions given to the patient. Wound care per Discharge AVS   Signed: Evaristo Bury, MD  07/04/2020, 8:30 AM

## 2020-07-04 NOTE — TOC Initial Note (Addendum)
Transition of Care [facility]) - Initial/Assessment Note    Patient Details  Name: Sara [name] MRN: [id number] Date of Birth: 1950-12-25  Transition of Care [facility]) CM/SW Contact:    Izola Price, RN Phone Number: 07/04/2020, 9:31 AM  Clinical Narrative:      11/14 Update: [facility] confirmed [facility] by Floydene Flock. 0945 am.  11/14 Discharge orders for [facility] PT with RW.  Contacted [employer] for RW, and prefereed [place] agency patient had used before--[facility].  Call into [facility] left VM. Spoke with patient on the phone.  Lives with Friend Friend picking patient up today. Lanae Boast.  No transportation/medication issues.  PCP [facility] Surgeon Dr. Delana Meyer for vascular issues this admission.  Independent PTA.   Pt seems very upbeat and motivated.  Goal was achieved as "blockage in my leg was removed"    Simmie Davies RN CM          Expected Discharge Plan: [place] Barriers to Discharge: Barriers Resolved   Patient Goals and CMS Choice Patient states their goals for this hospitalization and ongoing recovery are:: Blockage in my leg was removed.      Expected Discharge Plan and Services Expected Discharge Plan: [place]   Discharge Planning Services: CM Consult Post Acute Care Choice: Durable Medical Equipment, Home Health   Expected Discharge Date: 07/04/20               DME Arranged: Gilford Rile rolling DME Agency: Other - Comment Celesta Aver) Date DME Agency Contacted: 07/04/20 Time DME Agency Contacted: 0900 Representative spoke with at DME Agency: Brenton Grills HH Arranged: PT          Prior Living Arrangements/Services   Lives with:: Self, Roommate Patient language and need for interpreter reviewed:: Yes        Need for Family Participation in Patient Care: Yes (Comment) Care giver support system in place?: Yes (comment)   Criminal Activity/Legal Involvement Pertinent to Current  Situation/Hospitalization: No - Comment as needed  Activities of Daily Living Home Assistive Devices/Equipment: None ADL Screening (condition at time of admission) Patient's cognitive ability adequate to safely complete daily activities?: Yes Is the patient deaf or have difficulty hearing?: No Does the patient have difficulty seeing, even when wearing glasses/contacts?: No Does the patient have difficulty concentrating, remembering, or making decisions?: No Patient able to express need for assistance with ADLs?: No Does the patient have difficulty dressing or bathing?: No Independently performs ADLs?: Yes (appropriate for developmental age) Does the patient have difficulty walking or climbing stairs?: Yes Weakness of Legs: Right Weakness of Arms/Hands: Right  Permission Sought/Granted Permission sought to share information with : Case Manager, Customer service manager Permission granted to share information with : Yes, Verbal Permission Granted     Permission granted to share info w AGENCY: Home Health Agencies        Emotional Assessment Appearance:: Appears stated age Attitude/Demeanor/Rapport: Engaged Affect (typically observed): Accepting, Appropriate Orientation: : Oriented to Self, Oriented to Place, Oriented to  Time, Oriented to Situation Alcohol / Substance Use: Other (comment) Psych Involvement: No (comment)  Admission diagnosis:  Ischemia of extremity [I99.8] Patient Active Problem List   Diagnosis Date Noted  . Ischemia of extremity 07/02/2020  . Chronic pain in right shoulder 11/25/2019  . Encounter for long-term (current) use of high-risk medication 07/16/2019  . GCA (giant cell arteritis) ([place]) 07/16/2019  . Temporal arteritis ([place]) 07/13/2019  . Postoperative wound infection 06/23/2019  .  Ischemic leg 05/31/2019  . Atherosclerosis of native arteries of extremity with intermittent claudication ([place]) 05/30/2019  . Depression 05/29/2019  . Eczema of  lower extremity 03/04/2018  . Chronic venous insufficiency 03/02/2018  . Personal history of colonic polyps   . Family history of colonic polyps   . Benign neoplasm of ascending colon   . Mitral valve insufficiency 02/08/2017  . Dyspnea on exertion 02/07/2017  . Non-rheumatic mitral regurgitation 02/07/2017  . Precordial pain 02/07/2017  . CAD (coronary artery disease) 12/21/2016  . Essential hypertension 12/21/2016  . Compression fracture of lumbar spine, non-traumatic ([place]) 04/26/2016  . Compression fracture of L3 lumbar vertebra 04/20/2016  . PVD (peripheral vascular disease) ([place]) 02/24/2016  . Family history of premature CAD 02/24/2016  . Anticoagulated on warfarin 02/21/2016  . Gastritis   . Other specified diseases of esophagus   . Hiatal hernia   . Gastritis and gastroduodenitis   . Ischemia of lower extremity   . Arterial occlusion ([place])   . Atherosclerosis of aorta ([place])   . History of smoking   . Hyperlipidemia   . Pain in the chest   . 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 Carotid artery stenosis 01/04/2016  . Vertigo, benign paroxysmal 12/28/2015  . Clinical depression 09/06/2015  . History of alcoholism ([place]) 09/06/2015  . Angiopathy, 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[place]) 09/06/2015  . Peripheral vascular disease ([place]) 09/06/2015  . Acute non-recurrent maxillary sinusitis 07/14/2015  . Vaginal pruritus 05/26/2015  . Chronic recurrent major depressive disorder ([place]) 03/09/2015  . 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 Peripheral blood vessel disorder ([place]) 03/09/2015  . Acid reflux 03/09/2015  . GERD (gastroesophageal reflux disease) 12/21/2014  . Carotid artery narrowing 01/28/2014  . Peripheral arterial occlusive disease ([place]) 06/08/2011  . Occlusion and stenosis of unspecified carotid artery 06/08/2011  . PAD (peripheral artery disease) ([place]) 06/08/2011   PCP:  Danelle Berry, NP Pharmacy:   [facility], [address] to [address] [place] [address] Phone: [phone] Fax: [phone]  [id number] [place], [address] [place] [address] Phone: [phone] Fax: [phone]     Social Determinants of Health (SDOH) Interventions    Readmission Risk Interventions No flowsheet data found.

## 2020-07-04 NOTE — TOC Transition Note (Signed)
Transition of Care [facility]) - CM/SW Discharge Note   Patient Details  Name: Sara [name] MRN: [id number] Date of Birth: 1950/12/10  Transition of Care [facility]) CM/SW Contact:  Izola Price, RN Phone Number: 07/04/2020, 9:50 AM   Clinical Narrative:     [facility] Floyd[facility] Flock [facility]. This was patient's preferred choice.  [employer] to deliver DME.   Spoke with patient on the phone. No further concerns regarding discharge. States goals were met.   CM will sign off but available if needed. Pt. Has CM number. Notified RN.   Simmie Davies RN CM.     Final next level of care: [place] Barriers to Discharge: Barriers Resolved   Patient Goals and CMS Choice Patient states their goals for this hospitalization and ongoing recovery are:: Blockage in my leg was removed.      Discharge Placement                       Discharge Plan and Services   Discharge Planning Services: CM Consult Post Acute Care Choice: Durable Medical Equipment, Home Health          DME Arranged: [place] rolling DME Agency: Other - Comment Celesta Aver) Date DME Agency Contacted: 07/04/20 Time DME Agency Contacted: 0900 Representative spoke with at DME Agency: Brenton Grills HH Arranged: PT [place]: [place] ([place]) Date [place]: 07/04/20 Time [place]: [phone] Representative spoke with at [place]: [place] ([place]) Interventions     Readmission Risk Interventions No flowsheet data found.

## 2020-07-05 ENCOUNTER — Encounter: Payer: Self-pay | Admitting: Vascular Surgery

## 2020-07-06 ENCOUNTER — Telehealth (INDEPENDENT_AMBULATORY_CARE_PROVIDER_SITE_OTHER): Payer: Self-pay

## 2020-07-06 DIAGNOSIS — I70219 Atherosclerosis of native arteries of extremities with intermittent claudication, unspecified extremity: Secondary | ICD-10-CM

## 2020-07-06 NOTE — Telephone Encounter (Signed)
Chris with physical therapy left a message requesting verbal orders for 2 times a week for 1 week,1 time a week for 1 week,and 2 times a week for 2 weeks. I spoke with Eulogio Ditch NP and she is fine with requesting orders. A detailed message was left on [employer].

## 2020-07-09 ENCOUNTER — Other Ambulatory Visit (INDEPENDENT_AMBULATORY_CARE_PROVIDER_SITE_OTHER): Payer: Self-pay | Admitting: Nurse Practitioner

## 2020-07-14 ENCOUNTER — Telehealth (INDEPENDENT_AMBULATORY_CARE_PROVIDER_SITE_OTHER): Payer: Self-pay

## 2020-07-14 NOTE — Telephone Encounter (Signed)
Patient left a voicemail stating that there physical therapist had just left her home and recommended her to call our office. The patient toes on the right foot are cold.she can walk a little distance but she has to stop,and the toes and foot are numb. I spoke with Eulogio Ditch NP and she recommended the patient to be evaulated at the ED. Patient was made aware with medical advice.

## 2020-07-19 ENCOUNTER — Ambulatory Visit (INDEPENDENT_AMBULATORY_CARE_PROVIDER_SITE_OTHER): Payer: Medicare Other | Admitting: Vascular Surgery

## 2020-07-19 ENCOUNTER — Other Ambulatory Visit: Payer: Self-pay

## 2020-07-19 VITALS — BP 102/69 | HR 108 | Ht 63.0 in | Wt 127.0 lb

## 2020-07-19 DIAGNOSIS — I10 Essential (primary) hypertension: Secondary | ICD-10-CM

## 2020-07-19 DIAGNOSIS — K219 Gastro-esophageal reflux disease without esophagitis: Secondary | ICD-10-CM

## 2020-07-19 DIAGNOSIS — I70221 Atherosclerosis of native arteries of extremities with rest pain, right leg: Secondary | ICD-10-CM

## 2020-07-19 DIAGNOSIS — I779 Disorder of arteries and arterioles, unspecified: Secondary | ICD-10-CM

## 2020-07-19 DIAGNOSIS — E7849 Other hyperlipidemia: Secondary | ICD-10-CM | POA: Diagnosis not present

## 2020-07-19 NOTE — Progress Notes (Signed)
MRN : [id number]  Sara [name] is a 69 y.o. (04-27-1951) female who presents with chief complaint of No chief complaint on file. Marland Kitchen  History of Present Illness:   The patient returns to the office for followup and review status post angiogram with intervention on 07/02/2020.  1.  Percutaneous transluminal angioplasty and stent placement right femoral to tibial bypass graft  2.  Percutaneous transluminal angioplasty and stent placement right peroneal artery. 3.  Mechanical thrombectomy of the femoral to tibial bypass graft using the penumbra CAT 6 device   The patient noted that her pain had been relieved initially however approximately 1 week ago she experienced the abrupt onset of pain in her foot and ankle similar to that that she presented with when her bypass at thrombosed.  She also notes that her toes now feel cold and numb.  No new ulcers or wounds have occurred since the last visit.  There have been no significant changes to the patient's overall health care.  The patient denies amaurosis fugax or recent TIA symptoms. There are no recent neurological changes noted. The patient denies history of DVT, PE or superficial thrombophlebitis. The patient denies recent episodes of angina or shortness of breath.    No outpatient medications have been marked as taking for the 07/19/20 encounter (Appointment) with Delana Meyer, Dolores Lory, MD.    Past Medical History:  Diagnosis Date  . Arthritis    right shoulder  . Depression   . Dyspnea   . GERD (gastroesophageal reflux disease)   . History of blood clots   . Hyperlipidemia   . Hypertension   . Mild mitral regurgitation   . Osteoporosis   . Stomach ulcer   . Vascular disease    Sees Dr. Delana Meyer  . Vertigo    Last episode approx Aug 2015  . Wears dentures    full upper    Past Surgical History:  Procedure Laterality Date  . ADENOIDECTOMY    . ARTERY BIOPSY Right 07/14/2019   Procedure: BIOPSY TEMPORAL ARTERY;  Surgeon:  [name], MD;  Location: [facility];  Service: Vascular;  Laterality: Right;  . BREAST CYST ASPIRATION Left   . CARDIAC CATHETERIZATION  02/15/2017   [facility]  . COLONOSCOPY WITH PROPOFOL N/A 10/22/2017   Procedure: COLONOSCOPY WITH PROPOFOL;  Surgeon: Lucilla Lame, MD;  Location: [place];  Service: Endoscopy;  Laterality: N/A;  specimens not taken--pt on Plavix will be brought back in after 7 days off med  . COLONOSCOPY WITH PROPOFOL N/A 11/05/2017   Procedure: COLONOSCOPY WITH PROPOFOL;  Surgeon: Lucilla Lame, MD;  Location: [place];  Service: Endoscopy;  Laterality: N/A;  . ESOPHAGOGASTRODUODENOSCOPY N/A 12/21/2014   Procedure: ESOPHAGOGASTRODUODENOSCOPY (EGD);  Surgeon: Lucilla Lame, MD;  Location: [place];  Service: Gastroenterology;  Laterality: N/A;  . ESOPHAGOGASTRODUODENOSCOPY (EGD) WITH PROPOFOL N/A 02/08/2016   Procedure: ESOPHAGOGASTRODUODENOSCOPY (EGD) WITH PROPOFOL;  Surgeon: Lucilla Lame, MD;  Location: [facility];  Service: Endoscopy;  Laterality: N/A;  . FASCIOTOMY Right 05/30/2019   Procedure: FASCIOTOMY;  Surgeon: [name], MD;  Location: [facility];  Service: Vascular;  Laterality: Right;  . FASCIOTOMY CLOSURE Right 06/04/2019   Procedure: FASCIOTOMY CLOSURE;  Surgeon: [name], MD;  Location: [facility];  Service: Vascular;  Laterality: Right;  . HEMORROIDECTOMY  2014  . LOWER EXTREMITY ANGIOGRAPHY Right 05/30/2019   Procedure: LOWER EXTREMITY ANGIOGRAPHY;  Surgeon: [name], MD;  Location: [place] [facility];  Service: Cardiovascular;  Laterality: Right;  . LOWER EXTREMITY ANGIOGRAPHY Right 07/02/2020   Procedure: LOWER EXTREMITY ANGIOGRAPHY;  Surgeon: [name], MD;  Location: [place] [facility];  Service: Cardiovascular;  Laterality: Right;  . PERIPHERAL VASCULAR CATHETERIZATION N/A 02/09/2016   Procedure: Abdominal Aortogram w/Lower Extremity;  Surgeon: [name], MD;  Location: [place]  [facility];  Service: Cardiovascular;  Laterality: N/A;  . PERIPHERAL VASCULAR CATHETERIZATION Right 02/10/2016   Procedure: Lower Extremity Angiography;  Surgeon: [name], MD;  Location: [place] [facility];  Service: Cardiovascular;  Laterality: Right;  . POLYPECTOMY  11/05/2017   Procedure: POLYPECTOMY;  Surgeon: Lucilla Lame, MD;  Location: [place];  Service: Endoscopy;;  . SHOULDER ARTHROSCOPY WITH ROTATOR CUFF REPAIR AND SUBACROMIAL DECOMPRESSION Right 02/27/2020   Procedure: RIGHT SHOULDER ARTHROSCOPY SUBACROMIAL DECOMPRESSION, DISTAL CLAVICLE EXCISION AND MINI-OPEN ROTATOR CUFF REPAIR;  Surgeon: Thornton Park, MD;  Location: [facility];  Service: Orthopedics;  Laterality: Right;  . TONSILLECTOMY    . VASCULAR SURGERY  [id number]   Fem-Pop Bypass    Social History Social History   Tobacco Use  . Smoking status: Former Smoker    Packs/day: 2.00    Years: 25.00    Pack years: 50.00    Types: Cigarettes    Quit date: 08/22/1991    Years since quitting: 28.9  . Smokeless tobacco: Never Used  Vaping Use  . Vaping Use: Never used  Substance Use Topics  . Alcohol use: No    Alcohol/week: 0.0 standard drinks  . Drug use: Never    Family History Family History  Problem Relation Age of Onset  . CVA Mother   . Heart attack Mother   . Cancer Father        colon cancer  . Colon cancer Father   . Heart disease Maternal Uncle   . Breast cancer Neg Hx     Allergies  Allergen Reactions  . Amoxicillin Other (See Comments)    Yeast infection  . Metoprolol Tartrate Rash  . Oxycodone Itching  . Vicodin [Hydrocodone-Acetaminophen] Hives and Rash    Flushing     REVIEW OF SYSTEMS (Negative unless checked)  Constitutional: [] Weight loss  [] Fever  [] Chills Cardiac: [] Chest pain   [] Chest pressure   [] Palpitations   [] Shortness of breath when laying flat   [] Shortness of breath with exertion. Vascular:  [] Pain in legs with walking   [] Pain in legs at rest   [] History of DVT   [] Phlebitis   [] Swelling in legs   [] Varicose veins   [] Non-healing ulcers Pulmonary:   [] Uses home oxygen   [] Productive cough   [] Hemoptysis   [] Wheeze  [] COPD   [] Asthma Neurologic:  [] Dizziness   [] Seizures   [] History of stroke   [] History of TIA  [] Aphasia   [] Vissual changes   [] Weakness or numbness in arm   [] Weakness or numbness in leg Musculoskeletal:   [] Joint swelling   [] Joint pain   [] Low back pain Hematologic:  [] Easy bruising  [] Easy bleeding   [] Hypercoagulable state   [] Anemic Gastrointestinal:  [] Diarrhea   [] Vomiting  [x] Gastroesophageal reflux/heartburn   [] Difficulty swallowing. Genitourinary:  [] Chronic kidney disease   [] Difficult urination  [] Frequent urination   [] Blood in urine Skin:  [] Rashes   [] Ulcers  Psychological:  [] History of anxiety   []  History of major depression.  Physical Examination  There were no vitals filed for this visit. There is no height or weight on file to calculate BMI. Gen: WD/WN, NAD Head: [place]/AT,  No temporalis wasting.  Ear/Nose/Throat: Hearing grossly intact, nares w/o erythema or drainage Eyes: PER, EOMI, sclera nonicteric.  Neck: Supple, no large masses.   Pulmonary:  Good air movement, no audible wheezing bilaterally, no use of accessory muscles.  Cardiac: RRR, no JVD Vascular: Right foot is cool to the touch with greater than 3-second capillary refill Vessel Right Left  Radial Palpable Palpable  PT Not Palpable Not Palpable  DP Not Palpable Not Palpable  Gastrointestinal: Non-distended. No guarding/no peritoneal signs.  Musculoskeletal: M/S 5/5 throughout.  No deformity or atrophy.  Neurologic: CN 2-12 intact. Symmetrical.  Speech is fluent. Motor exam as listed above. Psychiatric: Judgment intact, Mood & affect appropriate for pt's clinical situation. Dermatologic: No rashes or ulcers noted.  No changes consistent with cellulitis.  CBC Lab Results  Component Value Date   WBC 4.5 07/03/2020   HGB 12.1  07/03/2020   HCT 36.5 07/03/2020   MCV 95.1 07/03/2020   PLT 132 (L) 07/03/2020    BMET    Component Value Date/Time   NA 136 07/03/2020 0553   NA 139 09/21/2015 0916   K 4.1 07/03/2020 0553   CL 105 07/03/2020 0553   CO2 25 07/03/2020 0553   GLUCOSE 113 (H) 07/03/2020 0553   BUN 16 07/03/2020 0553   BUN 12 09/21/2015 0916   CREATININE 0.88 07/03/2020 0553   CREATININE 0.88 05/16/2016 1034   CALCIUM 8.3 (L) 07/03/2020 0553   GFRNONAA >60 07/03/2020 0553   GFRNONAA 66 05/01/2016 0846   GFRAA >60 02/18/2020 0949   GFRAA 76 05/01/2016 0846   CrCl cannot be calculated (Unknown ideal weight.).  COAG Lab Results  Component Value Date   INR 1.3 (H) 02/18/2020   INR 1.0 07/11/2019   INR 1.0 06/04/2019    Radiology PERIPHERAL VASCULAR CATHETERIZATION  Result Date: 07/02/2020 See op note    Assessment/Plan 1. Atherosclerosis of native artery of right lower extremity with rest pain ([place])  Recommend:  The patient has evidence of atherosclerosis of the lower extremities with rest pain.  Clinically, I suspect that her bypass has thrombosed once again.  I have reviewed the images from the angiogram with intervention from just 2 weeks ago.  At this point I do not believe that further intervention will result in a durable reconstruction.  She does not have conduit for a repeat bypass.  I have discussed the possibility of amputation with her..  Prior to committing to amputation I will obtain a duplex ultrasound of the right leg arterial system on the outside chance that the bypass is still open but with very poor flow secondary to treatable lesion.  No invasive studies, angiography or surgery at this time The patient should continue walking and begin a more formal exercise program.  The patient should continue antiplatelet therapy and aggressive treatment of the lipid abnormalities  No changes in the patient's medications at this time  - VAS [place] LOWER EXTREMITY ARTERIAL DUPLEX;  Future  2. Essential hypertension Continue antihypertensive medications as already ordered, these medications have been reviewed and there are no changes at this time.   3. Other hyperlipidemia Continue statin as ordered and reviewed, no changes at this time   4. Gastroesophageal reflux disease, unspecified whether esophagitis present Continue PPI as already ordered, this medication has been reviewed and there are no changes at this time.  Avoidence of caffeine and alcohol  Moderate elevation of the head of the bed    [name], MD  07/19/2020 1:11 PM

## 2020-07-20 ENCOUNTER — Encounter (INDEPENDENT_AMBULATORY_CARE_PROVIDER_SITE_OTHER): Payer: Self-pay | Admitting: Vascular Surgery

## 2020-08-02 ENCOUNTER — Ambulatory Visit (INDEPENDENT_AMBULATORY_CARE_PROVIDER_SITE_OTHER): Payer: Medicare Other | Admitting: Nurse Practitioner

## 2020-08-02 ENCOUNTER — Other Ambulatory Visit: Payer: Self-pay

## 2020-08-02 ENCOUNTER — Ambulatory Visit (INDEPENDENT_AMBULATORY_CARE_PROVIDER_SITE_OTHER): Payer: Medicare Other

## 2020-08-02 ENCOUNTER — Encounter (INDEPENDENT_AMBULATORY_CARE_PROVIDER_SITE_OTHER): Payer: Self-pay | Admitting: Nurse Practitioner

## 2020-08-02 VITALS — BP 110/73 | HR 94 | Ht 63.0 in | Wt 126.0 lb

## 2020-08-02 DIAGNOSIS — I70221 Atherosclerosis of native arteries of extremities with rest pain, right leg: Secondary | ICD-10-CM | POA: Diagnosis not present

## 2020-08-02 DIAGNOSIS — E7849 Other hyperlipidemia: Secondary | ICD-10-CM | POA: Diagnosis not present

## 2020-08-02 DIAGNOSIS — I10 Essential (primary) hypertension: Secondary | ICD-10-CM

## 2020-08-02 NOTE — H&P (View-Only) (Signed)
Subjective:    Patient ID: Sara [name], female    DOB: 1951/04/15, 69 y.o.   MRN: [id number] Chief Complaint  Patient presents with  . Follow-up    U/S follow up    The patient presents today for discussion of noninvasive studies.  The patient has a long history of peripheral vascular interventions.  The most recent was on 07/02/2020.  The patient has a right fem-tib bypass graft which was only open.  The following intervention the patient notes that she had a decrease in pain for approximately 1 week and then suddenly she began to have a cold foot with numbness and pain.  It was initially felt that the graft was likely occluded but we wanted noninvasive studies to confirm that there was no possible room for intervention.  She denies any fever, chills, nausea, vomiting or diarrhea.  She does have a cold right lower extremity with rest pain and claudication.  Today noninvasive studies show that the right Pham tibial bypass graft with new stents is totally occluded.  Minimal flow is seen within the distal posterior tibial artery.  The peroneal is also occluded but with minimal collateral flow seen.  The anterior tibial artery is also occluded.   Review of Systems  Skin: Positive for color change and pallor.  All other systems reviewed and are negative.      Objective:   Physical Exam Vitals reviewed.  HENT:     Head: Normocephalic.  Cardiovascular:     Pulses:          Dorsalis pedis pulses are 0 on the right side.       Posterior tibial pulses are 0 on the right side.  Pulmonary:     Effort: Pulmonary effort is normal.  Skin:    General: Skin is cool.     Coloration: Skin is pale.  Neurological:     Mental Status: She is alert and oriented to person, place, and time.  Psychiatric:        Mood and Affect: Mood normal.        Behavior: Behavior normal.        Thought Content: Thought content normal.        Judgment: Judgment normal.     BP 110/73   Pulse 94   Ht 5\' 3"   (1.6 m)   Wt 126 lb (57.2 kg)   BMI 22.32 kg/m   Past Medical History:  Diagnosis Date  . Arthritis    right shoulder  . Depression   . Dyspnea   . GERD (gastroesophageal reflux disease)   . History of blood clots   . Hyperlipidemia   . Hypertension   . Mild mitral regurgitation   . Osteoporosis   . Stomach ulcer   . Vascular disease    Sees Dr. Delana Meyer  . Vertigo    Last episode approx Aug 2015  . Wears dentures    full upper    Social History   Socioeconomic History  . Marital status: Single    Spouse name: Not on file  . Number of children: Not on file  . Years of education: Not on file  . Highest education level: Not on file  Occupational History  . Not on file  Tobacco Use  . Smoking status: Former Smoker    Packs/day: 2.00    Years: 25.00    Pack years: 50.00    Types: Cigarettes    Quit date: 08/22/1991    Years since  quitting: 28.9  . Smokeless tobacco: Never Used  Vaping Use  . Vaping Use: Never used  Substance and Sexual Activity  . Alcohol use: No    Alcohol/week: 0.0 standard drinks  . Drug use: Never  . Sexual activity: Not Currently  Other Topics Concern  . Not on file  Social History Narrative  . Not on file   Social Determinants of Health   Financial Resource Strain: Not on file  Food Insecurity: Not on file  Transportation Needs: Not on file  Physical Activity: Not on file  Stress: Not on file  Social Connections: Not on file  Intimate Partner Violence: Not on file    Past Surgical History:  Procedure Laterality Date  . ADENOIDECTOMY    . ARTERY BIOPSY Right 07/14/2019   Procedure: BIOPSY TEMPORAL ARTERY;  Surgeon: [name], MD;  Location: [facility];  Service: Vascular;  Laterality: Right;  . BREAST CYST ASPIRATION Left   . CARDIAC CATHETERIZATION  02/15/2017   [facility]  . COLONOSCOPY WITH PROPOFOL N/A 10/22/2017   Procedure: COLONOSCOPY WITH PROPOFOL;  Surgeon: Lucilla Lame, MD;  Location: [place];  Service:  Endoscopy;  Laterality: N/A;  specimens not taken--pt on Plavix will be brought back in after 7 days off med  . COLONOSCOPY WITH PROPOFOL N/A 11/05/2017   Procedure: COLONOSCOPY WITH PROPOFOL;  Surgeon: Lucilla Lame, MD;  Location: [place];  Service: Endoscopy;  Laterality: N/A;  . ESOPHAGOGASTRODUODENOSCOPY N/A 12/21/2014   Procedure: ESOPHAGOGASTRODUODENOSCOPY (EGD);  Surgeon: Lucilla Lame, MD;  Location: [place];  Service: Gastroenterology;  Laterality: N/A;  . ESOPHAGOGASTRODUODENOSCOPY (EGD) WITH PROPOFOL N/A 02/08/2016   Procedure: ESOPHAGOGASTRODUODENOSCOPY (EGD) WITH PROPOFOL;  Surgeon: Lucilla Lame, MD;  Location: [facility];  Service: Endoscopy;  Laterality: N/A;  . FASCIOTOMY Right 05/30/2019   Procedure: FASCIOTOMY;  Surgeon: [name], MD;  Location: [facility];  Service: Vascular;  Laterality: Right;  . FASCIOTOMY CLOSURE Right 06/04/2019   Procedure: FASCIOTOMY CLOSURE;  Surgeon: [name], MD;  Location: [facility];  Service: Vascular;  Laterality: Right;  . HEMORROIDECTOMY  2014  . LOWER EXTREMITY ANGIOGRAPHY Right 05/30/2019   Procedure: LOWER EXTREMITY ANGIOGRAPHY;  Surgeon: [name], MD;  Location: [place] [facility];  Service: Cardiovascular;  Laterality: Right;  . LOWER EXTREMITY ANGIOGRAPHY Right 07/02/2020   Procedure: LOWER EXTREMITY ANGIOGRAPHY;  Surgeon: [name], MD;  Location: [place] [facility];  Service: Cardiovascular;  Laterality: Right;  . PERIPHERAL VASCULAR CATHETERIZATION N/A 02/09/2016   Procedure: Abdominal Aortogram w/Lower Extremity;  Surgeon: [name], MD;  Location: [place] [facility];  Service: Cardiovascular;  Laterality: N/A;  . PERIPHERAL VASCULAR CATHETERIZATION Right 02/10/2016   Procedure: Lower Extremity Angiography;  Surgeon: [name], MD;  Location: [place] [facility];  Service: Cardiovascular;  Laterality: Right;  . POLYPECTOMY  11/05/2017   Procedure: POLYPECTOMY;   Surgeon: Lucilla Lame, MD;  Location: [place];  Service: Endoscopy;;  . SHOULDER ARTHROSCOPY WITH ROTATOR CUFF REPAIR AND SUBACROMIAL DECOMPRESSION Right 02/27/2020   Procedure: RIGHT SHOULDER ARTHROSCOPY SUBACROMIAL DECOMPRESSION, DISTAL CLAVICLE EXCISION AND MINI-OPEN ROTATOR CUFF REPAIR;  Surgeon: Thornton Park, MD;  Location: [facility];  Service: Orthopedics;  Laterality: Right;  . TONSILLECTOMY    . VASCULAR SURGERY  [id number]   Fem-Pop Bypass    Family History  Problem Relation Age of Onset  . CVA Mother   . Heart attack Mother   . Cancer Father  colon cancer  . Colon cancer Father   . Heart disease Maternal Uncle   . Breast cancer Neg Hx     Allergies  Allergen Reactions  . Amoxicillin Other (See Comments)    Yeast infection  . Metoprolol Tartrate Rash  . Oxycodone Itching  . Vicodin [Hydrocodone-Acetaminophen] Hives and Rash    Flushing    CBC Latest Ref Rng & Units 07/03/2020 02/18/2020 07/03/2019  WBC 4.0 - 10.5 K/uL 4.5 4.3 7.3  Hemoglobin 12.0 - 15.0 g/dL 12.1 12.1 11.9(L)  Hematocrit 36.0 - 46.0 % 36.5 38.9 36.7  Platelets 150 - 400 K/uL 132(L) 227 263      CMP     Component Value Date/Time   NA 136 07/03/2020 0553   NA 139 09/21/2015 0916   K 4.1 07/03/2020 0553   CL 105 07/03/2020 0553   CO2 25 07/03/2020 0553   GLUCOSE 113 (H) 07/03/2020 0553   BUN 16 07/03/2020 0553   BUN 12 09/21/2015 0916   CREATININE 0.88 07/03/2020 0553   CREATININE 0.88 05/16/2016 1034   CALCIUM 8.3 (L) 07/03/2020 0553   PROT 4.4 (L) 06/01/2019 0352   PROT 6.8 09/21/2015 0916   ALBUMIN 2.2 (L) 06/01/2019 0352   ALBUMIN 4.2 09/21/2015 0916   AST 36 06/01/2019 0352   ALT 13 06/01/2019 0352   ALKPHOS 29 (L) 06/01/2019 0352   BILITOT 0.3 06/01/2019 0352   BILITOT 0.3 09/21/2015 0916   GFRNONAA >60 07/03/2020 0553   GFRNONAA 66 05/01/2016 0846   GFRAA >60 02/18/2020 0949   GFRAA 76 05/01/2016 0846     No results found.     Assessment & Plan:    1. Atherosclerosis of native artery of right lower extremity with rest pain ([place])  Recommend:  The patient has evidence of atherosclerosis of the lower extremities with rest pain.    Noninvasive studies done today and confirmed that the patient's bypass graft has thrombosed once again and is currently occluded.    After consulting with Dr. Delana Meyer, At this point I do not believe that further intervention will result in a durable reconstruction.  She does not have conduit for a repeat bypass.    I have discussed amputation with the patient.  Given the situation at hand the patient was given 2 options.  Either she can continue to try to deal with the rest pain until it reaches a critical point or we can proceed with amputation as soon as reasonably possible.  After discussion with the patient, it was decided to proceed as soon as reasonably possible with a right above-knee amputation.  The risks and benefits as well as the alternative therapies was discussed in detail with the patient.  All questions were answered.  Patient agrees to proceed with amputation.  The patient will follow up with me in the office after the procedure.     2. Other hyperlipidemia Continue statin as ordered and reviewed, no changes at this time   3. Essential hypertension Continue antihypertensive medications as already ordered, these medications have been reviewed and there are no changes at this time.    Current Outpatient Medications on File Prior to Visit  Medication Sig Dispense Refill  . apixaban (ELIQUIS) 5 MG TABS tablet Take 1 tablet (5 mg total) by mouth 2 (two) times daily. 8 tablet 0  . aspirin EC 81 MG EC tablet Take 1 tablet (81 mg total) by mouth daily. Swallow whole. 30 tablet 11  . atorvastatin (LIPITOR) 40 MG tablet Take  40 mg by mouth at bedtime.     . clobetasol cream (TEMOVATE) 0.05 % Apply topically.     Marland Kitchen diltiazem (CARDIZEM CD) 240 MG 24 hr capsule Take 240 mg by mouth daily.    Marland Kitchen  ibandronate (BONIVA) 150 MG tablet Take 150 mg by mouth every 30 (thirty) days.     Marland Kitchen losartan (COZAAR) 25 MG tablet Take 25 mg by mouth every morning.     . pantoprazole (PROTONIX) 40 MG tablet TAKE 1 TABLET DAILY (Patient taking differently: Take 40 mg by mouth daily.) 90 tablet 1  . Tocilizumab (ACTEMRA ACTPEN) 162 MG/0.9ML SOAJ Inject 162 mg into the skin once a week.     . venlafaxine XR (EFFEXOR-XR) 150 MG 24 hr capsule Take 150 mg by mouth daily with breakfast.    . zolpidem (AMBIEN) 5 MG tablet Take 5 mg by mouth at bedtime as needed for sleep.     . calcium carbonate (OSCAL) 1500 (600 Ca) MG TABS tablet Take 1,200 mg of elemental calcium by mouth 2 (two) times daily with a meal. (Patient not taking: Reported on 06/30/2020)    . Calcium-Vitamin D-Vitamin K (CALCIUM+MENAQ7) 757-972-82 MG-UNT-MCG TABS Take by mouth. (Patient not taking: Reported on 06/30/2020)    . cholecalciferol (VITAMIN D3) 25 MCG (1000 UNIT) tablet Take 1,000 Units by mouth daily. (Patient not taking: Reported on 06/30/2020)    . ketorolac (ACULAR) 0.5 % ophthalmic solution 1 drop 4 (four) times daily. (Patient not taking: Reported on 06/30/2020)    . ondansetron (ZOFRAN) 4 MG tablet Take 1 tablet (4 mg total) by mouth every 8 (eight) hours as needed for nausea or vomiting. (Patient not taking: Reported on 06/30/2020) 30 tablet 0  . Propylene Glycol (SYSTANE COMPLETE OP) Place 1 drop into the right eye in the morning, at noon, and at bedtime. (Patient not taking: Reported on 07/02/2020)     No current facility-administered medications on file prior to visit.    There are no Patient Instructions on file for this visit. No follow-ups on file.   Kris Hartmann, NP

## 2020-08-02 NOTE — Progress Notes (Signed)
Subjective:    Patient ID: Sara [name], female    DOB: September 06, 1950, 69 y.o.   MRN: [id number] Chief Complaint  Patient presents with  . Follow-up    U/S follow up    The patient presents today for discussion of noninvasive studies.  The patient has a long history of peripheral vascular interventions.  The most recent was on 07/02/2020.  The patient has a right fem-tib bypass graft which was only open.  The following intervention the patient notes that she had a decrease in pain for approximately 1 week and then suddenly she began to have a cold foot with numbness and pain.  It was initially felt that the graft was likely occluded but we wanted noninvasive studies to confirm that there was no possible room for intervention.  She denies any fever, chills, nausea, vomiting or diarrhea.  She does have a cold right lower extremity with rest pain and claudication.  Today noninvasive studies show that the right Pham tibial bypass graft with new stents is totally occluded.  Minimal flow is seen within the distal posterior tibial artery.  The peroneal is also occluded but with minimal collateral flow seen.  The anterior tibial artery is also occluded.   Review of Systems  Skin: Positive for color change and pallor.  All other systems reviewed and are negative.      Objective:   Physical Exam Vitals reviewed.  HENT:     Head: Normocephalic.  Cardiovascular:     Pulses:          Dorsalis pedis pulses are 0 on the right side.       Posterior tibial pulses are 0 on the right side.  Pulmonary:     Effort: Pulmonary effort is normal.  Skin:    General: Skin is cool.     Coloration: Skin is pale.  Neurological:     Mental Status: She is alert and oriented to person, place, and time.  Psychiatric:        Mood and Affect: Mood normal.        Behavior: Behavior normal.        Thought Content: Thought content normal.        Judgment: Judgment normal.     BP 110/73   Pulse 94   Ht 5\' 3"   (1.6 m)   Wt 126 lb (57.2 kg)   BMI 22.32 kg/m   Past Medical History:  Diagnosis Date  . Arthritis    right shoulder  . Depression   . Dyspnea   . GERD (gastroesophageal reflux disease)   . History of blood clots   . Hyperlipidemia   . Hypertension   . Mild mitral regurgitation   . Osteoporosis   . Stomach ulcer   . Vascular disease    Sees Dr. Delana Meyer  . Vertigo    Last episode approx Aug 2015  . Wears dentures    full upper    Social History   Socioeconomic History  . Marital status: Single    Spouse name: Not on file  . Number of children: Not on file  . Years of education: Not on file  . Highest education level: Not on file  Occupational History  . Not on file  Tobacco Use  . Smoking status: Former Smoker    Packs/day: 2.00    Years: 25.00    Pack years: 50.00    Types: Cigarettes    Quit date: 08/22/1991    Years since  quitting: 28.9  . Smokeless tobacco: Never Used  Vaping Use  . Vaping Use: Never used  Substance and Sexual Activity  . Alcohol use: No    Alcohol/week: 0.0 standard drinks  . Drug use: Never  . Sexual activity: Not Currently  Other Topics Concern  . Not on file  Social History Narrative  . Not on file   Social Determinants of Health   Financial Resource Strain: Not on file  Food Insecurity: Not on file  Transportation Needs: Not on file  Physical Activity: Not on file  Stress: Not on file  Social Connections: Not on file  Intimate Partner Violence: Not on file    Past Surgical History:  Procedure Laterality Date  . ADENOIDECTOMY    . ARTERY BIOPSY Right 07/14/2019   Procedure: BIOPSY TEMPORAL ARTERY;  Surgeon: [name], MD;  Location: [facility];  Service: Vascular;  Laterality: Right;  . BREAST CYST ASPIRATION Left   . CARDIAC CATHETERIZATION  02/15/2017   [facility]  . COLONOSCOPY WITH PROPOFOL N/A 10/22/2017   Procedure: COLONOSCOPY WITH PROPOFOL;  Surgeon: Lucilla Lame, MD;  Location: [place];  Service:  Endoscopy;  Laterality: N/A;  specimens not taken--pt on Plavix will be brought back in after 7 days off med  . COLONOSCOPY WITH PROPOFOL N/A 11/05/2017   Procedure: COLONOSCOPY WITH PROPOFOL;  Surgeon: Lucilla Lame, MD;  Location: [place];  Service: Endoscopy;  Laterality: N/A;  . ESOPHAGOGASTRODUODENOSCOPY N/A 12/21/2014   Procedure: ESOPHAGOGASTRODUODENOSCOPY (EGD);  Surgeon: Lucilla Lame, MD;  Location: [place];  Service: Gastroenterology;  Laterality: N/A;  . ESOPHAGOGASTRODUODENOSCOPY (EGD) WITH PROPOFOL N/A 02/08/2016   Procedure: ESOPHAGOGASTRODUODENOSCOPY (EGD) WITH PROPOFOL;  Surgeon: Lucilla Lame, MD;  Location: [facility];  Service: Endoscopy;  Laterality: N/A;  . FASCIOTOMY Right 05/30/2019   Procedure: FASCIOTOMY;  Surgeon: [name], MD;  Location: [facility];  Service: Vascular;  Laterality: Right;  . FASCIOTOMY CLOSURE Right 06/04/2019   Procedure: FASCIOTOMY CLOSURE;  Surgeon: [name], MD;  Location: [facility];  Service: Vascular;  Laterality: Right;  . HEMORROIDECTOMY  2014  . LOWER EXTREMITY ANGIOGRAPHY Right 05/30/2019   Procedure: LOWER EXTREMITY ANGIOGRAPHY;  Surgeon: [name], MD;  Location: [place] [facility];  Service: Cardiovascular;  Laterality: Right;  . LOWER EXTREMITY ANGIOGRAPHY Right 07/02/2020   Procedure: LOWER EXTREMITY ANGIOGRAPHY;  Surgeon: [name], MD;  Location: [place] [facility];  Service: Cardiovascular;  Laterality: Right;  . PERIPHERAL VASCULAR CATHETERIZATION N/A 02/09/2016   Procedure: Abdominal Aortogram w/Lower Extremity;  Surgeon: [name], MD;  Location: [place] [facility];  Service: Cardiovascular;  Laterality: N/A;  . PERIPHERAL VASCULAR CATHETERIZATION Right 02/10/2016   Procedure: Lower Extremity Angiography;  Surgeon: [name], MD;  Location: [place] [facility];  Service: Cardiovascular;  Laterality: Right;  . POLYPECTOMY  11/05/2017   Procedure: POLYPECTOMY;   Surgeon: Lucilla Lame, MD;  Location: [place];  Service: Endoscopy;;  . SHOULDER ARTHROSCOPY WITH ROTATOR CUFF REPAIR AND SUBACROMIAL DECOMPRESSION Right 02/27/2020   Procedure: RIGHT SHOULDER ARTHROSCOPY SUBACROMIAL DECOMPRESSION, DISTAL CLAVICLE EXCISION AND MINI-OPEN ROTATOR CUFF REPAIR;  Surgeon: Thornton Park, MD;  Location: [facility];  Service: Orthopedics;  Laterality: Right;  . TONSILLECTOMY    . VASCULAR SURGERY  [id number]   Fem-Pop Bypass    Family History  Problem Relation Age of Onset  . CVA Mother   . Heart attack Mother   . Cancer Father  colon cancer  . Colon cancer Father   . Heart disease Maternal Uncle   . Breast cancer Neg Hx     Allergies  Allergen Reactions  . Amoxicillin Other (See Comments)    Yeast infection  . Metoprolol Tartrate Rash  . Oxycodone Itching  . Vicodin [Hydrocodone-Acetaminophen] Hives and Rash    Flushing    CBC Latest Ref Rng & Units 07/03/2020 02/18/2020 07/03/2019  WBC 4.0 - 10.5 K/uL 4.5 4.3 7.3  Hemoglobin 12.0 - 15.0 g/dL 12.1 12.1 11.9(L)  Hematocrit 36.0 - 46.0 % 36.5 38.9 36.7  Platelets 150 - 400 K/uL 132(L) 227 263      CMP     Component Value Date/Time   NA 136 07/03/2020 0553   NA 139 09/21/2015 0916   K 4.1 07/03/2020 0553   CL 105 07/03/2020 0553   CO2 25 07/03/2020 0553   GLUCOSE 113 (H) 07/03/2020 0553   BUN 16 07/03/2020 0553   BUN 12 09/21/2015 0916   CREATININE 0.88 07/03/2020 0553   CREATININE 0.88 05/16/2016 1034   CALCIUM 8.3 (L) 07/03/2020 0553   PROT 4.4 (L) 06/01/2019 0352   PROT 6.8 09/21/2015 0916   ALBUMIN 2.2 (L) 06/01/2019 0352   ALBUMIN 4.2 09/21/2015 0916   AST 36 06/01/2019 0352   ALT 13 06/01/2019 0352   ALKPHOS 29 (L) 06/01/2019 0352   BILITOT 0.3 06/01/2019 0352   BILITOT 0.3 09/21/2015 0916   GFRNONAA >60 07/03/2020 0553   GFRNONAA 66 05/01/2016 0846   GFRAA >60 02/18/2020 0949   GFRAA 76 05/01/2016 0846     No results found.     Assessment & Plan:    1. Atherosclerosis of native artery of right lower extremity with rest pain ([place])  Recommend:  The patient has evidence of atherosclerosis of the lower extremities with rest pain.    Noninvasive studies done today and confirmed that the patient's bypass graft has thrombosed once again and is currently occluded.    After consulting with Dr. Delana Meyer, At this point I do not believe that further intervention will result in a durable reconstruction.  She does not have conduit for a repeat bypass.    I have discussed amputation with the patient.  Given the situation at hand the patient was given 2 options.  Either she can continue to try to deal with the rest pain until it reaches a critical point or we can proceed with amputation as soon as reasonably possible.  After discussion with the patient, it was decided to proceed as soon as reasonably possible with a right above-knee amputation.  The risks and benefits as well as the alternative therapies was discussed in detail with the patient.  All questions were answered.  Patient agrees to proceed with amputation.  The patient will follow up with me in the office after the procedure.     2. Other hyperlipidemia Continue statin as ordered and reviewed, no changes at this time   3. Essential hypertension Continue antihypertensive medications as already ordered, these medications have been reviewed and there are no changes at this time.    Current Outpatient Medications on File Prior to Visit  Medication Sig Dispense Refill  . apixaban (ELIQUIS) 5 MG TABS tablet Take 1 tablet (5 mg total) by mouth 2 (two) times daily. 8 tablet 0  . aspirin EC 81 MG EC tablet Take 1 tablet (81 mg total) by mouth daily. Swallow whole. 30 tablet 11  . atorvastatin (LIPITOR) 40 MG tablet Take  40 mg by mouth at bedtime.     . clobetasol cream (TEMOVATE) 0.05 % Apply topically.     Marland Kitchen diltiazem (CARDIZEM CD) 240 MG 24 hr capsule Take 240 mg by mouth daily.    Marland Kitchen  ibandronate (BONIVA) 150 MG tablet Take 150 mg by mouth every 30 (thirty) days.     Marland Kitchen losartan (COZAAR) 25 MG tablet Take 25 mg by mouth every morning.     . pantoprazole (PROTONIX) 40 MG tablet TAKE 1 TABLET DAILY (Patient taking differently: Take 40 mg by mouth daily.) 90 tablet 1  . Tocilizumab (ACTEMRA ACTPEN) 162 MG/0.9ML SOAJ Inject 162 mg into the skin once a week.     . venlafaxine XR (EFFEXOR-XR) 150 MG 24 hr capsule Take 150 mg by mouth daily with breakfast.    . zolpidem (AMBIEN) 5 MG tablet Take 5 mg by mouth at bedtime as needed for sleep.     . calcium carbonate (OSCAL) 1500 (600 Ca) MG TABS tablet Take 1,200 mg of elemental calcium by mouth 2 (two) times daily with a meal. (Patient not taking: Reported on 06/30/2020)    . Calcium-Vitamin D-Vitamin K (CALCIUM+MENAQ7) 397-673-41 MG-UNT-MCG TABS Take by mouth. (Patient not taking: Reported on 06/30/2020)    . cholecalciferol (VITAMIN D3) 25 MCG (1000 UNIT) tablet Take 1,000 Units by mouth daily. (Patient not taking: Reported on 06/30/2020)    . ketorolac (ACULAR) 0.5 % ophthalmic solution 1 drop 4 (four) times daily. (Patient not taking: Reported on 06/30/2020)    . ondansetron (ZOFRAN) 4 MG tablet Take 1 tablet (4 mg total) by mouth every 8 (eight) hours as needed for nausea or vomiting. (Patient not taking: Reported on 06/30/2020) 30 tablet 0  . Propylene Glycol (SYSTANE COMPLETE OP) Place 1 drop into the right eye in the morning, at noon, and at bedtime. (Patient not taking: Reported on 07/02/2020)     No current facility-administered medications on file prior to visit.    There are no Patient Instructions on file for this visit. No follow-ups on file.   Kris Hartmann, NP

## 2020-08-06 ENCOUNTER — Telehealth (INDEPENDENT_AMBULATORY_CARE_PROVIDER_SITE_OTHER): Payer: Self-pay

## 2020-08-06 NOTE — Telephone Encounter (Signed)
Patient was scheduled with Dr. Delana Meyer for a right leg AKA on 08/11/20 at the [facility]. Phone call pre-op on 08/09/20 between 1-5 pm and covid testing on 08/09/20 between 8-1 pm at the [place]. Pre-surgical instructions were discussed and patient understood.

## 2020-08-09 ENCOUNTER — Encounter
Admission: RE | Admit: 2020-08-09 | Discharge: 2020-08-09 | Disposition: A | Discharge status: Home / Self Care | Payer: Medicare Other | Source: Ambulatory Visit | Attending: Vascular Surgery | Admitting: Vascular Surgery

## 2020-08-09 ENCOUNTER — Inpatient Hospital Stay: Admission: RE | Admit: 2020-08-09 | Payer: Medicare Other | Source: Ambulatory Visit

## 2020-08-09 ENCOUNTER — Other Ambulatory Visit (INDEPENDENT_AMBULATORY_CARE_PROVIDER_SITE_OTHER): Payer: Self-pay | Admitting: Nurse Practitioner

## 2020-08-09 ENCOUNTER — Other Ambulatory Visit: Payer: Self-pay

## 2020-08-09 ENCOUNTER — Encounter (INDEPENDENT_AMBULATORY_CARE_PROVIDER_SITE_OTHER): Payer: Self-pay | Admitting: Nurse Practitioner

## 2020-08-09 DIAGNOSIS — Z01818 Encounter for other preprocedural examination: Secondary | ICD-10-CM | POA: Insufficient documentation

## 2020-08-09 DIAGNOSIS — Z20822 Contact with and (suspected) exposure to covid-19: Secondary | ICD-10-CM | POA: Insufficient documentation

## 2020-08-09 HISTORY — DX: Peripheral vascular disease, unspecified: I73.9

## 2020-08-09 LAB — CBC WITH DIFFERENTIAL/PLATELET
Abs Immature Granulocytes: 0.03 10*3/uL (ref 0.00–0.07)
Basophils Absolute: 0.1 10*3/uL (ref 0.0–0.1)
Basophils Relative: 1 %
Eosinophils Absolute: 0.6 10*3/uL — ABNORMAL HIGH (ref 0.0–0.5)
Eosinophils Relative: 9 %
HCT: 44.2 % (ref 36.0–46.0)
Hemoglobin: 14.1 g/dL (ref 12.0–15.0)
Immature Granulocytes: 0 %
Lymphocytes Relative: 16 %
Lymphs Abs: 1.1 10*3/uL (ref 0.7–4.0)
MCH: 30.8 pg (ref 26.0–34.0)
MCHC: 31.9 g/dL (ref 30.0–36.0)
MCV: 96.5 fL (ref 80.0–100.0)
Monocytes Absolute: 0.9 10*3/uL (ref 0.1–1.0)
Monocytes Relative: 12 %
Neutro Abs: 4.4 10*3/uL (ref 1.7–7.7)
Neutrophils Relative %: 62 %
Platelets: 192 10*3/uL (ref 150–400)
RBC: 4.58 MIL/uL (ref 3.87–5.11)
RDW: 12.3 % (ref 11.5–15.5)
WBC: 7.2 10*3/uL (ref 4.0–10.5)
nRBC: 0 % (ref 0.0–0.2)

## 2020-08-09 LAB — TYPE AND SCREEN
ABO/RH(D): O POS
Antibody Screen: NEGATIVE

## 2020-08-09 LAB — BASIC METABOLIC PANEL
Anion gap: 8 (ref 5–15)
BUN: 17 mg/dL (ref 8–23)
CO2: 28 mmol/L (ref 22–32)
Calcium: 9.2 mg/dL (ref 8.9–10.3)
Chloride: 104 mmol/L (ref 98–111)
Creatinine, Ser: 0.66 mg/dL (ref 0.44–1.00)
GFR, Estimated: >60 mL/min (ref >60)
Glucose, Bld: 94 mg/dL (ref 70–99)
Potassium: 4 mmol/L (ref 3.5–5.1)
Sodium: 140 mmol/L (ref 135–145)

## 2020-08-09 LAB — PROTIME-INR
INR: 1 (ref 0.8–1.2)
Prothrombin Time: 12.9 seconds (ref 11.4–15.2)

## 2020-08-09 LAB — APTT: aPTT: 27 seconds (ref 24–36)

## 2020-08-09 LAB — SARS CORONAVIRUS 2 (TAT 6-24 HRS): SARS Coronavirus 2: NEGATIVE

## 2020-08-09 NOTE — Progress Notes (Signed)
[facility] Perioperative Services: Pre-Admission/Anesthesia Testing   Date: 08/09/20 Name: Sara [name] MRN:   [id number]  Re: Consideration of preoperative prophylactic antibiotic change   Request sent to: Schnier, Dolores Lory, MD (routed and/or faxed via [facility])  Planned Surgical Procedure(s):    Case: [id number] Date/Time: 08/11/20 1432   Procedure: AMPUTATION ABOVE KNEE (Right Knee)   Anesthesia type: General   Pre-op diagnosis: ASO WITH REST PAIN   Location: [place] [facility] [place] [facility]   Surgeons: [name], MD    Notes: 1. Patient has a documented allergy to PCN (Amoxicillin) . Advising that PCN has caused her to experience low severity yeast infection in the past.   2. Received PCN/cephalosporin with no documented complications . CEFUROXIME received on 02/10/2016 . CEPHALEXIN received on 09/01/2016 . CEFAZOLIN received on 05/30/2019 and 02/27/2020  3. Screened as appropriate for cephalosporin use during medication reconciliation . No immediate angioedema, dysphagia, SOB, anaphylaxis symptoms. . No severe rash involving mucous membranes or skin necrosis. . No hospital admissions related to side effects of PCN/cephalosporin use.  . No documented reaction to PCN or cephalosporin in the last 10 years.  Request:  As an evidence based approach to reducing the rate of incidence for post-operative SSI and the development of MDROs, could an agent with narrower coverage for preoperative prophylaxis in this patient's upcoming surgical course be considered?  1. Currently ordered preoperative prophylactic ABX: clindamycin.   2. Specifically requesting change to cephalosporin (CEFAZOLIN).   3. Please communicate decision with me and I will change the orders in Epic as per your direction.   Things to consider:  Many patients report that they were "allergic" to PCN earlier in life, however this does not translate into a true lifelong  allergy. Patients can lose sensitivity to specific IgE antibodies over time if PCN is avoided (Kleris & Lugar, 2019).   Up to 10% of the adult population and 15% of hospitalized patients report an allergy to PCN, however clinical studies suggest that 90% of those reporting an allergy can tolerate PCN antibiotics (Kleris & Lugar, 2019).   Cross-sensitivity between PCN and cephalosporins has been documented as being as high as 10%, however this estimation included data believed to have been collected in a setting where there was contamination. Newer data suggests that the prevalence of cross-sensitivity between PCN and cephalosporins is actually estimated to be closer to 1% (Hermanides et al., 2018).    Patients labeled as PCN allergic, whether they are truly allergic or not, have been found to have inferior outcomes in terms of rates of serious infection, and these patients tend to have longer hospital stays ([place], 2019).   Treatment related secondary infections, such as Clostridioides difficile, have been linked to the improper use of broad spectrum antibiotics in patients improperly labeled as PCN allergic (Kleris & Lugar, 2019).   Anaphylaxis from cephalosporins is rare and the evidence suggests that there is no increased risk of an anaphylactic type reaction when cephalosporins are used in a PCN allergic patient (Pichichero, 2006).  Citations: Hermanides J, Lemkes BA, Prins Pearla Dubonnet MW, Terreehorst I. Presumed ?-Lactam Allergy and Cross-reactivity in the Operating Theater: A Practical Approach. Anesthesiology. 2018 Aug;129(2):335-342. doi: 10.1097/ALN.0000000000002252. PMID: 23762831.  Kleris, [place]., & Lugar, P. L. (2019). Things We Do For No Reason: Failing to Question a Penicillin Allergy History. Journal of hospital medicine, 14(10), [phone]. Advance online publication. https://www.wallace-middleton.info/  Pichichero, M. E. (2006). Cephalosporins can be  prescribed safely for  penicillin-allergic patients. Journal of family medicine, 55(2), 106-112. Accessed: https://cdn.mdedge.com/files/s61fs-public/Document/September-2017/5502JFP_AppliedEvidence1.pdf   Honor Loh, MSN, APRN, FNP-C, CEN [facility]  Peri-operative Services Nurse Practitioner FAX: [phone] 08/09/20 12:43 PM

## 2020-08-09 NOTE — Patient Instructions (Signed)
Your procedure is scheduled on: 08/11/20 Report to [place]. Check in at the Admitting desk first. To find out your arrival time please call [phone] between 1PM - 3PM on 08/10/20.  Remember: Instructions that are not followed completely may result in serious medical risk, up to and including death, or upon the discretion of your surgeon and anesthesiologist your surgery may need to be rescheduled.     _X__ 1. Do not eat food after midnight the night before your procedure.                 No gum chewing or hard candies. You may drink clear liquids up to 2 hours                 before you are scheduled to arrive for your surgery- DO not drink clear                 liquids within 2 hours of the start of your surgery.                 Clear Liquids include:  water, apple juice without pulp, clear carbohydrate                 drink such as Clearfast or Gatorade, Black Coffee or Tea (Do not add                 anything to coffee or tea). Diabetics water only  __X__2.  On the morning of surgery brush your teeth with toothpaste and water, you                 may rinse your mouth with mouthwash if you wish.  Do not swallow any              toothpaste of mouthwash.     _X__ 3.  No Alcohol for 24 hours before or after surgery.   _X__ 4.  Do Not Smoke or use e-cigarettes For 24 Hours Prior to Your Surgery.                 Do not use any chewable tobacco products for at least 6 hours prior to                 surgery.  ____  5.  Bring all medications with you on the day of surgery if instructed.   __X__  6.  Notify your doctor if there is any change in your medical condition      (cold, fever, infections).     Do not wear jewelry, make-up, hairpins, clips or nail polish. Do not wear lotions, powders, or perfumes.  Do not shave 48 hours prior to surgery. Men may shave face and neck. Do not bring valuables to the hospital.    [facility]  is not responsible for any belongings or valuables.  Contacts, dentures/partials or body piercings may not be worn into surgery. Bring a case for your contacts, glasses or hearing aids, a denture cup will be supplied. Leave your suitcase in the car. After surgery it may be brought to your room. For patients admitted to the hospital, discharge time is determined by your treatment team.   Patients discharged the day of surgery will not be allowed to drive home.   Please read over the following fact sheets that you were given:   MRSA Information  __X__ Take these medicines the morning of surgery  with A SIP OF WATER:    1. diltiazem (CARDIZEM CD) 240 MG 24 hr capsule  2. pantoprazole (PROTONIX) 40 MG tablet  3. venlafaxine XR (EFFEXOR-XR) 150 MG 24 hr capsule  4.  5.  6.  ____ Fleet Enema (as directed)   __X__ Use CHG Soap/SAGE wipes as directed  ____ Use inhalers on the day of surgery  ____ Stop metformin/Janumet/Farxiga 2 days prior to surgery    ____ Take 1/2 of usual insulin dose the night before surgery. No insulin the morning          of surgery.   __x__ Stop Blood Thinners Coumadin/Plavix/Xarelto/Pleta/Pradaxa/Eliquis/Effient/Aspirin  on   Or contact your Surgeon, Cardiologist or Medical Doctor regarding  ability to stop your blood thinners  (already stopped)  __X__ Stop Anti-inflammatories 7 days before surgery such as Advil, Ibuprofen, Motrin,  BC or Goodies Powder, Naprosyn, Naproxen, Aleve, Aspirin    __X__ Stop all herbal supplements, fish oil or vitamin E until after surgery.    ____ Bring C-Pap to the hospital.

## 2020-08-11 ENCOUNTER — Encounter: Payer: Self-pay | Admitting: Vascular Surgery

## 2020-08-11 ENCOUNTER — Other Ambulatory Visit: Payer: Self-pay

## 2020-08-11 ENCOUNTER — Other Ambulatory Visit (INDEPENDENT_AMBULATORY_CARE_PROVIDER_SITE_OTHER): Payer: Self-pay | Admitting: Nurse Practitioner

## 2020-08-11 ENCOUNTER — Inpatient Hospital Stay: Payer: Medicare Other | Admitting: Certified Registered Nurse Anesthetist

## 2020-08-11 ENCOUNTER — Inpatient Hospital Stay
Admission: RE | Admit: 2020-08-11 | Discharge: 2020-08-16 | DRG: 241 | Disposition: A | Discharge status: Home Health | Payer: Medicare Other | Attending: Vascular Surgery | Admitting: Vascular Surgery

## 2020-08-11 ENCOUNTER — Encounter
Admission: RE | Disposition: A | Discharge status: Home / Self Care | Payer: Self-pay | Source: Home / Self Care | Attending: Vascular Surgery

## 2020-08-11 DIAGNOSIS — Z7901 Long term (current) use of anticoagulants: Secondary | ICD-10-CM

## 2020-08-11 DIAGNOSIS — E7849 Other hyperlipidemia: Secondary | ICD-10-CM | POA: Diagnosis present

## 2020-08-11 DIAGNOSIS — Z86718 Personal history of other venous thrombosis and embolism: Secondary | ICD-10-CM | POA: Diagnosis not present

## 2020-08-11 DIAGNOSIS — Z20822 Contact with and (suspected) exposure to covid-19: Secondary | ICD-10-CM | POA: Diagnosis present

## 2020-08-11 DIAGNOSIS — Z87891 Personal history of nicotine dependence: Secondary | ICD-10-CM | POA: Diagnosis not present

## 2020-08-11 DIAGNOSIS — Z79899 Other long term (current) drug therapy: Secondary | ICD-10-CM

## 2020-08-11 DIAGNOSIS — I70221 Atherosclerosis of native arteries of extremities with rest pain, right leg: Secondary | ICD-10-CM | POA: Diagnosis present

## 2020-08-11 DIAGNOSIS — K219 Gastro-esophageal reflux disease without esophagitis: Secondary | ICD-10-CM | POA: Diagnosis present

## 2020-08-11 DIAGNOSIS — T82898A Other specified complication of vascular prosthetic devices, implants and grafts, initial encounter: Secondary | ICD-10-CM | POA: Diagnosis present

## 2020-08-11 DIAGNOSIS — G546 Phantom limb syndrome with pain: Secondary | ICD-10-CM | POA: Diagnosis present

## 2020-08-11 DIAGNOSIS — M810 Age-related osteoporosis without current pathological fracture: Secondary | ICD-10-CM | POA: Diagnosis present

## 2020-08-11 DIAGNOSIS — Y832 Surgical operation with anastomosis, bypass or graft as the cause of abnormal reaction of the patient, or of later complication, without mention of misadventure at the time of the procedure: Secondary | ICD-10-CM | POA: Diagnosis present

## 2020-08-11 DIAGNOSIS — Z7982 Long term (current) use of aspirin: Secondary | ICD-10-CM

## 2020-08-11 DIAGNOSIS — I10 Essential (primary) hypertension: Secondary | ICD-10-CM | POA: Diagnosis present

## 2020-08-11 DIAGNOSIS — M62261 Nontraumatic ischemic infarction of muscle, right lower leg: Secondary | ICD-10-CM | POA: Diagnosis present

## 2020-08-11 DIAGNOSIS — M79661 Pain in right lower leg: Secondary | ICD-10-CM | POA: Diagnosis present

## 2020-08-11 HISTORY — PX: AMPUTATION: SHX166

## 2020-08-11 LAB — CBC
HCT: 33.3 % — ABNORMAL LOW (ref 36.0–46.0)
Hemoglobin: 10.8 g/dL — ABNORMAL LOW (ref 12.0–15.0)
MCH: 31.5 pg (ref 26.0–34.0)
MCHC: 32.4 g/dL (ref 30.0–36.0)
MCV: 97.1 fL (ref 80.0–100.0)
Platelets: 149 10*3/uL — ABNORMAL LOW (ref 150–400)
RBC: 3.43 MIL/uL — ABNORMAL LOW (ref 3.87–5.11)
RDW: 12.5 % (ref 11.5–15.5)
WBC: 9.7 10*3/uL (ref 4.0–10.5)
nRBC: 0 % (ref 0.0–0.2)

## 2020-08-11 LAB — CREATININE, SERUM
Creatinine, Ser: 0.71 mg/dL (ref 0.44–1.00)
GFR, Estimated: >60 mL/min (ref >60)

## 2020-08-11 SURGERY — AMPUTATION, ABOVE KNEE
Anesthesia: General | Site: Knee | Laterality: Right

## 2020-08-11 MED ORDER — FENTANYL CITRATE (PF) 100 MCG/2ML IJ SOLN
INTRAMUSCULAR | Status: AC
  Filled 2020-08-11: qty 2

## 2020-08-11 MED ORDER — ACETAMINOPHEN 10 MG/ML IV SOLN
INTRAVENOUS | Status: DC | PRN
  Administered 2020-08-11: 1000 mg via INTRAVENOUS

## 2020-08-11 MED ORDER — ATORVASTATIN CALCIUM 20 MG PO TABS
40.0000 mg | ORAL_TABLET | Freq: Every day | ORAL | Status: DC
  Administered 2020-08-12 – 2020-08-15 (×4): 40 mg via ORAL
  Filled 2020-08-11 (×5): qty 2

## 2020-08-11 MED ORDER — FENTANYL CITRATE (PF) 100 MCG/2ML IJ SOLN
25.0000 ug | INTRAMUSCULAR | Status: DC | PRN
  Administered 2020-08-11: 50 ug via INTRAVENOUS
  Administered 2020-08-11: 25 ug via INTRAVENOUS

## 2020-08-11 MED ORDER — CHLORHEXIDINE GLUCONATE CLOTH 2 % EX PADS
6.0000 | MEDICATED_PAD | Freq: Once | CUTANEOUS | Status: AC
  Administered 2020-08-11: 6 via TOPICAL

## 2020-08-11 MED ORDER — ACETAMINOPHEN 325 MG PO TABS
325.0000 mg | ORAL_TABLET | ORAL | Status: DC | PRN
Start: 2020-08-11 — End: 2020-08-16

## 2020-08-11 MED ORDER — HEPARIN SODIUM (PORCINE) 5000 UNIT/ML IJ SOLN
5000.0000 [IU] | Freq: Three times a day (TID) | INTRAMUSCULAR | Status: DC
  Administered 2020-08-12 (×2): 5000 [IU] via SUBCUTANEOUS
  Filled 2020-08-11 (×2): qty 1

## 2020-08-11 MED ORDER — POTASSIUM CHLORIDE CRYS ER 20 MEQ PO TBCR: 20.0000 meq | EXTENDED_RELEASE_TABLET | Freq: Every day | ORAL | Status: DC | PRN

## 2020-08-11 MED ORDER — BUPIVACAINE LIPOSOME 1.3 % IJ SUSP
INTRAMUSCULAR | Status: DC | PRN
  Administered 2020-08-11: 50 mL

## 2020-08-11 MED ORDER — MAGNESIUM SULFATE 2 GM/50ML IV SOLN: 2.0000 g | Freq: Every day | INTRAVENOUS | Status: DC | PRN

## 2020-08-11 MED ORDER — VITAMIN D3 25 MCG (1000 UNIT) PO TABS
1000.0000 [IU] | ORAL_TABLET | Freq: Every day | ORAL | Status: DC
  Administered 2020-08-12 – 2020-08-16 (×5): 1000 [IU] via ORAL
  Filled 2020-08-11 (×10): qty 1

## 2020-08-11 MED ORDER — LIDOCAINE HCL (CARDIAC) PF 100 MG/5ML IV SOSY
PREFILLED_SYRINGE | INTRAVENOUS | Status: DC | PRN
  Administered 2020-08-11: 80 mg via INTRAVENOUS

## 2020-08-11 MED ORDER — MIDAZOLAM HCL 2 MG/2ML IJ SOLN
INTRAMUSCULAR | Status: DC | PRN
  Administered 2020-08-11: 2 mg via INTRAVENOUS

## 2020-08-11 MED ORDER — DILTIAZEM HCL ER COATED BEADS 240 MG PO CP24
240.0000 mg | ORAL_CAPSULE | Freq: Every day | ORAL | Status: DC
  Administered 2020-08-12 – 2020-08-16 (×5): 240 mg via ORAL
  Filled 2020-08-11 (×5): qty 1

## 2020-08-11 MED ORDER — ALUM & MAG HYDROXIDE-SIMETH 200-200-20 MG/5ML PO SUSP: 15.0000 mL | ORAL | Status: DC | PRN

## 2020-08-11 MED ORDER — SODIUM CHLORIDE 0.9 % IV SOLN: INTRAVENOUS | Status: DC

## 2020-08-11 MED ORDER — CHLORHEXIDINE GLUCONATE 0.12 % MT SOLN
OROMUCOSAL | Status: AC
  Administered 2020-08-11: 15 mL via OROMUCOSAL
  Filled 2020-08-11: qty 15

## 2020-08-11 MED ORDER — CALCIUM CARBONATE 1250 (500 CA) MG PO TABS
1000.0000 mg | ORAL_TABLET | Freq: Two times a day (BID) | ORAL | Status: DC
  Administered 2020-08-12 – 2020-08-16 (×9): 1000 mg via ORAL
  Filled 2020-08-11 (×11): qty 2

## 2020-08-11 MED ORDER — PHENOL 1.4 % MT LIQD
1.0000 | OROMUCOSAL | Status: DC | PRN
  Filled 2020-08-11: qty 177

## 2020-08-11 MED ORDER — ACETAMINOPHEN 650 MG RE SUPP
325.0000 mg | RECTAL | Status: DC | PRN
Start: 2020-08-11 — End: 2020-08-16

## 2020-08-11 MED ORDER — SUCCINYLCHOLINE CHLORIDE 20 MG/ML IJ SOLN
INTRAMUSCULAR | Status: DC | PRN
  Administered 2020-08-11: 100 mg via INTRAVENOUS

## 2020-08-11 MED ORDER — ONDANSETRON HCL 4 MG/2ML IJ SOLN
INTRAMUSCULAR | Status: DC | PRN
  Administered 2020-08-11: 4 mg via INTRAVENOUS

## 2020-08-11 MED ORDER — MIDAZOLAM HCL 2 MG/2ML IJ SOLN
INTRAMUSCULAR | Status: AC
  Filled 2020-08-11: qty 2

## 2020-08-11 MED ORDER — MORPHINE SULFATE (PF) 2 MG/ML IV SOLN
2.0000 mg | INTRAVENOUS | Status: DC | PRN
  Administered 2020-08-11: 3 mg via INTRAVENOUS
  Filled 2020-08-11: qty 2

## 2020-08-11 MED ORDER — ROCURONIUM BROMIDE 100 MG/10ML IV SOLN
INTRAVENOUS | Status: DC | PRN
  Administered 2020-08-11: 40 mg via INTRAVENOUS
  Administered 2020-08-11: 10 mg via INTRAVENOUS

## 2020-08-11 MED ORDER — OXYCODONE-ACETAMINOPHEN 5-325 MG PO TABS
1.0000 | ORAL_TABLET | ORAL | Status: DC | PRN
  Administered 2020-08-11: 1 via ORAL
  Administered 2020-08-12: 2 via ORAL
  Administered 2020-08-12: 1 via ORAL
  Administered 2020-08-12 – 2020-08-14 (×5): 2 via ORAL
  Administered 2020-08-15 – 2020-08-16 (×3): 1 via ORAL
  Filled 2020-08-11: qty 2
  Filled 2020-08-11: qty 1
  Filled 2020-08-11: qty 2
  Filled 2020-08-11: qty 1
  Filled 2020-08-11 (×4): qty 2
  Filled 2020-08-11 (×2): qty 1
  Filled 2020-08-11: qty 2

## 2020-08-11 MED ORDER — PROPOFOL 10 MG/ML IV BOLUS
INTRAVENOUS | Status: AC
  Filled 2020-08-11: qty 20

## 2020-08-11 MED ORDER — BUPIVACAINE HCL (PF) 0.5 % IJ SOLN
INTRAMUSCULAR | Status: AC
  Filled 2020-08-11: qty 30

## 2020-08-11 MED ORDER — DOCUSATE SODIUM 100 MG PO CAPS
100.0000 mg | ORAL_CAPSULE | Freq: Every day | ORAL | Status: DC
  Administered 2020-08-12 – 2020-08-15 (×4): 100 mg via ORAL
  Filled 2020-08-11 (×5): qty 1

## 2020-08-11 MED ORDER — ORAL CARE MOUTH RINSE: 15.0000 mL | Freq: Once | OROMUCOSAL | Status: AC

## 2020-08-11 MED ORDER — FAMOTIDINE IN NACL 20-0.9 MG/50ML-% IV SOLN
20.0000 mg | Freq: Two times a day (BID) | INTRAVENOUS | Status: DC
  Administered 2020-08-11 – 2020-08-12 (×2): 20 mg via INTRAVENOUS
  Filled 2020-08-11 (×3): qty 50

## 2020-08-11 MED ORDER — GUAIFENESIN-DM 100-10 MG/5ML PO SYRP: 15.0000 mL | ORAL_SOLUTION | ORAL | Status: DC | PRN

## 2020-08-11 MED ORDER — SORBITOL 70 % SOLN
30.0000 mL | Freq: Every day | Status: DC | PRN
  Administered 2020-08-12: 30 mL via ORAL
  Filled 2020-08-11 (×3): qty 30

## 2020-08-11 MED ORDER — BUPIVACAINE LIPOSOME 1.3 % IJ SUSP
INTRAMUSCULAR | Status: AC
  Filled 2020-08-11: qty 20

## 2020-08-11 MED ORDER — DEXAMETHASONE SODIUM PHOSPHATE 10 MG/ML IJ SOLN
INTRAMUSCULAR | Status: DC | PRN
  Administered 2020-08-11: 10 mg via INTRAVENOUS

## 2020-08-11 MED ORDER — SUGAMMADEX SODIUM 200 MG/2ML IV SOLN
INTRAVENOUS | Status: DC | PRN
  Administered 2020-08-11: 114.4 mg via INTRAVENOUS

## 2020-08-11 MED ORDER — CLINDAMYCIN PHOSPHATE 300 MG/50ML IV SOLN
INTRAVENOUS | Status: AC
  Filled 2020-08-11: qty 50

## 2020-08-11 MED ORDER — POLYETHYLENE GLYCOL 3350 17 G PO PACK
17.0000 g | PACK | Freq: Every day | ORAL | Status: DC | PRN
  Administered 2020-08-13: 17 g via ORAL
  Filled 2020-08-11: qty 1

## 2020-08-11 MED ORDER — MEPERIDINE HCL 50 MG/ML IJ SOLN: 6.2500 mg | INTRAMUSCULAR | Status: DC | PRN

## 2020-08-11 MED ORDER — ONDANSETRON HCL 4 MG/2ML IJ SOLN: 4.0000 mg | Freq: Once | INTRAMUSCULAR | Status: DC | PRN

## 2020-08-11 MED ORDER — FENTANYL CITRATE (PF) 100 MCG/2ML IJ SOLN
INTRAMUSCULAR | Status: AC
  Administered 2020-08-11: 50 ug via INTRAVENOUS
  Filled 2020-08-11: qty 2

## 2020-08-11 MED ORDER — CHLORHEXIDINE GLUCONATE 0.12 % MT SOLN: 15.0000 mL | Freq: Once | OROMUCOSAL | Status: AC

## 2020-08-11 MED ORDER — PANTOPRAZOLE SODIUM 40 MG PO TBEC
40.0000 mg | DELAYED_RELEASE_TABLET | Freq: Every day | ORAL | Status: DC
Start: 2020-08-12 — End: 2020-08-16
  Administered 2020-08-12 – 2020-08-16 (×5): 40 mg via ORAL
  Filled 2020-08-11 (×5): qty 1

## 2020-08-11 MED ORDER — PHENYLEPHRINE HCL (PRESSORS) 10 MG/ML IV SOLN
INTRAVENOUS | Status: DC | PRN
  Administered 2020-08-11 (×4): 100 ug via INTRAVENOUS
  Administered 2020-08-11: 200 ug via INTRAVENOUS
  Administered 2020-08-11 (×2): 100 ug via INTRAVENOUS

## 2020-08-11 MED ORDER — CEFAZOLIN SODIUM-DEXTROSE 2-4 GM/100ML-% IV SOLN
2.0000 g | Freq: Three times a day (TID) | INTRAVENOUS | Status: AC
  Administered 2020-08-11 – 2020-08-12 (×2): 2 g via INTRAVENOUS
  Filled 2020-08-11 (×3): qty 100

## 2020-08-11 MED ORDER — ZOLPIDEM TARTRATE 5 MG PO TABS
5.0000 mg | ORAL_TABLET | Freq: Every evening | ORAL | Status: DC | PRN
  Administered 2020-08-12 – 2020-08-15 (×3): 5 mg via ORAL
  Filled 2020-08-11 (×3): qty 1

## 2020-08-11 MED ORDER — ACETAMINOPHEN 10 MG/ML IV SOLN
INTRAVENOUS | Status: AC
  Filled 2020-08-11: qty 100

## 2020-08-11 MED ORDER — PROPOFOL 10 MG/ML IV BOLUS
INTRAVENOUS | Status: DC | PRN
  Administered 2020-08-11: 100 mg via INTRAVENOUS

## 2020-08-11 MED ORDER — LACTATED RINGERS IV SOLN: INTRAVENOUS | Status: DC

## 2020-08-11 MED ORDER — EPHEDRINE SULFATE 50 MG/ML IJ SOLN
INTRAMUSCULAR | Status: DC | PRN
  Administered 2020-08-11: 10 mg via INTRAVENOUS

## 2020-08-11 MED ORDER — FENTANYL CITRATE (PF) 100 MCG/2ML IJ SOLN
INTRAMUSCULAR | Status: AC
  Administered 2020-08-11: 25 ug via INTRAVENOUS
  Filled 2020-08-11: qty 2

## 2020-08-11 MED ORDER — FENTANYL CITRATE (PF) 100 MCG/2ML IJ SOLN
INTRAMUSCULAR | Status: DC | PRN
  Administered 2020-08-11 (×4): 50 ug via INTRAVENOUS

## 2020-08-11 MED ORDER — CLOBETASOL PROPIONATE 0.05 % EX CREA
1.0000 "application " | TOPICAL_CREAM | Freq: Every day | CUTANEOUS | Status: DC | PRN
  Filled 2020-08-11: qty 15

## 2020-08-11 MED ORDER — IBANDRONATE SODIUM 150 MG PO TABS: 150.0000 mg | ORAL_TABLET | ORAL | Status: DC

## 2020-08-11 MED ORDER — CLINDAMYCIN PHOSPHATE 300 MG/50ML IV SOLN
300.0000 mg | INTRAVENOUS | Status: AC
  Administered 2020-08-11: 300 mg via INTRAVENOUS

## 2020-08-11 MED ORDER — ONDANSETRON HCL 4 MG/2ML IJ SOLN: 4.0000 mg | Freq: Four times a day (QID) | INTRAMUSCULAR | Status: DC | PRN

## 2020-08-11 MED ORDER — LOSARTAN POTASSIUM 25 MG PO TABS
25.0000 mg | ORAL_TABLET | ORAL | Status: DC
  Administered 2020-08-12 – 2020-08-16 (×4): 25 mg via ORAL
  Filled 2020-08-11 (×5): qty 1

## 2020-08-11 MED ORDER — VENLAFAXINE HCL ER 150 MG PO CP24
150.0000 mg | ORAL_CAPSULE | Freq: Every day | ORAL | Status: DC
  Administered 2020-08-12 – 2020-08-16 (×5): 150 mg via ORAL
  Filled 2020-08-11 (×6): qty 1

## 2020-08-11 SURGICAL SUPPLY — 49 items
BAG COUNTER SPONGE EZ (MISCELLANEOUS) IMPLANT
BLADE SAW SAG 25.4X90 (BLADE) ×2 IMPLANT
BLADE SURG SZ10 CARB STEEL (BLADE) ×2 IMPLANT
BNDG COHESIVE 4X5 TAN STRL (GAUZE/BANDAGES/DRESSINGS) IMPLANT
BNDG ELASTIC 6X5.8 VLCR NS LF (GAUZE/BANDAGES/DRESSINGS) ×2 IMPLANT
BNDG GAUZE 4.5X4.1 6PLY STRL (MISCELLANEOUS) ×2 IMPLANT
CANISTER SUCT 1200ML W/VALVE (MISCELLANEOUS) ×2 IMPLANT
CANISTER WOUND CARE 500ML ATS (WOUND CARE) IMPLANT
CHLORAPREP W/TINT 26 (MISCELLANEOUS) ×4 IMPLANT
COVER WAND RF STERILE (DRAPES) ×2 IMPLANT
DERMABOND ADVANCED (GAUZE/BANDAGES/DRESSINGS)
DERMABOND ADVANCED .7 DNX12 (GAUZE/BANDAGES/DRESSINGS) IMPLANT
DRAPE INCISE IOBAN 66X45 STRL (DRAPES) ×2 IMPLANT
DRSG GAUZE FLUFF 36X18 (GAUZE/BANDAGES/DRESSINGS) ×2 IMPLANT
DRSG VAC ATS MED SENSATRAC (GAUZE/BANDAGES/DRESSINGS) IMPLANT
ELECT CAUTERY BLADE 6.4 (BLADE) ×2 IMPLANT
ELECT REM PT RETURN 9FT ADLT (ELECTROSURGICAL) ×2
ELECTRODE REM PT RTRN 9FT ADLT (ELECTROSURGICAL) ×1 IMPLANT
GLOVE BIO SURGEON STRL SZ7 (GLOVE) ×2 IMPLANT
GLOVE INDICATOR 7.5 STRL GRN (GLOVE) ×2 IMPLANT
GLOVE SURG SYN 8.0 (GLOVE) ×2 IMPLANT
GOWN STRL REUS W/ TWL LRG LVL3 (GOWN DISPOSABLE) ×2 IMPLANT
GOWN STRL REUS W/ TWL XL LVL3 (GOWN DISPOSABLE) ×1 IMPLANT
GOWN STRL REUS W/TWL LRG LVL3 (GOWN DISPOSABLE) ×2
GOWN STRL REUS W/TWL XL LVL3 (GOWN DISPOSABLE) ×1
HANDLE YANKAUER SUCT BULB TIP (MISCELLANEOUS) IMPLANT
KIT TURNOVER KIT A (KITS) ×2 IMPLANT
MANIFOLD NEPTUNE II (INSTRUMENTS) ×2 IMPLANT
NEEDLE HYPO 21X1.5 SAFETY (NEEDLE) ×2 IMPLANT
NS IRRIG 500ML POUR BTL (IV SOLUTION) ×2 IMPLANT
PACK EXTREMITY ARMC (MISCELLANEOUS) ×2 IMPLANT
PAD PREP 24X41 OB/GYN DISP (PERSONAL CARE ITEMS) ×2 IMPLANT
SPONGE LAP 18X18 RF (DISPOSABLE) ×4 IMPLANT
STAPLER SKIN PROX 35W (STAPLE) IMPLANT
STOCKINETTE M/LG 89821 (MISCELLANEOUS) ×2 IMPLANT
SUT ETHIBOND 0 36 GRN (SUTURE) IMPLANT
SUT MNCRL 4-0 (SUTURE) ×1
SUT MNCRL 4-0 27XMFL (SUTURE) ×1
SUT SILK 2 0 (SUTURE)
SUT SILK 2-0 18XBRD TIE 12 (SUTURE) IMPLANT
SUT SILK 3 0 (SUTURE) ×1
SUT SILK 3-0 18XBRD TIE 12 (SUTURE) ×1 IMPLANT
SUT VIC AB 0 CT1 36 (SUTURE) ×12 IMPLANT
SUT VIC AB 3-0 SH 27 (SUTURE) ×2
SUT VIC AB 3-0 SH 27X BRD (SUTURE) ×2 IMPLANT
SUT VICRYL PLUS ABS 0 54 (SUTURE) ×2 IMPLANT
SUTURE MNCRL 4-0 27XMF (SUTURE) ×1 IMPLANT
SYR 20ML LL LF (SYRINGE) ×4 IMPLANT
TAPE UMBIL 1/8X18 RADIOPA (MISCELLANEOUS) ×2 IMPLANT

## 2020-08-11 NOTE — Interval H&P Note (Signed)
History and Physical Interval Note:  08/11/2020 4:32 PM  Sara [name]  has presented today for surgery, with the diagnosis of ASO WITH REST PAIN.  The various methods of treatment have been discussed with the patient and family. After consideration of risks, benefits and other options for treatment, the patient has consented to  Procedure(s): AMPUTATION ABOVE KNEE (Right) as a surgical intervention.  The patient's history has been reviewed, patient examined, no change in status, stable for surgery.  I have reviewed the patient's chart and labs.  Questions were answered to the patient's satisfaction.     [name]

## 2020-08-11 NOTE — Anesthesia Postprocedure Evaluation (Signed)
Anesthesia Post Note  Patient: Sara [name]  Procedure(s) Performed: AMPUTATION ABOVE KNEE (Right Knee)  Patient location during evaluation: PACU Anesthesia Type: General Level of consciousness: awake and alert Pain management: pain level controlled Vital Signs Assessment: post-procedure vital signs reviewed and stable Respiratory status: spontaneous breathing, nonlabored ventilation, respiratory function stable and patient connected to nasal cannula oxygen Cardiovascular status: blood pressure returned to baseline and stable Postop Assessment: no apparent nausea or vomiting Anesthetic complications: no   No complications documented.   Last Vitals:  Vitals:   08/11/20 1937 08/11/20 2007  BP: 119/63 (!) 104/57  Pulse: 96 97  Resp: 16 17  Temp: 36.7 C 36.8 C  SpO2: 95% 96%    Last Pain:  Vitals:   08/11/20 2004  TempSrc:   PainSc: [place]

## 2020-08-11 NOTE — Op Note (Signed)
  [facility]   OPERATIVE NOTE   PROCEDURE:  Right above-the-knee amputation  PRE-OPERATIVE DIAGNOSIS: Right foot ischemic rest pain  POST-OPERATIVE DIAGNOSIS: same as above  SURGEON: [name], MD  ASSISTANT(S): None  ANESTHESIA: general  ESTIMATED BLOOD LOSS: 200 cc  FINDING(S): Viable muscle bellies of the thigh  SPECIMEN(S):  Right above-the-knee amputation  INDICATIONS:   [name] is a 69 y.o. female who presents with right ischemic rest pain.  The patient is scheduled for a right above-the-knee amputation.  I discussed in depth with the patient the risks, benefits, and alternatives to this procedure.  The patient is aware that the risk of this operation included but are not limited to:  bleeding, infection, myocardial infarction, stroke, death, failure to heal amputation wound, and possible need for more proximal amputation.  The patient is aware of the risks and agrees proceed forward with the procedure.  DESCRIPTION: After full informed written consent was obtained from the patient, the patient was taken to the operating room, and placed supine upon the operating table.  Prior to induction, the patient received IV antibiotics.  The patient was then prepped and draped in the standard fashion for a right above-the-knee amputation.  After obtaining adequate anesthesia, the patient was prepped and draped in the standard fashion for a above-the-knee amputation.  I marked out the anterior and posterior flaps for a fish-mouth type of amputation.  I made the incisions for these flaps, and then dissected through the subcutaneous tissue, fascia, and muscles circumferentially.  I elevated  the periosteal tissue 4-5 cm more proximal than the anterior skin flap.  I then transected the femur with a power saw at this level.  Then I smoothed out the rough edges of the bone.  At this point, the specimen was passed off the field as the above-the-knee  amputation.  At this point, I clamped all visibly bleeding arteries and veins using a combination of suture ligation with silk suture and electrocautery.   Bleeding continued to be controlled with electrocautery and suture ligature.  The stump was washed off with sterile normal saline and no further active bleeding was noted.  I reapproximated the anterior and posterior fascia  with interrupted stitches of 0 Vicryl.  This was completed along the entire length of anterior and posterior fascia until there were no more loose space in the fascial line. The subcutaneous tissue was then approximated with 2-0 vicryl sutures. The skin was then  reapproximated with 4-0 Monocryl subcuticular and Dermabond.  The stump was washed off and dried.  The incision was dressed with ABD pads, and  then fluffs were applied.  Kerlix was wrapped around the leg and then gently an ACE wrap was applied.  A large Ioban was then placed over the ACE wrap to secure the dressing. The patient was then awakened and take to the recovery room in stable condition.   COMPLICATIONS: none  CONDITION: stable  [name]  08/11/2020, 7:10 PM   This note was created with Dragon Medical transcription system. Any errors in dictation are purely unintentional.

## 2020-08-11 NOTE — Transfer of Care (Signed)
Immediate Anesthesia Transfer of Care Note  Patient: Sara [name]  Procedure(s) Performed: AMPUTATION ABOVE KNEE (Right Knee)  Patient Location: PACU  Anesthesia Type:General  Level of Consciousness: awake, alert  and sedated  Airway & Oxygen Therapy: Patient Spontanous Breathing and Patient connected to face mask oxygen  Post-op Assessment: Report given to RN and Post -op Vital signs reviewed and stable  Post vital signs: Reviewed and stable  Last Vitals:  Vitals Value Taken Time  BP    Temp    Pulse 101 08/11/20 1852  Resp 17 08/11/20 1852  SpO2 99 % 08/11/20 1852  Vitals shown include unvalidated device data.  Last Pain:  Vitals:   08/11/20 1257  TempSrc: Tympanic  PainSc: 6          Complications: No complications documented.

## 2020-08-11 NOTE — Progress Notes (Signed)
I spoke with Sara [name] contact [name] by phone to give her an update on the surgery and Ms. Sather's condition.  I answered all her questions and she will plan to visit tomorrow.

## 2020-08-11 NOTE — Anesthesia Preprocedure Evaluation (Addendum)
Anesthesia Evaluation  Patient identified by MRN, date of birth, ID band Patient awake    Reviewed: Allergy & Precautions, H&P , NPO status , Patient's Chart, lab work & pertinent test results  History of Anesthesia Complications Negative for: history of anesthetic complications  Airway Mallampati: III  TM Distance: >3 FB Neck ROM: full    Dental  (+) Upper Dentures, Dental Advisory Given, Chipped   Pulmonary neg shortness of breath, neg COPD, neg recent URI, Not current smoker, former smoker,    breath sounds clear to auscultation       Cardiovascular hypertension, (-) angina+ CAD and + Peripheral Vascular Disease  (-) Past MI and (-) Cardiac Stents (-) dysrhythmias  Rhythm:Regular Rate:Normal - Systolic murmurs 0932: 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, healing well   Neuro/Psych PSYCHIATRIC DISORDERS Depression  Neuromuscular disease    GI/Hepatic Neg liver ROS, hiatal hernia, PUD, GERD  Controlled,  Endo/Other  negative endocrine ROS  Renal/GU negative Renal ROS  negative genitourinary   Musculoskeletal  (+) Arthritis ,   Abdominal   Peds  Hematology negative hematology ROS (+)   Anesthesia Other Findings Past Medical History: No date: Arthritis     Comment:  right shoulder No date: Depression No date: GERD (gastroesophageal reflux disease) No date: History of blood clots No date: Hyperlipidemia No date: Hypertension No date: Mild mitral regurgitation No date: Osteoporosis No date: Stomach ulcer No date: Vascular disease     Comment:  Sees Dr. Delana Meyer No date: Vertigo     Comment:  Last episode approx Aug 2015 No date: Wears dentures     Comment:  full upper  Past Surgical History: No date: ADENOIDECTOMY No date: BREAST CYST ASPIRATION; Left 02/15/2017: CARDIAC CATHETERIZATION     Comment:  [facility] 10/22/2017: COLONOSCOPY WITH PROPOFOL; N/A     Comment:   Procedure: COLONOSCOPY WITH PROPOFOL;  Surgeon: Lucilla Lame, MD;  Location: [place];  Service:               Endoscopy;  Laterality: N/A;  specimens not taken--pt on               Plavix will be brought back in after 7 days off med 11/05/2017: COLONOSCOPY WITH PROPOFOL; N/A     Comment:  Procedure: COLONOSCOPY WITH PROPOFOL;  Surgeon: Lucilla Lame, MD;  Location: [place];  Service:               Endoscopy;  Laterality: N/A; 12/21/2014: ESOPHAGOGASTRODUODENOSCOPY; N/A     Comment:  Procedure: ESOPHAGOGASTRODUODENOSCOPY (EGD);  Surgeon:               Lucilla Lame, MD;  Location: [place];                Service: Gastroenterology;  Laterality: N/A; 02/08/2016: ESOPHAGOGASTRODUODENOSCOPY (EGD) WITH PROPOFOL; N/A     Comment:  Procedure: ESOPHAGOGASTRODUODENOSCOPY (EGD) WITH               PROPOFOL;  Surgeon: Lucilla Lame, MD;  Location: [facility]               ENDOSCOPY;  Service: Endoscopy;  Laterality: N/A; 05/30/2019: FASCIOTOMY; Right     Comment:  Procedure: FASCIOTOMY;  Surgeon: [name], MD;  Location: [facility]S;  Service: Vascular;  Laterality:               Right; 06/04/2019: FASCIOTOMY CLOSURE; Right     Comment:  Procedure: FASCIOTOMY CLOSURE;  Surgeon: Renford Dills, MD;  Location: [facility];  Service: Vascular;                Laterality: Right; 2014: HEMORROIDECTOMY 05/30/2019: LOWER EXTREMITY ANGIOGRAPHY; Right     Comment:  Procedure: LOWER EXTREMITY ANGIOGRAPHY;  Surgeon:               Renford Dills, MD;  Location: [facility];               Service: Cardiovascular;  Laterality: Right; 02/09/2016: PERIPHERAL VASCULAR CATHETERIZATION; N/A     Comment:  Procedure: Abdominal Aortogram w/Lower Extremity;                Surgeon: Renford Dills, MD;  Location: [facility] INV[facility]               CV LAB;  Service: Cardiovascular;  Laterality: N/A; 02/10/2016: PERIPHERAL VASCULAR  CATHETERIZATION; Right     Comment:  Procedure: Lower Extremity Angiography;  Surgeon:               Renford Dills, MD;  Location: [facility];                Service: Cardiovascular;  Laterality: Right; 11/05/2017: POLYPECTOMY     Comment:  Procedure: POLYPECTOMY;  Surgeon: Midge Minium, MD;                Location: [facility];  Service: Endoscopy;; No date: TONSILLECTOMY 4818,5631: VASCULAR SURGERY     Comment:  Fem-Pop Bypass     Reproductive/Obstetrics negative OB ROS                            Anesthesia Physical  Anesthesia Plan  ASA: III  Anesthesia Plan: General   Post-op Pain Management:    Induction: Intravenous  PONV Risk Score and Plan: 4 or greater and Midazolam, Dexamethasone and Ondansetron  Airway Management Planned: Oral ETT  Additional Equipment: None  Intra-op Plan:   Post-operative Plan: Extubation in OR  Informed Consent: I have reviewed the patients History and Physical, chart, labs and discussed the procedure including the risks, benefits and alternatives for the proposed anesthesia with the patient or authorized representative who has indicated his/her understanding and acceptance.     Dental Advisory Given  Plan Discussed with: Anesthesiologist  Anesthesia Plan Comments: (Patient consented for risks of anesthesia including but not limited to:  - adverse reactions to medications - damage to eyes, teeth, lips or other oral mucosa - nerve damage due to positioning  - sore throat or hoarseness - Damage to heart, brain, nerves, lungs, other parts of body or loss of life  Patient voiced understanding.)       Anesthesia Quick Evaluation

## 2020-08-11 NOTE — Anesthesia Procedure Notes (Signed)
Procedure Name: Intubation Date/Time: 08/11/2020 5:18 PM Performed by: Nelda Marseille, CRNA Pre-anesthesia Checklist: Patient identified, Patient being monitored, Timeout performed, Emergency Drugs available and Suction available Patient Re-evaluated:Patient Re-evaluated prior to induction Oxygen Delivery Method: Circle system utilized Preoxygenation: Pre-oxygenation with 100% oxygen Induction Type: IV induction Ventilation: Mask ventilation without difficulty Laryngoscope Size: Mac, 3 and McGraph Grade View: Grade I Tube type: Oral Tube size: 7.0 mm Number of attempts: 1 Airway Equipment and Method: Stylet Placement Confirmation: ETT inserted through vocal cords under direct vision,  positive ETCO2 and breath sounds checked- equal and bilateral Secured at: 21 cm Tube secured with: Tape Dental Injury: Teeth and Oropharynx as per pre-operative assessment

## 2020-08-12 ENCOUNTER — Encounter: Payer: Self-pay | Admitting: Vascular Surgery

## 2020-08-12 LAB — CBC
HCT: 31.9 % — ABNORMAL LOW (ref 36.0–46.0)
Hemoglobin: 10.7 g/dL — ABNORMAL LOW (ref 12.0–15.0)
MCH: 32.3 pg (ref 26.0–34.0)
MCHC: 33.5 g/dL (ref 30.0–36.0)
MCV: 96.4 fL (ref 80.0–100.0)
Platelets: 164 10*3/uL (ref 150–400)
RBC: 3.31 MIL/uL — ABNORMAL LOW (ref 3.87–5.11)
RDW: 12.5 % (ref 11.5–15.5)
WBC: 10.1 10*3/uL (ref 4.0–10.5)
nRBC: 0 % (ref 0.0–0.2)

## 2020-08-12 LAB — BASIC METABOLIC PANEL
Anion gap: 7 (ref 5–15)
BUN: 10 mg/dL (ref 8–23)
CO2: 23 mmol/L (ref 22–32)
Calcium: 8 mg/dL — ABNORMAL LOW (ref 8.9–10.3)
Chloride: 108 mmol/L (ref 98–111)
Creatinine, Ser: 0.71 mg/dL (ref 0.44–1.00)
GFR, Estimated: >60 mL/min (ref >60)
Glucose, Bld: 207 mg/dL — ABNORMAL HIGH (ref 70–99)
Potassium: 4 mmol/L (ref 3.5–5.1)
Sodium: 138 mmol/L (ref 135–145)

## 2020-08-12 MED ORDER — ENOXAPARIN SODIUM 40 MG/0.4ML ~~LOC~~ SOLN
40.0000 mg | SUBCUTANEOUS | Status: DC
  Administered 2020-08-12 – 2020-08-15 (×4): 40 mg via SUBCUTANEOUS
  Filled 2020-08-12 (×4): qty 0.4

## 2020-08-12 NOTE — Progress Notes (Signed)
[facility]  Daily Progress Note   Subjective  - 1 Day Post-Op  States having some phantom pain but post op pain well controlled   Objective Vitals:   08/12/20 0247 08/12/20 0833 08/12/20 1321 08/12/20 1613  BP: (!) 98/59 (!) 105/54 124/61 110/62  Pulse: 97 90 89 88  Resp: 16 16 16    Temp: 98.1 F (36.7 C) 98.9 F (37.2 C) 98.7 F (37.1 C) 98.6 F (37 C)  TempSrc: Oral Oral Oral   SpO2: 95% 96% 99% 98%  Weight:      Height:        Intake/Output Summary (Last 24 hours) at 08/12/2020 1641 Last data filed at 08/12/2020 0600 Gross per 24 hour  Intake 1850 ml  Output 800 ml  Net 1050 ml    PULM  Normal effort , no use of accessory muscles CV  No JVD, RRR Abd      No distended, nontender VASC  Dressing CD&I  Laboratory CBC    Component Value Date/Time   WBC 10.1 08/12/2020 0531   HGB 10.7 (L) 08/12/2020 0531   HGB 14.4 09/21/2015 0916   HCT 31.9 (L) 08/12/2020 0531   HCT 44.4 09/21/2015 0916   PLT 164 08/12/2020 0531   PLT 222 09/21/2015 0916    BMET    Component Value Date/Time   NA 138 08/12/2020 0531   NA 139 09/21/2015 0916   K 4.0 08/12/2020 0531   CL 108 08/12/2020 0531   CO2 23 08/12/2020 0531   GLUCOSE 207 (H) 08/12/2020 0531   BUN 10 08/12/2020 0531   BUN 12 09/21/2015 0916   CREATININE 0.71 08/12/2020 0531   CREATININE 0.88 05/16/2016 1034   CALCIUM 8.0 (L) 08/12/2020 0531   GFRNONAA >60 08/12/2020 0531   GFRNONAA 66 05/01/2016 0846   GFRAA >60 02/18/2020 0949   GFRAA 76 05/01/2016 0846    Assessment/Planning: POD #1 s/p right AKA   Patient doing very well, saline lock IV continue PT/OT    [name]  08/12/2020, 4:41 PM

## 2020-08-12 NOTE — Evaluation (Signed)
Occupational Therapy Evaluation Patient Details Name: Sara [name] MRN: [id number] DOB: 1951/02/27 Today's Date: 08/12/2020    History of Present Illness Per MD notes: TAHJANAE [name] is a 69 y.o. 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 PMH includes: arthritis, GERD, HLD, HTN, vertigo, vascular disease, and hx of blood clots.   Clinical Impression   Sara [name] was seen for OT evaluation this date, POD#1 from above surgery. Pt was active and independent in all ADLs prior to surgery. She reports driving, ambulating within the community without an assistive device, and walking her two dogs regularly. Pt is eager to return to PLOF with less pain and improved safety and independence. Pt currently functionally limited by impaired balance, decreased safety awareness, pain, and decreased coordination which limit her ability to perform self-care tasks. She requires MOD A for LB bathing and dressing while in seated position, CGA during functional mobility using a rolling walker during session, and set-up/supervision assist for UB ADL management in a seated position. Pt voices strong desire to obtain a prosthetic limb, "so I can walk without needing anything to help me". Pt instructed in bed mobility, desensitization strategies for management of residual limb, falls prevention strategies, home/routines modifications, & DME/AE for LB bathing and dressing tasks. Pt would benefit from skilled OT services including additional instruction in dressing techniques with or without assistive devices for dressing and bathing skills to support recall and carryover prior to discharge and ultimately to maximize safety, independence, and minimize falls risk and caregiver burden. Upon hospital discharge, recommend acute inpatient rehabilitation services to maximize pt safety and return to PLOF.     Follow Up Recommendations  CIR    Equipment Recommendations  3 in 1 bedside commode    Recommendations for Other  Services Rehab consult     Precautions / Restrictions Precautions Precautions: Fall Precaution Comments: Moderate Fall Restrictions Weight Bearing Restrictions: Yes RLE Weight Bearing: Non weight bearing Other Position/Activity Restrictions: s/p R AKA      Mobility Bed Mobility               General bed mobility comments: Deferred. Pt up on BSC or in recliner at start/end of session    Transfers Overall transfer level: Needs assistance Equipment used: Rolling walker (2 wheeled) Transfers: Sit to/from [employer] Sit to Stand: Min guard Stand pivot transfers: Min guard       General transfer comment: Moderate multimodal cueing for safety and sequencing t/o functional mobility.    Balance Overall balance assessment: Needs assistance Sitting-balance support: Feet supported;Single extremity supported (LLE supported) Sitting balance-Leahy Scale: Fair Sitting balance - Comments: Min posterior LOB appreciated during stand>sit t/f. Postural control: Posterior lean Standing balance support: During functional activity;Bilateral upper extremity supported Standing balance-Leahy Scale: Poor Standing balance comment: Heavy reliance on BUE support for balance in standing.                           ADL either performed or assessed with clinical judgement   ADL Overall ADL's : Needs assistance/impaired Eating/Feeding: Supervision/ safety;Set up;Sitting   Grooming: Set up;Supervision/safety;Sitting   Upper Body Bathing: Minimal assistance;Cueing for safety;Sitting   Lower Body Bathing: Moderate assistance;Sitting/lateral leans;Cueing for safety;With adaptive equipment   Upper Body Dressing : Sitting;Supervision/safety;Set up   Lower Body Dressing: Moderate assistance;Cueing for safety;Sit to/from stand Lower Body Dressing Details (indicate cue type and reason): c AE. Toilet Transfer: RW;BSC;Stand-pivot;Min guard   Engineer, agricultural and  Hygiene: Minimal assistance;Sit to/from stand;Cueing for safety;With adaptive equipment   Tub/ Shower Transfer: Minimal assistance;Tub bench;Ambulation   Functional mobility during ADLs: Min guard;Minimal assistance;Rolling walker;Cueing for safety;Cueing for sequencing General ADL Comments: Pt motiviated to return to baseline level of functional independence with use of a prosthetic limb so she can "walk without needing anything to help me".     Vision Baseline Vision/History: Wears glasses Patient Visual Report: No change from baseline       Perception     Praxis      Pertinent Vitals/Pain Pain Assessment: Faces Faces Pain Scale: Hurts a little bit Pain Location: Phantom limb pain in RLE. Pain Descriptors / Indicators: Discomfort;Sharp Pain Intervention(s): Monitored during session;Limited activity within patient's tolerance;Repositioned;Utilized relaxation techniques;Premedicated before session     Hand Dominance Right   Extremity/Trunk Assessment Upper Extremity Assessment Upper Extremity Assessment: Overall WFL for tasks assessed   Lower Extremity Assessment Lower Extremity Assessment: RLE deficits/detail;Defer to PT evaluation RLE Deficits / Details: s/p R AKA - residual limb maintains hip flexion at rest and in standing. Pt encouraged to actively extend hip to maintain full AROM   Cervical / Trunk Assessment Cervical / Trunk Assessment: Normal   Communication Communication Communication: No difficulties   Cognition Arousal/Alertness: Awake/alert Behavior During Therapy: WFL for tasks assessed/performed;Impulsive Overall Cognitive Status: Within Functional Limits for tasks assessed                                 General Comments: Pt A&Ox4, pleasant, motivated to be as active as possible during session. Mildly impulsive with mobility, requires VCs for safety/AE use.   General Comments  RLE bandages clean/intact at start/end of  session.    Exercises Other Exercises Other Exercises: Pt educated on role of OT in acute vs AIR setting, extensive time to discuss falls prevention strategies for home and hospital including safe use of AE/DME for ADL management, residual limb management techniques including minimizing hip flexion, gentle massage, and gentle tapping for desensitization.   Shoulder Instructions      Home Living Family/patient expects to be discharged to:: Private residence Living Arrangements: Alone Available Help at Discharge: Friend(s);Available 24 hours/day (Pt states friend who is an LPN plans to stay with her once she is able to go home.) Type of Home: Apartment Home Access: Level entry     Home Layout: One level     Bathroom Shower/Tub: Teacher, early years/pre: Standard     Home Equipment: Shower seat          Prior Functioning/Environment Level of Independence: Independent        Comments: Pt is independent with all ADL/IADL management at baseline. She endorses driving, community ambulation without a device, and caring for her two dogs. No falls history in past year.        OT Problem List: Decreased coordination;Pain;Decreased safety awareness;Impaired balance (sitting and/or standing);Decreased knowledge of use of DME or AE      OT Treatment/Interventions: Self-care/ADL training;Therapeutic exercise;Therapeutic activities;DME and/or AE instruction;Patient/family education;Neuromuscular education;Balance training    OT Goals(Current goals can be found in the care plan section) Acute Rehab OT Goals Patient Stated Goal: "to not let this surgery slow me down" OT Goal Formulation: With patient Time For Goal Achievement: 08/26/20 Potential to Achieve Goals: Good ADL Goals Pt Will Perform Grooming: standing;with supervision;with set-up (c LRAD PRN for improved safety and functional indep.) Pt Will Transfer to Toilet: bedside  commode;ambulating;regular height toilet;with  modified independence (c LRAD PRN for improved safety and functional indep.) Pt Will Perform Toileting - Clothing Manipulation and hygiene: sit to/from stand;with adaptive equipment;with modified independence (c LRAD PRN for improved safety and functional indep.) Additional ADL Goal #1: Pt will independently return demonstrate understanding of 3 learned residual limb management techniques in preparation for prosthetic upon hospital DC.  OT Frequency: Min 3X/week   Barriers to D/C:            Co-evaluation              AM-PAC OT "6 Clicks" Daily Activity     Outcome Measure Help from another person eating meals?: None Help from another person taking care of personal grooming?: A Little Help from another person toileting, which includes using toliet, bedpan, or urinal?: A Little Help from another person bathing (including washing, rinsing, drying)?: A Lot Help from another person to put on and taking off regular upper body clothing?: A Little Help from another person to put on and taking off regular lower body clothing?: A Lot 6 Click Score: 17   End of Session Equipment Utilized During Treatment: Gait belt;Rolling walker  Activity Tolerance: Patient tolerated treatment well Patient left: in chair;with call bell/phone within reach;with chair alarm set  OT Visit Diagnosis: Other abnormalities of gait and mobility (R26.89);Pain Pain - Right/Left: Right Pain - part of body: Hip;Knee                Time: 1749-4496 OT Time Calculation (min): 35 min Charges:  OT General Charges $OT Visit: 1 Visit OT Evaluation $OT Eval Moderate Complexity: 1 Mod OT Treatments $Self Care/Home Management : 23-37 mins  Shara Blazing, M.S., OTR/L Ascom: [phone] 08/12/20, 10:16 AM

## 2020-08-12 NOTE — Plan of Care (Signed)

## 2020-08-12 NOTE — Progress Notes (Signed)
Rehab Admissions Coordinator Note:  Patient was screened by Cleatrice Burke for appropriateness for an Inpatient Acute Rehab Consult for possible Cir admit at [facility] in [place]. .  At this time, we are recommending Inpatient Rehab consult. I will place order per protocol.  Cleatrice Burke RN MSN 08/12/2020, 12:19 PM  I can be reached at [phone].

## 2020-08-12 NOTE — Evaluation (Signed)
Physical Therapy Evaluation Patient Details Name: Sara [name] MRN: [id number] DOB: 1951/08/19 Today's Date: 08/12/2020   History of Present Illness  Per MD notes: Sara [name] is a 69 y.o. 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 PMH includes: arthritis, GERD, HLD, HTN, vertigo, vascular disease, and hx of blood clots.    Clinical Impression  Pt was pleasant and motivated to participate during the session.  Pt was mod Ind with bed mobility tasks and generally steady with transfers with cuing for proper sequencing and hand placement.  Pt was able to take several small hop-to steps from chair to bed with good stability with cues for proper sequencing most notably to stay closer to the walker.  Pt was very motivated bordering on minimally impulsive at times but overall performed very well during the session and should make very good progress towards goals.  Pt will benefit from IR services upon discharge to safely address deficits listed in patient problem list for decreased caregiver assistance and eventual return to PLOF.      Follow Up Recommendations CIR    Equipment Recommendations  3in1 (PT)    Recommendations for Other Services       Precautions / Restrictions Precautions Precautions: Fall Precaution Comments: Moderate Fall Restrictions Weight Bearing Restrictions: Yes RLE Weight Bearing: Non weight bearing Other Position/Activity Restrictions: s/p R AKA      Mobility  Bed Mobility Overal bed mobility: Modified Independent             General bed mobility comments: Extra time and effort only    Transfers Overall transfer level: Needs assistance Equipment used: Rolling walker (2 wheeled) Transfers: Sit to/from Stand Sit to Stand: Min guard Stand pivot transfers: Min guard       General transfer comment: Fair eccentric and concentric control and stability with mod verbal cues for proper sequencing  Ambulation/Gait Ambulation/Gait assistance: Min  guard Gait Distance (Feet): 2 Feet Assistive device: Rolling walker (2 wheeled)   Gait velocity: decreased   General Gait Details: Hop-to pattern from chair to bed with visual and verbal cues for sequencing with pt steady without LOB or buckling  Stairs            Wheelchair Mobility    Modified Rankin (Stroke Patients Only)       Balance Overall balance assessment: Needs assistance Sitting-balance support: Single extremity supported Sitting balance-Leahy Scale: Good Sitting balance - Comments: Min posterior LOB appreciated during stand>sit t/f. Postural control: Posterior lean Standing balance support: During functional activity;Bilateral upper extremity supported Standing balance-Leahy Scale: Fair Standing balance comment: Heavy reliance on BUE support for balance in standing.                             Pertinent Vitals/Pain Pain Assessment: 0-10 Pain Score: 6  Faces Pain Scale: Hurts a little bit Pain Location: RLE Pain Descriptors / Indicators: Aching;Sore Pain Intervention(s): Monitored during session;RN gave pain meds during session    [place] expects to be discharged to:: Private residence Living Arrangements: Alone Available Help at Discharge: Friend(s);Available 24 hours/day Type of Home: Apartment Home Access: Ramped entrance     Home Layout: One level Home Equipment: Clinical cytogeneticist - 2 wheels      Prior Function Level of Independence: Independent         Comments: Ind Amb without an AD, no fall history, Ind with ADLs     Hand Dominance  Dominant Hand: Right    Extremity/Trunk Assessment   Upper Extremity Assessment Upper Extremity Assessment: Overall WFL for tasks assessed    Lower Extremity Assessment Lower Extremity Assessment: Generalized weakness RLE Deficits / Details: s/p R AKA    Cervical / Trunk Assessment Cervical / Trunk Assessment: Normal  Communication   Communication: No  difficulties  Cognition Arousal/Alertness: Awake/alert Behavior During Therapy: WFL for tasks assessed/performed Overall Cognitive Status: Within Functional Limits for tasks assessed                                 General Comments: Pt A&Ox4, pleasant, motivated throughout the session, mildly impulsive      General Comments General comments (skin integrity, edema, etc.): RLE bandages clean/intact at start/end of session.    Exercises Total Joint Exercises Ankle Circles/Pumps: Strengthening;Left;10 reps;5 reps Quad Sets: Strengthening;Left;5 reps;10 reps Gluteal Sets: Strengthening;Both;10 reps;5 reps Heel Slides: Strengthening;Left;5 reps Straight Leg Raises: Strengthening;Left;5 reps Long Arc Quad: Strengthening;AROM;Left;10 reps;5 reps Knee Flexion: AROM;Strengthening;Left;5 reps;10 reps Standing Hip Extension: Sidelying;Right;5 reps;10 reps Other Exercises Other Exercises: PT education provided on HEP including APs, QS, GS, LAQs, and left sidelying R hip ext   Assessment/Plan    PT Assessment Patient needs continued PT services  PT Problem List Decreased strength;Decreased activity tolerance;Decreased balance;Decreased mobility;Decreased knowledge of use of DME;Pain       PT Treatment Interventions DME instruction;Gait training;Functional mobility training;Therapeutic activities;Therapeutic exercise;Balance training;Patient/family education    PT Goals (Current goals can be found in the Care Plan section)  Acute Rehab PT Goals Patient Stated Goal: "To not let this surgery slow me down" PT Goal Formulation: With patient Time For Goal Achievement: 08/25/20 Potential to Achieve Goals: Good    Frequency 7X/week   Barriers to discharge Inaccessible home environment;Decreased caregiver support      Co-evaluation               AM-PAC PT "6 Clicks" Mobility  Outcome Measure Help needed turning from your back to your side while in a flat bed without  using bedrails?: A Little Help needed moving from lying on your back to sitting on the side of a flat bed without using bedrails?: A Little Help needed moving to and from a bed to a chair (including a wheelchair)?: A Little Help needed standing up from a chair using your arms (e.g., wheelchair or bedside chair)?: A Little Help needed to walk in hospital room?: A Lot Help needed climbing 3-5 steps with a railing? : A Lot 6 Click Score: 16    End of Session Equipment Utilized During Treatment: Gait belt Activity Tolerance: Patient tolerated treatment well Patient left: in bed;with call bell/phone within reach;with bed alarm set Nurse Communication: Mobility status PT Visit Diagnosis: Other abnormalities of gait and mobility (R26.89);Muscle weakness (generalized) (M62.81);Pain Pain - Right/Left: Right Pain - part of body: Leg    Time: [id number] PT Time Calculation (min) (ACUTE ONLY): 27 min   Charges:   PT Evaluation $PT Eval Moderate Complexity: 1 Mod PT Treatments $Therapeutic Exercise: 8-22 mins        D. Royetta Asal PT, DPT 08/12/20, 1:38 PM

## 2020-08-12 NOTE — Progress Notes (Signed)
Inpatient Rehabilitation-Admissions Coordinator   CIR consult received. Spoke with pt via phone this afternoon regarding recommended rehab program. At this time the patient would like to see how she does tomorrow (and preferably over the weekend) before deciding where to go for rehab ([facility] vs [facility]). Will follow up with pt tomorrow after next PT/OT session to see if pt qualifies for CIR.   Raechel Ache, OTR/L  Rehab Admissions Coordinator  [phone] 08/12/2020 5:23 PM

## 2020-08-12 NOTE — Plan of Care (Signed)

## 2020-08-13 LAB — CBC
HCT: 28.8 % — ABNORMAL LOW (ref 36.0–46.0)
Hemoglobin: 9.6 g/dL — ABNORMAL LOW (ref 12.0–15.0)
MCH: 32.1 pg (ref 26.0–34.0)
MCHC: 33.3 g/dL (ref 30.0–36.0)
MCV: 96.3 fL (ref 80.0–100.0)
Platelets: 129 10*3/uL — ABNORMAL LOW (ref 150–400)
RBC: 2.99 MIL/uL — ABNORMAL LOW (ref 3.87–5.11)
RDW: 12.6 % (ref 11.5–15.5)
WBC: 13.2 10*3/uL — ABNORMAL HIGH (ref 4.0–10.5)
nRBC: 0 % (ref 0.0–0.2)

## 2020-08-13 LAB — BASIC METABOLIC PANEL
Anion gap: 6 (ref 5–15)
BUN: 9 mg/dL (ref 8–23)
CO2: 29 mmol/L (ref 22–32)
Calcium: 8.5 mg/dL — ABNORMAL LOW (ref 8.9–10.3)
Chloride: 107 mmol/L (ref 98–111)
Creatinine, Ser: 0.64 mg/dL (ref 0.44–1.00)
GFR, Estimated: >60 mL/min (ref >60)
Glucose, Bld: 119 mg/dL — ABNORMAL HIGH (ref 70–99)
Potassium: 4 mmol/L (ref 3.5–5.1)
Sodium: 142 mmol/L (ref 135–145)

## 2020-08-13 MED ORDER — SENNA 8.6 MG PO TABS
5.0000 | ORAL_TABLET | Freq: Once | ORAL | Status: AC
  Administered 2020-08-13: 43 mg via ORAL
  Filled 2020-08-13: qty 5

## 2020-08-13 NOTE — Care Management Important Message (Signed)
Important Message  Patient Details  Name: Sara [name] MRN: [id number] Date of Birth: [birth year]   Medicare Important Message Given:  N/A - LOS <3 / Initial given by admissions     [name] 08/13/2020, 8:22 AM

## 2020-08-13 NOTE — TOC Initial Note (Addendum)
Transition of Care [facility]) - Initial/Assessment Note    Patient Details  Name: Sara [name] MRN: [id number] Date of Birth: June 01, 1951  [facility] CM/SW Contact:    Shelbie Ammons, RN Phone Number: 08/13/2020, 11:25 AM  Clinical Narrative:      RNCM met with patient in room. Patient is up getting ready to work with therapy and reports to feeling well today. Patient reports that she is in agreement with plan to go home with home health and would like to have [facility] again, specifically requesting [facility] and [facility] for PT. Patient is also agreeable to DME of a 3N1 being delivered but reports she has a rolling walker at home.  RNCM reached out to [facility] with [facility] and he will accept referral for home health.      RNCM reached out to [facility] with [facility] and she will supply rolling walker.    Expected Discharge Plan: [place] Barriers to Discharge: No Barriers Identified   Patient Goals and CMS Choice        Expected Discharge Plan and Services Expected Discharge Plan: [place]       Living arrangements for the past 2 months: [place]: RN,PT,OT [place] Agency: [facility] ([place]) Date HH Agency Contacted: 08/13/20 Time HH Agency Contacted: 1120 Representative spoke with at [place]: [place] Arrangements/Services Living arrangements for the past 2 months: [place] Lives with:: Self Patient language and need for interpreter reviewed:: Yes Do you feel safe going back to the place where you live?: Yes      Need for Family Participation in Patient Care: Yes (Comment) Care giver support system in place?: Yes (comment)   Criminal Activity/Legal Involvement Pertinent to Current Situation/Hospitalization: No - Comment as needed  Activities of Daily Living Home Assistive Devices/Equipment: Dentures (specify type),Walker (specify type) ADL Screening  (condition at time of admission) Patient's cognitive ability adequate to safely complete daily activities?: Yes Is the patient deaf or have difficulty hearing?: No Does the patient have difficulty seeing, even when wearing glasses/contacts?: No Does the patient have difficulty concentrating, remembering, or making decisions?: No Patient able to express need for assistance with ADLs?: Yes Does the patient have difficulty dressing or bathing?: No Independently performs ADLs?: Yes (appropriate for developmental age) Does the patient have difficulty walking or climbing stairs?: Yes Weakness of Legs: None Weakness of Arms/Hands: None  Permission Sought/Granted                  Emotional Assessment Appearance:: Appears stated age Attitude/Demeanor/Rapport: Engaged Affect (typically observed): Appropriate,Calm Orientation: : Oriented to Self,Oriented to Place,Oriented to  Time,Oriented to Situation Alcohol / Substance Use: Not Applicable Psych Involvement: No (comment)  Admission diagnosis:  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 Ischemia of extremity 07/02/2020  . Chronic pain in right shoulder 11/25/2019  . Encounter for long-term (current) use of high-risk medication 07/16/2019  . GCA (giant cell arteritis) ([place]) 07/16/2019  . Temporal arteritis ([place]) 07/13/2019  . Postoperative wound infection 06/23/2019  . Ischemic leg 05/31/2019  . Atherosclerosis of native arteries of extremity with intermittent claudication ([place]) 05/30/2019  . Depression 05/29/2019  . Eczema of lower extremity 03/04/2018  .  Chronic venous insufficiency 03/02/2018  . Personal history of colonic polyps   . Family history of colonic polyps   . Benign neoplasm of ascending colon   . Mitral valve insufficiency 02/08/2017  . Dyspnea on exertion 02/07/2017  . Non-rheumatic mitral regurgitation 02/07/2017  . Precordial pain  02/07/2017  . CAD (coronary artery disease) 12/21/2016  . Essential hypertension 12/21/2016  . Compression fracture of lumbar spine, non-traumatic ([place]) 04/26/2016  . Compression fracture of L3 lumbar vertebra 04/20/2016  . PVD (peripheral vascular disease) ([place]) 02/24/2016  . Family history of premature CAD 02/24/2016  . Gastritis   . Other specified diseases of esophagus   . Hiatal hernia   . Gastritis and gastroduodenitis   . Ischemia of lower extremity   . Arterial occlusion ([place])   . Atherosclerosis of aorta ([place])   . History of smoking   . Hyperlipidemia   . Pain in the chest   . 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 Carotid artery stenosis 01/04/2016  . Vertigo, benign paroxysmal 12/28/2015  . Clinical depression 09/06/2015  . History of alcoholism ([place]) 09/06/2015  . Angiopathy, 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[place]) 09/06/2015  . Atherosclerosis of native arteries of extremity with rest pain ([place]) 09/06/2015  . Acute non-recurrent maxillary sinusitis 07/14/2015  . Vaginal pruritus 05/26/2015  . Chronic recurrent major depressive disorder ([place]) 03/09/2015  . 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 Peripheral blood vessel disorder ([place]) 03/09/2015  . Acid reflux 03/09/2015  . GERD (gastroesophageal reflux disease) 12/21/2014  . Carotid artery narrowing 01/28/2014  . Peripheral arterial occlusive disease ([place]) 06/08/2011  . Occlusion and stenosis of unspecified carotid artery 06/08/2011  . PAD (peripheral artery disease) ([place]) 06/08/2011   PCP:  Danelle Berry, NP Pharmacy:   [facility] [address] [place] [address] Phone: [phone] Fax: [phone]  [id number] [id number]: [phone]: [phone]    Social Determinants of Health (SDOH) Interventions     Readmission Risk Interventions No flowsheet data found.

## 2020-08-13 NOTE — Progress Notes (Signed)
[facility]  Daily Progress Note   Subjective  - 2 Days Post-Op  Patient states she is having minimal pain and is exercising her right leg  Objective Vitals:   08/13/20 0540 08/13/20 0754 08/13/20 1203 08/13/20 1553  BP: (!) 93/53 102/61 114/68 (!) 125/57  Pulse: 73 74 80 83  Resp: _0 Temp: 98 F (36.7 C) 98.3 F (36.8 C) 98.1 F (36.7 C) 98.8 F (37.1 C)  TempSrc:    Oral  SpO2: 95% 95% 100% 96%  Weight:      Height:        Intake/Output Summary (Last 24 hours) at 08/13/2020 1620 Last data filed at 08/13/2020 1059 Gross per 24 hour  Intake 1030 ml  Output 350 ml  Net 680 ml    PULM  Normal effort , no use of accessory muscles CV  No JVD, RRR Abd      No distended, nontender VASC  dressing clean dry and intact  Laboratory CBC    Component Value Date/Time   WBC 13.2 (H) 08/13/2020 0603   HGB 9.6 (L) 08/13/2020 0603   HGB 14.4 09/21/2015 0916   HCT 28.8 (L) 08/13/2020 0603   HCT 44.4 09/21/2015 0916   PLT 129 (L) 08/13/2020 0603   PLT 222 09/21/2015 0916    BMET    Component Value Date/Time   NA 142 08/13/2020 0603   NA 139 09/21/2015 0916   K 4.0 08/13/2020 0603   CL 107 08/13/2020 0603   CO2 29 08/13/2020 0603   GLUCOSE 119 (H) 08/13/2020 0603   BUN 9 08/13/2020 0603   BUN 12 09/21/2015 0916   CREATININE 0.64 08/13/2020 0603   CREATININE 0.88 05/16/2016 1034   CALCIUM 8.5 (L) 08/13/2020 0603   GFRNONAA >60 08/13/2020 0603   GFRNONAA 66 05/01/2016 0846   GFRAA >60 02/18/2020 0949   GFRAA 76 05/01/2016 0846    Assessment/Planning: POD #2 s/p right AKA  Patient is doing very well she is exercising her right hip when I entered the room.  She has met with the prosthetist.  We will plan for discharge on Monday and subsequent home PT OT    [name]  08/13/2020, 4:20 PM

## 2020-08-13 NOTE — Progress Notes (Signed)
Physical Therapy Treatment Patient Details Name: Sara [name] MRN: [id number] DOB: 1951-03-12 Today's Date: 08/13/2020    History of Present Illness Per MD notes: Sara [name] is a 69 y.o. 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 PMH includes: arthritis, GERD, HLD, HTN, vertigo, vascular disease, and hx of blood clots.    PT Comments    Pt was pleasant and motivated to participate during the session and made very good progress towards goals.  Pt was steady with transfers from various height surfaces with good eccentric and concentric control and was able to amb 40 feet with hop-to gait with good stability throughout.  Pt reported that she will have a friend who can be with her 24/7 upon discharge and that another friend is going to loan her a motorized scooter.  Pt will benefit from HHPT services upon discharge to safely address deficits listed in patient problem list for decreased caregiver assistance and eventual return to PLOF.     Follow Up Recommendations  Home health PT;Supervision/Assistance - 24 hour     Equipment Recommendations  3in1 (PT)    Recommendations for Other Services       Precautions / Restrictions Precautions Precautions: Fall Precaution Comments: Moderate Fall Restrictions Weight Bearing Restrictions: Yes RLE Weight Bearing: Non weight bearing Other Position/Activity Restrictions: s/p R AKA    Mobility  Bed Mobility Overal bed mobility: Modified Independent             General bed mobility comments: Extra time and effort but no physical assistance needed  Transfers Overall transfer level: Needs assistance Equipment used: Rolling walker (2 wheeled) Transfers: Sit to/from Stand Sit to Stand: Supervision         General transfer comment: Fair eccentric and concentric control and stability with min verbal cues for proper sequencing, most notably for hand placement  Ambulation/Gait Ambulation/Gait assistance: Min guard Gait  Distance (Feet): 40 Feet Assistive device: Rolling walker (2 wheeled)   Gait velocity: decreased   General Gait Details: Hop-to pattern with min verbal cues for sequencing with pt steady without LOB or buckling   Stairs             Wheelchair Mobility    Modified Rankin (Stroke Patients Only)       Balance Overall balance assessment: Needs assistance Sitting-balance support: No upper extremity supported;Feet supported Sitting balance-Leahy Scale: Good Sitting balance - Comments: Steady static/dynamic sitting balance during functional tasks. Pt is able to reach to LLE for LB dressing tasks w/o LOB appreciated during weight shift.   Standing balance support: During functional activity;Bilateral upper extremity supported Standing balance-Leahy Scale: Good Standing balance comment: Min lean on the RW for support in standing                            Cognition Arousal/Alertness: Awake/alert Behavior During Therapy: WFL for tasks assessed/performed Overall Cognitive Status: Within Functional Limits for tasks assessed                                 General Comments: Pt A&Ox4, pleasant, motivated throughout the session, demonstrates improved safety awareness.      Exercises Total Joint Exercises Ankle Circles/Pumps: Strengthening;Left;10 reps Quad Sets: Strengthening;Left;10 reps Gluteal Sets: Strengthening;Both;10 reps Long Arc Quad: Strengthening;Left;10 reps;15 reps (with manual resistance.) Knee Flexion: Strengthening;Left;10 reps;15 reps (with manual resistance.) Standing Hip Extension: Sidelying;Right;10 reps;Standing Other Exercises Other  Exercises: PT education/review on HEP including APs, QS, GS, LAQs, and left sidelying R hip ext Other Exercises: Pt education provided on local amputee support groups and benefits of support going forward Other Exercises: OT facilitates LB dressing tasks from STS with education on compensatory  strategies and supervision for safety provided t/o. See ADL section for additional details.    General Comments General comments (skin integrity, edema, etc.): RLE bandages clean/intact at start/end of session.      Pertinent Vitals/Pain Pain Assessment: No/denies pain Pain Score: 2  Pain Location: RLE with certain movements (particularly hip extension)    Home Living                      Prior Function            PT Goals (current goals can now be found in the care plan section) Acute Rehab PT Goals Patient Stated Goal: "To not let this surgery slow me down" Progress towards PT goals: Progressing toward goals    Frequency    7X/week      PT Plan Discharge plan needs to be updated    Co-evaluation              AM-PAC PT "6 Clicks" Mobility   Outcome Measure  Help needed turning from your back to your side while in a flat bed without using bedrails?: A Little Help needed moving from lying on your back to sitting on the side of a flat bed without using bedrails?: A Little Help needed moving to and from a bed to a chair (including a wheelchair)?: A Little Help needed standing up from a chair using your arms (e.g., wheelchair or bedside chair)?: A Little Help needed to walk in hospital room?: A Little Help needed climbing 3-5 steps with a railing? : A Lot 6 Click Score: 17    End of Session Equipment Utilized During Treatment: Gait belt Activity Tolerance: Patient tolerated treatment well Patient left: in chair;with call bell/phone within reach;with chair alarm set Nurse Communication: Mobility status PT Visit Diagnosis: Other abnormalities of gait and mobility (R26.89);Muscle weakness (generalized) (M62.81);Pain Pain - Right/Left: Right Pain - part of body: Leg     Time: 9163-8466 PT Time Calculation (min) (ACUTE ONLY): 39 min  Charges:  $Gait Training: 8-22 mins $Therapeutic Exercise: 8-22 mins $Therapeutic Activity: 8-22 mins                      D. Scott Leslee Haueter PT, DPT 08/13/20, 1:02 PM

## 2020-08-13 NOTE — Plan of Care (Signed)

## 2020-08-13 NOTE — Progress Notes (Signed)
Occupational Therapy Treatment Patient Details Name: Sara [name] MRN: [id number] DOB: 08-02-1951 Today's Date: 08/13/2020    History of present illness Per MD notes: Sara [name] is a 69 y.o. 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 PMH includes: arthritis, GERD, HLD, HTN, vertigo, vascular disease, and hx of blood clots.   OT comments  Sara [name] was seen for OT treatment on this date. Upon arrival to room pt awake/alert, standing with RW at EOB, finishing daily oral care. Pt denies pain this AM and agreeable to OT tx session. Pt return verbalizes understanding of residual limb management techniques including gentle self-massage, which she reports has proven helpful for residual/phantom limb pain overnight. Pt eager to discuss DC options and whether or not she should return home. Pt and provider problem solve regular IADL/ADL tasks including dressing and bathing strategies, considerations for safety with dog walking and house cleaning, and falls prevention. Pt eager to trial LB dressing with compensatory strategies and is able to don LB clothing with supervision for safety from STS. See ADL section below for additional detail regarding occupational performance. Pt continues to benefit from skilled OT services to maximize return to PLOF and minimize risk of future falls, injury, caregiver burden, and readmission. Pt making excellent progress toward goals, DC recommendation updated to [place] in light of pt progress, frequency changed to 2x/week. Will continue to follow POC as written.    Follow Up Recommendations  Home health OT;Supervision - Intermittent    Equipment Recommendations  3 in 1 bedside commode;Tub/shower bench (2WW)    Recommendations for Other Services      Precautions / Restrictions Precautions Precautions: Fall Precaution Comments: Moderate Fall Restrictions Weight Bearing Restrictions: Yes RLE Weight Bearing: Non weight bearing Other Position/Activity  Restrictions: s/p R AKA       Mobility Bed Mobility               General bed mobility comments: Deferred. Pt standing in room with RW upon OT arrival.  Transfers Overall transfer level: Needs assistance Equipment used: Rolling walker (2 wheeled) Transfers: Sit to/from Stand Sit to Stand: Modified independent (Device/Increase time);Supervision              Balance Overall balance assessment: Needs assistance Sitting-balance support: No upper extremity supported;Feet supported Sitting balance-Leahy Scale: Good Sitting balance - Comments: Steady static/dynamic sitting balance during functional tasks. Pt is able to reach to LLE for LB dressing tasks w/o LOB appreciated during weight shift.   Standing balance support: During functional activity;Bilateral upper extremity supported Standing balance-Leahy Scale: Fair                             ADL either performed or assessed with clinical judgement   ADL   Eating/Feeding: Independent;Sitting   Grooming: Modified independent;Sitting;Standing               Lower Body Dressing: Supervision/safety;Set up Lower Body Dressing Details (indicate cue type and reason): Pt performs LB dressing tasks inclulding doffing/donning sock on no-operative extremity, donning mesh underwear and sweat pants from STS, and donning L shoe. Pt performs all tasks with supervision/set-up assist and min cueing for compensatory strategies during session. She demonstrates good safety awareness and understanding of falls prevention strategies as discussed at past OT session. Toilet Transfer: Modified Independent;RW;BSC;Ambulation   Toileting- Water quality scientist and Hygiene: Modified independent;Sit to/from stand Toileting - Clothing Manipulation Details (indicate cue type and reason): c RW for STS  peri-care.     Functional mobility during ADLs: Supervision/safety;Rolling walker       Vision Baseline Vision/History: Wears  glasses Patient Visual Report: No change from baseline     Perception     Praxis      Cognition Arousal/Alertness: Awake/alert Behavior During Therapy: WFL for tasks assessed/performed Overall Cognitive Status: Within Functional Limits for tasks assessed                                 General Comments: Pt A&Ox4, pleasant, motivated throughout the session, demonstrates improved safety awareness.        Exercises Other Exercises Other Exercises: Pt education re: falls prevention strategies, IADL management strategies (including safety considerations for house cleaning, as pt eager to vacuum, upon DC home and pet management), residual limb management techniques including desensitization (pt return verbalizes/demonstrates excellent understanding of prior education). Other Exercises: OT facilitates LB dressing tasks from STS with education on compensatory strategies and supervision for safety provided t/o. See ADL section for additional details.   Shoulder Instructions       General Comments RLE bandages clean/intact at start/end of session.    Pertinent Vitals/ Pain       Pain Score: 2  Pain Location: RLE with certain movements (particularly hip extension)  Home Living                                          Prior Functioning/Environment              Frequency  Min 2X/week        Progress Toward Goals  OT Goals(current goals can now be found in the care plan section)  Progress towards OT goals: Progressing toward goals  Acute Rehab OT Goals Patient Stated Goal: "To not let this surgery slow me down" OT Goal Formulation: With patient Time For Goal Achievement: 08/26/20 Potential to Achieve Goals: Good  Plan Discharge plan needs to be updated;Frequency needs to be updated    Co-evaluation                 AM-PAC OT "6 Clicks" Daily Activity     Outcome Measure   Help from another person eating meals?: None Help from  another person taking care of personal grooming?: None Help from another person toileting, which includes using toliet, bedpan, or urinal?: A Little Help from another person bathing (including washing, rinsing, drying)?: A Little Help from another person to put on and taking off regular upper body clothing?: None Help from another person to put on and taking off regular lower body clothing?: A Little 6 Click Score: 21    End of Session Equipment Utilized During Treatment: Gait belt;Rolling walker  OT Visit Diagnosis: Other abnormalities of gait and mobility (R26.89);Pain Pain - Right/Left: Right Pain - part of body: Hip;Knee   Activity Tolerance     Patient Left in chair;with call bell/phone within reach;with chair alarm set   Nurse Communication Mobility status        Time: 1005-1037 OT Time Calculation (min): 32 min  Charges: OT General Charges $OT Visit: 1 Visit OT Treatments $Self Care/Home Management : 23-37 mins  Shara Blazing, M.S., OTR/L Ascom: [phone] 08/13/20, 12:08 PM

## 2020-08-13 NOTE — Progress Notes (Signed)
Inpatient Rehabilitation-Admissions Coordinator   Spoke to pt via phone as follow up from rehab conversation yesterday. Pt now wants to DC home and not pursue CIR. Per OT, therapy is recommending Home with [facility] as pt is progressing well.   Due to pt preference for home health and recent progress, AC will sign off.   Raechel Ache, OTR/L  Rehab Admissions Coordinator  [phone] 08/13/2020 11:18 AM

## 2020-08-14 MED ORDER — ZINC OXIDE 40 % EX OINT
TOPICAL_OINTMENT | Freq: Two times a day (BID) | CUTANEOUS | Status: DC | PRN
  Filled 2020-08-14: qty 113

## 2020-08-14 NOTE — Progress Notes (Signed)
3 Days Post-Op   Subjective/Chief Complaint: Doing well. Denies pain in RIGHT AKA stump.   Objective: Vital signs in last 24 hours: Temp:  [98.2 F (36.8 C)-98.8 F (37.1 C)] 98.2 F (36.8 C) (12/25 0814) Pulse Rate:  [79-88] 83 (12/25 0814) Resp:  [15-19] 19 (12/25 0814) BP: (100-125)/(51-61) 118/51 (12/25 0814) SpO2:  [93 %-99 %] 95 % (12/25 0814) Last BM Date: 08/13/20  Intake/Output from previous day: 12/24 0701 - 12/25 0700 In: 360 [P.O.:360] Out: -  Intake/Output this shift: No intake/output data recorded.  General appearance: alert and no distress Cardio: regular rate and rhythm Extremities: RIGHT AKA dressing- C/D/I, thigh soft  Lab Results:  Recent Labs    08/12/20 0531 08/13/20 0603  WBC 10.1 13.2*  HGB 10.7* 9.6*  HCT 31.9* 28.8*  PLT 164 129*   BMET Recent Labs    08/12/20 0531 08/13/20 0603  NA 138 142  K 4.0 4.0  CL 108 107  CO2 23 29  GLUCOSE 207* 119*  BUN 10 9  CREATININE 0.71 0.64  CALCIUM 8.0* 8.5*   PT/INR No results for input(s): LABPROT, INR in the last 72 hours. ABG No results for input(s): PHART, HCO3 in the last 72 hours.  Invalid input(s): PCO2, PO2  Studies/Results: No results found.  Anti-infectives: Anti-infectives (From admission, onward)   Start     Dose/Rate Route Frequency Ordered Stop   08/12/20 0600  clindamycin (CLEOCIN) IVPB 300 mg        300 mg 100 mL/hr over 30 Minutes Intravenous On call to O.R. 08/11/20 1231 08/11/20 1722   08/11/20 2200  ceFAZolin (ANCEF) IVPB 2g/100 mL premix        2 g 200 mL/hr over 30 Minutes Intravenous Every 8 hours 08/11/20 2035 08/12/20 0709   08/11/20 1232  clindamycin (CLEOCIN) 300 MG/50ML IVPB       Note to Pharmacy: Ronnell Freshwater   : cabinet override      08/11/20 1232 08/11/20 1719      Assessment/Plan: s/p Procedure(s): AMPUTATION ABOVE KNEE (Right) POD #3 Will change dressing tomorrow.   Continue OOB with PT Plan for discharge on Monday  LOS: 3 days     Jamesetta So A 08/14/2020

## 2020-08-14 NOTE — Plan of Care (Signed)

## 2020-08-15 MED ORDER — GABAPENTIN 600 MG PO TABS
600.0000 mg | ORAL_TABLET | Freq: Two times a day (BID) | ORAL | Status: DC
  Administered 2020-08-15 – 2020-08-16 (×3): 600 mg via ORAL
  Filled 2020-08-15 (×3): qty 1

## 2020-08-15 NOTE — Progress Notes (Signed)
4 Days Post-Op   Subjective/Chief Complaint: Doing Well. Denies Pain in stump. Notes phantom pain has increased but tolerable.   Objective: Vital signs in last 24 hours: Temp:  [98.4 F (36.9 C)-99.6 F (37.6 C)] 98.8 F (37.1 C) (12/26 0838) Pulse Rate:  [71-94] 79 (12/26 0838) Resp:  [15-20] 18 (12/26 0838) BP: (97-112)/(54-61) 107/61 (12/26 0838) SpO2:  [95 %-100 %] 95 % (12/26 0838) Last BM Date: 08/14/20  Intake/Output from previous day: 12/25 0701 - 12/26 0700 In: 240 [P.O.:240] Out: 0  Intake/Output this shift: No intake/output data recorded.  General appearance: alert and no distress Cardio: regular rate and rhythm Extremities: RIGHT AKA- incision- C/D/I, ecchymosis, soft, warm, viable  Lab Results:  Recent Labs    08/13/20 0603  WBC 13.2*  HGB 9.6*  HCT 28.8*  PLT 129*   BMET Recent Labs    08/13/20 0603  NA 142  K 4.0  CL 107  CO2 29  GLUCOSE 119*  BUN 9  CREATININE 0.64  CALCIUM 8.5*   PT/INR No results for input(s): LABPROT, INR in the last 72 hours. ABG No results for input(s): PHART, HCO3 in the last 72 hours.  Invalid input(s): PCO2, PO2  Studies/Results: No results found.  Anti-infectives: Anti-infectives (From admission, onward)   Start     Dose/Rate Route Frequency Ordered Stop   08/12/20 0600  clindamycin (CLEOCIN) IVPB 300 mg        300 mg 100 mL/hr over 30 Minutes Intravenous On call to O.R. 08/11/20 1231 08/11/20 1722   08/11/20 2200  ceFAZolin (ANCEF) IVPB 2g/100 mL premix        2 g 200 mL/hr over 30 Minutes Intravenous Every 8 hours 08/11/20 2035 08/12/20 0709   08/11/20 1232  clindamycin (CLEOCIN) 300 MG/50ML IVPB       Note to Pharmacy: Ronnell Freshwater   : cabinet override      08/11/20 1232 08/11/20 1719      Assessment/Plan: s/p Procedure(s): AMPUTATION ABOVE KNEE (Right) POD #4 s/p RIGHT AKA  Will begin Neurontin for Phantom pain Plan for D/C home tomorrow after PT assessment  LOS: 4 days    Jamesetta So A 08/15/2020

## 2020-08-15 NOTE — Progress Notes (Signed)
Physical Therapy Treatment Patient Details Name: Sara [name] MRN: [id number] DOB: 09/07/1950 Today's Date: 08/15/2020    History of Present Illness Per MD notes: Sara [name] is a 69 y.o. 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 PMH includes: arthritis, GERD, HLD, HTN, vertigo, vascular disease, and hx of blood clots.    PT Comments    Stood and is able to complete 50' gait with RW and min guard.  Stated she is doing HEP on her own without difficulty.  Stood for hip ext ex and education.  Returned to bed for breakfast.  Has a ramp at home.  No stairs.  Stated she is comfortable with discharge home tomorrow.   Follow Up Recommendations  Home health PT;Supervision/Assistance - 24 hour     Equipment Recommendations  3in1 (PT)    Recommendations for Other Services       Precautions / Restrictions Precautions Precautions: Fall Precaution Comments: Moderate Fall Restrictions Weight Bearing Restrictions: Yes RLE Weight Bearing: Non weight bearing Other Position/Activity Restrictions: s/p R AKA    Mobility  Bed Mobility Overal bed mobility: Modified Independent                Transfers Overall transfer level: Needs assistance Equipment used: Rolling walker (2 wheeled) Transfers: Sit to/from Stand Sit to Stand: Supervision            Ambulation/Gait Ambulation/Gait assistance: Min guard Gait Distance (Feet): 50 Feet Assistive device: Rolling walker (2 wheeled)   Gait velocity: decreased   General Gait Details: Hop-to pattern with min verbal cues for sequencing with pt steady without LOB or buckling   Stairs             Wheelchair Mobility    Modified Rankin (Stroke Patients Only)       Balance Overall balance assessment: Needs assistance Sitting-balance support: No upper extremity supported;Feet supported Sitting balance-Leahy Scale: Good     Standing balance support: During functional activity;Bilateral upper extremity  supported Standing balance-Leahy Scale: Good Standing balance comment: Min lean on the RW for support in standing                            Cognition Arousal/Alertness: Awake/alert Behavior During Therapy: WFL for tasks assessed/performed Overall Cognitive Status: Within Functional Limits for tasks assessed                                        Exercises Other Exercises Other Exercises: standing hip ext and education.  doing HEP on her own.    General Comments        Pertinent Vitals/Pain Pain Assessment: Faces Faces Pain Scale: Hurts little more Pain Location: c/o phantom pains Pain Descriptors / Indicators: Aching;Sore Pain Intervention(s): Limited activity within patient's tolerance;Monitored during session;Repositioned    Home Living                      Prior Function            PT Goals (current goals can now be found in the care plan section) Progress towards PT goals: Progressing toward goals    Frequency    7X/week      PT Plan Current plan remains appropriate    Co-evaluation              AM-PAC PT "6 Clicks" Mobility  Outcome Measure  Help needed turning from your back to your side while in a flat bed without using bedrails?: None Help needed moving from lying on your back to sitting on the side of a flat bed without using bedrails?: None Help needed moving to and from a bed to a chair (including a wheelchair)?: A Little Help needed standing up from a chair using your arms (e.g., wheelchair or bedside chair)?: A Little Help needed to walk in hospital room?: A Little Help needed climbing 3-5 steps with a railing? : A Lot 6 Click Score: 19    End of Session Equipment Utilized During Treatment: Gait belt Activity Tolerance: Patient tolerated treatment well Patient left: in bed;with call bell/phone within reach;with bed alarm set Nurse Communication: Mobility status Pain - Right/Left: Right Pain -  part of body: Leg     Time: 1308-6578 PT Time Calculation (min) (ACUTE ONLY): 13 min  Charges:  $Gait Training: 8-22 mins                    Chesley Noon, PTA 08/15/20, 10:31 AM

## 2020-08-16 LAB — SURGICAL PATHOLOGY

## 2020-08-16 MED ORDER — OXYCODONE-ACETAMINOPHEN 5-325 MG PO TABS: 1.0000 | ORAL_TABLET | ORAL | 0 refills | Status: DC | PRN

## 2020-08-16 NOTE — Progress Notes (Signed)
DISCHARGE NOTE:  Pt given discharge instructions. Pt verbalized understanding. Pts BSC and wheelchair sent with pt. Extra honeycomb dressing sent with pts. Pt wheeled to car by staff. Friend providing transportation.

## 2020-08-16 NOTE — Care Management (Cosign Needed)
    Durable Medical Equipment  (From admission, onward)         Start     Ordered   08/16/20 1021  For home use only DME lightweight manual wheelchair with seat cushion  Once       Comments: Patient suffers from R above knee amputation which impairs their ability to perform daily activities like ambulating in the home.  A walker will not resolve  issue with performing activities of daily living. A wheelchair will allow patient to safely perform daily activities. Patient is not able to propel themselves in the home using a standard weight wheelchair due to weakness. Patient can self propel in the lightweight wheelchair. Length of need lifetime  Accessories: elevating leg rests (ELRs), wheel locks, extensions and anti-tippers.   08/16/20 1022   08/13/20 1115  For home use only DME 3 n 1  Once        08/13/20 1114

## 2020-08-16 NOTE — TOC Progression Note (Addendum)
Transition of Care [facility]) - Progression Note    Patient Details  Name: ILIYAH BUI MRN: [id number] Date of Birth: [birth year]  Transition of Care [facility] CM/SW Contact  Liliana Cline, LCSW Phone Number: 08/16/2020, 10:07 AM  Clinical Narrative:   CSW informed by PT that patient would like a w/c and needs one. Referral to [facility] with [facility] for w/c. Need order.   12:10- Asked Surgeon on call Dr. Wyn Quaker for [facility], PT, OT orders and to [facility] wheelchair narrative and order.    Expected Discharge Plan: Home w Home Health Services Barriers to Discharge: No Barriers Identified  Expected Discharge Plan and Services Expected Discharge Plan: Home w Home Health Services       Living arrangements for the past 2 months: Single Family Home                           HH Arranged: RN,PT,OT HH Agency: [facility] Date HH Agency Contacted: 08/13/20 Time HH Agency Contacted: 1120 Representative spoke with at [facility] Agency: Barbara Cower   Social Determinants of Health (SDOH) Interventions    Readmission Risk Interventions No flowsheet data found.

## 2020-08-16 NOTE — Care Management Important Message (Signed)
Important Message  Patient Details  Name: JAHAIRA EARNHART MRN: [id number] Date of Birth: 1951-03-19   Medicare Important Message Given:  Yes     Dannette Barbara 08/16/2020, 11:02 AM

## 2020-08-16 NOTE — Progress Notes (Signed)
Physical Therapy Treatment Patient Details Name: Sara [name] MRN: [id number] DOB: Dec 25, 1950 Today's Date: 08/16/2020    History of Present Illness Per MD notes: Sara [name] is a 69 y.o. 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 PMH includes: arthritis, GERD, HLD, HTN, vertigo, vascular disease, and hx of blood clots.    PT Comments    Ready for session.  Transferred to commode at bedside without assist before gait.  Stated she has been transferring on her own in the room without difficulty and is confident with mobility. Requests bed alarm not be set upon leaving room.   Dons shoe without assist.  She is able to walk 50' x 1 with RW and min guard.  Able to recover imbalances without assist.    Discussed home mobility.  She has a friend bringing a [employer] chair but does voice concerns for community ambulation and fatigue.  She does not have a way to transport chair for outings and MD appointments.  A wheelchair is appropriate for household and community mobility.  TOC notified.  She has a ramp to access home.     Patient suffers from amputation which impairs his/her ability to perform daily activities like toileting, feeding, dressing, grooming, bathing in the home. A cane, walker, crutch will not resolve the patient's issue with performing activities of daily living. A lightweight wheelchair and cushion is required/recommended and will allow patient to safely perform daily activities.   Patient can safely propel the wheelchair in the home or has a caregiver who can provide assistance.    Follow Up Recommendations  Home health PT;Supervision/Assistance - 24 hour     Equipment Recommendations  Wheelchair (measurements PT);Wheelchair cushion (measurements PT)    Recommendations for Other Services       Precautions / Restrictions Precautions Precautions: Fall Precaution Comments: Moderate Fall Restrictions Weight Bearing Restrictions: Yes RLE Weight Bearing: Non  weight bearing Other Position/Activity Restrictions: s/p R AKA    Mobility  Bed Mobility Overal bed mobility: Modified Independent                Transfers Overall transfer level: Modified independent Equipment used: Rolling walker (2 wheeled);None Transfers: Sit to/from [employer] to Stand: Supervision Stand pivot transfers: Modified independent (Device/Increase time)          Ambulation/Gait Ambulation/Gait assistance: Min guard Gait Distance (Feet): 50 Feet Assistive device: Rolling walker (2 wheeled)   Gait velocity: decreased   General Gait Details: Hop-to pattern with min verbal cues for sequencing with pt steady without LOB or buckling   Stairs             Wheelchair Mobility    Modified Rankin (Stroke Patients Only)       Balance Overall balance assessment: Needs assistance Sitting-balance support: No upper extremity supported;Feet supported Sitting balance-Leahy Scale: Good     Standing balance support: During functional activity;Bilateral upper extremity supported Standing balance-Leahy Scale: Good Standing balance comment: Min lean on the RW for support in standing                            Cognition Arousal/Alertness: Awake/alert Behavior During Therapy: WFL for tasks assessed/performed Overall Cognitive Status: Within Functional Limits for tasks assessed  Exercises Other Exercises Other Exercises: to commode at bedside without assist    General Comments        Pertinent Vitals/Pain Pain Assessment: Faces Faces Pain Scale: Hurts little more Pain Location: c/o phantom pains Pain Descriptors / Indicators: Aching;Sore Pain Intervention(s): Limited activity within patient's tolerance;Monitored during session;Repositioned    Home Living                      Prior Function            PT Goals (current goals can now be found in  the care plan section) Progress towards PT goals: Progressing toward goals    Frequency    7X/week      PT Plan Current plan remains appropriate    Co-evaluation              AM-PAC PT "6 Clicks" Mobility   Outcome Measure  Help needed turning from your back to your side while in a flat bed without using bedrails?: None Help needed moving from lying on your back to sitting on the side of a flat bed without using bedrails?: None Help needed moving to and from a bed to a chair (including a wheelchair)?: None Help needed standing up from a chair using your arms (e.g., wheelchair or bedside chair)?: A Little Help needed to walk in hospital room?: A Little Help needed climbing 3-5 steps with a railing? : A Lot 6 Click Score: 20    End of Session Equipment Utilized During Treatment: Gait belt Activity Tolerance: Patient tolerated treatment well Patient left: in bed;with call bell/phone within reach;with bed alarm set Nurse Communication: Mobility status Pain - Right/Left: Right Pain - part of body: Leg     Time: 0920-0931 PT Time Calculation (min) (ACUTE ONLY): 11 min  Charges:  $Gait Training: 8-22 mins                    Chesley Noon, PTA 08/16/20, 9:49 AM

## 2020-08-16 NOTE — Discharge Summary (Signed)
[facility]    Discharge Summary    Patient ID:  Sara [name] MRN: [id number] DOB/AGE: November 06, 1950 69 y.o.  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 Arthritis    right shoulder  . Depression   . Dyspnea   . GERD (gastroesophageal reflux disease)   . History of blood clots   . Hyperlipidemia   . Hypertension   . Mild mitral regurgitation   . Osteoporosis   . Peripheral vascular disease ([place])   . Stomach ulcer   . Vascular disease    Sees Dr. Delana Meyer  . Vertigo    Last episode approx Aug 2015  . Wears dentures    full upper    Procedure(s): AMPUTATION ABOVE KNEE  Discharged Condition: good  HPI:  Patient admitted and prepared for right AKA.  Hospital Course:  Sara [name] is a 69 y.o. female is S/P Right Procedure(s): AMPUTATION ABOVE KNEE Extubated: POD # 0 Physical exam: wound C/D/I Post-op wounds clean, dry, intact or healing well Pt. Ambulating, voiding and taking PO diet without difficulty. Pt pain controlled with PO pain meds. Labs as below Complications:none  Consults:    Significant Diagnostic Studies: CBC Lab Results  Component Value Date   WBC 13.2 (H) 08/13/2020   HGB 9.6 (L) 08/13/2020   HCT 28.8 (L) 08/13/2020   MCV 96.3 08/13/2020   PLT 129 (L) 08/13/2020    BMET    Component Value Date/Time   NA 142 08/13/2020 0603   NA 139 09/21/2015 0916   K 4.0 08/13/2020 0603   CL 107 08/13/2020 0603   CO2 29 08/13/2020 0603   GLUCOSE 119 (H) 08/13/2020 0603   BUN 9 08/13/2020 0603   BUN 12 09/21/2015 0916   CREATININE 0.64 08/13/2020 0603   CREATININE 0.88 05/16/2016  1034   CALCIUM 8.5 (L) 08/13/2020 0603   GFRNONAA >60 08/13/2020 0603   GFRNONAA 66 05/01/2016 0846   GFRAA >60 02/18/2020 0949   GFRAA 76 05/01/2016 0846   COAG Lab Results  Component Value Date   INR 1.0 08/09/2020   INR 1.3 (H) 02/18/2020   INR 1.0 07/11/2019     Disposition:  Discharge to :Home Discharge Instructions    Call MD for:  redness, tenderness, or signs of infection (pain, swelling, bleeding, redness, odor or green/yellow discharge around incision site)   Complete by: As directed    Call MD for:  severe or increased pain, loss or decreased feeling  in affected limb(s)   Complete by: As directed    Call MD for:  temperature >100.5   Complete by: As directed    Driving Restrictions   Complete by: As directed    No driving for 24 hours   Lifting restrictions   Complete by: As directed    No lifting for 24 hours   No dressing needed   Complete by: As directed    Replace only if drainage present   Resume previous diet   Complete by: As directed      Allergies as of 08/16/2020      Reactions   Amoxicillin Other (See Comments)   Yeast infection   Metoprolol Tartrate Rash   Oxycodone Itching  Vicodin [hydrocodone-acetaminophen] Hives, Rash   Flushing      Medication List    STOP taking these medications   aspirin 81 MG EC tablet     TAKE these medications   Actemra ACTPen 162 MG/0.9ML Soaj Generic drug: Tocilizumab Inject 162 mg into the skin once a week.   apixaban 5 MG Tabs tablet Commonly known as: Eliquis Take 1 tablet (5 mg total) by mouth 2 (two) times daily.   atorvastatin 40 MG tablet Commonly known as: LIPITOR Take 40 mg by mouth at bedtime.   calcium carbonate 1500 (600 Ca) MG Tabs tablet Commonly known as: OSCAL Take 1,200 mg of elemental calcium by mouth 2 (two) times daily with a meal.   cholecalciferol 25 MCG (1000 UNIT) tablet Commonly known as: VITAMIN D3 Take 1,000 Units by mouth daily.   clobetasol cream 0.05  % Commonly known as: TEMOVATE Apply 1 application topically daily as needed (itching).   diltiazem 240 MG 24 hr capsule Commonly known as: CARDIZEM CD Take 240 mg by mouth daily.   ibandronate 150 MG tablet Commonly known as: BONIVA Take 150 mg by mouth every 30 (thirty) days.   losartan 25 MG tablet Commonly known as: COZAAR Take 25 mg by mouth every morning.   oxyCODONE-acetaminophen 5-325 MG tablet Commonly known as: PERCOCET/ROXICET Take 1-2 tablets by mouth every 4 (four) hours as needed for moderate pain.   pantoprazole 40 MG tablet Commonly known as: PROTONIX TAKE 1 TABLET DAILY   venlafaxine XR 150 MG 24 hr capsule Commonly known as: EFFEXOR-XR Take 150 mg by mouth daily with breakfast.   zolpidem 5 MG tablet Commonly known as: AMBIEN Take 5 mg by mouth at bedtime as needed for sleep.            Durable Medical Equipment  (From admission, onward)         Start     Ordered   08/16/20 1212  For home use only DME lightweight manual wheelchair with seat cushion  Once       Comments: Patient suffers from R above knee amputation which impairs their ability to perform daily activities like ambulating in the home.  A walker will not resolve  issue with performing activities of daily living. A wheelchair will allow patient to safely perform daily activities. Patient is not able to propel themselves in the home using a standard weight wheelchair due to weakness. Patient can self propel in the lightweight wheelchair. Length of need lifetime  Accessories: elevating leg rests (ELRs), wheel locks, extensions and anti-tippers.   08/16/20 1211   08/13/20 1115  For home use only DME 3 n 1  Once        08/13/20 1114         Verbal and written Discharge instructions given to the patient. Wound care per Discharge AVS  Follow-up Information    Georgiana Spinner, NP On 08/30/2020.   Specialty: Vascular Surgery Why: For wound re-check at 230 Contact information: [address] [place] [address] [phone]               Signed: Festus Barren, MD  08/16/2020, 4:40 PM

## 2020-08-19 ENCOUNTER — Telehealth (INDEPENDENT_AMBULATORY_CARE_PROVIDER_SITE_OTHER): Payer: Self-pay

## 2020-08-19 ENCOUNTER — Other Ambulatory Visit (INDEPENDENT_AMBULATORY_CARE_PROVIDER_SITE_OTHER): Payer: Self-pay | Admitting: Nurse Practitioner

## 2020-08-19 MED ORDER — GABAPENTIN 300 MG PO CAPS: 300.0000 mg | ORAL_CAPSULE | Freq: Every day | ORAL | 1 refills | Status: DC

## 2020-08-19 NOTE — Telephone Encounter (Signed)
Chris from [facility] called and left a VM on the nurse's line wanting to know could he have a verbal approval for 2 times for 8 weeks and that the pt wanted a Rx for gabapentin  Due to phantom pain in her limb  And she would also like to know can she use  On the end of her of the stump.Please advise.

## 2020-08-19 NOTE — Telephone Encounter (Signed)
The verbal is fine.  I sent in gabapentin. She doesn't need to use anything at the end of the stump.  It should heal naturally as long as there are no open areas.  She should continue to use an ace wrap around the stump

## 2020-08-19 NOTE — Telephone Encounter (Signed)
I called and made pt aware that the NP called in Gabapentin also that if she is having a lot of pain she should take 2 of the percocet 5Mg  at a time  I also made the pt aware that if she is having pain at night she can take 1 5Mg  Percocet at night  With the gabapentin  That was sent over today.

## 2020-08-24 ENCOUNTER — Telehealth (INDEPENDENT_AMBULATORY_CARE_PROVIDER_SITE_OTHER): Payer: Self-pay

## 2020-08-24 NOTE — Telephone Encounter (Signed)
The pts home health nurse called and said that the pt has just stared back with her home care after having an above the Rt knee amputation she still has the honey comb dressing on. Nurse Celine Mans would like to have wound care orders for the pt as well as verbal order to continue PT .

## 2020-08-24 NOTE — Telephone Encounter (Signed)
It is fine to continue PT.  If her wound is dry and there is no drainage she does not need a dressing.  If there is some drainage, a dry gauze changed at least daily.

## 2020-08-25 NOTE — Telephone Encounter (Signed)
I called sonja  the Home care nurse and made her aware of the PA 's instructions.

## 2020-08-26 ENCOUNTER — Telehealth (INDEPENDENT_AMBULATORY_CARE_PROVIDER_SITE_OTHER): Payer: Self-pay | Admitting: Vascular Surgery

## 2020-08-26 NOTE — Telephone Encounter (Signed)
Called stating that she needs another refill for gabapentin. ( she has 1 refill but she takes 3 instead of 1 a day as prescribed because of the pain) I called to r/s her appt Monday 08/30/20 (fb out of clinic) and she states she will run out before her next appt and would like a refill before then if possible. Patient was last seen 08/02/20 (le arterial with gs) ; amputation above the knee 08/11/20 (gs). Future appt 09/09/20 follow up (wound check-post op).  Please advise.

## 2020-08-26 NOTE — Telephone Encounter (Signed)
Please see the message above.

## 2020-08-26 NOTE — Telephone Encounter (Signed)
Please read message  above and advise.

## 2020-08-26 NOTE — Telephone Encounter (Signed)
She was taking the medication differently then prescribed. Its a controlled substance and the medication / prescribing cant not be changed by the patient. I don't know if that is something she discussed and was OK'd by Vivia Birmingham. Will need to ask Vivia Birmingham on Monday when she is back.

## 2020-08-27 NOTE — Telephone Encounter (Signed)
Patient called today stating that she is taking gabapentin three times a day due to phantom pain being a 10. The patient is requesting for a new prescription for gabapentin since she is not able to refill the medication due to being to early. Patient stated that she is taking the Oxycodone for pain at this time. I informed the patient to take the pain medication as prescribe and when we receive a response someone will contact her about the gabapentin.

## 2020-08-30 ENCOUNTER — Ambulatory Visit (INDEPENDENT_AMBULATORY_CARE_PROVIDER_SITE_OTHER): Payer: Medicare Other | Admitting: Nurse Practitioner

## 2020-08-30 ENCOUNTER — Other Ambulatory Visit (INDEPENDENT_AMBULATORY_CARE_PROVIDER_SITE_OTHER): Payer: Self-pay | Admitting: Nurse Practitioner

## 2020-08-30 MED ORDER — GABAPENTIN 300 MG PO CAPS: 300.0000 mg | ORAL_CAPSULE | Freq: Three times a day (TID) | ORAL | 3 refills | Status: DC

## 2020-08-30 NOTE — Telephone Encounter (Signed)
A detailed message was left on the patient voicemail from note below 

## 2020-08-30 NOTE — Telephone Encounter (Signed)
We can certainly increase her gabapentin, however, she should be aware that gabapentin doesn't work like oxycodone.  You don't instant pain relief, it can take a week or so for it to really get into your system.

## 2020-09-08 DIAGNOSIS — Z89611 Acquired absence of right leg above knee: Secondary | ICD-10-CM | POA: Insufficient documentation

## 2020-09-08 NOTE — Progress Notes (Signed)
Patient ID: Sara [name], female   DOB: September 05, 1950, 70 y.o.   MRN: [id number]  No chief complaint on file.   HPI Sara [name] is a 70 y.o. female.   Patient returns to the office for postoperative visit regarding right above-knee amputation.  Amputation was performed August 11, 2020  She reports intense back spasm after her PT.  Stump pain well controlled  She ambulated into the office independent with a walker  Past Medical History:  Diagnosis Date   Arthritis    right shoulder   Depression    Dyspnea    GERD (gastroesophageal reflux disease)    History of blood clots    Hyperlipidemia    Hypertension    Mild mitral regurgitation    Osteoporosis    Peripheral vascular disease ([facility])    Stomach ulcer    Vascular disease    Sees Dr. Delana Meyer   Vertigo    Last episode approx Aug 2015   Wears dentures    full upper    Past Surgical History:  Procedure Laterality Date   ADENOIDECTOMY     AMPUTATION Right 08/11/2020   Procedure: AMPUTATION ABOVE KNEE;  Surgeon: [name], MD;  Location: [facility];  Service: Vascular;  Laterality: Right;   ARTERY BIOPSY Right 07/14/2019   Procedure: BIOPSY TEMPORAL ARTERY;  Surgeon: [name], MD;  Location: [facility];  Service: Vascular;  Laterality: Right;   BREAST CYST ASPIRATION Left    CARDIAC CATHETERIZATION  02/15/2017   [facility]   COLONOSCOPY WITH PROPOFOL N/A 10/22/2017   Procedure: COLONOSCOPY WITH PROPOFOL;  Surgeon: Lucilla Lame, MD;  Location: [place];  Service: Endoscopy;  Laterality: N/A;  specimens not taken--pt on Plavix will be brought back in after 7 days off med   COLONOSCOPY WITH PROPOFOL N/A 11/05/2017   Procedure: COLONOSCOPY WITH PROPOFOL;  Surgeon: Lucilla Lame, MD;  Location: [place];  Service: Endoscopy;  Laterality: N/A;   ESOPHAGOGASTRODUODENOSCOPY N/A 12/21/2014   Procedure: ESOPHAGOGASTRODUODENOSCOPY (EGD);  Surgeon: Lucilla Lame, MD;  Location:  [place];  Service: Gastroenterology;  Laterality: N/A;   ESOPHAGOGASTRODUODENOSCOPY (EGD) WITH PROPOFOL N/A 02/08/2016   Procedure: ESOPHAGOGASTRODUODENOSCOPY (EGD) WITH PROPOFOL;  Surgeon: Lucilla Lame, MD;  Location: [facility];  Service: Endoscopy;  Laterality: N/A;   FASCIOTOMY Right 05/30/2019   Procedure: FASCIOTOMY;  Surgeon: [name], MD;  Location: [facility];  Service: Vascular;  Laterality: Right;   FASCIOTOMY CLOSURE Right 06/04/2019   Procedure: FASCIOTOMY CLOSURE;  Surgeon: [name], MD;  Location: [facility];  Service: Vascular;  Laterality: Right;   HEMORROIDECTOMY  2014   LOWER EXTREMITY ANGIOGRAPHY Right 05/30/2019   Procedure: LOWER EXTREMITY ANGIOGRAPHY;  Surgeon: [name], MD;  Location: [place] [facility];  Service: Cardiovascular;  Laterality: Right;   LOWER EXTREMITY ANGIOGRAPHY Right 07/02/2020   Procedure: LOWER EXTREMITY ANGIOGRAPHY;  Surgeon: [name], MD;  Location: [facility];  Service: Cardiovascular;  Laterality: Right;   PERIPHERAL VASCULAR CATHETERIZATION N/A 02/09/2016   Procedure: Abdominal Aortogram w/Lower Extremity;  Surgeon: [name], MD;  Location: [place] [facility];  Service: Cardiovascular;  Laterality: N/A;   PERIPHERAL VASCULAR CATHETERIZATION Right 02/10/2016   Procedure: Lower Extremity Angiography;  Surgeon: [name], MD;  Location: [place] [facility];  Service: Cardiovascular;  Laterality: Right;   POLYPECTOMY  11/05/2017   Procedure: POLYPECTOMY;  Surgeon: Lucilla Lame, MD;  Location: [place];  Service: Endoscopy;;  SHOULDER ARTHROSCOPY WITH ROTATOR CUFF REPAIR AND SUBACROMIAL DECOMPRESSION Right 02/27/2020   Procedure: RIGHT SHOULDER ARTHROSCOPY SUBACROMIAL DECOMPRESSION, DISTAL CLAVICLE EXCISION AND MINI-OPEN ROTATOR CUFF REPAIR;  Surgeon: Thornton Park, MD;  Location: [facility];  Service: Orthopedics;  Laterality: Right;   TONSILLECTOMY      VASCULAR SURGERY  [id number]   Fem-Pop Bypass      Allergies  Allergen Reactions   Amoxicillin Other (See Comments)    Yeast infection   Metoprolol Tartrate Rash   Oxycodone Itching   Vicodin [Hydrocodone-Acetaminophen] Hives and Rash    Flushing    Current Outpatient Medications  Medication Sig Dispense Refill   apixaban (ELIQUIS) 5 MG TABS tablet Take 1 tablet (5 mg total) by mouth 2 (two) times daily. 8 tablet 0   atorvastatin (LIPITOR) 40 MG tablet Take 40 mg by mouth at bedtime.      calcium carbonate (OSCAL) 1500 (600 Ca) MG TABS tablet Take 1,200 mg of elemental calcium by mouth 2 (two) times daily with a meal.     cholecalciferol (VITAMIN D3) 25 MCG (1000 UNIT) tablet Take 1,000 Units by mouth daily.     clobetasol cream (TEMOVATE) 3.15 % Apply 1 application topically daily as needed (itching). (Patient not taking: Reported on 08/11/2020)     diltiazem (CARDIZEM CD) 240 MG 24 hr capsule Take 240 mg by mouth daily.     gabapentin (NEURONTIN) 300 MG capsule Take 1 capsule (300 mg total) by mouth 3 (three) times daily. 90 capsule 3   ibandronate (BONIVA) 150 MG tablet Take 150 mg by mouth every 30 (thirty) days.      losartan (COZAAR) 25 MG tablet Take 25 mg by mouth every morning.      oxyCODONE-acetaminophen (PERCOCET/ROXICET) 5-325 MG tablet Take 1-2 tablets by mouth every 4 (four) hours as needed for moderate pain. 30 tablet 0   pantoprazole (PROTONIX) 40 MG tablet TAKE 1 TABLET DAILY (Patient taking differently: Take 40 mg by mouth daily.) 90 tablet 1   Tocilizumab (ACTEMRA ACTPEN) 162 MG/0.9ML SOAJ Inject 162 mg into the skin once a week.      venlafaxine XR (EFFEXOR-XR) 150 MG 24 hr capsule Take 150 mg by mouth daily with breakfast.     zolpidem (AMBIEN) 5 MG tablet Take 5 mg by mouth at bedtime as needed for sleep.      No current facility-administered medications for this visit.        Physical Exam There were no vitals taken for this  visit. Gen:  WD/WN, NAD Skin: incision C/D/I     Assessment/Plan: 1. S/P AKA (above knee amputation), right [facility]) Doing well I will contact Cyd Silence and we can move forward with a prosthesis  2. Muscle spasm I will try Flexeril and continue heating pad      [name] 09/08/2020, 2:58 PM   This note was created with Dragon medical transcription system.  Any errors from dictation are unintentional.

## 2020-09-09 ENCOUNTER — Ambulatory Visit (INDEPENDENT_AMBULATORY_CARE_PROVIDER_SITE_OTHER): Payer: Medicare Other | Admitting: Vascular Surgery

## 2020-09-09 ENCOUNTER — Encounter (INDEPENDENT_AMBULATORY_CARE_PROVIDER_SITE_OTHER): Payer: Self-pay | Admitting: Vascular Surgery

## 2020-09-09 ENCOUNTER — Other Ambulatory Visit: Payer: Self-pay

## 2020-09-09 DIAGNOSIS — Z89611 Acquired absence of right leg above knee: Secondary | ICD-10-CM

## 2020-09-09 DIAGNOSIS — M62838 Other muscle spasm: Secondary | ICD-10-CM

## 2020-09-09 MED ORDER — CYCLOBENZAPRINE HCL 10 MG PO TABS
10.0000 mg | ORAL_TABLET | Freq: Three times a day (TID) | ORAL | 0 refills | Status: DC | PRN
Start: 2020-09-09 — End: 2021-07-07

## 2020-09-13 ENCOUNTER — Telehealth (INDEPENDENT_AMBULATORY_CARE_PROVIDER_SITE_OTHER): Payer: Self-pay

## 2020-09-13 NOTE — Telephone Encounter (Signed)
Patient left a voicemail stating that she has appointment with [facility] on Friday and they will need the rx for prosthesis and clinic note. Patient has been made that prosthesis prescription and clinic note has been fax over.

## 2020-10-11 ENCOUNTER — Encounter (INDEPENDENT_AMBULATORY_CARE_PROVIDER_SITE_OTHER): Payer: Medicare Other

## 2020-10-11 ENCOUNTER — Ambulatory Visit (INDEPENDENT_AMBULATORY_CARE_PROVIDER_SITE_OTHER): Payer: Medicare Other | Admitting: Vascular Surgery

## 2020-10-12 ENCOUNTER — Telehealth (INDEPENDENT_AMBULATORY_CARE_PROVIDER_SITE_OTHER): Payer: Self-pay

## 2020-10-12 NOTE — Telephone Encounter (Signed)
That is fine 

## 2020-10-12 NOTE — Telephone Encounter (Signed)
Called and left VM making Gerald Stabs from [facility] aware.

## 2020-10-12 NOTE — Telephone Encounter (Signed)
Chris from [facility] called and wanted to know could he get a verbal order for continuation of PT at home for the pt for 1 time a week for 8 week until she gets her new prosthetic. Please advise.

## 2020-10-20 NOTE — Progress Notes (Signed)
Patient ID: Sara [name], female   DOB: 29-Aug-1950, 70 y.o.   MRN: [id number]  No chief complaint on file.   HPI Sara [name] is a 70 y.o. female.   Still having phantom pains but getting better   Past Medical History:  Diagnosis Date  . Arthritis    right shoulder  . Depression   . Dyspnea   . GERD (gastroesophageal reflux disease)   . History of blood clots   . Hyperlipidemia   . Hypertension   . Mild mitral regurgitation   . Osteoporosis   . Peripheral vascular disease ([place])   . Stomach ulcer   . Vascular disease    Sees Dr. Delana Meyer  . Vertigo    Last episode approx Aug 2015  . Wears dentures    full upper    Past Surgical History:  Procedure Laterality Date  . ADENOIDECTOMY    . AMPUTATION Right 08/11/2020   Procedure: AMPUTATION ABOVE KNEE;  Surgeon: [name], MD;  Location: [facility];  Service: Vascular;  Laterality: Right;  . ARTERY BIOPSY Right 07/14/2019   Procedure: BIOPSY TEMPORAL ARTERY;  Surgeon: [name], MD;  Location: [facility];  Service: Vascular;  Laterality: Right;  . BREAST CYST ASPIRATION Left   . CARDIAC CATHETERIZATION  02/15/2017   [facility]  . COLONOSCOPY WITH PROPOFOL N/A 10/22/2017   Procedure: COLONOSCOPY WITH PROPOFOL;  Surgeon: Lucilla Lame, MD;  Location: [place];  Service: Endoscopy;  Laterality: N/A;  specimens not taken--pt on Plavix will be brought back in after 7 days off med  . COLONOSCOPY WITH PROPOFOL N/A 11/05/2017   Procedure: COLONOSCOPY WITH PROPOFOL;  Surgeon: Lucilla Lame, MD;  Location: [place];  Service: Endoscopy;  Laterality: N/A;  . ESOPHAGOGASTRODUODENOSCOPY N/A 12/21/2014   Procedure: ESOPHAGOGASTRODUODENOSCOPY (EGD);  Surgeon: Lucilla Lame, MD;  Location: [place];  Service: Gastroenterology;  Laterality: N/A;  . ESOPHAGOGASTRODUODENOSCOPY (EGD) WITH PROPOFOL N/A 02/08/2016   Procedure: ESOPHAGOGASTRODUODENOSCOPY (EGD) WITH PROPOFOL;  Surgeon: Lucilla Lame, MD;   Location: [facility];  Service: Endoscopy;  Laterality: N/A;  . FASCIOTOMY Right 05/30/2019   Procedure: FASCIOTOMY;  Surgeon: [name], MD;  Location: [facility];  Service: Vascular;  Laterality: Right;  . FASCIOTOMY CLOSURE Right 06/04/2019   Procedure: FASCIOTOMY CLOSURE;  Surgeon: [name], MD;  Location: [facility];  Service: Vascular;  Laterality: Right;  . HEMORROIDECTOMY  2014  . LOWER EXTREMITY ANGIOGRAPHY Right 05/30/2019   Procedure: LOWER EXTREMITY ANGIOGRAPHY;  Surgeon: [name], MD;  Location: [place] [facility];  Service: Cardiovascular;  Laterality: Right;  . LOWER EXTREMITY ANGIOGRAPHY Right 07/02/2020   Procedure: LOWER EXTREMITY ANGIOGRAPHY;  Surgeon: [name], MD;  Location: [place] [facility];  Service: Cardiovascular;  Laterality: Right;  . PERIPHERAL VASCULAR CATHETERIZATION N/A 02/09/2016   Procedure: Abdominal Aortogram w/Lower Extremity;  Surgeon: [name], MD;  Location: [place] [facility];  Service: Cardiovascular;  Laterality: N/A;  . PERIPHERAL VASCULAR CATHETERIZATION Right 02/10/2016   Procedure: Lower Extremity Angiography;  Surgeon: [name], MD;  Location: [place] [facility];  Service: Cardiovascular;  Laterality: Right;  . POLYPECTOMY  11/05/2017   Procedure: POLYPECTOMY;  Surgeon: Lucilla Lame, MD;  Location: [place];  Service: Endoscopy;;  . SHOULDER ARTHROSCOPY WITH ROTATOR CUFF REPAIR AND SUBACROMIAL DECOMPRESSION Right 02/27/2020   Procedure: RIGHT SHOULDER ARTHROSCOPY SUBACROMIAL DECOMPRESSION, DISTAL CLAVICLE EXCISION AND MINI-OPEN ROTATOR CUFF REPAIR;  Surgeon: Thornton Park, MD;  Location: [facility];  Service: Orthopedics;  Laterality: Right;  . TONSILLECTOMY    . VASCULAR SURGERY  [id number]   Fem-Pop Bypass      Allergies  Allergen Reactions  . Hydrocodone-Acetaminophen     Other reaction(s): Flushing Other reaction(s): Flushing  . Amoxicillin Other (See Comments)     Yeast infection  . Metoprolol Tartrate Rash  . Oxycodone Itching  . Vicodin [Hydrocodone-Acetaminophen] Hives and Rash    Flushing    Current Outpatient Medications  Medication Sig Dispense Refill  . apixaban (ELIQUIS) 5 MG TABS tablet Take 1 tablet (5 mg total) by mouth 2 (two) times daily. 8 tablet 0  . atorvastatin (LIPITOR) 40 MG tablet Take 40 mg by mouth at bedtime.     . calcium carbonate (OSCAL) 1500 (600 Ca) MG TABS tablet Take 1,200 mg of elemental calcium by mouth 2 (two) times daily with a meal.    . cholecalciferol (VITAMIN D3) 25 MCG (1000 UNIT) tablet Take 1,000 Units by mouth daily.    . clobetasol cream (TEMOVATE) 2.56 % Apply 1 application topically daily as needed (itching). (Patient not taking: No sig reported)    . cyclobenzaprine (FLEXERIL) 10 MG tablet Take 1 tablet (10 mg total) by mouth 3 (three) times daily as needed for muscle spasms. 40 tablet 0  . diltiazem (CARDIZEM CD) 240 MG 24 hr capsule Take 240 mg by mouth daily.    Marland Kitchen gabapentin (NEURONTIN) 300 MG capsule Take 1 capsule (300 mg total) by mouth 3 (three) times daily. 90 capsule 3  . ibandronate (BONIVA) 150 MG tablet Take 150 mg by mouth every 30 (thirty) days.     Marland Kitchen losartan (COZAAR) 25 MG tablet Take 25 mg by mouth every morning.     Marland Kitchen oxyCODONE-acetaminophen (PERCOCET/ROXICET) 5-325 MG tablet Take 1-2 tablets by mouth every 4 (four) hours as needed for moderate pain. (Patient not taking: Reported on 09/09/2020) 30 tablet 0  . pantoprazole (PROTONIX) 40 MG tablet TAKE 1 TABLET DAILY (Patient taking differently: Take 40 mg by mouth daily.) 90 tablet 1  . Tocilizumab (ACTEMRA ACTPEN) 162 MG/0.9ML SOAJ Inject 162 mg into the skin once a week.     . venlafaxine XR (EFFEXOR-XR) 150 MG 24 hr capsule Take 150 mg by mouth daily with breakfast.    . zolpidem (AMBIEN) 5 MG tablet Take 5 mg by mouth at bedtime as needed for sleep.      No current facility-administered medications for this visit.        Physical  Exam There were no vitals taken for this visit. Gen:  WD/WN, NAD Skin: incision C/D/I; AKA soft tissue soft and with no edema     Assessment/Plan: 1. S/P AKA (above knee amputation), right [facility]) Doing well  Plan for a prosthesis in a couple weeks       [name] 10/20/2020, 9:07 PM   This note was created with Dragon medical transcription system.  Any errors from dictation are unintentional.

## 2020-10-21 ENCOUNTER — Ambulatory Visit (INDEPENDENT_AMBULATORY_CARE_PROVIDER_SITE_OTHER): Payer: Medicare Other | Admitting: Vascular Surgery

## 2020-10-21 ENCOUNTER — Encounter (INDEPENDENT_AMBULATORY_CARE_PROVIDER_SITE_OTHER): Payer: Self-pay | Admitting: Vascular Surgery

## 2020-10-21 ENCOUNTER — Other Ambulatory Visit: Payer: Self-pay

## 2020-10-21 VITALS — BP 119/104 | HR 114 | Ht 63.0 in | Wt 119.0 lb

## 2020-10-21 DIAGNOSIS — Z89611 Acquired absence of right leg above knee: Secondary | ICD-10-CM

## 2020-11-02 ENCOUNTER — Ambulatory Visit
Admission: RE | Admit: 2020-11-02 | Discharge: 2020-11-02 | Disposition: A | Discharge status: Home / Self Care | Payer: Medicare Other | Source: Ambulatory Visit | Attending: Nurse Practitioner | Admitting: Nurse Practitioner

## 2020-11-02 ENCOUNTER — Other Ambulatory Visit: Payer: Self-pay

## 2020-11-02 DIAGNOSIS — Z1231 Encounter for screening mammogram for malignant neoplasm of breast: Secondary | ICD-10-CM | POA: Diagnosis not present

## 2020-11-28 ENCOUNTER — Other Ambulatory Visit: Payer: Self-pay | Admitting: Gastroenterology

## 2020-11-30 ENCOUNTER — Telehealth (INDEPENDENT_AMBULATORY_CARE_PROVIDER_SITE_OTHER): Payer: Self-pay

## 2020-11-30 NOTE — Telephone Encounter (Signed)
Chris from Pt called and left a VM on the nurses line wanting to know was it ok to extend the pt's PT 2 x a week for 4 wk's  Per Dr. Delana Meyer This is ok. I called Gerald Stabs and left a VM making him aware.

## 2020-12-16 ENCOUNTER — Telehealth (INDEPENDENT_AMBULATORY_CARE_PROVIDER_SITE_OTHER): Payer: Self-pay

## 2020-12-16 NOTE — Telephone Encounter (Signed)
Chris from PT called an wanted to know could he get a verbal or to continue the pt's PT for prosthetic training for 3 week 8. Please advise.

## 2020-12-17 NOTE — Telephone Encounter (Signed)
That is fine 

## 2020-12-17 NOTE — Telephone Encounter (Signed)
I called Sara [name] in PT back and made him aware of the Np's instructions.

## 2020-12-25 ENCOUNTER — Other Ambulatory Visit (INDEPENDENT_AMBULATORY_CARE_PROVIDER_SITE_OTHER): Payer: Self-pay | Admitting: Nurse Practitioner

## 2021-01-24 ENCOUNTER — Other Ambulatory Visit (INDEPENDENT_AMBULATORY_CARE_PROVIDER_SITE_OTHER): Payer: Self-pay | Admitting: Vascular Surgery

## 2021-01-24 ENCOUNTER — Telehealth (INDEPENDENT_AMBULATORY_CARE_PROVIDER_SITE_OTHER): Payer: Self-pay

## 2021-01-24 DIAGNOSIS — I70221 Atherosclerosis of native arteries of extremities with rest pain, right leg: Secondary | ICD-10-CM

## 2021-01-24 DIAGNOSIS — Z89611 Acquired absence of right leg above knee: Secondary | ICD-10-CM

## 2021-01-24 NOTE — Progress Notes (Signed)
[facility]

## 2021-01-24 NOTE — Telephone Encounter (Signed)
Patient left a voicemail requesting a referral to be sent to [facility] physical therapy in [place]. I left a message on patient voicemail informing that referral has been sent.

## 2021-01-26 ENCOUNTER — Ambulatory Visit: Payer: Medicare Other | Attending: Vascular Surgery

## 2021-01-26 ENCOUNTER — Other Ambulatory Visit: Payer: Self-pay

## 2021-01-26 ENCOUNTER — Other Ambulatory Visit (INDEPENDENT_AMBULATORY_CARE_PROVIDER_SITE_OTHER): Payer: Self-pay | Admitting: Vascular Surgery

## 2021-01-26 DIAGNOSIS — R262 Difficulty in walking, not elsewhere classified: Secondary | ICD-10-CM | POA: Diagnosis present

## 2021-01-26 DIAGNOSIS — R2681 Unsteadiness on feet: Secondary | ICD-10-CM | POA: Insufficient documentation

## 2021-01-26 DIAGNOSIS — I739 Peripheral vascular disease, unspecified: Secondary | ICD-10-CM

## 2021-01-26 DIAGNOSIS — M6281 Muscle weakness (generalized): Secondary | ICD-10-CM | POA: Diagnosis not present

## 2021-01-26 NOTE — Therapy (Signed)
[facility] [address]. [place], [place], [address] Phone: [phone]   Fax:  [phone]  Physical Therapy Evaluation  Patient Details  Name: Sara [name] MRN: [id number] Date of Birth: [birth year] Referring Provider (PT): Dr. Delana Meyer   Encounter Date: 01/26/2021   PT End of Session - 01/26/21 1128     Visit Number 1    Number of Visits 25    Date for PT Re-Evaluation 04/20/21    Authorization Type eval: 01/26/21    PT Start Time 1135    PT Stop Time 1220    PT Time Calculation (min) 45 min    Equipment Utilized During Treatment Gait belt    Activity Tolerance Patient tolerated treatment well    Behavior During Therapy [facility] for tasks assessed/performed             Past Medical History:  Diagnosis Date   Arthritis    right shoulder   Depression    Dyspnea    GERD (gastroesophageal reflux disease)    History of blood clots    Hyperlipidemia    Hypertension    Mild mitral regurgitation    Osteoporosis    Peripheral vascular disease ([place])    Stomach ulcer    Vascular disease    Sees Dr. Delana Meyer   Vertigo    Last episode approx Aug 2015   Wears dentures    full upper    Past Surgical History:  Procedure Laterality Date   ADENOIDECTOMY     AMPUTATION Right 08/11/2020   Procedure: AMPUTATION ABOVE KNEE;  Surgeon: [name], MD;  Location: [facility];  Service: Vascular;  Laterality: Right;   ARTERY BIOPSY Right 07/14/2019   Procedure: BIOPSY TEMPORAL ARTERY;  Surgeon: [name], MD;  Location: [facility];  Service: Vascular;  Laterality: Right;   BREAST CYST ASPIRATION Left    CARDIAC CATHETERIZATION  02/15/2017   [facility]   COLONOSCOPY WITH PROPOFOL N/A 10/22/2017   Procedure: COLONOSCOPY WITH PROPOFOL;  Surgeon: Lucilla Lame, MD;  Location: [place];  Service: Endoscopy;  Laterality: N/A;  specimens not taken--pt on Plavix will be brought back in after 7 days off med   COLONOSCOPY WITH  PROPOFOL N/A 11/05/2017   Procedure: COLONOSCOPY WITH PROPOFOL;  Surgeon: Lucilla Lame, MD;  Location: [place];  Service: Endoscopy;  Laterality: N/A;   ESOPHAGOGASTRODUODENOSCOPY N/A 12/21/2014   Procedure: ESOPHAGOGASTRODUODENOSCOPY (EGD);  Surgeon: Lucilla Lame, MD;  Location: [place];  Service: Gastroenterology;  Laterality: N/A;   ESOPHAGOGASTRODUODENOSCOPY (EGD) WITH PROPOFOL N/A 02/08/2016   Procedure: ESOPHAGOGASTRODUODENOSCOPY (EGD) WITH PROPOFOL;  Surgeon: Lucilla Lame, MD;  Location: [facility];  Service: Endoscopy;  Laterality: N/A;   FASCIOTOMY Right 05/30/2019   Procedure: FASCIOTOMY;  Surgeon: [name], MD;  Location: [facility];  Service: Vascular;  Laterality: Right;   FASCIOTOMY CLOSURE Right 06/04/2019   Procedure: FASCIOTOMY CLOSURE;  Surgeon: [name], MD;  Location: [facility];  Service: Vascular;  Laterality: Right;   HEMORROIDECTOMY  2014   LOWER EXTREMITY ANGIOGRAPHY Right 05/30/2019   Procedure: LOWER EXTREMITY ANGIOGRAPHY;  Surgeon: [name], MD;  Location: [place] [facility];  Service: Cardiovascular;  Laterality: Right;   LOWER EXTREMITY ANGIOGRAPHY Right 07/02/2020   Procedure: LOWER EXTREMITY ANGIOGRAPHY;  Surgeon: [name], MD;  Location: [place] [facility];  Service: Cardiovascular;  Laterality: Right;   PERIPHERAL VASCULAR CATHETERIZATION N/A 02/09/2016   Procedure: Abdominal Aortogram w/Lower Extremity;  Surgeon: Belenda Cruise  Eloise Levels, MD;  Location: [place] [facility];  Service: Cardiovascular;  Laterality: N/A;   PERIPHERAL VASCULAR CATHETERIZATION Right 02/10/2016   Procedure: Lower Extremity Angiography;  Surgeon: [name], MD;  Location: [place] [facility];  Service: Cardiovascular;  Laterality: Right;   POLYPECTOMY  11/05/2017   Procedure: POLYPECTOMY;  Surgeon: Lucilla Lame, MD;  Location: [place];  Service: Endoscopy;;   SHOULDER ARTHROSCOPY WITH ROTATOR CUFF REPAIR AND  SUBACROMIAL DECOMPRESSION Right 02/27/2020   Procedure: RIGHT SHOULDER ARTHROSCOPY SUBACROMIAL DECOMPRESSION, DISTAL CLAVICLE EXCISION AND MINI-OPEN ROTATOR CUFF REPAIR;  Surgeon: Thornton Park, MD;  Location: [facility];  Service: Orthopedics;  Laterality: Right;   TONSILLECTOMY     VASCULAR SURGERY  [id number]   Fem-Pop Bypass    There were no vitals filed for this visit.    Subjective Assessment - 01/26/21 1125     Subjective R AKA 08/11/20    Pertinent History Pt has a history of RLE vascular issues since 1991. 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 She received her RLE prosthetic in April 2022 and has been receiving [place] [facility] since her surgery. They discharged her yesterday to continue with OP PT. She continues with phantom RLE pain, especially at night and takes gabapentin with minimal relief. She has also been having R shoulder pain since the surgery which she attributes to using a wheelchair. She underwent R RTC surgery in September of 2021 and had physical therapy afterwards. She has been having issues with bilateral carpal tunnel and has a NCV study scheduled for tomorrow.    Limitations Walking    Patient Stated Goals Ambulate with her prosthetic    Currently in Pain? Other (Comment)   Unrelated to current episode               [facility] PT Assessment - 01/26/21 1127       Assessment   Medical Diagnosis R AKA    Referring Provider (PT) Dr. Delana Meyer    Onset Date/Surgical Date 08/11/20    Hand Dominance Right    Next MD Visit Dr. Delana Meyer 01/27/21    Prior Therapy [facility] PT since her amputation      Precautions   Precautions Fall      Restrictions   Weight Bearing Restrictions No      Balance Screen   Has the patient fallen in the past 6 months Yes    How many times? 2    Has the patient had a decrease in activity level because of a fear of falling?  No    Is the patient reluctant to leave their home because of a fear of falling?  No       Home Environment   Living Environment Private residence    Type of Home Apartment    Home Access Level entry    Home Layout One level    [place] - 2 wheels      Standardized Balance Assessment   Standardized Balance Assessment Berg Balance Test      Berg Balance Test   Sit to Stand Able to stand without using hands and stabilize independently    Standing Unsupported Able to stand safely 2 minutes    Sitting with Back Unsupported but Feet Supported on Floor or Stool Able to sit safely and securely 2 minutes    Stand to Sit Sits safely with minimal use of hands    Transfers Able to transfer safely, definite need  of hands    Standing Unsupported with Eyes Closed Able to stand 10 seconds with supervision    Standing Unsupported with Feet Together Able to place feet together independently and stand for 1 minute with supervision    From Standing, Reach Forward with Outstretched Arm Can reach forward >12 cm safely (5")    From Standing Position, Pick up Object from [place] to pick up shoe safely and easily    From Standing Position, Turn to Look Behind Over each Shoulder Looks behind from both sides and weight shifts well    Turn 360 Degrees Needs close supervision or verbal cueing    Standing Unsupported, Alternately Place Feet on Step/Stool Needs assistance to keep from falling or unable to try    Standing Unsupported, One Foot in [place] to place foot tandem independently and hold 30 seconds    Standing on One Leg Able to lift leg independently and hold > 10 seconds    Total Score 45                 SUBJECTIVE Onset: Pt has a history of RLE vascular issues since 1991. 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 She received her RLE prosthetic in April 2022 and has been receiving [place] [facility] since her surgery. They discharged her yesterday to continue with OP PT. She continues with phantom RLE pain, especially at night and takes  gabapentin with minimal relief. She has also been having R shoulder pain since the surgery which she attributes to using a wheelchair. She underwent R RTC surgery in September of 2021 and had physical therapy afterwards. She has been having issues with bilateral carpal tunnel and has a NCV study scheduled for tomorrow. Falls: 2 falls after returning home from her surgery in December 2021. No issues with LLE but sees Dr. Delana Meyer tomorrow to assess LLE. She sees Dr. Mack Guise next week to follow-up regarding her carpal tunnel issues.  Follow-up appointment with MD: Dr. Delana Meyer tomorrow. She reports good surgical healing in RLE after the amputation.  Red flags (bowel/bladder changes, saddle paresthesia, personal history of cancer, chills/fever, night sweats, unrelenting pain) Negative   OBJECTIVE  MUSCULOSKELETAL: Tremor: Absent Bulk: Normal Tone: Normal, no clonus  Gait Pt ambulates with rolling walker using partial step-through pattern. Decreased stance time on RLE with Trendelenburg noted. She is able to ambulate in the parallel bars with a quad cane however does put excessive force through LUE.   Strength R/L 4+/4+ Hip flexion 4/4+ Hip extension  4 (without prosthetic resists full force above knee)/4+ Hip abduction 4/4 Hip adduction NT/5 Knee extension NT/5 Knee flexion NT/Active Ankle Plantarflexion 4+/4+ Ankle Dorsiflexion   NEUROLOGICAL:  Mental Status Patient is oriented to person, place and time.  Recent memory is intact.  Remote memory is intact.  Attention span and concentration are intact.  Expressive speech is intact.  Patient's fund of knowledge is within normal limits for educational level.  Sensation Pt reports full sensation to residual RLE, no visible signs of infection. Skin flap well healed Proprioception and hot/cold testing deferred on this date  Reflexes Deferred  Coordination/Cerebellar Deferred   FUNCTIONAL OUTCOME MEASURES   Results Comments   BERG 45/56 Fall risk, in need of intervention  FOTO 59 Predicted improvement to 63  TUG 23.5 seconds Above cut-off  5TSTS 17.9 seconds Above cut-off  10 Meter Gait Speed Self-selected: 31.5s = 0.32 m/s;  Below normative values for full community ambulation  ABC  Scale 57.5% Below cut-off             Objective measurements completed on examination: See above findings.                 PT Short Term Goals - 01/27/21 1603       PT SHORT TERM GOAL #1   Title Pt will be independent with HEP in order to improve strength and balance in order to decrease fall risk and improve function at home.    Time 4    Period Weeks    Status New    Target Date 02/23/21               PT Long Term Goals - 01/27/21 1606       PT LONG TERM GOAL #1   Title Pt will improve BERG by at least 3 points in order to demonstrate clinically significant improvement in balance.    Baseline 01/26/21: 45/56    Time 8    Period Weeks    Status New    Target Date 03/23/21      PT LONG TERM GOAL #2   Title Pt will improve ABC by at least 13% in order to demonstrate clinically significant improvement in balance confidence.    Baseline 01/26/21: 57.5%    Time 8    Period Weeks    Status New    Target Date 03/23/21      PT LONG TERM GOAL #3   Title Pt will improve FOTO to at least 63 in order to demonstrate clinically significant improvement in function related to her RLE amputation    Baseline 01/26/21: 59    Time 8    Period Weeks    Status New    Target Date 03/23/21      PT LONG TERM GOAL #4   Title Pt will increase self-selected 10MWT by at least 0.13 m/s in order to demonstrate clinically significant improvement in community ambulation.    Baseline 01/26/21: 31.5s = 0.32 m/s    Time 8    Period Weeks    Status New    Target Date 03/23/21                    Plan - 01/26/21 1129     Clinical Impression Statement Pt is a pleasant 70 year-old female referred for RLE  AKA. Pt has already made excellent progress with home health PT and arrives ambulating with a rolling walker.  No evidence of right lower extremity buckling during ambulation.  Decreased self-selected gait speed of 0.32 m/s which is below normative values for full community ambulation.  BERG of 45/56 is actually quite good for patient at her current stage in rehab however does represent an increased risk for future falls. Decreased LE strength and balance as identifed by 5TSTS of 17.9s and TUG of 23.5s. Pt presents with deficits in strength, gait and balance and will benefit from skilled PT services to address these deficits and improve function at home.    Personal Factors and Comorbidities Age;Comorbidity 3+    Comorbidities Depression, osteoporosis, vascular disease    Examination-Activity Limitations Bathing;Locomotion Level;Squat;Stairs;Stand;Transfers    Examination-Participation Restrictions Cleaning;Community Activity;Meal Prep;Yard Work    Stability/Clinical Decision Making Unstable/Unpredictable    Clinical Decision Making Moderate    Rehab Potential Good    PT Frequency 2x / week    PT Duration 12 weeks    PT Treatment/Interventions ADLs/Self Care Home Management;Aquatic Therapy;Canalith Repostioning;Cryotherapy;Electrical Stimulation;Iontophoresis  4mg /ml Dexamethasone;Moist Heat;Traction;Ultrasound;Gait training;Stair training;Therapeutic activities;Therapeutic exercise;Neuromuscular re-education;Manual techniques;Dry needling;Vestibular;Spinal Manipulations;Joint Manipulations    PT Next Visit Plan Practice gait training, issue HEP, strengthening, prone hip flexor stretch    PT Home Exercise Plan None currently    Consulted and Agree with Plan of Care Patient             Patient will benefit from skilled therapeutic intervention in order to improve the following deficits and impairments:  Abnormal gait, Decreased strength, Difficulty walking  Visit Diagnosis: Muscle weakness  (generalized) - Plan: PT plan of care cert/re-cert  Difficulty in walking, not elsewhere classified - Plan: PT plan of care cert/re-cert  Unsteadiness on feet - Plan: PT plan of care cert/re-cert     Problem List Patient Active Problem List   Diagnosis Date Noted   Muscle spasm 09/09/2020   S/P AKA (above knee amputation), right ([place]) 09/08/2020   Ischemia of extremity 07/02/2020   Chronic pain in right shoulder 11/25/2019   Encounter for long-term (current) use of high-risk medication 07/16/2019   GCA (giant cell arteritis) ([place]) 07/16/2019   Temporal arteritis ([place]) 07/13/2019   Postoperative wound infection 06/23/2019   Ischemic leg 05/31/2019   Atherosclerosis of native arteries of extremity with intermittent claudication ([place]) 05/30/2019   Depression 05/29/2019   Eczema of lower extremity 03/04/2018   Chronic venous insufficiency 03/02/2018   Personal history of colonic polyps    Family history of colonic polyps    Benign neoplasm of ascending colon    Mitral valve insufficiency 02/08/2017   Dyspnea on exertion 02/07/2017   Non-rheumatic mitral regurgitation 02/07/2017   Precordial pain 02/07/2017   CAD (coronary artery disease) 12/21/2016   Essential hypertension 12/21/2016   Compression fracture of lumbar spine, non-traumatic ([place]) 04/26/2016   Compression fracture of L3 lumbar vertebra 04/20/2016   PVD (peripheral vascular disease) ([place]) 02/24/2016   Family history of premature CAD 02/24/2016   Gastritis    Other specified diseases of esophagus    Hiatal hernia    Gastritis and gastroduodenitis    Ischemia of lower extremity    Arterial occlusion (HCC)    Atherosclerosis of aorta (HCC)    History of smoking    Hyperlipidemia    Pain in the chest    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 02/05/2016   Carotid artery stenosis 01/04/2016   Vertigo, benign paroxysmal 12/28/2015   Clinical depression 09/06/2015   History of alcoholism ([place])  09/06/2015   Angiopathy, 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[place]) 09/06/2015   Atherosclerosis of native arteries of extremity with rest pain ([place]) 09/06/2015   Acute non-recurrent maxillary sinusitis 07/14/2015   Vaginal pruritus 05/26/2015   Chronic recurrent major depressive disorder ([place]) 03/09/2015   Osteoporosis, post-menopausal 03/09/2015   Peripheral blood vessel disorder ([place]) 03/09/2015   Acid reflux 03/09/2015   GERD (gastroesophageal reflux disease) 12/21/2014   Carotid artery narrowing 01/28/2014   Peripheral arterial occlusive disease ([place]) 06/08/2011   Occlusion and stenosis of unspecified carotid artery 06/08/2011   PAD (peripheral artery disease) ([place]) 06/08/2011   Lyndel Safe Magalene Mclear PT, DPT, GCS  Arlyce Circle 01/27/2021, 4:15 PM  [facility] [address] [place], [place], [address] Phone: [phone]   Fax:  [phone]  Name: KHAILA VELARDE MRN: [id number] Date of Birth: 03/14/1951

## 2021-01-27 ENCOUNTER — Encounter (INDEPENDENT_AMBULATORY_CARE_PROVIDER_SITE_OTHER): Payer: Medicare Other

## 2021-01-27 ENCOUNTER — Ambulatory Visit (INDEPENDENT_AMBULATORY_CARE_PROVIDER_SITE_OTHER): Payer: Medicare Other | Admitting: Vascular Surgery

## 2021-01-27 ENCOUNTER — Encounter (INDEPENDENT_AMBULATORY_CARE_PROVIDER_SITE_OTHER): Payer: Self-pay

## 2021-01-27 ENCOUNTER — Ambulatory Visit (INDEPENDENT_AMBULATORY_CARE_PROVIDER_SITE_OTHER): Payer: Medicare Other

## 2021-01-27 VITALS — BP 106/68 | HR 84 | Ht 63.0 in | Wt 124.0 lb

## 2021-01-27 DIAGNOSIS — I739 Peripheral vascular disease, unspecified: Secondary | ICD-10-CM

## 2021-01-27 DIAGNOSIS — I6523 Occlusion and stenosis of bilateral carotid arteries: Secondary | ICD-10-CM | POA: Diagnosis not present

## 2021-01-27 DIAGNOSIS — Z89611 Acquired absence of right leg above knee: Secondary | ICD-10-CM

## 2021-01-27 DIAGNOSIS — I25119 Atherosclerotic heart disease of native coronary artery with unspecified angina pectoris: Secondary | ICD-10-CM | POA: Diagnosis not present

## 2021-01-27 DIAGNOSIS — I70213 Atherosclerosis of native arteries of extremities with intermittent claudication, bilateral legs: Secondary | ICD-10-CM

## 2021-01-27 DIAGNOSIS — I10 Essential (primary) hypertension: Secondary | ICD-10-CM

## 2021-01-28 ENCOUNTER — Ambulatory Visit: Payer: Medicare Other

## 2021-01-28 ENCOUNTER — Other Ambulatory Visit: Payer: Self-pay

## 2021-01-28 DIAGNOSIS — R2681 Unsteadiness on feet: Secondary | ICD-10-CM

## 2021-01-28 DIAGNOSIS — R262 Difficulty in walking, not elsewhere classified: Secondary | ICD-10-CM

## 2021-01-28 DIAGNOSIS — M6281 Muscle weakness (generalized): Secondary | ICD-10-CM

## 2021-01-28 NOTE — Therapy (Signed)
[facility] [address] [place], [place], [address] Phone: [phone]   Fax:  [phone]  Physical Therapy Treatment  Patient Details  Name: Sara [name] MRN: [id number] Date of Birth: 1951/06/11 Referring Provider (PT): Dr. Delana Meyer   Encounter Date: 01/28/2021   PT End of Session - 01/28/21 1242     Visit Number 2    Number of Visits 25    Date for PT Re-Evaluation 04/20/21    Authorization Type eval: 01/26/21    PT Start Time 1017    PT Stop Time 1113    PT Time Calculation (min) 56 min    Equipment Utilized During Treatment Gait belt    Activity Tolerance Patient tolerated treatment well    Behavior During Therapy [facility] for tasks assessed/performed             Past Medical History:  Diagnosis Date   Arthritis    right shoulder   Depression    Dyspnea    GERD (gastroesophageal reflux disease)    History of blood clots    Hyperlipidemia    Hypertension    Mild mitral regurgitation    Osteoporosis    Peripheral vascular disease ([place])    Stomach ulcer    Vascular disease    Sees Dr. Delana Meyer   Vertigo    Last episode approx Aug 2015   Wears dentures    full upper    Past Surgical History:  Procedure Laterality Date   ADENOIDECTOMY     AMPUTATION Right 08/11/2020   Procedure: AMPUTATION ABOVE KNEE;  Surgeon: [name], MD;  Location: [facility];  Service: Vascular;  Laterality: Right;   ARTERY BIOPSY Right 07/14/2019   Procedure: BIOPSY TEMPORAL ARTERY;  Surgeon: [name], MD;  Location: [facility];  Service: Vascular;  Laterality: Right;   BREAST CYST ASPIRATION Left    CARDIAC CATHETERIZATION  02/15/2017   [facility]   COLONOSCOPY WITH PROPOFOL N/A 10/22/2017   Procedure: COLONOSCOPY WITH PROPOFOL;  Surgeon: Lucilla Lame, MD;  Location: [place];  Service: Endoscopy;  Laterality: N/A;  specimens not taken--pt on Plavix will be brought back in after 7 days off med   COLONOSCOPY WITH  PROPOFOL N/A 11/05/2017   Procedure: COLONOSCOPY WITH PROPOFOL;  Surgeon: Lucilla Lame, MD;  Location: [place];  Service: Endoscopy;  Laterality: N/A;   ESOPHAGOGASTRODUODENOSCOPY N/A 12/21/2014   Procedure: ESOPHAGOGASTRODUODENOSCOPY (EGD);  Surgeon: Lucilla Lame, MD;  Location: [place];  Service: Gastroenterology;  Laterality: N/A;   ESOPHAGOGASTRODUODENOSCOPY (EGD) WITH PROPOFOL N/A 02/08/2016   Procedure: ESOPHAGOGASTRODUODENOSCOPY (EGD) WITH PROPOFOL;  Surgeon: Lucilla Lame, MD;  Location: [facility];  Service: Endoscopy;  Laterality: N/A;   FASCIOTOMY Right 05/30/2019   Procedure: FASCIOTOMY;  Surgeon: [name], MD;  Location: [facility];  Service: Vascular;  Laterality: Right;   FASCIOTOMY CLOSURE Right 06/04/2019   Procedure: FASCIOTOMY CLOSURE;  Surgeon: [name], MD;  Location: [facility];  Service: Vascular;  Laterality: Right;   HEMORROIDECTOMY  2014   LOWER EXTREMITY ANGIOGRAPHY Right 05/30/2019   Procedure: LOWER EXTREMITY ANGIOGRAPHY;  Surgeon: [name], MD;  Location: [place] [facility];  Service: Cardiovascular;  Laterality: Right;   LOWER EXTREMITY ANGIOGRAPHY Right 07/02/2020   Procedure: LOWER EXTREMITY ANGIOGRAPHY;  Surgeon: [name], MD;  Location: [place] [facility];  Service: Cardiovascular;  Laterality: Right;   PERIPHERAL VASCULAR CATHETERIZATION N/A 02/09/2016   Procedure: Abdominal Aortogram w/Lower Extremity;  Surgeon: Belenda Cruise  Eloise Levels, MD;  Location: [place] [facility];  Service: Cardiovascular;  Laterality: N/A;   PERIPHERAL VASCULAR CATHETERIZATION Right 02/10/2016   Procedure: Lower Extremity Angiography;  Surgeon: [name], MD;  Location: [place] [facility];  Service: Cardiovascular;  Laterality: Right;   POLYPECTOMY  11/05/2017   Procedure: POLYPECTOMY;  Surgeon: Lucilla Lame, MD;  Location: [place];  Service: Endoscopy;;   SHOULDER ARTHROSCOPY WITH ROTATOR CUFF REPAIR AND  SUBACROMIAL DECOMPRESSION Right 02/27/2020   Procedure: RIGHT SHOULDER ARTHROSCOPY SUBACROMIAL DECOMPRESSION, DISTAL CLAVICLE EXCISION AND MINI-OPEN ROTATOR CUFF REPAIR;  Surgeon: Thornton Park, MD;  Location: [facility];  Service: Orthopedics;  Laterality: Right;   TONSILLECTOMY     VASCULAR SURGERY  9518,[id number]   Fem-Pop Bypass    There were no vitals filed for this visit.   Subjective Assessment - 01/28/21 1241     Subjective Patient reports she is doing well today.  No significant changes since the initial evaluation.  Denies falls or pain. No specific questions or concerns.    Pertinent History Pt has a history of RLE vascular issues since 1991. 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 She received her RLE prosthetic in April 2022 and has been receiving [facility] since her surgery. They discharged her yesterday to continue with OP PT. She continues with phantom RLE pain, especially at night and takes gabapentin with minimal relief. She has also been having R shoulder pain since the surgery which she attributes to using a wheelchair. She underwent R RTC surgery in September of 2021 and had physical therapy afterwards. She has been having issues with bilateral carpal tunnel and has a NCV study scheduled for tomorrow.    Limitations Walking    Patient Stated Goals Ambulate with her prosthetic    Currently in Pain? No/denies                  TREATMENT   Ther-ex  With RLE prosthetic donned; NuStep L2 x 2 minutes with BUE/BLE followed by BLE only for an additional 2 minutes during history, patient monitoring fatigue and fisting with right lower extremity positioning; Supine R SLR hip flexion 2 x 10; Supine manually resisted R hip abduction and adduction 2 x 10 each; Hooklying bolster bridges 2 x 10; L sidelying R hip abduction with manual resistance 2 x 10; Prone R hip extension with manual resistance 2 x 10 (prosthetic doffed); Prone R hip  flexor stretch with pillow under residual RLE x 2 minutes (prosthetic doffed); Seated clams with manual resistance from therapist x 10 BLE; Seated adductor squeezes with manual resistance from therapist x 10 BLE; Seated marches with manual resistance from therapist x 10 BLE;   Gait Training Forward gait in // bars with quad cane in LUE, occasional cues provided for proper sequencing with cane, CGA provided throughout; Forward gait in parallel bars without upper extremity support x2 lengths; Backward, R lateral, and L lateral stepping in // bars with BUE support x 2 lengths each diretion; Gait outside on sidewalk with rolling walker, practiced walking sideways on sidewalk ramp to challenge uneven surface balance, also practiced stepping off the curb using the rolling walker;   Pt educated throughout session about proper posture and technique with exercises. Improved exercise technique, movement at target joints, use of target muscles after min to mod verbal, visual, tactile cues.    Patient demonstrates excellent motivation during session today.  Initiated mat table and seated strengthening as well as  gait training.  Performed gait in parallel bars with quad cane left upper extremity and patient demonstrates good stability.  Also practiced backwards and lateral stepping as well as gait outside on sidewalk on side slopes.  Issued HEP and patient encouraged to follow-up as scheduled. Pt will benefit from PT services to address deficits in strength, balance, and mobility in order to return to full function at home.                            PT Short Term Goals - 01/27/21 1603       PT SHORT TERM GOAL #1   Title Pt will be independent with HEP in order to improve strength and balance in order to decrease fall risk and improve function at home.    Time 4    Period Weeks    Status New    Target Date 02/23/21               PT Long Term Goals - 01/27/21 1606        PT LONG TERM GOAL #1   Title Pt will improve BERG by at least 3 points in order to demonstrate clinically significant improvement in balance.    Baseline 01/26/21: 45/56    Time 8    Period Weeks    Status New    Target Date 03/23/21      PT LONG TERM GOAL #2   Title Pt will improve ABC by at least 13% in order to demonstrate clinically significant improvement in balance confidence.    Baseline 01/26/21: 57.5%    Time 8    Period Weeks    Status New    Target Date 03/23/21      PT LONG TERM GOAL #3   Title Pt will improve FOTO to at least 63 in order to demonstrate clinically significant improvement in function related to her RLE amputation    Baseline 01/26/21: 59    Time 8    Period Weeks    Status New    Target Date 03/23/21      PT LONG TERM GOAL #4   Title Pt will increase self-selected 10MWT by at least 0.13 m/s in order to demonstrate clinically significant improvement in community ambulation.    Baseline 01/26/21: 31.5s = 0.32 m/s    Time 8    Period Weeks    Status New    Target Date 03/23/21                   Plan - 01/28/21 1104     Clinical Impression Statement Patient demonstrates excellent motivation during session today.  Initiated mat table and seated strengthening as well as gait training.  Performed gait in parallel bars with quad cane left upper extremity and patient demonstrates good stability.  Also practiced backwards and lateral stepping as well as gait outside on sidewalk on side slopes.  Issued HEP and patient encouraged to follow-up as scheduled. Pt will benefit from PT services to address deficits in strength, balance, and mobility in order to return to full function at home.    Personal Factors and Comorbidities Age;Comorbidity 3+    Comorbidities Depression, osteoporosis, vascular disease    Examination-Activity Limitations Bathing;Locomotion Level;Squat;Stairs;Stand;Transfers    Examination-Participation Restrictions Cleaning;Community  Activity;Meal Prep;Yard Work    Stability/Clinical Decision Making Unstable/Unpredictable    Rehab Potential Good    PT Frequency 2x / week    PT  Duration 12 weeks    PT Treatment/Interventions ADLs/Self Care Home Management;Aquatic Therapy;Canalith Repostioning;Cryotherapy;Electrical Stimulation;Iontophoresis 4mg /ml Dexamethasone;Moist Heat;Traction;Ultrasound;Gait training;Stair training;Therapeutic activities;Therapeutic exercise;Neuromuscular re-education;Manual techniques;Dry needling;Vestibular;Spinal Manipulations;Joint Manipulations    PT Next Visit Plan Practice gait training, issue HEP, strengthening, prone hip flexor stretch    PT Home Exercise Plan Access Code: [email or web address]    Consulted and Agree with Plan of Care Patient             Patient will benefit from skilled therapeutic intervention in order to improve the following deficits and impairments:  Abnormal gait, Decreased strength, Difficulty walking  Visit Diagnosis: Muscle weakness (generalized)  Difficulty in walking, not elsewhere classified  Unsteadiness on feet     Problem List Patient Active Problem List   Diagnosis Date Noted   Muscle spasm 09/09/2020   S/P AKA (above knee amputation), right ([place]) 09/08/2020   Ischemia of extremity 07/02/2020   Chronic pain in right shoulder 11/25/2019   Encounter for long-term (current) use of high-risk medication 07/16/2019   GCA (giant cell arteritis) ([place]) 07/16/2019   Temporal arteritis ([place]) 07/13/2019   Postoperative wound infection 06/23/2019   Ischemic leg 05/31/2019   Atherosclerosis of native arteries of extremity with intermittent claudication ([place]) 05/30/2019   Depression 05/29/2019   Eczema of lower extremity 03/04/2018   Chronic venous insufficiency 03/02/2018   Personal history of colonic polyps    Family history of colonic polyps    Benign neoplasm of ascending colon    Mitral valve insufficiency 02/08/2017   Dyspnea on exertion 02/07/2017    Non-rheumatic mitral regurgitation 02/07/2017   Precordial pain 02/07/2017   CAD (coronary artery disease) 12/21/2016   Essential hypertension 12/21/2016   Compression fracture of lumbar spine, non-traumatic ([place]) 04/26/2016   Compression fracture of L3 lumbar vertebra 04/20/2016   PVD (peripheral vascular disease) ([place]) 02/24/2016   Family history of premature CAD 02/24/2016   Gastritis    Other specified diseases of esophagus    Hiatal hernia    Gastritis and gastroduodenitis    Ischemia of lower extremity    Arterial occlusion ([facility])    Atherosclerosis of aorta ([facility])    History of smoking    Hyperlipidemia    Pain in the chest    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 02/05/2016   Carotid artery stenosis 01/04/2016   Vertigo, benign paroxysmal 12/28/2015   Clinical depression 09/06/2015   History of alcoholism ([place]) 09/06/2015   Angiopathy, 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[place]) 09/06/2015   Atherosclerosis of native arteries of extremity with rest pain ([place]) 09/06/2015   Acute non-recurrent maxillary sinusitis 07/14/2015   Vaginal pruritus 05/26/2015   Chronic recurrent major depressive disorder ([place]) 03/09/2015   Osteoporosis, post-menopausal 03/09/2015   Peripheral blood vessel disorder ([place]) 03/09/2015   Acid reflux 03/09/2015   GERD (gastroesophageal reflux disease) 12/21/2014   Carotid artery narrowing 01/28/2014   Peripheral arterial occlusive disease ([place]) 06/08/2011   Occlusion and stenosis of unspecified carotid artery 06/08/2011   PAD (peripheral artery disease) ([place]) 06/08/2011   Lyndel Safe Juanice Warburton PT, DPT, GCS  Hailee Hollick 01/28/2021, 12:52 PM  [facility] [address] [place], [place], [address] Phone: [phone]   Fax:  [phone]  Name: Sara [name] MRN: [id number] Date of Birth: [birth year]

## 2021-01-28 NOTE — Patient Instructions (Addendum)
Access Code: [email or web address] URL: [email or web address] Date: 01/28/2021 Prepared by: Roxana Hires  Exercises Supine Single Leg Bridge with Sound Leg (AKA) (Mirrored) - 1 x daily - 7 x weekly - 2 sets - 10 reps - 5s hold Supine Hip Extension with Towel Roll (AKA) (Mirrored) - 1 x daily - 7 x weekly - 2 sets - 10 reps - 5s hold Prone Hip Flexor Stretch with Towel Roll (AKA) (Mirrored) - 1 x daily - 7 x weekly - 3 reps - 2 minutes hold Sidelying Hip Abduction (AKA) (Mirrored) - 1 x daily - 7 x weekly - 2 sets - 10 reps - 5s hold

## 2021-01-30 ENCOUNTER — Encounter (INDEPENDENT_AMBULATORY_CARE_PROVIDER_SITE_OTHER): Payer: Self-pay | Admitting: Vascular Surgery

## 2021-01-30 NOTE — Progress Notes (Signed)
MRN : [id number]  Sara [name] is a 70 y.o. (09-Jun-1951) female who presents with chief complaint of  Chief Complaint  Patient presents with   Follow-up    3 Mo Lt ABI  .  History of Present Illness:   The patient returns to the office for followup and review of the noninvasive studies. There have been no interval changes in her left lower extremity symptoms. No interval shortening of the patient's left claudication distance or development of rest pain symptoms. No new ulcers or wounds have occurred since the last visit.  She has her right leg prosthesis and has been walking with it and doing PT.  There have been no significant changes to the patient's overall health care.  The patient denies amaurosis fugax or recent TIA symptoms. There are no recent neurological changes noted. The patient denies history of DVT, PE or superficial thrombophlebitis. The patient denies recent episodes of angina or shortness of breath.   ABI Rt=AKA and Lt=1.30  (previous ABI's Rt=AKA and Lt=1.33)  Current Meds  Medication Sig   apixaban (ELIQUIS) 5 MG TABS tablet Take 1 tablet (5 mg total) by mouth 2 (two) times daily.   atorvastatin (LIPITOR) 40 MG tablet Take 40 mg by mouth at bedtime.    calcium carbonate (OSCAL) 1500 (600 Ca) MG TABS tablet Take 1,200 mg of elemental calcium by mouth 2 (two) times daily with a meal.   cholecalciferol (VITAMIN D3) 25 MCG (1000 UNIT) tablet Take 1,000 Units by mouth daily.   clobetasol cream (TEMOVATE) 0.62 % Apply 1 application topically daily as needed (itching).   diltiazem (CARDIZEM CD) 240 MG 24 hr capsule Take 240 mg by mouth daily.   DULoxetine (CYMBALTA) 60 MG capsule Take 60 mg by mouth every morning.   gabapentin (NEURONTIN) 300 MG capsule TAKE 1 CAPSULE BY MOUTH THREE TIMES A DAY   ibandronate (BONIVA) 150 MG tablet Take 150 mg by mouth every 30 (thirty) days.    losartan (COZAAR) 25 MG tablet Take 25 mg by mouth every morning.     oxyCODONE-acetaminophen (PERCOCET/ROXICET) 5-325 MG tablet Take 1-2 tablets by mouth every 4 (four) hours as needed for moderate pain.   pantoprazole (PROTONIX) 40 MG tablet TAKE 1 TABLET DAILY   Tocilizumab (ACTEMRA ACTPEN) 162 MG/0.9ML SOAJ Inject 162 mg into the skin once a week.    zolpidem (AMBIEN) 5 MG tablet Take 5 mg by mouth at bedtime as needed for sleep.     Past Medical History:  Diagnosis Date   Arthritis    right shoulder   Depression    Dyspnea    GERD (gastroesophageal reflux disease)    History of blood clots    Hyperlipidemia    Hypertension    Mild mitral regurgitation    Osteoporosis    Peripheral vascular disease (HCC)    Stomach ulcer    Vascular disease    Sees Dr. Delana Meyer   Vertigo    Last episode approx Aug 2015   Wears dentures    full upper    Past Surgical History:  Procedure Laterality Date   ADENOIDECTOMY     AMPUTATION Right 08/11/2020   Procedure: AMPUTATION ABOVE KNEE;  Surgeon: [name], MD;  Location: [facility];  Service: Vascular;  Laterality: Right;   ARTERY BIOPSY Right 07/14/2019   Procedure: BIOPSY TEMPORAL ARTERY;  Surgeon: [name], MD;  Location: [facility];  Service: Vascular;  Laterality: Right;   BREAST CYST ASPIRATION Left  CARDIAC CATHETERIZATION  02/15/2017   [facility]   COLONOSCOPY WITH PROPOFOL N/A 10/22/2017   Procedure: COLONOSCOPY WITH PROPOFOL;  Surgeon: Lucilla Lame, MD;  Location: [place];  Service: Endoscopy;  Laterality: N/A;  specimens not taken--pt on Plavix will be brought back in after 7 days off med   COLONOSCOPY WITH PROPOFOL N/A 11/05/2017   Procedure: COLONOSCOPY WITH PROPOFOL;  Surgeon: Lucilla Lame, MD;  Location: [place];  Service: Endoscopy;  Laterality: N/A;   ESOPHAGOGASTRODUODENOSCOPY N/A 12/21/2014   Procedure: ESOPHAGOGASTRODUODENOSCOPY (EGD);  Surgeon: Lucilla Lame, MD;  Location: [place];  Service: Gastroenterology;  Laterality: N/A;    ESOPHAGOGASTRODUODENOSCOPY (EGD) WITH PROPOFOL N/A 02/08/2016   Procedure: ESOPHAGOGASTRODUODENOSCOPY (EGD) WITH PROPOFOL;  Surgeon: Lucilla Lame, MD;  Location: [facility];  Service: Endoscopy;  Laterality: N/A;   FASCIOTOMY Right 05/30/2019   Procedure: FASCIOTOMY;  Surgeon: [name], MD;  Location: [facility];  Service: Vascular;  Laterality: Right;   FASCIOTOMY CLOSURE Right 06/04/2019   Procedure: FASCIOTOMY CLOSURE;  Surgeon: [name], MD;  Location: [facility];  Service: Vascular;  Laterality: Right;   HEMORROIDECTOMY  2014   LOWER EXTREMITY ANGIOGRAPHY Right 05/30/2019   Procedure: LOWER EXTREMITY ANGIOGRAPHY;  Surgeon: [name], MD;  Location: [place] [facility];  Service: Cardiovascular;  Laterality: Right;   LOWER EXTREMITY ANGIOGRAPHY Right 07/02/2020   Procedure: LOWER EXTREMITY ANGIOGRAPHY;  Surgeon: [name], MD;  Location: [place] [facility];  Service: Cardiovascular;  Laterality: Right;   PERIPHERAL VASCULAR CATHETERIZATION N/A 02/09/2016   Procedure: Abdominal Aortogram w/Lower Extremity;  Surgeon: [name], MD;  Location: [place] [facility];  Service: Cardiovascular;  Laterality: N/A;   PERIPHERAL VASCULAR CATHETERIZATION Right 02/10/2016   Procedure: Lower Extremity Angiography;  Surgeon: [name], MD;  Location: [place] [facility];  Service: Cardiovascular;  Laterality: Right;   POLYPECTOMY  11/05/2017   Procedure: POLYPECTOMY;  Surgeon: Lucilla Lame, MD;  Location: [place];  Service: Endoscopy;;   SHOULDER ARTHROSCOPY WITH ROTATOR CUFF REPAIR AND SUBACROMIAL DECOMPRESSION Right 02/27/2020   Procedure: RIGHT SHOULDER ARTHROSCOPY SUBACROMIAL DECOMPRESSION, DISTAL CLAVICLE EXCISION AND MINI-OPEN ROTATOR CUFF REPAIR;  Surgeon: Thornton Park, MD;  Location: [facility];  Service: Orthopedics;  Laterality: Right;   TONSILLECTOMY     VASCULAR SURGERY  [id number]   Fem-Pop Bypass    Social History Social  History   Tobacco Use   Smoking status: Former    Packs/day: 2.00    Years: 25.00    Pack years: 50.00    Types: Cigarettes    Quit date: 08/22/1991    Years since quitting: 29.4   Smokeless tobacco: Never  Vaping Use   Vaping Use: Never used  Substance Use Topics   Alcohol use: No    Alcohol/week: 0.0 standard drinks   Drug use: Never    Family History Family History  Problem Relation Age of Onset   CVA Mother    Heart attack Mother    Cancer Father        colon cancer   Colon cancer Father    Heart disease Maternal Uncle    Breast cancer Neg Hx     Allergies  Allergen Reactions   Hydrocodone-Acetaminophen     Other reaction(s): Flushing Other reaction(s): Flushing   Amoxicillin Other (See Comments)    Yeast infection   Metoprolol Tartrate Rash   Oxycodone Itching   Vicodin [Hydrocodone-Acetaminophen] Hives and Rash    Flushing     REVIEW  OF SYSTEMS (Negative unless checked)  Constitutional: [] Weight loss  [] Fever  [] Chills Cardiac: [] Chest pain   [] Chest pressure   [] Palpitations   [] Shortness of breath when laying flat   [] Shortness of breath with exertion. Vascular:  [] Pain in legs with walking   [x] Pain in legs at rest  [] History of DVT   [] Phlebitis   [] Swelling in legs   [] Varicose veins   [] Non-healing ulcers Pulmonary:   [] Uses home oxygen   [] Productive cough   [] Hemoptysis   [] Wheeze  [] COPD   [] Asthma Neurologic:  [] Dizziness   [] Seizures   [] History of stroke   [] History of TIA  [] Aphasia   [] Vissual changes   [] Weakness or numbness in arm   [] Weakness or numbness in leg Musculoskeletal:   [] Joint swelling   [] Joint pain   [] Low back pain Hematologic:  [] Easy bruising  [] Easy bleeding   [] Hypercoagulable state   [] Anemic Gastrointestinal:  [] Diarrhea   [] Vomiting  [] Gastroesophageal reflux/heartburn   [] Difficulty swallowing. Genitourinary:  [] Chronic kidney disease   [] Difficult urination  [] Frequent urination   [] Blood in urine Skin:  [] Rashes    [] Ulcers  Psychological:  [] History of anxiety   []  History of major depression.  Physical Examination  Vitals:   01/27/21 1046  BP: 106/68  Pulse: 84  Weight: 124 lb (56.2 kg)  Height: 5\' 3"  (1.6 m)   Body mass index is 21.97 kg/m. Gen: WD/WN, NAD Head: [place]/AT, No temporalis wasting.  Ear/Nose/Throat: Hearing grossly intact, nares w/o erythema or drainage Eyes: PER, EOMI, sclera nonicteric.  Neck: Supple, no large masses.   Pulmonary:  Good air movement, no audible wheezing bilaterally, no use of accessory muscles.  Cardiac: RRR, no JVD Vascular:  Right AKA Vessel Right Left  Radial Palpable Palpable  PT AKA Palpable  DP AKA Not Palpable  Gastrointestinal: Non-distended. No guarding/no peritoneal signs.  Musculoskeletal: M/S 5/5 throughout.  No deformity or atrophy.  Neurologic: CN 2-12 intact. Symmetrical.  Speech is fluent. Motor exam as listed above. Psychiatric: Judgment intact, Mood & affect appropriate for pt's clinical situation. Dermatologic: No rashes or ulcers noted.  No changes consistent with cellulitis.   CBC Lab Results  Component Value Date   WBC 13.2 (H) 08/13/2020   HGB 9.6 (L) 08/13/2020   HCT 28.8 (L) 08/13/2020   MCV 96.3 08/13/2020   PLT 129 (L) 08/13/2020    BMET    Component Value Date/Time   NA 142 08/13/2020 0603   NA 139 09/21/2015 0916   K 4.0 08/13/2020 0603   CL 107 08/13/2020 0603   CO2 29 08/13/2020 0603   GLUCOSE 119 (H) 08/13/2020 0603   BUN 9 08/13/2020 0603   BUN 12 09/21/2015 0916   CREATININE 0.64 08/13/2020 0603   CREATININE 0.88 05/16/2016 1034   CALCIUM 8.5 (L) 08/13/2020 0603   GFRNONAA >60 08/13/2020 0603   GFRNONAA 66 05/01/2016 0846   GFRAA >60 02/18/2020 0949   GFRAA 76 05/01/2016 0846   CrCl cannot be calculated (Patient's most recent lab result is older than the maximum 21 days allowed.).  COAG Lab Results  Component Value Date   INR 1.0 08/09/2020   INR 1.3 (H) 02/18/2020   INR 1.0 07/11/2019     Radiology VAS [place] ABI WITH/WO TBI  Result Date: 01/27/2021  LOWER EXTREMITY DOPPLER STUDY Patient Name:  Sara [name]  Date of Exam:   01/27/2021 Medical Rec #: [id number]        Accession #:    [id number] Date  of Birth: 04-09-1951         Patient Gender: F Patient Age:   56Y Exam Location:  [facility] Procedure:      VAS [place] ABI WITH/WO TBI Referring Phys: [id number] [place] --------------------------------------------------------------------------------  Indications: Rest pain, and peripheral artery disease. High Risk Factors: Hypertension.  Vascular Interventions: 02/10/2016 PTA of right femoral to popliteal autogenous                         bypass graft. PTA right distal posterior tibial artery.                         12/2008 and 02/1990 Right femoral to popliteal autogenous                         bypass graft.                          05/30/2019: PTA and Stent placement at 2 locations in                         the Femoral below knee poplteal bypass. Mechanical                         Thrombectomy Right Popliteal bypass and TibioPeroneal                         trunk and Proximal Peroneal Artery. Infusion of TPA to                         the Right Femoral-Popliteal Bypass.                          05/30/2019: Compartment Syndrome Right Lower Extremity                         Status Post Revascularization.                          06/04/2019: Compartment Syndrome Status Post                         Fasciotomies. Comparison Study: 04/12/2020 Performing Technologist: Charlane Ferretti RT (R)(VS)  Examination Guidelines: A complete evaluation includes at minimum, Doppler waveform signals and systolic blood pressure reading at the level of bilateral brachial, anterior tibial, and posterior tibial arteries, when vessel segments are accessible. Bilateral testing is considered an integral part of a complete examination. Photoelectric Plethysmograph (PPG) waveforms and toe systolic pressure  readings are included as required and additional duplex testing as needed. Limited examinations for reoccurring indications may be performed as noted.  ABI Findings: +--------+------------------+-----+--------+--------+ Right   Rt Pressure (mmHg)IndexWaveformComment  +--------+------------------+-----+--------+--------+ [id number]                            AKA      +--------+------------------+-----+--------+--------+ +---------+------------------+-----+---------+-------+ Left     Lt Pressure (mmHg)IndexWaveform Comment +---------+------------------+-----+---------+-------+ Brachial 124                                     +---------+------------------+-----+---------+-------+  ATA      147               1.18 biphasic         +---------+------------------+-----+---------+-------+ PTA      162               1.30 triphasic        +---------+------------------+-----+---------+-------+ Great Toe69                0.55 Normal           +---------+------------------+-----+---------+-------+ +-------+-----------+-----------+------------+------------+ ABI/TBIToday's ABIToday's TBIPrevious ABIPrevious TBI +-------+-----------+-----------+------------+------------+ Right                                                 +-------+-----------+-----------+------------+------------+ Left   1.30       .55        1.33        .92          +-------+-----------+-----------+------------+------------+ Left ABIs appear essentially unchanged compared to prior study on 04/12/2020. Compared to prior study on 04/12/2020. Summary: Left: Resting left ankle-brachial index indicates noncompressible left lower extremity arteries. The left toe-brachial index is abnormal. *See table(s) above for measurements and observations.  Electronically signed by [name] MD on 01/27/2021 at 4:46:38 PM.    Final      Assessment/Plan 1. Atherosclerosis of native artery of both lower extremities  with intermittent claudication (HCC)  Recommend:  The patient has evidence of atherosclerosis of the lower extremities with claudication.  The patient does not voice lifestyle limiting changes at this point in time.  Noninvasive studies do not suggest clinically significant change.  No invasive studies, angiography or surgery at this time The patient should continue walking and begin a more formal exercise program.  The patient should continue antiplatelet therapy and aggressive treatment of the lipid abnormalities  No changes in the patient's medications at this time  The patient should continue wearing graduated compression socks 10-15 mmHg strength to control the mild edema.    2. S/P AKA (above knee amputation), right (HCC) Continue to use the prosthesis and work with therapy  3. Bilateral carotid artery stenosis Recommend:  Given the patient's asymptomatic subcritical stenosis no further invasive testing or surgery at this time.  Continue antiplatelet therapy as prescribed Continue management of CAD, HTN and Hyperlipidemia Healthy heart diet,  encouraged exercise at least 4 times per week Follow up in 12 months with duplex ultrasound and physical exam    4. Coronary artery disease involving native coronary artery of native heart with angina pectoris ([place]) Continue cardiac and antihypertensive medications as already ordered and reviewed, no changes at this time.  Continue statin as ordered and reviewed, no changes at this time  Nitrates PRN for chest pain   5. Essential hypertension Continue antihypertensive medications as already ordered, these medications have been reviewed and there are no changes at this time.    [name], MD  01/30/2021 11:04 AM

## 2021-01-31 ENCOUNTER — Ambulatory Visit: Payer: Medicare Other

## 2021-02-04 ENCOUNTER — Ambulatory Visit: Payer: Medicare Other

## 2021-02-04 ENCOUNTER — Other Ambulatory Visit: Payer: Self-pay

## 2021-02-04 DIAGNOSIS — M6281 Muscle weakness (generalized): Secondary | ICD-10-CM | POA: Diagnosis not present

## 2021-02-04 DIAGNOSIS — R2681 Unsteadiness on feet: Secondary | ICD-10-CM

## 2021-02-04 DIAGNOSIS — R262 Difficulty in walking, not elsewhere classified: Secondary | ICD-10-CM

## 2021-02-04 NOTE — Therapy (Signed)
[facility] [address] [place], [place], [address] Phone: [phone]   Fax:  [phone]  Physical Therapy Treatment  Patient Details  Name: Sara [name] MRN: [id number] Date of Birth: 1951/02/18 Referring Provider (PT): Dr. Delana Meyer   Encounter Date: 02/04/2021   PT End of Session - 02/04/21 1115     Visit Number 3    Number of Visits 25    Date for PT Re-Evaluation 04/20/21    Authorization Type eval: 01/26/21    PT Start Time 1020    PT Stop Time 1112    PT Time Calculation (min) 52 min    Equipment Utilized During Treatment Gait belt    Activity Tolerance Patient tolerated treatment well    Behavior During Therapy [facility] for tasks assessed/performed             Past Medical History:  Diagnosis Date   Arthritis    right shoulder   Depression    Dyspnea    GERD (gastroesophageal reflux disease)    History of blood clots    Hyperlipidemia    Hypertension    Mild mitral regurgitation    Osteoporosis    Peripheral vascular disease ([place])    Stomach ulcer    Vascular disease    Sees Dr. Delana Meyer   Vertigo    Last episode approx Aug 2015   Wears dentures    full upper    Past Surgical History:  Procedure Laterality Date   ADENOIDECTOMY     AMPUTATION Right 08/11/2020   Procedure: AMPUTATION ABOVE KNEE;  Surgeon: [name], MD;  Location: [facility];  Service: Vascular;  Laterality: Right;   ARTERY BIOPSY Right 07/14/2019   Procedure: BIOPSY TEMPORAL ARTERY;  Surgeon: [name], MD;  Location: [facility];  Service: Vascular;  Laterality: Right;   BREAST CYST ASPIRATION Left    CARDIAC CATHETERIZATION  02/15/2017   [facility]   COLONOSCOPY WITH PROPOFOL N/A 10/22/2017   Procedure: COLONOSCOPY WITH PROPOFOL;  Surgeon: Lucilla Lame, MD;  Location: [place];  Service: Endoscopy;  Laterality: N/A;  specimens not taken--pt on Plavix will be brought back in after 7 days off med   COLONOSCOPY WITH  PROPOFOL N/A 11/05/2017   Procedure: COLONOSCOPY WITH PROPOFOL;  Surgeon: Lucilla Lame, MD;  Location: [place];  Service: Endoscopy;  Laterality: N/A;   ESOPHAGOGASTRODUODENOSCOPY N/A 12/21/2014   Procedure: ESOPHAGOGASTRODUODENOSCOPY (EGD);  Surgeon: Lucilla Lame, MD;  Location: [place];  Service: Gastroenterology;  Laterality: N/A;   ESOPHAGOGASTRODUODENOSCOPY (EGD) WITH PROPOFOL N/A 02/08/2016   Procedure: ESOPHAGOGASTRODUODENOSCOPY (EGD) WITH PROPOFOL;  Surgeon: Lucilla Lame, MD;  Location: [facility];  Service: Endoscopy;  Laterality: N/A;   FASCIOTOMY Right 05/30/2019   Procedure: FASCIOTOMY;  Surgeon: [name], MD;  Location: [facility];  Service: Vascular;  Laterality: Right;   FASCIOTOMY CLOSURE Right 06/04/2019   Procedure: FASCIOTOMY CLOSURE;  Surgeon: [name], MD;  Location: [facility];  Service: Vascular;  Laterality: Right;   HEMORROIDECTOMY  2014   LOWER EXTREMITY ANGIOGRAPHY Right 05/30/2019   Procedure: LOWER EXTREMITY ANGIOGRAPHY;  Surgeon: [name], MD;  Location: [place] [facility];  Service: Cardiovascular;  Laterality: Right;   LOWER EXTREMITY ANGIOGRAPHY Right 07/02/2020   Procedure: LOWER EXTREMITY ANGIOGRAPHY;  Surgeon: [name], MD;  Location: [place] [facility];  Service: Cardiovascular;  Laterality: Right;   PERIPHERAL VASCULAR CATHETERIZATION N/A 02/09/2016   Procedure: Abdominal Aortogram w/Lower Extremity;  Surgeon: Belenda Cruise  Eloise Levels, MD;  Location: [place] [facility];  Service: Cardiovascular;  Laterality: N/A;   PERIPHERAL VASCULAR CATHETERIZATION Right 02/10/2016   Procedure: Lower Extremity Angiography;  Surgeon: [name], MD;  Location: [place] [facility];  Service: Cardiovascular;  Laterality: Right;   POLYPECTOMY  11/05/2017   Procedure: POLYPECTOMY;  Surgeon: Lucilla Lame, MD;  Location: [place];  Service: Endoscopy;;   SHOULDER ARTHROSCOPY WITH ROTATOR CUFF REPAIR AND  SUBACROMIAL DECOMPRESSION Right 02/27/2020   Procedure: RIGHT SHOULDER ARTHROSCOPY SUBACROMIAL DECOMPRESSION, DISTAL CLAVICLE EXCISION AND MINI-OPEN ROTATOR CUFF REPAIR;  Surgeon: Thornton Park, MD;  Location: [facility];  Service: Orthopedics;  Laterality: Right;   TONSILLECTOMY     VASCULAR SURGERY  3220,2542   Fem-Pop Bypass    There were no vitals filed for this visit.   Subjective Assessment - 02/04/21 1024     Subjective Patient reports she is doing well today. States she performs her HEP daily.  No significant changes since previous session.  Denies falls or pain. No specific questions or concerns.    Pertinent History Pt has a history of RLE vascular issues since 1991. 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 She received her RLE prosthetic in April 2022 and has been receiving [place] [facility] since her surgery. They discharged her yesterday to continue with OP PT. She continues with phantom RLE pain, especially at night and takes gabapentin with minimal relief. She has also been having R shoulder pain since the surgery which she attributes to using a wheelchair. She underwent R RTC surgery in September of 2021 and had physical therapy afterwards. She has been having issues with bilateral carpal tunnel and has a NCV study scheduled for tomorrow.    Limitations Walking    Patient Stated Goals Ambulate with her prosthetic    Currently in Pain? No/denies               TREATMENT     Ther-ex  RLE prosthetic donned: NuStep L2 x 2 minutes with BUE/BLE followed by BLE only for an additional 4 minutes during history, PT monitoring fatigue and fisting with right lower extremity positioning; Supine R SLR hip flexion 2 x 10; Hooklying bolster bridges 2 x 10; Right sidelying hip adduction with manual resistance 2 x 10; L sidelying R hip abduction with manual resistance 2 x 10; Prone R hip extension with manual resistance 2 x 10; Supine R Thomas hip flexor  stretch with with overpressure by therapist x 3 minutes (prosthetic doffed); Seated clams with manual resistance from therapist 2 x 10 BLE; Seated adductor squeezes with ball 2 x 10 BLE; Seated alt marches with 3# AW 2 x 1 min;     Gait Training Forward gait in // bars and throughout gym with quad cane in LUE, occasional cues provided for proper sequencing with cane, CGA provided throughout; Gait outside on sidewalk and in grass with rolling walker, also practiced stepping off the curb using the rolling walker;     Pt educated throughout session about proper posture and technique with exercises. Improved exercise technique, movement at target joints, use of target muscles after min to mod verbal, visual, tactile cues.      Patient demonstrates excellent motivation during session today.  Progressed mat table and seated strengthening as well as gait training.  Safely performed gait in parallel bars with quad cane in left upper extremity demonstrating good stability; progressed gait outside // bars with increased distance. Increased VC  to perform turns.  Also practiced gait outside on sidewalk as well as grass. Pt requires increased cueing on technique to navigate curbs. Reviewed HEP and patient encouraged to follow-up as scheduled. Pt will benefit from PT services to address deficits in strength, balance, and mobility in order to return to full function at home.          PT Short Term Goals - 01/27/21 1603       PT SHORT TERM GOAL #1   Title Pt will be independent with HEP in order to improve strength and balance in order to decrease fall risk and improve function at home.    Time 4    Period Weeks    Status New    Target Date 02/23/21               PT Long Term Goals - 01/27/21 1606       PT LONG TERM GOAL #1   Title Pt will improve BERG by at least 3 points in order to demonstrate clinically significant improvement in balance.    Baseline 01/26/21: 45/56    Time 8     Period Weeks    Status New    Target Date 03/23/21      PT LONG TERM GOAL #2   Title Pt will improve ABC by at least 13% in order to demonstrate clinically significant improvement in balance confidence.    Baseline 01/26/21: 57.5%    Time 8    Period Weeks    Status New    Target Date 03/23/21      PT LONG TERM GOAL #3   Title Pt will improve FOTO to at least 63 in order to demonstrate clinically significant improvement in function related to her RLE amputation    Baseline 01/26/21: 59    Time 8    Period Weeks    Status New    Target Date 03/23/21      PT LONG TERM GOAL #4   Title Pt will increase self-selected 10MWT by at least 0.13 m/s in order to demonstrate clinically significant improvement in community ambulation.    Baseline 01/26/21: 31.5s = 0.32 m/s    Time 8    Period Weeks    Status New    Target Date 03/23/21                   Plan - 02/04/21 1116     Clinical Impression Statement Patient demonstrates excellent motivation during session today.  Progressed mat table and seated strengthening as well as gait training.  Safely performed gait in parallel bars with quad cane in left upper extremity demonstrating good stability; progressed gait outside // bars with increased distance. Increased VC to perform turns.  Also practiced gait outside on sidewalk as well as grass. Pt requires increased cueing on technique to navigate curbs. Reviewed HEP and patient encouraged to follow-up as scheduled. Pt will benefit from PT services to address deficits in strength, balance, and mobility in order to return to full function at home.    Personal Factors and Comorbidities Age;Comorbidity 3+    Comorbidities Depression, osteoporosis, vascular disease    Examination-Activity Limitations Bathing;Locomotion Level;Squat;Stairs;Stand;Transfers    Examination-Participation Restrictions Cleaning;Community Activity;Meal Prep;Yard Work    Stability/Clinical Decision Making  Unstable/Unpredictable    Rehab Potential Good    PT Frequency 2x / week    PT Duration 12 weeks    PT Treatment/Interventions ADLs/Self Care Home Management;Aquatic Therapy;Canalith Repostioning;Cryotherapy;Electrical Stimulation;Iontophoresis 4mg /ml Dexamethasone;Moist  Heat;Traction;Ultrasound;Gait training;Stair training;Therapeutic activities;Therapeutic exercise;Neuromuscular re-education;Manual techniques;Dry needling;Vestibular;Spinal Manipulations;Joint Manipulations    PT Next Visit Plan Practice gait training, issue HEP, strengthening, prone hip flexor stretch    PT Home Exercise Plan Access Code: [email or web address]    Consulted and Agree with Plan of Care Patient             Patient will benefit from skilled therapeutic intervention in order to improve the following deficits and impairments:  Abnormal gait, Decreased strength, Difficulty walking  Visit Diagnosis: Muscle weakness (generalized)  Unsteadiness on feet  Difficulty in walking, not elsewhere classified     Problem List Patient Active Problem List   Diagnosis Date Noted   Muscle spasm 09/09/2020   S/P AKA (above knee amputation), right ([place]) 09/08/2020   Ischemia of extremity 07/02/2020   Chronic pain in right shoulder 11/25/2019   Encounter for long-term (current) use of high-risk medication 07/16/2019   GCA (giant cell arteritis) ([place]) 07/16/2019   Temporal arteritis ([place]) 07/13/2019   Postoperative wound infection 06/23/2019   Ischemic leg 05/31/2019   Atherosclerosis of native arteries of extremity with intermittent claudication ([place]) 05/30/2019   Depression 05/29/2019   Eczema of lower extremity 03/04/2018   Chronic venous insufficiency 03/02/2018   Personal history of colonic polyps    Family history of colonic polyps    Benign neoplasm of ascending colon    Mitral valve insufficiency 02/08/2017   Dyspnea on exertion 02/07/2017   Non-rheumatic mitral regurgitation 02/07/2017   Precordial pain  02/07/2017   CAD (coronary artery disease) 12/21/2016   Essential hypertension 12/21/2016   Compression fracture of lumbar spine, non-traumatic ([place]) 04/26/2016   Compression fracture of L3 lumbar vertebra 04/20/2016   PVD (peripheral vascular disease) ([place]) 02/24/2016   Family history of premature CAD 02/24/2016   Gastritis    Other specified diseases of esophagus    Hiatal hernia    Gastritis and gastroduodenitis    Ischemia of lower extremity    Arterial occlusion (H[facility])    Atherosclerosis of aorta ([place])    History of smoking    Hyperlipidemia    Pain in the chest    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 02/05/2016   Carotid artery stenosis 01/04/2016   Vertigo, benign paroxysmal 12/28/2015   Clinical depression 09/06/2015   History of alcoholism ([place]) 09/06/2015   Angiopathy, 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[place]) 09/06/2015   Atherosclerosis of native arteries of extremity with rest pain ([place]) 09/06/2015   Acute non-recurrent maxillary sinusitis 07/14/2015   Vaginal pruritus 05/26/2015   Chronic recurrent major depressive disorder ([place]) 03/09/2015   Osteoporosis, post-menopausal 03/09/2015   Peripheral blood vessel disorder ([place]) 03/09/2015   Acid reflux 03/09/2015   GERD (gastroesophageal reflux disease) 12/21/2014   Carotid artery narrowing 01/28/2014   Peripheral arterial occlusive disease ([place]) 06/08/2011   Occlusion and stenosis of unspecified carotid artery 06/08/2011   PAD (peripheral artery disease) ([place]) 06/08/2011    Patrina Levering PT, DPT  [facility] 02/04/2021, 12:42 PM  [facility] [address] [place], [place], [address] Phone: [phone]   Fax:  [phone]  Name: Sara [name] MRN: [id number] Date of Birth: [birth year]

## 2021-02-09 ENCOUNTER — Ambulatory Visit: Payer: Medicare Other

## 2021-02-09 ENCOUNTER — Other Ambulatory Visit: Payer: Self-pay

## 2021-02-09 DIAGNOSIS — R262 Difficulty in walking, not elsewhere classified: Secondary | ICD-10-CM

## 2021-02-09 DIAGNOSIS — R2681 Unsteadiness on feet: Secondary | ICD-10-CM

## 2021-02-09 DIAGNOSIS — M6281 Muscle weakness (generalized): Secondary | ICD-10-CM

## 2021-02-09 NOTE — Therapy (Signed)
[facility] [address] [place], [place], [address] Phone: [phone]   Fax:  [phone]  Physical Therapy Treatment  Patient Details  Name: Sara [name] MRN: [id number] Date of Birth: [birth year] Referring Provider (PT): Dr. Delana Meyer   Encounter Date: 02/09/2021   PT End of Session - 02/09/21 1312     Visit Number 4    Number of Visits 25    Date for PT Re-Evaluation 04/20/21    Authorization Type eval: 01/26/21    PT Start Time 1315    PT Stop Time 1400    PT Time Calculation (min) 45 min    Equipment Utilized During Treatment Gait belt    Activity Tolerance Patient tolerated treatment well    Behavior During Therapy [facility] for tasks assessed/performed             Past Medical History:  Diagnosis Date   Arthritis    right shoulder   Depression    Dyspnea    GERD (gastroesophageal reflux disease)    History of blood clots    Hyperlipidemia    Hypertension    Mild mitral regurgitation    Osteoporosis    Peripheral vascular disease ([place])    Stomach ulcer    Vascular disease    Sees Dr. Delana Meyer   Vertigo    Last episode approx Aug 2015   Wears dentures    full upper    Past Surgical History:  Procedure Laterality Date   ADENOIDECTOMY     AMPUTATION Right 08/11/2020   Procedure: AMPUTATION ABOVE KNEE;  Surgeon: [name], MD;  Location: [facility];  Service: Vascular;  Laterality: Right;   ARTERY BIOPSY Right 07/14/2019   Procedure: BIOPSY TEMPORAL ARTERY;  Surgeon: [name], MD;  Location: [facility];  Service: Vascular;  Laterality: Right;   BREAST CYST ASPIRATION Left    CARDIAC CATHETERIZATION  02/15/2017   [facility]   COLONOSCOPY WITH PROPOFOL N/A 10/22/2017   Procedure: COLONOSCOPY WITH PROPOFOL;  Surgeon: Lucilla Lame, MD;  Location: [place];  Service: Endoscopy;  Laterality: N/A;  specimens not taken--pt on Plavix will be brought back in after 7 days off med   COLONOSCOPY WITH  PROPOFOL N/A 11/05/2017   Procedure: COLONOSCOPY WITH PROPOFOL;  Surgeon: Lucilla Lame, MD;  Location: [place];  Service: Endoscopy;  Laterality: N/A;   ESOPHAGOGASTRODUODENOSCOPY N/A 12/21/2014   Procedure: ESOPHAGOGASTRODUODENOSCOPY (EGD);  Surgeon: Lucilla Lame, MD;  Location: [place];  Service: Gastroenterology;  Laterality: N/A;   ESOPHAGOGASTRODUODENOSCOPY (EGD) WITH PROPOFOL N/A 02/08/2016   Procedure: ESOPHAGOGASTRODUODENOSCOPY (EGD) WITH PROPOFOL;  Surgeon: Lucilla Lame, MD;  Location: [facility];  Service: Endoscopy;  Laterality: N/A;   FASCIOTOMY Right 05/30/2019   Procedure: FASCIOTOMY;  Surgeon: [name], MD;  Location: [facility];  Service: Vascular;  Laterality: Right;   FASCIOTOMY CLOSURE Right 06/04/2019   Procedure: FASCIOTOMY CLOSURE;  Surgeon: [name], MD;  Location: [facility];  Service: Vascular;  Laterality: Right;   HEMORROIDECTOMY  2014   LOWER EXTREMITY ANGIOGRAPHY Right 05/30/2019   Procedure: LOWER EXTREMITY ANGIOGRAPHY;  Surgeon: [name], MD;  Location: [place] [facility];  Service: Cardiovascular;  Laterality: Right;   LOWER EXTREMITY ANGIOGRAPHY Right 07/02/2020   Procedure: LOWER EXTREMITY ANGIOGRAPHY;  Surgeon: [name], MD;  Location: [place] [facility];  Service: Cardiovascular;  Laterality: Right;   PERIPHERAL VASCULAR CATHETERIZATION N/A 02/09/2016   Procedure: Abdominal Aortogram w/Lower Extremity;  Surgeon: Belenda Cruise  Eloise Levels, MD;  Location: [place] [facility];  Service: Cardiovascular;  Laterality: N/A;   PERIPHERAL VASCULAR CATHETERIZATION Right 02/10/2016   Procedure: Lower Extremity Angiography;  Surgeon: [name], MD;  Location: [place] [facility];  Service: Cardiovascular;  Laterality: Right;   POLYPECTOMY  11/05/2017   Procedure: POLYPECTOMY;  Surgeon: Lucilla Lame, MD;  Location: [place];  Service: Endoscopy;;   SHOULDER ARTHROSCOPY WITH ROTATOR CUFF REPAIR AND  SUBACROMIAL DECOMPRESSION Right 02/27/2020   Procedure: RIGHT SHOULDER ARTHROSCOPY SUBACROMIAL DECOMPRESSION, DISTAL CLAVICLE EXCISION AND MINI-OPEN ROTATOR CUFF REPAIR;  Surgeon: Thornton Park, MD;  Location: [facility];  Service: Orthopedics;  Laterality: Right;   TONSILLECTOMY     VASCULAR SURGERY  [id number]   Fem-Pop Bypass    There were no vitals filed for this visit.   Subjective Assessment - 02/09/21 1311     Subjective Patient reports she is doing well today. States she performs her HEP daily.  No significant changes since previous session. She has been walking around the house with her prosthetic and rolling walker.  Denies falls or pain. Her phatom limb pain has been improving. No specific questions or concerns.    Pertinent History Pt has a history of RLE vascular issues since 1991. 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 She received her RLE prosthetic in April 2022 and has been receiving [facility] since her surgery. They discharged her yesterday to continue with OP PT. She continues with phantom RLE pain, especially at night and takes gabapentin with minimal relief. She has also been having R shoulder pain since the surgery which she attributes to using a wheelchair. She underwent R RTC surgery in September of 2021 and had physical therapy afterwards. She has been having issues with bilateral carpal tunnel and has a NCV study scheduled for tomorrow.    Limitations Walking    Patient Stated Goals Ambulate with her prosthetic    Currently in Pain? No/denies                   TREATMENT     Ther-ex  RLE prosthetic donned: NuStep L0-2 x 4 minutes with BLE only during history, PT monitoring fatigue and assisting with placement/adjustment of RLE; L toe taps on 6" step in // bars without UE support x 10; R single leg balance practice in // bars without UE support x multiple bouts;  With prosthetic doffed: Supine R SLR hip flexion with  manual resistance from therapist 2 x 20; Supine RLE bridge with residual RLE resting on stool (towel on top for cushion), arms at side, 2 x 10; Supine LLE single leg bridge x 10; Supine RLE long axis manually resisted external and internal rotation 2 x 10 each; L sidelying R hip abduction with manual resistance 2 x 10; L sidelying L hip adduction with manual resistance 2 x 10; R sidelying L hip abduction with manual resistance 2 x 10; R sidelying R hip adduction with manual resistance 2 x 10;    Gait Training Forward gait in throughout gym with single point cane in LUE, occasional cues provided for increase L step length to promote toe off on prosthetic RLE, 2 losses of balance requiring therapist support to prevent her from falling; Forward/backward gait in // bars with BUE support x 2 lengths each; Lateral stepping in // bars with BUE support x 2 lengths each direction;     Pt educated throughout session about proper posture and technique  with exercises. Improved exercise technique, movement at target joints, use of target muscles after min to mod verbal, visual, tactile cues.      Patient demonstrates excellent motivation during session today.  Progressed mat table strengthening today during session. Also progressed additional gait training with single point cane and she does have two losses of balance requiring therapist assist to prevent her from falling. Pt does have significant difficulty with R single leg balance without UE support. Patient encouraged to follow-up as scheduled. Pt will benefit from PT services to address deficits in strength, balance, and mobility in order to return to full function at home.                          PT Short Term Goals - 01/27/21 1603       PT SHORT TERM GOAL #1   Title Pt will be independent with HEP in order to improve strength and balance in order to decrease fall risk and improve function at home.    Time 4    Period Weeks     Status New    Target Date 02/23/21               PT Long Term Goals - 01/27/21 1606       PT LONG TERM GOAL #1   Title Pt will improve BERG by at least 3 points in order to demonstrate clinically significant improvement in balance.    Baseline 01/26/21: 45/56    Time 8    Period Weeks    Status New    Target Date 03/23/21      PT LONG TERM GOAL #2   Title Pt will improve ABC by at least 13% in order to demonstrate clinically significant improvement in balance confidence.    Baseline 01/26/21: 57.5%    Time 8    Period Weeks    Status New    Target Date 03/23/21      PT LONG TERM GOAL #3   Title Pt will improve FOTO to at least 63 in order to demonstrate clinically significant improvement in function related to her RLE amputation    Baseline 01/26/21: 59    Time 8    Period Weeks    Status New    Target Date 03/23/21      PT LONG TERM GOAL #4   Title Pt will increase self-selected 10MWT by at least 0.13 m/s in order to demonstrate clinically significant improvement in community ambulation.    Baseline 01/26/21: 31.5s = 0.32 m/s    Time 8    Period Weeks    Status New    Target Date 03/23/21                   Plan - 02/09/21 1312     Clinical Impression Statement Patient demonstrates excellent motivation during session today.  Progressed mat table strengthening today during session. Also progressed additional gait training with single point cane and she does have two losses of balance requiring therapist assist to prevent her from falling. Pt does have significant difficulty with R single leg balance without UE support. Patient encouraged to follow-up as scheduled. Pt will benefit from PT services to address deficits in strength, balance, and mobility in order to return to full function at home.    Personal Factors and Comorbidities Age;Comorbidity 3+    Comorbidities Depression, osteoporosis, vascular disease    Examination-Activity Limitations  Bathing;Locomotion Level;Squat;Stairs;Stand;Transfers  Examination-Participation Restrictions Cleaning;Community Activity;Meal Prep;Yard Work    Stability/Clinical Decision Making Unstable/Unpredictable    Rehab Potential Good    PT Frequency 2x / week    PT Duration 12 weeks    PT Treatment/Interventions ADLs/Self Care Home Management;Aquatic Therapy;Canalith Repostioning;Cryotherapy;Electrical Stimulation;Iontophoresis 4mg /ml Dexamethasone;Moist Heat;Traction;Ultrasound;Gait training;Stair training;Therapeutic activities;Therapeutic exercise;Neuromuscular re-education;Manual techniques;Dry needling;Vestibular;Spinal Manipulations;Joint Manipulations    PT Next Visit Plan Practice gait training, issue HEP, strengthening, prone hip flexor stretch    PT Home Exercise Plan Access Code: [email or web address]    Consulted and Agree with Plan of Care Patient             Patient will benefit from skilled therapeutic intervention in order to improve the following deficits and impairments:  Abnormal gait, Decreased strength, Difficulty walking  Visit Diagnosis: Muscle weakness (generalized)  Difficulty in walking, not elsewhere classified  Unsteadiness on feet     Problem List Patient Active Problem List   Diagnosis Date Noted   Muscle spasm 09/09/2020   S/P AKA (above knee amputation), right ([place]) 09/08/2020   Ischemia of extremity 07/02/2020   Chronic pain in right shoulder 11/25/2019   Encounter for long-term (current) use of high-risk medication 07/16/2019   GCA (giant cell arteritis) ([place]) 07/16/2019   Temporal arteritis ([place]) 07/13/2019   Postoperative wound infection 06/23/2019   Ischemic leg 05/31/2019   Atherosclerosis of native arteries of extremity with intermittent claudication ([place]) 05/30/2019   Depression 05/29/2019   Eczema of lower extremity 03/04/2018   Chronic venous insufficiency 03/02/2018   Personal history of colonic polyps    Family history of colonic polyps     Benign neoplasm of ascending colon    Mitral valve insufficiency 02/08/2017   Dyspnea on exertion 02/07/2017   Non-rheumatic mitral regurgitation 02/07/2017   Precordial pain 02/07/2017   CAD (coronary artery disease) 12/21/2016   Essential hypertension 12/21/2016   Compression fracture of lumbar spine, non-traumatic ([place]) 04/26/2016   Compression fracture of L3 lumbar vertebra 04/20/2016   PVD (peripheral vascular disease) ([place]) 02/24/2016   Family history of premature CAD 02/24/2016   Gastritis    Other specified diseases of esophagus    Hiatal hernia    Gastritis and gastroduodenitis    Ischemia of lower extremity    Arterial occlusion (H[facility])    Atherosclerosis of aorta (HCC)    History of smoking    Hyperlipidemia    Pain in the chest    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 02/05/2016   Carotid artery stenosis 01/04/2016   Vertigo, benign paroxysmal 12/28/2015   Clinical depression 09/06/2015   History of alcoholism ([place]) 09/06/2015   Angiopathy, 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[place]) 09/06/2015   Atherosclerosis of native arteries of extremity with rest pain ([place]) 09/06/2015   Acute non-recurrent maxillary sinusitis 07/14/2015   Vaginal pruritus 05/26/2015   Chronic recurrent major depressive disorder ([place]) 03/09/2015   Osteoporosis, post-menopausal 03/09/2015   Peripheral blood vessel disorder ([place]) 03/09/2015   Acid reflux 03/09/2015   GERD (gastroesophageal reflux disease) 12/21/2014   Carotid artery narrowing 01/28/2014   Peripheral arterial occlusive disease ([place]) 06/08/2011   Occlusion and stenosis of unspecified carotid artery 06/08/2011   PAD (peripheral artery disease) ([place]) 06/08/2011    Lyndel Safe Tod Abrahamsen PT, DPT, GCS  Jody Silas 02/09/2021, 5:20 PM  [place] [address] [place], [place], [address] Phone: [phone]   Fax:  [phone]  Name: Sara [name] MRN: [id number] Date of Birth:  [birth year]

## 2021-02-11 ENCOUNTER — Ambulatory Visit: Payer: Medicare Other

## 2021-02-11 ENCOUNTER — Other Ambulatory Visit: Payer: Self-pay

## 2021-02-11 DIAGNOSIS — M6281 Muscle weakness (generalized): Secondary | ICD-10-CM | POA: Diagnosis not present

## 2021-02-11 DIAGNOSIS — R262 Difficulty in walking, not elsewhere classified: Secondary | ICD-10-CM

## 2021-02-11 NOTE — Patient Instructions (Addendum)
Access Code: [email or web address] URL: [email or web address] Date: 02/11/2021 Prepared by: Roxana Hires  Exercises Supine Single Leg Bridge with Sound Leg (AKA) (Mirrored) - 1 x daily - 7 x weekly - 2 sets - 10 reps - 5s hold Supine Hip Extension with Towel Roll (AKA) (Mirrored) - 1 x daily - 7 x weekly - 2 sets - 10 reps - 5s hold Prone Hip Flexor Stretch with Towel Roll (AKA) (Mirrored) - 1 x daily - 7 x weekly - 3 reps - 2 minutes hold Sidelying Hip Abduction (AKA) (Mirrored) - 1 x daily - 7 x weekly - 2 sets - 10 reps - 5s hold Tandem Balance - Prosthetic Foot Forward (AKA) - 1 x daily - 7 x weekly - 3 x 30s hold Tandem Balance - Prosthetic Foot Back (AKA) - 1 x daily - 7 x weekly - 3 x 30s hold

## 2021-02-11 NOTE — Therapy (Signed)
[facility] [address]. [place], [place], [address] Phone: [phone]   Fax:  [phone]  Physical Therapy Treatment  Patient Details  Name: Sara [name] MRN: [id number] Date of Birth: [birth year] Referring Provider (PT): Dr. Delana Meyer   Encounter Date: 02/11/2021   PT End of Session - 02/11/21 1044     Visit Number 5    Number of Visits 25    Date for PT Re-Evaluation 04/20/21    Authorization Type eval: 01/26/21    PT Start Time 0945    PT Stop Time 1035    PT Time Calculation (min) 50 min    Equipment Utilized During Treatment Gait belt    Activity Tolerance Patient tolerated treatment well    Behavior During Therapy [facility] for tasks assessed/performed             Past Medical History:  Diagnosis Date   Arthritis    right shoulder   Depression    Dyspnea    GERD (gastroesophageal reflux disease)    History of blood clots    Hyperlipidemia    Hypertension    Mild mitral regurgitation    Osteoporosis    Peripheral vascular disease ([place])    Stomach ulcer    Vascular disease    Sees Dr. Delana Meyer   Vertigo    Last episode approx Aug 2015   Wears dentures    full upper    Past Surgical History:  Procedure Laterality Date   ADENOIDECTOMY     AMPUTATION Right 08/11/2020   Procedure: AMPUTATION ABOVE KNEE;  Surgeon: [name], MD;  Location: [facility];  Service: Vascular;  Laterality: Right;   ARTERY BIOPSY Right 07/14/2019   Procedure: BIOPSY TEMPORAL ARTERY;  Surgeon: [name], MD;  Location: [facility];  Service: Vascular;  Laterality: Right;   BREAST CYST ASPIRATION Left    CARDIAC CATHETERIZATION  02/15/2017   [facility]   COLONOSCOPY WITH PROPOFOL N/A 10/22/2017   Procedure: COLONOSCOPY WITH PROPOFOL;  Surgeon: Lucilla Lame, MD;  Location: [place];  Service: Endoscopy;  Laterality: N/A;  specimens not taken--pt on Plavix will be brought back in after 7 days off med   COLONOSCOPY WITH  PROPOFOL N/A 11/05/2017   Procedure: COLONOSCOPY WITH PROPOFOL;  Surgeon: Lucilla Lame, MD;  Location: [place];  Service: Endoscopy;  Laterality: N/A;   ESOPHAGOGASTRODUODENOSCOPY N/A 12/21/2014   Procedure: ESOPHAGOGASTRODUODENOSCOPY (EGD);  Surgeon: Lucilla Lame, MD;  Location: [place];  Service: Gastroenterology;  Laterality: N/A;   ESOPHAGOGASTRODUODENOSCOPY (EGD) WITH PROPOFOL N/A 02/08/2016   Procedure: ESOPHAGOGASTRODUODENOSCOPY (EGD) WITH PROPOFOL;  Surgeon: Lucilla Lame, MD;  Location: [facility];  Service: Endoscopy;  Laterality: N/A;   FASCIOTOMY Right 05/30/2019   Procedure: FASCIOTOMY;  Surgeon: [name], MD;  Location: [facility];  Service: Vascular;  Laterality: Right;   FASCIOTOMY CLOSURE Right 06/04/2019   Procedure: FASCIOTOMY CLOSURE;  Surgeon: [name], MD;  Location: [facility];  Service: Vascular;  Laterality: Right;   HEMORROIDECTOMY  2014   LOWER EXTREMITY ANGIOGRAPHY Right 05/30/2019   Procedure: LOWER EXTREMITY ANGIOGRAPHY;  Surgeon: [name], MD;  Location: [place] [facility];  Service: Cardiovascular;  Laterality: Right;   LOWER EXTREMITY ANGIOGRAPHY Right 07/02/2020   Procedure: LOWER EXTREMITY ANGIOGRAPHY;  Surgeon: [name], MD;  Location: [place] [facility];  Service: Cardiovascular;  Laterality: Right;   PERIPHERAL VASCULAR CATHETERIZATION N/A 02/09/2016   Procedure: Abdominal Aortogram w/Lower Extremity;  Surgeon: Belenda Cruise  Eloise Levels, MD;  Location: [place] [facility];  Service: Cardiovascular;  Laterality: N/A;   PERIPHERAL VASCULAR CATHETERIZATION Right 02/10/2016   Procedure: Lower Extremity Angiography;  Surgeon: [name], MD;  Location: [place] [facility];  Service: Cardiovascular;  Laterality: Right;   POLYPECTOMY  11/05/2017   Procedure: POLYPECTOMY;  Surgeon: Lucilla Lame, MD;  Location: [place];  Service: Endoscopy;;   SHOULDER ARTHROSCOPY WITH ROTATOR CUFF REPAIR AND  SUBACROMIAL DECOMPRESSION Right 02/27/2020   Procedure: RIGHT SHOULDER ARTHROSCOPY SUBACROMIAL DECOMPRESSION, DISTAL CLAVICLE EXCISION AND MINI-OPEN ROTATOR CUFF REPAIR;  Surgeon: Thornton Park, MD;  Location: [facility];  Service: Orthopedics;  Laterality: Right;   TONSILLECTOMY     VASCULAR SURGERY  [id number]   Fem-Pop Bypass    There were no vitals filed for this visit.   Subjective Assessment - 02/11/21 0951     Subjective Patient reports she is doing alright today. States she performs her HEP daily.  No significant changes since previous session.  She has been walking around the house with her prosthetic and rolling walker. Complains of 5/10 R shoulder pain upon arrival which is attributed to her wheelchair use. Her phatom limb pain has been improving. No specific questions or concerns.    Pertinent History Pt has a history of RLE vascular issues since 1991. 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 She received her RLE prosthetic in April 2022 and has been receiving [facility] since her surgery. They discharged her yesterday to continue with OP PT. She continues with phantom RLE pain, especially at night and takes gabapentin with minimal relief. She has also been having R shoulder pain since the surgery which she attributes to using a wheelchair. She underwent R RTC surgery in September of 2021 and had physical therapy afterwards. She has been having issues with bilateral carpal tunnel and has a NCV study scheduled for tomorrow.    Limitations Walking    Patient Stated Goals Ambulate with her prosthetic    Currently in Pain? Yes    Pain Location Shoulder    Pain Orientation Right    Pain Descriptors / Indicators Aching    Pain Type Chronic pain    Pain Onset More than a month ago    Pain Frequency Intermittent               TREATMENT     Ther-ex  RLE prosthetic donned: NuStep L0-2 x 4 minutes with BLE only during history, PT monitoring  fatigue and assisting with placement/adjustment of RLE;   With prosthetic doffed: Supine R SLR hip flexion with manual resistance from therapist 2 x 20; Supine RLE bridge with residual RLE resting on stool (towel on top for cushion), 3s hold, arms at side, 2 x 10; Supine LLE single leg bridge 2 x 10; Supine RLE long axis manually resisted external and internal rotation 2 x 15 each; L sidelying R hip abduction with manual resistance 2 x 15; L sidelying L hip adduction with manual resistance 2 x 15; L sidelying L hip extension from 90 hip flexion to available end range extension with manual resistance 2 x 15; R sidelying L hip abduction with manual resistance 2 x 15; R sidelying R hip adduction with manual resistance 2 x 15; Seated on BOSU (round side up), RLE off floor, 4# med ball diagonal lifts from low to high x 10 on each side with therapist providing CGA/minA+1 to stabilize;     Gait Training  Forward gait in throughout gym with single point cane in LUE, improved L step length and R toe off without any LOG; Gait outside using single point cane across mulch, grass, and concrete including one curb navigation (descend), no cues for proper sequencing. Therapist providing mostly CGA however does have to provide modA+1 to prevent fall during one LOB.      Pt educated throughout session about proper posture and technique with exercises. Improved exercise technique, movement at target joints, use of target muscles after min to mod verbal, visual, tactile cues.      Patient demonstrates excellent motivation during session today.  Progressed mat table strengthening today during session. Also progressed additional gait training with single point cane including gait outside across mulch, grass, and concrete as well as curb navigation. Only one LOB with single point cane today when walking across the grass. HEP progression provided today to include tandem balance. Patient encouraged to follow-up as  scheduled. Pt will benefit from PT services to address deficits in strength, balance, and mobility in order to return to full function at home.                             PT Short Term Goals - 01/27/21 1603       PT SHORT TERM GOAL #1   Title Pt will be independent with HEP in order to improve strength and balance in order to decrease fall risk and improve function at home.    Time 4    Period Weeks    Status New    Target Date 02/23/21               PT Long Term Goals - 01/27/21 1606       PT LONG TERM GOAL #1   Title Pt will improve BERG by at least 3 points in order to demonstrate clinically significant improvement in balance.    Baseline 01/26/21: 45/56    Time 8    Period Weeks    Status New    Target Date 03/23/21      PT LONG TERM GOAL #2   Title Pt will improve ABC by at least 13% in order to demonstrate clinically significant improvement in balance confidence.    Baseline 01/26/21: 57.5%    Time 8    Period Weeks    Status New    Target Date 03/23/21      PT LONG TERM GOAL #3   Title Pt will improve FOTO to at least 63 in order to demonstrate clinically significant improvement in function related to her RLE amputation    Baseline 01/26/21: 59    Time 8    Period Weeks    Status New    Target Date 03/23/21      PT LONG TERM GOAL #4   Title Pt will increase self-selected 10MWT by at least 0.13 m/s in order to demonstrate clinically significant improvement in community ambulation.    Baseline 01/26/21: 31.5s = 0.32 m/s    Time 8    Period Weeks    Status New    Target Date 03/23/21                   Plan - 02/11/21 1019     Clinical Impression Statement Patient demonstrates excellent motivation during session today.  Progressed mat table strengthening today during session. Also progressed additional gait training with single point cane including gait outside  across mulch, grass, and concrete as well as curb navigation. Only one  LOB with single point cane today when walking across the grass. HEP progression provided today to include tandem balance. Patient encouraged to follow-up as scheduled. Pt will benefit from PT services to address deficits in strength, balance, and mobility in order to return to full function at home.    Personal Factors and Comorbidities Age;Comorbidity 3+    Comorbidities Depression, osteoporosis, vascular disease    Examination-Activity Limitations Bathing;Locomotion Level;Squat;Stairs;Stand;Transfers    Examination-Participation Restrictions Cleaning;Community Activity;Meal Prep;Yard Work    Stability/Clinical Decision Making Unstable/Unpredictable    Rehab Potential Good    PT Frequency 2x / week    PT Duration 12 weeks    PT Treatment/Interventions ADLs/Self Care Home Management;Aquatic Therapy;Canalith Repostioning;Cryotherapy;Electrical Stimulation;Iontophoresis 4mg /ml Dexamethasone;Moist Heat;Traction;Ultrasound;Gait training;Stair training;Therapeutic activities;Therapeutic exercise;Neuromuscular re-education;Manual techniques;Dry needling;Vestibular;Spinal Manipulations;Joint Manipulations    PT Next Visit Plan Practice gait training, issue HEP, strengthening, prone hip flexor stretch    PT Home Exercise Plan Access Code: [email or web address]    Consulted and Agree with Plan of Care Patient             Patient will benefit from skilled therapeutic intervention in order to improve the following deficits and impairments:  Abnormal gait, Decreased strength, Difficulty walking  Visit Diagnosis: Muscle weakness (generalized)  Difficulty in walking, not elsewhere classified     Problem List Patient Active Problem List   Diagnosis Date Noted   Muscle spasm 09/09/2020   S/P AKA (above knee amputation), right ([place]) 09/08/2020   Ischemia of extremity 07/02/2020   Chronic pain in right shoulder 11/25/2019   Encounter for long-term (current) use of high-risk medication 07/16/2019   GCA (giant  cell arteritis) ([place]) 07/16/2019   Temporal arteritis ([place]) 07/13/2019   Postoperative wound infection 06/23/2019   Ischemic leg 05/31/2019   Atherosclerosis of native arteries of extremity with intermittent claudication ([place]) 05/30/2019   Depression 05/29/2019   Eczema of lower extremity 03/04/2018   Chronic venous insufficiency 03/02/2018   Personal history of colonic polyps    Family history of colonic polyps    Benign neoplasm of ascending colon    Mitral valve insufficiency 02/08/2017   Dyspnea on exertion 02/07/2017   Non-rheumatic mitral regurgitation 02/07/2017   Precordial pain 02/07/2017   CAD (coronary artery disease) 12/21/2016   Essential hypertension 12/21/2016   Compression fracture of lumbar spine, non-traumatic ([place]) 04/26/2016   Compression fracture of L3 lumbar vertebra 04/20/2016   PVD (peripheral vascular disease) ([place]) 02/24/2016   Family history of premature CAD 02/24/2016   Gastritis    Other specified diseases of esophagus    Hiatal hernia    Gastritis and gastroduodenitis    Ischemia of lower extremity    Arterial occlusion (H[facility])    Atherosclerosis of aorta ([place])    History of smoking    Hyperlipidemia    Pain in the chest    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 02/05/2016   Carotid artery stenosis 01/04/2016   Vertigo, benign paroxysmal 12/28/2015   Clinical depression 09/06/2015   History of alcoholism ([place]) 09/06/2015   Angiopathy, 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[place]) 09/06/2015   Atherosclerosis of native arteries of extremity with rest pain ([place]) 09/06/2015   Acute non-recurrent maxillary sinusitis 07/14/2015   Vaginal pruritus 05/26/2015   Chronic recurrent major depressive disorder ([place]) 03/09/2015   Osteoporosis, post-menopausal 03/09/2015   Peripheral blood vessel disorder ([place]) 03/09/2015   Acid reflux 03/09/2015   GERD (gastroesophageal reflux disease) 12/21/2014  Carotid artery narrowing 01/28/2014   Peripheral arterial  occlusive disease ([place]) 06/08/2011   Occlusion and stenosis of unspecified carotid artery 06/08/2011   PAD (peripheral artery disease) ([place]) 06/08/2011   Phillips Grout PT, DPT, GCS  Frank Novelo 02/11/2021, 10:45 AM  [place] [facility] [address] [place], [place], [address] Phone: [phone]   Fax:  [phone]  Name: Sara [name] MRN: [id number] Date of Birth: Sep 30, 1950

## 2021-02-16 ENCOUNTER — Ambulatory Visit: Payer: Medicare Other | Admitting: Physical Therapy

## 2021-02-16 ENCOUNTER — Other Ambulatory Visit: Payer: Self-pay

## 2021-02-16 DIAGNOSIS — M6281 Muscle weakness (generalized): Secondary | ICD-10-CM | POA: Diagnosis not present

## 2021-02-16 DIAGNOSIS — R2681 Unsteadiness on feet: Secondary | ICD-10-CM

## 2021-02-16 DIAGNOSIS — R262 Difficulty in walking, not elsewhere classified: Secondary | ICD-10-CM

## 2021-02-16 NOTE — Therapy (Signed)
[facility] [address] [place], [place], [address] Phone: [phone]   Fax:  [phone]  Physical Therapy Treatment  Patient Details  Name: Sara [name] MRN: [id number] Date of Birth: 12-29-1950 Referring Provider (PT): Dr. Delana Meyer   Encounter Date: 02/16/2021   PT End of Session - 02/16/21 1511     Visit Number 6    Number of Visits 25    Date for PT Re-Evaluation 04/20/21    Authorization Type eval: 01/26/21    PT Start Time 1400    PT Stop Time 1445    PT Time Calculation (min) 45 min    Equipment Utilized During Treatment Gait belt    Activity Tolerance Patient tolerated treatment well    Behavior During Therapy [facility] for tasks assessed/performed             Past Medical History:  Diagnosis Date   Arthritis    right shoulder   Depression    Dyspnea    GERD (gastroesophageal reflux disease)    History of blood clots    Hyperlipidemia    Hypertension    Mild mitral regurgitation    Osteoporosis    Peripheral vascular disease ([place])    Stomach ulcer    Vascular disease    Sees Dr. Delana Meyer   Vertigo    Last episode approx Aug 2015   Wears dentures    full upper    Past Surgical History:  Procedure Laterality Date   ADENOIDECTOMY     AMPUTATION Right 08/11/2020   Procedure: AMPUTATION ABOVE KNEE;  Surgeon: [name], MD;  Location: [facility];  Service: Vascular;  Laterality: Right;   ARTERY BIOPSY Right 07/14/2019   Procedure: BIOPSY TEMPORAL ARTERY;  Surgeon: [name], MD;  Location: [facility];  Service: Vascular;  Laterality: Right;   BREAST CYST ASPIRATION Left    CARDIAC CATHETERIZATION  02/15/2017   [facility]   COLONOSCOPY WITH PROPOFOL N/A 10/22/2017   Procedure: COLONOSCOPY WITH PROPOFOL;  Surgeon: Lucilla Lame, MD;  Location: [place];  Service: Endoscopy;  Laterality: N/A;  specimens not taken--pt on Plavix will be brought back in after 7 days off med   COLONOSCOPY WITH  PROPOFOL N/A 11/05/2017   Procedure: COLONOSCOPY WITH PROPOFOL;  Surgeon: Lucilla Lame, MD;  Location: [place];  Service: Endoscopy;  Laterality: N/A;   ESOPHAGOGASTRODUODENOSCOPY N/A 12/21/2014   Procedure: ESOPHAGOGASTRODUODENOSCOPY (EGD);  Surgeon: Lucilla Lame, MD;  Location: [place];  Service: Gastroenterology;  Laterality: N/A;   ESOPHAGOGASTRODUODENOSCOPY (EGD) WITH PROPOFOL N/A 02/08/2016   Procedure: ESOPHAGOGASTRODUODENOSCOPY (EGD) WITH PROPOFOL;  Surgeon: Lucilla Lame, MD;  Location: [facility];  Service: Endoscopy;  Laterality: N/A;   FASCIOTOMY Right 05/30/2019   Procedure: FASCIOTOMY;  Surgeon: [name], MD;  Location: [facility];  Service: Vascular;  Laterality: Right;   FASCIOTOMY CLOSURE Right 06/04/2019   Procedure: FASCIOTOMY CLOSURE;  Surgeon: [name], MD;  Location: [facility];  Service: Vascular;  Laterality: Right;   HEMORROIDECTOMY  2014   LOWER EXTREMITY ANGIOGRAPHY Right 05/30/2019   Procedure: LOWER EXTREMITY ANGIOGRAPHY;  Surgeon: [name], MD;  Location: [place] [facility];  Service: Cardiovascular;  Laterality: Right;   LOWER EXTREMITY ANGIOGRAPHY Right 07/02/2020   Procedure: LOWER EXTREMITY ANGIOGRAPHY;  Surgeon: [name], MD;  Location: [place] [facility];  Service: Cardiovascular;  Laterality: Right;   PERIPHERAL VASCULAR CATHETERIZATION N/A 02/09/2016   Procedure: Abdominal Aortogram w/Lower Extremity;  Surgeon: Belenda Cruise  Eloise Levels, MD;  Location: [place] [facility];  Service: Cardiovascular;  Laterality: N/A;   PERIPHERAL VASCULAR CATHETERIZATION Right 02/10/2016   Procedure: Lower Extremity Angiography;  Surgeon: [name], MD;  Location: [place] [facility];  Service: Cardiovascular;  Laterality: Right;   POLYPECTOMY  11/05/2017   Procedure: POLYPECTOMY;  Surgeon: Lucilla Lame, MD;  Location: [place];  Service: Endoscopy;;   SHOULDER ARTHROSCOPY WITH ROTATOR CUFF REPAIR AND  SUBACROMIAL DECOMPRESSION Right 02/27/2020   Procedure: RIGHT SHOULDER ARTHROSCOPY SUBACROMIAL DECOMPRESSION, DISTAL CLAVICLE EXCISION AND MINI-OPEN ROTATOR CUFF REPAIR;  Surgeon: Thornton Park, MD;  Location: [facility];  Service: Orthopedics;  Laterality: Right;   TONSILLECTOMY     VASCULAR SURGERY  [id number],[id number]   Fem-Pop Bypass    There were no vitals filed for this visit.   Subjective Assessment - 02/16/21 1410     Subjective Patient reports she is doing alright today. No significant changes since previous session.  She has been walking around the house using her prosthetic and SPC; she has not attempted walking on uneven surfaces (using RW) without the assist of a PT. States she took an Oxycodone earlier today due to shoulder pain. Her phatom limb pain has improved significantly, especially at night. No specific questions or concerns.    Pertinent History Pt has a history of RLE vascular issues since 1991. 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 She received her RLE prosthetic in April 2022 and has been receiving [facility] since her surgery. They discharged her yesterday to continue with OP PT. She continues with phantom RLE pain, especially at night and takes gabapentin with minimal relief. She has also been having R shoulder pain since the surgery which she attributes to using a wheelchair. She underwent R RTC surgery in September of 2021 and had physical therapy afterwards. She has been having issues with bilateral carpal tunnel and has a NCV study scheduled for tomorrow.    Limitations Walking    Patient Stated Goals Ambulate with her prosthetic    Currently in Pain? No/denies    Pain Onset More than a month ago                 TREATMENT     Ther-ex  RLE prosthetic donned: NuStep L0-4 x 5 minutes with BLE only during history, PT monitoring fatigue and assisting with placement/adjustment of RLE;   With prosthetic doffed: Supine R SLR hip  flexion with manual resistance from therapist 2 x 20; Supine RLE bridge with residual RLE resting on stool (towel on top for cushion), 3s hold, arms at side, 2 x 10; Supine LLE single leg bridge 2 x 10; Supine RLE long axis external and internal rotation 2 x 15 each; L sidelying R hip abduction with manual resistance 2 x 15; L sidelying L hip adduction with manual resistance 2 x 15; L sidelying L hip extension from 90 hip flexion to available end range extension with manual resistance 2 x 15; R sidelying L hip abduction with manual resistance 2 x 15; R sidelying R hip adduction with manual resistance 2 x 15; Seated on BOSU (round side up), RLE off floor, 4# med ball diagonal lifts from low to high x 10 on each side with therapist providing CGA/minA+1 to stabilize;     Gait Training Forward gait, 2 laps throughout gym with SPC in LUE, L step length and R foot placement improved with visual cues of mirror; Gait outside using  RW across mulch, grass, and concrete including one curb navigation (descend), no cues for proper sequencing. Therapist providing mostly SUP with occasional CGA in the grass.     Pt educated throughout session about proper posture and technique with exercises. Improved exercise technique, movement at target joints, use of target muscles after min to mod verbal, visual, tactile cues.      Patient demonstrates excellent motivation during session today.  Progressed NuStep, increasing by 1 level every minute. Continued mat table strengthening today during session. Reverted to RW for gait training outside across mulch, grass, and concrete as well as curb navigation due to decreased steadiness during indoor gait with SPC; MIN-MOD A required on 2 occurences during indoor gait using SCP due to decreased right foot clearance. Education on alignment of right step to avoid circumduction/abduction of step. HEP reviewed today. Patient encouraged to follow-up as scheduled. Pt will benefit  from PT services to address deficits in strength, balance, and mobility in order to return to full function at home.           PT Short Term Goals - 01/27/21 1603       PT SHORT TERM GOAL #1   Title Pt will be independent with HEP in order to improve strength and balance in order to decrease fall risk and improve function at home.    Time 4    Period Weeks    Status New    Target Date 02/23/21               PT Long Term Goals - 01/27/21 1606       PT LONG TERM GOAL #1   Title Pt will improve BERG by at least 3 points in order to demonstrate clinically significant improvement in balance.    Baseline 01/26/21: 45/56    Time 8    Period Weeks    Status New    Target Date 03/23/21      PT LONG TERM GOAL #2   Title Pt will improve ABC by at least 13% in order to demonstrate clinically significant improvement in balance confidence.    Baseline 01/26/21: 57.5%    Time 8    Period Weeks    Status New    Target Date 03/23/21      PT LONG TERM GOAL #3   Title Pt will improve FOTO to at least 63 in order to demonstrate clinically significant improvement in function related to her RLE amputation    Baseline 01/26/21: 59    Time 8    Period Weeks    Status New    Target Date 03/23/21      PT LONG TERM GOAL #4   Title Pt will increase self-selected 10MWT by at least 0.13 m/s in order to demonstrate clinically significant improvement in community ambulation.    Baseline 01/26/21: 31.5s = 0.32 m/s    Time 8    Period Weeks    Status New    Target Date 03/23/21                   Plan - 02/16/21 1512     Clinical Impression Statement Patient demonstrates excellent motivation during session today.  Progressed NuStep, increasing by 1 level every minute. Continued mat table strengthening today during session. Reverted to RW for gait training outside across mulch, grass, and concrete as well as curb navigation due to decreased steadiness during indoor gait with SPC; MIN-MOD  A required on 2 occurences during  indoor gait using SCP due to decreased right foot clearance. Education on alignment of right step to avoid circumduction/abduction of step. HEP reviewed today. Patient encouraged to follow-up as scheduled. Pt will benefit from PT services to address deficits in strength, balance, and mobility in order to return to full function at home.    Personal Factors and Comorbidities Age;Comorbidity 3+    Comorbidities Depression, osteoporosis, vascular disease    Examination-Activity Limitations Bathing;Locomotion Level;Squat;Stairs;Stand;Transfers    Examination-Participation Restrictions Cleaning;Community Activity;Meal Prep;Yard Work    Stability/Clinical Decision Making Unstable/Unpredictable    Rehab Potential Good    PT Frequency 2x / week    PT Duration 12 weeks    PT Treatment/Interventions ADLs/Self Care Home Management;Aquatic Therapy;Canalith Repostioning;Cryotherapy;Electrical Stimulation;Iontophoresis 4mg /ml Dexamethasone;Moist Heat;Traction;Ultrasound;Gait training;Stair training;Therapeutic activities;Therapeutic exercise;Neuromuscular re-education;Manual techniques;Dry needling;Vestibular;Spinal Manipulations;Joint Manipulations    PT Next Visit Plan Practice gait training, issue HEP, strengthening, prone hip flexor stretch    PT Home Exercise Plan Access Code: [email or web address]    Consulted and Agree with Plan of Care Patient             Patient will benefit from skilled therapeutic intervention in order to improve the following deficits and impairments:  Abnormal gait, Decreased strength, Difficulty walking  Visit Diagnosis: Muscle weakness (generalized)  Difficulty in walking, not elsewhere classified  Unsteadiness on feet     Problem List Patient Active Problem List   Diagnosis Date Noted   Muscle spasm 09/09/2020   S/P AKA (above knee amputation), right ([place]) 09/08/2020   Ischemia of extremity 07/02/2020   Chronic pain in right shoulder  11/25/2019   Encounter for long-term (current) use of high-risk medication 07/16/2019   GCA (giant cell arteritis) ([place]) 07/16/2019   Temporal arteritis ([place]) 07/13/2019   Postoperative wound infection 06/23/2019   Ischemic leg 05/31/2019   Atherosclerosis of native arteries of extremity with intermittent claudication ([place]) 05/30/2019   Depression 05/29/2019   Eczema of lower extremity 03/04/2018   Chronic venous insufficiency 03/02/2018   Personal history of colonic polyps    Family history of colonic polyps    Benign neoplasm of ascending colon    Mitral valve insufficiency 02/08/2017   Dyspnea on exertion 02/07/2017   Non-rheumatic mitral regurgitation 02/07/2017   Precordial pain 02/07/2017   CAD (coronary artery disease) 12/21/2016   Essential hypertension 12/21/2016   Compression fracture of lumbar spine, non-traumatic ([place]) 04/26/2016   Compression fracture of L3 lumbar vertebra 04/20/2016   PVD (peripheral vascular disease) ([place]) 02/24/2016   Family history of premature CAD 02/24/2016   Gastritis    Other specified diseases of esophagus    Hiatal hernia    Gastritis and gastroduodenitis    Ischemia of lower extremity    Arterial occlusion (H[facility])    Atherosclerosis of aorta ([place])    History of smoking    Hyperlipidemia    Pain in the chest    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 02/05/2016   Carotid artery stenosis 01/04/2016   Vertigo, benign paroxysmal 12/28/2015   Clinical depression 09/06/2015   History of alcoholism ([place]) 09/06/2015   Angiopathy, 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[place]) 09/06/2015   Atherosclerosis of native arteries of extremity with rest pain ([place]) 09/06/2015   Acute non-recurrent maxillary sinusitis 07/14/2015   Vaginal pruritus 05/26/2015   Chronic recurrent major depressive disorder ([place]) 03/09/2015   Osteoporosis, post-menopausal 03/09/2015   Peripheral blood vessel disorder ([place]) 03/09/2015   Acid reflux 03/09/2015   GERD  (gastroesophageal reflux disease) 12/21/2014   Carotid artery narrowing  01/28/2014   Peripheral arterial occlusive disease ([place]) 06/08/2011   Occlusion and stenosis of unspecified carotid artery 06/08/2011   PAD (peripheral artery disease) ([place]) 06/08/2011   Patrina Levering PT, DPT  [facility] 02/16/2021, 3:21 PM  [facility] [address] [place], [place], [address] Phone: [phone]   Fax:  [phone]  Name: Sara [name] MRN: [id number] Date of Birth: [birth year]

## 2021-02-18 ENCOUNTER — Other Ambulatory Visit: Payer: Self-pay

## 2021-02-18 ENCOUNTER — Ambulatory Visit: Payer: Medicare Other | Attending: Vascular Surgery | Admitting: Physical Therapy

## 2021-02-18 DIAGNOSIS — R262 Difficulty in walking, not elsewhere classified: Secondary | ICD-10-CM

## 2021-02-18 DIAGNOSIS — R2681 Unsteadiness on feet: Secondary | ICD-10-CM

## 2021-02-18 DIAGNOSIS — M6281 Muscle weakness (generalized): Secondary | ICD-10-CM | POA: Diagnosis not present

## 2021-02-18 NOTE — Therapy (Signed)
[facility] [address] [place], [place], [address] Phone: [phone]   Fax:  [phone]  Physical Therapy Treatment  Patient Details  Name: Sara [name] MRN: [id number] Date of Birth: [birth year] Referring Provider (PT): Dr. Delana Meyer   Encounter Date: 02/18/2021   PT End of Session - 02/18/21 1209     Visit Number 7    Number of Visits 25    Date for PT Re-Evaluation 04/20/21    Authorization Type eval: 01/26/21    PT Start Time 1012    PT Stop Time 1100    PT Time Calculation (min) 48 min    Equipment Utilized During Treatment Gait belt    Activity Tolerance Patient tolerated treatment well    Behavior During Therapy [facility] for tasks assessed/performed             Past Medical History:  Diagnosis Date   Arthritis    right shoulder   Depression    Dyspnea    GERD (gastroesophageal reflux disease)    History of blood clots    Hyperlipidemia    Hypertension    Mild mitral regurgitation    Osteoporosis    Peripheral vascular disease ([place])    Stomach ulcer    Vascular disease    Sees Dr. Delana Meyer   Vertigo    Last episode approx Aug 2015   Wears dentures    full upper    Past Surgical History:  Procedure Laterality Date   ADENOIDECTOMY     AMPUTATION Right 08/11/2020   Procedure: AMPUTATION ABOVE KNEE;  Surgeon: [name], MD;  Location: [facility];  Service: Vascular;  Laterality: Right;   ARTERY BIOPSY Right 07/14/2019   Procedure: BIOPSY TEMPORAL ARTERY;  Surgeon: [name], MD;  Location: [facility];  Service: Vascular;  Laterality: Right;   BREAST CYST ASPIRATION Left    CARDIAC CATHETERIZATION  02/15/2017   [facility]   COLONOSCOPY WITH PROPOFOL N/A 10/22/2017   Procedure: COLONOSCOPY WITH PROPOFOL;  Surgeon: Lucilla Lame, MD;  Location: [place];  Service: Endoscopy;  Laterality: N/A;  specimens not taken--pt on Plavix will be brought back in after 7 days off med   COLONOSCOPY WITH  PROPOFOL N/A 11/05/2017   Procedure: COLONOSCOPY WITH PROPOFOL;  Surgeon: Lucilla Lame, MD;  Location: [place];  Service: Endoscopy;  Laterality: N/A;   ESOPHAGOGASTRODUODENOSCOPY N/A 12/21/2014   Procedure: ESOPHAGOGASTRODUODENOSCOPY (EGD);  Surgeon: Lucilla Lame, MD;  Location: [employer];  Service: Gastroenterology;  Laterality: N/A;   ESOPHAGOGASTRODUODENOSCOPY (EGD) WITH PROPOFOL N/A 02/08/2016   Procedure: ESOPHAGOGASTRODUODENOSCOPY (EGD) WITH PROPOFOL;  Surgeon: Lucilla Lame, MD;  Location: [facility];  Service: Endoscopy;  Laterality: N/A;   FASCIOTOMY Right 05/30/2019   Procedure: FASCIOTOMY;  Surgeon: [name], MD;  Location: [facility];  Service: Vascular;  Laterality: Right;   FASCIOTOMY CLOSURE Right 06/04/2019   Procedure: FASCIOTOMY CLOSURE;  Surgeon: [name], MD;  Location: [facility];  Service: Vascular;  Laterality: Right;   HEMORROIDECTOMY  2014   LOWER EXTREMITY ANGIOGRAPHY Right 05/30/2019   Procedure: LOWER EXTREMITY ANGIOGRAPHY;  Surgeon: [name], MD;  Location: [place] [facility];  Service: Cardiovascular;  Laterality: Right;   LOWER EXTREMITY ANGIOGRAPHY Right 07/02/2020   Procedure: LOWER EXTREMITY ANGIOGRAPHY;  Surgeon: [name], MD;  Location: [place] [facility];  Service: Cardiovascular;  Laterality: Right;   PERIPHERAL VASCULAR CATHETERIZATION N/A 02/09/2016   Procedure: Abdominal Aortogram w/Lower Extremity;  Surgeon: Belenda Cruise  Eloise Levels, MD;  Location: [place] [facility];  Service: Cardiovascular;  Laterality: N/A;   PERIPHERAL VASCULAR CATHETERIZATION Right 02/10/2016   Procedure: Lower Extremity Angiography;  Surgeon: [name], MD;  Location: [place] [facility];  Service: Cardiovascular;  Laterality: Right;   POLYPECTOMY  11/05/2017   Procedure: POLYPECTOMY;  Surgeon: Lucilla Lame, MD;  Location: [place];  Service: Endoscopy;;   SHOULDER ARTHROSCOPY WITH ROTATOR CUFF REPAIR AND  SUBACROMIAL DECOMPRESSION Right 02/27/2020   Procedure: RIGHT SHOULDER ARTHROSCOPY SUBACROMIAL DECOMPRESSION, DISTAL CLAVICLE EXCISION AND MINI-OPEN ROTATOR CUFF REPAIR;  Surgeon: Thornton Park, MD;  Location: [facility];  Service: Orthopedics;  Laterality: Right;   TONSILLECTOMY     VASCULAR SURGERY  [id number]   Fem-Pop Bypass    There were no vitals filed for this visit.   Subjective Assessment - 02/18/21 1016     Subjective Patient reports she is doing well today. She states her anterior thigh was sore (muscular sorness). She continues to practice walking using a SPC around the house. Her phatom limb pain has improved significantly, especially at night. No specific questions or concerns.    Pertinent History Pt has a history of RLE vascular issues since 1991. 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 She received her RLE prosthetic in April 2022 and has been receiving [facility] since her surgery. They discharged her yesterday to continue with OP PT. She continues with phantom RLE pain, especially at night and takes gabapentin with minimal relief. She has also been having R shoulder pain since the surgery which she attributes to using a wheelchair. She underwent R RTC surgery in September of 2021 and had physical therapy afterwards. She has been having issues with bilateral carpal tunnel and has a NCV study scheduled for tomorrow.    Limitations Walking    Patient Stated Goals Ambulate with her prosthetic    Currently in Pain? No/denies    Pain Onset More than a month ago             TREATMENT     Ther-ex  RLE prosthetic donned: NuStep L0-2 x 5 minutes with BLE only during history, PT monitoring fatigue and assisting with placement/adjustment of RLE;   With prosthetic doffed: Supine R SLR hip flexion with manual resistance from therapist 2 x 10; Supine BLE hip extension with red log under distal thighs 2 x 10;  L sidelying R hip abduction with  manual resistance 2 x 15; L sidelying L hip adduction with manual resistance 2 x 15;  Neuromuscular re-ed Weight shifting in // bars with UE support, feet shoulder width 2 x 15 SLS in // bars with UE support, RLE only 10 x 3-10 seconds (10s was longest bout); cueing to shift COM over right foot     Gait Training Forward gait holding SPC in LUE, throughout gym and down hallway.  150-268ft x3 reps. Focus on rightward weight shift to offload pressure through cane in LUE. PT assisted with weight shift at hips to bring COM over R foot. Also cued to decrease RLE stride length and increase left to create equal strides. Some visual feedback utilized with mirror; demonstrations also provided by PT.   Pt educated throughout session about proper posture and technique with exercises. Improved exercise technique, movement at target joints, use of target muscles after min to mod verbal, visual, tactile cues.      Patient demonstrates excellent motivation during session today. Started session with gait training  after warming up on NuStep. Gait training performed with SPC and focused on equivalent stride length and weight shifting onto the RLE during stance phase. Gait pattern improved however was inconsistent and required constant cueing. Demonstrations were provided to educate patient on why specific modifications were being made. PT referenced to patients ultimate goal of ambulating without an AD to emphasize the importance of weight shifting to the right. With remaining time, patient continued mat table strengthening. Patient educated to perform HEP later today as not all therapeutic exercises were completed during session. Patient encouraged to follow-up as scheduled. Pt will benefit from PT services to address deficits in strength, balance, and mobility in order to return to full function at home.          PT Short Term Goals - 01/27/21 1603       PT SHORT TERM GOAL #1   Title Pt will be independent  with HEP in order to improve strength and balance in order to decrease fall risk and improve function at home.    Time 4    Period Weeks    Status New    Target Date 02/23/21               PT Long Term Goals - 01/27/21 1606       PT LONG TERM GOAL #1   Title Pt will improve BERG by at least 3 points in order to demonstrate clinically significant improvement in balance.    Baseline 01/26/21: 45/56    Time 8    Period Weeks    Status New    Target Date 03/23/21      PT LONG TERM GOAL #2   Title Pt will improve ABC by at least 13% in order to demonstrate clinically significant improvement in balance confidence.    Baseline 01/26/21: 57.5%    Time 8    Period Weeks    Status New    Target Date 03/23/21      PT LONG TERM GOAL #3   Title Pt will improve FOTO to at least 63 in order to demonstrate clinically significant improvement in function related to her RLE amputation    Baseline 01/26/21: 59    Time 8    Period Weeks    Status New    Target Date 03/23/21      PT LONG TERM GOAL #4   Title Pt will increase self-selected 10MWT by at least 0.13 m/s in order to demonstrate clinically significant improvement in community ambulation.    Baseline 01/26/21: 31.5s = 0.32 m/s    Time 8    Period Weeks    Status New    Target Date 03/23/21                   Plan - 02/18/21 1210     Clinical Impression Statement Patient demonstrates excellent motivation during session today. Started session with gait training after warming up on NuStep. Gait training performed with SPC and focused on equivalent stride length and weight shifting onto the RLE during stance phase. Gait pattern improved however was inconsistent and required constant cueing. Demonstrations were provided to educate patient on why specific modifications were being made. PT referenced to patients ultimate goal of ambulating without an AD to emphasize the importance of weight shifting to the right. With remaining time,  patient continued mat table strengthening. Patient educated to perform HEP later today as not all therapeutic exercises were completed during session. Patient encouraged to follow-up  as scheduled. Pt will benefit from PT services to address deficits in strength, balance, and mobility in order to return to full function at home.    Personal Factors and Comorbidities Age;Comorbidity 3+    Comorbidities Depression, osteoporosis, vascular disease    Examination-Activity Limitations Bathing;Locomotion Level;Squat;Stairs;Stand;Transfers    Examination-Participation Restrictions Cleaning;Community Activity;Meal Prep;Yard Work    Stability/Clinical Decision Making Unstable/Unpredictable    Rehab Potential Good    PT Frequency 2x / week    PT Duration 12 weeks    PT Treatment/Interventions ADLs/Self Care Home Management;Aquatic Therapy;Canalith Repostioning;Cryotherapy;Electrical Stimulation;Iontophoresis 4mg /ml Dexamethasone;Moist Heat;Traction;Ultrasound;Gait training;Stair training;Therapeutic activities;Therapeutic exercise;Neuromuscular re-education;Manual techniques;Dry needling;Vestibular;Spinal Manipulations;Joint Manipulations    PT Next Visit Plan Practice gait training, issue HEP, strengthening, prone hip flexor stretch    PT Home Exercise Plan Access Code: [email or web address]    Consulted and Agree with Plan of Care Patient             Patient will benefit from skilled therapeutic intervention in order to improve the following deficits and impairments:  Abnormal gait, Decreased strength, Difficulty walking  Visit Diagnosis: Muscle weakness (generalized)  Difficulty in walking, not elsewhere classified  Unsteadiness on feet     Problem List Patient Active Problem List   Diagnosis Date Noted   Muscle spasm 09/09/2020   S/P AKA (above knee amputation), right ([place]) 09/08/2020   Ischemia of extremity 07/02/2020   Chronic pain in right shoulder 11/25/2019   Encounter for long-term (current)  use of high-risk medication 07/16/2019   GCA (giant cell arteritis) ([place]) 07/16/2019   Temporal arteritis ([place]) 07/13/2019   Postoperative wound infection 06/23/2019   Ischemic leg 05/31/2019   Atherosclerosis of native arteries of extremity with intermittent claudication ([place]) 05/30/2019   Depression 05/29/2019   Eczema of lower extremity 03/04/2018   Chronic venous insufficiency 03/02/2018   Personal history of colonic polyps    Family history of colonic polyps    Benign neoplasm of ascending colon    Mitral valve insufficiency 02/08/2017   Dyspnea on exertion 02/07/2017   Non-rheumatic mitral regurgitation 02/07/2017   Precordial pain 02/07/2017   CAD (coronary artery disease) 12/21/2016   Essential hypertension 12/21/2016   Compression fracture of lumbar spine, non-traumatic ([place]) 04/26/2016   Compression fracture of L3 lumbar vertebra 04/20/2016   PVD (peripheral vascular disease) ([place]) 02/24/2016   Family history of premature CAD 02/24/2016   Gastritis    Other specified diseases of esophagus    Hiatal hernia    Gastritis and gastroduodenitis    Ischemia of lower extremity    Arterial occlusion ([facility])    Atherosclerosis of aorta ([place])    History of smoking    Hyperlipidemia    Pain in the chest    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 02/05/2016   Carotid artery stenosis 01/04/2016   Vertigo, benign paroxysmal 12/28/2015   Clinical depression 09/06/2015   History of alcoholism ([place]) 09/06/2015   Angiopathy, 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[place]) 09/06/2015   Atherosclerosis of native arteries of extremity with rest pain ([place]) 09/06/2015   Acute non-recurrent maxillary sinusitis 07/14/2015   Vaginal pruritus 05/26/2015   Chronic recurrent major depressive disorder ([place]) 03/09/2015   Osteoporosis, post-menopausal 03/09/2015   Peripheral blood vessel disorder ([place]) 03/09/2015   Acid reflux 03/09/2015   GERD (gastroesophageal reflux disease) 12/21/2014   Carotid  artery narrowing 01/28/2014   Peripheral arterial occlusive disease ([place]) 06/08/2011   Occlusion and stenosis of unspecified carotid artery 06/08/2011   PAD (peripheral artery disease) ([place]) 06/08/2011  Patrina Levering PT, DPT  [facility] 02/18/2021, 12:33 PM  [place] [facility] [address] [place], [place], [address] Phone: [phone]   Fax:  [phone]  Name: Sara [name] MRN: [id number] Date of Birth: 1951/04/23

## 2021-02-23 ENCOUNTER — Ambulatory Visit: Payer: Medicare Other

## 2021-02-23 ENCOUNTER — Other Ambulatory Visit: Payer: Self-pay

## 2021-02-23 DIAGNOSIS — M6281 Muscle weakness (generalized): Secondary | ICD-10-CM | POA: Diagnosis not present

## 2021-02-23 DIAGNOSIS — R262 Difficulty in walking, not elsewhere classified: Secondary | ICD-10-CM

## 2021-02-23 NOTE — Therapy (Signed)
[facility] [address] [place], [place], [address] Phone: [phone]   Fax:  [phone]  Physical Therapy Treatment  Patient Details  Name: Sara [name] MRN: [id number] Date of Birth: 05/04/1951 Referring Provider (PT): Dr. Delana Meyer   Encounter Date: 02/23/2021   PT End of Session - 02/23/21 1034     Visit Number 8    Number of Visits 25    Date for PT Re-Evaluation 04/20/21    Authorization Type eval: 01/26/21    PT Start Time 0933    PT Stop Time 1015    PT Time Calculation (min) 42 min    Equipment Utilized During Treatment Gait belt    Activity Tolerance Patient tolerated treatment well    Behavior During Therapy [facility] for tasks assessed/performed             Past Medical History:  Diagnosis Date   Arthritis    right shoulder   Depression    Dyspnea    GERD (gastroesophageal reflux disease)    History of blood clots    Hyperlipidemia    Hypertension    Mild mitral regurgitation    Osteoporosis    Peripheral vascular disease ([place])    Stomach ulcer    Vascular disease    Sees Dr. Delana Meyer   Vertigo    Last episode approx Aug 2015   Wears dentures    full upper    Past Surgical History:  Procedure Laterality Date   ADENOIDECTOMY     AMPUTATION Right 08/11/2020   Procedure: AMPUTATION ABOVE KNEE;  Surgeon: [name], MD;  Location: [facility];  Service: Vascular;  Laterality: Right;   ARTERY BIOPSY Right 07/14/2019   Procedure: BIOPSY TEMPORAL ARTERY;  Surgeon: [name], MD;  Location: [facility];  Service: Vascular;  Laterality: Right;   BREAST CYST ASPIRATION Left    CARDIAC CATHETERIZATION  02/15/2017   [facility]   COLONOSCOPY WITH PROPOFOL N/A 10/22/2017   Procedure: COLONOSCOPY WITH PROPOFOL;  Surgeon: Lucilla Lame, MD;  Location: [place];  Service: Endoscopy;  Laterality: N/A;  specimens not taken--pt on Plavix will be brought back in after 7 days off med   COLONOSCOPY WITH  PROPOFOL N/A 11/05/2017   Procedure: COLONOSCOPY WITH PROPOFOL;  Surgeon: Lucilla Lame, MD;  Location: [place];  Service: Endoscopy;  Laterality: N/A;   ESOPHAGOGASTRODUODENOSCOPY N/A 12/21/2014   Procedure: ESOPHAGOGASTRODUODENOSCOPY (EGD);  Surgeon: Lucilla Lame, MD;  Location: [place];  Service: Gastroenterology;  Laterality: N/A;   ESOPHAGOGASTRODUODENOSCOPY (EGD) WITH PROPOFOL N/A 02/08/2016   Procedure: ESOPHAGOGASTRODUODENOSCOPY (EGD) WITH PROPOFOL;  Surgeon: Lucilla Lame, MD;  Location: [facility];  Service: Endoscopy;  Laterality: N/A;   FASCIOTOMY Right 05/30/2019   Procedure: FASCIOTOMY;  Surgeon: [name], MD;  Location: [facility];  Service: Vascular;  Laterality: Right;   FASCIOTOMY CLOSURE Right 06/04/2019   Procedure: FASCIOTOMY CLOSURE;  Surgeon: [name], MD;  Location: [facility];  Service: Vascular;  Laterality: Right;   HEMORROIDECTOMY  2014   LOWER EXTREMITY ANGIOGRAPHY Right 05/30/2019   Procedure: LOWER EXTREMITY ANGIOGRAPHY;  Surgeon: [name], MD;  Location: [place] [facility];  Service: Cardiovascular;  Laterality: Right;   LOWER EXTREMITY ANGIOGRAPHY Right 07/02/2020   Procedure: LOWER EXTREMITY ANGIOGRAPHY;  Surgeon: [name], MD;  Location: [place] [facility];  Service: Cardiovascular;  Laterality: Right;   PERIPHERAL VASCULAR CATHETERIZATION N/A 02/09/2016   Procedure: Abdominal Aortogram w/Lower Extremity;  Surgeon: Belenda Cruise  Eloise Levels, MD;  Location: [place] [facility];  Service: Cardiovascular;  Laterality: N/A;   PERIPHERAL VASCULAR CATHETERIZATION Right 02/10/2016   Procedure: Lower Extremity Angiography;  Surgeon: [name], MD;  Location: [place] [facility];  Service: Cardiovascular;  Laterality: Right;   POLYPECTOMY  11/05/2017   Procedure: POLYPECTOMY;  Surgeon: Lucilla Lame, MD;  Location: [place];  Service: Endoscopy;;   SHOULDER ARTHROSCOPY WITH ROTATOR CUFF REPAIR AND  SUBACROMIAL DECOMPRESSION Right 02/27/2020   Procedure: RIGHT SHOULDER ARTHROSCOPY SUBACROMIAL DECOMPRESSION, DISTAL CLAVICLE EXCISION AND MINI-OPEN ROTATOR CUFF REPAIR;  Surgeon: Thornton Park, MD;  Location: [facility];  Service: Orthopedics;  Laterality: Right;   TONSILLECTOMY     VASCULAR SURGERY  [id number]   Fem-Pop Bypass    There were no vitals filed for this visit.   Subjective Assessment - 02/23/21 1033     Subjective Patient reports she is doing well today. She continues to practice walking using a SPC around the house and had a near fall the ohter day. Her phatom limb pain has been bothering her this morning but no resting pain upon arrival today. No specific questions or concerns.    Pertinent History Pt has a history of RLE vascular issues since 1991. 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 She received her RLE prosthetic in April 2022 and has been receiving [facility] since her surgery. They discharged her yesterday to continue with OP PT. She continues with phantom RLE pain, especially at night and takes gabapentin with minimal relief. She has also been having R shoulder pain since the surgery which she attributes to using a wheelchair. She underwent R RTC surgery in September of 2021 and had physical therapy afterwards. She has been having issues with bilateral carpal tunnel and has a NCV study scheduled for tomorrow.    Limitations Walking    Patient Stated Goals Ambulate with her prosthetic               TREATMENT     Ther-ex  With prosthetic doffed: Supine R SLR hip flexion with manual resistance from therapist 2 x 20; Supine RLE bridge with residual RLE resting on stool (towel on top for cushion), 3s hold, arms at side, 2 x 10 (first set with arms at side and second set with arms crossed); Supine RLE long axis manually resisted external and internal rotation 2 x 20 each; L sidelying R hip abduction with manual resistance 2 x  20; L sidelying R hip extension from 90 hip flexion to available end range extension with manual resistance 2 x 20; L sidelying R hip flexor stretch 30s x 2; Pt educated and performed supine Thomas test R hip flexor stretch off edge of table x 30s;     Gait Training Forward gait in throughout gym and hallway with single point cane in LUE, improved L step length and R toe off noted today however pt does have one episode where she catches her RLE prosthetic forefoot during swing and requires modA+1 from therapist to prevent her from falling. Ambulation approximately 250' inside the gym/hallway. Incorporated dual tasking with alphabet names and pt does slow her gait speed and struggle more with sequencing when cognitive task is added. Gait outside using single point cane across grass and concrete including one curb navigation (descend), no cues for proper sequencing. Included dual cognitive task as well when walking outside. Therapist providing mostly [place] however does have to provide modA+1 to prevent fall  during one LOB again outside.      Pt educated throughout session about proper posture and technique with exercises. Improved exercise technique, movement at target joints, use of target muscles after min to mod verbal, visual, tactile cues.      Patient demonstrates excellent motivation during session today. Continued mat table strengthening today during session. Also progressed gait training with single point cane and pt is demonstrating improved L step length and R toe off noted today however pt does have one episode where she catches her RLE prosthetic forefoot during swing and requires modA+1 from therapist to prevent her from falling. Incorporated dual tasking with alphabet names and pt does slow her gait speed and struggle more with sequencing when cognitive task is added. Repeated gait outside using single point cane across grass and concrete including one curb navigation (descend). When walking  outside therapist provided mostly [place] however does have to provide modA+1 to prevent fall during one LOB outside as well. No HEP progression today. Patient encouraged to follow-up as scheduled. Pt will benefit from PT services to address deficits in strength, balance, and mobility in order to return to full function at home.                             PT Short Term Goals - 01/27/21 1603       PT SHORT TERM GOAL #1   Title Pt will be independent with HEP in order to improve strength and balance in order to decrease fall risk and improve function at home.    Time 4    Period Weeks    Status New    Target Date 02/23/21               PT Long Term Goals - 01/27/21 1606       PT LONG TERM GOAL #1   Title Pt will improve BERG by at least 3 points in order to demonstrate clinically significant improvement in balance.    Baseline 01/26/21: 45/56    Time 8    Period Weeks    Status New    Target Date 03/23/21      PT LONG TERM GOAL #2   Title Pt will improve ABC by at least 13% in order to demonstrate clinically significant improvement in balance confidence.    Baseline 01/26/21: 57.5%    Time 8    Period Weeks    Status New    Target Date 03/23/21      PT LONG TERM GOAL #3   Title Pt will improve FOTO to at least 63 in order to demonstrate clinically significant improvement in function related to her RLE amputation    Baseline 01/26/21: 59    Time 8    Period Weeks    Status New    Target Date 03/23/21      PT LONG TERM GOAL #4   Title Pt will increase self-selected 10MWT by at least 0.13 m/s in order to demonstrate clinically significant improvement in community ambulation.    Baseline 01/26/21: 31.5s = 0.32 m/s    Time 8    Period Weeks    Status New    Target Date 03/23/21                   Plan - 02/23/21 1034     Clinical Impression Statement Patient demonstrates excellent motivation during session today. Continued mat table  strengthening today  during session. Also progressed gait training with single point cane and pt is demonstrating improved L step length and R toe off noted today however pt does have one episode where she catches her RLE prosthetic forefoot during swing and requires modA+1 from therapist to prevent her from falling. Incorporated dual tasking with alphabet names and pt does slow her gait speed and struggle more with sequencing when cognitive task is added. Repeated gait outside using single point cane across grass and concrete including one curb navigation (descend). When walking outside therapist provided mostly [place] however does have to provide modA+1 to prevent fall during one LOB outside as well. No HEP progression today. Patient encouraged to follow-up as scheduled. Pt will benefit from PT services to address deficits in strength, balance, and mobility in order to return to full function at home.    Personal Factors and Comorbidities Age;Comorbidity 3+    Comorbidities Depression, osteoporosis, vascular disease    Examination-Activity Limitations Bathing;Locomotion Level;Squat;Stairs;Stand;Transfers    Examination-Participation Restrictions Cleaning;Community Activity;Meal Prep;Yard Work    Stability/Clinical Decision Making Unstable/Unpredictable    Rehab Potential Good    PT Frequency 2x / week    PT Duration 12 weeks    PT Treatment/Interventions ADLs/Self Care Home Management;Aquatic Therapy;Canalith Repostioning;Cryotherapy;Electrical Stimulation;Iontophoresis 4mg /ml Dexamethasone;Moist Heat;Traction;Ultrasound;Gait training;Stair training;Therapeutic activities;Therapeutic exercise;Neuromuscular re-education;Manual techniques;Dry needling;Vestibular;Spinal Manipulations;Joint Manipulations    PT Next Visit Plan Practice gait training, issue HEP, strengthening, prone hip flexor stretch    PT Home Exercise Plan Access Code: [email or web address]    Consulted and Agree with Plan of Care Patient              Patient will benefit from skilled therapeutic intervention in order to improve the following deficits and impairments:  Abnormal gait, Decreased strength, Difficulty walking  Visit Diagnosis: Muscle weakness (generalized)  Difficulty in walking, not elsewhere classified     Problem List Patient Active Problem List   Diagnosis Date Noted   Muscle spasm 09/09/2020   S/P AKA (above knee amputation), right ([place]) 09/08/2020   Ischemia of extremity 07/02/2020   Chronic pain in right shoulder 11/25/2019   Encounter for long-term (current) use of high-risk medication 07/16/2019   GCA (giant cell arteritis) ([place]) 07/16/2019   Temporal arteritis ([place]) 07/13/2019   Postoperative wound infection 06/23/2019   Ischemic leg 05/31/2019   Atherosclerosis of native arteries of extremity with intermittent claudication ([place]) 05/30/2019   Depression 05/29/2019   Eczema of lower extremity 03/04/2018   Chronic venous insufficiency 03/02/2018   Personal history of colonic polyps    Family history of colonic polyps    Benign neoplasm of ascending colon    Mitral valve insufficiency 02/08/2017   Dyspnea on exertion 02/07/2017   Non-rheumatic mitral regurgitation 02/07/2017   Precordial pain 02/07/2017   CAD (coronary artery disease) 12/21/2016   Essential hypertension 12/21/2016   Compression fracture of lumbar spine, non-traumatic ([place]) 04/26/2016   Compression fracture of L3 lumbar vertebra 04/20/2016   PVD (peripheral vascular disease) ([place]) 02/24/2016   Family history of premature CAD 02/24/2016   Gastritis    Other specified diseases of esophagus    Hiatal hernia    Gastritis and gastroduodenitis    Ischemia of lower extremity    Arterial occlusion (H[facility])    Atherosclerosis of aorta (H[facility])    History of smoking    Hyperlipidemia    Pain in the chest    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 02/05/2016   Carotid artery stenosis 01/04/2016   Vertigo,  benign  paroxysmal 12/28/2015   Clinical depression 09/06/2015   History of alcoholism ([place]) 09/06/2015   Angiopathy, 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[place]) 09/06/2015   Atherosclerosis of native arteries of extremity with rest pain ([place]) 09/06/2015   Acute non-recurrent maxillary sinusitis 07/14/2015   Vaginal pruritus 05/26/2015   Chronic recurrent major depressive disorder ([place]) 03/09/2015   Osteoporosis, post-menopausal 03/09/2015   Peripheral blood vessel disorder ([place]) 03/09/2015   Acid reflux 03/09/2015   GERD (gastroesophageal reflux disease) 12/21/2014   Carotid artery narrowing 01/28/2014   Peripheral arterial occlusive disease ([place]) 06/08/2011   Occlusion and stenosis of unspecified carotid artery 06/08/2011   PAD (peripheral artery disease) ([place]) 06/08/2011   Phillips Grout PT, DPT, GCS  Stephon Weathers 02/23/2021, 10:41 AM  [facility] [address] [place], [place], [address] Phone: [phone]   Fax:  [phone]  Name: Sara [name] MRN: [id number] Date of Birth: Aug 20, 1951

## 2021-02-24 ENCOUNTER — Ambulatory Visit: Payer: Medicare Other

## 2021-02-24 DIAGNOSIS — M6281 Muscle weakness (generalized): Secondary | ICD-10-CM | POA: Diagnosis not present

## 2021-02-24 DIAGNOSIS — R262 Difficulty in walking, not elsewhere classified: Secondary | ICD-10-CM

## 2021-02-24 NOTE — Therapy (Signed)
[facility] [address] [place], [place], [address] Phone: [phone]   Fax:  [phone]  Physical Therapy Treatment  Patient Details  Name: Sara [name] MRN: [id number] Date of Birth: 06-05-1951 Referring Provider (PT): Dr. Delana Meyer   Encounter Date: 02/24/2021   PT End of Session - 02/24/21 1505     Visit Number 9    Number of Visits 25    Date for PT Re-Evaluation 04/20/21    Authorization Type eval: 01/26/21    PT Start Time 1505    PT Stop Time 1550    PT Time Calculation (min) 45 min    Equipment Utilized During Treatment Gait belt    Activity Tolerance Patient tolerated treatment well    Behavior During Therapy [facility] for tasks assessed/performed              Past Medical History:  Diagnosis Date   Arthritis    right shoulder   Depression    Dyspnea    GERD (gastroesophageal reflux disease)    History of blood clots    Hyperlipidemia    Hypertension    Mild mitral regurgitation    Osteoporosis    Peripheral vascular disease ([place])    Stomach ulcer    Vascular disease    Sees Dr. Delana Meyer   Vertigo    Last episode approx Aug 2015   Wears dentures    full upper    Past Surgical History:  Procedure Laterality Date   ADENOIDECTOMY     AMPUTATION Right 08/11/2020   Procedure: AMPUTATION ABOVE KNEE;  Surgeon: [name], MD;  Location: [facility];  Service: Vascular;  Laterality: Right;   ARTERY BIOPSY Right 07/14/2019   Procedure: BIOPSY TEMPORAL ARTERY;  Surgeon: [name], MD;  Location: [facility];  Service: Vascular;  Laterality: Right;   BREAST CYST ASPIRATION Left    CARDIAC CATHETERIZATION  02/15/2017   [facility]   COLONOSCOPY WITH PROPOFOL N/A 10/22/2017   Procedure: COLONOSCOPY WITH PROPOFOL;  Surgeon: Lucilla Lame, MD;  Location: [place];  Service: Endoscopy;  Laterality: N/A;  specimens not taken--pt on Plavix will be brought back in after 7 days off med   COLONOSCOPY WITH  PROPOFOL N/A 11/05/2017   Procedure: COLONOSCOPY WITH PROPOFOL;  Surgeon: Lucilla Lame, MD;  Location: [place];  Service: Endoscopy;  Laterality: N/A;   ESOPHAGOGASTRODUODENOSCOPY N/A 12/21/2014   Procedure: ESOPHAGOGASTRODUODENOSCOPY (EGD);  Surgeon: Lucilla Lame, MD;  Location: [place];  Service: Gastroenterology;  Laterality: N/A;   ESOPHAGOGASTRODUODENOSCOPY (EGD) WITH PROPOFOL N/A 02/08/2016   Procedure: ESOPHAGOGASTRODUODENOSCOPY (EGD) WITH PROPOFOL;  Surgeon: Lucilla Lame, MD;  Location: [facility];  Service: Endoscopy;  Laterality: N/A;   FASCIOTOMY Right 05/30/2019   Procedure: FASCIOTOMY;  Surgeon: [name], MD;  Location: [facility];  Service: Vascular;  Laterality: Right;   FASCIOTOMY CLOSURE Right 06/04/2019   Procedure: FASCIOTOMY CLOSURE;  Surgeon: [name], MD;  Location: [facility];  Service: Vascular;  Laterality: Right;   HEMORROIDECTOMY  2014   LOWER EXTREMITY ANGIOGRAPHY Right 05/30/2019   Procedure: LOWER EXTREMITY ANGIOGRAPHY;  Surgeon: [name], MD;  Location: [place] [facility];  Service: Cardiovascular;  Laterality: Right;   LOWER EXTREMITY ANGIOGRAPHY Right 07/02/2020   Procedure: LOWER EXTREMITY ANGIOGRAPHY;  Surgeon: [name], MD;  Location: [place] [facility];  Service: Cardiovascular;  Laterality: Right;   PERIPHERAL VASCULAR CATHETERIZATION N/A 02/09/2016   Procedure: Abdominal Aortogram w/Lower Extremity;  Surgeon:  [name], MD;  Location: [place] [facility];  Service: Cardiovascular;  Laterality: N/A;   PERIPHERAL VASCULAR CATHETERIZATION Right 02/10/2016   Procedure: Lower Extremity Angiography;  Surgeon: [name], MD;  Location: [place] [facility];  Service: Cardiovascular;  Laterality: Right;   POLYPECTOMY  11/05/2017   Procedure: POLYPECTOMY;  Surgeon: Lucilla Lame, MD;  Location: [place];  Service: Endoscopy;;   SHOULDER ARTHROSCOPY WITH ROTATOR CUFF REPAIR AND  SUBACROMIAL DECOMPRESSION Right 02/27/2020   Procedure: RIGHT SHOULDER ARTHROSCOPY SUBACROMIAL DECOMPRESSION, DISTAL CLAVICLE EXCISION AND MINI-OPEN ROTATOR CUFF REPAIR;  Surgeon: Thornton Park, MD;  Location: [facility];  Service: Orthopedics;  Laterality: Right;   TONSILLECTOMY     VASCULAR SURGERY  [id number]   Fem-Pop Bypass    There were no vitals filed for this visit.   Subjective Assessment - 02/24/21 1504     Subjective Patient reports she is doing well today. She reports 4/10 anterior R thigh pain upon arrival today. This is chronic intermittent pain. No excessive soreness after last therapy session. No specific questions or concerns.    Pertinent History Pt has a history of RLE vascular issues since 1991. 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 She received her RLE prosthetic in April 2022 and has been receiving [facility] since her surgery. They discharged her yesterday to continue with OP PT. She continues with phantom RLE pain, especially at night and takes gabapentin with minimal relief. She has also been having R shoulder pain since the surgery which she attributes to using a wheelchair. She underwent R RTC surgery in September of 2021 and had physical therapy afterwards. She has been having issues with bilateral carpal tunnel and has a NCV study scheduled for tomorrow.    Limitations Walking    Patient Stated Goals Ambulate with her prosthetic                TREATMENT   Neuromuscular Re-education  With prosthetic donned: 1"x4" step over with LLE practicing on RLE SLS stability without UE support 2 x 10; R single leg stability training with LLE on 6" step 3 x 30s; Static balance with LLE on 6" step without UE support x 60s; Airex feet apart static balance x 30s; Airex feet apart static balance eyes closed x 30s; Airex feet apart with horizontal and vertical head turns x 30s each; Airex R single leg stability training with LLE on 6"  step x 30s;   Gait Training Forward, backward, and side stepping in // bars without UE support and CGA from therapist x 4 lengths each direction; Forward gait in // bars without UE support CGA/minA+1 from therapist 4 lengths x 2, significantly fatiguing for patient;  Forward gait throughout gym and hallway with single point cane in LUE, pt does have one episode where she catches her RLE prosthetic forefoot during swing and requires minA+1 from therapist to prevent her from falling. Gait outside using single point cane across grass and concrete including one curb navigation (descend), no cues for proper sequencing today. Therapist providing CGA only outside without any LOB.     Pt educated throughout session about proper posture and technique with exercises. Improved exercise technique, movement at target joints, use of target muscles after min to mod verbal, visual, tactile cues.      Patient demonstrates excellent motivation during session today.  Given the strengthening was performed yesterday focused on right single-leg stability and standing as well as gait training  during session today.  Practiced forward ambulation in parallel bars without upper extremity support which is significantly fatiguing for patient.  Repeated forward gait throughout gym and hallway with single point cane in LUE, pt does have one episode where she catches her RLE prosthetic forefoot during swing and requires minA+1 from therapist to prevent her from falling. Gait outside using single point cane across grass and concrete including one curb navigation (descend), no cues for proper sequencing today. Therapist providing CGA only outside without any LOB. No HEP progression today. Patient encouraged to follow-up as scheduled. Pt will benefit from PT services to address deficits in strength, balance, and mobility in order to return to full function at home.                             PT Short Term Goals  - 01/27/21 1603       PT SHORT TERM GOAL #1   Title Pt will be independent with HEP in order to improve strength and balance in order to decrease fall risk and improve function at home.    Time 4    Period Weeks    Status New    Target Date 02/23/21               PT Long Term Goals - 01/27/21 1606       PT LONG TERM GOAL #1   Title Pt will improve BERG by at least 3 points in order to demonstrate clinically significant improvement in balance.    Baseline 01/26/21: 45/56    Time 8    Period Weeks    Status New    Target Date 03/23/21      PT LONG TERM GOAL #2   Title Pt will improve ABC by at least 13% in order to demonstrate clinically significant improvement in balance confidence.    Baseline 01/26/21: 57.5%    Time 8    Period Weeks    Status New    Target Date 03/23/21      PT LONG TERM GOAL #3   Title Pt will improve FOTO to at least 63 in order to demonstrate clinically significant improvement in function related to her RLE amputation    Baseline 01/26/21: 59    Time 8    Period Weeks    Status New    Target Date 03/23/21      PT LONG TERM GOAL #4   Title Pt will increase self-selected 10MWT by at least 0.13 m/s in order to demonstrate clinically significant improvement in community ambulation.    Baseline 01/26/21: 31.5s = 0.32 m/s    Time 8    Period Weeks    Status New    Target Date 03/23/21                   Plan - 02/24/21 1505     Clinical Impression Statement Patient demonstrates excellent motivation during session today.  Given the strengthening was performed yesterday focused on right single-leg stability and standing as well as gait training during session today.  Practiced forward ambulation in parallel bars without upper extremity support which is significantly fatiguing for patient.  Repeated forward gait throughout gym and hallway with single point cane in LUE, pt does have one episode where she catches her RLE prosthetic forefoot during  swing and requires minA+1 from therapist to prevent her from falling. Gait outside using single point cane across grass  and concrete including one curb navigation (descend), no cues for proper sequencing today. Therapist providing CGA only outside without any LOB. No HEP progression today. Patient encouraged to follow-up as scheduled. Pt will benefit from PT services to address deficits in strength, balance, and mobility in order to return to full function at home.    Personal Factors and Comorbidities Age;Comorbidity 3+    Comorbidities Depression, osteoporosis, vascular disease    Examination-Activity Limitations Bathing;Locomotion Level;Squat;Stairs;Stand;Transfers    Examination-Participation Restrictions Cleaning;Community Activity;Meal Prep;Yard Work    Stability/Clinical Decision Making Unstable/Unpredictable    Rehab Potential Good    PT Frequency 2x / week    PT Duration 12 weeks    PT Treatment/Interventions ADLs/Self Care Home Management;Aquatic Therapy;Canalith Repostioning;Cryotherapy;Electrical Stimulation;Iontophoresis 4mg /ml Dexamethasone;Moist Heat;Traction;Ultrasound;Gait training;Stair training;Therapeutic activities;Therapeutic exercise;Neuromuscular re-education;Manual techniques;Dry needling;Vestibular;Spinal Manipulations;Joint Manipulations    PT Next Visit Plan Update outcome measures and goals, progress note, practice gait training, issue HEP, strengthening, prone hip flexor stretch    PT Home Exercise Plan Access Code: [email or web address]    Consulted and Agree with Plan of Care Patient              Patient will benefit from skilled therapeutic intervention in order to improve the following deficits and impairments:  Abnormal gait, Decreased strength, Difficulty walking  Visit Diagnosis: Muscle weakness (generalized)  Difficulty in walking, not elsewhere classified     Problem List Patient Active Problem List   Diagnosis Date Noted   Muscle spasm 09/09/2020   S/P  AKA (above knee amputation), right ([place]) 09/08/2020   Ischemia of extremity 07/02/2020   Chronic pain in right shoulder 11/25/2019   Encounter for long-term (current) use of high-risk medication 07/16/2019   GCA (giant cell arteritis) ([place]) 07/16/2019   Temporal arteritis ([place]) 07/13/2019   Postoperative wound infection 06/23/2019   Ischemic leg 05/31/2019   Atherosclerosis of native arteries of extremity with intermittent claudication ([place]) 05/30/2019   Depression 05/29/2019   Eczema of lower extremity 03/04/2018   Chronic venous insufficiency 03/02/2018   Personal history of colonic polyps    Family history of colonic polyps    Benign neoplasm of ascending colon    Mitral valve insufficiency 02/08/2017   Dyspnea on exertion 02/07/2017   Non-rheumatic mitral regurgitation 02/07/2017   Precordial pain 02/07/2017   CAD (coronary artery disease) 12/21/2016   Essential hypertension 12/21/2016   Compression fracture of lumbar spine, non-traumatic ([place]) 04/26/2016   Compression fracture of L3 lumbar vertebra 04/20/2016   PVD (peripheral vascular disease) ([place]) 02/24/2016   Family history of premature CAD 02/24/2016   Gastritis    Other specified diseases of esophagus    Hiatal hernia    Gastritis and gastroduodenitis    Ischemia of lower extremity    Arterial occlusion (H[facility])    Atherosclerosis of aorta ([place])    History of smoking    Hyperlipidemia    Pain in the chest    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 02/05/2016   Carotid artery stenosis 01/04/2016   Vertigo, benign paroxysmal 12/28/2015   Clinical depression 09/06/2015   History of alcoholism ([place]) 09/06/2015   Angiopathy, 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[place]) 09/06/2015   Atherosclerosis of native arteries of extremity with rest pain ([place]) 09/06/2015   Acute non-recurrent maxillary sinusitis 07/14/2015   Vaginal pruritus 05/26/2015   Chronic recurrent major depressive disorder ([place]) 03/09/2015   Osteoporosis,  post-menopausal 03/09/2015   Peripheral blood vessel disorder ([place]) 03/09/2015   Acid reflux 03/09/2015   GERD (gastroesophageal reflux disease) 12/21/2014  Carotid artery narrowing 01/28/2014   Peripheral arterial occlusive disease ([place]) 06/08/2011   Occlusion and stenosis of unspecified carotid artery 06/08/2011   PAD (peripheral artery disease) ([place]) 06/08/2011   Phillips Grout PT, DPT, GCS  Rochelle Larue 02/24/2021, 4:10 PM  [employer] [facility] [facility] [address] [place], [place], [address] Phone: [phone]   Fax:  [phone]  Name: Sara [name] MRN: [id number] Date of Birth: [birth year]

## 2021-03-02 ENCOUNTER — Ambulatory Visit: Payer: Medicare Other

## 2021-03-02 ENCOUNTER — Other Ambulatory Visit: Payer: Self-pay

## 2021-03-02 DIAGNOSIS — R262 Difficulty in walking, not elsewhere classified: Secondary | ICD-10-CM

## 2021-03-02 DIAGNOSIS — M6281 Muscle weakness (generalized): Secondary | ICD-10-CM

## 2021-03-02 NOTE — Therapy (Signed)
[facility] [address]. [place], [place], [address] Phone: [phone]   Fax:  [phone]  Physical Therapy Progress Note/Treatment   Dates of reporting period  01/26/21   to   03/02/21  Patient Details  Name: Sara [name] MRN: [id number] Date of Birth: [birth year] Referring Provider (PT): Dr. Delana Meyer   Encounter Date: 03/02/2021   PT End of Session - 03/02/21 0959     Visit Number 10    Number of Visits 25    Date for PT Re-Evaluation 04/20/21    Authorization Type eval: 01/26/21    PT Start Time 1010    PT Stop Time 1055    PT Time Calculation (min) 45 min    Equipment Utilized During Treatment Gait belt    Activity Tolerance Patient tolerated treatment well    Behavior During Therapy [facility] for tasks assessed/performed              Past Medical History:  Diagnosis Date   Arthritis    right shoulder   Depression    Dyspnea    GERD (gastroesophageal reflux disease)    History of blood clots    Hyperlipidemia    Hypertension    Mild mitral regurgitation    Osteoporosis    Peripheral vascular disease ([place])    Stomach ulcer    Vascular disease    Sees Dr. Delana Meyer   Vertigo    Last episode approx Aug 2015   Wears dentures    full upper    Past Surgical History:  Procedure Laterality Date   ADENOIDECTOMY     AMPUTATION Right 08/11/2020   Procedure: AMPUTATION ABOVE KNEE;  Surgeon: [name], MD;  Location: [facility];  Service: Vascular;  Laterality: Right;   ARTERY BIOPSY Right 07/14/2019   Procedure: BIOPSY TEMPORAL ARTERY;  Surgeon: [name], MD;  Location: [facility];  Service: Vascular;  Laterality: Right;   BREAST CYST ASPIRATION Left    CARDIAC CATHETERIZATION  02/15/2017   [facility]   COLONOSCOPY WITH PROPOFOL N/A 10/22/2017   Procedure: COLONOSCOPY WITH PROPOFOL;  Surgeon: Lucilla Lame, MD;  Location: [place];  Service: Endoscopy;  Laterality: N/A;  specimens not taken--pt on  Plavix will be brought back in after 7 days off med   COLONOSCOPY WITH PROPOFOL N/A 11/05/2017   Procedure: COLONOSCOPY WITH PROPOFOL;  Surgeon: Lucilla Lame, MD;  Location: [place];  Service: Endoscopy;  Laterality: N/A;   ESOPHAGOGASTRODUODENOSCOPY N/A 12/21/2014   Procedure: ESOPHAGOGASTRODUODENOSCOPY (EGD);  Surgeon: Lucilla Lame, MD;  Location: [place];  Service: Gastroenterology;  Laterality: N/A;   ESOPHAGOGASTRODUODENOSCOPY (EGD) WITH PROPOFOL N/A 02/08/2016   Procedure: ESOPHAGOGASTRODUODENOSCOPY (EGD) WITH PROPOFOL;  Surgeon: Lucilla Lame, MD;  Location: [facility];  Service: Endoscopy;  Laterality: N/A;   FASCIOTOMY Right 05/30/2019   Procedure: FASCIOTOMY;  Surgeon: [name], MD;  Location: [facility];  Service: Vascular;  Laterality: Right;   FASCIOTOMY CLOSURE Right 06/04/2019   Procedure: FASCIOTOMY CLOSURE;  Surgeon: [name], MD;  Location: [facility];  Service: Vascular;  Laterality: Right;   HEMORROIDECTOMY  2014   LOWER EXTREMITY ANGIOGRAPHY Right 05/30/2019   Procedure: LOWER EXTREMITY ANGIOGRAPHY;  Surgeon: [name], MD;  Location: [place] [facility];  Service: Cardiovascular;  Laterality: Right;   LOWER EXTREMITY ANGIOGRAPHY Right 07/02/2020   Procedure: LOWER EXTREMITY ANGIOGRAPHY;  Surgeon: [name], MD;  Location: [place] [facility];  Service: Cardiovascular;  Laterality: Right;  PERIPHERAL VASCULAR CATHETERIZATION N/A 02/09/2016   Procedure: Abdominal Aortogram w/Lower Extremity;  Surgeon: [name], MD;  Location: [place] [facility];  Service: Cardiovascular;  Laterality: N/A;   PERIPHERAL VASCULAR CATHETERIZATION Right 02/10/2016   Procedure: Lower Extremity Angiography;  Surgeon: [name], MD;  Location: [place] [facility];  Service: Cardiovascular;  Laterality: Right;   POLYPECTOMY  11/05/2017   Procedure: POLYPECTOMY;  Surgeon: Lucilla Lame, MD;  Location: [place];  Service:  Endoscopy;;   SHOULDER ARTHROSCOPY WITH ROTATOR CUFF REPAIR AND SUBACROMIAL DECOMPRESSION Right 02/27/2020   Procedure: RIGHT SHOULDER ARTHROSCOPY SUBACROMIAL DECOMPRESSION, DISTAL CLAVICLE EXCISION AND MINI-OPEN ROTATOR CUFF REPAIR;  Surgeon: Thornton Park, MD;  Location: [facility];  Service: Orthopedics;  Laterality: Right;   TONSILLECTOMY     VASCULAR SURGERY  [id number]   Fem-Pop Bypass    There were no vitals filed for this visit.   Subjective Assessment - 03/02/21 0958     Subjective Patient reports she is doing well today. She denies any leg pain upon arrival today.  She reports 5/10 right shoulder pain currently which is chronic. No excessive soreness after last therapy session. No specific questions or concerns.    Pertinent History Pt has a history of RLE vascular issues since 1991. 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 She received her RLE prosthetic in April 2022 and has been receiving [place] [facility] since her surgery. They discharged her yesterday to continue with OP PT. She continues with phantom RLE pain, especially at night and takes gabapentin with minimal relief. She has also been having R shoulder pain since the surgery which she attributes to using a wheelchair. She underwent R RTC surgery in September of 2021 and had physical therapy afterwards. She has been having issues with bilateral carpal tunnel and has a NCV study scheduled for tomorrow.    Limitations Walking    Patient Stated Goals Ambulate with her prosthetic                 [facility] PT Assessment - 03/02/21 1029       Berg Balance Test   Sit to Stand Able to stand without using hands and stabilize independently    Standing Unsupported Able to stand safely 2 minutes    Sitting with Back Unsupported but Feet Supported on Floor or Stool Able to sit safely and securely 2 minutes    Stand to Sit Sits safely with minimal use of hands    Transfers Able to transfer safely, definite  need of hands    Standing Unsupported with Eyes Closed Able to stand 10 seconds safely    Standing Unsupported with Feet Together Able to place feet together independently and stand for 1 minute with supervision    From Standing, Reach Forward with Outstretched Arm Can reach forward >12 cm safely (5")    From Standing Position, Pick up Object from [place] to pick up shoe safely and easily    From Standing Position, Turn to Look Behind Over each Shoulder Looks behind from both sides and weight shifts well    Turn 360 Degrees Needs close supervision or verbal cueing    Standing Unsupported, Alternately Place Feet on Step/Stool Able to complete 4 steps without aid or supervision    Standing Unsupported, One Foot in Front Able to plae foot ahead of the other independently and hold 30 seconds    Standing on One Leg Able to lift leg independently and  hold > 10 seconds    Total Score 47               TREATMENT   Neuromuscular Re-education  Updated outcome measures and goals with patient: ABC: 89.4% FOTO: 71 BERG: 47/56 Self-selected 81mgait speed: 25.5s = 0.39 m/s  With prosthetic donned: R single leg stability training with LLE on 6" step in front and to the left  x 30s each;; Static balance on RLE with CGA/minA+1 from therapist x 30s total;   Gait Training Forward, backward, and side stepping in // bars without UE support and CGA from therapist x 4 lengths each direction;  Forward gait throughout gym with single point cane in LUE, cues from therapist for increased L step length. Gait outside using single point cane across grass and concrete including one curb navigation (descend). Cues for proper foot placement and two episodes of LOB requiring modA+1 therapist to prevent fall.      Pt educated throughout session about proper posture and technique with exercises. Improved exercise technique, movement at target joints, use of target muscles after min to mod verbal, visual,  tactile cues.      Patient demonstrates excellent motivation during session today.  Updated outcome measures and goals with patient today.  Her ABC scale score improved to 89.4% and her FOTO score improved to 71 today.  Her BERG increased by 2 points to 47/56.  Her 124mait speed increased to 0.39 m/s.  Overall patient demonstrates significant improvement in all categories since initial evaluation.  Continued with additional balance training working on right single-leg stability in parallel bars.  Also continued with gait training in parallel bars without upper extremity support and in grass outside of clinic when walking out the car. No HEP progression today. Patient encouraged to follow-up as scheduled. Pt will benefit from PT services to address deficits in strength, balance, and mobility in order to return to full function at home.                             PT Short Term Goals - 03/02/21 0959       PT SHORT TERM GOAL #1   Title Pt will be independent with HEP in order to improve strength and balance in order to decrease fall risk and improve function at home.    Time 4    Period Weeks    Status On-going    Target Date 02/23/21               PT Long Term Goals - 03/02/21 1000       PT LONG TERM GOAL #1   Title Pt will improve BERG by at least 3 points in order to demonstrate clinically significant improvement in balance.    Baseline 01/26/21: 45/56; 03/02/21: 47/56    Time 8    Period Weeks    Status Partially Met    Target Date 03/23/21      PT LONG TERM GOAL #2   Title Pt will improve ABC by at least 13% in order to demonstrate clinically significant improvement in balance confidence.    Baseline 01/26/21: 57.5%; 03/02/21: 89.4%    Time 8    Period Weeks    Status Achieved      PT LONG TERM GOAL #3   Title Pt will improve FOTO to at least 63 in order to demonstrate clinically significant improvement in function related to her RLE amputation  Baseline 01/26/21: 59; 03/02/21: 71    Time 8    Period Weeks    Status Achieved    Target Date --      PT LONG TERM GOAL #4   Title Pt will increase self-selected 10MWT by at least 0.13 m/s in order to demonstrate clinically significant improvement in community ambulation.    Baseline 01/26/21: 31.5s = 0.32 m/s; 03/02/21: 25.5s = 0.39 m/s;    Time 8    Period Weeks    Status Partially Met    Target Date 03/23/21                   Plan - 03/02/21 0959     Clinical Impression Statement Patient demonstrates excellent motivation during session today.  Updated outcome measures and goals with patient today.  Her ABC scale score improved to 89.4% and her FOTO score improved to 71 today.  Her BERG increased by 2 points to 47/56.  Her 61mgait speed increased to 0.39 m/s.  Overall patient demonstrates significant improvement in all categories since initial evaluation.  Continued with additional balance training working on right single-leg stability in parallel bars.  Also continued with gait training in parallel bars without upper extremity support and in grass outside of clinic when walking out the car. No HEP progression today. Patient encouraged to follow-up as scheduled. Pt will benefit from PT services to address deficits in strength, balance, and mobility in order to return to full function at home.    Personal Factors and Comorbidities Age;Comorbidity 3+    Comorbidities Depression, osteoporosis, vascular disease    Examination-Activity Limitations Bathing;Locomotion Level;Squat;Stairs;Stand;Transfers    Examination-Participation Restrictions Cleaning;Community Activity;Meal Prep;Yard Work    Stability/Clinical Decision Making Unstable/Unpredictable    Rehab Potential Good    PT Frequency 2x / week    PT Duration 12 weeks    PT Treatment/Interventions ADLs/Self Care Home Management;Aquatic Therapy;Canalith Repostioning;Cryotherapy;Electrical Stimulation;Iontophoresis 471mml  Dexamethasone;Moist Heat;Traction;Ultrasound;Gait training;Stair training;Therapeutic activities;Therapeutic exercise;Neuromuscular re-education;Manual techniques;Dry needling;Vestibular;Spinal Manipulations;Joint Manipulations    PT Next Visit Plan practice gait training, issue HEP, strengthening, prone hip flexor stretch    PT Home Exercise Plan Access Code: [email or web address]    Consulted and Agree with Plan of Care Patient              Patient will benefit from skilled therapeutic intervention in order to improve the following deficits and impairments:  Abnormal gait, Decreased strength, Difficulty walking  Visit Diagnosis: Muscle weakness (generalized)  Difficulty in walking, not elsewhere classified     Problem List Patient Active Problem List   Diagnosis Date Noted   Muscle spasm 09/09/2020   S/P AKA (above knee amputation), right ([facility]01/19/2022   Ischemia of extremity 07/02/2020   Chronic pain in right shoulder 11/25/2019   Encounter for long-term (current) use of high-risk medication 07/16/2019   GCA (giant cell arteritis) ([facility]11/25/2020   Temporal arteritis ([facility]11/22/2020   Postoperative wound infection 06/23/2019   Ischemic leg 05/31/2019   Atherosclerosis of native arteries of extremity with intermittent claudication ([facility] [place]10/04/2019   Depression 05/29/2019   Eczema of lower extremity 03/04/2018   Chronic venous insufficiency 03/02/2018   Personal history of colonic polyps    Family history of colonic polyps    Benign neoplasm of ascending colon    Mitral valve insufficiency 02/08/2017   Dyspnea on exertion 02/07/2017   Non-rheumatic mitral regurgitation 02/07/2017   Precordial pain 02/07/2017   CAD (coronary artery disease) 12/21/2016   Essential hypertension 12/21/2016  Compression fracture of lumbar spine, non-traumatic ([place]) 04/26/2016   Compression fracture of L3 lumbar vertebra 04/20/2016   PVD (peripheral vascular disease) ([employer]) 02/24/2016   Family  history of premature CAD 02/24/2016   Gastritis    Other specified diseases of esophagus    Hiatal hernia    Gastritis and gastroduodenitis    Ischemia of lower extremity    Arterial occlusion (HCC)    Atherosclerosis of aorta (HCC)    History of smoking    Hyperlipidemia    Pain in the chest    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 02/05/2016   Carotid artery stenosis 01/04/2016   Vertigo, benign paroxysmal 12/28/2015   Clinical depression 09/06/2015   History of alcoholism ([place]) 09/06/2015   Angiopathy, 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[place]) 09/06/2015   Atherosclerosis of native arteries of extremity with rest pain ([place]) 09/06/2015   Acute non-recurrent maxillary sinusitis 07/14/2015   Vaginal pruritus 05/26/2015   Chronic recurrent major depressive disorder ([place]) 03/09/2015   Osteoporosis, post-menopausal 03/09/2015   Peripheral blood vessel disorder ([place]) 03/09/2015   Acid reflux 03/09/2015   GERD (gastroesophageal reflux disease) 12/21/2014   Carotid artery narrowing 01/28/2014   Peripheral arterial occlusive disease ([place]) 06/08/2011   Occlusion and stenosis of unspecified carotid artery 06/08/2011   PAD (peripheral artery disease) ([place]) 06/08/2011   Lyndel Safe Domini Vandehei PT, DPT, GCS  Giang Hemme 03/02/2021, 1:12 PM  [facility] [address] [place], [place], [address] Phone: [phone]   Fax:  [phone]  Name: Sara [name] MRN: [id number] Date of Birth: 25-May-1951

## 2021-03-04 ENCOUNTER — Other Ambulatory Visit: Payer: Self-pay

## 2021-03-04 ENCOUNTER — Ambulatory Visit: Payer: Medicare Other

## 2021-03-04 DIAGNOSIS — M6281 Muscle weakness (generalized): Secondary | ICD-10-CM

## 2021-03-04 DIAGNOSIS — R262 Difficulty in walking, not elsewhere classified: Secondary | ICD-10-CM

## 2021-03-04 NOTE — Therapy (Signed)
[facility] [address] [place], [place], [address] Phone: [phone]   Fax:  [phone]  Physical Therapy Treatment  Patient Details  Name: Sara [name] MRN: [id number] Date of Birth: 08/21/1951 Referring Provider (PT): Dr. Delana Meyer   Encounter Date: 03/04/2021   PT End of Session - 03/04/21 1021     Visit Number 11    Number of Visits 25    Date for PT Re-Evaluation 04/20/21    Authorization Type eval: 01/26/21    PT Start Time 1015    PT Stop Time 1100    PT Time Calculation (min) 45 min    Equipment Utilized During Treatment Gait belt    Activity Tolerance Patient tolerated treatment well    Behavior During Therapy [facility] for tasks assessed/performed              Past Medical History:  Diagnosis Date   Arthritis    right shoulder   Depression    Dyspnea    GERD (gastroesophageal reflux disease)    History of blood clots    Hyperlipidemia    Hypertension    Mild mitral regurgitation    Osteoporosis    Peripheral vascular disease ([place])    Stomach ulcer    Vascular disease    Sees Dr. Delana Meyer   Vertigo    Last episode approx Aug 2015   Wears dentures    full upper    Past Surgical History:  Procedure Laterality Date   ADENOIDECTOMY     AMPUTATION Right 08/11/2020   Procedure: AMPUTATION ABOVE KNEE;  Surgeon: [name], MD;  Location: [facility];  Service: Vascular;  Laterality: Right;   ARTERY BIOPSY Right 07/14/2019   Procedure: BIOPSY TEMPORAL ARTERY;  Surgeon: [name], MD;  Location: [facility];  Service: Vascular;  Laterality: Right;   BREAST CYST ASPIRATION Left    CARDIAC CATHETERIZATION  02/15/2017   [facility]   COLONOSCOPY WITH PROPOFOL N/A 10/22/2017   Procedure: COLONOSCOPY WITH PROPOFOL;  Surgeon: Lucilla Lame, MD;  Location: [place];  Service: Endoscopy;  Laterality: N/A;  specimens not taken--pt on Plavix will be brought back in after 7 days off med   COLONOSCOPY WITH  PROPOFOL N/A 11/05/2017   Procedure: COLONOSCOPY WITH PROPOFOL;  Surgeon: Lucilla Lame, MD;  Location: [place];  Service: Endoscopy;  Laterality: N/A;   ESOPHAGOGASTRODUODENOSCOPY N/A 12/21/2014   Procedure: ESOPHAGOGASTRODUODENOSCOPY (EGD);  Surgeon: Lucilla Lame, MD;  Location: [place];  Service: Gastroenterology;  Laterality: N/A;   ESOPHAGOGASTRODUODENOSCOPY (EGD) WITH PROPOFOL N/A 02/08/2016   Procedure: ESOPHAGOGASTRODUODENOSCOPY (EGD) WITH PROPOFOL;  Surgeon: Lucilla Lame, MD;  Location: [facility];  Service: Endoscopy;  Laterality: N/A;   FASCIOTOMY Right 05/30/2019   Procedure: FASCIOTOMY;  Surgeon: [name], MD;  Location: [facility];  Service: Vascular;  Laterality: Right;   FASCIOTOMY CLOSURE Right 06/04/2019   Procedure: FASCIOTOMY CLOSURE;  Surgeon: [name], MD;  Location: [facility];  Service: Vascular;  Laterality: Right;   HEMORROIDECTOMY  2014   LOWER EXTREMITY ANGIOGRAPHY Right 05/30/2019   Procedure: LOWER EXTREMITY ANGIOGRAPHY;  Surgeon: [name], MD;  Location: [place] [facility];  Service: Cardiovascular;  Laterality: Right;   LOWER EXTREMITY ANGIOGRAPHY Right 07/02/2020   Procedure: LOWER EXTREMITY ANGIOGRAPHY;  Surgeon: [name], MD;  Location: [place] [facility];  Service: Cardiovascular;  Laterality: Right;   PERIPHERAL VASCULAR CATHETERIZATION N/A 02/09/2016   Procedure: Abdominal Aortogram w/Lower Extremity;  Surgeon:  [name], MD;  Location: [place] [facility];  Service: Cardiovascular;  Laterality: N/A;   PERIPHERAL VASCULAR CATHETERIZATION Right 02/10/2016   Procedure: Lower Extremity Angiography;  Surgeon: [name], MD;  Location: [place] [facility];  Service: Cardiovascular;  Laterality: Right;   POLYPECTOMY  11/05/2017   Procedure: POLYPECTOMY;  Surgeon: Lucilla Lame, MD;  Location: [place];  Service: Endoscopy;;   SHOULDER ARTHROSCOPY WITH ROTATOR CUFF REPAIR AND  SUBACROMIAL DECOMPRESSION Right 02/27/2020   Procedure: RIGHT SHOULDER ARTHROSCOPY SUBACROMIAL DECOMPRESSION, DISTAL CLAVICLE EXCISION AND MINI-OPEN ROTATOR CUFF REPAIR;  Surgeon: Thornton Park, MD;  Location: [facility];  Service: Orthopedics;  Laterality: Right;   TONSILLECTOMY     VASCULAR SURGERY  [id number]   Fem-Pop Bypass    There were no vitals filed for this visit.   Subjective Assessment - 03/04/21 0959     Subjective Patient reports she is doing well today. She denies any leg pain upon arrival today.  She took her oxycodone this morning so her shoulder is not bothering her currently. No excessive soreness after last therapy session reported. No specific questions or concerns.    Pertinent History Pt has a history of RLE vascular issues since 1991. 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 She received her RLE prosthetic in April 2022 and has been receiving [place] [facility] since her surgery. They discharged her yesterday to continue with OP PT. She continues with phantom RLE pain, especially at night and takes gabapentin with minimal relief. She has also been having R shoulder pain since the surgery which she attributes to using a wheelchair. She underwent R RTC surgery in September of 2021 and had physical therapy afterwards. She has been having issues with bilateral carpal tunnel and has a NCV study scheduled for tomorrow.    Limitations Walking    Patient Stated Goals Ambulate with her prosthetic                     TREATMENT   Ther-ex  With prosthetic doffed: Supine R SLR hip flexion with manual resistance from therapist 2 x 20; Supine RLE bridge with residual RLE resting on stool (towel on top for cushion), 3s hold, arms at side, 2 x 10 (first set with arms at side and second set with arms crossed); Supine RLE long axis manually resisted external and internal rotation 2 x 20 each; L sidelying R hip abduction with manual resistance 2 x  20; L sidelying R hip extension from 90 hip flexion to available end range extension with manual resistance 2 x 20; R sidelying hip lifts with residual RLE on 1/2 foam bolster x 10, very difficult for patient;   Neuromuscular Re-education  With prosthetic donned and : R single leg stability training with LLE on 6" step in front and to the left  2 x 30s, second set progressed to more inline position to challenge balance; R single leg stability training with LLE 6" step taps x 10; R single leg stability training with LLE forward and L lateral step over (1/2 foam roll) x 10 each    Pt educated throughout session about proper posture and technique with exercises. Improved exercise technique, movement at target joints, use of target muscles after min to mod verbal, visual, tactile cues.      Patient demonstrates excellent motivation during session today. Repeated mat table strengthening for R hip strength/stability. Also continued with balance training to work on right single-leg  stability. She has an appointment with prosthetist later today. No HEP progression today. Patient encouraged to follow-up as scheduled. Pt will benefit from PT services to address deficits in strength, balance, and mobility in order to return to full function at home.                             PT Short Term Goals - 03/02/21 0959       PT SHORT TERM GOAL #1   Title Pt will be independent with HEP in order to improve strength and balance in order to decrease fall risk and improve function at home.    Time 4    Period Weeks    Status On-going    Target Date 02/23/21               PT Long Term Goals - 03/02/21 1000       PT LONG TERM GOAL #1   Title Pt will improve BERG by at least 3 points in order to demonstrate clinically significant improvement in balance.    Baseline 01/26/21: 45/56; 03/02/21: 47/56    Time 8    Period Weeks    Status Partially Met    Target Date 03/23/21       PT LONG TERM GOAL #2   Title Pt will improve ABC by at least 13% in order to demonstrate clinically significant improvement in balance confidence.    Baseline 01/26/21: 57.5%; 03/02/21: 89.4%    Time 8    Period Weeks    Status Achieved      PT LONG TERM GOAL #3   Title Pt will improve FOTO to at least 63 in order to demonstrate clinically significant improvement in function related to her RLE amputation    Baseline 01/26/21: 59; 03/02/21: 71    Time 8    Period Weeks    Status Achieved    Target Date --      PT LONG TERM GOAL #4   Title Pt will increase self-selected 10MWT by at least 0.13 m/s in order to demonstrate clinically significant improvement in community ambulation.    Baseline 01/26/21: 31.5s = 0.32 m/s; 03/02/21: 25.5s = 0.39 m/s;    Time 8    Period Weeks    Status Partially Met    Target Date 03/23/21                   Plan - 03/04/21 1021     Clinical Impression Statement Patient demonstrates excellent motivation during session today. Repeated mat table strengthening for R hip strength/stability. Also continued with balance training to work on right single-leg stability. She has an appointment with prosthetist later today. No HEP progression today. Patient encouraged to follow-up as scheduled. Pt will benefit from PT services to address deficits in strength, balance, and mobility in order to return to full function at home.    Personal Factors and Comorbidities Age;Comorbidity 3+    Comorbidities Depression, osteoporosis, vascular disease    Examination-Activity Limitations Bathing;Locomotion Level;Squat;Stairs;Stand;Transfers    Examination-Participation Restrictions Cleaning;Community Activity;Meal Prep;Yard Work    Stability/Clinical Decision Making Unstable/Unpredictable    Rehab Potential Good    PT Frequency 2x / week    PT Duration 12 weeks    PT Treatment/Interventions ADLs/Self Care Home Management;Aquatic Therapy;Canalith  Repostioning;Cryotherapy;Electrical Stimulation;Iontophoresis 3m/ml Dexamethasone;Moist Heat;Traction;Ultrasound;Gait training;Stair training;Therapeutic activities;Therapeutic exercise;Neuromuscular re-education;Manual techniques;Dry needling;Vestibular;Spinal Manipulations;Joint Manipulations    PT Next Visit Plan practice gait training, issue  HEP, strengthening, prone hip flexor stretch    PT Home Exercise Plan Access Code: [email or web address]    Consulted and Agree with Plan of Care Patient              Patient will benefit from skilled therapeutic intervention in order to improve the following deficits and impairments:  Abnormal gait, Decreased strength, Difficulty walking  Visit Diagnosis: Muscle weakness (generalized)  Difficulty in walking, not elsewhere classified     Problem List Patient Active Problem List   Diagnosis Date Noted   Muscle spasm 09/09/2020   S/P AKA (above knee amputation), right ([place]) 09/08/2020   Ischemia of extremity 07/02/2020   Chronic pain in right shoulder 11/25/2019   Encounter for long-term (current) use of high-risk medication 07/16/2019   GCA (giant cell arteritis) ([place]) 07/16/2019   Temporal arteritis ([place]) 07/13/2019   Postoperative wound infection 06/23/2019   Ischemic leg 05/31/2019   Atherosclerosis of native arteries of extremity with intermittent claudication ([place]) 05/30/2019   Depression 05/29/2019   Eczema of lower extremity 03/04/2018   Chronic venous insufficiency 03/02/2018   Personal history of colonic polyps    Family history of colonic polyps    Benign neoplasm of ascending colon    Mitral valve insufficiency 02/08/2017   Dyspnea on exertion 02/07/2017   Non-rheumatic mitral regurgitation 02/07/2017   Precordial pain 02/07/2017   CAD (coronary artery disease) 12/21/2016   Essential hypertension 12/21/2016   Compression fracture of lumbar spine, non-traumatic ([place]) 04/26/2016   Compression fracture of L3 lumbar vertebra  04/20/2016   PVD (peripheral vascular disease) ([place]) 02/24/2016   Family history of premature CAD 02/24/2016   Gastritis    Other specified diseases of esophagus    Hiatal hernia    Gastritis and gastroduodenitis    Ischemia of lower extremity    Arterial occlusion ([facility])    Atherosclerosis of aorta ([place])    History of smoking    Hyperlipidemia    Pain in the chest    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 02/05/2016   Carotid artery stenosis 01/04/2016   Vertigo, benign paroxysmal 12/28/2015   Clinical depression 09/06/2015   History of alcoholism ([place]) 09/06/2015   Angiopathy, 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[place]) 09/06/2015   Atherosclerosis of native arteries of extremity with rest pain ([place]) 09/06/2015   Acute non-recurrent maxillary sinusitis 07/14/2015   Vaginal pruritus 05/26/2015   Chronic recurrent major depressive disorder ([place]) 03/09/2015   Osteoporosis, post-menopausal 03/09/2015   Peripheral blood vessel disorder ([place]) 03/09/2015   Acid reflux 03/09/2015   GERD (gastroesophageal reflux disease) 12/21/2014   Carotid artery narrowing 01/28/2014   Peripheral arterial occlusive disease ([place]) 06/08/2011   Occlusion and stenosis of unspecified carotid artery 06/08/2011   PAD (peripheral artery disease) ([place]) 06/08/2011   Lyndel Safe Evaristo Tsuda PT, DPT, GCS  Katryna Tschirhart 03/04/2021, 11:24 AM  [facility] [facility] [address] [place], [place], [address] Phone: [phone]   Fax:  [phone]  Name: KATHERN LOBOSCO MRN: [id number] Date of Birth: 05/15/1951

## 2021-03-09 ENCOUNTER — Ambulatory Visit: Payer: Medicare Other

## 2021-03-09 ENCOUNTER — Other Ambulatory Visit: Payer: Self-pay

## 2021-03-09 DIAGNOSIS — R262 Difficulty in walking, not elsewhere classified: Secondary | ICD-10-CM

## 2021-03-09 DIAGNOSIS — M6281 Muscle weakness (generalized): Secondary | ICD-10-CM

## 2021-03-09 NOTE — Therapy (Signed)
[facility] [address] [place], [place], [address] Phone: [phone]   Fax:  [phone]  Physical Therapy Treatment  Patient Details  Name: Sara [name] MRN: [id number] Date of Birth: Dec 11, 1950 Referring Provider (PT): Dr. Delana Meyer   Encounter Date: 03/09/2021   PT End of Session - 03/09/21 1022     Visit Number 12    Number of Visits 25    Date for PT Re-Evaluation 04/20/21    Authorization Type eval: 01/26/21    PT Start Time 1015    PT Stop Time 1100    PT Time Calculation (min) 45 min    Equipment Utilized During Treatment Gait belt    Activity Tolerance Patient tolerated treatment well    Behavior During Therapy [facility] for tasks assessed/performed              Past Medical History:  Diagnosis Date   Arthritis    right shoulder   Depression    Dyspnea    GERD (gastroesophageal reflux disease)    History of blood clots    Hyperlipidemia    Hypertension    Mild mitral regurgitation    Osteoporosis    Peripheral vascular disease ([place])    Stomach ulcer    Vascular disease    Sees Dr. Delana Meyer   Vertigo    Last episode approx Aug 2015   Wears dentures    full upper    Past Surgical History:  Procedure Laterality Date   ADENOIDECTOMY     AMPUTATION Right 08/11/2020   Procedure: AMPUTATION ABOVE KNEE;  Surgeon: [name], MD;  Location: [facility];  Service: Vascular;  Laterality: Right;   ARTERY BIOPSY Right 07/14/2019   Procedure: BIOPSY TEMPORAL ARTERY;  Surgeon: [name], MD;  Location: [facility];  Service: Vascular;  Laterality: Right;   BREAST CYST ASPIRATION Left    CARDIAC CATHETERIZATION  02/15/2017   [facility]   COLONOSCOPY WITH PROPOFOL N/A 10/22/2017   Procedure: COLONOSCOPY WITH PROPOFOL;  Surgeon: Lucilla Lame, MD;  Location: [place];  Service: Endoscopy;  Laterality: N/A;  specimens not taken--pt on Plavix will be brought back in after 7 days off med   COLONOSCOPY WITH  PROPOFOL N/A 11/05/2017   Procedure: COLONOSCOPY WITH PROPOFOL;  Surgeon: Lucilla Lame, MD;  Location: [place];  Service: Endoscopy;  Laterality: N/A;   ESOPHAGOGASTRODUODENOSCOPY N/A 12/21/2014   Procedure: ESOPHAGOGASTRODUODENOSCOPY (EGD);  Surgeon: Lucilla Lame, MD;  Location: [place];  Service: Gastroenterology;  Laterality: N/A;   ESOPHAGOGASTRODUODENOSCOPY (EGD) WITH PROPOFOL N/A 02/08/2016   Procedure: ESOPHAGOGASTRODUODENOSCOPY (EGD) WITH PROPOFOL;  Surgeon: Lucilla Lame, MD;  Location: [facility];  Service: Endoscopy;  Laterality: N/A;   FASCIOTOMY Right 05/30/2019   Procedure: FASCIOTOMY;  Surgeon: [name], MD;  Location: [facility];  Service: Vascular;  Laterality: Right;   FASCIOTOMY CLOSURE Right 06/04/2019   Procedure: FASCIOTOMY CLOSURE;  Surgeon: [name], MD;  Location: [facility];  Service: Vascular;  Laterality: Right;   HEMORROIDECTOMY  2014   LOWER EXTREMITY ANGIOGRAPHY Right 05/30/2019   Procedure: LOWER EXTREMITY ANGIOGRAPHY;  Surgeon: [name], MD;  Location: [place] [facility];  Service: Cardiovascular;  Laterality: Right;   LOWER EXTREMITY ANGIOGRAPHY Right 07/02/2020   Procedure: LOWER EXTREMITY ANGIOGRAPHY;  Surgeon: [name], MD;  Location: [place] [facility];  Service: Cardiovascular;  Laterality: Right;   PERIPHERAL VASCULAR CATHETERIZATION N/A 02/09/2016   Procedure: Abdominal Aortogram w/Lower Extremity;  Surgeon:  [name], MD;  Location: [place] [facility];  Service: Cardiovascular;  Laterality: N/A;   PERIPHERAL VASCULAR CATHETERIZATION Right 02/10/2016   Procedure: Lower Extremity Angiography;  Surgeon: [name], MD;  Location: [place] [facility];  Service: Cardiovascular;  Laterality: Right;   POLYPECTOMY  11/05/2017   Procedure: POLYPECTOMY;  Surgeon: Lucilla Lame, MD;  Location: [place];  Service: Endoscopy;;   SHOULDER ARTHROSCOPY WITH ROTATOR CUFF REPAIR AND  SUBACROMIAL DECOMPRESSION Right 02/27/2020   Procedure: RIGHT SHOULDER ARTHROSCOPY SUBACROMIAL DECOMPRESSION, DISTAL CLAVICLE EXCISION AND MINI-OPEN ROTATOR CUFF REPAIR;  Surgeon: Thornton Park, MD;  Location: [facility];  Service: Orthopedics;  Laterality: Right;   TONSILLECTOMY     VASCULAR SURGERY  [id number]   Fem-Pop Bypass    There were no vitals filed for this visit.   Subjective Assessment - 03/09/21 1016     Subjective Patient reports she is doing well today. She denies any leg pain upon arrival today. She saw her prosthetist last week who adjusted her R prosthetic to decrease the amount of toe out during ambulation. She took her oxycodone this morning so her right shoulder is not bothering her currently. No excessive soreness after last therapy session. She tripped on a rock yesterday and almost fell but was able to recover without falling to the ground. No specific questions or concerns.    Pertinent History Pt has a history of RLE vascular issues since 1991. 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 She received her RLE prosthetic in April 2022 and has been receiving [place] PT since her surgery. They discharged her yesterday to continue with OP PT. She continues with phantom RLE pain, especially at night and takes gabapentin with minimal relief. She has also been having R shoulder pain since the surgery which she attributes to using a wheelchair. She underwent R RTC surgery in September of 2021 and had physical therapy afterwards. She has been having issues with bilateral carpal tunnel and has a NCV study scheduled for tomorrow.    Limitations Walking    Patient Stated Goals Ambulate with her prosthetic                     TREATMENT   Ther-ex  With prosthetic donned: NuStep L1-2 x 5 minutes LE only during history;  With prosthetic doffed: Supine R SLR hip flexion with manual resistance from therapist 2 x 20; Supine RLE bridge with  residual RLE resting on stool (towel on top for cushion), arms across chest, 3s hold, 2 x 10; Supine RLE long axis manually resisted external and internal rotation 2 x 20 each; L sidelying R hip abduction with manual resistance 2 x 20; L sidelying R hip extension from 90 hip flexion to available end range extension with manual resistance 2 x 20;   Neuromuscular Re-education  With prosthetic doffed: Seated on BOSU (round side up) on mat table without LLE on ground static balance x 30s; Seated on BOSU (round side up) on mat table without LLE on ground 4# med ball diagonal lifts x 10 toward each side; Seated on BOSU (round side up) on mat table without LLE on ground balance with therapist providing dynamic resistance to challenge stability x 30s;  R single leg stability training with L forward and L lateral step over (1/2 foam roll) x 10 each; Forward gait in // bars without UE support x 4 lengths; Lateral stepping in // bars without UE support x  2 lengths;    Pt educated throughout session about proper posture and technique with exercises. Improved exercise technique, movement at target joints, use of target muscles after min to mod verbal, visual, tactile cues.      Patient demonstrates excellent motivation during session today. Repeated mat table strengthening for R hip strength/stability. Also continued with balance training to work on right single-leg stability.  Practiced gait in parallel bars without upper extremity support as well as lateral stepping.  Patient encouraged to follow-up as scheduled. Pt will benefit from PT services to address deficits in strength, balance, and mobility in order to return to full function at home.                             PT Short Term Goals - 03/02/21 0959       PT SHORT TERM GOAL #1   Title Pt will be independent with HEP in order to improve strength and balance in order to decrease fall risk and improve function at home.     Time 4    Period Weeks    Status On-going    Target Date 02/23/21               PT Long Term Goals - 03/02/21 1000       PT LONG TERM GOAL #1   Title Pt will improve BERG by at least 3 points in order to demonstrate clinically significant improvement in balance.    Baseline 01/26/21: 45/56; 03/02/21: 47/56    Time 8    Period Weeks    Status Partially Met    Target Date 03/23/21      PT LONG TERM GOAL #2   Title Pt will improve ABC by at least 13% in order to demonstrate clinically significant improvement in balance confidence.    Baseline 01/26/21: 57.5%; 03/02/21: 89.4%    Time 8    Period Weeks    Status Achieved      PT LONG TERM GOAL #3   Title Pt will improve FOTO to at least 63 in order to demonstrate clinically significant improvement in function related to her RLE amputation    Baseline 01/26/21: 59; 03/02/21: 71    Time 8    Period Weeks    Status Achieved    Target Date --      PT LONG TERM GOAL #4   Title Pt will increase self-selected 10MWT by at least 0.13 m/s in order to demonstrate clinically significant improvement in community ambulation.    Baseline 01/26/21: 31.5s = 0.32 m/s; 03/02/21: 25.5s = 0.39 m/s;    Time 8    Period Weeks    Status Partially Met    Target Date 03/23/21                   Plan - 03/09/21 1022     Clinical Impression Statement Patient demonstrates excellent motivation during session today. Repeated mat table strengthening for R hip strength/stability. Also continued with balance training to work on right single-leg stability.  Practiced gait in parallel bars without upper extremity support as well as lateral stepping.  Patient encouraged to follow-up as scheduled. Pt will benefit from PT services to address deficits in strength, balance, and mobility in order to return to full function at home.    Personal Factors and Comorbidities Age;Comorbidity 3+    Comorbidities Depression, osteoporosis, vascular disease     Examination-Activity Limitations Bathing;Locomotion  Level;Squat;Stairs;Stand;Transfers    Examination-Participation Restrictions Cleaning;Community Activity;Meal Prep;Yard Work    Stability/Clinical Decision Making Unstable/Unpredictable    Rehab Potential Good    PT Frequency 2x / week    PT Duration 12 weeks    PT Treatment/Interventions ADLs/Self Care Home Management;Aquatic Therapy;Canalith Repostioning;Cryotherapy;Electrical Stimulation;Iontophoresis 34m/ml Dexamethasone;Moist Heat;Traction;Ultrasound;Gait training;Stair training;Therapeutic activities;Therapeutic exercise;Neuromuscular re-education;Manual techniques;Dry needling;Vestibular;Spinal Manipulations;Joint Manipulations    PT Next Visit Plan practice gait training, issue HEP, strengthening, prone hip flexor stretch    PT Home Exercise Plan Access Code: [email or web address]    Consulted and Agree with Plan of Care Patient              Patient will benefit from skilled therapeutic intervention in order to improve the following deficits and impairments:  Abnormal gait, Decreased strength, Difficulty walking  Visit Diagnosis: Muscle weakness (generalized)  Difficulty in walking, not elsewhere classified     Problem List Patient Active Problem List   Diagnosis Date Noted   Muscle spasm 09/09/2020   S/P AKA (above knee amputation), right ([facility] 09/08/2020   Ischemia of extremity 07/02/2020   Chronic pain in right shoulder 11/25/2019   Encounter for long-term (current) use of high-risk medication 07/16/2019   GCA (giant cell arteritis) ([facility] 07/16/2019   Temporal arteritis ([facility] 07/13/2019   Postoperative wound infection 06/23/2019   Ischemic leg 05/31/2019   Atherosclerosis of native arteries of extremity with intermittent claudication ([facility] 05/30/2019   Depression 05/29/2019   Eczema of lower extremity 03/04/2018   Chronic venous insufficiency 03/02/2018   Personal history of colonic polyps    Family history of colonic  polyps    Benign neoplasm of ascending colon    Mitral valve insufficiency 02/08/2017   Dyspnea on exertion 02/07/2017   Non-rheumatic mitral regurgitation 02/07/2017   Precordial pain 02/07/2017   CAD (coronary artery disease) 12/21/2016   Essential hypertension 12/21/2016   Compression fracture of lumbar spine, non-traumatic ([facility] 04/26/2016   Compression fracture of L3 lumbar vertebra 04/20/2016   PVD (peripheral vascular disease) ([facility] 02/24/2016   Family history of premature CAD 02/24/2016   Gastritis    Other specified diseases of esophagus    Hiatal hernia    Gastritis and gastroduodenitis    Ischemia of lower extremity    Arterial occlusion (HCC)    Atherosclerosis of aorta (HCC)    History of smoking    Hyperlipidemia    Pain in the chest    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 02/05/2016   Carotid artery stenosis 01/04/2016   Vertigo, benign paroxysmal 12/28/2015   Clinical depression 09/06/2015   History of alcoholism ([facility] 09/06/2015   Angiopathy, 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[facility] 09/06/2015   Atherosclerosis of native arteries of extremity with rest pain ([facility] 09/06/2015   Acute non-recurrent maxillary sinusitis 07/14/2015   Vaginal pruritus 05/26/2015   Chronic recurrent major depressive disorder ([facility] 03/09/2015   Osteoporosis, post-menopausal 03/09/2015   Peripheral blood vessel disorder ([facility] 03/09/2015   Acid reflux 03/09/2015   GERD (gastroesophageal reflux disease) 12/21/2014   Carotid artery narrowing 01/28/2014   Peripheral arterial occlusive disease ([facility] 06/08/2011   Occlusion and stenosis of unspecified carotid artery 06/08/2011   PAD (peripheral artery disease) ([facility] 06/08/2011   JLyndel SafeHuprich PT, DPT, GCS  Unice Vantassel 03/09/2021, 1:27 PM  [place] [facility] [place] [address]Phone: [phone]  Fax:  [phone] Name: HCAMREIGH MICHIEMRN: [id number] of  Birth: 209-21-1952

## 2021-03-11 ENCOUNTER — Ambulatory Visit: Payer: Medicare Other

## 2021-03-11 ENCOUNTER — Other Ambulatory Visit: Payer: Self-pay

## 2021-03-11 DIAGNOSIS — R262 Difficulty in walking, not elsewhere classified: Secondary | ICD-10-CM

## 2021-03-11 DIAGNOSIS — R2681 Unsteadiness on feet: Secondary | ICD-10-CM

## 2021-03-11 DIAGNOSIS — M6281 Muscle weakness (generalized): Secondary | ICD-10-CM

## 2021-03-11 NOTE — Therapy (Signed)
[facility] [address] [place], [place], [address] Phone: [phone]   Fax:  [phone]  Physical Therapy Treatment  Patient Details  Name: Sara [name] MRN: [id number] Date of Birth: Aug 30, 1950 Referring Provider (PT): Dr. Delana Meyer   Encounter Date: 03/11/2021   PT End of Session - 03/11/21 1133     Visit Number 13    Number of Visits 25    Date for PT Re-Evaluation 04/20/21    Authorization Type eval: 01/26/21    PT Start Time 1014    PT Stop Time 1059    PT Time Calculation (min) 45 min    Equipment Utilized During Treatment Gait belt    Activity Tolerance Patient tolerated treatment well    Behavior During Therapy [name] for tasks assessed/performed             Past Medical History:  Diagnosis Date   Arthritis    right shoulder   Depression    Dyspnea    GERD (gastroesophageal reflux disease)    History of blood clots    Hyperlipidemia    Hypertension    Mild mitral regurgitation    Osteoporosis    Peripheral vascular disease ([place])    Stomach ulcer    Vascular disease    Sees Dr. Delana Meyer   Vertigo    Last episode approx Aug 2015   Wears dentures    full upper    Past Surgical History:  Procedure Laterality Date   ADENOIDECTOMY     AMPUTATION Right 08/11/2020   Procedure: AMPUTATION ABOVE KNEE;  Surgeon: [name], MD;  Location: [facility];  Service: Vascular;  Laterality: Right;   ARTERY BIOPSY Right 07/14/2019   Procedure: BIOPSY TEMPORAL ARTERY;  Surgeon: [name], MD;  Location: [facility];  Service: Vascular;  Laterality: Right;   BREAST CYST ASPIRATION Left    CARDIAC CATHETERIZATION  02/15/2017   [facility]   COLONOSCOPY WITH PROPOFOL N/A 10/22/2017   Procedure: COLONOSCOPY WITH PROPOFOL;  Surgeon: Lucilla Lame, MD;  Location: [place];  Service: Endoscopy;  Laterality: N/A;  specimens not taken--pt on Plavix will be brought back in after 7 days off med   COLONOSCOPY WITH  PROPOFOL N/A 11/05/2017   Procedure: COLONOSCOPY WITH PROPOFOL;  Surgeon: Lucilla Lame, MD;  Location: [place];  Service: Endoscopy;  Laterality: N/A;   ESOPHAGOGASTRODUODENOSCOPY N/A 12/21/2014   Procedure: ESOPHAGOGASTRODUODENOSCOPY (EGD);  Surgeon: Lucilla Lame, MD;  Location: [place];  Service: Gastroenterology;  Laterality: N/A;   ESOPHAGOGASTRODUODENOSCOPY (EGD) WITH PROPOFOL N/A 02/08/2016   Procedure: ESOPHAGOGASTRODUODENOSCOPY (EGD) WITH PROPOFOL;  Surgeon: Lucilla Lame, MD;  Location: [facility];  Service: Endoscopy;  Laterality: N/A;   FASCIOTOMY Right 05/30/2019   Procedure: FASCIOTOMY;  Surgeon: [name], MD;  Location: [facility];  Service: Vascular;  Laterality: Right;   FASCIOTOMY CLOSURE Right 06/04/2019   Procedure: FASCIOTOMY CLOSURE;  Surgeon: [name], MD;  Location: [facility];  Service: Vascular;  Laterality: Right;   HEMORROIDECTOMY  2014   LOWER EXTREMITY ANGIOGRAPHY Right 05/30/2019   Procedure: LOWER EXTREMITY ANGIOGRAPHY;  Surgeon: [name], MD;  Location: [place] [facility];  Service: Cardiovascular;  Laterality: Right;   LOWER EXTREMITY ANGIOGRAPHY Right 07/02/2020   Procedure: LOWER EXTREMITY ANGIOGRAPHY;  Surgeon: [name], MD;  Location: [place] [facility];  Service: Cardiovascular;  Laterality: Right;   PERIPHERAL VASCULAR CATHETERIZATION N/A 02/09/2016   Procedure: Abdominal Aortogram w/Lower Extremity;  Surgeon: Belenda Cruise  Eloise Levels, MD;  Location: [place] [facility];  Service: Cardiovascular;  Laterality: N/A;   PERIPHERAL VASCULAR CATHETERIZATION Right 02/10/2016   Procedure: Lower Extremity Angiography;  Surgeon: [name], MD;  Location: [place] [facility];  Service: Cardiovascular;  Laterality: Right;   POLYPECTOMY  11/05/2017   Procedure: POLYPECTOMY;  Surgeon: Lucilla Lame, MD;  Location: [place];  Service: Endoscopy;;   SHOULDER ARTHROSCOPY WITH ROTATOR CUFF REPAIR AND  SUBACROMIAL DECOMPRESSION Right 02/27/2020   Procedure: RIGHT SHOULDER ARTHROSCOPY SUBACROMIAL DECOMPRESSION, DISTAL CLAVICLE EXCISION AND MINI-OPEN ROTATOR CUFF REPAIR;  Surgeon: Thornton Park, MD;  Location: [facility];  Service: Orthopedics;  Laterality: Right;   TONSILLECTOMY     VASCULAR SURGERY  7989,2119   Fem-Pop Bypass    There were no vitals filed for this visit.   Subjective Assessment - 03/11/21 1119     Subjective Pt presents to PT treatment today with no pain in the RLE. Pt reported mild pain in shoulder at the start of tx, however, she has taken medication for increase activity tolerance during tx. Pt stated increased time with prosthetic leg donned during house-related ADLs and noted a increase in redness at that distal aspect of the proximal residual limb.    Pertinent History Pt has a history of RLE vascular issues since 1991. 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 She received her RLE prosthetic in April 2022 and has been receiving [facility] since her surgery. They discharged her yesterday to continue with OP PT. She continues with phantom RLE pain, especially at night and takes gabapentin with minimal relief. She has also been having R shoulder pain since the surgery which she attributes to using a wheelchair. She underwent R RTC surgery in September of 2021 and had physical therapy afterwards. She has been having issues with bilateral carpal tunnel and has a NCV study scheduled for tomorrow.    Limitations Walking    Patient Stated Goals Ambulate with her prosthetic    Currently in Pain? No/denies            Observation: R residual limp presents with mild redness without blanching  and signs of capillary disturbance. 1.5x2.5 inch. No discharge, heat. Area of circle with pend and pt ed was given on continued observation and to contact MD if it got worse.    TREATMENT     Ther-ex  With prosthetic doffed: Supine R SLR hip flexion  with manual resistance from therapist 3 x 20; Supine RLE bridge with residual RLE resting on stool (towel on top for cushion), arms across chest, 3s hold, 3 x 10; Supine RLE long axis manually resisted external and internal rotation 3 x 20 each; L sidelying R hip abduction with manual resistance 3 x 20; L sidelying R hip extension from 90 hip flexion to available end range extension with manual resistance 23x 20;     Neuromuscular Re-education  With prosthetic doffed:  Seated on BOSU (round side up) on mat table without LLE on ground 6# med ball diagonal lifts,sagittal plane lifts, and trunk rotation with shoulder at 90 deg and elbows ext. x 10 toward each side;    Pt educated throughout session about proper posture and technique with exercises. Improved exercise technique, movement at target joints, use of target muscles after min to mod verbal, visual, tactile cues.      Tx was conducted on mat table with prosthetic limb doffed 2/2 increase redness of the distal limb. R  residual limp presents with mild redness without signs of infection (no warmth, swelling, breaks in the skin, or discharge). Red area is approximately 1.5x2.5 inch and outline was traced with a pen. Pt education was provided for continued observation and to contact MD if pt has any concerns or redness worsens. Mat therex and bosu ball Neuromuscular re-ed was increased in intensity with additional set and increase med ball weight. Pt. will continue to benefit from skilled physical therapy to progress POC to address remaining deficits to facilitate maximum functional capacity for optimal personal health and wellness for ADLs.            PT Education - 03/11/21 1132     Education Details Pt was educated to monitor redness of the distal residual limb    Person(s) Educated Patient    Methods Explanation    Comprehension Verbalized understanding              PT Short Term Goals - 03/02/21 0959       PT SHORT  TERM GOAL #1   Title Pt will be independent with HEP in order to improve strength and balance in order to decrease fall risk and improve function at home.    Time 4    Period Weeks    Status On-going    Target Date 02/23/21               PT Long Term Goals - 03/02/21 1000       PT LONG TERM GOAL #1   Title Pt will improve BERG by at least 3 points in order to demonstrate clinically significant improvement in balance.    Baseline 01/26/21: 45/56; 03/02/21: 47/56    Time 8    Period Weeks    Status Partially Met    Target Date 03/23/21      PT LONG TERM GOAL #2   Title Pt will improve ABC by at least 13% in order to demonstrate clinically significant improvement in balance confidence.    Baseline 01/26/21: 57.5%; 03/02/21: 89.4%    Time 8    Period Weeks    Status Achieved      PT LONG TERM GOAL #3   Title Pt will improve FOTO to at least 63 in order to demonstrate clinically significant improvement in function related to her RLE amputation    Baseline 01/26/21: 59; 03/02/21: 71    Time 8    Period Weeks    Status Achieved    Target Date --      PT LONG TERM GOAL #4   Title Pt will increase self-selected 10MWT by at least 0.13 m/s in order to demonstrate clinically significant improvement in community ambulation.    Baseline 01/26/21: 31.5s = 0.32 m/s; 03/02/21: 25.5s = 0.39 m/s;    Time 8    Period Weeks    Status Partially Met    Target Date 03/23/21                   Plan - 03/11/21 1135     Clinical Impression Statement Tx was conducted on mat table with prosthetic limb doffed 2/2 increase redness of the distal limb. R residual limp presents with mild redness without signs of infection (no warmth, swelling, breaks in the skin, or discharge). Red area is approximately 1.5x2.5 inch and outline was traced with a pen. Pt education was provided for continued observation and to contact MD if pt has any concerns or redness worsens. Mat therex and  bosu ball Neuromuscular  re-ed was increased in intensity with additional set and increase med ball weight. Pt. will continue to benefit from skilled physical therapy to progress POC to address remaining deficits to facilitate maximum functional capacity for optimal personal health and wellness for ADLs.    Personal Factors and Comorbidities Age;Comorbidity 3+    Comorbidities Depression, osteoporosis, vascular disease    Examination-Activity Limitations Bathing;Locomotion Level;Squat;Stairs;Stand;Transfers    Examination-Participation Restrictions Cleaning;Community Activity;Meal Prep;Yard Work    Stability/Clinical Decision Making Unstable/Unpredictable    Surveyor, mining    Rehab Potential Good    PT Frequency 2x / week    PT Duration 12 weeks    PT Treatment/Interventions ADLs/Self Care Home Management;Aquatic Therapy;Canalith Repostioning;Cryotherapy;Electrical Stimulation;Iontophoresis 64m/ml Dexamethasone;Moist Heat;Traction;Ultrasound;Gait training;Stair training;Therapeutic activities;Therapeutic exercise;Neuromuscular re-education;Manual techniques;Dry needling;Vestibular;Spinal Manipulations;Joint Manipulations    PT Next Visit Plan practice gait training, issue HEP, strengthening, prone hip flexor stretch    PT Home Exercise Plan Access Code: [email or web address]    Consulted and Agree with Plan of Care Patient             Patient will benefit from skilled therapeutic intervention in order to improve the following deficits and impairments:  Abnormal gait, Decreased strength, Difficulty walking  Visit Diagnosis: Muscle weakness (generalized)  Unsteadiness on feet  Difficulty in walking, not elsewhere classified     Problem List Patient Active Problem List   Diagnosis Date Noted   Muscle spasm 09/09/2020   S/P AKA (above knee amputation), right ([place] 09/08/2020   Ischemia of extremity 07/02/2020   Chronic pain in right shoulder 11/25/2019   Encounter for long-term (current) use of  high-risk medication 07/16/2019   GCA (giant cell arteritis) ([facility] 07/16/2019   Temporal arteritis ([place] 07/13/2019   Postoperative wound infection 06/23/2019   Ischemic leg 05/31/2019   Atherosclerosis of native arteries of extremity with intermittent claudication ([facility] 05/30/2019   Depression 05/29/2019   Eczema of lower extremity 03/04/2018   Chronic venous insufficiency 03/02/2018   Personal history of colonic polyps    Family history of colonic polyps    Benign neoplasm of ascending colon    Mitral valve insufficiency 02/08/2017   Dyspnea on exertion 02/07/2017   Non-rheumatic mitral regurgitation 02/07/2017   Precordial pain 02/07/2017   CAD (coronary artery disease) 12/21/2016   Essential hypertension 12/21/2016   Compression fracture of lumbar spine, non-traumatic ([place] 04/26/2016   Compression fracture of L3 lumbar vertebra 04/20/2016   PVD (peripheral vascular disease) ([facility] [place] 02/24/2016   Family history of premature CAD 02/24/2016   Gastritis    Other specified diseases of esophagus    Hiatal hernia    Gastritis and gastroduodenitis    Ischemia of lower extremity    Arterial occlusion (HCC)    Atherosclerosis of aorta ([facility]    History of smoking    Hyperlipidemia    Pain in the chest    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 02/05/2016   Carotid artery stenosis 01/04/2016   Vertigo, benign paroxysmal 12/28/2015   Clinical depression 09/06/2015   History of alcoholism ([facility] 09/06/2015   Angiopathy, 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[facility] 09/06/2015   Atherosclerosis of native arteries of extremity with rest pain ([place] 09/06/2015   Acute non-recurrent maxillary sinusitis 07/14/2015   Vaginal pruritus 05/26/2015   Chronic recurrent major depressive disorder ([facility] 03/09/2015   Osteoporosis, post-menopausal 03/09/2015   Peripheral blood vessel disorder ([facility] 03/09/2015   Acid reflux 03/09/2015   GERD (gastroesophageal reflux disease) 12/21/2014   Carotid artery  narrowing  01/28/2014   Peripheral arterial occlusive disease ([place]) 06/08/2011   Occlusion and stenosis of unspecified carotid artery 06/08/2011   PAD (peripheral artery disease) ([place]) 06/08/2011    This entire session was performed under direct supervision and direction of a licensed therapist/therapist assistant . I have personally read, edited and approve of the note as written.   Fara Olden SPT Phillips Grout PT, DPT, GCS  Huprich,Jason 03/11/2021, 12:32 PM  [facility] [address] [place], [place], [address] Phone: [phone]   Fax:  [phone]  Name: Sara [name] MRN: [id number] Date of Birth: 1951-02-03

## 2021-03-16 ENCOUNTER — Ambulatory Visit: Payer: Medicare Other | Admitting: Physical Therapy

## 2021-03-16 ENCOUNTER — Other Ambulatory Visit: Payer: Self-pay

## 2021-03-16 DIAGNOSIS — R2681 Unsteadiness on feet: Secondary | ICD-10-CM

## 2021-03-16 DIAGNOSIS — M6281 Muscle weakness (generalized): Secondary | ICD-10-CM | POA: Diagnosis not present

## 2021-03-16 DIAGNOSIS — R262 Difficulty in walking, not elsewhere classified: Secondary | ICD-10-CM

## 2021-03-16 NOTE — Therapy (Signed)
[facility] [address] [place], [place], [address] Phone: [phone]   Fax:  [phone]  Physical Therapy Treatment  Patient Details  Name: Sara [name] MRN: [id number] Date of Birth: June 20, 1951 Referring Provider (PT): Dr. Delana Meyer   Encounter Date: 03/16/2021   PT End of Session - 03/16/21 1508     Visit Number 14    Number of Visits 25    Date for PT Re-Evaluation 04/20/21    Authorization Type eval: 01/26/21    PT Start Time 1315    PT Stop Time 1400    PT Time Calculation (min) 45 min    Equipment Utilized During Treatment Gait belt    Activity Tolerance Patient tolerated treatment well    Behavior During Therapy [facility] for tasks assessed/performed             Past Medical History:  Diagnosis Date   Arthritis    right shoulder   Depression    Dyspnea    GERD (gastroesophageal reflux disease)    History of blood clots    Hyperlipidemia    Hypertension    Mild mitral regurgitation    Osteoporosis    Peripheral vascular disease ([place])    Stomach ulcer    Vascular disease    Sees Dr. Delana Meyer   Vertigo    Last episode approx Aug 2015   Wears dentures    full upper    Past Surgical History:  Procedure Laterality Date   ADENOIDECTOMY     AMPUTATION Right 08/11/2020   Procedure: AMPUTATION ABOVE KNEE;  Surgeon: [name], MD;  Location: [facility];  Service: Vascular;  Laterality: Right;   ARTERY BIOPSY Right 07/14/2019   Procedure: BIOPSY TEMPORAL ARTERY;  Surgeon: [name], MD;  Location: [facility];  Service: Vascular;  Laterality: Right;   BREAST CYST ASPIRATION Left    CARDIAC CATHETERIZATION  02/15/2017   [facility]   COLONOSCOPY WITH PROPOFOL N/A 10/22/2017   Procedure: COLONOSCOPY WITH PROPOFOL;  Surgeon: Lucilla Lame, MD;  Location: [place];  Service: Endoscopy;  Laterality: N/A;  specimens not taken--pt on Plavix will be brought back in after 7 days off med   COLONOSCOPY WITH  PROPOFOL N/A 11/05/2017   Procedure: COLONOSCOPY WITH PROPOFOL;  Surgeon: Lucilla Lame, MD;  Location: [place];  Service: Endoscopy;  Laterality: N/A;   ESOPHAGOGASTRODUODENOSCOPY N/A 12/21/2014   Procedure: ESOPHAGOGASTRODUODENOSCOPY (EGD);  Surgeon: Lucilla Lame, MD;  Location: [place];  Service: Gastroenterology;  Laterality: N/A;   ESOPHAGOGASTRODUODENOSCOPY (EGD) WITH PROPOFOL N/A 02/08/2016   Procedure: ESOPHAGOGASTRODUODENOSCOPY (EGD) WITH PROPOFOL;  Surgeon: Lucilla Lame, MD;  Location: [facility];  Service: Endoscopy;  Laterality: N/A;   FASCIOTOMY Right 05/30/2019   Procedure: FASCIOTOMY;  Surgeon: [name], MD;  Location: [facility];  Service: Vascular;  Laterality: Right;   FASCIOTOMY CLOSURE Right 06/04/2019   Procedure: FASCIOTOMY CLOSURE;  Surgeon: [name], MD;  Location: [facility];  Service: Vascular;  Laterality: Right;   HEMORROIDECTOMY  2014   LOWER EXTREMITY ANGIOGRAPHY Right 05/30/2019   Procedure: LOWER EXTREMITY ANGIOGRAPHY;  Surgeon: [name], MD;  Location: [place] [facility];  Service: Cardiovascular;  Laterality: Right;   LOWER EXTREMITY ANGIOGRAPHY Right 07/02/2020   Procedure: LOWER EXTREMITY ANGIOGRAPHY;  Surgeon: [name], MD;  Location: [place] [facility];  Service: Cardiovascular;  Laterality: Right;   PERIPHERAL VASCULAR CATHETERIZATION N/A 02/09/2016   Procedure: Abdominal Aortogram w/Lower Extremity;  Surgeon: Belenda Cruise  Eloise Levels, MD;  Location: [place] [facility];  Service: Cardiovascular;  Laterality: N/A;   PERIPHERAL VASCULAR CATHETERIZATION Right 02/10/2016   Procedure: Lower Extremity Angiography;  Surgeon: [name], MD;  Location: [place] [facility];  Service: Cardiovascular;  Laterality: Right;   POLYPECTOMY  11/05/2017   Procedure: POLYPECTOMY;  Surgeon: Lucilla Lame, MD;  Location: [place];  Service: Endoscopy;;   SHOULDER ARTHROSCOPY WITH ROTATOR CUFF REPAIR AND  SUBACROMIAL DECOMPRESSION Right 02/27/2020   Procedure: RIGHT SHOULDER ARTHROSCOPY SUBACROMIAL DECOMPRESSION, DISTAL CLAVICLE EXCISION AND MINI-OPEN ROTATOR CUFF REPAIR;  Surgeon: Thornton Park, MD;  Location: [facility];  Service: Orthopedics;  Laterality: Right;   TONSILLECTOMY     VASCULAR SURGERY  [id number],9604   Fem-Pop Bypass    There were no vitals filed for this visit.   Subjective Assessment - 03/16/21 1504     Subjective Pt states she is doing well today. Denies pain in RLE. She states she kept prosthesis off for 2 days after last session and that redness decreased so she returned to wearing prosthesis per usual on Monday. Pt states she did take pain medication prior to coming to therapy due to shoulder pain; she now reports 0/10 pain in shoulder.    Pertinent History Pt has a history of RLE vascular issues since 1991. 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 She received her RLE prosthetic in April 2022 and has been receiving [facility] since her surgery. They discharged her yesterday to continue with OP PT. She continues with phantom RLE pain, especially at night and takes gabapentin with minimal relief. She has also been having R shoulder pain since the surgery which she attributes to using a wheelchair. She underwent R RTC surgery in September of 2021 and had physical therapy afterwards. She has been having issues with bilateral carpal tunnel and has a NCV study scheduled for tomorrow.    Limitations Walking    Patient Stated Goals Ambulate with her prosthetic    Currently in Pain? No/denies                TREATMENT     Ther-ex  With prosthetic doffed: Supine R SLR hip flexion with manual resistance from therapist 2 x 20; Supine RLE bridge with residual RLE resting on stool (towel on top for cushion), arms across chest, 3 second hold, 2 x 10; Supine RLE long axis manually resisted external and internal rotation 2 x 20 each; L sidelying R hip  abduction with manual resistance 2 x 20; L sidelying R hip extension from 90 hip flexion to available end range extension with manual resistance 2 x 20;     Neuromuscular Re-education  With prosthetic doffed:   Seated on BOSU (round side up) on mat table without LLE on ground 6# med ball diagonal lifts,sagittal plane lifts, and trunk rotation with shoulder at 90 deg and elbows ext. x 10 toward each side;   Gait 87f x 4 reps using SPC. VC to increase LLE stride length and greater R hip flexion to assist with R toe clearance. CGA throughout; 1 LOB requiring MOD A, pt also reached out to wall.      Pt educated throughout session about proper posture and technique with exercises. Improved exercise technique, movement at target joints, use of target muscles after min to mod verbal, visual, tactile cues.      Pt demonstrates excellent motivation during today's treatment. Integumentary over residual limb was assessed due to  redness observed during last session. Pt did present with minimal redness at distal end of R residual limb; it was circular in shape resembling the inside of her liner. 2 sets of each exercises were performed today rather than 3 to allow time for gait training. Pt did demonstrate moderate muscular fatigue upon completion of 2nd set of each exercise. PT focused on equalizing stride length bilaterally during gait training. Pt had difficulty clearing R toe during swing phase when LLE completed a full step-through. Gait training was ended due to fatigue and decreased safety as well as end of session. Pt was safe walking out of the office using RW. Pt. will continue to benefit from skilled physical therapy to progress POC to address remaining deficits to facilitate maximum functional capacity for optimal personal health and wellness for ADLs.          PT Short Term Goals - 03/02/21 0959       PT SHORT TERM GOAL #1   Title Pt will be independent with HEP in order to improve  strength and balance in order to decrease fall risk and improve function at home.    Time 4    Period Weeks    Status On-going    Target Date 02/23/21               PT Long Term Goals - 03/02/21 1000       PT LONG TERM GOAL #1   Title Pt will improve BERG by at least 3 points in order to demonstrate clinically significant improvement in balance.    Baseline 01/26/21: 45/56; 03/02/21: 47/56    Time 8    Period Weeks    Status Partially Met    Target Date 03/23/21      PT LONG TERM GOAL #2   Title Pt will improve ABC by at least 13% in order to demonstrate clinically significant improvement in balance confidence.    Baseline 01/26/21: 57.5%; 03/02/21: 89.4%    Time 8    Period Weeks    Status Achieved      PT LONG TERM GOAL #3   Title Pt will improve FOTO to at least 63 in order to demonstrate clinically significant improvement in function related to her RLE amputation    Baseline 01/26/21: 59; 03/02/21: 71    Time 8    Period Weeks    Status Achieved    Target Date --      PT LONG TERM GOAL #4   Title Pt will increase self-selected 10MWT by at least 0.13 m/s in order to demonstrate clinically significant improvement in community ambulation.    Baseline 01/26/21: 31.5s = 0.32 m/s; 03/02/21: 25.5s = 0.39 m/s;    Time 8    Period Weeks    Status Partially Met    Target Date 03/23/21                   Plan - 03/16/21 1524     Clinical Impression Statement Pt demonstrates excellent motivation during today's treatment. Integumentary over residual limb was assessed due to redness observed during last session. Pt did present with minimal redness at distal end of R residual limb; it was circular in shape resembling the inside of her liner. 2 sets of each exercises were performed today rather than 3 to allow time for gait training. Pt did demonstrate moderate muscular fatigue upon completion of 2nd set of each exercise. PT focused on equalizing stride length bilaterally during  gait  training. Pt had difficulty clearing R toe during swing phase when LLE completed a full step-through. Gait training was ended due to fatigue and decreased safety as well as end of session. Pt was safe walking out of the office using RW. Pt. will continue to benefit from skilled physical therapy to progress POC to address remaining deficits to facilitate maximum functional capacity for optimal personal health and wellness for ADLs.    Personal Factors and Comorbidities Age;Comorbidity 3+    Comorbidities Depression, osteoporosis, vascular disease    Examination-Activity Limitations Bathing;Locomotion Level;Squat;Stairs;Stand;Transfers    Examination-Participation Restrictions Cleaning;Community Activity;Meal Prep;Yard Work    Stability/Clinical Decision Making Unstable/Unpredictable    Rehab Potential Good    PT Frequency 2x / week    PT Duration 12 weeks    PT Treatment/Interventions ADLs/Self Care Home Management;Aquatic Therapy;Canalith Repostioning;Cryotherapy;Electrical Stimulation;Iontophoresis 47m/ml Dexamethasone;Moist Heat;Traction;Ultrasound;Gait training;Stair training;Therapeutic activities;Therapeutic exercise;Neuromuscular re-education;Manual techniques;Dry needling;Vestibular;Spinal Manipulations;Joint Manipulations    PT Next Visit Plan practice gait training, issue HEP, strengthening, prone hip flexor stretch    PT Home Exercise Plan Access Code: [email or web address]    Consulted and Agree with Plan of Care Patient             Patient will benefit from skilled therapeutic intervention in order to improve the following deficits and impairments:  Abnormal gait, Decreased strength, Difficulty walking  Visit Diagnosis: Muscle weakness (generalized)  Unsteadiness on feet  Difficulty in walking, not elsewhere classified     Problem List Patient Active Problem List   Diagnosis Date Noted   Muscle spasm 09/09/2020   S/P AKA (above knee amputation), right ([place] 09/08/2020    Ischemia of extremity 07/02/2020   Chronic pain in right shoulder 11/25/2019   Encounter for long-term (current) use of high-risk medication 07/16/2019   GCA (giant cell arteritis) ([place] 07/16/2019   Temporal arteritis ([place] 07/13/2019   Postoperative wound infection 06/23/2019   Ischemic leg 05/31/2019   Atherosclerosis of native arteries of extremity with intermittent claudication ([place] 05/30/2019   Depression 05/29/2019   Eczema of lower extremity 03/04/2018   Chronic venous insufficiency 03/02/2018   Personal history of colonic polyps    Family history of colonic polyps    Benign neoplasm of ascending colon    Mitral valve insufficiency 02/08/2017   Dyspnea on exertion 02/07/2017   Non-rheumatic mitral regurgitation 02/07/2017   Precordial pain 02/07/2017   CAD (coronary artery disease) 12/21/2016   Essential hypertension 12/21/2016   Compression fracture of lumbar spine, non-traumatic ([place] 04/26/2016   Compression fracture of L3 lumbar vertebra 04/20/2016   PVD (peripheral vascular disease) ([facility] [place] 02/24/2016   Family history of premature CAD 02/24/2016   Gastritis    Other specified diseases of esophagus    Hiatal hernia    Gastritis and gastroduodenitis    Ischemia of lower extremity    Arterial occlusion (H[facility])    Atherosclerosis of aorta ([place]    History of smoking    Hyperlipidemia    Pain in the chest    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 02/05/2016   Carotid artery stenosis 01/04/2016   Vertigo, benign paroxysmal 12/28/2015   Clinical depression 09/06/2015   History of alcoholism ([place] 09/06/2015   Angiopathy, 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[facility] 09/06/2015   Atherosclerosis of native arteries of extremity with rest pain ([place] 09/06/2015   Acute non-recurrent maxillary sinusitis 07/14/2015   Vaginal pruritus 05/26/2015   Chronic recurrent major depressive disorder ([place] 03/09/2015   Osteoporosis, post-menopausal 03/09/2015   Peripheral blood vessel  disorder ([place]  03/09/2015   Acid reflux 03/09/2015   GERD (gastroesophageal reflux disease) 12/21/2014   Carotid artery narrowing 01/28/2014   Peripheral arterial occlusive disease ([place]) 06/08/2011   Occlusion and stenosis of unspecified carotid artery 06/08/2011   PAD (peripheral artery disease) ([place]) 06/08/2011   Patrina Levering PT, DPT  [facility] 03/16/2021, 3:29 PM  [facility] [address] [place], [place], [address] Phone: [phone]   Fax:  [phone]  Name: Sara [name] MRN: [id number] Date of Birth: August 31, 1950

## 2021-03-18 ENCOUNTER — Ambulatory Visit: Payer: Medicare Other

## 2021-03-18 ENCOUNTER — Other Ambulatory Visit: Payer: Self-pay

## 2021-03-18 DIAGNOSIS — M6281 Muscle weakness (generalized): Secondary | ICD-10-CM

## 2021-03-18 DIAGNOSIS — R2681 Unsteadiness on feet: Secondary | ICD-10-CM

## 2021-03-18 NOTE — Therapy (Signed)
[facility] [address] [place], [place], [address] Phone: [phone]   Fax:  [phone]  Physical Therapy Treatment  Patient Details  Name: Sara [name] MRN: [id number] Date of Birth: 03-08-1951 Referring Provider (PT): Dr. Delana Meyer   Encounter Date: 03/18/2021   PT End of Session - 03/18/21 1010     Visit Number 15    Number of Visits 25    Date for PT Re-Evaluation 04/20/21    Authorization Type eval: 01/26/21    PT Start Time 1015    PT Stop Time 1100    PT Time Calculation (min) 45 min    Equipment Utilized During Treatment Gait belt    Activity Tolerance Patient tolerated treatment well    Behavior During Therapy [facility] for tasks assessed/performed              Past Medical History:  Diagnosis Date   Arthritis    right shoulder   Depression    Dyspnea    GERD (gastroesophageal reflux disease)    History of blood clots    Hyperlipidemia    Hypertension    Mild mitral regurgitation    Osteoporosis    Peripheral vascular disease ([place])    Stomach ulcer    Vascular disease    Sees Dr. Delana Meyer   Vertigo    Last episode approx Aug 2015   Wears dentures    full upper    Past Surgical History:  Procedure Laterality Date   ADENOIDECTOMY     AMPUTATION Right 08/11/2020   Procedure: AMPUTATION ABOVE KNEE;  Surgeon: [name], MD;  Location: [facility];  Service: Vascular;  Laterality: Right;   ARTERY BIOPSY Right 07/14/2019   Procedure: BIOPSY TEMPORAL ARTERY;  Surgeon: [name], MD;  Location: [facility];  Service: Vascular;  Laterality: Right;   BREAST CYST ASPIRATION Left    CARDIAC CATHETERIZATION  02/15/2017   [facility]   COLONOSCOPY WITH PROPOFOL N/A 10/22/2017   Procedure: COLONOSCOPY WITH PROPOFOL;  Surgeon: Lucilla Lame, MD;  Location: [place];  Service: Endoscopy;  Laterality: N/A;  specimens not taken--pt on Plavix will be brought back in after 7 days off med   COLONOSCOPY WITH  PROPOFOL N/A 11/05/2017   Procedure: COLONOSCOPY WITH PROPOFOL;  Surgeon: Lucilla Lame, MD;  Location: [place];  Service: Endoscopy;  Laterality: N/A;   ESOPHAGOGASTRODUODENOSCOPY N/A 12/21/2014   Procedure: ESOPHAGOGASTRODUODENOSCOPY (EGD);  Surgeon: Lucilla Lame, MD;  Location: [place];  Service: Gastroenterology;  Laterality: N/A;   ESOPHAGOGASTRODUODENOSCOPY (EGD) WITH PROPOFOL N/A 02/08/2016   Procedure: ESOPHAGOGASTRODUODENOSCOPY (EGD) WITH PROPOFOL;  Surgeon: Lucilla Lame, MD;  Location: [facility];  Service: Endoscopy;  Laterality: N/A;   FASCIOTOMY Right 05/30/2019   Procedure: FASCIOTOMY;  Surgeon: [name], MD;  Location: [facility];  Service: Vascular;  Laterality: Right;   FASCIOTOMY CLOSURE Right 06/04/2019   Procedure: FASCIOTOMY CLOSURE;  Surgeon: [name], MD;  Location: [facility];  Service: Vascular;  Laterality: Right;   HEMORROIDECTOMY  2014   LOWER EXTREMITY ANGIOGRAPHY Right 05/30/2019   Procedure: LOWER EXTREMITY ANGIOGRAPHY;  Surgeon: [name], MD;  Location: [place] [facility];  Service: Cardiovascular;  Laterality: Right;   LOWER EXTREMITY ANGIOGRAPHY Right 07/02/2020   Procedure: LOWER EXTREMITY ANGIOGRAPHY;  Surgeon: [name], MD;  Location: [place] [facility];  Service: Cardiovascular;  Laterality: Right;   PERIPHERAL VASCULAR CATHETERIZATION N/A 02/09/2016   Procedure: Abdominal Aortogram w/Lower Extremity;  Surgeon:  [name], MD;  Location: [place] [facility];  Service: Cardiovascular;  Laterality: N/A;   PERIPHERAL VASCULAR CATHETERIZATION Right 02/10/2016   Procedure: Lower Extremity Angiography;  Surgeon: [name], MD;  Location: [place] [facility];  Service: Cardiovascular;  Laterality: Right;   POLYPECTOMY  11/05/2017   Procedure: POLYPECTOMY;  Surgeon: Lucilla Lame, MD;  Location: [place];  Service: Endoscopy;;   SHOULDER ARTHROSCOPY WITH ROTATOR CUFF REPAIR AND  SUBACROMIAL DECOMPRESSION Right 02/27/2020   Procedure: RIGHT SHOULDER ARTHROSCOPY SUBACROMIAL DECOMPRESSION, DISTAL CLAVICLE EXCISION AND MINI-OPEN ROTATOR CUFF REPAIR;  Surgeon: Thornton Park, MD;  Location: [facility];  Service: Orthopedics;  Laterality: Right;   TONSILLECTOMY     VASCULAR SURGERY  7989,[id number]   Fem-Pop Bypass    There were no vitals filed for this visit.   Subjective Assessment - 03/18/21 1010     Subjective Pt states she is doing well today. Denies pain in RLE and no falls since last therapy session. States that the redness in her R residual limb has resolved. Pt states she did take pain medication prior to coming to therapy due to shoulder pain but it is not hurting now. No specific questions or concerns currently.    Pertinent History Pt has a history of RLE vascular issues since 1991. 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 She received her RLE prosthetic in April 2022 and has been receiving [place] [facility] since her surgery. They discharged her yesterday to continue with OP PT. She continues with phantom RLE pain, especially at night and takes gabapentin with minimal relief. She has also been having R shoulder pain since the surgery which she attributes to using a wheelchair. She underwent R RTC surgery in September of 2021 and had physical therapy afterwards. She has been having issues with bilateral carpal tunnel and has a NCV study scheduled for tomorrow.    Limitations Walking    Patient Stated Goals Ambulate with her prosthetic             TREATMENT   Ther-ex  With prosthetic donned: NuStep L1-2 x 5 minutes LE only during history;  With prosthetic doffed: Supine R SLR hip flexion with manual resistance from therapist 2 x 20; Supine RLE bridge with residual RLE resting on stool (towel on top for cushion), arms across chest, 3s hold, 2 x 10; Supine RLE long axis manually resisted external and internal rotation 2 x 20  each; Supine RLE adduction with manual resistance 2 x 20; L sidelying R hip abduction with manual resistance 2 x 20; L sidelying R hip extension from 90 hip flexion to available end range extension with manual resistance 2 x 20;   Gait Training Forward gait in // bars without UE support x 6 lengths; Lateral stepping in // bars without UE support x 4 lengths; Backward gait in // bars without UE support x 2 lengths; Gait in rehab gym with single point cane. Verbal cues to increase LLE stride to encourage toe off RLE and improved prosthetic knee flexion during swing phase of RLE. CGA mostly however pt does have one LOB requiring minA+1 to prevent her from falling.   Pt educated throughout session about proper posture and technique with exercises. Improved exercise technique, movement at target joints, use of target muscles after min to mod verbal, visual, tactile cues.      Patient demonstrates excellent motivation during session today. Repeated mat table strengthening for R hip strength/stability. Practiced gait  in parallel bars without upper extremity support as well as lateral stepping and single point cane training in rehab gym.  Verbal cues provided during gait training to increase LLE stride to encourage toe off RLE and improve prosthetic knee flexion during swing phase of RLE. CGA mostly however pt does have one LOB requiring minA+1 to prevent her from falling.  Patient encouraged to follow-up as scheduled. Pt will benefit from PT services to address deficits in strength, balance, and mobility in order to return to full function at home.                             PT Short Term Goals - 03/02/21 0959       PT SHORT TERM GOAL #1   Title Pt will be independent with HEP in order to improve strength and balance in order to decrease fall risk and improve function at home.    Time 4    Period Weeks    Status On-going    Target Date 02/23/21               PT  Long Term Goals - 03/02/21 1000       PT LONG TERM GOAL #1   Title Pt will improve BERG by at least 3 points in order to demonstrate clinically significant improvement in balance.    Baseline 01/26/21: 45/56; 03/02/21: 47/56    Time 8    Period Weeks    Status Partially Met    Target Date 03/23/21      PT LONG TERM GOAL #2   Title Pt will improve ABC by at least 13% in order to demonstrate clinically significant improvement in balance confidence.    Baseline 01/26/21: 57.5%; 03/02/21: 89.4%    Time 8    Period Weeks    Status Achieved      PT LONG TERM GOAL #3   Title Pt will improve FOTO to at least 63 in order to demonstrate clinically significant improvement in function related to her RLE amputation    Baseline 01/26/21: 59; 03/02/21: 71    Time 8    Period Weeks    Status Achieved    Target Date --      PT LONG TERM GOAL #4   Title Pt will increase self-selected 10MWT by at least 0.13 m/s in order to demonstrate clinically significant improvement in community ambulation.    Baseline 01/26/21: 31.5s = 0.32 m/s; 03/02/21: 25.5s = 0.39 m/s;    Time 8    Period Weeks    Status Partially Met    Target Date 03/23/21                   Plan - 03/18/21 1010     Clinical Impression Statement Patient demonstrates excellent motivation during session today. Repeated mat table strengthening for R hip strength/stability. Practiced gait in parallel bars without upper extremity support as well as lateral stepping and single point cane training in rehab gym.  Verbal cues provided during gait training to increase LLE stride to encourage toe off RLE and improve prosthetic knee flexion during swing phase of RLE. CGA mostly however pt does have one LOB requiring minA+1 to prevent her from falling.  Patient encouraged to follow-up as scheduled. Pt will benefit from PT services to address deficits in strength, balance, and mobility in order to return to full function at home.    Personal Factors and  Comorbidities Age;Comorbidity 3+    Comorbidities Depression, osteoporosis, vascular disease    Examination-Activity Limitations Bathing;Locomotion Level;Squat;Stairs;Stand;Transfers    Examination-Participation Restrictions Cleaning;Community Activity;Meal Prep;Yard Work    Stability/Clinical Decision Making Unstable/Unpredictable    Rehab Potential Good    PT Frequency 2x / week    PT Duration 12 weeks    PT Treatment/Interventions ADLs/Self Care Home Management;Aquatic Therapy;Canalith Repostioning;Cryotherapy;Electrical Stimulation;Iontophoresis 27m/ml Dexamethasone;Moist Heat;Traction;Ultrasound;Gait training;Stair training;Therapeutic activities;Therapeutic exercise;Neuromuscular re-education;Manual techniques;Dry needling;Vestibular;Spinal Manipulations;Joint Manipulations    PT Next Visit Plan practice gait training, issue HEP, strengthening, prone hip flexor stretch    PT Home Exercise Plan Access Code: [email or web address]    Consulted and Agree with Plan of Care Patient              Patient will benefit from skilled therapeutic intervention in order to improve the following deficits and impairments:  Abnormal gait, Decreased strength, Difficulty walking  Visit Diagnosis: Muscle weakness (generalized)  Unsteadiness on feet     Problem List Patient Active Problem List   Diagnosis Date Noted   Muscle spasm 09/09/2020   S/P AKA (above knee amputation), right ([facility] 09/08/2020   Ischemia of extremity 07/02/2020   Chronic pain in right shoulder 11/25/2019   Encounter for long-term (current) use of high-risk medication 07/16/2019   GCA (giant cell arteritis) ([facility] 07/16/2019   Temporal arteritis ([place] 07/13/2019   Postoperative wound infection 06/23/2019   Ischemic leg 05/31/2019   Atherosclerosis of native arteries of extremity with intermittent claudication ([facility] 05/30/2019   Depression 05/29/2019   Eczema of lower extremity 03/04/2018   Chronic venous insufficiency 03/02/2018    Personal history of colonic polyps    Family history of colonic polyps    Benign neoplasm of ascending colon    Mitral valve insufficiency 02/08/2017   Dyspnea on exertion 02/07/2017   Non-rheumatic mitral regurgitation 02/07/2017   Precordial pain 02/07/2017   CAD (coronary artery disease) 12/21/2016   Essential hypertension 12/21/2016   Compression fracture of lumbar spine, non-traumatic ([facility] 04/26/2016   Compression fracture of L3 lumbar vertebra 04/20/2016   PVD (peripheral vascular disease) ([facility] 02/24/2016   Family history of premature CAD 02/24/2016   Gastritis    Other specified diseases of esophagus    Hiatal hernia    Gastritis and gastroduodenitis    Ischemia of lower extremity    Arterial occlusion (HCC)    Atherosclerosis of aorta (HCC)    History of smoking    Hyperlipidemia    Pain in the chest    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 02/05/2016   Carotid artery stenosis 01/04/2016   Vertigo, benign paroxysmal 12/28/2015   Clinical depression 09/06/2015   History of alcoholism ([facility] 09/06/2015   Angiopathy, 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[facility] 09/06/2015   Atherosclerosis of native arteries of extremity with rest pain ([place] 09/06/2015   Acute non-recurrent maxillary sinusitis 07/14/2015   Vaginal pruritus 05/26/2015   Chronic recurrent major depressive disorder ([place] 03/09/2015   Osteoporosis, post-menopausal 03/09/2015   Peripheral blood vessel disorder ([facility] 03/09/2015   Acid reflux 03/09/2015   GERD (gastroesophageal reflux disease) 12/21/2014   Carotid artery narrowing 01/28/2014   Peripheral arterial occlusive disease ([facility] 06/08/2011   Occlusion and stenosis of unspecified carotid artery 06/08/2011   PAD (peripheral artery disease) ([facility] 06/08/2011   JLyndel SafeHuprich PT, DPT, GCS  Estuardo Frisbee 03/18/2021, 11:38 AM  [place] [facility] [place] [address]Phone: [phone]  Fax:   [phone] Name:  Sara [name] MRN: [id number] Date of Birth: 04/15/1951

## 2021-03-23 ENCOUNTER — Other Ambulatory Visit: Payer: Self-pay

## 2021-03-23 ENCOUNTER — Ambulatory Visit: Payer: Medicare Other | Attending: Vascular Surgery

## 2021-03-23 DIAGNOSIS — R2681 Unsteadiness on feet: Secondary | ICD-10-CM | POA: Diagnosis present

## 2021-03-23 DIAGNOSIS — M6281 Muscle weakness (generalized): Secondary | ICD-10-CM | POA: Diagnosis present

## 2021-03-23 DIAGNOSIS — R262 Difficulty in walking, not elsewhere classified: Secondary | ICD-10-CM | POA: Diagnosis present

## 2021-03-23 NOTE — Therapy (Signed)
[facility] [address]. [place], [place], [address] Phone: [phone]   Fax:  [phone]  Physical Therapy Treatment  Patient Details  Name: Sara [name] MRN: [id number] Date of Birth: 08-23-1950 Referring Provider (PT): Dr. Delana Meyer   Encounter Date: 03/23/2021   PT End of Session - 03/23/21 1217     Visit Number 16    Number of Visits 25    Date for PT Re-Evaluation 04/20/21    Authorization Type eval: 01/26/21    PT Start Time 1025    PT Stop Time 1110    PT Time Calculation (min) 45 min    Equipment Utilized During Treatment Gait belt    Activity Tolerance Patient tolerated treatment well    Behavior During Therapy [facility] for tasks assessed/performed               Past Medical History:  Diagnosis Date   Arthritis    right shoulder   Depression    Dyspnea    GERD (gastroesophageal reflux disease)    History of blood clots    Hyperlipidemia    Hypertension    Mild mitral regurgitation    Osteoporosis    Peripheral vascular disease ([place])    Stomach ulcer    Vascular disease    Sees Dr. Delana Meyer   Vertigo    Last episode approx Aug 2015   Wears dentures    full upper    Past Surgical History:  Procedure Laterality Date   ADENOIDECTOMY     AMPUTATION Right 08/11/2020   Procedure: AMPUTATION ABOVE KNEE;  Surgeon: [name], MD;  Location: [facility];  Service: Vascular;  Laterality: Right;   ARTERY BIOPSY Right 07/14/2019   Procedure: BIOPSY TEMPORAL ARTERY;  Surgeon: [name], MD;  Location: [facility];  Service: Vascular;  Laterality: Right;   BREAST CYST ASPIRATION Left    CARDIAC CATHETERIZATION  02/15/2017   [facility]   COLONOSCOPY WITH PROPOFOL N/A 10/22/2017   Procedure: COLONOSCOPY WITH PROPOFOL;  Surgeon: Lucilla Lame, MD;  Location: [place];  Service: Endoscopy;  Laterality: N/A;  specimens not taken--pt on Plavix will be brought back in after 7 days off med   COLONOSCOPY WITH  PROPOFOL N/A 11/05/2017   Procedure: COLONOSCOPY WITH PROPOFOL;  Surgeon: Lucilla Lame, MD;  Location: [place];  Service: Endoscopy;  Laterality: N/A;   ESOPHAGOGASTRODUODENOSCOPY N/A 12/21/2014   Procedure: ESOPHAGOGASTRODUODENOSCOPY (EGD);  Surgeon: Lucilla Lame, MD;  Location: [place];  Service: Gastroenterology;  Laterality: N/A;   ESOPHAGOGASTRODUODENOSCOPY (EGD) WITH PROPOFOL N/A 02/08/2016   Procedure: ESOPHAGOGASTRODUODENOSCOPY (EGD) WITH PROPOFOL;  Surgeon: Lucilla Lame, MD;  Location: [facility];  Service: Endoscopy;  Laterality: N/A;   FASCIOTOMY Right 05/30/2019   Procedure: FASCIOTOMY;  Surgeon: [name], MD;  Location: [facility];  Service: Vascular;  Laterality: Right;   FASCIOTOMY CLOSURE Right 06/04/2019   Procedure: FASCIOTOMY CLOSURE;  Surgeon: [name], MD;  Location: [facility];  Service: Vascular;  Laterality: Right;   HEMORROIDECTOMY  2014   LOWER EXTREMITY ANGIOGRAPHY Right 05/30/2019   Procedure: LOWER EXTREMITY ANGIOGRAPHY;  Surgeon: [name], MD;  Location: [place] [facility];  Service: Cardiovascular;  Laterality: Right;   LOWER EXTREMITY ANGIOGRAPHY Right 07/02/2020   Procedure: LOWER EXTREMITY ANGIOGRAPHY;  Surgeon: [name], MD;  Location: [place] [facility];  Service: Cardiovascular;  Laterality: Right;   PERIPHERAL VASCULAR CATHETERIZATION N/A 02/09/2016   Procedure: Abdominal Aortogram w/Lower Extremity;  Surgeon: [name], MD;  Location: [place] [facility];  Service: Cardiovascular;  Laterality: N/A;   PERIPHERAL VASCULAR CATHETERIZATION Right 02/10/2016   Procedure: Lower Extremity Angiography;  Surgeon: [name], MD;  Location: [place] [facility];  Service: Cardiovascular;  Laterality: Right;   POLYPECTOMY  11/05/2017   Procedure: POLYPECTOMY;  Surgeon: Lucilla Lame, MD;  Location: [place];  Service: Endoscopy;;   SHOULDER ARTHROSCOPY WITH ROTATOR CUFF REPAIR AND  SUBACROMIAL DECOMPRESSION Right 02/27/2020   Procedure: RIGHT SHOULDER ARTHROSCOPY SUBACROMIAL DECOMPRESSION, DISTAL CLAVICLE EXCISION AND MINI-OPEN ROTATOR CUFF REPAIR;  Surgeon: Thornton Park, MD;  Location: [facility];  Service: Orthopedics;  Laterality: Right;   TONSILLECTOMY     VASCULAR SURGERY  [phone]   Fem-Pop Bypass    There were no vitals filed for this visit.   Subjective Assessment - 03/23/21 1031     Subjective Pt states she is doing well today. Denies pain in RLE upon arrival. She fell the other day onto pillows while she was waking her bed. She did not injure herself. Pt states she did take pain medication prior to coming to therapy due to her R shoulder pain which she rates as 8/10 upon arrival. No specific questions or concerns currently.    Pertinent History Pt has a history of RLE vascular issues since 1991. 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 She received her RLE prosthetic in April 2022 and has been receiving [facility] since her surgery. They discharged her yesterday to continue with OP PT. She continues with phantom RLE pain, especially at night and takes gabapentin with minimal relief. She has also been having R shoulder pain since the surgery which she attributes to using a wheelchair. She underwent R RTC surgery in September of 2021 and had physical therapy afterwards. She has been having issues with bilateral carpal tunnel and has a NCV study scheduled for tomorrow.    Limitations Walking    Patient Stated Goals Ambulate with her prosthetic               TREATMENT   Ther-ex  With prosthetic donned: NuStep L1-2 x 5 minutes BLE only during history;  With prosthetic doffed: Supine R SLR hip flexion with manual resistance from therapist 2 x 20; Supine RLE bridge with residual RLE resting on stool (towel on top for cushion), arms at side, 3s hold, 2 x 10; Supine RLE long axis manually resisted external and internal  rotation 2 x 20 each; Supine RLE adduction with manual resistance 2 x 20; L sidelying R hip abduction with manual resistance 2 x 20; L sidelying R hip extension from 90 hip flexion to available end range extension with manual resistance 2 x 20;   Gait Training Gait in rehab gym and outside (grass, mulch, concrete, curb) with bilateral Lofstrand crutches. Pt provided demonstration and education about how to use crutches. Verbal cues to increase LLE stride to encourage toe off RLE and improved prosthetic knee flexion during swing phase of RLE. She is able to ambulate during entire session without any LOB or stumbles. Pt reports that the Lofstrand crutches are easier to use than her walker. Encouraged pt to go to medical supply store to discuss possible insurance coverage for crutches.    Pt educated throughout session about proper posture and technique with exercises. Improved exercise technique, movement at target joints, use of target muscles after min to mod verbal, visual, tactile cues.  Patient demonstrates excellent motivation during session today. Repeated mat table strengthening for R hip strength/stability.  Patient reports she is mostly ambulating around the house with a single-point cane and is tired of using her walker.  She would like to know when she can start walking exclusively with a single-point cane.  Given consistent loss of balance at least once during all therapy sessions when ambulating with a single-point cane patient encouraged to try Lofstrand crutches.  Practiced gait in rehab gym and outside (grass, mulch, concrete, curb) with bilateral Lofstrand crutches. Pt provided demonstration and education about how to use crutches. Verbal cues to increase LLE stride to encourage toe off RLE and improved prosthetic knee flexion during swing phase of RLE. She is able to ambulate during entire session without any LOB or stumbles. Pt reports that the Lofstrand crutches are easier to use  than her walker. Encouraged pt to go to medical supply store to discuss possible insurance coverage for crutches. Patient encouraged to follow-up as scheduled. Pt will benefit from PT services to address deficits in strength, balance, and mobility in order to return to full function at home.                            PT Short Term Goals - 03/02/21 0959       PT SHORT TERM GOAL #1   Title Pt will be independent with HEP in order to improve strength and balance in order to decrease fall risk and improve function at home.    Time 4    Period Weeks    Status On-going    Target Date 02/23/21               PT Long Term Goals - 03/02/21 1000       PT LONG TERM GOAL #1   Title Pt will improve BERG by at least 3 points in order to demonstrate clinically significant improvement in balance.    Baseline 01/26/21: 45/56; 03/02/21: 47/56    Time 8    Period Weeks    Status Partially Met    Target Date 03/23/21      PT LONG TERM GOAL #2   Title Pt will improve ABC by at least 13% in order to demonstrate clinically significant improvement in balance confidence.    Baseline 01/26/21: 57.5%; 03/02/21: 89.4%    Time 8    Period Weeks    Status Achieved      PT LONG TERM GOAL #3   Title Pt will improve FOTO to at least 63 in order to demonstrate clinically significant improvement in function related to her RLE amputation    Baseline 01/26/21: 59; 03/02/21: 71    Time 8    Period Weeks    Status Achieved    Target Date --      PT LONG TERM GOAL #4   Title Pt will increase self-selected 10MWT by at least 0.13 m/s in order to demonstrate clinically significant improvement in community ambulation.    Baseline 01/26/21: 31.5s = 0.32 m/s; 03/02/21: 25.5s = 0.39 m/s;    Time 8    Period Weeks    Status Partially Met    Target Date 03/23/21                   Plan - 03/23/21 1218     Clinical Impression Statement Patient demonstrates excellent motivation during  session today. Repeated mat table strengthening for  R hip strength/stability.  Patient reports she is mostly ambulating around the house with a single-point cane and is tired of using her walker.  She would like to know when she can start walking exclusively with a single-point cane.  Given consistent loss of balance at least once during all therapy sessions when ambulating with a single-point cane patient encouraged to try Lofstrand crutches.  Practiced gait in rehab gym and outside (grass, mulch, concrete, curb) with bilateral Lofstrand crutches. Pt provided demonstration and education about how to use crutches. Verbal cues to increase LLE stride to encourage toe off RLE and improved prosthetic knee flexion during swing phase of RLE. She is able to ambulate during entire session without any LOB or stumbles. Pt reports that the Lofstrand crutches are easier to use than her walker. Encouraged pt to go to medical supply store to discuss possible insurance coverage for crutches. Patient encouraged to follow-up as scheduled. Pt will benefit from PT services to address deficits in strength, balance, and mobility in order to return to full function at home.    Personal Factors and Comorbidities Age;Comorbidity 3+    Comorbidities Depression, osteoporosis, vascular disease    Examination-Activity Limitations Bathing;Locomotion Level;Squat;Stairs;Stand;Transfers    Examination-Participation Restrictions Cleaning;Community Activity;Meal Prep;Yard Work    Stability/Clinical Decision Making Unstable/Unpredictable    Rehab Potential Good    PT Frequency 2x / week    PT Duration 12 weeks    PT Treatment/Interventions ADLs/Self Care Home Management;Aquatic Therapy;Canalith Repostioning;Cryotherapy;Electrical Stimulation;Iontophoresis 5m/ml Dexamethasone;Moist Heat;Traction;Ultrasound;Gait training;Stair training;Therapeutic activities;Therapeutic exercise;Neuromuscular re-education;Manual techniques;Dry  needling;Vestibular;Spinal Manipulations;Joint Manipulations    PT Next Visit Plan strengthening, practice gait training with lofstrand crutches    PT Home Exercise Plan Access Code: [email or web address]    Consulted and Agree with Plan of Care Patient               Patient will benefit from skilled therapeutic intervention in order to improve the following deficits and impairments:  Abnormal gait, Decreased strength, Difficulty walking  Visit Diagnosis: Muscle weakness (generalized)  Unsteadiness on feet     Problem List Patient Active Problem List   Diagnosis Date Noted   Muscle spasm 09/09/2020   S/P AKA (above knee amputation), right ([place] 09/08/2020   Ischemia of extremity 07/02/2020   Chronic pain in right shoulder 11/25/2019   Encounter for long-term (current) use of high-risk medication 07/16/2019   GCA (giant cell arteritis) ([place] 07/16/2019   Temporal arteritis ([place] 07/13/2019   Postoperative wound infection 06/23/2019   Ischemic leg 05/31/2019   Atherosclerosis of native arteries of extremity with intermittent claudication ([place] 05/30/2019   Depression 05/29/2019   Eczema of lower extremity 03/04/2018   Chronic venous insufficiency 03/02/2018   Personal history of colonic polyps    Family history of colonic polyps    Benign neoplasm of ascending colon    Mitral valve insufficiency 02/08/2017   Dyspnea on exertion 02/07/2017   Non-rheumatic mitral regurgitation 02/07/2017   Precordial pain 02/07/2017   CAD (coronary artery disease) 12/21/2016   Essential hypertension 12/21/2016   Compression fracture of lumbar spine, non-traumatic ([place] 04/26/2016   Compression fracture of L3 lumbar vertebra 04/20/2016   PVD (peripheral vascular disease) ([place] 02/24/2016   Family history of premature CAD 02/24/2016   Gastritis    Other specified diseases of esophagus    Hiatal hernia    Gastritis and gastroduodenitis    Ischemia of lower extremity    Arterial occlusion ([place]     Atherosclerosis of aorta ([place]  History of smoking    Hyperlipidemia    Pain in the chest    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 02/05/2016   Carotid artery stenosis 01/04/2016   Vertigo, benign paroxysmal 12/28/2015   Clinical depression 09/06/2015   History of alcoholism ([place]) 09/06/2015   Angiopathy, 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[place]) 09/06/2015   Atherosclerosis of native arteries of extremity with rest pain ([place]) 09/06/2015   Acute non-recurrent maxillary sinusitis 07/14/2015   Vaginal pruritus 05/26/2015   Chronic recurrent major depressive disorder ([place]) 03/09/2015   Osteoporosis, post-menopausal 03/09/2015   Peripheral blood vessel disorder ([place]) 03/09/2015   Acid reflux 03/09/2015   GERD (gastroesophageal reflux disease) 12/21/2014   Carotid artery narrowing 01/28/2014   Peripheral arterial occlusive disease ([place]) 06/08/2011   Occlusion and stenosis of unspecified carotid artery 06/08/2011   PAD (peripheral artery disease) ([place]) 06/08/2011   Phillips Grout PT, DPT, GCS  Maryan Sivak 03/23/2021, 12:25 PM  [facility] [address]. [place], [place], [address] Phone: [phone]   Fax:  [phone]  Name: Sara [name] MRN: [id number] Date of Birth: May 07, 1951

## 2021-03-25 ENCOUNTER — Other Ambulatory Visit: Payer: Self-pay

## 2021-03-25 ENCOUNTER — Ambulatory Visit: Payer: Medicare Other | Admitting: Physical Therapy

## 2021-03-25 DIAGNOSIS — R2681 Unsteadiness on feet: Secondary | ICD-10-CM

## 2021-03-25 DIAGNOSIS — M6281 Muscle weakness (generalized): Secondary | ICD-10-CM

## 2021-03-25 DIAGNOSIS — R262 Difficulty in walking, not elsewhere classified: Secondary | ICD-10-CM

## 2021-03-25 NOTE — Therapy (Signed)
[facility] [address] [place], [place], [address] Phone: [phone]   Fax:  [phone]  Physical Therapy Treatment  Patient Details  Name: Sara [name] MRN: [id number] Date of Birth: March 16, 1951 Referring Provider (PT): Dr. Delana Meyer   Encounter Date: 03/25/2021   PT End of Session - 03/25/21 1114     Visit Number 17    Number of Visits 25    Date for PT Re-Evaluation 04/20/21    Authorization Type eval: 01/26/21    PT Start Time 1020    PT Stop Time 1102    PT Time Calculation (min) 42 min    Equipment Utilized During Treatment Gait belt    Activity Tolerance Patient tolerated treatment well    Behavior During Therapy [facility] for tasks assessed/performed             Past Medical History:  Diagnosis Date   Arthritis    right shoulder   Depression    Dyspnea    GERD (gastroesophageal reflux disease)    History of blood clots    Hyperlipidemia    Hypertension    Mild mitral regurgitation    Osteoporosis    Peripheral vascular disease ([place])    Stomach ulcer    Vascular disease    Sees Dr. Delana Meyer   Vertigo    Last episode approx Aug 2015   Wears dentures    full upper    Past Surgical History:  Procedure Laterality Date   ADENOIDECTOMY     AMPUTATION Right 08/11/2020   Procedure: AMPUTATION ABOVE KNEE;  Surgeon: [name], MD;  Location: [facility];  Service: Vascular;  Laterality: Right;   ARTERY BIOPSY Right 07/14/2019   Procedure: BIOPSY TEMPORAL ARTERY;  Surgeon: [name], MD;  Location: [facility];  Service: Vascular;  Laterality: Right;   BREAST CYST ASPIRATION Left    CARDIAC CATHETERIZATION  02/15/2017   [facility]   COLONOSCOPY WITH PROPOFOL N/A 10/22/2017   Procedure: COLONOSCOPY WITH PROPOFOL;  Surgeon: Lucilla Lame, MD;  Location: [place];  Service: Endoscopy;  Laterality: N/A;  specimens not taken--pt on Plavix will be brought back in after 7 days off med   COLONOSCOPY WITH  PROPOFOL N/A 11/05/2017   Procedure: COLONOSCOPY WITH PROPOFOL;  Surgeon: Lucilla Lame, MD;  Location: [place];  Service: Endoscopy;  Laterality: N/A;   ESOPHAGOGASTRODUODENOSCOPY N/A 12/21/2014   Procedure: ESOPHAGOGASTRODUODENOSCOPY (EGD);  Surgeon: Lucilla Lame, MD;  Location: [place];  Service: Gastroenterology;  Laterality: N/A;   ESOPHAGOGASTRODUODENOSCOPY (EGD) WITH PROPOFOL N/A 02/08/2016   Procedure: ESOPHAGOGASTRODUODENOSCOPY (EGD) WITH PROPOFOL;  Surgeon: Lucilla Lame, MD;  Location: [facility];  Service: Endoscopy;  Laterality: N/A;   FASCIOTOMY Right 05/30/2019   Procedure: FASCIOTOMY;  Surgeon: [name], MD;  Location: [facility];  Service: Vascular;  Laterality: Right;   FASCIOTOMY CLOSURE Right 06/04/2019   Procedure: FASCIOTOMY CLOSURE;  Surgeon: [name], MD;  Location: [facility];  Service: Vascular;  Laterality: Right;   HEMORROIDECTOMY  2014   LOWER EXTREMITY ANGIOGRAPHY Right 05/30/2019   Procedure: LOWER EXTREMITY ANGIOGRAPHY;  Surgeon: [name], MD;  Location: [place] [facility];  Service: Cardiovascular;  Laterality: Right;   LOWER EXTREMITY ANGIOGRAPHY Right 07/02/2020   Procedure: LOWER EXTREMITY ANGIOGRAPHY;  Surgeon: [name], MD;  Location: [place] [facility];  Service: Cardiovascular;  Laterality: Right;   PERIPHERAL VASCULAR CATHETERIZATION N/A 02/09/2016   Procedure: Abdominal Aortogram w/Lower Extremity;  Surgeon: Belenda Cruise  Eloise Levels, MD;  Location: [place] [facility];  Service: Cardiovascular;  Laterality: N/A;   PERIPHERAL VASCULAR CATHETERIZATION Right 02/10/2016   Procedure: Lower Extremity Angiography;  Surgeon: [name], MD;  Location: [place] [facility];  Service: Cardiovascular;  Laterality: Right;   POLYPECTOMY  11/05/2017   Procedure: POLYPECTOMY;  Surgeon: Lucilla Lame, MD;  Location: [place];  Service: Endoscopy;;   SHOULDER ARTHROSCOPY WITH ROTATOR CUFF REPAIR AND  SUBACROMIAL DECOMPRESSION Right 02/27/2020   Procedure: RIGHT SHOULDER ARTHROSCOPY SUBACROMIAL DECOMPRESSION, DISTAL CLAVICLE EXCISION AND MINI-OPEN ROTATOR CUFF REPAIR;  Surgeon: Thornton Park, MD;  Location: [facility];  Service: Orthopedics;  Laterality: Right;   TONSILLECTOMY     VASCULAR SURGERY  [id number]   Fem-Pop Bypass    There were no vitals filed for this visit.   Subjective Assessment - 03/25/21 1043     Subjective Pt states she is doing well today. She reports concern of redness/rash that has returned at distal residual limb; she noticied it Thursday (yesterday) after she did a lot of walking on Wednesday and earlier Thursday. She denies all pain. Also reports a near fall with her R knee buckling while out in the community - she was using RW and was able to catch herself. Pt states she did take pain medication prior to coming to therapy due to R shoulder pain.    Pertinent History Pt has a history of RLE vascular issues since 1991. 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 She received her RLE prosthetic in April 2022 and has been receiving [facility] since her surgery. They discharged her yesterday to continue with OP PT. She continues with phantom RLE pain, especially at night and takes gabapentin with minimal relief. She has also been having R shoulder pain since the surgery which she attributes to using a wheelchair. She underwent R RTC surgery in September of 2021 and had physical therapy afterwards. She has been having issues with bilateral carpal tunnel and has a NCV study scheduled for tomorrow.    Limitations Walking    Patient Stated Goals Ambulate with her prosthetic    Currently in Pain? No/denies             TREATMENT   Observation: R residual limb presents with mild redness without blanching and signs of capillary disturbance, circular in shape. No discharge, heat. Pt ed was given on continued observation and to contact MD if it got  worse or if she had concerns.     Ther-ex  With prosthetic doffed: Supine R SLR hip flexion with manual resistance from therapist 3 x 20; Supine RLE bridge with residual RLE resting on stool (towel on top for cushion), arms across chest, 3s hold, 3 x 10; Supine RLE long axis manually resisted external and internal rotation 3 x 20 each; L sidelying R hip abduction with manual resistance 3 x 20; L sidelying R hip extension from 90 hip flexion to available end range extension with manual resistance 3 x 20;     Neuromuscular Re-education  With prosthetic donned:   Seated on physioball positioned in // bars, BLE in contact with floor.  - static sitting, hands on lap for 1 minute; - anterior/posterior weight shift with roll within LOS, 1 minute; - lateral weight shift with roll within LOS, 1 minute; - 6# med ball diagonal lifts with trunk rotation x10 each direction, VC for increased rotation;  CGA-MIN A to steady and correct LOB on 4  occasions with LOB towards the left each time (1x in static, 1x in A/P and 2x in diagonal lifts);     Pt arrives with excellent motivation to today's session. Treatment was conducted on mat table with prosthetic limb doffed due to return of increased redness of the distal limb. Pt education was provided for continued observation and to contact MD if pt has any concerns or redness worsens. Increased reps and sets of therex were performed for strengthening. Neuromuscular re-ed was progressed to using a physioball. Pt demo increased difficulty maintaining stability, she required CGA-MIN A to correct on multiple occasions. Pt. will continue to benefit from skilled physical therapy to progress POC to address remaining deficits to facilitate maximum functional capacity for optimal personal health and wellness for ADLs.          PT Short Term Goals - 03/02/21 0959       PT SHORT TERM GOAL #1   Title Pt will be independent with HEP in order to improve strength  and balance in order to decrease fall risk and improve function at home.    Time 4    Period Weeks    Status On-going    Target Date 02/23/21               PT Long Term Goals - 03/02/21 1000       PT LONG TERM GOAL #1   Title Pt will improve BERG by at least 3 points in order to demonstrate clinically significant improvement in balance.    Baseline 01/26/21: 45/56; 03/02/21: 47/56    Time 8    Period Weeks    Status Partially Met    Target Date 03/23/21      PT LONG TERM GOAL #2   Title Pt will improve ABC by at least 13% in order to demonstrate clinically significant improvement in balance confidence.    Baseline 01/26/21: 57.5%; 03/02/21: 89.4%    Time 8    Period Weeks    Status Achieved      PT LONG TERM GOAL #3   Title Pt will improve FOTO to at least 63 in order to demonstrate clinically significant improvement in function related to her RLE amputation    Baseline 01/26/21: 59; 03/02/21: 71    Time 8    Period Weeks    Status Achieved    Target Date --      PT LONG TERM GOAL #4   Title Pt will increase self-selected 10MWT by at least 0.13 m/s in order to demonstrate clinically significant improvement in community ambulation.    Baseline 01/26/21: 31.5s = 0.32 m/s; 03/02/21: 25.5s = 0.39 m/s;    Time 8    Period Weeks    Status Partially Met    Target Date 03/23/21                   Plan - 03/25/21 1115     Clinical Impression Statement Pt arrives with excellent motivation to today's session. Treatment was conducted on mat table with prosthetic limb doffed due to return of increased redness of the distal limb. Pt education was provided for continued observation and to contact MD if pt has any concerns or redness worsens. Increased reps and sets of therex were performed for strengthening. Neuromuscular re-ed was progressed to using a physioball. Pt demo increased difficulty maintaining stability, she required CGA-MIN A to correct on multiple occasions. Pt. will  continue to benefit from skilled physical therapy to progress POC  to address remaining deficits to facilitate maximum functional capacity for optimal personal health and wellness for ADLs.    Personal Factors and Comorbidities Age;Comorbidity 3+    Comorbidities Depression, osteoporosis, vascular disease    Examination-Activity Limitations Bathing;Locomotion Level;Squat;Stairs;Stand;Transfers    Examination-Participation Restrictions Cleaning;Community Activity;Meal Prep;Yard Work    Stability/Clinical Decision Making Unstable/Unpredictable    Rehab Potential Good    PT Frequency 2x / week    PT Duration 12 weeks    PT Treatment/Interventions ADLs/Self Care Home Management;Aquatic Therapy;Canalith Repostioning;Cryotherapy;Electrical Stimulation;Iontophoresis 27m/ml Dexamethasone;Moist Heat;Traction;Ultrasound;Gait training;Stair training;Therapeutic activities;Therapeutic exercise;Neuromuscular re-education;Manual techniques;Dry needling;Vestibular;Spinal Manipulations;Joint Manipulations    PT Next Visit Plan strengthening, practice gait training with lofstrand crutches    PT Home Exercise Plan Access Code: [email or web address]    Consulted and Agree with Plan of Care Patient             Patient will benefit from skilled therapeutic intervention in order to improve the following deficits and impairments:  Abnormal gait, Decreased strength, Difficulty walking  Visit Diagnosis: Muscle weakness (generalized)  Unsteadiness on feet  Difficulty in walking, not elsewhere classified     Problem List Patient Active Problem List   Diagnosis Date Noted   Muscle spasm 09/09/2020   S/P AKA (above knee amputation), right ([place] 09/08/2020   Ischemia of extremity 07/02/2020   Chronic pain in right shoulder 11/25/2019   Encounter for long-term (current) use of high-risk medication 07/16/2019   GCA (giant cell arteritis) ([facility] 07/16/2019   Temporal arteritis ([place] 07/13/2019   Postoperative wound  infection 06/23/2019   Ischemic leg 05/31/2019   Atherosclerosis of native arteries of extremity with intermittent claudication ([facility] 05/30/2019   Depression 05/29/2019   Eczema of lower extremity 03/04/2018   Chronic venous insufficiency 03/02/2018   Personal history of colonic polyps    Family history of colonic polyps    Benign neoplasm of ascending colon    Mitral valve insufficiency 02/08/2017   Dyspnea on exertion 02/07/2017   Non-rheumatic mitral regurgitation 02/07/2017   Precordial pain 02/07/2017   CAD (coronary artery disease) 12/21/2016   Essential hypertension 12/21/2016   Compression fracture of lumbar spine, non-traumatic ([facility] 04/26/2016   Compression fracture of L3 lumbar vertebra 04/20/2016   PVD (peripheral vascular disease) ([facility] [place] 02/24/2016   Family history of premature CAD 02/24/2016   Gastritis    Other specified diseases of esophagus    Hiatal hernia    Gastritis and gastroduodenitis    Ischemia of lower extremity    Arterial occlusion (HCC)    Atherosclerosis of aorta ([facility]    History of smoking    Hyperlipidemia    Pain in the chest    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 02/05/2016   Carotid artery stenosis 01/04/2016   Vertigo, benign paroxysmal 12/28/2015   Clinical depression 09/06/2015   History of alcoholism ([facility] 09/06/2015   Angiopathy, 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[place] 09/06/2015   Atherosclerosis of native arteries of extremity with rest pain ([place] 09/06/2015   Acute non-recurrent maxillary sinusitis 07/14/2015   Vaginal pruritus 05/26/2015   Chronic recurrent major depressive disorder ([place] 03/09/2015   Osteoporosis, post-menopausal 03/09/2015   Peripheral blood vessel disorder ([facility] 03/09/2015   Acid reflux 03/09/2015   GERD (gastroesophageal reflux disease) 12/21/2014   Carotid artery narrowing 01/28/2014   Peripheral arterial occlusive disease ([facility] 06/08/2011   Occlusion and stenosis of unspecified carotid artery 06/08/2011    PAD (peripheral artery disease) ([facility] 06/08/2011    KPatrina LeveringPT, DPT  [facility] [address] [place], [place], [address] Phone: [phone]   Fax:  [phone]  Name: Sara [name] MRN: [id number] Date of Birth: 07/08/1951

## 2021-03-30 ENCOUNTER — Ambulatory Visit: Payer: Medicare Other

## 2021-03-30 ENCOUNTER — Other Ambulatory Visit: Payer: Self-pay

## 2021-03-30 DIAGNOSIS — M6281 Muscle weakness (generalized): Secondary | ICD-10-CM

## 2021-03-30 DIAGNOSIS — R2681 Unsteadiness on feet: Secondary | ICD-10-CM

## 2021-03-30 NOTE — Therapy (Signed)
[facility] [address]. [place], [place], [address] Phone: [phone]   Fax:  [phone]  Physical Therapy Treatment  Patient Details  Name: Sara [name] MRN: [id number] Date of Birth: 1951-02-18 Referring Provider (PT): Dr. Delana Meyer   Encounter Date: 03/30/2021   PT End of Session - 03/30/21 1036     Visit Number 18    Number of Visits 25    Date for PT Re-Evaluation 04/20/21    Authorization Type eval: 01/26/21    PT Start Time 1015    PT Stop Time 1105    PT Time Calculation (min) 50 min    Equipment Utilized During Treatment Gait belt    Activity Tolerance Patient tolerated treatment well    Behavior During Therapy [facility] for tasks assessed/performed               Past Medical History:  Diagnosis Date   Arthritis    right shoulder   Depression    Dyspnea    GERD (gastroesophageal reflux disease)    History of blood clots    Hyperlipidemia    Hypertension    Mild mitral regurgitation    Osteoporosis    Peripheral vascular disease ([place])    Stomach ulcer    Vascular disease    Sees Dr. Delana Meyer   Vertigo    Last episode approx Aug 2015   Wears dentures    full upper    Past Surgical History:  Procedure Laterality Date   ADENOIDECTOMY     AMPUTATION Right 08/11/2020   Procedure: AMPUTATION ABOVE KNEE;  Surgeon: [name], MD;  Location: [facility];  Service: Vascular;  Laterality: Right;   ARTERY BIOPSY Right 07/14/2019   Procedure: BIOPSY TEMPORAL ARTERY;  Surgeon: [name], MD;  Location: [facility];  Service: Vascular;  Laterality: Right;   BREAST CYST ASPIRATION Left    CARDIAC CATHETERIZATION  02/15/2017   [facility]   COLONOSCOPY WITH PROPOFOL N/A 10/22/2017   Procedure: COLONOSCOPY WITH PROPOFOL;  Surgeon: Lucilla Lame, MD;  Location: [place];  Service: Endoscopy;  Laterality: N/A;  specimens not taken--pt on Plavix will be brought back in after 7 days off med   COLONOSCOPY WITH  PROPOFOL N/A 11/05/2017   Procedure: COLONOSCOPY WITH PROPOFOL;  Surgeon: Lucilla Lame, MD;  Location: [place];  Service: Endoscopy;  Laterality: N/A;   ESOPHAGOGASTRODUODENOSCOPY N/A 12/21/2014   Procedure: ESOPHAGOGASTRODUODENOSCOPY (EGD);  Surgeon: Lucilla Lame, MD;  Location: [place];  Service: Gastroenterology;  Laterality: N/A;   ESOPHAGOGASTRODUODENOSCOPY (EGD) WITH PROPOFOL N/A 02/08/2016   Procedure: ESOPHAGOGASTRODUODENOSCOPY (EGD) WITH PROPOFOL;  Surgeon: Lucilla Lame, MD;  Location: [facility];  Service: Endoscopy;  Laterality: N/A;   FASCIOTOMY Right 05/30/2019   Procedure: FASCIOTOMY;  Surgeon: [name], MD;  Location: [facility];  Service: Vascular;  Laterality: Right;   FASCIOTOMY CLOSURE Right 06/04/2019   Procedure: FASCIOTOMY CLOSURE;  Surgeon: [name], MD;  Location: [facility];  Service: Vascular;  Laterality: Right;   HEMORROIDECTOMY  2014   LOWER EXTREMITY ANGIOGRAPHY Right 05/30/2019   Procedure: LOWER EXTREMITY ANGIOGRAPHY;  Surgeon: [name], MD;  Location: [place] [facility];  Service: Cardiovascular;  Laterality: Right;   LOWER EXTREMITY ANGIOGRAPHY Right 07/02/2020   Procedure: LOWER EXTREMITY ANGIOGRAPHY;  Surgeon: [name], MD;  Location: [place] [facility];  Service: Cardiovascular;  Laterality: Right;   PERIPHERAL VASCULAR CATHETERIZATION N/A 02/09/2016   Procedure: Abdominal Aortogram w/Lower Extremity;  Surgeon: [name], MD;  Location: [place] [facility];  Service: Cardiovascular;  Laterality: N/A;   PERIPHERAL VASCULAR CATHETERIZATION Right 02/10/2016   Procedure: Lower Extremity Angiography;  Surgeon: [name], MD;  Location: [place] [facility];  Service: Cardiovascular;  Laterality: Right;   POLYPECTOMY  11/05/2017   Procedure: POLYPECTOMY;  Surgeon: Lucilla Lame, MD;  Location: [place];  Service: Endoscopy;;   SHOULDER ARTHROSCOPY WITH ROTATOR CUFF REPAIR AND  SUBACROMIAL DECOMPRESSION Right 02/27/2020   Procedure: RIGHT SHOULDER ARTHROSCOPY SUBACROMIAL DECOMPRESSION, DISTAL CLAVICLE EXCISION AND MINI-OPEN ROTATOR CUFF REPAIR;  Surgeon: Thornton Park, MD;  Location: [facility];  Service: Orthopedics;  Laterality: Right;   TONSILLECTOMY     VASCULAR SURGERY  [id number]   Fem-Pop Bypass    There were no vitals filed for this visit.   Subjective Assessment - 03/30/21 1036     Subjective Pt states she is doing well today.  She notes that the redness has returned to the distal end of her residual right lower extremity.  She did have a fall on Monday morning early.  She woke up to go to the bathroom and was using her wheelchair.  When she stood up from the commode she fell over.  Patient does not recall all the details surrounding the event and reports that she thinks she may have fallen back asleep.  She takes sleeping medication at night which leaves her very drowsy in the middle the night.  She bumped her residual right lower extremity and has a small bruise on the anterior portion of the very end of her limb. She denies any resting pain upon arrival today.    Pertinent History Pt has a history of RLE vascular issues since 1991. 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 She received her RLE prosthetic in April 2022 and has been receiving [place] [facility] since her surgery. They discharged her yesterday to continue with OP PT. She continues with phantom RLE pain, especially at night and takes gabapentin with minimal relief. She has also been having R shoulder pain since the surgery which she attributes to using a wheelchair. She underwent R RTC surgery in September of 2021 and had physical therapy afterwards. She has been having issues with bilateral carpal tunnel and has a NCV study scheduled for tomorrow.    Limitations Walking    Patient Stated Goals Ambulate with her prosthetic               TREATMENT   Ther-ex  With  prosthetic donned: NuStep L1-2 x 5 minutes BLE only during history;  With prosthetic doffed: Supine R SLR hip flexion with manual resistance from therapist 2 x 20; Supine RLE bridge with residual RLE resting on stool (towel on top for cushion), arms at side, 3s hold, 2 x 10; Supine RLE long axis manually resisted external and internal rotation 2 x 20 each; Supine RLE adduction with manual resistance 2 x 20; L sidelying R hip abduction with manual resistance 2 x 20; L sidelying R hip extension from 90 hip flexion to available end range extension with manual resistance 2 x 20;   Gait Training Patient brought her new Lofstrand crutches and therapist adjusted height and forearm support properly. Practiced gait with bilateral Lofstrand crutches in rehab gym and outside. Reviewed proper crutch sequencing. Verbal cues to increase LLE stride to encourage RLE toe off and improved prosthetic knee flexion during swing phase. She is able to ambulate during  entire session without any LOB or stumbles. Pt encouraged to start using bilateral Lofstrand crutches at home with increasing frequency and distance with eventual progression to community mobility.   Pt educated throughout session about proper posture and technique with exercises. Improved exercise technique, movement at target joints, use of target muscles after min to mod verbal, visual, tactile cues.      Patient demonstrates excellent motivation during session today. Repeated mat table strengthening for R hip strength/stability.  She is demonstrating improved right lower extremity strength. Patient brought her new Lofstrand crutches and therapist adjusted height and forearm support properly. Practiced gait with bilateral Lofstrand crutches in rehab gym and outside. Reviewed proper crutch sequencing. Verbal cues to increase LLE stride to encourage RLE toe off and improved prosthetic knee flexion during swing phase. She is able to ambulate during entire  session without any LOB or stumbles. Pt encouraged to start using bilateral Lofstrand crutches at home with increasing frequency and distance with eventual progression to community mobility. Patient encouraged to follow-up as scheduled. Pt will benefit from PT services to address deficits in strength, balance, and mobility in order to return to full function at home.                            PT Short Term Goals - 03/02/21 0959       PT SHORT TERM GOAL #1   Title Pt will be independent with HEP in order to improve strength and balance in order to decrease fall risk and improve function at home.    Time 4    Period Weeks    Status On-going    Target Date 02/23/21               PT Long Term Goals - 03/02/21 1000       PT LONG TERM GOAL #1   Title Pt will improve BERG by at least 3 points in order to demonstrate clinically significant improvement in balance.    Baseline 01/26/21: 45/56; 03/02/21: 47/56    Time 8    Period Weeks    Status Partially Met    Target Date 03/23/21      PT LONG TERM GOAL #2   Title Pt will improve ABC by at least 13% in order to demonstrate clinically significant improvement in balance confidence.    Baseline 01/26/21: 57.5%; 03/02/21: 89.4%    Time 8    Period Weeks    Status Achieved      PT LONG TERM GOAL #3   Title Pt will improve FOTO to at least 63 in order to demonstrate clinically significant improvement in function related to her RLE amputation    Baseline 01/26/21: 59; 03/02/21: 71    Time 8    Period Weeks    Status Achieved    Target Date --      PT LONG TERM GOAL #4   Title Pt will increase self-selected 10MWT by at least 0.13 m/s in order to demonstrate clinically significant improvement in community ambulation.    Baseline 01/26/21: 31.5s = 0.32 m/s; 03/02/21: 25.5s = 0.39 m/s;    Time 8    Period Weeks    Status Partially Met    Target Date 03/23/21                   Plan - 03/30/21 1126     Clinical  Impression Statement Patient demonstrates excellent motivation during session  today. Repeated mat table strengthening for R hip strength/stability.  She is demonstrating improved right lower extremity strength. Patient brought her new Lofstrand crutches and therapist adjusted height and forearm support properly. Practiced gait with bilateral Lofstrand crutches in rehab gym and outside. Reviewed proper crutch sequencing. Verbal cues to increase LLE stride to encourage RLE toe off and improved prosthetic knee flexion during swing phase. She is able to ambulate during entire session without any LOB or stumbles. Pt encouraged to start using bilateral Lofstrand crutches at home with increasing frequency and distance with eventual progression to community mobility. Patient encouraged to follow-up as scheduled. Pt will benefit from PT services to address deficits in strength, balance, and mobility in order to return to full function at home.    Personal Factors and Comorbidities Age;Comorbidity 3+    Comorbidities Depression, osteoporosis, vascular disease    Examination-Activity Limitations Bathing;Locomotion Level;Squat;Stairs;Stand;Transfers    Examination-Participation Restrictions Cleaning;Community Activity;Meal Prep;Yard Work    Stability/Clinical Decision Making Unstable/Unpredictable    Rehab Potential Good    PT Frequency 2x / week    PT Duration 12 weeks    PT Treatment/Interventions ADLs/Self Care Home Management;Aquatic Therapy;Canalith Repostioning;Cryotherapy;Electrical Stimulation;Iontophoresis 55m/ml Dexamethasone;Moist Heat;Traction;Ultrasound;Gait training;Stair training;Therapeutic activities;Therapeutic exercise;Neuromuscular re-education;Manual techniques;Dry needling;Vestibular;Spinal Manipulations;Joint Manipulations    PT Next Visit Plan strengthening, practice gait training with lofstrand crutches    PT Home Exercise Plan Access Code: [email or web address]    Consulted and Agree with Plan of Care  Patient               Patient will benefit from skilled therapeutic intervention in order to improve the following deficits and impairments:  Abnormal gait, Decreased strength, Difficulty walking  Visit Diagnosis: Muscle weakness (generalized)  Unsteadiness on feet     Problem List Patient Active Problem List   Diagnosis Date Noted   Muscle spasm 09/09/2020   S/P AKA (above knee amputation), right ([place] 09/08/2020   Ischemia of extremity 07/02/2020   Chronic pain in right shoulder 11/25/2019   Encounter for long-term (current) use of high-risk medication 07/16/2019   GCA (giant cell arteritis) ([place] 07/16/2019   Temporal arteritis ([place] 07/13/2019   Postoperative wound infection 06/23/2019   Ischemic leg 05/31/2019   Atherosclerosis of native arteries of extremity with intermittent claudication ([place] 05/30/2019   Depression 05/29/2019   Eczema of lower extremity 03/04/2018   Chronic venous insufficiency 03/02/2018   Personal history of colonic polyps    Family history of colonic polyps    Benign neoplasm of ascending colon    Mitral valve insufficiency 02/08/2017   Dyspnea on exertion 02/07/2017   Non-rheumatic mitral regurgitation 02/07/2017   Precordial pain 02/07/2017   CAD (coronary artery disease) 12/21/2016   Essential hypertension 12/21/2016   Compression fracture of lumbar spine, non-traumatic ([place] 04/26/2016   Compression fracture of L3 lumbar vertebra 04/20/2016   PVD (peripheral vascular disease) ([facility] 02/24/2016   Family history of premature CAD 02/24/2016   Gastritis    Other specified diseases of esophagus    Hiatal hernia    Gastritis and gastroduodenitis    Ischemia of lower extremity    Arterial occlusion (HCC)    Atherosclerosis of aorta ([place]    History of smoking    Hyperlipidemia    Pain in the chest    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 02/05/2016   Carotid artery stenosis 01/04/2016   Vertigo, benign  paroxysmal 12/28/2015   Clinical depression 09/06/2015   History of alcoholism ([place] 09/06/2015  Angiopathy, 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[place]) 09/06/2015   Atherosclerosis of native arteries of extremity with rest pain ([place]) 09/06/2015   Acute non-recurrent maxillary sinusitis 07/14/2015   Vaginal pruritus 05/26/2015   Chronic recurrent major depressive disorder ([place]) 03/09/2015   Osteoporosis, post-menopausal 03/09/2015   Peripheral blood vessel disorder ([place]) 03/09/2015   Acid reflux 03/09/2015   GERD (gastroesophageal reflux disease) 12/21/2014   Carotid artery narrowing 01/28/2014   Peripheral arterial occlusive disease ([place]) 06/08/2011   Occlusion and stenosis of unspecified carotid artery 06/08/2011   PAD (peripheral artery disease) ([place]) 06/08/2011   Lyndel Safe Zanobia Griebel PT, DPT, GCS  Solymar Grace 03/31/2021, 9:01 AM  [facility] [address]. [place], [place], [address] Phone: [phone]   Fax:  [phone]  Name: Sara [name] MRN: [id number] Date of Birth: 1950-11-28

## 2021-04-01 ENCOUNTER — Other Ambulatory Visit: Payer: Self-pay

## 2021-04-01 ENCOUNTER — Ambulatory Visit: Payer: Medicare Other

## 2021-04-01 DIAGNOSIS — M6281 Muscle weakness (generalized): Secondary | ICD-10-CM | POA: Diagnosis not present

## 2021-04-01 DIAGNOSIS — R262 Difficulty in walking, not elsewhere classified: Secondary | ICD-10-CM

## 2021-04-01 DIAGNOSIS — R2681 Unsteadiness on feet: Secondary | ICD-10-CM

## 2021-04-01 NOTE — Therapy (Signed)
[facility] [address] [place], [place], [address] Phone: [phone]   Fax:  [phone]  Physical Therapy Treatment  Patient Details  Name: Sara [name] MRN: [id number] Date of Birth: [birth year] Referring Provider (PT): Dr. Delana Meyer   Encounter Date: 04/01/2021   PT End of Session - 04/01/21 1433     Visit Number 19    Number of Visits 25    Date for PT Re-Evaluation 04/20/21    Authorization Type eval: 01/26/21    PT Start Time 1015    PT Stop Time 1055    PT Time Calculation (min) 40 min    Equipment Utilized During Treatment Gait belt    Activity Tolerance Patient tolerated treatment well    Behavior During Therapy [facility] for tasks assessed/performed               Past Medical History:  Diagnosis Date   Arthritis    right shoulder   Depression    Dyspnea    GERD (gastroesophageal reflux disease)    History of blood clots    Hyperlipidemia    Hypertension    Mild mitral regurgitation    Osteoporosis    Peripheral vascular disease ([place])    Stomach ulcer    Vascular disease    Sees Dr. Delana Meyer   Vertigo    Last episode approx Aug 2015   Wears dentures    full upper    Past Surgical History:  Procedure Laterality Date   ADENOIDECTOMY     AMPUTATION Right 08/11/2020   Procedure: AMPUTATION ABOVE KNEE;  Surgeon: [name], MD;  Location: [facility];  Service: Vascular;  Laterality: Right;   ARTERY BIOPSY Right 07/14/2019   Procedure: BIOPSY TEMPORAL ARTERY;  Surgeon: [name], MD;  Location: [facility];  Service: Vascular;  Laterality: Right;   BREAST CYST ASPIRATION Left    CARDIAC CATHETERIZATION  02/15/2017   [facility]   COLONOSCOPY WITH PROPOFOL N/A 10/22/2017   Procedure: COLONOSCOPY WITH PROPOFOL;  Surgeon: Lucilla Lame, MD;  Location: [place];  Service: Endoscopy;  Laterality: N/A;  specimens not taken--pt on Plavix will be brought back in after 7 days off med   COLONOSCOPY WITH  PROPOFOL N/A 11/05/2017   Procedure: COLONOSCOPY WITH PROPOFOL;  Surgeon: Lucilla Lame, MD;  Location: [place];  Service: Endoscopy;  Laterality: N/A;   ESOPHAGOGASTRODUODENOSCOPY N/A 12/21/2014   Procedure: ESOPHAGOGASTRODUODENOSCOPY (EGD);  Surgeon: Lucilla Lame, MD;  Location: [place];  Service: Gastroenterology;  Laterality: N/A;   ESOPHAGOGASTRODUODENOSCOPY (EGD) WITH PROPOFOL N/A 02/08/2016   Procedure: ESOPHAGOGASTRODUODENOSCOPY (EGD) WITH PROPOFOL;  Surgeon: Lucilla Lame, MD;  Location: [facility];  Service: Endoscopy;  Laterality: N/A;   FASCIOTOMY Right 05/30/2019   Procedure: FASCIOTOMY;  Surgeon: [name], MD;  Location: [facility];  Service: Vascular;  Laterality: Right;   FASCIOTOMY CLOSURE Right 06/04/2019   Procedure: FASCIOTOMY CLOSURE;  Surgeon: [name], MD;  Location: [facility];  Service: Vascular;  Laterality: Right;   HEMORROIDECTOMY  2014   LOWER EXTREMITY ANGIOGRAPHY Right 05/30/2019   Procedure: LOWER EXTREMITY ANGIOGRAPHY;  Surgeon: [name], MD;  Location: [place] [facility];  Service: Cardiovascular;  Laterality: Right;   LOWER EXTREMITY ANGIOGRAPHY Right 07/02/2020   Procedure: LOWER EXTREMITY ANGIOGRAPHY;  Surgeon: [name], MD;  Location: [place] [facility];  Service: Cardiovascular;  Laterality: Right;   PERIPHERAL VASCULAR CATHETERIZATION N/A 02/09/2016   Procedure: Abdominal Aortogram w/Lower Extremity;  Surgeon: [name], MD;  Location: [place] [facility];  Service: Cardiovascular;  Laterality: N/A;   PERIPHERAL VASCULAR CATHETERIZATION Right 02/10/2016   Procedure: Lower Extremity Angiography;  Surgeon: [name], MD;  Location: [place] [facility];  Service: Cardiovascular;  Laterality: Right;   POLYPECTOMY  11/05/2017   Procedure: POLYPECTOMY;  Surgeon: Lucilla Lame, MD;  Location: [place];  Service: Endoscopy;;   SHOULDER ARTHROSCOPY WITH ROTATOR CUFF REPAIR AND  SUBACROMIAL DECOMPRESSION Right 02/27/2020   Procedure: RIGHT SHOULDER ARTHROSCOPY SUBACROMIAL DECOMPRESSION, DISTAL CLAVICLE EXCISION AND MINI-OPEN ROTATOR CUFF REPAIR;  Surgeon: Thornton Park, MD;  Location: [facility];  Service: Orthopedics;  Laterality: Right;   TONSILLECTOMY     VASCULAR SURGERY  [id number]   Fem-Pop Bypass    There were no vitals filed for this visit.   Subjective Assessment - 04/01/21 1433     Subjective Pt states she is doing well today. She denies any resting pain upon arrival today.  She has been ambulating with her Loftstrand crutches.  No specific questions or concerns currently.    Pertinent History Pt has a history of RLE vascular issues since 1991. 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 She received her RLE prosthetic in April 2022 and has been receiving [facility] since her surgery. They discharged her yesterday to continue with OP PT. She continues with phantom RLE pain, especially at night and takes gabapentin with minimal relief. She has also been having R shoulder pain since the surgery which she attributes to using a wheelchair. She underwent R RTC surgery in September of 2021 and had physical therapy afterwards. She has been having issues with bilateral carpal tunnel and has a NCV study scheduled for tomorrow.    Limitations Walking    Patient Stated Goals Ambulate with her prosthetic               TREATMENT   Ther-ex  With prosthetic donned: NuStep L1-2 x 5 minutes BLE only during history;  With prosthetic donned Supine R SLR hip flexion x 10; Supine BLE bridge arms at side, 3s hold, x 10; Hooklying clams and adductor squeezes with manual resistance from therapist x10 each; L sidelying R hip abduction with manual resistance 2 x 10; L sidelying R hip extension from 90 hip flexion to available end range extension with manual resistance 2 x 10; Right side-lying left hip bridges with prosthetic supported on  half foam roller x10;   Gait Training Practiced gait with single Lofstrand crutch in LUE in rehab gym and outside. Reviewed proper crutch sequencing. Verbal cues to increase LLE stride to encourage RLE toe off and improved prosthetic knee flexion during swing phase.  Extensive practice in building as well as outside over grass, mulch, inclines, declines, and curbs.  She has one loss of balance requiring moderate assistance from therapist to prevent her from falling.  Pt educated throughout session about proper posture and technique with exercises. Improved exercise technique, movement at target joints, use of target muscles after min to mod verbal, visual, tactile cues.      Patient demonstrates excellent motivation during session today. Repeated mat table strengthening for R hip strength/stability but right lower extremity prosthetic donned during all exercise. Practiced gait with single Lofstrand crutch in LUE in rehab gym and outside. Reviewed proper crutch sequencing. Verbal cues to increase LLE stride to encourage RLE toe off and improved prosthetic knee flexion during swing phase.  Extensive practice in building  as well as outside over grass, mulch, inclines, declines, and curbs.  She has one loss of balance requiring moderate assistance from therapist to prevent her from falling.  Patient still needs to use bilateral Loftstrand's for safety with ambulation. Patient encouraged to follow-up as scheduled. Pt will benefit from PT services to address deficits in strength, balance, and mobility in order to return to full function at home.                            PT Short Term Goals - 03/02/21 0959       PT SHORT TERM GOAL #1   Title Pt will be independent with HEP in order to improve strength and balance in order to decrease fall risk and improve function at home.    Time 4    Period Weeks    Status On-going    Target Date 02/23/21               PT Long Term  Goals - 03/02/21 1000       PT LONG TERM GOAL #1   Title Pt will improve BERG by at least 3 points in order to demonstrate clinically significant improvement in balance.    Baseline 01/26/21: 45/56; 03/02/21: 47/56    Time 8    Period Weeks    Status Partially Met    Target Date 03/23/21      PT LONG TERM GOAL #2   Title Pt will improve ABC by at least 13% in order to demonstrate clinically significant improvement in balance confidence.    Baseline 01/26/21: 57.5%; 03/02/21: 89.4%    Time 8    Period Weeks    Status Achieved      PT LONG TERM GOAL #3   Title Pt will improve FOTO to at least 63 in order to demonstrate clinically significant improvement in function related to her RLE amputation    Baseline 01/26/21: 59; 03/02/21: 71    Time 8    Period Weeks    Status Achieved    Target Date --      PT LONG TERM GOAL #4   Title Pt will increase self-selected 10MWT by at least 0.13 m/s in order to demonstrate clinically significant improvement in community ambulation.    Baseline 01/26/21: 31.5s = 0.32 m/s; 03/02/21: 25.5s = 0.39 m/s;    Time 8    Period Weeks    Status Partially Met    Target Date 03/23/21                   Plan - 04/01/21 1434     Clinical Impression Statement Patient demonstrates excellent motivation during session today. Repeated mat table strengthening for R hip strength/stability but right lower extremity prosthetic donned during all exercise. Practiced gait with single Lofstrand crutch in LUE in rehab gym and outside. Reviewed proper crutch sequencing. Verbal cues to increase LLE stride to encourage RLE toe off and improved prosthetic knee flexion during swing phase.  Extensive practice in building as well as outside over grass, mulch, inclines, declines, and curbs.  She has one loss of balance requiring moderate assistance from therapist to prevent her from falling.  Patient still needs to use bilateral Loftstrand's for safety with ambulation. Patient  encouraged to follow-up as scheduled. Pt will benefit from PT services to address deficits in strength, balance, and mobility in order to return to full function at home.  Personal Factors and Comorbidities Age;Comorbidity 3+    Comorbidities Depression, osteoporosis, vascular disease    Examination-Activity Limitations Bathing;Locomotion Level;Squat;Stairs;Stand;Transfers    Examination-Participation Restrictions Cleaning;Community Activity;Meal Prep;Yard Work    Stability/Clinical Decision Making Unstable/Unpredictable    Rehab Potential Good    PT Frequency 2x / week    PT Duration 12 weeks    PT Treatment/Interventions ADLs/Self Care Home Management;Aquatic Therapy;Canalith Repostioning;Cryotherapy;Electrical Stimulation;Iontophoresis 92m/ml Dexamethasone;Moist Heat;Traction;Ultrasound;Gait training;Stair training;Therapeutic activities;Therapeutic exercise;Neuromuscular re-education;Manual techniques;Dry needling;Vestibular;Spinal Manipulations;Joint Manipulations    PT Next Visit Plan strengthening, practice gait training with lofstrand crutches    PT Home Exercise Plan Access Code: [email or web address]    Consulted and Agree with Plan of Care Patient               Patient will benefit from skilled therapeutic intervention in order to improve the following deficits and impairments:  Abnormal gait, Decreased strength, Difficulty walking  Visit Diagnosis: Muscle weakness (generalized)  Difficulty in walking, not elsewhere classified  Unsteadiness on feet     Problem List Patient Active Problem List   Diagnosis Date Noted   Muscle spasm 09/09/2020   S/P AKA (above knee amputation), right ([place] 09/08/2020   Ischemia of extremity 07/02/2020   Chronic pain in right shoulder 11/25/2019   Encounter for long-term (current) use of high-risk medication 07/16/2019   GCA (giant cell arteritis) ([place] 07/16/2019   Temporal arteritis ([facility] 07/13/2019   Postoperative wound infection 06/23/2019    Ischemic leg 05/31/2019   Atherosclerosis of native arteries of extremity with intermittent claudication ([place] 05/30/2019   Depression 05/29/2019   Eczema of lower extremity 03/04/2018   Chronic venous insufficiency 03/02/2018   Personal history of colonic polyps    Family history of colonic polyps    Benign neoplasm of ascending colon    Mitral valve insufficiency 02/08/2017   Dyspnea on exertion 02/07/2017   Non-rheumatic mitral regurgitation 02/07/2017   Precordial pain 02/07/2017   CAD (coronary artery disease) 12/21/2016   Essential hypertension 12/21/2016   Compression fracture of lumbar spine, non-traumatic ([place] 04/26/2016   Compression fracture of L3 lumbar vertebra 04/20/2016   PVD (peripheral vascular disease) ([facility] 02/24/2016   Family history of premature CAD 02/24/2016   Gastritis    Other specified diseases of esophagus    Hiatal hernia    Gastritis and gastroduodenitis    Ischemia of lower extremity    Arterial occlusion (HCC)    Atherosclerosis of aorta (HCC)    History of smoking    Hyperlipidemia    Pain in the chest    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 02/05/2016   Carotid artery stenosis 01/04/2016   Vertigo, benign paroxysmal 12/28/2015   Clinical depression 09/06/2015   History of alcoholism ([facility] 09/06/2015   Angiopathy, 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[facility] 09/06/2015   Atherosclerosis of native arteries of extremity with rest pain ([facility] [place] 09/06/2015   Acute non-recurrent maxillary sinusitis 07/14/2015   Vaginal pruritus 05/26/2015   Chronic recurrent major depressive disorder ([place] 03/09/2015   Osteoporosis, post-menopausal 03/09/2015   Peripheral blood vessel disorder ([place] 03/09/2015   Acid reflux 03/09/2015   GERD (gastroesophageal reflux disease) 12/21/2014   Carotid artery narrowing 01/28/2014   Peripheral arterial occlusive disease ([place] 06/08/2011   Occlusion and stenosis of unspecified carotid artery 06/08/2011   PAD (peripheral  artery disease) ([facility] 06/08/2011   JLyndel SafeHuprich PT, DPT, GCS  Lorinda Copland 04/01/2021, 2:38 PM  [facility] [place] [address]Phone:  [phone]   Fax:  [phone]  Name: PETRINA MELBY MRN: [id number] Date of Birth: 10-Feb-1951

## 2021-04-06 ENCOUNTER — Other Ambulatory Visit: Payer: Self-pay

## 2021-04-06 ENCOUNTER — Ambulatory Visit: Payer: Medicare Other

## 2021-04-06 DIAGNOSIS — R262 Difficulty in walking, not elsewhere classified: Secondary | ICD-10-CM

## 2021-04-06 DIAGNOSIS — M6281 Muscle weakness (generalized): Secondary | ICD-10-CM

## 2021-04-06 NOTE — Therapy (Signed)
[facility] [address] [place], [place], [address] Phone: [phone]   Fax:  [phone]  Physical Therapy Progress Note  Dates of reporting period  03/02/21   to   04/06/21  Patient Details  Name: Sara [name] MRN: [id number] Date of Birth: Nov 19, 1950 Referring Provider (PT): Dr. Delana Meyer   Encounter Date: 04/06/2021   PT End of Session - 04/06/21 1046     Visit Number 20    Number of Visits 25    Date for PT Re-Evaluation 04/20/21    Authorization Type eval: 01/26/21    PT Start Time 1018    PT Stop Time 1100    PT Time Calculation (min) 42 min    Equipment Utilized During Treatment Gait belt    Activity Tolerance Patient tolerated treatment well    Behavior During Therapy [facility] for tasks assessed/performed               Past Medical History:  Diagnosis Date   Arthritis    right shoulder   Depression    Dyspnea    GERD (gastroesophageal reflux disease)    History of blood clots    Hyperlipidemia    Hypertension    Mild mitral regurgitation    Osteoporosis    Peripheral vascular disease ([place])    Stomach ulcer    Vascular disease    Sees Dr. Delana Meyer   Vertigo    Last episode approx Aug 2015   Wears dentures    full upper    Past Surgical History:  Procedure Laterality Date   ADENOIDECTOMY     AMPUTATION Right 08/11/2020   Procedure: AMPUTATION ABOVE KNEE;  Surgeon: [name], MD;  Location: [facility];  Service: Vascular;  Laterality: Right;   ARTERY BIOPSY Right 07/14/2019   Procedure: BIOPSY TEMPORAL ARTERY;  Surgeon: [name], MD;  Location: [facility];  Service: Vascular;  Laterality: Right;   BREAST CYST ASPIRATION Left    CARDIAC CATHETERIZATION  02/15/2017   [facility]   COLONOSCOPY WITH PROPOFOL N/A 10/22/2017   Procedure: COLONOSCOPY WITH PROPOFOL;  Surgeon: Lucilla Lame, MD;  Location: [place];  Service: Endoscopy;  Laterality: N/A;  specimens not taken--pt on Plavix will be  brought back in after 7 days off med   COLONOSCOPY WITH PROPOFOL N/A 11/05/2017   Procedure: COLONOSCOPY WITH PROPOFOL;  Surgeon: Lucilla Lame, MD;  Location: [place];  Service: Endoscopy;  Laterality: N/A;   ESOPHAGOGASTRODUODENOSCOPY N/A 12/21/2014   Procedure: ESOPHAGOGASTRODUODENOSCOPY (EGD);  Surgeon: Lucilla Lame, MD;  Location: [place];  Service: Gastroenterology;  Laterality: N/A;   ESOPHAGOGASTRODUODENOSCOPY (EGD) WITH PROPOFOL N/A 02/08/2016   Procedure: ESOPHAGOGASTRODUODENOSCOPY (EGD) WITH PROPOFOL;  Surgeon: Lucilla Lame, MD;  Location: [facility];  Service: Endoscopy;  Laterality: N/A;   FASCIOTOMY Right 05/30/2019   Procedure: FASCIOTOMY;  Surgeon: [name], MD;  Location: [facility];  Service: Vascular;  Laterality: Right;   FASCIOTOMY CLOSURE Right 06/04/2019   Procedure: FASCIOTOMY CLOSURE;  Surgeon: [name], MD;  Location: [facility];  Service: Vascular;  Laterality: Right;   HEMORROIDECTOMY  2014   LOWER EXTREMITY ANGIOGRAPHY Right 05/30/2019   Procedure: LOWER EXTREMITY ANGIOGRAPHY;  Surgeon: [name], MD;  Location: [place] [facility];  Service: Cardiovascular;  Laterality: Right;   LOWER EXTREMITY ANGIOGRAPHY Right 07/02/2020   Procedure: LOWER EXTREMITY ANGIOGRAPHY;  Surgeon: [name], MD;  Location: [place] [facility];  Service: Cardiovascular;  Laterality: Right;  PERIPHERAL VASCULAR CATHETERIZATION N/A 02/09/2016   Procedure: Abdominal Aortogram w/Lower Extremity;  Surgeon: [name], MD;  Location: [place] [facility];  Service: Cardiovascular;  Laterality: N/A;   PERIPHERAL VASCULAR CATHETERIZATION Right 02/10/2016   Procedure: Lower Extremity Angiography;  Surgeon: [name], MD;  Location: [place] [facility];  Service: Cardiovascular;  Laterality: Right;   POLYPECTOMY  11/05/2017   Procedure: POLYPECTOMY;  Surgeon: Lucilla Lame, MD;  Location: [place];  Service: Endoscopy;;    SHOULDER ARTHROSCOPY WITH ROTATOR CUFF REPAIR AND SUBACROMIAL DECOMPRESSION Right 02/27/2020   Procedure: RIGHT SHOULDER ARTHROSCOPY SUBACROMIAL DECOMPRESSION, DISTAL CLAVICLE EXCISION AND MINI-OPEN ROTATOR CUFF REPAIR;  Surgeon: Thornton Park, MD;  Location: [facility];  Service: Orthopedics;  Laterality: Right;   TONSILLECTOMY     VASCULAR SURGERY  [id number]   Fem-Pop Bypass    There were no vitals filed for this visit.   Subjective Assessment - 04/06/21 1040     Subjective Pt states she is doing well today. She denies any resting pain upon arrival today.  Shoulder is doing alright today. The bruise on her residual RLE is improving. No redness in residual limb. She saw the prosthetist who did not make any modifications and said he doesn't need to see her back until she finishes with physical therapy. She has been ambulating with her [place] crutches at home and has tried walking in her kitchen without crutches but she is too unstable.  No specific questions or concerns currently.    Pertinent History Pt has a history of RLE vascular issues since 1991. 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 She received her RLE prosthetic in April 2022 and has been receiving [place] [facility] since her surgery. They discharged her yesterday to continue with OP PT. She continues with phantom RLE pain, especially at night and takes gabapentin with minimal relief. She has also been having R shoulder pain since the surgery which she attributes to using a wheelchair. She underwent R RTC surgery in September of 2021 and had physical therapy afterwards. She has been having issues with bilateral carpal tunnel and has a NCV study scheduled for tomorrow.    Limitations Walking    Patient Stated Goals Ambulate with her prosthetic               TREATMENT     Neuromuscular Re-education  Updated outcome measures and goals with patient: FOTO: 71 BERG: 49/56 Self-selected 81mgait  speed: 20.7s = 0.48 m/s   Forward and side stepping in // bars without UE support and CGA from therapist x 4 lengths each direction; R single leg balance practice x 30s;   Ther-ex  With prosthetic doffed: Supine R SLR hip flexion with manual resistance from therapist x 20; Supine RLE bridge with residual RLE resting on stool (towel on top for cushion), arms at side, 3s hold x 10; Supine RLE long axis manually resisted external and internal rotation x 20 each; L sidelying R hip abduction with manual resistance x 20; L sidelying R hip extension from 90 hip flexion to available end range extension with manual resistance x 20;    Pt educated throughout session about proper posture and technique with exercises. Improved exercise technique, movement at target joints, use of target muscles after min to mod verbal, visual, tactile cues.      Patient demonstrates excellent motivation during session today.  Updated outcome measures and goals with patient today.  Her FOTO score remained  at 49 today. Her BERG increased by 2 points to 49/56.  Her 42mgait speed increased to 0.48 m/s. Overall patient demonstrates significant improvement and has progressed to ambulation with bilateral L[place]strand crutches. Continued with additional balance training working on right single-leg stability in parallel bars. No HEP progression today. Patient's condition has the potential to improve in response to therapy. Maximum improvement is yet to be obtained. The anticipated improvement is attainable and reasonable in a generally predictable time. Patient encouraged to follow-up as scheduled. Pt will benefit from PT services to address deficits in strength, balance, and mobility in order to return to full function at home.                                   PT Short Term Goals - 03/02/21 0959       PT SHORT TERM GOAL #1   Title Pt will be independent with HEP in order to improve strength and balance  in order to decrease fall risk and improve function at home.    Time 4    Period Weeks    Status On-going    Target Date 02/23/21               PT Long Term Goals - 03/02/21 1000       PT LONG TERM GOAL #1   Title Pt will improve BERG by at least 3 points in order to demonstrate clinically significant improvement in balance.    Baseline 01/26/21: 45/56; 03/02/21: 47/56    Time 8    Period Weeks    Status Partially Met    Target Date 03/23/21      PT LONG TERM GOAL #2   Title Pt will improve ABC by at least 13% in order to demonstrate clinically significant improvement in balance confidence.    Baseline 01/26/21: 57.5%; 03/02/21: 89.4%    Time 8    Period Weeks    Status Achieved      PT LONG TERM GOAL #3   Title Pt will improve FOTO to at least 63 in order to demonstrate clinically significant improvement in function related to her RLE amputation    Baseline 01/26/21: 59; 03/02/21: 71    Time 8    Period Weeks    Status Achieved    Target Date --      PT LONG TERM GOAL #4   Title Pt will increase self-selected 10MWT by at least 0.13 m/s in order to demonstrate clinically significant improvement in community ambulation.    Baseline 01/26/21: 31.5s = 0.32 m/s; 03/02/21: 25.5s = 0.39 m/s;    Time 8    Period Weeks    Status Partially Met    Target Date 03/23/21                   Plan - 04/06/21 1046     Clinical Impression Statement Patient demonstrates excellent motivation during session today.  Updated outcome measures and goals with patient today.  Her FOTO score remained at 71 today. Her BERG increased by 2 points to 49/56.  Her 127mait speed increased to 0.48 m/s. Overall patient demonstrates significant improvement and has progressed to ambulation with bilateral Lofstrand crutches. Continued with additional balance training working on right single-leg stability in parallel bars. No HEP progression today. Patient's condition has the potential to improve in response  to therapy. Maximum improvement is yet to be  obtained. The anticipated improvement is attainable and reasonable in a generally predictable time. Patient encouraged to follow-up as scheduled. Pt will benefit from PT services to address deficits in strength, balance, and mobility in order to return to full function at home.    Personal Factors and Comorbidities Age;Comorbidity 3+    Comorbidities Depression, osteoporosis, vascular disease    Examination-Activity Limitations Bathing;Locomotion Level;Squat;Stairs;Stand;Transfers    Examination-Participation Restrictions Cleaning;Community Activity;Meal Prep;Yard Work    Stability/Clinical Decision Making Unstable/Unpredictable    Rehab Potential Good    PT Frequency 2x / week    PT Duration 12 weeks    PT Treatment/Interventions ADLs/Self Care Home Management;Aquatic Therapy;Canalith Repostioning;Cryotherapy;Electrical Stimulation;Iontophoresis 35m/ml Dexamethasone;Moist Heat;Traction;Ultrasound;Gait training;Stair training;Therapeutic activities;Therapeutic exercise;Neuromuscular re-education;Manual techniques;Dry needling;Vestibular;Spinal Manipulations;Joint Manipulations    PT Next Visit Plan strengthening, practice gait training with lofstrand crutches    PT Home Exercise Plan Access Code: [email or web address]    Consulted and Agree with Plan of Care Patient               Patient will benefit from skilled therapeutic intervention in order to improve the following deficits and impairments:  Abnormal gait, Decreased strength, Difficulty walking  Visit Diagnosis: Muscle weakness (generalized)  Difficulty in walking, not elsewhere classified     Problem List Patient Active Problem List   Diagnosis Date Noted   Muscle spasm 09/09/2020   S/P AKA (above knee amputation), right ([facility] 09/08/2020   Ischemia of extremity 07/02/2020   Chronic pain in right shoulder 11/25/2019   Encounter for long-term (current) use of high-risk medication 07/16/2019    GCA (giant cell arteritis) ([facility] 07/16/2019   Temporal arteritis ([facility] 07/13/2019   Postoperative wound infection 06/23/2019   Ischemic leg 05/31/2019   Atherosclerosis of native arteries of extremity with intermittent claudication ([facility] 05/30/2019   Depression 05/29/2019   Eczema of lower extremity 03/04/2018   Chronic venous insufficiency 03/02/2018   Personal history of colonic polyps    Family history of colonic polyps    Benign neoplasm of ascending colon    Mitral valve insufficiency 02/08/2017   Dyspnea on exertion 02/07/2017   Non-rheumatic mitral regurgitation 02/07/2017   Precordial pain 02/07/2017   CAD (coronary artery disease) 12/21/2016   Essential hypertension 12/21/2016   Compression fracture of lumbar spine, non-traumatic ([facility] 04/26/2016   Compression fracture of L3 lumbar vertebra 04/20/2016   PVD (peripheral vascular disease) ([facility] 02/24/2016   Family history of premature CAD 02/24/2016   Gastritis    Other specified diseases of esophagus    Hiatal hernia    Gastritis and gastroduodenitis    Ischemia of lower extremity    Arterial occlusion (HCC)    Atherosclerosis of aorta ([place]    History of smoking    Hyperlipidemia    Pain in the chest    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 02/05/2016   Carotid artery stenosis 01/04/2016   Vertigo, benign paroxysmal 12/28/2015   Clinical depression 09/06/2015   History of alcoholism ([facility] 09/06/2015   Angiopathy, 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[facility] 09/06/2015   Atherosclerosis of native arteries of extremity with rest pain ([facility] 09/06/2015   Acute non-recurrent maxillary sinusitis 07/14/2015   Vaginal pruritus 05/26/2015   Chronic recurrent major depressive disorder ([facility] 03/09/2015   Osteoporosis, post-menopausal 03/09/2015   Peripheral blood vessel disorder ([facility] 03/09/2015   Acid reflux 03/09/2015   GERD (gastroesophageal reflux disease) 12/21/2014   Carotid artery narrowing 01/28/2014   Peripheral  arterial occlusive disease ([facility] 06/08/2011   Occlusion and stenosis of unspecified  carotid artery 06/08/2011   PAD (peripheral artery disease) ([place]) 06/08/2011   Lyndel Safe Huprich PT, DPT, GCS  Huprich,Jason 04/06/2021, 1:32 PM  [place] [facility] [address] [place], [place], [address] Phone: [phone]   Fax:  [phone]  Name: Sara [name] MRN: [id number] Date of Birth: Nov 17, 1950

## 2021-04-08 ENCOUNTER — Ambulatory Visit: Payer: Medicare Other

## 2021-04-08 ENCOUNTER — Other Ambulatory Visit: Payer: Self-pay

## 2021-04-08 DIAGNOSIS — R262 Difficulty in walking, not elsewhere classified: Secondary | ICD-10-CM

## 2021-04-08 DIAGNOSIS — R2681 Unsteadiness on feet: Secondary | ICD-10-CM

## 2021-04-08 DIAGNOSIS — M6281 Muscle weakness (generalized): Secondary | ICD-10-CM | POA: Diagnosis not present

## 2021-04-08 NOTE — Therapy (Signed)
[facility] [address] [place], [place], [address] Phone: [phone]   Fax:  [phone]  Physical Therapy Treatment  Patient Details  Name: Sara [name] MRN: [id number] Date of Birth: 1951-03-31 Referring Provider (PT): Dr. Delana Meyer   Encounter Date: 04/08/2021   PT End of Session - 04/08/21 1325     Visit Number 21    Number of Visits 25    Date for PT Re-Evaluation 04/20/21    Authorization Type eval: 01/26/21    PT Start Time 1015    PT Stop Time 1100    PT Time Calculation (min) 45 min    Equipment Utilized During Treatment Gait belt    Activity Tolerance Patient tolerated treatment well    Behavior During Therapy [facility] for tasks assessed/performed               Past Medical History:  Diagnosis Date   Arthritis    right shoulder   Depression    Dyspnea    GERD (gastroesophageal reflux disease)    History of blood clots    Hyperlipidemia    Hypertension    Mild mitral regurgitation    Osteoporosis    Peripheral vascular disease ([place])    Stomach ulcer    Vascular disease    Sees Dr. Delana Meyer   Vertigo    Last episode approx Aug 2015   Wears dentures    full upper    Past Surgical History:  Procedure Laterality Date   ADENOIDECTOMY     AMPUTATION Right 08/11/2020   Procedure: AMPUTATION ABOVE KNEE;  Surgeon: [name], MD;  Location: [facility];  Service: Vascular;  Laterality: Right;   ARTERY BIOPSY Right 07/14/2019   Procedure: BIOPSY TEMPORAL ARTERY;  Surgeon: [name], MD;  Location: [facility];  Service: Vascular;  Laterality: Right;   BREAST CYST ASPIRATION Left    CARDIAC CATHETERIZATION  02/15/2017   [facility]   COLONOSCOPY WITH PROPOFOL N/A 10/22/2017   Procedure: COLONOSCOPY WITH PROPOFOL;  Surgeon: Lucilla Lame, MD;  Location: [place];  Service: Endoscopy;  Laterality: N/A;  specimens not taken--pt on Plavix will be brought back in after 7 days off med   COLONOSCOPY WITH  PROPOFOL N/A 11/05/2017   Procedure: COLONOSCOPY WITH PROPOFOL;  Surgeon: Lucilla Lame, MD;  Location: [place];  Service: Endoscopy;  Laterality: N/A;   ESOPHAGOGASTRODUODENOSCOPY N/A 12/21/2014   Procedure: ESOPHAGOGASTRODUODENOSCOPY (EGD);  Surgeon: Lucilla Lame, MD;  Location: [place];  Service: Gastroenterology;  Laterality: N/A;   ESOPHAGOGASTRODUODENOSCOPY (EGD) WITH PROPOFOL N/A 02/08/2016   Procedure: ESOPHAGOGASTRODUODENOSCOPY (EGD) WITH PROPOFOL;  Surgeon: Lucilla Lame, MD;  Location: [facility];  Service: Endoscopy;  Laterality: N/A;   FASCIOTOMY Right 05/30/2019   Procedure: FASCIOTOMY;  Surgeon: [name], MD;  Location: [facility];  Service: Vascular;  Laterality: Right;   FASCIOTOMY CLOSURE Right 06/04/2019   Procedure: FASCIOTOMY CLOSURE;  Surgeon: [name], MD;  Location: [facility];  Service: Vascular;  Laterality: Right;   HEMORROIDECTOMY  2014   LOWER EXTREMITY ANGIOGRAPHY Right 05/30/2019   Procedure: LOWER EXTREMITY ANGIOGRAPHY;  Surgeon: [name], MD;  Location: [place] [facility];  Service: Cardiovascular;  Laterality: Right;   LOWER EXTREMITY ANGIOGRAPHY Right 07/02/2020   Procedure: LOWER EXTREMITY ANGIOGRAPHY;  Surgeon: [name], MD;  Location: [place] [facility];  Service: Cardiovascular;  Laterality: Right;   PERIPHERAL VASCULAR CATHETERIZATION N/A 02/09/2016   Procedure: Abdominal Aortogram w/Lower Extremity;  Surgeon: [name], MD;  Location: [place] [facility];  Service: Cardiovascular;  Laterality: N/A;   PERIPHERAL VASCULAR CATHETERIZATION Right 02/10/2016   Procedure: Lower Extremity Angiography;  Surgeon: [name], MD;  Location: [place] [facility];  Service: Cardiovascular;  Laterality: Right;   POLYPECTOMY  11/05/2017   Procedure: POLYPECTOMY;  Surgeon: Lucilla Lame, MD;  Location: [place];  Service: Endoscopy;;   SHOULDER ARTHROSCOPY WITH ROTATOR CUFF REPAIR AND  SUBACROMIAL DECOMPRESSION Right 02/27/2020   Procedure: RIGHT SHOULDER ARTHROSCOPY SUBACROMIAL DECOMPRESSION, DISTAL CLAVICLE EXCISION AND MINI-OPEN ROTATOR CUFF REPAIR;  Surgeon: Thornton Park, MD;  Location: [facility];  Service: Orthopedics;  Laterality: Right;   TONSILLECTOMY     VASCULAR SURGERY  4801,6553   Fem-Pop Bypass    There were no vitals filed for this visit.   Subjective Assessment - 04/08/21 1055     Subjective Pt states she is doing well today. She denies any resting pain upon arrival today.  Shoulder is doing alright today. She has been ambulating with her [place] crutches at home. No falls since last therapy session however she did have a near fall while getting her mail. No specific questions currently.    Pertinent History Pt has a history of RLE vascular issues since 1991. 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 She received her RLE prosthetic in April 2022 and has been receiving [place] [facility] since her surgery. They discharged her yesterday to continue with OP PT. She continues with phantom RLE pain, especially at night and takes gabapentin with minimal relief. She has also been having R shoulder pain since the surgery which she attributes to using a wheelchair. She underwent R RTC surgery in September of 2021 and had physical therapy afterwards. She has been having issues with bilateral carpal tunnel and has a NCV study scheduled for tomorrow.    Limitations Walking    Patient Stated Goals Ambulate with her prosthetic               TREATMENT   Ther-ex  With prosthetic donned: NuStep L1-2 x 5 minutes BLE only during history;  With prosthetic doffed: Supine R SLR hip flexion with manual resistance from therapist x 20; Supine RLE bridge with residual RLE resting on stool (towel on top for cushion), arms at side, 3s hold, x 20; Supine RLE long axis manually resisted external and internal rotation x 20 each; Supine RLE adduction  with manual resistance x 20; L sidelying R hip abduction with manual resistance x 20; L sidelying R hip extension from 90 hip flexion to available end range extension with manual resistance x 20;   Neuromuscular Re-education  All exercises performed in parallel bars without upper extremity support unless otherwise indicated;  Forward and side stepping in // bars without UE support and CGA from therapist x 4 lengths each direction; 1/2 foam roll forward stepping with LLE to encourage R single leg balance x 10; Airex wide stance eyes open/closed and 30s each; Airex wide stance eyes open with horizontal and vertical head turns x 30s each; Airex narrow stance eyes open/closed and 30s each; Airex narrow stance eyes open with horizontal and vertical head turns x 30s each;   Pt educated throughout session about proper posture and technique with exercises. Improved exercise technique, movement at target joints, use of target muscles after min to mod verbal, visual, tactile cues.      Patient demonstrates excellent motivation during session today. Repeated mat  table strengthening for R hip strength/stability.  Also continued with balance training in the parallel bars to improve right single-leg stability.  Patient is demonstrating improved control of right lower extremity when ambulating parallel bars without upper extremity assistance. Patient encouraged to follow-up as scheduled. Pt will benefit from PT services to address deficits in strength, balance, and mobility in order to return to full function at home.                            PT Short Term Goals - 03/02/21 0959       PT SHORT TERM GOAL #1   Title Pt will be independent with HEP in order to improve strength and balance in order to decrease fall risk and improve function at home.    Time 4    Period Weeks    Status On-going    Target Date 02/23/21               PT Long Term Goals - 03/02/21 1000        PT LONG TERM GOAL #1   Title Pt will improve BERG by at least 3 points in order to demonstrate clinically significant improvement in balance.    Baseline 01/26/21: 45/56; 03/02/21: 47/56    Time 8    Period Weeks    Status Partially Met    Target Date 03/23/21      PT LONG TERM GOAL #2   Title Pt will improve ABC by at least 13% in order to demonstrate clinically significant improvement in balance confidence.    Baseline 01/26/21: 57.5%; 03/02/21: 89.4%    Time 8    Period Weeks    Status Achieved      PT LONG TERM GOAL #3   Title Pt will improve FOTO to at least 63 in order to demonstrate clinically significant improvement in function related to her RLE amputation    Baseline 01/26/21: 59; 03/02/21: 71    Time 8    Period Weeks    Status Achieved    Target Date --      PT LONG TERM GOAL #4   Title Pt will increase self-selected 10MWT by at least 0.13 m/s in order to demonstrate clinically significant improvement in community ambulation.    Baseline 01/26/21: 31.5s = 0.32 m/s; 03/02/21: 25.5s = 0.39 m/s;    Time 8    Period Weeks    Status Partially Met    Target Date 03/23/21                   Plan - 04/08/21 1326     Clinical Impression Statement Patient demonstrates excellent motivation during session today. Repeated mat table strengthening for R hip strength/stability.  Also continued with balance training in the parallel bars to improve right single-leg stability.  Patient is demonstrating improved control of right lower extremity when ambulating parallel bars without upper extremity assistance. Patient encouraged to follow-up as scheduled. Pt will benefit from PT services to address deficits in strength, balance, and mobility in order to return to full function at home.    Personal Factors and Comorbidities Age;Comorbidity 3+    Comorbidities Depression, osteoporosis, vascular disease    Examination-Activity Limitations Bathing;Locomotion Level;Squat;Stairs;Stand;Transfers     Examination-Participation Restrictions Cleaning;Community Activity;Meal Prep;Yard Work    Stability/Clinical Decision Making Unstable/Unpredictable    Rehab Potential Good    PT Frequency 2x / week    PT Duration 12 weeks  PT Treatment/Interventions ADLs/Self Care Home Management;Aquatic Therapy;Canalith Repostioning;Cryotherapy;Electrical Stimulation;Iontophoresis 51m/ml Dexamethasone;Moist Heat;Traction;Ultrasound;Gait training;Stair training;Therapeutic activities;Therapeutic exercise;Neuromuscular re-education;Manual techniques;Dry needling;Vestibular;Spinal Manipulations;Joint Manipulations    PT Next Visit Plan strengthening, practice gait training with lofstrand crutches    PT Home Exercise Plan Access Code: [email or web address]    Consulted and Agree with Plan of Care Patient               Patient will benefit from skilled therapeutic intervention in order to improve the following deficits and impairments:  Abnormal gait, Decreased strength, Difficulty walking  Visit Diagnosis: Muscle weakness (generalized)  Difficulty in walking, not elsewhere classified  Unsteadiness on feet     Problem List Patient Active Problem List   Diagnosis Date Noted   Muscle spasm 09/09/2020   S/P AKA (above knee amputation), right ([facility] 09/08/2020   Ischemia of extremity 07/02/2020   Chronic pain in right shoulder 11/25/2019   Encounter for long-term (current) use of high-risk medication 07/16/2019   GCA (giant cell arteritis) ([facility] 07/16/2019   Temporal arteritis ([place] 07/13/2019   Postoperative wound infection 06/23/2019   Ischemic leg 05/31/2019   Atherosclerosis of native arteries of extremity with intermittent claudication ([facility] 05/30/2019   Depression 05/29/2019   Eczema of lower extremity 03/04/2018   Chronic venous insufficiency 03/02/2018   Personal history of colonic polyps    Family history of colonic polyps    Benign neoplasm of ascending colon    Mitral valve insufficiency  02/08/2017   Dyspnea on exertion 02/07/2017   Non-rheumatic mitral regurgitation 02/07/2017   Precordial pain 02/07/2017   CAD (coronary artery disease) 12/21/2016   Essential hypertension 12/21/2016   Compression fracture of lumbar spine, non-traumatic ([place] 04/26/2016   Compression fracture of L3 lumbar vertebra 04/20/2016   PVD (peripheral vascular disease) ([facility] 02/24/2016   Family history of premature CAD 02/24/2016   Gastritis    Other specified diseases of esophagus    Hiatal hernia    Gastritis and gastroduodenitis    Ischemia of lower extremity    Arterial occlusion (HCC)    Atherosclerosis of aorta ([facility]    History of smoking    Hyperlipidemia    Pain in the chest    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 02/05/2016   Carotid artery stenosis 01/04/2016   Vertigo, benign paroxysmal 12/28/2015   Clinical depression 09/06/2015   History of alcoholism ([facility] 09/06/2015   Angiopathy, 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[facility] 09/06/2015   Atherosclerosis of native arteries of extremity with rest pain ([facility] 09/06/2015   Acute non-recurrent maxillary sinusitis 07/14/2015   Vaginal pruritus 05/26/2015   Chronic recurrent major depressive disorder ([facility] 03/09/2015   Osteoporosis, post-menopausal 03/09/2015   Peripheral blood vessel disorder ([facility] 03/09/2015   Acid reflux 03/09/2015   GERD (gastroesophageal reflux disease) 12/21/2014   Carotid artery narrowing 01/28/2014   Peripheral arterial occlusive disease ([facility] 06/08/2011   Occlusion and stenosis of unspecified carotid artery 06/08/2011   PAD (peripheral artery disease) ([facility] 06/08/2011   JLyndel SafeHuprich PT, DPT, GCS  Jadore Mcguffin 04/08/2021, 1:35 PM  [facility] [place] [address]Phone: [phone]  Fax:  [phone] Name: HCHRISTINIA LAMBETHMRN: [id number] of Birth: 2December 01, 1952

## 2021-04-13 ENCOUNTER — Ambulatory Visit: Payer: Medicare Other

## 2021-04-13 ENCOUNTER — Other Ambulatory Visit: Payer: Self-pay

## 2021-04-13 DIAGNOSIS — R262 Difficulty in walking, not elsewhere classified: Secondary | ICD-10-CM

## 2021-04-13 DIAGNOSIS — M6281 Muscle weakness (generalized): Secondary | ICD-10-CM | POA: Diagnosis not present

## 2021-04-13 DIAGNOSIS — R2681 Unsteadiness on feet: Secondary | ICD-10-CM

## 2021-04-13 NOTE — Therapy (Signed)
[facility] [address]. [place], [place], [address] Phone: [phone]   Fax:  [phone]  Physical Therapy Treatment  Patient Details  Name: Sara [name] MRN: [id number] Date of Birth: [birth year] Referring Provider (PT): Dr. Delana Meyer   Encounter Date: 04/13/2021   PT End of Session - 04/13/21 1042     Visit Number 22    Number of Visits 25    Date for PT Re-Evaluation 04/20/21    Authorization Type eval: 01/26/21    PT Start Time 1017    PT Stop Time 1100    PT Time Calculation (min) 43 min    Equipment Utilized During Treatment Gait belt    Activity Tolerance Patient tolerated treatment well    Behavior During Therapy [facility] for tasks assessed/performed               Past Medical History:  Diagnosis Date   Arthritis    right shoulder   Depression    Dyspnea    GERD (gastroesophageal reflux disease)    History of blood clots    Hyperlipidemia    Hypertension    Mild mitral regurgitation    Osteoporosis    Peripheral vascular disease ([place])    Stomach ulcer    Vascular disease    Sees Dr. Delana Meyer   Vertigo    Last episode approx Aug 2015   Wears dentures    full upper    Past Surgical History:  Procedure Laterality Date   ADENOIDECTOMY     AMPUTATION Right 08/11/2020   Procedure: AMPUTATION ABOVE KNEE;  Surgeon: [name], MD;  Location: [facility];  Service: Vascular;  Laterality: Right;   ARTERY BIOPSY Right 07/14/2019   Procedure: BIOPSY TEMPORAL ARTERY;  Surgeon: [name], MD;  Location: [facility];  Service: Vascular;  Laterality: Right;   BREAST CYST ASPIRATION Left    CARDIAC CATHETERIZATION  02/15/2017   [facility]   COLONOSCOPY WITH PROPOFOL N/A 10/22/2017   Procedure: COLONOSCOPY WITH PROPOFOL;  Surgeon: Lucilla Lame, MD;  Location: [place];  Service: Endoscopy;  Laterality: N/A;  specimens not taken--pt on Plavix will be brought back in after 7 days off med   COLONOSCOPY WITH  PROPOFOL N/A 11/05/2017   Procedure: COLONOSCOPY WITH PROPOFOL;  Surgeon: Lucilla Lame, MD;  Location: [place];  Service: Endoscopy;  Laterality: N/A;   ESOPHAGOGASTRODUODENOSCOPY N/A 12/21/2014   Procedure: ESOPHAGOGASTRODUODENOSCOPY (EGD);  Surgeon: Lucilla Lame, MD;  Location: [place];  Service: Gastroenterology;  Laterality: N/A;   ESOPHAGOGASTRODUODENOSCOPY (EGD) WITH PROPOFOL N/A 02/08/2016   Procedure: ESOPHAGOGASTRODUODENOSCOPY (EGD) WITH PROPOFOL;  Surgeon: Lucilla Lame, MD;  Location: [facility];  Service: Endoscopy;  Laterality: N/A;   FASCIOTOMY Right 05/30/2019   Procedure: FASCIOTOMY;  Surgeon: [name], MD;  Location: [facility];  Service: Vascular;  Laterality: Right;   FASCIOTOMY CLOSURE Right 06/04/2019   Procedure: FASCIOTOMY CLOSURE;  Surgeon: [name], MD;  Location: [facility];  Service: Vascular;  Laterality: Right;   HEMORROIDECTOMY  2014   LOWER EXTREMITY ANGIOGRAPHY Right 05/30/2019   Procedure: LOWER EXTREMITY ANGIOGRAPHY;  Surgeon: [name], MD;  Location: [place] [facility];  Service: Cardiovascular;  Laterality: Right;   LOWER EXTREMITY ANGIOGRAPHY Right 07/02/2020   Procedure: LOWER EXTREMITY ANGIOGRAPHY;  Surgeon: [name], MD;  Location: [place] [facility];  Service: Cardiovascular;  Laterality: Right;   PERIPHERAL VASCULAR CATHETERIZATION N/A 02/09/2016   Procedure: Abdominal Aortogram w/Lower Extremity;  Surgeon: [name], MD;  Location: [place] [facility];  Service: Cardiovascular;  Laterality: N/A;   PERIPHERAL VASCULAR CATHETERIZATION Right 02/10/2016   Procedure: Lower Extremity Angiography;  Surgeon: [name], MD;  Location: [place] [facility];  Service: Cardiovascular;  Laterality: Right;   POLYPECTOMY  11/05/2017   Procedure: POLYPECTOMY;  Surgeon: Lucilla Lame, MD;  Location: [place];  Service: Endoscopy;;   SHOULDER ARTHROSCOPY WITH ROTATOR CUFF REPAIR AND  SUBACROMIAL DECOMPRESSION Right 02/27/2020   Procedure: RIGHT SHOULDER ARTHROSCOPY SUBACROMIAL DECOMPRESSION, DISTAL CLAVICLE EXCISION AND MINI-OPEN ROTATOR CUFF REPAIR;  Surgeon: Thornton Park, MD;  Location: [facility];  Service: Orthopedics;  Laterality: Right;   TONSILLECTOMY     VASCULAR SURGERY  [id number]   Fem-Pop Bypass    There were no vitals filed for this visit.   Subjective Assessment - 04/13/21 1251     Subjective Pt states she is doing well today. She denies any resting pain upon arrival today but states she has been having some intermittent soreness in her residual right lower extremity.  Shoulder is doing alright today and patient took pain medication prior to arriving. No falls since last therapy session. No specific questions currently.    Pertinent History Pt has a history of RLE vascular issues since 1991. 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 She received her RLE prosthetic in April 2022 and has been receiving [facility] since her surgery. They discharged her yesterday to continue with OP PT. She continues with phantom RLE pain, especially at night and takes gabapentin with minimal relief. She has also been having R shoulder pain since the surgery which she attributes to using a wheelchair. She underwent R RTC surgery in September of 2021 and had physical therapy afterwards. She has been having issues with bilateral carpal tunnel and has a NCV study scheduled for tomorrow.    Limitations Walking    Patient Stated Goals Ambulate with her prosthetic               TREATMENT   Neuromuscular Re-education  With prosthetic donned: NuStep L2 x 5 minutes BLE only during history (3 minutes unbilled);  All exercises performed in parallel bars without upper extremity support unless otherwise indicated;  Forward and side stepping in // bars without UE support and CGA from therapist x 4 lengths each direction; Backward stepping in // bars  without UE support and CGA from therapist x 2 lengths; 1/2 foam roll forward stepping with LLE to encourage R single leg balance x 10; 1/2 foam L lateral stepping to encourage R single leg balance x 10; Static balance with RLE on ground and LLE forward on 6" step in modified tandem position 2 x 60s; Airex wide stance eyes open/closed and 30s each; Airex narrow stance eyes open/closed and 30s each; Airex narrow stance eyes open with horizontal ball passes around body with head/eye follow as well as body turn and return catch from therapist on opposite side of body x 2 minutes;   Pt educated throughout session about proper posture and technique with exercises. Improved exercise technique, movement at target joints, use of target muscles after min to mod verbal, visual, tactile cues.      Patient demonstrates excellent motivation during session today.  Entire session performed in standing in the parallel bars.  Continued to work on right lower extremity stability and standing balance.  Patient encouraged to continue HEP and follow-up as scheduled.  She will benefit  from PT services to address deficits in strength, balance, and mobility in order to return to full function at home.                            PT Short Term Goals - 03/02/21 0959       PT SHORT TERM GOAL #1   Title Pt will be independent with HEP in order to improve strength and balance in order to decrease fall risk and improve function at home.    Time 4    Period Weeks    Status On-going    Target Date 02/23/21               PT Long Term Goals - 03/02/21 1000       PT LONG TERM GOAL #1   Title Pt will improve BERG by at least 3 points in order to demonstrate clinically significant improvement in balance.    Baseline 01/26/21: 45/56; 03/02/21: 47/56    Time 8    Period Weeks    Status Partially Met    Target Date 03/23/21      PT LONG TERM GOAL #2   Title Pt will improve ABC by at least 13% in  order to demonstrate clinically significant improvement in balance confidence.    Baseline 01/26/21: 57.5%; 03/02/21: 89.4%    Time 8    Period Weeks    Status Achieved      PT LONG TERM GOAL #3   Title Pt will improve FOTO to at least 63 in order to demonstrate clinically significant improvement in function related to her RLE amputation    Baseline 01/26/21: 59; 03/02/21: 71    Time 8    Period Weeks    Status Achieved    Target Date --      PT LONG TERM GOAL #4   Title Pt will increase self-selected 10MWT by at least 0.13 m/s in order to demonstrate clinically significant improvement in community ambulation.    Baseline 01/26/21: 31.5s = 0.32 m/s; 03/02/21: 25.5s = 0.39 m/s;    Time 8    Period Weeks    Status Partially Met    Target Date 03/23/21                   Plan - 04/13/21 1049     Clinical Impression Statement Patient demonstrates excellent motivation during session today.  Entire session performed in standing in the parallel bars.  Continued to work on right lower extremity stability and standing balance.  Patient encouraged to continue HEP and follow-up as scheduled.  She will benefit from PT services to address deficits in strength, balance, and mobility in order to return to full function at home.    Personal Factors and Comorbidities Age;Comorbidity 3+    Comorbidities Depression, osteoporosis, vascular disease    Examination-Activity Limitations Bathing;Locomotion Level;Squat;Stairs;Stand;Transfers    Examination-Participation Restrictions Cleaning;Community Activity;Meal Prep;Yard Work    Stability/Clinical Decision Making Unstable/Unpredictable    Rehab Potential Good    PT Frequency 2x / week    PT Duration 12 weeks    PT Treatment/Interventions ADLs/Self Care Home Management;Aquatic Therapy;Canalith Repostioning;Cryotherapy;Electrical Stimulation;Iontophoresis 75m/ml Dexamethasone;Moist Heat;Traction;Ultrasound;Gait training;Stair training;Therapeutic  activities;Therapeutic exercise;Neuromuscular re-education;Manual techniques;Dry needling;Vestibular;Spinal Manipulations;Joint Manipulations    PT Next Visit Plan strengthening, practice gait training with lofstrand crutches    PT Home Exercise Plan Access Code: [email or web address]    Consulted and Agree with Plan of Care Patient  Patient will benefit from skilled therapeutic intervention in order to improve the following deficits and impairments:  Abnormal gait, Decreased strength, Difficulty walking  Visit Diagnosis: Muscle weakness (generalized)  Difficulty in walking, not elsewhere classified  Unsteadiness on feet     Problem List Patient Active Problem List   Diagnosis Date Noted   Muscle spasm 09/09/2020   S/P AKA (above knee amputation), right ([place]) 09/08/2020   Ischemia of extremity 07/02/2020   Chronic pain in right shoulder 11/25/2019   Encounter for long-term (current) use of high-risk medication 07/16/2019   GCA (giant cell arteritis) ([place]) 07/16/2019   Temporal arteritis ([place]) 07/13/2019   Postoperative wound infection 06/23/2019   Ischemic leg 05/31/2019   Atherosclerosis of native arteries of extremity with intermittent claudication ([place]) 05/30/2019   Depression 05/29/2019   Eczema of lower extremity 03/04/2018   Chronic venous insufficiency 03/02/2018   Personal history of colonic polyps    Family history of colonic polyps    Benign neoplasm of ascending colon    Mitral valve insufficiency 02/08/2017   Dyspnea on exertion 02/07/2017   Non-rheumatic mitral regurgitation 02/07/2017   Precordial pain 02/07/2017   CAD (coronary artery disease) 12/21/2016   Essential hypertension 12/21/2016   Compression fracture of lumbar spine, non-traumatic ([place]) 04/26/2016   Compression fracture of L3 lumbar vertebra 04/20/2016   PVD (peripheral vascular disease) ([place]) 02/24/2016   Family history of premature CAD 02/24/2016   Gastritis    Other specified  diseases of esophagus    Hiatal hernia    Gastritis and gastroduodenitis    Ischemia of lower extremity    Arterial occlusion ([facility])    Atherosclerosis of aorta ([place])    History of smoking    Hyperlipidemia    Pain in the chest    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 02/05/2016   Carotid artery stenosis 01/04/2016   Vertigo, benign paroxysmal 12/28/2015   Clinical depression 09/06/2015   History of alcoholism ([place]) 09/06/2015   Angiopathy, 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[place]) 09/06/2015   Atherosclerosis of native arteries of extremity with rest pain ([place]) 09/06/2015   Acute non-recurrent maxillary sinusitis 07/14/2015   Vaginal pruritus 05/26/2015   Chronic recurrent major depressive disorder ([place]) 03/09/2015   Osteoporosis, post-menopausal 03/09/2015   Peripheral blood vessel disorder ([place]) 03/09/2015   Acid reflux 03/09/2015   GERD (gastroesophageal reflux disease) 12/21/2014   Carotid artery narrowing 01/28/2014   Peripheral arterial occlusive disease ([place]) 06/08/2011   Occlusion and stenosis of unspecified carotid artery 06/08/2011   PAD (peripheral artery disease) ([place]) 06/08/2011   Lyndel Safe Nanie Dunkleberger PT, DPT, GCS  Aliana Kreischer 04/13/2021, 1:31 PM  [facility] [address] [place], [place], [address] Phone: [phone]   Fax:  [phone]  Name: Sara [name] MRN: [id number] Date of Birth: Nov 18, 1950

## 2021-04-15 ENCOUNTER — Other Ambulatory Visit: Payer: Self-pay

## 2021-04-15 ENCOUNTER — Ambulatory Visit: Payer: Medicare Other

## 2021-04-15 DIAGNOSIS — R262 Difficulty in walking, not elsewhere classified: Secondary | ICD-10-CM

## 2021-04-15 DIAGNOSIS — M6281 Muscle weakness (generalized): Secondary | ICD-10-CM

## 2021-04-15 NOTE — Patient Instructions (Signed)
Access Code: [email or web address] URL: [email or web address] Date: 04/15/2021 Prepared by: Roxana Hires  Exercises Supine Bridge - 1 x daily - 7 x weekly - 2 sets - 10 reps - 3s hold Figure 4 Bridge - 1 x daily - 7 x weekly - 2 sets - 10 reps - 3s hold Clamshell (Mirrored) - 1 x daily - 7 x weekly - 2 sets - 10 reps - 3s hold Sidelying Reverse Clamshell (Mirrored) - 1 x daily - 7 x weekly - 2 sets - 10 reps Sidelying Hip Abduction - 1 x daily - 7 x weekly - 2 sets - 10 reps - 3s hold Supine Active Straight Leg Raise - 1 x daily - 7 x weekly - 2 sets - 10 reps - 2-3s concentric phase (lift), 4-6s eccentric phase (lowering) hold Mini Squat with Counter Support - 1 x daily - 7 x weekly - 2 sets - 10 reps Standing Hip Flexor Stretch - 2 x daily - 7 x weekly - 3-5 reps - 45s hold Supine Piriformis Stretch with Leg Straight - 1 x daily - 7 x weekly - 3 reps - 45s hold Pigeon Pose - 1 x daily - 7 x weekly - 3 reps - 45s hold Quadruped Adductor Stretch - 1 x daily - 7 x weekly - 3 reps - 45s hold Sidelying IT Band Foam Roll Mobilization - 1 x daily - 7 x weekly Figure 4 Gluteus Mobilization on Foam Roll - 1 x daily - 7 x weekly Hamstring Mobilization on Foam Roll - 1 x daily - 7 x weekly Adductor Mobilization with Foam Roll - 1 x daily - 7 x weekly

## 2021-04-15 NOTE — Therapy (Signed)
[facility] [address] [place], [place], [address] Phone: [phone]   Fax:  [phone]  Physical Therapy Treatment  Patient Details  Name: Sara [name] MRN: [id number] Date of Birth: [birth year] Referring Provider (PT): Dr. Delana Meyer   Encounter Date: 04/15/2021   PT End of Session - 04/15/21 1022     Visit Number 23    Number of Visits 25    Date for PT Re-Evaluation 04/20/21    Authorization Type eval: 01/26/21    PT Start Time 1017    PT Stop Time 1100    PT Time Calculation (min) 43 min    Equipment Utilized During Treatment Gait belt    Activity Tolerance Patient tolerated treatment well    Behavior During Therapy [facility] for tasks assessed/performed               Past Medical History:  Diagnosis Date   Arthritis    right shoulder   Depression    Dyspnea    GERD (gastroesophageal reflux disease)    History of blood clots    Hyperlipidemia    Hypertension    Mild mitral regurgitation    Osteoporosis    Peripheral vascular disease ([place])    Stomach ulcer    Vascular disease    Sees Dr. Delana Meyer   Vertigo    Last episode approx Aug 2015   Wears dentures    full upper    Past Surgical History:  Procedure Laterality Date   ADENOIDECTOMY     AMPUTATION Right 08/11/2020   Procedure: AMPUTATION ABOVE KNEE;  Surgeon: [name], MD;  Location: [facility];  Service: Vascular;  Laterality: Right;   ARTERY BIOPSY Right 07/14/2019   Procedure: BIOPSY TEMPORAL ARTERY;  Surgeon: [name], MD;  Location: [facility];  Service: Vascular;  Laterality: Right;   BREAST CYST ASPIRATION Left    CARDIAC CATHETERIZATION  02/15/2017   [facility]   COLONOSCOPY WITH PROPOFOL N/A 10/22/2017   Procedure: COLONOSCOPY WITH PROPOFOL;  Surgeon: Lucilla Lame, MD;  Location: [place];  Service: Endoscopy;  Laterality: N/A;  specimens not taken--pt on Plavix will be brought back in after 7 days off med   COLONOSCOPY WITH  PROPOFOL N/A 11/05/2017   Procedure: COLONOSCOPY WITH PROPOFOL;  Surgeon: Lucilla Lame, MD;  Location: [place];  Service: Endoscopy;  Laterality: N/A;   ESOPHAGOGASTRODUODENOSCOPY N/A 12/21/2014   Procedure: ESOPHAGOGASTRODUODENOSCOPY (EGD);  Surgeon: Lucilla Lame, MD;  Location: [place];  Service: Gastroenterology;  Laterality: N/A;   ESOPHAGOGASTRODUODENOSCOPY (EGD) WITH PROPOFOL N/A 02/08/2016   Procedure: ESOPHAGOGASTRODUODENOSCOPY (EGD) WITH PROPOFOL;  Surgeon: Lucilla Lame, MD;  Location: [facility];  Service: Endoscopy;  Laterality: N/A;   FASCIOTOMY Right 05/30/2019   Procedure: FASCIOTOMY;  Surgeon: [name], MD;  Location: [facility];  Service: Vascular;  Laterality: Right;   FASCIOTOMY CLOSURE Right 06/04/2019   Procedure: FASCIOTOMY CLOSURE;  Surgeon: [name], MD;  Location: [facility];  Service: Vascular;  Laterality: Right;   HEMORROIDECTOMY  2014   LOWER EXTREMITY ANGIOGRAPHY Right 05/30/2019   Procedure: LOWER EXTREMITY ANGIOGRAPHY;  Surgeon: [name], MD;  Location: [place] [facility];  Service: Cardiovascular;  Laterality: Right;   LOWER EXTREMITY ANGIOGRAPHY Right 07/02/2020   Procedure: LOWER EXTREMITY ANGIOGRAPHY;  Surgeon: [name], MD;  Location: [place] [facility];  Service: Cardiovascular;  Laterality: Right;   PERIPHERAL VASCULAR CATHETERIZATION N/A 02/09/2016   Procedure: Abdominal Aortogram w/Lower Extremity;  Surgeon: [name], MD;  Location: [place] [facility];  Service: Cardiovascular;  Laterality: N/A;   PERIPHERAL VASCULAR CATHETERIZATION Right 02/10/2016   Procedure: Lower Extremity Angiography;  Surgeon: [name], MD;  Location: [place] [facility];  Service: Cardiovascular;  Laterality: Right;   POLYPECTOMY  11/05/2017   Procedure: POLYPECTOMY;  Surgeon: Lucilla Lame, MD;  Location: [place];  Service: Endoscopy;;   SHOULDER ARTHROSCOPY WITH ROTATOR CUFF REPAIR AND  SUBACROMIAL DECOMPRESSION Right 02/27/2020   Procedure: RIGHT SHOULDER ARTHROSCOPY SUBACROMIAL DECOMPRESSION, DISTAL CLAVICLE EXCISION AND MINI-OPEN ROTATOR CUFF REPAIR;  Surgeon: Thornton Park, MD;  Location: [facility];  Service: Orthopedics;  Laterality: Right;   TONSILLECTOMY     VASCULAR SURGERY  [id number],5883   Fem-Pop Bypass    There were no vitals filed for this visit.   Subjective Assessment - 04/15/21 1021     Subjective Pt states she is doing well today. She denies any resting pain upon arrival today.  Shoulder is doing alright today and patient took pain medication prior to arriving. No falls since last therapy session. No specific questions currently.    Pertinent History Pt has a history of RLE vascular issues since 1991. 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 She received her RLE prosthetic in April 2022 and has been receiving [facility] since her surgery. They discharged her yesterday to continue with OP PT. She continues with phantom RLE pain, especially at night and takes gabapentin with minimal relief. She has also been having R shoulder pain since the surgery which she attributes to using a wheelchair. She underwent R RTC surgery in September of 2021 and had physical therapy afterwards. She has been having issues with bilateral carpal tunnel and has a NCV study scheduled for tomorrow.    Limitations Walking    Patient Stated Goals Ambulate with her prosthetic               TREATMENT   Ther-ex  With prosthetic donned: NuStep L2-3 x 5 minutes BLE only during history;  With prosthetic doffed: Supine R SLR hip flexion with manual resistance from therapist 2 x 20; Supine RLE bridge with residual RLE resting on stool (towel on top for cushion), arms at side, 3s hold, 2 x 10; Supine RLE long axis manually resisted external and internal rotation 2 x 20 each; Supine RLE adduction with manual resistance 2 x 20; L sidelying R hip abduction  with manual resistance 2 x 20; L sidelying R hip extension from 90 hip flexion to available end range extension with manual resistance 2 x 20;   Neuromuscular Re-education  With prosthetic donned: Obstacle course in rehab gym with single left upper extremity Lofstrand crutch including 6 and 12 inch hurdle steps, ascending/descending 6 inch step, Airex pad ascending/descending, and padded floor mat.  Patient has multiple losses of balance requiring min to mod therapist assistance to prevent her from falling.  3 laps performed with seated rest breaks between laps;   Pt educated throughout session about proper posture and technique with exercises. Improved exercise technique, movement at target joints, use of target muscles after min to mod verbal, visual, tactile cues.      Patient demonstrates excellent motivation during session today.  Returned to mat table strengthening during session today.  Challenged balance with obstacle course in rehab gym with single Lofstrand crutch.  This is very fatiguing for patient and she experiences multiple losses of balance with obstacles.  She  will need a re certification next week.  Patient encouraged to continue HEP and follow-up as scheduled.  She will benefit from PT services to address deficits in strength, balance, and mobility in order to return to full function at home.                            PT Short Term Goals - 03/02/21 0959       PT SHORT TERM GOAL #1   Title Pt will be independent with HEP in order to improve strength and balance in order to decrease fall risk and improve function at home.    Time 4    Period Weeks    Status On-going    Target Date 02/23/21               PT Long Term Goals - 03/02/21 1000       PT LONG TERM GOAL #1   Title Pt will improve BERG by at least 3 points in order to demonstrate clinically significant improvement in balance.    Baseline 01/26/21: 45/56; 03/02/21: 47/56    Time 8     Period Weeks    Status Partially Met    Target Date 03/23/21      PT LONG TERM GOAL #2   Title Pt will improve ABC by at least 13% in order to demonstrate clinically significant improvement in balance confidence.    Baseline 01/26/21: 57.5%; 03/02/21: 89.4%    Time 8    Period Weeks    Status Achieved      PT LONG TERM GOAL #3   Title Pt will improve FOTO to at least 63 in order to demonstrate clinically significant improvement in function related to her RLE amputation    Baseline 01/26/21: 59; 03/02/21: 71    Time 8    Period Weeks    Status Achieved    Target Date --      PT LONG TERM GOAL #4   Title Pt will increase self-selected 10MWT by at least 0.13 m/s in order to demonstrate clinically significant improvement in community ambulation.    Baseline 01/26/21: 31.5s = 0.32 m/s; 03/02/21: 25.5s = 0.39 m/s;    Time 8    Period Weeks    Status Partially Met    Target Date 03/23/21                   Plan - 04/15/21 1023     Clinical Impression Statement Patient demonstrates excellent motivation during session today.  Returned to mat table strengthening during session today.  Challenged balance with obstacle course in rehab gym with single Lofstrand crutch.  This is very fatiguing for patient and she experiences multiple losses of balance with obstacles.  She will need a re certification next week.  Patient encouraged to continue HEP and follow-up as scheduled.  She will benefit from PT services to address deficits in strength, balance, and mobility in order to return to full function at home.    Personal Factors and Comorbidities Age;Comorbidity 3+    Comorbidities Depression, osteoporosis, vascular disease    Examination-Activity Limitations Bathing;Locomotion Level;Squat;Stairs;Stand;Transfers    Examination-Participation Restrictions Cleaning;Community Activity;Meal Prep;Yard Work    Stability/Clinical Decision Making Unstable/Unpredictable    Rehab Potential Good    PT  Frequency 2x / week    PT Duration 12 weeks    PT Treatment/Interventions ADLs/Self Care Home Management;Aquatic Therapy;Canalith Repostioning;Cryotherapy;Electrical Stimulation;Iontophoresis 47m/ml Dexamethasone;Moist Heat;Traction;Ultrasound;Gait training;Stair  training;Therapeutic activities;Therapeutic exercise;Neuromuscular re-education;Manual techniques;Dry needling;Vestibular;Spinal Manipulations;Joint Manipulations    PT Next Visit Plan strengthening, practice gait training with lofstrand crutches    PT Home Exercise Plan Access Code: [email or web address]    Consulted and Agree with Plan of Care Patient               Patient will benefit from skilled therapeutic intervention in order to improve the following deficits and impairments:  Abnormal gait, Decreased strength, Difficulty walking  Visit Diagnosis: Muscle weakness (generalized)  Difficulty in walking, not elsewhere classified     Problem List Patient Active Problem List   Diagnosis Date Noted   Muscle spasm 09/09/2020   S/P AKA (above knee amputation), right ([place]) 09/08/2020   Ischemia of extremity 07/02/2020   Chronic pain in right shoulder 11/25/2019   Encounter for long-term (current) use of high-risk medication 07/16/2019   GCA (giant cell arteritis) ([place]) 07/16/2019   Temporal arteritis ([place]) 07/13/2019   Postoperative wound infection 06/23/2019   Ischemic leg 05/31/2019   Atherosclerosis of native arteries of extremity with intermittent claudication ([place]) 05/30/2019   Depression 05/29/2019   Eczema of lower extremity 03/04/2018   Chronic venous insufficiency 03/02/2018   Personal history of colonic polyps    Family history of colonic polyps    Benign neoplasm of ascending colon    Mitral valve insufficiency 02/08/2017   Dyspnea on exertion 02/07/2017   Non-rheumatic mitral regurgitation 02/07/2017   Precordial pain 02/07/2017   CAD (coronary artery disease) 12/21/2016   Essential hypertension 12/21/2016    Compression fracture of lumbar spine, non-traumatic ([place]) 04/26/2016   Compression fracture of L3 lumbar vertebra 04/20/2016   PVD (peripheral vascular disease) ([place]) 02/24/2016   Family history of premature CAD 02/24/2016   Gastritis    Other specified diseases of esophagus    Hiatal hernia    Gastritis and gastroduodenitis    Ischemia of lower extremity    Arterial occlusion (HCC)    Atherosclerosis of aorta ([place])    History of smoking    Hyperlipidemia    Pain in the chest    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 02/05/2016   Carotid artery stenosis 01/04/2016   Vertigo, benign paroxysmal 12/28/2015   Clinical depression 09/06/2015   History of alcoholism ([place]) 09/06/2015   Angiopathy, 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[place]) 09/06/2015   Atherosclerosis of native arteries of extremity with rest pain ([place]) 09/06/2015   Acute non-recurrent maxillary sinusitis 07/14/2015   Vaginal pruritus 05/26/2015   Chronic recurrent major depressive disorder ([place]) 03/09/2015   Osteoporosis, post-menopausal 03/09/2015   Peripheral blood vessel disorder ([place]) 03/09/2015   Acid reflux 03/09/2015   GERD (gastroesophageal reflux disease) 12/21/2014   Carotid artery narrowing 01/28/2014   Peripheral arterial occlusive disease ([place]) 06/08/2011   Occlusion and stenosis of unspecified carotid artery 06/08/2011   PAD (peripheral artery disease) ([place]) 06/08/2011   Lyndel Safe Jesscia Imm PT, DPT, GCS  Amrit Erck 04/15/2021, 1:53 PM  [facility] [address] [place], [place], [address] Phone: [phone]   Fax:  [phone]  Name: LATRISA HELLUMS MRN: [id number] Date of Birth: May 15, 1951

## 2021-04-19 ENCOUNTER — Other Ambulatory Visit (INDEPENDENT_AMBULATORY_CARE_PROVIDER_SITE_OTHER): Payer: Self-pay | Admitting: Nurse Practitioner

## 2021-04-20 ENCOUNTER — Other Ambulatory Visit: Payer: Self-pay

## 2021-04-20 ENCOUNTER — Ambulatory Visit: Payer: Medicare Other

## 2021-04-20 DIAGNOSIS — M6281 Muscle weakness (generalized): Secondary | ICD-10-CM | POA: Diagnosis not present

## 2021-04-20 DIAGNOSIS — R262 Difficulty in walking, not elsewhere classified: Secondary | ICD-10-CM

## 2021-04-20 NOTE — Therapy (Signed)
[facility] [address] [place], [place], [address] Phone: [phone]   Fax:  [phone]  Physical Therapy Treatment/Recertification  Patient Details  Name: Sara [name] MRN: [id number] Date of Birth: [birth year] Referring Provider (PT): Dr. Delana Meyer   Encounter Date: 04/20/2021   PT End of Session - 04/20/21 1008     Visit Number 24    Number of Visits 38    Date for PT Re-Evaluation 07/13/21    Authorization Type eval: 01/26/21    PT Start Time 1000    PT Stop Time 1045    PT Time Calculation (min) 45 min    Equipment Utilized During Treatment Gait belt    Activity Tolerance Patient tolerated treatment well    Behavior During Therapy [facility] for tasks assessed/performed               Past Medical History:  Diagnosis Date   Arthritis    right shoulder   Depression    Dyspnea    GERD (gastroesophageal reflux disease)    History of blood clots    Hyperlipidemia    Hypertension    Mild mitral regurgitation    Osteoporosis    Peripheral vascular disease ([place])    Stomach ulcer    Vascular disease    Sees Dr. Delana Meyer   Vertigo    Last episode approx Aug 2015   Wears dentures    full upper    Past Surgical History:  Procedure Laterality Date   ADENOIDECTOMY     AMPUTATION Right 08/11/2020   Procedure: AMPUTATION ABOVE KNEE;  Surgeon: [name], MD;  Location: [facility];  Service: Vascular;  Laterality: Right;   ARTERY BIOPSY Right 07/14/2019   Procedure: BIOPSY TEMPORAL ARTERY;  Surgeon: [name], MD;  Location: [facility];  Service: Vascular;  Laterality: Right;   BREAST CYST ASPIRATION Left    CARDIAC CATHETERIZATION  02/15/2017   [facility]   COLONOSCOPY WITH PROPOFOL N/A 10/22/2017   Procedure: COLONOSCOPY WITH PROPOFOL;  Surgeon: Lucilla Lame, MD;  Location: [place];  Service: Endoscopy;  Laterality: N/A;  specimens not taken--pt on Plavix will be brought back in after 7 days off med    COLONOSCOPY WITH PROPOFOL N/A 11/05/2017   Procedure: COLONOSCOPY WITH PROPOFOL;  Surgeon: Lucilla Lame, MD;  Location: [place];  Service: Endoscopy;  Laterality: N/A;   ESOPHAGOGASTRODUODENOSCOPY N/A 12/21/2014   Procedure: ESOPHAGOGASTRODUODENOSCOPY (EGD);  Surgeon: Lucilla Lame, MD;  Location: [place];  Service: Gastroenterology;  Laterality: N/A;   ESOPHAGOGASTRODUODENOSCOPY (EGD) WITH PROPOFOL N/A 02/08/2016   Procedure: ESOPHAGOGASTRODUODENOSCOPY (EGD) WITH PROPOFOL;  Surgeon: Lucilla Lame, MD;  Location: [facility];  Service: Endoscopy;  Laterality: N/A;   FASCIOTOMY Right 05/30/2019   Procedure: FASCIOTOMY;  Surgeon: [name], MD;  Location: [facility];  Service: Vascular;  Laterality: Right;   FASCIOTOMY CLOSURE Right 06/04/2019   Procedure: FASCIOTOMY CLOSURE;  Surgeon: [name], MD;  Location: [facility];  Service: Vascular;  Laterality: Right;   HEMORROIDECTOMY  2014   LOWER EXTREMITY ANGIOGRAPHY Right 05/30/2019   Procedure: LOWER EXTREMITY ANGIOGRAPHY;  Surgeon: [name], MD;  Location: [place] [facility];  Service: Cardiovascular;  Laterality: Right;   LOWER EXTREMITY ANGIOGRAPHY Right 07/02/2020   Procedure: LOWER EXTREMITY ANGIOGRAPHY;  Surgeon: [name], MD;  Location: [place] [facility];  Service: Cardiovascular;  Laterality: Right;   PERIPHERAL VASCULAR CATHETERIZATION N/A 02/09/2016   Procedure: Abdominal Aortogram w/Lower Extremity;  Surgeon: [name], MD;  Location: [place] [facility];  Service: Cardiovascular;  Laterality: N/A;   PERIPHERAL VASCULAR CATHETERIZATION Right 02/10/2016   Procedure: Lower Extremity Angiography;  Surgeon: [name], MD;  Location: [place] [facility];  Service: Cardiovascular;  Laterality: Right;   POLYPECTOMY  11/05/2017   Procedure: POLYPECTOMY;  Surgeon: Lucilla Lame, MD;  Location: [place];  Service: Endoscopy;;   SHOULDER ARTHROSCOPY WITH ROTATOR CUFF  REPAIR AND SUBACROMIAL DECOMPRESSION Right 02/27/2020   Procedure: RIGHT SHOULDER ARTHROSCOPY SUBACROMIAL DECOMPRESSION, DISTAL CLAVICLE EXCISION AND MINI-OPEN ROTATOR CUFF REPAIR;  Surgeon: Thornton Park, MD;  Location: [facility];  Service: Orthopedics;  Laterality: Right;   TONSILLECTOMY     VASCULAR SURGERY  [id number]   Fem-Pop Bypass    There were no vitals filed for this visit.   Subjective Assessment - 04/20/21 1007     Subjective Pt states she is doing well today. She denies any resting pain upon arrival today.  Shoulder is doing alright today and patient took pain medication prior to arriving. No falls since last therapy session. No specific questions currently.    Pertinent History Pt has a history of RLE vascular issues since 1991. 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 She received her RLE prosthetic in April 2022 and has been receiving [facility] since her surgery. They discharged her yesterday to continue with OP PT. She continues with phantom RLE pain, especially at night and takes gabapentin with minimal relief. She has also been having R shoulder pain since the surgery which she attributes to using a wheelchair. She underwent R RTC surgery in September of 2021 and had physical therapy afterwards. She has been having issues with bilateral carpal tunnel and has a NCV study scheduled for tomorrow.    Limitations Walking    Patient Stated Goals Ambulate with her prosthetic               TREATMENT   Ther-ex  With prosthetic doffed: Supine R SLR hip flexion with manual resistance from therapist 2 x 20; Supine RLE bridge with residual RLE resting on stool (towel on top for cushion), arms at side, 3s hold, 2 x 20; Supine RLE long axis manually resisted external and internal rotation 2 x 20 each; Supine RLE adduction with manual resistance 2 x 20; L sidelying R hip abduction with manual resistance 2 x 20; L sidelying R hip extension from  90 hip flexion to available end range extension with manual resistance 2 x 20; Prone R hip flexor stretch with towel under residual RLE x 2 minutes (education about how to perform as part of HEP);   Neuromuscular Re-education  With prosthetic donned:  Forward, backward, and side stepping in // bars without UE support and CGA from therapist x 4 lengths each direction; 1/2 foam roll forward and backward stepping with LLE x multiple bouts;   Pt educated throughout session about proper posture and technique with exercises. Improved exercise technique, movement at target joints, use of target muscles after min to mod verbal, visual, tactile cues.     Patient demonstrates excellent motivation during session today.  Updated outcome measures and goals with patient during 04/06/21 visit. At that time her FOTO score remained at 71. Her BERG increased by 2 points to 49/56.  Her 32mgait speed increased to 0.48 m/s. Overall patient demonstrates significant improvement and has progressed to ambulation with bilateral Lofstrand crutches. She is able to ambulate with  therapist with a single Lofstrand crutch however has at least one loss of balance each session requiring therapist assist to correct. Continued with additional balance training working on right single-leg stability in parallel bars. No HEP progression today. Patient's condition has the potential to improve in response to therapy. Maximum improvement is yet to be obtained. The anticipated improvement is attainable and reasonable in a generally predictable time. Patient encouraged to follow-up as scheduled. Pt will benefit from PT services to address deficits in strength, balance, and mobility in order to return to full function at home.                           PT Short Term Goals - 04/20/21 1021       PT SHORT TERM GOAL #1   Title Pt will be independent with HEP in order to improve strength and balance in order to decrease fall  risk and improve function at home.    Time 6    Period Weeks    Status On-going    Target Date 06/01/21               PT Long Term Goals - 04/20/21 1021       PT LONG TERM GOAL #1   Title Pt will improve BERG to at least 52/56 in order to demonstrate clinically significant improvement in balance and decrease risk for falls;    Baseline 01/26/21: 45/56; 03/02/21: 47/56; 04/20/21: 49/56    Time 12    Period Weeks    Status Partially Met    Target Date 07/13/21      PT LONG TERM GOAL #2   Title Pt will improve ABC by at least 13% in order to demonstrate clinically significant improvement in balance confidence.    Baseline 01/26/21: 57.5%; 03/02/21: 89.4%    Time 8    Period Weeks    Status Achieved    Target Date 07/13/21      PT LONG TERM GOAL #3   Title Pt will improve FOTO to at least 63 in order to demonstrate clinically significant improvement in function related to her RLE amputation    Baseline 01/26/21: 59; 03/02/21: 71; 04/06/21: 71    Time 8    Period Weeks    Status Achieved      PT LONG TERM GOAL #4   Title Pt will increase self-selected 10MWT to at least 0.6 m/s in order to demonstrate clinically significant improvement in community ambulation.    Baseline 01/26/21: 31.5s = 0.32 m/s; 03/02/21: 25.5s = 0.39 m/s; 04/06/21: 20.7s = 0.48 m/s    Time 8    Period Weeks    Status Partially Met    Target Date 07/13/21                   Plan - 04/20/21 1008     Clinical Impression Statement Patient demonstrates excellent motivation during session today.  Updated outcome measures and goals with patient during 04/06/21 visit. At that time her FOTO score remained at 71. Her BERG increased by 2 points to 49/56.  Her 91m gait speed increased to 0.48 m/s. Overall patient demonstrates significant improvement and has progressed to ambulation with bilateral Lofstrand crutches. She is able to ambulate with therapist with a single Lofstrand crutch however has at least one loss of  balance each session requiring therapist assist to correct. Continued with additional balance training working on right single-leg stability in parallel  bars. No HEP progression today. Patient's condition has the potential to improve in response to therapy. Maximum improvement is yet to be obtained. The anticipated improvement is attainable and reasonable in a generally predictable time. Patient encouraged to follow-up as scheduled. Pt will benefit from PT services to address deficits in strength, balance, and mobility in order to return to full function at home.    Personal Factors and Comorbidities Age;Comorbidity 3+    Comorbidities Depression, osteoporosis, vascular disease    Examination-Activity Limitations Bathing;Locomotion Level;Squat;Stairs;Stand;Transfers    Examination-Participation Restrictions Cleaning;Community Activity;Meal Prep;Yard Work    Stability/Clinical Decision Making Unstable/Unpredictable    Rehab Potential Good    PT Frequency 2x / week    PT Duration 12 weeks    PT Treatment/Interventions ADLs/Self Care Home Management;Aquatic Therapy;Canalith Repostioning;Cryotherapy;Electrical Stimulation;Iontophoresis 4mg /ml Dexamethasone;Moist Heat;Traction;Ultrasound;Gait training;Stair training;Therapeutic activities;Therapeutic exercise;Neuromuscular re-education;Manual techniques;Dry needling;Vestibular;Spinal Manipulations;Joint Manipulations    PT Next Visit Plan strengthening, practice gait training with lofstrand crutches    PT Home Exercise Plan Access Code: [email or web address]    Consulted and Agree with Plan of Care Patient               Patient will benefit from skilled therapeutic intervention in order to improve the following deficits and impairments:  Abnormal gait, Decreased strength, Difficulty walking  Visit Diagnosis: Muscle weakness (generalized)  Difficulty in walking, not elsewhere classified     Problem List Patient Active Problem List   Diagnosis Date  Noted   Muscle spasm 09/09/2020   S/P AKA (above knee amputation), right ([place]) 09/08/2020   Ischemia of extremity 07/02/2020   Chronic pain in right shoulder 11/25/2019   Encounter for long-term (current) use of high-risk medication 07/16/2019   GCA (giant cell arteritis) ([place]) 07/16/2019   Temporal arteritis ([place]) 07/13/2019   Postoperative wound infection 06/23/2019   Ischemic leg 05/31/2019   Atherosclerosis of native arteries of extremity with intermittent claudication ([place]) 05/30/2019   Depression 05/29/2019   Eczema of lower extremity 03/04/2018   Chronic venous insufficiency 03/02/2018   Personal history of colonic polyps    Family history of colonic polyps    Benign neoplasm of ascending colon    Mitral valve insufficiency 02/08/2017   Dyspnea on exertion 02/07/2017   Non-rheumatic mitral regurgitation 02/07/2017   Precordial pain 02/07/2017   CAD (coronary artery disease) 12/21/2016   Essential hypertension 12/21/2016   Compression fracture of lumbar spine, non-traumatic ([place]) 04/26/2016   Compression fracture of L3 lumbar vertebra 04/20/2016   PVD (peripheral vascular disease) ([place]) 02/24/2016   Family history of premature CAD 02/24/2016   Gastritis    Other specified diseases of esophagus    Hiatal hernia    Gastritis and gastroduodenitis    Ischemia of lower extremity    Arterial occlusion (H[facility])    Atherosclerosis of aorta ([place])    History of smoking    Hyperlipidemia    Pain in the chest    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 02/05/2016   Carotid artery stenosis 01/04/2016   Vertigo, benign paroxysmal 12/28/2015   Clinical depression 09/06/2015   History of alcoholism ([place]) 09/06/2015   Angiopathy, 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[place]) 09/06/2015   Atherosclerosis of native arteries of extremity with rest pain ([place]) 09/06/2015   Acute non-recurrent maxillary sinusitis 07/14/2015   Vaginal pruritus 05/26/2015   Chronic recurrent major depressive disorder  ([place]) 03/09/2015   Osteoporosis, post-menopausal 03/09/2015   Peripheral blood vessel disorder ([place]) 03/09/2015   Acid reflux 03/09/2015   GERD (gastroesophageal reflux disease)  12/21/2014   Carotid artery narrowing 01/28/2014   Peripheral arterial occlusive disease ([place]) 06/08/2011   Occlusion and stenosis of unspecified carotid artery 06/08/2011   PAD (peripheral artery disease) ([place]) 06/08/2011   Phillips Grout PT, DPT, GCS  Percy Winterrowd 04/20/2021, 12:53 PM  [facility] [address] [place], [place], [address] Phone: [phone]   Fax:  [phone]  Name: Sara [name] MRN: [id number] Date of Birth: Dec 13, 1950

## 2021-04-20 NOTE — Telephone Encounter (Signed)
Is this ok to refill?  

## 2021-04-22 ENCOUNTER — Ambulatory Visit: Payer: Medicare Other | Attending: Vascular Surgery

## 2021-04-22 ENCOUNTER — Other Ambulatory Visit: Payer: Self-pay

## 2021-04-22 DIAGNOSIS — R262 Difficulty in walking, not elsewhere classified: Secondary | ICD-10-CM | POA: Diagnosis present

## 2021-04-22 DIAGNOSIS — M6281 Muscle weakness (generalized): Secondary | ICD-10-CM | POA: Insufficient documentation

## 2021-04-22 DIAGNOSIS — G546 Phantom limb syndrome with pain: Secondary | ICD-10-CM | POA: Diagnosis present

## 2021-04-22 DIAGNOSIS — R2681 Unsteadiness on feet: Secondary | ICD-10-CM | POA: Diagnosis present

## 2021-04-22 NOTE — Therapy (Signed)
[facility] [address]. [place], [place], [address] Phone: [phone]   Fax:  [phone]  Physical Therapy Treatment  Patient Details  Name: Sara [name] MRN: [id number] Date of Birth: [birth year] Referring Provider (PT): Dr. Delana Meyer   Encounter Date: 04/22/2021   PT End of Session - 04/22/21 1306     Visit Number 25    Number of Visits 79    Date for PT Re-Evaluation 07/13/21    Authorization Type eval: 01/26/21    PT Start Time 1015    PT Stop Time 1100    PT Time Calculation (min) 45 min    Equipment Utilized During Treatment Gait belt    Activity Tolerance Patient tolerated treatment well    Behavior During Therapy [facility] for tasks assessed/performed                Past Medical History:  Diagnosis Date   Arthritis    right shoulder   Depression    Dyspnea    GERD (gastroesophageal reflux disease)    History of blood clots    Hyperlipidemia    Hypertension    Mild mitral regurgitation    Osteoporosis    Peripheral vascular disease ([place])    Stomach ulcer    Vascular disease    Sees Dr. Delana Meyer   Vertigo    Last episode approx Aug 2015   Wears dentures    full upper    Past Surgical History:  Procedure Laterality Date   ADENOIDECTOMY     AMPUTATION Right 08/11/2020   Procedure: AMPUTATION ABOVE KNEE;  Surgeon: [name], MD;  Location: [facility];  Service: Vascular;  Laterality: Right;   ARTERY BIOPSY Right 07/14/2019   Procedure: BIOPSY TEMPORAL ARTERY;  Surgeon: [name], MD;  Location: [facility];  Service: Vascular;  Laterality: Right;   BREAST CYST ASPIRATION Left    CARDIAC CATHETERIZATION  02/15/2017   [facility]   COLONOSCOPY WITH PROPOFOL N/A 10/22/2017   Procedure: COLONOSCOPY WITH PROPOFOL;  Surgeon: Lucilla Lame, MD;  Location: [place];  Service: Endoscopy;  Laterality: N/A;  specimens not taken--pt on Plavix will be brought back in after 7 days off med   COLONOSCOPY  WITH PROPOFOL N/A 11/05/2017   Procedure: COLONOSCOPY WITH PROPOFOL;  Surgeon: Lucilla Lame, MD;  Location: [place];  Service: Endoscopy;  Laterality: N/A;   ESOPHAGOGASTRODUODENOSCOPY N/A 12/21/2014   Procedure: ESOPHAGOGASTRODUODENOSCOPY (EGD);  Surgeon: Lucilla Lame, MD;  Location: [place];  Service: Gastroenterology;  Laterality: N/A;   ESOPHAGOGASTRODUODENOSCOPY (EGD) WITH PROPOFOL N/A 02/08/2016   Procedure: ESOPHAGOGASTRODUODENOSCOPY (EGD) WITH PROPOFOL;  Surgeon: Lucilla Lame, MD;  Location: [facility];  Service: Endoscopy;  Laterality: N/A;   FASCIOTOMY Right 05/30/2019   Procedure: FASCIOTOMY;  Surgeon: [name], MD;  Location: [facility];  Service: Vascular;  Laterality: Right;   FASCIOTOMY CLOSURE Right 06/04/2019   Procedure: FASCIOTOMY CLOSURE;  Surgeon: [name], MD;  Location: [facility];  Service: Vascular;  Laterality: Right;   HEMORROIDECTOMY  2014   LOWER EXTREMITY ANGIOGRAPHY Right 05/30/2019   Procedure: LOWER EXTREMITY ANGIOGRAPHY;  Surgeon: [name], MD;  Location: [place] [facility];  Service: Cardiovascular;  Laterality: Right;   LOWER EXTREMITY ANGIOGRAPHY Right 07/02/2020   Procedure: LOWER EXTREMITY ANGIOGRAPHY;  Surgeon: [name], MD;  Location: [place] [facility];  Service: Cardiovascular;  Laterality: Right;   PERIPHERAL VASCULAR CATHETERIZATION N/A 02/09/2016   Procedure: Abdominal Aortogram w/Lower Extremity;  Surgeon: [name], MD;  Location: [place] [facility];  Service: Cardiovascular;  Laterality: N/A;   PERIPHERAL VASCULAR CATHETERIZATION Right 02/10/2016   Procedure: Lower Extremity Angiography;  Surgeon: [name], MD;  Location: [place] [facility];  Service: Cardiovascular;  Laterality: Right;   POLYPECTOMY  11/05/2017   Procedure: POLYPECTOMY;  Surgeon: Lucilla Lame, MD;  Location: [place];  Service: Endoscopy;;   SHOULDER ARTHROSCOPY WITH ROTATOR CUFF REPAIR AND  SUBACROMIAL DECOMPRESSION Right 02/27/2020   Procedure: RIGHT SHOULDER ARTHROSCOPY SUBACROMIAL DECOMPRESSION, DISTAL CLAVICLE EXCISION AND MINI-OPEN ROTATOR CUFF REPAIR;  Surgeon: Thornton Park, MD;  Location: [facility];  Service: Orthopedics;  Laterality: Right;   TONSILLECTOMY     VASCULAR SURGERY  [id number]   Fem-Pop Bypass    There were no vitals filed for this visit.   Subjective Assessment - 04/22/21 1033     Subjective Pt states she is doing well today. She denies any resting pain upon arrival today.  Shoulder is doing alright today. No falls since last therapy session. No specific questions currently.    Pertinent History Pt has a history of RLE vascular issues since 1991. 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 She received her RLE prosthetic in April 2022 and has been receiving [place] [facility] since her surgery. They discharged her yesterday to continue with OP PT. She continues with phantom RLE pain, especially at night and takes gabapentin with minimal relief. She has also been having R shoulder pain since the surgery which she attributes to using a wheelchair. She underwent R RTC surgery in September of 2021 and had physical therapy afterwards. She has been having issues with bilateral carpal tunnel and has a NCV study scheduled for tomorrow.    Limitations Walking    Patient Stated Goals Ambulate with her prosthetic               TREATMENT   Ther-ex  With prosthetic donned: Standing marches with 3# ankle weights (AW) x 10 BLE; Standing hip abduction with 3# AW x 10 BLE; Seated hip flexion marching with 3# AW x 10 BLE; 6" step-ups with BUE support leading with LLE, down with RLE x 10; Intermittent seated rest breaks  Neuromuscular Re-education  With prosthetic donned:  Forward, backward, and side stepping in // bars without UE support and CGA from therapist x 4 lengths each direction; Obstable course in // bars without UE support  stepping over 3" foam roll, 6" hurdle, and ascend/descend 6" step x 4; Airex feet apart eyes open/closed x 30s each; Airex feet apart with horizontal and vertical head turns x 30s each; Airex feet together eyes open/closed x 30s each; Airex feet together with horizontal and vertical head turns x 30s each;   Pt educated throughout session about proper posture and technique with exercises. Improved exercise technique, movement at target joints, use of target muscles after min to mod verbal, visual, tactile cues.     Patient demonstrates excellent motivation during session today.  Session today focused on standing activities in parallel bars for both strength and balance.  Initiated hip flexion marches and abduction with ankle weights.  Also performed 6 inch step ups without upper extremity support.  Obstacle course utilized in parallel bars to challenge right single-leg stability and balance. No HEP progression today. Patient encouraged to follow-up as scheduled. Pt will benefit from PT services to address deficits in strength, balance, and mobility in order to return to full function at  home.                           PT Short Term Goals - 04/20/21 1021       PT SHORT TERM GOAL #1   Title Pt will be independent with HEP in order to improve strength and balance in order to decrease fall risk and improve function at home.    Time 6    Period Weeks    Status On-going    Target Date 06/01/21               PT Long Term Goals - 04/20/21 1021       PT LONG TERM GOAL #1   Title Pt will improve BERG to at least 52/56 in order to demonstrate clinically significant improvement in balance and decrease risk for falls;    Baseline 01/26/21: 45/56; 03/02/21: 47/56; 04/20/21: 49/56    Time 12    Period Weeks    Status Partially Met    Target Date 07/13/21      PT LONG TERM GOAL #2   Title Pt will improve ABC by at least 13% in order to demonstrate clinically significant  improvement in balance confidence.    Baseline 01/26/21: 57.5%; 03/02/21: 89.4%    Time 8    Period Weeks    Status Achieved    Target Date 07/13/21      PT LONG TERM GOAL #3   Title Pt will improve FOTO to at least 63 in order to demonstrate clinically significant improvement in function related to her RLE amputation    Baseline 01/26/21: 59; 03/02/21: 71; 04/06/21: 71    Time 8    Period Weeks    Status Achieved      PT LONG TERM GOAL #4   Title Pt will increase self-selected 10MWT to at least 0.6 m/s in order to demonstrate clinically significant improvement in community ambulation.    Baseline 01/26/21: 31.5s = 0.32 m/s; 03/02/21: 25.5s = 0.39 m/s; 04/06/21: 20.7s = 0.48 m/s    Time 8    Period Weeks    Status Partially Met    Target Date 07/13/21                   Plan - 04/22/21 1306     Clinical Impression Statement Patient demonstrates excellent motivation during session today.  Session today focused on standing activities in parallel bars for both strength and balance.  Initiated hip flexion marches and abduction with ankle weights.  Also performed 6 inch step ups without upper extremity support.  Obstacle course utilized in parallel bars to challenge right single-leg stability and balance. No HEP progression today. Patient encouraged to follow-up as scheduled. Pt will benefit from PT services to address deficits in strength, balance, and mobility in order to return to full function at home.    Personal Factors and Comorbidities Age;Comorbidity 3+    Comorbidities Depression, osteoporosis, vascular disease    Examination-Activity Limitations Bathing;Locomotion Level;Squat;Stairs;Stand;Transfers    Examination-Participation Restrictions Cleaning;Community Activity;Meal Prep;Yard Work    Stability/Clinical Decision Making Unstable/Unpredictable    Rehab Potential Good    PT Frequency 2x / week    PT Duration 12 weeks    PT Treatment/Interventions ADLs/Self Care Home  Management;Aquatic Therapy;Canalith Repostioning;Cryotherapy;Electrical Stimulation;Iontophoresis 22m/ml Dexamethasone;Moist Heat;Traction;Ultrasound;Gait training;Stair training;Therapeutic activities;Therapeutic exercise;Neuromuscular re-education;Manual techniques;Dry needling;Vestibular;Spinal Manipulations;Joint Manipulations    PT Next Visit Plan strengthening, practice gait training with lofstrand crutches    PT  Home Exercise Plan Access Code: [email or web address]    Consulted and Agree with Plan of Care Patient                Patient will benefit from skilled therapeutic intervention in order to improve the following deficits and impairments:  Abnormal gait, Decreased strength, Difficulty walking  Visit Diagnosis: Muscle weakness (generalized)  Difficulty in walking, not elsewhere classified  Unsteadiness on feet     Problem List Patient Active Problem List   Diagnosis Date Noted   Muscle spasm 09/09/2020   S/P AKA (above knee amputation), right ([place]) 09/08/2020   Ischemia of extremity 07/02/2020   Chronic pain in right shoulder 11/25/2019   Encounter for long-term (current) use of high-risk medication 07/16/2019   GCA (giant cell arteritis) ([place]) 07/16/2019   Temporal arteritis ([place]) 07/13/2019   Postoperative wound infection 06/23/2019   Ischemic leg 05/31/2019   Atherosclerosis of native arteries of extremity with intermittent claudication ([place]) 05/30/2019   Depression 05/29/2019   Eczema of lower extremity 03/04/2018   Chronic venous insufficiency 03/02/2018   Personal history of colonic polyps    Family history of colonic polyps    Benign neoplasm of ascending colon    Mitral valve insufficiency 02/08/2017   Dyspnea on exertion 02/07/2017   Non-rheumatic mitral regurgitation 02/07/2017   Precordial pain 02/07/2017   CAD (coronary artery disease) 12/21/2016   Essential hypertension 12/21/2016   Compression fracture of lumbar spine, non-traumatic ([place]) 04/26/2016    Compression fracture of L3 lumbar vertebra 04/20/2016   PVD (peripheral vascular disease) ([place]) 02/24/2016   Family history of premature CAD 02/24/2016   Gastritis    Other specified diseases of esophagus    Hiatal hernia    Gastritis and gastroduodenitis    Ischemia of lower extremity    Arterial occlusion ([facility])    Atherosclerosis of aorta ([place])    History of smoking    Hyperlipidemia    Pain in the chest    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 02/05/2016   Carotid artery stenosis 01/04/2016   Vertigo, benign paroxysmal 12/28/2015   Clinical depression 09/06/2015   History of alcoholism ([place]) 09/06/2015   Angiopathy, 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[place]) 09/06/2015   Atherosclerosis of native arteries of extremity with rest pain ([place]) 09/06/2015   Acute non-recurrent maxillary sinusitis 07/14/2015   Vaginal pruritus 05/26/2015   Chronic recurrent major depressive disorder ([place]) 03/09/2015   Osteoporosis, post-menopausal 03/09/2015   Peripheral blood vessel disorder ([place]) 03/09/2015   Acid reflux 03/09/2015   GERD (gastroesophageal reflux disease) 12/21/2014   Carotid artery narrowing 01/28/2014   Peripheral arterial occlusive disease ([place]) 06/08/2011   Occlusion and stenosis of unspecified carotid artery 06/08/2011   PAD (peripheral artery disease) ([place]) 06/08/2011   Lyndel Safe Shatasia Cutshaw PT, DPT, GCS  Briton Sellman 04/22/2021, 1:16 PM  [place] [facility] [address]. [place], [place], [address] Phone: [phone]   Fax:  [phone]  Name: GILBERTA PEETERS MRN: [id number] Date of Birth: 11-16-1950

## 2021-04-27 ENCOUNTER — Ambulatory Visit: Payer: Medicare Other

## 2021-04-27 ENCOUNTER — Other Ambulatory Visit: Payer: Self-pay

## 2021-04-27 DIAGNOSIS — R2681 Unsteadiness on feet: Secondary | ICD-10-CM

## 2021-04-27 DIAGNOSIS — M6281 Muscle weakness (generalized): Secondary | ICD-10-CM

## 2021-04-27 DIAGNOSIS — R262 Difficulty in walking, not elsewhere classified: Secondary | ICD-10-CM

## 2021-04-27 NOTE — Therapy (Signed)
[facility] [address] [place], [place], [address] Phone: [phone]   Fax:  [phone]  Physical Therapy Treatment  Patient Details  Name: Sara [name] MRN: [id number] Date of Birth: [birth year] Referring Provider (PT): Dr. Delana Meyer   Encounter Date: 04/27/2021   PT End of Session - 04/27/21 1350     Visit Number 26    Number of Visits 46    Date for PT Re-Evaluation 07/13/21    Authorization Type eval: 01/26/21    PT Start Time 1245    PT Stop Time 1330    PT Time Calculation (min) 45 min    Equipment Utilized During Treatment Gait belt    Activity Tolerance Patient tolerated treatment well    Behavior During Therapy [facility] for tasks assessed/performed             Past Medical History:  Diagnosis Date   Arthritis    right shoulder   Depression    Dyspnea    GERD (gastroesophageal reflux disease)    History of blood clots    Hyperlipidemia    Hypertension    Mild mitral regurgitation    Osteoporosis    Peripheral vascular disease ([place])    Stomach ulcer    Vascular disease    Sees Dr. Delana Meyer   Vertigo    Last episode approx Aug 2015   Wears dentures    full upper    Past Surgical History:  Procedure Laterality Date   ADENOIDECTOMY     AMPUTATION Right 08/11/2020   Procedure: AMPUTATION ABOVE KNEE;  Surgeon: [name], MD;  Location: [facility];  Service: Vascular;  Laterality: Right;   ARTERY BIOPSY Right 07/14/2019   Procedure: BIOPSY TEMPORAL ARTERY;  Surgeon: [name], MD;  Location: [facility];  Service: Vascular;  Laterality: Right;   BREAST CYST ASPIRATION Left    CARDIAC CATHETERIZATION  02/15/2017   [facility]   COLONOSCOPY WITH PROPOFOL N/A 10/22/2017   Procedure: COLONOSCOPY WITH PROPOFOL;  Surgeon: Lucilla Lame, MD;  Location: [place];  Service: Endoscopy;  Laterality: N/A;  specimens not taken--pt on Plavix will be brought back in after 7 days off med   COLONOSCOPY WITH  PROPOFOL N/A 11/05/2017   Procedure: COLONOSCOPY WITH PROPOFOL;  Surgeon: Lucilla Lame, MD;  Location: [place];  Service: Endoscopy;  Laterality: N/A;   ESOPHAGOGASTRODUODENOSCOPY N/A 12/21/2014   Procedure: ESOPHAGOGASTRODUODENOSCOPY (EGD);  Surgeon: Lucilla Lame, MD;  Location: [place];  Service: Gastroenterology;  Laterality: N/A;   ESOPHAGOGASTRODUODENOSCOPY (EGD) WITH PROPOFOL N/A 02/08/2016   Procedure: ESOPHAGOGASTRODUODENOSCOPY (EGD) WITH PROPOFOL;  Surgeon: Lucilla Lame, MD;  Location: [facility];  Service: Endoscopy;  Laterality: N/A;   FASCIOTOMY Right 05/30/2019   Procedure: FASCIOTOMY;  Surgeon: [name], MD;  Location: [facility];  Service: Vascular;  Laterality: Right;   FASCIOTOMY CLOSURE Right 06/04/2019   Procedure: FASCIOTOMY CLOSURE;  Surgeon: [name], MD;  Location: [facility];  Service: Vascular;  Laterality: Right;   HEMORROIDECTOMY  2014   LOWER EXTREMITY ANGIOGRAPHY Right 05/30/2019   Procedure: LOWER EXTREMITY ANGIOGRAPHY;  Surgeon: [name], MD;  Location: [place] [facility];  Service: Cardiovascular;  Laterality: Right;   LOWER EXTREMITY ANGIOGRAPHY Right 07/02/2020   Procedure: LOWER EXTREMITY ANGIOGRAPHY;  Surgeon: [name], MD;  Location: [place] [facility];  Service: Cardiovascular;  Laterality: Right;   PERIPHERAL VASCULAR CATHETERIZATION N/A 02/09/2016   Procedure: Abdominal Aortogram w/Lower Extremity;  Surgeon: Belenda Cruise  Eloise Levels, MD;  Location: [place] [facility];  Service: Cardiovascular;  Laterality: N/A;   PERIPHERAL VASCULAR CATHETERIZATION Right 02/10/2016   Procedure: Lower Extremity Angiography;  Surgeon: [name], MD;  Location: [place] [facility];  Service: Cardiovascular;  Laterality: Right;   POLYPECTOMY  11/05/2017   Procedure: POLYPECTOMY;  Surgeon: Lucilla Lame, MD;  Location: [place];  Service: Endoscopy;;   SHOULDER ARTHROSCOPY WITH ROTATOR CUFF REPAIR AND  SUBACROMIAL DECOMPRESSION Right 02/27/2020   Procedure: RIGHT SHOULDER ARTHROSCOPY SUBACROMIAL DECOMPRESSION, DISTAL CLAVICLE EXCISION AND MINI-OPEN ROTATOR CUFF REPAIR;  Surgeon: Thornton Park, MD;  Location: [facility];  Service: Orthopedics;  Laterality: Right;   TONSILLECTOMY     VASCULAR SURGERY  [id number]   Fem-Pop Bypass    There were no vitals filed for this visit.   Subjective Assessment - 04/27/21 1349     Subjective Pt states that she is slightly more fatigue today compared to normal with increased gait activity the last few days to attend doctor visits. Pt denies pain, falls, or near fall from the last tx.    Pertinent History Pt has a history of RLE vascular issues since 1991. 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 She received her RLE prosthetic in April 2022 and has been receiving [facility] since her surgery. They discharged her yesterday to continue with OP PT. She continues with phantom RLE pain, especially at night and takes gabapentin with minimal relief. She has also been having R shoulder pain since the surgery which she attributes to using a wheelchair. She underwent R RTC surgery in September of 2021 and had physical therapy afterwards. She has been having issues with bilateral carpal tunnel and has a NCV study scheduled for tomorrow.    Limitations Walking    Patient Stated Goals Ambulate with her prosthetic    Currently in Pain? No/denies              TREATMENT     Ther-ex  With prosthetic donned:  30 ft amb with therapist CGA without AD. Pt requires occ min A to prevent falls without AD. Pt tends displayed decreased balance during duel task via conversation.   // bars with CGA- min A to prevention occ loss of balance. Each exercise were complete 3 laps with seated rest breaks encourage before complete fatigue 1) 1 side lateral toe taps, bilat UE 1st lap, Contral UE 2nd lap, and no UE 3rd lap  2) lateral stepping on long  airex  lap with light UE assist 3) marching x15 each leg with 3# ankle weights with 66f amb walk before and after to facilitate endurance training.    Airex static balance with CGA-Min A and verbal cueing for occ touch assist for balance stability. 1 min each position  1) standard stance  2) romberg 3) Sharpen romberg   Prone push push 8x10 secs to facilitate bilat hip flexor mobility.    Pt tolerated treatment very well with all therex. Pt display decreased stability and increased therapist assist when tasks involved sls balance on RUE without UE assist. Pt continues to need bilat Lofstrand crutchs for safe independent mobility. Pt continues to display mark LE endurance, strength, and ROM deficits resulting in decreased safe home/community mobiity, increased pain, and limited access to QOL. Pt. will continue to benefit from skilled physical therapy to progress POC to address remaining deficits to facilitate maximum functional capacity for optimal personal health and wellness for ADLs.  Pt educated throughout session about proper posture and technique with exercises. Improved exercise technique, movement at target joints, use of target muscles after min to mod verbal, visual, tactile cues.         PT Short Term Goals - 04/20/21 1021       PT SHORT TERM GOAL #1   Title Pt will be independent with HEP in order to improve strength and balance in order to decrease fall risk and improve function at home.    Time 6    Period Weeks    Status On-going    Target Date 06/01/21               PT Long Term Goals - 04/20/21 1021       PT LONG TERM GOAL #1   Title Pt will improve BERG to at least 52/56 in order to demonstrate clinically significant improvement in balance and decrease risk for falls;    Baseline 01/26/21: 45/56; 03/02/21: 47/56; 04/20/21: 49/56    Time 12    Period Weeks    Status Partially Met    Target Date 07/13/21      PT LONG TERM GOAL #2   Title Pt  will improve ABC by at least 13% in order to demonstrate clinically significant improvement in balance confidence.    Baseline 01/26/21: 57.5%; 03/02/21: 89.4%    Time 8    Period Weeks    Status Achieved    Target Date 07/13/21      PT LONG TERM GOAL #3   Title Pt will improve FOTO to at least 63 in order to demonstrate clinically significant improvement in function related to her RLE amputation    Baseline 01/26/21: 59; 03/02/21: 71; 04/06/21: 71    Time 8    Period Weeks    Status Achieved      PT LONG TERM GOAL #4   Title Pt will increase self-selected 10MWT to at least 0.6 m/s in order to demonstrate clinically significant improvement in community ambulation.    Baseline 01/26/21: 31.5s = 0.32 m/s; 03/02/21: 25.5s = 0.39 m/s; 04/06/21: 20.7s = 0.48 m/s    Time 8    Period Weeks    Status Partially Met    Target Date 07/13/21                   Plan - 04/27/21 1351     Personal Factors and Comorbidities Age;Comorbidity 3+    Comorbidities Depression, osteoporosis, vascular disease    Examination-Activity Limitations Bathing;Locomotion Level;Squat;Stairs;Stand;Transfers    Examination-Participation Restrictions Cleaning;Community Activity;Meal Prep;Yard Work    Stability/Clinical Decision Making Unstable/Unpredictable    Surveyor, mining    Rehab Potential Good    PT Frequency 2x / week    PT Duration 12 weeks    PT Treatment/Interventions ADLs/Self Care Home Management;Aquatic Therapy;Canalith Repostioning;Cryotherapy;Electrical Stimulation;Iontophoresis 81m/ml Dexamethasone;Moist Heat;Traction;Ultrasound;Gait training;Stair training;Therapeutic activities;Therapeutic exercise;Neuromuscular re-education;Manual techniques;Dry needling;Vestibular;Spinal Manipulations;Joint Manipulations    PT Next Visit Plan strengthening, practice gait training with lofstrand crutches    PT Home Exercise Plan Access Code: [email or web address]    Consulted and Agree with Plan of Care Patient              Patient will benefit from skilled therapeutic intervention in order to improve the following deficits and impairments:  Abnormal gait, Decreased strength, Difficulty walking  Visit Diagnosis: Muscle weakness (generalized)  Difficulty in walking, not elsewhere classified  Unsteadiness on feet     Problem List Patient Active Problem  List   Diagnosis Date Noted   Muscle spasm 09/09/2020   S/P AKA (above knee amputation), right ([place]) 09/08/2020   Ischemia of extremity 07/02/2020   Chronic pain in right shoulder 11/25/2019   Encounter for long-term (current) use of high-risk medication 07/16/2019   GCA (giant cell arteritis) ([place]) 07/16/2019   Temporal arteritis ([place]) 07/13/2019   Postoperative wound infection 06/23/2019   Ischemic leg 05/31/2019   Atherosclerosis of native arteries of extremity with intermittent claudication ([place]) 05/30/2019   Depression 05/29/2019   Eczema of lower extremity 03/04/2018   Chronic venous insufficiency 03/02/2018   Personal history of colonic polyps    Family history of colonic polyps    Benign neoplasm of ascending colon    Mitral valve insufficiency 02/08/2017   Dyspnea on exertion 02/07/2017   Non-rheumatic mitral regurgitation 02/07/2017   Precordial pain 02/07/2017   CAD (coronary artery disease) 12/21/2016   Essential hypertension 12/21/2016   Compression fracture of lumbar spine, non-traumatic ([place]) 04/26/2016   Compression fracture of L3 lumbar vertebra 04/20/2016   PVD (peripheral vascular disease) ([place]) 02/24/2016   Family history of premature CAD 02/24/2016   Gastritis    Other specified diseases of esophagus    Hiatal hernia    Gastritis and gastroduodenitis    Ischemia of lower extremity    Arterial occlusion ([facility])    Atherosclerosis of aorta ([facility])    History of smoking    Hyperlipidemia    Pain in the chest    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 02/05/2016   Carotid artery  stenosis 01/04/2016   Vertigo, benign paroxysmal 12/28/2015   Clinical depression 09/06/2015   History of alcoholism ([place]) 09/06/2015   Angiopathy, 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[place]) 09/06/2015   Atherosclerosis of native arteries of extremity with rest pain ([place]) 09/06/2015   Acute non-recurrent maxillary sinusitis 07/14/2015   Vaginal pruritus 05/26/2015   Chronic recurrent major depressive disorder ([place]) 03/09/2015   Osteoporosis, post-menopausal 03/09/2015   Peripheral blood vessel disorder ([place]) 03/09/2015   Acid reflux 03/09/2015   GERD (gastroesophageal reflux disease) 12/21/2014   Carotid artery narrowing 01/28/2014   Peripheral arterial occlusive disease ([place]) 06/08/2011   Occlusion and stenosis of unspecified carotid artery 06/08/2011   PAD (peripheral artery disease) ([place]) 06/08/2011    This entire session was performed under direct supervision and direction of a licensed therapist/therapist assistant . I have personally read, edited and approve of the note as written.   Phillips Grout PT, DPT, GCS  Fara Olden, Student-PT 04/27/2021, 2:59 PM  [facility] [address] [place], [place], [address] Phone: [phone]   Fax:  [phone]  Name: Sara [name] MRN: [id number] Date of Birth: [birth year]

## 2021-04-29 ENCOUNTER — Other Ambulatory Visit: Payer: Self-pay

## 2021-04-29 ENCOUNTER — Ambulatory Visit: Payer: Medicare Other

## 2021-04-29 VITALS — BP 148/80 | HR 92

## 2021-04-29 DIAGNOSIS — M6281 Muscle weakness (generalized): Secondary | ICD-10-CM

## 2021-04-29 DIAGNOSIS — G546 Phantom limb syndrome with pain: Secondary | ICD-10-CM

## 2021-04-29 DIAGNOSIS — R262 Difficulty in walking, not elsewhere classified: Secondary | ICD-10-CM

## 2021-04-29 NOTE — Therapy (Signed)
[facility] [address] [place], [place], [address] Phone: [phone]   Fax:  [phone]  Physical Therapy Treatment  Patient Details  Name: Sara [name] MRN: [id number] Date of Birth: [birth year] Referring Provider (PT): Dr. Delana Meyer   Encounter Date: 04/29/2021   PT End of Session - 04/29/21 1020     Visit Number 27    Number of Visits 56    Date for PT Re-Evaluation 07/13/21    Authorization Type eval: 01/26/21    PT Start Time 0945    PT Stop Time 1030    PT Time Calculation (min) 45 min    Equipment Utilized During Treatment Gait belt    Activity Tolerance Patient tolerated treatment well    Behavior During Therapy [facility] for tasks assessed/performed               Past Medical History:  Diagnosis Date   Arthritis    right shoulder   Depression    Dyspnea    GERD (gastroesophageal reflux disease)    History of blood clots    Hyperlipidemia    Hypertension    Mild mitral regurgitation    Osteoporosis    Peripheral vascular disease ([place])    Stomach ulcer    Vascular disease    Sees Dr. Delana Meyer   Vertigo    Last episode approx Aug 2015   Wears dentures    full upper    Past Surgical History:  Procedure Laterality Date   ADENOIDECTOMY     AMPUTATION Right 08/11/2020   Procedure: AMPUTATION ABOVE KNEE;  Surgeon: [name], MD;  Location: [facility];  Service: Vascular;  Laterality: Right;   ARTERY BIOPSY Right 07/14/2019   Procedure: BIOPSY TEMPORAL ARTERY;  Surgeon: [name], MD;  Location: [facility];  Service: Vascular;  Laterality: Right;   BREAST CYST ASPIRATION Left    CARDIAC CATHETERIZATION  02/15/2017   [facility]   COLONOSCOPY WITH PROPOFOL N/A 10/22/2017   Procedure: COLONOSCOPY WITH PROPOFOL;  Surgeon: Lucilla Lame, MD;  Location: [place];  Service: Endoscopy;  Laterality: N/A;  specimens not taken--pt on Plavix will be brought back in after 7 days off med   COLONOSCOPY WITH  PROPOFOL N/A 11/05/2017   Procedure: COLONOSCOPY WITH PROPOFOL;  Surgeon: Lucilla Lame, MD;  Location: [place];  Service: Endoscopy;  Laterality: N/A;   ESOPHAGOGASTRODUODENOSCOPY N/A 12/21/2014   Procedure: ESOPHAGOGASTRODUODENOSCOPY (EGD);  Surgeon: Lucilla Lame, MD;  Location: [place];  Service: Gastroenterology;  Laterality: N/A;   ESOPHAGOGASTRODUODENOSCOPY (EGD) WITH PROPOFOL N/A 02/08/2016   Procedure: ESOPHAGOGASTRODUODENOSCOPY (EGD) WITH PROPOFOL;  Surgeon: Lucilla Lame, MD;  Location: [facility];  Service: Endoscopy;  Laterality: N/A;   FASCIOTOMY Right 05/30/2019   Procedure: FASCIOTOMY;  Surgeon: [name], MD;  Location: [facility];  Service: Vascular;  Laterality: Right;   FASCIOTOMY CLOSURE Right 06/04/2019   Procedure: FASCIOTOMY CLOSURE;  Surgeon: [name], MD;  Location: [facility];  Service: Vascular;  Laterality: Right;   HEMORROIDECTOMY  2014   LOWER EXTREMITY ANGIOGRAPHY Right 05/30/2019   Procedure: LOWER EXTREMITY ANGIOGRAPHY;  Surgeon: [name], MD;  Location: [place] [facility];  Service: Cardiovascular;  Laterality: Right;   LOWER EXTREMITY ANGIOGRAPHY Right 07/02/2020   Procedure: LOWER EXTREMITY ANGIOGRAPHY;  Surgeon: [name], MD;  Location: [place] [facility];  Service: Cardiovascular;  Laterality: Right;   PERIPHERAL VASCULAR CATHETERIZATION N/A 02/09/2016   Procedure: Abdominal Aortogram w/Lower Extremity;  Surgeon: [name], MD;  Location: [place] [facility];  Service: Cardiovascular;  Laterality: N/A;   PERIPHERAL VASCULAR CATHETERIZATION Right 02/10/2016   Procedure: Lower Extremity Angiography;  Surgeon: [name], MD;  Location: [place] [facility];  Service: Cardiovascular;  Laterality: Right;   POLYPECTOMY  11/05/2017   Procedure: POLYPECTOMY;  Surgeon: Lucilla Lame, MD;  Location: [place];  Service: Endoscopy;;   SHOULDER ARTHROSCOPY WITH ROTATOR CUFF REPAIR AND  SUBACROMIAL DECOMPRESSION Right 02/27/2020   Procedure: RIGHT SHOULDER ARTHROSCOPY SUBACROMIAL DECOMPRESSION, DISTAL CLAVICLE EXCISION AND MINI-OPEN ROTATOR CUFF REPAIR;  Surgeon: Thornton Park, MD;  Location: [facility];  Service: Orthopedics;  Laterality: Right;   TONSILLECTOMY     VASCULAR SURGERY  1991,2012   Fem-Pop Bypass    Vitals:   04/29/21 0953  BP: (!) 148/80  Pulse: 92  SpO2: 96%     Subjective Assessment - 04/29/21 1011     Subjective Pt states that she is not feeling well today. She is having a lot of stomach pain upon arrival which she rates as 5/10. This is a chronic issue for patient since she was younger. She denies any nausea, vomiting, fever, or chills. Last night she had 10/10 phantom RLE pain which prevented her from sleeping well. Phantom limb pain is a 7/10 in RLE currently. She would like to know if there is anything she can do to help with the phantom limb pain. Pt denies pain, falls, or near fall from the last tx.    Pertinent History Pt has a history of RLE vascular issues since 1991. 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 She received her RLE prosthetic in April 2022 and has been receiving [facility] since her surgery. They discharged her yesterday to continue with OP PT. She continues with phantom RLE pain, especially at night and takes gabapentin with minimal relief. She has also been having R shoulder pain since the surgery which she attributes to using a wheelchair. She underwent R RTC surgery in September of 2021 and had physical therapy afterwards. She has been having issues with bilateral carpal tunnel and has a NCV study scheduled for tomorrow.    Limitations Walking    Patient Stated Goals Ambulate with her prosthetic               TREATMENT   Ther-ex  With prosthetic doffed: Supine R SLR hip flexion with manual resistance from therapist 2 x 20; Supine RLE long axis manually resisted external and internal  rotation 2 x 20 each; Supine RLE adduction with manual resistance 2 x 20; L sidelying R hip abduction with manual resistance 2 x 20; L sidelying R hip extension from 90 hip flexion to available end range extension with manual resistance 2 x 20; Left side-lying manual right hip flexor stretch 2 x 30 seconds;   Neuromuscular Re-education  Mirror therapy performed with patient sitting at edge of mat table and mirror between legs to simulate right lower extremity.  Patient performed seated long arc quads and seated hip flexion marches x 3 minutes; Education performed with patient regarding mirror therapy for phantom limb pain;   Electrical Stimulation  High rate modulated tens applied with Empi Continnum Unit to end of residual right lower extremity on default knee setting with small pads at pt tolerated intensity of 5.5 x 10 minutes.    Pt educated throughout session about proper posture and technique with exercises. Improved exercise technique, movement at target  joints, use of target muscles after min to mod verbal, visual, tactile cues.      Patient demonstrates excellent motivation during session today.  She reports a high level of family in pain last night into today.  Extensive education performed patient regarding techniques to modulate phantom limb pain including mirror therapy which is performed with patient during session today.  Also discussed use of TENS, massage, and medication management as recommended by her physician.  Continued mat table strengthening during session today however deferred any standing or balance exercises due to patient arriving with considerable stomach pain which is a chronic issue for her. Patient encouraged to continue HEP and follow-up as scheduled.  She will benefit from PT services to address deficits in strength, balance, and mobility in order to return to full function at home.                            PT Short Term Goals -  04/20/21 1021       PT SHORT TERM GOAL #1   Title Pt will be independent with HEP in order to improve strength and balance in order to decrease fall risk and improve function at home.    Time 6    Period Weeks    Status On-going    Target Date 06/01/21               PT Long Term Goals - 04/20/21 1021       PT LONG TERM GOAL #1   Title Pt will improve BERG to at least 52/56 in order to demonstrate clinically significant improvement in balance and decrease risk for falls;    Baseline 01/26/21: 45/56; 03/02/21: 47/56; 04/20/21: 49/56    Time 12    Period Weeks    Status Partially Met    Target Date 07/13/21      PT LONG TERM GOAL #2   Title Pt will improve ABC by at least 13% in order to demonstrate clinically significant improvement in balance confidence.    Baseline 01/26/21: 57.5%; 03/02/21: 89.4%    Time 8    Period Weeks    Status Achieved    Target Date 07/13/21      PT LONG TERM GOAL #3   Title Pt will improve FOTO to at least 63 in order to demonstrate clinically significant improvement in function related to her RLE amputation    Baseline 01/26/21: 59; 03/02/21: 71; 04/06/21: 71    Time 8    Period Weeks    Status Achieved      PT LONG TERM GOAL #4   Title Pt will increase self-selected 10MWT to at least 0.6 m/s in order to demonstrate clinically significant improvement in community ambulation.    Baseline 01/26/21: 31.5s = 0.32 m/s; 03/02/21: 25.5s = 0.39 m/s; 04/06/21: 20.7s = 0.48 m/s    Time 8    Period Weeks    Status Partially Met    Target Date 07/13/21                   Plan - 04/29/21 1020     Personal Factors and Comorbidities Age;Comorbidity 3+    Comorbidities Depression, osteoporosis, vascular disease    Examination-Activity Limitations Bathing;Locomotion Level;Squat;Stairs;Stand;Transfers    Examination-Participation Restrictions Cleaning;Community Activity;Meal Prep;Yard Work    Stability/Clinical Decision Making Unstable/Unpredictable     Rehab Potential Good    PT Frequency 2x / week    PT Duration  12 weeks    PT Treatment/Interventions ADLs/Self Care Home Management;Aquatic Therapy;Canalith Repostioning;Cryotherapy;Electrical Stimulation;Iontophoresis 20m/ml Dexamethasone;Moist Heat;Traction;Ultrasound;Gait training;Stair training;Therapeutic activities;Therapeutic exercise;Neuromuscular re-education;Manual techniques;Dry needling;Vestibular;Spinal Manipulations;Joint Manipulations    PT Next Visit Plan strengthening, practice gait training with lofstrand crutches    PT Home Exercise Plan Access Code: [email or web address]    Consulted and Agree with Plan of Care Patient               Patient will benefit from skilled therapeutic intervention in order to improve the following deficits and impairments:  Abnormal gait, Decreased strength, Difficulty walking  Visit Diagnosis: Muscle weakness (generalized)  Difficulty in walking, not elsewhere classified  Phantom limb syndrome with pain ([facility]     Problem List Patient Active Problem List   Diagnosis Date Noted   Muscle spasm 09/09/2020   S/P AKA (above knee amputation), right ([facility] 09/08/2020   Ischemia of extremity 07/02/2020   Chronic pain in right shoulder 11/25/2019   Encounter for long-term (current) use of high-risk medication 07/16/2019   GCA (giant cell arteritis) ([facility] 07/16/2019   Temporal arteritis ([place] 07/13/2019   Postoperative wound infection 06/23/2019   Ischemic leg 05/31/2019   Atherosclerosis of native arteries of extremity with intermittent claudication ([facility] 05/30/2019   Depression 05/29/2019   Eczema of lower extremity 03/04/2018   Chronic venous insufficiency 03/02/2018   Personal history of colonic polyps    Family history of colonic polyps    Benign neoplasm of ascending colon    Mitral valve insufficiency 02/08/2017   Dyspnea on exertion 02/07/2017   Non-rheumatic mitral regurgitation 02/07/2017   Precordial pain 02/07/2017   CAD (coronary  artery disease) 12/21/2016   Essential hypertension 12/21/2016   Compression fracture of lumbar spine, non-traumatic ([place] 04/26/2016   Compression fracture of L3 lumbar vertebra 04/20/2016   PVD (peripheral vascular disease) ([facility] [facility] 02/24/2016   Family history of premature CAD 02/24/2016   Gastritis    Other specified diseases of esophagus    Hiatal hernia    Gastritis and gastroduodenitis    Ischemia of lower extremity    Arterial occlusion (HCC)    Atherosclerosis of aorta ([facility]    History of smoking    Hyperlipidemia    Pain in the chest    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 02/05/2016   Carotid artery stenosis 01/04/2016   Vertigo, benign paroxysmal 12/28/2015   Clinical depression 09/06/2015   History of alcoholism ([facility] 09/06/2015   Angiopathy, 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[facility] 09/06/2015   Atherosclerosis of native arteries of extremity with rest pain ([facility] 09/06/2015   Acute non-recurrent maxillary sinusitis 07/14/2015   Vaginal pruritus 05/26/2015   Chronic recurrent major depressive disorder ([facility] 03/09/2015   Osteoporosis, post-menopausal 03/09/2015   Peripheral blood vessel disorder ([facility] 03/09/2015   Acid reflux 03/09/2015   GERD (gastroesophageal reflux disease) 12/21/2014   Carotid artery narrowing 01/28/2014   Peripheral arterial occlusive disease ([facility] 06/08/2011   Occlusion and stenosis of unspecified carotid artery 06/08/2011   PAD (peripheral artery disease) ([facility] 06/08/2011   JLyndel SafeHuprich PT, DPT, GCS  Santana Gosdin 04/29/2021, 10:54 AM  [facility] [place] [address]Phone: [phone]  Fax:  [phone] Name: Sara TAYSMRN: [id number] of Birth: 2April 19, 1952

## 2021-05-04 ENCOUNTER — Other Ambulatory Visit: Payer: Self-pay

## 2021-05-04 ENCOUNTER — Ambulatory Visit: Payer: Medicare Other

## 2021-05-04 DIAGNOSIS — M6281 Muscle weakness (generalized): Secondary | ICD-10-CM

## 2021-05-04 DIAGNOSIS — R262 Difficulty in walking, not elsewhere classified: Secondary | ICD-10-CM

## 2021-05-04 NOTE — Therapy (Addendum)
[facility], [place] [address]. [place], [place], [address] Phone: [phone]   Fax:  [phone]  Physical Therapy Treatment  Patient Details  Name: KAYTLEN LIGHTSEY MRN: [id number] Date of Birth: 04-06-1951 Referring Provider (PT): Dr. Delana Meyer   Encounter Date: 05/04/2021   PT End of Session - 05/04/21 1021     Visit Number 28    Number of Visits 4    Date for PT Re-Evaluation 07/13/21    Authorization Type eval: 01/26/21    PT Start Time 1015    PT Stop Time 1100    PT Time Calculation (min) 45 min    Equipment Utilized During Treatment Gait belt    Activity Tolerance Patient tolerated treatment well    Behavior During Therapy [facility] for tasks assessed/performed               Past Medical History:  Diagnosis Date   Arthritis    right shoulder   Depression    Dyspnea    GERD (gastroesophageal reflux disease)    History of blood clots    Hyperlipidemia    Hypertension    Mild mitral regurgitation    Osteoporosis    Peripheral vascular disease ([place])    Stomach ulcer    Vascular disease    Sees Dr. Delana Meyer   Vertigo    Last episode approx Aug 2015   Wears dentures    full upper    Past Surgical History:  Procedure Laterality Date   ADENOIDECTOMY     AMPUTATION Right 08/11/2020   Procedure: AMPUTATION ABOVE KNEE;  Surgeon: [name], MD;  Location: [facility];  Service: Vascular;  Laterality: Right;   ARTERY BIOPSY Right 07/14/2019   Procedure: BIOPSY TEMPORAL ARTERY;  Surgeon: [name], MD;  Location: [facility];  Service: Vascular;  Laterality: Right;   BREAST CYST ASPIRATION Left    CARDIAC CATHETERIZATION  02/15/2017   [facility]   COLONOSCOPY WITH PROPOFOL N/A 10/22/2017   Procedure: COLONOSCOPY WITH PROPOFOL;  Surgeon: Lucilla Lame, MD;  Location: [place];  Service: Endoscopy;  Laterality: N/A;  specimens not taken--pt on Plavix will be brought back in after 7 days off med   COLONOSCOPY WITH  PROPOFOL N/A 11/05/2017   Procedure: COLONOSCOPY WITH PROPOFOL;  Surgeon: Lucilla Lame, MD;  Location: [facility];  Service: Endoscopy;  Laterality: N/A;   ESOPHAGOGASTRODUODENOSCOPY N/A 12/21/2014   Procedure: ESOPHAGOGASTRODUODENOSCOPY (EGD);  Surgeon: Lucilla Lame, MD;  Location: [place];  Service: Gastroenterology;  Laterality: N/A;   ESOPHAGOGASTRODUODENOSCOPY (EGD) WITH PROPOFOL N/A 02/08/2016   Procedure: ESOPHAGOGASTRODUODENOSCOPY (EGD) WITH PROPOFOL;  Surgeon: Lucilla Lame, MD;  Location: [facility];  Service: Endoscopy;  Laterality: N/A;   FASCIOTOMY Right 05/30/2019   Procedure: FASCIOTOMY;  Surgeon: [name], MD;  Location: [facility];  Service: Vascular;  Laterality: Right;   FASCIOTOMY CLOSURE Right 06/04/2019   Procedure: FASCIOTOMY CLOSURE;  Surgeon: [name], MD;  Location: [facility];  Service: Vascular;  Laterality: Right;   HEMORROIDECTOMY  2014   LOWER EXTREMITY ANGIOGRAPHY Right 05/30/2019   Procedure: LOWER EXTREMITY ANGIOGRAPHY;  Surgeon: [name], MD;  Location: [place];  Service: Cardiovascular;  Laterality: Right;   LOWER EXTREMITY ANGIOGRAPHY Right 07/02/2020   Procedure: LOWER EXTREMITY ANGIOGRAPHY;  Surgeon: [name], MD;  Location: [facility];  Service: Cardiovascular;  Laterality: Right;   PERIPHERAL VASCULAR CATHETERIZATION N/A 02/09/2016   Procedure: Abdominal Aortogram w/Lower Extremity;  Surgeon: [name], MD;  Location: [place] [facility];  Service: Cardiovascular;  Laterality: N/A;   PERIPHERAL VASCULAR CATHETERIZATION Right 02/10/2016   Procedure: Lower Extremity Angiography;  Surgeon: [name], MD;  Location: [place] [facility];  Service: Cardiovascular;  Laterality: Right;   POLYPECTOMY  11/05/2017   Procedure: POLYPECTOMY;  Surgeon: Lucilla Lame, MD;  Location: [place];  Service: Endoscopy;;   SHOULDER ARTHROSCOPY WITH ROTATOR CUFF REPAIR AND  SUBACROMIAL DECOMPRESSION Right 02/27/2020   Procedure: RIGHT SHOULDER ARTHROSCOPY SUBACROMIAL DECOMPRESSION, DISTAL CLAVICLE EXCISION AND MINI-OPEN ROTATOR CUFF REPAIR;  Surgeon: Thornton Park, MD;  Location: [facility];  Service: Orthopedics;  Laterality: Right;   TONSILLECTOMY     VASCULAR SURGERY  [id number]   Fem-Pop Bypass    There were no vitals filed for this visit.    Subjective Assessment - 05/04/21 1019     Subjective Pt states that she is not feeling well today. Her stomach is better but it is still hurting. She is still unable to eat much due to the stomach pain. Last night she had 10/10 phantom RLE pain which prevented her from sleeping well. 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 Pt denies falls since last therapy session. Pt states that has had increased RLE residual soreness at the distal aspect. Pt states that she feel the largest amount of pressure at the end of residual LE. Pt denies any redness, warmth, bruising, or other signs of skin break down at the R residual limb.    Pertinent History Pt has a history of RLE vascular issues since 1991. 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 She received her RLE prosthetic in April 2022 and has been receiving [facility] since her surgery. They discharged her yesterday to continue with OP PT. She continues with phantom RLE pain, especially at night and takes gabapentin with minimal relief. She has also been having R shoulder pain since the surgery which she attributes to using a wheelchair. She underwent R RTC surgery in September of 2021 and had physical therapy afterwards. She has been having issues with bilateral carpal tunnel and has a NCV study scheduled for tomorrow.    Limitations Walking    Patient Stated Goals Ambulate with her prosthetic               TREATMENT   Neuromuscular Re-education  Examination of residual RLE and socket with notable scratches in bottom of socket from  fastener on end of patient's liner. Pt reports that she is bearing weight through the end of her residual limb. Education performed with patient regarding proper weight bearing through residual RLE and pelvis with AKA prosthesis. Pt encouraged to try 5 ply sock to increase volume in order to increase girth of residual RLE;  With prosthetic donned:  Forward, backward, and side stepping in // bars without UE support and CGA from therapist x 4 lengths each direction; 1/2 foam roll forward and backward step-over x 10; 1/2 foam roll L lateral step-over x 10; Alternating 6" step taps without UE support x 10 BLE;   Mirror therapy performed with patient sitting at edge of mat table and mirror between legs to simulate right lower extremity.  Patient performed seated marches x 10, long arc quads with 3# ankle weights x 10, L ankle AROM plantar flexion/dorsiflexion/circles.   Ambulation outside with L single Lofstrand crutch over mulch, grass, sidewalk, and descending curb;   Pt educated throughout session about  proper posture and technique with exercises. Improved exercise technique, movement at target joints, use of target muscles after min to mod verbal, visual, tactile cues.      Patient demonstrates excellent motivation during session today.  She reports some residual RLE pain upon arrival. Examination of residual RLE and socket with notable scratches in bottom of socket from fastener on end of patient's liner. Pt reports that she is bearing weight through the end of her residual limb. Education performed with patient regarding proper weight bearing through residual RLE and pelvis with AKA prosthesis. Pt encouraged to try 5 ply sock to increase volume in order to increase girth of residual RLE. She continues to report high level of phantom limb pain today so repeated mirror therapy during session. Patient encouraged to continue HEP and follow-up as scheduled.  She will benefit from PT services to address  deficits in strength, balance, and mobility in order to return to full function at home.                            PT Short Term Goals - 04/20/21 1021       PT SHORT TERM GOAL #1   Title Pt will be independent with HEP in order to improve strength and balance in order to decrease fall risk and improve function at home.    Time 6    Period Weeks    Status On-going    Target Date 06/01/21               PT Long Term Goals - 04/20/21 1021       PT LONG TERM GOAL #1   Title Pt will improve BERG to at least 52/56 in order to demonstrate clinically significant improvement in balance and decrease risk for falls;    Baseline 01/26/21: 45/56; 03/02/21: 47/56; 04/20/21: 49/56    Time 12    Period Weeks    Status Partially Met    Target Date 07/13/21      PT LONG TERM GOAL #2   Title Pt will improve ABC by at least 13% in order to demonstrate clinically significant improvement in balance confidence.    Baseline 01/26/21: 57.5%; 03/02/21: 89.4%    Time 8    Period Weeks    Status Achieved    Target Date 07/13/21      PT LONG TERM GOAL #3   Title Pt will improve FOTO to at least 63 in order to demonstrate clinically significant improvement in function related to her RLE amputation    Baseline 01/26/21: 59; 03/02/21: 71; 04/06/21: 71    Time 8    Period Weeks    Status Achieved      PT LONG TERM GOAL #4   Title Pt will increase self-selected 10MWT to at least 0.6 m/s in order to demonstrate clinically significant improvement in community ambulation.    Baseline 01/26/21: 31.5s = 0.32 m/s; 03/02/21: 25.5s = 0.39 m/s; 04/06/21: 20.7s = 0.48 m/s    Time 8    Period Weeks    Status Partially Met    Target Date 07/13/21                   Plan - 05/04/21 1022     Clinical Impression Statement Patient demonstrates excellent motivation during session today.  She reports some residual RLE pain upon arrival. Examination of residual RLE and socket with notable  scratches in bottom of socket  from fastener on end of patient's liner. Pt reports that she is bearing weight through the end of her residual limb. Education performed with patient regarding proper weight bearing through residual RLE and pelvis with AKA prosthesis. Pt encouraged to try 5 ply sock to increase volume in order to increase girth of residual RLE. She continues to report high level of phantom limb pain today so repeated mirror therapy during session. Patient encouraged to continue HEP and follow-up as scheduled.  She will benefit from PT services to address deficits in strength, balance, and mobility in order to return to full function at home.    Personal Factors and Comorbidities Age;Comorbidity 3+    Comorbidities Depression, osteoporosis, vascular disease    Examination-Activity Limitations Bathing;Locomotion Level;Squat;Stairs;Stand;Transfers    Examination-Participation Restrictions Cleaning;Community Activity;Meal Prep;Yard Work    Stability/Clinical Decision Making Unstable/Unpredictable    Rehab Potential Good    PT Frequency 2x / week    PT Duration 12 weeks    PT Treatment/Interventions ADLs/Self Care Home Management;Aquatic Therapy;Canalith Repostioning;Cryotherapy;Electrical Stimulation;Iontophoresis 69m/ml Dexamethasone;Moist Heat;Traction;Ultrasound;Gait training;Stair training;Therapeutic activities;Therapeutic exercise;Neuromuscular re-education;Manual techniques;Dry needling;Vestibular;Spinal Manipulations;Joint Manipulations    PT Next Visit Plan strengthening, practice gait training with lofstrand crutches    PT Home Exercise Plan Access Code: [email or web address]    Consulted and Agree with Plan of Care Patient               Patient will benefit from skilled therapeutic intervention in order to improve the following deficits and impairments:  Abnormal gait, Decreased strength, Difficulty walking  Visit Diagnosis: Muscle weakness (generalized)  Difficulty in walking, not  elsewhere classified     Problem List Patient Active Problem List   Diagnosis Date Noted   Muscle spasm 09/09/2020   S/P AKA (above knee amputation), right ([facility] 09/08/2020   Ischemia of extremity 07/02/2020   Chronic pain in right shoulder 11/25/2019   Encounter for long-term (current) use of high-risk medication 07/16/2019   GCA (giant cell arteritis) ([facility] 07/16/2019   Temporal arteritis ([facility] 07/13/2019   Postoperative wound infection 06/23/2019   Ischemic leg 05/31/2019   Atherosclerosis of native arteries of extremity with intermittent claudication ([facility] 05/30/2019   Depression 05/29/2019   Eczema of lower extremity 03/04/2018   Chronic venous insufficiency 03/02/2018   Personal history of colonic polyps    Family history of colonic polyps    Benign neoplasm of ascending colon    Mitral valve insufficiency 02/08/2017   Dyspnea on exertion 02/07/2017   Non-rheumatic mitral regurgitation 02/07/2017   Precordial pain 02/07/2017   CAD (coronary artery disease) 12/21/2016   Essential hypertension 12/21/2016   Compression fracture of lumbar spine, non-traumatic ([facility] 04/26/2016   Compression fracture of L3 lumbar vertebra 04/20/2016   PVD (peripheral vascular disease) ([facility] 02/24/2016   Family history of premature CAD 02/24/2016   Gastritis    Other specified diseases of esophagus    Hiatal hernia    Gastritis and gastroduodenitis    Ischemia of lower extremity    Arterial occlusion (HCC)    Atherosclerosis of aorta (HCC)    History of smoking    Hyperlipidemia    Pain in the chest    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 02/05/2016   Carotid artery stenosis 01/04/2016   Vertigo, benign paroxysmal 12/28/2015   Clinical depression 09/06/2015   History of alcoholism ([facility] 09/06/2015   Angiopathy, 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[facility] 09/06/2015   Atherosclerosis of native arteries of extremity with rest pain ([facility] 09/06/2015   Acute non-recurrent maxillary  sinusitis 07/14/2015   Vaginal pruritus 05/26/2015   Chronic recurrent major depressive disorder ([place]) 03/09/2015   Osteoporosis, post-menopausal 03/09/2015   Peripheral blood vessel disorder ([place]) 03/09/2015   Acid reflux 03/09/2015   GERD (gastroesophageal reflux disease) 12/21/2014   Carotid artery narrowing 01/28/2014   Peripheral arterial occlusive disease ([place]) 06/08/2011   Occlusion and stenosis of unspecified carotid artery 06/08/2011   PAD (peripheral artery disease) ([place]) 06/08/2011   Phillips Grout PT, DPT, GCS  Adonys Wildes 05/04/2021, 9:24 PM  [facility] [place] [address]. [place], [place], [address] Phone: [phone]   Fax:  [phone]  Name: ROSELYNN WHITACRE MRN: [id number] Date of Birth: 1951-03-06

## 2021-05-05 ENCOUNTER — Telehealth (INDEPENDENT_AMBULATORY_CARE_PROVIDER_SITE_OTHER): Payer: Self-pay

## 2021-05-05 NOTE — Telephone Encounter (Signed)
Pt called and left a VM on the nurses line saying that she is having phantom pain though out the entire day and would  like to know what she can do . Please advise.

## 2021-05-05 NOTE — Telephone Encounter (Signed)
It is unusual that you are having worsening phantom limb pain that is lasting throughout the day so far from the original surgery.  Typically phantom pain decreases as time goes on.  We can move up your office visit that is listed for December to ensure that there is nothing circulation wise causing the worsening pain.  It is also possible that back problems can be irritating nerves that seem to make the phantom limb pain worse.  Lastly if there is no issues with your circulation then pain management would be the likely next steps.

## 2021-05-06 ENCOUNTER — Ambulatory Visit: Payer: Medicare Other

## 2021-05-06 ENCOUNTER — Other Ambulatory Visit: Payer: Self-pay

## 2021-05-06 DIAGNOSIS — R262 Difficulty in walking, not elsewhere classified: Secondary | ICD-10-CM

## 2021-05-06 DIAGNOSIS — M6281 Muscle weakness (generalized): Secondary | ICD-10-CM

## 2021-05-06 DIAGNOSIS — G546 Phantom limb syndrome with pain: Secondary | ICD-10-CM

## 2021-05-06 NOTE — Telephone Encounter (Signed)
Please move up the pts appt as the schedule allows per the NP.

## 2021-05-06 NOTE — Therapy (Signed)
[facility] [address]. [place], [place], [address] Phone: [phone]   Fax:  [phone]  Physical Therapy Treatment  Patient Details  Name: Sara [name] MRN: [id number] Date of Birth: 05-27-1951 Referring Provider (PT): Dr. Delana Meyer   Encounter Date: 05/06/2021   PT End of Session - 05/08/21 0927     Visit Number 29    Number of Visits 66    Date for PT Re-Evaluation 07/13/21    Authorization Type eval: 01/26/21    PT Start Time 1000    PT Stop Time 1045    PT Time Calculation (min) 45 min    Equipment Utilized During Treatment Gait belt    Activity Tolerance Patient tolerated treatment well    Behavior During Therapy [facility] for tasks assessed/performed                Past Medical History:  Diagnosis Date   Arthritis    right shoulder   Depression    Dyspnea    GERD (gastroesophageal reflux disease)    History of blood clots    Hyperlipidemia    Hypertension    Mild mitral regurgitation    Osteoporosis    Peripheral vascular disease ([place])    Stomach ulcer    Vascular disease    Sees Dr. Delana Meyer   Vertigo    Last episode approx Aug 2015   Wears dentures    full upper    Past Surgical History:  Procedure Laterality Date   ADENOIDECTOMY     AMPUTATION Right 08/11/2020   Procedure: AMPUTATION ABOVE KNEE;  Surgeon: [name], MD;  Location: [facility];  Service: Vascular;  Laterality: Right;   ARTERY BIOPSY Right 07/14/2019   Procedure: BIOPSY TEMPORAL ARTERY;  Surgeon: [name], MD;  Location: [facility];  Service: Vascular;  Laterality: Right;   BREAST CYST ASPIRATION Left    CARDIAC CATHETERIZATION  02/15/2017   [facility]   COLONOSCOPY WITH PROPOFOL N/A 10/22/2017   Procedure: COLONOSCOPY WITH PROPOFOL;  Surgeon: Lucilla Lame, MD;  Location: [place];  Service: Endoscopy;  Laterality: N/A;  specimens not taken--pt on Plavix will be brought back in after 7 days off med   COLONOSCOPY  WITH PROPOFOL N/A 11/05/2017   Procedure: COLONOSCOPY WITH PROPOFOL;  Surgeon: Lucilla Lame, MD;  Location: [place];  Service: Endoscopy;  Laterality: N/A;   ESOPHAGOGASTRODUODENOSCOPY N/A 12/21/2014   Procedure: ESOPHAGOGASTRODUODENOSCOPY (EGD);  Surgeon: Lucilla Lame, MD;  Location: [place];  Service: Gastroenterology;  Laterality: N/A;   ESOPHAGOGASTRODUODENOSCOPY (EGD) WITH PROPOFOL N/A 02/08/2016   Procedure: ESOPHAGOGASTRODUODENOSCOPY (EGD) WITH PROPOFOL;  Surgeon: Lucilla Lame, MD;  Location: [facility];  Service: Endoscopy;  Laterality: N/A;   FASCIOTOMY Right 05/30/2019   Procedure: FASCIOTOMY;  Surgeon: [name], MD;  Location: [facility];  Service: Vascular;  Laterality: Right;   FASCIOTOMY CLOSURE Right 06/04/2019   Procedure: FASCIOTOMY CLOSURE;  Surgeon: [name], MD;  Location: [facility];  Service: Vascular;  Laterality: Right;   HEMORROIDECTOMY  2014   LOWER EXTREMITY ANGIOGRAPHY Right 05/30/2019   Procedure: LOWER EXTREMITY ANGIOGRAPHY;  Surgeon: [name], MD;  Location: [place] [facility];  Service: Cardiovascular;  Laterality: Right;   LOWER EXTREMITY ANGIOGRAPHY Right 07/02/2020   Procedure: LOWER EXTREMITY ANGIOGRAPHY;  Surgeon: [name], MD;  Location: [place] [facility];  Service: Cardiovascular;  Laterality: Right;   PERIPHERAL VASCULAR CATHETERIZATION N/A 02/09/2016   Procedure: Abdominal Aortogram w/Lower Extremity;  Surgeon: [name], MD;  Location: [place] [facility];  Service: Cardiovascular;  Laterality: N/A;   PERIPHERAL VASCULAR CATHETERIZATION Right 02/10/2016   Procedure: Lower Extremity Angiography;  Surgeon: [name], MD;  Location: [place] [facility];  Service: Cardiovascular;  Laterality: Right;   POLYPECTOMY  11/05/2017   Procedure: POLYPECTOMY;  Surgeon: Lucilla Lame, MD;  Location: [place];  Service: Endoscopy;;   SHOULDER ARTHROSCOPY WITH ROTATOR CUFF REPAIR AND  SUBACROMIAL DECOMPRESSION Right 02/27/2020   Procedure: RIGHT SHOULDER ARTHROSCOPY SUBACROMIAL DECOMPRESSION, DISTAL CLAVICLE EXCISION AND MINI-OPEN ROTATOR CUFF REPAIR;  Surgeon: Thornton Park, MD;  Location: [facility];  Service: Orthopedics;  Laterality: Right;   TONSILLECTOMY     VASCULAR SURGERY  [id number]   Fem-Pop Bypass    There were no vitals filed for this visit.    Subjective Assessment - 05/08/21 0925     Subjective Pt states that she is not feeling well today. Her stomach is better. 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 She saw the NP at vascular and pt states that she was told her that her pain was not related to phantom pain. Pt denies falls since last therapy session. She has started using the 5-ply sock which has helped with the distal residual limb pain.    Pertinent History Pt has a history of RLE vascular issues since 1991. 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 She received her RLE prosthetic in April 2022 and has been receiving [facility] since her surgery. They discharged her yesterday to continue with OP PT. She continues with phantom RLE pain, especially at night and takes gabapentin with minimal relief. She has also been having R shoulder pain since the surgery which she attributes to using a wheelchair. She underwent R RTC surgery in September of 2021 and had physical therapy afterwards. She has been having issues with bilateral carpal tunnel and has a NCV study scheduled for tomorrow.    Limitations Walking    Patient Stated Goals Ambulate with her prosthetic               TREATMENT   Ther-ex  With prosthetic doffed: Supine R SLR hip flexion with manual resistance from therapist 2 x 20; Supine RLE bridge with residual RLE resting on stool (towel on top for cushion), arms at side, 3s hold, 2 x 20; Supine RLE long axis manually resisted external and internal rotation 2 x 20 each; Supine RLE adduction with manual  resistance 2 x 20; L sidelying R hip abduction with manual resistance 2 x 20; L sidelying R hip extension from 90 hip flexion to available end range extension with manual resistance 2 x 20; Sidelying R hip flexor manual stretch x 60s;   Neuromuscular Re-education  Mirror therapy performed with patient sitting at edge of mat table and mirror between legs to simulate right lower extremity.  Patient performed seated marches x 10, long arc quads with 3# ankle weights x 10, L ankle AROM plantar flexion/dorsiflexion/circles, L ankle ball circles CW/CCW x 10 each;  Obstacle course with 3" 1/2 foam roll, 6" foam roll, and 6" step with single Lofstrand crutch in LUE x 4 lengths with seated rest break after first two;   Pt educated throughout session about proper posture and technique with exercises. Improved exercise technique, movement at target joints, use of target muscles after min to mod verbal, visual, tactile cues.      Patient demonstrates excellent motivation during  session today. She continues to report high level of phantom limb pain today so repeated mirror therapy during session. Pt has started ambulating with single Lofstrand crutch at home so practiced an obstacle course in the gym today. Patient encouraged to continue HEP and follow-up as scheduled.  She will benefit from PT services to address deficits in strength, balance, and mobility in order to return to full function at home.                            PT Short Term Goals - 04/20/21 1021       PT SHORT TERM GOAL #1   Title Pt will be independent with HEP in order to improve strength and balance in order to decrease fall risk and improve function at home.    Time 6    Period Weeks    Status On-going    Target Date 06/01/21               PT Long Term Goals - 04/20/21 1021       PT LONG TERM GOAL #1   Title Pt will improve BERG to at least 52/56 in order to demonstrate clinically significant  improvement in balance and decrease risk for falls;    Baseline 01/26/21: 45/56; 03/02/21: 47/56; 04/20/21: 49/56    Time 12    Period Weeks    Status Partially Met    Target Date 07/13/21      PT LONG TERM GOAL #2   Title Pt will improve ABC by at least 13% in order to demonstrate clinically significant improvement in balance confidence.    Baseline 01/26/21: 57.5%; 03/02/21: 89.4%    Time 8    Period Weeks    Status Achieved    Target Date 07/13/21      PT LONG TERM GOAL #3   Title Pt will improve FOTO to at least 63 in order to demonstrate clinically significant improvement in function related to her RLE amputation    Baseline 01/26/21: 59; 03/02/21: 71; 04/06/21: 71    Time 8    Period Weeks    Status Achieved      PT LONG TERM GOAL #4   Title Pt will increase self-selected 10MWT to at least 0.6 m/s in order to demonstrate clinically significant improvement in community ambulation.    Baseline 01/26/21: 31.5s = 0.32 m/s; 03/02/21: 25.5s = 0.39 m/s; 04/06/21: 20.7s = 0.48 m/s    Time 8    Period Weeks    Status Partially Met    Target Date 07/13/21                   Plan - 05/08/21 6948     Clinical Impression Statement Patient demonstrates excellent motivation during session today. She continues to report high level of phantom limb pain today so repeated mirror therapy during session. Pt has started ambulating with single Lofstrand crutch at home so practiced an obstacle course in the gym today. Patient encouraged to continue HEP and follow-up as scheduled.  She will benefit from PT services to address deficits in strength, balance, and mobility in order to return to full function at home.    Personal Factors and Comorbidities Age;Comorbidity 3+    Comorbidities Depression, osteoporosis, vascular disease    Examination-Activity Limitations Bathing;Locomotion Level;Squat;Stairs;Stand;Transfers    Examination-Participation Restrictions Cleaning;Community Activity;Meal Prep;Yard Work     Stability/Clinical Decision Making Unstable/Unpredictable    Rehab Potential Good  PT Frequency 2x / week    PT Duration 12 weeks    PT Treatment/Interventions ADLs/Self Care Home Management;Aquatic Therapy;Canalith Repostioning;Cryotherapy;Electrical Stimulation;Iontophoresis 88m/ml Dexamethasone;Moist Heat;Traction;Ultrasound;Gait training;Stair training;Therapeutic activities;Therapeutic exercise;Neuromuscular re-education;Manual techniques;Dry needling;Vestibular;Spinal Manipulations;Joint Manipulations    PT Next Visit Plan Progress note, strengthening, practice gait training with lofstrand crutches    PT Home Exercise Plan Access Code: [email or web address]    Consulted and Agree with Plan of Care Patient                Patient will benefit from skilled therapeutic intervention in order to improve the following deficits and impairments:  Abnormal gait, Decreased strength, Difficulty walking  Visit Diagnosis: Muscle weakness (generalized)  Difficulty in walking, not elsewhere classified  Phantom limb syndrome with pain ([facility]     Problem List Patient Active Problem List   Diagnosis Date Noted   Muscle spasm 09/09/2020   S/P AKA (above knee amputation), right ([place] 09/08/2020   Ischemia of extremity 07/02/2020   Chronic pain in right shoulder 11/25/2019   Encounter for long-term (current) use of high-risk medication 07/16/2019   GCA (giant cell arteritis) ([place] 07/16/2019   Temporal arteritis ([place] 07/13/2019   Postoperative wound infection 06/23/2019   Ischemic leg 05/31/2019   Atherosclerosis of native arteries of extremity with intermittent claudication ([place] 05/30/2019   Depression 05/29/2019   Eczema of lower extremity 03/04/2018   Chronic venous insufficiency 03/02/2018   Personal history of colonic polyps    Family history of colonic polyps    Benign neoplasm of ascending colon    Mitral valve insufficiency 02/08/2017   Dyspnea on exertion 02/07/2017    Non-rheumatic mitral regurgitation 02/07/2017   Precordial pain 02/07/2017   CAD (coronary artery disease) 12/21/2016   Essential hypertension 12/21/2016   Compression fracture of lumbar spine, non-traumatic ([place] 04/26/2016   Compression fracture of L3 lumbar vertebra 04/20/2016   PVD (peripheral vascular disease) ([facility] 02/24/2016   Family history of premature CAD 02/24/2016   Gastritis    Other specified diseases of esophagus    Hiatal hernia    Gastritis and gastroduodenitis    Ischemia of lower extremity    Arterial occlusion (HCC)    Atherosclerosis of aorta (HCC)    History of smoking    Hyperlipidemia    Pain in the chest    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 02/05/2016   Carotid artery stenosis 01/04/2016   Vertigo, benign paroxysmal 12/28/2015   Clinical depression 09/06/2015   History of alcoholism ([place] 09/06/2015   Angiopathy, 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[place] 09/06/2015   Atherosclerosis of native arteries of extremity with rest pain ([place] 09/06/2015   Acute non-recurrent maxillary sinusitis 07/14/2015   Vaginal pruritus 05/26/2015   Chronic recurrent major depressive disorder ([place] 03/09/2015   Osteoporosis, post-menopausal 03/09/2015   Peripheral blood vessel disorder ([place] 03/09/2015   Acid reflux 03/09/2015   GERD (gastroesophageal reflux disease) 12/21/2014   Carotid artery narrowing 01/28/2014   Peripheral arterial occlusive disease ([place] 06/08/2011   Occlusion and stenosis of unspecified carotid artery 06/08/2011   PAD (peripheral artery disease) ([place] 06/08/2011   JLyndel SafeHuprich PT, DPT, GCS  Kiona Blume 05/08/2021, 9:31 AM  [place] [facility] [place] [address]Phone: [phone]  Fax:  [phone] Name: HSHALANDRIA ELSBERNDMRN: [id number] of Birth: 209-16-1952

## 2021-05-06 NOTE — Telephone Encounter (Signed)
Lvm to callback to be scheduled

## 2021-05-11 ENCOUNTER — Ambulatory Visit: Payer: Medicare Other

## 2021-05-11 ENCOUNTER — Other Ambulatory Visit: Payer: Self-pay

## 2021-05-11 DIAGNOSIS — R262 Difficulty in walking, not elsewhere classified: Secondary | ICD-10-CM

## 2021-05-11 DIAGNOSIS — M6281 Muscle weakness (generalized): Secondary | ICD-10-CM | POA: Diagnosis not present

## 2021-05-11 DIAGNOSIS — R2681 Unsteadiness on feet: Secondary | ICD-10-CM

## 2021-05-11 NOTE — Therapy (Signed)
[facility] [address] [place], [place], [address] Phone: [phone]   Fax:  [phone]  Physical Therapy Progress Note  Dates of reporting period  04/06/21  to   05/11/21  Patient Details  Name: Sara [name] MRN: [id number] Date of Birth: 16-Sep-1950 Referring Provider (PT): Dr. Delana Meyer   Encounter Date: 05/11/2021   PT End of Session - 05/11/21 0958     Visit Number 30    Number of Visits 40    Date for PT Re-Evaluation 07/13/21    Authorization Type eval: 01/26/21    PT Start Time 0950    PT Stop Time 1035    PT Time Calculation (min) 45 min    Equipment Utilized During Treatment Gait belt    Activity Tolerance Patient tolerated treatment well    Behavior During Therapy [facility] for tasks assessed/performed                Past Medical History:  Diagnosis Date   Arthritis    right shoulder   Depression    Dyspnea    GERD (gastroesophageal reflux disease)    History of blood clots    Hyperlipidemia    Hypertension    Mild mitral regurgitation    Osteoporosis    Peripheral vascular disease ([place])    Stomach ulcer    Vascular disease    Sees Dr. Delana Meyer   Vertigo    Last episode approx Aug 2015   Wears dentures    full upper    Past Surgical History:  Procedure Laterality Date   ADENOIDECTOMY     AMPUTATION Right 08/11/2020   Procedure: AMPUTATION ABOVE KNEE;  Surgeon: [name], MD;  Location: [facility];  Service: Vascular;  Laterality: Right;   ARTERY BIOPSY Right 07/14/2019   Procedure: BIOPSY TEMPORAL ARTERY;  Surgeon: [name], MD;  Location: [facility];  Service: Vascular;  Laterality: Right;   BREAST CYST ASPIRATION Left    CARDIAC CATHETERIZATION  02/15/2017   [facility]   COLONOSCOPY WITH PROPOFOL N/A 10/22/2017   Procedure: COLONOSCOPY WITH PROPOFOL;  Surgeon: Lucilla Lame, MD;  Location: [place];  Service: Endoscopy;  Laterality: N/A;  specimens not taken--pt on Plavix will  be brought back in after 7 days off med   COLONOSCOPY WITH PROPOFOL N/A 11/05/2017   Procedure: COLONOSCOPY WITH PROPOFOL;  Surgeon: Lucilla Lame, MD;  Location: [place];  Service: Endoscopy;  Laterality: N/A;   ESOPHAGOGASTRODUODENOSCOPY N/A 12/21/2014   Procedure: ESOPHAGOGASTRODUODENOSCOPY (EGD);  Surgeon: Lucilla Lame, MD;  Location: [place];  Service: Gastroenterology;  Laterality: N/A;   ESOPHAGOGASTRODUODENOSCOPY (EGD) WITH PROPOFOL N/A 02/08/2016   Procedure: ESOPHAGOGASTRODUODENOSCOPY (EGD) WITH PROPOFOL;  Surgeon: Lucilla Lame, MD;  Location: [facility];  Service: Endoscopy;  Laterality: N/A;   FASCIOTOMY Right 05/30/2019   Procedure: FASCIOTOMY;  Surgeon: [name], MD;  Location: [facility];  Service: Vascular;  Laterality: Right;   FASCIOTOMY CLOSURE Right 06/04/2019   Procedure: FASCIOTOMY CLOSURE;  Surgeon: [name], MD;  Location: [facility];  Service: Vascular;  Laterality: Right;   HEMORROIDECTOMY  2014   LOWER EXTREMITY ANGIOGRAPHY Right 05/30/2019   Procedure: LOWER EXTREMITY ANGIOGRAPHY;  Surgeon: [name], MD;  Location: [place] [facility];  Service: Cardiovascular;  Laterality: Right;   LOWER EXTREMITY ANGIOGRAPHY Right 07/02/2020   Procedure: LOWER EXTREMITY ANGIOGRAPHY;  Surgeon: [name], MD;  Location: [place] [facility];  Service: Cardiovascular;  Laterality: Right;  PERIPHERAL VASCULAR CATHETERIZATION N/A 02/09/2016   Procedure: Abdominal Aortogram w/Lower Extremity;  Surgeon: [name], MD;  Location: [place] [facility];  Service: Cardiovascular;  Laterality: N/A;   PERIPHERAL VASCULAR CATHETERIZATION Right 02/10/2016   Procedure: Lower Extremity Angiography;  Surgeon: [name], MD;  Location: [place] [facility];  Service: Cardiovascular;  Laterality: Right;   POLYPECTOMY  11/05/2017   Procedure: POLYPECTOMY;  Surgeon: Lucilla Lame, MD;  Location: [place];  Service: Endoscopy;;    SHOULDER ARTHROSCOPY WITH ROTATOR CUFF REPAIR AND SUBACROMIAL DECOMPRESSION Right 02/27/2020   Procedure: RIGHT SHOULDER ARTHROSCOPY SUBACROMIAL DECOMPRESSION, DISTAL CLAVICLE EXCISION AND MINI-OPEN ROTATOR CUFF REPAIR;  Surgeon: Thornton Park, MD;  Location: [facility];  Service: Orthopedics;  Laterality: Right;   TONSILLECTOMY     VASCULAR SURGERY  [phone]   Fem-Pop Bypass    There were no vitals filed for this visit.    Subjective Assessment - 05/11/21 0957     Subjective Pt states that she is feeling well today. Her stomach continues to feel better. Phantom limb pain has resolved since her PCP increased her gabapentin. Pt denies falls since last therapy session. She has continued using the 5-ply sock which has helped with the distal residual limb pain.    Pertinent History Pt has a history of RLE vascular issues since 1991. 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 She received her RLE prosthetic in April 2022 and has been receiving [facility] since her surgery. They discharged her yesterday to continue with OP PT. She continues with phantom RLE pain, especially at night and takes gabapentin with minimal relief. She has also been having R shoulder pain since the surgery which she attributes to using a wheelchair. She underwent R RTC surgery in September of 2021 and had physical therapy afterwards. She has been having issues with bilateral carpal tunnel and has a NCV study scheduled for tomorrow.    Limitations Walking    Patient Stated Goals Ambulate with her prosthetic               TREATMENT  Neuromuscular Re-education  Mirror therapy performed with patient sitting at edge of mat table and mirror between legs to simulate right lower extremity.  Patient performed seated marches x 10, long arc quads with 3# ankle weights x 10, L ankle AROM plantar flexion/dorsiflexion/circles, L ankle ball circles CW/CCW x 10 each; Seated on 1/2 BOSU (round side  up) ball tosses with therapist x 1 minute to challenge dynamic balance; Seated on 1/2 BOSU dynamic perturbations x 1 minute to challenge dynamic balance; Seated on 1/2 BOSU (round side up) D1 flexion with red tband BUE x 10 on each side; Extensive gait in rehab gym and hallway with no crutches while pt performs dual task challenge including alphabet boy/girl names as well as naming vegetables. Pt requires a seated rest break; Side stepping in // bars without UE support x 4 lengths; Forward/backward gait in // bars without UE support x 4 lengths each; Obstacle course in // bars with 3" 1/2 foam roll step over and 6" step ascend/descend without any UE support x 4 trials;   Pt educated throughout session about proper posture and technique with exercises. Improved exercise technique, movement at target joints, use of target muscles after min to mod verbal, visual, tactile cues.      Patient demonstrates excellent motivation during session today.  Updated outcome measures and goals with patient during 04/06/21 visit. At that  time her FOTO score remained at 71. Her BERG increased by 2 points to 49/56.  Her 96mgait speed increased to 0.48 m/s. Overall patient demonstrates significant improvement and has progressed to ambulation with bilateral Lofstrand crutches and occasional single Lofstrand crutch. Continued with additional balance training working on seated stability on unstable surfaces as well as gait without any UE support. No HEP progression today. Patient's condition has the potential to improve in response to therapy. Maximum improvement is yet to be obtained. The anticipated improvement is attainable and reasonable in a generally predictable time. Patient encouraged to follow-up as scheduled. Pt will benefit from PT services to address deficits in strength, balance, and mobility in order to return to full function at home.                          PT Short Term Goals - 05/11/21  1000       PT SHORT TERM GOAL #1   Title Pt will be independent with HEP in order to improve strength and balance in order to decrease fall risk and improve function at home.    Time 6    Period Weeks    Status On-going    Target Date 06/01/21               PT Long Term Goals - 05/11/21 1000       PT LONG TERM GOAL #1   Title Pt will improve BERG to at least 52/56 in order to demonstrate clinically significant improvement in balance and decrease risk for falls;    Baseline 01/26/21: 45/56; 03/02/21: 47/56; 04/20/21: 49/56    Time 12    Period Weeks    Status Partially Met    Target Date 07/13/21      PT LONG TERM GOAL #2   Title Pt will improve ABC by at least 13% in order to demonstrate clinically significant improvement in balance confidence.    Baseline 01/26/21: 57.5%; 03/02/21: 89.4%    Time 8    Period Weeks    Status Achieved      PT LONG TERM GOAL #3   Title Pt will improve FOTO to at least 63 in order to demonstrate clinically significant improvement in function related to her RLE amputation    Baseline 01/26/21: 59; 03/02/21: 71; 04/06/21: 71    Time 8    Period Weeks    Status Achieved      PT LONG TERM GOAL #4   Title Pt will increase self-selected 10MWT to at least 0.6 m/s in order to demonstrate clinically significant improvement in community ambulation.    Baseline 01/26/21: 31.5s = 0.32 m/s; 03/02/21: 25.5s = 0.39 m/s; 04/06/21: 20.7s = 0.48 m/s    Time 8    Period Weeks    Status Partially Met    Target Date 07/13/21                   Plan - 05/11/21 0959     Clinical Impression Statement Patient demonstrates excellent motivation during session today.  Updated outcome measures and goals with patient during 04/06/21 visit. At that time her FOTO score remained at 71. Her BERG increased by 2 points to 49/56.  Her 164mait speed increased to 0.48 m/s. Overall patient demonstrates significant improvement and has progressed to ambulation with bilateral  Lofstrand crutches and occasional single Lofstrand crutch. Continued with additional balance training working on seated stability on unstable surfaces  as well as gait without any UE support. No HEP progression today. Patient's condition has the potential to improve in response to therapy. Maximum improvement is yet to be obtained. The anticipated improvement is attainable and reasonable in a generally predictable time. Patient encouraged to follow-up as scheduled. Pt will benefit from PT services to address deficits in strength, balance, and mobility in order to return to full function at home.    Personal Factors and Comorbidities Age;Comorbidity 3+    Comorbidities Depression, osteoporosis, vascular disease    Examination-Activity Limitations Bathing;Locomotion Level;Squat;Stairs;Stand;Transfers    Examination-Participation Restrictions Cleaning;Community Activity;Meal Prep;Yard Work    Stability/Clinical Decision Making Unstable/Unpredictable    Rehab Potential Good    PT Frequency 2x / week    PT Duration 12 weeks    PT Treatment/Interventions ADLs/Self Care Home Management;Aquatic Therapy;Canalith Repostioning;Cryotherapy;Electrical Stimulation;Iontophoresis 34m/ml Dexamethasone;Moist Heat;Traction;Ultrasound;Gait training;Stair training;Therapeutic activities;Therapeutic exercise;Neuromuscular re-education;Manual techniques;Dry needling;Vestibular;Spinal Manipulations;Joint Manipulations    PT Next Visit Plan strengthening, practice gait training with lofstrand crutches    PT Home Exercise Plan Access Code: [email or web address]    Consulted and Agree with Plan of Care Patient                Patient will benefit from skilled therapeutic intervention in order to improve the following deficits and impairments:  Abnormal gait, Decreased strength, Difficulty walking  Visit Diagnosis: Muscle weakness (generalized)  Difficulty in walking, not elsewhere classified  Unsteadiness on  feet     Problem List Patient Active Problem List   Diagnosis Date Noted   Muscle spasm 09/09/2020   S/P AKA (above knee amputation), right ([place] 09/08/2020   Ischemia of extremity 07/02/2020   Chronic pain in right shoulder 11/25/2019   Encounter for long-term (current) use of high-risk medication 07/16/2019   GCA (giant cell arteritis) ([facility] 07/16/2019   Temporal arteritis ([facility] 07/13/2019   Postoperative wound infection 06/23/2019   Ischemic leg 05/31/2019   Atherosclerosis of native arteries of extremity with intermittent claudication ([place] 05/30/2019   Depression 05/29/2019   Eczema of lower extremity 03/04/2018   Chronic venous insufficiency 03/02/2018   Personal history of colonic polyps    Family history of colonic polyps    Benign neoplasm of ascending colon    Mitral valve insufficiency 02/08/2017   Dyspnea on exertion 02/07/2017   Non-rheumatic mitral regurgitation 02/07/2017   Precordial pain 02/07/2017   CAD (coronary artery disease) 12/21/2016   Essential hypertension 12/21/2016   Compression fracture of lumbar spine, non-traumatic ([place] 04/26/2016   Compression fracture of L3 lumbar vertebra 04/20/2016   PVD (peripheral vascular disease) ([facility] 02/24/2016   Family history of premature CAD 02/24/2016   Gastritis    Other specified diseases of esophagus    Hiatal hernia    Gastritis and gastroduodenitis    Ischemia of lower extremity    Arterial occlusion (HCC)    Atherosclerosis of aorta ([facility]    History of smoking    Hyperlipidemia    Pain in the chest    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 02/05/2016   Carotid artery stenosis 01/04/2016   Vertigo, benign paroxysmal 12/28/2015   Clinical depression 09/06/2015   History of alcoholism ([facility] 09/06/2015   Angiopathy, 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[facility] 09/06/2015   Atherosclerosis of native arteries of extremity with rest pain ([place] 09/06/2015   Acute non-recurrent maxillary sinusitis 07/14/2015    Vaginal pruritus 05/26/2015   Chronic recurrent major depressive disorder ([facility] 03/09/2015   Osteoporosis, post-menopausal 03/09/2015   Peripheral blood vessel disorder ([facility]  03/09/2015   Acid reflux 03/09/2015   GERD (gastroesophageal reflux disease) 12/21/2014   Carotid artery narrowing 01/28/2014   Peripheral arterial occlusive disease ([place]) 06/08/2011   Occlusion and stenosis of unspecified carotid artery 06/08/2011   PAD (peripheral artery disease) ([place]) 06/08/2011   Phillips Grout PT, DPT, GCS  Anela Bensman 05/11/2021, 10:56 AM  [facility] [address] [place], [place], [address] Phone: [phone]   Fax:  [phone]  Name: Sara [name] MRN: [id number] Date of Birth: [birth year]

## 2021-05-13 ENCOUNTER — Other Ambulatory Visit: Payer: Self-pay

## 2021-05-13 ENCOUNTER — Ambulatory Visit: Payer: Medicare Other

## 2021-05-13 DIAGNOSIS — M6281 Muscle weakness (generalized): Secondary | ICD-10-CM

## 2021-05-13 DIAGNOSIS — R262 Difficulty in walking, not elsewhere classified: Secondary | ICD-10-CM

## 2021-05-13 NOTE — Therapy (Signed)
[facility] [address] [place], [place], [address] Phone: [phone]   Fax:  [phone]  Physical Therapy Treatment  Patient Details  Name: Sara [name] MRN: [id number] Date of Birth: 06/25/1951 Referring Provider (PT): Dr. Delana Meyer   Encounter Date: 05/13/2021   PT End of Session - 05/13/21 1718     Visit Number 31    Number of Visits 20    Date for PT Re-Evaluation 07/13/21    Authorization Type eval: 01/26/21    PT Start Time 1020    PT Stop Time 1100    PT Time Calculation (min) 40 min    Equipment Utilized During Treatment Gait belt    Activity Tolerance Patient tolerated treatment well    Behavior During Therapy [facility] for tasks assessed/performed                Past Medical History:  Diagnosis Date   Arthritis    right shoulder   Depression    Dyspnea    GERD (gastroesophageal reflux disease)    History of blood clots    Hyperlipidemia    Hypertension    Mild mitral regurgitation    Osteoporosis    Peripheral vascular disease ([place])    Stomach ulcer    Vascular disease    Sees Dr. Delana Meyer   Vertigo    Last episode approx Aug 2015   Wears dentures    full upper    Past Surgical History:  Procedure Laterality Date   ADENOIDECTOMY     AMPUTATION Right 08/11/2020   Procedure: AMPUTATION ABOVE KNEE;  Surgeon: [name], MD;  Location: [facility];  Service: Vascular;  Laterality: Right;   ARTERY BIOPSY Right 07/14/2019   Procedure: BIOPSY TEMPORAL ARTERY;  Surgeon: [name], MD;  Location: [facility];  Service: Vascular;  Laterality: Right;   BREAST CYST ASPIRATION Left    CARDIAC CATHETERIZATION  02/15/2017   [facility]   COLONOSCOPY WITH PROPOFOL N/A 10/22/2017   Procedure: COLONOSCOPY WITH PROPOFOL;  Surgeon: Lucilla Lame, MD;  Location: [place];  Service: Endoscopy;  Laterality: N/A;  specimens not taken--pt on Plavix will be brought back in after 7 days off med   COLONOSCOPY  WITH PROPOFOL N/A 11/05/2017   Procedure: COLONOSCOPY WITH PROPOFOL;  Surgeon: Lucilla Lame, MD;  Location: [place];  Service: Endoscopy;  Laterality: N/A;   ESOPHAGOGASTRODUODENOSCOPY N/A 12/21/2014   Procedure: ESOPHAGOGASTRODUODENOSCOPY (EGD);  Surgeon: Lucilla Lame, MD;  Location: [place];  Service: Gastroenterology;  Laterality: N/A;   ESOPHAGOGASTRODUODENOSCOPY (EGD) WITH PROPOFOL N/A 02/08/2016   Procedure: ESOPHAGOGASTRODUODENOSCOPY (EGD) WITH PROPOFOL;  Surgeon: Lucilla Lame, MD;  Location: [facility];  Service: Endoscopy;  Laterality: N/A;   FASCIOTOMY Right 05/30/2019   Procedure: FASCIOTOMY;  Surgeon: [name], MD;  Location: [facility];  Service: Vascular;  Laterality: Right;   FASCIOTOMY CLOSURE Right 06/04/2019   Procedure: FASCIOTOMY CLOSURE;  Surgeon: [name], MD;  Location: [facility];  Service: Vascular;  Laterality: Right;   HEMORROIDECTOMY  2014   LOWER EXTREMITY ANGIOGRAPHY Right 05/30/2019   Procedure: LOWER EXTREMITY ANGIOGRAPHY;  Surgeon: [name], MD;  Location: [place] [facility];  Service: Cardiovascular;  Laterality: Right;   LOWER EXTREMITY ANGIOGRAPHY Right 07/02/2020   Procedure: LOWER EXTREMITY ANGIOGRAPHY;  Surgeon: [name], MD;  Location: [place] [facility];  Service: Cardiovascular;  Laterality: Right;   PERIPHERAL VASCULAR CATHETERIZATION N/A 02/09/2016   Procedure: Abdominal Aortogram w/Lower Extremity;  Surgeon: [name], MD;  Location: [place] [facility];  Service: Cardiovascular;  Laterality: N/A;   PERIPHERAL VASCULAR CATHETERIZATION Right 02/10/2016   Procedure: Lower Extremity Angiography;  Surgeon: [name], MD;  Location: [place] [facility];  Service: Cardiovascular;  Laterality: Right;   POLYPECTOMY  11/05/2017   Procedure: POLYPECTOMY;  Surgeon: Lucilla Lame, MD;  Location: [place];  Service: Endoscopy;;   SHOULDER ARTHROSCOPY WITH ROTATOR CUFF REPAIR AND  SUBACROMIAL DECOMPRESSION Right 02/27/2020   Procedure: RIGHT SHOULDER ARTHROSCOPY SUBACROMIAL DECOMPRESSION, DISTAL CLAVICLE EXCISION AND MINI-OPEN ROTATOR CUFF REPAIR;  Surgeon: Thornton Park, MD;  Location: [facility];  Service: Orthopedics;  Laterality: Right;   TONSILLECTOMY     VASCULAR SURGERY  8850,[id number]   Fem-Pop Bypass    There were no vitals filed for this visit.    Subjective Assessment - 05/13/21 1716     Subjective Pt states that she is feeling well today. No complaints of stomach pain upon arrival. Phantom limb pain was bothersome last night. She reports some residual RLE pain upon arrival today after having to stand for a long time yesterday. Pt denies falls since last therapy session. No specific questions or concerns currently.    Pertinent History Pt has a history of RLE vascular issues since 1991. 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 She received her RLE prosthetic in April 2022 and has been receiving [facility] since her surgery. They discharged her yesterday to continue with OP PT. She continues with phantom RLE pain, especially at night and takes gabapentin with minimal relief. She has also been having R shoulder pain since the surgery which she attributes to using a wheelchair. She underwent R RTC surgery in September of 2021 and had physical therapy afterwards. She has been having issues with bilateral carpal tunnel and has a NCV study scheduled for tomorrow.    Limitations Walking    Patient Stated Goals Ambulate with her prosthetic               TREATMENT   Ther-ex  With prosthetic doffed: Supine R SLR hip flexion with manual resistance from therapist 2 x 20; Supine RLE bridge with residual RLE resting on stool (towel on top for cushion), arms at side, 3s hold, 2 x 20; Supine RLE long axis manually resisted external and internal rotation 2 x 20 each; Supine RLE adduction with manual resistance 2 x 20; L sidelying R hip  abduction with manual resistance 2 x 20; L sidelying R hip extension from 90 hip flexion to available end range extension with manual resistance 2 x 20; Prone R hip extension with manual resistance from therapist 2 x 20;   Neuromuscular Re-education  Mirror therapy performed with patient sitting at edge of mat table and mirror between legs to simulate right lower extremity.  Patient performed seated marches x 10, long arc quads with 3# ankle weights x 10, L ankle AROM plantar flexion/dorsiflexion/circles, L ankle ball circles CW/CCW x 10 each;   Pt educated throughout session about proper posture and technique with exercises. Improved exercise technique, movement at target joints, use of target muscles after min to mod verbal, visual, tactile cues.      Patient demonstrates excellent motivation during session today. She continues to report phantom limb pain today so repeated mirror therapy during session. Pt reports some distal residual RLE pain upon arrival so performed mat table strengthening today and avoided weightbearing. Patient encouraged to continue HEP and  follow-up as scheduled.  She will benefit from PT services to address deficits in strength, balance, and mobility in order to return to full function at home.                            PT Short Term Goals - 05/11/21 1000       PT SHORT TERM GOAL #1   Title Pt will be independent with HEP in order to improve strength and balance in order to decrease fall risk and improve function at home.    Time 6    Period Weeks    Status On-going    Target Date 06/01/21               PT Long Term Goals - 05/11/21 1000       PT LONG TERM GOAL #1   Title Pt will improve BERG to at least 52/56 in order to demonstrate clinically significant improvement in balance and decrease risk for falls;    Baseline 01/26/21: 45/56; 03/02/21: 47/56; 04/20/21: 49/56    Time 12    Period Weeks    Status Partially Met    Target  Date 07/13/21      PT LONG TERM GOAL #2   Title Pt will improve ABC by at least 13% in order to demonstrate clinically significant improvement in balance confidence.    Baseline 01/26/21: 57.5%; 03/02/21: 89.4%    Time 8    Period Weeks    Status Achieved      PT LONG TERM GOAL #3   Title Pt will improve FOTO to at least 63 in order to demonstrate clinically significant improvement in function related to her RLE amputation    Baseline 01/26/21: 59; 03/02/21: 71; 04/06/21: 71    Time 8    Period Weeks    Status Achieved      PT LONG TERM GOAL #4   Title Pt will increase self-selected 10MWT to at least 0.6 m/s in order to demonstrate clinically significant improvement in community ambulation.    Baseline 01/26/21: 31.5s = 0.32 m/s; 03/02/21: 25.5s = 0.39 m/s; 04/06/21: 20.7s = 0.48 m/s    Time 8    Period Weeks    Status Partially Met    Target Date 07/13/21                   Plan - 05/13/21 1719     Clinical Impression Statement Patient demonstrates excellent motivation during session today. She continues to report phantom limb pain today so repeated mirror therapy during session. Pt reports some distal residual RLE pain upon arrival so performed mat table strengthening today and avoided weightbearing. Patient encouraged to continue HEP and follow-up as scheduled.  She will benefit from PT services to address deficits in strength, balance, and mobility in order to return to full function at home.    Personal Factors and Comorbidities Age;Comorbidity 3+    Comorbidities Depression, osteoporosis, vascular disease    Examination-Activity Limitations Bathing;Locomotion Level;Squat;Stairs;Stand;Transfers    Examination-Participation Restrictions Cleaning;Community Activity;Meal Prep;Yard Work    Stability/Clinical Decision Making Unstable/Unpredictable    Rehab Potential Good    PT Frequency 2x / week    PT Duration 12 weeks    PT Treatment/Interventions ADLs/Self Care Home  Management;Aquatic Therapy;Canalith Repostioning;Cryotherapy;Electrical Stimulation;Iontophoresis 17m/ml Dexamethasone;Moist Heat;Traction;Ultrasound;Gait training;Stair training;Therapeutic activities;Therapeutic exercise;Neuromuscular re-education;Manual techniques;Dry needling;Vestibular;Spinal Manipulations;Joint Manipulations    PT Next Visit Plan strengthening, practice gait training with lofstrand crutches  PT Home Exercise Plan Access Code: [email or web address]    Consulted and Agree with Plan of Care Patient                Patient will benefit from skilled therapeutic intervention in order to improve the following deficits and impairments:  Abnormal gait, Decreased strength, Difficulty walking  Visit Diagnosis: Muscle weakness (generalized)  Difficulty in walking, not elsewhere classified     Problem List Patient Active Problem List   Diagnosis Date Noted   Muscle spasm 09/09/2020   S/P AKA (above knee amputation), right ([place]) 09/08/2020   Ischemia of extremity 07/02/2020   Chronic pain in right shoulder 11/25/2019   Encounter for long-term (current) use of high-risk medication 07/16/2019   GCA (giant cell arteritis) ([place]) 07/16/2019   Temporal arteritis ([place]) 07/13/2019   Postoperative wound infection 06/23/2019   Ischemic leg 05/31/2019   Atherosclerosis of native arteries of extremity with intermittent claudication ([place]) 05/30/2019   Depression 05/29/2019   Eczema of lower extremity 03/04/2018   Chronic venous insufficiency 03/02/2018   Personal history of colonic polyps    Family history of colonic polyps    Benign neoplasm of ascending colon    Mitral valve insufficiency 02/08/2017   Dyspnea on exertion 02/07/2017   Non-rheumatic mitral regurgitation 02/07/2017   Precordial pain 02/07/2017   CAD (coronary artery disease) 12/21/2016   Essential hypertension 12/21/2016   Compression fracture of lumbar spine, non-traumatic ([place]) 04/26/2016   Compression fracture of  L3 lumbar vertebra 04/20/2016   PVD (peripheral vascular disease) ([place]) 02/24/2016   Family history of premature CAD 02/24/2016   Gastritis    Other specified diseases of esophagus    Hiatal hernia    Gastritis and gastroduodenitis    Ischemia of lower extremity    Arterial occlusion ([facility])    Atherosclerosis of aorta ([facility])    History of smoking    Hyperlipidemia    Pain in the chest    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 02/05/2016   Carotid artery stenosis 01/04/2016   Vertigo, benign paroxysmal 12/28/2015   Clinical depression 09/06/2015   History of alcoholism ([place]) 09/06/2015   Angiopathy, 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[place]) 09/06/2015   Atherosclerosis of native arteries of extremity with rest pain ([place]) 09/06/2015   Acute non-recurrent maxillary sinusitis 07/14/2015   Vaginal pruritus 05/26/2015   Chronic recurrent major depressive disorder ([place]) 03/09/2015   Osteoporosis, post-menopausal 03/09/2015   Peripheral blood vessel disorder ([place]) 03/09/2015   Acid reflux 03/09/2015   GERD (gastroesophageal reflux disease) 12/21/2014   Carotid artery narrowing 01/28/2014   Peripheral arterial occlusive disease ([place]) 06/08/2011   Occlusion and stenosis of unspecified carotid artery 06/08/2011   PAD (peripheral artery disease) ([place]) 06/08/2011   Lyndel Safe Westin Knotts PT, DPT, GCS  Beckem Tomberlin 05/14/2021, 2:40 PM  [facility] [address] [place], [place], [address] Phone: [phone]   Fax:  [phone]  Name: Sara [name] MRN: [id number] Date of Birth: [birth year]

## 2021-05-18 ENCOUNTER — Ambulatory Visit: Payer: Medicare Other

## 2021-05-18 ENCOUNTER — Other Ambulatory Visit: Payer: Self-pay

## 2021-05-18 DIAGNOSIS — M6281 Muscle weakness (generalized): Secondary | ICD-10-CM

## 2021-05-18 DIAGNOSIS — R262 Difficulty in walking, not elsewhere classified: Secondary | ICD-10-CM

## 2021-05-18 NOTE — Therapy (Signed)
[facility] [address] [place], [place], [address] Phone: [phone]   Fax:  [phone]  Physical Therapy Treatment  Patient Details  Name: Sara [name] MRN: [id number] Date of Birth: 1950-10-04 Referring Provider (PT): Dr. Delana Meyer   Encounter Date: 05/18/2021   PT End of Session - 05/18/21 1039     Visit Number 32    Number of Visits 71    Date for PT Re-Evaluation 07/13/21    Authorization Type eval: 01/26/21    PT Start Time 1015    PT Stop Time 1100    PT Time Calculation (min) 45 min    Equipment Utilized During Treatment Gait belt    Activity Tolerance Patient tolerated treatment well    Behavior During Therapy [facility] for tasks assessed/performed                Past Medical History:  Diagnosis Date   Arthritis    right shoulder   Depression    Dyspnea    GERD (gastroesophageal reflux disease)    History of blood clots    Hyperlipidemia    Hypertension    Mild mitral regurgitation    Osteoporosis    Peripheral vascular disease ([place])    Stomach ulcer    Vascular disease    Sees Dr. Delana Meyer   Vertigo    Last episode approx Aug 2015   Wears dentures    full upper    Past Surgical History:  Procedure Laterality Date   ADENOIDECTOMY     AMPUTATION Right 08/11/2020   Procedure: AMPUTATION ABOVE KNEE;  Surgeon: [name], MD;  Location: [facility];  Service: Vascular;  Laterality: Right;   ARTERY BIOPSY Right 07/14/2019   Procedure: BIOPSY TEMPORAL ARTERY;  Surgeon: [name], MD;  Location: [facility];  Service: Vascular;  Laterality: Right;   BREAST CYST ASPIRATION Left    CARDIAC CATHETERIZATION  02/15/2017   [facility]   COLONOSCOPY WITH PROPOFOL N/A 10/22/2017   Procedure: COLONOSCOPY WITH PROPOFOL;  Surgeon: Lucilla Lame, MD;  Location: [place];  Service: Endoscopy;  Laterality: N/A;  specimens not taken--pt on Plavix will be brought back in after 7 days off med   COLONOSCOPY  WITH PROPOFOL N/A 11/05/2017   Procedure: COLONOSCOPY WITH PROPOFOL;  Surgeon: Lucilla Lame, MD;  Location: [place];  Service: Endoscopy;  Laterality: N/A;   ESOPHAGOGASTRODUODENOSCOPY N/A 12/21/2014   Procedure: ESOPHAGOGASTRODUODENOSCOPY (EGD);  Surgeon: Lucilla Lame, MD;  Location: [place];  Service: Gastroenterology;  Laterality: N/A;   ESOPHAGOGASTRODUODENOSCOPY (EGD) WITH PROPOFOL N/A 02/08/2016   Procedure: ESOPHAGOGASTRODUODENOSCOPY (EGD) WITH PROPOFOL;  Surgeon: Lucilla Lame, MD;  Location: [facility];  Service: Endoscopy;  Laterality: N/A;   FASCIOTOMY Right 05/30/2019   Procedure: FASCIOTOMY;  Surgeon: [name], MD;  Location: [facility];  Service: Vascular;  Laterality: Right;   FASCIOTOMY CLOSURE Right 06/04/2019   Procedure: FASCIOTOMY CLOSURE;  Surgeon: [name], MD;  Location: [facility];  Service: Vascular;  Laterality: Right;   HEMORROIDECTOMY  2014   LOWER EXTREMITY ANGIOGRAPHY Right 05/30/2019   Procedure: LOWER EXTREMITY ANGIOGRAPHY;  Surgeon: [name], MD;  Location: [place] [facility];  Service: Cardiovascular;  Laterality: Right;   LOWER EXTREMITY ANGIOGRAPHY Right 07/02/2020   Procedure: LOWER EXTREMITY ANGIOGRAPHY;  Surgeon: [name], MD;  Location: [place] [facility];  Service: Cardiovascular;  Laterality: Right;   PERIPHERAL VASCULAR CATHETERIZATION N/A 02/09/2016   Procedure: Abdominal Aortogram w/Lower Extremity;  Surgeon: [name], MD;  Location: [place] [facility];  Service: Cardiovascular;  Laterality: N/A;   PERIPHERAL VASCULAR CATHETERIZATION Right 02/10/2016   Procedure: Lower Extremity Angiography;  Surgeon: [name], MD;  Location: [place] [facility];  Service: Cardiovascular;  Laterality: Right;   POLYPECTOMY  11/05/2017   Procedure: POLYPECTOMY;  Surgeon: Lucilla Lame, MD;  Location: [place];  Service: Endoscopy;;   SHOULDER ARTHROSCOPY WITH ROTATOR CUFF REPAIR AND  SUBACROMIAL DECOMPRESSION Right 02/27/2020   Procedure: RIGHT SHOULDER ARTHROSCOPY SUBACROMIAL DECOMPRESSION, DISTAL CLAVICLE EXCISION AND MINI-OPEN ROTATOR CUFF REPAIR;  Surgeon: Thornton Park, MD;  Location: [facility];  Service: Orthopedics;  Laterality: Right;   TONSILLECTOMY     VASCULAR SURGERY  2947,[id number]   Fem-Pop Bypass    There were no vitals filed for this visit.    Subjective Assessment - 05/18/21 1036     Subjective Pt states that she is feeling well today. No complaints of stomach pain upon arrival. Phantom limb pain continues. RLE residual pain from last week has resolved. Pt denies falls since last therapy session. No specific questions or concerns currently.    Pertinent History Pt has a history of RLE vascular issues since 1991. 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 She received her RLE prosthetic in April 2022 and has been receiving [facility] since her surgery. They discharged her yesterday to continue with OP PT. She continues with phantom RLE pain, especially at night and takes gabapentin with minimal relief. She has also been having R shoulder pain since the surgery which she attributes to using a wheelchair. She underwent R RTC surgery in September of 2021 and had physical therapy afterwards. She has been having issues with bilateral carpal tunnel and has a NCV study scheduled for tomorrow.    Limitations Walking    Patient Stated Goals Ambulate with her prosthetic               TREATMENT   Neuromuscular Re-education  NuStep L1 x 5 minutes for warm-up during interval history;  All balance exercises performed in // bars without UE support unless otherwise noted: Forward/backward gait x 2 lengths each direction; Side stepping x 2 lengths each direction; Alternating 6" step taps x 10 BLE; 6" forward step-ups leading with LLE up and RLE down x 10;  Hurdle stepping in // bars with 6" hurdles x 3 forward x 4 lengths and  lateral x 4 lengths;  Mirror therapy performed with patient sitting at edge of mat table and mirror between legs to simulate right lower extremity.  Patient performed seated marches x 10, long arc quads with 3# ankle weights x 10, L ankle AROM plantar flexion/dorsiflexion/circles, L ankle ball circles CW/CCW x 10 each;   Pt educated throughout session about proper posture and technique with exercises. Improved exercise technique, movement at target joints, use of target muscles after min to mod verbal, visual, tactile cues.      Patient demonstrates excellent motivation during session today. She continues to report phantom limb pain today so repeated mirror therapy during session. Residual RLE pain has resolved since last session. Continued to challenge balance in parallel bars without UE support and she is still very easily fatigued with impaired RLE single leg balance. Patient encouraged to continue HEP and follow-up as scheduled.  She will benefit from PT services to address deficits in strength, balance, and mobility in order to return to full function at home.  PT Short Term Goals - 05/11/21 1000       PT SHORT TERM GOAL #1   Title Pt will be independent with HEP in order to improve strength and balance in order to decrease fall risk and improve function at home.    Time 6    Period Weeks    Status On-going    Target Date 06/01/21               PT Long Term Goals - 05/11/21 1000       PT LONG TERM GOAL #1   Title Pt will improve BERG to at least 52/56 in order to demonstrate clinically significant improvement in balance and decrease risk for falls;    Baseline 01/26/21: 45/56; 03/02/21: 47/56; 04/20/21: 49/56    Time 12    Period Weeks    Status Partially Met    Target Date 07/13/21      PT LONG TERM GOAL #2   Title Pt will improve ABC by at least 13% in order to demonstrate clinically significant improvement in balance  confidence.    Baseline 01/26/21: 57.5%; 03/02/21: 89.4%    Time 8    Period Weeks    Status Achieved      PT LONG TERM GOAL #3   Title Pt will improve FOTO to at least 63 in order to demonstrate clinically significant improvement in function related to her RLE amputation    Baseline 01/26/21: 59; 03/02/21: 71; 04/06/21: 71    Time 8    Period Weeks    Status Achieved      PT LONG TERM GOAL #4   Title Pt will increase self-selected 10MWT to at least 0.6 m/s in order to demonstrate clinically significant improvement in community ambulation.    Baseline 01/26/21: 31.5s = 0.32 m/s; 03/02/21: 25.5s = 0.39 m/s; 04/06/21: 20.7s = 0.48 m/s    Time 8    Period Weeks    Status Partially Met    Target Date 07/13/21                   Plan - 05/18/21 1040     Clinical Impression Statement Patient demonstrates excellent motivation during session today. She continues to report phantom limb pain today so repeated mirror therapy during session. Residual RLE pain has resolved since last session. Continued to challenge balance in parallel bars without UE support and she is still very easily fatigued with impaired RLE single leg balance. Patient encouraged to continue HEP and follow-up as scheduled.  She will benefit from PT services to address deficits in strength, balance, and mobility in order to return to full function at home.    Personal Factors and Comorbidities Age;Comorbidity 3+    Comorbidities Depression, osteoporosis, vascular disease    Examination-Activity Limitations Bathing;Locomotion Level;Squat;Stairs;Stand;Transfers    Examination-Participation Restrictions Cleaning;Community Activity;Meal Prep;Yard Work    Stability/Clinical Decision Making Unstable/Unpredictable    Rehab Potential Good    PT Frequency 2x / week    PT Duration 12 weeks    PT Treatment/Interventions ADLs/Self Care Home Management;Aquatic Therapy;Canalith Repostioning;Cryotherapy;Electrical Stimulation;Iontophoresis  87m/ml Dexamethasone;Moist Heat;Traction;Ultrasound;Gait training;Stair training;Therapeutic activities;Therapeutic exercise;Neuromuscular re-education;Manual techniques;Dry needling;Vestibular;Spinal Manipulations;Joint Manipulations    PT Next Visit Plan strengthening, practice gait training with lofstrand crutches    PT Home Exercise Plan Access Code: [email or web address]    Consulted and Agree with Plan of Care Patient                Patient will benefit from skilled therapeutic  intervention in order to improve the following deficits and impairments:  Abnormal gait, Decreased strength, Difficulty walking  Visit Diagnosis: Muscle weakness (generalized)  Difficulty in walking, not elsewhere classified     Problem List Patient Active Problem List   Diagnosis Date Noted   Muscle spasm 09/09/2020   S/P AKA (above knee amputation), right ([place]) 09/08/2020   Ischemia of extremity 07/02/2020   Chronic pain in right shoulder 11/25/2019   Encounter for long-term (current) use of high-risk medication 07/16/2019   GCA (giant cell arteritis) ([place]) 07/16/2019   Temporal arteritis ([place]) 07/13/2019   Postoperative wound infection 06/23/2019   Ischemic leg 05/31/2019   Atherosclerosis of native arteries of extremity with intermittent claudication ([place]) 05/30/2019   Depression 05/29/2019   Eczema of lower extremity 03/04/2018   Chronic venous insufficiency 03/02/2018   Personal history of colonic polyps    Family history of colonic polyps    Benign neoplasm of ascending colon    Mitral valve insufficiency 02/08/2017   Dyspnea on exertion 02/07/2017   Non-rheumatic mitral regurgitation 02/07/2017   Precordial pain 02/07/2017   CAD (coronary artery disease) 12/21/2016   Essential hypertension 12/21/2016   Compression fracture of lumbar spine, non-traumatic ([place]) 04/26/2016   Compression fracture of L3 lumbar vertebra 04/20/2016   PVD (peripheral vascular disease) ([place]) 02/24/2016   Family  history of premature CAD 02/24/2016   Gastritis    Other specified diseases of esophagus    Hiatal hernia    Gastritis and gastroduodenitis    Ischemia of lower extremity    Arterial occlusion ([facility])    Atherosclerosis of aorta ([place])    History of smoking    Hyperlipidemia    Pain in the chest    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 02/05/2016   Carotid artery stenosis 01/04/2016   Vertigo, benign paroxysmal 12/28/2015   Clinical depression 09/06/2015   History of alcoholism ([place]) 09/06/2015   Angiopathy, 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[place]) 09/06/2015   Atherosclerosis of native arteries of extremity with rest pain ([place]) 09/06/2015   Acute non-recurrent maxillary sinusitis 07/14/2015   Vaginal pruritus 05/26/2015   Chronic recurrent major depressive disorder ([place]) 03/09/2015   Osteoporosis, post-menopausal 03/09/2015   Peripheral blood vessel disorder ([place]) 03/09/2015   Acid reflux 03/09/2015   GERD (gastroesophageal reflux disease) 12/21/2014   Carotid artery narrowing 01/28/2014   Peripheral arterial occlusive disease ([place]) 06/08/2011   Occlusion and stenosis of unspecified carotid artery 06/08/2011   PAD (peripheral artery disease) ([place]) 06/08/2011   Lyndel Safe Raia Amico PT, DPT, GCS  Jonesha Tsuchiya 05/18/2021, 1:32 PM  [place] [facility] [address] [place], [place], [address] Phone: [phone]   Fax:  [phone]  Name: Sara [name] MRN: [id number] Date of Birth: Sep 27, 1950

## 2021-05-20 ENCOUNTER — Ambulatory Visit: Payer: Medicare Other

## 2021-05-20 ENCOUNTER — Other Ambulatory Visit: Payer: Self-pay

## 2021-05-20 DIAGNOSIS — M6281 Muscle weakness (generalized): Secondary | ICD-10-CM | POA: Diagnosis not present

## 2021-05-20 DIAGNOSIS — R262 Difficulty in walking, not elsewhere classified: Secondary | ICD-10-CM

## 2021-05-20 DIAGNOSIS — R2681 Unsteadiness on feet: Secondary | ICD-10-CM

## 2021-05-20 NOTE — Therapy (Signed)
[facility] [address] [place], [place], [address] Phone: [phone]   Fax:  [phone]  Physical Therapy Treatment  Patient Details  Name: Sara [name] MRN: [id number] Date of Birth: 1950/11/24 Referring Provider (PT): Dr. Delana Meyer   Encounter Date: 05/20/2021   PT End of Session - 05/20/21 1032     Visit Number 33    Number of Visits 8    Date for PT Re-Evaluation 07/13/21    Authorization Type eval: 01/26/21    PT Start Time 1020    PT Stop Time 1105    PT Time Calculation (min) 45 min    Equipment Utilized During Treatment Gait belt    Activity Tolerance Patient tolerated treatment well    Behavior During Therapy [facility] for tasks assessed/performed                Past Medical History:  Diagnosis Date   Arthritis    right shoulder   Depression    Dyspnea    GERD (gastroesophageal reflux disease)    History of blood clots    Hyperlipidemia    Hypertension    Mild mitral regurgitation    Osteoporosis    Peripheral vascular disease ([place])    Stomach ulcer    Vascular disease    Sees Dr. Delana Meyer   Vertigo    Last episode approx Aug 2015   Wears dentures    full upper    Past Surgical History:  Procedure Laterality Date   ADENOIDECTOMY     AMPUTATION Right 08/11/2020   Procedure: AMPUTATION ABOVE KNEE;  Surgeon: [name], MD;  Location: [facility];  Service: Vascular;  Laterality: Right;   ARTERY BIOPSY Right 07/14/2019   Procedure: BIOPSY TEMPORAL ARTERY;  Surgeon: [name], MD;  Location: [facility];  Service: Vascular;  Laterality: Right;   BREAST CYST ASPIRATION Left    CARDIAC CATHETERIZATION  02/15/2017   [facility]   COLONOSCOPY WITH PROPOFOL N/A 10/22/2017   Procedure: COLONOSCOPY WITH PROPOFOL;  Surgeon: Lucilla Lame, MD;  Location: [place];  Service: Endoscopy;  Laterality: N/A;  specimens not taken--pt on Plavix will be brought back in after 7 days off med   COLONOSCOPY  WITH PROPOFOL N/A 11/05/2017   Procedure: COLONOSCOPY WITH PROPOFOL;  Surgeon: Lucilla Lame, MD;  Location: [place];  Service: Endoscopy;  Laterality: N/A;   ESOPHAGOGASTRODUODENOSCOPY N/A 12/21/2014   Procedure: ESOPHAGOGASTRODUODENOSCOPY (EGD);  Surgeon: Lucilla Lame, MD;  Location: [place];  Service: Gastroenterology;  Laterality: N/A;   ESOPHAGOGASTRODUODENOSCOPY (EGD) WITH PROPOFOL N/A 02/08/2016   Procedure: ESOPHAGOGASTRODUODENOSCOPY (EGD) WITH PROPOFOL;  Surgeon: Lucilla Lame, MD;  Location: [facility];  Service: Endoscopy;  Laterality: N/A;   FASCIOTOMY Right 05/30/2019   Procedure: FASCIOTOMY;  Surgeon: [name], MD;  Location: [facility];  Service: Vascular;  Laterality: Right;   FASCIOTOMY CLOSURE Right 06/04/2019   Procedure: FASCIOTOMY CLOSURE;  Surgeon: [name], MD;  Location: [facility];  Service: Vascular;  Laterality: Right;   HEMORROIDECTOMY  2014   LOWER EXTREMITY ANGIOGRAPHY Right 05/30/2019   Procedure: LOWER EXTREMITY ANGIOGRAPHY;  Surgeon: [name], MD;  Location: [place] [facility];  Service: Cardiovascular;  Laterality: Right;   LOWER EXTREMITY ANGIOGRAPHY Right 07/02/2020   Procedure: LOWER EXTREMITY ANGIOGRAPHY;  Surgeon: [name], MD;  Location: [place] [facility];  Service: Cardiovascular;  Laterality: Right;   PERIPHERAL VASCULAR CATHETERIZATION N/A 02/09/2016   Procedure: Abdominal Aortogram w/Lower Extremity;  Surgeon: [name], MD;  Location: [place] [facility];  Service: Cardiovascular;  Laterality: N/A;   PERIPHERAL VASCULAR CATHETERIZATION Right 02/10/2016   Procedure: Lower Extremity Angiography;  Surgeon: [name], MD;  Location: [place] [facility];  Service: Cardiovascular;  Laterality: Right;   POLYPECTOMY  11/05/2017   Procedure: POLYPECTOMY;  Surgeon: Lucilla Lame, MD;  Location: [place];  Service: Endoscopy;;   SHOULDER ARTHROSCOPY WITH ROTATOR CUFF REPAIR AND  SUBACROMIAL DECOMPRESSION Right 02/27/2020   Procedure: RIGHT SHOULDER ARTHROSCOPY SUBACROMIAL DECOMPRESSION, DISTAL CLAVICLE EXCISION AND MINI-OPEN ROTATOR CUFF REPAIR;  Surgeon: Thornton Park, MD;  Location: [facility];  Service: Orthopedics;  Laterality: Right;   TONSILLECTOMY     VASCULAR SURGERY  [id number]   Fem-Pop Bypass    There were no vitals filed for this visit.    Subjective Assessment - 05/20/21 1005     Subjective Pt states that she is feeling well today. No complaints of stomach pain upon arrival. Phantom limb pain continues but nothing currently. Also no RLE residual pain currently. No specific questions or concerns currently.    Pertinent History Pt has a history of RLE vascular issues since 1991. 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 She received her RLE prosthetic in April 2022 and has been receiving [facility] since her surgery. They discharged her yesterday to continue with OP PT. She continues with phantom RLE pain, especially at night and takes gabapentin with minimal relief. She has also been having R shoulder pain since the surgery which she attributes to using a wheelchair. She underwent R RTC surgery in September of 2021 and had physical therapy afterwards. She has been having issues with bilateral carpal tunnel and has a NCV study scheduled for tomorrow.    Limitations Walking    Patient Stated Goals Ambulate with her prosthetic               TREATMENT   Neuromuscular Re-education  NuStep L2 x 5 minutes BLE only for warm-up during interval history;  All balance exercises performed in // bars without UE support unless otherwise noted: Forward/backward gait x 3 lengths each direction; Side stepping x 3 lengths each direction; Airex feet apart eyes open/closed x 30s each; Airex feet apart ball passes around body with both arm x multiple bouts to each side; Airex alternating 6" step taps x 10 BLE; Airex staggered stance  balance with front foot on 6" step; Airex feet together balance x 30s; Airex feet together ball passes around body with both arm x multiple bouts to each side;  Mirror therapy performed with patient sitting at edge of mat table and mirror between legs to simulate right lower extremity.  While watching the mirror with LLE patient performed seated marches x 1 minute with 3# ankle weight, long arc quads with 3# ankle weight x 1 minute, L ankle AROM plantar flexion/dorsiflexion/circles x 1 minute, L ankle ball circles CW/CCW x 1 minute;   Pt educated throughout session about proper posture and technique with exercises. Improved exercise technique, movement at target joints, use of target muscles after min to mod verbal, visual, tactile cues.      Patient demonstrates excellent motivation during session today. She continues to report phantom limb pain today so repeated mirror therapy during session. Continued to challenge balance in parallel bars without UE support and she is still very easily fatigued with impaired RLE single leg balance. Utilized Airex pad for dynamic challenge today. Patient  encouraged to continue HEP and follow-up as scheduled.  She will benefit from PT services to address deficits in strength, balance, and mobility in order to return to full function at home.                            PT Short Term Goals - 05/11/21 1000       PT SHORT TERM GOAL #1   Title Pt will be independent with HEP in order to improve strength and balance in order to decrease fall risk and improve function at home.    Time 6    Period Weeks    Status On-going    Target Date 06/01/21               PT Long Term Goals - 05/11/21 1000       PT LONG TERM GOAL #1   Title Pt will improve BERG to at least 52/56 in order to demonstrate clinically significant improvement in balance and decrease risk for falls;    Baseline 01/26/21: 45/56; 03/02/21: 47/56; 04/20/21: 49/56    Time 12     Period Weeks    Status Partially Met    Target Date 07/13/21      PT LONG TERM GOAL #2   Title Pt will improve ABC by at least 13% in order to demonstrate clinically significant improvement in balance confidence.    Baseline 01/26/21: 57.5%; 03/02/21: 89.4%    Time 8    Period Weeks    Status Achieved      PT LONG TERM GOAL #3   Title Pt will improve FOTO to at least 63 in order to demonstrate clinically significant improvement in function related to her RLE amputation    Baseline 01/26/21: 59; 03/02/21: 71; 04/06/21: 71    Time 8    Period Weeks    Status Achieved      PT LONG TERM GOAL #4   Title Pt will increase self-selected 10MWT to at least 0.6 m/s in order to demonstrate clinically significant improvement in community ambulation.    Baseline 01/26/21: 31.5s = 0.32 m/s; 03/02/21: 25.5s = 0.39 m/s; 04/06/21: 20.7s = 0.48 m/s    Time 8    Period Weeks    Status Partially Met    Target Date 07/13/21                   Plan - 05/20/21 1033     Clinical Impression Statement Patient demonstrates excellent motivation during session today. She continues to report phantom limb pain today so repeated mirror therapy during session. Continued to challenge balance in parallel bars without UE support and she is still very easily fatigued with impaired RLE single leg balance. Utilized Airex pad for dynamic challenge today. Patient encouraged to continue HEP and follow-up as scheduled.  She will benefit from PT services to address deficits in strength, balance, and mobility in order to return to full function at home.    Personal Factors and Comorbidities Age;Comorbidity 3+    Comorbidities Depression, osteoporosis, vascular disease    Examination-Activity Limitations Bathing;Locomotion Level;Squat;Stairs;Stand;Transfers    Examination-Participation Restrictions Cleaning;Community Activity;Meal Prep;Yard Work    Stability/Clinical Decision Making Unstable/Unpredictable    Rehab Potential  Good    PT Frequency 2x / week    PT Duration 12 weeks    PT Treatment/Interventions ADLs/Self Care Home Management;Aquatic Therapy;Canalith Repostioning;Cryotherapy;Electrical Stimulation;Iontophoresis 68m/ml Dexamethasone;Moist Heat;Traction;Ultrasound;Gait training;Stair training;Therapeutic activities;Therapeutic exercise;Neuromuscular re-education;Manual techniques;Dry needling;Vestibular;Spinal  Manipulations;Joint Manipulations    PT Next Visit Plan strengthening, practice gait training with lofstrand crutches    PT Home Exercise Plan Access Code: [email or web address]    Consulted and Agree with Plan of Care Patient                Patient will benefit from skilled therapeutic intervention in order to improve the following deficits and impairments:  Abnormal gait, Decreased strength, Difficulty walking  Visit Diagnosis: Muscle weakness (generalized)  Difficulty in walking, not elsewhere classified  Unsteadiness on feet     Problem List Patient Active Problem List   Diagnosis Date Noted   Muscle spasm 09/09/2020   S/P AKA (above knee amputation), right ([place]) 09/08/2020   Ischemia of extremity 07/02/2020   Chronic pain in right shoulder 11/25/2019   Encounter for long-term (current) use of high-risk medication 07/16/2019   GCA (giant cell arteritis) ([place]) 07/16/2019   Temporal arteritis ([place]) 07/13/2019   Postoperative wound infection 06/23/2019   Ischemic leg 05/31/2019   Atherosclerosis of native arteries of extremity with intermittent claudication ([place]) 05/30/2019   Depression 05/29/2019   Eczema of lower extremity 03/04/2018   Chronic venous insufficiency 03/02/2018   Personal history of colonic polyps    Family history of colonic polyps    Benign neoplasm of ascending colon    Mitral valve insufficiency 02/08/2017   Dyspnea on exertion 02/07/2017   Non-rheumatic mitral regurgitation 02/07/2017   Precordial pain 02/07/2017   CAD (coronary artery disease) 12/21/2016    Essential hypertension 12/21/2016   Compression fracture of lumbar spine, non-traumatic ([place]) 04/26/2016   Compression fracture of L3 lumbar vertebra 04/20/2016   PVD (peripheral vascular disease) ([place]) 02/24/2016   Family history of premature CAD 02/24/2016   Gastritis    Other specified diseases of esophagus    Hiatal hernia    Gastritis and gastroduodenitis    Ischemia of lower extremity    Arterial occlusion (H[facility])    Atherosclerosis of aorta ([place])    History of smoking    Hyperlipidemia    Pain in the chest    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 02/05/2016   Carotid artery stenosis 01/04/2016   Vertigo, benign paroxysmal 12/28/2015   Clinical depression 09/06/2015   History of alcoholism ([place]) 09/06/2015   Angiopathy, 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[place]) 09/06/2015   Atherosclerosis of native arteries of extremity with rest pain ([place]) 09/06/2015   Acute non-recurrent maxillary sinusitis 07/14/2015   Vaginal pruritus 05/26/2015   Chronic recurrent major depressive disorder ([place]) 03/09/2015   Osteoporosis, post-menopausal 03/09/2015   Peripheral blood vessel disorder ([place]) 03/09/2015   Acid reflux 03/09/2015   GERD (gastroesophageal reflux disease) 12/21/2014   Carotid artery narrowing 01/28/2014   Peripheral arterial occlusive disease ([place]) 06/08/2011   Occlusion and stenosis of unspecified carotid artery 06/08/2011   PAD (peripheral artery disease) ([place]) 06/08/2011   Phillips Grout PT, DPT, GCS  Rhen Dossantos 05/20/2021, 4:38 PM   [facility] [address] [place], [place], [address] Phone: [phone]   Fax:  [phone]  Name: Sara [name] MRN: [id number] Date of Birth: 12/31/1950

## 2021-05-25 ENCOUNTER — Ambulatory Visit: Payer: Medicare Other | Attending: Vascular Surgery

## 2021-05-25 ENCOUNTER — Other Ambulatory Visit: Payer: Self-pay

## 2021-05-25 DIAGNOSIS — R2681 Unsteadiness on feet: Secondary | ICD-10-CM | POA: Insufficient documentation

## 2021-05-25 DIAGNOSIS — M6281 Muscle weakness (generalized): Secondary | ICD-10-CM | POA: Diagnosis present

## 2021-05-25 DIAGNOSIS — G546 Phantom limb syndrome with pain: Secondary | ICD-10-CM | POA: Insufficient documentation

## 2021-05-25 DIAGNOSIS — R262 Difficulty in walking, not elsewhere classified: Secondary | ICD-10-CM | POA: Diagnosis present

## 2021-05-25 NOTE — Therapy (Signed)
[facility] [address] [place], [place], [address] Phone: [phone]   Fax:  [phone]  Physical Therapy Treatment  Patient Details  Name: ERNESTENE COOVER MRN: [id number] Date of Birth: 1951-04-16 Referring Provider (PT): Dr. Delana Meyer   Encounter Date: 05/25/2021   PT End of Session - 05/25/21 0951     Visit Number 34    Number of Visits 72    Date for PT Re-Evaluation 07/13/21    Authorization Type eval: 01/26/21    PT Start Time 0932    PT Stop Time 1015    PT Time Calculation (min) 43 min    Equipment Utilized During Treatment Gait belt    Activity Tolerance Patient tolerated treatment well    Behavior During Therapy [facility] for tasks assessed/performed             Past Medical History:  Diagnosis Date   Arthritis    right shoulder   Depression    Dyspnea    GERD (gastroesophageal reflux disease)    History of blood clots    Hyperlipidemia    Hypertension    Mild mitral regurgitation    Osteoporosis    Peripheral vascular disease ([place])    Stomach ulcer    Vascular disease    Sees Dr. Delana Meyer   Vertigo    Last episode approx Aug 2015   Wears dentures    full upper    Past Surgical History:  Procedure Laterality Date   ADENOIDECTOMY     AMPUTATION Right 08/11/2020   Procedure: AMPUTATION ABOVE KNEE;  Surgeon: [name], MD;  Location: [facility];  Service: Vascular;  Laterality: Right;   ARTERY BIOPSY Right 07/14/2019   Procedure: BIOPSY TEMPORAL ARTERY;  Surgeon: [name], MD;  Location: [facility];  Service: Vascular;  Laterality: Right;   BREAST CYST ASPIRATION Left    CARDIAC CATHETERIZATION  02/15/2017   [facility]   COLONOSCOPY WITH PROPOFOL N/A 10/22/2017   Procedure: COLONOSCOPY WITH PROPOFOL;  Surgeon: Lucilla Lame, MD;  Location: [place];  Service: Endoscopy;  Laterality: N/A;  specimens not taken--pt on Plavix will be brought back in after 7 days off med   COLONOSCOPY WITH  PROPOFOL N/A 11/05/2017   Procedure: COLONOSCOPY WITH PROPOFOL;  Surgeon: Lucilla Lame, MD;  Location: [place];  Service: Endoscopy;  Laterality: N/A;   ESOPHAGOGASTRODUODENOSCOPY N/A 12/21/2014   Procedure: ESOPHAGOGASTRODUODENOSCOPY (EGD);  Surgeon: Lucilla Lame, MD;  Location: [place];  Service: Gastroenterology;  Laterality: N/A;   ESOPHAGOGASTRODUODENOSCOPY (EGD) WITH PROPOFOL N/A 02/08/2016   Procedure: ESOPHAGOGASTRODUODENOSCOPY (EGD) WITH PROPOFOL;  Surgeon: Lucilla Lame, MD;  Location: [facility];  Service: Endoscopy;  Laterality: N/A;   FASCIOTOMY Right 05/30/2019   Procedure: FASCIOTOMY;  Surgeon: [name], MD;  Location: [facility];  Service: Vascular;  Laterality: Right;   FASCIOTOMY CLOSURE Right 06/04/2019   Procedure: FASCIOTOMY CLOSURE;  Surgeon: [name], MD;  Location: [facility];  Service: Vascular;  Laterality: Right;   HEMORROIDECTOMY  2014   LOWER EXTREMITY ANGIOGRAPHY Right 05/30/2019   Procedure: LOWER EXTREMITY ANGIOGRAPHY;  Surgeon: [name], MD;  Location: [place] [facility];  Service: Cardiovascular;  Laterality: Right;   LOWER EXTREMITY ANGIOGRAPHY Right 07/02/2020   Procedure: LOWER EXTREMITY ANGIOGRAPHY;  Surgeon: [name], MD;  Location: [place] [facility];  Service: Cardiovascular;  Laterality: Right;   PERIPHERAL VASCULAR CATHETERIZATION N/A 02/09/2016   Procedure: Abdominal Aortogram w/Lower Extremity;  Surgeon: Belenda Cruise  Eloise Levels, MD;  Location: [place] [facility];  Service: Cardiovascular;  Laterality: N/A;   PERIPHERAL VASCULAR CATHETERIZATION Right 02/10/2016   Procedure: Lower Extremity Angiography;  Surgeon: [name], MD;  Location: [place] [facility];  Service: Cardiovascular;  Laterality: Right;   POLYPECTOMY  11/05/2017   Procedure: POLYPECTOMY;  Surgeon: Lucilla Lame, MD;  Location: [place];  Service: Endoscopy;;   SHOULDER ARTHROSCOPY WITH ROTATOR CUFF REPAIR AND  SUBACROMIAL DECOMPRESSION Right 02/27/2020   Procedure: RIGHT SHOULDER ARTHROSCOPY SUBACROMIAL DECOMPRESSION, DISTAL CLAVICLE EXCISION AND MINI-OPEN ROTATOR CUFF REPAIR;  Surgeon: Thornton Park, MD;  Location: [facility];  Service: Orthopedics;  Laterality: Right;   TONSILLECTOMY     VASCULAR SURGERY  [phone]   Fem-Pop Bypass    There were no vitals filed for this visit.   Subjective Assessment - 05/25/21 0935     Subjective Pt reports RLE pain as a 6/10 NPS from phantom limb pain. Reports practicing ambulating with no AD at home and having a fall onto coutch. Denies injury from fall.    Currently in Pain? Yes    Pain Location Leg    Pain Orientation Right    Pain Type Chronic pain    Pain Onset More than a month ago    Pain Frequency Intermittent            Neuro Re-Ed:  All balance exercises performed in // bars without UE support unless otherwise noted: Forward/backward gait x 6 lengths each direction; Side stepping x 6 lengths each direction; Airex pad exercises:   Feet apart eyes closed: 3x30 sec, CGA. X2 bouts of post LOB requiring PT assist to correct.   Tandem stance LLE on airex pad: 3x30 sec, CGA    6" step ups: 2x10/LE. Multimodal cuing and use of mirror for upright posture utilizing R hip flexor. Intermittent need for LUE support to assist with stance time on RLE. Improved eccentric control     PT Education - 05/25/21 0935     Education Details form/technique with exercise.    Person(s) Educated Patient    Methods Explanation    Comprehension Verbalized understanding              PT Short Term Goals - 05/11/21 1000       PT SHORT TERM GOAL #1   Title Pt will be independent with HEP in order to improve strength and balance in order to decrease fall risk and improve function at home.    Time 6    Period Weeks    Status On-going    Target Date 06/01/21               PT Long Term Goals - 05/11/21 1000       PT LONG TERM GOAL #1   Title Pt  will improve BERG to at least 52/56 in order to demonstrate clinically significant improvement in balance and decrease risk for falls;    Baseline 01/26/21: 45/56; 03/02/21: 47/56; 04/20/21: 49/56    Time 12    Period Weeks    Status Partially Met    Target Date 07/13/21      PT LONG TERM GOAL #2   Title Pt will improve ABC by at least 13% in order to demonstrate clinically significant improvement in balance confidence.    Baseline 01/26/21: 57.5%; 03/02/21: 89.4%    Time 8    Period Weeks    Status Achieved      PT LONG TERM GOAL #3  Title Pt will improve FOTO to at least 63 in order to demonstrate clinically significant improvement in function related to her RLE amputation    Baseline 01/26/21: 59; 03/02/21: 71; 04/06/21: 71    Time 8    Period Weeks    Status Achieved      PT LONG TERM GOAL #4   Title Pt will increase self-selected 10MWT to at least 0.6 m/s in order to demonstrate clinically significant improvement in community ambulation.    Baseline 01/26/21: 31.5s = 0.32 m/s; 03/02/21: 25.5s = 0.39 m/s; 04/06/21: 20.7s = 0.48 m/s    Time 8    Period Weeks    Status Partially Met    Target Date 07/13/21             Patient will benefit from skilled therapeutic intervention in order to improve the following deficits and impairments:  Abnormal gait, Decreased strength, Difficulty walking  Visit Diagnosis: Muscle weakness (generalized)  Difficulty in walking, not elsewhere classified  Unsteadiness on feet  Phantom limb syndrome with pain [facility])     Problem List Patient Active Problem List   Diagnosis Date Noted   Muscle spasm 09/09/2020   S/P AKA (above knee amputation), right ([place]) 09/08/2020   Ischemia of extremity 07/02/2020   Chronic pain in right shoulder 11/25/2019   Encounter for long-term (current) use of high-risk medication 07/16/2019   GCA (giant cell arteritis) ([place]) 07/16/2019   Temporal arteritis ([place]) 07/13/2019   Postoperative wound infection 06/23/2019    Ischemic leg 05/31/2019   Atherosclerosis of native arteries of extremity with intermittent claudication ([place]) 05/30/2019   Depression 05/29/2019   Eczema of lower extremity 03/04/2018   Chronic venous insufficiency 03/02/2018   Personal history of colonic polyps    Family history of colonic polyps    Benign neoplasm of ascending colon    Mitral valve insufficiency 02/08/2017   Dyspnea on exertion 02/07/2017   Non-rheumatic mitral regurgitation 02/07/2017   Precordial pain 02/07/2017   CAD (coronary artery disease) 12/21/2016   Essential hypertension 12/21/2016   Compression fracture of lumbar spine, non-traumatic ([place]) 04/26/2016   Compression fracture of L3 lumbar vertebra 04/20/2016   PVD (peripheral vascular disease) ([place]) 02/24/2016   Family history of premature CAD 02/24/2016   Gastritis    Other specified diseases of esophagus    Hiatal hernia    Gastritis and gastroduodenitis    Ischemia of lower extremity    Arterial occlusion (HCC)    Atherosclerosis of aorta ([place])    History of smoking    Hyperlipidemia    Pain in the chest    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 02/05/2016   Carotid artery stenosis 01/04/2016   Vertigo, benign paroxysmal 12/28/2015   Clinical depression 09/06/2015   History of alcoholism ([place]) 09/06/2015   Angiopathy, 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) 06/08/2011   Occlusion and stenosis of unspecified carotid artery 06/08/2011   PAD (peripheral  artery disease) ([place]) 06/08/2011    Salem Caster. Fairly IV, PT, DPT Physical Therapist- [facility]  05/25/2021,  12:15 PM  [facility] [address] [place], [place], [address] Phone: [phone]   Fax:  [phone]  Name: SORIYA WORSTER MRN: [id number] Date of Birth: 1950/10/21

## 2021-05-27 ENCOUNTER — Other Ambulatory Visit: Payer: Self-pay

## 2021-05-27 ENCOUNTER — Ambulatory Visit: Payer: Medicare Other | Admitting: Physical Therapy

## 2021-05-27 ENCOUNTER — Encounter: Payer: Self-pay | Admitting: Physical Therapy

## 2021-05-27 DIAGNOSIS — R2681 Unsteadiness on feet: Secondary | ICD-10-CM

## 2021-05-27 DIAGNOSIS — R262 Difficulty in walking, not elsewhere classified: Secondary | ICD-10-CM

## 2021-05-27 DIAGNOSIS — M6281 Muscle weakness (generalized): Secondary | ICD-10-CM | POA: Diagnosis not present

## 2021-05-27 DIAGNOSIS — G546 Phantom limb syndrome with pain: Secondary | ICD-10-CM

## 2021-05-27 NOTE — Therapy (Signed)
[facility] [address] [place], [place], [address] Phone: [phone]   Fax:  [phone]  Physical Therapy Treatment  Patient Details  Name: Sara [name] MRN: [id number] Date of Birth: [birth year] Referring Provider (PT): Dr. Delana Meyer   Encounter Date: 05/27/2021   PT End of Session - 05/27/21 1014     Visit Number 35    Number of Visits 76    Date for PT Re-Evaluation 07/13/21    Authorization Type eval: 01/26/21    PT Start Time 1015    PT Stop Time 1055    PT Time Calculation (min) 40 min    Equipment Utilized During Treatment Gait belt    Activity Tolerance Patient tolerated treatment well    Behavior During Therapy [facility] for tasks assessed/performed             Past Medical History:  Diagnosis Date   Arthritis    right shoulder   Depression    Dyspnea    GERD (gastroesophageal reflux disease)    History of blood clots    Hyperlipidemia    Hypertension    Mild mitral regurgitation    Osteoporosis    Peripheral vascular disease ([place])    Stomach ulcer    Vascular disease    Sees Dr. Delana Meyer   Vertigo    Last episode approx Aug 2015   Wears dentures    full upper    Past Surgical History:  Procedure Laterality Date   ADENOIDECTOMY     AMPUTATION Right 08/11/2020   Procedure: AMPUTATION ABOVE KNEE;  Surgeon: [name], MD;  Location: [facility];  Service: Vascular;  Laterality: Right;   ARTERY BIOPSY Right 07/14/2019   Procedure: BIOPSY TEMPORAL ARTERY;  Surgeon: [name], MD;  Location: [facility];  Service: Vascular;  Laterality: Right;   BREAST CYST ASPIRATION Left    CARDIAC CATHETERIZATION  02/15/2017   [facility]   COLONOSCOPY WITH PROPOFOL N/A 10/22/2017   Procedure: COLONOSCOPY WITH PROPOFOL;  Surgeon: Lucilla Lame, MD;  Location: [place];  Service: Endoscopy;  Laterality: N/A;  specimens not taken--pt on Plavix will be brought back in after 7 days off med   COLONOSCOPY WITH  PROPOFOL N/A 11/05/2017   Procedure: COLONOSCOPY WITH PROPOFOL;  Surgeon: Lucilla Lame, MD;  Location: [place];  Service: Endoscopy;  Laterality: N/A;   ESOPHAGOGASTRODUODENOSCOPY N/A 12/21/2014   Procedure: ESOPHAGOGASTRODUODENOSCOPY (EGD);  Surgeon: Lucilla Lame, MD;  Location: [place];  Service: Gastroenterology;  Laterality: N/A;   ESOPHAGOGASTRODUODENOSCOPY (EGD) WITH PROPOFOL N/A 02/08/2016   Procedure: ESOPHAGOGASTRODUODENOSCOPY (EGD) WITH PROPOFOL;  Surgeon: Lucilla Lame, MD;  Location: [facility];  Service: Endoscopy;  Laterality: N/A;   FASCIOTOMY Right 05/30/2019   Procedure: FASCIOTOMY;  Surgeon: [name], MD;  Location: [facility];  Service: Vascular;  Laterality: Right;   FASCIOTOMY CLOSURE Right 06/04/2019   Procedure: FASCIOTOMY CLOSURE;  Surgeon: [name], MD;  Location: [facility];  Service: Vascular;  Laterality: Right;   HEMORROIDECTOMY  2014   LOWER EXTREMITY ANGIOGRAPHY Right 05/30/2019   Procedure: LOWER EXTREMITY ANGIOGRAPHY;  Surgeon: [name], MD;  Location: [place] [facility];  Service: Cardiovascular;  Laterality: Right;   LOWER EXTREMITY ANGIOGRAPHY Right 07/02/2020   Procedure: LOWER EXTREMITY ANGIOGRAPHY;  Surgeon: [name], MD;  Location: [place] [facility];  Service: Cardiovascular;  Laterality: Right;   PERIPHERAL VASCULAR CATHETERIZATION N/A 02/09/2016   Procedure: Abdominal Aortogram w/Lower Extremity;  Surgeon: Belenda Cruise  Eloise Levels, MD;  Location: [place] [facility];  Service: Cardiovascular;  Laterality: N/A;   PERIPHERAL VASCULAR CATHETERIZATION Right 02/10/2016   Procedure: Lower Extremity Angiography;  Surgeon: [name], MD;  Location: [place] [facility];  Service: Cardiovascular;  Laterality: Right;   POLYPECTOMY  11/05/2017   Procedure: POLYPECTOMY;  Surgeon: Lucilla Lame, MD;  Location: [place];  Service: Endoscopy;;   SHOULDER ARTHROSCOPY WITH ROTATOR CUFF REPAIR AND  SUBACROMIAL DECOMPRESSION Right 02/27/2020   Procedure: RIGHT SHOULDER ARTHROSCOPY SUBACROMIAL DECOMPRESSION, DISTAL CLAVICLE EXCISION AND MINI-OPEN ROTATOR CUFF REPAIR;  Surgeon: Thornton Park, MD;  Location: [facility];  Service: Orthopedics;  Laterality: Right;   TONSILLECTOMY     VASCULAR SURGERY  [id number]   Fem-Pop Bypass    There were no vitals filed for this visit.   Subjective Assessment - 05/27/21 1016     Subjective Patient denies any falls since last session. Notes that she has been trying to walk short distances at home without Lofstrand crutches. Patient presents to clinic with single lofstrand crutch today.    Pertinent History Pt has a history of RLE vascular issues since 1991. 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 She received her RLE prosthetic in April 2022 and has been receiving [facility] since her surgery. They discharged her yesterday to continue with OP PT. She continues with phantom RLE pain, especially at night and takes gabapentin with minimal relief. She has also been having R shoulder pain since the surgery which she attributes to using a wheelchair. She underwent R RTC surgery in September of 2021 and had physical therapy afterwards. She has been having issues with bilateral carpal tunnel and has a NCV study scheduled for tomorrow.    Currently in Pain? No/denies    Pain Onset More than a month ago            TREATMENT  Neuro Re-Ed:  All balance exercises performed in // bars without UE support unless otherwise noted: Forward/backward gait x 6 lengths each direction; Side stepping x 6 lengths each direction; Airex pad exercises:   Feet apart eyes closed: 3x30 sec, CGA. No LOB, last rep with significant R WB.   L tandem: 3x30 sec, CGA. No LOB. 6" step taps alternating, BUE support for improved erect posture, no LOB.   6" step ups: x10/LE. Multimodal cuing and use of mirror for upright posture utilizing R hip flexor. BUE  support for improved stability and erect posture.       PT Short Term Goals - 05/11/21 1000       PT SHORT TERM GOAL #1   Title Pt will be independent with HEP in order to improve strength and balance in order to decrease fall risk and improve function at home.    Time 6    Period Weeks    Status On-going    Target Date 06/01/21               PT Long Term Goals - 05/11/21 1000       PT LONG TERM GOAL #1   Title Pt will improve BERG to at least 52/56 in order to demonstrate clinically significant improvement in balance and decrease risk for falls;    Baseline 01/26/21: 45/56; 03/02/21: 47/56; 04/20/21: 49/56    Time 12    Period Weeks    Status Partially Met    Target Date 07/13/21      PT LONG TERM GOAL #2  Title Pt will improve ABC by at least 13% in order to demonstrate clinically significant improvement in balance confidence.    Baseline 01/26/21: 57.5%; 03/02/21: 89.4%    Time 8    Period Weeks    Status Achieved      PT LONG TERM GOAL #3   Title Pt will improve FOTO to at least 63 in order to demonstrate clinically significant improvement in function related to her RLE amputation    Baseline 01/26/21: 59; 03/02/21: 71; 04/06/21: 71    Time 8    Period Weeks    Status Achieved      PT LONG TERM GOAL #4   Title Pt will increase self-selected 10MWT to at least 0.6 m/s in order to demonstrate clinically significant improvement in community ambulation.    Baseline 01/26/21: 31.5s = 0.32 m/s; 03/02/21: 25.5s = 0.39 m/s; 04/06/21: 20.7s = 0.48 m/s    Time 8    Period Weeks    Status Partially Met    Target Date 07/13/21                   Plan - 05/27/21 1015     Clinical Impression Statement Patient demonstrates excellent motivation during session today. She ambulated in clinic with single lofstrand crutch and good stability. Patient able to maintain balance (CGA) with all neuromuscular re-ed interventions during session and demonstrated fluid movement with step  down from 6" with BUE support. Patient will continue to benefit from PT services to address deficits in strength, balance, and mobility in order to return to full function at home.    Personal Factors and Comorbidities Age;Comorbidity 3+    Comorbidities Depression, osteoporosis, vascular disease    Examination-Activity Limitations Bathing;Locomotion Level;Squat;Stairs;Stand;Transfers    Examination-Participation Restrictions Cleaning;Community Activity;Meal Prep;Yard Work    Stability/Clinical Decision Making Unstable/Unpredictable    Rehab Potential Good    PT Frequency 2x / week    PT Duration 12 weeks    PT Treatment/Interventions ADLs/Self Care Home Management;Aquatic Therapy;Canalith Repostioning;Cryotherapy;Electrical Stimulation;Iontophoresis 32m/ml Dexamethasone;Moist Heat;Traction;Ultrasound;Gait training;Stair training;Therapeutic activities;Therapeutic exercise;Neuromuscular re-education;Manual techniques;Dry needling;Vestibular;Spinal Manipulations;Joint Manipulations    PT Next Visit Plan strengthening, practice gait training with lofstrand crutches    PT Home Exercise Plan Access Code: [email or web address]    Consulted and Agree with Plan of Care Patient             Patient will benefit from skilled therapeutic intervention in order to improve the following deficits and impairments:  Abnormal gait, Decreased strength, Difficulty walking  Visit Diagnosis: Muscle weakness (generalized)  Difficulty in walking, not elsewhere classified  Unsteadiness on feet  Phantom limb syndrome with pain ([facility]     Problem List Patient Active Problem List   Diagnosis Date Noted   Muscle spasm 09/09/2020   S/P AKA (above knee amputation), right ([facility] 09/08/2020   Ischemia of extremity 07/02/2020   Chronic pain in right shoulder 11/25/2019   Encounter for long-term (current) use of high-risk medication 07/16/2019   GCA (giant cell arteritis) ([place] 07/16/2019   Temporal arteritis ([facility]  07/13/2019   Postoperative wound infection 06/23/2019   Ischemic leg 05/31/2019   Atherosclerosis of native arteries of extremity with intermittent claudication ([facility] 05/30/2019   Depression 05/29/2019   Eczema of lower extremity 03/04/2018   Chronic venous insufficiency 03/02/2018   Personal history of colonic polyps    Family history of colonic polyps    Benign neoplasm of ascending colon    Mitral valve insufficiency 02/08/2017   Dyspnea on exertion 02/07/2017  Non-rheumatic mitral regurgitation 02/07/2017   Precordial pain 02/07/2017   CAD (coronary artery disease) 12/21/2016   Essential hypertension 12/21/2016   Compression fracture of lumbar spine, non-traumatic ([place]) 04/26/2016   Compression fracture of L3 lumbar vertebra 04/20/2016   PVD (peripheral vascular disease) ([place]) 02/24/2016   Family history of premature CAD 02/24/2016   Gastritis    Other specified diseases of esophagus    Hiatal hernia    Gastritis and gastroduodenitis    Ischemia of lower extremity    Arterial occlusion (H[facility])    Atherosclerosis of aorta (H[facility])    History of smoking    Hyperlipidemia    Pain in the chest    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 02/05/2016   Carotid artery stenosis 01/04/2016   Vertigo, benign paroxysmal 12/28/2015   Clinical depression 09/06/2015   History of alcoholism ([place]) 09/06/2015   Angiopathy, 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[place]) 09/06/2015   Atherosclerosis of native arteries of extremity with rest pain ([place]) 09/06/2015   Acute non-recurrent maxillary sinusitis 07/14/2015   Vaginal pruritus 05/26/2015   Chronic recurrent major depressive disorder ([place]) 03/09/2015   Osteoporosis, post-menopausal 03/09/2015   Peripheral blood vessel disorder ([place]) 03/09/2015   Acid reflux 03/09/2015   GERD (gastroesophageal reflux disease) 12/21/2014   Carotid artery narrowing 01/28/2014   Peripheral arterial occlusive disease ([place]) 06/08/2011   Occlusion and stenosis of  unspecified carotid artery 06/08/2011   PAD (peripheral artery disease) ([place]) 06/08/2011    Myles Gip PT, DPT [phone]  05/27/2021, 11:12 AM  [place] [facility] [address] [place], [place], [address] Phone: [phone]   Fax:  [phone]  Name: Sara [name] MRN: [id number] Date of Birth: 07-19-1951

## 2021-06-01 ENCOUNTER — Other Ambulatory Visit: Payer: Self-pay

## 2021-06-01 ENCOUNTER — Ambulatory Visit: Payer: Medicare Other

## 2021-06-01 DIAGNOSIS — M6281 Muscle weakness (generalized): Secondary | ICD-10-CM

## 2021-06-01 DIAGNOSIS — R262 Difficulty in walking, not elsewhere classified: Secondary | ICD-10-CM

## 2021-06-01 NOTE — Therapy (Signed)
[facility] [address] [place], [place], [address] Phone: [phone]   Fax:  [phone]  Physical Therapy Treatment  Patient Details  Name: FATIME BISWELL MRN: [id number] Date of Birth: 02-02-1951 Referring Provider (PT): Dr. Delana Meyer   Encounter Date: 06/01/2021   PT End of Session - 06/01/21 0950     Visit Number 36    Number of Visits 18    Date for PT Re-Evaluation 07/13/21    Authorization Type eval: 01/26/21    PT Start Time 0941    PT Stop Time 1015    PT Time Calculation (min) 34 min    Equipment Utilized During Treatment Gait belt    Activity Tolerance Patient tolerated treatment well    Behavior During Therapy [facility] for tasks assessed/performed                Past Medical History:  Diagnosis Date   Arthritis    right shoulder   Depression    Dyspnea    GERD (gastroesophageal reflux disease)    History of blood clots    Hyperlipidemia    Hypertension    Mild mitral regurgitation    Osteoporosis    Peripheral vascular disease ([place])    Stomach ulcer    Vascular disease    Sees Dr. Delana Meyer   Vertigo    Last episode approx Aug 2015   Wears dentures    full upper    Past Surgical History:  Procedure Laterality Date   ADENOIDECTOMY     AMPUTATION Right 08/11/2020   Procedure: AMPUTATION ABOVE KNEE;  Surgeon: [name], MD;  Location: [facility];  Service: Vascular;  Laterality: Right;   ARTERY BIOPSY Right 07/14/2019   Procedure: BIOPSY TEMPORAL ARTERY;  Surgeon: [name], MD;  Location: [facility];  Service: Vascular;  Laterality: Right;   BREAST CYST ASPIRATION Left    CARDIAC CATHETERIZATION  02/15/2017   [facility]   COLONOSCOPY WITH PROPOFOL N/A 10/22/2017   Procedure: COLONOSCOPY WITH PROPOFOL;  Surgeon: Lucilla Lame, MD;  Location: [place];  Service: Endoscopy;  Laterality: N/A;  specimens not taken--pt on Plavix will be brought back in after 7 days off med   COLONOSCOPY  WITH PROPOFOL N/A 11/05/2017   Procedure: COLONOSCOPY WITH PROPOFOL;  Surgeon: Lucilla Lame, MD;  Location: [place];  Service: Endoscopy;  Laterality: N/A;   ESOPHAGOGASTRODUODENOSCOPY N/A 12/21/2014   Procedure: ESOPHAGOGASTRODUODENOSCOPY (EGD);  Surgeon: Lucilla Lame, MD;  Location: [place];  Service: Gastroenterology;  Laterality: N/A;   ESOPHAGOGASTRODUODENOSCOPY (EGD) WITH PROPOFOL N/A 02/08/2016   Procedure: ESOPHAGOGASTRODUODENOSCOPY (EGD) WITH PROPOFOL;  Surgeon: Lucilla Lame, MD;  Location: [facility];  Service: Endoscopy;  Laterality: N/A;   FASCIOTOMY Right 05/30/2019   Procedure: FASCIOTOMY;  Surgeon: [name], MD;  Location: [facility];  Service: Vascular;  Laterality: Right;   FASCIOTOMY CLOSURE Right 06/04/2019   Procedure: FASCIOTOMY CLOSURE;  Surgeon: [name], MD;  Location: [facility];  Service: Vascular;  Laterality: Right;   HEMORROIDECTOMY  2014   LOWER EXTREMITY ANGIOGRAPHY Right 05/30/2019   Procedure: LOWER EXTREMITY ANGIOGRAPHY;  Surgeon: [name], MD;  Location: [place] [facility];  Service: Cardiovascular;  Laterality: Right;   LOWER EXTREMITY ANGIOGRAPHY Right 07/02/2020   Procedure: LOWER EXTREMITY ANGIOGRAPHY;  Surgeon: [name], MD;  Location: [facility] [facility];  Service: Cardiovascular;  Laterality: Right;   PERIPHERAL VASCULAR CATHETERIZATION N/A 02/09/2016   Procedure: Abdominal Aortogram w/Lower Extremity;  Surgeon: [name], MD;  Location: [place] [facility];  Service: Cardiovascular;  Laterality: N/A;   PERIPHERAL VASCULAR CATHETERIZATION Right 02/10/2016   Procedure: Lower Extremity Angiography;  Surgeon: [name], MD;  Location: [place] [facility];  Service: Cardiovascular;  Laterality: Right;   POLYPECTOMY  11/05/2017   Procedure: POLYPECTOMY;  Surgeon: Lucilla Lame, MD;  Location: [place];  Service: Endoscopy;;   SHOULDER ARTHROSCOPY WITH ROTATOR CUFF REPAIR AND  SUBACROMIAL DECOMPRESSION Right 02/27/2020   Procedure: RIGHT SHOULDER ARTHROSCOPY SUBACROMIAL DECOMPRESSION, DISTAL CLAVICLE EXCISION AND MINI-OPEN ROTATOR CUFF REPAIR;  Surgeon: Thornton Park, MD;  Location: [facility];  Service: Orthopedics;  Laterality: Right;   TONSILLECTOMY     VASCULAR SURGERY  6712,4580   Fem-Pop Bypass    There were no vitals filed for this visit.    Subjective Assessment - 06/01/21 0946     Subjective Patient denies any falls since last session. Patient presents to clinic with single lofstrand crutch today. She has been ambulating recently with single crutch and reports feeling stable. No specific questions or concerns currently.    Pertinent History Pt has a history of RLE vascular issues since 1991. 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 She received her RLE prosthetic in April 2022 and has been receiving [facility] since her surgery. They discharged her yesterday to continue with OP PT. She continues with phantom RLE pain, especially at night and takes gabapentin with minimal relief. She has also been having R shoulder pain since the surgery which she attributes to using a wheelchair. She underwent R RTC surgery in September of 2021 and had physical therapy afterwards. She has been having issues with bilateral carpal tunnel and has a NCV study scheduled for tomorrow.               TREATMENT   Neuromuscular Re-education  NuStep L2 x 7 minutes BLE only for warm-up during interval history; Forward/backward gait in // bars without UE support x 2 lengths each direction; Side stepping in // bars without UE support x 2 lengths each direction; Practiced stair ascend/descend (4 steps) without UE support but with CGA/minA+1 support from therapist; Gait in rehab gym without any assistive device, practiced increasing L step length to improve toe off with RLE prosthesis; Obstacle course in rehab gym without UE support stepping over  1/2 foam roll and 6" hurdles, multiple bouts performed with intermittent seated rest breaks;   Pt educated throughout session about proper posture and technique with exercises. Improved exercise technique, movement at target joints, use of target muscles after min to mod verbal, visual, tactile cues.      Patient demonstrates excellent motivation during session today. Continued to challenge balance without UE support including stair training and obstacle course in rehab gym. Patient encouraged to continue HEP and follow-up as scheduled.  She will benefit from PT services to address deficits in strength, balance, and mobility in order to return to full function at home.                            PT Short Term Goals - 05/11/21 1000       PT SHORT TERM GOAL #1   Title Pt will be independent with HEP in order to improve strength and balance in order to decrease fall risk and improve function at home.    Time 6    Period Weeks  Status On-going    Target Date 06/01/21               PT Long Term Goals - 05/11/21 1000       PT LONG TERM GOAL #1   Title Pt will improve BERG to at least 52/56 in order to demonstrate clinically significant improvement in balance and decrease risk for falls;    Baseline 01/26/21: 45/56; 03/02/21: 47/56; 04/20/21: 49/56    Time 12    Period Weeks    Status Partially Met    Target Date 07/13/21      PT LONG TERM GOAL #2   Title Pt will improve ABC by at least 13% in order to demonstrate clinically significant improvement in balance confidence.    Baseline 01/26/21: 57.5%; 03/02/21: 89.4%    Time 8    Period Weeks    Status Achieved      PT LONG TERM GOAL #3   Title Pt will improve FOTO to at least 63 in order to demonstrate clinically significant improvement in function related to her RLE amputation    Baseline 01/26/21: 59; 03/02/21: 71; 04/06/21: 71    Time 8    Period Weeks    Status Achieved      PT LONG TERM GOAL #4   Title Pt  will increase self-selected 10MWT to at least 0.6 m/s in order to demonstrate clinically significant improvement in community ambulation.    Baseline 01/26/21: 31.5s = 0.32 m/s; 03/02/21: 25.5s = 0.39 m/s; 04/06/21: 20.7s = 0.48 m/s    Time 8    Period Weeks    Status Partially Met    Target Date 07/13/21                   Plan - 06/01/21 0950     Clinical Impression Statement Patient demonstrates excellent motivation during session today. Continued to challenge balance without UE support including stair training and obstacle course in rehab gym. Patient encouraged to continue HEP and follow-up as scheduled.  She will benefit from PT services to address deficits in strength, balance, and mobility in order to return to full function at home.    Personal Factors and Comorbidities Age;Comorbidity 3+    Comorbidities Depression, osteoporosis, vascular disease    Examination-Activity Limitations Bathing;Locomotion Level;Squat;Stairs;Stand;Transfers    Examination-Participation Restrictions Cleaning;Community Activity;Meal Prep;Yard Work    Stability/Clinical Decision Making Unstable/Unpredictable    Rehab Potential Good    PT Frequency 2x / week    PT Duration 12 weeks    PT Treatment/Interventions ADLs/Self Care Home Management;Aquatic Therapy;Canalith Repostioning;Cryotherapy;Electrical Stimulation;Iontophoresis 42m/ml Dexamethasone;Moist Heat;Traction;Ultrasound;Gait training;Stair training;Therapeutic activities;Therapeutic exercise;Neuromuscular re-education;Manual techniques;Dry needling;Vestibular;Spinal Manipulations;Joint Manipulations    PT Next Visit Plan strengthening, practice gait training with lofstrand crutches    PT Home Exercise Plan Access Code: [email or web address]    Consulted and Agree with Plan of Care Patient                Patient will benefit from skilled therapeutic intervention in order to improve the following deficits and impairments:  Abnormal gait, Decreased  strength, Difficulty walking  Visit Diagnosis: Muscle weakness (generalized)  Difficulty in walking, not elsewhere classified     Problem List Patient Active Problem List   Diagnosis Date Noted   Muscle spasm 09/09/2020   S/P AKA (above knee [place]putation), [place] 09/08/2020   Ischemia of extremity 07/02/2020   Chronic pain in right shoulder 11/25/2019   Encounter for long-term (current) use of high-risk medication 07/16/2019  GCA (giant cell arteritis) ([place]) 07/16/2019   Temporal arteritis ([place]) 07/13/2019   Postoperative wound infection 06/23/2019   Ischemic leg 05/31/2019   Atherosclerosis of native arteries of extremity with intermittent claudication ([place]) 05/30/2019   Depression 05/29/2019   Eczema of lower extremity 03/04/2018   Chronic venous insufficiency 03/02/2018   Personal history of colonic polyps    Family history of colonic polyps    Benign neoplasm of ascending colon    Mitral valve insufficiency 02/08/2017   Dyspnea on exertion 02/07/2017   Non-rheumatic mitral regurgitation 02/07/2017   Precordial pain 02/07/2017   CAD (coronary artery disease) 12/21/2016   Essential hypertension 12/21/2016   Compression fracture of lumbar spine, non-traumatic ([place]) 04/26/2016   Compression fracture of L3 lumbar vertebra 04/20/2016   PVD (peripheral vascular disease) ([place]) 02/24/2016   Family history of premature CAD 02/24/2016   Gastritis    Other specified diseases of esophagus    Hiatal hernia    Gastritis and gastroduodenitis    Ischemia of lower extremity    Arterial occlusion (HCC)    Atherosclerosis of aorta (HCC)    History of smoking    Hyperlipidemia    Pain in the chest    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 02/05/2016   Carotid artery stenosis 01/04/2016   Vertigo, benign paroxysmal 12/28/2015   Clinical depression 09/06/2015   History of alcoholism ([place]) 09/06/2015   Angiopathy, 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[place]) 09/06/2015    Atherosclerosis of native arteries of extremity with rest pain ([place]) 09/06/2015   Acute non-recurrent maxillary sinusitis 07/14/2015   Vaginal pruritus 05/26/2015   Chronic recurrent major depressive disorder ([place]) 03/09/2015   Osteoporosis, post-menopausal 03/09/2015   Peripheral blood vessel disorder ([place]) 03/09/2015   Acid reflux 03/09/2015   GERD (gastroesophageal reflux disease) 12/21/2014   Carotid artery narrowing 01/28/2014   Peripheral arterial occlusive disease ([place]) 06/08/2011   Occlusion and stenosis of unspecified carotid artery 06/08/2011   PAD (peripheral artery disease) ([place]) 06/08/2011   Lyndel Safe Chitara Clonch PT, DPT, GCS  Geralyn Figiel 06/01/2021, 2:02 PM  [facility] [address] [place], [place], [address] Phone: [phone]   Fax:  [phone]  Name: MAJORIE SANTEE MRN: [id number] Date of Birth: [birth year]

## 2021-06-03 ENCOUNTER — Ambulatory Visit: Payer: Medicare Other

## 2021-06-03 DIAGNOSIS — M6281 Muscle weakness (generalized): Secondary | ICD-10-CM

## 2021-06-03 DIAGNOSIS — R262 Difficulty in walking, not elsewhere classified: Secondary | ICD-10-CM

## 2021-06-03 NOTE — Therapy (Signed)
[facility] [address]. [place], [place], [address] Phone: [phone]   Fax:  [phone]  Physical Therapy Treatment  Patient Details  Name: Sara [name] MRN: [id number] Date of Birth: 1951-02-06 Referring Provider (PT): Dr. Delana Meyer   Encounter Date: 06/03/2021   PT End of Session - 06/03/21 1141     Visit Number 37    Number of Visits 26    Date for PT Re-Evaluation 07/13/21    Authorization Type eval: 01/26/21    PT Start Time 1015    PT Stop Time 1100    PT Time Calculation (min) 45 min    Equipment Utilized During Treatment Gait belt    Activity Tolerance Patient tolerated treatment well    Behavior During Therapy [facility] for tasks assessed/performed                Past Medical History:  Diagnosis Date   Arthritis    right shoulder   Depression    Dyspnea    GERD (gastroesophageal reflux disease)    History of blood clots    Hyperlipidemia    Hypertension    Mild mitral regurgitation    Osteoporosis    Peripheral vascular disease ([place])    Stomach ulcer    Vascular disease    Sees Dr. Delana Meyer   Vertigo    Last episode approx Aug 2015   Wears dentures    full upper    Past Surgical History:  Procedure Laterality Date   ADENOIDECTOMY     AMPUTATION Right 08/11/2020   Procedure: AMPUTATION ABOVE KNEE;  Surgeon: [name], MD;  Location: [facility];  Service: Vascular;  Laterality: Right;   ARTERY BIOPSY Right 07/14/2019   Procedure: BIOPSY TEMPORAL ARTERY;  Surgeon: [name], MD;  Location: [facility];  Service: Vascular;  Laterality: Right;   BREAST CYST ASPIRATION Left    CARDIAC CATHETERIZATION  02/15/2017   [facility]   COLONOSCOPY WITH PROPOFOL N/A 10/22/2017   Procedure: COLONOSCOPY WITH PROPOFOL;  Surgeon: Lucilla Lame, MD;  Location: [place];  Service: Endoscopy;  Laterality: N/A;  specimens not taken--pt on Plavix will be brought back in after 7 days off med   COLONOSCOPY  WITH PROPOFOL N/A 11/05/2017   Procedure: COLONOSCOPY WITH PROPOFOL;  Surgeon: Lucilla Lame, MD;  Location: [place];  Service: Endoscopy;  Laterality: N/A;   ESOPHAGOGASTRODUODENOSCOPY N/A 12/21/2014   Procedure: ESOPHAGOGASTRODUODENOSCOPY (EGD);  Surgeon: Lucilla Lame, MD;  Location: [place];  Service: Gastroenterology;  Laterality: N/A;   ESOPHAGOGASTRODUODENOSCOPY (EGD) WITH PROPOFOL N/A 02/08/2016   Procedure: ESOPHAGOGASTRODUODENOSCOPY (EGD) WITH PROPOFOL;  Surgeon: Lucilla Lame, MD;  Location: [facility];  Service: Endoscopy;  Laterality: N/A;   FASCIOTOMY Right 05/30/2019   Procedure: FASCIOTOMY;  Surgeon: [name], MD;  Location: [facility];  Service: Vascular;  Laterality: Right;   FASCIOTOMY CLOSURE Right 06/04/2019   Procedure: FASCIOTOMY CLOSURE;  Surgeon: [name], MD;  Location: [facility];  Service: Vascular;  Laterality: Right;   HEMORROIDECTOMY  2014   LOWER EXTREMITY ANGIOGRAPHY Right 05/30/2019   Procedure: LOWER EXTREMITY ANGIOGRAPHY;  Surgeon: [name], MD;  Location: [place] [facility];  Service: Cardiovascular;  Laterality: Right;   LOWER EXTREMITY ANGIOGRAPHY Right 07/02/2020   Procedure: LOWER EXTREMITY ANGIOGRAPHY;  Surgeon: [name], MD;  Location: [place] [facility];  Service: Cardiovascular;  Laterality: Right;   PERIPHERAL VASCULAR CATHETERIZATION N/A 02/09/2016   Procedure: Abdominal Aortogram w/Lower Extremity;  Surgeon: [name], MD;  Location: [place] [facility];  Service: Cardiovascular;  Laterality: N/A;   PERIPHERAL VASCULAR CATHETERIZATION Right 02/10/2016   Procedure: Lower Extremity Angiography;  Surgeon: [name], MD;  Location: [place] [facility];  Service: Cardiovascular;  Laterality: Right;   POLYPECTOMY  11/05/2017   Procedure: POLYPECTOMY;  Surgeon: Lucilla Lame, MD;  Location: [place];  Service: Endoscopy;;   SHOULDER ARTHROSCOPY WITH ROTATOR CUFF REPAIR AND  SUBACROMIAL DECOMPRESSION Right 02/27/2020   Procedure: RIGHT SHOULDER ARTHROSCOPY SUBACROMIAL DECOMPRESSION, DISTAL CLAVICLE EXCISION AND MINI-OPEN ROTATOR CUFF REPAIR;  Surgeon: Thornton Park, MD;  Location: [facility];  Service: Orthopedics;  Laterality: Right;   TONSILLECTOMY     VASCULAR SURGERY  [id number]   Fem-Pop Bypass    There were no vitals filed for this visit.    Subjective Assessment - 06/03/21 1140     Subjective Patient denies any falls since last session. She presents to clinic with single lofstrand crutch again today. She denies any resting pain upon arrival but continues to report some phantom limb pain. No specific questions or concerns currently.    Pertinent History Pt has a history of RLE vascular issues since 1991. 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 She received her RLE prosthetic in April 2022 and has been receiving [facility] since her surgery. They discharged her yesterday to continue with OP PT. She continues with phantom RLE pain, especially at night and takes gabapentin with minimal relief. She has also been having R shoulder pain since the surgery which she attributes to using a wheelchair. She underwent R RTC surgery in September of 2021 and had physical therapy afterwards. She has been having issues with bilateral carpal tunnel and has a NCV study scheduled for tomorrow.               TREATMENT   Neuromuscular Re-education  NuStep L2 x 7 minutes BLE only for warm-up during interval history; Forward/backward gait in // bars without UE support x 4 lengths each direction; Side stepping in // bars without UE support x 4 lengths each direction; Gait in rehab gym and hallway without any assistive device, practiced increasing L step length to improve toe off with RLE prosthesis; Obstacle course in hallway without UE support stepping over 6" hurdles and ascending/descending 6" step, multiple bouts performed with  intermittent seated rest breaks. During all bouts pt challenged with dual tasking (naming fruits, vegetables, presidents, serial 7 subtraction);   Mirror therapy performed with patient sitting at edge of mat table and mirror between legs to simulate right lower extremity.  While watching the mirror with LLE patient performed seated marches x 1 minute with 3# ankle weight, long arc quads with 3# ankle weight x 1 minute, L ankle AROM plantar flexion/dorsiflexion/circles x 1 minute, L ankle ball circles CW/CCW x 1 minute;   Pt educated throughout session about proper posture and technique with exercises. Improved exercise technique, movement at target joints, use of target muscles after min to mod verbal, visual, tactile cues.      Patient demonstrates excellent motivation during session today. Continued to challenge balance without UE support including an obstacle course in the hallway while incorporating dual task challenges. Also repeated mirror therapy to help with phantom limb pain. Overall pt is demonstrating significant improvement in her R single leg stability and strength. Patient encouraged to continue HEP and follow-up as scheduled.  She will benefit from PT services to address  deficits in strength, balance, and mobility in order to return to full function at home.                            PT Short Term Goals - 05/11/21 1000       PT SHORT TERM GOAL #1   Title Pt will be independent with HEP in order to improve strength and balance in order to decrease fall risk and improve function at home.    Time 6    Period Weeks    Status On-going    Target Date 06/01/21               PT Long Term Goals - 05/11/21 1000       PT LONG TERM GOAL #1   Title Pt will improve BERG to at least 52/56 in order to demonstrate clinically significant improvement in balance and decrease risk for falls;    Baseline 01/26/21: 45/56; 03/02/21: 47/56; 04/20/21: 49/56    Time 12     Period Weeks    Status Partially Met    Target Date 07/13/21      PT LONG TERM GOAL #2   Title Pt will improve ABC by at least 13% in order to demonstrate clinically significant improvement in balance confidence.    Baseline 01/26/21: 57.5%; 03/02/21: 89.4%    Time 8    Period Weeks    Status Achieved      PT LONG TERM GOAL #3   Title Pt will improve FOTO to at least 63 in order to demonstrate clinically significant improvement in function related to her RLE amputation    Baseline 01/26/21: 59; 03/02/21: 71; 04/06/21: 71    Time 8    Period Weeks    Status Achieved      PT LONG TERM GOAL #4   Title Pt will increase self-selected 10MWT to at least 0.6 m/s in order to demonstrate clinically significant improvement in community ambulation.    Baseline 01/26/21: 31.5s = 0.32 m/s; 03/02/21: 25.5s = 0.39 m/s; 04/06/21: 20.7s = 0.48 m/s    Time 8    Period Weeks    Status Partially Met    Target Date 07/13/21                   Plan - 06/03/21 1142     Clinical Impression Statement Patient demonstrates excellent motivation during session today. Continued to challenge balance without UE support including an obstacle course in the hallway while incorporating dual task challenges. Also repeated mirror therapy to help with phantom limb pain. Overall pt is demonstrating significant improvement in her R single leg stability and strength. Patient encouraged to continue HEP and follow-up as scheduled.  She will benefit from PT services to address deficits in strength, balance, and mobility in order to return to full function at home.    Personal Factors and Comorbidities Age;Comorbidity 3+    Comorbidities Depression, osteoporosis, vascular disease    Examination-Activity Limitations Bathing;Locomotion Level;Squat;Stairs;Stand;Transfers    Examination-Participation Restrictions Cleaning;Community Activity;Meal Prep;Yard Work    Stability/Clinical Decision Making Unstable/Unpredictable    Rehab  Potential Good    PT Frequency 2x / week    PT Duration 12 weeks    PT Treatment/Interventions ADLs/Self Care Home Management;Aquatic Therapy;Canalith Repostioning;Cryotherapy;Electrical Stimulation;Iontophoresis 44m/ml Dexamethasone;Moist Heat;Traction;Ultrasound;Gait training;Stair training;Therapeutic activities;Therapeutic exercise;Neuromuscular re-education;Manual techniques;Dry needling;Vestibular;Spinal Manipulations;Joint Manipulations    PT Next Visit Plan strengthening, practice gait training with lofstrand crutches  PT Home Exercise Plan Access Code: [email or web address]    Consulted and Agree with Plan of Care Patient                Patient will benefit from skilled therapeutic intervention in order to improve the following deficits and impairments:  Abnormal gait, Decreased strength, Difficulty walking  Visit Diagnosis: Muscle weakness (generalized)  Difficulty in walking, not elsewhere classified     Problem List Patient Active Problem List   Diagnosis Date Noted   Muscle spasm 09/09/2020   S/P AKA (above knee amputation), right ([place]) 09/08/2020   Ischemia of extremity 07/02/2020   Chronic pain in right shoulder 11/25/2019   Encounter for long-term (current) use of high-risk medication 07/16/2019   GCA (giant cell arteritis) ([place]) 07/16/2019   Temporal arteritis ([place]) 07/13/2019   Postoperative wound infection 06/23/2019   Ischemic leg 05/31/2019   Atherosclerosis of native arteries of extremity with intermittent claudication ([place]) 05/30/2019   Depression 05/29/2019   Eczema of lower extremity 03/04/2018   Chronic venous insufficiency 03/02/2018   Personal history of colonic polyps    Family history of colonic polyps    Benign neoplasm of ascending colon    Mitral valve insufficiency 02/08/2017   Dyspnea on exertion 02/07/2017   Non-rheumatic mitral regurgitation 02/07/2017   Precordial pain 02/07/2017   CAD (coronary artery disease) 12/21/2016   Essential  hypertension 12/21/2016   Compression fracture of lumbar spine, non-traumatic ([place]) 04/26/2016   Compression fracture of L3 lumbar vertebra 04/20/2016   PVD (peripheral vascular disease) ([place]) 02/24/2016   Family history of premature CAD 02/24/2016   Gastritis    Other specified diseases of esophagus    Hiatal hernia    Gastritis and gastroduodenitis    Ischemia of lower extremity    Arterial occlusion ([facility])    Atherosclerosis of aorta ([facility])    History of smoking    Hyperlipidemia    Pain in the chest    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 02/05/2016   Carotid artery stenosis 01/04/2016   Vertigo, benign paroxysmal 12/28/2015   Clinical depression 09/06/2015   History of alcoholism ([place]) 09/06/2015   Angiopathy, 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) 09/06/2015   Atherosclerosis of native arteries of extremity with rest pain ([place]) 09/06/2015   Acute non-recurrent maxillary sinusitis 07/14/2015   Vaginal pruritus 05/26/2015   Chronic recurrent major depressive disorder ([place]) 03/09/2015   Osteoporosis, post-menopausal 03/09/2015   Peripheral blood vessel disorder ([place]) 03/09/2015   Acid reflux 03/09/2015   GERD (gastroesophageal reflux disease) 12/21/2014   Carotid artery narrowing 01/28/2014   Peripheral arterial occlusive disease ([place]) 06/08/2011   Occlusion and stenosis of unspecified carotid artery 06/08/2011   PAD (peripheral artery disease) ([place]) 06/08/2011   Phillips Grout PT, DPT, GCS  Yeimy Brabant 06/03/2021, 1:22 PM  [facility] [address] [place], [place], [address] Phone: [phone]   Fax:  [phone]  Name: LANIYAH ROSENWALD MRN: [id number] Date of Birth: 06/20/1951

## 2021-06-07 ENCOUNTER — Ambulatory Visit: Payer: Medicare Other

## 2021-06-07 ENCOUNTER — Other Ambulatory Visit: Payer: Self-pay

## 2021-06-07 DIAGNOSIS — M6281 Muscle weakness (generalized): Secondary | ICD-10-CM

## 2021-06-07 DIAGNOSIS — R262 Difficulty in walking, not elsewhere classified: Secondary | ICD-10-CM

## 2021-06-07 NOTE — Therapy (Signed)
[facility] [address] [place], [place], [address] Phone: [phone]   Fax:  [phone]  Physical Therapy Treatment  Patient Details  Name: Sara [name] MRN: [id number] Date of Birth: 11-28-1950 Referring Provider (PT): Dr. Delana Meyer   Encounter Date: 06/07/2021   PT End of Session - 06/07/21 1211     Visit Number 38    Number of Visits 25    Date for PT Re-Evaluation 07/13/21    Authorization Type eval: 01/26/21    PT Start Time 1015    PT Stop Time 1100    PT Time Calculation (min) 45 min    Equipment Utilized During Treatment Gait belt    Activity Tolerance Patient tolerated treatment well    Behavior During Therapy [facility] for tasks assessed/performed                 Past Medical History:  Diagnosis Date   Arthritis    right shoulder   Depression    Dyspnea    GERD (gastroesophageal reflux disease)    History of blood clots    Hyperlipidemia    Hypertension    Mild mitral regurgitation    Osteoporosis    Peripheral vascular disease ([place])    Stomach ulcer    Vascular disease    Sees Dr. Delana Meyer   Vertigo    Last episode approx Aug 2015   Wears dentures    full upper    Past Surgical History:  Procedure Laterality Date   ADENOIDECTOMY     AMPUTATION Right 08/11/2020   Procedure: AMPUTATION ABOVE KNEE;  Surgeon: [name], MD;  Location: [facility];  Service: Vascular;  Laterality: Right;   ARTERY BIOPSY Right 07/14/2019   Procedure: BIOPSY TEMPORAL ARTERY;  Surgeon: [name], MD;  Location: [facility];  Service: Vascular;  Laterality: Right;   BREAST CYST ASPIRATION Left    CARDIAC CATHETERIZATION  02/15/2017   [facility]   COLONOSCOPY WITH PROPOFOL N/A 10/22/2017   Procedure: COLONOSCOPY WITH PROPOFOL;  Surgeon: Lucilla Lame, MD;  Location: [place];  Service: Endoscopy;  Laterality: N/A;  specimens not taken--pt on Plavix will be brought back in after 7 days off med   COLONOSCOPY  WITH PROPOFOL N/A 11/05/2017   Procedure: COLONOSCOPY WITH PROPOFOL;  Surgeon: Lucilla Lame, MD;  Location: [place];  Service: Endoscopy;  Laterality: N/A;   ESOPHAGOGASTRODUODENOSCOPY N/A 12/21/2014   Procedure: ESOPHAGOGASTRODUODENOSCOPY (EGD);  Surgeon: Lucilla Lame, MD;  Location: [place];  Service: Gastroenterology;  Laterality: N/A;   ESOPHAGOGASTRODUODENOSCOPY (EGD) WITH PROPOFOL N/A 02/08/2016   Procedure: ESOPHAGOGASTRODUODENOSCOPY (EGD) WITH PROPOFOL;  Surgeon: Lucilla Lame, MD;  Location: [facility];  Service: Endoscopy;  Laterality: N/A;   FASCIOTOMY Right 05/30/2019   Procedure: FASCIOTOMY;  Surgeon: [name], MD;  Location: [facility];  Service: Vascular;  Laterality: Right;   FASCIOTOMY CLOSURE Right 06/04/2019   Procedure: FASCIOTOMY CLOSURE;  Surgeon: [name], MD;  Location: [facility];  Service: Vascular;  Laterality: Right;   HEMORROIDECTOMY  2014   LOWER EXTREMITY ANGIOGRAPHY Right 05/30/2019   Procedure: LOWER EXTREMITY ANGIOGRAPHY;  Surgeon: [name], MD;  Location: [place] [facility];  Service: Cardiovascular;  Laterality: Right;   LOWER EXTREMITY ANGIOGRAPHY Right 07/02/2020   Procedure: LOWER EXTREMITY ANGIOGRAPHY;  Surgeon: [name], MD;  Location: [place] [facility];  Service: Cardiovascular;  Laterality: Right;   PERIPHERAL VASCULAR CATHETERIZATION N/A 02/09/2016   Procedure: Abdominal Aortogram w/Lower  Extremity;  Surgeon: [name], MD;  Location: [place] [facility];  Service: Cardiovascular;  Laterality: N/A;   PERIPHERAL VASCULAR CATHETERIZATION Right 02/10/2016   Procedure: Lower Extremity Angiography;  Surgeon: [name], MD;  Location: [place] [facility];  Service: Cardiovascular;  Laterality: Right;   POLYPECTOMY  11/05/2017   Procedure: POLYPECTOMY;  Surgeon: Lucilla Lame, MD;  Location: [place];  Service: Endoscopy;;   SHOULDER ARTHROSCOPY WITH ROTATOR CUFF REPAIR AND  SUBACROMIAL DECOMPRESSION Right 02/27/2020   Procedure: RIGHT SHOULDER ARTHROSCOPY SUBACROMIAL DECOMPRESSION, DISTAL CLAVICLE EXCISION AND MINI-OPEN ROTATOR CUFF REPAIR;  Surgeon: Thornton Park, MD;  Location: [facility];  Service: Orthopedics;  Laterality: Right;   TONSILLECTOMY     VASCULAR SURGERY  [phone]   Fem-Pop Bypass    There were no vitals filed for this visit.    Subjective Assessment - 06/07/21 1211     Subjective Patient denies any falls since last session. She presents to clinic with single lofstrand crutch again today. She denies any resting pain upon arrival and states that her phantom limb pain has been improving. No specific questions or concerns currently.    Pertinent History Pt has a history of RLE vascular issues since 1991. 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 She received her RLE prosthetic in April 2022 and has been receiving [place] PT since her surgery. They discharged her yesterday to continue with OP PT. She continues with phantom RLE pain, especially at night and takes gabapentin with minimal relief. She has also been having R shoulder pain since the surgery which she attributes to using a wheelchair. She underwent R RTC surgery in September of 2021 and had physical therapy afterwards. She has been having issues with bilateral carpal tunnel and has a NCV study scheduled for tomorrow.                TREATMENT   Neuromuscular Re-education  NuStep L2 x 6 minutes BLE only for warm-up during interval history; Forward/backward gait in // bars without UE support x 4 lengths each direction; Side stepping in // bars without UE support x 4 lengths each direction; 6" step alternating toe taps x 15 BLE; Airex feet apart balance x 30s; Airex feet apart with ball toss/catch against the wall x 30s; Airex feet apart eyes closed x 30s; Airex feet together eyes open x 30s; Airex feet together with ball toss/catch against the wall  x 30s;  Mirror therapy performed with patient sitting at edge of mat table and mirror between legs to simulate right lower extremity.  While watching the mirror with LLE patient performed seated marches x 1 minute with 4# ankle weight, long arc quads with 4# ankle weight x 1 minute, L ankle AROM plantar flexion/dorsiflexion x 1 minute, L ankle ball circles CW/CCW x 1 minute;   Pt educated throughout session about proper posture and technique with exercises. Improved exercise technique, movement at target joints, use of target muscles after min to mod verbal, visual, tactile cues.      Patient demonstrates excellent motivation during session today. Continued to challenge balance and incorporated unstable Airex pad and dynamic activities such as ball throws. Also repeated mirror therapy to help with phantom limb pain which has been improving over the last couple weeks. Pt continues to demonstrate improvement in her R single leg stability and strength. Patient encouraged to continue HEP and follow-up as scheduled.  She will benefit from PT services to address deficits  in strength, balance, and mobility in order to return to full function at home.                            PT Short Term Goals - 05/11/21 1000       PT SHORT TERM GOAL #1   Title Pt will be independent with HEP in order to improve strength and balance in order to decrease fall risk and improve function at home.    Time 6    Period Weeks    Status On-going    Target Date 06/01/21               PT Long Term Goals - 05/11/21 1000       PT LONG TERM GOAL #1   Title Pt will improve BERG to at least 52/56 in order to demonstrate clinically significant improvement in balance and decrease risk for falls;    Baseline 01/26/21: 45/56; 03/02/21: 47/56; 04/20/21: 49/56    Time 12    Period Weeks    Status Partially Met    Target Date 07/13/21      PT LONG TERM GOAL #2   Title Pt will improve ABC by at least  13% in order to demonstrate clinically significant improvement in balance confidence.    Baseline 01/26/21: 57.5%; 03/02/21: 89.4%    Time 8    Period Weeks    Status Achieved      PT LONG TERM GOAL #3   Title Pt will improve FOTO to at least 63 in order to demonstrate clinically significant improvement in function related to her RLE amputation    Baseline 01/26/21: 59; 03/02/21: 71; 04/06/21: 71    Time 8    Period Weeks    Status Achieved      PT LONG TERM GOAL #4   Title Pt will increase self-selected 10MWT to at least 0.6 m/s in order to demonstrate clinically significant improvement in community ambulation.    Baseline 01/26/21: 31.5s = 0.32 m/s; 03/02/21: 25.5s = 0.39 m/s; 04/06/21: 20.7s = 0.48 m/s    Time 8    Period Weeks    Status Partially Met    Target Date 07/13/21                   Plan - 06/07/21 1212     Clinical Impression Statement Patient demonstrates excellent motivation during session today. Continued to challenge balance and incorporated unstable Airex pad and dynamic activities such as ball throws. Also repeated mirror therapy to help with phantom limb pain which has been improving over the last couple weeks. Pt continues to demonstrate improvement in her R single leg stability and strength. Patient encouraged to continue HEP and follow-up as scheduled.  She will benefit from PT services to address deficits in strength, balance, and mobility in order to return to full function at home.    Personal Factors and Comorbidities Age;Comorbidity 3+    Comorbidities Depression, osteoporosis, vascular disease    Examination-Activity Limitations Bathing;Locomotion Level;Squat;Stairs;Stand;Transfers    Examination-Participation Restrictions Cleaning;Community Activity;Meal Prep;Yard Work    Stability/Clinical Decision Making Unstable/Unpredictable    Rehab Potential Good    PT Frequency 2x / week    PT Duration 12 weeks    PT Treatment/Interventions ADLs/Self Care Home  Management;Aquatic Therapy;Canalith Repostioning;Cryotherapy;Electrical Stimulation;Iontophoresis 4mg /ml Dexamethasone;Moist Heat;Traction;Ultrasound;Gait training;Stair training;Therapeutic activities;Therapeutic exercise;Neuromuscular re-education;Manual techniques;Dry needling;Vestibular;Spinal Manipulations;Joint Manipulations    PT Next Visit Plan strengthening, practice gait training with  lofstrand crutches    PT Home Exercise Plan Access Code: [email or web address]    Consulted and Agree with Plan of Care Patient                 Patient will benefit from skilled therapeutic intervention in order to improve the following deficits and impairments:  Abnormal gait, Decreased strength, Difficulty walking  Visit Diagnosis: Muscle weakness (generalized)  Difficulty in walking, not elsewhere classified     Problem List Patient Active Problem List   Diagnosis Date Noted   Muscle spasm 09/09/2020   S/P AKA (above knee amputation), right ([place]) 09/08/2020   Ischemia of extremity 07/02/2020   Chronic pain in right shoulder 11/25/2019   Encounter for long-term (current) use of high-risk medication 07/16/2019   GCA (giant cell arteritis) ([place]) 07/16/2019   Temporal arteritis ([place]) 07/13/2019   Postoperative wound infection 06/23/2019   Ischemic leg 05/31/2019   Atherosclerosis of native arteries of extremity with intermittent claudication ([place]) 05/30/2019   Depression 05/29/2019   Eczema of lower extremity 03/04/2018   Chronic venous insufficiency 03/02/2018   Personal history of colonic polyps    Family history of colonic polyps    Benign neoplasm of ascending colon    Mitral valve insufficiency 02/08/2017   Dyspnea on exertion 02/07/2017   Non-rheumatic mitral regurgitation 02/07/2017   Precordial pain 02/07/2017   CAD (coronary artery disease) 12/21/2016   Essential hypertension 12/21/2016   Compression fracture of lumbar spine, non-traumatic ([place]) 04/26/2016   Compression fracture  of L3 lumbar vertebra 04/20/2016   PVD (peripheral vascular disease) ([place]) 02/24/2016   Family history of premature CAD 02/24/2016   Gastritis    Other specified diseases of esophagus    Hiatal hernia    Gastritis and gastroduodenitis    Ischemia of lower extremity    Arterial occlusion ([facility])    Atherosclerosis of aorta ([place])    History of smoking    Hyperlipidemia    Pain in the chest    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 02/05/2016   Carotid artery stenosis 01/04/2016   Vertigo, benign paroxysmal 12/28/2015   Clinical depression 09/06/2015   History of alcoholism ([place]) 09/06/2015   Angiopathy, 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[place]) 09/06/2015   Atherosclerosis of native arteries of extremity with rest pain ([place]) 09/06/2015   Acute non-recurrent maxillary sinusitis 07/14/2015   Vaginal pruritus 05/26/2015   Chronic recurrent major depressive disorder ([place]) 03/09/2015   Osteoporosis, post-menopausal 03/09/2015   Peripheral blood vessel disorder ([place]) 03/09/2015   Acid reflux 03/09/2015   GERD (gastroesophageal reflux disease) 12/21/2014   Carotid artery narrowing 01/28/2014   Peripheral arterial occlusive disease ([place]) 06/08/2011   Occlusion and stenosis of unspecified carotid artery 06/08/2011   PAD (peripheral artery disease) ([place]) 06/08/2011   Lyndel Safe Lindi Abram PT, DPT, GCS  Kamori Kitchens 06/07/2021, 12:15 PM  [facility] [address] [place], [place], [address] Phone: [phone]   Fax:  [phone]  Name: Sara [name] MRN: [id number] Date of Birth: 08/28/1950

## 2021-06-10 ENCOUNTER — Other Ambulatory Visit: Payer: Self-pay

## 2021-06-10 ENCOUNTER — Ambulatory Visit: Payer: Medicare Other

## 2021-06-10 VITALS — BP 131/74 | HR 93

## 2021-06-10 DIAGNOSIS — R262 Difficulty in walking, not elsewhere classified: Secondary | ICD-10-CM

## 2021-06-10 DIAGNOSIS — R2681 Unsteadiness on feet: Secondary | ICD-10-CM

## 2021-06-10 DIAGNOSIS — M6281 Muscle weakness (generalized): Secondary | ICD-10-CM | POA: Diagnosis not present

## 2021-06-10 NOTE — Therapy (Signed)
[facility] [address] [place], [place], [address] Phone: [phone]   Fax:  [phone]  Physical Therapy Treatment  Patient Details  Name: Sara [name] MRN: [id number] Date of Birth: 1951-06-06 Referring Provider (PT): Dr. Delana Meyer   Encounter Date: 06/10/2021   PT End of Session - 06/10/21 1046     Visit Number 39    Number of Visits 30    Date for PT Re-Evaluation 07/13/21    Authorization Type eval: 01/26/21    PT Start Time 1010    PT Stop Time 1055    PT Time Calculation (min) 45 min    Equipment Utilized During Treatment Gait belt    Activity Tolerance Patient tolerated treatment well    Behavior During Therapy [employer] for tasks assessed/performed                 Past Medical History:  Diagnosis Date   Arthritis    right shoulder   Depression    Dyspnea    GERD (gastroesophageal reflux disease)    History of blood clots    Hyperlipidemia    Hypertension    Mild mitral regurgitation    Osteoporosis    Peripheral vascular disease ([place])    Stomach ulcer    Vascular disease    Sees Dr. Delana Meyer   Vertigo    Last episode approx Aug 2015   Wears dentures    full upper    Past Surgical History:  Procedure Laterality Date   ADENOIDECTOMY     AMPUTATION Right 08/11/2020   Procedure: AMPUTATION ABOVE KNEE;  Surgeon: [name], MD;  Location: [facility];  Service: Vascular;  Laterality: Right;   ARTERY BIOPSY Right 07/14/2019   Procedure: BIOPSY TEMPORAL ARTERY;  Surgeon: [name], MD;  Location: [facility];  Service: Vascular;  Laterality: Right;   BREAST CYST ASPIRATION Left    CARDIAC CATHETERIZATION  02/15/2017   [facility]   COLONOSCOPY WITH PROPOFOL N/A 10/22/2017   Procedure: COLONOSCOPY WITH PROPOFOL;  Surgeon: Lucilla Lame, MD;  Location: [place];  Service: Endoscopy;  Laterality: N/A;  specimens not taken--pt on Plavix will be brought back in after 7 days off med   COLONOSCOPY  WITH PROPOFOL N/A 11/05/2017   Procedure: COLONOSCOPY WITH PROPOFOL;  Surgeon: Lucilla Lame, MD;  Location: [place];  Service: Endoscopy;  Laterality: N/A;   ESOPHAGOGASTRODUODENOSCOPY N/A 12/21/2014   Procedure: ESOPHAGOGASTRODUODENOSCOPY (EGD);  Surgeon: Lucilla Lame, MD;  Location: [place];  Service: Gastroenterology;  Laterality: N/A;   ESOPHAGOGASTRODUODENOSCOPY (EGD) WITH PROPOFOL N/A 02/08/2016   Procedure: ESOPHAGOGASTRODUODENOSCOPY (EGD) WITH PROPOFOL;  Surgeon: Lucilla Lame, MD;  Location: [facility];  Service: Endoscopy;  Laterality: N/A;   FASCIOTOMY Right 05/30/2019   Procedure: FASCIOTOMY;  Surgeon: [name], MD;  Location: [facility];  Service: Vascular;  Laterality: Right;   FASCIOTOMY CLOSURE Right 06/04/2019   Procedure: FASCIOTOMY CLOSURE;  Surgeon: [name], MD;  Location: [facility];  Service: Vascular;  Laterality: Right;   HEMORROIDECTOMY  2014   LOWER EXTREMITY ANGIOGRAPHY Right 05/30/2019   Procedure: LOWER EXTREMITY ANGIOGRAPHY;  Surgeon: [name], MD;  Location: [place] [facility];  Service: Cardiovascular;  Laterality: Right;   LOWER EXTREMITY ANGIOGRAPHY Right 07/02/2020   Procedure: LOWER EXTREMITY ANGIOGRAPHY;  Surgeon: [name], MD;  Location: [place] [facility];  Service: Cardiovascular;  Laterality: Right;   PERIPHERAL VASCULAR CATHETERIZATION N/A 02/09/2016   Procedure: Abdominal Aortogram w/Lower  Extremity;  Surgeon: [name], MD;  Location: [place] [facility];  Service: Cardiovascular;  Laterality: N/A;   PERIPHERAL VASCULAR CATHETERIZATION Right 02/10/2016   Procedure: Lower Extremity Angiography;  Surgeon: [name], MD;  Location: [place] [facility];  Service: Cardiovascular;  Laterality: Right;   POLYPECTOMY  11/05/2017   Procedure: POLYPECTOMY;  Surgeon: Lucilla Lame, MD;  Location: [place];  Service: Endoscopy;;   SHOULDER ARTHROSCOPY WITH ROTATOR CUFF REPAIR AND  SUBACROMIAL DECOMPRESSION Right 02/27/2020   Procedure: RIGHT SHOULDER ARTHROSCOPY SUBACROMIAL DECOMPRESSION, DISTAL CLAVICLE EXCISION AND MINI-OPEN ROTATOR CUFF REPAIR;  Surgeon: Thornton Park, MD;  Location: [facility];  Service: Orthopedics;  Laterality: Right;   TONSILLECTOMY     VASCULAR SURGERY  1991,2012   Fem-Pop Bypass    Vitals:   06/10/21 1051  BP: 131/74  Pulse: 93  SpO2: 97%      Subjective Assessment - 06/10/21 1045     Subjective Patient fell yesterday when going into the doctor's office. She didn't injure herself and was able to get up from the ground with one lofstrand crutch and no external assistance. No pain reported today. She continues with significant phantom limb pain. No specific questions currently.    Pertinent History Pt has a history of RLE vascular issues since 1991. 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 She received her RLE prosthetic in April 2022 and has been receiving [facility] since her surgery. They discharged her yesterday to continue with OP PT. She continues with phantom RLE pain, especially at night and takes gabapentin with minimal relief. She has also been having R shoulder pain since the surgery which she attributes to using a wheelchair. She underwent R RTC surgery in September of 2021 and had physical therapy afterwards. She has been having issues with bilateral carpal tunnel and has a NCV study scheduled for tomorrow.    Currently in Pain? No/denies                TREATMENT   Ther-ex  With prosthetic doffed: Supine R SLR hip flexion with manual resistance from therapist 2 x 20; Supine RLE bridge with residual RLE resting on stool (towel on top for cushion), arms at side, 3s hold, 2 x 20; Supine RLE long axis manually resisted external and internal rotation 2 x 20 each; Supine RLE adduction with manual resistance 2 x 20; L sidelying R hip abduction with manual resistance 2 x 20; L sidelying R  hip extension from 90 hip flexion to available end range extension with manual resistance 2 x 20; L sidelying R hip flexor stretch 2 x 30s;   Neuromuscular Re-education  NuStep L2 x 5 minutes BLE only for warm-up during interval history;  Mirror therapy performed with patient sitting at edge of mat table and mirror between legs to simulate right lower extremity.  While watching the mirror with LLE patient performed seated marches x 1 minute with 3# ankle weight, long arc quads with 3# ankle weight x 1 minute, L ankle AROM plantar flexion/dorsiflexion x 1 minute, L ankle ball circles CW/CCW x 1 minute;   Pt educated throughout session about proper posture and technique with exercises. Improved exercise technique, movement at target joints, use of target muscles after min to mod verbal, visual, tactile cues.      Patient demonstrates excellent motivation during session today. Session today focused on RLE strengthening. Continued with mirror therapy to help with phantom limb pain. Patient encouraged  to continue HEP and follow-up as scheduled.  She will benefit from PT services to address deficits in strength, balance, and mobility in order to return to full function at home.                            PT Short Term Goals - 05/11/21 1000       PT SHORT TERM GOAL #1   Title Pt will be independent with HEP in order to improve strength and balance in order to decrease fall risk and improve function at home.    Time 6    Period Weeks    Status On-going    Target Date 06/01/21               PT Long Term Goals - 05/11/21 1000       PT LONG TERM GOAL #1   Title Pt will improve BERG to at least 52/56 in order to demonstrate clinically significant improvement in balance and decrease risk for falls;    Baseline 01/26/21: 45/56; 03/02/21: 47/56; 04/20/21: 49/56    Time 12    Period Weeks    Status Partially Met    Target Date 07/13/21      PT LONG TERM GOAL #2   Title  Pt will improve ABC by at least 13% in order to demonstrate clinically significant improvement in balance confidence.    Baseline 01/26/21: 57.5%; 03/02/21: 89.4%    Time 8    Period Weeks    Status Achieved      PT LONG TERM GOAL #3   Title Pt will improve FOTO to at least 63 in order to demonstrate clinically significant improvement in function related to her RLE amputation    Baseline 01/26/21: 59; 03/02/21: 71; 04/06/21: 71    Time 8    Period Weeks    Status Achieved      PT LONG TERM GOAL #4   Title Pt will increase self-selected 10MWT to at least 0.6 m/s in order to demonstrate clinically significant improvement in community ambulation.    Baseline 01/26/21: 31.5s = 0.32 m/s; 03/02/21: 25.5s = 0.39 m/s; 04/06/21: 20.7s = 0.48 m/s    Time 8    Period Weeks    Status Partially Met    Target Date 07/13/21                   Plan - 06/10/21 1046     Clinical Impression Statement Patient demonstrates excellent motivation during session today. Session today focused on RLE strengthening. Continued with mirror therapy to help with phantom limb pain. Patient encouraged to continue HEP and follow-up as scheduled.  She will benefit from PT services to address deficits in strength, balance, and mobility in order to return to full function at home.    Personal Factors and Comorbidities Age;Comorbidity 3+    Comorbidities Depression, osteoporosis, vascular disease    Examination-Activity Limitations Bathing;Locomotion Level;Squat;Stairs;Stand;Transfers    Examination-Participation Restrictions Cleaning;Community Activity;Meal Prep;Yard Work    Stability/Clinical Decision Making Unstable/Unpredictable    Rehab Potential Good    PT Frequency 2x / week    PT Duration 12 weeks    PT Treatment/Interventions ADLs/Self Care Home Management;Aquatic Therapy;Canalith Repostioning;Cryotherapy;Electrical Stimulation;Iontophoresis 79m/ml Dexamethasone;Moist Heat;Traction;Ultrasound;Gait training;Stair  training;Therapeutic activities;Therapeutic exercise;Neuromuscular re-education;Manual techniques;Dry needling;Vestibular;Spinal Manipulations;Joint Manipulations    PT Next Visit Plan Update outcome measures/goals/progress note, strengthening, practice gait training with lofstrand crutches    PT Home Exercise Plan Access Code:  [email or web address]    Consulted and Agree with Plan of Care Patient                 Patient will benefit from skilled therapeutic intervention in order to improve the following deficits and impairments:  Abnormal gait, Decreased strength, Difficulty walking  Visit Diagnosis: Muscle weakness (generalized)  Difficulty in walking, not elsewhere classified  Unsteadiness on feet     Problem List Patient Active Problem List   Diagnosis Date Noted   Muscle spasm 09/09/2020   S/P AKA (above knee amputation), right ([place]) 09/08/2020   Ischemia of extremity 07/02/2020   Chronic pain in right shoulder 11/25/2019   Encounter for long-term (current) use of high-risk medication 07/16/2019   GCA (giant cell arteritis) ([place]) 07/16/2019   Temporal arteritis ([place]) 07/13/2019   Postoperative wound infection 06/23/2019   Ischemic leg 05/31/2019   Atherosclerosis of native arteries of extremity with intermittent claudication ([place]) 05/30/2019   Depression 05/29/2019   Eczema of lower extremity 03/04/2018   Chronic venous insufficiency 03/02/2018   Personal history of colonic polyps    Family history of colonic polyps    Benign neoplasm of ascending colon    Mitral valve insufficiency 02/08/2017   Dyspnea on exertion 02/07/2017   Non-rheumatic mitral regurgitation 02/07/2017   Precordial pain 02/07/2017   CAD (coronary artery disease) 12/21/2016   Essential hypertension 12/21/2016   Compression fracture of lumbar spine, non-traumatic ([place]) 04/26/2016   Compression fracture of L3 lumbar vertebra 04/20/2016   PVD (peripheral vascular disease) ([place]) 02/24/2016   Family  history of premature CAD 02/24/2016   Gastritis    Other specified diseases of esophagus    Hiatal hernia    Gastritis and gastroduodenitis    Ischemia of lower extremity    Arterial occlusion ([facility])    Atherosclerosis of aorta ([place])    History of smoking    Hyperlipidemia    Pain in the chest    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 02/05/2016   Carotid artery stenosis 01/04/2016   Vertigo, benign paroxysmal 12/28/2015   Clinical depression 09/06/2015   History of alcoholism ([place]) 09/06/2015   Angiopathy, 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[place]) 09/06/2015   Atherosclerosis of native arteries of extremity with rest pain ([place]) 09/06/2015   Acute non-recurrent maxillary sinusitis 07/14/2015   Vaginal pruritus 05/26/2015   Chronic recurrent major depressive disorder ([place]) 03/09/2015   Osteoporosis, post-menopausal 03/09/2015   Peripheral blood vessel disorder ([place]) 03/09/2015   Acid reflux 03/09/2015   GERD (gastroesophageal reflux disease) 12/21/2014   Carotid artery narrowing 01/28/2014   Peripheral arterial occlusive disease ([place]) 06/08/2011   Occlusion and stenosis of unspecified carotid artery 06/08/2011   PAD (peripheral artery disease) ([place]) 06/08/2011   Phillips Grout PT, DPT, GCS  Earley Grobe 06/10/2021, 10:41 PM  [facility] [address] [place], [place], [address] Phone: [phone]   Fax:  [phone]  Name: Sara [name] MRN: [id number] Date of Birth: Apr 01, 1951

## 2021-06-14 ENCOUNTER — Ambulatory Visit: Payer: Medicare Other

## 2021-06-15 ENCOUNTER — Other Ambulatory Visit: Payer: Self-pay

## 2021-06-15 ENCOUNTER — Ambulatory Visit: Payer: Medicare Other

## 2021-06-15 DIAGNOSIS — M6281 Muscle weakness (generalized): Secondary | ICD-10-CM | POA: Diagnosis not present

## 2021-06-15 DIAGNOSIS — R2681 Unsteadiness on feet: Secondary | ICD-10-CM

## 2021-06-15 DIAGNOSIS — R262 Difficulty in walking, not elsewhere classified: Secondary | ICD-10-CM

## 2021-06-15 NOTE — Therapy (Signed)
[facility] [address] [place], [place], [address] Phone: [phone]   Fax:  [phone]  Physical Therapy Progress Note  Dates of reporting period  05/11/21   to   06/15/21  Patient Details  Name: Sara [name] MRN: [id number] Date of Birth: Aug 28, 1950 Referring Provider (PT): Dr. Delana Meyer   Encounter Date: 06/15/2021   PT End of Session - 06/15/21 1125     Visit Number 40    Number of Visits 70    Date for PT Re-Evaluation 07/13/21    Authorization Type eval: 01/26/21    PT Start Time 0935    PT Stop Time 1015    PT Time Calculation (min) 40 min    Equipment Utilized During Treatment Gait belt    Activity Tolerance Patient tolerated treatment well    Behavior During Therapy [facility] for tasks assessed/performed                 Past Medical History:  Diagnosis Date   Arthritis    right shoulder   Depression    Dyspnea    GERD (gastroesophageal reflux disease)    History of blood clots    Hyperlipidemia    Hypertension    Mild mitral regurgitation    Osteoporosis    Peripheral vascular disease ([place])    Stomach ulcer    Vascular disease    Sees Dr. Delana Meyer   Vertigo    Last episode approx Aug 2015   Wears dentures    full upper    Past Surgical History:  Procedure Laterality Date   ADENOIDECTOMY     AMPUTATION Right 08/11/2020   Procedure: AMPUTATION ABOVE KNEE;  Surgeon: [name], MD;  Location: [facility];  Service: Vascular;  Laterality: Right;   ARTERY BIOPSY Right 07/14/2019   Procedure: BIOPSY TEMPORAL ARTERY;  Surgeon: [name], MD;  Location: [facility];  Service: Vascular;  Laterality: Right;   BREAST CYST ASPIRATION Left    CARDIAC CATHETERIZATION  02/15/2017   [facility]   COLONOSCOPY WITH PROPOFOL N/A 10/22/2017   Procedure: COLONOSCOPY WITH PROPOFOL;  Surgeon: Lucilla Lame, MD;  Location: [place];  Service: Endoscopy;  Laterality: N/A;  specimens not taken--pt on Plavix  will be brought back in after 7 days off med   COLONOSCOPY WITH PROPOFOL N/A 11/05/2017   Procedure: COLONOSCOPY WITH PROPOFOL;  Surgeon: Lucilla Lame, MD;  Location: [place];  Service: Endoscopy;  Laterality: N/A;   ESOPHAGOGASTRODUODENOSCOPY N/A 12/21/2014   Procedure: ESOPHAGOGASTRODUODENOSCOPY (EGD);  Surgeon: Lucilla Lame, MD;  Location: [place];  Service: Gastroenterology;  Laterality: N/A;   ESOPHAGOGASTRODUODENOSCOPY (EGD) WITH PROPOFOL N/A 02/08/2016   Procedure: ESOPHAGOGASTRODUODENOSCOPY (EGD) WITH PROPOFOL;  Surgeon: Lucilla Lame, MD;  Location: [facility];  Service: Endoscopy;  Laterality: N/A;   FASCIOTOMY Right 05/30/2019   Procedure: FASCIOTOMY;  Surgeon: [name], MD;  Location: [facility];  Service: Vascular;  Laterality: Right;   FASCIOTOMY CLOSURE Right 06/04/2019   Procedure: FASCIOTOMY CLOSURE;  Surgeon: [name], MD;  Location: [facility];  Service: Vascular;  Laterality: Right;   HEMORROIDECTOMY  2014   LOWER EXTREMITY ANGIOGRAPHY Right 05/30/2019   Procedure: LOWER EXTREMITY ANGIOGRAPHY;  Surgeon: [name], MD;  Location: [place] [facility];  Service: Cardiovascular;  Laterality: Right;   LOWER EXTREMITY ANGIOGRAPHY Right 07/02/2020   Procedure: LOWER EXTREMITY ANGIOGRAPHY;  Surgeon: [name], MD;  Location: [place] [facility];  Service: Cardiovascular;  Laterality:  Right;   PERIPHERAL VASCULAR CATHETERIZATION N/A 02/09/2016   Procedure: Abdominal Aortogram w/Lower Extremity;  Surgeon: [name], MD;  Location: [place] [facility];  Service: Cardiovascular;  Laterality: N/A;   PERIPHERAL VASCULAR CATHETERIZATION Right 02/10/2016   Procedure: Lower Extremity Angiography;  Surgeon: [name], MD;  Location: [place] [facility];  Service: Cardiovascular;  Laterality: Right;   POLYPECTOMY  11/05/2017   Procedure: POLYPECTOMY;  Surgeon: Lucilla Lame, MD;  Location: [place];  Service:  Endoscopy;;   SHOULDER ARTHROSCOPY WITH ROTATOR CUFF REPAIR AND SUBACROMIAL DECOMPRESSION Right 02/27/2020   Procedure: RIGHT SHOULDER ARTHROSCOPY SUBACROMIAL DECOMPRESSION, DISTAL CLAVICLE EXCISION AND MINI-OPEN ROTATOR CUFF REPAIR;  Surgeon: Thornton Park, MD;  Location: [facility];  Service: Orthopedics;  Laterality: Right;   TONSILLECTOMY     VASCULAR SURGERY  [id number]   Fem-Pop Bypass    There were no vitals filed for this visit.     Subjective Assessment - 06/15/21 1123     Subjective Patient has been having a lot of pain in her right shoulder recently so she took 2 oxycodone before coming to therapy.  She denies any resting pain upon arrival.  No recent falls reported.  She continues with significant phantom right lower extremity pain.  No specific questions upon arrival.    Pertinent History Pt has a history of RLE vascular issues since 1991. 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 She received her RLE prosthetic in April 2022 and has been receiving [place] PT since her surgery. They discharged her yesterday to continue with OP PT. She continues with phantom RLE pain, especially at night and takes gabapentin with minimal relief. She has also been having R shoulder pain since the surgery which she attributes to using a wheelchair. She underwent R RTC surgery in September of 2021 and had physical therapy afterwards. She has been having issues with bilateral carpal tunnel and has a NCV study scheduled for tomorrow.                TREATMENT   Neuromuscular Re-education  NuStep L0-1 x 5 minutes BLE only for warm-up during interval history (2 minutes unbilled); Obstacle course in // bars without UE support stepping over 3" Airex pad, and 2, 6" hurdles x 4 lengths; Forward/backward gait in // bars without UE support x 4 lengths each direction; Side stepping in // bars without UE support x 4 lengths each direction; 6" step alternating toe taps x  15 BLE; Airex feet apart balance x 30s; Airex feet apart eyes closed x 30s; Airex feet together eyes open x 30s;   Pt educated throughout session about proper posture and technique with exercises. Improved exercise technique, movement at target joints, use of target muscles after min to mod verbal, visual, tactile cues.      Patient demonstrates excellent motivation during session today. Session today focused on balance.  Patient fatigues easier today and her balance is more impaired due to taking 2 oxycodone prior to arrival.  Additional seated rest breaks provided during session today.  Given fatigue and impaired balance from oxycodone outcome measures not repeated today but will defer to next session.  Overall she is making excellent progress with therapy and is now ambulating consistently with only 1 Lofstrand crutch in LUE. Patient encouraged to continue HEP and follow-up as scheduled.  She will benefit from PT services to address deficits in strength, balance, and mobility in order to return to full function at  home.                            PT Short Term Goals - 06/15/21 1137       PT SHORT TERM GOAL #1   Title Pt will be independent with HEP in order to improve strength and balance in order to decrease fall risk and improve function at home.    Time 6    Period Weeks    Status On-going    Target Date 06/01/21               PT Long Term Goals - 06/15/21 1137       PT LONG TERM GOAL #1   Title Pt will improve BERG to at least 52/56 in order to demonstrate clinically significant improvement in balance and decrease risk for falls;    Baseline 01/26/21: 45/56; 03/02/21: 47/56; 04/20/21: 49/56    Time 12    Period Weeks    Status Partially Met    Target Date 07/13/21      PT LONG TERM GOAL #2   Title Pt will improve ABC by at least 13% in order to demonstrate clinically significant improvement in balance confidence.    Baseline 01/26/21: 57.5%; 03/02/21:  89.4%    Time 8    Period Weeks    Status Achieved      PT LONG TERM GOAL #3   Title Pt will improve FOTO to at least 63 in order to demonstrate clinically significant improvement in function related to her RLE amputation    Baseline 01/26/21: 59; 03/02/21: 71; 04/06/21: 71    Time 8    Period Weeks    Status Achieved      PT LONG TERM GOAL #4   Title Pt will increase self-selected 10MWT to at least 0.6 m/s in order to demonstrate clinically significant improvement in community ambulation.    Baseline 01/26/21: 31.5s = 0.32 m/s; 03/02/21: 25.5s = 0.39 m/s; 04/06/21: 20.7s = 0.48 m/s    Time 8    Period Weeks    Status Partially Met    Target Date 07/13/21                   Plan - 06/15/21 1125     Clinical Impression Statement Patient demonstrates excellent motivation during session today. Session today focused on balance.  Patient fatigues easier today and her balance is more impaired due to taking 2 oxycodone prior to arrival.  Additional seated rest breaks provided during session today.  Given fatigue and impaired balance from oxycodone outcome measures not repeated today but will defer to next session.  Overall she is making excellent progress with therapy and is now ambulating consistently with only 1 Lofstrand crutch in LUE. Patient encouraged to continue HEP and follow-up as scheduled.  She will benefit from PT services to address deficits in strength, balance, and mobility in order to return to full function at home.    Personal Factors and Comorbidities Age;Comorbidity 3+    Comorbidities Depression, osteoporosis, vascular disease    Examination-Activity Limitations Bathing;Locomotion Level;Squat;Stairs;Stand;Transfers    Examination-Participation Restrictions Cleaning;Community Activity;Meal Prep;Yard Work    Stability/Clinical Decision Making Unstable/Unpredictable    Rehab Potential Good    PT Frequency 2x / week    PT Duration 12 weeks    PT Treatment/Interventions  ADLs/Self Care Home Management;Aquatic Therapy;Canalith Repostioning;Cryotherapy;Electrical Stimulation;Iontophoresis 57m/ml Dexamethasone;Moist Heat;Traction;Ultrasound;Gait training;Stair training;Therapeutic activities;Therapeutic exercise;Neuromuscular re-education;Manual techniques;Dry needling;Vestibular;Spinal Manipulations;Joint Manipulations  PT Next Visit Plan Update outcome measures/goals, strengthening, practice gait training with lofstrand crutches    PT Home Exercise Plan Access Code: [email or web address]    Consulted and Agree with Plan of Care Patient                 Patient will benefit from skilled therapeutic intervention in order to improve the following deficits and impairments:  Abnormal gait, Decreased strength, Difficulty walking  Visit Diagnosis: Muscle weakness (generalized)  Difficulty in walking, not elsewhere classified  Unsteadiness on feet     Problem List Patient Active Problem List   Diagnosis Date Noted   Muscle spasm 09/09/2020   S/P AKA (above knee amputation), right ([place]) 09/08/2020   Ischemia of extremity 07/02/2020   Chronic pain in right shoulder 11/25/2019   Encounter for long-term (current) use of high-risk medication 07/16/2019   GCA (giant cell arteritis) ([place]) 07/16/2019   Temporal arteritis ([place]) 07/13/2019   Postoperative wound infection 06/23/2019   Ischemic leg 05/31/2019   Atherosclerosis of native arteries of extremity with intermittent claudication ([place]) 05/30/2019   Depression 05/29/2019   Eczema of lower extremity 03/04/2018   Chronic venous insufficiency 03/02/2018   Personal history of colonic polyps    Family history of colonic polyps    Benign neoplasm of ascending colon    Mitral valve insufficiency 02/08/2017   Dyspnea on exertion 02/07/2017   Non-rheumatic mitral regurgitation 02/07/2017   Precordial pain 02/07/2017   CAD (coronary artery disease) 12/21/2016   Essential hypertension 12/21/2016   Compression  fracture of lumbar spine, non-traumatic ([place]) 04/26/2016   Compression fracture of L3 lumbar vertebra 04/20/2016   PVD (peripheral vascular disease) ([place]) 02/24/2016   Family history of premature CAD 02/24/2016   Gastritis    Other specified diseases of esophagus    Hiatal hernia    Gastritis and gastroduodenitis    Ischemia of lower extremity    Arterial occlusion (H[facility])    Atherosclerosis of aorta ([place])    History of smoking    Hyperlipidemia    Pain in the chest    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 02/05/2016   Carotid artery stenosis 01/04/2016   Vertigo, benign paroxysmal 12/28/2015   Clinical depression 09/06/2015   History of alcoholism ([place]) 09/06/2015   Angiopathy, 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[place]) 09/06/2015   Atherosclerosis of native arteries of extremity with rest pain ([place]) 09/06/2015   Acute non-recurrent maxillary sinusitis 07/14/2015   Vaginal pruritus 05/26/2015   Chronic recurrent major depressive disorder ([place]) 03/09/2015   Osteoporosis, post-menopausal 03/09/2015   Peripheral blood vessel disorder ([place]) 03/09/2015   Acid reflux 03/09/2015   GERD (gastroesophageal reflux disease) 12/21/2014   Carotid artery narrowing 01/28/2014   Peripheral arterial occlusive disease ([place]) 06/08/2011   Occlusion and stenosis of unspecified carotid artery 06/08/2011   PAD (peripheral artery disease) ([place]) 06/08/2011   Phillips Grout PT, DPT, GCS  Elleanna Melling 06/15/2021, 11:37 AM  [facility] [address] [place], [place], 06/26/1951 Phone: [phone]   Fax:  [phone]  Name: Sara [name] MRN: [id number] Date of Birth: May 04, 1951

## 2021-06-17 ENCOUNTER — Other Ambulatory Visit: Payer: Self-pay

## 2021-06-17 ENCOUNTER — Ambulatory Visit: Payer: Medicare Other

## 2021-06-17 DIAGNOSIS — M6281 Muscle weakness (generalized): Secondary | ICD-10-CM | POA: Diagnosis not present

## 2021-06-17 DIAGNOSIS — R262 Difficulty in walking, not elsewhere classified: Secondary | ICD-10-CM

## 2021-06-17 DIAGNOSIS — R2681 Unsteadiness on feet: Secondary | ICD-10-CM

## 2021-06-17 NOTE — Therapy (Signed)
[facility] [address] [place], [place], [address] Phone: [phone]   Fax:  [phone]  Physical Therapy Treatment/Goal Update   Patient Details  Name: Sara [name] MRN: [id number] Date of Birth: 1951/06/01 Referring Provider (PT): Dr. Delana Meyer   Encounter Date: 06/17/2021   PT End of Session - 06/17/21 1102     Visit Number 41    Number of Visits 6    Date for PT Re-Evaluation 07/13/21    Authorization Type eval: 01/26/21    PT Start Time 1058    PT Stop Time 1143    PT Time Calculation (min) 45 min    Equipment Utilized During Treatment Gait belt    Activity Tolerance Patient tolerated treatment well    Behavior During Therapy [facility] for tasks assessed/performed                 Past Medical History:  Diagnosis Date   Arthritis    right shoulder   Depression    Dyspnea    GERD (gastroesophageal reflux disease)    History of blood clots    Hyperlipidemia    Hypertension    Mild mitral regurgitation    Osteoporosis    Peripheral vascular disease ([place])    Stomach ulcer    Vascular disease    Sees Dr. Delana Meyer   Vertigo    Last episode approx Aug 2015   Wears dentures    full upper    Past Surgical History:  Procedure Laterality Date   ADENOIDECTOMY     AMPUTATION Right 08/11/2020   Procedure: AMPUTATION ABOVE KNEE;  Surgeon: [name], MD;  Location: [facility];  Service: Vascular;  Laterality: Right;   ARTERY BIOPSY Right 07/14/2019   Procedure: BIOPSY TEMPORAL ARTERY;  Surgeon: [name], MD;  Location: [facility];  Service: Vascular;  Laterality: Right;   BREAST CYST ASPIRATION Left    CARDIAC CATHETERIZATION  02/15/2017   [facility]   COLONOSCOPY WITH PROPOFOL N/A 10/22/2017   Procedure: COLONOSCOPY WITH PROPOFOL;  Surgeon: Lucilla Lame, MD;  Location: [place];  Service: Endoscopy;  Laterality: N/A;  specimens not taken--pt on Plavix will be brought back in after 7 days off med    COLONOSCOPY WITH PROPOFOL N/A 11/05/2017   Procedure: COLONOSCOPY WITH PROPOFOL;  Surgeon: Lucilla Lame, MD;  Location: [place];  Service: Endoscopy;  Laterality: N/A;   ESOPHAGOGASTRODUODENOSCOPY N/A 12/21/2014   Procedure: ESOPHAGOGASTRODUODENOSCOPY (EGD);  Surgeon: Lucilla Lame, MD;  Location: [place];  Service: Gastroenterology;  Laterality: N/A;   ESOPHAGOGASTRODUODENOSCOPY (EGD) WITH PROPOFOL N/A 02/08/2016   Procedure: ESOPHAGOGASTRODUODENOSCOPY (EGD) WITH PROPOFOL;  Surgeon: Lucilla Lame, MD;  Location: [facility];  Service: Endoscopy;  Laterality: N/A;   FASCIOTOMY Right 05/30/2019   Procedure: FASCIOTOMY;  Surgeon: [name], MD;  Location: [facility];  Service: Vascular;  Laterality: Right;   FASCIOTOMY CLOSURE Right 06/04/2019   Procedure: FASCIOTOMY CLOSURE;  Surgeon: [name], MD;  Location: [facility];  Service: Vascular;  Laterality: Right;   HEMORROIDECTOMY  2014   LOWER EXTREMITY ANGIOGRAPHY Right 05/30/2019   Procedure: LOWER EXTREMITY ANGIOGRAPHY;  Surgeon: [name], MD;  Location: [place] [facility];  Service: Cardiovascular;  Laterality: Right;   LOWER EXTREMITY ANGIOGRAPHY Right 07/02/2020   Procedure: LOWER EXTREMITY ANGIOGRAPHY;  Surgeon: [name], MD;  Location: [place] [facility];  Service: Cardiovascular;  Laterality: Right;   PERIPHERAL VASCULAR CATHETERIZATION N/A 02/09/2016   Procedure: Abdominal  Aortogram w/Lower Extremity;  Surgeon: [name], MD;  Location: [place] [facility];  Service: Cardiovascular;  Laterality: N/A;   PERIPHERAL VASCULAR CATHETERIZATION Right 02/10/2016   Procedure: Lower Extremity Angiography;  Surgeon: [name], MD;  Location: [place] [facility];  Service: Cardiovascular;  Laterality: Right;   POLYPECTOMY  11/05/2017   Procedure: POLYPECTOMY;  Surgeon: Lucilla Lame, MD;  Location: [place];  Service: Endoscopy;;   SHOULDER ARTHROSCOPY WITH ROTATOR  CUFF REPAIR AND SUBACROMIAL DECOMPRESSION Right 02/27/2020   Procedure: RIGHT SHOULDER ARTHROSCOPY SUBACROMIAL DECOMPRESSION, DISTAL CLAVICLE EXCISION AND MINI-OPEN ROTATOR CUFF REPAIR;  Surgeon: Thornton Park, MD;  Location: [facility];  Service: Orthopedics;  Laterality: Right;   TONSILLECTOMY     VASCULAR SURGERY  9811,[id number]   Fem-Pop Bypass    There were no vitals filed for this visit.     Subjective Assessment - 06/17/21 1101     Subjective Pt reports that her R shoulder pain is significantly better today than it was earlier this week. She continues to have distal RLE pain as well as phantom limb pain.  She denies any resting pain upon arrival.  No recent falls reported.  No specific questions upon arrival.    Pertinent History Pt has a history of RLE vascular issues since 1991. 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 She received her RLE prosthetic in April 2022 and has been receiving [facility] since her surgery. They discharged her yesterday to continue with OP PT. She continues with phantom RLE pain, especially at night and takes gabapentin with minimal relief. She has also been having R shoulder pain since the surgery which she attributes to using a wheelchair. She underwent R RTC surgery in September of 2021 and had physical therapy afterwards. She has been having issues with bilateral carpal tunnel and has a NCV study scheduled for tomorrow.                TREATMENT   Neuromuscular Re-education  NuStep L0-1 x 5 minutes BLE only for warm-up during interval history (2 minutes unbilled);  Updated outcome measures and goals with patient: FOTO: 59 BERG: 49/56 Self-selected 73mgait speed: 20.6s = 0.49 m/s;   6" forward and L lateral step-ups with BUE support x 10 each; Gait in rehab gym as well as outside across grass, concrete, curbs, and decline with added dual cognitive tasks. Pt had 3 episodes of falls requiring mod to maxA+1 to  prevent her from falling.    Pt educated throughout session about proper posture and technique with exercises. Improved exercise technique, movement at target joints, use of target muscles after min to mod verbal, visual, tactile cues.      Patient demonstrates excellent motivation during session today. Updated outcome measures/goals with patient today. Her BERG remains consistent at 49/56. FOTO improved slightly to 59 today. Self-selected 125mait speed is unchanged today at 0.49 m/s. Additional time during session today focused on balance and gait. Patient continues to experience multiple losses of balance when practicing gait without any assistive device. She complains of shifting within her RLE socket as well as prosthesis rotating externally when walking. Pt encouraged to follow-up with prosthetist. Patient encouraged to continue HEP and follow-up as scheduled.  She will benefit from PT services to address deficits in strength, balance, and mobility in order to return to full function at home.  PT Short Term Goals - 06/17/21 1103       PT SHORT TERM GOAL #1   Title Pt will be independent with HEP in order to improve strength and balance in order to decrease fall risk and improve function at home.    Time 6    Period Weeks    Status On-going    Target Date 06/01/21               PT Long Term Goals - 06/17/21 1103       PT LONG TERM GOAL #1   Title Pt will improve BERG to at least 52/56 in order to demonstrate clinically significant improvement in balance and decrease risk for falls;    Baseline 01/26/21: 45/56; 03/02/21: 47/56; 04/20/21: 49/56; 06/17/21: 49/56    Time 12    Period Weeks    Status Partially Met    Target Date 07/13/21      PT LONG TERM GOAL #2   Title Pt will improve ABC by at least 13% in order to demonstrate clinically significant improvement in balance confidence.    Baseline 01/26/21: 57.5%; 03/02/21: 89.4%     Time 8    Period Weeks    Status Achieved      PT LONG TERM GOAL #3   Title Pt will improve FOTO to at least 63 in order to demonstrate clinically significant improvement in function related to her RLE amputation    Baseline 01/26/21: 59; 03/02/21: 71; 04/06/21: 57; 06/17/21: 59    Time 8    Period Weeks    Status Partially Met    Target Date 07/13/21      PT LONG TERM GOAL #4   Title Pt will increase self-selected 10MWT to at least 0.6 m/s in order to demonstrate clinically significant improvement in community ambulation.    Baseline 01/26/21: 31.5s = 0.32 m/s; 03/02/21: 25.5s = 0.39 m/s; 04/06/21: 20.7s = 0.48 m/s; 06/17/21: 20.6s = 0.49 m/s    Time 8    Period Weeks    Status Partially Met    Target Date 07/13/21                   Plan - 06/17/21 1102     Clinical Impression Statement Patient demonstrates excellent motivation during session today. Updated outcome measures/goals with patient today. Her BERG remains consistent at 49/56. FOTO improved slightly to 59 today. Self-selected 49mgait speed is unchanged today at 0.49 m/s. Additional time during session today focused on balance and gait. Patient continues to experience multiple losses of balance when practicing gait without any assistive device. She complains of shifting within her RLE socket as well as prosthesis rotating externally when walking. Pt encouraged to follow-up with prosthetist. Patient encouraged to continue HEP and follow-up as scheduled.  She will benefit from PT services to address deficits in strength, balance, and mobility in order to return to full function at home.    Personal Factors and Comorbidities Age;Comorbidity 3+    Comorbidities Depression, osteoporosis, vascular disease    Examination-Activity Limitations Bathing;Locomotion Level;Squat;Stairs;Stand;Transfers    Examination-Participation Restrictions Cleaning;Community Activity;Meal Prep;Yard Work    Stability/Clinical Decision Making  Unstable/Unpredictable    Rehab Potential Good    PT Frequency 2x / week    PT Duration 12 weeks    PT Treatment/Interventions ADLs/Self Care Home Management;Aquatic Therapy;Canalith Repostioning;Cryotherapy;Electrical Stimulation;Iontophoresis 426mml Dexamethasone;Moist Heat;Traction;Ultrasound;Gait training;Stair training;Therapeutic activities;Therapeutic exercise;Neuromuscular re-education;Manual techniques;Dry needling;Vestibular;Spinal Manipulations;Joint Manipulations    PT Next Visit Plan Strengthening, practice gait  training with lofstrand crutches    PT Home Exercise Plan Access Code: [email or web address]    Consulted and Agree with Plan of Care Patient                 Patient will benefit from skilled therapeutic intervention in order to improve the following deficits and impairments:  Abnormal gait, Decreased strength, Difficulty walking  Visit Diagnosis: Muscle weakness (generalized)  Difficulty in walking, not elsewhere classified  Unsteadiness on feet     Problem List Patient Active Problem List   Diagnosis Date Noted   Muscle spasm 09/09/2020   S/P AKA (above knee amputation), right ([place]) 09/08/2020   Ischemia of extremity 07/02/2020   Chronic pain in right shoulder 11/25/2019   Encounter for long-term (current) use of high-risk medication 07/16/2019   GCA (giant cell arteritis) ([place]) 07/16/2019   Temporal arteritis ([place]) 07/13/2019   Postoperative wound infection 06/23/2019   Ischemic leg 05/31/2019   Atherosclerosis of native arteries of extremity with intermittent claudication ([place]) 05/30/2019   Depression 05/29/2019   Eczema of lower extremity 03/04/2018   Chronic venous insufficiency 03/02/2018   Personal history of colonic polyps    Family history of colonic polyps    Benign neoplasm of ascending colon    Mitral valve insufficiency 02/08/2017   Dyspnea on exertion 02/07/2017   Non-rheumatic mitral regurgitation 02/07/2017   Precordial pain 02/07/2017    CAD (coronary artery disease) 12/21/2016   Essential hypertension 12/21/2016   Compression fracture of lumbar spine, non-traumatic ([place]) 04/26/2016   Compression fracture of L3 lumbar vertebra 04/20/2016   PVD (peripheral vascular disease) ([place]) 02/24/2016   Family history of premature CAD 02/24/2016   Gastritis    Other specified diseases of esophagus    Hiatal hernia    Gastritis and gastroduodenitis    Ischemia of lower extremity    Arterial occlusion ([facility])    Atherosclerosis of aorta ([place])    History of smoking    Hyperlipidemia    Pain in the chest    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 02/05/2016   Carotid artery stenosis 01/04/2016   Vertigo, benign paroxysmal 12/28/2015   Clinical depression 09/06/2015   History of alcoholism ([place]) 09/06/2015   Angiopathy, 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[place]) 09/06/2015   Atherosclerosis of native arteries of extremity with rest pain ([place]) 09/06/2015   Acute non-recurrent maxillary sinusitis 07/14/2015   Vaginal pruritus 05/26/2015   Chronic recurrent major depressive disorder ([place]) 03/09/2015   Osteoporosis, post-menopausal 03/09/2015   Peripheral blood vessel disorder ([place]) 03/09/2015   Acid reflux 03/09/2015   GERD (gastroesophageal reflux disease) 12/21/2014   Carotid artery narrowing 01/28/2014   Peripheral arterial occlusive disease ([place]) 06/08/2011   Occlusion and stenosis of unspecified carotid artery 06/08/2011   PAD (peripheral artery disease) ([place]) 06/08/2011   Lyndel Safe Laityn Bensen PT, DPT, GCS  [place] 06/17/2021, 11:55 AM  [facility] [address] [place], [place], [address] Phone: [phone]   Fax:  [phone]  Name: Sara [name] MRN: [id number] Date of Birth: 1950/11/28

## 2021-06-22 ENCOUNTER — Ambulatory Visit: Payer: Medicare Other | Attending: Vascular Surgery

## 2021-06-22 ENCOUNTER — Other Ambulatory Visit: Payer: Self-pay

## 2021-06-22 DIAGNOSIS — R262 Difficulty in walking, not elsewhere classified: Secondary | ICD-10-CM | POA: Diagnosis present

## 2021-06-22 DIAGNOSIS — M6281 Muscle weakness (generalized): Secondary | ICD-10-CM | POA: Diagnosis not present

## 2021-06-22 DIAGNOSIS — M79604 Pain in right leg: Secondary | ICD-10-CM

## 2021-06-22 DIAGNOSIS — R2681 Unsteadiness on feet: Secondary | ICD-10-CM

## 2021-06-22 NOTE — Therapy (Signed)
[facility] [address] [place], [place], [address] Phone: [phone]   Fax:  [phone]  Physical Therapy Treatment  Patient Details  Name: Sara [name] MRN: [id number] Date of Birth: 07/29/1951 Referring Provider (PT): Dr. Delana Meyer   Encounter Date: 06/22/2021   PT End of Session - 06/22/21 1055     Visit Number 43    Number of Visits 9    Date for PT Re-Evaluation 07/13/21    Authorization Type eval: 01/26/21    PT Start Time 1015    PT Stop Time 1100    PT Time Calculation (min) 45 min    Equipment Utilized During Treatment Gait belt    Activity Tolerance Patient tolerated treatment well    Behavior During Therapy [facility] for tasks assessed/performed                 Past Medical History:  Diagnosis Date   Arthritis    right shoulder   Depression    Dyspnea    GERD (gastroesophageal reflux disease)    History of blood clots    Hyperlipidemia    Hypertension    Mild mitral regurgitation    Osteoporosis    Peripheral vascular disease ([place])    Stomach ulcer    Vascular disease    Sees Dr. Delana Meyer   Vertigo    Last episode approx Aug 2015   Wears dentures    full upper    Past Surgical History:  Procedure Laterality Date   ADENOIDECTOMY     AMPUTATION Right 08/11/2020   Procedure: AMPUTATION ABOVE KNEE;  Surgeon: [name], MD;  Location: [facility];  Service: Vascular;  Laterality: Right;   ARTERY BIOPSY Right 07/14/2019   Procedure: BIOPSY TEMPORAL ARTERY;  Surgeon: [name], MD;  Location: [facility];  Service: Vascular;  Laterality: Right;   BREAST CYST ASPIRATION Left    CARDIAC CATHETERIZATION  02/15/2017   [facility]   COLONOSCOPY WITH PROPOFOL N/A 10/22/2017   Procedure: COLONOSCOPY WITH PROPOFOL;  Surgeon: Lucilla Lame, MD;  Location: [place];  Service: Endoscopy;  Laterality: N/A;  specimens not taken--pt on Plavix will be brought back in after 7 days off med   COLONOSCOPY  WITH PROPOFOL N/A 11/05/2017   Procedure: COLONOSCOPY WITH PROPOFOL;  Surgeon: Lucilla Lame, MD;  Location: [place];  Service: Endoscopy;  Laterality: N/A;   ESOPHAGOGASTRODUODENOSCOPY N/A 12/21/2014   Procedure: ESOPHAGOGASTRODUODENOSCOPY (EGD);  Surgeon: Lucilla Lame, MD;  Location: [place];  Service: Gastroenterology;  Laterality: N/A;   ESOPHAGOGASTRODUODENOSCOPY (EGD) WITH PROPOFOL N/A 02/08/2016   Procedure: ESOPHAGOGASTRODUODENOSCOPY (EGD) WITH PROPOFOL;  Surgeon: Lucilla Lame, MD;  Location: [facility];  Service: Endoscopy;  Laterality: N/A;   FASCIOTOMY Right 05/30/2019   Procedure: FASCIOTOMY;  Surgeon: [name], MD;  Location: [facility];  Service: Vascular;  Laterality: Right;   FASCIOTOMY CLOSURE Right 06/04/2019   Procedure: FASCIOTOMY CLOSURE;  Surgeon: [name], MD;  Location: [facility];  Service: Vascular;  Laterality: Right;   HEMORROIDECTOMY  2014   LOWER EXTREMITY ANGIOGRAPHY Right 05/30/2019   Procedure: LOWER EXTREMITY ANGIOGRAPHY;  Surgeon: [name], MD;  Location: [place] [facility];  Service: Cardiovascular;  Laterality: Right;   LOWER EXTREMITY ANGIOGRAPHY Right 07/02/2020   Procedure: LOWER EXTREMITY ANGIOGRAPHY;  Surgeon: [name], MD;  Location: [place] [facility];  Service: Cardiovascular;  Laterality: Right;   PERIPHERAL VASCULAR CATHETERIZATION N/A 02/09/2016   Procedure: Abdominal Aortogram w/Lower  Extremity;  Surgeon: [name], MD;  Location: [place] [facility];  Service: Cardiovascular;  Laterality: N/A;   PERIPHERAL VASCULAR CATHETERIZATION Right 02/10/2016   Procedure: Lower Extremity Angiography;  Surgeon: [name], MD;  Location: [place] [facility];  Service: Cardiovascular;  Laterality: Right;   POLYPECTOMY  11/05/2017   Procedure: POLYPECTOMY;  Surgeon: Lucilla Lame, MD;  Location: [place];  Service: Endoscopy;;   SHOULDER ARTHROSCOPY WITH ROTATOR CUFF REPAIR AND  SUBACROMIAL DECOMPRESSION Right 02/27/2020   Procedure: RIGHT SHOULDER ARTHROSCOPY SUBACROMIAL DECOMPRESSION, DISTAL CLAVICLE EXCISION AND MINI-OPEN ROTATOR CUFF REPAIR;  Surgeon: Thornton Park, MD;  Location: [facility];  Service: Orthopedics;  Laterality: Right;   TONSILLECTOMY     VASCULAR SURGERY  [id number],[id number]   Fem-Pop Bypass    There were no vitals filed for this visit.     Subjective Assessment - 06/22/21 1054     Subjective Pt reports that her R shoulder pain continues to be bad. She continues to have distal RLE pain as well as phantom limb pain.  She denies any resting pain upon arrival.  No recent falls reported.  No specific questions upon arrival.    Pertinent History Pt has a history of RLE vascular issues since 1991. 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 She received her RLE prosthetic in April 2022 and has been receiving [facility] since her surgery. They discharged her yesterday to continue with OP PT. She continues with phantom RLE pain, especially at night and takes gabapentin with minimal relief. She has also been having R shoulder pain since the surgery which she attributes to using a wheelchair. She underwent R RTC surgery in September of 2021 and had physical therapy afterwards. She has been having issues with bilateral carpal tunnel and has a NCV study scheduled for tomorrow.                TREATMENT   Ther-ex  NuStep L0-1 x 5 minutes BLE only for warm-up during interval history;  With prosthetic doffed: Supine R SLR hip flexion with manual resistance from therapist 2 x 20; Supine RLE bridge with residual RLE resting on stool (towel on top for cushion), arms at side, 3s hold, 2 x 20; Supine RLE long axis manually resisted external and internal rotation 2 x 20 each; Supine RLE adduction with manual resistance 2 x 20; L sidelying R hip abduction with manual resistance 2 x 20; L sidelying R hip extension from 90 hip flexion to  available end range extension with manual resistance 2 x 20;   Neuromuscular Re-education  Mirror therapy performed with patient sitting at edge of mat table and mirror between legs to simulate right lower extremity.  While watching the mirror with LLE patient performed seated marches x 1 minute with 4# ankle weight, long arc quads with 3# ankle weight x 1 minute, L ankle AROM plantar flexion/dorsiflexion x 1 minute, L ankle ball circles CW/CCW x 1 minute;   Pt educated throughout session about proper posture and technique with exercises. Improved exercise technique, movement at target joints, use of target muscles after min to mod verbal, visual, tactile cues.      Patient demonstrates excellent motivation during session today. Session today focused on RLE strengthening. Continued with mirror therapy to help with phantom limb pain. Patient encouraged to continue HEP and follow-up as scheduled. Will continue to progress dynamic balance, R single leg stability, and BLE strength. She will benefit from PT  services to address deficits in strength, balance, and mobility in order to return to full function at home.                            PT Short Term Goals - 06/17/21 1103       PT SHORT TERM GOAL #1   Title Pt will be independent with HEP in order to improve strength and balance in order to decrease fall risk and improve function at home.    Time 6    Period Weeks    Status On-going    Target Date 06/01/21               PT Long Term Goals - 06/17/21 1103       PT LONG TERM GOAL #1   Title Pt will improve BERG to at least 52/56 in order to demonstrate clinically significant improvement in balance and decrease risk for falls;    Baseline 01/26/21: 45/56; 03/02/21: 47/56; 04/20/21: 49/56; 06/17/21: 49/56    Time 12    Period Weeks    Status Partially Met    Target Date 07/13/21      PT LONG TERM GOAL #2   Title Pt will improve ABC by at least 13% in order to  demonstrate clinically significant improvement in balance confidence.    Baseline 01/26/21: 57.5%; 03/02/21: 89.4%    Time 8    Period Weeks    Status Achieved      PT LONG TERM GOAL #3   Title Pt will improve FOTO to at least 63 in order to demonstrate clinically significant improvement in function related to her RLE amputation    Baseline 01/26/21: 59; 03/02/21: 71; 04/06/21: 57; 06/17/21: 59    Time 8    Period Weeks    Status Partially Met    Target Date 07/13/21      PT LONG TERM GOAL #4   Title Pt will increase self-selected 10MWT to at least 0.6 m/s in order to demonstrate clinically significant improvement in community ambulation.    Baseline 01/26/21: 31.5s = 0.32 m/s; 03/02/21: 25.5s = 0.39 m/s; 04/06/21: 20.7s = 0.48 m/s; 06/17/21: 20.6s = 0.49 m/s    Time 8    Period Weeks    Status Partially Met    Target Date 07/13/21                   Plan - 06/22/21 1056     Clinical Impression Statement Patient demonstrates excellent motivation during session today. Session today focused on RLE strengthening. Continued with mirror therapy to help with phantom limb pain. Patient encouraged to continue HEP and follow-up as scheduled. Will continue to progress dynamic balance, R single leg stability, and BLE strength. She will benefit from PT services to address deficits in strength, balance, and mobility in order to return to full function at home.    Personal Factors and Comorbidities Age;Comorbidity 3+    Comorbidities Depression, osteoporosis, vascular disease    Examination-Activity Limitations Bathing;Locomotion Level;Squat;Stairs;Stand;Transfers    Examination-Participation Restrictions Cleaning;Community Activity;Meal Prep;Yard Work    Stability/Clinical Decision Making Unstable/Unpredictable    Rehab Potential Good    PT Frequency 2x / week    PT Duration 12 weeks    PT Treatment/Interventions ADLs/Self Care Home Management;Aquatic Therapy;Canalith  Repostioning;Cryotherapy;Electrical Stimulation;Iontophoresis 65m/ml Dexamethasone;Moist Heat;Traction;Ultrasound;Gait training;Stair training;Therapeutic activities;Therapeutic exercise;Neuromuscular re-education;Manual techniques;Dry needling;Vestibular;Spinal Manipulations;Joint Manipulations    PT Next Visit Plan Strengthening, practice gait training with  l[place]strand crutches    PT Home Exercise Plan Access Code: [email or web address]    Consulted and Agree with Plan of Care Patient                 Patient will benefit from skilled therapeutic intervention in order to improve the following deficits and impairments:  Abnormal gait, Decreased strength, Difficulty walking  Visit Diagnosis: Muscle weakness (generalized)  Difficulty in walking, not elsewhere classified  Unsteadiness on feet  Pain in right leg     Problem List Patient Active Problem List   Diagnosis Date Noted   Muscle spasm 09/09/2020   S/P AKA (above knee amputation), right ([place]) 09/08/2020   Ischemia of extremity 07/02/2020   Chronic pain in right shoulder 11/25/2019   Encounter for long-term (current) use of high-risk medication 07/16/2019   GCA (giant cell arteritis) ([place]) 07/16/2019   Temporal arteritis ([place]) 07/13/2019   Postoperative wound infection 06/23/2019   Ischemic leg 05/31/2019   Atherosclerosis of native arteries of extremity with intermittent claudication ([place]) 05/30/2019   Depression 05/29/2019   Eczema of lower extremity 03/04/2018   Chronic venous insufficiency 03/02/2018   Personal history of colonic polyps    Family history of colonic polyps    Benign neoplasm of ascending colon    Mitral valve insufficiency 02/08/2017   Dyspnea on exertion 02/07/2017   Non-rheumatic mitral regurgitation 02/07/2017   Precordial pain 02/07/2017   CAD (coronary artery disease) 12/21/2016   Essential hypertension 12/21/2016   Compression fracture of lumbar spine, non-traumatic ([place]) 04/26/2016   Compression  fracture of L3 lumbar vertebra 04/20/2016   PVD (peripheral vascular disease) ([place]) 02/24/2016   Family history of premature CAD 02/24/2016   Gastritis    Other specified diseases of esophagus    Hiatal hernia    Gastritis and gastroduodenitis    Ischemia of lower extremity    Arterial occlusion (H[facility])    Atherosclerosis of aorta ([place])    History of smoking    Hyperlipidemia    Pain in the chest    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 02/05/2016   Carotid artery stenosis 01/04/2016   Vertigo, benign paroxysmal 12/28/2015   Clinical depression 09/06/2015   History of alcoholism ([place]) 09/06/2015   Angiopathy, 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[place]) 09/06/2015   Atherosclerosis of native arteries of extremity with rest pain ([place]) 09/06/2015   Acute non-recurrent maxillary sinusitis 07/14/2015   Vaginal pruritus 05/26/2015   Chronic recurrent major depressive disorder ([place]) 03/09/2015   Osteoporosis, post-menopausal 03/09/2015   Peripheral blood vessel disorder ([place]) 03/09/2015   Acid reflux 03/09/2015   GERD (gastroesophageal reflux disease) 12/21/2014   Carotid artery narrowing 01/28/2014   Peripheral arterial occlusive disease ([place]) 06/08/2011   Occlusion and stenosis of unspecified carotid artery 06/08/2011   PAD (peripheral artery disease) ([place]) 06/08/2011   Lyndel Safe Markas [place] PT, DPT, GCS  Laurita Peron 06/22/2021, 2:28 PM  [facility] [address] [place], [place], [address] Phone: [phone]   Fax:  [phone]  Name: Sara [name] MRN: [id number] Date of Birth: 1951/06/05

## 2021-06-24 ENCOUNTER — Emergency Department: Payer: Medicare Other

## 2021-06-24 ENCOUNTER — Other Ambulatory Visit: Payer: Self-pay

## 2021-06-24 ENCOUNTER — Ambulatory Visit: Payer: Medicare Other

## 2021-06-24 ENCOUNTER — Emergency Department
Admission: EM | Admit: 2021-06-24 | Discharge: 2021-06-24 | Disposition: A | Discharge status: Home / Self Care | Payer: Medicare Other | Attending: Emergency Medicine | Admitting: Emergency Medicine

## 2021-06-24 ENCOUNTER — Ambulatory Visit (INDEPENDENT_AMBULATORY_CARE_PROVIDER_SITE_OTHER)
Admission: EM | Admit: 2021-06-24 | Discharge: 2021-06-24 | Disposition: A | Discharge status: Home / Self Care | Payer: Medicare Other | Source: Home / Self Care

## 2021-06-24 ENCOUNTER — Encounter: Payer: Self-pay | Admitting: Emergency Medicine

## 2021-06-24 VITALS — BP 133/67 | HR 49

## 2021-06-24 DIAGNOSIS — Z7901 Long term (current) use of anticoagulants: Secondary | ICD-10-CM | POA: Insufficient documentation

## 2021-06-24 DIAGNOSIS — R2681 Unsteadiness on feet: Secondary | ICD-10-CM

## 2021-06-24 DIAGNOSIS — R42 Dizziness and giddiness: Secondary | ICD-10-CM | POA: Insufficient documentation

## 2021-06-24 DIAGNOSIS — Z87891 Personal history of nicotine dependence: Secondary | ICD-10-CM | POA: Diagnosis not present

## 2021-06-24 DIAGNOSIS — I2510 Atherosclerotic heart disease of native coronary artery without angina pectoris: Secondary | ICD-10-CM | POA: Insufficient documentation

## 2021-06-24 DIAGNOSIS — M79604 Pain in right leg: Secondary | ICD-10-CM

## 2021-06-24 DIAGNOSIS — R262 Difficulty in walking, not elsewhere classified: Secondary | ICD-10-CM

## 2021-06-24 DIAGNOSIS — I10 Essential (primary) hypertension: Secondary | ICD-10-CM | POA: Insufficient documentation

## 2021-06-24 DIAGNOSIS — I499 Cardiac arrhythmia, unspecified: Secondary | ICD-10-CM | POA: Insufficient documentation

## 2021-06-24 DIAGNOSIS — Z79899 Other long term (current) drug therapy: Secondary | ICD-10-CM | POA: Diagnosis not present

## 2021-06-24 DIAGNOSIS — M6281 Muscle weakness (generalized): Secondary | ICD-10-CM

## 2021-06-24 LAB — COMPREHENSIVE METABOLIC PANEL
ALT: 23 U/L (ref 0–44)
AST: 26 U/L (ref 15–41)
Albumin: 3.9 g/dL (ref 3.5–5.0)
Alkaline Phosphatase: 54 U/L (ref 38–126)
Anion gap: 8 (ref 5–15)
BUN: 15 mg/dL (ref 8–23)
CO2: 26 mmol/L (ref 22–32)
Calcium: 8.7 mg/dL — ABNORMAL LOW (ref 8.9–10.3)
Chloride: 102 mmol/L (ref 98–111)
Creatinine, Ser: 0.8 mg/dL (ref 0.44–1.00)
GFR, Estimated: >60 mL/min (ref >60)
Glucose, Bld: 105 mg/dL — ABNORMAL HIGH (ref 70–99)
Potassium: 3.8 mmol/L (ref 3.5–5.1)
Sodium: 136 mmol/L (ref 135–145)
Total Bilirubin: 0.4 mg/dL (ref 0.3–1.2)
Total Protein: 6.5 g/dL (ref 6.5–8.1)

## 2021-06-24 LAB — URINALYSIS, ROUTINE W REFLEX MICROSCOPIC
Bilirubin Urine: NEGATIVE
Glucose, UA: NEGATIVE mg/dL
Hgb urine dipstick: NEGATIVE
Ketones, ur: NEGATIVE mg/dL
Leukocytes,Ua: NEGATIVE
Nitrite: NEGATIVE
Protein, ur: NEGATIVE mg/dL
Specific Gravity, Urine: 1.008 (ref 1.005–1.030)
pH: 8 (ref 5.0–8.0)

## 2021-06-24 LAB — CBC WITH DIFFERENTIAL/PLATELET
Abs Immature Granulocytes: 0.02 10*3/uL (ref 0.00–0.07)
Basophils Absolute: 0.1 10*3/uL (ref 0.0–0.1)
Basophils Relative: 2 %
Eosinophils Absolute: 0.2 10*3/uL (ref 0.0–0.5)
Eosinophils Relative: 5 %
HCT: 42.1 % (ref 36.0–46.0)
Hemoglobin: 13.7 g/dL (ref 12.0–15.0)
Immature Granulocytes: 0 %
Lymphocytes Relative: 17 %
Lymphs Abs: 0.9 10*3/uL (ref 0.7–4.0)
MCH: 31.7 pg (ref 26.0–34.0)
MCHC: 32.5 g/dL (ref 30.0–36.0)
MCV: 97.5 fL (ref 80.0–100.0)
Monocytes Absolute: 0.9 10*3/uL (ref 0.1–1.0)
Monocytes Relative: 17 %
Neutro Abs: 3.1 10*3/uL (ref 1.7–7.7)
Neutrophils Relative %: 59 %
Platelets: 163 10*3/uL (ref 150–400)
RBC: 4.32 MIL/uL (ref 3.87–5.11)
RDW: 13.3 % (ref 11.5–15.5)
WBC: 5.3 10*3/uL (ref 4.0–10.5)
nRBC: 0 % (ref 0.0–0.2)

## 2021-06-24 LAB — TROPONIN I (HIGH SENSITIVITY)
Troponin I (High Sensitivity): 3 ng/L (ref <18)
Troponin I (High Sensitivity): 3 ng/L (ref <18)

## 2021-06-24 LAB — TSH: TSH: 1.341 u[IU]/mL (ref 0.350–4.500)

## 2021-06-24 NOTE — ED Notes (Signed)
Pt back from MRI 

## 2021-06-24 NOTE — ED Provider Notes (Signed)
[facility] Emergency Department Provider Note   ____________________________________________   Event Date/Time   First MD Initiated Contact with Patient 06/24/21 1722     (approximate)  I have reviewed the triage vital signs and the nursing notes.   HISTORY  Chief Complaint Dizziness    HPI Sara [name] is a 70 y.o. female who below listed past medical history.  She went to physical therapy today and reported that she was dizzy not lightheaded not vertiginous or spinning just dizziness and her balance was off.  She had difficulty walking.  She is not usually like that.  This happened when she stood up.  She was taken from the physical therapy area to the urgent care who saw extra beats on her EKG and sent her to the emergency department.  Patient denies any lightheadedness or vertigo or shortness of breath or any other complaints just this feeling of unsteadiness when she tries to walk.  It is currently gone and I was going to let her go but then when we tried to walk her she developed the unsteadiness again.  Patient reports she only has 1 cup of coffee a day although the urgent care notes that she had 2 today but I this time that should have worn off.        Past Medical History:  Diagnosis Date   Arthritis    right shoulder   Depression    Dyspnea    GERD (gastroesophageal reflux disease)    History of blood clots    Hyperlipidemia    Hypertension    Mild mitral regurgitation    Osteoporosis    Peripheral vascular disease (HCC)    Stomach ulcer    Vascular disease    Sees Dr. 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   Ischemia of extremity 07/02/2020   Chronic pain in right shoulder 11/25/2019   Encounter for long-term (current) use of high-risk medication  07/16/2019   GCA (giant cell arteritis) ([place]) 07/16/2019   Temporal arteritis ([place]) 07/13/2019   Postoperative wound infection 06/23/2019   Ischemic leg 05/31/2019   Atherosclerosis of native arteries of extremity with intermittent claudication ([place]) 05/30/2019   Depression 05/29/2019   Eczema of lower extremity 03/04/2018   Chronic venous insufficiency 03/02/2018   Personal history of colonic polyps    Family history of colonic polyps    Benign neoplasm of ascending colon    Mitral valve insufficiency 02/08/2017   Dyspnea on exertion 02/07/2017   Non-rheumatic mitral regurgitation 02/07/2017   Precordial pain 02/07/2017   CAD (coronary artery disease) 12/21/2016   Essential hypertension 12/21/2016   Compression fracture of lumbar spine, non-traumatic ([place]) 04/26/2016   Compression fracture of L3 lumbar vertebra 04/20/2016   PVD (peripheral vascular disease) ([place]) 02/24/2016   Family history of premature CAD 02/24/2016   Gastritis    Other specified diseases of esophagus    Hiatal hernia    Gastritis and gastroduodenitis    Ischemia of lower extremity    Arterial occlusion (HCC)    Atherosclerosis of aorta (HCC)    History of smoking    Hyperlipidemia    Pain in the chest    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 02/05/2016  Carotid artery stenosis 01/04/2016   Vertigo, benign paroxysmal 12/28/2015   Clinical depression 09/06/2015   History of alcoholism ([place]) 09/06/2015   Angiopathy, 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[place]) 09/06/2015   Atherosclerosis of native arteries of extremity with rest pain ([place]) 09/06/2015   Acute non-recurrent maxillary sinusitis 07/14/2015   Vaginal pruritus 05/26/2015   Chronic recurrent major depressive disorder ([place]) 03/09/2015   Osteoporosis, post-menopausal 03/09/2015   Peripheral blood vessel disorder ([place]) 03/09/2015   Acid reflux 03/09/2015   GERD (gastroesophageal reflux disease) 12/21/2014   Carotid artery narrowing 01/28/2014    Peripheral arterial occlusive disease ([place]) 06/08/2011   Occlusion and stenosis of unspecified carotid artery 06/08/2011   PAD (peripheral artery disease) ([place]) 06/08/2011    Past Surgical History:  Procedure Laterality Date   ADENOIDECTOMY     AMPUTATION Right 08/11/2020   Procedure: AMPUTATION ABOVE KNEE;  Surgeon: [name], MD;  Location: [facility];  Service: Vascular;  Laterality: Right;   ARTERY BIOPSY Right 07/14/2019   Procedure: BIOPSY TEMPORAL ARTERY;  Surgeon: [name], MD;  Location: [facility];  Service: Vascular;  Laterality: Right;   BREAST CYST ASPIRATION Left    CARDIAC CATHETERIZATION  02/15/2017   [facility]   COLONOSCOPY WITH PROPOFOL N/A 10/22/2017   Procedure: COLONOSCOPY WITH PROPOFOL;  Surgeon: Lucilla Lame, MD;  Location: [place];  Service: Endoscopy;  Laterality: N/A;  specimens not taken--pt on Plavix will be brought back in after 7 days off med   COLONOSCOPY WITH PROPOFOL N/A 11/05/2017   Procedure: COLONOSCOPY WITH PROPOFOL;  Surgeon: Lucilla Lame, MD;  Location: [place];  Service: Endoscopy;  Laterality: N/A;   ESOPHAGOGASTRODUODENOSCOPY N/A 12/21/2014   Procedure: ESOPHAGOGASTRODUODENOSCOPY (EGD);  Surgeon: Lucilla Lame, MD;  Location: [place];  Service: Gastroenterology;  Laterality: N/A;   ESOPHAGOGASTRODUODENOSCOPY (EGD) WITH PROPOFOL N/A 02/08/2016   Procedure: ESOPHAGOGASTRODUODENOSCOPY (EGD) WITH PROPOFOL;  Surgeon: Lucilla Lame, MD;  Location: [facility];  Service: Endoscopy;  Laterality: N/A;   FASCIOTOMY Right 05/30/2019   Procedure: FASCIOTOMY;  Surgeon: [name], MD;  Location: [facility];  Service: Vascular;  Laterality: Right;   FASCIOTOMY CLOSURE Right 06/04/2019   Procedure: FASCIOTOMY CLOSURE;  Surgeon: [name], MD;  Location: [facility];  Service: Vascular;  Laterality: Right;   HEMORROIDECTOMY  2014   LOWER EXTREMITY ANGIOGRAPHY Right 05/30/2019   Procedure: LOWER EXTREMITY  ANGIOGRAPHY;  Surgeon: [name], MD;  Location: [place] [facility];  Service: Cardiovascular;  Laterality: Right;   LOWER EXTREMITY ANGIOGRAPHY Right 07/02/2020   Procedure: LOWER EXTREMITY ANGIOGRAPHY;  Surgeon: [name], MD;  Location: [place] [facility];  Service: Cardiovascular;  Laterality: Right;   PERIPHERAL VASCULAR CATHETERIZATION N/A 02/09/2016   Procedure: Abdominal Aortogram w/Lower Extremity;  Surgeon: [name], MD;  Location: [place] [facility];  Service: Cardiovascular;  Laterality: N/A;   PERIPHERAL VASCULAR CATHETERIZATION Right 02/10/2016   Procedure: Lower Extremity Angiography;  Surgeon: [name], MD;  Location: [place] [facility];  Service: Cardiovascular;  Laterality: Right;   POLYPECTOMY  11/05/2017   Procedure: POLYPECTOMY;  Surgeon: Lucilla Lame, MD;  Location: [place];  Service: Endoscopy;;   SHOULDER ARTHROSCOPY WITH ROTATOR CUFF REPAIR AND SUBACROMIAL DECOMPRESSION Right 02/27/2020   Procedure: RIGHT SHOULDER ARTHROSCOPY SUBACROMIAL DECOMPRESSION, DISTAL CLAVICLE EXCISION AND MINI-OPEN ROTATOR CUFF REPAIR;  Surgeon: Thornton Park, MD;  Location: [facility];  Service: Orthopedics;  Laterality: Right;   TONSILLECTOMY     VASCULAR SURGERY  [phone]   Fem-Pop Bypass  Prior to Admission medications   Medication Sig Start Date End Date Taking? Authorizing Provider  apixaban (ELIQUIS) 5 MG TABS tablet Take 1 tablet (5 mg total) by mouth 2 (two) times daily. 09/03/19   Kris Hartmann, NP  atorvastatin (LIPITOR) 40 MG tablet Take 40 mg by mouth at bedtime.  12/08/16   [provider]  calcium carbonate (OSCAL) 1500 (600 Ca) MG TABS tablet Take 1,200 mg of elemental calcium by mouth 2 (two) times daily with a meal.    [provider]  cholecalciferol (VITAMIN D3) 25 MCG (1000 UNIT) tablet Take 1,000 Units by mouth daily.    [provider]  clobetasol cream (TEMOVATE) 5.05 % Apply 1 application  topically daily as needed (itching). 04/07/20   [provider]  cyclobenzaprine (FLEXERIL) 10 MG tablet Take 1 tablet (10 mg total) by mouth 3 (three) times daily as needed for muscle spasms. 09/09/20   Schnier, Dolores Lory, MD  diltiazem (CARDIZEM CD) 240 MG 24 hr capsule Take 240 mg by mouth daily. 01/05/20   [provider]  DULoxetine (CYMBALTA) 60 MG capsule Take 60 mg by mouth every morning. 01/04/21   [provider]  gabapentin (NEURONTIN) 300 MG capsule TAKE 1 CAPSULE BY MOUTH THREE TIMES A DAY 04/20/21   Kris Hartmann, NP  ibandronate (BONIVA) 150 MG tablet Take 150 mg by mouth every 30 (thirty) days.  12/07/16   [provider]  losartan (COZAAR) 25 MG tablet Take 25 mg by mouth every morning.  12/01/16   [provider]  omeprazole (PRILOSEC) 20 MG capsule Take 20 mg by mouth daily.    [provider]  oxyCODONE-acetaminophen (PERCOCET/ROXICET) 5-325 MG tablet Take 1-2 tablets by mouth every 4 (four) hours as needed for moderate pain. 08/16/20   Algernon Huxley, MD  Tocilizumab (ACTEMRA ACTPEN) 162 MG/0.9ML SOAJ Inject 162 mg into the skin once a week.     [provider]  venlafaxine XR (EFFEXOR-XR) 150 MG 24 hr capsule Take 150 mg by mouth daily with breakfast.    [provider]  zolpidem (AMBIEN) 5 MG tablet Take 5 mg by mouth at bedtime as needed for sleep.  07/03/19   [provider]    Allergies Hydrocodone-acetaminophen, Amoxicillin, Metoprolol tartrate, Oxycodone, and Vicodin [hydrocodone-acetaminophen]  Family History  Problem Relation Age of Onset   CVA Mother    Heart attack Mother    Cancer Father        colon cancer   Colon cancer Father    Heart disease Maternal Uncle    Breast cancer Neg Hx     Social History Social History   Tobacco Use   Smoking status: Former    Packs/day: 2.00    Years: 25.00    Pack years: 50.00    Types: Cigarettes    Quit date: 08/22/1991    Years since  quitting: 29.8   Smokeless tobacco: Never  Vaping Use   Vaping Use: Never used  Substance Use Topics   Alcohol use: No    Alcohol/week: 0.0 standard drinks   Drug use: Never    Review of Systems  Constitutional: No fever/chills Eyes: No visual changes. ENT: No sore throat. Cardiovascular: Denies chest pain. Respiratory: Denies shortness of breath. Gastrointestinal: No abdominal pain.  No nausea, no vomiting.  No diarrhea.  No constipation. Genitourinary: Negative for dysuria. Musculoskeletal: Negative for back pain. Skin: Negative for rash. Neurological: Negative for headaches, focal weakness   ____________________________________________  PHYSICAL EXAM:  VITAL SIGNS: ED Triage Vitals  Enc Vitals Group     BP 06/24/21 1357 137/66     Pulse Rate 06/24/21 1357 92     Resp 06/24/21 1357 18     Temp 06/24/21 1357 98 F (36.7 C)     Temp src --      SpO2 06/24/21 1357 92 %     Weight --      Height --      Head Circumference --      Peak Flow --      Pain Score 06/24/21 1342 0     Pain Loc --      Pain Edu? --      Excl. in [place]? --     Constitutional: Alert and oriented. Well appearing and in no acute distress. Eyes: Conjunctivae are normal. PERRL. EOMI. Head: Atraumatic. Nose: No congestion/rhinnorhea. Mouth/Throat: Mucous membranes are moist.  Oropharynx non-erythematous. Neck: No stridor.  Cardiovascular: Normal rate, irregular rhythm. Grossly normal heart sounds.  Good peripheral circulation. Respiratory: Normal respiratory effort.  No retractions. Lungs CTAB. Gastrointestinal: Soft and nontender. No distention. No abdominal bruits.  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 Neurologic:  Normal speech and language. No gross focal neurologic deficits are appreciated.  Cranial nerves II through XII are intact although visual fields were not checked finger-to-nose and the 1 heel to the prosthesis are normal rapid  alternating movements are normal motor strength is 5/5 throughout patient denies any numbness.  Patient does have gait instability however.. Skin:  Skin is warm, dry and intact. No rash noted. Psychiatric: Mood and affect are normal. Speech and behavior are normal.  ____________________________________________   LABS (all labs ordered are listed, but only abnormal results are displayed)  Labs Reviewed  URINALYSIS, ROUTINE W REFLEX MICROSCOPIC - Abnormal; Notable for the following components:      Result Value   Color, Urine YELLOW (*)    APPearance CLEAR (*)    All other components within normal limits  TSH  TROPONIN I (HIGH SENSITIVITY)  TROPONIN I (HIGH SENSITIVITY)   ____________________________________________  EKG  EKG #1 read interpreted by me shows normal sinus rhythm rate of 95 with frequent PJCs almost every other beat.  Normal axis no acute ST-T wave changes EKG #2 shows normal sinus rhythm at 86 with PACs and PJCs normal axis again no acute ST-T changes EKG #3 normal sinus rhythm at 99 with PACs normal axis no acute ST-T wave changes all EKGs look very similar. ____________________________________________  RADIOLOGY Gertha Calkin, personally viewed and evaluated these images (plain radiographs) as part of my medical decision making, as well as reviewing the written report by the radiologist.  ED MD interpretation: MRI brain read by radiology reviewed by me is essentially not showing any acute problems Official radiology report(s): MR BRAIN WO CONTRAST  Result Date: 06/24/2021 CLINICAL DATA:  Dizziness upon standing EXAM: MRI HEAD WITHOUT CONTRAST TECHNIQUE: Multiplanar, multiecho pulse sequences of the brain and surrounding structures were obtained without intravenous contrast. COMPARISON:  None. FINDINGS: Brain: No restricted diffusion to suggest acute infarct. No acute hemorrhage, mass, mass effect, or midline shift. No foci of hemosiderin deposition to suggest  remote hemorrhage. T2 hyperintense signal in the periventricular white matter, likely the sequela of mild chronic small vessel ischemic disease. Vascular: Normal flow voids. Skull and upper cervical spine: Normal marrow signal. Sinuses/Orbits: Negative.  Status post bilateral lens replacements. Other: Trace fluid  in left mastoid air cells. IMPRESSION: No acute intracranial process. Electronically Signed   By: Merilyn Baba M.D.   On: 06/24/2021 19:58    ____________________________________________   PROCEDURES  Procedure(s) performed (including Critical Care):  Procedures   ____________________________________________   INITIAL IMPRESSION / ASSESSMENT AND PLAN / ED COURSE  Patient with no chest pain shortness of breath nausea vomiting fever chills coughing or any other problems.  Not sure why she is having the PACs/PJCs.  Troponins have been normal and stable.  A extra caffeine should have worn off by now.  I have sent a TSH just in case and we will get an MRI of the brain just to make sure there is no stroke causing her unsteady gait.   ----------------------------------------- 6:47 PM on 06/24/2021 ----------------------------------------- Patient is not orthostatic heart rate laying is 93 blood pressure lying is 131/79 sitting heart rate is 94 blood pressure is 130/79 standing heart rate is 100 blood pressure is 128/84 and after 3 minutes heart rate is 100 and blood pressure 146/98.   ----------------------------------------- 8:57 PM on 06/24/2021 ----------------------------------------- Patient was going to be discharged he got her up to walk and she was very unstable still.  I elected to get a urine which was negative and MRI of the brain since she was feeling out of balance without vertigo or any other symptoms and that was negative except for small amount of fluid in the left mastoid.  Patient does not have any pain anywhere she does not have any chest pain or shortness of breath or  any other symptoms.  All of her testing was negative including troponin.  I will let her go.  She is now walking without any difficulty.       ____________________________________________   FINAL CLINICAL IMPRESSION(S) / ED DIAGNOSES  Final diagnoses:  Dizziness     ED Discharge Orders     None        Note:  This document was prepared using Dragon voice recognition software and may include unintentional dictation errors.    Nena Polio, MD 06/24/21 [phone]

## 2021-06-24 NOTE — ED Provider Notes (Signed)
Emergency Medicine Provider Triage Evaluation Note  Sara [name] , a 70 y.o. female  was evaluated in triage.  Pt complains of dizziness. She went to urgent care and sent to the ER due to "an extra beat" on her EKG. She denies chest pain or shortness of breath.  Review of Systems  Positive: Dizziness Negative: Chest pain or shortness of breath  Physical Exam  There were no vitals taken for this visit. Gen:   Awake, no distress   Resp:  Normal effort  MSK:   Moves extremities without difficulty  Other:    Medical Decision Making  Medically screening exam initiated at 1:49 PM.  Appropriate orders placed.  PAHOLA DIMMITT was informed that the remainder of the evaluation will be completed by another provider, this initial triage assessment does not replace that evaluation, and the importance of remaining in the ED until their evaluation is complete.   Victorino Dike, FNP 06/24/21 1527    Arta Silence, MD 06/24/21 1948

## 2021-06-24 NOTE — ED Triage Notes (Signed)
Patient was brought down to [place] with Corene Cornea, physical therapist form [facility].  Patient states that when she stood up she felt dizzy and off balance.  She states that she has not had this happen before.  Patient reports some pressure in her head.  Patient denies chest pain or SOB.

## 2021-06-24 NOTE — Discharge Instructions (Addendum)
Please continue all your medicines including your Eliquis.  Please return for any worsening or return of symptoms and be sure to see Dr. Neoma Laming Monday as planned.

## 2021-06-24 NOTE — Therapy (Signed)
[facility] [address] [place], [place], [address] Phone: [phone]   Fax:  [phone]  Physical Therapy Treatment  Patient Details  Name: Sara [name] MRN: [id number] Date of Birth: Jan 16, 1951 Referring Provider (PT): Dr. Delana Meyer   Encounter Date: 06/24/2021   PT End of Session - 06/24/21 0952     Visit Number 43    Number of Visits 18    Date for PT Re-Evaluation 07/13/21    Authorization Type eval: 01/26/21    PT Start Time 1000    PT Stop Time 1015    PT Time Calculation (min) 15 min    Equipment Utilized During Treatment Gait belt    Activity Tolerance Patient tolerated treatment well    Behavior During Therapy [facility] for tasks assessed/performed                 Past Medical History:  Diagnosis Date   Arthritis    right shoulder   Depression    Dyspnea    GERD (gastroesophageal reflux disease)    History of blood clots    Hyperlipidemia    Hypertension    Mild mitral regurgitation    Osteoporosis    Peripheral vascular disease ([place])    Stomach ulcer    Vascular disease    Sees Dr. Delana Meyer   Vertigo    Last episode approx Aug 2015   Wears dentures    full upper    Past Surgical History:  Procedure Laterality Date   ADENOIDECTOMY     AMPUTATION Right 08/11/2020   Procedure: AMPUTATION ABOVE KNEE;  Surgeon: [name], MD;  Location: [facility];  Service: Vascular;  Laterality: Right;   ARTERY BIOPSY Right 07/14/2019   Procedure: BIOPSY TEMPORAL ARTERY;  Surgeon: [name], MD;  Location: [facility];  Service: Vascular;  Laterality: Right;   BREAST CYST ASPIRATION Left    CARDIAC CATHETERIZATION  02/15/2017   [facility]   COLONOSCOPY WITH PROPOFOL N/A 10/22/2017   Procedure: COLONOSCOPY WITH PROPOFOL;  Surgeon: Lucilla Lame, MD;  Location: [place];  Service: Endoscopy;  Laterality: N/A;  specimens not taken--pt on Plavix will be brought back in after 7 days off med   COLONOSCOPY  WITH PROPOFOL N/A 11/05/2017   Procedure: COLONOSCOPY WITH PROPOFOL;  Surgeon: Lucilla Lame, MD;  Location: [place];  Service: Endoscopy;  Laterality: N/A;   ESOPHAGOGASTRODUODENOSCOPY N/A 12/21/2014   Procedure: ESOPHAGOGASTRODUODENOSCOPY (EGD);  Surgeon: Lucilla Lame, MD;  Location: [place];  Service: Gastroenterology;  Laterality: N/A;   ESOPHAGOGASTRODUODENOSCOPY (EGD) WITH PROPOFOL N/A 02/08/2016   Procedure: ESOPHAGOGASTRODUODENOSCOPY (EGD) WITH PROPOFOL;  Surgeon: Lucilla Lame, MD;  Location: [facility];  Service: Endoscopy;  Laterality: N/A;   FASCIOTOMY Right 05/30/2019   Procedure: FASCIOTOMY;  Surgeon: [name], MD;  Location: [facility];  Service: Vascular;  Laterality: Right;   FASCIOTOMY CLOSURE Right 06/04/2019   Procedure: FASCIOTOMY CLOSURE;  Surgeon: [name], MD;  Location: [facility];  Service: Vascular;  Laterality: Right;   HEMORROIDECTOMY  2014   LOWER EXTREMITY ANGIOGRAPHY Right 05/30/2019   Procedure: LOWER EXTREMITY ANGIOGRAPHY;  Surgeon: [name], MD;  Location: [place] [facility];  Service: Cardiovascular;  Laterality: Right;   LOWER EXTREMITY ANGIOGRAPHY Right 07/02/2020   Procedure: LOWER EXTREMITY ANGIOGRAPHY;  Surgeon: [name], MD;  Location: [place] [facility];  Service: Cardiovascular;  Laterality: Right;   PERIPHERAL VASCULAR CATHETERIZATION N/A 02/09/2016   Procedure: Abdominal Aortogram w/Lower  Extremity;  Surgeon: [name], MD;  Location: [place] [facility];  Service: Cardiovascular;  Laterality: N/A;   PERIPHERAL VASCULAR CATHETERIZATION Right 02/10/2016   Procedure: Lower Extremity Angiography;  Surgeon: [name], MD;  Location: [place] [facility];  Service: Cardiovascular;  Laterality: Right;   POLYPECTOMY  11/05/2017   Procedure: POLYPECTOMY;  Surgeon: Lucilla Lame, MD;  Location: [place];  Service: Endoscopy;;   SHOULDER ARTHROSCOPY WITH ROTATOR CUFF REPAIR AND  SUBACROMIAL DECOMPRESSION Right 02/27/2020   Procedure: RIGHT SHOULDER ARTHROSCOPY SUBACROMIAL DECOMPRESSION, DISTAL CLAVICLE EXCISION AND MINI-OPEN ROTATOR CUFF REPAIR;  Surgeon: Thornton Park, MD;  Location: [facility];  Service: Orthopedics;  Laterality: Right;   TONSILLECTOMY     VASCULAR SURGERY  1991,2012   Fem-Pop Bypass    Vitals:   06/24/21 1005  BP: 133/67  Pulse: (!) 49       Subjective Assessment - 06/24/21 0952     Subjective Pt arrives today reporting that she has been dizzy since she woke up this morning. She feels off balance. No new medications this morning. She did not take her narcotic pain medication this morning. She took her sleeping medication at the typical time last night. No history of cardiac arrythmias. She denies any chest pain, palpitations, or heart racing. Denies any focal weakness or numbness/tingling.    Pertinent History Pt has a history of RLE vascular issues since 1991. 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 She received her RLE prosthetic in April 2022 and has been receiving [facility] since her surgery. They discharged her yesterday to continue with OP PT. She continues with phantom RLE pain, especially at night and takes gabapentin with minimal relief. She has also been having R shoulder pain since the surgery which she attributes to using a wheelchair. She underwent R RTC surgery in September of 2021 and had physical therapy afterwards. She has been having issues with bilateral carpal tunnel and has a NCV study scheduled for tomorrow.                TREATMENT  Repeat vitals after 5 minutes; BP: 126/56, HR: 48, 97; Manual count of HR by auscultation is 100 bpm. Heart rate feels irregular with one strong beat followed by a very thready beat. Upon ausculation two beats followed by a pause. This occurs consistently interspersed with some irregular beats throughout.  Pt arrives reporting dizziness and  instability. She remains dizzy during intake history sitting at rest. Vitals obtained and dynamometer reading bradycardia in the 40's bpm. Manual count of HR by auscultation is 100 bpm. Heart rate feels irregular with one strong beat followed by a very thready beat. Upon ausculation two beats followed by a pause. This occurs consistently interspersed with some irregular beats throughout. Pt denies any focal numbness/tingling or weakness. Facial strength and sensation intact and symmetrical. No dysarthria noted. No spontaneous nystagmus observed. Upon walking out of the clinic pt almost falls multiple times requiring assist from therapist. This is a gross decline from her normal balance. Therapist escorted patient to [facility] next door and handed off care the RN at clinic.                    PT Short Term Goals - 06/17/21 1103       PT SHORT TERM GOAL #1   Title Pt will be independent with HEP in order to improve strength and balance in order to  decrease fall risk and improve function at home.    Time 6    Period Weeks    Status On-going    Target Date 06/01/21               PT Long Term Goals - 06/17/21 1103       PT LONG TERM GOAL #1   Title Pt will improve BERG to at least 52/56 in order to demonstrate clinically significant improvement in balance and decrease risk for falls;    Baseline 01/26/21: 45/56; 03/02/21: 47/56; 04/20/21: 49/56; 06/17/21: 49/56    Time 12    Period Weeks    Status Partially Met    Target Date 07/13/21      PT LONG TERM GOAL #2   Title Pt will improve ABC by at least 13% in order to demonstrate clinically significant improvement in balance confidence.    Baseline 01/26/21: 57.5%; 03/02/21: 89.4%    Time 8    Period Weeks    Status Achieved      PT LONG TERM GOAL #3   Title Pt will improve FOTO to at least 63 in order to demonstrate clinically significant improvement in function related to her RLE amputation    Baseline  01/26/21: 59; 03/02/21: 71; 04/06/21: 57; 06/17/21: 59    Time 8    Period Weeks    Status Partially Met    Target Date 07/13/21      PT LONG TERM GOAL #4   Title Pt will increase self-selected 10MWT to at least 0.6 m/s in order to demonstrate clinically significant improvement in community ambulation.    Baseline 01/26/21: 31.5s = 0.32 m/s; 03/02/21: 25.5s = 0.39 m/s; 04/06/21: 20.7s = 0.48 m/s; 06/17/21: 20.6s = 0.49 m/s    Time 8    Period Weeks    Status Partially Met    Target Date 07/13/21                   Plan - 06/24/21 5929     Clinical Impression Statement Pt arrives reporting dizziness and instability. She remains dizzy during intake history sitting at rest. Vitals obtained and dynamometer reading bradycardia in the 40's bpm. Manual count of HR by auscultation is 100 bpm. Heart rate feels irregular with one strong beat followed by a very thready beat. Upon ausculation two beats followed by a pause. This occurs consistently interspersed with some irregular beats throughout. Pt denies any focal numbness/tingling or weakness. Facial strength and sensation intact and symmetrical. No dysarthria noted. No spontaneous nystagmus observed. Upon walking out of the clinic pt almost falls multiple times requiring assist from therapist. This is a gross decline from her normal balance. Therapist escorted patient to [facility] next door and handed off care the RN at clinic.    Personal Factors and Comorbidities Age;Comorbidity 3+    Comorbidities Depression, osteoporosis, vascular disease    Examination-Activity Limitations Bathing;Locomotion Level;Squat;Stairs;Stand;Transfers    Examination-Participation Restrictions Cleaning;Community Activity;Meal Prep;Yard Work    Stability/Clinical Decision Making Unstable/Unpredictable    Rehab Potential Good    PT Frequency 2x / week    PT Duration 12 weeks    PT Treatment/Interventions ADLs/Self Care Home Management;Aquatic  Therapy;Canalith Repostioning;Cryotherapy;Electrical Stimulation;Iontophoresis 62m/ml Dexamethasone;Moist Heat;Traction;Ultrasound;Gait training;Stair training;Therapeutic activities;Therapeutic exercise;Neuromuscular re-education;Manual techniques;Dry needling;Vestibular;Spinal Manipulations;Joint Manipulations    PT Next Visit Plan Strengthening, practice gait training with lofstrand crutches    PT Home Exercise Plan Access Code: [email or web address]    Consulted and Agree with Plan of  Care Patient                 Patient will benefit from skilled therapeutic intervention in order to improve the following deficits and impairments:  Abnormal gait, Decreased strength, Difficulty walking  Visit Diagnosis: Muscle weakness (generalized)  Difficulty in walking, not elsewhere classified  Unsteadiness on feet  Pain in right leg     Problem List Patient Active Problem List   Diagnosis Date Noted   Muscle spasm 09/09/2020   S/P AKA (above knee amputation), right ([place]) 09/08/2020   Ischemia of extremity 07/02/2020   Chronic pain in right shoulder 11/25/2019   Encounter for long-term (current) use of high-risk medication 07/16/2019   GCA (giant cell arteritis) ([place]) 07/16/2019   Temporal arteritis ([place]) 07/13/2019   Postoperative wound infection 06/23/2019   Ischemic leg 05/31/2019   Atherosclerosis of native arteries of extremity with intermittent claudication ([place]) 05/30/2019   Depression 05/29/2019   Eczema of lower extremity 03/04/2018   Chronic venous insufficiency 03/02/2018   Personal history of colonic polyps    Family history of colonic polyps    Benign neoplasm of ascending colon    Mitral valve insufficiency 02/08/2017   Dyspnea on exertion 02/07/2017   Non-rheumatic mitral regurgitation 02/07/2017   Precordial pain 02/07/2017   CAD (coronary artery disease) 12/21/2016   Essential hypertension 12/21/2016   Compression fracture of lumbar spine, non-traumatic ([place]) 04/26/2016    Compression fracture of L3 lumbar vertebra 04/20/2016   PVD (peripheral vascular disease) ([place]) 02/24/2016   Family history of premature CAD 02/24/2016   Gastritis    Other specified diseases of esophagus    Hiatal hernia    Gastritis and gastroduodenitis    Ischemia of lower extremity    Arterial occlusion (H[facility])    Atherosclerosis of aorta ([place])    History of smoking    Hyperlipidemia    Pain in the chest    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 02/05/2016   Carotid artery stenosis 01/04/2016   Vertigo, benign paroxysmal 12/28/2015   Clinical depression 09/06/2015   History of alcoholism ([place]) 09/06/2015   Angiopathy, 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[place]) 09/06/2015   Atherosclerosis of native arteries of extremity with rest pain ([place]) 09/06/2015   Acute non-recurrent maxillary sinusitis 07/14/2015   Vaginal pruritus 05/26/2015   Chronic recurrent major depressive disorder ([place]) 03/09/2015   Osteoporosis, post-menopausal 03/09/2015   Peripheral blood vessel disorder ([place]) 03/09/2015   Acid reflux 03/09/2015   GERD (gastroesophageal reflux disease) 12/21/2014   Carotid artery narrowing 01/28/2014   Peripheral arterial occlusive disease ([place]) 06/08/2011   Occlusion and stenosis of unspecified carotid artery 06/08/2011   PAD (peripheral artery disease) ([place]) 06/08/2011   Lyndel Safe Roy Tokarz PT, DPT, GCS  Havana Baldwin 06/24/2021, 10:44 AM   [facility] [address] [place], [place], [address] Phone: [phone]   Fax:  [phone]  Name: Sara [name] MRN: [id number] Date of Birth: July 08, 1951

## 2021-06-24 NOTE — Discharge Instructions (Signed)
Please go to [facility] for further evaluation of your abnormal heart rhythm and your lightheadedness.

## 2021-06-24 NOTE — ED Triage Notes (Signed)
Pt comes via EMS from [place] with c/o dizziness. Pt denies any pain. VSS

## 2021-06-24 NOTE — ED Provider Notes (Signed)
[facility]    CSN: [id number] Arrival date & time: 06/24/21  1024      History   Chief Complaint Chief Complaint  Patient presents with   Dizziness    HPI Sara [name] is a 70 y.o. female.   HPI  70 year old female here for evaluation of dizziness upon standing.  Patient reports that when she woke up this morning she had a feeling of being off balance.  There is no room spinning or overt dizziness and this is not associated with changes in vision, nausea, or vomiting.  She states that she drank 2 cups of coffee this morning and then drove her self to physical therapy where she continued to have symptoms and was brought down for evaluation.  Patient reports that she was fine when she went to bed last night and she woke up feeling off balance morning.  She denies any chest pain or shortness of breath.  She denies any numbness, tingling, weakness in any of her extremities.  Patient reports that she did have some head pressure which is improving.  She has not had any symptoms like this in the past.  Patient does see cardiology at [facility] medical, Dr. Chancy Milroy.  Past Medical History:  Diagnosis Date   Arthritis    right shoulder   Depression    Dyspnea    GERD (gastroesophageal reflux disease)    History of blood clots    Hyperlipidemia    Hypertension    Mild mitral regurgitation    Osteoporosis    Peripheral vascular disease (HCC)    Stomach ulcer    Vascular disease    Sees Dr. 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   Ischemia of extremity 07/02/2020   Chronic pain in right shoulder 11/25/2019   Encounter for long-term (current) use of high-risk medication 07/16/2019   GCA (giant cell arteritis) ([place]) 07/16/2019   Temporal arteritis ([place]) 07/13/2019   Postoperative wound  infection 06/23/2019   Ischemic leg 05/31/2019   Atherosclerosis of native arteries of extremity with intermittent claudication ([place]) 05/30/2019   Depression 05/29/2019   Eczema of lower extremity 03/04/2018   Chronic venous insufficiency 03/02/2018   Personal history of colonic polyps    Family history of colonic polyps    Benign neoplasm of ascending colon    Mitral valve insufficiency 02/08/2017   Dyspnea on exertion 02/07/2017   Non-rheumatic mitral regurgitation 02/07/2017   Precordial pain 02/07/2017   CAD (coronary artery disease) 12/21/2016   Essential hypertension 12/21/2016   Compression fracture of lumbar spine, non-traumatic ([place]) 04/26/2016   Compression fracture of L3 lumbar vertebra 04/20/2016   PVD (peripheral vascular disease) ([place]) 02/24/2016   Family history of premature CAD 02/24/2016   Gastritis    Other specified diseases of esophagus    Hiatal hernia    Gastritis and gastroduodenitis    Ischemia of lower extremity    Arterial occlusion (HCC)    Atherosclerosis of aorta (HCC)    History of smoking    Hyperlipidemia    Pain in the chest    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 02/05/2016   Carotid artery stenosis 01/04/2016   Vertigo, benign paroxysmal 12/28/2015   Clinical depression 09/06/2015   History  of alcoholism ([place]) 09/06/2015   Angiopathy, 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[place]) 09/06/2015   Atherosclerosis of native arteries of extremity with rest pain ([place]) 09/06/2015   Acute non-recurrent maxillary sinusitis 07/14/2015   Vaginal pruritus 05/26/2015   Chronic recurrent major depressive disorder ([place]) 03/09/2015   Osteoporosis, post-menopausal 03/09/2015   Peripheral blood vessel disorder ([place]) 03/09/2015   Acid reflux 03/09/2015   GERD (gastroesophageal reflux disease) 12/21/2014   Carotid artery narrowing 01/28/2014   Peripheral arterial occlusive disease ([place]) 06/08/2011   Occlusion and stenosis of unspecified carotid artery 06/08/2011    PAD (peripheral artery disease) ([place]) 06/08/2011    Past Surgical History:  Procedure Laterality Date   ADENOIDECTOMY     AMPUTATION Right 08/11/2020   Procedure: AMPUTATION ABOVE KNEE;  Surgeon: [name], MD;  Location: [facility];  Service: Vascular;  Laterality: Right;   ARTERY BIOPSY Right 07/14/2019   Procedure: BIOPSY TEMPORAL ARTERY;  Surgeon: [name], MD;  Location: [facility];  Service: Vascular;  Laterality: Right;   BREAST CYST ASPIRATION Left    CARDIAC CATHETERIZATION  02/15/2017   [facility]   COLONOSCOPY WITH PROPOFOL N/A 10/22/2017   Procedure: COLONOSCOPY WITH PROPOFOL;  Surgeon: Lucilla Lame, MD;  Location: [place];  Service: Endoscopy;  Laterality: N/A;  specimens not taken--pt on Plavix will be brought back in after 7 days off med   COLONOSCOPY WITH PROPOFOL N/A 11/05/2017   Procedure: COLONOSCOPY WITH PROPOFOL;  Surgeon: Lucilla Lame, MD;  Location: [place];  Service: Endoscopy;  Laterality: N/A;   ESOPHAGOGASTRODUODENOSCOPY N/A 12/21/2014   Procedure: ESOPHAGOGASTRODUODENOSCOPY (EGD);  Surgeon: Lucilla Lame, MD;  Location: [place];  Service: Gastroenterology;  Laterality: N/A;   ESOPHAGOGASTRODUODENOSCOPY (EGD) WITH PROPOFOL N/A 02/08/2016   Procedure: ESOPHAGOGASTRODUODENOSCOPY (EGD) WITH PROPOFOL;  Surgeon: Lucilla Lame, MD;  Location: [facility];  Service: Endoscopy;  Laterality: N/A;   FASCIOTOMY Right 05/30/2019   Procedure: FASCIOTOMY;  Surgeon: [name], MD;  Location: [facility];  Service: Vascular;  Laterality: Right;   FASCIOTOMY CLOSURE Right 06/04/2019   Procedure: FASCIOTOMY CLOSURE;  Surgeon: [name], MD;  Location: [facility];  Service: Vascular;  Laterality: Right;   HEMORROIDECTOMY  2014   LOWER EXTREMITY ANGIOGRAPHY Right 05/30/2019   Procedure: LOWER EXTREMITY ANGIOGRAPHY;  Surgeon: [name], MD;  Location: [place] [facility];  Service: Cardiovascular;  Laterality: Right;    LOWER EXTREMITY ANGIOGRAPHY Right 07/02/2020   Procedure: LOWER EXTREMITY ANGIOGRAPHY;  Surgeon: [name], MD;  Location: [place] [facility];  Service: Cardiovascular;  Laterality: Right;   PERIPHERAL VASCULAR CATHETERIZATION N/A 02/09/2016   Procedure: Abdominal Aortogram w/Lower Extremity;  Surgeon: [name], MD;  Location: [place] [facility];  Service: Cardiovascular;  Laterality: N/A;   PERIPHERAL VASCULAR CATHETERIZATION Right 02/10/2016   Procedure: Lower Extremity Angiography;  Surgeon: [name], MD;  Location: [place] [facility];  Service: Cardiovascular;  Laterality: Right;   POLYPECTOMY  11/05/2017   Procedure: POLYPECTOMY;  Surgeon: Lucilla Lame, MD;  Location: [place];  Service: Endoscopy;;   SHOULDER ARTHROSCOPY WITH ROTATOR CUFF REPAIR AND SUBACROMIAL DECOMPRESSION Right 02/27/2020   Procedure: RIGHT SHOULDER ARTHROSCOPY SUBACROMIAL DECOMPRESSION, DISTAL CLAVICLE EXCISION AND MINI-OPEN ROTATOR CUFF REPAIR;  Surgeon: Thornton Park, MD;  Location: [facility];  Service: Orthopedics;  Laterality: Right;   TONSILLECTOMY     VASCULAR SURGERY  [id number]   Fem-Pop Bypass    OB History   No obstetric history on file.      Home  Medications    Prior to Admission medications   Medication Sig Start Date End Date Taking? Authorizing Provider  apixaban (ELIQUIS) 5 MG TABS tablet Take 1 tablet (5 mg total) by mouth 2 (two) times daily. 09/03/19  Yes Kris Hartmann, NP  atorvastatin (LIPITOR) 40 MG tablet Take 40 mg by mouth at bedtime.  12/08/16  Yes [provider]  calcium carbonate (OSCAL) 1500 (600 Ca) MG TABS tablet Take 1,200 mg of elemental calcium by mouth 2 (two) times daily with a meal.   Yes [provider]  cholecalciferol (VITAMIN D3) 25 MCG (1000 UNIT) tablet Take 1,000 Units by mouth daily.   Yes [provider]  diltiazem (CARDIZEM CD) 240 MG 24 hr capsule Take 240 mg by mouth daily. 01/05/20  Yes [provider]  DULoxetine (CYMBALTA) 60 MG capsule Take 60 mg by mouth every morning. 01/04/21  Yes [provider]  gabapentin (NEURONTIN) 300 MG capsule TAKE 1 CAPSULE BY MOUTH THREE TIMES A DAY 04/20/21  Yes Kris Hartmann, NP  ibandronate (BONIVA) 150 MG tablet Take 150 mg by mouth every 30 (thirty) days.  12/07/16  Yes [provider]  losartan (COZAAR) 25 MG tablet Take 25 mg by mouth every morning.  12/01/16  Yes [provider]  omeprazole (PRILOSEC) 20 MG capsule Take 20 mg by mouth daily.   Yes [provider]  oxyCODONE-acetaminophen (PERCOCET/ROXICET) 5-325 MG tablet Take 1-2 tablets by mouth every 4 (four) hours as needed for moderate pain. 08/16/20  Yes Dew, Erskine Squibb, MD  Tocilizumab (ACTEMRA ACTPEN) 162 MG/0.9ML SOAJ Inject 162 mg into the skin once a week.    Yes [provider]  zolpidem (AMBIEN) 5 MG tablet Take 5 mg by mouth at bedtime as needed for sleep.  07/03/19  Yes [provider]  clobetasol cream (TEMOVATE) 1.93 % Apply 1 application topically daily as needed (itching). 04/07/20   [provider]  cyclobenzaprine (FLEXERIL) 10 MG tablet Take 1 tablet (10 mg total) by mouth 3 (three) times daily as needed for muscle spasms. 09/09/20   Schnier, Dolores Lory, MD  pantoprazole (PROTONIX) 40 MG tablet TAKE 1 TABLET DAILY 11/29/20   Lucilla Lame, MD  venlafaxine XR (EFFEXOR-XR) 150 MG 24 hr capsule Take 150 mg by mouth daily with breakfast.    [provider]    Family History Family History  Problem Relation Age of Onset   CVA Mother    Heart attack Mother    Cancer Father        colon cancer   Colon cancer Father    Heart disease Maternal Uncle    Breast cancer Neg Hx     Social History Social History   Tobacco Use   Smoking status: Former    Packs/day: 2.00    Years: 25.00    Pack years: 50.00    Types: Cigarettes    Quit date: 08/22/1991    Years since quitting: 29.8   Smokeless tobacco: Never   Vaping Use   Vaping Use: Never used  Substance Use Topics   Alcohol use: No    Alcohol/week: 0.0 standard drinks   Drug use: Never     Allergies   Hydrocodone-acetaminophen, Amoxicillin, Metoprolol tartrate, Oxycodone, and Vicodin [hydrocodone-acetaminophen]   Review of Systems Review of Systems  Constitutional:  Negative for activity change and appetite change.  Eyes:  Negative for visual disturbance.  Respiratory:  Negative for shortness of breath and wheezing.   Cardiovascular:  Negative for chest pain and palpitations.  Gastrointestinal:  Negative for nausea and vomiting.  Skin:  Negative for rash.  Neurological:  Positive for light-headedness and headaches. Negative for dizziness, syncope, speech difficulty, weakness and numbness.       Generalized head pressure.  Hematological: Negative.   Psychiatric/Behavioral: Negative.      Physical Exam Triage Vital Signs ED Triage Vitals  Enc Vitals Group     BP 06/24/21 1036 119/74     Pulse Rate 06/24/21 1036 98     Resp 06/24/21 1036 14     Temp 06/24/21 1036 98.8 F (37.1 C)     Temp Source 06/24/21 1036 Oral     SpO2 06/24/21 1036 95 %     Weight 06/24/21 1032 122 lb (55.3 kg)     Height 06/24/21 1032 5\' 4"  (1.626 m)     Head Circumference --      Peak Flow --      Pain Score 06/24/21 1032 7     Pain Loc --      Pain Edu? --      Excl. in [place]? --    No data found.  Updated Vital Signs BP 119/74 (BP Location: Left Arm)   Pulse 98   Temp 98.8 F (37.1 C) (Oral)   Resp 14   Ht 5\' 4"  (1.626 m)   Wt 122 lb (55.3 kg)   SpO2 95%   BMI 20.94 kg/m   Visual Acuity Right Eye Distance:   Left Eye Distance:   Bilateral Distance:    Right Eye Near:   Left Eye Near:    Bilateral Near:     Physical Exam Vitals and nursing note reviewed.  Constitutional:      General: She is not in acute distress.    Appearance: Normal appearance. She is normal weight. She is not ill-appearing.  HENT:     Head:  Normocephalic and atraumatic.     Right Ear: Tympanic membrane, ear canal and external ear normal. There is no impacted cerumen.     Left Ear: Tympanic membrane, ear canal and external ear normal. There is no impacted cerumen.     Nose: Nose normal.     Mouth/Throat:     Mouth: Mucous membranes are moist.     Pharynx: Oropharynx is clear. No posterior oropharyngeal erythema.  Eyes:     General: No scleral icterus.    Extraocular Movements: Extraocular movements intact.     Conjunctiva/sclera: Conjunctivae normal.     Pupils: Pupils are equal, round, and reactive to light.  Neck:     Vascular: No carotid bruit.  Cardiovascular:     Rate and Rhythm: Normal rate. Rhythm irregular.     Pulses: Normal pulses.     Heart sounds: No murmur heard.   No gallop.     Comments: Patient is having consistent PACs. Pulmonary:     Effort: Pulmonary effort is normal.     Breath sounds: Normal breath sounds. No wheezing, rhonchi or rales.  Musculoskeletal:     Cervical back: Normal range of motion and neck supple. No tenderness.  Lymphadenopathy:     Cervical: No cervical adenopathy.  Skin:    General: Skin is warm and dry.     Capillary Refill: Capillary refill takes less than 2 seconds.     Findings: No erythema or rash.  Neurological:     General: No focal deficit present.     Mental Status: She is alert and oriented  to person, place, and time.  Psychiatric:        Mood and Affect: Mood normal.        Behavior: Behavior normal.        Thought Content: Thought content normal.        Judgment: Judgment normal.     UC Treatments / Results  Labs (all labs ordered are listed, but only abnormal results are displayed) Labs Reviewed  COMPREHENSIVE METABOLIC PANEL - Abnormal; Notable for the following components:      Result Value   Glucose, Bld 105 (*)    Calcium 8.7 (*)    All other components within normal limits  CBC WITH DIFFERENTIAL/PLATELET    EKG   Radiology No results  found.  Procedures Procedures (including critical care time)  Medications Ordered in UC Medications - No data to display  Initial Impression / Assessment and Plan / UC Course  I have reviewed the triage vital signs and the nursing notes.  Pertinent labs & imaging results that were available during my care of the patient were reviewed by me and considered in my medical decision making (see chart for details).  Patient is a nontoxic-appearing 70 year old female here for a feeling of being off balance that was present when she woke up this morning and is still present.  This is not associated with any numbness, tingling, weakness in upper extremities, chest pain or shortness of breath, nausea or vomiting, or changes to vision.  She states that she is never had anything like this in the past.  Physical exam reveals cranial nerves II through XII intact.  Pupils are equal round reactive and EOMs intact.  No nystagmus or strabismus.  No carotid bruits when auscultating over the carotid arteries bilaterally.  Heart sounds reveal consistent PACs.  Lung sounds are clear to auscultation all fields.  Peripheral pulses are 2+ globally.  Patient is missing her right lower leg that was amputated recently as result of ischemia.  She was at physical therapy today when her symptoms intensified and she was brought down for evaluation.  She reports that she was having some head pressure but that is resolving.  Patient's bilateral grips are 5/5 and her upper extremity strength is 5/5.  Left lower extremity strength is 5/5.  EKG was collected at triage shows sinus rhythm with sinus arrhythmia and occasional PVCs per the report.  However upon inspection the patient is actually having PACs.  This was compared to EKG from 08/09/2020 which showed normal sinus rhythm at that time.  There is no ST or T wave abnormality noted.  Unsure of the cause of the patient's abnormal heart rhythm though potential causes are caffeine use,  dehydration, electrolyte abnormality.  Will check CBC, CMP, and will consult Dr. Humphrey Rolls at [facility] medical.  CBC is unremarkable.  CMP shows a mildly decreased calcium of 8.7 but electrolytes, renal function, and transaminases are all within normal limits.  Upon reassessment asked patient if she was still feeling off balance and she says that she is not as she has been laying there.  When listening to her heart rhythm she is irregularly irregular.  Will obtain repeat EKG to look for possible change in heart rhythm.  Repeat EKG this rhythm undetermined rhythm.  Patient continues to have PACs and there is questionable development of type II heart block.  Patient continues to be pain-free and is not having any shortness of breath.  I discussed that I feels appropriate for her to be evaluated  in the emergency department.  She was initially reluctant as she does not have her phone with her and she has 2 small dogs that she needs taken care of at home.  After further discussion she is willing to go to the hospital by EMS.  I attempted to reach Dr. Laurelyn Sickle office without success.  Were given to [facility] paramedic.  Care transferred.   Final Clinical Impressions(s) / UC Diagnoses   Final diagnoses:  Dizziness  Cardiac arrhythmia, unspecified cardiac arrhythmia type   Discharge Instructions   None    ED Prescriptions   None    PDMP not reviewed this encounter.   Margarette Canada, NP 06/24/21 1300

## 2021-06-24 NOTE — ED Triage Notes (Signed)
Patient is being discharged from the Urgent Care and sent to the Emergency Department via ems . Per Lillia Carmel NP, patient is in need of higher level of care due to EKG changes and dizziness. Patient is aware and verbalizes understanding of plan of care.  Vitals:   06/24/21 1036  BP: 119/74  Pulse: 98  Resp: 14  Temp: 98.8 F (37.1 C)  SpO2: 95%

## 2021-06-27 ENCOUNTER — Ambulatory Visit: Payer: Medicare Other

## 2021-06-27 ENCOUNTER — Other Ambulatory Visit: Payer: Self-pay

## 2021-06-27 VITALS — BP 135/60 | Temp 98.0°F

## 2021-06-27 DIAGNOSIS — M6281 Muscle weakness (generalized): Secondary | ICD-10-CM

## 2021-06-27 DIAGNOSIS — R2681 Unsteadiness on feet: Secondary | ICD-10-CM

## 2021-06-27 DIAGNOSIS — R262 Difficulty in walking, not elsewhere classified: Secondary | ICD-10-CM

## 2021-06-27 NOTE — Therapy (Signed)
[facility] [address] [place], [place], [address] Phone: [phone]   Fax:  [phone]  Physical Therapy Treatment  Patient Details  Name: Sara [name] MRN: [id number] Date of Birth: August 24, 1950 Referring Provider (PT): Dr. Delana Meyer   Encounter Date: 06/27/2021   PT End of Session - 06/27/21 1413     Visit Number 23    Number of Visits 11    Date for PT Re-Evaluation 07/13/21    Authorization Type eval: 01/26/21    PT Start Time 6314    PT Stop Time 1530    PT Time Calculation (min) 45 min    Equipment Utilized During Treatment Gait belt    Activity Tolerance Patient tolerated treatment well    Behavior During Therapy [facility] for tasks assessed/performed                 Past Medical History:  Diagnosis Date   Arthritis    right shoulder   Depression    Dyspnea    GERD (gastroesophageal reflux disease)    History of blood clots    Hyperlipidemia    Hypertension    Mild mitral regurgitation    Osteoporosis    Peripheral vascular disease ([place])    Stomach ulcer    Vascular disease    Sees Dr. Delana Meyer   Vertigo    Last episode approx Aug 2015   Wears dentures    full upper    Past Surgical History:  Procedure Laterality Date   ADENOIDECTOMY     AMPUTATION Right 08/11/2020   Procedure: AMPUTATION ABOVE KNEE;  Surgeon: [name], MD;  Location: [facility];  Service: Vascular;  Laterality: Right;   ARTERY BIOPSY Right 07/14/2019   Procedure: BIOPSY TEMPORAL ARTERY;  Surgeon: [name], MD;  Location: [facility];  Service: Vascular;  Laterality: Right;   BREAST CYST ASPIRATION Left    CARDIAC CATHETERIZATION  02/15/2017   [facility]   COLONOSCOPY WITH PROPOFOL N/A 10/22/2017   Procedure: COLONOSCOPY WITH PROPOFOL;  Surgeon: Lucilla Lame, MD;  Location: [place];  Service: Endoscopy;  Laterality: N/A;  specimens not taken--pt on Plavix will be brought back in after 7 days off med   COLONOSCOPY  WITH PROPOFOL N/A 11/05/2017   Procedure: COLONOSCOPY WITH PROPOFOL;  Surgeon: Lucilla Lame, MD;  Location: [place];  Service: Endoscopy;  Laterality: N/A;   ESOPHAGOGASTRODUODENOSCOPY N/A 12/21/2014   Procedure: ESOPHAGOGASTRODUODENOSCOPY (EGD);  Surgeon: Lucilla Lame, MD;  Location: [place];  Service: Gastroenterology;  Laterality: N/A;   ESOPHAGOGASTRODUODENOSCOPY (EGD) WITH PROPOFOL N/A 02/08/2016   Procedure: ESOPHAGOGASTRODUODENOSCOPY (EGD) WITH PROPOFOL;  Surgeon: Lucilla Lame, MD;  Location: [facility];  Service: Endoscopy;  Laterality: N/A;   FASCIOTOMY Right 05/30/2019   Procedure: FASCIOTOMY;  Surgeon: [name], MD;  Location: [facility];  Service: Vascular;  Laterality: Right;   FASCIOTOMY CLOSURE Right 06/04/2019   Procedure: FASCIOTOMY CLOSURE;  Surgeon: [name], MD;  Location: [facility];  Service: Vascular;  Laterality: Right;   HEMORROIDECTOMY  2014   LOWER EXTREMITY ANGIOGRAPHY Right 05/30/2019   Procedure: LOWER EXTREMITY ANGIOGRAPHY;  Surgeon: [name], MD;  Location: [place] [facility];  Service: Cardiovascular;  Laterality: Right;   LOWER EXTREMITY ANGIOGRAPHY Right 07/02/2020   Procedure: LOWER EXTREMITY ANGIOGRAPHY;  Surgeon: [name], MD;  Location: [place] [facility];  Service: Cardiovascular;  Laterality: Right;   PERIPHERAL VASCULAR CATHETERIZATION N/A 02/09/2016   Procedure: Abdominal Aortogram w/Lower  Extremity;  Surgeon: [name], MD;  Location: [place] [facility];  Service: Cardiovascular;  Laterality: N/A;   PERIPHERAL VASCULAR CATHETERIZATION Right 02/10/2016   Procedure: Lower Extremity Angiography;  Surgeon: [name], MD;  Location: [place] [facility];  Service: Cardiovascular;  Laterality: Right;   POLYPECTOMY  11/05/2017   Procedure: POLYPECTOMY;  Surgeon: Lucilla Lame, MD;  Location: [place];  Service: Endoscopy;;   SHOULDER ARTHROSCOPY WITH ROTATOR CUFF REPAIR AND  SUBACROMIAL DECOMPRESSION Right 02/27/2020   Procedure: RIGHT SHOULDER ARTHROSCOPY SUBACROMIAL DECOMPRESSION, DISTAL CLAVICLE EXCISION AND MINI-OPEN ROTATOR CUFF REPAIR;  Surgeon: Thornton Park, MD;  Location: [facility];  Service: Orthopedics;  Laterality: Right;   TONSILLECTOMY     VASCULAR SURGERY  1991,2012   Fem-Pop Bypass    Vitals:   06/27/21 1500  BP: 135/60  Temp: 98 F (36.7 C)  SpO2: 98%       Subjective Assessment - 06/27/21 1413     Subjective Pt reports that she is doing well today. She states that her dizziness symptoms have resolved. She was sent via ambulance to [facility] ED from the [facility] and then discharged from the ED later Friday evening. She was advised to follow-up with cardiology. She saw the cardiologist this morning who ordered a cardiac stress test as well as a holter monitor.    Pertinent History Pt has a history of RLE vascular issues since 1991. 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 She received her RLE prosthetic in April 2022 and has been receiving [place] [facility] since her surgery. They discharged her yesterday to continue with OP PT. She continues with phantom RLE pain, especially at night and takes gabapentin with minimal relief. She has also been having R shoulder pain since the surgery which she attributes to using a wheelchair. She underwent R RTC surgery in September of 2021 and had physical therapy afterwards. She has been having issues with bilateral carpal tunnel and has a NCV study scheduled for tomorrow.    Limitations Walking    Patient Stated Goals Ambulate with her prosthetic    Currently in Pain? No/denies                TREATMENT   Ther-ex  NuStep L0-1 x 5 minutes BLE only for warm-up during interval history;  With prosthetic doffed: Supine R SLR hip flexion with manual resistance from therapist 2 x 20; Supine RLE bridge with residual RLE resting on stool (towel on top for cushion), arms  at side, 3s hold, x 10, deferred additional set due to increase in distal RLE pain; Supine RLE long axis manually resisted external and internal rotation 2 x 20 each; Supine RLE adduction with manual resistance 2 x 20; L sidelying R hip abduction with manual resistance 2 x 20; L sidelying R hip extension from 90 hip flexion to available end range extension with manual resistance 2 x 20;   Neuromuscular Re-education  Mirror therapy performed with patient sitting at edge of mat table and mirror between legs to simulate right lower extremity.  While watching the mirror with LLE patient performed seated marches x 1 minute with 4# ankle weight, long arc quads with 4# ankle weight x 1 minute, L ankle AROM plantar flexion/dorsiflexion x 1 minute, L ankle ball circles CW/CCW x 1 minute;   Pt educated throughout session about proper posture and technique with exercises. Improved exercise technique, movement at target joints, use of target muscles after  min to mod verbal, visual, tactile cues.      Patient demonstrates excellent motivation during session today. Session today focused on RLE strengthening. Continued with mirror therapy to help with phantom limb pain.  She denies any dizziness throughout entirety of session.  Patient is complaining of some distal right lower extremity pain.  Examination of distal RLE without any signs of ecchymosis, edema, or erythema.  It is slightly tender to palpation.  Patient encouraged to continue HEP and follow-up as scheduled. Will continue to progress dynamic balance, R single leg stability, and BLE strength. She will benefit from PT services to address deficits in strength, balance, and mobility in order to return to full function at home.                            PT Short Term Goals - 06/17/21 1103       PT SHORT TERM GOAL #1   Title Pt will be independent with HEP in order to improve strength and balance in order to decrease fall risk and  improve function at home.    Time 6    Period Weeks    Status On-going    Target Date 06/01/21               PT Long Term Goals - 06/17/21 1103       PT LONG TERM GOAL #1   Title Pt will improve BERG to at least 52/56 in order to demonstrate clinically significant improvement in balance and decrease risk for falls;    Baseline 01/26/21: 45/56; 03/02/21: 47/56; 04/20/21: 49/56; 06/17/21: 49/56    Time 12    Period Weeks    Status Partially Met    Target Date 07/13/21      PT LONG TERM GOAL #2   Title Pt will improve ABC by at least 13% in order to demonstrate clinically significant improvement in balance confidence.    Baseline 01/26/21: 57.5%; 03/02/21: 89.4%    Time 8    Period Weeks    Status Achieved      PT LONG TERM GOAL #3   Title Pt will improve FOTO to at least 63 in order to demonstrate clinically significant improvement in function related to her RLE amputation    Baseline 01/26/21: 59; 03/02/21: 71; 04/06/21: 57; 06/17/21: 59    Time 8    Period Weeks    Status Partially Met    Target Date 07/13/21      PT LONG TERM GOAL #4   Title Pt will increase self-selected 10MWT to at least 0.6 m/s in order to demonstrate clinically significant improvement in community ambulation.    Baseline 01/26/21: 31.5s = 0.32 m/s; 03/02/21: 25.5s = 0.39 m/s; 04/06/21: 20.7s = 0.48 m/s; 06/17/21: 20.6s = 0.49 m/s    Time 8    Period Weeks    Status Partially Met    Target Date 07/13/21                   Plan - 06/27/21 1413     Clinical Impression Statement Patient demonstrates excellent motivation during session today. Session today focused on RLE strengthening. Continued with mirror therapy to help with phantom limb pain.  She denies any dizziness throughout entirety of session.  Patient is complaining of some distal right lower extremity pain.  Examination of distal RLE without any signs of ecchymosis, edema, or erythema.  It is slightly tender to palpation.  Patient encouraged to  continue HEP and follow-up as scheduled. Will continue to progress dynamic balance, R single leg stability, and BLE strength. She will benefit from PT services to address deficits in strength, balance, and mobility in order to return to full function at home.    Personal Factors and Comorbidities Age;Comorbidity 3+    Comorbidities Depression, osteoporosis, vascular disease    Examination-Activity Limitations Bathing;Locomotion Level;Squat;Stairs;Stand;Transfers    Examination-Participation Restrictions Cleaning;Community Activity;Meal Prep;Yard Work    Stability/Clinical Decision Making Unstable/Unpredictable    Rehab Potential Good    PT Frequency 2x / week    PT Duration 12 weeks    PT Treatment/Interventions ADLs/Self Care Home Management;Aquatic Therapy;Canalith Repostioning;Cryotherapy;Electrical Stimulation;Iontophoresis 38m/ml Dexamethasone;Moist Heat;Traction;Ultrasound;Gait training;Stair training;Therapeutic activities;Therapeutic exercise;Neuromuscular re-education;Manual techniques;Dry needling;Vestibular;Spinal Manipulations;Joint Manipulations    PT Next Visit Plan Strengthening, practice gait training with lofstrand crutches    PT Home Exercise Plan Access Code: [email or web address]    Consulted and Agree with Plan of Care Patient                 Patient will benefit from skilled therapeutic intervention in order to improve the following deficits and impairments:  Abnormal gait, Decreased strength, Difficulty walking  Visit Diagnosis: Muscle weakness (generalized)  Difficulty in walking, not elsewhere classified  Unsteadiness on feet     Problem List Patient Active Problem List   Diagnosis Date Noted   Muscle spasm 09/09/2020   S/P AKA (above knee amputation), right ([facility] 09/08/2020   Ischemia of extremity 07/02/2020   Chronic pain in right shoulder 11/25/2019   Encounter for long-term (current) use of high-risk medication 07/16/2019   GCA (giant cell arteritis) ([facility]  07/16/2019   Temporal arteritis ([facility] 07/13/2019   Postoperative wound infection 06/23/2019   Ischemic leg 05/31/2019   Atherosclerosis of native arteries of extremity with intermittent claudication ([facility] 05/30/2019   Depression 05/29/2019   Eczema of lower extremity 03/04/2018   Chronic venous insufficiency 03/02/2018   Personal history of colonic polyps    Family history of colonic polyps    Benign neoplasm of ascending colon    Mitral valve insufficiency 02/08/2017   Dyspnea on exertion 02/07/2017   Non-rheumatic mitral regurgitation 02/07/2017   Precordial pain 02/07/2017   CAD (coronary artery disease) 12/21/2016   Essential hypertension 12/21/2016   Compression fracture of lumbar spine, non-traumatic ([facility] 04/26/2016   Compression fracture of L3 lumbar vertebra 04/20/2016   PVD (peripheral vascular disease) ([facility] 02/24/2016   Family history of premature CAD 02/24/2016   Gastritis    Other specified diseases of esophagus    Hiatal hernia    Gastritis and gastroduodenitis    Ischemia of lower extremity    Arterial occlusion (HCC)    Atherosclerosis of aorta ([facility]    History of smoking    Hyperlipidemia    Pain in the chest    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 02/05/2016   Carotid artery stenosis 01/04/2016   Vertigo, benign paroxysmal 12/28/2015   Clinical depression 09/06/2015   History of alcoholism ([facility] 09/06/2015   Angiopathy, 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[facility] 09/06/2015   Atherosclerosis of native arteries of extremity with rest pain ([facility] 09/06/2015   Acute non-recurrent maxillary sinusitis 07/14/2015   Vaginal pruritus 05/26/2015   Chronic recurrent major depressive disorder ([facility] 03/09/2015   Osteoporosis, post-menopausal 03/09/2015   Peripheral blood vessel disorder ([facility] 03/09/2015   Acid reflux 03/09/2015   GERD (gastroesophageal reflux disease) 12/21/2014   Carotid artery narrowing 01/28/2014   Peripheral arterial occlusive disease (  [place])  06/08/2011   Occlusion and stenosis of unspecified carotid artery 06/08/2011   PAD (peripheral artery disease) ([place]) 06/08/2011   Phillips Grout PT, DPT, GCS  Eliazer Hemphill 06/27/2021, 3:44 PM  [facility] [address] [place], [place], [address] Phone: [phone]   Fax:  [phone]  Name: Sara [name] MRN: [id number] Date of Birth: March 24, 1951

## 2021-06-29 ENCOUNTER — Ambulatory Visit: Payer: Medicare Other

## 2021-06-29 ENCOUNTER — Other Ambulatory Visit: Payer: Self-pay

## 2021-06-29 DIAGNOSIS — M6281 Muscle weakness (generalized): Secondary | ICD-10-CM

## 2021-06-29 DIAGNOSIS — R262 Difficulty in walking, not elsewhere classified: Secondary | ICD-10-CM

## 2021-06-29 NOTE — Therapy (Signed)
[facility] [address] [place], [place], [address] Phone: [phone]   Fax:  [phone]  Physical Therapy Treatment  Patient Details  Name: Sara [name] MRN: [id number] Date of Birth: [birth year] Referring Provider (PT): Dr. Delana Meyer   Encounter Date: 06/29/2021   PT End of Session - 06/29/21 1437     Visit Number 45    Number of Visits 52    Date for PT Re-Evaluation 07/13/21    Authorization Type eval: 01/26/21    PT Start Time 1015    PT Stop Time 1100    PT Time Calculation (min) 45 min    Equipment Utilized During Treatment Gait belt    Activity Tolerance Patient tolerated treatment well    Behavior During Therapy [facility] for tasks assessed/performed                 Past Medical History:  Diagnosis Date   Arthritis    right shoulder   Depression    Dyspnea    GERD (gastroesophageal reflux disease)    History of blood clots    Hyperlipidemia    Hypertension    Mild mitral regurgitation    Osteoporosis    Peripheral vascular disease ([place])    Stomach ulcer    Vascular disease    Sees Dr. Delana Meyer   Vertigo    Last episode approx Aug 2015   Wears dentures    full upper    Past Surgical History:  Procedure Laterality Date   ADENOIDECTOMY     AMPUTATION Right 08/11/2020   Procedure: AMPUTATION ABOVE KNEE;  Surgeon: [name], MD;  Location: [facility];  Service: Vascular;  Laterality: Right;   ARTERY BIOPSY Right 07/14/2019   Procedure: BIOPSY TEMPORAL ARTERY;  Surgeon: [name], MD;  Location: [facility];  Service: Vascular;  Laterality: Right;   BREAST CYST ASPIRATION Left    CARDIAC CATHETERIZATION  02/15/2017   [facility]   COLONOSCOPY WITH PROPOFOL N/A 10/22/2017   Procedure: COLONOSCOPY WITH PROPOFOL;  Surgeon: Lucilla Lame, MD;  Location: [place];  Service: Endoscopy;  Laterality: N/A;  specimens not taken--pt on Plavix will be brought back in after 7 days off med   COLONOSCOPY  WITH PROPOFOL N/A 11/05/2017   Procedure: COLONOSCOPY WITH PROPOFOL;  Surgeon: Lucilla Lame, MD;  Location: [place];  Service: Endoscopy;  Laterality: N/A;   ESOPHAGOGASTRODUODENOSCOPY N/A 12/21/2014   Procedure: ESOPHAGOGASTRODUODENOSCOPY (EGD);  Surgeon: Lucilla Lame, MD;  Location: [place];  Service: Gastroenterology;  Laterality: N/A;   ESOPHAGOGASTRODUODENOSCOPY (EGD) WITH PROPOFOL N/A 02/08/2016   Procedure: ESOPHAGOGASTRODUODENOSCOPY (EGD) WITH PROPOFOL;  Surgeon: Lucilla Lame, MD;  Location: [facility];  Service: Endoscopy;  Laterality: N/A;   FASCIOTOMY Right 05/30/2019   Procedure: FASCIOTOMY;  Surgeon: [name], MD;  Location: [facility];  Service: Vascular;  Laterality: Right;   FASCIOTOMY CLOSURE Right 06/04/2019   Procedure: FASCIOTOMY CLOSURE;  Surgeon: [name], MD;  Location: [facility];  Service: Vascular;  Laterality: Right;   HEMORROIDECTOMY  2014   LOWER EXTREMITY ANGIOGRAPHY Right 05/30/2019   Procedure: LOWER EXTREMITY ANGIOGRAPHY;  Surgeon: [name], MD;  Location: [place] [facility];  Service: Cardiovascular;  Laterality: Right;   LOWER EXTREMITY ANGIOGRAPHY Right 07/02/2020   Procedure: LOWER EXTREMITY ANGIOGRAPHY;  Surgeon: [name], MD;  Location: [place] [facility];  Service: Cardiovascular;  Laterality: Right;   PERIPHERAL VASCULAR CATHETERIZATION N/A 02/09/2016   Procedure: Abdominal Aortogram w/Lower  Extremity;  Surgeon: [name], MD;  Location: [place] [facility];  Service: Cardiovascular;  Laterality: N/A;   PERIPHERAL VASCULAR CATHETERIZATION Right 02/10/2016   Procedure: Lower Extremity Angiography;  Surgeon: [name], MD;  Location: [place] [facility];  Service: Cardiovascular;  Laterality: Right;   POLYPECTOMY  11/05/2017   Procedure: POLYPECTOMY;  Surgeon: Lucilla Lame, MD;  Location: [place];  Service: Endoscopy;;   SHOULDER ARTHROSCOPY WITH ROTATOR CUFF REPAIR AND  SUBACROMIAL DECOMPRESSION Right 02/27/2020   Procedure: RIGHT SHOULDER ARTHROSCOPY SUBACROMIAL DECOMPRESSION, DISTAL CLAVICLE EXCISION AND MINI-OPEN ROTATOR CUFF REPAIR;  Surgeon: Thornton Park, MD;  Location: [facility];  Service: Orthopedics;  Laterality: Right;   TONSILLECTOMY     VASCULAR SURGERY  2956,2130   Fem-Pop Bypass    There were no vitals filed for this visit.      Subjective Assessment - 06/29/21 1436     Subjective Pt reports that she is doing well today. She denies any dizziness upon arrival today. After today's therapy session she is [place] for the beach until early next week. No specific questions or concerns currently.    Pertinent History Pt has a history of RLE vascular issues since 1991. 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 She received her RLE prosthetic in April 2022 and has been receiving [place] [facility] since her surgery. They discharged her yesterday to continue with OP PT. She continues with phantom RLE pain, especially at night and takes gabapentin with minimal relief. She has also been having R shoulder pain since the surgery which she attributes to using a wheelchair. She underwent R RTC surgery in September of 2021 and had physical therapy afterwards. She has been having issues with bilateral carpal tunnel and has a NCV study scheduled for tomorrow.    Limitations Walking    Patient Stated Goals Ambulate with her prosthetic    Currently in Pain? No/denies                TREATMENT   Ther-ex  NuStep L1-2 x 5 minutes BLE only for warm-up during interval history;  Side stepping with 4# ankle weight (AW) around ankles in // bars x 4 lengths; Forward/backward walking with 4# AW around ankles in // bars x 4 lengths;   Neuromuscular Re-education  Mirror therapy performed with patient sitting at edge of mat table and mirror between legs to simulate right lower extremity.  While watching the mirror with LLE patient  performed seated marches x 1 minute with 4# ankle weight, long arc quads with 4# ankle weight x 1 minute, L ankle AROM plantar flexion/dorsiflexion x 1 minute, L ankle ball circles CW/CCW x 1 minute;  All balance exercises performed in // bars without UE support: Feet together eyes closed x 30s; Tandem balance alternating forward LE 2 x 30s each; 6" alternating toe taps x 10 each; Semitandem balance alternating forward LE with forward foot on 6" step x 30s each;   Pt educated throughout session about proper posture and technique with exercises. Improved exercise technique, movement at target joints, use of target muscles after min to mod verbal, visual, tactile cues.      Patient demonstrates excellent motivation during session today. Session today focused strengthening and balance. Continued with mirror therapy to help with phantom limb pain.  She denies any dizziness throughout entirety of session. Patient encouraged to continue HEP and follow-up as scheduled. She is leaving town today to go down to [employer]  until early next week. She has her cardiac stress test scheduled for next Thursday. Will continue to progress dynamic balance, R single leg stability, and BLE strength. She will benefit from PT services to address deficits in strength, balance, and mobility in order to return to full function at home.                            PT Short Term Goals - 06/17/21 1103       PT SHORT TERM GOAL #1   Title Pt will be independent with HEP in order to improve strength and balance in order to decrease fall risk and improve function at home.    Time 6    Period Weeks    Status On-going    Target Date 06/01/21               PT Long Term Goals - 06/17/21 1103       PT LONG TERM GOAL #1   Title Pt will improve BERG to at least 52/56 in order to demonstrate clinically significant improvement in balance and decrease risk for falls;    Baseline 01/26/21: 45/56; 03/02/21:  47/56; 04/20/21: 49/56; 06/17/21: 49/56    Time 12    Period Weeks    Status Partially Met    Target Date 07/13/21      PT LONG TERM GOAL #2   Title Pt will improve ABC by at least 13% in order to demonstrate clinically significant improvement in balance confidence.    Baseline 01/26/21: 57.5%; 03/02/21: 89.4%    Time 8    Period Weeks    Status Achieved      PT LONG TERM GOAL #3   Title Pt will improve FOTO to at least 63 in order to demonstrate clinically significant improvement in function related to her RLE amputation    Baseline 01/26/21: 59; 03/02/21: 71; 04/06/21: 57; 06/17/21: 59    Time 8    Period Weeks    Status Partially Met    Target Date 07/13/21      PT LONG TERM GOAL #4   Title Pt will increase self-selected 10MWT to at least 0.6 m/s in order to demonstrate clinically significant improvement in community ambulation.    Baseline 01/26/21: 31.5s = 0.32 m/s; 03/02/21: 25.5s = 0.39 m/s; 04/06/21: 20.7s = 0.48 m/s; 06/17/21: 20.6s = 0.49 m/s    Time 8    Period Weeks    Status Partially Met    Target Date 07/13/21                   Plan - 06/29/21 1438     Clinical Impression Statement Patient demonstrates excellent motivation during session today. Session today focused strengthening and balance. Continued with mirror therapy to help with phantom limb pain.  She denies any dizziness throughout entirety of session. Patient encouraged to continue HEP and follow-up as scheduled. She is leaving town today to go down to [employer] until early next week. She has her cardiac stress test scheduled for next Thursday. Will continue to progress dynamic balance, R single leg stability, and BLE strength. She will benefit from PT services to address deficits in strength, balance, and mobility in order to return to full function at home.    Personal Factors and Comorbidities Age;Comorbidity 3+    Comorbidities Depression, osteoporosis, vascular disease    Examination-Activity Limitations  Bathing;Locomotion Level;Squat;Stairs;Stand;Transfers    Examination-Participation Restrictions Cleaning;Community Activity;Meal  Prep;Yard Work    Stability/Clinical Decision Making Unstable/Unpredictable    Rehab Potential Good    PT Frequency 2x / week    PT Duration 12 weeks    PT Treatment/Interventions ADLs/Self Care Home Management;Aquatic Therapy;Canalith Repostioning;Cryotherapy;Electrical Stimulation;Iontophoresis 62m/ml Dexamethasone;Moist Heat;Traction;Ultrasound;Gait training;Stair training;Therapeutic activities;Therapeutic exercise;Neuromuscular re-education;Manual techniques;Dry needling;Vestibular;Spinal Manipulations;Joint Manipulations    PT Next Visit Plan Strengthening, practice gait training with lofstrand crutches    PT Home Exercise Plan Access Code: [email or web address]    Consulted and Agree with Plan of Care Patient                 Patient will benefit from skilled therapeutic intervention in order to improve the following deficits and impairments:  Abnormal gait, Decreased strength, Difficulty walking  Visit Diagnosis: Muscle weakness (generalized)  Difficulty in walking, not elsewhere classified     Problem List Patient Active Problem List   Diagnosis Date Noted   Muscle spasm 09/09/2020   S/P AKA (above knee amputation), right ([place] 09/08/2020   Ischemia of extremity 07/02/2020   Chronic pain in right shoulder 11/25/2019   Encounter for long-term (current) use of high-risk medication 07/16/2019   GCA (giant cell arteritis) ([place] 07/16/2019   Temporal arteritis ([place] 07/13/2019   Postoperative wound infection 06/23/2019   Ischemic leg 05/31/2019   Atherosclerosis of native arteries of extremity with intermittent claudication ([place] 05/30/2019   Depression 05/29/2019   Eczema of lower extremity 03/04/2018   Chronic venous insufficiency 03/02/2018   Personal history of colonic polyps    Family history of colonic polyps    Benign neoplasm of ascending  colon    Mitral valve insufficiency 02/08/2017   Dyspnea on exertion 02/07/2017   Non-rheumatic mitral regurgitation 02/07/2017   Precordial pain 02/07/2017   CAD (coronary artery disease) 12/21/2016   Essential hypertension 12/21/2016   Compression fracture of lumbar spine, non-traumatic ([place] 04/26/2016   Compression fracture of L3 lumbar vertebra 04/20/2016   PVD (peripheral vascular disease) ([place] 02/24/2016   Family history of premature CAD 02/24/2016   Gastritis    Other specified diseases of esophagus    Hiatal hernia    Gastritis and gastroduodenitis    Ischemia of lower extremity    Arterial occlusion (H[facility])    Atherosclerosis of aorta (HDelta    History of smoking    Hyperlipidemia    Pain in the chest    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 02/05/2016   Carotid artery stenosis 01/04/2016   Vertigo, benign paroxysmal 12/28/2015   Clinical depression 09/06/2015   History of alcoholism ([place] 09/06/2015   Angiopathy, 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[place] 09/06/2015   Atherosclerosis of native arteries of extremity with rest pain ([place] 09/06/2015   Acute non-recurrent maxillary sinusitis 07/14/2015   Vaginal pruritus 05/26/2015   Chronic recurrent major depressive disorder ([facility] 03/09/2015   Osteoporosis, post-menopausal 03/09/2015   Peripheral blood vessel disorder ([place] 03/09/2015   Acid reflux 03/09/2015   GERD (gastroesophageal reflux disease) 12/21/2014   Carotid artery narrowing 01/28/2014   Peripheral arterial occlusive disease ([place] 06/08/2011   Occlusion and stenosis of unspecified carotid artery 06/08/2011   PAD (peripheral artery disease) ([place] 06/08/2011   JLyndel SafeHuprich PT, DPT, GCS  Huprich,Jason 06/29/2021, 9:22 PM  [facility] [place] [address]Phone: [phone]  Fax:  [phone] Name: HSERITA DEGROOTEMRN: [id number] of Birth: 2Feb 25, 1952

## 2021-07-06 ENCOUNTER — Ambulatory Visit: Payer: Medicare Other

## 2021-07-06 ENCOUNTER — Other Ambulatory Visit: Payer: Self-pay

## 2021-07-06 DIAGNOSIS — M6281 Muscle weakness (generalized): Secondary | ICD-10-CM

## 2021-07-06 DIAGNOSIS — R262 Difficulty in walking, not elsewhere classified: Secondary | ICD-10-CM

## 2021-07-06 DIAGNOSIS — R2681 Unsteadiness on feet: Secondary | ICD-10-CM

## 2021-07-06 NOTE — Therapy (Signed)
[facility] [address]. [place], [place], [address] Phone: [phone]   Fax:  [phone]  Physical Therapy Treatment  Patient Details  Name: Sara [name] MRN: [id number] Date of Birth: 04/24/1951 Referring Provider (PT): Dr. Delana Meyer   Encounter Date: 07/06/2021   PT End of Session - 07/06/21 1216     Visit Number 39    Number of Visits 63    Date for PT Re-Evaluation 07/13/21    Authorization Type eval: 01/26/21    PT Start Time 1015    PT Stop Time 1100    PT Time Calculation (min) 45 min    Equipment Utilized During Treatment Gait belt    Activity Tolerance Patient tolerated treatment well    Behavior During Therapy [facility] for tasks assessed/performed                 Past Medical History:  Diagnosis Date   Arthritis    right shoulder   Depression    Dyspnea    GERD (gastroesophageal reflux disease)    History of blood clots    Hyperlipidemia    Hypertension    Mild mitral regurgitation    Osteoporosis    Peripheral vascular disease ([place])    Stomach ulcer    Vascular disease    Sees Dr. Delana Meyer   Vertigo    Last episode approx Aug 2015   Wears dentures    full upper    Past Surgical History:  Procedure Laterality Date   ADENOIDECTOMY     AMPUTATION Right 08/11/2020   Procedure: AMPUTATION ABOVE KNEE;  Surgeon: [name], MD;  Location: [facility];  Service: Vascular;  Laterality: Right;   ARTERY BIOPSY Right 07/14/2019   Procedure: BIOPSY TEMPORAL ARTERY;  Surgeon: [name], MD;  Location: [facility];  Service: Vascular;  Laterality: Right;   BREAST CYST ASPIRATION Left    CARDIAC CATHETERIZATION  02/15/2017   [facility]   COLONOSCOPY WITH PROPOFOL N/A 10/22/2017   Procedure: COLONOSCOPY WITH PROPOFOL;  Surgeon: Lucilla Lame, MD;  Location: [place];  Service: Endoscopy;  Laterality: N/A;  specimens not taken--pt on Plavix will be brought back in after 7 days off med   COLONOSCOPY  WITH PROPOFOL N/A 11/05/2017   Procedure: COLONOSCOPY WITH PROPOFOL;  Surgeon: Lucilla Lame, MD;  Location: [place];  Service: Endoscopy;  Laterality: N/A;   ESOPHAGOGASTRODUODENOSCOPY N/A 12/21/2014   Procedure: ESOPHAGOGASTRODUODENOSCOPY (EGD);  Surgeon: Lucilla Lame, MD;  Location: [place];  Service: Gastroenterology;  Laterality: N/A;   ESOPHAGOGASTRODUODENOSCOPY (EGD) WITH PROPOFOL N/A 02/08/2016   Procedure: ESOPHAGOGASTRODUODENOSCOPY (EGD) WITH PROPOFOL;  Surgeon: Lucilla Lame, MD;  Location: [facility];  Service: Endoscopy;  Laterality: N/A;   FASCIOTOMY Right 05/30/2019   Procedure: FASCIOTOMY;  Surgeon: [name], MD;  Location: [facility];  Service: Vascular;  Laterality: Right;   FASCIOTOMY CLOSURE Right 06/04/2019   Procedure: FASCIOTOMY CLOSURE;  Surgeon: [name], MD;  Location: [facility];  Service: Vascular;  Laterality: Right;   HEMORROIDECTOMY  2014   LOWER EXTREMITY ANGIOGRAPHY Right 05/30/2019   Procedure: LOWER EXTREMITY ANGIOGRAPHY;  Surgeon: [name], MD;  Location: [place] [facility];  Service: Cardiovascular;  Laterality: Right;   LOWER EXTREMITY ANGIOGRAPHY Right 07/02/2020   Procedure: LOWER EXTREMITY ANGIOGRAPHY;  Surgeon: [name], MD;  Location: [place] [facility];  Service: Cardiovascular;  Laterality: Right;   PERIPHERAL VASCULAR CATHETERIZATION N/A 02/09/2016   Procedure: Abdominal Aortogram w/Lower  Extremity;  Surgeon: [name], MD;  Location: [place] [facility];  Service: Cardiovascular;  Laterality: N/A;   PERIPHERAL VASCULAR CATHETERIZATION Right 02/10/2016   Procedure: Lower Extremity Angiography;  Surgeon: [name], MD;  Location: [place] [facility];  Service: Cardiovascular;  Laterality: Right;   POLYPECTOMY  11/05/2017   Procedure: POLYPECTOMY;  Surgeon: Lucilla Lame, MD;  Location: [place];  Service: Endoscopy;;   SHOULDER ARTHROSCOPY WITH ROTATOR CUFF REPAIR AND  SUBACROMIAL DECOMPRESSION Right 02/27/2020   Procedure: RIGHT SHOULDER ARTHROSCOPY SUBACROMIAL DECOMPRESSION, DISTAL CLAVICLE EXCISION AND MINI-OPEN ROTATOR CUFF REPAIR;  Surgeon: Thornton Park, MD;  Location: [facility];  Service: Orthopedics;  Laterality: Right;   TONSILLECTOMY     VASCULAR SURGERY  0160,1093   Fem-Pop Bypass    There were no vitals filed for this visit.      Subjective Assessment - 07/06/21 1214     Subjective Pt reports that she is doing well today. Reports continued intermittent RLE phantom leg pain. No specific questions or concerns currently.    Pertinent History Pt has a history of RLE vascular issues since 1991. 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 She received her RLE prosthetic in April 2022 and has been receiving [facility] since her surgery. They discharged her yesterday to continue with OP PT. She continues with phantom RLE pain, especially at night and takes gabapentin with minimal relief. She has also been having R shoulder pain since the surgery which she attributes to using a wheelchair. She underwent R RTC surgery in September of 2021 and had physical therapy afterwards. She has been having issues with bilateral carpal tunnel and has a NCV study scheduled for tomorrow.    Limitations Walking    Patient Stated Goals Ambulate with her prosthetic    Currently in Pain? No/denies                TREATMENT   Ther-ex  NuStep L0-1 x 5 minutes BLE only for warm-up during interval history;  With prosthetic doffed: Supine R SLR hip flexion with manual resistance from therapist 2 x 20; Supine RLE bridge with residual RLE resting on stool (towel on top for cushion), arms at side, 3s hold, x 10, deferred additional set due to increase in distal RLE pain; Supine RLE long axis manually resisted external and internal rotation 2 x 20 each; Supine RLE adduction with manual resistance 2 x 20; L sidelying R hip abduction with  manual resistance 2 x 20; L sidelying R hip extension from 90 hip flexion to available end range extension with manual resistance 2 x 20; L sidelying R hip flexor stretch 3 x 60s; Prone R hip extension 2 x 10; Education about plan of care;   Pt educated throughout session about proper posture and technique with exercises. Improved exercise technique, movement at target joints, use of target muscles after min to mod verbal, visual, tactile cues.      Patient demonstrates excellent motivation during session today. Session today focused on RLE strengthening. Also spent time stretching R hip flexors. Discussed plans for upcoming discharge.  Patient encouraged to continue HEP and follow-up as scheduled. Will continue to progress dynamic balance, R single leg stability, and BLE strength. She will benefit from PT services to address deficits in strength, balance, and mobility in order to return to full function at home.  PT Short Term Goals - 06/17/21 1103       PT SHORT TERM GOAL #1   Title Pt will be independent with HEP in order to improve strength and balance in order to decrease fall risk and improve function at home.    Time 6    Period Weeks    Status On-going    Target Date 06/01/21               PT Long Term Goals - 06/17/21 1103       PT LONG TERM GOAL #1   Title Pt will improve BERG to at least 52/56 in order to demonstrate clinically significant improvement in balance and decrease risk for falls;    Baseline 01/26/21: 45/56; 03/02/21: 47/56; 04/20/21: 49/56; 06/17/21: 49/56    Time 12    Period Weeks    Status Partially Met    Target Date 07/13/21      PT LONG TERM GOAL #2   Title Pt will improve ABC by at least 13% in order to demonstrate clinically significant improvement in balance confidence.    Baseline 01/26/21: 57.5%; 03/02/21: 89.4%    Time 8    Period Weeks    Status Achieved      PT LONG TERM GOAL #3   Title Pt  will improve FOTO to at least 63 in order to demonstrate clinically significant improvement in function related to her RLE amputation    Baseline 01/26/21: 59; 03/02/21: 71; 04/06/21: 57; 06/17/21: 59    Time 8    Period Weeks    Status Partially Met    Target Date 07/13/21      PT LONG TERM GOAL #4   Title Pt will increase self-selected 10MWT to at least 0.6 m/s in order to demonstrate clinically significant improvement in community ambulation.    Baseline 01/26/21: 31.5s = 0.32 m/s; 03/02/21: 25.5s = 0.39 m/s; 04/06/21: 20.7s = 0.48 m/s; 06/17/21: 20.6s = 0.49 m/s    Time 8    Period Weeks    Status Partially Met    Target Date 07/13/21                   Plan - 07/06/21 1216     Clinical Impression Statement Patient demonstrates excellent motivation during session today. Session today focused on RLE strengthening. Also spent time stretching R hip flexors. Discussed plans for upcoming discharge.  Patient encouraged to continue HEP and follow-up as scheduled. Will continue to progress dynamic balance, R single leg stability, and BLE strength. She will benefit from PT services to address deficits in strength, balance, and mobility in order to return to full function at home.    Personal Factors and Comorbidities Age;Comorbidity 3+    Comorbidities Depression, osteoporosis, vascular disease    Examination-Activity Limitations Bathing;Locomotion Level;Squat;Stairs;Stand;Transfers    Examination-Participation Restrictions Cleaning;Community Activity;Meal Prep;Yard Work    Stability/Clinical Decision Making Unstable/Unpredictable    Rehab Potential Good    PT Frequency 2x / week    PT Duration 12 weeks    PT Treatment/Interventions ADLs/Self Care Home Management;Aquatic Therapy;Canalith Repostioning;Cryotherapy;Electrical Stimulation;Iontophoresis 61m/ml Dexamethasone;Moist Heat;Traction;Ultrasound;Gait training;Stair training;Therapeutic activities;Therapeutic exercise;Neuromuscular  re-education;Manual techniques;Dry needling;Vestibular;Spinal Manipulations;Joint Manipulations    PT Next Visit Plan Strengthening, practice gait training with lofstrand crutches    PT Home Exercise Plan Access Code: [email or web address]    Consulted and Agree with Plan of Care Patient                 Patient will benefit  from skilled therapeutic intervention in order to improve the following deficits and impairments:  Abnormal gait, Decreased strength, Difficulty walking  Visit Diagnosis: Muscle weakness (generalized)  Difficulty in walking, not elsewhere classified  Unsteadiness on feet     Problem List Patient Active Problem List   Diagnosis Date Noted   Muscle spasm 09/09/2020   S/P AKA (above knee amputation), right ([place]) 09/08/2020   Ischemia of extremity 07/02/2020   Chronic pain in right shoulder 11/25/2019   Encounter for long-term (current) use of high-risk medication 07/16/2019   GCA (giant cell arteritis) ([place]) 07/16/2019   Temporal arteritis ([place]) 07/13/2019   Postoperative wound infection 06/23/2019   Ischemic leg 05/31/2019   Atherosclerosis of native arteries of extremity with intermittent claudication ([place]) 05/30/2019   Depression 05/29/2019   Eczema of lower extremity 03/04/2018   Chronic venous insufficiency 03/02/2018   Personal history of colonic polyps    Family history of colonic polyps    Benign neoplasm of ascending colon    Mitral valve insufficiency 02/08/2017   Dyspnea on exertion 02/07/2017   Non-rheumatic mitral regurgitation 02/07/2017   Precordial pain 02/07/2017   CAD (coronary artery disease) 12/21/2016   Essential hypertension 12/21/2016   Compression fracture of lumbar spine, non-traumatic ([place]) 04/26/2016   Compression fracture of L3 lumbar vertebra 04/20/2016   PVD (peripheral vascular disease) ([place]) 02/24/2016   Family history of premature CAD 02/24/2016   Gastritis    Other specified diseases of esophagus    Hiatal hernia     Gastritis and gastroduodenitis    Ischemia of lower extremity    Arterial occlusion (H[facility])    Atherosclerosis of aorta ([place])    History of smoking    Hyperlipidemia    Pain in the chest    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 02/05/2016   Carotid artery stenosis 01/04/2016   Vertigo, benign paroxysmal 12/28/2015   Clinical depression 09/06/2015   History of alcoholism ([place]) 09/06/2015   Angiopathy, 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[place]) 09/06/2015   Atherosclerosis of native arteries of extremity with rest pain ([place]) 09/06/2015   Acute non-recurrent maxillary sinusitis 07/14/2015   Vaginal pruritus 05/26/2015   Chronic recurrent major depressive disorder ([place]) 03/09/2015   Osteoporosis, post-menopausal 03/09/2015   Peripheral blood vessel disorder ([place]) 03/09/2015   Acid reflux 03/09/2015   GERD (gastroesophageal reflux disease) 12/21/2014   Carotid artery narrowing 01/28/2014   Peripheral arterial occlusive disease ([place]) 06/08/2011   Occlusion and stenosis of unspecified carotid artery 06/08/2011   PAD (peripheral artery disease) ([place]) 06/08/2011   Lyndel Safe Huprich PT, DPT, GCS  Huprich,Jason 07/06/2021, 1:05 PM  [facility] [address] [place], [place], [address] Phone: [phone]   Fax:  [phone]  Name: Sara [name] MRN: [id number] Date of Birth: Jan 21, 1951

## 2021-07-07 ENCOUNTER — Encounter: Payer: Self-pay | Admitting: Gastroenterology

## 2021-07-07 ENCOUNTER — Ambulatory Visit (INDEPENDENT_AMBULATORY_CARE_PROVIDER_SITE_OTHER): Payer: Medicare Other | Admitting: Gastroenterology

## 2021-07-07 VITALS — BP 139/83 | HR 123 | Temp 97.6°F

## 2021-07-07 DIAGNOSIS — K219 Gastro-esophageal reflux disease without esophagitis: Secondary | ICD-10-CM | POA: Diagnosis not present

## 2021-07-07 DIAGNOSIS — I6523 Occlusion and stenosis of bilateral carotid arteries: Secondary | ICD-10-CM

## 2021-07-07 NOTE — Progress Notes (Signed)
Primary Care Physician: Danelle Berry, NP  Primary Gastroenterologist:  Dr. Lucilla Lame  Chief Complaint  Patient presents with   Follow-up   Gastroesophageal Reflux    Pt reports epigastric pain while on Pantoprazole.... PCP changed to Famotidine and Omeprazole... Pt reports pain has subsided, but still having burning sensation    HPI: Sara [name] is a 70 y.o. female here for follow-up after seeing me in the past for a colonoscopy in 2019 with a recommendation to have a repeat colonoscopy in 5 years.  The patient also saw me last year because of epigastric discomfort and weight loss that she attributed to the steroids she was taking for temporal arteritis.  She was put on pantoprazole twice a day at that time to see if that would help her symptoms.  She reports burning in the epigastric area at night.  She reports that when she switched from pantoprazole to omeprazole her pain went away but she still has burning mostly just left of the epigastric area.  She reports that the burning is mostly in the evening and she says it is helped by taking Tums.  There is no report of any unexplained weight loss black stools or bloody stools.  She also denies any nausea vomiting.  Past Medical History:  Diagnosis Date   Arthritis    right shoulder   Depression    Dyspnea    GERD (gastroesophageal reflux disease)    History of blood clots    Hyperlipidemia    Hypertension    Mild mitral regurgitation    Osteoporosis    Peripheral vascular disease ([place])    Stomach ulcer    Vascular disease    Sees Dr. Delana Meyer   Vertigo    Last episode approx Aug 2015   Wears dentures    full upper    Current Outpatient Medications  Medication Sig Dispense Refill   apixaban (ELIQUIS) 5 MG TABS tablet Take 1 tablet (5 mg total) by mouth 2 (two) times daily. 8 tablet 0   atorvastatin (LIPITOR) 40 MG tablet Take 40 mg by mouth at bedtime.      calcium carbonate (OSCAL) 1500 (600 Ca) MG TABS tablet  Take 1,200 mg of elemental calcium by mouth 2 (two) times daily with a meal.     cholecalciferol (VITAMIN D3) 25 MCG (1000 UNIT) tablet Take 1,000 Units by mouth daily.     clobetasol cream (TEMOVATE) 5.36 % Apply 1 application topically daily as needed (itching).     diltiazem (CARDIZEM CD) 240 MG 24 hr capsule Take 240 mg by mouth daily.     DULoxetine (CYMBALTA) 60 MG capsule Take 60 mg by mouth every morning.     gabapentin (NEURONTIN) 300 MG capsule TAKE 1 CAPSULE BY MOUTH THREE TIMES A DAY 90 capsule 3   ibandronate (BONIVA) 150 MG tablet Take 150 mg by mouth every 30 (thirty) days.      losartan (COZAAR) 25 MG tablet Take 25 mg by mouth every morning.      omeprazole (PRILOSEC) 20 MG capsule Take 20 mg by mouth daily.     oxyCODONE-acetaminophen (PERCOCET/ROXICET) 5-325 MG tablet Take 1-2 tablets by mouth every 4 (four) hours as needed for moderate pain. 30 tablet 0   Tocilizumab (ACTEMRA ACTPEN) 162 MG/0.9ML SOAJ Inject 162 mg into the skin once a week.      zolpidem (AMBIEN) 5 MG tablet Take 5 mg by mouth at bedtime as needed for sleep.  famotidine (PEPCID) 40 MG tablet SMARTSIG:1 Tablet(s) By Mouth Every Evening     No current facility-administered medications for this visit.    Allergies as of 07/07/2021 - Review Complete 07/07/2021  Allergen Reaction Noted   Hydrocodone-acetaminophen  05/29/2019   Amoxicillin Other (See Comments) 03/09/2015   Metoprolol tartrate Rash 07/08/2019   Oxycodone Itching 07/10/2019   Vicodin [hydrocodone-acetaminophen] Hives and Rash 12/18/2014    ROS:  General: Negative for anorexia, weight loss, fever, chills, fatigue, weakness. ENT: Negative for hoarseness, difficulty swallowing , nasal congestion. CV: Negative for chest pain, angina, palpitations, dyspnea on exertion, peripheral edema.  Respiratory: Negative for dyspnea at rest, dyspnea on exertion, cough, sputum, wheezing.  GI: See history of present illness. GU:  Negative for dysuria,  hematuria, urinary incontinence, urinary frequency, nocturnal urination.  Endo: Negative for unusual weight change.    Physical Examination:   BP 139/83   Pulse (!) 123   Temp 97.6 F (36.4 C) (Oral)   General: Well-nourished, well-developed in no acute distress.  Eyes: No icterus. Conjunctivae pink. Abdomen: Bowel sounds are normal, nontender, nondistended, no hepatosplenomegaly or masses, no abdominal bruits or hernia , no rebound or guarding.   Extremities: No left lower extremity edema. No clubbing or deformities. Right AKA Neuro: Alert and oriented x 3.  Grossly intact. Skin: Warm and dry, no jaundice.   Psych: Alert and cooperative, normal mood and affect.  Labs:    Imaging Studies: MR BRAIN WO CONTRAST  Result Date: 06/24/2021 CLINICAL DATA:  Dizziness upon standing EXAM: MRI HEAD WITHOUT CONTRAST TECHNIQUE: Multiplanar, multiecho pulse sequences of the brain and surrounding structures were obtained without intravenous contrast. COMPARISON:  None. FINDINGS: Brain: No restricted diffusion to suggest acute infarct. No acute hemorrhage, mass, mass effect, or midline shift. No foci of hemosiderin deposition to suggest remote hemorrhage. T2 hyperintense signal in the periventricular white matter, likely the sequela of mild chronic small vessel ischemic disease. Vascular: Normal flow voids. Skull and upper cervical spine: Normal marrow signal. Sinuses/Orbits: Negative.  Status post bilateral lens replacements. Other: Trace fluid in left mastoid air cells. IMPRESSION: No acute intracranial process. Electronically Signed   By: Merilyn Baba M.D.   On: 06/24/2021 19:58    Assessment and Plan:   Sara [name] is a 69 y.o. y/o female who comes in today with a history of abdominal burning while she is taking omeprazole and Pepcid with supplementing it with Tums.  The patient will try to switch her omeprazole to the evening and see if that helps her symptoms.  If the burning returns during the  day then she may need to be on the omeprazole twice a day.  If this does not help her she may need to undergo an EGD.  The patient has been explained the plan and agrees with it.     Lucilla Lame, MD. Marval Regal    Note: This dictation was prepared with Dragon dictation along with smaller phrase technology. Any transcriptional errors that result from this process are unintentional.

## 2021-07-08 ENCOUNTER — Ambulatory Visit: Payer: Medicare Other

## 2021-07-08 ENCOUNTER — Other Ambulatory Visit: Payer: Self-pay

## 2021-07-08 DIAGNOSIS — M6281 Muscle weakness (generalized): Secondary | ICD-10-CM

## 2021-07-08 DIAGNOSIS — R262 Difficulty in walking, not elsewhere classified: Secondary | ICD-10-CM

## 2021-07-08 NOTE — Therapy (Signed)
[facility] [address]. [place], [place], [address] Phone: [phone]   Fax:  [phone]  Physical Therapy Treatment  Patient Details  Name: Sara [name] MRN: [id number] Date of Birth: 1950/11/24 Referring Provider (PT): Dr. Delana Meyer   Encounter Date: 07/08/2021   PT End of Session - 07/08/21 1219     Visit Number 61    Number of Visits 15    Date for PT Re-Evaluation 07/13/21    Authorization Type eval: 01/26/21    PT Start Time 1015    PT Stop Time 1100    PT Time Calculation (min) 45 min    Equipment Utilized During Treatment Gait belt    Activity Tolerance Patient tolerated treatment well    Behavior During Therapy [facility] for tasks assessed/performed                 Past Medical History:  Diagnosis Date   Arthritis    right shoulder   Depression    Dyspnea    GERD (gastroesophageal reflux disease)    History of blood clots    Hyperlipidemia    Hypertension    Mild mitral regurgitation    Osteoporosis    Peripheral vascular disease ([place])    Stomach ulcer    Vascular disease    Sees Dr. Delana Meyer   Vertigo    Last episode approx Aug 2015   Wears dentures    full upper    Past Surgical History:  Procedure Laterality Date   ADENOIDECTOMY     AMPUTATION Right 08/11/2020   Procedure: AMPUTATION ABOVE KNEE;  Surgeon: [name], MD;  Location: [facility];  Service: Vascular;  Laterality: Right;   ARTERY BIOPSY Right 07/14/2019   Procedure: BIOPSY TEMPORAL ARTERY;  Surgeon: [name], MD;  Location: [facility];  Service: Vascular;  Laterality: Right;   BREAST CYST ASPIRATION Left    CARDIAC CATHETERIZATION  02/15/2017   [facility]   COLONOSCOPY WITH PROPOFOL N/A 10/22/2017   Procedure: COLONOSCOPY WITH PROPOFOL;  Surgeon: Lucilla Lame, MD;  Location: [place];  Service: Endoscopy;  Laterality: N/A;  specimens not taken--pt on Plavix will be brought back in after 7 days off med   COLONOSCOPY  WITH PROPOFOL N/A 11/05/2017   Procedure: COLONOSCOPY WITH PROPOFOL;  Surgeon: Lucilla Lame, MD;  Location: [place];  Service: Endoscopy;  Laterality: N/A;   ESOPHAGOGASTRODUODENOSCOPY N/A 12/21/2014   Procedure: ESOPHAGOGASTRODUODENOSCOPY (EGD);  Surgeon: Lucilla Lame, MD;  Location: [place];  Service: Gastroenterology;  Laterality: N/A;   ESOPHAGOGASTRODUODENOSCOPY (EGD) WITH PROPOFOL N/A 02/08/2016   Procedure: ESOPHAGOGASTRODUODENOSCOPY (EGD) WITH PROPOFOL;  Surgeon: Lucilla Lame, MD;  Location: [facility];  Service: Endoscopy;  Laterality: N/A;   FASCIOTOMY Right 05/30/2019   Procedure: FASCIOTOMY;  Surgeon: [name], MD;  Location: [facility];  Service: Vascular;  Laterality: Right;   FASCIOTOMY CLOSURE Right 06/04/2019   Procedure: FASCIOTOMY CLOSURE;  Surgeon: [name], MD;  Location: [facility];  Service: Vascular;  Laterality: Right;   HEMORROIDECTOMY  2014   LOWER EXTREMITY ANGIOGRAPHY Right 05/30/2019   Procedure: LOWER EXTREMITY ANGIOGRAPHY;  Surgeon: [name], MD;  Location: [place] [facility];  Service: Cardiovascular;  Laterality: Right;   LOWER EXTREMITY ANGIOGRAPHY Right 07/02/2020   Procedure: LOWER EXTREMITY ANGIOGRAPHY;  Surgeon: [name], MD;  Location: [place] [facility];  Service: Cardiovascular;  Laterality: Right;   PERIPHERAL VASCULAR CATHETERIZATION N/A 02/09/2016   Procedure: Abdominal Aortogram w/Lower  Extremity;  Surgeon: [name], MD;  Location: [place] [facility];  Service: Cardiovascular;  Laterality: N/A;   PERIPHERAL VASCULAR CATHETERIZATION Right 02/10/2016   Procedure: Lower Extremity Angiography;  Surgeon: [name], MD;  Location: [place] [facility];  Service: Cardiovascular;  Laterality: Right;   POLYPECTOMY  11/05/2017   Procedure: POLYPECTOMY;  Surgeon: Lucilla Lame, MD;  Location: [place];  Service: Endoscopy;;   SHOULDER ARTHROSCOPY WITH ROTATOR CUFF REPAIR AND  SUBACROMIAL DECOMPRESSION Right 02/27/2020   Procedure: RIGHT SHOULDER ARTHROSCOPY SUBACROMIAL DECOMPRESSION, DISTAL CLAVICLE EXCISION AND MINI-OPEN ROTATOR CUFF REPAIR;  Surgeon: Thornton Park, MD;  Location: [facility];  Service: Orthopedics;  Laterality: Right;   TONSILLECTOMY     VASCULAR SURGERY  [id number]   Fem-Pop Bypass    There were no vitals filed for this visit.      Subjective Assessment - 07/08/21 1021     Subjective Pt reports that she is doing well today. No resting leg pain upon arrival. Reports continued intermittent RLE phantom leg pain. She saw GI yesterday who adjustied her medicine. She also had her cardiac testing yesterday and is currently wearing a holter monitor. No specific questions or concerns currently.    Pertinent History Pt has a history of RLE vascular issues since 1991. 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 She received her RLE prosthetic in April 2022 and has been receiving [facility] since her surgery. They discharged her yesterday to continue with OP PT. She continues with phantom RLE pain, especially at night and takes gabapentin with minimal relief. She has also been having R shoulder pain since the surgery which she attributes to using a wheelchair. She underwent R RTC surgery in September of 2021 and had physical therapy afterwards. She has been having issues with bilateral carpal tunnel and has a NCV study scheduled for tomorrow.    Limitations Walking    Patient Stated Goals Ambulate with her prosthetic    Currently in Pain? No/denies                TREATMENT   Neuromuscular Re-education  NuStep L1-3 x 5 minutes BLE only for warm-up during interval history; Side stepping with 3# ankle weight (AW) around ankles in // bars x 4 lengths; Forward/backward walking with 4# AW around ankles in // bars x 4 lengths; Obstacle course in // bars with 3" step-over, 6" step up/down and 6" hurdle x multiple  bouts; Mirror therapy performed with patient sitting at edge of mat table and mirror between legs to simulate right lower extremity.  While watching the mirror with LLE patient performed seated marches x 1 minute with 4# ankle weight, long arc quads with 4# ankle weight x 1 minute, L ankle AROM plantar flexion/dorsiflexion x 1 minute, L ankle ball circles CW/CCW x 1 minute; Education about discharge;   Pt educated throughout session about proper posture and technique with exercises. Improved exercise technique, movement at target joints, use of target muscles after min to mod verbal, visual, tactile cues.      Patient demonstrates excellent motivation during session today. Session today focused on balance. Continued with mirror therapy to help with phantom limb pain.  Patient encouraged to continue HEP and follow-up as scheduled. Will update outcome measures/goals and discharge at next visit. She will benefit from PT services to address deficits in strength, balance, and mobility in order to return to full function at home.  PT Short Term Goals - 06/17/21 1103       PT SHORT TERM GOAL #1   Title Pt will be independent with HEP in order to improve strength and balance in order to decrease fall risk and improve function at home.    Time 6    Period Weeks    Status On-going    Target Date 06/01/21               PT Long Term Goals - 06/17/21 1103       PT LONG TERM GOAL #1   Title Pt will improve BERG to at least 52/56 in order to demonstrate clinically significant improvement in balance and decrease risk for falls;    Baseline 01/26/21: 45/56; 03/02/21: 47/56; 04/20/21: 49/56; 06/17/21: 49/56    Time 12    Period Weeks    Status Partially Met    Target Date 07/13/21      PT LONG TERM GOAL #2   Title Pt will improve ABC by at least 13% in order to demonstrate clinically significant improvement in balance confidence.    Baseline 01/26/21:  57.5%; 03/02/21: 89.4%    Time 8    Period Weeks    Status Achieved      PT LONG TERM GOAL #3   Title Pt will improve FOTO to at least 63 in order to demonstrate clinically significant improvement in function related to her RLE amputation    Baseline 01/26/21: 59; 03/02/21: 71; 04/06/21: 57; 06/17/21: 59    Time 8    Period Weeks    Status Partially Met    Target Date 07/13/21      PT LONG TERM GOAL #4   Title Pt will increase self-selected 10MWT to at least 0.6 m/s in order to demonstrate clinically significant improvement in community ambulation.    Baseline 01/26/21: 31.5s = 0.32 m/s; 03/02/21: 25.5s = 0.39 m/s; 04/06/21: 20.7s = 0.48 m/s; 06/17/21: 20.6s = 0.49 m/s    Time 8    Period Weeks    Status Partially Met    Target Date 07/13/21                   Plan - 07/08/21 1219     Clinical Impression Statement Patient demonstrates excellent motivation during session today. Session today focused on balance. Continued with mirror therapy to help with phantom limb pain.  Patient encouraged to continue HEP and follow-up as scheduled. Will update outcome measures/goals and discharge at next visit. She will benefit from PT services to address deficits in strength, balance, and mobility in order to return to full function at home.    Personal Factors and Comorbidities Age;Comorbidity 3+    Comorbidities Depression, osteoporosis, vascular disease    Examination-Activity Limitations Bathing;Locomotion Level;Squat;Stairs;Stand;Transfers    Examination-Participation Restrictions Cleaning;Community Activity;Meal Prep;Yard Work    Stability/Clinical Decision Making Unstable/Unpredictable    Rehab Potential Good    PT Frequency 2x / week    PT Duration 12 weeks    PT Treatment/Interventions ADLs/Self Care Home Management;Aquatic Therapy;Canalith Repostioning;Cryotherapy;Electrical Stimulation;Iontophoresis 33m/ml Dexamethasone;Moist Heat;Traction;Ultrasound;Gait training;Stair  training;Therapeutic activities;Therapeutic exercise;Neuromuscular re-education;Manual techniques;Dry needling;Vestibular;Spinal Manipulations;Joint Manipulations    PT Next Visit Plan Update outcome measures/goals, discharge    PT Home Exercise Plan Access Code: [email or web address]    Consulted and Agree with Plan of Care Patient                 Patient will benefit from skilled therapeutic intervention in order to improve the following  deficits and impairments:  Abnormal gait, Decreased strength, Difficulty walking  Visit Diagnosis: Muscle weakness (generalized)  Difficulty in walking, not elsewhere classified     Problem List Patient Active Problem List   Diagnosis Date Noted   Muscle spasm 09/09/2020   S/P AKA (above knee amputation), right ([place]) 09/08/2020   Ischemia of extremity 07/02/2020   Chronic pain in right shoulder 11/25/2019   Encounter for long-term (current) use of high-risk medication 07/16/2019   GCA (giant cell arteritis) ([place]) 07/16/2019   Temporal arteritis ([place]) 07/13/2019   Postoperative wound infection 06/23/2019   Ischemic leg 05/31/2019   Atherosclerosis of native arteries of extremity with intermittent claudication ([place]) 05/30/2019   Depression 05/29/2019   Eczema of lower extremity 03/04/2018   Chronic venous insufficiency 03/02/2018   Personal history of colonic polyps    Family history of colonic polyps    Benign neoplasm of ascending colon    Mitral valve insufficiency 02/08/2017   Dyspnea on exertion 02/07/2017   Non-rheumatic mitral regurgitation 02/07/2017   Precordial pain 02/07/2017   CAD (coronary artery disease) 12/21/2016   Essential hypertension 12/21/2016   Compression fracture of lumbar spine, non-traumatic ([place]) 04/26/2016   Compression fracture of L3 lumbar vertebra 04/20/2016   PVD (peripheral vascular disease) ([place]) 02/24/2016   Family history of premature CAD 02/24/2016   Gastritis    Other specified diseases of esophagus     Hiatal hernia    Gastritis and gastroduodenitis    Ischemia of lower extremity    Arterial occlusion ([facility])    Atherosclerosis of aorta ([place])    History of smoking    Hyperlipidemia    Pain in the chest    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 02/05/2016   Carotid artery stenosis 01/04/2016   Vertigo, benign paroxysmal 12/28/2015   Clinical depression 09/06/2015   History of alcoholism ([place]) 09/06/2015   Angiopathy, 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[place]) 09/06/2015   Atherosclerosis of native arteries of extremity with rest pain ([place]) 09/06/2015   Acute non-recurrent maxillary sinusitis 07/14/2015   Vaginal pruritus 05/26/2015   Chronic recurrent major depressive disorder ([place]) 03/09/2015   Osteoporosis, post-menopausal 03/09/2015   Peripheral blood vessel disorder ([place]) 03/09/2015   Acid reflux 03/09/2015   GERD (gastroesophageal reflux disease) 12/21/2014   Carotid artery narrowing 01/28/2014   Peripheral arterial occlusive disease ([place]) 06/08/2011   Occlusion and stenosis of unspecified carotid artery 06/08/2011   PAD (peripheral artery disease) ([place]) 06/08/2011   Lyndel Safe Evander Macaraeg PT, DPT, GCS  Pansie Guggisberg 07/08/2021, 1:29 PM  [place] [facility] [address] [place], [place], [address] Phone: [phone]   Fax:  [phone]  Name: Sara [name] MRN: [id number] Date of Birth: [birth year]

## 2021-07-11 ENCOUNTER — Ambulatory Visit: Payer: Medicare Other

## 2021-07-11 ENCOUNTER — Other Ambulatory Visit: Payer: Self-pay

## 2021-07-11 DIAGNOSIS — M6281 Muscle weakness (generalized): Secondary | ICD-10-CM

## 2021-07-11 DIAGNOSIS — R262 Difficulty in walking, not elsewhere classified: Secondary | ICD-10-CM

## 2021-07-11 DIAGNOSIS — R2681 Unsteadiness on feet: Secondary | ICD-10-CM

## 2021-07-11 NOTE — Therapy (Signed)
[facility] [address] [place], [place], [address] Phone: [phone]   Fax:  [phone]  Physical Therapy Treatment/Discharge  Patient Details  Name: Sara [name] MRN: [id number] Date of Birth: November 06, 1950 Referring Provider (PT): Dr. Delana Meyer   Encounter Date: 07/11/2021   PT End of Session - 07/11/21 1032     Visit Number 48    Number of Visits 98    Date for PT Re-Evaluation 07/13/21    Authorization Type eval: 01/26/21    PT Start Time 1020    PT Stop Time 1100    PT Time Calculation (min) 40 min    Equipment Utilized During Treatment Gait belt    Activity Tolerance Patient tolerated treatment well    Behavior During Therapy [facility] for tasks assessed/performed                  Past Medical History:  Diagnosis Date   Arthritis    right shoulder   Depression    Dyspnea    GERD (gastroesophageal reflux disease)    History of blood clots    Hyperlipidemia    Hypertension    Mild mitral regurgitation    Osteoporosis    Peripheral vascular disease ([place])    Stomach ulcer    Vascular disease    Sees Dr. Delana Meyer   Vertigo    Last episode approx Aug 2015   Wears dentures    full upper    Past Surgical History:  Procedure Laterality Date   ADENOIDECTOMY     AMPUTATION Right 08/11/2020   Procedure: AMPUTATION ABOVE KNEE;  Surgeon: [name], MD;  Location: [facility];  Service: Vascular;  Laterality: Right;   ARTERY BIOPSY Right 07/14/2019   Procedure: BIOPSY TEMPORAL ARTERY;  Surgeon: [name], MD;  Location: [facility];  Service: Vascular;  Laterality: Right;   BREAST CYST ASPIRATION Left    CARDIAC CATHETERIZATION  02/15/2017   [facility]   COLONOSCOPY WITH PROPOFOL N/A 10/22/2017   Procedure: COLONOSCOPY WITH PROPOFOL;  Surgeon: Lucilla Lame, MD;  Location: [place];  Service: Endoscopy;  Laterality: N/A;  specimens not taken--pt on Plavix will be brought back in after 7 days off med    COLONOSCOPY WITH PROPOFOL N/A 11/05/2017   Procedure: COLONOSCOPY WITH PROPOFOL;  Surgeon: Lucilla Lame, MD;  Location: [place];  Service: Endoscopy;  Laterality: N/A;   ESOPHAGOGASTRODUODENOSCOPY N/A 12/21/2014   Procedure: ESOPHAGOGASTRODUODENOSCOPY (EGD);  Surgeon: Lucilla Lame, MD;  Location: [place];  Service: Gastroenterology;  Laterality: N/A;   ESOPHAGOGASTRODUODENOSCOPY (EGD) WITH PROPOFOL N/A 02/08/2016   Procedure: ESOPHAGOGASTRODUODENOSCOPY (EGD) WITH PROPOFOL;  Surgeon: Lucilla Lame, MD;  Location: [facility];  Service: Endoscopy;  Laterality: N/A;   FASCIOTOMY Right 05/30/2019   Procedure: FASCIOTOMY;  Surgeon: [name], MD;  Location: [facility];  Service: Vascular;  Laterality: Right;   FASCIOTOMY CLOSURE Right 06/04/2019   Procedure: FASCIOTOMY CLOSURE;  Surgeon: [name], MD;  Location: [facility];  Service: Vascular;  Laterality: Right;   HEMORROIDECTOMY  2014   LOWER EXTREMITY ANGIOGRAPHY Right 05/30/2019   Procedure: LOWER EXTREMITY ANGIOGRAPHY;  Surgeon: [name], MD;  Location: [place] [facility];  Service: Cardiovascular;  Laterality: Right;   LOWER EXTREMITY ANGIOGRAPHY Right 07/02/2020   Procedure: LOWER EXTREMITY ANGIOGRAPHY;  Surgeon: [name], MD;  Location: [place] [facility];  Service: Cardiovascular;  Laterality: Right;   PERIPHERAL VASCULAR CATHETERIZATION N/A 02/09/2016   Procedure: Abdominal Aortogram  w/Lower Extremity;  Surgeon: [name], MD;  Location: [place] [facility];  Service: Cardiovascular;  Laterality: N/A;   PERIPHERAL VASCULAR CATHETERIZATION Right 02/10/2016   Procedure: Lower Extremity Angiography;  Surgeon: [name], MD;  Location: [place] [facility];  Service: Cardiovascular;  Laterality: Right;   POLYPECTOMY  11/05/2017   Procedure: POLYPECTOMY;  Surgeon: Lucilla Lame, MD;  Location: [place];  Service: Endoscopy;;   SHOULDER ARTHROSCOPY WITH ROTATOR CUFF  REPAIR AND SUBACROMIAL DECOMPRESSION Right 02/27/2020   Procedure: RIGHT SHOULDER ARTHROSCOPY SUBACROMIAL DECOMPRESSION, DISTAL CLAVICLE EXCISION AND MINI-OPEN ROTATOR CUFF REPAIR;  Surgeon: Thornton Park, MD;  Location: [facility];  Service: Orthopedics;  Laterality: Right;   TONSILLECTOMY     VASCULAR SURGERY  0600,4599   Fem-Pop Bypass    There were no vitals filed for this visit.      Subjective Assessment - 07/11/21 1744     Subjective Pt reports that she is doing well today. No resting leg pain upon arrival. Reports continued intermittent RLE phantom leg pain. No specific questions or concerns currently. She is ready for discharge.    Pertinent History Pt has a history of RLE vascular issues since 1991. 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 She received her RLE prosthetic in April 2022 and has been receiving [facility] since her surgery. They discharged her yesterday to continue with OP PT. She continues with phantom RLE pain, especially at night and takes gabapentin with minimal relief. She has also been having R shoulder pain since the surgery which she attributes to using a wheelchair. She underwent R RTC surgery in September of 2021 and had physical therapy afterwards. She has been having issues with bilateral carpal tunnel and has a NCV study scheduled for tomorrow.    Limitations Walking    Patient Stated Goals Ambulate with her prosthetic    Currently in Pain? No/denies                 TREATMENT   Neuromuscular Re-education  NuStep L1-3 x 5 minutes BLE only for warm-up during interval history;  Updated outcome measures and goals with patient: FOTO: 72  ABC: 97.5% BERG: 51/56 Self-selected 21mgait speed: 22.7s = 0.44 m/s;  Side stepping with 4# ankle weight (AW) around ankles in // bars x 4 lengths; Forward/backward walking with 4# AW around ankles in // bars x 4 lengths; HEP reviewed with discharge instructions;       Patient is ready for discharge today. Updated additional outcome measures with patient today. BERG increased from 45/56 at initial evaluation to 51/56 today. Her ABC score improved from 57.5% at initial evaluation to 97.5% today. Her 187mait speed improved from 0.32 m/s to 0.44 m/s today. Overall pt has made excellent progress since starting with therapy. Pt will be discharged on this date with instructions to follow up for a few additional sessions once she gets her vacuum socket.                           PT Short Term Goals - 07/11/21 1746       PT SHORT TERM GOAL #1   Title Pt will be independent with HEP in order to improve strength and balance in order to decrease fall risk and improve function at home.    Time 6    Period Weeks    Status Achieved  PT Long Term Goals - 07/11/21 1746       PT LONG TERM GOAL #1   Title Pt will improve BERG to at least 52/56 in order to demonstrate clinically significant improvement in balance and decrease risk for falls;    Baseline 01/26/21: 45/56; 03/02/21: 47/56; 04/20/21: 49/56; 06/17/21: 49/56; 07/11/21: 51/56    Time 12    Period Weeks    Status Partially Met      PT LONG TERM GOAL #2   Title Pt will improve ABC by at least 13% in order to demonstrate clinically significant improvement in balance confidence.    Baseline 01/26/21: 57.5%; 03/02/21: 89.4%; 07/11/21: 97.5%    Time 8    Period Weeks    Status Achieved      PT LONG TERM GOAL #3   Title Pt will improve FOTO to at least 63 in order to demonstrate clinically significant improvement in function related to her RLE amputation    Baseline 01/26/21: 59; 03/02/21: 71; 04/06/21: 57; 06/17/21: 59; 07/11/21: 72    Time 8    Period Weeks    Status Partially Met      PT LONG TERM GOAL #4   Title Pt will increase self-selected 10MWT to at least 0.6 m/s in order to demonstrate clinically significant improvement in community ambulation.    Baseline 01/26/21:  31.5s = 0.32 m/s; 03/02/21: 25.5s = 0.39 m/s; 04/06/21: 20.7s = 0.48 m/s; 06/17/21: 20.6s = 0.49 m/s; 07/11/21: 0.44 m/s    Time 8    Period Weeks    Status Partially Met                   Plan - 07/11/21 1046     Clinical Impression Statement Patient is ready for discharge today. Updated additional outcome measures with patient today. BERG increased from 45/56 at initial evaluation to 51/56 today. Her ABC score improved from 57.5% at initial evaluation to 97.5% today. Her 26mgait speed improved from 0.32 m/s to 0.44 m/s today. Overall pt has made excellent progress since starting with therapy. Pt will be discharged on this date with instructions to follow up for a few additional sessions once she gets her vacuum socket. ?    Personal Factors and Comorbidities Age;Comorbidity 3+    Comorbidities Depression, osteoporosis, vascular disease    Examination-Activity Limitations Bathing;Locomotion Level;Squat;Stairs;Stand;Transfers    Examination-Participation Restrictions Cleaning;Community Activity;Meal Prep;Yard Work    Stability/Clinical Decision Making Unstable/Unpredictable    Rehab Potential Good    PT Frequency 2x / week    PT Duration 12 weeks    PT Treatment/Interventions ADLs/Self Care Home Management;Aquatic Therapy;Canalith Repostioning;Cryotherapy;Electrical Stimulation;Iontophoresis 440mml Dexamethasone;Moist Heat;Traction;Ultrasound;Gait training;Stair training;Therapeutic activities;Therapeutic exercise;Neuromuscular re-education;Manual techniques;Dry needling;Vestibular;Spinal Manipulations;Joint Manipulations    PT Next Visit Plan Discharge    PT Home Exercise Plan Access Code: [email or web address]    Consulted and Agree with Plan of Care Patient                  Patient will benefit from skilled therapeutic intervention in order to improve the following deficits and impairments:  Abnormal gait, Decreased strength, Difficulty walking  Visit Diagnosis: Muscle weakness  (generalized)  Difficulty in walking, not elsewhere classified  Unsteadiness on feet     Problem List Patient Active Problem List   Diagnosis Date Noted   Muscle spasm 09/09/2020   S/P AKA ([place]   Ischemia of extremity 07/02/2020   Chronic pain in right shoulder 11/25/2019   Encounter  for long-term (current) use of high-risk medication 07/16/2019   GCA (giant cell arteritis) ([place]) 07/16/2019   Temporal arteritis ([place]) 07/13/2019   Postoperative wound infection 06/23/2019   Ischemic leg 05/31/2019   Atherosclerosis of native arteries of extremity with intermittent claudication ([place]) 05/30/2019   Depression 05/29/2019   Eczema of lower extremity 03/04/2018   Chronic venous insufficiency 03/02/2018   Personal history of colonic polyps    Family history of colonic polyps    Benign neoplasm of ascending colon    Mitral valve insufficiency 02/08/2017   Dyspnea on exertion 02/07/2017   Non-rheumatic mitral regurgitation 02/07/2017   Precordial pain 02/07/2017   CAD (coronary artery disease) 12/21/2016   Essential hypertension 12/21/2016   Compression fracture of lumbar spine, non-traumatic ([place]) 04/26/2016   Compression fracture of L3 lumbar vertebra 04/20/2016   PVD (peripheral vascular disease) ([place]) 02/24/2016   Family history of premature CAD 02/24/2016   Gastritis    Other specified diseases of esophagus    Hiatal hernia    Gastritis and gastroduodenitis    Ischemia of lower extremity    Arterial occlusion (HCC)    Atherosclerosis of aorta ([place])    History of smoking    Hyperlipidemia    Pain in the chest    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 02/05/2016   Carotid artery stenosis 01/04/2016   Vertigo, benign paroxysmal 12/28/2015   Clinical depression 09/06/2015   History of alcoholism ([place]) 09/06/2015   Angiopathy, 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[place]) 09/06/2015   Atherosclerosis of native arteries of extremity with  rest pain ([place]) 09/06/2015   Acute non-recurrent maxillary sinusitis 07/14/2015   Vaginal pruritus 05/26/2015   Chronic recurrent major depressive disorder ([place]) 03/09/2015   Osteoporosis, post-menopausal 03/09/2015   Peripheral blood vessel disorder ([place]) 03/09/2015   Acid reflux 03/09/2015   GERD (gastroesophageal reflux disease) 12/21/2014   Carotid artery narrowing 01/28/2014   Peripheral arterial occlusive disease ([place]) 06/08/2011   Occlusion and stenosis of unspecified carotid artery 06/08/2011   PAD (peripheral artery disease) ([place]) 06/08/2011   Lyndel Safe Lashonta Pilling PT, DPT, GCS  Scout Gumbs 07/11/2021, 5:59 PM  [facility] [address] [place], [place], [address] Phone: [phone]   Fax:  [phone]  Name: Sara [name] MRN: [id number] Date of Birth: 07-05-1951

## 2021-07-18 ENCOUNTER — Telehealth (INDEPENDENT_AMBULATORY_CARE_PROVIDER_SITE_OTHER): Payer: Self-pay

## 2021-07-18 NOTE — Telephone Encounter (Signed)
Patient left a voicemail stating that Sara [name] from [employer] informed her to contact the office for prescription for vacuum system with socket for prothesis. I spoke with Dr Delana Meyer and he advise for the patient to have Sara [name] reach out so we can have the requesting prescription sent in. Patient also stated that her phantom pain has increased and the pain is becoming terrible. Patient informed that she will speak with Dr Delana Meyer on her next appointment 08/01/21.

## 2021-08-01 ENCOUNTER — Ambulatory Visit (INDEPENDENT_AMBULATORY_CARE_PROVIDER_SITE_OTHER): Payer: Medicare Other | Admitting: Vascular Surgery

## 2021-08-01 ENCOUNTER — Ambulatory Visit (INDEPENDENT_AMBULATORY_CARE_PROVIDER_SITE_OTHER): Payer: Medicare Other

## 2021-08-01 ENCOUNTER — Encounter (INDEPENDENT_AMBULATORY_CARE_PROVIDER_SITE_OTHER): Payer: Self-pay | Admitting: Vascular Surgery

## 2021-08-01 ENCOUNTER — Other Ambulatory Visit: Payer: Self-pay

## 2021-08-01 VITALS — BP 136/73 | HR 94 | Resp 18 | Ht 63.0 in | Wt 133.0 lb

## 2021-08-01 DIAGNOSIS — I25119 Atherosclerotic heart disease of native coronary artery with unspecified angina pectoris: Secondary | ICD-10-CM

## 2021-08-01 DIAGNOSIS — I6523 Occlusion and stenosis of bilateral carotid arteries: Secondary | ICD-10-CM | POA: Diagnosis not present

## 2021-08-01 DIAGNOSIS — Z89611 Acquired absence of right leg above knee: Secondary | ICD-10-CM | POA: Diagnosis not present

## 2021-08-01 DIAGNOSIS — I70213 Atherosclerosis of native arteries of extremities with intermittent claudication, bilateral legs: Secondary | ICD-10-CM

## 2021-08-01 DIAGNOSIS — I10 Essential (primary) hypertension: Secondary | ICD-10-CM | POA: Diagnosis not present

## 2021-08-01 NOTE — Progress Notes (Signed)
MRN : [id number]  Sara [name] is a 70 y.o. ([birth year]) female who presents with chief complaint of check my legs.  History of Present Illness:  The patient returns to the office for followup and review of the noninvasive studies. There have been no interval changes in her left lower extremity symptoms. No interval shortening of the patient's left claudication distance or development of rest pain symptoms. No new ulcers or wounds have occurred since the last visit.   Today she notes her prosthesis is no longer fitting and is causing pain secondary to conformational changes and anatomic changes that have been ongoing.  This is preventing her from ambulating easily.     There have been no significant changes to the patient's overall health care.   The patient denies amaurosis fugax or recent TIA symptoms. There are no recent neurological changes noted. The patient denies history of DVT, PE or superficial thrombophlebitis. The patient denies recent episodes of angina or shortness of breath.    ABI Rt=AKA and Lt=1.16  (previous ABI's Rt=AKA and Lt=1.30)  Previous duplex ultrasound of the carotids dated July 10, 2019 demonstrates less than 40% stenosis bilateral internal carotid arteries   No outpatient medications have been marked as taking for the 08/01/21 encounter (Appointment) with Delana Meyer, Dolores Lory, MD.    Past Medical History:  Diagnosis Date   Arthritis    right shoulder   Depression    Dyspnea    GERD (gastroesophageal reflux disease)    History of blood clots    Hyperlipidemia    Hypertension    Mild mitral regurgitation    Osteoporosis    Peripheral vascular disease ([place])    Stomach ulcer    Vascular disease    Sees Dr. Delana Meyer   Vertigo    Last episode approx Aug 2015   Wears dentures    full upper    Past Surgical History:  Procedure Laterality Date   ADENOIDECTOMY     AMPUTATION Right 08/11/2020   Procedure: AMPUTATION ABOVE KNEE;  Surgeon:  [name], MD;  Location: [facility];  Service: Vascular;  Laterality: Right;   ARTERY BIOPSY Right 07/14/2019   Procedure: BIOPSY TEMPORAL ARTERY;  Surgeon: [name], MD;  Location: [facility];  Service: Vascular;  Laterality: Right;   BREAST CYST ASPIRATION Left    CARDIAC CATHETERIZATION  02/15/2017   [facility]   COLONOSCOPY WITH PROPOFOL N/A 10/22/2017   Procedure: COLONOSCOPY WITH PROPOFOL;  Surgeon: Lucilla Lame, MD;  Location: [place];  Service: Endoscopy;  Laterality: N/A;  specimens not taken--pt on Plavix will be brought back in after 7 days off med   COLONOSCOPY WITH PROPOFOL N/A 11/05/2017   Procedure: COLONOSCOPY WITH PROPOFOL;  Surgeon: Lucilla Lame, MD;  Location: [place];  Service: Endoscopy;  Laterality: N/A;   ESOPHAGOGASTRODUODENOSCOPY N/A 12/21/2014   Procedure: ESOPHAGOGASTRODUODENOSCOPY (EGD);  Surgeon: Lucilla Lame, MD;  Location: [place];  Service: Gastroenterology;  Laterality: N/A;   ESOPHAGOGASTRODUODENOSCOPY (EGD) WITH PROPOFOL N/A 02/08/2016   Procedure: ESOPHAGOGASTRODUODENOSCOPY (EGD) WITH PROPOFOL;  Surgeon: Lucilla Lame, MD;  Location: [facility];  Service: Endoscopy;  Laterality: N/A;   FASCIOTOMY Right 05/30/2019   Procedure: FASCIOTOMY;  Surgeon: [name], MD;  Location: [facility];  Service: Vascular;  Laterality: Right;   FASCIOTOMY CLOSURE Right 06/04/2019   Procedure: FASCIOTOMY CLOSURE;  Surgeon: [name], MD;  Location: [facility];  Service: Vascular;  Laterality: Right;   HEMORROIDECTOMY  2014   LOWER  EXTREMITY ANGIOGRAPHY Right 05/30/2019   Procedure: LOWER EXTREMITY ANGIOGRAPHY;  Surgeon: [name], MD;  Location: [place] [facility];  Service: Cardiovascular;  Laterality: Right;   LOWER EXTREMITY ANGIOGRAPHY Right 07/02/2020   Procedure: LOWER EXTREMITY ANGIOGRAPHY;  Surgeon: [name], MD;  Location: [place] [facility];  Service: Cardiovascular;  Laterality: Right;    PERIPHERAL VASCULAR CATHETERIZATION N/A 02/09/2016   Procedure: Abdominal Aortogram w/Lower Extremity;  Surgeon: [name], MD;  Location: [place] [facility];  Service: Cardiovascular;  Laterality: N/A;   PERIPHERAL VASCULAR CATHETERIZATION Right 02/10/2016   Procedure: Lower Extremity Angiography;  Surgeon: [name], MD;  Location: [place] [facility];  Service: Cardiovascular;  Laterality: Right;   POLYPECTOMY  11/05/2017   Procedure: POLYPECTOMY;  Surgeon: Lucilla Lame, MD;  Location: [place];  Service: Endoscopy;;   SHOULDER ARTHROSCOPY WITH ROTATOR CUFF REPAIR AND SUBACROMIAL DECOMPRESSION Right 02/27/2020   Procedure: RIGHT SHOULDER ARTHROSCOPY SUBACROMIAL DECOMPRESSION, DISTAL CLAVICLE EXCISION AND MINI-OPEN ROTATOR CUFF REPAIR;  Surgeon: Thornton Park, MD;  Location: [facility];  Service: Orthopedics;  Laterality: Right;   TONSILLECTOMY     VASCULAR SURGERY  6237,6283   Fem-Pop Bypass    Social History Social History   Tobacco Use   Smoking status: Former    Packs/day: 2.00    Years: 25.00    Pack years: 50.00    Types: Cigarettes    Quit date: 08/22/1991    Years since quitting: 29.9   Smokeless tobacco: Never  Vaping Use   Vaping Use: Never used  Substance Use Topics   Alcohol use: No    Alcohol/week: 0.0 standard drinks   Drug use: Never    Family History Family History  Problem Relation Age of Onset   CVA Mother    Heart attack Mother    Cancer Father        colon cancer   Colon cancer Father    Heart disease Maternal Uncle    Breast cancer Neg Hx     Allergies  Allergen Reactions   Hydrocodone-Acetaminophen     Other reaction(s): Flushing Other reaction(s): Flushing   Amoxicillin Other (See Comments)    Yeast infection   Metoprolol Tartrate Rash   Oxycodone Itching   Vicodin [Hydrocodone-Acetaminophen] Hives and Rash    Flushing     REVIEW OF SYSTEMS (Negative unless checked)  Constitutional: [] Weight loss  [] Fever   [] Chills Cardiac: [] Chest pain   [] Chest pressure   [] Palpitations   [] Shortness of breath when laying flat   [] Shortness of breath with exertion. Vascular:  [] Pain in legs with walking   [] Pain in legs at rest  [] History of DVT   [] Phlebitis   [] Swelling in legs   [] Varicose veins   [] Non-healing ulcers Pulmonary:   [] Uses home oxygen   [] Productive cough   [] Hemoptysis   [] Wheeze  [] COPD   [] Asthma Neurologic:  [] Dizziness   [] Seizures   [] History of stroke   [] History of TIA  [] Aphasia   [] Vissual changes   [] Weakness or numbness in arm   [] Weakness or numbness in leg Musculoskeletal:   [] Joint swelling   [] Joint pain   [] Low back pain Hematologic:  [] Easy bruising  [] Easy bleeding   [] Hypercoagulable state   [] Anemic Gastrointestinal:  [] Diarrhea   [] Vomiting  [] Gastroesophageal reflux/heartburn   [] Difficulty swallowing. Genitourinary:  [] Chronic kidney disease   [] Difficult urination  [] Frequent urination   [] Blood in urine Skin:  [] Rashes   [] Ulcers  Psychological:  [] History  of anxiety   []  History of major depression.  Physical Examination  There were no vitals filed for this visit. There is no height or weight on file to calculate BMI. Gen: WD/WN, NAD Head: [place]/AT, No temporalis wasting.  Ear/Nose/Throat: Hearing grossly intact, nares w/o erythema or drainage Eyes: PER, EOMI, sclera nonicteric.  Neck: Supple, no masses.  No bruit or JVD.  Pulmonary:  Good air movement, no audible wheezing, no use of accessory muscles.  Cardiac: RRR, normal S1, S2, no Murmurs. Vascular:   Vessel Right Left  Radial Palpable Palpable  PT AKA Not Palpable  DP AKA Not Palpable  Gastrointestinal: soft, non-distended. No guarding/no peritoneal signs.  Musculoskeletal: M/S 5/5 throughout.  No visible deformity.  Neurologic: CN 2-12 intact. Pain and light touch intact in extremities.  Symmetrical.  Speech is fluent. Motor exam as listed above. Psychiatric: Judgment intact, Mood & affect appropriate for  pt's clinical situation. Dermatologic: No rashes or ulcers noted.  No changes consistent with cellulitis.   CBC Lab Results  Component Value Date   WBC 5.3 06/24/2021   HGB 13.7 06/24/2021   HCT 42.1 06/24/2021   MCV 97.5 06/24/2021   PLT 163 06/24/2021    BMET    Component Value Date/Time   NA 136 06/24/2021 1139   NA 139 09/21/2015 0916   K 3.8 06/24/2021 1139   CL 102 06/24/2021 1139   CO2 26 06/24/2021 1139   GLUCOSE 105 (H) 06/24/2021 1139   BUN 15 06/24/2021 1139   BUN 12 09/21/2015 0916   CREATININE 0.80 06/24/2021 1139   CREATININE 0.88 05/16/2016 1034   CALCIUM 8.7 (L) 06/24/2021 1139   GFRNONAA >60 06/24/2021 1139   GFRNONAA 66 05/01/2016 0846   GFRAA >60 02/18/2020 0949   GFRAA 76 05/01/2016 0846   CrCl cannot be calculated (Patient's most recent lab result is older than the maximum 21 days allowed.).  COAG Lab Results  Component Value Date   INR 1.0 08/09/2020   INR 1.3 (H) 02/18/2020   INR 1.0 07/11/2019    Radiology No results found.   Assessment/Plan 1. Atherosclerosis of native artery of both lower extremities with intermittent claudication (HCC)  Recommend:  The patient has evidence of atherosclerosis of the lower extremities with claudication.  The patient does not voice lifestyle limiting changes at this point in time.  Noninvasive studies do not suggest clinically significant change.  No invasive studies, angiography or surgery at this time The patient should continue walking and begin a more formal exercise program.  The patient should continue antiplatelet therapy and aggressive treatment of the lipid abnormalities  No changes in the patient's medications at this time  - VAS [place] ABI WITH/WO TBI; Future  2. S/P AKA (above knee amputation), right ([place]) Today she notes her prosthesis is no longer fitting and is causing pain secondary to conformational changes and anatomic changes that have been ongoing.  This is preventing her from  ambulating easily.    Prescription for a prosthesis given  3. Bilateral carotid artery stenosis Recommend:  Given the patient's asymptomatic subcritical stenosis no further invasive testing or surgery at this time.    Continue antiplatelet therapy as prescribed Continue management of CAD, HTN and Hyperlipidemia Healthy heart diet,  encouraged exercise at least 4 times per week.  Follow up in 12 months with duplex ultrasound and physical exam    4. Essential hypertension Continue antihypertensive medications as already ordered, these medications have been reviewed and there are no changes at  this time.   5. Coronary artery disease involving native coronary artery of native heart with angina pectoris ([place]) Continue cardiac and antihypertensive medications as already ordered and reviewed, no changes at this time.  Continue statin as ordered and reviewed, no changes at this time  Nitrates PRN for chest pain     [name], MD  08/01/2021 8:51 AM

## 2021-08-06 ENCOUNTER — Encounter (INDEPENDENT_AMBULATORY_CARE_PROVIDER_SITE_OTHER): Payer: Self-pay | Admitting: Vascular Surgery

## 2021-08-29 ENCOUNTER — Ambulatory Visit (INDEPENDENT_AMBULATORY_CARE_PROVIDER_SITE_OTHER): Payer: Medicare Other | Admitting: Vascular Surgery

## 2021-08-29 ENCOUNTER — Other Ambulatory Visit: Payer: Self-pay

## 2021-08-29 ENCOUNTER — Encounter (INDEPENDENT_AMBULATORY_CARE_PROVIDER_SITE_OTHER): Payer: Self-pay | Admitting: Vascular Surgery

## 2021-08-29 VITALS — BP 101/70 | HR 90 | Resp 16 | Wt 136.8 lb

## 2021-08-29 DIAGNOSIS — I872 Venous insufficiency (chronic) (peripheral): Secondary | ICD-10-CM

## 2021-08-29 DIAGNOSIS — I10 Essential (primary) hypertension: Secondary | ICD-10-CM

## 2021-08-29 DIAGNOSIS — I6523 Occlusion and stenosis of bilateral carotid arteries: Secondary | ICD-10-CM

## 2021-08-29 DIAGNOSIS — I70213 Atherosclerosis of native arteries of extremities with intermittent claudication, bilateral legs: Secondary | ICD-10-CM | POA: Diagnosis not present

## 2021-08-29 DIAGNOSIS — I25119 Atherosclerotic heart disease of native coronary artery with unspecified angina pectoris: Secondary | ICD-10-CM

## 2021-08-29 NOTE — Progress Notes (Signed)
MRN : [id number]  Sara [name] is a 71 y.o. (15-Jul-1951) female who presents with chief complaint of phantom pain.  History of Present Illness:  The patient returns to the office sooner than expected with complaints of phantom pains involving the right lower extremity and the sensation that he she is still having excruciating pain in her right foot.  She actually feels that the discomfort/pain is intensifying with time not going away.  At the present time she is now taking 1600 mg total of gabapentin per day.  She is also taking Cymbalta.  She does note that she has been through physical therapy and has done some mirror therapy as well.  Otherwise there are no significant changes in her overall health care and medical condition.  No outpatient medications have been marked as taking for the 08/29/21 encounter (Appointment) with Delana Meyer, Dolores Lory, MD.    Past Medical History:  Diagnosis Date   Arthritis    right shoulder   Depression    Dyspnea    GERD (gastroesophageal reflux disease)    History of blood clots    Hyperlipidemia    Hypertension    Mild mitral regurgitation    Osteoporosis    Peripheral vascular disease ([place])    Stomach ulcer    Vascular disease    Sees Dr. Delana Meyer   Vertigo    Last episode approx Aug 2015   Wears dentures    full upper    Past Surgical History:  Procedure Laterality Date   ADENOIDECTOMY     AMPUTATION Right 08/11/2020   Procedure: AMPUTATION ABOVE KNEE;  Surgeon: [name], MD;  Location: [facility];  Service: Vascular;  Laterality: Right;   ARTERY BIOPSY Right 07/14/2019   Procedure: BIOPSY TEMPORAL ARTERY;  Surgeon: [name], MD;  Location: [facility];  Service: Vascular;  Laterality: Right;   BREAST CYST ASPIRATION Left    CARDIAC CATHETERIZATION  02/15/2017   [facility]   COLONOSCOPY WITH PROPOFOL N/A 10/22/2017   Procedure: COLONOSCOPY WITH PROPOFOL;  Surgeon: Lucilla Lame, MD;  Location: [place];  Service:  Endoscopy;  Laterality: N/A;  specimens not taken--pt on Plavix will be brought back in after 7 days off med   COLONOSCOPY WITH PROPOFOL N/A 11/05/2017   Procedure: COLONOSCOPY WITH PROPOFOL;  Surgeon: Lucilla Lame, MD;  Location: [place];  Service: Endoscopy;  Laterality: N/A;   ESOPHAGOGASTRODUODENOSCOPY N/A 12/21/2014   Procedure: ESOPHAGOGASTRODUODENOSCOPY (EGD);  Surgeon: Lucilla Lame, MD;  Location: [place];  Service: Gastroenterology;  Laterality: N/A;   ESOPHAGOGASTRODUODENOSCOPY (EGD) WITH PROPOFOL N/A 02/08/2016   Procedure: ESOPHAGOGASTRODUODENOSCOPY (EGD) WITH PROPOFOL;  Surgeon: Lucilla Lame, MD;  Location: [facility];  Service: Endoscopy;  Laterality: N/A;   FASCIOTOMY Right 05/30/2019   Procedure: FASCIOTOMY;  Surgeon: [name], MD;  Location: [facility];  Service: Vascular;  Laterality: Right;   FASCIOTOMY CLOSURE Right 06/04/2019   Procedure: FASCIOTOMY CLOSURE;  Surgeon: [name], MD;  Location: [facility];  Service: Vascular;  Laterality: Right;   HEMORROIDECTOMY  2014   LOWER EXTREMITY ANGIOGRAPHY Right 05/30/2019   Procedure: LOWER EXTREMITY ANGIOGRAPHY;  Surgeon: [name], MD;  Location: [place] [facility];  Service: Cardiovascular;  Laterality: Right;   LOWER EXTREMITY ANGIOGRAPHY Right 07/02/2020   Procedure: LOWER EXTREMITY ANGIOGRAPHY;  Surgeon: [name], MD;  Location: [place] [facility];  Service: Cardiovascular;  Laterality: Right;   PERIPHERAL VASCULAR CATHETERIZATION N/A 02/09/2016   Procedure: Abdominal Aortogram w/Lower  Extremity;  Surgeon: [name], MD;  Location: [place] [facility];  Service: Cardiovascular;  Laterality: N/A;   PERIPHERAL VASCULAR CATHETERIZATION Right 02/10/2016   Procedure: Lower Extremity Angiography;  Surgeon: [name], MD;  Location: [place] [facility];  Service: Cardiovascular;  Laterality: Right;   POLYPECTOMY  11/05/2017   Procedure: POLYPECTOMY;  Surgeon:  Lucilla Lame, MD;  Location: [place];  Service: Endoscopy;;   SHOULDER ARTHROSCOPY WITH ROTATOR CUFF REPAIR AND SUBACROMIAL DECOMPRESSION Right 02/27/2020   Procedure: RIGHT SHOULDER ARTHROSCOPY SUBACROMIAL DECOMPRESSION, DISTAL CLAVICLE EXCISION AND MINI-OPEN ROTATOR CUFF REPAIR;  Surgeon: Thornton Park, MD;  Location: [facility];  Service: Orthopedics;  Laterality: Right;   TONSILLECTOMY     VASCULAR SURGERY  5427,0623   Fem-Pop Bypass    Social History Social History   Tobacco Use   Smoking status: Former    Packs/day: 2.00    Years: 25.00    Pack years: 50.00    Types: Cigarettes    Quit date: 08/22/1991    Years since quitting: 30.0   Smokeless tobacco: Never  Vaping Use   Vaping Use: Never used  Substance Use Topics   Alcohol use: No    Alcohol/week: 0.0 standard drinks   Drug use: Never    Family History Family History  Problem Relation Age of Onset   CVA Mother    Heart attack Mother    Cancer Father        colon cancer   Colon cancer Father    Heart disease Maternal Uncle    Breast cancer Neg Hx     Allergies  Allergen Reactions   Hydrocodone-Acetaminophen     Other reaction(s): Flushing Other reaction(s): Flushing   Amoxicillin Other (See Comments)    Yeast infection   Metoprolol Tartrate Rash   Oxycodone Itching   Vicodin [Hydrocodone-Acetaminophen] Hives and Rash    Flushing     REVIEW OF SYSTEMS (Negative unless checked)  Constitutional: [] Weight loss  [] Fever  [] Chills Cardiac: [] Chest pain   [] Chest pressure   [] Palpitations   [] Shortness of breath when laying flat   [] Shortness of breath with exertion. Vascular:  [x] Pain in legs with walking   [x] Pain in legs at rest  [] History of DVT   [] Phlebitis   [] Swelling in legs   [] Varicose veins   [] Non-healing ulcers Pulmonary:   [] Uses home oxygen   [] Productive cough   [] Hemoptysis   [] Wheeze  [] COPD   [] Asthma Neurologic:  [] Dizziness   [] Seizures   [] History of stroke   [] History of  TIA  [] Aphasia   [] Vissual changes   [] Weakness or numbness in arm   [] Weakness or numbness in leg Musculoskeletal:   [] Joint swelling   [x] Joint pain   [] Low back pain Hematologic:  [] Easy bruising  [] Easy bleeding   [] Hypercoagulable state   [] Anemic Gastrointestinal:  [] Diarrhea   [] Vomiting  [x] Gastroesophageal reflux/heartburn   [] Difficulty swallowing. Genitourinary:  [] Chronic kidney disease   [] Difficult urination  [] Frequent urination   [] Blood in urine Skin:  [] Rashes   [] Ulcers  Psychological:  [] History of anxiety   []  History of major depression.  Physical Examination  There were no vitals filed for this visit. There is no height or weight on file to calculate BMI. Gen: WD/WN, NAD Head: Echo/AT, No temporalis wasting.  Ear/Nose/Throat: Hearing grossly intact, nares w/o erythema or drainage Eyes: PER, EOMI, sclera nonicteric.  Neck: Supple, no masses.  No bruit or JVD.  Pulmonary:  Good air movement, no  audible wheezing, no use of accessory muscles.  Cardiac: RRR, normal S1, S2, no Murmurs. Vascular:   Vessel Right Left  Radial Palpable Palpable  PT AKA Palpable  DP AKA Palpable  Gastrointestinal: soft, non-distended. No guarding/no peritoneal signs.  Musculoskeletal: M/S 5/5 throughout.  No visible deformity.  Neurologic: CN 2-12 intact. Pain and light touch intact in extremities.  Symmetrical.  Speech is fluent. Motor exam as listed above. Psychiatric: Judgment intact, Mood & affect appropriate for pt's clinical situation. Dermatologic: No rashes or ulcers noted.  No changes consistent with cellulitis.   CBC Lab Results  Component Value Date   WBC 5.3 06/24/2021   HGB 13.7 06/24/2021   HCT 42.1 06/24/2021   MCV 97.5 06/24/2021   PLT 163 06/24/2021    BMET    Component Value Date/Time   NA 136 06/24/2021 1139   NA 139 09/21/2015 0916   K 3.8 06/24/2021 1139   CL 102 06/24/2021 1139   CO2 26 06/24/2021 1139   GLUCOSE 105 (H) 06/24/2021 1139   BUN 15  06/24/2021 1139   BUN 12 09/21/2015 0916   CREATININE 0.80 06/24/2021 1139   CREATININE 0.88 05/16/2016 1034   CALCIUM 8.7 (L) 06/24/2021 1139   GFRNONAA >60 06/24/2021 1139   GFRNONAA 66 05/01/2016 0846   GFRAA >60 02/18/2020 0949   GFRAA 76 05/01/2016 0846   CrCl cannot be calculated (Patient's most recent lab result is older than the maximum 21 days allowed.).  COAG Lab Results  Component Value Date   INR 1.0 08/09/2020   INR 1.3 (H) 02/18/2020   INR 1.0 07/11/2019    Radiology VAS [place] ABI WITH/WO TBI  Result Date: 08/01/2021  LOWER EXTREMITY DOPPLER STUDY Patient Name:  NAVAYA WIATREK  Date of Exam:   08/01/2021 Medical Rec #: [id number]        Accession #:    [id number] Date of Birth: [birth year]         Patient Gender: F Patient Age:   35 years Exam Location:  [facility] Procedure:      VAS [place] ABI WITH/WO TBI Referring Phys: --------------------------------------------------------------------------------  Indications: Rest pain, and peripheral artery disease. High Risk Factors: Hypertension.  Vascular Interventions: 02/10/2016 PTA of right femoral to popliteal autogenous                         bypass graft. PTA right distal posterior tibial artery.                         12/2008 and 02/1990 Right femoral to popliteal autogenous                         bypass graft.                          05/30/2019: PTA and Stent placement at 2 locations in                         the Femoral below knee poplteal bypass. Mechanical                         Thrombectomy Right Popliteal bypass and TibioPeroneal                         trunk and  Proximal Peroneal Artery. Infusion of TPA to                         the Right Femoral-Popliteal Bypass.                          05/30/2019: Compartment Syndrome Right Lower Extremity                         Status Post Revascularization.                          06/04/2019: Compartment Syndrome Status Post                         Fasciotomies. Comparison  Study: 01/27/2021 Performing Technologist: Concha Norway RVT  Examination Guidelines: A complete evaluation includes at minimum, Doppler waveform signals and systolic blood pressure reading at the level of bilateral brachial, anterior tibial, and posterior tibial arteries, when vessel segments are accessible. Bilateral testing is considered an integral part of a complete examination. Photoelectric Plethysmograph (PPG) waveforms and toe systolic pressure readings are included as required and additional duplex testing as needed. Limited examinations for reoccurring indications may be performed as noted.  ABI Findings: +--------+------------------+-----+--------+--------+  Right    Rt Pressure (mmHg) Index Waveform Comment   +--------+------------------+-----+--------+--------+  Brachial 140                                         +--------+------------------+-----+--------+--------+ +---------+------------------+-----+---------+-------+  Left      Lt Pressure (mmHg) Index Waveform  Comment  +---------+------------------+-----+---------+-------+  Brachial  140                                         +---------+------------------+-----+---------+-------+  ATA       162                      triphasic 1.16     +---------+------------------+-----+---------+-------+  PTA       157                1.12  triphasic          +---------+------------------+-----+---------+-------+  Great Toe 100                0.71  Abnormal           +---------+------------------+-----+---------+-------+ +-------+-----------+-----------+------------+------------+  ABI/TBI Today's ABI Today's TBI Previous ABI Previous TBI  +-------+-----------+-----------+------------+------------+  Right   AKA                     AKA                        +-------+-----------+-----------+------------+------------+  Left    1.16        .71         1.30         .55           +-------+-----------+-----------+------------+------------+ Left TBIs appear increased  compared to prior study on 01/2021.  Summary: Right: AKA. Left: Resting left ankle-brachial index is within normal range. No evidence of significant left lower extremity arterial  disease. The left toe-brachial index is normal.  *See table(s) above for measurements and observations.  Electronically signed by [name] MD on 08/01/2021 at 4:52:41 PM.    Final      Assessment/Plan 1. Atherosclerosis of native artery of both lower extremities with intermittent claudication (HCC) In my experience it is somewhat unusual for phantom pains to intensify with time rather than dissipate.  In review she is already on a significant dose of gabapentin therefore I do not think increasing the dose is appropriate at this time.  Adding an antidepressant has already been done.  I would like to investigate whether pain management might have something to offer, whether they have any insight into available resources to treat phantom pain.  I also think it might be helpful to reengage physical therapy and see if they could continue to work with her in an attempt to break this cognitive loop.  I have asked that she return to see me in 1 month to see if any of these ideas have been helpful.  2. Bilateral carotid artery stenosis Recommend:   Given the patient's asymptomatic subcritical stenosis no further invasive testing or surgery at this time.    Continue antiplatelet therapy as prescribed Continue management of CAD, HTN and Hyperlipidemia Healthy heart diet,  encouraged exercise at least 4 times per week.   Follow up in 12 months with duplex ultrasound and physical exam    3. Chronic venous insufficiency No surgery or intervention at this point in time.    I have had a long discussion with the patient regarding venous insufficiency and why it  causes symptoms. I have discussed with the patient the chronic skin changes that accompany venous insufficiency and the long term sequela such as infection and ulceration.   Patient will begin wearing graduated compression stockings class 1 (20-30 mmHg) or compression wraps on a daily basis a prescription was given. The patient will put the stockings on first thing in the morning and removing them in the evening. The patient is instructed specifically not to sleep in the stockings.    In addition, behavioral modification including several periods of elevation of the lower extremities during the day will be continued. I have demonstrated that proper elevation is a position with the ankles at heart level.  The patient is instructed to begin routine exercise, especially walking on a daily basis  4. Coronary artery disease involving native coronary artery of native heart with angina pectoris ([place]) Continue cardiac and antihypertensive medications as already ordered and reviewed, no changes at this time.  Continue statin as ordered and reviewed, no changes at this time  Nitrates PRN for chest pain   5. Essential hypertension Continue antihypertensive medications as already ordered, these medications have been reviewed and there are no changes at this time.     [name], MD  08/29/2021 12:21 PM

## 2021-09-05 ENCOUNTER — Other Ambulatory Visit: Payer: Self-pay

## 2021-09-05 ENCOUNTER — Ambulatory Visit: Payer: Medicare Other | Attending: Vascular Surgery

## 2021-09-05 DIAGNOSIS — M79604 Pain in right leg: Secondary | ICD-10-CM | POA: Diagnosis not present

## 2021-09-05 DIAGNOSIS — I70213 Atherosclerosis of native arteries of extremities with intermittent claudication, bilateral legs: Secondary | ICD-10-CM | POA: Diagnosis not present

## 2021-09-05 DIAGNOSIS — G546 Phantom limb syndrome with pain: Secondary | ICD-10-CM

## 2021-09-05 DIAGNOSIS — M6281 Muscle weakness (generalized): Secondary | ICD-10-CM

## 2021-09-05 DIAGNOSIS — R2681 Unsteadiness on feet: Secondary | ICD-10-CM | POA: Diagnosis not present

## 2021-09-05 DIAGNOSIS — R262 Difficulty in walking, not elsewhere classified: Secondary | ICD-10-CM

## 2021-09-05 NOTE — Therapy (Signed)
[facility] [address] [place], [place], [address] Phone: [phone]   Fax:  [phone]  Physical Therapy Evaluation   Patient Details  Name: Sara [name] MRN: [id number] Date of Birth: [birth year] Referring Provider (PT): Dr. Delana Meyer   Encounter Date: 09/05/2021   PT End of Session - 09/05/21 1205     Visit Number 1    Number of Visits 25    Date for PT Re-Evaluation 11/28/21    Authorization Type eval: 09/05/21    PT Start Time 1018    PT Stop Time 1103    PT Time Calculation (min) 45 min    Equipment Utilized During Treatment Gait belt    Activity Tolerance Patient tolerated treatment well    Behavior During Therapy [facility] for tasks assessed/performed             Past Medical History:  Diagnosis Date   Arthritis    right shoulder   Depression    Dyspnea    GERD (gastroesophageal reflux disease)    History of blood clots    Hyperlipidemia    Hypertension    Mild mitral regurgitation    Osteoporosis    Peripheral vascular disease ([place])    Stomach ulcer    Vascular disease    Sees Dr. Delana Meyer   Vertigo    Last episode approx Aug 2015   Wears dentures    full upper    Past Surgical History:  Procedure Laterality Date   ADENOIDECTOMY     AMPUTATION Right 08/11/2020   Procedure: AMPUTATION ABOVE KNEE;  Surgeon: [name], MD;  Location: [facility];  Service: Vascular;  Laterality: Right;   ARTERY BIOPSY Right 07/14/2019   Procedure: BIOPSY TEMPORAL ARTERY;  Surgeon: [name], MD;  Location: [facility];  Service: Vascular;  Laterality: Right;   BREAST CYST ASPIRATION Left    CARDIAC CATHETERIZATION  02/15/2017   [facility]   COLONOSCOPY WITH PROPOFOL N/A 10/22/2017   Procedure: COLONOSCOPY WITH PROPOFOL;  Surgeon: Lucilla Lame, MD;  Location: [place];  Service: Endoscopy;  Laterality: N/A;  specimens not taken--pt on Plavix will be brought back in after 7 days off med   COLONOSCOPY WITH  PROPOFOL N/A 11/05/2017   Procedure: COLONOSCOPY WITH PROPOFOL;  Surgeon: Lucilla Lame, MD;  Location: [place];  Service: Endoscopy;  Laterality: N/A;   ESOPHAGOGASTRODUODENOSCOPY N/A 12/21/2014   Procedure: ESOPHAGOGASTRODUODENOSCOPY (EGD);  Surgeon: Lucilla Lame, MD;  Location: [place];  Service: Gastroenterology;  Laterality: N/A;   ESOPHAGOGASTRODUODENOSCOPY (EGD) WITH PROPOFOL N/A 02/08/2016   Procedure: ESOPHAGOGASTRODUODENOSCOPY (EGD) WITH PROPOFOL;  Surgeon: Lucilla Lame, MD;  Location: [facility];  Service: Endoscopy;  Laterality: N/A;   FASCIOTOMY Right 05/30/2019   Procedure: FASCIOTOMY;  Surgeon: [name], MD;  Location: [facility];  Service: Vascular;  Laterality: Right;   FASCIOTOMY CLOSURE Right 06/04/2019   Procedure: FASCIOTOMY CLOSURE;  Surgeon: [name], MD;  Location: [facility];  Service: Vascular;  Laterality: Right;   HEMORROIDECTOMY  2014   LOWER EXTREMITY ANGIOGRAPHY Right 05/30/2019   Procedure: LOWER EXTREMITY ANGIOGRAPHY;  Surgeon: [name], MD;  Location: [place] [facility];  Service: Cardiovascular;  Laterality: Right;   LOWER EXTREMITY ANGIOGRAPHY Right 07/02/2020   Procedure: LOWER EXTREMITY ANGIOGRAPHY;  Surgeon: [name], MD;  Location: [place] [facility];  Service: Cardiovascular;  Laterality: Right;   PERIPHERAL VASCULAR CATHETERIZATION N/A 02/09/2016   Procedure: Abdominal Aortogram w/Lower Extremity;  Surgeon:  [name], MD;  Location: [place] [facility];  Service: Cardiovascular;  Laterality: N/A;   PERIPHERAL VASCULAR CATHETERIZATION Right 02/10/2016   Procedure: Lower Extremity Angiography;  Surgeon: [name], MD;  Location: [place] [facility];  Service: Cardiovascular;  Laterality: Right;   POLYPECTOMY  11/05/2017   Procedure: POLYPECTOMY;  Surgeon: Lucilla Lame, MD;  Location: [place];  Service: Endoscopy;;   SHOULDER ARTHROSCOPY WITH ROTATOR CUFF REPAIR AND  SUBACROMIAL DECOMPRESSION Right 02/27/2020   Procedure: RIGHT SHOULDER ARTHROSCOPY SUBACROMIAL DECOMPRESSION, DISTAL CLAVICLE EXCISION AND MINI-OPEN ROTATOR CUFF REPAIR;  Surgeon: Thornton Park, MD;  Location: [facility];  Service: Orthopedics;  Laterality: Right;   TONSILLECTOMY     VASCULAR SURGERY  0109,3235   Fem-Pop Bypass    There were no vitals filed for this visit.    Subjective Assessment - 09/05/21 1026     Subjective RLE phantom pain    Pertinent History Pt reports slight worsening RLE phantom pain since ending therapy. She is taking gabapentin which helps with the pain however it is still very uncomfortable. Pain is continuous and is worse after extended walking with her prosthetic. She has not been performing her HEP after last discharge. She will get a suction fit socket for her RLE prosthesis this Friday.   History from 01/26/21: Pt has a history of RLE vascular issues since 1991. 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 She received her RLE prosthetic in April 2022 and has been receiving [facility] since her surgery. They discharged her yesterday to continue with OP PT. She continues with phantom RLE pain, especially at night and takes gabapentin with minimal relief. She has also been having R shoulder pain since the surgery which she attributes to using a wheelchair. She underwent R RTC surgery in September of 2021 and had physical therapy afterwards. She has been having issues with bilateral carpal tunnel and has a NCV study scheduled for tomorrow.   History from 01/26/21: Pt has a history of RLE vascular issues since 1991. 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 She received her RLE prosthetic in April 2022 and has been receiving [facility] since her surgery. They discharged her yesterday to continue with OP PT. She continues with phantom RLE pain, especially at night and takes gabapentin with minimal relief. She has  also been having R shoulder pain since the surgery which she attributes to using a wheelchair. She underwent R RTC surgery in September of 2021 and had physical therapy afterwards. She has been having issues with bilateral carpal tunnel and has a NCV study scheduled for tomorrow.    Limitations Walking    Diagnostic tests see history    Patient Stated Goals Ambulate with her prosthetic    Currently in Pain? Yes    Pain Score 6    Worst: 7/10, Best: 5/10   Pain Location Leg    Pain Orientation Right;Lower    Pain Descriptors / Indicators Burning    Pain Type Phantom pain    Pain Onset More than a month ago    Pain Frequency Constant    Aggravating Factors  after extended walking with prosthetic    Pain Relieving Factors gabapentin    Multiple Pain Sites No                [facility] PT Assessment - 09/05/21 1202       Assessment   Medical Diagnosis Atherosclerosis  of native artery of both lower extremities with intermittent claudication [facility])    Referring Provider (PT) Dr. Delana Meyer    Onset Date/Surgical Date 08/11/20    Hand Dominance Right    Next MD Visit Dr. Delana Meyer 01/27/21    Prior Therapy OP PT at this clinic, [facility] after her amputation      Precautions   Precautions Fall      Restrictions   Weight Bearing Restrictions No      Balance Screen   Has the patient fallen in the past 6 months Yes    How many times? 3    Has the patient had a decrease in activity level because of a fear of falling?  No    Is the patient reluctant to leave their home because of a fear of falling?  No      Home Social worker Private residence    Living Arrangements Alone    Available Help at Discharge Friend(s)    Type of [place] Access Level entry    Home Layout One level    [place] - 2 wheels   lofstrand crutches     Prior Function   Level of Independence Independent      Cognition   Overall Cognitive Status Within Functional Limits for  tasks assessed      Standardized Balance Assessment   Standardized Balance Assessment --      Berg Balance Test   Sit to Stand --    Standing Unsupported --    Sitting with Back Unsupported but Feet Supported on Floor or Stool --    Stand to Sit --    Transfers --    Standing Unsupported with Eyes Closed --    Standing Unsupported with Feet Together --    From Standing, Reach Forward with Outstretched Arm --    From Standing Position, Pick up Object from Floor --    From Standing Position, Turn to Look Behind Over each Shoulder --    Turn 360 Degrees --    Standing Unsupported, Alternately Place Feet on Step/Stool --    Standing Unsupported, One Foot in Front --    Standing on One Leg --    Total Score --              SUBJECTIVE Chief complaint: RLE phantom pain History: Pt reports slight worsening RLE phantom pain since ending therapy. She is taking gabapentin which helps with the pain however it is still very uncomfortable. Pain is continuous and is worse after extended walking with her prosthetic. She has not been performing her HEP after last discharge. She will get a suction fit socket for her RLE prosthesis this Friday.  History from 01/26/21: Pt has a history of RLE vascular issues since 1991. 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 She received her RLE prosthetic in April 2022 and has been receiving [facility] since her surgery. They discharged her yesterday to continue with OP PT. She continues with phantom RLE pain, especially at night and takes gabapentin with minimal relief. She has also been having R shoulder pain since the surgery which she attributes to using a wheelchair. She underwent R RTC surgery in September of 2021 and had physical therapy afterwards. She has been having issues with bilateral carpal tunnel and has a NCV study scheduled for tomorrow. Red flags (bowel/bladder changes, saddle paresthesia, personal history of cancer,  chills/fever, night sweats, unrelenting pain, unexplained weight gain/loss): Negative   Referring Dx: Atherosclerosis of native artery of both lower extremities with intermittent claudication [facility]) Referring Provider: Dr. Delana Meyer Follow-up appt scheduled with provider: No, none reported  Pain Location: phantom pain in R lower leg and foot which were amputated Pain quality: burning Pain Intensity: Present: 6/10, Best: 5/10, Worst: 7/10 24 hour pain behavior: no pattern Aggravating factors: pain increases after wearing prosthetic RLE Easing factors: gabapentin Radiating symptoms: No  Numbness/Tingling: No Falls in the last 6 months: Yes, 3 falls all in the same day;  Dominant hand: right Occupational demands: retired Office manager: taking the dogs out, watching TV   OBJECTIVE   MUSCULOSKELETAL: Tremor: Absent Bulk: Normal Tone: Normal, no clonus    Gait Pt ambulates with a single Lofstrand crutch in RUE using partial step-through pattern. Decreased stance time on RLE but significantly improved self-selected gait speed compared to her initial evaluation last June. She is able to ambulate around the clinic as well as enter/exit clinic without any concern for imbalance;    Strength R/L 4+/4+ Hip flexion 4/4 Hip extension  4 (without prosthetic resists full force above knee)/4 Hip abduction 4/4+ Hip adduction NT/5 Knee extension NT/5 Knee flexion NT/Active Ankle Plantarflexion NT/4+ Ankle Dorsiflexion     NEUROLOGICAL:   Mental Status Patient is oriented to person, place and time.  Recent memory is intact.  Remote memory is intact.  Attention span and concentration are intact.  Expressive speech is intact.  Patient's fund of knowledge is within normal limits for educational level.   Sensation Pt reports full sensation to residual RLE, possibly slightly hypersensitive but no increase in pain with light touch. Skin flap well healed. Proprioception and hot/cold testing deferred  on this date   Reflexes Deferred    Coordination/Cerebellar Deferred            Objective measurements completed on examination: See above findings.       TREATMENT   Neuromuscular Re-education  Mirror therapy performed with patient sitting at edge of mat table and mirror between legs to simulate right lower extremity.  While watching the mirror with LLE patient performed seated marches x 1 minute with 4# ankle weight, long arc quads with 4# ankle weight x 1 minute, L ankle AROM plantar flexion/dorsiflexion x 1 minute, L ankle ball circles CW/CCW x 1 minute;         PT Education - 09/05/21 1205     Education Details plan of care    Person(s) Educated Patient    Methods Explanation    Comprehension Verbalized understanding              PT Short Term Goals - 09/05/21 1215       PT SHORT TERM GOAL #1   Title Pt will be independent with HEP in order to improve strength and balance as well as decrase pain in order to decrease fall risk and improve pain-free function at home.    Time 6    Period Weeks    Status New    Target Date 10/17/21               PT Long Term Goals - 09/05/21 1217       PT LONG TERM GOAL #1   Title Pt will decrease worst RLE phantom pain by at least 3 points on the NPRS in order to improve pain-free function at home    Baseline 09/05/21: Worst: 7/10    Time 12  Period Weeks    Status New    Target Date 11/28/21      PT LONG TERM GOAL #2   Title Pt will improve ABC by at least 13% in order to demonstrate clinically significant improvement in balance confidence.    Baseline 09/05/21: To be completed    Time 12    Period Weeks    Status New    Target Date 11/28/21      PT LONG TERM GOAL #3   Title Pt will improve FOTO to at least 59 in order to demonstrate clinically significant improvement in function related to her RLE amputation    Baseline 09/05/21: 53    Time 12    Period Weeks    Status New    Target Date  11/28/21                    Plan - 09/05/21 1214     Clinical Impression Statement Pt is a pleasant 71 year-old female referred for phantom pain s/p RLE AKA. Pt previously worked with therapist and made excellent progress with respect to her mobility however reports worsening phantom RLE pain since ending therapy. Mild hypersensitivity noted to light touch sensation testing of residual RLE. She continues with some decreased strength in R hip abduction, adduction, and extension. Pt will pick up her suction fit socket for her RLE prosthesis at the end of this week and therapist will assess her balance and functional mobility at next session. Pt presents with deficits in strength, gait, balance, and pain and will benefit from skilled PT services to address these deficits and improve function at home.    Personal Factors and Comorbidities Age;Comorbidity 3+    Comorbidities Depression, osteoporosis, vascular disease    Examination-Activity Limitations Bathing;Locomotion Level;Squat;Stairs;Stand;Transfers    Examination-Participation Restrictions Cleaning;Community Activity;Meal Prep;Yard Work    Stability/Clinical Decision Making Unstable/Unpredictable    Clinical Decision Making Moderate    Rehab Potential Good    PT Frequency 2x / week    PT Duration 12 weeks    PT Treatment/Interventions ADLs/Self Care Home Management;Aquatic Therapy;Canalith Repostioning;Cryotherapy;Electrical Stimulation;Iontophoresis 4mg /ml Dexamethasone;Moist Heat;Traction;Ultrasound;Gait training;Stair training;Therapeutic activities;Therapeutic exercise;Neuromuscular re-education;Manual techniques;Dry needling;Vestibular;Spinal Manipulations;Joint Manipulations;Balance training;Patient/family education;Cognitive remediation;Passive range of motion;Visual/perceptual remediation/compensation    PT Next Visit Plan mirror therapy, desensitization techniques (STM, superficial stimulation)    PT Home Exercise Plan Last  episode access code: [email or web address]    Consulted and Agree with Plan of Care Patient             Patient will benefit from skilled therapeutic intervention in order to improve the following deficits and impairments:  Abnormal gait, Decreased strength, Difficulty walking, Pain  Visit Diagnosis: Phantom limb syndrome with pain (HCC)  Muscle weakness (generalized)  Difficulty in walking, not elsewhere classified     Problem List Patient Active Problem List   Diagnosis Date Noted   Muscle spasm 09/09/2020   S/P AKA (above knee amputation), right ([place]) 09/08/2020   Ischemia of extremity 07/02/2020   Chronic pain in right shoulder 11/25/2019   Encounter for long-term (current) use of high-risk medication 07/16/2019   GCA (giant cell arteritis) ([place]) 07/16/2019   Temporal arteritis ([place]) 07/13/2019   Postoperative wound infection 06/23/2019   Ischemic leg 05/31/2019   Atherosclerosis of native arteries of extremity with intermittent claudication ([place]) 05/30/2019   Depression 05/29/2019   Eczema of lower extremity 03/04/2018   Chronic venous insufficiency 03/02/2018   Personal history of colonic polyps    Family history of colonic  polyps    Benign neoplasm of ascending colon    Mitral valve insufficiency 02/08/2017   Dyspnea on exertion 02/07/2017   Non-rheumatic mitral regurgitation 02/07/2017   Precordial pain 02/07/2017   CAD (coronary artery disease) 12/21/2016   Essential hypertension 12/21/2016   Compression fracture of lumbar spine, non-traumatic ([place]) 04/26/2016   Compression fracture of L3 lumbar vertebra 04/20/2016   PVD (peripheral vascular disease) ([place]) 02/24/2016   Family history of premature CAD 02/24/2016   Gastritis    Other specified diseases of esophagus    Hiatal hernia    Gastritis and gastroduodenitis    Ischemia of lower extremity    Arterial occlusion (HCC)    Atherosclerosis of aorta (HCC)    History of smoking    Hyperlipidemia    Pain in the  chest    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 02/05/2016   Carotid artery stenosis 01/04/2016   Vertigo, benign paroxysmal 12/28/2015   Clinical depression 09/06/2015   History of alcoholism ([place]) 09/06/2015   Angiopathy, 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[place]) 09/06/2015   Atherosclerosis of native arteries of extremity with rest pain ([place]) 09/06/2015   Acute non-recurrent maxillary sinusitis 07/14/2015   Vaginal pruritus 05/26/2015   Chronic recurrent major depressive disorder ([place]) 03/09/2015   Osteoporosis, post-menopausal 03/09/2015   Peripheral blood vessel disorder ([place]) 03/09/2015   Acid reflux 03/09/2015   GERD (gastroesophageal reflux disease) 12/21/2014   Carotid artery narrowing 01/28/2014   Peripheral arterial occlusive disease ([place]) 06/08/2011   Occlusion and stenosis of unspecified carotid artery 06/08/2011   PAD (peripheral artery disease) ([place]) 06/08/2011   Phillips Grout PT, DPT, GCS  Aryon Nham, PT 09/05/2021, 4:48 PM  [place] [facility] [address] [place], [place], [address] Phone: [phone]   Fax:  [phone]  Name: VERDINE GRENFELL MRN: [id number] Date of Birth: 08-Oct-1950

## 2021-09-07 ENCOUNTER — Other Ambulatory Visit: Payer: Self-pay

## 2021-09-07 ENCOUNTER — Ambulatory Visit: Payer: Medicare Other

## 2021-09-07 DIAGNOSIS — G546 Phantom limb syndrome with pain: Secondary | ICD-10-CM | POA: Diagnosis not present

## 2021-09-07 DIAGNOSIS — R2681 Unsteadiness on feet: Secondary | ICD-10-CM | POA: Diagnosis not present

## 2021-09-07 DIAGNOSIS — M79604 Pain in right leg: Secondary | ICD-10-CM | POA: Diagnosis not present

## 2021-09-07 DIAGNOSIS — M6281 Muscle weakness (generalized): Secondary | ICD-10-CM

## 2021-09-07 DIAGNOSIS — R262 Difficulty in walking, not elsewhere classified: Secondary | ICD-10-CM

## 2021-09-07 DIAGNOSIS — I70213 Atherosclerosis of native arteries of extremities with intermittent claudication, bilateral legs: Secondary | ICD-10-CM | POA: Diagnosis not present

## 2021-09-07 NOTE — Therapy (Signed)
[facility] [address] [place], [place], [address] Phone: [phone]   Fax:  [phone]  Physical Therapy Treatment  Patient Details  Name: TARA RUD MRN: [id number] Date of Birth: [birth year] Referring Provider (PT): Dr. Delana Meyer   Encounter Date: 09/07/2021   PT End of Session - 09/07/21 1125     Visit Number 2    Number of Visits 25    Date for PT Re-Evaluation 11/28/21    Authorization Type eval: 09/05/21    PT Start Time 1100    PT Stop Time 1145    PT Time Calculation (min) 45 min    Equipment Utilized During Treatment Gait belt    Activity Tolerance Patient tolerated treatment well    Behavior During Therapy [facility] for tasks assessed/performed             Past Medical History:  Diagnosis Date   Arthritis    right shoulder   Depression    Dyspnea    GERD (gastroesophageal reflux disease)    History of blood clots    Hyperlipidemia    Hypertension    Mild mitral regurgitation    Osteoporosis    Peripheral vascular disease ([place])    Stomach ulcer    Vascular disease    Sees Dr. Delana Meyer   Vertigo    Last episode approx Aug 2015   Wears dentures    full upper    Past Surgical History:  Procedure Laterality Date   ADENOIDECTOMY     AMPUTATION Right 08/11/2020   Procedure: AMPUTATION ABOVE KNEE;  Surgeon: [name], MD;  Location: [facility];  Service: Vascular;  Laterality: Right;   ARTERY BIOPSY Right 07/14/2019   Procedure: BIOPSY TEMPORAL ARTERY;  Surgeon: [name], MD;  Location: [facility];  Service: Vascular;  Laterality: Right;   BREAST CYST ASPIRATION Left    CARDIAC CATHETERIZATION  02/15/2017   [facility]   COLONOSCOPY WITH PROPOFOL N/A 10/22/2017   Procedure: COLONOSCOPY WITH PROPOFOL;  Surgeon: Lucilla Lame, MD;  Location: [place];  Service: Endoscopy;  Laterality: N/A;  specimens not taken--pt on Plavix will be brought back in after 7 days off med   COLONOSCOPY WITH  PROPOFOL N/A 11/05/2017   Procedure: COLONOSCOPY WITH PROPOFOL;  Surgeon: Lucilla Lame, MD;  Location: [place];  Service: Endoscopy;  Laterality: N/A;   ESOPHAGOGASTRODUODENOSCOPY N/A 12/21/2014   Procedure: ESOPHAGOGASTRODUODENOSCOPY (EGD);  Surgeon: Lucilla Lame, MD;  Location: [place];  Service: Gastroenterology;  Laterality: N/A;   ESOPHAGOGASTRODUODENOSCOPY (EGD) WITH PROPOFOL N/A 02/08/2016   Procedure: ESOPHAGOGASTRODUODENOSCOPY (EGD) WITH PROPOFOL;  Surgeon: Lucilla Lame, MD;  Location: [facility];  Service: Endoscopy;  Laterality: N/A;   FASCIOTOMY Right 05/30/2019   Procedure: FASCIOTOMY;  Surgeon: [name], MD;  Location: [facility];  Service: Vascular;  Laterality: Right;   FASCIOTOMY CLOSURE Right 06/04/2019   Procedure: FASCIOTOMY CLOSURE;  Surgeon: [name], MD;  Location: [facility];  Service: Vascular;  Laterality: Right;   HEMORROIDECTOMY  2014   LOWER EXTREMITY ANGIOGRAPHY Right 05/30/2019   Procedure: LOWER EXTREMITY ANGIOGRAPHY;  Surgeon: [name], MD;  Location: [place] [facility];  Service: Cardiovascular;  Laterality: Right;   LOWER EXTREMITY ANGIOGRAPHY Right 07/02/2020   Procedure: LOWER EXTREMITY ANGIOGRAPHY;  Surgeon: [name], MD;  Location: [place] [facility];  Service: Cardiovascular;  Laterality: Right;   PERIPHERAL VASCULAR CATHETERIZATION N/A 02/09/2016   Procedure: Abdominal Aortogram w/Lower Extremity;  Surgeon: Belenda Cruise  Eloise Levels, MD;  Location: [place] [facility];  Service: Cardiovascular;  Laterality: N/A;   PERIPHERAL VASCULAR CATHETERIZATION Right 02/10/2016   Procedure: Lower Extremity Angiography;  Surgeon: [name], MD;  Location: [place] [facility];  Service: Cardiovascular;  Laterality: Right;   POLYPECTOMY  11/05/2017   Procedure: POLYPECTOMY;  Surgeon: Lucilla Lame, MD;  Location: [place];  Service: Endoscopy;;   SHOULDER ARTHROSCOPY WITH ROTATOR CUFF REPAIR AND  SUBACROMIAL DECOMPRESSION Right 02/27/2020   Procedure: RIGHT SHOULDER ARTHROSCOPY SUBACROMIAL DECOMPRESSION, DISTAL CLAVICLE EXCISION AND MINI-OPEN ROTATOR CUFF REPAIR;  Surgeon: Thornton Park, MD;  Location: [facility];  Service: Orthopedics;  Laterality: Right;   TONSILLECTOMY     VASCULAR SURGERY  [id number]   Fem-Pop Bypass    There were no vitals filed for this visit.   Subjective Assessment - 09/07/21 1104     Subjective Pt reports that she is doing well today. She denies any phantom pain upon arrival and states that she has been so busy over the last couple days that she hasn't really had any pain.  No specific questions upon arrival.    Pertinent History Pt reports slight worsening RLE phantom pain since ending therapy. She is taking gabapentin which helps with the pain however it is still very uncomfortable. Pain is continuous and is worse after extended walking with her prosthetic. She has not been performing her HEP after last discharge. She will get a suction fit socket for her RLE prosthesis this Friday.   History from 01/26/21: Pt has a history of RLE vascular issues since 1991. 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 She received her RLE prosthetic in April 2022 and has been receiving [facility] since her surgery. They discharged her yesterday to continue with OP PT. She continues with phantom RLE pain, especially at night and takes gabapentin with minimal relief. She has also been having R shoulder pain since the surgery which she attributes to using a wheelchair. She underwent R RTC surgery in September of 2021 and had physical therapy afterwards. She has been having issues with bilateral carpal tunnel and has a NCV study scheduled for tomorrow.   History from 01/26/21: Pt has a history of RLE vascular issues since 1991. 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 She received her RLE prosthetic in April 2022 and has  been receiving [facility] since her surgery. They discharged her yesterday to continue with OP PT. She continues with phantom RLE pain, especially at night and takes gabapentin with minimal relief. She has also been having R shoulder pain since the surgery which she attributes to using a wheelchair. She underwent R RTC surgery in September of 2021 and had physical therapy afterwards. She has been having issues with bilateral carpal tunnel and has a NCV study scheduled for tomorrow.    Limitations Walking    Diagnostic tests see history    Patient Stated Goals Ambulate with her prosthetic    Currently in Pain? No/denies    Pain Onset --             TREATMENT   Ther-ex NuStep L0-1 x 5 minutes BLE only for warm-up during interval history;  With prosthetic doffed: Supine R SLR hip flexion with manual resistance from therapist 2 x 20; Supine RLE bridge with residual RLE resting on stool (towel on top for cushion), arms at side, 3s hold, x 10, deferred second set due to  increase in distal RLE pain; Supine BLE bridge with residual RLE resting on stool (towel on top for cushion), arms at side, 3s hold x 10; Supine RLE long axis manually resisted external and internal rotation 2 x 20 each; L sidelying R hip abduction with manual resistance 2 x 20; L sidelying R hip extension from 90 hip flexion to available end range extension with manual resistance 2 x 20; Utilized traditional TENS in cross pattern with 2 channels to end of residual RLE with Empi continuum unit using default knee setting and pt tolerated intensity (17);   Manual Therapy  Light STM with effleurage to distal RLE for desensitization of residual limb;   Neuromuscular Re-education  Mirror therapy performed with patient sitting at edge of mat table and mirror between legs to simulate right lower extremity.  While watching the mirror with LLE patient performed seated marches x 1 minute with 4# ankle weight, long arc quads with 4# ankle  weight x 1 minute, L ankle AROM plantar flexion/dorsiflexion x 1 minute, L hip abduction/adduction marching, L ankle ball circles CW/CCW x 1 minute;   Pt educated throughout session about proper posture and technique with exercises. Improved exercise technique, movement at target joints, use of target muscles after min to mod verbal, visual, tactile cues.      Patient demonstrates excellent motivation during session today. Session today focused on using soft tissue mobilization to residual RLE for desensitization as well as electrical stimulation and strengthening exercises. Repeated mirror exercises to help with phantom limb pain. Will continue to progress manual therapy, strengthening, balance, and gait training (using new socket next week). Pt will benefit from PT services to address deficits in strength, balance, mobility, and pain in order to return to full function at home and decrease pain.                              PT Short Term Goals - 09/05/21 1215       PT SHORT TERM GOAL #1   Title Pt will be independent with HEP in order to improve strength and balance as well as decrase pain in order to decrease fall risk and improve pain-free function at home.    Time 6    Period Weeks    Status New    Target Date 10/17/21               PT Long Term Goals - 09/05/21 1217       PT LONG TERM GOAL #1   Title Pt will decrease worst RLE phantom pain by at least 3 points on the NPRS in order to improve pain-free function at home    Baseline 09/05/21: Worst: 7/10    Time 12    Period Weeks    Status New    Target Date 11/28/21      PT LONG TERM GOAL #2   Title Pt will improve ABC by at least 13% in order to demonstrate clinically significant improvement in balance confidence.    Baseline 09/05/21: To be completed    Time 12    Period Weeks    Status New    Target Date 11/28/21      PT LONG TERM GOAL #3   Title Pt will improve FOTO to at least 59 in order  to demonstrate clinically significant improvement in function related to her RLE amputation    Baseline 09/05/21: 53    Time 12  Period Weeks    Status New    Target Date 11/28/21                   Plan - 09/07/21 1126     Clinical Impression Statement Patient demonstrates excellent motivation during session today. Session today focused on using soft tissue mobilization to residual RLE for desensitization as well as electrical stimulation and strengthening exercises. Repeated mirror exercises to help with phantom limb pain. Will continue to progress manual therapy, strengthening, balance, and gait training (using new socket next week). Pt will benefit from PT services to address deficits in strength, balance, mobility, and pain in order to return to full function at home and decrease pain.    Personal Factors and Comorbidities Age;Comorbidity 3+    Comorbidities Depression, osteoporosis, vascular disease    Examination-Activity Limitations Bathing;Locomotion Level;Squat;Stairs;Stand;Transfers    Examination-Participation Restrictions Cleaning;Community Activity;Meal Prep;Yard Work    Stability/Clinical Decision Making Unstable/Unpredictable    Rehab Potential Good    PT Frequency 2x / week    PT Duration 12 weeks    PT Treatment/Interventions ADLs/Self Care Home Management;Aquatic Therapy;Canalith Repostioning;Cryotherapy;Electrical Stimulation;Iontophoresis 4mg /ml Dexamethasone;Moist Heat;Traction;Ultrasound;Gait training;Stair training;Therapeutic activities;Therapeutic exercise;Neuromuscular re-education;Manual techniques;Dry needling;Vestibular;Spinal Manipulations;Joint Manipulations;Balance training;Patient/family education;Cognitive remediation;Passive range of motion;Visual/perceptual remediation/compensation    PT Next Visit Plan mirror therapy, desensitization techniques (STM, superficial stimulation)    PT Home Exercise Plan Last episode access code: [email or web address]    Consulted  and Agree with Plan of Care Patient             Patient will benefit from skilled therapeutic intervention in order to improve the following deficits and impairments:  Abnormal gait, Decreased strength, Difficulty walking, Pain  Visit Diagnosis: Phantom limb syndrome with pain (HCC)  Muscle weakness (generalized)  Difficulty in walking, not elsewhere classified     Problem List Patient Active Problem List   Diagnosis Date Noted   Muscle spasm 09/09/2020   S/P AKA (above knee amputation), right ([place]) 09/08/2020   Ischemia of extremity 07/02/2020   Chronic pain in right shoulder 11/25/2019   Encounter for long-term (current) use of high-risk medication 07/16/2019   GCA (giant cell arteritis) ([place]) 07/16/2019   Temporal arteritis ([place]) 07/13/2019   Postoperative wound infection 06/23/2019   Ischemic leg 05/31/2019   Atherosclerosis of native arteries of extremity with intermittent claudication ([place]) 05/30/2019   Depression 05/29/2019   Eczema of lower extremity 03/04/2018   Chronic venous insufficiency 03/02/2018   Personal history of colonic polyps    Family history of colonic polyps    Benign neoplasm of ascending colon    Mitral valve insufficiency 02/08/2017   Dyspnea on exertion 02/07/2017   Non-rheumatic mitral regurgitation 02/07/2017   Precordial pain 02/07/2017   CAD (coronary artery disease) 12/21/2016   Essential hypertension 12/21/2016   Compression fracture of lumbar spine, non-traumatic ([place]) 04/26/2016   Compression fracture of L3 lumbar vertebra 04/20/2016   PVD (peripheral vascular disease) ([place]) 02/24/2016   Family history of premature CAD 02/24/2016   Gastritis    Other specified diseases of esophagus    Hiatal hernia    Gastritis and gastroduodenitis    Ischemia of lower extremity    Arterial occlusion (HCC)    Atherosclerosis of aorta (HCC)    History of smoking    Hyperlipidemia    Pain in the chest    Nontraumatic ischemic infarction of  muscle of right lower leg 02/05/2016   Carotid artery stenosis 01/04/2016   Vertigo, benign paroxysmal 12/28/2015   Clinical  depression 09/06/2015   History of alcoholism ([place]) 09/06/2015   Angiopathy, 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[place]) 09/06/2015   Atherosclerosis of native arteries of extremity with rest pain ([place]) 09/06/2015   Acute non-recurrent maxillary sinusitis 07/14/2015   Vaginal pruritus 05/26/2015   Chronic recurrent major depressive disorder ([place]) 03/09/2015   Osteoporosis, post-menopausal 03/09/2015   Peripheral blood vessel disorder ([place]) 03/09/2015   Acid reflux 03/09/2015   GERD (gastroesophageal reflux disease) 12/21/2014   Carotid artery narrowing 01/28/2014   Peripheral arterial occlusive disease ([place]) 06/08/2011   Occlusion and stenosis of unspecified carotid artery 06/08/2011   PAD (peripheral artery disease) ([place]) 06/08/2011   Phillips Grout PT, DPT, GCS  Tekoa Amon, PT 09/07/2021, 11:59 AM  [facility] [address] [place], [place], [address] Phone: [phone]   Fax:  [phone]  Name: SHARECE FLEISCHHACKER MRN: [id number] Date of Birth: 15-Aug-1951

## 2021-09-09 ENCOUNTER — Telehealth (INDEPENDENT_AMBULATORY_CARE_PROVIDER_SITE_OTHER): Payer: Self-pay

## 2021-09-09 ENCOUNTER — Other Ambulatory Visit (INDEPENDENT_AMBULATORY_CARE_PROVIDER_SITE_OTHER): Payer: Self-pay | Admitting: Nurse Practitioner

## 2021-09-09 DIAGNOSIS — I779 Disorder of arteries and arterioles, unspecified: Secondary | ICD-10-CM

## 2021-09-09 MED ORDER — APIXABAN 5 MG PO TABS: 5.0000 mg | ORAL_TABLET | Freq: Two times a day (BID) | ORAL | 0 refills | Status: DC

## 2021-09-09 NOTE — Telephone Encounter (Signed)
Sent!

## 2021-09-09 NOTE — Telephone Encounter (Signed)
Pt called and left a VM on the nurses line wanting a 30 day supply of elliquis. Please advise.

## 2021-09-12 ENCOUNTER — Other Ambulatory Visit: Payer: Self-pay

## 2021-09-12 ENCOUNTER — Ambulatory Visit: Payer: Medicare Other

## 2021-09-12 DIAGNOSIS — R2681 Unsteadiness on feet: Secondary | ICD-10-CM

## 2021-09-12 DIAGNOSIS — I70213 Atherosclerosis of native arteries of extremities with intermittent claudication, bilateral legs: Secondary | ICD-10-CM | POA: Diagnosis not present

## 2021-09-12 DIAGNOSIS — M6281 Muscle weakness (generalized): Secondary | ICD-10-CM

## 2021-09-12 DIAGNOSIS — M79604 Pain in right leg: Secondary | ICD-10-CM | POA: Diagnosis not present

## 2021-09-12 DIAGNOSIS — G546 Phantom limb syndrome with pain: Secondary | ICD-10-CM | POA: Diagnosis not present

## 2021-09-12 DIAGNOSIS — R262 Difficulty in walking, not elsewhere classified: Secondary | ICD-10-CM

## 2021-09-12 NOTE — Therapy (Signed)
[facility] [address] [place], [place], [address] Phone: [phone]   Fax:  [phone]  Physical Therapy Treatment  Patient Details  Name: Sara [name] MRN: [id number] Date of Birth: February 02, 1951 Referring Provider (PT): Dr. Delana Meyer   Encounter Date: 09/12/2021   PT End of Session - 09/12/21 1036     Visit Number 3    Number of Visits 25    Date for PT Re-Evaluation 11/28/21    Authorization Type eval: 09/05/21    PT Start Time 1020    PT Stop Time 1105    PT Time Calculation (min) 45 min    Equipment Utilized During Treatment Gait belt    Activity Tolerance Patient tolerated treatment well    Behavior During Therapy [facility] for tasks assessed/performed              Past Medical History:  Diagnosis Date   Arthritis    right shoulder   Depression    Dyspnea    GERD (gastroesophageal reflux disease)    History of blood clots    Hyperlipidemia    Hypertension    Mild mitral regurgitation    Osteoporosis    Peripheral vascular disease ([place])    Stomach ulcer    Vascular disease    Sees Dr. Delana Meyer   Vertigo    Last episode approx Aug 2015   Wears dentures    full upper    Past Surgical History:  Procedure Laterality Date   ADENOIDECTOMY     AMPUTATION Right 08/11/2020   Procedure: AMPUTATION ABOVE KNEE;  Surgeon: [name], MD;  Location: [facility];  Service: Vascular;  Laterality: Right;   ARTERY BIOPSY Right 07/14/2019   Procedure: BIOPSY TEMPORAL ARTERY;  Surgeon: [name], MD;  Location: [facility];  Service: Vascular;  Laterality: Right;   BREAST CYST ASPIRATION Left    CARDIAC CATHETERIZATION  02/15/2017   [facility]   COLONOSCOPY WITH PROPOFOL N/A 10/22/2017   Procedure: COLONOSCOPY WITH PROPOFOL;  Surgeon: Lucilla Lame, MD;  Location: [place];  Service: Endoscopy;  Laterality: N/A;  specimens not taken--pt on Plavix will be brought back in after 7 days off med   COLONOSCOPY WITH  PROPOFOL N/A 11/05/2017   Procedure: COLONOSCOPY WITH PROPOFOL;  Surgeon: Lucilla Lame, MD;  Location: [place];  Service: Endoscopy;  Laterality: N/A;   ESOPHAGOGASTRODUODENOSCOPY N/A 12/21/2014   Procedure: ESOPHAGOGASTRODUODENOSCOPY (EGD);  Surgeon: Lucilla Lame, MD;  Location: [place];  Service: Gastroenterology;  Laterality: N/A;   ESOPHAGOGASTRODUODENOSCOPY (EGD) WITH PROPOFOL N/A 02/08/2016   Procedure: ESOPHAGOGASTRODUODENOSCOPY (EGD) WITH PROPOFOL;  Surgeon: Lucilla Lame, MD;  Location: [facility];  Service: Endoscopy;  Laterality: N/A;   FASCIOTOMY Right 05/30/2019   Procedure: FASCIOTOMY;  Surgeon: [name], MD;  Location: [facility];  Service: Vascular;  Laterality: Right;   FASCIOTOMY CLOSURE Right 06/04/2019   Procedure: FASCIOTOMY CLOSURE;  Surgeon: [name], MD;  Location: [facility];  Service: Vascular;  Laterality: Right;   HEMORROIDECTOMY  2014   LOWER EXTREMITY ANGIOGRAPHY Right 05/30/2019   Procedure: LOWER EXTREMITY ANGIOGRAPHY;  Surgeon: [name], MD;  Location: [place] [facility];  Service: Cardiovascular;  Laterality: Right;   LOWER EXTREMITY ANGIOGRAPHY Right 07/02/2020   Procedure: LOWER EXTREMITY ANGIOGRAPHY;  Surgeon: [name], MD;  Location: [place] [facility];  Service: Cardiovascular;  Laterality: Right;   PERIPHERAL VASCULAR CATHETERIZATION N/A 02/09/2016   Procedure: Abdominal Aortogram w/Lower Extremity;  Surgeon:  [name], MD;  Location: [place] [facility];  Service: Cardiovascular;  Laterality: N/A;   PERIPHERAL VASCULAR CATHETERIZATION Right 02/10/2016   Procedure: Lower Extremity Angiography;  Surgeon: [name], MD;  Location: [place] [facility];  Service: Cardiovascular;  Laterality: Right;   POLYPECTOMY  11/05/2017   Procedure: POLYPECTOMY;  Surgeon: Lucilla Lame, MD;  Location: [place];  Service: Endoscopy;;   SHOULDER ARTHROSCOPY WITH ROTATOR CUFF REPAIR AND  SUBACROMIAL DECOMPRESSION Right 02/27/2020   Procedure: RIGHT SHOULDER ARTHROSCOPY SUBACROMIAL DECOMPRESSION, DISTAL CLAVICLE EXCISION AND MINI-OPEN ROTATOR CUFF REPAIR;  Surgeon: [place] Park, MD;  Location: [facility];  Service: Orthopedics;  Laterality: Right;   TONSILLECTOMY     VASCULAR SURGERY  [id number]   Fem-Pop Bypass    There were no vitals filed for this visit.   Subjective Assessment - 09/12/21 1035     Subjective Pt reports that she is doing well today. She denies any phantom pain upon arrival. She got her new prosthetic suction fit socket on Friday and likes it but is still learning how to best don and doff it.    Pertinent History Pt reports slight worsening RLE phantom pain since ending therapy. She is taking gabapentin which helps with the pain however it is still very uncomfortable. Pain is continuous and is worse after extended walking with her prosthetic. She has not been performing her HEP after last discharge. She will get a suction fit socket for her RLE prosthesis this Friday.   History from 01/26/21: Pt has a history of RLE vascular issues since 1991. 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 She received her RLE prosthetic in April 2022 and has been receiving [facility] since her surgery. They discharged her yesterday to continue with OP PT. She continues with phantom RLE pain, especially at night and takes gabapentin with minimal relief. She has also been having R shoulder pain since the surgery which she attributes to using a wheelchair. She underwent R RTC surgery in September of 2021 and had physical therapy afterwards. She has been having issues with bilateral carpal tunnel and has a NCV study scheduled for tomorrow.   History from 01/26/21: Pt has a history of RLE vascular issues since 1991. 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 She received her RLE prosthetic in April 2022 and has been receiving [facility] since her surgery. They discharged her yesterday to continue with OP PT. She continues with phantom RLE pain, especially at night and takes gabapentin with minimal relief. She has also been having R shoulder pain since the surgery which she attributes to using a wheelchair. She underwent R RTC surgery in September of 2021 and had physical therapy afterwards. She has been having issues with bilateral carpal tunnel and has a NCV study scheduled for tomorrow.    Limitations Walking    Diagnostic tests see history    Patient Stated Goals Ambulate with her prosthetic    Currently in Pain? No/denies                TREATMENT   Ther-ex With prosthetic doffed: Supine R SLR hip flexion with manual resistance from therapist 2 x 20; Supine BLE bridge with residual RLE resting on stool (towel on top for cushion), arms at side, 3s hold x 10; Supine RLE long axis manually resisted external and internal rotation 2 x 20 each; Supine RLE manually resisted adduction 2 x 20;  L sidelying R hip abduction with manual resistance 2 x 20; L sidelying R hip extension from 90 hip flexion to available end range extension with manual resistance 2 x 20; Gait in rehab gym and out to vehicle with CGA from therapist and no assistive device;   Manual Therapy  Light STM with effleurage to distal RLE for desensitization of residual limb;   Neuromuscular Re-education  Mirror therapy performed with patient sitting at edge of mat table and mirror between legs to simulate right lower extremity.  While watching the mirror with LLE patient performed seated marches x 1 minute with 3# ankle weight, long arc quads with 3# ankle weight x 1 minute, L ankle AROM plantar flexion/dorsiflexion x 1 minute, L hip abduction/adduction marching x 1 minute, L ankle ball circles CW/CCW x 1 minute;   Pt educated throughout session about proper posture and technique with exercises. Improved exercise technique, movement at target joints,  use of target muscles after min to mod verbal, visual, tactile cues.      Patient demonstrates excellent motivation during session today. Session today focused on using soft tissue mobilization to residual RLE for desensitization as well as strengthening exercises. Repeated mirror exercises to help with phantom limb pain. Practicing donning/doffing her new vacuum fit socket and practiced ambulation without assistive device. Will continue to progress manual therapy, strengthening, balance, and gait training. Pt will benefit from PT services to address deficits in strength, balance, mobility, and pain in order to return to full function at home and decrease pain.                              PT Short Term Goals - 09/05/21 1215       PT SHORT TERM GOAL #1   Title Pt will be independent with HEP in order to improve strength and balance as well as decrase pain in order to decrease fall risk and improve pain-free function at home.    Time 6    Period Weeks    Status New    Target Date 10/17/21               PT Long Term Goals - 09/05/21 1217       PT LONG TERM GOAL #1   Title Pt will decrease worst RLE phantom pain by at least 3 points on the NPRS in order to improve pain-free function at home    Baseline 09/05/21: Worst: 7/10    Time 12    Period Weeks    Status New    Target Date 11/28/21      PT LONG TERM GOAL #2   Title Pt will improve ABC by at least 13% in order to demonstrate clinically significant improvement in balance confidence.    Baseline 09/05/21: To be completed    Time 12    Period Weeks    Status New    Target Date 11/28/21      PT LONG TERM GOAL #3   Title Pt will improve FOTO to at least 59 in order to demonstrate clinically significant improvement in function related to her RLE amputation    Baseline 09/05/21: 53    Time 12    Period Weeks    Status New    Target Date 11/28/21                   Plan - 09/12/21 1036      Clinical  Impression Statement Patient demonstrates excellent motivation during session today. Session today focused on using soft tissue mobilization to residual RLE for desensitization as well as strengthening exercises. Repeated mirror exercises to help with phantom limb pain. Practicing donning/doffing her new vacuum fit socket and practiced ambulation without assistive device. Will continue to progress manual therapy, strengthening, balance, and gait training. Pt will benefit from PT services to address deficits in strength, balance, mobility, and pain in order to return to full function at home and decrease pain.    Personal Factors and Comorbidities Age;Comorbidity 3+    Comorbidities Depression, osteoporosis, vascular disease    Examination-Activity Limitations Bathing;Locomotion Level;Squat;Stairs;Stand;Transfers    Examination-Participation Restrictions Cleaning;Community Activity;Meal Prep;Yard Work    Stability/Clinical Decision Making Unstable/Unpredictable    Rehab Potential Good    PT Frequency 2x / week    PT Duration 12 weeks    PT Treatment/Interventions ADLs/Self Care Home Management;Aquatic Therapy;Canalith Repostioning;Cryotherapy;Electrical Stimulation;Iontophoresis 4mg /ml Dexamethasone;Moist Heat;Traction;Ultrasound;Gait training;Stair training;Therapeutic activities;Therapeutic exercise;Neuromuscular re-education;Manual techniques;Dry needling;Vestibular;Spinal Manipulations;Joint Manipulations;Balance training;Patient/family education;Cognitive remediation;Passive range of motion;Visual/perceptual remediation/compensation    PT Next Visit Plan mirror therapy, desensitization techniques (STM, superficial stimulation)    PT Home Exercise Plan Last episode access code: [email or web address]    Consulted and Agree with Plan of Care Patient              Patient will benefit from skilled therapeutic intervention in order to improve the following deficits and impairments:  Abnormal gait,  Decreased strength, Difficulty walking, Pain  Visit Diagnosis: Muscle weakness (generalized)  Difficulty in walking, not elsewhere classified  Unsteadiness on feet     Problem List Patient Active Problem List   Diagnosis Date Noted   Muscle spasm 09/09/2020   S/P AKA (above knee amputation), right ([place]) 09/08/2020   Ischemia of extremity 07/02/2020   Chronic pain in right shoulder 11/25/2019   Encounter for long-term (current) use of high-risk medication 07/16/2019   GCA (giant cell arteritis) ([place]) 07/16/2019   Temporal arteritis ([place]) 07/13/2019   Postoperative wound infection 06/23/2019   Ischemic leg 05/31/2019   Atherosclerosis of native arteries of extremity with intermittent claudication ([place]) 05/30/2019   Depression 05/29/2019   Eczema of lower extremity 03/04/2018   Chronic venous insufficiency 03/02/2018   Personal history of colonic polyps    Family history of colonic polyps    Benign neoplasm of ascending colon    Mitral valve insufficiency 02/08/2017   Dyspnea on exertion 02/07/2017   Non-rheumatic mitral regurgitation 02/07/2017   Precordial pain 02/07/2017   CAD (coronary artery disease) 12/21/2016   Essential hypertension 12/21/2016   Compression fracture of lumbar spine, non-traumatic ([place]) 04/26/2016   Compression fracture of L3 lumbar vertebra 04/20/2016   PVD (peripheral vascular disease) ([place]) 02/24/2016   Family history of premature CAD 02/24/2016   Gastritis    Other specified diseases of esophagus    Hiatal hernia    Gastritis and gastroduodenitis    Ischemia of lower extremity    Arterial occlusion (H[facility])    Atherosclerosis of aorta ([place])    History of smoking    Hyperlipidemia    Pain in the chest    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 02/05/2016   Carotid artery stenosis 01/04/2016   Vertigo, benign paroxysmal 12/28/2015   Clinical depression 09/06/2015   History of alcoholism ([place]) 09/06/2015   Angiopathy,  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[place]) 09/06/2015   Atherosclerosis of native arteries of extremity with rest pain ([place]) 09/06/2015   Acute non-recurrent maxillary sinusitis 07/14/2015  Vaginal pruritus 05/26/2015   Chronic recurrent major depressive disorder ([place]) 03/09/2015   Osteoporosis, post-menopausal 03/09/2015   Peripheral blood vessel disorder ([place]) 03/09/2015   Acid reflux 03/09/2015   GERD (gastroesophageal reflux disease) 12/21/2014   Carotid artery narrowing 01/28/2014   Peripheral arterial occlusive disease ([place]) 06/08/2011   Occlusion and stenosis of unspecified carotid artery 06/08/2011   PAD (peripheral artery disease) ([place]) 06/08/2011   Phillips Grout PT, DPT, GCS  Jaclynne Baldo, PT 09/12/2021, 3:28 PM  [facility] [address] [place], [place], [address] Phone: [phone]   Fax:  [phone]  Name: Sara [name] MRN: [id number] Date of Birth: 04/18/1951

## 2021-09-13 DIAGNOSIS — I340 Nonrheumatic mitral (valve) insufficiency: Secondary | ICD-10-CM | POA: Diagnosis not present

## 2021-09-13 DIAGNOSIS — E785 Hyperlipidemia, unspecified: Secondary | ICD-10-CM | POA: Diagnosis not present

## 2021-09-13 DIAGNOSIS — I739 Peripheral vascular disease, unspecified: Secondary | ICD-10-CM | POA: Diagnosis not present

## 2021-09-13 DIAGNOSIS — I10 Essential (primary) hypertension: Secondary | ICD-10-CM | POA: Diagnosis not present

## 2021-09-13 DIAGNOSIS — I351 Nonrheumatic aortic (valve) insufficiency: Secondary | ICD-10-CM | POA: Diagnosis not present

## 2021-09-13 DIAGNOSIS — I2510 Atherosclerotic heart disease of native coronary artery without angina pectoris: Secondary | ICD-10-CM | POA: Diagnosis not present

## 2021-09-14 ENCOUNTER — Other Ambulatory Visit: Payer: Self-pay

## 2021-09-14 ENCOUNTER — Ambulatory Visit: Payer: Medicare Other

## 2021-09-14 DIAGNOSIS — I70213 Atherosclerosis of native arteries of extremities with intermittent claudication, bilateral legs: Secondary | ICD-10-CM | POA: Diagnosis not present

## 2021-09-14 DIAGNOSIS — M79604 Pain in right leg: Secondary | ICD-10-CM

## 2021-09-14 DIAGNOSIS — R262 Difficulty in walking, not elsewhere classified: Secondary | ICD-10-CM | POA: Diagnosis not present

## 2021-09-14 DIAGNOSIS — M6281 Muscle weakness (generalized): Secondary | ICD-10-CM

## 2021-09-14 DIAGNOSIS — G546 Phantom limb syndrome with pain: Secondary | ICD-10-CM | POA: Diagnosis not present

## 2021-09-14 DIAGNOSIS — R2681 Unsteadiness on feet: Secondary | ICD-10-CM | POA: Diagnosis not present

## 2021-09-15 NOTE — Therapy (Signed)
[facility] [address] [place], [place], [address] Phone: [phone]   Fax:  [phone]  Physical Therapy Treatment  Patient Details  Name: Sara [name] MRN: [id number] Date of Birth: 1950/09/04 Referring Provider (PT): Dr. Delana Meyer   Encounter Date: 09/14/2021   PT End of Session - 09/15/21 1220     Visit Number 4    Number of Visits 25    Date for PT Re-Evaluation 11/28/21    Authorization Type eval: 09/05/21    PT Start Time 1020    PT Stop Time 1100    PT Time Calculation (min) 40 min    Equipment Utilized During Treatment Gait belt    Activity Tolerance Patient tolerated treatment well    Behavior During Therapy [facility] for tasks assessed/performed              Past Medical History:  Diagnosis Date   Arthritis    right shoulder   Depression    Dyspnea    GERD (gastroesophageal reflux disease)    History of blood clots    Hyperlipidemia    Hypertension    Mild mitral regurgitation    Osteoporosis    Peripheral vascular disease ([place])    Stomach ulcer    Vascular disease    Sees Dr. Delana Meyer   Vertigo    Last episode approx Aug 2015   Wears dentures    full upper    Past Surgical History:  Procedure Laterality Date   ADENOIDECTOMY     AMPUTATION Right 08/11/2020   Procedure: AMPUTATION ABOVE KNEE;  Surgeon: [name], MD;  Location: [facility];  Service: Vascular;  Laterality: Right;   ARTERY BIOPSY Right 07/14/2019   Procedure: BIOPSY TEMPORAL ARTERY;  Surgeon: [name], MD;  Location: [facility];  Service: Vascular;  Laterality: Right;   BREAST CYST ASPIRATION Left    CARDIAC CATHETERIZATION  02/15/2017   [facility]   COLONOSCOPY WITH PROPOFOL N/A 10/22/2017   Procedure: COLONOSCOPY WITH PROPOFOL;  Surgeon: Lucilla Lame, MD;  Location: [place];  Service: Endoscopy;  Laterality: N/A;  specimens not taken--pt on Plavix will be brought back in after 7 days off med   COLONOSCOPY WITH  PROPOFOL N/A 11/05/2017   Procedure: COLONOSCOPY WITH PROPOFOL;  Surgeon: Lucilla Lame, MD;  Location: [place];  Service: Endoscopy;  Laterality: N/A;   ESOPHAGOGASTRODUODENOSCOPY N/A 12/21/2014   Procedure: ESOPHAGOGASTRODUODENOSCOPY (EGD);  Surgeon: Lucilla Lame, MD;  Location: [place];  Service: Gastroenterology;  Laterality: N/A;   ESOPHAGOGASTRODUODENOSCOPY (EGD) WITH PROPOFOL N/A 02/08/2016   Procedure: ESOPHAGOGASTRODUODENOSCOPY (EGD) WITH PROPOFOL;  Surgeon: Lucilla Lame, MD;  Location: [facility];  Service: Endoscopy;  Laterality: N/A;   FASCIOTOMY Right 05/30/2019   Procedure: FASCIOTOMY;  Surgeon: [name], MD;  Location: [facility];  Service: Vascular;  Laterality: Right;   FASCIOTOMY CLOSURE Right 06/04/2019   Procedure: FASCIOTOMY CLOSURE;  Surgeon: [name], MD;  Location: [facility];  Service: Vascular;  Laterality: Right;   HEMORROIDECTOMY  2014   LOWER EXTREMITY ANGIOGRAPHY Right 05/30/2019   Procedure: LOWER EXTREMITY ANGIOGRAPHY;  Surgeon: [name], MD;  Location: [place] [facility];  Service: Cardiovascular;  Laterality: Right;   LOWER EXTREMITY ANGIOGRAPHY Right 07/02/2020   Procedure: LOWER EXTREMITY ANGIOGRAPHY;  Surgeon: [name], MD;  Location: [place] [facility];  Service: Cardiovascular;  Laterality: Right;   PERIPHERAL VASCULAR CATHETERIZATION N/A 02/09/2016   Procedure: Abdominal Aortogram w/Lower Extremity;  Surgeon:  [name], MD;  Location: [place] [facility];  Service: Cardiovascular;  Laterality: N/A;   PERIPHERAL VASCULAR CATHETERIZATION Right 02/10/2016   Procedure: Lower Extremity Angiography;  Surgeon: [name], MD;  Location: [place] [facility];  Service: Cardiovascular;  Laterality: Right;   POLYPECTOMY  11/05/2017   Procedure: POLYPECTOMY;  Surgeon: Lucilla Lame, MD;  Location: [place];  Service: Endoscopy;;   SHOULDER ARTHROSCOPY WITH ROTATOR CUFF REPAIR AND  SUBACROMIAL DECOMPRESSION Right 02/27/2020   Procedure: RIGHT SHOULDER ARTHROSCOPY SUBACROMIAL DECOMPRESSION, DISTAL CLAVICLE EXCISION AND MINI-OPEN ROTATOR CUFF REPAIR;  Surgeon: Thornton Park, MD;  Location: [facility];  Service: Orthopedics;  Laterality: Right;   TONSILLECTOMY     VASCULAR SURGERY  [id number]   Fem-Pop Bypass    There were no vitals filed for this visit.   Subjective Assessment - 09/15/21 1219     Subjective Pt reports that she is doing well today. She denies any phantom pain upon arrival but was having some last night.  She is still practicing donning and doffing her suction fit prosthetic socket.    Pertinent History Pt reports slight worsening RLE phantom pain since ending therapy. She is taking gabapentin which helps with the pain however it is still very uncomfortable. Pain is continuous and is worse after extended walking with her prosthetic. She has not been performing her HEP after last discharge. She will get a suction fit socket for her RLE prosthesis this Friday.   History from 01/26/21: Pt has a history of RLE vascular issues since 1991. 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 She received her RLE prosthetic in April 2022 and has been receiving [facility] since her surgery. They discharged her yesterday to continue with OP PT. She continues with phantom RLE pain, especially at night and takes gabapentin with minimal relief. She has also been having R shoulder pain since the surgery which she attributes to using a wheelchair. She underwent R RTC surgery in September of 2021 and had physical therapy afterwards. She has been having issues with bilateral carpal tunnel and has a NCV study scheduled for tomorrow.   History from 01/26/21: Pt has a history of RLE vascular issues since 1991. 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 She received her RLE prosthetic in April 2022 and has been receiving [facility] since  her surgery. They discharged her yesterday to continue with OP PT. She continues with phantom RLE pain, especially at night and takes gabapentin with minimal relief. She has also been having R shoulder pain since the surgery which she attributes to using a wheelchair. She underwent R RTC surgery in September of 2021 and had physical therapy afterwards. She has been having issues with bilateral carpal tunnel and has a NCV study scheduled for tomorrow.    Limitations Walking    Diagnostic tests see history    Patient Stated Goals Ambulate with her prosthetic    Currently in Pain? No/denies                TREATMENT   Ther-ex With prosthetic doffed: Supine R SLR hip flexion with manual resistance from therapist 2 x 20; Supine BLE bridge with residual RLE resting on stool (towel on top for cushion), arms at side, 3s hold x 10; Supine RLE long axis manually resisted external and internal rotation 2 x 20 each; Supine RLE manually resisted adduction 2 x 20; L sidelying R hip abduction  with manual resistance 2 x 20; L sidelying R hip extension from 90 hip flexion to available end range extension with manual resistance 2 x 20; Gait in rehab gym and out to vehicle with CGA from therapist and no assistive device;   Neuromuscular Re-education  Mirror therapy performed with patient sitting at edge of mat table and mirror between legs to simulate right lower extremity.  While watching the mirror with LLE patient performed seated marches x 1 minute with 3# ankle weight, long arc quads with 3# ankle weight x 1 minute, L ankle AROM plantar flexion/dorsiflexion x 1 minute, L hip abduction/adduction marching x 1 minute, L ankle ball circles CW/CCW x 1 minute;   Electrical Stimulation  Applied high rate modulated TENS to distal residual RLE in 2 channel cross pattern using Empi Continuum unit at default knee setting to help with phantom limb pain. Utilized pt tolerated intensity of 9 bilaterally x 5  minutes followed by an additional 15 minutes during mat table strengthening. No visible skin irritation following removal of electrodes;    Pt educated throughout session about proper posture and technique with exercises. Improved exercise technique, movement at target joints, use of target muscles after min to mod verbal, visual, tactile cues.      Patient demonstrates excellent motivation during session today. Session today focused on strengthening exercises and neuromuscular re-education. Repeated mirror exercises to help with phantom limb pain and also utilized high rate TENS on distal residual RLE. Will continue to progress manual therapy, strengthening, balance, and gait training. Pt will benefit from PT services to address deficits in strength, balance, mobility, and pain in order to return to full function at home and decrease pain.                              PT Short Term Goals - 09/05/21 1215       PT SHORT TERM GOAL #1   Title Pt will be independent with HEP in order to improve strength and balance as well as decrase pain in order to decrease fall risk and improve pain-free function at home.    Time 6    Period Weeks    Status New    Target Date 10/17/21               PT Long Term Goals - 09/05/21 1217       PT LONG TERM GOAL #1   Title Pt will decrease worst RLE phantom pain by at least 3 points on the NPRS in order to improve pain-free function at home    Baseline 09/05/21: Worst: 7/10    Time 12    Period Weeks    Status New    Target Date 11/28/21      PT LONG TERM GOAL #2   Title Pt will improve ABC by at least 13% in order to demonstrate clinically significant improvement in balance confidence.    Baseline 09/05/21: To be completed    Time 12    Period Weeks    Status New    Target Date 11/28/21      PT LONG TERM GOAL #3   Title Pt will improve FOTO to at least 59 in order to demonstrate clinically significant improvement in  function related to her RLE amputation    Baseline 09/05/21: 53    Time 12    Period Weeks    Status New    Target Date  11/28/21                   Plan - 09/15/21 1221     Clinical Impression Statement Patient demonstrates excellent motivation during session today. Session today focused on strengthening exercises and neuromuscular re-education. Repeated mirror exercises to help with phantom limb pain and also utilized high rate TENS on distal residual RLE. Will continue to progress manual therapy, strengthening, balance, and gait training. Pt will benefit from PT services to address deficits in strength, balance, mobility, and pain in order to return to full function at home and decrease pain.    Personal Factors and Comorbidities Age;Comorbidity 3+    Comorbidities Depression, osteoporosis, vascular disease    Examination-Activity Limitations Bathing;Locomotion Level;Squat;Stairs;Stand;Transfers    Examination-Participation Restrictions Cleaning;Community Activity;Meal Prep;Yard Work    Stability/Clinical Decision Making Unstable/Unpredictable    Rehab Potential Good    PT Frequency 2x / week    PT Duration 12 weeks    PT Treatment/Interventions ADLs/Self Care Home Management;Aquatic Therapy;Canalith Repostioning;Cryotherapy;Electrical Stimulation;Iontophoresis 4mg /ml Dexamethasone;Moist Heat;Traction;Ultrasound;Gait training;Stair training;Therapeutic activities;Therapeutic exercise;Neuromuscular re-education;Manual techniques;Dry needling;Vestibular;Spinal Manipulations;Joint Manipulations;Balance training;Patient/family education;Cognitive remediation;Passive range of motion;Visual/perceptual remediation/compensation    PT Next Visit Plan complete ABC, mirror therapy, desensitization techniques (STM, superficial stimulation)    PT Home Exercise Plan Last episode access code: [email or web address]    Consulted and Agree with Plan of Care Patient              Patient will benefit from  skilled therapeutic intervention in order to improve the following deficits and impairments:  Abnormal gait, Decreased strength, Difficulty walking, Pain  Visit Diagnosis: Muscle weakness (generalized)  Pain in right leg  Difficulty in walking, not elsewhere classified     Problem List Patient Active Problem List   Diagnosis Date Noted   Muscle spasm 09/09/2020   S/P AKA (above knee amputation), right ([place]) 09/08/2020   Ischemia of extremity 07/02/2020   Chronic pain in right shoulder 11/25/2019   Encounter for long-term (current) use of high-risk medication 07/16/2019   GCA (giant cell arteritis) ([place]) 07/16/2019   Temporal arteritis ([place]) 07/13/2019   Postoperative wound infection 06/23/2019   Ischemic leg 05/31/2019   Atherosclerosis of native arteries of extremity with intermittent claudication ([place]) 05/30/2019   Depression 05/29/2019   Eczema of lower extremity 03/04/2018   Chronic venous insufficiency 03/02/2018   Personal history of colonic polyps    Family history of colonic polyps    Benign neoplasm of ascending colon    Mitral valve insufficiency 02/08/2017   Dyspnea on exertion 02/07/2017   Non-rheumatic mitral regurgitation 02/07/2017   Precordial pain 02/07/2017   CAD (coronary artery disease) 12/21/2016   Essential hypertension 12/21/2016   Compression fracture of lumbar spine, non-traumatic ([place]) 04/26/2016   Compression fracture of L3 lumbar vertebra 04/20/2016   PVD (peripheral vascular disease) ([place]) 02/24/2016   Family history of premature CAD 02/24/2016   Gastritis    Other specified diseases of esophagus    Hiatal hernia    Gastritis and gastroduodenitis    Ischemia of lower extremity    Arterial occlusion (HCC)    Atherosclerosis of aorta (HCC)    History of smoking    Hyperlipidemia    Pain in the chest    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 02/05/2016   Carotid artery stenosis 01/04/2016   Vertigo, benign  paroxysmal 12/28/2015   Clinical depression 09/06/2015   History of alcoholism ([place]) 09/06/2015   Angiopathy, 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[place]) 09/06/2015   Atherosclerosis  of native arteries of extremity with rest pain ([place]) 09/06/2015   Acute non-recurrent maxillary sinusitis 07/14/2015   Vaginal pruritus 05/26/2015   Chronic recurrent major depressive disorder ([place]) 03/09/2015   Osteoporosis, post-menopausal 03/09/2015   Peripheral blood vessel disorder ([place]) 03/09/2015   Acid reflux 03/09/2015   GERD (gastroesophageal reflux disease) 12/21/2014   Carotid artery narrowing 01/28/2014   Peripheral arterial occlusive disease ([place]) 06/08/2011   Occlusion and stenosis of unspecified carotid artery 06/08/2011   PAD (peripheral artery disease) ([place]) 06/08/2011   Phillips Grout PT, DPT, GCS  Danielle Lento, PT 09/15/2021, 12:27 PM  [facility] [address] [place], [place], [address] Phone: [phone]   Fax:  [phone]  Name: Sara [name] MRN: [id number] Date of Birth: 1951/04/30

## 2021-09-19 ENCOUNTER — Ambulatory Visit: Payer: Medicare Other

## 2021-09-19 ENCOUNTER — Other Ambulatory Visit: Payer: Self-pay

## 2021-09-19 DIAGNOSIS — M6281 Muscle weakness (generalized): Secondary | ICD-10-CM | POA: Diagnosis not present

## 2021-09-19 DIAGNOSIS — G546 Phantom limb syndrome with pain: Secondary | ICD-10-CM

## 2021-09-19 DIAGNOSIS — M79604 Pain in right leg: Secondary | ICD-10-CM

## 2021-09-19 DIAGNOSIS — R2681 Unsteadiness on feet: Secondary | ICD-10-CM | POA: Diagnosis not present

## 2021-09-19 DIAGNOSIS — I70213 Atherosclerosis of native arteries of extremities with intermittent claudication, bilateral legs: Secondary | ICD-10-CM | POA: Diagnosis not present

## 2021-09-19 DIAGNOSIS — R262 Difficulty in walking, not elsewhere classified: Secondary | ICD-10-CM | POA: Diagnosis not present

## 2021-09-19 NOTE — Therapy (Addendum)
[facility] [address] [place], [place], [address] Phone: [phone]   Fax:  [phone]  Physical Therapy Treatment  Patient Details  Name: Sara [name] MRN: [id number] Date of Birth: Oct 27, 1950 Referring Provider (PT): Dr. Delana Meyer   Encounter Date: 09/19/2021   PT End of Session - 09/19/21 1111     Visit Number 5    Number of Visits 25    Date for PT Re-Evaluation 11/28/21    Authorization Type eval: 09/05/21    PT Start Time 1050    PT Stop Time 1140    PT Time Calculation (min) 50 min    Equipment Utilized During Treatment Gait belt    Activity Tolerance Patient tolerated treatment well    Behavior During Therapy [facility] for tasks assessed/performed              Past Medical History:  Diagnosis Date   Arthritis    right shoulder   Depression    Dyspnea    GERD (gastroesophageal reflux disease)    History of blood clots    Hyperlipidemia    Hypertension    Mild mitral regurgitation    Osteoporosis    Peripheral vascular disease ([place])    Stomach ulcer    Vascular disease    Sees Dr. Delana Meyer   Vertigo    Last episode approx Aug 2015   Wears dentures    full upper    Past Surgical History:  Procedure Laterality Date   ADENOIDECTOMY     AMPUTATION Right 08/11/2020   Procedure: AMPUTATION ABOVE KNEE;  Surgeon: [name], MD;  Location: [facility];  Service: Vascular;  Laterality: Right;   ARTERY BIOPSY Right 07/14/2019   Procedure: BIOPSY TEMPORAL ARTERY;  Surgeon: [name], MD;  Location: [facility];  Service: Vascular;  Laterality: Right;   BREAST CYST ASPIRATION Left    CARDIAC CATHETERIZATION  02/15/2017   [facility]   COLONOSCOPY WITH PROPOFOL N/A 10/22/2017   Procedure: COLONOSCOPY WITH PROPOFOL;  Surgeon: Lucilla Lame, MD;  Location: [place];  Service: Endoscopy;  Laterality: N/A;  specimens not taken--pt on Plavix will be brought back in after 7 days off med   COLONOSCOPY WITH  PROPOFOL N/A 11/05/2017   Procedure: COLONOSCOPY WITH PROPOFOL;  Surgeon: Lucilla Lame, MD;  Location: [place];  Service: Endoscopy;  Laterality: N/A;   ESOPHAGOGASTRODUODENOSCOPY N/A 12/21/2014   Procedure: ESOPHAGOGASTRODUODENOSCOPY (EGD);  Surgeon: Lucilla Lame, MD;  Location: [place];  Service: Gastroenterology;  Laterality: N/A;   ESOPHAGOGASTRODUODENOSCOPY (EGD) WITH PROPOFOL N/A 02/08/2016   Procedure: ESOPHAGOGASTRODUODENOSCOPY (EGD) WITH PROPOFOL;  Surgeon: Lucilla Lame, MD;  Location: [facility];  Service: Endoscopy;  Laterality: N/A;   FASCIOTOMY Right 05/30/2019   Procedure: FASCIOTOMY;  Surgeon: [name], MD;  Location: [facility];  Service: Vascular;  Laterality: Right;   FASCIOTOMY CLOSURE Right 06/04/2019   Procedure: FASCIOTOMY CLOSURE;  Surgeon: [name], MD;  Location: [facility];  Service: Vascular;  Laterality: Right;   HEMORROIDECTOMY  2014   LOWER EXTREMITY ANGIOGRAPHY Right 05/30/2019   Procedure: LOWER EXTREMITY ANGIOGRAPHY;  Surgeon: [name], MD;  Location: [place] [facility];  Service: Cardiovascular;  Laterality: Right;   LOWER EXTREMITY ANGIOGRAPHY Right 07/02/2020   Procedure: LOWER EXTREMITY ANGIOGRAPHY;  Surgeon: [name], MD;  Location: [place] [facility];  Service: Cardiovascular;  Laterality: Right;   PERIPHERAL VASCULAR CATHETERIZATION N/A 02/09/2016   Procedure: Abdominal Aortogram w/Lower Extremity;  Surgeon:  [name], MD;  Location: [place] [facility];  Service: Cardiovascular;  Laterality: N/A;   PERIPHERAL VASCULAR CATHETERIZATION Right 02/10/2016   Procedure: Lower Extremity Angiography;  Surgeon: [name], MD;  Location: [place] [facility];  Service: Cardiovascular;  Laterality: Right;   POLYPECTOMY  11/05/2017   Procedure: POLYPECTOMY;  Surgeon: Lucilla Lame, MD;  Location: [place];  Service: Endoscopy;;   SHOULDER ARTHROSCOPY WITH ROTATOR CUFF REPAIR AND  SUBACROMIAL DECOMPRESSION Right 02/27/2020   Procedure: RIGHT SHOULDER ARTHROSCOPY SUBACROMIAL DECOMPRESSION, DISTAL CLAVICLE EXCISION AND MINI-OPEN ROTATOR CUFF REPAIR;  Surgeon: Thornton Park, MD;  Location: [facility];  Service: Orthopedics;  Laterality: Right;   TONSILLECTOMY     VASCULAR SURGERY  [id number]   Fem-Pop Bypass    There were no vitals filed for this visit.   Subjective Assessment - 09/19/21 1112     Subjective Pt reports that she is doing well today. She denies any phantom pain currently. States she does not have the phantom pain usually during the day, but has it whenever she lays down and at night. Has had some issues with the prosthetic twisting around.    Pertinent History Pt reports slight worsening RLE phantom pain since ending therapy. She is taking gabapentin which helps with the pain however it is still very uncomfortable. Pain is continuous and is worse after extended walking with her prosthetic. She has not been performing her HEP after last discharge. She will get a suction fit socket for her RLE prosthesis this Friday.   History from 01/26/21: Pt has a history of RLE vascular issues since 1991. 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 She received her RLE prosthetic in April 2022 and has been receiving [facility] since her surgery. They discharged her yesterday to continue with OP PT. She continues with phantom RLE pain, especially at night and takes gabapentin with minimal relief. She has also been having R shoulder pain since the surgery which she attributes to using a wheelchair. She underwent R RTC surgery in September of 2021 and had physical therapy afterwards. She has been having issues with bilateral carpal tunnel and has a NCV study scheduled for tomorrow.   History from 01/26/21: Pt has a history of RLE vascular issues since 1991. 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 She received her RLE  prosthetic in April 2022 and has been receiving [facility] since her surgery. They discharged her yesterday to continue with OP PT. She continues with phantom RLE pain, especially at night and takes gabapentin with minimal relief. She has also been having R shoulder pain since the surgery which she attributes to using a wheelchair. She underwent R RTC surgery in September of 2021 and had physical therapy afterwards. She has been having issues with bilateral carpal tunnel and has a NCV study scheduled for tomorrow.    Limitations Walking    Diagnostic tests see history    Patient Stated Goals Ambulate with her prosthetic    Currently in Pain? No/denies                TREATMENT   Gait Training Gait outside on sidewalk, focusing on walking on uneven terrain and using a step through gait to encourage a better walking pattern. CGA from therapist and no AD   Ther-ex With prosthetic doffed: Supine R SLR hip flexion with manual resistance from therapist 2 x 20 Supine R manual resistance from  therapist  ER and IR 2 x 20 Supine R manual resistance from therapist Adduction 2 x 20 Supine BLE bridge with residual RLE resting on a stool (towel on top for cushion), arms at side, 3s hold x 10 was painful at the end of the limb so only completed one set L side-lying R hip abduction with manual resistance from therapist 2 x 20 L side-lying R hip extension from 90 hip flexion to available end range extension with manual resistance from therapist 2 x 20, was feeling pain at end of limb   Neuromuscular Re-education  Mirror therapy performed with patient sitting at edge of mat table and mirror between legs to simulate right lower extremity.  While watching the mirror with LLE patient performed seated marches x 1 minute with 3# ankle weight, long arc quads with 3# ankle weight x 1 minute, L ankle AROM plantar flexion/dorsiflexion x 1 minute, L hip abduction/adduction marching x 1 minute, L ankle ball circles  CW/CCW x 1 minute;   Pt educated throughout session about proper posture and technique with exercises. Improved exercise technique, movement at target joints, use of target muscles after min to mod verbal, visual, tactile cues.    Started session today with some walking and gait work without the [place] crutch. Went outside to practice walking on uneven surfaces and using a step through gait. Prosthesis started to twist which caused the foot to toe in. Cut walk short after adjusting prosthesis. Focused rest of the session on strengthening and neuromuscular re-education. Pt reported having occasional pain at the end of the residual limb during muscle contractions. Used repeated mirror exercises to try to help ease phantom limb pain. Pt will benefit from continued PT services to address deficits in strength, balance, mobility, and pain in order to return to full function at home and decrease pain.                            PT Short Term Goals - 09/05/21 1215       PT SHORT TERM GOAL #1   Title Pt will be independent with HEP in order to improve strength and balance as well as decrase pain in order to decrease fall risk and improve pain-free function at home.    Time 6    Period Weeks    Status New    Target Date 10/17/21               PT Long Term Goals - 09/05/21 1217       PT LONG TERM GOAL #1   Title Pt will decrease worst RLE phantom pain by at least 3 points on the NPRS in order to improve pain-free function at home    Baseline 09/05/21: Worst: 7/10    Time 12    Period Weeks    Status New    Target Date 11/28/21      PT LONG TERM GOAL #2   Title Pt will improve ABC by at least 13% in order to demonstrate clinically significant improvement in balance confidence.    Baseline 09/05/21: To be completed    Time 12    Period Weeks    Status New    Target Date 11/28/21      PT LONG TERM GOAL #3   Title Pt will improve FOTO to at least 59 in order to  demonstrate clinically significant improvement in function related to her RLE amputation  Baseline 09/05/21: 53    Time 12    Period Weeks    Status New    Target Date 11/28/21                   Plan - 09/19/21 1144     Clinical Impression Statement Started session today with some walking and gait work without the [place] crutch. Went outside to practice walking on uneven surfaces and using a step through gait. Prosthesis started to twist which caused the foot to toe in. Cut walk short after adjusting prosthesis. Focused rest of the session on strengthening and neuromuscular re-education. Pt reported having occasional pain at the end of the residual limb during muscle contractions. Used repeated mirror exercises to try to help ease phantom limb pain. Pt will benefit from continued PT services to address deficits in strength, balance, mobility, and pain in order to return to full function at home and decrease pain.    Personal Factors and Comorbidities Age;Comorbidity 3+    Comorbidities Depression, osteoporosis, vascular disease    Examination-Activity Limitations Bathing;Locomotion Level;Squat;Stairs;Stand;Transfers    Examination-Participation Restrictions Cleaning;Community Activity;Meal Prep;Yard Work    Stability/Clinical Decision Making Unstable/Unpredictable    Rehab Potential Good    PT Frequency 2x / week    PT Duration 12 weeks    PT Treatment/Interventions ADLs/Self Care Home Management;Aquatic Therapy;Canalith Repostioning;Cryotherapy;Electrical Stimulation;Iontophoresis 4mg /ml Dexamethasone;Moist Heat;Traction;Ultrasound;Gait training;Stair training;Therapeutic activities;Therapeutic exercise;Neuromuscular re-education;Manual techniques;Dry needling;Vestibular;Spinal Manipulations;Joint Manipulations;Balance training;Patient/family education;Cognitive remediation;Passive range of motion;Visual/perceptual remediation/compensation    PT Next Visit Plan complete ABC, mirror  therapy, desensitization techniques (STM, superficial stimulation)    PT Home Exercise Plan Last episode access code: [email or web address]    Consulted and Agree with Plan of Care Patient               Patient will benefit from skilled therapeutic intervention in order to improve the following deficits and impairments:  Abnormal gait, Decreased strength, Difficulty walking, Pain  Visit Diagnosis: Muscle weakness (generalized)  Pain in right leg  Phantom limb syndrome with pain [facility])     Problem List Patient Active Problem List   Diagnosis Date Noted   Muscle spasm 09/09/2020   S/P AKA (above knee amputation), right ([place]) 09/08/2020   Ischemia of extremity 07/02/2020   Chronic pain in right shoulder 11/25/2019   Encounter for long-term (current) use of high-risk medication 07/16/2019   GCA (giant cell arteritis) ([place]) 07/16/2019   Temporal arteritis ([place]) 07/13/2019   Postoperative wound infection 06/23/2019   Ischemic leg 05/31/2019   Atherosclerosis of native arteries of extremity with intermittent claudication ([place]) 05/30/2019   Depression 05/29/2019   Eczema of lower extremity 03/04/2018   Chronic venous insufficiency 03/02/2018   Personal history of colonic polyps    Family history of colonic polyps    Benign neoplasm of ascending colon    Mitral valve insufficiency 02/08/2017   Dyspnea on exertion 02/07/2017   Non-rheumatic mitral regurgitation 02/07/2017   Precordial pain 02/07/2017   CAD (coronary artery disease) 12/21/2016   Essential hypertension 12/21/2016   Compression fracture of lumbar spine, non-traumatic ([place]) 04/26/2016   Compression fracture of L3 lumbar vertebra 04/20/2016   PVD (peripheral vascular disease) ([place]) 02/24/2016   Family history of premature CAD 02/24/2016   Gastritis    Other specified diseases of esophagus    Hiatal hernia    Gastritis and gastroduodenitis    Ischemia of lower extremity    Arterial occlusion ([place])    Atherosclerosis of  aorta ([place])  History of smoking    Hyperlipidemia    Pain in the chest    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 02/05/2016   Carotid artery stenosis 01/04/2016   Vertigo, benign paroxysmal 12/28/2015   Clinical depression 09/06/2015   History of alcoholism ([place]) 09/06/2015   Angiopathy, 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[place]) 09/06/2015   Atherosclerosis of native arteries of extremity with rest pain ([place]) 09/06/2015   Acute non-recurrent maxillary sinusitis 07/14/2015   Vaginal pruritus 05/26/2015   Chronic recurrent major depressive disorder ([place]) 03/09/2015   Osteoporosis, post-menopausal 03/09/2015   Peripheral blood vessel disorder ([place]) 03/09/2015   Acid reflux 03/09/2015   GERD (gastroesophageal reflux disease) 12/21/2014   Carotid artery narrowing 01/28/2014   Peripheral arterial occlusive disease ([place]) 06/08/2011   Occlusion and stenosis of unspecified carotid artery 06/08/2011   PAD (peripheral artery disease) ([place]) 06/08/2011   Phillips Grout PT, DPT, GCS  Malana Eberwein SPT Huprich,Jason, PT 09/20/2021, 11:15 AM  [facility] [address] [place], [place], [address] Phone: [phone]   Fax:  [phone]  Name: Sara [name] MRN: [id number] Date of Birth: 05-08-1951

## 2021-09-21 ENCOUNTER — Other Ambulatory Visit: Payer: Self-pay

## 2021-09-21 ENCOUNTER — Ambulatory Visit: Payer: Medicare Other | Attending: Vascular Surgery

## 2021-09-21 DIAGNOSIS — M6281 Muscle weakness (generalized): Secondary | ICD-10-CM | POA: Insufficient documentation

## 2021-09-21 DIAGNOSIS — R262 Difficulty in walking, not elsewhere classified: Secondary | ICD-10-CM | POA: Insufficient documentation

## 2021-09-21 DIAGNOSIS — G546 Phantom limb syndrome with pain: Secondary | ICD-10-CM | POA: Diagnosis not present

## 2021-09-21 DIAGNOSIS — R2681 Unsteadiness on feet: Secondary | ICD-10-CM | POA: Insufficient documentation

## 2021-09-21 DIAGNOSIS — M79604 Pain in right leg: Secondary | ICD-10-CM | POA: Insufficient documentation

## 2021-09-21 NOTE — Therapy (Addendum)
[place] [facility] [address]. [place], [place], [address] Phone: [phone]   Fax:  [phone]  Physical Therapy Treatment  Patient Details  Name: Sara [name] MRN: [id number] Date of Birth: [birth year] Referring Provider (PT): Dr. Delana Meyer   Encounter Date: 09/21/2021   PT End of Session - 09/21/21 1148     Visit Number 6    Number of Visits 25    Date for PT Re-Evaluation 11/28/21    Authorization Type eval: 09/05/21    PT Start Time 1147    PT Stop Time 1225    PT Time Calculation (min) 38 min    Equipment Utilized During Treatment Gait belt    Activity Tolerance Patient tolerated treatment well    Behavior During Therapy [facility] for tasks assessed/performed              Past Medical History:  Diagnosis Date   Arthritis    right shoulder   Depression    Dyspnea    GERD (gastroesophageal reflux disease)    History of blood clots    Hyperlipidemia    Hypertension    Mild mitral regurgitation    Osteoporosis    Peripheral vascular disease ([place])    Stomach ulcer    Vascular disease    Sees Dr. Delana Meyer   Vertigo    Last episode approx Aug 2015   Wears dentures    full upper    Past Surgical History:  Procedure Laterality Date   ADENOIDECTOMY     AMPUTATION Right 08/11/2020   Procedure: AMPUTATION ABOVE KNEE;  Surgeon: [name], MD;  Location: [facility];  Service: Vascular;  Laterality: Right;   ARTERY BIOPSY Right 07/14/2019   Procedure: BIOPSY TEMPORAL ARTERY;  Surgeon: [name], MD;  Location: [facility];  Service: Vascular;  Laterality: Right;   BREAST CYST ASPIRATION Left    CARDIAC CATHETERIZATION  02/15/2017   [facility]   COLONOSCOPY WITH PROPOFOL N/A 10/22/2017   Procedure: COLONOSCOPY WITH PROPOFOL;  Surgeon: Lucilla Lame, MD;  Location: [place];  Service: Endoscopy;  Laterality: N/A;  specimens not taken--pt on Plavix will be brought back in after 7 days off med   COLONOSCOPY WITH  PROPOFOL N/A 11/05/2017   Procedure: COLONOSCOPY WITH PROPOFOL;  Surgeon: Lucilla Lame, MD;  Location: [place];  Service: Endoscopy;  Laterality: N/A;   ESOPHAGOGASTRODUODENOSCOPY N/A 12/21/2014   Procedure: ESOPHAGOGASTRODUODENOSCOPY (EGD);  Surgeon: Lucilla Lame, MD;  Location: [place];  Service: Gastroenterology;  Laterality: N/A;   ESOPHAGOGASTRODUODENOSCOPY (EGD) WITH PROPOFOL N/A 02/08/2016   Procedure: ESOPHAGOGASTRODUODENOSCOPY (EGD) WITH PROPOFOL;  Surgeon: Lucilla Lame, MD;  Location: [facility];  Service: Endoscopy;  Laterality: N/A;   FASCIOTOMY Right 05/30/2019   Procedure: FASCIOTOMY;  Surgeon: [name], MD;  Location: [facility];  Service: Vascular;  Laterality: Right;   FASCIOTOMY CLOSURE Right 06/04/2019   Procedure: FASCIOTOMY CLOSURE;  Surgeon: [name], MD;  Location: [facility];  Service: Vascular;  Laterality: Right;   HEMORROIDECTOMY  2014   LOWER EXTREMITY ANGIOGRAPHY Right 05/30/2019   Procedure: LOWER EXTREMITY ANGIOGRAPHY;  Surgeon: [name], MD;  Location: [place] [facility];  Service: Cardiovascular;  Laterality: Right;   LOWER EXTREMITY ANGIOGRAPHY Right 07/02/2020   Procedure: LOWER EXTREMITY ANGIOGRAPHY;  Surgeon: [name], MD;  Location: [place] [facility];  Service: Cardiovascular;  Laterality: Right;   PERIPHERAL VASCULAR CATHETERIZATION N/A 02/09/2016   Procedure: Abdominal Aortogram w/Lower Extremity;  Surgeon:  [name], MD;  Location: [place] [facility];  Service: Cardiovascular;  Laterality: N/A;   PERIPHERAL VASCULAR CATHETERIZATION Right 02/10/2016   Procedure: Lower Extremity Angiography;  Surgeon: [name], MD;  Location: [place] [facility];  Service: Cardiovascular;  Laterality: Right;   POLYPECTOMY  11/05/2017   Procedure: POLYPECTOMY;  Surgeon: Lucilla Lame, MD;  Location: [place];  Service: Endoscopy;;   SHOULDER ARTHROSCOPY WITH ROTATOR CUFF REPAIR AND  SUBACROMIAL DECOMPRESSION Right 02/27/2020   Procedure: RIGHT SHOULDER ARTHROSCOPY SUBACROMIAL DECOMPRESSION, DISTAL CLAVICLE EXCISION AND MINI-OPEN ROTATOR CUFF REPAIR;  Surgeon: Thornton Park, MD;  Location: [facility];  Service: Orthopedics;  Laterality: Right;   TONSILLECTOMY     VASCULAR SURGERY  [id number]   Fem-Pop Bypass    There were no vitals filed for this visit.   Subjective Assessment - 09/21/21 1213     Subjective Pt states she is doing okay today. She is still having issues with her prosthesis not suctioning properly. Prosthesis fell off while getting out of her car yesterday. Pt has an appointment to see the prosthetist today. She has been having occasional phantom pains still. Pt reports pain in her R shoulder has also flared up again.    Pertinent History Pt reports slight worsening RLE phantom pain since ending therapy. She is taking gabapentin which helps with the pain however it is still very uncomfortable. Pain is continuous and is worse after extended walking with her prosthetic. She has not been performing her HEP after last discharge. She will get a suction fit socket for her RLE prosthesis this Friday.   History from 01/26/21: Pt has a history of RLE vascular issues since 1991. 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 She received her RLE prosthetic in April 2022 and has been receiving [facility] since her surgery. They discharged her yesterday to continue with OP PT. She continues with phantom RLE pain, especially at night and takes gabapentin with minimal relief. She has also been having R shoulder pain since the surgery which she attributes to using a wheelchair. She underwent R RTC surgery in September of 2021 and had physical therapy afterwards. She has been having issues with bilateral carpal tunnel and has a NCV study scheduled for tomorrow.   History from 01/26/21: Pt has a history of RLE vascular issues since 1991. 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 She received her RLE prosthetic in April 2022 and has been receiving [facility] since her surgery. They discharged her yesterday to continue with OP PT. She continues with phantom RLE pain, especially at night and takes gabapentin with minimal relief. She has also been having R shoulder pain since the surgery which she attributes to using a wheelchair. She underwent R RTC surgery in September of 2021 and had physical therapy afterwards. She has been having issues with bilateral carpal tunnel and has a NCV study scheduled for tomorrow.    Limitations Walking    Diagnostic tests see history    Patient Stated Goals Ambulate with her prosthetic                 TREATMENT   Ther-ex With prosthetic doffed: Obstacle course at // bars: step up on 6" step, step on airex pad and around cone x 3 Standing marches at // bars 3 x 59min Stairs 3 sets up and down; focusing on hiking R hip to ensure toe on prosthetic does  not catch on step Side stepping at // bars x 5 lengths each direction    Neuromuscular Re-education  Mirror therapy performed with patient sitting at edge of chair and mirror between legs to simulate right lower extremity.  While watching the mirror with LLE patient performed seated marches x 1 minute with 3# ankle weight, long arc quads with 3# ankle weight x 1 minute, L ankle AROM plantar flexion/dorsiflexion x 1 minute, L hip abduction/adduction marching x 1 minute, L ankle ball circles CW/CCW 2 x 1 minute   Pt educated throughout session about proper posture and technique with exercises. Improved exercise technique, movement at target joints, use of target muscles after min to mod verbal, visual, tactile cues.    Focused todays session on upright exercises with the prosthetic on. Pt was able to easily navigate obstacle course w/o UE support. When ascending and descending stairs made emphasis on going up with her L and down with  the R. Needed cueing to ensure R hip was hiking up enough while ascending stairs to ensure that the toe of the prosthesis was not catching on the lip of the stair. Pt will benefit from continued PT services to further address deficits in strength, balance, mobility and pain in order to return to full function at home and decrease overall pain.                          PT Short Term Goals - 09/05/21 1215       PT SHORT TERM GOAL #1   Title Pt will be independent with HEP in order to improve strength and balance as well as decrase pain in order to decrease fall risk and improve pain-free function at home.    Time 6    Period Weeks    Status New    Target Date 10/17/21               PT Long Term Goals - 09/05/21 1217       PT LONG TERM GOAL #1   Title Pt will decrease worst RLE phantom pain by at least 3 points on the NPRS in order to improve pain-free function at home    Baseline 09/05/21: Worst: 7/10    Time 12    Period Weeks    Status New    Target Date 11/28/21      PT LONG TERM GOAL #2   Title Pt will improve ABC by at least 13% in order to demonstrate clinically significant improvement in balance confidence.    Baseline 09/05/21: To be completed    Time 12    Period Weeks    Status New    Target Date 11/28/21      PT LONG TERM GOAL #3   Title Pt will improve FOTO to at least 59 in order to demonstrate clinically significant improvement in function related to her RLE amputation    Baseline 09/05/21: 53    Time 12    Period Weeks    Status New    Target Date 11/28/21                   Plan - 09/21/21 1227     Clinical Impression Statement Focused todays session on upright exercises with the prosthetic donned. Pt was able to easily navigate obstacle course w/o UE support. When ascending and descending stairs made emphasis on going up with her L and down with the R. Needed  cueing to ensure R hip was hiking up enough while ascending stairs  to ensure that the toe of the prosthesis was not catching on the lip of the stair. Pt will benefit from continued PT services to further address deficits in strength, balance, mobility and pain in order to return to full function at home and decrease overall pain.    Personal Factors and Comorbidities Age;Comorbidity 3+    Comorbidities Depression, osteoporosis, vascular disease    Examination-Activity Limitations Bathing;Locomotion Level;Squat;Stairs;Stand;Transfers    Examination-Participation Restrictions Cleaning;Community Activity;Meal Prep;Yard Work    Stability/Clinical Decision Making Unstable/Unpredictable    Rehab Potential Good    PT Frequency 2x / week    PT Duration 12 weeks    PT Treatment/Interventions ADLs/Self Care Home Management;Aquatic Therapy;Canalith Repostioning;Cryotherapy;Electrical Stimulation;Iontophoresis 4mg /ml Dexamethasone;Moist Heat;Traction;Ultrasound;Gait training;Stair training;Therapeutic activities;Therapeutic exercise;Neuromuscular re-education;Manual techniques;Dry needling;Vestibular;Spinal Manipulations;Joint Manipulations;Balance training;Patient/family education;Cognitive remediation;Passive range of motion;Visual/perceptual remediation/compensation    PT Next Visit Plan complete ABC, mirror therapy, desensitization techniques (STM, superficial stimulation)    PT Home Exercise Plan Last episode access code: [email or web address]    Consulted and Agree with Plan of Care Patient                Patient will benefit from skilled therapeutic intervention in order to improve the following deficits and impairments:  Abnormal gait, Decreased strength, Difficulty walking, Pain  Visit Diagnosis: Muscle weakness (generalized)  Difficulty in walking, not elsewhere classified  Unsteadiness on feet  Pain in right leg     Problem List Patient Active Problem List   Diagnosis Date Noted   Muscle spasm 09/09/2020   S/P AKA (above knee amputation), right ([place])  09/08/2020   Ischemia of extremity 07/02/2020   Chronic pain in right shoulder 11/25/2019   Encounter for long-term (current) use of high-risk medication 07/16/2019   GCA (giant cell arteritis) ([place]) 07/16/2019   Temporal arteritis ([place]) 07/13/2019   Postoperative wound infection 06/23/2019   Ischemic leg 05/31/2019   Atherosclerosis of native arteries of extremity with intermittent claudication ([place]) 05/30/2019   Depression 05/29/2019   Eczema of lower extremity 03/04/2018   Chronic venous insufficiency 03/02/2018   Personal history of colonic polyps    Family history of colonic polyps    Benign neoplasm of ascending colon    Mitral valve insufficiency 02/08/2017   Dyspnea on exertion 02/07/2017   Non-rheumatic mitral regurgitation 02/07/2017   Precordial pain 02/07/2017   CAD (coronary artery disease) 12/21/2016   Essential hypertension 12/21/2016   Compression fracture of lumbar spine, non-traumatic ([place]) 04/26/2016   Compression fracture of L3 lumbar vertebra 04/20/2016   PVD (peripheral vascular disease) ([place]) 02/24/2016   Family history of premature CAD 02/24/2016   Gastritis    Other specified diseases of esophagus    Hiatal hernia    Gastritis and gastroduodenitis    Ischemia of lower extremity    Arterial occlusion (H[facility])    Atherosclerosis of aorta ([place])    History of smoking    Hyperlipidemia    Pain in the chest    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 02/05/2016   Carotid artery stenosis 01/04/2016   Vertigo, benign paroxysmal 12/28/2015   Clinical depression 09/06/2015   History of alcoholism ([place]) 09/06/2015   Angiopathy, 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[place]) 09/06/2015   Atherosclerosis of native arteries of extremity with rest pain ([place]) 09/06/2015   Acute non-recurrent maxillary sinusitis 07/14/2015   Vaginal pruritus 05/26/2015   Chronic recurrent major depressive disorder ([place]) 03/09/2015   Osteoporosis, post-menopausal 03/09/2015  Peripheral blood  vessel disorder (HCC) 03/09/2015   Acid reflux 03/09/2015   GERD (gastroesophageal reflux disease) 12/21/2014   Carotid artery narrowing 01/28/2014   Peripheral arterial occlusive disease ([place]) 06/08/2011   Occlusion and stenosis of unspecified carotid artery 06/08/2011   PAD (peripheral artery disease) ([place]) 06/08/2011   Phillips Grout PT, DPT, GCS  Kimberla Driskill SPT Huprich,Jason, PT 09/22/2021, 9:00 AM  [place] [facility] [address] [place], [place], [address] Phone: [phone]   Fax:  [phone]  Name: Sara [name] MRN: [id number] Date of Birth: Aug 11, 1951

## 2021-09-26 ENCOUNTER — Telehealth (INDEPENDENT_AMBULATORY_CARE_PROVIDER_SITE_OTHER): Payer: Self-pay | Admitting: Vascular Surgery

## 2021-09-26 ENCOUNTER — Ambulatory Visit: Payer: Medicare Other

## 2021-09-26 DIAGNOSIS — Z20822 Contact with and (suspected) exposure to covid-19: Secondary | ICD-10-CM | POA: Diagnosis not present

## 2021-09-26 DIAGNOSIS — U071 COVID-19: Secondary | ICD-10-CM | POA: Diagnosis not present

## 2021-09-26 NOTE — Telephone Encounter (Signed)
Sara [name] states she will be running out of her Eliquis 5 mg and wanted to know if you were going to send in a refill.  She has COVID and had to reschedule her appointment.  Thanks

## 2021-09-26 NOTE — Progress Notes (Deleted)
MRN : [id number]  Sara [name] is a 71 y.o. (1951/02/09) female who presents with chief complaint of leg pain.  History of Present Illness:  The patient returns to the office sooner than expected with complaints of phantom pains involving the right lower extremity and the sensation that he she is still having excruciating pain in her right foot.  She actually feels that the discomfort/pain is intensifying with time not going away.  At the present time she is now taking 1600 mg total of gabapentin per day.  She is also taking Cymbalta.  She does note that she has been through physical therapy and has done some mirror therapy as well.   No outpatient medications have been marked as taking for the 09/29/21 encounter (Appointment) with Delana Meyer, Dolores Lory, MD.    Past Medical History:  Diagnosis Date   Arthritis    right shoulder   Depression    Dyspnea    GERD (gastroesophageal reflux disease)    History of blood clots    Hyperlipidemia    Hypertension    Mild mitral regurgitation    Osteoporosis    Peripheral vascular disease ([place])    Stomach ulcer    Vascular disease    Sees Dr. Delana Meyer   Vertigo    Last episode approx Aug 2015   Wears dentures    full upper    Past Surgical History:  Procedure Laterality Date   ADENOIDECTOMY     AMPUTATION Right 08/11/2020   Procedure: AMPUTATION ABOVE KNEE;  Surgeon: [name], MD;  Location: [facility];  Service: Vascular;  Laterality: Right;   ARTERY BIOPSY Right 07/14/2019   Procedure: BIOPSY TEMPORAL ARTERY;  Surgeon: [name], MD;  Location: [facility];  Service: Vascular;  Laterality: Right;   BREAST CYST ASPIRATION Left    CARDIAC CATHETERIZATION  02/15/2017   [facility]   COLONOSCOPY WITH PROPOFOL N/A 10/22/2017   Procedure: COLONOSCOPY WITH PROPOFOL;  Surgeon: Lucilla Lame, MD;  Location: [place];  Service: Endoscopy;  Laterality: N/A;  specimens not taken--pt on Plavix will be brought back in after 7 days off  med   COLONOSCOPY WITH PROPOFOL N/A 11/05/2017   Procedure: COLONOSCOPY WITH PROPOFOL;  Surgeon: Lucilla Lame, MD;  Location: [place];  Service: Endoscopy;  Laterality: N/A;   ESOPHAGOGASTRODUODENOSCOPY N/A 12/21/2014   Procedure: ESOPHAGOGASTRODUODENOSCOPY (EGD);  Surgeon: Lucilla Lame, MD;  Location: [place];  Service: Gastroenterology;  Laterality: N/A;   ESOPHAGOGASTRODUODENOSCOPY (EGD) WITH PROPOFOL N/A 02/08/2016   Procedure: ESOPHAGOGASTRODUODENOSCOPY (EGD) WITH PROPOFOL;  Surgeon: Lucilla Lame, MD;  Location: [facility];  Service: Endoscopy;  Laterality: N/A;   FASCIOTOMY Right 05/30/2019   Procedure: FASCIOTOMY;  Surgeon: [name], MD;  Location: [facility];  Service: Vascular;  Laterality: Right;   FASCIOTOMY CLOSURE Right 06/04/2019   Procedure: FASCIOTOMY CLOSURE;  Surgeon: [name], MD;  Location: [facility];  Service: Vascular;  Laterality: Right;   HEMORROIDECTOMY  2014   LOWER EXTREMITY ANGIOGRAPHY Right 05/30/2019   Procedure: LOWER EXTREMITY ANGIOGRAPHY;  Surgeon: [name], MD;  Location: [place] [facility];  Service: Cardiovascular;  Laterality: Right;   LOWER EXTREMITY ANGIOGRAPHY Right 07/02/2020   Procedure: LOWER EXTREMITY ANGIOGRAPHY;  Surgeon: [name], MD;  Location: [place] [facility];  Service: Cardiovascular;  Laterality: Right;   PERIPHERAL VASCULAR CATHETERIZATION N/A 02/09/2016   Procedure: Abdominal Aortogram w/Lower Extremity;  Surgeon: [name], MD;  Location: [place] [facility];  Service: Cardiovascular;  Laterality: N/A;   PERIPHERAL VASCULAR CATHETERIZATION Right 02/10/2016   Procedure: Lower Extremity Angiography;  Surgeon: [name], MD;  Location: [place] [facility];  Service: Cardiovascular;  Laterality: Right;   POLYPECTOMY  11/05/2017   Procedure: POLYPECTOMY;  Surgeon: Lucilla Lame, MD;  Location: [place];  Service: Endoscopy;;   SHOULDER ARTHROSCOPY WITH  ROTATOR CUFF REPAIR AND SUBACROMIAL DECOMPRESSION Right 02/27/2020   Procedure: RIGHT SHOULDER ARTHROSCOPY SUBACROMIAL DECOMPRESSION, DISTAL CLAVICLE EXCISION AND MINI-OPEN ROTATOR CUFF REPAIR;  Surgeon: Thornton Park, MD;  Location: [facility];  Service: Orthopedics;  Laterality: Right;   TONSILLECTOMY     VASCULAR SURGERY  [id number]   Fem-Pop Bypass    Social History Social History   Tobacco Use   Smoking status: Former    Packs/day: 2.00    Years: 25.00    Pack years: 50.00    Types: Cigarettes    Quit date: 08/22/1991    Years since quitting: 30.1   Smokeless tobacco: Never  Vaping Use   Vaping Use: Never used  Substance Use Topics   Alcohol use: No    Alcohol/week: 0.0 standard drinks   Drug use: Never    Family History Family History  Problem Relation Age of Onset   CVA Mother    Heart attack Mother    Cancer Father        colon cancer   Colon cancer Father    Heart disease Maternal Uncle    Breast cancer Neg Hx     Allergies  Allergen Reactions   Hydrocodone-Acetaminophen     Other reaction(s): Flushing Other reaction(s): Flushing   Amoxicillin Other (See Comments)    Yeast infection   Metoprolol Tartrate Rash   Oxycodone Itching   Vicodin [Hydrocodone-Acetaminophen] Hives and Rash    Flushing     REVIEW OF SYSTEMS (Negative unless checked)  Constitutional: [] Weight loss  [] Fever  [] Chills Cardiac: [] Chest pain   [] Chest pressure   [] Palpitations   [] Shortness of breath when laying flat   [] Shortness of breath with exertion. Vascular:  [x] Pain in legs with walking   [x] Pain in legs at rest  [] History of DVT   [] Phlebitis   [] Swelling in legs   [] Varicose veins   [] Non-healing ulcers Pulmonary:   [] Uses home oxygen   [] Productive cough   [] Hemoptysis   [] Wheeze  [] COPD   [] Asthma Neurologic:  [] Dizziness   [] Seizures   [] History of stroke   [] History of TIA  [] Aphasia   [] Vissual changes   [] Weakness or numbness in arm   [] Weakness or numbness in  leg Musculoskeletal:   [] Joint swelling   [] Joint pain   [] Low back pain Hematologic:  [] Easy bruising  [] Easy bleeding   [] Hypercoagulable state   [] Anemic Gastrointestinal:  [] Diarrhea   [] Vomiting  [x] Gastroesophageal reflux/heartburn   [] Difficulty swallowing. Genitourinary:  [] Chronic kidney disease   [] Difficult urination  [] Frequent urination   [] Blood in urine Skin:  [] Rashes   [] Ulcers  Psychological:  [] History of anxiety   []  History of major depression.  Physical Examination  There were no vitals filed for this visit. There is no height or weight on file to calculate BMI. Gen: WD/WN, NAD Head: [place]/AT, No temporalis wasting.  Ear/Nose/Throat: Hearing grossly intact, nares w/o erythema or drainage Eyes: PER, EOMI, sclera nonicteric.  Neck: Supple, no masses.  No bruit or JVD.  Pulmonary:  Good air movement, no audible wheezing, no use of accessory muscles.  Cardiac: RRR, normal S1, S2, no  Murmurs. Vascular:  *** Vessel Right Left  Radial Palpable Palpable  Carotid Palpable Palpable  PT AKA Palpable  DP AKA Palpable  Gastrointestinal: soft, non-distended. No guarding/no peritoneal signs.  Musculoskeletal: M/S 5/5 throughout.  No visible deformity.  Neurologic: CN 2-12 intact. Pain and light touch intact in extremities.  Symmetrical.  Speech is fluent. Motor exam as listed above. Psychiatric: Judgment intact, Mood & affect appropriate for pt's clinical situation. Dermatologic: No rashes or ulcers noted.  No changes consistent with cellulitis.   CBC Lab Results  Component Value Date   WBC 5.3 06/24/2021   HGB 13.7 06/24/2021   HCT 42.1 06/24/2021   MCV 97.5 06/24/2021   PLT 163 06/24/2021    BMET    Component Value Date/Time   NA 136 06/24/2021 1139   NA 139 09/21/2015 0916   K 3.8 06/24/2021 1139   CL 102 06/24/2021 1139   CO2 26 06/24/2021 1139   GLUCOSE 105 (H) 06/24/2021 1139   BUN 15 06/24/2021 1139   BUN 12 09/21/2015 0916   CREATININE 0.80 06/24/2021  1139   CREATININE 0.88 05/16/2016 1034   CALCIUM 8.7 (L) 06/24/2021 1139   GFRNONAA >60 06/24/2021 1139   GFRNONAA 66 05/01/2016 0846   GFRAA >60 02/18/2020 0949   GFRAA 76 05/01/2016 0846   CrCl cannot be calculated (Patient's most recent lab result is older than the maximum 21 days allowed.).  COAG Lab Results  Component Value Date   INR 1.0 08/09/2020   INR 1.3 (H) 02/18/2020   INR 1.0 07/11/2019    Radiology No results found.   Assessment/Plan There are no diagnoses linked to this encounter.   [name], MD  09/26/2021 12:09 PM

## 2021-09-28 DIAGNOSIS — Z20822 Contact with and (suspected) exposure to covid-19: Secondary | ICD-10-CM | POA: Diagnosis not present

## 2021-09-29 ENCOUNTER — Ambulatory Visit (INDEPENDENT_AMBULATORY_CARE_PROVIDER_SITE_OTHER): Payer: Medicare Other | Admitting: Vascular Surgery

## 2021-10-03 ENCOUNTER — Other Ambulatory Visit: Payer: Self-pay

## 2021-10-03 ENCOUNTER — Ambulatory Visit: Payer: Medicare Other

## 2021-10-03 DIAGNOSIS — M79604 Pain in right leg: Secondary | ICD-10-CM | POA: Diagnosis not present

## 2021-10-03 DIAGNOSIS — R2681 Unsteadiness on feet: Secondary | ICD-10-CM | POA: Diagnosis not present

## 2021-10-03 DIAGNOSIS — M6281 Muscle weakness (generalized): Secondary | ICD-10-CM | POA: Diagnosis not present

## 2021-10-03 DIAGNOSIS — R262 Difficulty in walking, not elsewhere classified: Secondary | ICD-10-CM | POA: Diagnosis not present

## 2021-10-03 DIAGNOSIS — G546 Phantom limb syndrome with pain: Secondary | ICD-10-CM | POA: Diagnosis not present

## 2021-10-03 NOTE — Therapy (Addendum)
[facility] [address] [place], [place], [address] Phone: [phone]   Fax:  [phone]  Physical Therapy Treatment  Patient Details  Name: Sara [name] MRN: [id number] Date of Birth: 02-25-1951 Referring Provider (PT): Dr. Delana Meyer   Encounter Date: 10/03/2021   PT End of Session - 10/03/21 1059     Visit Number 7    Number of Visits 25    Date for PT Re-Evaluation 11/28/21    Authorization Type eval: 09/05/21    PT Start Time 1032    PT Stop Time 1114    PT Time Calculation (min) 42 min    Equipment Utilized During Treatment Gait belt    Activity Tolerance Patient tolerated treatment well    Behavior During Therapy [facility] for tasks assessed/performed               Past Medical History:  Diagnosis Date   Arthritis    right shoulder   Depression    Dyspnea    GERD (gastroesophageal reflux disease)    History of blood clots    Hyperlipidemia    Hypertension    Mild mitral regurgitation    Osteoporosis    Peripheral vascular disease ([place])    Stomach ulcer    Vascular disease    Sees Dr. Delana Meyer   Vertigo    Last episode approx Aug 2015   Wears dentures    full upper    Past Surgical History:  Procedure Laterality Date   ADENOIDECTOMY     AMPUTATION Right 08/11/2020   Procedure: AMPUTATION ABOVE KNEE;  Surgeon: [name], MD;  Location: [facility];  Service: Vascular;  Laterality: Right;   ARTERY BIOPSY Right 07/14/2019   Procedure: BIOPSY TEMPORAL ARTERY;  Surgeon: [name], MD;  Location: [facility];  Service: Vascular;  Laterality: Right;   BREAST CYST ASPIRATION Left    CARDIAC CATHETERIZATION  02/15/2017   [facility]   COLONOSCOPY WITH PROPOFOL N/A 10/22/2017   Procedure: COLONOSCOPY WITH PROPOFOL;  Surgeon: Lucilla Lame, MD;  Location: [place];  Service: Endoscopy;  Laterality: N/A;  specimens not taken--pt on Plavix will be brought back in after 7 days off med   COLONOSCOPY WITH  PROPOFOL N/A 11/05/2017   Procedure: COLONOSCOPY WITH PROPOFOL;  Surgeon: Lucilla Lame, MD;  Location: [place];  Service: Endoscopy;  Laterality: N/A;   ESOPHAGOGASTRODUODENOSCOPY N/A 12/21/2014   Procedure: ESOPHAGOGASTRODUODENOSCOPY (EGD);  Surgeon: Lucilla Lame, MD;  Location: [place];  Service: Gastroenterology;  Laterality: N/A;   ESOPHAGOGASTRODUODENOSCOPY (EGD) WITH PROPOFOL N/A 02/08/2016   Procedure: ESOPHAGOGASTRODUODENOSCOPY (EGD) WITH PROPOFOL;  Surgeon: Lucilla Lame, MD;  Location: [facility];  Service: Endoscopy;  Laterality: N/A;   FASCIOTOMY Right 05/30/2019   Procedure: FASCIOTOMY;  Surgeon: [name], MD;  Location: [facility];  Service: Vascular;  Laterality: Right;   FASCIOTOMY CLOSURE Right 06/04/2019   Procedure: FASCIOTOMY CLOSURE;  Surgeon: [name], MD;  Location: [facility];  Service: Vascular;  Laterality: Right;   HEMORROIDECTOMY  2014   LOWER EXTREMITY ANGIOGRAPHY Right 05/30/2019   Procedure: LOWER EXTREMITY ANGIOGRAPHY;  Surgeon: [name], MD;  Location: [place] [facility];  Service: Cardiovascular;  Laterality: Right;   LOWER EXTREMITY ANGIOGRAPHY Right 07/02/2020   Procedure: LOWER EXTREMITY ANGIOGRAPHY;  Surgeon: [name], MD;  Location: [place] [facility];  Service: Cardiovascular;  Laterality: Right;   PERIPHERAL VASCULAR CATHETERIZATION N/A 02/09/2016   Procedure: Abdominal Aortogram w/Lower Extremity;  Surgeon: [name], MD;  Location: [place] [facility];  Service: Cardiovascular;  Laterality: N/A;   PERIPHERAL VASCULAR CATHETERIZATION Right 02/10/2016   Procedure: Lower Extremity Angiography;  Surgeon: [name], MD;  Location: [place] [facility];  Service: Cardiovascular;  Laterality: Right;   POLYPECTOMY  11/05/2017   Procedure: POLYPECTOMY;  Surgeon: Lucilla Lame, MD;  Location: [place];  Service: Endoscopy;;   SHOULDER ARTHROSCOPY WITH ROTATOR CUFF REPAIR AND  SUBACROMIAL DECOMPRESSION Right 02/27/2020   Procedure: RIGHT SHOULDER ARTHROSCOPY SUBACROMIAL DECOMPRESSION, DISTAL CLAVICLE EXCISION AND MINI-OPEN ROTATOR CUFF REPAIR;  Surgeon: Thornton Park, MD;  Location: [facility];  Service: Orthopedics;  Laterality: Right;   TONSILLECTOMY     VASCULAR SURGERY  [id number]   Fem-Pop Bypass    There were no vitals filed for this visit.   Subjective Assessment - 10/03/21 1032     Subjective Pt states she is feeling good today and is feeling better since having covid. Pt reports not having the leg on very often due to being sick. Pt still reporting phantom pain especially in foot and ankle.    Pertinent History Pt reports slight worsening RLE phantom pain since ending therapy. She is taking gabapentin which helps with the pain however it is still very uncomfortable. Pain is continuous and is worse after extended walking with her prosthetic. She has not been performing her HEP after last discharge. She will get a suction fit socket for her RLE prosthesis this Friday.   History from 01/26/21: Pt has a history of RLE vascular issues since 1991. 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 She received her RLE prosthetic in April 2022 and has been receiving [facility] since her surgery. They discharged her yesterday to continue with OP PT. She continues with phantom RLE pain, especially at night and takes gabapentin with minimal relief. She has also been having R shoulder pain since the surgery which she attributes to using a wheelchair. She underwent R RTC surgery in September of 2021 and had physical therapy afterwards. She has been having issues with bilateral carpal tunnel and has a NCV study scheduled for tomorrow.   History from 01/26/21: Pt has a history of RLE vascular issues since 1991. 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 She received her RLE prosthetic in April 2022 and has been receiving [facility]  PT since her surgery. They discharged her yesterday to continue with OP PT. She continues with phantom RLE pain, especially at night and takes gabapentin with minimal relief. She has also been having R shoulder pain since the surgery which she attributes to using a wheelchair. She underwent R RTC surgery in September of 2021 and had physical therapy afterwards. She has been having issues with bilateral carpal tunnel and has a NCV study scheduled for tomorrow.    Limitations Walking    Diagnostic tests see history    Patient Stated Goals Ambulate with her prosthetic                 TREATMENT   Ther-ex With prosthetic doffed: Standing marches at // bars 2 x 10 BLE Side stepping at // bars x 4 lengths each direction   Without prosthetic: Supine flexion, ER, IR, abduction and adduction with therapist manual resistance 2 x 20 each    Neuromuscular Re-education  Mirror therapy performed with patient sitting at edge of chair and mirror between legs to simulate right lower extremity.  While watching the mirror with LLE patient performed seated marches x 1 minute with 3# ankle weight, long arc quads with 3# ankle weight x 1 minute, L ankle AROM plantar flexion/dorsiflexion x 1 minute, L hip abduction/adduction marching x 1 minute, L ankle ball circles CW x 1 minute, L ankle ball circles CCW x 1 minute   Pt educated throughout session about proper posture and technique with exercises. Improved exercise technique, movement at target joints, use of target muscles after min to mod verbal, visual, tactile cues.    For session today mixed upright exercises with the prosthetic on and some supine manually resisted exercises without the prosthetic. Could not perform any side-lying manual resistance exercises due to experiencing pain at end of residual limb. Patient reports having an easier time with putting on and taking off her new socket. Phantom pain has been staying constant throughout session.  Pt would benefit from continued PT services to address deficits in strength, balance, mobility and pain in order to return to full function at home and decrease overall pain.                           PT Short Term Goals - 09/05/21 1215       PT SHORT TERM GOAL #1   Title Pt will be independent with HEP in order to improve strength and balance as well as decrase pain in order to decrease fall risk and improve pain-free function at home.    Time 6    Period Weeks    Status New    Target Date 10/17/21               PT Long Term Goals - 09/05/21 1217       PT LONG TERM GOAL #1   Title Pt will decrease worst RLE phantom pain by at least 3 points on the NPRS in order to improve pain-free function at home    Baseline 09/05/21: Worst: 7/10    Time 12    Period Weeks    Status New    Target Date 11/28/21      PT LONG TERM GOAL #2   Title Pt will improve ABC by at least 13% in order to demonstrate clinically significant improvement in balance confidence.    Baseline 09/05/21: To be completed    Time 12    Period Weeks    Status New    Target Date 11/28/21      PT LONG TERM GOAL #3   Title Pt will improve FOTO to at least 59 in order to demonstrate clinically significant improvement in function related to her RLE amputation    Baseline 09/05/21: 53    Time 12    Period Weeks    Status New    Target Date 11/28/21                   Plan - 10/03/21 1102     Clinical Impression Statement For session today mixed upright exercises with the prosthetic on and some supine manually resisted exercises without the prosthetic. Could not perform any side-lying manual resistance exercises due to experiencing pain at end of residual limb. Patient reports having an easier time with putting on and taking off her new socket. Phantom pain has been staying constant throughout session. Pt would benefit from continued PT services to address deficits in strength,  balance, mobility and pain in order to return to full function  at home and decrease overall pain.    Personal Factors and Comorbidities Age;Comorbidity 3+    Comorbidities Depression, osteoporosis, vascular disease    Examination-Activity Limitations Bathing;Locomotion Level;Squat;Stairs;Stand;Transfers    Examination-Participation Restrictions Cleaning;Community Activity;Meal Prep;Yard Work    Stability/Clinical Decision Making Unstable/Unpredictable    Rehab Potential Good    PT Frequency 2x / week    PT Duration 12 weeks    PT Treatment/Interventions ADLs/Self Care Home Management;Aquatic Therapy;Canalith Repostioning;Cryotherapy;Electrical Stimulation;Iontophoresis 4mg /ml Dexamethasone;Moist Heat;Traction;Ultrasound;Gait training;Stair training;Therapeutic activities;Therapeutic exercise;Neuromuscular re-education;Manual techniques;Dry needling;Vestibular;Spinal Manipulations;Joint Manipulations;Balance training;Patient/family education;Cognitive remediation;Passive range of motion;Visual/perceptual remediation/compensation    PT Next Visit Plan complete ABC, mirror therapy, desensitization techniques (STM, superficial stimulation)    PT Home Exercise Plan Last episode access code: [email or web address]    Consulted and Agree with Plan of Care Patient                 Patient will benefit from skilled therapeutic intervention in order to improve the following deficits and impairments:  Abnormal gait, Decreased strength, Difficulty walking, Pain  Visit Diagnosis: Muscle weakness (generalized)  Difficulty in walking, not elsewhere classified  Phantom limb syndrome with pain [facility])     Problem List Patient Active Problem List   Diagnosis Date Noted   Muscle spasm 09/09/2020   S/P AKA (above knee amputation), right ([place]) 09/08/2020   Ischemia of extremity 07/02/2020   Chronic pain in right shoulder 11/25/2019   Encounter for long-term (current) use of high-risk medication 07/16/2019    GCA (giant cell arteritis) ([place]) 07/16/2019   Temporal arteritis ([place]) 07/13/2019   Postoperative wound infection 06/23/2019   Ischemic leg 05/31/2019   Atherosclerosis of native arteries of extremity with intermittent claudication ([place]) 05/30/2019   Depression 05/29/2019   Eczema of lower extremity 03/04/2018   Chronic venous insufficiency 03/02/2018   Personal history of colonic polyps    Family history of colonic polyps    Benign neoplasm of ascending colon    Mitral valve insufficiency 02/08/2017   Dyspnea on exertion 02/07/2017   Non-rheumatic mitral regurgitation 02/07/2017   Precordial pain 02/07/2017   CAD (coronary artery disease) 12/21/2016   Essential hypertension 12/21/2016   Compression fracture of lumbar spine, non-traumatic ([place]) 04/26/2016   Compression fracture of L3 lumbar vertebra 04/20/2016   PVD (peripheral vascular disease) ([place]) 02/24/2016   Family history of premature CAD 02/24/2016   Gastritis    Other specified diseases of esophagus    Hiatal hernia    Gastritis and gastroduodenitis    Ischemia of lower extremity    Arterial occlusion (H[facility])    Atherosclerosis of aorta ([place])    History of smoking    Hyperlipidemia    Pain in the chest    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 02/05/2016   Carotid artery stenosis 01/04/2016   Vertigo, benign paroxysmal 12/28/2015   Clinical depression 09/06/2015   History of alcoholism ([place]) 09/06/2015   Angiopathy, 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[place]) 09/06/2015   Atherosclerosis of native arteries of extremity with rest pain ([place]) 09/06/2015   Acute non-recurrent maxillary sinusitis 07/14/2015   Vaginal pruritus 05/26/2015   Chronic recurrent major depressive disorder ([place]) 03/09/2015   Osteoporosis, post-menopausal 03/09/2015   Peripheral blood vessel disorder ([place]) 03/09/2015   Acid reflux 03/09/2015   GERD (gastroesophageal reflux disease) 12/21/2014   Carotid artery narrowing 01/28/2014   Peripheral  arterial occlusive disease ([place]) 06/08/2011   Occlusion and stenosis of unspecified carotid artery 06/08/2011   PAD (peripheral artery disease) ([place]) 06/08/2011   Lyndel Safe  Huprich PT, DPT, GCS  Avya Flavell SPT Huprich,Jason, PT 10/04/2021, 8:58 AM  [facility] [address] [place], [place], [address] Phone: [phone]   Fax:  [phone]  Name: Sara [name] MRN: [id number] Date of Birth: 01-31-1951

## 2021-10-05 ENCOUNTER — Ambulatory Visit: Payer: Medicare Other

## 2021-10-05 ENCOUNTER — Other Ambulatory Visit: Payer: Self-pay

## 2021-10-05 DIAGNOSIS — M6281 Muscle weakness (generalized): Secondary | ICD-10-CM | POA: Diagnosis not present

## 2021-10-05 DIAGNOSIS — M79604 Pain in right leg: Secondary | ICD-10-CM | POA: Diagnosis not present

## 2021-10-05 DIAGNOSIS — R262 Difficulty in walking, not elsewhere classified: Secondary | ICD-10-CM

## 2021-10-05 DIAGNOSIS — G546 Phantom limb syndrome with pain: Secondary | ICD-10-CM

## 2021-10-05 DIAGNOSIS — R2681 Unsteadiness on feet: Secondary | ICD-10-CM | POA: Diagnosis not present

## 2021-10-05 NOTE — Therapy (Addendum)
[facility] [address]. [place], [place], [address] Phone: [phone]   Fax:  [phone]  Physical Therapy Treatment  Patient Details  Name: Sara [name] MRN: [id number] Date of Birth: Dec 27, 1950 Referring Provider (PT): Dr. Delana Meyer   Encounter Date: 10/05/2021   PT End of Session - 10/05/21 1057     Visit Number 8    Number of Visits 25    Date for PT Re-Evaluation 11/28/21    Authorization Type eval: 09/05/21    PT Start Time 1015    PT Stop Time 1102    PT Time Calculation (min) 47 min    Equipment Utilized During Treatment Gait belt    Activity Tolerance Patient tolerated treatment well    Behavior During Therapy [facility] for tasks assessed/performed                Past Medical History:  Diagnosis Date   Arthritis    right shoulder   Depression    Dyspnea    GERD (gastroesophageal reflux disease)    History of blood clots    Hyperlipidemia    Hypertension    Mild mitral regurgitation    Osteoporosis    Peripheral vascular disease ([place])    Stomach ulcer    Vascular disease    Sees Dr. Delana Meyer   Vertigo    Last episode approx Aug 2015   Wears dentures    full upper    Past Surgical History:  Procedure Laterality Date   ADENOIDECTOMY     AMPUTATION Right 08/11/2020   Procedure: AMPUTATION ABOVE KNEE;  Surgeon: [name], MD;  Location: [facility];  Service: Vascular;  Laterality: Right;   ARTERY BIOPSY Right 07/14/2019   Procedure: BIOPSY TEMPORAL ARTERY;  Surgeon: [name], MD;  Location: [facility];  Service: Vascular;  Laterality: Right;   BREAST CYST ASPIRATION Left    CARDIAC CATHETERIZATION  02/15/2017   [facility]   COLONOSCOPY WITH PROPOFOL N/A 10/22/2017   Procedure: COLONOSCOPY WITH PROPOFOL;  Surgeon: Lucilla Lame, MD;  Location: [place];  Service: Endoscopy;  Laterality: N/A;  specimens not taken--pt on Plavix will be brought back in after 7 days off med   COLONOSCOPY  WITH PROPOFOL N/A 11/05/2017   Procedure: COLONOSCOPY WITH PROPOFOL;  Surgeon: Lucilla Lame, MD;  Location: [place];  Service: Endoscopy;  Laterality: N/A;   ESOPHAGOGASTRODUODENOSCOPY N/A 12/21/2014   Procedure: ESOPHAGOGASTRODUODENOSCOPY (EGD);  Surgeon: Lucilla Lame, MD;  Location: [place];  Service: Gastroenterology;  Laterality: N/A;   ESOPHAGOGASTRODUODENOSCOPY (EGD) WITH PROPOFOL N/A 02/08/2016   Procedure: ESOPHAGOGASTRODUODENOSCOPY (EGD) WITH PROPOFOL;  Surgeon: Lucilla Lame, MD;  Location: [facility];  Service: Endoscopy;  Laterality: N/A;   FASCIOTOMY Right 05/30/2019   Procedure: FASCIOTOMY;  Surgeon: [name], MD;  Location: [facility];  Service: Vascular;  Laterality: Right;   FASCIOTOMY CLOSURE Right 06/04/2019   Procedure: FASCIOTOMY CLOSURE;  Surgeon: [name], MD;  Location: [facility];  Service: Vascular;  Laterality: Right;   HEMORROIDECTOMY  2014   LOWER EXTREMITY ANGIOGRAPHY Right 05/30/2019   Procedure: LOWER EXTREMITY ANGIOGRAPHY;  Surgeon: [name], MD;  Location: [place] [facility];  Service: Cardiovascular;  Laterality: Right;   LOWER EXTREMITY ANGIOGRAPHY Right 07/02/2020   Procedure: LOWER EXTREMITY ANGIOGRAPHY;  Surgeon: [name], MD;  Location: [place] [facility];  Service: Cardiovascular;  Laterality: Right;   PERIPHERAL VASCULAR CATHETERIZATION N/A 02/09/2016   Procedure: Abdominal Aortogram w/Lower Extremity;  Surgeon: [name], MD;  Location: [place] [facility];  Service: Cardiovascular;  Laterality: N/A;   PERIPHERAL VASCULAR CATHETERIZATION Right 02/10/2016   Procedure: Lower Extremity Angiography;  Surgeon: [name], MD;  Location: [place] [facility];  Service: Cardiovascular;  Laterality: Right;   POLYPECTOMY  11/05/2017   Procedure: POLYPECTOMY;  Surgeon: Lucilla Lame, MD;  Location: [place];  Service: Endoscopy;;   SHOULDER ARTHROSCOPY WITH ROTATOR CUFF REPAIR AND  SUBACROMIAL DECOMPRESSION Right 02/27/2020   Procedure: RIGHT SHOULDER ARTHROSCOPY SUBACROMIAL DECOMPRESSION, DISTAL CLAVICLE EXCISION AND MINI-OPEN ROTATOR CUFF REPAIR;  Surgeon: Thornton Park, MD;  Location: [facility];  Service: Orthopedics;  Laterality: Right;   TONSILLECTOMY     VASCULAR SURGERY  [id number]   Fem-Pop Bypass    There were no vitals filed for this visit.   Subjective Assessment - 10/05/21 1041     Subjective Pt states she is doing well today. Pt went to [employer] yesterday and did a lot of walking. Pt states that the new socket rubbed her upper thigh and started to irritate it at the end of the day. Pt reported needing to use a wheelchair to get back to the car after the full day of walking. Pt reports no change in phantom pains    Pertinent History Pt reports slight worsening RLE phantom pain since ending therapy. She is taking gabapentin which helps with the pain however it is still very uncomfortable. Pain is continuous and is worse after extended walking with her prosthetic. She has not been performing her HEP after last discharge. She will get a suction fit socket for her RLE prosthesis this Friday.   History from 01/26/21: Pt has a history of RLE vascular issues since 1991. 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 She received her RLE prosthetic in April 2022 and has been receiving [facility] since her surgery. They discharged her yesterday to continue with OP PT. She continues with phantom RLE pain, especially at night and takes gabapentin with minimal relief. She has also been having R shoulder pain since the surgery which she attributes to using a wheelchair. She underwent R RTC surgery in September of 2021 and had physical therapy afterwards. She has been having issues with bilateral carpal tunnel and has a NCV study scheduled for tomorrow.   History from 01/26/21: Pt has a history of RLE vascular issues since 1991. 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 She received her RLE prosthetic in April 2022 and has been receiving [facility] since her surgery. They discharged her yesterday to continue with OP PT. She continues with phantom RLE pain, especially at night and takes gabapentin with minimal relief. She has also been having R shoulder pain since the surgery which she attributes to using a wheelchair. She underwent R RTC surgery in September of 2021 and had physical therapy afterwards. She has been having issues with bilateral carpal tunnel and has a NCV study scheduled for tomorrow.    Limitations Walking    Diagnostic tests see history    Patient Stated Goals Ambulate with her prosthetic                 TREATMENT   Ther-ex With prosthetic doffed: Stairs x 3 sets up and down Supine therapist resisted flexion, ER, IR, adduction 2 x 20 L side-lying abduction attempted, hurt at end of residual limb so stopped after 2 reps  Gait Training Walking over uneven surfaces; grass and mulch Weaving in and out of cones on uneven surface x 4 times through   Neuromuscular Re-education  Mirror therapy performed with patient sitting at edge of chair and mirror between legs to simulate right lower extremity.  While watching the mirror with LLE patient performed seated marches x 1 minute with 3# ankle weight, long arc quads with 3# ankle weight x 1 minute, L ankle AROM plantar flexion/dorsiflexion x 1 minute, L hip abduction/adduction marching x 1 minute, L ankle ball circles CW x 1 minute, L ankle ball circles CCW x 1 minute   Pt educated throughout session about proper posture and technique with exercises. Improved exercise technique, movement at target joints, use of target muscles after min to mod verbal, visual, tactile cues.    Began session with mobility with the prosthesis on. Pt needs minimal cueing for navigation of ascending and descending stairs. Worked on ambulation over uneven  surfaces by walking through the grass and mulch. Pt had loss of balance due to not completely locking out prosthetic prior to fully placing weight on leg. LOB did not result in a fall due to therapist assist. Worked on cognitive tasks while completing the gait work outside in order to challenge pt. Pt occasionally needed to stop moving in order to process cognitive task. Pt is still having pain at end of residual limb while in L side-lying position so was unable to complete therapist resisted hip extension and abduction. Pt would benefit from continued PT services in order to address deficits in strength, balance and pain in order to return to function at home and reduce risk of fall.                          PT Short Term Goals - 09/05/21 1215       PT SHORT TERM GOAL #1   Title Pt will be independent with HEP in order to improve strength and balance as well as decrase pain in order to decrease fall risk and improve pain-free function at home.    Time 6    Period Weeks    Status New    Target Date 10/17/21               PT Long Term Goals - 09/05/21 1217       PT LONG TERM GOAL #1   Title Pt will decrease worst RLE phantom pain by at least 3 points on the NPRS in order to improve pain-free function at home    Baseline 09/05/21: Worst: 7/10    Time 12    Period Weeks    Status New    Target Date 11/28/21      PT LONG TERM GOAL #2   Title Pt will improve ABC by at least 13% in order to demonstrate clinically significant improvement in balance confidence.    Baseline 09/05/21: To be completed    Time 12    Period Weeks    Status New    Target Date 11/28/21      PT LONG TERM GOAL #3   Title Pt will improve FOTO to at least 59 in order to demonstrate clinically significant improvement in function related to her RLE amputation    Baseline 09/05/21: 53    Time 12    Period Weeks    Status New    Target Date 11/28/21  Plan -  10/05/21 1226     Clinical Impression Statement Began session with mobility with the prosthesis on. Pt needs minimal cueing for navigation of ascending and descending stairs. Worked on ambulation over uneven surfaces by walking through the grass and mulch. Pt had loss of balance due to not completely locking out prosthetic prior to fully placing weight on leg. LOB did not result in a fall due to therapist assist. Worked on cognitive tasks while completing the gait work outside in order to challenge pt. Pt occasionally needed to stop moving in order to process cognitive task. Pt is still having pain at end of residual limb while in L side-lying position so was unable to complete therapist resisted hip extension and abduction. Pt would benefit from continued PT services in order to address deficits in strength, balance and pain in order to return to function at home and reduce risk of fall.    Personal Factors and Comorbidities Age;Comorbidity 3+    Comorbidities Depression, osteoporosis, vascular disease    Examination-Activity Limitations Bathing;Locomotion Level;Squat;Stairs;Stand;Transfers    Examination-Participation Restrictions Cleaning;Community Activity;Meal Prep;Yard Work    Stability/Clinical Decision Making Unstable/Unpredictable    Rehab Potential Good    PT Frequency 2x / week    PT Duration 12 weeks    PT Treatment/Interventions ADLs/Self Care Home Management;Aquatic Therapy;Canalith Repostioning;Cryotherapy;Electrical Stimulation;Iontophoresis 4mg /ml Dexamethasone;Moist Heat;Traction;Ultrasound;Gait training;Stair training;Therapeutic activities;Therapeutic exercise;Neuromuscular re-education;Manual techniques;Dry needling;Vestibular;Spinal Manipulations;Joint Manipulations;Balance training;Patient/family education;Cognitive remediation;Passive range of motion;Visual/perceptual remediation/compensation    PT Next Visit Plan complete ABC, mirror therapy, desensitization techniques (STM,  superficial stimulation)    PT Home Exercise Plan Last episode access code: [email or web address]    Consulted and Agree with Plan of Care Patient                  Patient will benefit from skilled therapeutic intervention in order to improve the following deficits and impairments:  Abnormal gait, Decreased strength, Difficulty walking, Pain  Visit Diagnosis: Difficulty in walking, not elsewhere classified  Muscle weakness (generalized)     Problem List Patient Active Problem List   Diagnosis Date Noted   Muscle spasm 09/09/2020   S/P AKA (above knee amputation), right ([place]) 09/08/2020   Ischemia of extremity 07/02/2020   Chronic pain in right shoulder 11/25/2019   Encounter for long-term (current) use of high-risk medication 07/16/2019   GCA (giant cell arteritis) ([place]) 07/16/2019   Temporal arteritis ([place]) 07/13/2019   Postoperative wound infection 06/23/2019   Ischemic leg 05/31/2019   Atherosclerosis of native arteries of extremity with intermittent claudication ([place]) 05/30/2019   Depression 05/29/2019   Eczema of lower extremity 03/04/2018   Chronic venous insufficiency 03/02/2018   Personal history of colonic polyps    Family history of colonic polyps    Benign neoplasm of ascending colon    Mitral valve insufficiency 02/08/2017   Dyspnea on exertion 02/07/2017   Non-rheumatic mitral regurgitation 02/07/2017   Precordial pain 02/07/2017   CAD (coronary artery disease) 12/21/2016   Essential hypertension 12/21/2016   Compression fracture of lumbar spine, non-traumatic ([place]) 04/26/2016   Compression fracture of L3 lumbar vertebra 04/20/2016   PVD (peripheral vascular disease) ([place]) 02/24/2016   Family history of premature CAD 02/24/2016   Gastritis    Other specified diseases of esophagus    Hiatal hernia    Gastritis and gastroduodenitis    Ischemia of lower extremity    Arterial occlusion ([place])    Atherosclerosis of aorta ([place])    History of smoking     Hyperlipidemia  Pain in the chest    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 02/05/2016   Carotid artery stenosis 01/04/2016   Vertigo, benign paroxysmal 12/28/2015   Clinical depression 09/06/2015   History of alcoholism ([place]) 09/06/2015   Angiopathy, 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[place]) 09/06/2015   Atherosclerosis of native arteries of extremity with rest pain ([place]) 09/06/2015   Acute non-recurrent maxillary sinusitis 07/14/2015   Vaginal pruritus 05/26/2015   Chronic recurrent major depressive disorder ([place]) 03/09/2015   Osteoporosis, post-menopausal 03/09/2015   Peripheral blood vessel disorder ([place]) 03/09/2015   Acid reflux 03/09/2015   GERD (gastroesophageal reflux disease) 12/21/2014   Carotid artery narrowing 01/28/2014   Peripheral arterial occlusive disease ([place]) 06/08/2011   Occlusion and stenosis of unspecified carotid artery 06/08/2011   PAD (peripheral artery disease) ([place]) 06/08/2011   Phillips Grout PT, DPT, GCS  Isiah Scheel SPT Huprich,Jason, PT 10/06/2021, 12:42 PM  [facility] [address] [place], [place], [address] Phone: [phone]   Fax:  [phone]  Name: Sara [name] MRN: [id number] Date of Birth: August 13, 1951

## 2021-10-10 ENCOUNTER — Other Ambulatory Visit: Payer: Self-pay

## 2021-10-10 ENCOUNTER — Telehealth (INDEPENDENT_AMBULATORY_CARE_PROVIDER_SITE_OTHER): Payer: Self-pay

## 2021-10-10 ENCOUNTER — Ambulatory Visit: Payer: Medicare Other

## 2021-10-10 DIAGNOSIS — R262 Difficulty in walking, not elsewhere classified: Secondary | ICD-10-CM

## 2021-10-10 DIAGNOSIS — R2681 Unsteadiness on feet: Secondary | ICD-10-CM | POA: Diagnosis not present

## 2021-10-10 DIAGNOSIS — M6281 Muscle weakness (generalized): Secondary | ICD-10-CM | POA: Diagnosis not present

## 2021-10-10 DIAGNOSIS — M79604 Pain in right leg: Secondary | ICD-10-CM | POA: Diagnosis not present

## 2021-10-10 DIAGNOSIS — G546 Phantom limb syndrome with pain: Secondary | ICD-10-CM

## 2021-10-10 NOTE — Telephone Encounter (Signed)
The pt called and left a VM on the nurses line saying that we need to re-submit our part of the [employer] paper work due to it not saying if she is in pt or out pt etc per their company to her. Please advise

## 2021-10-10 NOTE — Therapy (Addendum)
[facility] [address] [place], [place], [address] Phone: [phone]   Fax:  [phone]  Physical Therapy Treatment  Patient Details  Name: Sara [name] MRN: [id number] Date of Birth: 06-30-1951 Referring Provider (PT): Dr. Delana Meyer   Encounter Date: 10/10/2021   PT End of Session - 10/10/21 1026     Visit Number 9    Number of Visits 25    Date for PT Re-Evaluation 11/28/21    Authorization Type eval: 09/05/21    PT Start Time 1014    PT Stop Time 1055    PT Time Calculation (min) 41 min    Equipment Utilized During Treatment Gait belt    Activity Tolerance Patient tolerated treatment well    Behavior During Therapy [facility] for tasks assessed/performed                Past Medical History:  Diagnosis Date   Arthritis    right shoulder   Depression    Dyspnea    GERD (gastroesophageal reflux disease)    History of blood clots    Hyperlipidemia    Hypertension    Mild mitral regurgitation    Osteoporosis    Peripheral vascular disease ([place])    Stomach ulcer    Vascular disease    Sees Dr. Delana Meyer   Vertigo    Last episode approx Aug 2015   Wears dentures    full upper    Past Surgical History:  Procedure Laterality Date   ADENOIDECTOMY     AMPUTATION Right 08/11/2020   Procedure: AMPUTATION ABOVE KNEE;  Surgeon: [name], MD;  Location: [facility];  Service: Vascular;  Laterality: Right;   ARTERY BIOPSY Right 07/14/2019   Procedure: BIOPSY TEMPORAL ARTERY;  Surgeon: [name], MD;  Location: [facility];  Service: Vascular;  Laterality: Right;   BREAST CYST ASPIRATION Left    CARDIAC CATHETERIZATION  02/15/2017   [facility]   COLONOSCOPY WITH PROPOFOL N/A 10/22/2017   Procedure: COLONOSCOPY WITH PROPOFOL;  Surgeon: Lucilla Lame, MD;  Location: [place];  Service: Endoscopy;  Laterality: N/A;  specimens not taken--pt on Plavix will be brought back in after 7 days off med   COLONOSCOPY  WITH PROPOFOL N/A 11/05/2017   Procedure: COLONOSCOPY WITH PROPOFOL;  Surgeon: Lucilla Lame, MD;  Location: [place];  Service: Endoscopy;  Laterality: N/A;   ESOPHAGOGASTRODUODENOSCOPY N/A 12/21/2014   Procedure: ESOPHAGOGASTRODUODENOSCOPY (EGD);  Surgeon: Lucilla Lame, MD;  Location: [place];  Service: Gastroenterology;  Laterality: N/A;   ESOPHAGOGASTRODUODENOSCOPY (EGD) WITH PROPOFOL N/A 02/08/2016   Procedure: ESOPHAGOGASTRODUODENOSCOPY (EGD) WITH PROPOFOL;  Surgeon: Lucilla Lame, MD;  Location: [facility];  Service: Endoscopy;  Laterality: N/A;   FASCIOTOMY Right 05/30/2019   Procedure: FASCIOTOMY;  Surgeon: [name], MD;  Location: [facility];  Service: Vascular;  Laterality: Right;   FASCIOTOMY CLOSURE Right 06/04/2019   Procedure: FASCIOTOMY CLOSURE;  Surgeon: [name], MD;  Location: [facility];  Service: Vascular;  Laterality: Right;   HEMORROIDECTOMY  2014   LOWER EXTREMITY ANGIOGRAPHY Right 05/30/2019   Procedure: LOWER EXTREMITY ANGIOGRAPHY;  Surgeon: [name], MD;  Location: [place] [facility];  Service: Cardiovascular;  Laterality: Right;   LOWER EXTREMITY ANGIOGRAPHY Right 07/02/2020   Procedure: LOWER EXTREMITY ANGIOGRAPHY;  Surgeon: [name], MD;  Location: [place] [facility];  Service: Cardiovascular;  Laterality: Right;   PERIPHERAL VASCULAR CATHETERIZATION N/A 02/09/2016   Procedure: Abdominal Aortogram w/Lower Extremity;  Surgeon: [name], MD;  Location: [place] [facility];  Service: Cardiovascular;  Laterality: N/A;   PERIPHERAL VASCULAR CATHETERIZATION Right 02/10/2016   Procedure: Lower Extremity Angiography;  Surgeon: [name], MD;  Location: [place] [facility];  Service: Cardiovascular;  Laterality: Right;   POLYPECTOMY  11/05/2017   Procedure: POLYPECTOMY;  Surgeon: Lucilla Lame, MD;  Location: [place];  Service: Endoscopy;;   SHOULDER ARTHROSCOPY WITH ROTATOR CUFF REPAIR AND  SUBACROMIAL DECOMPRESSION Right 02/27/2020   Procedure: RIGHT SHOULDER ARTHROSCOPY SUBACROMIAL DECOMPRESSION, DISTAL CLAVICLE EXCISION AND MINI-OPEN ROTATOR CUFF REPAIR;  Surgeon: Thornton Park, MD;  Location: [facility];  Service: Orthopedics;  Laterality: Right;   TONSILLECTOMY     VASCULAR SURGERY  [id number]   Fem-Pop Bypass    There were no vitals filed for this visit.   Subjective Assessment - 10/10/21 1026     Subjective Pt states she is doing great today and has been experiencing less phantom pain lately. Pt reported seeing the prosthetist recently to get extra padding added to prosthetic to prevent irritation on her upper thigh when walking long distances.    Pertinent History Pt reports slight worsening RLE phantom pain since ending therapy. She is taking gabapentin which helps with the pain however it is still very uncomfortable. Pain is continuous and is worse after extended walking with her prosthetic. She has not been performing her HEP after last discharge. She will get a suction fit socket for her RLE prosthesis this Friday.   History from 01/26/21: Pt has a history of RLE vascular issues since 1991. 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 She received her RLE prosthetic in April 2022 and has been receiving [place] PT since her surgery. They discharged her yesterday to continue with OP PT. She continues with phantom RLE pain, especially at night and takes gabapentin with minimal relief. She has also been having R shoulder pain since the surgery which she attributes to using a wheelchair. She underwent R RTC surgery in September of 2021 and had physical therapy afterwards. She has been having issues with bilateral carpal tunnel and has a NCV study scheduled for tomorrow.   History from 01/26/21: Pt has a history of RLE vascular issues since 1991. 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 She received her RLE prosthetic in  April 2022 and has been receiving [facility] [facility] since her surgery. They discharged her yesterday to continue with OP PT. She continues with phantom RLE pain, especially at night and takes gabapentin with minimal relief. She has also been having R shoulder pain since the surgery which she attributes to using a wheelchair. She underwent R RTC surgery in September of 2021 and had physical therapy afterwards. She has been having issues with bilateral carpal tunnel and has a NCV study scheduled for tomorrow.    Limitations Walking    Diagnostic tests see history    Patient Stated Goals Ambulate with her prosthetic                 TREATMENT   Ther-ex With prosthetic: Stairs x 3 sets up and down Weaving through cones in hallway x 3 lengths  Without prosthetic: Supine therapist resisted flexion, ER, IR, adduction, abduction 2 x 20 L side-lying attempted, hurt at end of residual limb so discontinued Bridges w/residual limb on stool 2 x 10    Neuromuscular Re-education  Mirror therapy performed with patient sitting at edge  of chair and mirror between legs to simulate right lower extremity.  While watching the mirror with LLE patient performed seated marches x 1 minute with 3# ankle weight, long arc quads with 3# ankle weight 2 x 45min, L ankle AROM plantar flexion/dorsiflexion x 1 minute, L hip abduction/adduction marching x 1 minute, L ankle ball circles CW x 1 minute, L ankle ball circles CCW x 1 minute   Pt educated throughout session about proper posture and technique with exercises. Improved exercise technique, movement at target joints, use of target muscles after min to mod verbal, visual, tactile cues.     Pt demonstrated great motivation during session today. Began with mobility with the prosthesis on by weaving through cones in the hallway and completing sets of stairs. Pt still has pain at end of residual limb when laying on L side so was unable to complete resisted extension, but was  able to do resisted abduction in supine. Pt denied any pain with bridging on step stool. Pt has stated a minor decrease in phantom limb pain so continued to work on Geologist, engineering therapy. Pt would benefit from PT services in order to address deficits in strength, balance and pain in order to return to full function at home and reduce risk of falls.                            PT Short Term Goals - 09/05/21 1215       PT SHORT TERM GOAL #1   Title Pt will be independent with HEP in order to improve strength and balance as well as decrase pain in order to decrease fall risk and improve pain-free function at home.    Time 6    Period Weeks    Status New    Target Date 10/17/21               PT Long Term Goals - 09/05/21 1217       PT LONG TERM GOAL #1   Title Pt will decrease worst RLE phantom pain by at least 3 points on the NPRS in order to improve pain-free function at home    Baseline 09/05/21: Worst: 7/10    Time 12    Period Weeks    Status New    Target Date 11/28/21      PT LONG TERM GOAL #2   Title Pt will improve ABC by at least 13% in order to demonstrate clinically significant improvement in balance confidence.    Baseline 09/05/21: To be completed    Time 12    Period Weeks    Status New    Target Date 11/28/21      PT LONG TERM GOAL #3   Title Pt will improve FOTO to at least 59 in order to demonstrate clinically significant improvement in function related to her RLE amputation    Baseline 09/05/21: 53    Time 12    Period Weeks    Status New    Target Date 11/28/21                   Plan - 10/10/21 1050     Clinical Impression Statement Pt demonstrated great motivation during session today. Began with mobility with the prosthesis on by weaving through cones in the hallway and completing sets of stairs. Pt still has pain at end of residual limb when laying on L side so was unable to complete  resisted extension, but was able to do  resisted abduction in supine. Pt denied any pain with bridging on step stool. Pt has stated a minor decrease in phantom limb pain so continued to work on Geologist, engineering therapy. Pt would benefit from PT services in order to address deficits in strength, balance and pain in order to return to full function at home and reduce risk of falls.    Personal Factors and Comorbidities Age;Comorbidity 3+    Comorbidities Depression, osteoporosis, vascular disease    Examination-Activity Limitations Bathing;Locomotion Level;Squat;Stairs;Stand;Transfers    Examination-Participation Restrictions Cleaning;Community Activity;Meal Prep;Yard Work    Stability/Clinical Decision Making Unstable/Unpredictable    Rehab Potential Good    PT Frequency 2x / week    PT Duration 12 weeks    PT Treatment/Interventions ADLs/Self Care Home Management;Aquatic Therapy;Canalith Repostioning;Cryotherapy;Electrical Stimulation;Iontophoresis 4mg /ml Dexamethasone;Moist Heat;Traction;Ultrasound;Gait training;Stair training;Therapeutic activities;Therapeutic exercise;Neuromuscular re-education;Manual techniques;Dry needling;Vestibular;Spinal Manipulations;Joint Manipulations;Balance training;Patient/family education;Cognitive remediation;Passive range of motion;Visual/perceptual remediation/compensation    PT Next Visit Plan Update outcome measures/goals, progress note, mirror therapy, desensitization techniques (STM, superficial stimulation)    PT Home Exercise Plan Last episode access code: [email or web address]    Consulted and Agree with Plan of Care Patient                   Patient will benefit from skilled therapeutic intervention in order to improve the following deficits and impairments:  Abnormal gait, Decreased strength, Difficulty walking, Pain  Visit Diagnosis: Muscle weakness (generalized)  Difficulty in walking, not elsewhere classified  Phantom limb syndrome with pain [facility])     Problem List Patient Active Problem List    Diagnosis Date Noted   Muscle spasm 09/09/2020   S/P AKA (above knee amputation), right ([place]) 09/08/2020   Ischemia of extremity 07/02/2020   Chronic pain in right shoulder 11/25/2019   Encounter for long-term (current) use of high-risk medication 07/16/2019   GCA (giant cell arteritis) ([place]) 07/16/2019   Temporal arteritis ([place]) 07/13/2019   Postoperative wound infection 06/23/2019   Ischemic leg 05/31/2019   Atherosclerosis of native arteries of extremity with intermittent claudication ([place]) 05/30/2019   Depression 05/29/2019   Eczema of lower extremity 03/04/2018   Chronic venous insufficiency 03/02/2018   Personal history of colonic polyps    Family history of colonic polyps    Benign neoplasm of ascending colon    Mitral valve insufficiency 02/08/2017   Dyspnea on exertion 02/07/2017   Non-rheumatic mitral regurgitation 02/07/2017   Precordial pain 02/07/2017   CAD (coronary artery disease) 12/21/2016   Essential hypertension 12/21/2016   Compression fracture of lumbar spine, non-traumatic ([place]) 04/26/2016   Compression fracture of L3 lumbar vertebra 04/20/2016   PVD (peripheral vascular disease) ([place]) 02/24/2016   Family history of premature CAD 02/24/2016   Gastritis    Other specified diseases of esophagus    Hiatal hernia    Gastritis and gastroduodenitis    Ischemia of lower extremity    Arterial occlusion (H[facility])    Atherosclerosis of aorta ([place])    History of smoking    Hyperlipidemia    Pain in the chest    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 02/05/2016   Carotid artery stenosis 01/04/2016   Vertigo, benign paroxysmal 12/28/2015   Clinical depression 09/06/2015   History of alcoholism ([place]) 09/06/2015   Angiopathy, 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[place]) 09/06/2015   Atherosclerosis of native arteries of extremity with rest pain ([place]) 09/06/2015   Acute non-recurrent maxillary sinusitis 07/14/2015   Vaginal pruritus 05/26/2015   Chronic recurrent  major  depressive disorder ([place]) 03/09/2015   Osteoporosis, post-menopausal 03/09/2015   Peripheral blood vessel disorder ([place]) 03/09/2015   Acid reflux 03/09/2015   GERD (gastroesophageal reflux disease) 12/21/2014   Carotid artery narrowing 01/28/2014   Peripheral arterial occlusive disease ([place]) 06/08/2011   Occlusion and stenosis of unspecified carotid artery 06/08/2011   PAD (peripheral artery disease) ([place]) 06/08/2011   Phillips Grout PT, DPT, GCS  Kaedin Hicklin SPT Huprich,Jason, PT 10/11/2021, 3:44 PM  [facility] [address] [place], [place], [address] Phone: [phone]   Fax:  [phone]  Name: Sara [name] MRN: [id number] Date of Birth: Dec 20, 1950   [facility] [address] [place], [place], [address] Phone: [phone]   Fax:  [phone]  Physical Therapy Treatment  Patient Details  Name: Sara [name] MRN: [id number] Date of Birth: [birth year] Referring Provider (PT): Dr. Delana Meyer   Encounter Date: 10/10/2021   PT End of Session - 10/10/21 1026     Visit Number 9    Number of Visits 25    Date for PT Re-Evaluation 11/28/21    Authorization Type eval: 09/05/21    PT Start Time 1014    PT Stop Time 1055    PT Time Calculation (min) 41 min    Equipment Utilized During Treatment Gait belt    Activity Tolerance Patient tolerated treatment well    Behavior During Therapy [facility] for tasks assessed/performed             Past Medical History:  Diagnosis Date   Arthritis    right shoulder   Depression    Dyspnea    GERD (gastroesophageal reflux disease)    History of blood clots    Hyperlipidemia    Hypertension    Mild mitral regurgitation    Osteoporosis    Peripheral vascular disease ([place])    Stomach ulcer    Vascular disease    Sees Dr. Delana Meyer   Vertigo    Last episode approx Aug 2015   Wears dentures    full upper    Past Surgical  History:  Procedure Laterality Date   ADENOIDECTOMY     AMPUTATION Right 08/11/2020   Procedure: AMPUTATION ABOVE KNEE;  Surgeon: [name], MD;  Location: [facility];  Service: Vascular;  Laterality: Right;   ARTERY BIOPSY Right 07/14/2019   Procedure: BIOPSY TEMPORAL ARTERY;  Surgeon: [name], MD;  Location: [facility];  Service: Vascular;  Laterality: Right;   BREAST CYST ASPIRATION Left    CARDIAC CATHETERIZATION  02/15/2017   [facility]   COLONOSCOPY WITH PROPOFOL N/A 10/22/2017   Procedure: COLONOSCOPY WITH PROPOFOL;  Surgeon: Lucilla Lame, MD;  Location: [place];  Service: Endoscopy;  Laterality: N/A;  specimens not taken--pt on Plavix will be brought back in after 7 days off med   COLONOSCOPY WITH PROPOFOL N/A 11/05/2017   Procedure: COLONOSCOPY WITH PROPOFOL;  Surgeon: Lucilla Lame, MD;  Location: [place];  Service: Endoscopy;  Laterality: N/A;   ESOPHAGOGASTRODUODENOSCOPY N/A 12/21/2014   Procedure: ESOPHAGOGASTRODUODENOSCOPY (EGD);  Surgeon: Lucilla Lame, MD;  Location: [place];  Service: Gastroenterology;  Laterality: N/A;   ESOPHAGOGASTRODUODENOSCOPY (EGD) WITH PROPOFOL N/A 02/08/2016   Procedure: ESOPHAGOGASTRODUODENOSCOPY (EGD) WITH PROPOFOL;  Surgeon: Lucilla Lame, MD;  Location: [facility];  Service: Endoscopy;  Laterality: N/A;   FASCIOTOMY Right 05/30/2019   Procedure: FASCIOTOMY;  Surgeon: [name], MD;  Location: [facility];  Service: Vascular;  Laterality: Right;   FASCIOTOMY CLOSURE Right 06/04/2019   Procedure: FASCIOTOMY CLOSURE;  Surgeon: [name], MD;  Location: [facility];  Service: Vascular;  Laterality: Right;   HEMORROIDECTOMY  2014   LOWER EXTREMITY ANGIOGRAPHY Right 05/30/2019   Procedure: LOWER EXTREMITY ANGIOGRAPHY;  Surgeon: [name], MD;  Location: [place] [facility];  Service: Cardiovascular;  Laterality: Right;   LOWER EXTREMITY ANGIOGRAPHY Right 07/02/2020   Procedure: LOWER EXTREMITY  ANGIOGRAPHY;  Surgeon: [name], MD;  Location: [place] [facility];  Service: Cardiovascular;  Laterality: Right;   PERIPHERAL VASCULAR CATHETERIZATION N/A 02/09/2016   Procedure: Abdominal Aortogram w/Lower Extremity;  Surgeon: [name], MD;  Location: [place] [facility];  Service: Cardiovascular;  Laterality: N/A;   PERIPHERAL VASCULAR CATHETERIZATION Right 02/10/2016   Procedure: Lower Extremity Angiography;  Surgeon: [name], MD;  Location: [place] [facility];  Service: Cardiovascular;  Laterality: Right;   POLYPECTOMY  11/05/2017   Procedure: POLYPECTOMY;  Surgeon: Lucilla Lame, MD;  Location: [place];  Service: Endoscopy;;   SHOULDER ARTHROSCOPY WITH ROTATOR CUFF REPAIR AND SUBACROMIAL DECOMPRESSION Right 02/27/2020   Procedure: RIGHT SHOULDER ARTHROSCOPY SUBACROMIAL DECOMPRESSION, DISTAL CLAVICLE EXCISION AND MINI-OPEN ROTATOR CUFF REPAIR;  Surgeon: Thornton Park, MD;  Location: [facility];  Service: Orthopedics;  Laterality: Right;   TONSILLECTOMY     VASCULAR SURGERY  0354,6568   Fem-Pop Bypass    There were no vitals filed for this visit.   Subjective Assessment - 10/10/21 1026     Subjective Pt states she is doing great today and has been experiencing less phantom pain lately. Pt reported seeing the prosthetist recently to get extra padding added to prosthetic to prevent irritation on her upper thigh when walking long distances.    Pertinent History Pt reports slight worsening RLE phantom pain since ending therapy. She is taking gabapentin which helps with the pain however it is still very uncomfortable. Pain is continuous and is worse after extended walking with her prosthetic. She has not been performing her HEP after last discharge. She will get a suction fit socket for her RLE prosthesis this Friday.   History from 01/26/21: Pt has a history of RLE vascular issues since 1991. She had multiple vascular surgeries since that time resulting in R  AKA 08/11/20. She received her RLE prosthetic in April 2022 and has been receiving [place] PT since her surgery. They discharged her yesterday to continue with OP PT. She continues with phantom RLE pain, especially at night and takes gabapentin with minimal relief. She has also been having R shoulder pain since the surgery which she attributes to using a wheelchair. She underwent R RTC surgery in September of 2021 and had physical therapy afterwards. She has been having issues with bilateral carpal tunnel and has a NCV study scheduled for tomorrow.   History from 01/26/21: Pt has a history of RLE vascular issues since 1991. 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 She received her RLE prosthetic in April 2022 and has been receiving [facility] since her surgery. They discharged her yesterday to continue with OP PT. She continues with phantom RLE pain, especially at night and takes gabapentin with minimal relief. She has also been having R shoulder pain since the surgery which she attributes to using a wheelchair. She underwent R RTC surgery in September of 2021 and had physical therapy afterwards. She has been having issues with bilateral carpal tunnel  and has a NCV study scheduled for tomorrow.    Limitations Walking    Diagnostic tests see history    Patient Stated Goals Ambulate with her prosthetic                                          PT Short Term Goals - 09/05/21 1215       PT SHORT TERM GOAL #1   Title Pt will be independent with HEP in order to improve strength and balance as well as decrase pain in order to decrease fall risk and improve pain-free function at home.    Time 6    Period Weeks    Status New    Target Date 10/17/21               PT Long Term Goals - 09/05/21 1217       PT LONG TERM GOAL #1   Title Pt will decrease worst RLE phantom pain by at least 3 points on the NPRS in order to improve pain-free  function at home    Baseline 09/05/21: Worst: 7/10    Time 12    Period Weeks    Status New    Target Date 11/28/21      PT LONG TERM GOAL #2   Title Pt will improve ABC by at least 13% in order to demonstrate clinically significant improvement in balance confidence.    Baseline 09/05/21: To be completed    Time 12    Period Weeks    Status New    Target Date 11/28/21      PT LONG TERM GOAL #3   Title Pt will improve FOTO to at least 59 in order to demonstrate clinically significant improvement in function related to her RLE amputation    Baseline 09/05/21: 53    Time 12    Period Weeks    Status New    Target Date 11/28/21                   Plan - 10/10/21 1050     Clinical Impression Statement Pt demonstrated great motivation during session today. Began with mobility with the prosthesis on by weaving through cones in the hallway and completing sets of stairs. Pt still has pain at end of residual limb when laying on L side so was unable to complete resisted extension, but was able to do resisted abduction in supine. Pt denied any pain with bridging on step stool. Pt has stated a minor decrease in phantom limb pain so continued to work on Geologist, engineering therapy. Pt would benefit from PT services in order to address deficits in strength, balance and pain in order to return to full function at home and reduce risk of falls.    Personal Factors and Comorbidities Age;Comorbidity 3+    Comorbidities Depression, osteoporosis, vascular disease    Examination-Activity Limitations Bathing;Locomotion Level;Squat;Stairs;Stand;Transfers    Examination-Participation Restrictions Cleaning;Community Activity;Meal Prep;Yard Work    Stability/Clinical Decision Making Unstable/Unpredictable    Rehab Potential Good    PT Frequency 2x / week    PT Duration 12 weeks    PT Treatment/Interventions ADLs/Self Care Home Management;Aquatic Therapy;Canalith Repostioning;Cryotherapy;Electrical  Stimulation;Iontophoresis 4mg /ml Dexamethasone;Moist Heat;Traction;Ultrasound;Gait training;Stair training;Therapeutic activities;Therapeutic exercise;Neuromuscular re-education;Manual techniques;Dry needling;Vestibular;Spinal Manipulations;Joint Manipulations;Balance training;Patient/family education;Cognitive remediation;Passive range of motion;Visual/perceptual remediation/compensation    PT Next Visit Plan Update outcome measures/goals, progress note, mirror therapy,  desensitization techniques (STM, superficial stimulation)    PT Home Exercise Plan Last episode access code: [email or web address]    Consulted and Agree with Plan of Care Patient             Patient will benefit from skilled therapeutic intervention in order to improve the following deficits and impairments:  Abnormal gait, Decreased strength, Difficulty walking, Pain  Visit Diagnosis: Muscle weakness (generalized)  Difficulty in walking, not elsewhere classified  Phantom limb syndrome with pain [facility])     Problem List Patient Active Problem List   Diagnosis Date Noted   Muscle spasm 09/09/2020   S/P AKA (above knee amputation), right ([place]) 09/08/2020   Ischemia of extremity 07/02/2020   Chronic pain in right shoulder 11/25/2019   Encounter for long-term (current) use of high-risk medication 07/16/2019   GCA (giant cell arteritis) ([place]) 07/16/2019   Temporal arteritis ([place]) 07/13/2019   Postoperative wound infection 06/23/2019   Ischemic leg 05/31/2019   Atherosclerosis of native arteries of extremity with intermittent claudication ([place]) 05/30/2019   Depression 05/29/2019   Eczema of lower extremity 03/04/2018   Chronic venous insufficiency 03/02/2018   Personal history of colonic polyps    Family history of colonic polyps    Benign neoplasm of ascending colon    Mitral valve insufficiency 02/08/2017   Dyspnea on exertion 02/07/2017   Non-rheumatic mitral regurgitation 02/07/2017   Precordial pain 02/07/2017   CAD  (coronary artery disease) 12/21/2016   Essential hypertension 12/21/2016   Compression fracture of lumbar spine, non-traumatic ([place]) 04/26/2016   Compression fracture of L3 lumbar vertebra 04/20/2016   PVD (peripheral vascular disease) ([place]) 02/24/2016   Family history of premature CAD 02/24/2016   Gastritis    Other specified diseases of esophagus    Hiatal hernia    Gastritis and gastroduodenitis    Ischemia of lower extremity    Arterial occlusion (HCC)    Atherosclerosis of aorta (HCC)    History of smoking    Hyperlipidemia    Pain in the chest    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 02/05/2016   Carotid artery stenosis 01/04/2016   Vertigo, benign paroxysmal 12/28/2015   Clinical depression 09/06/2015   History of alcoholism ([place]) 09/06/2015   Angiopathy, 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[place]) 09/06/2015   Atherosclerosis of native arteries of extremity with rest pain ([place]) 09/06/2015   Acute non-recurrent maxillary sinusitis 07/14/2015   Vaginal pruritus 05/26/2015   Chronic recurrent major depressive disorder ([place]) 03/09/2015   Osteoporosis, 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 [place], [place], [address] Phone: [phone]   Fax:  [phone]  Name: Sara [name] MRN: [id number] Date of Birth: 1951-04-05

## 2021-10-10 NOTE — Telephone Encounter (Signed)
Where is her paperwork ?

## 2021-10-11 NOTE — Telephone Encounter (Signed)
Paperwork has been refaxed 

## 2021-10-12 ENCOUNTER — Other Ambulatory Visit: Payer: Self-pay

## 2021-10-12 ENCOUNTER — Encounter: Payer: Self-pay | Admitting: Physical Therapy

## 2021-10-12 ENCOUNTER — Ambulatory Visit: Payer: Medicare Other | Admitting: Physical Therapy

## 2021-10-12 DIAGNOSIS — R2681 Unsteadiness on feet: Secondary | ICD-10-CM

## 2021-10-12 DIAGNOSIS — M6281 Muscle weakness (generalized): Secondary | ICD-10-CM | POA: Diagnosis not present

## 2021-10-12 DIAGNOSIS — R262 Difficulty in walking, not elsewhere classified: Secondary | ICD-10-CM | POA: Diagnosis not present

## 2021-10-12 DIAGNOSIS — M79604 Pain in right leg: Secondary | ICD-10-CM | POA: Diagnosis not present

## 2021-10-12 DIAGNOSIS — G546 Phantom limb syndrome with pain: Secondary | ICD-10-CM | POA: Diagnosis not present

## 2021-10-12 NOTE — Therapy (Addendum)
[facility] [address] [place], [place], [address] Phone: [phone]   Fax:  [phone]  Physical Therapy Treatment Physical Therapy Progress Note   Dates of reporting period  09/05/2021 to  10/12/2021  Patient Details  Name: Sara [name] MRN: [id number] Date of Birth: May 08, 1951 Referring Provider (PT): Dr. Delana Meyer   Encounter Date: 10/12/2021   PT End of Session - 10/12/21 0952     Visit Number 10    Number of Visits 25    Date for PT Re-Evaluation 11/28/21    Authorization Type eval: 09/05/21    PT Start Time 1000    PT Stop Time 1045    PT Time Calculation (min) 45 min    Equipment Utilized During Treatment Gait belt    Activity Tolerance Patient tolerated treatment well    Behavior During Therapy [facility] for tasks assessed/performed                Past Medical History:  Diagnosis Date   Arthritis    right shoulder   Depression    Dyspnea    GERD (gastroesophageal reflux disease)    History of blood clots    Hyperlipidemia    Hypertension    Mild mitral regurgitation    Osteoporosis    Peripheral vascular disease ([place])    Stomach ulcer    Vascular disease    Sees Dr. Delana Meyer   Vertigo    Last episode approx Aug 2015   Wears dentures    full upper    Past Surgical History:  Procedure Laterality Date   ADENOIDECTOMY     AMPUTATION Right 08/11/2020   Procedure: AMPUTATION ABOVE KNEE;  Surgeon: [name], MD;  Location: [facility];  Service: Vascular;  Laterality: Right;   ARTERY BIOPSY Right 07/14/2019   Procedure: BIOPSY TEMPORAL ARTERY;  Surgeon: [name], MD;  Location: [facility];  Service: Vascular;  Laterality: Right;   BREAST CYST ASPIRATION Left    CARDIAC CATHETERIZATION  02/15/2017   [facility]   COLONOSCOPY WITH PROPOFOL N/A 10/22/2017   Procedure: COLONOSCOPY WITH PROPOFOL;  Surgeon: Lucilla Lame, MD;  Location: [place];  Service: Endoscopy;  Laterality: N/A;   specimens not taken--pt on Plavix will be brought back in after 7 days off med   COLONOSCOPY WITH PROPOFOL N/A 11/05/2017   Procedure: COLONOSCOPY WITH PROPOFOL;  Surgeon: Lucilla Lame, MD;  Location: [place];  Service: Endoscopy;  Laterality: N/A;   ESOPHAGOGASTRODUODENOSCOPY N/A 12/21/2014   Procedure: ESOPHAGOGASTRODUODENOSCOPY (EGD);  Surgeon: Lucilla Lame, MD;  Location: [place];  Service: Gastroenterology;  Laterality: N/A;   ESOPHAGOGASTRODUODENOSCOPY (EGD) WITH PROPOFOL N/A 02/08/2016   Procedure: ESOPHAGOGASTRODUODENOSCOPY (EGD) WITH PROPOFOL;  Surgeon: Lucilla Lame, MD;  Location: [facility];  Service: Endoscopy;  Laterality: N/A;   FASCIOTOMY Right 05/30/2019   Procedure: FASCIOTOMY;  Surgeon: [name], MD;  Location: [facility];  Service: Vascular;  Laterality: Right;   FASCIOTOMY CLOSURE Right 06/04/2019   Procedure: FASCIOTOMY CLOSURE;  Surgeon: [name], MD;  Location: [facility];  Service: Vascular;  Laterality: Right;   HEMORROIDECTOMY  2014   LOWER EXTREMITY ANGIOGRAPHY Right 05/30/2019   Procedure: LOWER EXTREMITY ANGIOGRAPHY;  Surgeon: [name], MD;  Location: [place] [facility];  Service: Cardiovascular;  Laterality: Right;   LOWER EXTREMITY ANGIOGRAPHY Right 07/02/2020   Procedure: LOWER EXTREMITY ANGIOGRAPHY;  Surgeon: [name], MD;  Location: [place] [facility];  Service: Cardiovascular;  Laterality:  Right;   PERIPHERAL VASCULAR CATHETERIZATION N/A 02/09/2016   Procedure: Abdominal Aortogram w/Lower Extremity;  Surgeon: [name], MD;  Location: [place] [facility];  Service: Cardiovascular;  Laterality: N/A;   PERIPHERAL VASCULAR CATHETERIZATION Right 02/10/2016   Procedure: Lower Extremity Angiography;  Surgeon: [name], MD;  Location: [place] [facility];  Service: Cardiovascular;  Laterality: Right;   POLYPECTOMY  11/05/2017   Procedure: POLYPECTOMY;  Surgeon: Lucilla Lame, MD;  Location:  [place];  Service: Endoscopy;;   SHOULDER ARTHROSCOPY WITH ROTATOR CUFF REPAIR AND SUBACROMIAL DECOMPRESSION Right 02/27/2020   Procedure: RIGHT SHOULDER ARTHROSCOPY SUBACROMIAL DECOMPRESSION, DISTAL CLAVICLE EXCISION AND MINI-OPEN ROTATOR CUFF REPAIR;  Surgeon: Thornton Park, MD;  Location: [facility];  Service: Orthopedics;  Laterality: Right;   TONSILLECTOMY     VASCULAR SURGERY  0350,0938   Fem-Pop Bypass    There were no vitals filed for this visit.   Subjective Assessment - 10/12/21 0953     Subjective Pt states she is doing okay today. Pt reports experiencing a flare up of her shoulder pain today. Pt reports going for a walk last night up and down the sidewalk with her friend. Pt wants to work on her stamina and endurance so she can start walking further distances. Phantom limb pain is currently 7/10 and feels like a stabbing in her foot.    Pertinent History Pt reports slight worsening RLE phantom pain since ending therapy. She is taking gabapentin which helps with the pain however it is still very uncomfortable. Pain is continuous and is worse after extended walking with her prosthetic. She has not been performing her HEP after last discharge. She will get a suction fit socket for her RLE prosthesis this Friday.   History from 01/26/21: Pt has a history of RLE vascular issues since 1991. 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 She received her RLE prosthetic in April 2022 and has been receiving [facility] since her surgery. They discharged her yesterday to continue with OP PT. She continues with phantom RLE pain, especially at night and takes gabapentin with minimal relief. She has also been having R shoulder pain since the surgery which she attributes to using a wheelchair. She underwent R RTC surgery in September of 2021 and had physical therapy afterwards. She has been having issues with bilateral carpal tunnel and has a NCV study scheduled for  tomorrow.   History from 01/26/21: Pt has a history of RLE vascular issues since 1991. 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 She received her RLE prosthetic in April 2022 and has been receiving [facility] since her surgery. They discharged her yesterday to continue with OP PT. She continues with phantom RLE pain, especially at night and takes gabapentin with minimal relief. She has also been having R shoulder pain since the surgery which she attributes to using a wheelchair. She underwent R RTC surgery in September of 2021 and had physical therapy afterwards. She has been having issues with bilateral carpal tunnel and has a NCV study scheduled for tomorrow.    Limitations Walking    Diagnostic tests see history    Patient Stated Goals Ambulate with her prosthetic                 TREATMENT  Goals: NPRS worst pain 7/10 FOTO: 58 ABC: 92.5%  Ther-ex With prosthetic: Walking over uneven surfaces; grass and mulch  Up and down from  a 3" curb x 2 Weaving through cones on grass x 6 Stairs x 3 Marching in // bars x 10 Lateral walking in // bars x 4 lengths Functional bag carry 5# while ambulating x 2 laps around gym Practice getting set up on the nustep, then x 5 min at L0  Pt educated throughout session about proper posture and technique with exercises. Improved exercise technique, movement at target joints, use of target muscles after min to mod verbal, visual, tactile cues.    Pt stated she wanted to build her stamina so focused session on standing and ambulation tasks with prosthesis on. No LOB at today's session with walking over uneven surfaces and changes in surfaces. Pt required frequent sitting breaks due to  increased amounts of walking. Checked and updated goals today. RLE phantom pain has stayed the same from 09/15/21 which is a 7/10 on the NPRS. Phantom pain can fluctuate, but at least 1x/day pt reports having 7/10 phantom limb pain. ABC score  has slightly dropped, but not within a significant amount, it went from 97.5% on 07/11/21 to 92.5% today. Pt's FOTO score is a 58 which is similar to the previous scores, but has not yet reached the intended target of 59. New goal added based on stamina and endurance. Pt wants to begin using stationary bike at her apartment complex so finished session with instructing her on how to operate the nustep. Pt would benefit from continued PT services to address deficits in endurance, strength, and balance in order to return to full function at home and reduce risk of falls.     PT Short Term Goals - 09/05/21 1215       PT SHORT TERM GOAL #1   Title Pt will be independent with HEP in order to improve strength and balance as well as decrase pain in order to decrease fall risk and improve pain-free function at home.    Time 6    Period Weeks    Status New    Target Date 10/17/21               PT Long Term Goals - 10/12/21 1101       PT LONG TERM GOAL #1   Title Pt will decrease worst RLE phantom pain by at least 3 points on the NPRS in order to improve pain-free function at home    Baseline 09/05/21: Worst: 7/10    Time 12    Period Weeks    Status Partially Met    Target Date 11/28/21      PT LONG TERM GOAL #2   Title Pt will improve ABC by at least 13% in order to demonstrate clinically significant improvement in balance confidence.    Baseline 09/05/21: To be completed 10/12/21: 92.7%    Time 12    Period Weeks    Status Partially Met    Target Date 11/28/21      PT LONG TERM GOAL #3   Title Pt will improve FOTO to at least 59 in order to demonstrate clinically significant improvement in function related to her RLE amputation    Baseline 09/05/21: 53 10/12/21: 58    Time 12    Period Weeks    Status Partially Met    Target Date 11/28/21      PT LONG TERM GOAL #4   Title Pt will be able to walk for 10 minutes while keeping RPE below 4 without needing to sit for purposes of  building cardiovascular  endurance and being able to functional ambulate throughout the community for tasks such as shopping    Time 4    Period Weeks    Status On-going    Target Date 11/09/21                   Plan - 10/12/21 1108     Clinical Impression Statement Pt stated she wanted to build her stamina so focused session on standing and ambulation tasks with prosthesis on. No LOB at today's session with walking over uneven surfaces and changes in surfaces. Pt required frequent sitting breaks due to  increased amounts of walking. Checked and updated goals today. RLE phantom pain has stayed the same from 09/15/21 which is a 7/10 on the NPRS. Phantom pain can fluctuate, but at least 1x/day pt reports having 7/10 phantom limb pain. ABC score has slightly dropped, but not within a significant amount, it went from 97.5% on 07/11/21 to 92.5% today. Pt's FOTO score is a 58 which is similar to the previous scores, but has not yet reached the intended target of 59. New goal added based on stamina and endurance. Pt wants to begin using stationary bike at her apartment complex so finished session with instructing her on how to operate the nustep. Pt would benefit from continued PT services to address deficits in endurance, strength, and balance in order to return to full function at home and reduce risk of falls.    Personal Factors and Comorbidities Age;Comorbidity 3+    Comorbidities Depression, osteoporosis, vascular disease    Examination-Activity Limitations Bathing;Locomotion Level;Squat;Stairs;Stand;Transfers    Examination-Participation Restrictions Cleaning;Community Activity;Meal Prep;Yard Work    Stability/Clinical Decision Making Unstable/Unpredictable    Rehab Potential Good    PT Frequency 2x / week    PT Duration 12 weeks    PT Treatment/Interventions ADLs/Self Care Home Management;Aquatic Therapy;Canalith Repostioning;Cryotherapy;Electrical Stimulation;Iontophoresis 65m/ml  Dexamethasone;Moist Heat;Traction;Ultrasound;Gait training;Stair training;Therapeutic activities;Therapeutic exercise;Neuromuscular re-education;Manual techniques;Dry needling;Vestibular;Spinal Manipulations;Joint Manipulations;Balance training;Patient/family education;Cognitive remediation;Passive range of motion;Visual/perceptual remediation/compensation    PT Next Visit Plan Update outcome measures/goals, progress note, mirror therapy, desensitization techniques (STM, superficial stimulation)    PT Home Exercise Plan Last episode access code: [email or web address]    Consulted and Agree with Plan of Care Patient               Patient will benefit from skilled therapeutic intervention in order to improve the following deficits and impairments:  Abnormal gait, Decreased strength, Difficulty walking, Pain  Visit Diagnosis: Difficulty in walking, not elsewhere classified  Unsteadiness on feet  Muscle weakness (generalized)     Problem List Patient Active Problem List   Diagnosis Date Noted   Muscle spasm 09/09/2020   S/P AKA (above knee amputation), right ([place] 09/08/2020   Ischemia of extremity 07/02/2020   Chronic pain in right shoulder 11/25/2019   Encounter for long-term (current) use of high-risk medication 07/16/2019   GCA (giant cell arteritis) ([place] 07/16/2019   Temporal arteritis ([place] 07/13/2019   Postoperative wound infection 06/23/2019   Ischemic leg 05/31/2019   Atherosclerosis of native arteries of extremity with intermittent claudication ([place] 05/30/2019   Depression 05/29/2019   Eczema of lower extremity 03/04/2018   Chronic venous insufficiency 03/02/2018   Personal history of colonic polyps    Family history of colonic polyps    Benign neoplasm of ascending colon    Mitral valve insufficiency 02/08/2017   Dyspnea on exertion 02/07/2017   Non-rheumatic mitral regurgitation 02/07/2017   Precordial pain 02/07/2017   CAD (coronary  artery disease) 12/21/2016    Essential hypertension 12/21/2016   Compression fracture of lumbar spine, non-traumatic ([place]) 04/26/2016   Compression fracture of L3 lumbar vertebra 04/20/2016   PVD (peripheral vascular disease) ([place]) 02/24/2016   Family history of premature CAD 02/24/2016   Gastritis    Other specified diseases of esophagus    Hiatal hernia    Gastritis and gastroduodenitis    Ischemia of lower extremity    Arterial occlusion (HCC)    Atherosclerosis of aorta (HCC)    History of smoking    Hyperlipidemia    Pain in the chest    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 02/05/2016   Carotid artery stenosis 01/04/2016   Vertigo, benign paroxysmal 12/28/2015   Clinical depression 09/06/2015   History of alcoholism ([place]) 09/06/2015   Angiopathy, 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[place]) 09/06/2015   Atherosclerosis of native arteries of extremity with rest pain ([place]) 09/06/2015   Acute non-recurrent maxillary sinusitis 07/14/2015   Vaginal pruritus 05/26/2015   Chronic recurrent major depressive disorder ([place]) 03/09/2015   Osteoporosis, 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 [place], [place], [address] Phone: [phone]   Fax:  [phone]  Name: Sara [name] MRN: [id number] Date of Birth: May 17, 1951   [facility] [address] [place], [place], [address] Phone: [phone]   Fax:  [phone]   Name: Sara [name] MRN: [id number] Date of Birth: [birth year]

## 2021-10-13 ENCOUNTER — Encounter (INDEPENDENT_AMBULATORY_CARE_PROVIDER_SITE_OTHER): Payer: Self-pay | Admitting: Vascular Surgery

## 2021-10-13 ENCOUNTER — Ambulatory Visit (INDEPENDENT_AMBULATORY_CARE_PROVIDER_SITE_OTHER): Payer: Medicare Other | Admitting: Vascular Surgery

## 2021-10-13 VITALS — BP 123/70 | HR 80 | Ht 63.0 in | Wt 122.0 lb

## 2021-10-13 DIAGNOSIS — Z79899 Other long term (current) drug therapy: Secondary | ICD-10-CM | POA: Diagnosis not present

## 2021-10-13 DIAGNOSIS — I779 Disorder of arteries and arterioles, unspecified: Secondary | ICD-10-CM | POA: Diagnosis not present

## 2021-10-13 DIAGNOSIS — I25119 Atherosclerotic heart disease of native coronary artery with unspecified angina pectoris: Secondary | ICD-10-CM | POA: Diagnosis not present

## 2021-10-13 DIAGNOSIS — I6521 Occlusion and stenosis of right carotid artery: Secondary | ICD-10-CM

## 2021-10-13 DIAGNOSIS — Z89611 Acquired absence of right leg above knee: Secondary | ICD-10-CM | POA: Diagnosis not present

## 2021-10-13 DIAGNOSIS — M316 Other giant cell arteritis: Secondary | ICD-10-CM | POA: Diagnosis not present

## 2021-10-13 DIAGNOSIS — I10 Essential (primary) hypertension: Secondary | ICD-10-CM

## 2021-10-13 MED ORDER — GABAPENTIN 300 MG PO CAPS: ORAL_CAPSULE | ORAL | 3 refills | Status: DC

## 2021-10-13 NOTE — Progress Notes (Signed)
MRN : [id number]  Sara [name] is a 71 y.o. (13-Aug-1951) female who presents with chief complaint of check AKA.  History of Present Illness:  The patient returns to the office sooner than expected with complaints of phantom pains involving the right lower extremity and the sensation that he she is still having excruciating pain in her right foot.  She actually feels that the discomfort/pain is intensifying with time not going away.  At the present time she is now taking 1600 mg total of gabapentin per day.  She is also taking Cymbalta.  She does note that she has been through physical therapy and has done some mirror therapy as well.  No outpatient medications have been marked as taking for the 10/13/21 encounter (Appointment) with Delana Meyer, Dolores Lory, MD.    Past Medical History:  Diagnosis Date   Arthritis    right shoulder   Depression    Dyspnea    GERD (gastroesophageal reflux disease)    History of blood clots    Hyperlipidemia    Hypertension    Mild mitral regurgitation    Osteoporosis    Peripheral vascular disease ([place])    Stomach ulcer    Vascular disease    Sees Dr. Delana Meyer   Vertigo    Last episode approx Aug 2015   Wears dentures    full upper    Past Surgical History:  Procedure Laterality Date   ADENOIDECTOMY     AMPUTATION Right 08/11/2020   Procedure: AMPUTATION ABOVE KNEE;  Surgeon: [name], MD;  Location: [facility];  Service: Vascular;  Laterality: Right;   ARTERY BIOPSY Right 07/14/2019   Procedure: BIOPSY TEMPORAL ARTERY;  Surgeon: [name], MD;  Location: [facility];  Service: Vascular;  Laterality: Right;   BREAST CYST ASPIRATION Left    CARDIAC CATHETERIZATION  02/15/2017   [facility]   COLONOSCOPY WITH PROPOFOL N/A 10/22/2017   Procedure: COLONOSCOPY WITH PROPOFOL;  Surgeon: Lucilla Lame, MD;  Location: [place];  Service: Endoscopy;  Laterality: N/A;  specimens not taken--pt on Plavix will be brought back in after 7 days  off med   COLONOSCOPY WITH PROPOFOL N/A 11/05/2017   Procedure: COLONOSCOPY WITH PROPOFOL;  Surgeon: Lucilla Lame, MD;  Location: [place];  Service: Endoscopy;  Laterality: N/A;   ESOPHAGOGASTRODUODENOSCOPY N/A 12/21/2014   Procedure: ESOPHAGOGASTRODUODENOSCOPY (EGD);  Surgeon: Lucilla Lame, MD;  Location: [place];  Service: Gastroenterology;  Laterality: N/A;   ESOPHAGOGASTRODUODENOSCOPY (EGD) WITH PROPOFOL N/A 02/08/2016   Procedure: ESOPHAGOGASTRODUODENOSCOPY (EGD) WITH PROPOFOL;  Surgeon: Lucilla Lame, MD;  Location: [facility];  Service: Endoscopy;  Laterality: N/A;   FASCIOTOMY Right 05/30/2019   Procedure: FASCIOTOMY;  Surgeon: [name], MD;  Location: [facility];  Service: Vascular;  Laterality: Right;   FASCIOTOMY CLOSURE Right 06/04/2019   Procedure: FASCIOTOMY CLOSURE;  Surgeon: [name], MD;  Location: [facility];  Service: Vascular;  Laterality: Right;   HEMORROIDECTOMY  2014   LOWER EXTREMITY ANGIOGRAPHY Right 05/30/2019   Procedure: LOWER EXTREMITY ANGIOGRAPHY;  Surgeon: [name], MD;  Location: [place] [facility];  Service: Cardiovascular;  Laterality: Right;   LOWER EXTREMITY ANGIOGRAPHY Right 07/02/2020   Procedure: LOWER EXTREMITY ANGIOGRAPHY;  Surgeon: [name], MD;  Location: [place] [facility];  Service: Cardiovascular;  Laterality: Right;   PERIPHERAL VASCULAR CATHETERIZATION N/A 02/09/2016   Procedure: Abdominal Aortogram w/Lower Extremity;  Surgeon: [name], MD;  Location: [place] [facility];  Service:  Cardiovascular;  Laterality: N/A;   PERIPHERAL VASCULAR CATHETERIZATION Right 02/10/2016   Procedure: Lower Extremity Angiography;  Surgeon: [name], MD;  Location: [place] [facility];  Service: Cardiovascular;  Laterality: Right;   POLYPECTOMY  11/05/2017   Procedure: POLYPECTOMY;  Surgeon: Lucilla Lame, MD;  Location: [place];  Service: Endoscopy;;   SHOULDER ARTHROSCOPY WITH  ROTATOR CUFF REPAIR AND SUBACROMIAL DECOMPRESSION Right 02/27/2020   Procedure: RIGHT SHOULDER ARTHROSCOPY SUBACROMIAL DECOMPRESSION, DISTAL CLAVICLE EXCISION AND MINI-OPEN ROTATOR CUFF REPAIR;  Surgeon: Thornton Park, MD;  Location: [facility];  Service: Orthopedics;  Laterality: Right;   TONSILLECTOMY     VASCULAR SURGERY  6073,7106   Fem-Pop Bypass    Social History Social History   Tobacco Use   Smoking status: Former    Packs/day: 2.00    Years: 25.00    Pack years: 50.00    Types: Cigarettes    Quit date: 08/22/1991    Years since quitting: 30.1   Smokeless tobacco: Never  Vaping Use   Vaping Use: Never used  Substance Use Topics   Alcohol use: No    Alcohol/week: 0.0 standard drinks   Drug use: Never    Family History Family History  Problem Relation Age of Onset   CVA Mother    Heart attack Mother    Cancer Father        colon cancer   Colon cancer Father    Heart disease Maternal Uncle    Breast cancer Neg Hx     Allergies  Allergen Reactions   Hydrocodone-Acetaminophen     Other reaction(s): Flushing Other reaction(s): Flushing   Amoxicillin Other (See Comments)    Yeast infection   Metoprolol Tartrate Rash   Oxycodone Itching   Vicodin [Hydrocodone-Acetaminophen] Hives and Rash    Flushing     REVIEW OF SYSTEMS (Negative unless checked)  Constitutional: [] Weight loss  [] Fever  [] Chills Cardiac: [] Chest pain   [] Chest pressure   [] Palpitations   [] Shortness of breath when laying flat   [] Shortness of breath with exertion. Vascular:  [] Pain in legs with walking   [] Pain in legs at rest  [] History of DVT   [] Phlebitis   [] Swelling in legs   [] Varicose veins   [] Non-healing ulcers Pulmonary:   [] Uses home oxygen   [] Productive cough   [] Hemoptysis   [] Wheeze  [] COPD   [] Asthma Neurologic:  [] Dizziness   [] Seizures   [] History of stroke   [] History of TIA  [] Aphasia   [] Vissual changes   [] Weakness or numbness in arm   [] Weakness or numbness in  leg Musculoskeletal:   [] Joint swelling   [] Joint pain   [] Low back pain Hematologic:  [] Easy bruising  [] Easy bleeding   [] Hypercoagulable state   [] Anemic Gastrointestinal:  [] Diarrhea   [] Vomiting  [] Gastroesophageal reflux/heartburn   [] Difficulty swallowing. Genitourinary:  [] Chronic kidney disease   [] Difficult urination  [] Frequent urination   [] Blood in urine Skin:  [] Rashes   [] Ulcers  Psychological:  [] History of anxiety   []  History of major depression.  Physical Examination  There were no vitals filed for this visit. There is no height or weight on file to calculate BMI. Gen: WD/WN, NAD Head: [place]/AT, No temporalis wasting.  Ear/Nose/Throat: Hearing grossly intact, nares w/o erythema or drainage Eyes: PER, EOMI, sclera nonicteric.  Neck: Supple, no masses.  No bruit or JVD.  Pulmonary:  Good air movement, no audible wheezing, no use of accessory muscles.  Cardiac: RRR, normal S1, S2, no Murmurs.  Vascular:   Vessel Right Left  Radial Palpable Palpable  PT AKA Palpable  DP AKA Palpable  Gastrointestinal: soft, non-distended. No guarding/no peritoneal signs.  Musculoskeletal: M/S 5/5 throughout.  No visible deformity.  Neurologic: CN 2-12 intact. Pain and light touch intact in extremities.  Symmetrical.  Speech is fluent. Motor exam as listed above. Psychiatric: Judgment intact, Mood & affect appropriate for pt's clinical situation. Dermatologic: No rashes or ulcers noted.  No changes consistent with cellulitis.   CBC Lab Results  Component Value Date   WBC 5.3 06/24/2021   HGB 13.7 06/24/2021   HCT 42.1 06/24/2021   MCV 97.5 06/24/2021   PLT 163 06/24/2021    BMET    Component Value Date/Time   NA 136 06/24/2021 1139   NA 139 09/21/2015 0916   K 3.8 06/24/2021 1139   CL 102 06/24/2021 1139   CO2 26 06/24/2021 1139   GLUCOSE 105 (H) 06/24/2021 1139   BUN 15 06/24/2021 1139   BUN 12 09/21/2015 0916   CREATININE 0.80 06/24/2021 1139   CREATININE 0.88  05/16/2016 1034   CALCIUM 8.7 (L) 06/24/2021 1139   GFRNONAA >60 06/24/2021 1139   GFRNONAA 66 05/01/2016 0846   GFRAA >60 02/18/2020 0949   GFRAA 76 05/01/2016 0846   CrCl cannot be calculated (Patient's most recent lab result is older than the maximum 21 days allowed.).  COAG Lab Results  Component Value Date   INR 1.0 08/09/2020   INR 1.3 (H) 02/18/2020   INR 1.0 07/11/2019    Radiology No results found.   Assessment/Plan 1. S/P AKA (above knee amputation), right (HCC) In my experience it is somewhat unusual for phantom pains to intensify with time rather than dissipate.  In review she is already on a significant dose of gabapentin therefore I do not think increasing the dose is appropriate at this time.  Adding an antidepressant has already been done.  I would like to investigate whether pain management might have something to offer, whether they have any insight into available resources to treat phantom pain.  Physical therapy was reengaged but has not helped.  I have asked that she return to see me in 3 month to see if any of these ideas have been helpful.  - Ambulatory referral to Pain Clinic  2. Peripheral arterial occlusive disease ([place]) In my experience it is somewhat unusual for phantom pains to intensify with time rather than dissipate.  In review she is already on a significant dose of gabapentin therefore I do not think increasing the dose is appropriate at this time.  Adding an antidepressant has already been done.  I would like to investigate whether pain management might have something to offer, whether they have any insight into available resources to treat phantom pain.  Physical therapy was reengaged but has not helped.  I have asked that she return to see me in 3 month to see if any of these ideas have been helpful. - apixaban (ELIQUIS) 5 MG TABS tablet; Take 1 tablet (5 mg total) by mouth 2 (two) times daily.  Dispense: 60 tablet; Refill: 11  3. Stenosis of right  carotid artery Recommend:   Given the patient's asymptomatic subcritical stenosis no further invasive testing or surgery at this time.    Continue antiplatelet therapy as prescribed Continue management of CAD, HTN and Hyperlipidemia Healthy heart diet,  encouraged exercise at least 4 times per week.   Follow up in 12 months with duplex ultrasound and physical exam  4. Essential hypertension Continue antihypertensive medications as already ordered, these medications have been reviewed and there are no changes at this time.   5. Coronary artery disease involving native coronary artery of native heart with angina pectoris ([place]) Continue cardiac and antihypertensive medications as already ordered and reviewed, no changes at this time.  Continue statin as ordered and reviewed, no changes at this time  Nitrates PRN for chest pain     [name], MD  10/13/2021 1:10 PM

## 2021-10-16 ENCOUNTER — Encounter (INDEPENDENT_AMBULATORY_CARE_PROVIDER_SITE_OTHER): Payer: Self-pay | Admitting: Vascular Surgery

## 2021-10-16 MED ORDER — APIXABAN 5 MG PO TABS: 5.0000 mg | ORAL_TABLET | Freq: Two times a day (BID) | ORAL | 11 refills | Status: DC

## 2021-10-17 ENCOUNTER — Ambulatory Visit: Payer: Medicare Other

## 2021-10-17 ENCOUNTER — Other Ambulatory Visit: Payer: Self-pay

## 2021-10-17 DIAGNOSIS — M6281 Muscle weakness (generalized): Secondary | ICD-10-CM

## 2021-10-17 DIAGNOSIS — G546 Phantom limb syndrome with pain: Secondary | ICD-10-CM | POA: Diagnosis not present

## 2021-10-17 DIAGNOSIS — R262 Difficulty in walking, not elsewhere classified: Secondary | ICD-10-CM | POA: Diagnosis not present

## 2021-10-17 DIAGNOSIS — R2681 Unsteadiness on feet: Secondary | ICD-10-CM | POA: Diagnosis not present

## 2021-10-17 DIAGNOSIS — M79604 Pain in right leg: Secondary | ICD-10-CM | POA: Diagnosis not present

## 2021-10-17 NOTE — Therapy (Addendum)
[facility] [address] [place], [place], [address] Phone: [phone]   Fax:  [phone]  Physical Therapy Treatment   Patient Details  Name: Sara [name] MRN: [id number] Date of Birth: 1951/03/16 Referring Provider (PT): Dr. Delana Meyer   Encounter Date: 10/17/2021   PT End of Session - 10/17/21 1045     Visit Number 11    Number of Visits 25    Date for PT Re-Evaluation 11/28/21    Authorization Type eval: 09/05/21    PT Start Time 33    PT Stop Time 1103    PT Time Calculation (min) 44 min    Equipment Utilized During Treatment Gait belt    Activity Tolerance Patient tolerated treatment well    Behavior During Therapy [facility] for tasks assessed/performed                Past Medical History:  Diagnosis Date   Arthritis    right shoulder   Depression    Dyspnea    GERD (gastroesophageal reflux disease)    History of blood clots    Hyperlipidemia    Hypertension    Mild mitral regurgitation    Osteoporosis    Peripheral vascular disease ([place])    Stomach ulcer    Vascular disease    Sees Dr. Delana Meyer   Vertigo    Last episode approx Aug 2015   Wears dentures    full upper    Past Surgical History:  Procedure Laterality Date   ADENOIDECTOMY     AMPUTATION Right 08/11/2020   Procedure: AMPUTATION ABOVE KNEE;  Surgeon: [name], MD;  Location: [facility];  Service: Vascular;  Laterality: Right;   ARTERY BIOPSY Right 07/14/2019   Procedure: BIOPSY TEMPORAL ARTERY;  Surgeon: [name], MD;  Location: [facility];  Service: Vascular;  Laterality: Right;   BREAST CYST ASPIRATION Left    CARDIAC CATHETERIZATION  02/15/2017   [facility]   COLONOSCOPY WITH PROPOFOL N/A 10/22/2017   Procedure: COLONOSCOPY WITH PROPOFOL;  Surgeon: Lucilla Lame, MD;  Location: [place];  Service: Endoscopy;  Laterality: N/A;  specimens not taken--pt on Plavix will be brought back in after 7 days off med   COLONOSCOPY  WITH PROPOFOL N/A 11/05/2017   Procedure: COLONOSCOPY WITH PROPOFOL;  Surgeon: Lucilla Lame, MD;  Location: [place];  Service: Endoscopy;  Laterality: N/A;   ESOPHAGOGASTRODUODENOSCOPY N/A 12/21/2014   Procedure: ESOPHAGOGASTRODUODENOSCOPY (EGD);  Surgeon: Lucilla Lame, MD;  Location: [place];  Service: Gastroenterology;  Laterality: N/A;   ESOPHAGOGASTRODUODENOSCOPY (EGD) WITH PROPOFOL N/A 02/08/2016   Procedure: ESOPHAGOGASTRODUODENOSCOPY (EGD) WITH PROPOFOL;  Surgeon: Lucilla Lame, MD;  Location: [facility];  Service: Endoscopy;  Laterality: N/A;   FASCIOTOMY Right 05/30/2019   Procedure: FASCIOTOMY;  Surgeon: [name], MD;  Location: [facility];  Service: Vascular;  Laterality: Right;   FASCIOTOMY CLOSURE Right 06/04/2019   Procedure: FASCIOTOMY CLOSURE;  Surgeon: [name], MD;  Location: [facility];  Service: Vascular;  Laterality: Right;   HEMORROIDECTOMY  2014   LOWER EXTREMITY ANGIOGRAPHY Right 05/30/2019   Procedure: LOWER EXTREMITY ANGIOGRAPHY;  Surgeon: [name], MD;  Location: [place] [facility];  Service: Cardiovascular;  Laterality: Right;   LOWER EXTREMITY ANGIOGRAPHY Right 07/02/2020   Procedure: LOWER EXTREMITY ANGIOGRAPHY;  Surgeon: [name], MD;  Location: [place] [facility];  Service: Cardiovascular;  Laterality: Right;   PERIPHERAL VASCULAR CATHETERIZATION N/A 02/09/2016   Procedure: Abdominal Aortogram w/Lower  Extremity;  Surgeon: [name], MD;  Location: [place] [facility];  Service: Cardiovascular;  Laterality: N/A;   PERIPHERAL VASCULAR CATHETERIZATION Right 02/10/2016   Procedure: Lower Extremity Angiography;  Surgeon: [name], MD;  Location: [place] [facility];  Service: Cardiovascular;  Laterality: Right;   POLYPECTOMY  11/05/2017   Procedure: POLYPECTOMY;  Surgeon: Lucilla Lame, MD;  Location: [place];  Service: Endoscopy;;   SHOULDER ARTHROSCOPY WITH ROTATOR CUFF REPAIR AND  SUBACROMIAL DECOMPRESSION Right 02/27/2020   Procedure: RIGHT SHOULDER ARTHROSCOPY SUBACROMIAL DECOMPRESSION, DISTAL CLAVICLE EXCISION AND MINI-OPEN ROTATOR CUFF REPAIR;  Surgeon: Thornton Park, MD;  Location: [facility];  Service: Orthopedics;  Laterality: Right;   TONSILLECTOMY     VASCULAR SURGERY  [id number],7989   Fem-Pop Bypass    There were no vitals filed for this visit.   Subjective Assessment - 10/17/21 1035     Subjective Pt states the phantom pain is the worst it has been in awhile today. Pt states phantom pain is a 8/10 upon arrival. Pt's shoulder pain continued to bother her over the weekend and into this morning.    Pertinent History Pt reports slight worsening RLE phantom pain since ending therapy. She is taking gabapentin which helps with the pain however it is still very uncomfortable. Pain is continuous and is worse after extended walking with her prosthetic. She has not been performing her HEP after last discharge. She will get a suction fit socket for her RLE prosthesis this Friday.   History from 01/26/21: Pt has a history of RLE vascular issues since 1991. 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 She received her RLE prosthetic in April 2022 and has been receiving [facility] since her surgery. They discharged her yesterday to continue with OP PT. She continues with phantom RLE pain, especially at night and takes gabapentin with minimal relief. She has also been having R shoulder pain since the surgery which she attributes to using a wheelchair. She underwent R RTC surgery in September of 2021 and had physical therapy afterwards. She has been having issues with bilateral carpal tunnel and has a NCV study scheduled for tomorrow.   History from 01/26/21: Pt has a history of RLE vascular issues since 1991. 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 She received her RLE prosthetic in April 2022 and has been receiving [facility]  since her surgery. They discharged her yesterday to continue with OP PT. She continues with phantom RLE pain, especially at night and takes gabapentin with minimal relief. She has also been having R shoulder pain since the surgery which she attributes to using a wheelchair. She underwent R RTC surgery in September of 2021 and had physical therapy afterwards. She has been having issues with bilateral carpal tunnel and has a NCV study scheduled for tomorrow.    Limitations Walking    Diagnostic tests see history    Patient Stated Goals Ambulate with her prosthetic                  TREATMENT   Ther-ex With prosthetic: [facility] weaving through hallway x 3 lengths Walking in hallway x 2 lengths Stairs x 4 // bars: marching 2 x 20 BLE // bars: lateral walking x 4 lengths each direction Nu-step x 4 intervals of L4 x 45s on L0 x 30mn off, pre vitals: Pulse 93 BP 113/65   Pt educated throughout session about proper posture and  technique with exercises. Improved exercise technique, movement at target joints, use of target muscles after min to mod verbal, visual, tactile cues.   Focused session on functional exercises with the prosthesis on in order to start building up endurance. Pt needed frequent breaks due combination of phantom limb pain, shoulder pain, and fatigue. Pt reported difficulty with standing marching within the // bars. Pt would benefit from continued PT services to address deficits in endurance, strength, and balance in order to return to full function at home and reduce risk of falls.     PT Short Term Goals - 09/05/21 1215       PT SHORT TERM GOAL #1   Title Pt will be independent with HEP in order to improve strength and balance as well as decrase pain in order to decrease fall risk and improve pain-free function at home.    Time 6    Period Weeks    Status New    Target Date 10/17/21               PT Long Term Goals - 10/12/21 1101       PT LONG TERM GOAL  #1   Title Pt will decrease worst RLE phantom pain by at least 3 points on the NPRS in order to improve pain-free function at home    Baseline 09/05/21: Worst: 7/10    Time 12    Period Weeks    Status Partially Met    Target Date 11/28/21      PT LONG TERM GOAL #2   Title Pt will improve ABC by at least 13% in order to demonstrate clinically significant improvement in balance confidence.    Baseline 09/05/21: To be completed 10/12/21: 92.7%    Time 12    Period Weeks    Status Partially Met    Target Date 11/28/21      PT LONG TERM GOAL #3   Title Pt will improve FOTO to at least 59 in order to demonstrate clinically significant improvement in function related to her RLE amputation    Baseline 09/05/21: 53 10/12/21: 58    Time 12    Period Weeks    Status Partially Met    Target Date 11/28/21      PT LONG TERM GOAL #4   Title Pt will be able to walk for 10 minutes while keeping RPE below 4 without needing to sit for purposes of building cardiovascular endurance and being able to functional ambulate throughout the community for tasks such as shopping    Time 4    Period Weeks    Status On-going    Target Date 11/09/21                   Plan - 10/17/21 1157     Clinical Impression Statement Focused session on functional exercises with the prosthesis on in order to start building up endurance. Pt needed frequent breaks due combination of phantom limb pain, shoulder pain, and fatigue. Pt reported difficulty with standing marching within the // bars. Pt would benefit from continued PT services to address deficits in endurance, strength, and balance in order to return to full function at home and reduce risk of falls.    Personal Factors and Comorbidities Age;Comorbidity 3+    Comorbidities Depression, osteoporosis, vascular disease    Examination-Activity Limitations Bathing;Locomotion Level;Squat;Stairs;Stand;Transfers    Examination-Participation Restrictions  Cleaning;Community Activity;Meal Prep;Yard Work    [employer] Unstable/Unpredictable    Rehab  Potential Good    PT Frequency 2x / week    PT Duration 12 weeks    PT Treatment/Interventions ADLs/Self Care Home Management;Aquatic Therapy;Canalith Repostioning;Cryotherapy;Electrical Stimulation;Iontophoresis 36m/ml Dexamethasone;Moist Heat;Traction;Ultrasound;Gait training;Stair training;Therapeutic activities;Therapeutic exercise;Neuromuscular re-education;Manual techniques;Dry needling;Vestibular;Spinal Manipulations;Joint Manipulations;Balance training;Patient/family education;Cognitive remediation;Passive range of motion;Visual/perceptual remediation/compensation    PT Next Visit Plan Update outcome measures/goals, progress note, mirror therapy, desensitization techniques (STM, superficial stimulation)    PT Home Exercise Plan Last episode access code: [email or web address]    Consulted and Agree with Plan of Care Patient                Patient will benefit from skilled therapeutic intervention in order to improve the following deficits and impairments:  Abnormal gait, Decreased strength, Difficulty walking, Pain  Visit Diagnosis: Muscle weakness (generalized)  Difficulty in walking, not elsewhere classified     Problem List Patient Active Problem List   Diagnosis Date Noted   Muscle spasm 09/09/2020   S/P AKA (above knee amputation), right ([facility] 09/08/2020   Ischemia of extremity 07/02/2020   Chronic pain in right shoulder 11/25/2019   Encounter for long-term (current) use of high-risk medication 07/16/2019   GCA (giant cell arteritis) ([facility] 07/16/2019   Temporal arteritis ([facility] 07/13/2019   Postoperative wound infection 06/23/2019   Ischemic leg 05/31/2019   Atherosclerosis of native arteries of extremity with intermittent claudication ([facility] 05/30/2019   Depression 05/29/2019   Eczema of lower extremity 03/04/2018   Chronic venous insufficiency 03/02/2018    Personal history of colonic polyps    Family history of colonic polyps    Benign neoplasm of ascending colon    Mitral valve insufficiency 02/08/2017   Dyspnea on exertion 02/07/2017   Non-rheumatic mitral regurgitation 02/07/2017   Precordial pain 02/07/2017   CAD (coronary artery disease) 12/21/2016   Essential hypertension 12/21/2016   Compression fracture of lumbar spine, non-traumatic ([place] 04/26/2016   Compression fracture of L3 lumbar vertebra 04/20/2016   PVD (peripheral vascular disease) ([facility] 02/24/2016   Family history of premature CAD 02/24/2016   Gastritis    Other specified diseases of esophagus    Hiatal hernia    Gastritis and gastroduodenitis    Ischemia of lower extremity    Arterial occlusion (HCC)    Atherosclerosis of aorta ([place]    History of smoking    Hyperlipidemia    Pain in the chest    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 02/05/2016   Carotid artery stenosis 01/04/2016   Vertigo, benign paroxysmal 12/28/2015   Clinical depression 09/06/2015   History of alcoholism ([place] 09/06/2015   Angiopathy, 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[facility] 09/06/2015   Atherosclerosis of native arteries of extremity with rest pain ([facility] 09/06/2015   Acute non-recurrent maxillary sinusitis 07/14/2015   Vaginal pruritus 05/26/2015   Chronic recurrent major depressive disorder ([facility] 03/09/2015   Osteoporosis, post-menopausal 03/09/2015   Peripheral blood vessel disorder ([facility] [facility] 03/09/2015   Acid reflux 03/09/2015   GERD (gastroesophageal reflux disease) 12/21/2014   Carotid artery narrowing 01/28/2014   Peripheral arterial occlusive disease ([facility] 06/08/2011   Occlusion and stenosis of unspecified carotid artery 06/08/2011   PAD (peripheral artery disease) ([facility] 06/08/2011    JPhillips GroutPT, DPT, GCS  Josephus Harriger SPT 10/18/2021, 9:15 AM  [facility] [facility] [place] [address]Phone: [phone]   Fax:  [phone] Name: Sara BOHANMRN: [id number] of Birth: 207-Feb-1952

## 2021-10-18 ENCOUNTER — Telehealth (INDEPENDENT_AMBULATORY_CARE_PROVIDER_SITE_OTHER): Payer: Self-pay

## 2021-10-18 NOTE — Telephone Encounter (Signed)
Patient left a voicemail stating that her income was over the income limit of $43000 for [facility]. Patient was informed that she can get her medication from [place] or might need to be prescribe different blood thinner. I spoke with Arna Medici NP and she recommended that the patient might have to come out of pocket, base on her history Plavix would likely not be the best option,patient would have to switch to coumadin if eliquis is not affordable,and the patient can check on the information for receiving medication from [place]. The patient was made aware and will reach out next week on her decision.

## 2021-10-19 ENCOUNTER — Other Ambulatory Visit: Payer: Self-pay

## 2021-10-19 ENCOUNTER — Ambulatory Visit: Payer: Medicare Other | Attending: Vascular Surgery

## 2021-10-19 DIAGNOSIS — M6281 Muscle weakness (generalized): Secondary | ICD-10-CM | POA: Diagnosis not present

## 2021-10-19 DIAGNOSIS — R262 Difficulty in walking, not elsewhere classified: Secondary | ICD-10-CM | POA: Insufficient documentation

## 2021-10-19 DIAGNOSIS — M79604 Pain in right leg: Secondary | ICD-10-CM | POA: Diagnosis not present

## 2021-10-19 DIAGNOSIS — G546 Phantom limb syndrome with pain: Secondary | ICD-10-CM | POA: Insufficient documentation

## 2021-10-19 NOTE — Therapy (Addendum)
[facility] [address] [place], [place], [address] Phone: [phone]   Fax:  [phone]  Physical Therapy Treatment   Patient Details  Name: Sara [name] MRN: [id number] Date of Birth: 1951/05/19 Referring Provider (PT): Dr. Delana Meyer   Encounter Date: 10/19/2021   PT End of Session - 10/19/21 1017     Visit Number 12    Number of Visits 25    Date for PT Re-Evaluation 11/28/21    Authorization Type eval: 09/05/21    PT Start Time 1016    PT Stop Time 1100    PT Time Calculation (min) 44 min    Equipment Utilized During Treatment Gait belt    Activity Tolerance Patient tolerated treatment well    Behavior During Therapy [facility] for tasks assessed/performed                Past Medical History:  Diagnosis Date   Arthritis    right shoulder   Depression    Dyspnea    GERD (gastroesophageal reflux disease)    History of blood clots    Hyperlipidemia    Hypertension    Mild mitral regurgitation    Osteoporosis    Peripheral vascular disease ([place])    Stomach ulcer    Vascular disease    Sees Dr. Delana Meyer   Vertigo    Last episode approx Aug 2015   Wears dentures    full upper    Past Surgical History:  Procedure Laterality Date   ADENOIDECTOMY     AMPUTATION Right 08/11/2020   Procedure: AMPUTATION ABOVE KNEE;  Surgeon: [name], MD;  Location: [facility];  Service: Vascular;  Laterality: Right;   ARTERY BIOPSY Right 07/14/2019   Procedure: BIOPSY TEMPORAL ARTERY;  Surgeon: [name], MD;  Location: [facility];  Service: Vascular;  Laterality: Right;   BREAST CYST ASPIRATION Left    CARDIAC CATHETERIZATION  02/15/2017   [facility]   COLONOSCOPY WITH PROPOFOL N/A 10/22/2017   Procedure: COLONOSCOPY WITH PROPOFOL;  Surgeon: Lucilla Lame, MD;  Location: [place];  Service: Endoscopy;  Laterality: N/A;  specimens not taken--pt on Plavix will be brought back in after 7 days off med   COLONOSCOPY  WITH PROPOFOL N/A 11/05/2017   Procedure: COLONOSCOPY WITH PROPOFOL;  Surgeon: Lucilla Lame, MD;  Location: [place];  Service: Endoscopy;  Laterality: N/A;   ESOPHAGOGASTRODUODENOSCOPY N/A 12/21/2014   Procedure: ESOPHAGOGASTRODUODENOSCOPY (EGD);  Surgeon: Lucilla Lame, MD;  Location: [place];  Service: Gastroenterology;  Laterality: N/A;   ESOPHAGOGASTRODUODENOSCOPY (EGD) WITH PROPOFOL N/A 02/08/2016   Procedure: ESOPHAGOGASTRODUODENOSCOPY (EGD) WITH PROPOFOL;  Surgeon: Lucilla Lame, MD;  Location: [facility];  Service: Endoscopy;  Laterality: N/A;   FASCIOTOMY Right 05/30/2019   Procedure: FASCIOTOMY;  Surgeon: [name], MD;  Location: [facility];  Service: Vascular;  Laterality: Right;   FASCIOTOMY CLOSURE Right 06/04/2019   Procedure: FASCIOTOMY CLOSURE;  Surgeon: [name], MD;  Location: [facility];  Service: Vascular;  Laterality: Right;   HEMORROIDECTOMY  2014   LOWER EXTREMITY ANGIOGRAPHY Right 05/30/2019   Procedure: LOWER EXTREMITY ANGIOGRAPHY;  Surgeon: [name], MD;  Location: [place] [facility];  Service: Cardiovascular;  Laterality: Right;   LOWER EXTREMITY ANGIOGRAPHY Right 07/02/2020   Procedure: LOWER EXTREMITY ANGIOGRAPHY;  Surgeon: [name], MD;  Location: [place] [facility];  Service: Cardiovascular;  Laterality: Right;   PERIPHERAL VASCULAR CATHETERIZATION N/A 02/09/2016   Procedure: Abdominal Aortogram w/Lower  Extremity;  Surgeon: [name], MD;  Location: [place] [facility];  Service: Cardiovascular;  Laterality: N/A;   PERIPHERAL VASCULAR CATHETERIZATION Right 02/10/2016   Procedure: Lower Extremity Angiography;  Surgeon: [name], MD;  Location: [place] [facility];  Service: Cardiovascular;  Laterality: Right;   POLYPECTOMY  11/05/2017   Procedure: POLYPECTOMY;  Surgeon: Lucilla Lame, MD;  Location: [place];  Service: Endoscopy;;   SHOULDER ARTHROSCOPY WITH ROTATOR CUFF REPAIR AND  SUBACROMIAL DECOMPRESSION Right 02/27/2020   Procedure: RIGHT SHOULDER ARTHROSCOPY SUBACROMIAL DECOMPRESSION, DISTAL CLAVICLE EXCISION AND MINI-OPEN ROTATOR CUFF REPAIR;  Surgeon: Thornton Park, MD;  Location: [facility];  Service: Orthopedics;  Laterality: Right;   TONSILLECTOMY     VASCULAR SURGERY  3235,5732   Fem-Pop Bypass    There were no vitals filed for this visit.   Subjective Assessment - 10/19/21 1024     Subjective Pt states her phantom pain has been better the last two days and has only been bothering her at night. Pt reports her shoulder is in a lot of pain today.    Pertinent History Pt reports slight worsening RLE phantom pain since ending therapy. She is taking gabapentin which helps with the pain however it is still very uncomfortable. Pain is continuous and is worse after extended walking with her prosthetic. She has not been performing her HEP after last discharge. She will get a suction fit socket for her RLE prosthesis this Friday.   History from 01/26/21: Pt has a history of RLE vascular issues since 1991. 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 She received her RLE prosthetic in April 2022 and has been receiving [place] PT since her surgery. They discharged her yesterday to continue with OP PT. She continues with phantom RLE pain, especially at night and takes gabapentin with minimal relief. She has also been having R shoulder pain since the surgery which she attributes to using a wheelchair. She underwent R RTC surgery in September of 2021 and had physical therapy afterwards. She has been having issues with bilateral carpal tunnel and has a NCV study scheduled for tomorrow.   History from 01/26/21: Pt has a history of RLE vascular issues since 1991. 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 She received her RLE prosthetic in April 2022 and has been receiving [place] PT since her surgery. They discharged her  yesterday to continue with OP PT. She continues with phantom RLE pain, especially at night and takes gabapentin with minimal relief. She has also been having R shoulder pain since the surgery which she attributes to using a wheelchair. She underwent R RTC surgery in September of 2021 and had physical therapy afterwards. She has been having issues with bilateral carpal tunnel and has a NCV study scheduled for tomorrow.    Limitations Walking    Diagnostic tests see history    Patient Stated Goals Ambulate with her prosthetic                  TREATMENT   Ther-ex With prosthetic: Walking in hallway x 3 lengths // bars: walking over 3" step x 4 lengths  // bars: lateral walking x 4 lengths each direction // bars: marching 2 x 10 BLE Outside: Walking over uneven surfaces and changes in surfaces; sidewalk to grass Nu-step x 4 intervals of L3 x 45s on L0 x 60mn off, pre vitals: Pulse 95 BP 133/67   Pt educated  throughout session about proper posture and technique with exercises. Improved exercise technique, movement at target joints, use of target muscles after min to mod verbal, visual, tactile cues.   Pt demonstrated excellent motivation in session today. Focused session on standing functional strengthening and endurance with the prosthesis on. Had to take frequent breaks due to general muscle fatigue and flare up of shoulder pain. Pt demonstrated some hesitation with stepping over objects within the // bars. Pt would benefit from continued PT services to address deficits in endurance, strength and balance in order to return to full function at home and reduce future risk of falls.      PT Short Term Goals - 09/05/21 1215       PT SHORT TERM GOAL #1   Title Pt will be independent with HEP in order to improve strength and balance as well as decrase pain in order to decrease fall risk and improve pain-free function at home.    Time 6    Period Weeks    Status New    Target Date  10/17/21               PT Long Term Goals - 10/12/21 1101       PT LONG TERM GOAL #1   Title Pt will decrease worst RLE phantom pain by at least 3 points on the NPRS in order to improve pain-free function at home    Baseline 09/05/21: Worst: 7/10    Time 12    Period Weeks    Status Partially Met    Target Date 11/28/21      PT LONG TERM GOAL #2   Title Pt will improve ABC by at least 13% in order to demonstrate clinically significant improvement in balance confidence.    Baseline 09/05/21: To be completed 10/12/21: 92.7%    Time 12    Period Weeks    Status Partially Met    Target Date 11/28/21      PT LONG TERM GOAL #3   Title Pt will improve FOTO to at least 59 in order to demonstrate clinically significant improvement in function related to her RLE amputation    Baseline 09/05/21: 53 10/12/21: 58    Time 12    Period Weeks    Status Partially Met    Target Date 11/28/21      PT LONG TERM GOAL #4   Title Pt will be able to walk for 10 minutes while keeping RPE below 4 without needing to sit for purposes of building cardiovascular endurance and being able to functional ambulate throughout the community for tasks such as shopping    Time 4    Period Weeks    Status On-going    Target Date 11/09/21                   Plan - 10/19/21 1310     Clinical Impression Statement Pt demonstrated excellent motivation in session today. Focused session on standing functional strengthening and endurance with the prosthesis on. Had to take frequent breaks due to general muscle fatigue and flare up of shoulder pain. Pt demonstrated some hesitation with stepping over objects within the // bars. Pt would benefit from continued PT services to address deficits in endurance, strength and balance in order to return to full function at home and reduce future risk of falls.    Personal Factors and Comorbidities Age;Comorbidity 3+    Comorbidities Depression, osteoporosis, vascular  disease    Examination-Activity  Limitations Bathing;Locomotion Level;Squat;Stairs;Stand;Transfers    Examination-Participation Restrictions Cleaning;Community Activity;Meal Prep;Yard Work    Stability/Clinical Decision Making Unstable/Unpredictable    Rehab Potential Good    PT Frequency 2x / week    PT Duration 12 weeks    PT Treatment/Interventions ADLs/Self Care Home Management;Aquatic Therapy;Canalith Repostioning;Cryotherapy;Electrical Stimulation;Iontophoresis 68m/ml Dexamethasone;Moist Heat;Traction;Ultrasound;Gait training;Stair training;Therapeutic activities;Therapeutic exercise;Neuromuscular re-education;Manual techniques;Dry needling;Vestibular;Spinal Manipulations;Joint Manipulations;Balance training;Patient/family education;Cognitive remediation;Passive range of motion;Visual/perceptual remediation/compensation    PT Next Visit Plan Update outcome measures/goals, progress note, mirror therapy, desensitization techniques (STM, superficial stimulation)    PT Home Exercise Plan Last episode access code: [email or web address]    Consulted and Agree with Plan of Care Patient                 Patient will benefit from skilled therapeutic intervention in order to improve the following deficits and impairments:  Abnormal gait, Decreased strength, Difficulty walking, Pain  Visit Diagnosis: Muscle weakness (generalized)  Difficulty in walking, not elsewhere classified  Phantom limb syndrome with pain ([facility]     Problem List Patient Active Problem List   Diagnosis Date Noted   Muscle spasm 09/09/2020   S/P AKA (above knee amputation), right ([facility] 09/08/2020   Ischemia of extremity 07/02/2020   Chronic pain in right shoulder 11/25/2019   Encounter for long-term (current) use of high-risk medication 07/16/2019   GCA (giant cell arteritis) ([place] 07/16/2019   Temporal arteritis ([facility] 07/13/2019   Postoperative wound infection 06/23/2019   Ischemic leg 05/31/2019   Atherosclerosis of  native arteries of extremity with intermittent claudication ([facility] 05/30/2019   Depression 05/29/2019   Eczema of lower extremity 03/04/2018   Chronic venous insufficiency 03/02/2018   Personal history of colonic polyps    Family history of colonic polyps    Benign neoplasm of ascending colon    Mitral valve insufficiency 02/08/2017   Dyspnea on exertion 02/07/2017   Non-rheumatic mitral regurgitation 02/07/2017   Precordial pain 02/07/2017   CAD (coronary artery disease) 12/21/2016   Essential hypertension 12/21/2016   Compression fracture of lumbar spine, non-traumatic ([facility] 04/26/2016   Compression fracture of L3 lumbar vertebra 04/20/2016   PVD (peripheral vascular disease) ([facility] 02/24/2016   Family history of premature CAD 02/24/2016   Gastritis    Other specified diseases of esophagus    Hiatal hernia    Gastritis and gastroduodenitis    Ischemia of lower extremity    Arterial occlusion (HCC)    Atherosclerosis of aorta ([facility]    History of smoking    Hyperlipidemia    Pain in the chest    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 02/05/2016   Carotid artery stenosis 01/04/2016   Vertigo, benign paroxysmal 12/28/2015   Clinical depression 09/06/2015   History of alcoholism ([facility] 09/06/2015   Angiopathy, 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[facility] 09/06/2015   Atherosclerosis of native arteries of extremity with rest pain ([facility] 09/06/2015   Acute non-recurrent maxillary sinusitis 07/14/2015   Vaginal pruritus 05/26/2015   Chronic recurrent major depressive disorder ([facility] 03/09/2015   Osteoporosis, post-menopausal 03/09/2015   Peripheral blood vessel disorder ([facility] 03/09/2015   Acid reflux 03/09/2015   GERD (gastroesophageal reflux disease) 12/21/2014   Carotid artery narrowing 01/28/2014   Peripheral arterial occlusive disease ([facility] [place] 06/08/2011   Occlusion and stenosis of unspecified carotid artery 06/08/2011   PAD (peripheral artery disease) ([facility] 06/08/2011    JLyndel Safe Huprich PT, DPT, GCS  Drusilla Wampole SPT 10/20/2021, 8:28 AM  [email or web address] [place], [place], [address] Phone: [phone]   Fax:  [phone]  Name: Sara [name] MRN: [id number] Date of Birth: 08/18/1951

## 2021-10-20 NOTE — Telephone Encounter (Signed)
Patient left a message requesting for Eliquis prescription to fax to [place] at [phone] due to affordable cost $96. Orders will be faxed today  ?

## 2021-10-24 ENCOUNTER — Ambulatory Visit: Payer: Medicare Other

## 2021-10-24 ENCOUNTER — Other Ambulatory Visit: Payer: Self-pay

## 2021-10-24 DIAGNOSIS — M79604 Pain in right leg: Secondary | ICD-10-CM | POA: Diagnosis not present

## 2021-10-24 DIAGNOSIS — M6281 Muscle weakness (generalized): Secondary | ICD-10-CM | POA: Diagnosis not present

## 2021-10-24 DIAGNOSIS — R262 Difficulty in walking, not elsewhere classified: Secondary | ICD-10-CM

## 2021-10-24 DIAGNOSIS — G546 Phantom limb syndrome with pain: Secondary | ICD-10-CM | POA: Diagnosis not present

## 2021-10-24 NOTE — Therapy (Signed)
[facility] [address]. [place], [place], [address] Phone: [phone]   Fax:  [phone]  Physical Therapy Treatment   Patient Details  Name: Sara [name] MRN: [id number] Date of Birth: 05/22/1951 Referring Provider (PT): Dr. Delana Meyer   Encounter Date: 10/24/2021   PT End of Session - 10/24/21 1022     Visit Number 13    Number of Visits 25    Date for PT Re-Evaluation 11/28/21    Authorization Type eval: 09/05/21    PT Start Time 1025    PT Stop Time 1100    PT Time Calculation (min) 35 min    Equipment Utilized During Treatment Gait belt    Activity Tolerance Patient tolerated treatment well    Behavior During Therapy [facility] for tasks assessed/performed                 Past Medical History:  Diagnosis Date   Arthritis    right shoulder   Depression    Dyspnea    GERD (gastroesophageal reflux disease)    History of blood clots    Hyperlipidemia    Hypertension    Mild mitral regurgitation    Osteoporosis    Peripheral vascular disease ([place])    Stomach ulcer    Vascular disease    Sees Dr. Delana Meyer   Vertigo    Last episode approx Aug 2015   Wears dentures    full upper    Past Surgical History:  Procedure Laterality Date   ADENOIDECTOMY     AMPUTATION Right 08/11/2020   Procedure: AMPUTATION ABOVE KNEE;  Surgeon: [name], MD;  Location: [facility];  Service: Vascular;  Laterality: Right;   ARTERY BIOPSY Right 07/14/2019   Procedure: BIOPSY TEMPORAL ARTERY;  Surgeon: [name], MD;  Location: [facility];  Service: Vascular;  Laterality: Right;   BREAST CYST ASPIRATION Left    CARDIAC CATHETERIZATION  02/15/2017   [facility]   COLONOSCOPY WITH PROPOFOL N/A 10/22/2017   Procedure: COLONOSCOPY WITH PROPOFOL;  Surgeon: Lucilla Lame, MD;  Location: [place];  Service: Endoscopy;  Laterality: N/A;  specimens not taken--pt on Plavix will be brought back in after 7 days off med    COLONOSCOPY WITH PROPOFOL N/A 11/05/2017   Procedure: COLONOSCOPY WITH PROPOFOL;  Surgeon: Lucilla Lame, MD;  Location: [place];  Service: Endoscopy;  Laterality: N/A;   ESOPHAGOGASTRODUODENOSCOPY N/A 12/21/2014   Procedure: ESOPHAGOGASTRODUODENOSCOPY (EGD);  Surgeon: Lucilla Lame, MD;  Location: [place];  Service: Gastroenterology;  Laterality: N/A;   ESOPHAGOGASTRODUODENOSCOPY (EGD) WITH PROPOFOL N/A 02/08/2016   Procedure: ESOPHAGOGASTRODUODENOSCOPY (EGD) WITH PROPOFOL;  Surgeon: Lucilla Lame, MD;  Location: [facility];  Service: Endoscopy;  Laterality: N/A;   FASCIOTOMY Right 05/30/2019   Procedure: FASCIOTOMY;  Surgeon: [name], MD;  Location: [facility];  Service: Vascular;  Laterality: Right;   FASCIOTOMY CLOSURE Right 06/04/2019   Procedure: FASCIOTOMY CLOSURE;  Surgeon: [name], MD;  Location: [facility];  Service: Vascular;  Laterality: Right;   HEMORROIDECTOMY  2014   LOWER EXTREMITY ANGIOGRAPHY Right 05/30/2019   Procedure: LOWER EXTREMITY ANGIOGRAPHY;  Surgeon: [name], MD;  Location: [place] [facility];  Service: Cardiovascular;  Laterality: Right;   LOWER EXTREMITY ANGIOGRAPHY Right 07/02/2020   Procedure: LOWER EXTREMITY ANGIOGRAPHY;  Surgeon: [name], MD;  Location: [place] [facility];  Service: Cardiovascular;  Laterality: Right;   PERIPHERAL VASCULAR CATHETERIZATION N/A 02/09/2016   Procedure: Abdominal Aortogram  w/Lower Extremity;  Surgeon: [name], MD;  Location: [place] [facility];  Service: Cardiovascular;  Laterality: N/A;   PERIPHERAL VASCULAR CATHETERIZATION Right 02/10/2016   Procedure: Lower Extremity Angiography;  Surgeon: [name], MD;  Location: [place] [facility];  Service: Cardiovascular;  Laterality: Right;   POLYPECTOMY  11/05/2017   Procedure: POLYPECTOMY;  Surgeon: Lucilla Lame, MD;  Location: [place];  Service: Endoscopy;;   SHOULDER ARTHROSCOPY WITH ROTATOR CUFF  REPAIR AND SUBACROMIAL DECOMPRESSION Right 02/27/2020   Procedure: RIGHT SHOULDER ARTHROSCOPY SUBACROMIAL DECOMPRESSION, DISTAL CLAVICLE EXCISION AND MINI-OPEN ROTATOR CUFF REPAIR;  Surgeon: Thornton Park, MD;  Location: [facility];  Service: Orthopedics;  Laterality: Right;   TONSILLECTOMY     VASCULAR SURGERY  [id number]   Fem-Pop Bypass    There were no vitals filed for this visit.   Subjective Assessment - 10/24/21 1021     Subjective Pt states her phantom pain bothered her a lot last night. Her shoulder is feeling alright today. She wants to return to ortho to see if she is eligible for another shoulder injection.    Pertinent History Pt reports slight worsening RLE phantom pain since ending therapy. She is taking gabapentin which helps with the pain however it is still very uncomfortable. Pain is continuous and is worse after extended walking with her prosthetic. She has not been performing her HEP after last discharge. She will get a suction fit socket for her RLE prosthesis this Friday.   History from 01/26/21: Pt has a history of RLE vascular issues since 1991. 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 She received her RLE prosthetic in April 2022 and has been receiving [facility] since her surgery. They discharged her yesterday to continue with OP PT. She continues with phantom RLE pain, especially at night and takes gabapentin with minimal relief. She has also been having R shoulder pain since the surgery which she attributes to using a wheelchair. She underwent R RTC surgery in September of 2021 and had physical therapy afterwards. She has been having issues with bilateral carpal tunnel and has a NCV study scheduled for tomorrow.   History from 01/26/21: Pt has a history of RLE vascular issues since 1991. 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 She received her RLE prosthetic in April 2022 and has been receiving [facility] since  her surgery. They discharged her yesterday to continue with OP PT. She continues with phantom RLE pain, especially at night and takes gabapentin with minimal relief. She has also been having R shoulder pain since the surgery which she attributes to using a wheelchair. She underwent R RTC surgery in September of 2021 and had physical therapy afterwards. She has been having issues with bilateral carpal tunnel and has a NCV study scheduled for tomorrow.    Limitations Walking    Diagnostic tests see history    Patient Stated Goals Ambulate with her prosthetic                   TREATMENT   Ther-ex  With prosthetic doffed: Supine R SLR hip flexion with manual resistance from therapist 2 x 20; Supine RLE long axis manually resisted external and internal rotation 2 x 20 each; Supine RLE manually resisted abduction and adduction 2 x 20 each; Supine BLE bridge with residual RLE resting on stool (towel on top for cushion), arms at side, 3s hold x 20, RLE single  leg bridge x 20;   Neuromuscular Re-education  Mirror therapy performed with patient sitting at edge of mat table and mirror between legs to simulate right lower extremity.  While watching the mirror with LLE patient performed seated L hip flexion marches x 1 minute with 3# ankle weight, L long arc quads with 3# ankle weight x 1 minute, L ankle AROM plantar flexion/dorsiflexion x 1 minute, L hip abduction/adduction marching x 1 minute, L ankle ball circles CW/CCW x 1 minute; Practiced dynamic balance with gait in rehab gym weaving between cones using single point cane in LUE. No overt LOB however pt reports feeling considerably less stable compared to using [place] crutch. She requires some direction for turns from therapist as well as close [place].   Pt educated throughout session about proper posture and technique with exercises. Improved exercise technique, movement at target joints, use of target muscles after min to mod verbal,  visual, tactile cues.    Pt demonstrated excellent motivation in session today. Due to increasing phantom limb pain continued with supine strengthening and mirror therapy. Practiced gait in gym with single point cane however pt reports feeling too unstable to utilize at home despite no overt LOB. Pt would benefit from continued PT services to address deficits in endurance, strength and balance in order to return to full function at home and reduce future risk of falls.      PT Short Term Goals - 09/05/21 1215       PT SHORT TERM GOAL #1   Title Pt will be independent with HEP in order to improve strength and balance as well as decrase pain in order to decrease fall risk and improve pain-free function at home.    Time 6    Period Weeks    Status New    Target Date 10/17/21               PT Long Term Goals - 10/12/21 1101       PT LONG TERM GOAL #1   Title Pt will decrease worst RLE phantom pain by at least 3 points on the NPRS in order to improve pain-free function at home    Baseline 09/05/21: Worst: 7/10    Time 12    Period Weeks    Status Partially Met    Target Date 11/28/21      PT LONG TERM GOAL #2   Title Pt will improve ABC by at least 13% in order to demonstrate clinically significant improvement in balance confidence.    Baseline 09/05/21: To be completed 10/12/21: 92.7%    Time 12    Period Weeks    Status Partially Met    Target Date 11/28/21      PT LONG TERM GOAL #3   Title Pt will improve FOTO to at least 59 in order to demonstrate clinically significant improvement in function related to her RLE amputation    Baseline 09/05/21: 53 10/12/21: 58    Time 12    Period Weeks    Status Partially Met    Target Date 11/28/21      PT LONG TERM GOAL #4   Title Pt will be able to walk for 10 minutes while keeping RPE below 4 without needing to sit for purposes of building cardiovascular endurance and being able to functional ambulate throughout the community for  tasks such as shopping    Time 4    Period Weeks    Status On-going    Target  Date 11/09/21                   Plan - 10/24/21 1022     Clinical Impression Statement Pt demonstrated excellent motivation in session today. Due to increasing phantom limb pain continued with supine strengthening and mirror therapy. Practiced gait in gym with single point cane however pt reports feeling too unstable to utilize at home despite no overt LOB. Pt would benefit from continued PT services to address deficits in endurance, strength and balance in order to return to full function at home and reduce future risk of falls.    Personal Factors and Comorbidities Age;Comorbidity 3+    Comorbidities Depression, osteoporosis, vascular disease    Examination-Activity Limitations Bathing;Locomotion Level;Squat;Stairs;Stand;Transfers    Examination-Participation Restrictions Cleaning;Community Activity;Meal Prep;Yard Work    Stability/Clinical Decision Making Unstable/Unpredictable    Rehab Potential Good    PT Frequency 2x / week    PT Duration 12 weeks    PT Treatment/Interventions ADLs/Self Care Home Management;Aquatic Therapy;Canalith Repostioning;Cryotherapy;Electrical Stimulation;Iontophoresis 103m/ml Dexamethasone;Moist Heat;Traction;Ultrasound;Gait training;Stair training;Therapeutic activities;Therapeutic exercise;Neuromuscular re-education;Manual techniques;Dry needling;Vestibular;Spinal Manipulations;Joint Manipulations;Balance training;Patient/family education;Cognitive remediation;Passive range of motion;Visual/perceptual remediation/compensation    PT Next Visit Plan mirror therapy, desensitization techniques (STM, superficial stimulation)    PT Home Exercise Plan Last episode access code: [email or web address]    Consulted and Agree with Plan of Care Patient                  Patient will benefit from skilled therapeutic intervention in order to improve the following deficits and impairments:   Abnormal gait, Decreased strength, Difficulty walking, Pain  Visit Diagnosis: Muscle weakness (generalized)  Difficulty in walking, not elsewhere classified  Pain in right leg     Problem List Patient Active Problem List   Diagnosis Date Noted   Muscle spasm 09/09/2020   S/P AKA (above knee amputation), right ([facility] 09/08/2020   Ischemia of extremity 07/02/2020   Chronic pain in right shoulder 11/25/2019   Encounter for long-term (current) use of high-risk medication 07/16/2019   GCA (giant cell arteritis) ([facility] 07/16/2019   Temporal arteritis ([facility] 07/13/2019   Postoperative wound infection 06/23/2019   Ischemic leg 05/31/2019   Atherosclerosis of native arteries of extremity with intermittent claudication ([facility] 05/30/2019   Depression 05/29/2019   Eczema of lower extremity 03/04/2018   Chronic venous insufficiency 03/02/2018   Personal history of colonic polyps    Family history of colonic polyps    Benign neoplasm of ascending colon    Mitral valve insufficiency 02/08/2017   Dyspnea on exertion 02/07/2017   Non-rheumatic mitral regurgitation 02/07/2017   Precordial pain 02/07/2017   CAD (coronary artery disease) 12/21/2016   Essential hypertension 12/21/2016   Compression fracture of lumbar spine, non-traumatic ([facility] 04/26/2016   Compression fracture of L3 lumbar vertebra 04/20/2016   PVD (peripheral vascular disease) ([facility] 02/24/2016   Family history of premature CAD 02/24/2016   Gastritis    Other specified diseases of esophagus    Hiatal hernia    Gastritis and gastroduodenitis    Ischemia of lower extremity    Arterial occlusion (H[facility])    Atherosclerosis of aorta ([facility]    History of smoking    Hyperlipidemia    Pain in the chest    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 02/05/2016   Carotid artery stenosis 01/04/2016   Vertigo, benign paroxysmal 12/28/2015   Clinical depression 09/06/2015   History of alcoholism ([facility] 09/06/2015    Angiopathy, 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[facility] 09/06/2015  Atherosclerosis of native arteries of extremity with rest pain ([place]) 09/06/2015   Acute non-recurrent maxillary sinusitis 07/14/2015   Vaginal pruritus 05/26/2015   Chronic recurrent major depressive disorder ([place]) 03/09/2015   Osteoporosis, post-menopausal 03/09/2015   Peripheral blood vessel disorder ([place]) 03/09/2015   Acid reflux 03/09/2015   GERD (gastroesophageal reflux disease) 12/21/2014   Carotid artery narrowing 01/28/2014   Peripheral arterial occlusive disease ([place]) 06/08/2011   Occlusion and stenosis of unspecified carotid artery 06/08/2011   PAD (peripheral artery disease) ([place]) 06/08/2011    Phillips Grout PT, DPT, GCS  10/24/2021, 11:26 AM  [facility] [address] [place], [place], [address] Phone: [phone]   Fax:  [phone]  Name: CATHERINA PATES MRN: [id number] Date of Birth: Nov 21, 1950

## 2021-10-25 DIAGNOSIS — E785 Hyperlipidemia, unspecified: Secondary | ICD-10-CM | POA: Diagnosis not present

## 2021-10-25 DIAGNOSIS — I10 Essential (primary) hypertension: Secondary | ICD-10-CM | POA: Diagnosis not present

## 2021-10-25 DIAGNOSIS — R7303 Prediabetes: Secondary | ICD-10-CM | POA: Diagnosis not present

## 2021-10-25 DIAGNOSIS — D509 Iron deficiency anemia, unspecified: Secondary | ICD-10-CM | POA: Diagnosis not present

## 2021-10-26 ENCOUNTER — Ambulatory Visit: Payer: Medicare Other

## 2021-10-26 ENCOUNTER — Other Ambulatory Visit: Payer: Self-pay

## 2021-10-26 DIAGNOSIS — R262 Difficulty in walking, not elsewhere classified: Secondary | ICD-10-CM | POA: Diagnosis not present

## 2021-10-26 DIAGNOSIS — M79604 Pain in right leg: Secondary | ICD-10-CM | POA: Diagnosis not present

## 2021-10-26 DIAGNOSIS — M6281 Muscle weakness (generalized): Secondary | ICD-10-CM | POA: Diagnosis not present

## 2021-10-26 DIAGNOSIS — G546 Phantom limb syndrome with pain: Secondary | ICD-10-CM | POA: Diagnosis not present

## 2021-10-26 NOTE — Therapy (Signed)
[facility] [address] [place], [place], [address] Phone: [phone]   Fax:  [phone]  Physical Therapy Treatment   Patient Details  Name: Sara [name] MRN: [id number] Date of Birth: 04/03/1951 Referring Provider (PT): Dr. Delana Meyer   Encounter Date: 10/26/2021   PT End of Session - 10/26/21 1211     Visit Number 14    Number of Visits 25    Date for PT Re-Evaluation 11/28/21    Authorization Type eval: 09/05/21    PT Start Time 1015    PT Stop Time 1100    PT Time Calculation (min) 45 min    Equipment Utilized During Treatment Gait belt    Activity Tolerance Patient tolerated treatment well    Behavior During Therapy [facility] for tasks assessed/performed                  Past Medical History:  Diagnosis Date   Arthritis    right shoulder   Depression    Dyspnea    GERD (gastroesophageal reflux disease)    History of blood clots    Hyperlipidemia    Hypertension    Mild mitral regurgitation    Osteoporosis    Peripheral vascular disease ([place])    Stomach ulcer    Vascular disease    Sees Dr. Delana Meyer   Vertigo    Last episode approx Aug 2015   Wears dentures    full upper    Past Surgical History:  Procedure Laterality Date   ADENOIDECTOMY     AMPUTATION Right 08/11/2020   Procedure: AMPUTATION ABOVE KNEE;  Surgeon: [name], MD;  Location: [facility];  Service: Vascular;  Laterality: Right;   ARTERY BIOPSY Right 07/14/2019   Procedure: BIOPSY TEMPORAL ARTERY;  Surgeon: [name], MD;  Location: [facility];  Service: Vascular;  Laterality: Right;   BREAST CYST ASPIRATION Left    CARDIAC CATHETERIZATION  02/15/2017   [facility]   COLONOSCOPY WITH PROPOFOL N/A 10/22/2017   Procedure: COLONOSCOPY WITH PROPOFOL;  Surgeon: Lucilla Lame, MD;  Location: [place];  Service: Endoscopy;  Laterality: N/A;  specimens not taken--pt on Plavix will be brought back in after 7 days off med    COLONOSCOPY WITH PROPOFOL N/A 11/05/2017   Procedure: COLONOSCOPY WITH PROPOFOL;  Surgeon: Lucilla Lame, MD;  Location: [place];  Service: Endoscopy;  Laterality: N/A;   ESOPHAGOGASTRODUODENOSCOPY N/A 12/21/2014   Procedure: ESOPHAGOGASTRODUODENOSCOPY (EGD);  Surgeon: Lucilla Lame, MD;  Location: [place];  Service: Gastroenterology;  Laterality: N/A;   ESOPHAGOGASTRODUODENOSCOPY (EGD) WITH PROPOFOL N/A 02/08/2016   Procedure: ESOPHAGOGASTRODUODENOSCOPY (EGD) WITH PROPOFOL;  Surgeon: Lucilla Lame, MD;  Location: [facility];  Service: Endoscopy;  Laterality: N/A;   FASCIOTOMY Right 05/30/2019   Procedure: FASCIOTOMY;  Surgeon: [name], MD;  Location: [facility];  Service: Vascular;  Laterality: Right;   FASCIOTOMY CLOSURE Right 06/04/2019   Procedure: FASCIOTOMY CLOSURE;  Surgeon: [name], MD;  Location: [facility];  Service: Vascular;  Laterality: Right;   HEMORROIDECTOMY  2014   LOWER EXTREMITY ANGIOGRAPHY Right 05/30/2019   Procedure: LOWER EXTREMITY ANGIOGRAPHY;  Surgeon: [name], MD;  Location: [place] [facility];  Service: Cardiovascular;  Laterality: Right;   LOWER EXTREMITY ANGIOGRAPHY Right 07/02/2020   Procedure: LOWER EXTREMITY ANGIOGRAPHY;  Surgeon: [name], MD;  Location: [place] [facility];  Service: Cardiovascular;  Laterality: Right;   PERIPHERAL VASCULAR CATHETERIZATION N/A 02/09/2016   Procedure: Abdominal  Aortogram w/Lower Extremity;  Surgeon: [name], MD;  Location: [place] [facility];  Service: Cardiovascular;  Laterality: N/A;   PERIPHERAL VASCULAR CATHETERIZATION Right 02/10/2016   Procedure: Lower Extremity Angiography;  Surgeon: [name], MD;  Location: [place] [facility];  Service: Cardiovascular;  Laterality: Right;   POLYPECTOMY  11/05/2017   Procedure: POLYPECTOMY;  Surgeon: Lucilla Lame, MD;  Location: [place];  Service: Endoscopy;;   SHOULDER ARTHROSCOPY WITH ROTATOR CUFF  REPAIR AND SUBACROMIAL DECOMPRESSION Right 02/27/2020   Procedure: RIGHT SHOULDER ARTHROSCOPY SUBACROMIAL DECOMPRESSION, DISTAL CLAVICLE EXCISION AND MINI-OPEN ROTATOR CUFF REPAIR;  Surgeon: Thornton Park, MD;  Location: [facility];  Service: Orthopedics;  Laterality: Right;   TONSILLECTOMY     VASCULAR SURGERY  9166,0600   Fem-Pop Bypass    There were no vitals filed for this visit.   Subjective Assessment - 10/26/21 1210     Subjective Pt states her phantom pain continues to remain unchanged. She is having a lot of pain in her R shoulder today. No signifciant changes since the last therapy session.    Pertinent History Pt reports slight worsening RLE phantom pain since ending therapy. She is taking gabapentin which helps with the pain however it is still very uncomfortable. Pain is continuous and is worse after extended walking with her prosthetic. She has not been performing her HEP after last discharge. She will get a suction fit socket for her RLE prosthesis this Friday.   History from 01/26/21: Pt has a history of RLE vascular issues since 1991. 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 She received her RLE prosthetic in April 2022 and has been receiving [facility] since her surgery. They discharged her yesterday to continue with [facility] PT. She continues with phantom RLE pain, especially at night and takes gabapentin with minimal relief. She has also been having R shoulder pain since the surgery which she attributes to using a wheelchair. She underwent R RTC surgery in September of 2021 and had physical therapy afterwards. She has been having issues with bilateral carpal tunnel and has a NCV study scheduled for tomorrow.   History from 01/26/21: Pt has a history of RLE vascular issues since 1991. 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 She received her RLE prosthetic in April 2022 and has been receiving [facility] since her surgery. They  discharged her yesterday to continue with OP PT. She continues with phantom RLE pain, especially at night and takes gabapentin with minimal relief. She has also been having R shoulder pain since the surgery which she attributes to using a wheelchair. She underwent R RTC surgery in September of 2021 and had physical therapy afterwards. She has been having issues with bilateral carpal tunnel and has a NCV study scheduled for tomorrow.    Limitations Walking    Diagnostic tests see history    Patient Stated Goals Ambulate with her prosthetic                TREATMENT   Ther-ex  With prosthetic doffed: Supine R SLR hip flexion with manual resistance from therapist 2 x 20; Supine RLE long axis manually resisted external and internal rotation 2 x 20 each; Supine RLE manually resisted abduction and adduction 2 x 20 each; Supine RLE single leg bridge with residual RLE resting on stool (towel on top for cushion), arms at side, 3s hold x 20,  L sidelying manually resisted R  hip abduction 2 x 20; L sidelying manually resisted R hip extension 2 x 20;   Neuromuscular Re-education  Mirror therapy performed with patient sitting at edge of mat table and mirror between legs to simulate right lower extremity.  While watching the mirror with LLE patient performed seated L hip flexion marches x 1 minute with 3# ankle weight, L long arc quads with 3# ankle weight x 1 minute, L ankle AROM plantar flexion/dorsiflexion x 1 minute, L hip abduction/adduction marching x 1 minute, L ankle ball circles CW/CCW x 1 minute; Practiced stairs with prosthetic, 4 steps x 2 with good stability noted by patient, BUE support on hand rails;   Pt educated throughout session about proper posture and technique with exercises. Improved exercise technique, movement at target joints, use of target muscles after min to mod verbal, visual, tactile cues.    Pt demonstrated excellent motivation in session today. Pt reporting high  levels of R shoulder pain so focused on mat table and seated strengthening. She is reporting no change in her phantom limb pain and increasing comfort using her suction fit socket. Will update her outcome measures/goals soon to assess for possible discharge. Pt would benefit from continued PT services to address deficits in endurance, strength and balance in order to return to full function at home and reduce future risk of falls.      PT Short Term Goals - 09/05/21 1215       PT SHORT TERM GOAL #1   Title Pt will be independent with HEP in order to improve strength and balance as well as decrase pain in order to decrease fall risk and improve pain-free function at home.    Time 6    Period Weeks    Status New    Target Date 10/17/21               PT Long Term Goals - 10/12/21 1101       PT LONG TERM GOAL #1   Title Pt will decrease worst RLE phantom pain by at least 3 points on the NPRS in order to improve pain-free function at home    Baseline 09/05/21: Worst: 7/10    Time 12    Period Weeks    Status Partially Met    Target Date 11/28/21      PT LONG TERM GOAL #2   Title Pt will improve ABC by at least 13% in order to demonstrate clinically significant improvement in balance confidence.    Baseline 09/05/21: To be completed 10/12/21: 92.7%    Time 12    Period Weeks    Status Partially Met    Target Date 11/28/21      PT LONG TERM GOAL #3   Title Pt will improve FOTO to at least 59 in order to demonstrate clinically significant improvement in function related to her RLE amputation    Baseline 09/05/21: 53 10/12/21: 58    Time 12    Period Weeks    Status Partially Met    Target Date 11/28/21      PT LONG TERM GOAL #4   Title Pt will be able to walk for 10 minutes while keeping RPE below 4 without needing to sit for purposes of building cardiovascular endurance and being able to functional ambulate throughout the community for tasks such as shopping    Time 4     Period Weeks    Status On-going    Target Date 11/09/21  Plan - 10/26/21 1212     Clinical Impression Statement Pt demonstrated excellent motivation in session today. Pt reporting high levels of R shoulder pain so focused on mat table and seated strengthening. She is reporting no change in her phantom limb pain and increasing comfort using her suction fit socket. Will update her outcome measures/goals soon to assess for possible discharge. Pt would benefit from continued PT services to address deficits in endurance, strength and balance in order to return to full function at home and reduce future risk of falls.    Personal Factors and Comorbidities Age;Comorbidity 3+    Comorbidities Depression, osteoporosis, vascular disease    Examination-Activity Limitations Bathing;Locomotion Level;Squat;Stairs;Stand;Transfers    Examination-Participation Restrictions Cleaning;Community Activity;Meal Prep;Yard Work    Stability/Clinical Decision Making Unstable/Unpredictable    Rehab Potential Good    PT Frequency 2x / week    PT Duration 12 weeks    PT Treatment/Interventions ADLs/Self Care Home Management;Aquatic Therapy;Canalith Repostioning;Cryotherapy;Electrical Stimulation;Iontophoresis 67m/ml Dexamethasone;Moist Heat;Traction;Ultrasound;Gait training;Stair training;Therapeutic activities;Therapeutic exercise;Neuromuscular re-education;Manual techniques;Dry needling;Vestibular;Spinal Manipulations;Joint Manipulations;Balance training;Patient/family education;Cognitive remediation;Passive range of motion;Visual/perceptual remediation/compensation    PT Next Visit Plan mirror therapy, desensitization techniques (STM, superficial stimulation)    PT Home Exercise Plan Last episode access code: [email or web address]    Consulted and Agree with Plan of Care Patient                   Patient will benefit from skilled therapeutic intervention in order to improve the following  deficits and impairments:  Abnormal gait, Decreased strength, Difficulty walking, Pain  Visit Diagnosis: Muscle weakness (generalized)  Difficulty in walking, not elsewhere classified  Pain in right leg     Problem List Patient Active Problem List   Diagnosis Date Noted   Muscle spasm 09/09/2020   S/P AKA (above knee amputation), right ([place] 09/08/2020   Ischemia of extremity 07/02/2020   Chronic pain in right shoulder 11/25/2019   Encounter for long-term (current) use of high-risk medication 07/16/2019   GCA (giant cell arteritis) ([place] 07/16/2019   Temporal arteritis ([facility] 07/13/2019   Postoperative wound infection 06/23/2019   Ischemic leg 05/31/2019   Atherosclerosis of native arteries of extremity with intermittent claudication ([facility] 05/30/2019   Depression 05/29/2019   Eczema of lower extremity 03/04/2018   Chronic venous insufficiency 03/02/2018   Personal history of colonic polyps    Family history of colonic polyps    Benign neoplasm of ascending colon    Mitral valve insufficiency 02/08/2017   Dyspnea on exertion 02/07/2017   Non-rheumatic mitral regurgitation 02/07/2017   Precordial pain 02/07/2017   CAD (coronary artery disease) 12/21/2016   Essential hypertension 12/21/2016   Compression fracture of lumbar spine, non-traumatic ([facility] 04/26/2016   Compression fracture of L3 lumbar vertebra 04/20/2016   PVD (peripheral vascular disease) ([facility] 02/24/2016   Family history of premature CAD 02/24/2016   Gastritis    Other specified diseases of esophagus    Hiatal hernia    Gastritis and gastroduodenitis    Ischemia of lower extremity    Arterial occlusion (H[facility])    Atherosclerosis of aorta ([place]    History of smoking    Hyperlipidemia    Pain in the chest    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 02/05/2016   Carotid artery stenosis 01/04/2016   Vertigo, benign paroxysmal 12/28/2015   Clinical depression 09/06/2015   History of  alcoholism ([place] 09/06/2015   Angiopathy, 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[place] 09/06/2015   Atherosclerosis of native arteries of extremity with rest  pain ([place]) 09/06/2015   Acute non-recurrent maxillary sinusitis 07/14/2015   Vaginal pruritus 05/26/2015   Chronic recurrent major depressive disorder ([place]) 03/09/2015   Osteoporosis, post-menopausal 03/09/2015   Peripheral blood vessel disorder ([place]) 03/09/2015   Acid reflux 03/09/2015   GERD (gastroesophageal reflux disease) 12/21/2014   Carotid artery narrowing 01/28/2014   Peripheral arterial occlusive disease ([place]) 06/08/2011   Occlusion and stenosis of unspecified carotid artery 06/08/2011   PAD (peripheral artery disease) ([place]) 06/08/2011    Phillips Grout PT, DPT, GCS  10/26/2021, 12:16 PM  [place] [facility] [address] [place], [place], [address] Phone: [phone]   Fax:  [phone]  Name: Sara [name] MRN: [id number] Date of Birth: 07-01-1951

## 2021-10-27 DIAGNOSIS — G4700 Insomnia, unspecified: Secondary | ICD-10-CM | POA: Diagnosis not present

## 2021-10-27 DIAGNOSIS — D509 Iron deficiency anemia, unspecified: Secondary | ICD-10-CM | POA: Diagnosis not present

## 2021-10-27 DIAGNOSIS — R7303 Prediabetes: Secondary | ICD-10-CM | POA: Diagnosis not present

## 2021-10-27 DIAGNOSIS — Z13820 Encounter for screening for osteoporosis: Secondary | ICD-10-CM | POA: Diagnosis not present

## 2021-10-27 DIAGNOSIS — I10 Essential (primary) hypertension: Secondary | ICD-10-CM | POA: Diagnosis not present

## 2021-10-27 DIAGNOSIS — I872 Venous insufficiency (chronic) (peripheral): Secondary | ICD-10-CM | POA: Diagnosis not present

## 2021-10-27 DIAGNOSIS — E785 Hyperlipidemia, unspecified: Secondary | ICD-10-CM | POA: Diagnosis not present

## 2021-10-27 DIAGNOSIS — M810 Age-related osteoporosis without current pathological fracture: Secondary | ICD-10-CM | POA: Diagnosis not present

## 2021-10-28 ENCOUNTER — Other Ambulatory Visit: Payer: Self-pay | Admitting: Nurse Practitioner

## 2021-10-28 DIAGNOSIS — Z1231 Encounter for screening mammogram for malignant neoplasm of breast: Secondary | ICD-10-CM

## 2021-10-30 NOTE — Therapy (Signed)
[facility] [address]. [place], [place], [address] Phone: [phone]   Fax:  [phone]  Physical Therapy Treatment   Patient Details  Name: Sara [name] MRN: [id number] Date of Birth: 1951/01/07 Referring Provider (PT): Dr. Delana Meyer   Encounter Date: 10/31/2021   PT End of Session - 10/31/21 1042     Visit Number 15    Number of Visits 25    Date for PT Re-Evaluation 11/28/21    Authorization Type eval: 09/05/21    PT Start Time 1015    PT Stop Time 1100    PT Time Calculation (min) 45 min    Equipment Utilized During Treatment Gait belt    Activity Tolerance Patient tolerated treatment well    Behavior During Therapy [facility] for tasks assessed/performed               Past Medical History:  Diagnosis Date   Arthritis    right shoulder   Depression    Dyspnea    GERD (gastroesophageal reflux disease)    History of blood clots    Hyperlipidemia    Hypertension    Mild mitral regurgitation    Osteoporosis    Peripheral vascular disease ([place])    Stomach ulcer    Vascular disease    Sees Dr. Delana Meyer   Vertigo    Last episode approx Aug 2015   Wears dentures    full upper    Past Surgical History:  Procedure Laterality Date   ADENOIDECTOMY     AMPUTATION Right 08/11/2020   Procedure: AMPUTATION ABOVE KNEE;  Surgeon: [name], MD;  Location: [facility];  Service: Vascular;  Laterality: Right;   ARTERY BIOPSY Right 07/14/2019   Procedure: BIOPSY TEMPORAL ARTERY;  Surgeon: [name], MD;  Location: [facility];  Service: Vascular;  Laterality: Right;   BREAST CYST ASPIRATION Left    CARDIAC CATHETERIZATION  02/15/2017   [facility]   COLONOSCOPY WITH PROPOFOL N/A 10/22/2017   Procedure: COLONOSCOPY WITH PROPOFOL;  Surgeon: Lucilla Lame, MD;  Location: [place];  Service: Endoscopy;  Laterality: N/A;  specimens not taken--pt on Plavix will be brought back in after 7 days off med   COLONOSCOPY  WITH PROPOFOL N/A 11/05/2017   Procedure: COLONOSCOPY WITH PROPOFOL;  Surgeon: Lucilla Lame, MD;  Location: [place];  Service: Endoscopy;  Laterality: N/A;   ESOPHAGOGASTRODUODENOSCOPY N/A 12/21/2014   Procedure: ESOPHAGOGASTRODUODENOSCOPY (EGD);  Surgeon: Lucilla Lame, MD;  Location: [place];  Service: Gastroenterology;  Laterality: N/A;   ESOPHAGOGASTRODUODENOSCOPY (EGD) WITH PROPOFOL N/A 02/08/2016   Procedure: ESOPHAGOGASTRODUODENOSCOPY (EGD) WITH PROPOFOL;  Surgeon: Lucilla Lame, MD;  Location: [facility];  Service: Endoscopy;  Laterality: N/A;   FASCIOTOMY Right 05/30/2019   Procedure: FASCIOTOMY;  Surgeon: [name], MD;  Location: [facility];  Service: Vascular;  Laterality: Right;   FASCIOTOMY CLOSURE Right 06/04/2019   Procedure: FASCIOTOMY CLOSURE;  Surgeon: [name], MD;  Location: [facility];  Service: Vascular;  Laterality: Right;   HEMORROIDECTOMY  2014   LOWER EXTREMITY ANGIOGRAPHY Right 05/30/2019   Procedure: LOWER EXTREMITY ANGIOGRAPHY;  Surgeon: [name], MD;  Location: [place] [facility];  Service: Cardiovascular;  Laterality: Right;   LOWER EXTREMITY ANGIOGRAPHY Right 07/02/2020   Procedure: LOWER EXTREMITY ANGIOGRAPHY;  Surgeon: [name], MD;  Location: [place] [facility];  Service: Cardiovascular;  Laterality: Right;   PERIPHERAL VASCULAR CATHETERIZATION N/A 02/09/2016   Procedure: Abdominal Aortogram w/Lower Extremity;  Surgeon: [name], MD;  Location: [place] [facility];  Service: Cardiovascular;  Laterality: N/A;   PERIPHERAL VASCULAR CATHETERIZATION Right 02/10/2016   Procedure: Lower Extremity Angiography;  Surgeon: [name], MD;  Location: [place] [facility];  Service: Cardiovascular;  Laterality: Right;   POLYPECTOMY  11/05/2017   Procedure: POLYPECTOMY;  Surgeon: Lucilla Lame, MD;  Location: [place];  Service: Endoscopy;;   SHOULDER ARTHROSCOPY WITH ROTATOR CUFF REPAIR AND  SUBACROMIAL DECOMPRESSION Right 02/27/2020   Procedure: RIGHT SHOULDER ARTHROSCOPY SUBACROMIAL DECOMPRESSION, DISTAL CLAVICLE EXCISION AND MINI-OPEN ROTATOR CUFF REPAIR;  Surgeon: Thornton Park, MD;  Location: [facility];  Service: Orthopedics;  Laterality: Right;   TONSILLECTOMY     VASCULAR SURGERY  [id number]   Fem-Pop Bypass    There were no vitals filed for this visit.   Subjective Assessment - 10/31/21 1041     Subjective Pt states her phantom pain continues to remain unchanged. She denies any phantom pain upon arrival and it is mostly bothering her at night. She denies any R shoulder pain currently upon arrival but it has still been hurting. No signifciant changes since the last therapy session.    Pertinent History Pt reports slight worsening RLE phantom pain since ending therapy. She is taking gabapentin which helps with the pain however it is still very uncomfortable. Pain is continuous and is worse after extended walking with her prosthetic. She has not been performing her HEP after last discharge. She will get a suction fit socket for her RLE prosthesis this Friday.   History from 01/26/21: Pt has a history of RLE vascular issues since 1991. 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 She received her RLE prosthetic in April 2022 and has been receiving [facility] since her surgery. They discharged her yesterday to continue with OP PT. She continues with phantom RLE pain, especially at night and takes gabapentin with minimal relief. She has also been having R shoulder pain since the surgery which she attributes to using a wheelchair. She underwent R RTC surgery in September of 2021 and had physical therapy afterwards. She has been having issues with bilateral carpal tunnel and has a NCV study scheduled for tomorrow.   History from 01/26/21: Pt has a history of RLE vascular issues since 1991. She had multiple vascular surgeries since that time resulting in R AKA  08/11/20. She received her RLE prosthetic in April 2022 and has been receiving [facility] since her surgery. They discharged her yesterday to continue with OP PT. She continues with phantom RLE pain, especially at night and takes gabapentin with minimal relief. She has also been having R shoulder pain since the surgery which she attributes to using a wheelchair. She underwent R RTC surgery in September of 2021 and had physical therapy afterwards. She has been having issues with bilateral carpal tunnel and has a NCV study scheduled for tomorrow.    Limitations Walking    Diagnostic tests see history    Patient Stated Goals Ambulate with her prosthetic                 TREATMENT   Ther-ex  With prosthetic doffed: Supine R SLR hip flexion with manual resistance from therapist x 20; Supine RLE long axis manually resisted external and internal rotation x 20 each; Supine RLE manually resisted abduction and adduction x 20 each; Supine RLE single leg bridge with residual RLE resting on stool (towel on top for cushion), x  3, discontinued secondary to pain; Supine bridges on red bolster using BLE and arms at side x 20; Knee on bolster clams and adduction with manual resistance x 20 each; L sidelying manually resisted R hip abduction x 20; L sidelying manually resisted R hip extension x 20;   Neuromuscular Re-education  Practiced stairs with prosthetic, 4 steps x 4 with good stability noted by patient, BUE support on hand rails; Practiced balance with gait in hallway performing weaving between cones, 6" step-up/down with [place] and no railing, and step-over 1/2 bolster    Pt educated throughout session about proper posture and technique with exercises. Improved exercise technique, movement at target joints, use of target muscles after min to mod verbal, visual, tactile cues.    Pt demonstrated excellent motivation in session today. Continued with mat table strengthening as well as balance  training. Performed balance training today during gait in the hallway utilizing obstacle course for weaving, step up/down, and step-over. Pt is demonstrating excellent confidence/stability with her prosthetic. Her phantom pain continues and she is waiting for a referral to see neurosurgery. Will update her outcome measures/goals at next session in anticipation of discharge. Pt would benefit from continued PT services to address deficits in endurance, strength and balance in order to return to full function at home and reduce future risk of falls.      PT Short Term Goals - 09/05/21 1215       PT SHORT TERM GOAL #1   Title Pt will be independent with HEP in order to improve strength and balance as well as decrase pain in order to decrease fall risk and improve pain-free function at home.    Time 6    Period Weeks    Status New    Target Date 10/17/21               PT Long Term Goals - 10/12/21 1101       PT LONG TERM GOAL #1   Title Pt will decrease worst RLE phantom pain by at least 3 points on the NPRS in order to improve pain-free function at home    Baseline 09/05/21: Worst: 7/10    Time 12    Period Weeks    Status Partially Met    Target Date 11/28/21      PT LONG TERM GOAL #2   Title Pt will improve ABC by at least 13% in order to demonstrate clinically significant improvement in balance confidence.    Baseline 09/05/21: To be completed 10/12/21: 92.7%    Time 12    Period Weeks    Status Partially Met    Target Date 11/28/21      PT LONG TERM GOAL #3   Title Pt will improve FOTO to at least 59 in order to demonstrate clinically significant improvement in function related to her RLE amputation    Baseline 09/05/21: 53 10/12/21: 58    Time 12    Period Weeks    Status Partially Met    Target Date 11/28/21      PT LONG TERM GOAL #4   Title Pt will be able to walk for 10 minutes while keeping RPE below 4 without needing to sit for purposes of building cardiovascular  endurance and being able to functional ambulate throughout the community for tasks such as shopping    Time 4    Period Weeks    Status On-going    Target Date 11/09/21  Plan - 10/31/21 1410     Clinical Impression Statement Pt demonstrated excellent motivation in session today. Continued with mat table strengthening as well as balance training. Performed balance training today during gait in the hallway utilizing obstacle course for weaving, step up/down, and step-over. Pt is demonstrating excellent confidence/stability with her prosthetic. Her phantom pain continues and she is waiting for a referral to see neurosurgery. Will update her outcome measures/goals at next session in anticipation of discharge. Pt would benefit from continued PT services to address deficits in endurance, strength and balance in order to return to full function at home and reduce future risk of falls.    Personal Factors and Comorbidities Age;Comorbidity 3+    Comorbidities Depression, osteoporosis, vascular disease    Examination-Activity Limitations Bathing;Locomotion Level;Squat;Stairs;Stand;Transfers    Examination-Participation Restrictions Cleaning;Community Activity;Meal Prep;Yard Work    Stability/Clinical Decision Making Unstable/Unpredictable    Rehab Potential Good    PT Frequency 2x / week    PT Duration 12 weeks    PT Treatment/Interventions ADLs/Self Care Home Management;Aquatic Therapy;Canalith Repostioning;Cryotherapy;Electrical Stimulation;Iontophoresis 55m/ml Dexamethasone;Moist Heat;Traction;Ultrasound;Gait training;Stair training;Therapeutic activities;Therapeutic exercise;Neuromuscular re-education;Manual techniques;Dry needling;Vestibular;Spinal Manipulations;Joint Manipulations;Balance training;Patient/family education;Cognitive remediation;Passive range of motion;Visual/perceptual remediation/compensation    PT Next Visit Plan Update outcome measures/goals, discharge,  mirror therapy, desensitization techniques (STM, superficial stimulation)    PT Home Exercise Plan Last episode access code: [email or web address]    Consulted and Agree with Plan of Care Patient                    Patient will benefit from skilled therapeutic intervention in order to improve the following deficits and impairments:  Abnormal gait, Decreased strength, Difficulty walking, Pain  Visit Diagnosis: Muscle weakness (generalized)  Difficulty in walking, not elsewhere classified  Pain in right leg     Problem List Patient Active Problem List   Diagnosis Date Noted   Muscle spasm 09/09/2020   S/P AKA (above knee amputation), right ([place] 09/08/2020   Ischemia of extremity 07/02/2020   Chronic pain in right shoulder 11/25/2019   Encounter for long-term (current) use of high-risk medication 07/16/2019   GCA (giant cell arteritis) ([place] 07/16/2019   Temporal arteritis ([place] 07/13/2019   Postoperative wound infection 06/23/2019   Ischemic leg 05/31/2019   Atherosclerosis of native arteries of extremity with intermittent claudication ([place] 05/30/2019   Depression 05/29/2019   Eczema of lower extremity 03/04/2018   Chronic venous insufficiency 03/02/2018   Personal history of colonic polyps    Family history of colonic polyps    Benign neoplasm of ascending colon    Mitral valve insufficiency 02/08/2017   Dyspnea on exertion 02/07/2017   Non-rheumatic mitral regurgitation 02/07/2017   Precordial pain 02/07/2017   CAD (coronary artery disease) 12/21/2016   Essential hypertension 12/21/2016   Compression fracture of lumbar spine, non-traumatic ([place] 04/26/2016   Compression fracture of L3 lumbar vertebra 04/20/2016   PVD (peripheral vascular disease) ([place] 02/24/2016   Family history of premature CAD 02/24/2016   Gastritis    Other specified diseases of esophagus    Hiatal hernia    Gastritis and gastroduodenitis    Ischemia of lower extremity    Arterial occlusion  (HCC)    Atherosclerosis of aorta ([place]    History of smoking    Hyperlipidemia    Pain in the chest    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 02/05/2016   Carotid artery stenosis 01/04/2016   Vertigo, benign paroxysmal 12/28/2015   Clinical depression 09/06/2015  History of alcoholism ([place]) 09/06/2015   Angiopathy, 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) 09/06/2015   Atherosclerosis of native arteries of extremity with rest pain ([place]) 09/06/2015   Acute non-recurrent maxillary sinusitis 07/14/2015   Vaginal pruritus 05/26/2015   Chronic recurrent major depressive disorder ([place]) 03/09/2015   Osteoporosis, post-menopausal 03/09/2015   Peripheral blood vessel disorder ([place]) 03/09/2015   Acid reflux 03/09/2015   GERD (gastroesophageal reflux disease) 12/21/2014   Carotid artery narrowing 01/28/2014   Peripheral arterial occlusive disease ([place]) 06/08/2011   Occlusion and stenosis of unspecified carotid artery 06/08/2011   PAD (peripheral artery disease) ([place]) 06/08/2011    Phillips Grout PT, DPT, GCS  10/31/2021, 2:26 PM  [facility] [address] [place], [place], [address] Phone: [phone]   Fax:  [phone]  Name: Sara [name] MRN: [id number] Date of Birth: [birth year]

## 2021-10-31 ENCOUNTER — Ambulatory Visit: Payer: Medicare Other

## 2021-10-31 ENCOUNTER — Other Ambulatory Visit: Payer: Self-pay

## 2021-10-31 DIAGNOSIS — G546 Phantom limb syndrome with pain: Secondary | ICD-10-CM | POA: Diagnosis not present

## 2021-10-31 DIAGNOSIS — M79604 Pain in right leg: Secondary | ICD-10-CM | POA: Diagnosis not present

## 2021-10-31 DIAGNOSIS — M6281 Muscle weakness (generalized): Secondary | ICD-10-CM | POA: Diagnosis not present

## 2021-10-31 DIAGNOSIS — Z20822 Contact with and (suspected) exposure to covid-19: Secondary | ICD-10-CM | POA: Diagnosis not present

## 2021-10-31 DIAGNOSIS — R262 Difficulty in walking, not elsewhere classified: Secondary | ICD-10-CM

## 2021-11-01 NOTE — Therapy (Incomplete)
[place] ?[facility] ?[address]. [place], [place], [address] ?Phone: [phone]   Fax:  [phone] ? ?Physical Therapy Treatment/Discharge ? ? ?Patient Details  ?Name: Sara [name] ?MRN: [id number] ?Date of Birth: 10-12-1950 ?Referring Provider (PT): Dr. Delana Meyer ? ? ?Encounter Date: 11/02/2021 ? ? ? ? ? ? ?Past Medical History:  ?Diagnosis Date  ? Arthritis   ? right shoulder  ? Depression   ? Dyspnea   ? GERD (gastroesophageal reflux disease)   ? History of blood clots   ? Hyperlipidemia   ? Hypertension   ? Mild mitral regurgitation   ? Osteoporosis   ? Peripheral vascular disease ([place])   ? Stomach ulcer   ? Vascular disease   ? Sees Dr. Delana Meyer  ? Vertigo   ? Last episode approx Aug 2015  ? Wears dentures   ? full upper  ? ? ?Past Surgical History:  ?Procedure Laterality Date  ? ADENOIDECTOMY    ? AMPUTATION Right 08/11/2020  ? Procedure: AMPUTATION ABOVE KNEE;  Surgeon: [name], MD;  Location: [facility];  Service: Vascular;  Laterality: Right;  ? ARTERY BIOPSY Right 07/14/2019  ? Procedure: BIOPSY TEMPORAL ARTERY;  Surgeon: [name], MD;  Location: [facility];  Service: Vascular;  Laterality: Right;  ? BREAST CYST ASPIRATION Left   ? CARDIAC CATHETERIZATION  02/15/2017  ? [facility]  ? COLONOSCOPY WITH PROPOFOL N/A 10/22/2017  ? Procedure: COLONOSCOPY WITH PROPOFOL;  Surgeon: Lucilla Lame, MD;  Location: [place];  Service: Endoscopy;  Laterality: N/A;  specimens not taken--pt on Plavix will be brought back in after 7 days off med  ? COLONOSCOPY WITH PROPOFOL N/A 11/05/2017  ? Procedure: COLONOSCOPY WITH PROPOFOL;  Surgeon: Lucilla Lame, MD;  Location: [place];  Service: Endoscopy;  Laterality: N/A;  ? ESOPHAGOGASTRODUODENOSCOPY N/A 12/21/2014  ? Procedure: ESOPHAGOGASTRODUODENOSCOPY (EGD);  Surgeon: Lucilla Lame, MD;  Location: [place];  Service: Gastroenterology;  Laterality: N/A;  ? ESOPHAGOGASTRODUODENOSCOPY (EGD) WITH  PROPOFOL N/A 02/08/2016  ? Procedure: ESOPHAGOGASTRODUODENOSCOPY (EGD) WITH PROPOFOL;  Surgeon: Lucilla Lame, MD;  Location: [facility];  Service: Endoscopy;  Laterality: N/A;  ? FASCIOTOMY Right 05/30/2019  ? Procedure: FASCIOTOMY;  Surgeon: [name], MD;  Location: [facility];  Service: Vascular;  Laterality: Right;  ? FASCIOTOMY CLOSURE Right 06/04/2019  ? Procedure: FASCIOTOMY CLOSURE;  Surgeon: [name], MD;  Location: [facility];  Service: Vascular;  Laterality: Right;  ? HEMORROIDECTOMY  2014  ? LOWER EXTREMITY ANGIOGRAPHY Right 05/30/2019  ? Procedure: LOWER EXTREMITY ANGIOGRAPHY;  Surgeon: [name], MD;  Location: [place] [facility];  Service: Cardiovascular;  Laterality: Right;  ? LOWER EXTREMITY ANGIOGRAPHY Right 07/02/2020  ? Procedure: LOWER EXTREMITY ANGIOGRAPHY;  Surgeon: [name], MD;  Location: [place] [facility];  Service: Cardiovascular;  Laterality: Right;  ? PERIPHERAL VASCULAR CATHETERIZATION N/A 02/09/2016  ? Procedure: Abdominal Aortogram w/Lower Extremity;  Surgeon: [name], MD;  Location: [place] [facility];  Service: Cardiovascular;  Laterality: N/A;  ? PERIPHERAL VASCULAR CATHETERIZATION Right 02/10/2016  ? Procedure: Lower Extremity Angiography;  Surgeon: [name], MD;  Location: [place] [facility];  Service: Cardiovascular;  Laterality: Right;  ? POLYPECTOMY  11/05/2017  ? Procedure: POLYPECTOMY;  Surgeon: Lucilla Lame, MD;  Location: [place];  Service: Endoscopy;;  ? SHOULDER ARTHROSCOPY WITH ROTATOR CUFF REPAIR AND SUBACROMIAL DECOMPRESSION Right 02/27/2020  ? Procedure: RIGHT SHOULDER ARTHROSCOPY SUBACROMIAL DECOMPRESSION, DISTAL CLAVICLE EXCISION AND MINI-OPEN  ROTATOR CUFF REPAIR;  Surgeon: Thornton Park, MD;  Location: [facility];  Service: Orthopedics;  Laterality: Right;  ? TONSILLECTOMY    ? VASCULAR SURGERY  [phone]  ? Fem-Pop Bypass  ? ? ?There were no vitals filed for this visit. ? ? ? ?Discharge, FOTO,  ABC, NPRS ? ? ? ? ?TREATMENT ? ? ?Ther-ex  ?With prosthetic doffed: ?Supine R SLR hip flexion with manual resistance from therapist x 20; ?Supine RLE long axis manually resisted external and internal rotation x 20 each; ?Supine RLE manually resisted abduction and adduction x 20 each; ?Supine RLE single leg bridge with residual RLE resting on stool (towel on top for cushion), x 3, discontinued secondary to pain; ?Supine bridges on red bolster using BLE and arms at side x 20; ?Knee on bolster clams and adduction with manual resistance x 20 each; ?L sidelying manually resisted R hip abduction x 20; ?L sidelying manually resisted R hip extension x 20; ? ? ?Neuromuscular Re-education  ?Practiced stairs with prosthetic, 4 steps x 4 with good stability noted by patient, BUE support on hand rails; ?Practiced balance with gait in hallway performing weaving between cones, 6" step-up/down with [place] and no railing, and step-over 1/2 bolster  ? ? ?Pt educated throughout session about proper posture and technique with exercises. Improved exercise technique, movement at target joints, use of target muscles after min to mod verbal, visual, tactile cues.  ? ? ?Pt demonstrated excellent motivation in session today. Continued with mat table strengthening as well as balance training. Performed balance training today during gait in the hallway utilizing obstacle course for weaving, step up/down, and step-over. Pt is demonstrating excellent confidence/stability with her prosthetic. Her phantom pain continues and she is waiting for a referral to see neurosurgery. Will update her outcome measures/goals at next session in anticipation of discharge. Pt would benefit from continued PT services to address deficits in endurance, strength and balance in order to return to full function at home and reduce future risk of falls. ? ? ? ? ? PT Short Term Goals - 09/05/21 1215   ? ?  ? PT SHORT TERM GOAL #1  ? Title Pt will be independent with  HEP in order to improve strength and balance as well as decrase pain in order to decrease fall risk and improve pain-free function at home.   ? Time 6   ? Period Weeks   ? Status New   ? Target Date 10/17/21   ? ?  ?  ? ?  ? ? ? ? PT Long Term Goals - 10/12/21 1101   ? ?  ? PT LONG TERM GOAL #1  ? Title Pt will decrease worst RLE phantom pain by at least 3 points on the NPRS in order to improve pain-free function at home   ? Baseline 09/05/21: Worst: 7/10   ? Time 12   ? Period Weeks   ? Status Partially Met   ? Target Date 11/28/21   ?  ? PT LONG TERM GOAL #2  ? Title Pt will improve ABC by at least 13% in order to demonstrate clinically significant improvement in balance confidence.   ? Baseline 09/05/21: To be completed 10/12/21: 92.7%   ? Time 12   ? Period Weeks   ? Status Partially Met   ? Target Date 11/28/21   ?  ? PT LONG TERM GOAL #3  ? Title Pt will improve FOTO to at least 59 in order to demonstrate clinically significant improvement  in function related to her RLE amputation   ? Baseline 09/05/21: 53 10/12/21: 58   ? Time 12   ? Period Weeks   ? Status Partially Met   ? Target Date 11/28/21   ?  ? PT LONG TERM GOAL #4  ? Title Pt will be able to walk for 10 minutes while keeping RPE below 4 without needing to sit for purposes of building cardiovascular endurance and being able to functional ambulate throughout the community for tasks such as shopping   ? Time 4   ? Period Weeks   ? Status On-going   ? Target Date 11/09/21   ? ?  ?  ? ?  ? ? ? ? ? ? ? ? ? ? ? ? ? ? ? ? ? ?Patient will benefit from skilled therapeutic intervention in order to improve the following deficits and impairments:    ? ?Visit Diagnosis: ?Muscle weakness (generalized) ? ?Difficulty in walking, not elsewhere classified ? ?Pain in right leg ? ? ? ? ?Problem List ?Patient Active Problem List  ? Diagnosis Date Noted  ? Muscle spasm 09/09/2020  ? S/P AKA (above knee amputation), right ([place]) 09/08/2020  ? Ischemia of extremity 07/02/2020  ?  Chronic pain in right shoulder 11/25/2019  ? Encounter for long-term (current) use of high-risk medication 07/16/2019  ? GCA (giant cell arteritis) ([place]) 07/16/2019  ? Temporal arteritis ([place]) 07/13/2019  ? Po

## 2021-11-02 ENCOUNTER — Ambulatory Visit: Payer: Medicare Other

## 2021-11-02 DIAGNOSIS — M6281 Muscle weakness (generalized): Secondary | ICD-10-CM

## 2021-11-02 DIAGNOSIS — M79604 Pain in right leg: Secondary | ICD-10-CM

## 2021-11-02 DIAGNOSIS — R262 Difficulty in walking, not elsewhere classified: Secondary | ICD-10-CM

## 2021-11-07 NOTE — Therapy (Signed)
[place] ?[facility] ?[address] [place], [place], [address] ?Phone: [phone]   Fax:  [phone] ? ?Physical Therapy Treatment/Discharge ? ? ?Patient Details  ?Name: Sara [name] ?MRN: [id number] ?Date of Birth: 09/26/1950 ?Referring Provider (PT): Dr. Delana Meyer ? ? ?Encounter Date: 11/08/2021 ? ? PT End of Session - 11/08/21 1032   ? ? Visit Number 16   ? Number of Visits 25   ? Date for PT Re-Evaluation 11/28/21   ? Authorization Type eval: 09/05/21   ? PT Start Time 1019   ? PT Stop Time 1100   ? PT Time Calculation (min) 41 min   ? Equipment Utilized During Treatment Gait belt   ? Activity Tolerance Patient tolerated treatment well   ? Behavior During Therapy [facility] for tasks assessed/performed   ? ?  ?  ? ?  ? ? ? ? ? ?Past Medical History:  ?Diagnosis Date  ? Arthritis   ? right shoulder  ? Depression   ? Dyspnea   ? GERD (gastroesophageal reflux disease)   ? History of blood clots   ? Hyperlipidemia   ? Hypertension   ? Mild mitral regurgitation   ? Osteoporosis   ? Peripheral vascular disease ([place])   ? Stomach ulcer   ? Vascular disease   ? Sees Dr. Delana Meyer  ? Vertigo   ? Last episode approx Aug 2015  ? Wears dentures   ? full upper  ? ? ?Past Surgical History:  ?Procedure Laterality Date  ? ADENOIDECTOMY    ? AMPUTATION Right 08/11/2020  ? Procedure: AMPUTATION ABOVE KNEE;  Surgeon: [name], MD;  Location: [facility];  Service: Vascular;  Laterality: Right;  ? ARTERY BIOPSY Right 07/14/2019  ? Procedure: BIOPSY TEMPORAL ARTERY;  Surgeon: [name], MD;  Location: [facility];  Service: Vascular;  Laterality: Right;  ? BREAST CYST ASPIRATION Left   ? CARDIAC CATHETERIZATION  02/15/2017  ? [facility]  ? COLONOSCOPY WITH PROPOFOL N/A 10/22/2017  ? Procedure: COLONOSCOPY WITH PROPOFOL;  Surgeon: Lucilla Lame, MD;  Location: [place];  Service: Endoscopy;  Laterality: N/A;  specimens not taken--pt on Plavix will be brought back in after 7 days off med  ?  COLONOSCOPY WITH PROPOFOL N/A 11/05/2017  ? Procedure: COLONOSCOPY WITH PROPOFOL;  Surgeon: Lucilla Lame, MD;  Location: [place];  Service: Endoscopy;  Laterality: N/A;  ? ESOPHAGOGASTRODUODENOSCOPY N/A 12/21/2014  ? Procedure: ESOPHAGOGASTRODUODENOSCOPY (EGD);  Surgeon: Lucilla Lame, MD;  Location: [place];  Service: Gastroenterology;  Laterality: N/A;  ? ESOPHAGOGASTRODUODENOSCOPY (EGD) WITH PROPOFOL N/A 02/08/2016  ? Procedure: ESOPHAGOGASTRODUODENOSCOPY (EGD) WITH PROPOFOL;  Surgeon: Lucilla Lame, MD;  Location: [facility];  Service: Endoscopy;  Laterality: N/A;  ? FASCIOTOMY Right 05/30/2019  ? Procedure: FASCIOTOMY;  Surgeon: [name], MD;  Location: [facility];  Service: Vascular;  Laterality: Right;  ? FASCIOTOMY CLOSURE Right 06/04/2019  ? Procedure: FASCIOTOMY CLOSURE;  Surgeon: [name], MD;  Location: [facility];  Service: Vascular;  Laterality: Right;  ? HEMORROIDECTOMY  2014  ? LOWER EXTREMITY ANGIOGRAPHY Right 05/30/2019  ? Procedure: LOWER EXTREMITY ANGIOGRAPHY;  Surgeon: [name], MD;  Location: [place] [facility];  Service: Cardiovascular;  Laterality: Right;  ? LOWER EXTREMITY ANGIOGRAPHY Right 07/02/2020  ? Procedure: LOWER EXTREMITY ANGIOGRAPHY;  Surgeon: [name], MD;  Location: [place] [facility];  Service: Cardiovascular;  Laterality: Right;  ? PERIPHERAL VASCULAR CATHETERIZATION N/A 02/09/2016  ? Procedure: Abdominal Aortogram w/Lower  Extremity;  Surgeon: [name], MD;  Location: [place] [facility];  Service: Cardiovascular;  Laterality: N/A;  ? PERIPHERAL VASCULAR CATHETERIZATION Right 02/10/2016  ? Procedure: Lower Extremity Angiography;  Surgeon: [name], MD;  Location: [place] [facility];  Service: Cardiovascular;  Laterality: Right;  ? POLYPECTOMY  11/05/2017  ? Procedure: POLYPECTOMY;  Surgeon: Lucilla Lame, MD;  Location: [place];  Service: Endoscopy;;  ? SHOULDER ARTHROSCOPY WITH ROTATOR CUFF  REPAIR AND SUBACROMIAL DECOMPRESSION Right 02/27/2020  ? Procedure: RIGHT SHOULDER ARTHROSCOPY SUBACROMIAL DECOMPRESSION, DISTAL CLAVICLE EXCISION AND MINI-OPEN ROTATOR CUFF REPAIR;  Surgeon: Thornton Park, MD;  Location: [facility];  Service: Orthopedics;  Laterality: Right;  ? TONSILLECTOMY    ? VASCULAR SURGERY  [phone]  ? Fem-Pop Bypass  ? ? ?There were no vitals filed for this visit. ? ? Subjective Assessment - 11/08/21 1026   ? ? Subjective Pt states her phantom pain continues to remain unchanged. She is having 8/10 R phantom foot pain upon arrival today. She fell yesterday outside of her apartment because her prosthetic buckled and she landed on her R shoulder which has been hurting since that time. She denies and R hip or RLE pain.  No specific questions or concerns currently.   ? Pertinent History Pt reports slight worsening RLE phantom pain since ending therapy. She is taking gabapentin which helps with the pain however it is still very uncomfortable. Pain is continuous and is worse after extended walking with her prosthetic. She has not been performing her HEP after last discharge. She will get a suction fit socket for her RLE prosthesis this Friday.   History from 01/26/21: Pt has a history of RLE vascular issues since 1991. 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 She received her RLE prosthetic in April 2022 and has been receiving [facility] since her surgery. They discharged her yesterday to continue with OP PT. She continues with phantom RLE pain, especially at night and takes gabapentin with minimal relief. She has also been having R shoulder pain since the surgery which she attributes to using a wheelchair. She underwent R RTC surgery in September of 2021 and had physical therapy afterwards. She has been having issues with bilateral carpal tunnel and has a NCV study scheduled for tomorrow.   History from 01/26/21: Pt has a history of RLE vascular issues since 1991.  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 She received her RLE prosthetic in April 2022 and has been receiving [facility] since her surgery. They discharged her yesterday to continue with OP PT. She continues with phantom RLE pain, especially at night and takes gabapentin with minimal relief. She has also been having R shoulder pain since the surgery which she attributes to using a wheelchair. She underwent R RTC surgery in September of 2021 and had physical therapy afterwards. She has been having issues with bilateral carpal tunnel and has a NCV study scheduled for tomorrow.   ? Limitations Walking   ? Diagnostic tests see history   ? Patient Stated Goals Ambulate with her prosthetic   ? ?  ?  ? ?  ? ? ? ? ? ? ?TREATMENT ? ? ?Ther-ex  ?Updated goals with patient (see goals); ?Education about discharge and plan of care; ? ?With prosthetic doffed: ?Supine R SLR hip flexion with manual resistance from therapist x 20; ?Supine RLE long axis manually resisted external and internal rotation x  20 each; ?Supine RLE manually resisted abduction and adduction x 20 each; ?Supine RLE single leg bridge with residual RLE resting on stool (towel on top for cushion), x 3, discontinued secondary to pain; ?Supine bridges on red bolster using BLE and arms at side x 20; ?Knee on bolster clams and adduction with manual resistance x 20 each; ?L sidelying manually resisted R hip abduction x 20; ?L sidelying manually resisted R hip extension x 20; ? ? ?Pt educated throughout session about proper posture and technique with exercises. Improved exercise technique, movement at target joints, use of target muscles after min to mod verbal, visual, tactile cues.  ? ? ?Updated outcome measures and goals with patient today. RLE phantom pain has not improved over the course of her time in therapy however it is not limiting her function. She continues to notice her phantom pain mostly at night but not as much when she  is active during the day. Her FOTO score improved from 53 at initial evaluation to 97 today. She feels confident with using her vacuum fit socket and demonstrates good stability when ambulating with th

## 2021-11-08 ENCOUNTER — Other Ambulatory Visit: Payer: Self-pay

## 2021-11-08 ENCOUNTER — Ambulatory Visit: Payer: Medicare Other

## 2021-11-08 DIAGNOSIS — M79604 Pain in right leg: Secondary | ICD-10-CM | POA: Diagnosis not present

## 2021-11-08 DIAGNOSIS — M6281 Muscle weakness (generalized): Secondary | ICD-10-CM | POA: Diagnosis not present

## 2021-11-08 DIAGNOSIS — G546 Phantom limb syndrome with pain: Secondary | ICD-10-CM | POA: Diagnosis not present

## 2021-11-08 DIAGNOSIS — R262 Difficulty in walking, not elsewhere classified: Secondary | ICD-10-CM

## 2021-11-09 ENCOUNTER — Other Ambulatory Visit: Payer: Self-pay | Admitting: Physician Assistant

## 2021-11-10 ENCOUNTER — Other Ambulatory Visit (INDEPENDENT_AMBULATORY_CARE_PROVIDER_SITE_OTHER): Payer: Self-pay | Admitting: Vascular Surgery

## 2021-11-10 ENCOUNTER — Telehealth (INDEPENDENT_AMBULATORY_CARE_PROVIDER_SITE_OTHER): Payer: Self-pay

## 2021-11-10 DIAGNOSIS — G546 Phantom limb syndrome with pain: Secondary | ICD-10-CM | POA: Insufficient documentation

## 2021-11-10 NOTE — Telephone Encounter (Signed)
Patient called the office checking on the status for her referral to sent to neurology for phantom pain. Patient was made aware that referral will be sent to Dr Manuella Ghazi office. ?

## 2021-11-17 DIAGNOSIS — M7501 Adhesive capsulitis of right shoulder: Secondary | ICD-10-CM | POA: Diagnosis not present

## 2021-11-19 DIAGNOSIS — D472 Monoclonal gammopathy: Secondary | ICD-10-CM

## 2021-11-19 HISTORY — DX: Monoclonal gammopathy: D47.2

## 2021-12-05 ENCOUNTER — Ambulatory Visit
Admission: RE | Admit: 2021-12-05 | Discharge: 2021-12-05 | Disposition: A | Discharge status: Home / Self Care | Payer: Medicare Other | Source: Ambulatory Visit | Attending: Nurse Practitioner | Admitting: Nurse Practitioner

## 2021-12-05 DIAGNOSIS — Z1231 Encounter for screening mammogram for malignant neoplasm of breast: Secondary | ICD-10-CM | POA: Diagnosis not present

## 2021-12-05 DIAGNOSIS — M7501 Adhesive capsulitis of right shoulder: Secondary | ICD-10-CM | POA: Diagnosis not present

## 2021-12-09 DIAGNOSIS — Z20822 Contact with and (suspected) exposure to covid-19: Secondary | ICD-10-CM | POA: Diagnosis not present

## 2021-12-15 IMAGING — MG MM DIGITAL SCREENING BILAT W/ TOMO AND CAD
8 series · 8 of 24 positions shown · non-contrast
Comparison: Previous exam(s).

CLINICAL DATA: Screening.

EXAM:
DIGITAL SCREENING BILATERAL MAMMOGRAM WITH TOMOSYNTHESIS AND CAD
TECHNIQUE: Bilateral screening digital craniocaudal and mediolateral oblique
mammograms were obtained. Bilateral screening digital breast
tomosynthesis was performed. The images were evaluated with
computer-aided detection.

[R MLO synth-2D]
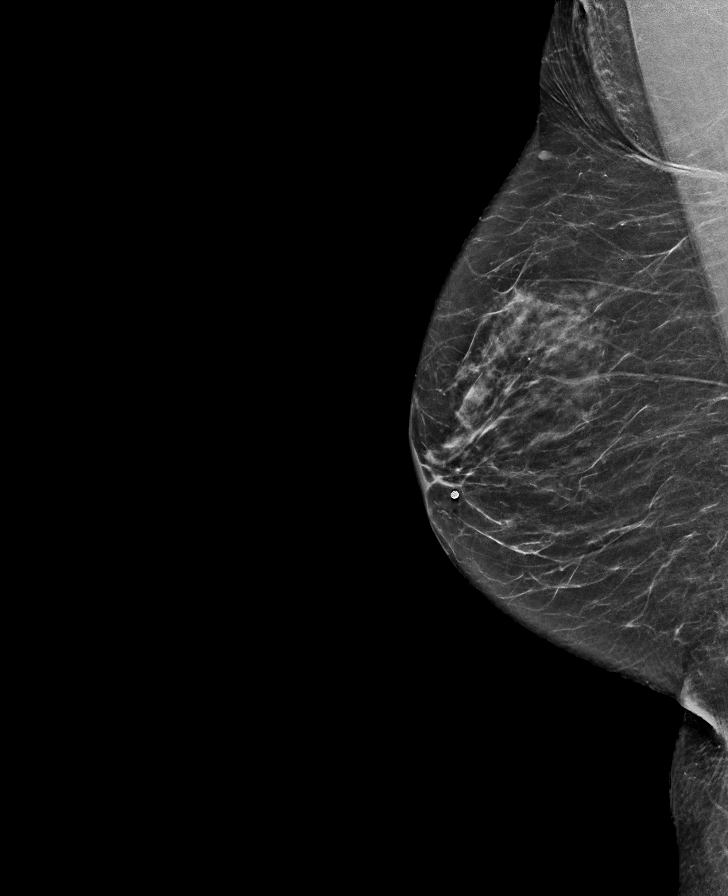

[L CC synth-2D]
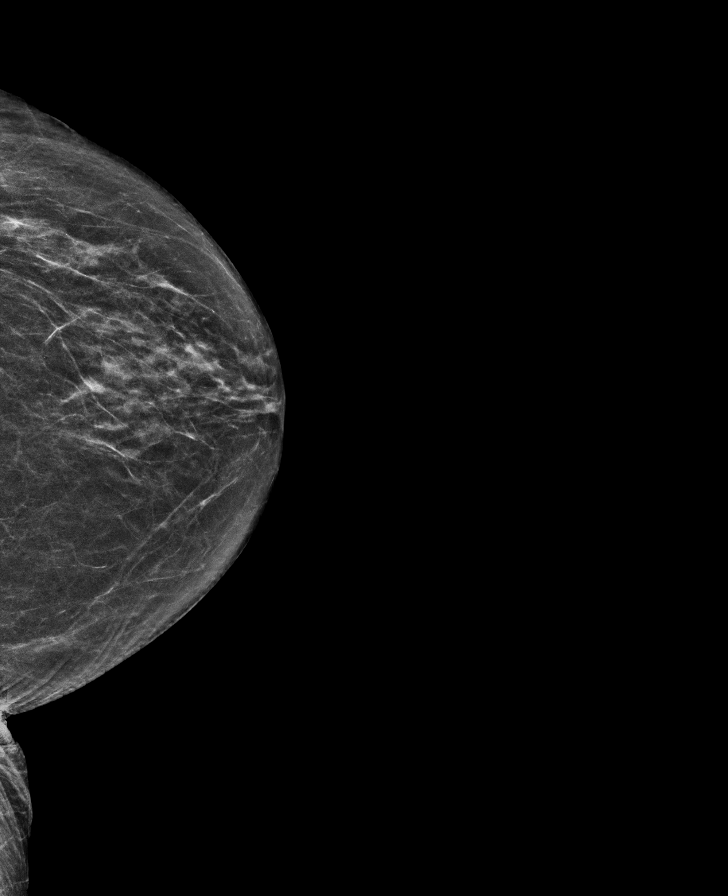

[L MLO synth-2D]
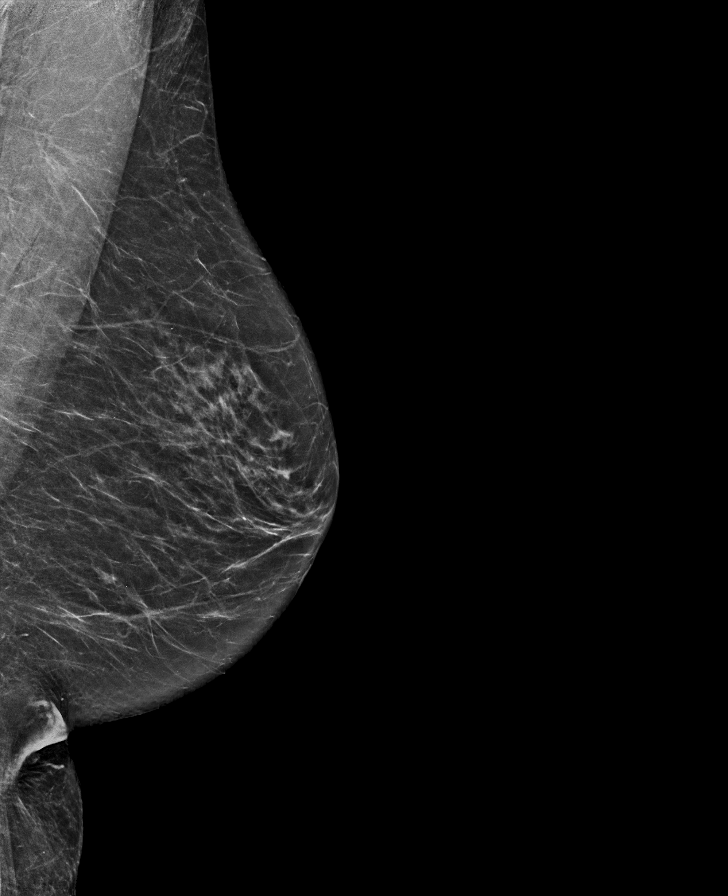

[R CC synth-2D]
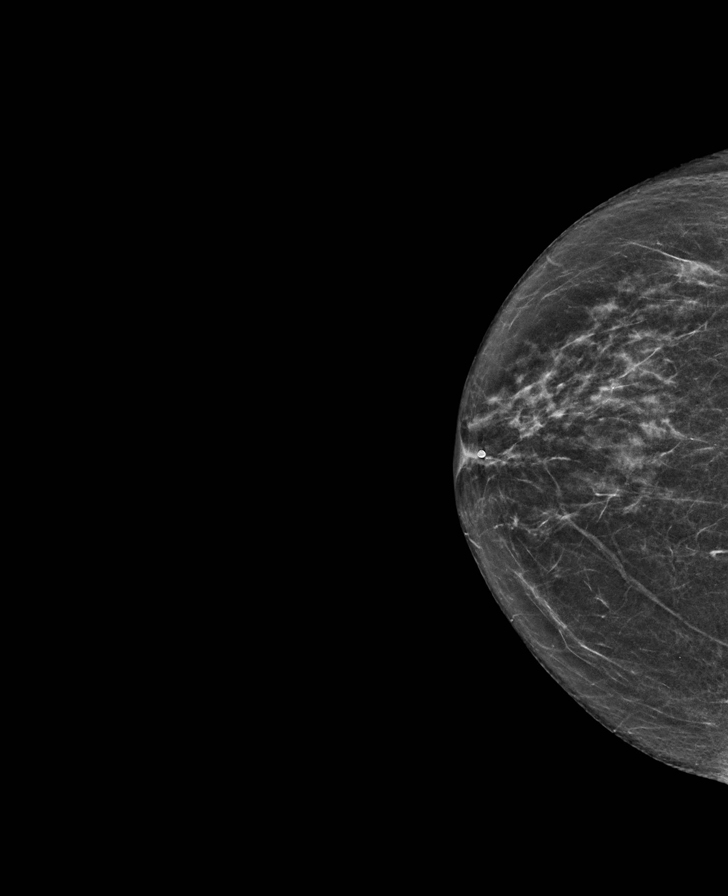

[R CC tomo · tomo slice 28/55.0]
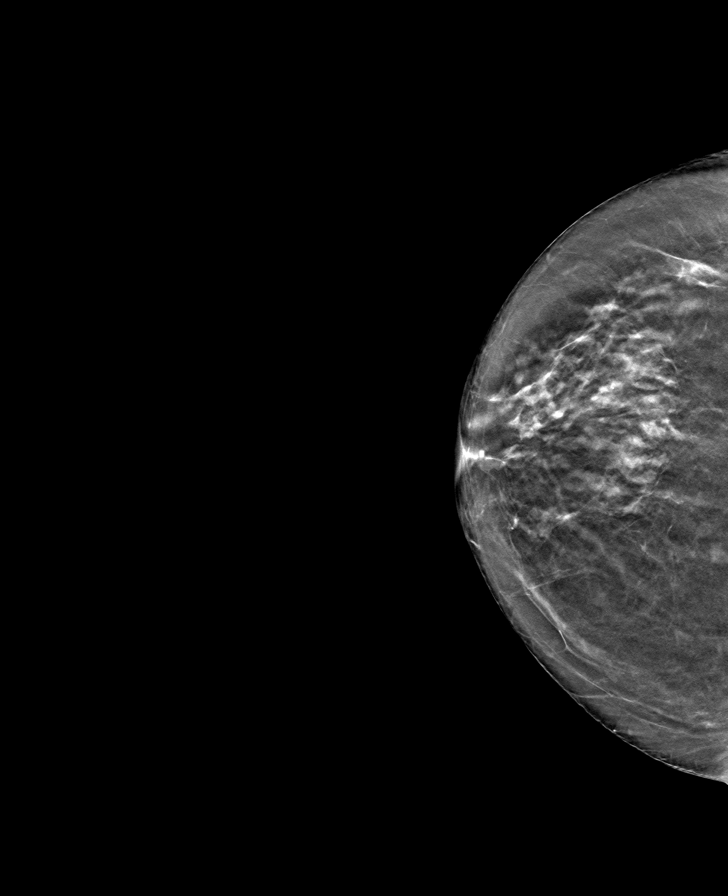

[L MLO tomo · tomo slice 32/63.0]
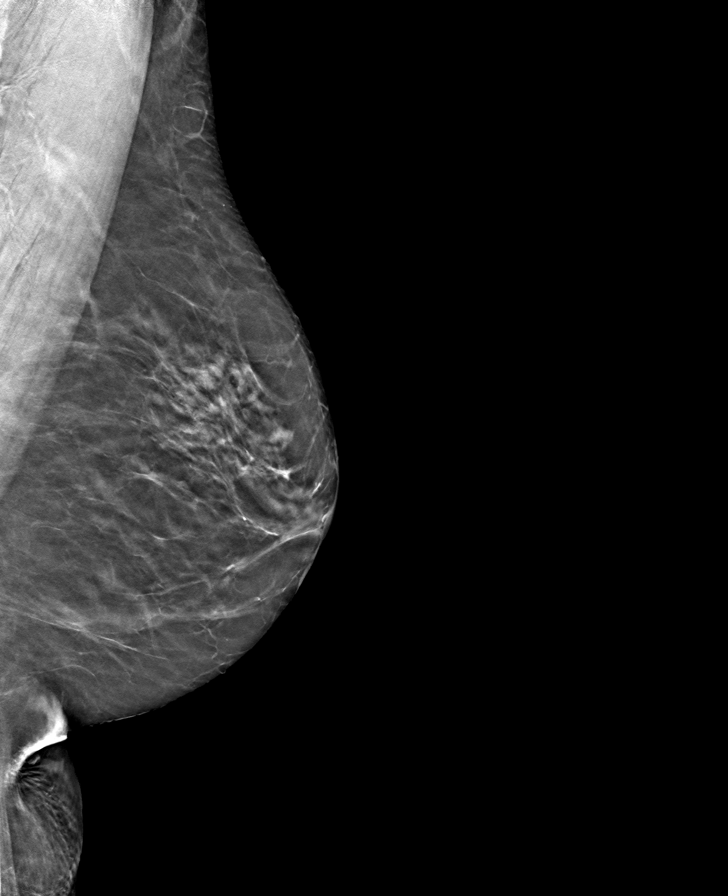

[L CC tomo · tomo slice 29/57.0]
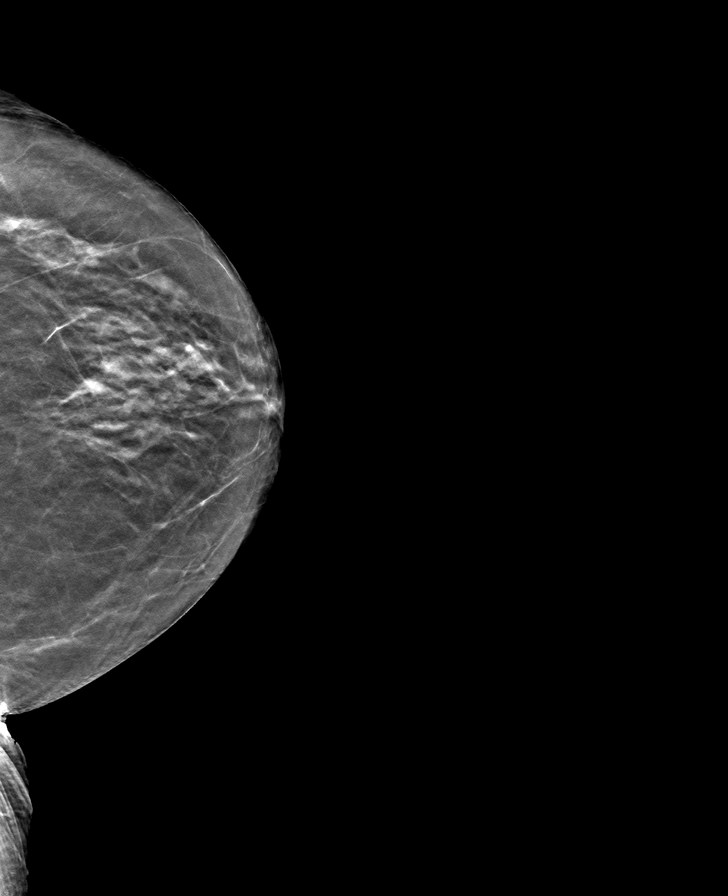

[R MLO tomo · tomo slice 31/60.0]
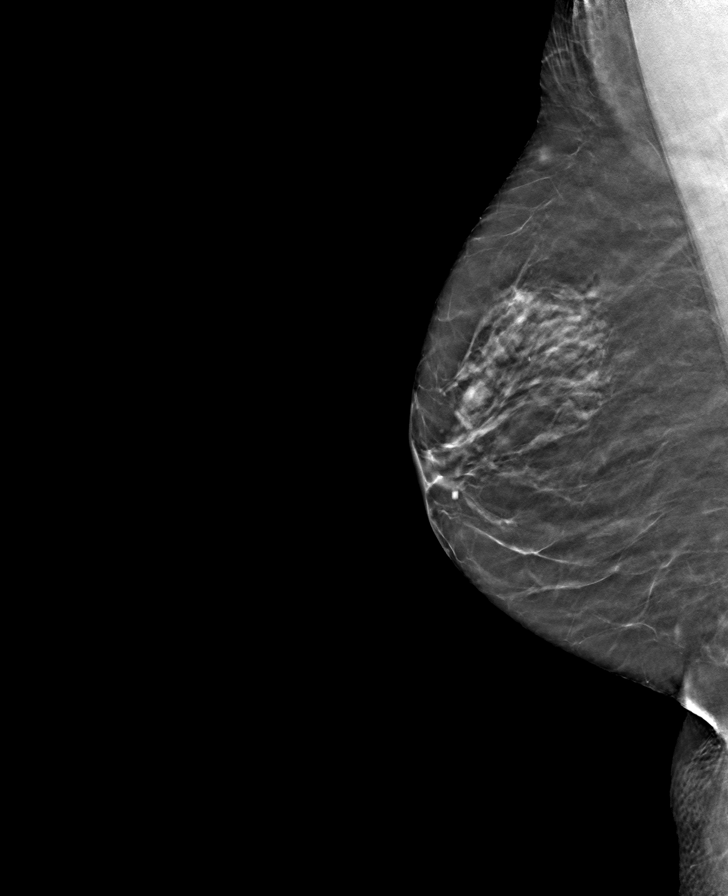

[8 of 24 positions shown; findings below may reference images not displayed]

ACR Breast Density Category b: There are scattered areas of
fibroglandular density.
FINDINGS: There are no findings suspicious for malignancy. The images were
evaluated with computer-aided detection.
IMPRESSION: No mammographic evidence of malignancy. A result letter of this
screening mammogram will be mailed directly to the patient.

RECOMMENDATION:
Screening mammogram in one year. (Code:[id number]-[id number])

BI-RADS CATEGORY  1: Negative.

## 2021-12-17 DIAGNOSIS — Z20822 Contact with and (suspected) exposure to covid-19: Secondary | ICD-10-CM | POA: Diagnosis not present

## 2021-12-20 DIAGNOSIS — Z20822 Contact with and (suspected) exposure to covid-19: Secondary | ICD-10-CM | POA: Diagnosis not present

## 2021-12-27 ENCOUNTER — Encounter: Payer: Self-pay | Admitting: Student in an Organized Health Care Education/Training Program

## 2021-12-27 ENCOUNTER — Ambulatory Visit
Payer: Medicare Other | Attending: Student in an Organized Health Care Education/Training Program | Admitting: Student in an Organized Health Care Education/Training Program

## 2021-12-27 VITALS — BP 134/88 | HR 106 | Temp 98.2°F | Resp 16 | Ht 63.0 in | Wt 120.0 lb

## 2021-12-27 DIAGNOSIS — G546 Phantom limb syndrome with pain: Secondary | ICD-10-CM | POA: Diagnosis not present

## 2021-12-27 DIAGNOSIS — Z9889 Other specified postprocedural states: Secondary | ICD-10-CM | POA: Insufficient documentation

## 2021-12-27 DIAGNOSIS — I739 Peripheral vascular disease, unspecified: Secondary | ICD-10-CM | POA: Insufficient documentation

## 2021-12-27 DIAGNOSIS — G894 Chronic pain syndrome: Secondary | ICD-10-CM | POA: Diagnosis not present

## 2021-12-27 DIAGNOSIS — H26493 Other secondary cataract, bilateral: Secondary | ICD-10-CM | POA: Diagnosis not present

## 2021-12-27 DIAGNOSIS — H353131 Nonexudative age-related macular degeneration, bilateral, early dry stage: Secondary | ICD-10-CM | POA: Diagnosis not present

## 2021-12-27 DIAGNOSIS — M12811 Other specific arthropathies, not elsewhere classified, right shoulder: Secondary | ICD-10-CM | POA: Insufficient documentation

## 2021-12-27 DIAGNOSIS — M316 Other giant cell arteritis: Secondary | ICD-10-CM | POA: Diagnosis not present

## 2021-12-27 DIAGNOSIS — Z89612 Acquired absence of left leg above knee: Secondary | ICD-10-CM | POA: Insufficient documentation

## 2021-12-27 MED ORDER — GABAPENTIN 600 MG PO TABS: ORAL_TABLET | ORAL | 0 refills | Status: DC

## 2021-12-27 NOTE — Progress Notes (Signed)
Safety precautions to be maintained throughout the outpatient stay will include: orient to surroundings, keep bed in low position, maintain call bell within reach at all times, provide assistance with transfer out of bed and ambulation.  

## 2021-12-27 NOTE — Progress Notes (Signed)
Patient: Sara [name]  Service Category: E/M  Provider: Gillis Santa, MD  ?DOB: 1951-02-04  DOS: 12/27/2021  Referring Provider: [name], MD  ?MRN: [id number]  Setting: Ambulatory outpatient  PCP: Danelle Berry, NP  ?Type: New Patient  Specialty: Interventional Pain Management    ?Location: Office  Delivery: Face-to-face    ? ?Primary Reason(s) for Visit: Encounter for initial evaluation of one or more chronic problems (new to examiner) potentially causing chronic pain, and posing a threat to normal musculoskeletal function. (Level of risk: High) ?CC: Foot Pain (RIGHT) ? ?HPI  ?[name] is a 71 y.o. year old, female patient, who comes for the first time to our practice referred by Delana Meyer, Dolores Lory, MD for our initial evaluation of her chronic pain. 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; Ischemia of lower extremity; Arterial occlusion ([place]); Atherosclerosis of aorta ([place]); History of smoking; Hyperlipidemia; Pain in the chest; Gastritis and gastroduodenitis; Gastritis; Other specified diseases of esophagus; Hiatal hernia; PVD (peripheral vascular disease) ([place]); Family history of premature CAD; Compression fracture of L3 lumbar vertebra; CAD (coronary artery disease); Essential hypertension; Family history of colonic polyps; Benign neoplasm of ascending colon; Personal history of colonic polyps; Chronic venous insufficiency; Eczema of lower extremity; Dyspnea on  exertion; Non-rheumatic mitral regurgitation; Occlusion and stenosis of unspecified carotid artery; Depression; Precordial pain; PAD (peripheral artery disease) ([place]); Compression fracture of lumbar spine, non-traumatic ([place]); Atherosclerosis of native arteries of extremity with intermittent claudication ([place]); Ischemic leg; Postoperative wound infection; Temporal arteritis ([place]); Encounter for long-term (current) use of high-risk medication; Chronic pain in right shoulder; Mitral valve insufficiency; GCA (giant cell arteritis) ([place]); Ischemia of extremity; S/P AKA (above knee amputation), right ([place]); Muscle spasm; Phantom pain after amputation of lower extremity ([place]); History of left above knee amputation [facility]) (Dec 2021); Rotator cuff arthropathy of right shoulder; Hx of rotator cuff surgery; and Chronic pain syndrome on their problem list. Today she comes in for evaluation of her Foot Pain (RIGHT) ? ?Pain Assessment: ?Location: Right Foot ?Radiating: pt states pain, on occasion, may go up to right knee ?Onset: More than a month ago ?Duration: Chronic pain ?Quality: Tingling, Pressure ?Severity: 5 /10 (subjective, self-reported pain score)  ?Effect on ADL:   ?Timing: Constant ?Modifying factors: gabapentin ?BP: 134/88  HR: (!) 106 ? ?Onset and Duration: Present longer than 3 months ?Cause of pain: Surgery ?Severity: NAS-11 at its best: 5/10, NAS-11 now: 5/10, and NAS-11 on the average: 5/10 ?Timing: Not influenced by the time of the day ?Aggravating Factors:  no aggravating factors ?Alleviating Factors: Medications ?Associated Problems: Depression ?Quality of Pain: Constant and Pulsating ?Previous Examinations or Tests: Bone scan, CT scan, Endoscopy, and MRI scan ?Previous Treatments: The patient denies any previous treatments ? 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   She is currently on gabapentin 300 mg 3 times a day as well as Cymbalta 60 mg twice a day.  The Cymbalta is also for her depression and anxiety.  She sees [facility] for her  right shoulder pain related to right shoulder rotator cuff arthropathy.  She is status post right shoulder rotator cuff surgery many years ago which was also done at [facility].  She has plans to have a right shoulder steroid injection, repeat this week.  She states that she has had these in the past and they were not effective.  She is currently on Eliquis.  She is also on hydrocodone for chronic pain which is managed by [facility].  She is here today to discuss options for her right phantom pain. ? ?PMP checked and reviewed. ? ? ?Meds  ? ?Current Outpatient Medications:  ?  apixaban (ELIQUIS) 5 MG TABS tablet, Take 1 tablet (5 mg total) by mouth 2 (two) times daily., Disp: 60 tablet, Rfl: 11 ?  atorvastatin (LIPITOR) 40 MG tablet, Take 40 mg by mouth at bedtime. , Disp: , Rfl:  ?  calcium carbonate (OSCAL) 1500 (600 Ca) MG TABS tablet, Take 1,200 mg of elemental calcium by mouth 2 (two) times daily with a meal., Disp: , Rfl:  ?  cholecalciferol (VITAMIN D3) 25 MCG (1000 UNIT) tablet, Take 1,000 Units by mouth daily., Disp: , Rfl:  ?  DULoxetine (CYMBALTA) 60 MG capsule, Take 60 mg by mouth every morning., Disp: , Rfl:  ?  gabapentin (NEURONTIN) 600 MG tablet, Take 1 tablet (600 mg total) by mouth 2 (two) times daily for 15 days, THEN 1 tablet (600 mg total) 3 (three) times daily., Disp: 120 tablet, Rfl: 0 ?  ibandronate (BONIVA) 150 MG tablet, Take 150 mg by mouth every 30 (thirty) days. , Disp: , Rfl:  ?  losartan (COZAAR) 25 MG tablet, Take 25 mg by mouth every morning. , Disp: , Rfl:  ?  omeprazole (PRILOSEC) 20 MG capsule, Take 20 mg by mouth daily., Disp: , Rfl:  ?  oxyCODONE-acetaminophen (PERCOCET/ROXICET) 5-325 MG tablet, Take 1-2 tablets by mouth every 4 (four) hours as needed for moderate pain., Disp: 30 tablet, Rfl: 0 ?   Tocilizumab (ACTEMRA ACTPEN) 162 MG/0.9ML SOAJ, Inject 162 mg into the skin once a week. , Disp: , Rfl:  ?  zolpidem (AMBIEN) 5 MG tablet, Take 5 mg by mouth at bedtime as needed for sleep. , Disp: , Rfl:  ? ?Imaging Review  ? ?Narrative ?CLINICAL DATA:  Injured back wall coughing. Low back pain for 2 ?months. ? ?EXAM: ?MRI LUMBAR SPINE WITHOUT CONTRAST ? ?TECHNIQUE: ?Multiplanar, multisequence MR imaging of the lumbar spine was ?performed. No intravenous contrast was administered. ? ?COMPARISON:  CT abdomen 02/05/2016 ? ?FINDINGS: ?Segmentation:  Standard. ? ?Alignment:  Physiologic. ? ?Vertebrae: L3 vertebral body compression fracture with approximately ?70% central height loss and with 7 mm of retropulsion of the ?superior posterior margin of the L3 vertebral body impressing upon ?the thecal sac. Mild marrow edema within the posterior aspect of the ?L3 vertebral body. Chronic L1 vertebral body compression fracture. ?No acute fracture, evidence of discitis, or bone lesion. ? ?Conus medullaris: Extends to the L1 level and appears normal. ? ?Paraspinal and other soft tissues: No paraspinal abnormality. ? ?Disc levels: ? ?Disc spaces: Degenerative disc disease with disc height loss at ?L2-3. Disc desiccation throughout the lumbar spine. ? ?T12-L1: No significant disc bulge. No evidence of neural foraminal ?stenosis. No central canal stenosis. ? ?L1-L2: Mild broad-based disc bulge. No evidence of neural foraminal ?stenosis. No central canal stenosis. ? ?L2-L3: Broad-based disc bulge. Mild bilateral facet arthropathy. ?Mild spinal stenosis. No evidence of neural foraminal stenosis. ? ?L3-L4: No significant disc bulge.  No evidence of neural foraminal ?stenosis. No central canal stenosis. ? ?L4-L5: No significant disc bulge. No evidence of neural foraminal ?stenosis. No central canal stenosis. Mild bilateral facet ?arthropathy. ? ?L5-S1: No significant disc bulge. No evidence of neural foraminal ?stenosis. No central  canal stenosis. Mild bilateral facet ?arthropathy. ? ?IMPRESSION: ?1. Subacute L3 vertebral body compression fracture with ?approximately 70% central height loss and 7 mm of retropulsion of ?the superior posterio

## 2021-12-28 DIAGNOSIS — M25511 Pain in right shoulder: Secondary | ICD-10-CM | POA: Diagnosis not present

## 2022-01-04 DIAGNOSIS — M25511 Pain in right shoulder: Secondary | ICD-10-CM | POA: Diagnosis not present

## 2022-01-04 DIAGNOSIS — G894 Chronic pain syndrome: Secondary | ICD-10-CM | POA: Diagnosis not present

## 2022-01-04 DIAGNOSIS — M546 Pain in thoracic spine: Secondary | ICD-10-CM | POA: Insufficient documentation

## 2022-01-04 DIAGNOSIS — Z79899 Other long term (current) drug therapy: Secondary | ICD-10-CM | POA: Diagnosis not present

## 2022-01-04 DIAGNOSIS — M25519 Pain in unspecified shoulder: Secondary | ICD-10-CM | POA: Insufficient documentation

## 2022-01-04 DIAGNOSIS — Z5181 Encounter for therapeutic drug level monitoring: Secondary | ICD-10-CM | POA: Diagnosis not present

## 2022-01-04 DIAGNOSIS — Z79891 Long term (current) use of opiate analgesic: Secondary | ICD-10-CM | POA: Diagnosis not present

## 2022-01-04 DIAGNOSIS — M5136 Other intervertebral disc degeneration, lumbar region: Secondary | ICD-10-CM | POA: Insufficient documentation

## 2022-01-04 DIAGNOSIS — M62830 Muscle spasm of back: Secondary | ICD-10-CM | POA: Insufficient documentation

## 2022-01-11 NOTE — Progress Notes (Signed)
MRN : [id number]  Sara [name] is a 71 y.o. (1950/09/10) female who presents with chief complaint of check circulation.  History of Present Illness:   The patient returns to the office for follow up of complaints of phantom pains involving the right lower extremity and the sensation that he she is still having excruciating pain in her right foot.  She actually feels that the discomfort/pain is intensifying with time not going away.  At the present time she is now taking 1600 mg total of gabapentin per day.  She is also taking Cymbalta.  She does note that she has been through physical therapy and has done some mirror therapy as well.  The changes she has made after her visit with Dr Holley Raring have no had much impact.  She states she could not sleep at all because of the pain just this past Tuesday   No outpatient medications have been marked as taking for the 01/12/22 encounter (Appointment) with Delana Meyer, Dolores Lory, MD.    Past Medical History:  Diagnosis Date   Arthritis    right shoulder   Depression    Dyspnea    GERD (gastroesophageal reflux disease)    History of blood clots    Hyperlipidemia    Hypertension    Mild mitral regurgitation    Osteoporosis    Peripheral vascular disease ([place])    Stomach ulcer    Vascular disease    Sees Dr. Delana Meyer   Vertigo    Last episode approx Aug 2015   Wears dentures    full upper    Past Surgical History:  Procedure Laterality Date   ADENOIDECTOMY     AMPUTATION Right 08/11/2020   Procedure: AMPUTATION ABOVE KNEE;  Surgeon: [name], MD;  Location: [facility];  Service: Vascular;  Laterality: Right;   ARTERY BIOPSY Right 07/14/2019   Procedure: BIOPSY TEMPORAL ARTERY;  Surgeon: [name], MD;  Location: [facility];  Service: Vascular;  Laterality: Right;   BREAST CYST ASPIRATION Left    CARDIAC CATHETERIZATION  02/15/2017   [facility]   COLONOSCOPY WITH PROPOFOL N/A 10/22/2017   Procedure: COLONOSCOPY WITH  PROPOFOL;  Surgeon: Lucilla Lame, MD;  Location: [place];  Service: Endoscopy;  Laterality: N/A;  specimens not taken--pt on Plavix will be brought back in after 7 days off med   COLONOSCOPY WITH PROPOFOL N/A 11/05/2017   Procedure: COLONOSCOPY WITH PROPOFOL;  Surgeon: Lucilla Lame, MD;  Location: [place];  Service: Endoscopy;  Laterality: N/A;   ESOPHAGOGASTRODUODENOSCOPY N/A 12/21/2014   Procedure: ESOPHAGOGASTRODUODENOSCOPY (EGD);  Surgeon: Lucilla Lame, MD;  Location: [place];  Service: Gastroenterology;  Laterality: N/A;   ESOPHAGOGASTRODUODENOSCOPY (EGD) WITH PROPOFOL N/A 02/08/2016   Procedure: ESOPHAGOGASTRODUODENOSCOPY (EGD) WITH PROPOFOL;  Surgeon: Lucilla Lame, MD;  Location: [facility];  Service: Endoscopy;  Laterality: N/A;   FASCIOTOMY Right 05/30/2019   Procedure: FASCIOTOMY;  Surgeon: [name], MD;  Location: [facility];  Service: Vascular;  Laterality: Right;   FASCIOTOMY CLOSURE Right 06/04/2019   Procedure: FASCIOTOMY CLOSURE;  Surgeon: [name], MD;  Location: [facility];  Service: Vascular;  Laterality: Right;   HEMORROIDECTOMY  2014   LOWER EXTREMITY ANGIOGRAPHY Right 05/30/2019   Procedure: LOWER EXTREMITY ANGIOGRAPHY;  Surgeon: [name], MD;  Location: [place] [facility];  Service: Cardiovascular;  Laterality: Right;   LOWER EXTREMITY ANGIOGRAPHY Right 07/02/2020   Procedure: LOWER EXTREMITY ANGIOGRAPHY;  Surgeon: [name], MD;  Location:  [place] [facility];  Service: Cardiovascular;  Laterality: Right;   PERIPHERAL VASCULAR CATHETERIZATION N/A 02/09/2016   Procedure: Abdominal Aortogram w/Lower Extremity;  Surgeon: [name], MD;  Location: [place] [facility];  Service: Cardiovascular;  Laterality: N/A;   PERIPHERAL VASCULAR CATHETERIZATION Right 02/10/2016   Procedure: Lower Extremity Angiography;  Surgeon: [name], MD;  Location: [facility] [facility];  Service: Cardiovascular;   Laterality: Right;   POLYPECTOMY  11/05/2017   Procedure: POLYPECTOMY;  Surgeon: Lucilla Lame, MD;  Location: [place];  Service: Endoscopy;;   SHOULDER ARTHROSCOPY WITH ROTATOR CUFF REPAIR AND SUBACROMIAL DECOMPRESSION Right 02/27/2020   Procedure: RIGHT SHOULDER ARTHROSCOPY SUBACROMIAL DECOMPRESSION, DISTAL CLAVICLE EXCISION AND MINI-OPEN ROTATOR CUFF REPAIR;  Surgeon: Thornton Park, MD;  Location: [facility];  Service: Orthopedics;  Laterality: Right;   TONSILLECTOMY     VASCULAR SURGERY  1027,2536   Fem-Pop Bypass    Social History Social History   Tobacco Use   Smoking status: Former    Packs/day: 2.00    Years: 25.00    Pack years: 50.00    Types: Cigarettes    Quit date: 08/22/1991    Years since quitting: 30.4   Smokeless tobacco: Never  Vaping Use   Vaping Use: Never used  Substance Use Topics   Alcohol use: No    Alcohol/week: 0.0 standard drinks   Drug use: Never    Family History Family History  Problem Relation Age of Onset   CVA Mother    Heart attack Mother    Cancer Father        colon cancer   Colon cancer Father    Heart disease Maternal Uncle    Breast cancer Neg Hx     Allergies  Allergen Reactions   Hydrocodone-Acetaminophen     Other reaction(s): Flushing Other reaction(s): Flushing   Amoxicillin Other (See Comments)    Yeast infection   Metoprolol Tartrate Rash   Oxycodone Itching   Vicodin [Hydrocodone-Acetaminophen] Hives and Rash    Flushing     REVIEW OF SYSTEMS (Negative unless checked)  Constitutional: '[]'$ Weight loss  '[]'$ Fever  '[]'$ Chills Cardiac: '[]'$ Chest pain   '[]'$ Chest pressure   '[]'$ Palpitations   '[]'$ Shortness of breath when laying flat   '[]'$ Shortness of breath with exertion. Vascular:  '[x]'$ Pain in legs with walking   '[]'$ Pain in legs at rest  '[]'$ History of DVT   '[]'$ Phlebitis   '[]'$ Swelling in legs   '[]'$ Varicose veins   '[]'$ Non-healing ulcers Pulmonary:   '[]'$ Uses home oxygen   '[]'$ Productive cough   '[]'$ Hemoptysis   '[]'$ Wheeze  '[]'$ COPD    '[]'$ Asthma Neurologic:  '[]'$ Dizziness   '[]'$ Seizures   '[]'$ History of stroke   '[]'$ History of TIA  '[]'$ Aphasia   '[]'$ Vissual changes   '[]'$ Weakness or numbness in arm   '[]'$ Weakness or numbness in leg Musculoskeletal:   '[]'$ Joint swelling   '[]'$ Joint pain   '[]'$ Low back pain Hematologic:  '[]'$ Easy bruising  '[]'$ Easy bleeding   '[]'$ Hypercoagulable state   '[]'$ Anemic Gastrointestinal:  '[]'$ Diarrhea   '[]'$ Vomiting  '[]'$ Gastroesophageal reflux/heartburn   '[]'$ Difficulty swallowing. Genitourinary:  '[]'$ Chronic kidney disease   '[]'$ Difficult urination  '[]'$ Frequent urination   '[]'$ Blood in urine Skin:  '[]'$ Rashes   '[]'$ Ulcers  Psychological:  '[]'$ History of anxiety   '[]'$  History of major depression.  Physical Examination  There were no vitals filed for this visit. There is no height or weight on file to calculate BMI. Gen: WD/WN, NAD Head: [place]/AT, No temporalis wasting.  Ear/Nose/Throat: Hearing grossly intact, nares w/o erythema or  drainage Eyes: PER, EOMI, sclera nonicteric.  Neck: Supple, no masses.  No bruit or JVD.  Pulmonary:  Good air movement, no audible wheezing, no use of accessory muscles.  Cardiac: RRR, normal S1, S2, no Murmurs. Vascular:  mild trophic changes, no open wounds Vessel Right Left  Radial Palpable Palpable  PT AKA Not Palpable  DP AKA Not Palpable  Gastrointestinal: soft, non-distended. No guarding/no peritoneal signs.  Musculoskeletal: M/S 5/5 throughout.  No visible deformity.  Neurologic: CN 2-12 intact. Pain and light touch intact in extremities.  Symmetrical.  Speech is fluent. Motor exam as listed above. Psychiatric: Judgment intact, Mood & affect appropriate for pt's clinical situation. Dermatologic: No rashes or ulcers noted.  No changes consistent with cellulitis.   CBC Lab Results  Component Value Date   WBC 5.3 06/24/2021   HGB 13.7 06/24/2021   HCT 42.1 06/24/2021   MCV 97.5 06/24/2021   PLT 163 06/24/2021    BMET    Component Value Date/Time   NA 136 06/24/2021 1139   NA 139 09/21/2015 0916    K 3.8 06/24/2021 1139   CL 102 06/24/2021 1139   CO2 26 06/24/2021 1139   GLUCOSE 105 (H) 06/24/2021 1139   BUN 15 06/24/2021 1139   BUN 12 09/21/2015 0916   CREATININE 0.80 06/24/2021 1139   CREATININE 0.88 05/16/2016 1034   CALCIUM 8.7 (L) 06/24/2021 1139   GFRNONAA >60 06/24/2021 1139   GFRNONAA 66 05/01/2016 0846   GFRAA >60 02/18/2020 0949   GFRAA 76 05/01/2016 0846   CrCl cannot be calculated (Patient's most recent lab result is older than the maximum 21 days allowed.).  COAG Lab Results  Component Value Date   INR 1.0 08/09/2020   INR 1.3 (H) 02/18/2020   INR 1.0 07/11/2019    Radiology No results found.   Assessment/Plan 1. S/P AKA (above knee amputation), right ([place]) Continue with Dr Holley Raring  No issues with the prosthesis  2. PAD (peripheral artery disease) (HCC)  Recommend:  The patient has evidence of atherosclerosis of the lower extremities with claudication.  The patient does not voice lifestyle limiting changes at this point in time.  Noninvasive studies do not suggest clinically significant change.  No invasive studies, angiography or surgery at this time The patient should continue walking and begin a more formal exercise program.  The patient should continue antiplatelet therapy and aggressive treatment of the lipid abnormalities  No changes in the patient's medications at this time  Continued surveillance is indicated as atherosclerosis is likely to progress with time.    The patient will continue follow up with noninvasive studies as ordered.    3. Bilateral carotid artery stenosis Recommend:   Given the patient's asymptomatic subcritical stenosis no further invasive testing or surgery at this time.    Continue antiplatelet therapy as prescribed Continue management of CAD, HTN and Hyperlipidemia Healthy heart diet,  encouraged exercise at least 4 times per week.   Follow up in 12 months with duplex ultrasound and physical exam    4.  Essential hypertension Continue antihypertensive medications as already ordered, these medications have been reviewed and there are no changes at this time.   5. Coronary artery disease involving native coronary artery of native heart with angina pectoris ([place]) Continue cardiac and antihypertensive medications as already ordered and reviewed, no changes at this time.  Continue statin as ordered and reviewed, no changes at this time  Nitrates PRN for chest pain     [name],  MD  01/11/2022 8:46 PM

## 2022-01-12 ENCOUNTER — Encounter (INDEPENDENT_AMBULATORY_CARE_PROVIDER_SITE_OTHER): Payer: Self-pay | Admitting: Vascular Surgery

## 2022-01-12 ENCOUNTER — Ambulatory Visit (INDEPENDENT_AMBULATORY_CARE_PROVIDER_SITE_OTHER): Payer: Medicare Other | Admitting: Vascular Surgery

## 2022-01-12 VITALS — BP 133/74 | HR 85 | Resp 16 | Wt 130.8 lb

## 2022-01-12 DIAGNOSIS — Z89611 Acquired absence of right leg above knee: Secondary | ICD-10-CM

## 2022-01-12 DIAGNOSIS — I779 Disorder of arteries and arterioles, unspecified: Secondary | ICD-10-CM

## 2022-01-12 DIAGNOSIS — I25119 Atherosclerotic heart disease of native coronary artery with unspecified angina pectoris: Secondary | ICD-10-CM | POA: Diagnosis not present

## 2022-01-12 DIAGNOSIS — I10 Essential (primary) hypertension: Secondary | ICD-10-CM | POA: Diagnosis not present

## 2022-01-12 DIAGNOSIS — I351 Nonrheumatic aortic (valve) insufficiency: Secondary | ICD-10-CM | POA: Diagnosis not present

## 2022-01-12 DIAGNOSIS — E785 Hyperlipidemia, unspecified: Secondary | ICD-10-CM | POA: Diagnosis not present

## 2022-01-12 DIAGNOSIS — I340 Nonrheumatic mitral (valve) insufficiency: Secondary | ICD-10-CM | POA: Diagnosis not present

## 2022-01-12 DIAGNOSIS — I2510 Atherosclerotic heart disease of native coronary artery without angina pectoris: Secondary | ICD-10-CM | POA: Diagnosis not present

## 2022-01-12 DIAGNOSIS — I739 Peripheral vascular disease, unspecified: Secondary | ICD-10-CM

## 2022-01-12 DIAGNOSIS — I6523 Occlusion and stenosis of bilateral carotid arteries: Secondary | ICD-10-CM

## 2022-01-12 DIAGNOSIS — I471 Supraventricular tachycardia: Secondary | ICD-10-CM | POA: Diagnosis not present

## 2022-01-14 ENCOUNTER — Encounter (INDEPENDENT_AMBULATORY_CARE_PROVIDER_SITE_OTHER): Payer: Self-pay | Admitting: Vascular Surgery

## 2022-01-24 DIAGNOSIS — Z7983 Long term (current) use of bisphosphonates: Secondary | ICD-10-CM | POA: Diagnosis not present

## 2022-01-24 DIAGNOSIS — Z13820 Encounter for screening for osteoporosis: Secondary | ICD-10-CM | POA: Diagnosis not present

## 2022-01-26 DIAGNOSIS — I2510 Atherosclerotic heart disease of native coronary artery without angina pectoris: Secondary | ICD-10-CM | POA: Diagnosis not present

## 2022-01-26 DIAGNOSIS — I739 Peripheral vascular disease, unspecified: Secondary | ICD-10-CM | POA: Diagnosis not present

## 2022-01-26 DIAGNOSIS — I351 Nonrheumatic aortic (valve) insufficiency: Secondary | ICD-10-CM | POA: Diagnosis not present

## 2022-01-26 DIAGNOSIS — E785 Hyperlipidemia, unspecified: Secondary | ICD-10-CM | POA: Diagnosis not present

## 2022-01-26 DIAGNOSIS — I340 Nonrheumatic mitral (valve) insufficiency: Secondary | ICD-10-CM | POA: Diagnosis not present

## 2022-01-26 DIAGNOSIS — I10 Essential (primary) hypertension: Secondary | ICD-10-CM | POA: Diagnosis not present

## 2022-02-07 ENCOUNTER — Encounter: Payer: Medicare Other | Admitting: Student in an Organized Health Care Education/Training Program

## 2022-02-10 DIAGNOSIS — R7303 Prediabetes: Secondary | ICD-10-CM | POA: Diagnosis not present

## 2022-02-10 DIAGNOSIS — I10 Essential (primary) hypertension: Secondary | ICD-10-CM | POA: Diagnosis not present

## 2022-02-10 DIAGNOSIS — D509 Iron deficiency anemia, unspecified: Secondary | ICD-10-CM | POA: Diagnosis not present

## 2022-02-10 DIAGNOSIS — S29012A Strain of muscle and tendon of back wall of thorax, initial encounter: Secondary | ICD-10-CM | POA: Diagnosis not present

## 2022-02-10 DIAGNOSIS — E785 Hyperlipidemia, unspecified: Secondary | ICD-10-CM | POA: Diagnosis not present

## 2022-02-13 DIAGNOSIS — R7303 Prediabetes: Secondary | ICD-10-CM | POA: Diagnosis not present

## 2022-02-13 DIAGNOSIS — M810 Age-related osteoporosis without current pathological fracture: Secondary | ICD-10-CM | POA: Diagnosis not present

## 2022-02-13 DIAGNOSIS — R945 Abnormal results of liver function studies: Secondary | ICD-10-CM | POA: Diagnosis not present

## 2022-02-13 DIAGNOSIS — G4700 Insomnia, unspecified: Secondary | ICD-10-CM | POA: Diagnosis not present

## 2022-02-13 DIAGNOSIS — K219 Gastro-esophageal reflux disease without esophagitis: Secondary | ICD-10-CM | POA: Diagnosis not present

## 2022-02-14 ENCOUNTER — Telehealth: Payer: Self-pay

## 2022-02-14 ENCOUNTER — Other Ambulatory Visit: Payer: Self-pay | Admitting: Student in an Organized Health Care Education/Training Program

## 2022-02-14 ENCOUNTER — Ambulatory Visit: Payer: Medicare Other | Attending: Orthopedic Surgery

## 2022-02-14 DIAGNOSIS — M6281 Muscle weakness (generalized): Secondary | ICD-10-CM | POA: Diagnosis not present

## 2022-02-14 DIAGNOSIS — M316 Other giant cell arteritis: Secondary | ICD-10-CM | POA: Diagnosis not present

## 2022-02-14 DIAGNOSIS — M5459 Other low back pain: Secondary | ICD-10-CM | POA: Insufficient documentation

## 2022-02-14 DIAGNOSIS — M25511 Pain in right shoulder: Secondary | ICD-10-CM | POA: Insufficient documentation

## 2022-02-14 DIAGNOSIS — G8929 Other chronic pain: Secondary | ICD-10-CM | POA: Diagnosis not present

## 2022-02-14 DIAGNOSIS — Z79899 Other long term (current) drug therapy: Secondary | ICD-10-CM | POA: Diagnosis not present

## 2022-02-14 NOTE — Therapy (Unsigned)
OUTPATIENT PHYSICAL THERAPY SHOULDER EVALUATION   Patient Name: Sara [name] MRN: [id number] DOB:[birth year], 71 y.o., female Today's Date: 02/16/2022   PT End of Session - 02/16/22 1033     Visit Number 1    Number of Visits 25    Date for PT Re-Evaluation 05/09/22    Authorization Type eval: 02/14/22    PT Start Time 1315    PT Stop Time 1400    PT Time Calculation (min) 45 min    Activity Tolerance Patient tolerated treatment well    Behavior During Therapy [facility] for tasks assessed/performed             Past Medical History:  Diagnosis Date   Arthritis    right shoulder   Depression    Dyspnea    GERD (gastroesophageal reflux disease)    History of blood clots    Hyperlipidemia    Hypertension    Mild mitral regurgitation    Osteoporosis    Peripheral vascular disease ([place])    Stomach ulcer    Vascular disease    Sees Dr. Delana Meyer   Vertigo    Last episode approx Aug 2015   Wears dentures    full upper   Past Surgical History:  Procedure Laterality Date   ADENOIDECTOMY     AMPUTATION Right 08/11/2020   Procedure: AMPUTATION ABOVE KNEE;  Surgeon: [name], MD;  Location: [facility];  Service: Vascular;  Laterality: Right;   ARTERY BIOPSY Right 07/14/2019   Procedure: BIOPSY TEMPORAL ARTERY;  Surgeon: [name], MD;  Location: [facility];  Service: Vascular;  Laterality: Right;   BREAST CYST ASPIRATION Left    CARDIAC CATHETERIZATION  02/15/2017   [facility]   COLONOSCOPY WITH PROPOFOL N/A 10/22/2017   Procedure: COLONOSCOPY WITH PROPOFOL;  Surgeon: Lucilla Lame, MD;  Location: [place];  Service: Endoscopy;  Laterality: N/A;  specimens not taken--pt on Plavix will be brought back in after 7 days off med   COLONOSCOPY WITH PROPOFOL N/A 11/05/2017   Procedure: COLONOSCOPY WITH PROPOFOL;  Surgeon: Lucilla Lame, MD;  Location: [place];  Service: Endoscopy;  Laterality: N/A;   ESOPHAGOGASTRODUODENOSCOPY N/A 12/21/2014   Procedure:  ESOPHAGOGASTRODUODENOSCOPY (EGD);  Surgeon: Lucilla Lame, MD;  Location: [place];  Service: Gastroenterology;  Laterality: N/A;   ESOPHAGOGASTRODUODENOSCOPY (EGD) WITH PROPOFOL N/A 02/08/2016   Procedure: ESOPHAGOGASTRODUODENOSCOPY (EGD) WITH PROPOFOL;  Surgeon: Lucilla Lame, MD;  Location: [facility];  Service: Endoscopy;  Laterality: N/A;   FASCIOTOMY Right 05/30/2019   Procedure: FASCIOTOMY;  Surgeon: [name], MD;  Location: [facility];  Service: Vascular;  Laterality: Right;   FASCIOTOMY CLOSURE Right 06/04/2019   Procedure: FASCIOTOMY CLOSURE;  Surgeon: [name], MD;  Location: [facility];  Service: Vascular;  Laterality: Right;   HEMORROIDECTOMY  2014   LOWER EXTREMITY ANGIOGRAPHY Right 05/30/2019   Procedure: LOWER EXTREMITY ANGIOGRAPHY;  Surgeon: [name], MD;  Location: [place] [facility];  Service: Cardiovascular;  Laterality: Right;   LOWER EXTREMITY ANGIOGRAPHY Right 07/02/2020   Procedure: LOWER EXTREMITY ANGIOGRAPHY;  Surgeon: [name], MD;  Location: [place] [facility];  Service: Cardiovascular;  Laterality: Right;   PERIPHERAL VASCULAR CATHETERIZATION N/A 02/09/2016   Procedure: Abdominal Aortogram w/Lower Extremity;  Surgeon: [name], MD;  Location: [place] [facility];  Service: Cardiovascular;  Laterality: N/A;   PERIPHERAL VASCULAR CATHETERIZATION Right 02/10/2016   Procedure: Lower Extremity Angiography;  Surgeon: [name], MD;  Location: [facility];  Service: Cardiovascular;  Laterality: Right;   POLYPECTOMY  11/05/2017   Procedure: POLYPECTOMY;  Surgeon: Lucilla Lame, MD;  Location: [place];  Service: Endoscopy;;   SHOULDER ARTHROSCOPY WITH ROTATOR CUFF REPAIR AND SUBACROMIAL DECOMPRESSION Right 02/27/2020   Procedure: RIGHT SHOULDER ARTHROSCOPY SUBACROMIAL DECOMPRESSION, DISTAL CLAVICLE EXCISION AND MINI-OPEN ROTATOR CUFF REPAIR;  Surgeon: Thornton [place], MD;  Location: [facility];   Service: Orthopedics;  Laterality: Right;   TONSILLECTOMY     VASCULAR SURGERY  [id number]   Fem-Pop Bypass   Patient Active Problem List   Diagnosis Date Noted   Degeneration of lumbar intervertebral disc 01/04/2022   Shoulder pain 01/04/2022   Pain in joint of right shoulder 01/04/2022   Spasm of back muscles 01/04/2022   Thoracic back pain 01/04/2022   History of left above knee amputation [facility]) (Dec 2021) 12/27/2021   Rotator cuff arthropathy of right shoulder 12/27/2021   Hx of rotator cuff surgery 12/27/2021   Chronic pain syndrome 12/27/2021   Adhesive capsulitis of right shoulder 12/05/2021   Phantom pain ([place]) 11/10/2021   Muscle spasm 09/09/2020   S/P AKA (above knee amputation), right ([place]) 09/08/2020   Ischemia of extremity 07/02/2020   Chronic pain in right shoulder 11/25/2019   Encounter for long-term (current) use of high-risk medication 07/16/2019   GCA (giant cell arteritis) ([place]) 07/16/2019   Temporal arteritis ([place]) 07/13/2019   Postoperative wound infection 06/23/2019   Ischemic leg 05/31/2019   Atherosclerosis of native arteries of extremity with intermittent claudication ([place]) 05/30/2019   Depression 05/29/2019   Eczema of lower extremity 03/04/2018   Chronic venous insufficiency 03/02/2018   Personal history of colonic polyps    Family history of colonic polyps    Benign neoplasm of ascending colon    Mitral valve insufficiency 02/08/2017   Dyspnea on exertion 02/07/2017   Non-rheumatic mitral regurgitation 02/07/2017   Precordial pain 02/07/2017   CAD (coronary artery disease) 12/21/2016   Essential hypertension 12/21/2016   Compression fracture of lumbar spine, non-traumatic ([place]) 04/26/2016   Compression fracture of L3 lumbar vertebra 04/20/2016   PVD (peripheral vascular disease) ([place]) 02/24/2016   Family history of premature CAD 02/24/2016   Gastritis    Other specified diseases of esophagus    Hiatal hernia    Gastritis and gastroduodenitis     Ischemia of lower extremity    Arterial occlusion ([place])    Atherosclerosis of aorta ([place])    History of smoking    Hyperlipidemia    Pain in the chest    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 02/05/2016   Carotid artery stenosis 01/04/2016   Vertigo, benign paroxysmal 12/28/2015   Clinical depression 09/06/2015   History of alcoholism ([place]) 09/06/2015   Angiopathy, 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[place]) 09/06/2015   Atherosclerosis of native arteries of extremity with rest pain ([place]) 09/06/2015   Acute non-recurrent maxillary sinusitis 07/14/2015   Vaginal pruritus 05/26/2015   Chronic recurrent major depressive disorder ([place]) 03/09/2015   Osteoporosis, post-menopausal 03/09/2015   Peripheral blood vessel disorder ([place]) 03/09/2015   Acid reflux 03/09/2015   GERD (gastroesophageal reflux disease) 12/21/2014   Carotid artery narrowing 01/28/2014   Peripheral arterial occlusive disease ([place]) 06/08/2011   Occlusion and stenosis of unspecified carotid artery 06/08/2011   PAD (peripheral artery disease) ([place]) 06/08/2011    PCP: Danelle Berry, NP  REFERRING PROVIDER: Bar[place] Lyme MD  REFERRING DIAGNOSIS: S29.012A (ICD-10-CM) - Strain of muscle and tendon of back wall of thorax, initial encounter  THERAPY DIAG: Other low back pain  Chronic right shoulder pain - Plan: PT plan of care cert/re-cert  RATIONALE FOR EVALUATION AND TREATMENT: Rehabilitation  ONSET DATE: 08/11/20 (approximate)   SUBJECTIVE:                                                                                                                                                                                         Chief Complaint: R thoracic/rib pain  Pertinent History Pt reports she she started experiencing R shoulder pain which started shortly after her RLE amputation (08/11/20). She attributes the pain to having to self-propel a wheelchair for an extended period of time. She has seen Dr. Mack Guise  at [facility] in the past and underwent a R shoulder mini-open rotator cuff repair, DCE, and SAD on 02/27/20. Since the pain started she has undergone R shoulder injections without improvement in her pain. When pt describes her pain she points to the area below her R shoulder blade around T10/T11. She denies weakness in the RUE or any pain in the area of the [facility] joint or shoulder pain. Denies N/T in back or RUE. Pain worsens with extended use of RUE especially when cleaning the counters in her apartment or sweeping the floor. She has a history of L1 and L3 compression fractures in 2017. She currently sees pain management for her R shoulder pain and takes oxycodone to manage the pain. She also struggles with chronic RLE phantom pain s/p amputation and takes gabapentin for this issue. She has a history of multiple vascular surgeries prior to her amputation and also has a history of carpal tunnel syndrome.  Pain:  Pain Intensity: Not rated today Pain location: R back/flank inferior to the shoulder blade Pain Quality: sharp  Radiating: No  Numbness/Tingling: No Focal Weakness: No Aggravating factors: Excessive use of RUE, mopping the floors, wiping countertops, no pain with driving, Relieving factors: heating pad, never tried lidocaine patch, oxycodone, gabapentin doesn't help with R flank/back pain; 24-hour pain behavior: varies depending on activity History of prior shoulder or neck/shoulder injury, pain, surgery, or therapy: Yes Falls: Has patient fallen in last 6 months? 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  Recent memory is intact.  Remote memory is intact.  Attention span and concentration are intact.  Expressive speech is intact.  Patient's fund of knowledge is within normal limits for educational level.    Gross Musculoskeletal Assessment Tremor: None Bulk: Normal Tone: Normal  Gait Pt ambulates with RLE prosthesis and lofstrand in LUE, decreased self-selected gait speed;  Posture Increased thoracic kyphosis, forward head, and rounded shoulder in sitting and standing;  Cervical Screen AROM: WFL and painless with overpressure in all planes Spurlings A (ipsilateral lateral flexion/axial compression): R: Negative L: Negative Spurlings B (ipsilateral lateral flexion/contralateral rotation/axial compression): R: Negative L: Negative Repeated movement: Not examined Hoffman Sign (cervical cord compression): R: Not examined L: Not examined ULTT Median: R: Not examined L: Not examined ULTT Ulnar: R: Not examined L: Not examined ULTT Radial: R: Not examined L: Not examined   AROM Cervical AROM is painless in all directions and WFL although not full. No reproduction of pain with AROM R shoulder flexion and abduction which is also limited but WFL. No gross deficits noted. Pt denies pain with thoracic flexion, extension, rotation, and lateral flexion.    LE MMT:  MMT (out of 5) Right 02/16/2022 Left 02/16/2022  Cervical (isometric)  Flexion WNL  Extension WNL  Lateral Flexion WNL WNL  Rotation WNL WNL      Shoulder   Flexion 4+ 4+  Extension    Abduction 4+ 4+  External rotation (seated) 4+ 4+   Internal rotation (seated) 4+ 4+  Horizontal abduction    Horizontal adduction    Lower Trapezius    Rhomboids        Elbow  Flexion 5 5  Extension 5 5  Pronation    Supination        Wrist  Flexion 5 5  Extension 5 5  Radial deviation    Ulnar deviation        MCP  Flexion    Extension    Abduction 5 5  Adduction 5 5  (* = pain; Blank rows = not tested)  Sensation Grossly intact to light touch bilateral UE as determined by testing dermatomes C2-T2. Proprioception and hot/cold testing deferred on this date.  Reflexes Deferred   Palpation Pt is nontender to palpation around entire R shoulder girdle anterior, lateral, and posterior. No pain with palpation to R cervical paraspinals, upper trap, rhomboids, or mid trap. She has pain to palpation along spinous processes from T9-L3 as well as along paraspinals in this region on the right.   Repeated Movements Deferred  Passive Accessory Intervertebral Motion Pt reports reproduction of pain with CPA and bilateral UPA T9-L3. Unable to fully assess mobility secondary to pain however appear grossly hypomobile with notable thoracic kyphosis when prone.   SPECIAL TESTS Rotator Cuff  Drop Arm Test: Negative Painful Arc (Pain from 60 to 120 degrees scaption): Negative Infraspinatus Muscle Test: Negative   Subacromial Impingement Hawkins-Kennedy: Negative Neer (Block scapula, PROM flexion): Negative Painful Arc (Pain from 60 to 120 degrees scaption): Negative Empty Can: Negative External Rotation Resistance: Negative Horizontal Adduction: Not examined Scapular Assist: Not examined   Labral Tear Biceps Load II (120 elevation, full ER, 90 elbow flexion, full supination, resisted elbow flexion): Not examined Crank (160 scaption, axial load with IR/ER): Not examined Active Compression  Test: Not examined  Bicep Tendon Pathology Speed (shoulder flexion to 90, external rotation, full elbow extension, and forearm  supination with resistance: Not examined Yergason's (resisted shoulder ER and supination/biceps tendon pathology): Not examined  Shoulder Instability Sulcus Sign: Not examined Anterior Apprehension: Not examined  Beighton scale: Deferred   Outcome Measures Quick DASH: Deferred FOTO: Pt completed shoulder FOTO but this appears to be more related to back pain. Will update at next therapy session.    TODAY'S TREATMENT  None   PATIENT EDUCATION:  Education details: Plan of care Person educated: Patient Education method: Explanation Education comprehension: verbalized understanding   HOME EXERCISE PROGRAM: None currently   ASSESSMENT:  CLINICAL IMPRESSION: Patient is a 71 y.o. female who was seen today for physical therapy evaluation and treatment for R shoulder pain. During history and examination pain actually appears to be more related to R lower thoracic/upper lumbar back and R sided ribs/flank. Pt is nontender to palpation around entire R shoulder girdle anterior, lateral, and posterior. No pain with palpation to R cervical paraspinals, upper trap, rhomboids, or mid trap. She has pain to palpation along spinous processes from T9-L3 as well as along paraspinals in this region on the right. All strength testing and ROM of R GH joint/shoulder girdle are full and painless. All R shoulder special testing is negative. Objective impairments include Abnormal gait, decreased ROM, postural dysfunction, and pain. These impairments are limiting patient from cleaning, shopping, and community activity. Personal factors including Age, Past/current experiences, Time since onset of injury/illness/exacerbation, and 3+ comorbidities: RLE amputation, PAD, chronic pain, depression, s/p R RTC repair, and osteoporosis  are also affecting patient's functional outcome. Patient will benefit from skilled PT to address above impairments and improve overall function.  REHAB POTENTIAL: Fair    CLINICAL  DECISION MAKING: Unstable/unpredictable  EVALUATION COMPLEXITY: High   GOALS: Goals reviewed with patient? Yes  SHORT TERM GOALS: Target date:  03/14/2022  Pt will be independent with HEP to improve strength and decrease back pain to improve pain-free function at home. Baseline:  Goal status: INITIAL   LONG TERM GOALS: Target date: 04/11/2022  Pt will increase FOTO to at least predicted improvement value in order to demonstrate significant improvement in function at home and work related to back pain  Baseline: 02/14/22: To be completed at next visit Goal status: INITIAL  2.  Pt will decrease worst back pain by at least 2 points on the NPRS in order to demonstrate clinically significant reduction in back pain. Baseline: 02/14/22: To be completed at next visit Goal status: INITIAL  3.  Pt will report at least 50% improvement in her back symptoms in order to demonstrate clinically significant reduction in disability related to back pain;        Baseline:  Goal status: INITIAL    PLAN: PT FREQUENCY: 1-2x/week  PT DURATION: 8 weeks  PLANNED INTERVENTIONS: Therapeutic exercises, Therapeutic activity, Neuromuscular re-education, Balance training, Gait training, Patient/Family education, Joint manipulation, Joint mobilization, Vestibular training, Canalith repositioning, Aquatic Therapy, Dry Needling, Electrical stimulation, Spinal manipulation, Spinal mobilization, Cryotherapy, Moist heat, Traction, Ultrasound, Ionotophoresis '4mg'$ /ml Dexamethasone, and Manual therapy  PLAN FOR NEXT SESSION: Back FOTO, initiate manual techniques for R sided back pain, consider TDN, initiate stretching/strengthening  Lyndel Safe Char Feltman PT, DPT, GCS  Janssen Zee 02/16/2022, 10:53 AM

## 2022-02-16 ENCOUNTER — Other Ambulatory Visit: Payer: Self-pay | Admitting: Student in an Organized Health Care Education/Training Program

## 2022-02-16 ENCOUNTER — Encounter: Payer: Self-pay | Admitting: Student in an Organized Health Care Education/Training Program

## 2022-02-16 ENCOUNTER — Ambulatory Visit (INDEPENDENT_AMBULATORY_CARE_PROVIDER_SITE_OTHER): Payer: Medicare Other | Admitting: Vascular Surgery

## 2022-02-16 ENCOUNTER — Encounter (INDEPENDENT_AMBULATORY_CARE_PROVIDER_SITE_OTHER): Payer: Self-pay | Admitting: Vascular Surgery

## 2022-02-16 ENCOUNTER — Ambulatory Visit: Payer: Medicare Other

## 2022-02-16 ENCOUNTER — Ambulatory Visit
Payer: Medicare Other | Attending: Student in an Organized Health Care Education/Training Program | Admitting: Student in an Organized Health Care Education/Training Program

## 2022-02-16 VITALS — BP 125/71 | HR 78 | Resp 17 | Ht 63.0 in | Wt 130.0 lb

## 2022-02-16 VITALS — BP 122/68 | HR 86 | Temp 97.0°F | Resp 16 | Ht 63.0 in | Wt 123.0 lb

## 2022-02-16 DIAGNOSIS — M545 Low back pain, unspecified: Secondary | ICD-10-CM

## 2022-02-16 DIAGNOSIS — I739 Peripheral vascular disease, unspecified: Secondary | ICD-10-CM | POA: Insufficient documentation

## 2022-02-16 DIAGNOSIS — I6521 Occlusion and stenosis of right carotid artery: Secondary | ICD-10-CM | POA: Diagnosis not present

## 2022-02-16 DIAGNOSIS — I70221 Atherosclerosis of native arteries of extremities with rest pain, right leg: Secondary | ICD-10-CM | POA: Diagnosis not present

## 2022-02-16 DIAGNOSIS — S22000S Wedge compression fracture of unspecified thoracic vertebra, sequela: Secondary | ICD-10-CM

## 2022-02-16 DIAGNOSIS — M6281 Muscle weakness (generalized): Secondary | ICD-10-CM | POA: Diagnosis not present

## 2022-02-16 DIAGNOSIS — I779 Disorder of arteries and arterioles, unspecified: Secondary | ICD-10-CM | POA: Diagnosis not present

## 2022-02-16 DIAGNOSIS — G894 Chronic pain syndrome: Secondary | ICD-10-CM | POA: Diagnosis not present

## 2022-02-16 DIAGNOSIS — G546 Phantom limb syndrome with pain: Secondary | ICD-10-CM | POA: Insufficient documentation

## 2022-02-16 DIAGNOSIS — Z9889 Other specified postprocedural states: Secondary | ICD-10-CM | POA: Diagnosis not present

## 2022-02-16 DIAGNOSIS — M12811 Other specific arthropathies, not elsewhere classified, right shoulder: Secondary | ICD-10-CM | POA: Insufficient documentation

## 2022-02-16 DIAGNOSIS — Z89612 Acquired absence of left leg above knee: Secondary | ICD-10-CM | POA: Insufficient documentation

## 2022-02-16 DIAGNOSIS — G8929 Other chronic pain: Secondary | ICD-10-CM | POA: Diagnosis not present

## 2022-02-16 DIAGNOSIS — M5459 Other low back pain: Secondary | ICD-10-CM

## 2022-02-16 DIAGNOSIS — M25511 Pain in right shoulder: Secondary | ICD-10-CM | POA: Diagnosis not present

## 2022-02-16 MED ORDER — GABAPENTIN 800 MG PO TABS: 800.0000 mg | ORAL_TABLET | Freq: Three times a day (TID) | ORAL | 3 refills | Status: DC

## 2022-02-16 NOTE — Progress Notes (Signed)
MRN : [id number]  Sara [name] is a 71 y.o. (10/19/1950) female who presents with chief complaint of check circulation.  History of Present Illness:   The patient returns to the office for follow up of complaints of phantom pains involving the right lower extremity and the sensation that he she is still having excruciating pain in her right foot.  She actually feels that the discomfort/pain is better.  She saw Dr Holley Raring earlier today and he adjusted her gabapentin.  At the present time she is now taking 800 mg total of gabapentin tid.  She is also taking Cymbalta.      Current Meds  Medication Sig   alendronate (FOSAMAX) 10 MG tablet Take 10 mg by mouth daily before breakfast. Take with a full glass of water on an empty stomach.   apixaban (ELIQUIS) 5 MG TABS tablet Take 1 tablet (5 mg total) by mouth 2 (two) times daily.   atorvastatin (LIPITOR) 40 MG tablet Take 40 mg by mouth at bedtime.    calcium carbonate (OSCAL) 1500 (600 Ca) MG TABS tablet Take 1,200 mg of elemental calcium by mouth 2 (two) times daily with a meal.   cholecalciferol (VITAMIN D3) 25 MCG (1000 UNIT) tablet Take 1,000 Units by mouth daily.   DULoxetine (CYMBALTA) 60 MG capsule Take 60 mg by mouth every morning.   gabapentin (NEURONTIN) 800 MG tablet Take 1 tablet (800 mg total) by mouth 3 (three) times daily.   losartan (COZAAR) 25 MG tablet Take 25 mg by mouth every morning.    oxyCODONE-acetaminophen (PERCOCET/ROXICET) 5-325 MG tablet Take 1-2 tablets by mouth every 4 (four) hours as needed for moderate pain.   Tocilizumab (ACTEMRA ACTPEN) 162 MG/0.9ML SOAJ Inject 162 mg into the skin once a week.    zolpidem (AMBIEN) 5 MG tablet Take 5 mg by mouth at bedtime as needed for sleep.     Past Medical History:  Diagnosis Date   Arthritis    right shoulder   Depression    Dyspnea    GERD (gastroesophageal reflux disease)    History of blood clots    Hyperlipidemia    Hypertension     Mild mitral regurgitation    Osteoporosis    Peripheral vascular disease (HCC)    Stomach ulcer    Vascular disease    Sees Dr. Delana Meyer   Vertigo    Last episode approx Aug 2015   Wears dentures    full upper    Past Surgical History:  Procedure Laterality Date   ADENOIDECTOMY     AMPUTATION Right 08/11/2020   Procedure: AMPUTATION ABOVE KNEE;  Surgeon: [name], MD;  Location: [facility];  Service: Vascular;  Laterality: Right;   ARTERY BIOPSY Right 07/14/2019   Procedure: BIOPSY TEMPORAL ARTERY;  Surgeon: [name], MD;  Location: [facility];  Service: Vascular;  Laterality: Right;   BREAST CYST ASPIRATION Left    CARDIAC CATHETERIZATION  02/15/2017   [facility]   COLONOSCOPY WITH PROPOFOL N/A 10/22/2017   Procedure: COLONOSCOPY WITH PROPOFOL;  Surgeon: Lucilla Lame, MD;  Location: [place];  Service: Endoscopy;  Laterality: N/A;  specimens not taken--pt on Plavix will be brought back in after 7 days off med   COLONOSCOPY WITH PROPOFOL N/A 11/05/2017   Procedure: COLONOSCOPY WITH PROPOFOL;  Surgeon: Lucilla Lame, MD;  Location: [place];  Service: Endoscopy;  Laterality: N/A;  ESOPHAGOGASTRODUODENOSCOPY N/A 12/21/2014   Procedure: ESOPHAGOGASTRODUODENOSCOPY (EGD);  Surgeon: Lucilla Lame, MD;  Location: [place];  Service: Gastroenterology;  Laterality: N/A;   ESOPHAGOGASTRODUODENOSCOPY (EGD) WITH PROPOFOL N/A 02/08/2016   Procedure: ESOPHAGOGASTRODUODENOSCOPY (EGD) WITH PROPOFOL;  Surgeon: Lucilla Lame, MD;  Location: [facility];  Service: Endoscopy;  Laterality: N/A;   FASCIOTOMY Right 05/30/2019   Procedure: FASCIOTOMY;  Surgeon: [name], MD;  Location: [facility];  Service: Vascular;  Laterality: Right;   FASCIOTOMY CLOSURE Right 06/04/2019   Procedure: FASCIOTOMY CLOSURE;  Surgeon: [name], MD;  Location: [facility];  Service: Vascular;  Laterality: Right;   HEMORROIDECTOMY  2014   LOWER EXTREMITY ANGIOGRAPHY Right  05/30/2019   Procedure: LOWER EXTREMITY ANGIOGRAPHY;  Surgeon: [name], MD;  Location: [place] [facility];  Service: Cardiovascular;  Laterality: Right;   LOWER EXTREMITY ANGIOGRAPHY Right 07/02/2020   Procedure: LOWER EXTREMITY ANGIOGRAPHY;  Surgeon: [name], MD;  Location: [place] [facility];  Service: Cardiovascular;  Laterality: Right;   PERIPHERAL VASCULAR CATHETERIZATION N/A 02/09/2016   Procedure: Abdominal Aortogram w/Lower Extremity;  Surgeon: [name], MD;  Location: [place] [facility];  Service: Cardiovascular;  Laterality: N/A;   PERIPHERAL VASCULAR CATHETERIZATION Right 02/10/2016   Procedure: Lower Extremity Angiography;  Surgeon: [name], MD;  Location: [place] [facility];  Service: Cardiovascular;  Laterality: Right;   POLYPECTOMY  11/05/2017   Procedure: POLYPECTOMY;  Surgeon: Lucilla Lame, MD;  Location: [place];  Service: Endoscopy;;   SHOULDER ARTHROSCOPY WITH ROTATOR CUFF REPAIR AND SUBACROMIAL DECOMPRESSION Right 02/27/2020   Procedure: RIGHT SHOULDER ARTHROSCOPY SUBACROMIAL DECOMPRESSION, DISTAL CLAVICLE EXCISION AND MINI-OPEN ROTATOR CUFF REPAIR;  Surgeon: Thornton Park, MD;  Location: [facility];  Service: Orthopedics;  Laterality: Right;   TONSILLECTOMY     VASCULAR SURGERY  5732,2025   Fem-Pop Bypass    Social History Social History   Tobacco Use   Smoking status: Former    Packs/day: 2.00    Years: 25.00    Total pack years: 50.00    Types: Cigarettes    Quit date: 08/22/1991    Years since quitting: 30.5   Smokeless tobacco: Never  Vaping Use   Vaping Use: Never used  Substance Use Topics   Alcohol use: No    Alcohol/week: 0.0 standard drinks of alcohol   Drug use: Never    Family History Family History  Problem Relation Age of Onset   CVA Mother    Heart attack Mother    Cancer Father        colon cancer   Colon cancer Father    Heart disease Maternal Uncle    Breast cancer Neg Hx      Allergies  Allergen Reactions   Hydrocodone-Acetaminophen     Other reaction(s): Flushing Other reaction(s): Flushing   Amoxicillin Other (See Comments)    Yeast infection   Metoprolol Tartrate Rash   Oxycodone Itching   Vicodin [Hydrocodone-Acetaminophen] Hives and Rash    Flushing     REVIEW OF SYSTEMS (Negative unless checked)  Constitutional: '[]'$ Weight loss  '[]'$ Fever  '[]'$ Chills Cardiac: '[]'$ Chest pain   '[]'$ Chest pressure   '[]'$ Palpitations   '[]'$ Shortness of breath when laying flat   '[]'$ Shortness of breath with exertion. Vascular:  '[x]'$ Pain in legs with walking   '[]'$ Pain in legs at rest  '[]'$ History of DVT   '[]'$ Phlebitis   '[]'$ Swelling in legs   '[]'$ Varicose veins   '[]'$ Non-healing ulcers Pulmonary:   '[]'$ Uses home oxygen   '[]'$   Productive cough   '[]'$ Hemoptysis   '[]'$ Wheeze  '[]'$ COPD   '[]'$ Asthma Neurologic:  '[]'$ Dizziness   '[]'$ Seizures   '[]'$ History of stroke   '[]'$ History of TIA  '[]'$ Aphasia   '[]'$ Vissual changes   '[]'$ Weakness or numbness in arm   '[]'$ Weakness or numbness in leg Musculoskeletal:   '[]'$ Joint swelling   '[]'$ Joint pain   '[]'$ Low back pain Hematologic:  '[]'$ Easy bruising  '[]'$ Easy bleeding   '[]'$ Hypercoagulable state   '[]'$ Anemic Gastrointestinal:  '[]'$ Diarrhea   '[]'$ Vomiting  '[]'$ Gastroesophageal reflux/heartburn   '[]'$ Difficulty swallowing. Genitourinary:  '[]'$ Chronic kidney disease   '[]'$ Difficult urination  '[]'$ Frequent urination   '[]'$ Blood in urine Skin:  '[]'$ Rashes   '[]'$ Ulcers  Psychological:  '[]'$ History of anxiety   '[]'$  History of major depression.  Physical Examination  Vitals:   02/16/22 0955  BP: 125/71  Pulse: 78  Resp: 17  Weight: 130 lb (59 kg)  Height: '5\' 3"'$  (1.6 m)   Body mass index is 23.03 kg/m. Gen: WD/WN, NAD Head: [place]/AT, No temporalis wasting.  Ear/Nose/Throat: Hearing grossly intact, nares w/o erythema or drainage Eyes: PER, EOMI, sclera nonicteric.  Neck: Supple, no masses.  No bruit or JVD.  Pulmonary:  Good air movement, no audible wheezing, no use of accessory muscles.  Cardiac: RRR, normal S1, S2, no  Murmurs. Vascular:  mild trophic changes, no open wounds; right AKA wearing prosthesis Vessel Right Left  Radial Palpable Palpable  PT AKA Not Palpable  DP AKA Not Palpable  Gastrointestinal: soft, non-distended. No guarding/no peritoneal signs.  Musculoskeletal: M/S 5/5 throughout.  No visible deformity.  Neurologic: CN 2-12 intact. Pain and light touch intact in extremities.  Symmetrical.  Speech is fluent. Motor exam as listed above. Psychiatric: Judgment intact, Mood & affect appropriate for pt's clinical situation. Dermatologic: No rashes or ulcers noted.  No changes consistent with cellulitis.   CBC Lab Results  Component Value Date   WBC 5.3 06/24/2021   HGB 13.7 06/24/2021   HCT 42.1 06/24/2021   MCV 97.5 06/24/2021   PLT 163 06/24/2021    BMET    Component Value Date/Time   NA 136 06/24/2021 1139   NA 139 09/21/2015 0916   K 3.8 06/24/2021 1139   CL 102 06/24/2021 1139   CO2 26 06/24/2021 1139   GLUCOSE 105 (H) 06/24/2021 1139   BUN 15 06/24/2021 1139   BUN 12 09/21/2015 0916   CREATININE 0.80 06/24/2021 1139   CREATININE 0.88 05/16/2016 1034   CALCIUM 8.7 (L) 06/24/2021 1139   GFRNONAA >60 06/24/2021 1139   GFRNONAA 66 05/01/2016 0846   GFRAA >60 02/18/2020 0949   GFRAA 76 05/01/2016 0846   CrCl cannot be calculated (Patient's most recent lab result is older than the maximum 21 days allowed.).  COAG Lab Results  Component Value Date   INR 1.0 08/09/2020   INR 1.3 (H) 02/18/2020   INR 1.0 07/11/2019    Radiology No results found.   Assessment/Plan 1. Atherosclerosis of native artery of right lower extremity with rest pain ([place])  Recommend:  The patient has evidence of atherosclerosis of the lower extremities with claudication.  The patient does not voice lifestyle limiting changes at this point in time.  Noninvasive studies do not suggest clinically significant change.  No invasive studies, angiography or surgery at this time The patient  should continue walking and begin a more formal exercise program.  The patient should continue antiplatelet therapy and aggressive treatment of the lipid abnormalities  No changes in the patient's medications at this time  Continued surveillance is indicated as atherosclerosis is likely to progress with time.    The patient will continue follow up with noninvasive studies as ordered.   - VAS [place] ABI WITH/WO TBI; Future  2. Stenosis of right carotid artery Recommend:  Given the patient's asymptomatic subcritical stenosis no further invasive testing or surgery at this time.  Continue antiplatelet therapy as prescribed Continue management of CAD, HTN and Hyperlipidemia Healthy heart diet,  encouraged exercise at least 4 times per week Follow up in 12 months with duplex ultrasound and physical exam      [name], MD  02/16/2022 10:45 AM

## 2022-02-16 NOTE — Progress Notes (Signed)
Safety precautions to be maintained throughout the outpatient stay will include: orient to surroundings, keep bed in low position, maintain call bell within reach at all times, provide assistance with transfer out of bed and ambulation.  

## 2022-02-16 NOTE — Progress Notes (Signed)
PROVIDER NOTE: Information contained herein reflects review and annotations entered in association with encounter. Interpretation of such information and data should be left to medically-trained personnel. Information provided to patient can be located elsewhere in the medical record under "Patient Instructions". Document created using STT-dictation technology, any transcriptional errors that may result from process are unintentional.    Patient: Sara [name]  Service Category: E/M  Provider: Gillis Santa, MD  DOB: 10/10/1950  DOS: 02/16/2022  Specialty: Interventional Pain Management  MRN: [id number]  Setting: Ambulatory outpatient  PCP: Danelle Berry, NP  Type: Established Patient    Referring Provider: Danelle Berry, NP  Location: Office  Delivery: Face-to-face     HPI  [name], a 71 y.o. year old female, is here today because of her PVD (peripheral vascular disease) ([place]) [I73.9]. [name] primary complain today is Leg Pain (right) Last encounter: My last encounter with her was on 02/14/2022. Pain Assessment: Severity of Chronic pain is reported as a 8 /10. Location: Foot Right/ . Onset: More than a month ago. Quality: Tingling (tapping). Timing: Constant. Modifying factor(s): gabapentin. Vitals:  height is 5' 3"  (1.6 m) and weight is 123 lb (55.8 kg). Her temperature is 97 F (36.1 C) (abnormal). Her blood pressure is 122/68 and her pulse is 86. Her respiration is 16 and oxygen saturation is 97%.   Reason for encounter:  Patient is finding benefit with gabapentin at 600 mg 3 times a day.  She states that her neuropathic pain of her right leg is about 50% better.  She is not noticing any side effects.  Consider dose escalation to 800 mg 3 times a day.  Creatinine within normal limits.  We will continue to monitor.  Follow-up in 4 months for medication management.  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  She is currently on gabapentin 300 mg 3 times a day as well as Cymbalta 60 mg twice a day.  The Cymbalta is also for her depression and anxiety.  She sees [facility] for her right shoulder pain related to right shoulder rotator cuff arthropathy.  She is status post right shoulder rotator cuff surgery many years ago which was also done at [facility].  She has plans to have a right shoulder steroid injection, repeat this week.  She states that she has had these in the past and they were not effective.  She is currently on Eliquis.  She is also on hydrocodone for chronic pain which is managed by [facility].  She is here today to discuss options for her right phantom pain.PMP checked and reviewed.   ROS  Constitutional: Denies any fever or chills Gastrointestinal: No reported hemesis, hematochezia, vomiting, or acute GI distress Musculoskeletal: Denies any acute onset joint swelling, redness, loss of ROM, or weakness Neurological:  Right lower extremity phantom pain, neuropathic pain  Medication Review  DULoxetine, Tocilizumab, alendronate, apixaban, atorvastatin, calcium carbonate, cholecalciferol, gabapentin, ibandronate, losartan, omeprazole, oxyCODONE-acetaminophen, and zolpidem  History Review  Allergy: [name] is allergic to hydrocodone-acetaminophen, amoxicillin, metoprolol tartrate, oxycodone, and vicodin [hydrocodone-acetaminophen]. Drug: [name]  reports no history of drug use. Alcohol:  reports no history of alcohol use. Tobacco:  reports that she quit smoking about 30 years ago. Her smoking use included cigarettes. She has a 50.00 pack-year smoking history. She has never used smokeless tobacco.  Social: [name]  reports that she quit smoking about 30 years ago. Her smoking use included cigarettes. She has a 50.00 pack-year smoking history. She has never  used smokeless tobacco. She reports that she does not drink alcohol and does not use drugs. Medical:  has a past medical history of Arthritis, Depression, Dyspnea, GERD (gastroesophageal reflux disease), History of blood clots, Hyperlipidemia, Hypertension, Mild mitral regurgitation, Osteoporosis, Peripheral vascular disease ([place]), Stomach ulcer, Vascular disease, Vertigo, and Wears dentures. Surgical: [name]  has a past surgical history that includes Vascular surgery [phone]); Hemorroidectomy (2014); Esophagogastroduodenoscopy (N/A, 12/21/2014); Esophagogastroduodenoscopy (egd) with propofol (N/A, 02/08/2016); Cardiac catheterization (N/A, 02/09/2016); Cardiac catheterization (Right, 02/10/2016); Cardiac catheterization (02/15/2017); Colonoscopy with propofol (N/A, 10/22/2017); Colonoscopy with propofol (N/A, 11/05/2017); polypectomy (11/05/2017); Tonsillectomy; Adenoidectomy; Lower Extremity Angiography (Right, 05/30/2019); Fasciotomy closure (Right, 06/04/2019); Artery Biopsy (Right, 07/14/2019); Fasciotomy (Right, 05/30/2019); Breast cyst aspiration (Left); Shoulder arthroscopy with rotator cuff repair and subacromial decompression (Right, 02/27/2020); Lower Extremity Angiography (Right, 07/02/2020); and Amputation (Right, 08/11/2020). Family: family history includes CVA in her mother; Cancer in her father; Colon cancer in her father; Heart attack in her mother; Heart disease in her maternal uncle.  Laboratory Chemistry Profile   Renal Lab Results  Component Value Date   BUN 15 06/24/2021   CREATININE 0.80 06/24/2021   BCR 13 09/21/2015   GFRAA >60 02/18/2020   GFRNONAA >60 06/24/2021    Hepatic Lab Results  Component Value Date   AST 26 06/24/2021   ALT 23 06/24/2021   ALBUMIN 3.9 06/24/2021   ALKPHOS 54 06/24/2021    Electrolytes Lab Results  Component Value Date   NA 136 06/24/2021   K 3.8 06/24/2021   CL 102 06/24/2021   CALCIUM 8.7 (L) 06/24/2021   MG 1.9 07/03/2020   PHOS 3.6  06/02/2019    Bone Lab Results  Component Value Date   VD25OH 45.8 09/21/2015    Inflammation (CRP: Acute Phase) (ESR: Chronic Phase) Lab Results  Component Value Date   ESRSEDRATE 58 (H) 07/03/2019   LATICACIDVEN 0.8 06/01/2019         Note: Above Lab results reviewed.  Recent Imaging Review  MM 3D SCREEN BREAST BILATERAL CLINICAL DATA:  Screening.  EXAM: DIGITAL SCREENING BILATERAL MAMMOGRAM WITH TOMOSYNTHESIS AND CAD  TECHNIQUE: Bilateral screening digital craniocaudal and mediolateral oblique mammograms were obtained. Bilateral screening digital breast tomosynthesis was performed. The images were evaluated with computer-aided detection.  COMPARISON:  Previous exam(s).  ACR Breast Density Category b: There are scattered areas of fibroglandular density.  FINDINGS: There are no findings suspicious for malignancy.  IMPRESSION: No mammographic evidence of malignancy. A result letter of this screening mammogram will be mailed directly to the patient.  RECOMMENDATION: Screening mammogram in one year. (Code:[id number]-B-01Y)  BI-RADS CATEGORY  1: Negative.  Electronically Signed   By: Everlean Alstrom M.D.   On: 12/05/2021 16:16 Note: Reviewed        Physical Exam  General appearance: Well nourished, well developed, and well hydrated. In no apparent acute distress Mental status: Alert, oriented x 3 (person, place, & time)       Respiratory: No evidence of acute respiratory distress Eyes: PERLA Vitals: BP 122/68   Pulse 86   Temp (!) 97 F (36.1 C)   Resp 16   Ht 5' 3"  (1.6 m)   Wt 123 lb (55.8 kg)   SpO2 97%   BMI 21.79 kg/m  BMI: Estimated body mass index is 21.79 kg/m as calculated from the following:  Height as of this encounter: 5' 3"  (1.6 m).   Weight as of this encounter: 123 lb (55.8 kg). Ideal: Ideal body weight: 52.4 kg (115 lb 8.3 oz) Adjusted ideal body weight: 53.8 kg (118 lb 8.2 oz)    Lumbar Spine Area Exam  Skin & Axial Inspection: No  masses, redness, or swelling Alignment: Symmetrical Functional ROM: Pain restricted ROM       Stability: No instability detected Muscle Tone/Strength: Functionally intact. No obvious neuro-muscular anomalies detected. Sensory (Neurological): Musculoskeletal pain pattern   Gait & Posture Assessment  Ambulation: Unassisted Gait: Relatively normal for age and body habitus Posture: WNL  Lower Extremity Exam      Side: Right lower extremity   Side: Left lower extremity  Stability: No instability observed           Stability: No instability observed          Skin & Extremity Inspection: Above knee amputation (AKA)   Skin & Extremity Inspection: Skin color, temperature, and hair growth are WNL. No peripheral edema or cyanosis. No masses, redness, swelling, asymmetry, or associated skin lesions. No contractures.  Functional ROM: Pain restricted ROM                   Functional ROM: Unrestricted ROM                  Muscle Tone/Strength: Functionally intact. No obvious neuro-muscular anomalies detected.   Muscle Tone/Strength: Functionally intact. No obvious neuro-muscular anomalies detected.  Sensory (Neurological): Neurogenic pain pattern         Sensory (Neurological): Unimpaired        DTR: Patellar: deferred today Achilles: deferred today Plantar: deferred today   DTR: Patellar: deferred today Achilles: deferred today Plantar: deferred today  Palpation: No palpable anomalies   Palpation: No palpable anomalies     Assessment   Diagnosis Status  1. PVD (peripheral vascular disease) ([place])   2. Rotator cuff arthropathy of right shoulder   3. Angiopathy, 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[place])   4. History of left above knee amputation [facility]) (Dec 2021)   5. Phantom pain after amputation of lower extremity (HCC) ([facility])   6. Hx of rotator cuff surgery   7. Chronic pain syndrome    Controlled Controlled Controlled    Plan of Care   [name] has a current medication list which includes the  following long-term medication(s): apixaban, atorvastatin, calcium carbonate, duloxetine, gabapentin, losartan, and actemra actpen.  Pharmacotherapy (Medications Ordered): Meds ordered this encounter  Medications   gabapentin (NEURONTIN) 800 MG tablet    Sig: Take 1 tablet (800 mg total) by mouth 3 (three) times daily.    Dispense:  90 tablet    Refill:  3    Follow-up plan:   Return in about 4 months (around 06/18/2022) for Medication Management, in person.     1.  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  Discussed treatment options which include titration of gabapentin to a more therapeutic dose.  Can also consider trial of Lyrica in future.  2.  Consider lidocaine infusion for right lower extremity phantom limb pain.  QTc checked from EKG in November 2022 and is appropriate.  Will need updated EKG if we are considering lidocaine infusion.  3.  Consider spinal cord stimulation for phantom limb pain.  Informed patient that there is mixed evidence on this but this could be a treatment option.  4.  Right rotator cuff arthropathy and dysfunction: Consider right  suprascapular nerve block and possible RFA or suprascapular peripheral nerve stimulation.   Recent Visits Date Type Provider Dept  12/27/21 Office Visit Gillis Santa, MD [facility]  Showing recent visits within past 90 days and meeting all other requirements Today's Visits Date Type Provider Dept  02/16/22 Office Visit Gillis Santa, MD [facility]  Showing today's visits and meeting all other requirements Future Appointments No visits were found meeting these conditions. Showing future appointments within next 90 days and meeting all other requirements  I discussed the assessment and treatment plan with the patient. The patient was provided an opportunity to ask questions and all were answered. The patient agreed with the plan and demonstrated an understanding of the  instructions.  Patient advised to call back or seek an in-person evaluation if the symptoms or condition worsens.  Duration of encounter: 79mnutes.  Total time on encounter, as per AMA guidelines included both the face-to-face and non-face-to-face time personally spent by the physician and/or other qualified health care professional(s) on the day of the encounter (includes time in activities that require the physician or other qualified health care professional and does not include time in activities normally performed by clinical staff). Physician's time may include the following activities when performed: preparing to see the patient (eg, review of tests, pre-charting review of records) obtaining and/or reviewing separately obtained history performing a medically appropriate examination and/or evaluation counseling and educating the patient/family/caregiver ordering medications, tests, or procedures referring and communicating with other health care professionals (when not separately reported) documenting clinical information in the electronic or other health record independently interpreting results (not separately reported) and communicating results to the patient/ family/caregiver care coordination (not separately reported)  Note by: BGillis Santa MD Date: 02/16/2022; Time: 10:13 AM

## 2022-02-16 NOTE — Therapy (Signed)
OUTPATIENT PHYSICAL THERAPY SHOULDER/BACK TREATMENT   Patient Name: Sara [name] MRN: [id number] DOB:1951/03/05, 71 y.o., female Today's Date: 02/17/2022   PT End of Session - 02/16/22 1756     Visit Number 2    Number of Visits 25    Date for PT Re-Evaluation 05/09/22    Authorization Type eval: 02/14/22    PT Start Time 3151    PT Stop Time 7616    PT Time Calculation (min) 40 min    Activity Tolerance Patient tolerated treatment well    Behavior During Therapy [facility] for tasks assessed/performed              Past Medical History:  Diagnosis Date   Arthritis    right shoulder   Depression    Dyspnea    GERD (gastroesophageal reflux disease)    History of blood clots    Hyperlipidemia    Hypertension    Mild mitral regurgitation    Osteoporosis    Peripheral vascular disease ([place])    Stomach ulcer    Vascular disease    Sees Dr. Delana Meyer   Vertigo    Last episode approx Aug 2015   Wears dentures    full upper   Past Surgical History:  Procedure Laterality Date   ADENOIDECTOMY     AMPUTATION Right 08/11/2020   Procedure: AMPUTATION ABOVE KNEE;  Surgeon: [name], MD;  Location: [facility];  Service: Vascular;  Laterality: Right;   ARTERY BIOPSY Right 07/14/2019   Procedure: BIOPSY TEMPORAL ARTERY;  Surgeon: [name], MD;  Location: [facility];  Service: Vascular;  Laterality: Right;   BREAST CYST ASPIRATION Left    CARDIAC CATHETERIZATION  02/15/2017   [facility]   COLONOSCOPY WITH PROPOFOL N/A 10/22/2017   Procedure: COLONOSCOPY WITH PROPOFOL;  Surgeon: Lucilla Lame, MD;  Location: [place];  Service: Endoscopy;  Laterality: N/A;  specimens not taken--pt on Plavix will be brought back in after 7 days off med   COLONOSCOPY WITH PROPOFOL N/A 11/05/2017   Procedure: COLONOSCOPY WITH PROPOFOL;  Surgeon: Lucilla Lame, MD;  Location: [place];  Service: Endoscopy;  Laterality: N/A;   ESOPHAGOGASTRODUODENOSCOPY N/A 12/21/2014    Procedure: ESOPHAGOGASTRODUODENOSCOPY (EGD);  Surgeon: Lucilla Lame, MD;  Location: [place];  Service: Gastroenterology;  Laterality: N/A;   ESOPHAGOGASTRODUODENOSCOPY (EGD) WITH PROPOFOL N/A 02/08/2016   Procedure: ESOPHAGOGASTRODUODENOSCOPY (EGD) WITH PROPOFOL;  Surgeon: Lucilla Lame, MD;  Location: [facility];  Service: Endoscopy;  Laterality: N/A;   FASCIOTOMY Right 05/30/2019   Procedure: FASCIOTOMY;  Surgeon: [name], MD;  Location: [facility];  Service: Vascular;  Laterality: Right;   FASCIOTOMY CLOSURE Right 06/04/2019   Procedure: FASCIOTOMY CLOSURE;  Surgeon: [name], MD;  Location: [facility];  Service: Vascular;  Laterality: Right;   HEMORROIDECTOMY  2014   LOWER EXTREMITY ANGIOGRAPHY Right 05/30/2019   Procedure: LOWER EXTREMITY ANGIOGRAPHY;  Surgeon: [name], MD;  Location: [place] [facility];  Service: Cardiovascular;  Laterality: Right;   LOWER EXTREMITY ANGIOGRAPHY Right 07/02/2020   Procedure: LOWER EXTREMITY ANGIOGRAPHY;  Surgeon: [name], MD;  Location: [place] [facility];  Service: Cardiovascular;  Laterality: Right;   PERIPHERAL VASCULAR CATHETERIZATION N/A 02/09/2016   Procedure: Abdominal Aortogram w/Lower Extremity;  Surgeon: [name], MD;  Location: [place] [facility];  Service: Cardiovascular;  Laterality: N/A;   PERIPHERAL VASCULAR CATHETERIZATION Right 02/10/2016   Procedure: Lower Extremity Angiography;  Surgeon: [name], MD;  Location: [place]  [facility];  Service: Cardiovascular;  Laterality: Right;   POLYPECTOMY  11/05/2017   Procedure: POLYPECTOMY;  Surgeon: Lucilla Lame, MD;  Location: [place];  Service: Endoscopy;;   SHOULDER ARTHROSCOPY WITH ROTATOR CUFF REPAIR AND SUBACROMIAL DECOMPRESSION Right 02/27/2020   Procedure: RIGHT SHOULDER ARTHROSCOPY SUBACROMIAL DECOMPRESSION, DISTAL CLAVICLE EXCISION AND MINI-OPEN ROTATOR CUFF REPAIR;  Surgeon: Thornton Park, MD;  Location:  [facility];  Service: Orthopedics;  Laterality: Right;   TONSILLECTOMY     VASCULAR SURGERY  [id number],[id number]   Fem-Pop Bypass   Patient Active Problem List   Diagnosis Date Noted   Degeneration of lumbar intervertebral disc 01/04/2022   Shoulder pain 01/04/2022   Pain in joint of right shoulder 01/04/2022   Spasm of back muscles 01/04/2022   Thoracic back pain 01/04/2022   History of left above knee amputation [facility]) (Dec 2021) 12/27/2021   Rotator cuff arthropathy of right shoulder 12/27/2021   Hx of rotator cuff surgery 12/27/2021   Chronic pain syndrome 12/27/2021   Adhesive capsulitis of right shoulder 12/05/2021   Phantom pain ([place]) 11/10/2021   Muscle spasm 09/09/2020   S/P AKA (above knee amputation), right ([place]) 09/08/2020   Ischemia of extremity 07/02/2020   Chronic pain in right shoulder 11/25/2019   Encounter for long-term (current) use of high-risk medication 07/16/2019   GCA (giant cell arteritis) ([place]) 07/16/2019   Temporal arteritis ([place]) 07/13/2019   Postoperative wound infection 06/23/2019   Ischemic leg 05/31/2019   Atherosclerosis of native arteries of extremity with intermittent claudication ([place]) 05/30/2019   Depression 05/29/2019   Eczema of lower extremity 03/04/2018   Chronic venous insufficiency 03/02/2018   Personal history of colonic polyps    Family history of colonic polyps    Benign neoplasm of ascending colon    Mitral valve insufficiency 02/08/2017   Dyspnea on exertion 02/07/2017   Non-rheumatic mitral regurgitation 02/07/2017   Precordial pain 02/07/2017   CAD (coronary artery disease) 12/21/2016   Essential hypertension 12/21/2016   Compression fracture of lumbar spine, non-traumatic ([place]) 04/26/2016   Compression fracture of L3 lumbar vertebra 04/20/2016   PVD (peripheral vascular disease) ([place]) 02/24/2016   Family history of premature CAD 02/24/2016   Gastritis    Other specified diseases of esophagus    Hiatal hernia    Gastritis and  gastroduodenitis    Ischemia of lower extremity    Arterial occlusion ([place])    Atherosclerosis of aorta ([place])    History of smoking    Hyperlipidemia    Pain in the chest    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 02/05/2016   Carotid artery stenosis 01/04/2016   Vertigo, benign paroxysmal 12/28/2015   Clinical depression 09/06/2015   History of alcoholism ([place]) 09/06/2015   Angiopathy, 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[place]) 09/06/2015   Atherosclerosis of native arteries of extremity with rest pain ([place]) 09/06/2015   Acute non-recurrent maxillary sinusitis 07/14/2015   Vaginal pruritus 05/26/2015   Chronic recurrent major depressive disorder ([place]) 03/09/2015   Osteoporosis, post-menopausal 03/09/2015   Peripheral blood vessel disorder ([place]) 03/09/2015   Acid reflux 03/09/2015   GERD (gastroesophageal reflux disease) 12/21/2014   Carotid artery narrowing 01/28/2014   Peripheral arterial occlusive disease ([place]) 06/08/2011   Occlusion and stenosis of unspecified carotid artery 06/08/2011   PAD (peripheral artery disease) ([place]) 06/08/2011    PCP: Danelle Berry, NP  REFERRING PROVIDER: Barrie Lyme MD  REFERRING DIAGNOSIS: S29.012A (ICD-10-CM) - Strain of muscle and tendon of back wall of thorax, initial encounter  THERAPY DIAG: Other low back pain  Muscle weakness (generalized)  RATIONALE FOR EVALUATION AND TREATMENT: Rehabilitation  ONSET DATE: 08/11/20 (approximate)  FROM INITIAL EVALUATION  SUBJECTIVE:                                                                                                                                                                                         Chief Complaint: R thoracic/rib pain  Pertinent History Pt reports she she started experiencing R shoulder pain which started shortly after her RLE amputation (08/11/20). She attributes the pain to having to self-propel a wheelchair for an extended period of time. She has seen Dr.  Mack Guise at [facility] in the past and underwent a R shoulder mini-open rotator cuff repair, DCE, and SAD on 02/27/20. Since the pain started she has undergone R shoulder injections without improvement in her pain. When pt describes her pain she points to the area below her R shoulder blade around T10/T11. She denies weakness in the RUE or any pain in the area of the [facility] joint or shoulder pain. Denies N/T in back or RUE. Pain worsens with extended use of RUE especially when cleaning the counters in her apartment or sweeping the floor. She has a history of L1 and L3 compression fractures in 2017. She currently sees pain management for her R shoulder pain and takes oxycodone to manage the pain. She also struggles with chronic RLE phantom pain s/p amputation and takes gabapentin for this issue. She has a history of multiple vascular surgeries prior to her amputation and also has a history of carpal tunnel syndrome.  Pain:  Pain Intensity: Not rated today Pain location: R back/flank inferior to the shoulder blade Pain Quality: sharp  Radiating: No  Numbness/Tingling: No Focal Weakness: No Aggravating factors: Excessive use of RUE, mopping the floors, wiping countertops, no pain with driving, Relieving factors: heating pad, never tried lidocaine patch, oxycodone, gabapentin doesn't help with R flank/back pain; 24-hour pain behavior: varies depending on activity History of prior shoulder or neck/shoulder injury, pain, surgery, or therapy: Yes Falls: Has patient fallen in last 6 months? 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  Recent memory is intact.  Remote memory is intact.  Attention span and concentration are intact.  Expressive speech is intact.  Patient's fund of knowledge is within normal limits for educational level.    Gross Musculoskeletal Assessment Tremor: None Bulk: Normal Tone: Normal  Gait Pt ambulates with RLE prosthesis and lofstrand in LUE, decreased self-selected gait speed;  Posture Increased thoracic kyphosis, forward head, and rounded shoulder in sitting and standing;  Cervical Screen AROM: WFL and painless with overpressure in all planes Spurlings A (ipsilateral lateral flexion/axial compression): R: Negative L: Negative Spurlings B (ipsilateral lateral flexion/contralateral rotation/axial compression): R: Negative L: Negative Repeated movement: Not examined Hoffman Sign (cervical cord compression): R: Not examined L: Not examined ULTT Median: R: Not examined L: Not examined ULTT Ulnar: R: Not examined L: Not examined ULTT Radial: R: Not examined L: Not examined   AROM Cervical AROM is painless in all directions and WFL although not full. No reproduction of pain with AROM R shoulder flexion and abduction which is also limited but WFL. No gross deficits noted. Pt denies pain with thoracic flexion, extension, rotation, and lateral flexion.    LE MMT:  MMT (out of 5) Right 02/17/2022 Left 02/17/2022  Cervical (isometric)  Flexion WNL  Extension WNL  Lateral Flexion WNL WNL  Rotation WNL WNL      Shoulder   Flexion 4+ 4+  Extension    Abduction 4+ 4+  External rotation (seated) 4+ 4+   Internal rotation (seated) 4+ 4+  Horizontal abduction    Horizontal adduction    Lower Trapezius    Rhomboids        Elbow  Flexion 5 5  Extension 5 5  Pronation    Supination        Wrist  Flexion 5 5  Extension 5 5  Radial deviation    Ulnar deviation        MCP  Flexion    Extension    Abduction 5 5  Adduction 5 5  (* = pain; Blank rows = not tested)  Sensation Grossly intact to light touch bilateral UE as determined by testing dermatomes C2-T2. Proprioception and hot/cold testing deferred on this date.  Reflexes Deferred   Palpation Pt is nontender to palpation around entire R shoulder girdle anterior, lateral, and posterior. No pain with palpation to R cervical paraspinals, upper trap, rhomboids, or mid trap. She has pain to palpation along spinous processes from T9-L3 as well as along paraspinals in this region on the right.   Repeated Movements Deferred  Passive Accessory Intervertebral Motion Pt reports reproduction of pain with CPA and bilateral UPA T9-L3. Unable to fully assess mobility secondary to pain however appear grossly hypomobile with notable thoracic kyphosis when prone.   SPECIAL TESTS Rotator Cuff  Drop Arm Test: Negative Painful Arc (Pain from 60 to 120 degrees scaption): Negative Infraspinatus Muscle Test: Negative   Subacromial Impingement Hawkins-Kennedy: Negative Neer (Block scapula, PROM flexion): Negative Painful Arc (Pain from 60 to 120 degrees scaption): Negative Empty Can: Negative External Rotation Resistance: Negative Horizontal Adduction: Not examined Scapular Assist: Not examined   Labral Tear Biceps Load II (120 elevation, full ER, 90 elbow flexion, full supination, resisted elbow flexion): Not examined Crank (160 scaption, axial load with IR/ER): Not examined Active Compression Test: Not examined  Bicep  Tendon Pathology Speed (shoulder flexion to 90, external rotation, full elbow extension, and forearm  supination with resistance: Not examined Yergason's (resisted shoulder ER and supination/biceps tendon pathology): Not examined  Shoulder Instability Sulcus Sign: Not examined Anterior Apprehension: Not examined  Beighton scale: Deferred   Outcome Measures Quick DASH: Deferred FOTO: Pt completed shoulder FOTO but this appears to be more related to back pain. Will update at next therapy session.    TODAY'S TREATMENT   SUBJECTIVE: Pt reports that she has been having a lot of back pain today. She saw Dr. Holley Raring with pain management today and she states that he increased her gabapentin. Otherwise she denies any significant changes since the initial evaluation.  PAIN: Pt complains of R thoracic pain but doesn't rate on NPRS;   Ther-ex  Prone alternating hip extension x 10 BLE;   Manual Therapy  Extensive STM to R thoracic/upper lumbar erector spina group and latissimus; Rib springing to the lower thoracic ribs at rib angle;   Electrical Stimulation  IFC to R lower thoracic/upper lumbar spine over area of discomfort with [employer] unit using default settings at pt tolerated intensity (4.8) x 10 minutes with concurrent moist heat pack. Pt reports decrease in pain aftewards;   Trigger Point Dry Needling (TDN), unbilled Education performed with patient regarding potential benefit of TDN. Reviewed precautions and risks with patient. Reviewed special precautions/risks over lung fields which include pneumothorax. Reviewed signs and symptoms of pneumothorax and advised pt to go to ER immediately if these symptoms develop advise them of dry needling treatment. Extensive time spent with pt to ensure full understanding of TDN risks. Pt provided verbal consent to treatment. TDN performed to R latissimus/erector spinae group with rib block technique with 1, 0.16 x 30 single needle placement contacting the rib. Pt reports ache as well as reproduction of RLE phantom pain. Pistoning technique  utilized. Improved pain-free motion following intervention.      PATIENT EDUCATION:  Education details: Plan of care Person educated: Patient Education method: Explanation Education comprehension: verbalized understanding   HOME EXERCISE PROGRAM: None currently   ASSESSMENT:  CLINICAL IMPRESSION: Utilized electrical stimulation during session to help decrease pain and pt reports pain is improved at end of session. Also utilized extensive STM to improve muscle extensibility and decrease pain. Introduced hip extension strengthening. Note sent to pain management requesting updated plain films of thoracic and lumbar spine. Pt encouraged to follow-up as scheduled. Pt will benefit from PT services to address deficits in strength and pain in order to return to full function at home with less pain.   REHAB POTENTIAL: Fair    CLINICAL DECISION MAKING: Unstable/unpredictable  EVALUATION COMPLEXITY: High   GOALS: Goals reviewed with patient? Yes  SHORT TERM GOALS: Target date:  03/14/2022  Pt will be independent with HEP to improve strength and decrease back pain to improve pain-free function at home. Baseline:  Goal status: INITIAL   LONG TERM GOALS: Target date: 04/11/2022  Pt will increase FOTO to at least predicted improvement value in order to demonstrate significant improvement in function at home and work related to back pain  Baseline: 02/14/22: To be completed at next visit Goal status: INITIAL  2.  Pt will decrease worst back pain by at least 2 points on the NPRS in order to demonstrate clinically significant reduction in back pain. Baseline: 02/14/22: To be completed at next visit Goal status: INITIAL  3.  Pt will report at least 50% improvement in her back symptoms in  order to demonstrate clinically significant reduction in disability related to back pain;        Baseline:  Goal status: INITIAL   PLAN: PT FREQUENCY: 1-2x/week  PT DURATION: 8 weeks  PLANNED  INTERVENTIONS: Therapeutic exercises, Therapeutic activity, Neuromuscular re-education, Balance training, Gait training, Patient/Family education, Joint manipulation, Joint mobilization, Vestibular training, Canalith repositioning, Aquatic Therapy, Dry Needling, Electrical stimulation, Spinal manipulation, Spinal mobilization, Cryotherapy, Moist heat, Traction, Ultrasound, Ionotophoresis '4mg'$ /ml Dexamethasone, and Manual therapy  PLAN FOR NEXT SESSION: Back FOTO, pain scale scores, continue manual techniques for R sided back pain, assess response to TDN, continue stretching/strengthening;  Lyndel Safe Aniaya Bacha PT, DPT, GCS  Marciel Offenberger 02/17/2022, 12:12 PM

## 2022-02-22 ENCOUNTER — Ambulatory Visit: Payer: Medicare Other | Attending: Orthopedic Surgery

## 2022-02-22 ENCOUNTER — Telehealth: Payer: Self-pay

## 2022-02-22 ENCOUNTER — Telehealth: Payer: Self-pay | Admitting: *Deleted

## 2022-02-22 DIAGNOSIS — M5459 Other low back pain: Secondary | ICD-10-CM | POA: Diagnosis not present

## 2022-02-22 DIAGNOSIS — M6281 Muscle weakness (generalized): Secondary | ICD-10-CM | POA: Diagnosis not present

## 2022-02-22 DIAGNOSIS — G8929 Other chronic pain: Secondary | ICD-10-CM | POA: Insufficient documentation

## 2022-02-22 DIAGNOSIS — M25511 Pain in right shoulder: Secondary | ICD-10-CM | POA: Insufficient documentation

## 2022-02-22 NOTE — Therapy (Signed)
OUTPATIENT PHYSICAL THERAPY SHOULDER/BACK TREATMENT   Patient Name: Sara [name] MRN: [id number] DOB:09/13/1950, 71 y.o., female Today's Date: 02/22/2022   PT End of Session - 02/22/22 1003     Visit Number 3    Number of Visits 25    Date for PT Re-Evaluation 05/09/22    Authorization Type eval: 02/14/22    PT Start Time 1010    PT Stop Time 1055    PT Time Calculation (min) 45 min    Activity Tolerance Patient tolerated treatment well    Behavior During Therapy [facility] for tasks assessed/performed              Past Medical History:  Diagnosis Date   Arthritis    right shoulder   Depression    Dyspnea    GERD (gastroesophageal reflux disease)    History of blood clots    Hyperlipidemia    Hypertension    Mild mitral regurgitation    Osteoporosis    Peripheral vascular disease ([place])    Stomach ulcer    Vascular disease    Sees Dr. Delana Meyer   Vertigo    Last episode approx Aug 2015   Wears dentures    full upper   Past Surgical History:  Procedure Laterality Date   ADENOIDECTOMY     AMPUTATION Right 08/11/2020   Procedure: AMPUTATION ABOVE KNEE;  Surgeon: [name], MD;  Location: [facility];  Service: Vascular;  Laterality: Right;   ARTERY BIOPSY Right 07/14/2019   Procedure: BIOPSY TEMPORAL ARTERY;  Surgeon: [name], MD;  Location: [facility];  Service: Vascular;  Laterality: Right;   BREAST CYST ASPIRATION Left    CARDIAC CATHETERIZATION  02/15/2017   [facility]   COLONOSCOPY WITH PROPOFOL N/A 10/22/2017   Procedure: COLONOSCOPY WITH PROPOFOL;  Surgeon: Lucilla Lame, MD;  Location: [place];  Service: Endoscopy;  Laterality: N/A;  specimens not taken--pt on Plavix will be brought back in after 7 days off med   COLONOSCOPY WITH PROPOFOL N/A 11/05/2017   Procedure: COLONOSCOPY WITH PROPOFOL;  Surgeon: Lucilla Lame, MD;  Location: [place];  Service: Endoscopy;  Laterality: N/A;   ESOPHAGOGASTRODUODENOSCOPY N/A 12/21/2014    Procedure: ESOPHAGOGASTRODUODENOSCOPY (EGD);  Surgeon: Lucilla Lame, MD;  Location: [place];  Service: Gastroenterology;  Laterality: N/A;   ESOPHAGOGASTRODUODENOSCOPY (EGD) WITH PROPOFOL N/A 02/08/2016   Procedure: ESOPHAGOGASTRODUODENOSCOPY (EGD) WITH PROPOFOL;  Surgeon: Lucilla Lame, MD;  Location: [facility];  Service: Endoscopy;  Laterality: N/A;   FASCIOTOMY Right 05/30/2019   Procedure: FASCIOTOMY;  Surgeon: [name], MD;  Location: [facility];  Service: Vascular;  Laterality: Right;   FASCIOTOMY CLOSURE Right 06/04/2019   Procedure: FASCIOTOMY CLOSURE;  Surgeon: [name], MD;  Location: [facility];  Service: Vascular;  Laterality: Right;   HEMORROIDECTOMY  2014   LOWER EXTREMITY ANGIOGRAPHY Right 05/30/2019   Procedure: LOWER EXTREMITY ANGIOGRAPHY;  Surgeon: [name], MD;  Location: [place] [facility];  Service: Cardiovascular;  Laterality: Right;   LOWER EXTREMITY ANGIOGRAPHY Right 07/02/2020   Procedure: LOWER EXTREMITY ANGIOGRAPHY;  Surgeon: [name], MD;  Location: [place] [facility];  Service: Cardiovascular;  Laterality: Right;   PERIPHERAL VASCULAR CATHETERIZATION N/A 02/09/2016   Procedure: Abdominal Aortogram w/Lower Extremity;  Surgeon: [name], MD;  Location: [place] [facility];  Service: Cardiovascular;  Laterality: N/A;   PERIPHERAL VASCULAR CATHETERIZATION Right 02/10/2016   Procedure: Lower Extremity Angiography;  Surgeon: [name], MD;  Location: [place]  [facility];  Service: Cardiovascular;  Laterality: Right;   POLYPECTOMY  11/05/2017   Procedure: POLYPECTOMY;  Surgeon: Lucilla Lame, MD;  Location: [place];  Service: Endoscopy;;   SHOULDER ARTHROSCOPY WITH ROTATOR CUFF REPAIR AND SUBACROMIAL DECOMPRESSION Right 02/27/2020   Procedure: RIGHT SHOULDER ARTHROSCOPY SUBACROMIAL DECOMPRESSION, DISTAL CLAVICLE EXCISION AND MINI-OPEN ROTATOR CUFF REPAIR;  Surgeon: Thornton Park, MD;  Location:  [facility];  Service: Orthopedics;  Laterality: Right;   TONSILLECTOMY     VASCULAR SURGERY  [phone]   Fem-Pop Bypass   Patient Active Problem List   Diagnosis Date Noted   Degeneration of lumbar intervertebral disc 01/04/2022   Shoulder pain 01/04/2022   Pain in joint of right shoulder 01/04/2022   Spasm of back muscles 01/04/2022   Thoracic back pain 01/04/2022   History of left above knee amputation [facility]) (Dec 2021) 12/27/2021   Rotator cuff arthropathy of right shoulder 12/27/2021   Hx of rotator cuff surgery 12/27/2021   Chronic pain syndrome 12/27/2021   Adhesive capsulitis of right shoulder 12/05/2021   Phantom pain ([place]) 11/10/2021   Muscle spasm 09/09/2020   S/P AKA (above knee amputation), right ([place]) 09/08/2020   Ischemia of extremity 07/02/2020   Chronic pain in right shoulder 11/25/2019   Encounter for long-term (current) use of high-risk medication 07/16/2019   GCA (giant cell arteritis) ([place]) 07/16/2019   Temporal arteritis ([place]) 07/13/2019   Postoperative wound infection 06/23/2019   Ischemic leg 05/31/2019   Atherosclerosis of native arteries of extremity with intermittent claudication ([place]) 05/30/2019   Depression 05/29/2019   Eczema of lower extremity 03/04/2018   Chronic venous insufficiency 03/02/2018   Personal history of colonic polyps    Family history of colonic polyps    Benign neoplasm of ascending colon    Mitral valve insufficiency 02/08/2017   Dyspnea on exertion 02/07/2017   Non-rheumatic mitral regurgitation 02/07/2017   Precordial pain 02/07/2017   CAD (coronary artery disease) 12/21/2016   Essential hypertension 12/21/2016   Compression fracture of lumbar spine, non-traumatic ([place]) 04/26/2016   Compression fracture of L3 lumbar vertebra 04/20/2016   PVD (peripheral vascular disease) ([place]) 02/24/2016   Family history of premature CAD 02/24/2016   Gastritis    Other specified diseases of esophagus    Hiatal hernia    Gastritis and  gastroduodenitis    Ischemia of lower extremity    Arterial occlusion ([place])    Atherosclerosis of aorta ([place])    History of smoking    Hyperlipidemia    Pain in the chest    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 02/05/2016   Carotid artery stenosis 01/04/2016   Vertigo, benign paroxysmal 12/28/2015   Clinical depression 09/06/2015   History of alcoholism ([place]) 09/06/2015   Angiopathy, 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[place]) 09/06/2015   Atherosclerosis of native arteries of extremity with rest pain ([place]) 09/06/2015   Acute non-recurrent maxillary sinusitis 07/14/2015   Vaginal pruritus 05/26/2015   Chronic recurrent major depressive disorder ([place]) 03/09/2015   Osteoporosis, post-menopausal 03/09/2015   Peripheral blood vessel disorder ([place]) 03/09/2015   Acid reflux 03/09/2015   GERD (gastroesophageal reflux disease) 12/21/2014   Carotid artery narrowing 01/28/2014   Peripheral arterial occlusive disease ([place]) 06/08/2011   Occlusion and stenosis of unspecified carotid artery 06/08/2011   PAD (peripheral artery disease) ([place]) 06/08/2011    PCP: Danelle Berry, NP  REFERRING PROVIDER: Barrie Lyme MD  REFERRING DIAGNOSIS: S29.012A (ICD-10-CM) - Strain of muscle and tendon of back wall of thorax, initial encounter  THERAPY DIAG: Other low back pain  Muscle weakness (generalized)  RATIONALE FOR EVALUATION AND TREATMENT: Rehabilitation  ONSET DATE: 08/11/20 (approximate)  FROM INITIAL EVALUATION  SUBJECTIVE:                                                                                                                                                                                         Chief Complaint: R thoracic/rib pain  Pertinent History Pt reports she she started experiencing R shoulder pain which started shortly after her RLE amputation (08/11/20). She attributes the pain to having to self-propel a wheelchair for an extended period of time. She has seen Dr.  Mack Guise at [facility] in the past and underwent a R shoulder mini-open rotator cuff repair, DCE, and SAD on 02/27/20. Since the pain started she has undergone R shoulder injections without improvement in her pain. When pt describes her pain she points to the area below her R shoulder blade around T10/T11. She denies weakness in the RUE or any pain in the area of the [facility] joint or shoulder pain. Denies N/T in back or RUE. Pain worsens with extended use of RUE especially when cleaning the counters in her apartment or sweeping the floor. She has a history of L1 and L3 compression fractures in 2017. She currently sees pain management for her R shoulder pain and takes oxycodone to manage the pain. She also struggles with chronic RLE phantom pain s/p amputation and takes gabapentin for this issue. She has a history of multiple vascular surgeries prior to her amputation and also has a history of carpal tunnel syndrome.  Pain:  Pain Intensity: Not rated today Pain location: R back/flank inferior to the shoulder blade Pain Quality: sharp  Radiating: No  Numbness/Tingling: No Focal Weakness: No Aggravating factors: Excessive use of RUE, mopping the floors, wiping countertops, no pain with driving, Relieving factors: heating pad, never tried lidocaine patch, oxycodone, gabapentin doesn't help with R flank/back pain; 24-hour pain behavior: varies depending on activity History of prior shoulder or neck/shoulder injury, pain, surgery, or therapy: Yes Falls: Has patient fallen in last 6 months? 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  Recent memory is intact.  Remote memory is intact.  Attention span and concentration are intact.  Expressive speech is intact.  Patient's fund of knowledge is within normal limits for educational level.    Gross Musculoskeletal Assessment Tremor: None Bulk: Normal Tone: Normal  Gait Pt ambulates with RLE prosthesis and lofstrand in LUE, decreased self-selected gait speed;  Posture Increased thoracic kyphosis, forward head, and rounded shoulder in sitting and standing;  Cervical Screen AROM: WFL and painless with overpressure in all planes Spurlings A (ipsilateral lateral flexion/axial compression): R: Negative L: Negative Spurlings B (ipsilateral lateral flexion/contralateral rotation/axial compression): R: Negative L: Negative Repeated movement: Not examined Hoffman Sign (cervical cord compression): R: Not examined L: Not examined ULTT Median: R: Not examined L: Not examined ULTT Ulnar: R: Not examined L: Not examined ULTT Radial: R: Not examined L: Not examined   AROM Cervical AROM is painless in all directions and WFL although not full. No reproduction of pain with AROM R shoulder flexion and abduction which is also limited but WFL. No gross deficits noted. Pt denies pain with thoracic flexion, extension, rotation, and lateral flexion.    LE MMT:  MMT (out of 5) Right 02/22/2022 Left 02/22/2022  Cervical (isometric)  Flexion WNL  Extension WNL  Lateral Flexion WNL WNL  Rotation WNL WNL      Shoulder   Flexion 4+ 4+  Extension    Abduction 4+ 4+  External rotation (seated) 4+ 4+  Internal  rotation (seated) 4+ 4+  Horizontal abduction    Horizontal adduction    Lower Trapezius    Rhomboids        Elbow  Flexion 5 5  Extension 5 5  Pronation    Supination        Wrist  Flexion 5 5  Extension 5 5  Radial deviation    Ulnar deviation        MCP  Flexion    Extension    Abduction 5 5  Adduction 5 5  (* = pain; Blank rows = not tested)  Sensation Grossly intact to light touch bilateral UE as determined by testing dermatomes C2-T2. Proprioception and hot/cold testing deferred on this date.  Reflexes Deferred   Palpation Pt is nontender to palpation around entire R shoulder girdle anterior, lateral, and posterior. No pain with palpation to R cervical paraspinals, upper trap, rhomboids, or mid trap. She has pain to palpation along spinous processes from T9-L3 as well as along paraspinals in this region on the right.   Repeated Movements Deferred  Passive Accessory Intervertebral Motion Pt reports reproduction of pain with CPA and bilateral UPA T9-L3. Unable to fully assess mobility secondary to pain however appear grossly hypomobile with notable thoracic kyphosis when prone.   SPECIAL TESTS Rotator Cuff  Drop Arm Test: Negative Painful Arc (Pain from 60 to 120 degrees scaption): Negative Infraspinatus Muscle Test: Negative   Subacromial Impingement Hawkins-Kennedy: Negative Neer (Block scapula, PROM flexion): Negative Painful Arc (Pain from 60 to 120 degrees scaption): Negative Empty Can: Negative External Rotation Resistance: Negative Horizontal Adduction: Not examined Scapular Assist: Not examined   Labral Tear Biceps Load II (120 elevation, full ER, 90 elbow flexion, full supination, resisted elbow flexion): Not examined Crank (160 scaption, axial load with IR/ER): Not examined Active Compression Test: Not examined  Bicep  Tendon Pathology Speed (shoulder flexion to 90, external rotation, full elbow extension, and forearm supination with  resistance: Not examined Yergason's (resisted shoulder ER and supination/biceps tendon pathology): Not examined  Shoulder Instability Sulcus Sign: Not examined Anterior Apprehension: Not examined  Beighton scale: Deferred   Outcome Measures Quick DASH: Deferred FOTO: Pt completed shoulder FOTO but this appears to be more related to back pain. Will update at next therapy session.    TODAY'S TREATMENT   SUBJECTIVE: Pt denies current pain in R mid back. Pt reports if she is not using her RUE, she does not really have any pain. Intermittent use of heating pad since last session.   PAIN: Pt denying pain currently   02/22/22:  Ther-ex: FOTO for back pain: 45 with target of 57  Nu-Step L2 for gentle thoracic rotation and scapular protraction and retraction: 3 minutes  Seated physioball roll outs: x10/direction forwards, R/L deviations  Prone alternating hip extension 2x 10 BLE; frequent TC's on pelvis to prevent lumbar paraspinal use and rotation at pelvis for compensations. Good carryvoer after TC's  Scap retractions seated with GTB: 1x15, 2x15 with blue TB. VC's for form/technique  Gentle seated thoracic extension in standard height chair against 1/2 bolster: 1x20 reps. Discontinued when initiating second set due to pain beginning in R lower back.    Hook lying:   Lumbar trunk rotations: 2x20  Posterior pelvic tilts: 2x15. Initial TC's for form/technique.  5 minutes supine on MH at inferior angle of scapula horizontal across thoracic spine post therex for pain modulation and tissue extensibility. Unbilled.   PATIENT EDUCATION:  Education details: form/technique with exercise Person educated: Patient Education method: Explanation Education comprehension: verbalized understanding   HOME EXERCISE PROGRAM: None currently   ASSESSMENT:  CLINICAL IMPRESSION: No pain prior to session so focus of session on thoracolumbar mobility and postural and LE strengthening. Minor  increase in back pain intermittently with thoracic extension that eases post exercise. New FOTO performed for back as prior FOTO was for shoulder. Pt scored 45 with a target score expected of 57. Pt will continue to benefit from skilled PT services to progress pain free mobility as able.   REHAB POTENTIAL: Fair    CLINICAL DECISION MAKING: Unstable/unpredictable  EVALUATION COMPLEXITY: High   GOALS: Goals reviewed with patient? Yes  SHORT TERM GOALS: Target date:  03/14/2022  Pt will be independent with HEP to improve strength and decrease back pain to improve pain-free function at home. Baseline:  Goal status: INITIAL   LONG TERM GOALS: Target date: 04/11/2022  Pt will increase FOTO to at least predicted improvement value in order to demonstrate significant improvement in function at home and work related to back pain  Baseline: 02/14/22: To be completed at next visit Goal status: INITIAL  2.  Pt will decrease worst back pain by at least 2 points on the NPRS in order to demonstrate clinically significant reduction in back pain. Baseline: 02/14/22: To be completed at next visit Goal status: INITIAL  3.  Pt will report at least 50% improvement in her back symptoms in order to demonstrate clinically significant reduction in disability related to back pain;        Baseline:  Goal status: INITIAL   PLAN: PT FREQUENCY: 1-2x/week  PT DURATION: 8 weeks  PLANNED INTERVENTIONS: Therapeutic exercises, Therapeutic activity, Neuromuscular re-education, Balance training, Gait training, Patient/Family education, Joint manipulation, Joint mobilization, Vestibular training, Canalith repositioning, Aquatic Therapy, Dry Needling, Electrical stimulation, Spinal manipulation, Spinal mobilization, Cryotherapy, Moist heat, Traction, Ultrasound,  Ionotophoresis '4mg'$ /ml Dexamethasone, and Manual therapy  PLAN FOR NEXT SESSION: Continue manual techniques for R sided back pain, assess response to TDN,  continue stretching/strengthening;    Salem Caster. Fairly IV, PT, DPT Physical Therapist- [facility]  02/22/2022, 11:51 AM

## 2022-02-22 NOTE — Telephone Encounter (Signed)
Patient called back . Instructed thqat orders were placed for xrays and instructed her where to get them done. She states that whe will come tomorrow to have them done.

## 2022-02-22 NOTE — Telephone Encounter (Signed)
-----   Message from Gillis Santa, MD sent at 02/16/2022  3:43 PM EDT ----- Regarding: RE: Regarding Naasia Janelle's shoulder pain Thanks for the detailed message Corene Cornea.  I will order plain films of her thoracic and lumbar spine.  Lilja Soland- please call the pt and inform her where to go for her xrays   ----- Message ----- From: Phillips Grout, PT Sent: 02/16/2022  11:00 AM EDT To: Gillis Santa, MD Subject: Regarding Luretta Knoke's shoulder pain        Dr. Holley Raring,  I evaluated Ms. Everardo All yesterday with a referral from Dr. Mack Guise yesterday regarding her R shoulder pain. I know you are helping manage this issue. When I talked to her and examined her she actually describes the pain in her R sided lower thoracic spine/upper lumbar spine and she keeps calling it her "shoulder." All of her AROM, strength testing, and special testing of her R shoulder was strong and painless. She has a history of L1 and L3 compression fractures and states she has not had any updated radiographs of her spine in a long time. If this is in fact true I think it could be helpful to get some plain films of her thoracic and lumbar spine to see if these areas have progressed or new compression fractures have developed which are contributing to her pain. During my exam she was exceedingly painful to palpation over these areas of her spine. Thanks for your consideration.  Professionally, Roxana Hires PT, DPT, [facility]

## 2022-02-22 NOTE — Telephone Encounter (Signed)
Attempted to call patient to inform her that x-rays have been ordered. Message left.

## 2022-02-23 ENCOUNTER — Ambulatory Visit
Admission: RE | Admit: 2022-02-23 | Discharge: 2022-02-23 | Disposition: A | Discharge status: Home / Self Care | Payer: Medicare Other | Attending: Student in an Organized Health Care Education/Training Program | Admitting: Student in an Organized Health Care Education/Training Program

## 2022-02-23 ENCOUNTER — Ambulatory Visit
Admission: RE | Admit: 2022-02-23 | Discharge: 2022-02-23 | Disposition: A | Discharge status: Home / Self Care | Payer: Medicare Other | Source: Ambulatory Visit | Attending: Student in an Organized Health Care Education/Training Program | Admitting: Student in an Organized Health Care Education/Training Program

## 2022-02-23 DIAGNOSIS — M545 Low back pain, unspecified: Secondary | ICD-10-CM | POA: Insufficient documentation

## 2022-02-23 DIAGNOSIS — S22000S Wedge compression fracture of unspecified thoracic vertebra, sequela: Secondary | ICD-10-CM | POA: Diagnosis not present

## 2022-02-23 DIAGNOSIS — M5136 Other intervertebral disc degeneration, lumbar region: Secondary | ICD-10-CM | POA: Diagnosis not present

## 2022-02-23 DIAGNOSIS — M47814 Spondylosis without myelopathy or radiculopathy, thoracic region: Secondary | ICD-10-CM | POA: Diagnosis not present

## 2022-02-23 DIAGNOSIS — S22080A Wedge compression fracture of T11-T12 vertebra, initial encounter for closed fracture: Secondary | ICD-10-CM | POA: Diagnosis not present

## 2022-02-23 DIAGNOSIS — S32030A Wedge compression fracture of third lumbar vertebra, initial encounter for closed fracture: Secondary | ICD-10-CM | POA: Diagnosis not present

## 2022-02-23 DIAGNOSIS — M4316 Spondylolisthesis, lumbar region: Secondary | ICD-10-CM | POA: Diagnosis not present

## 2022-02-27 DIAGNOSIS — I471 Supraventricular tachycardia: Secondary | ICD-10-CM | POA: Diagnosis not present

## 2022-02-27 DIAGNOSIS — I340 Nonrheumatic mitral (valve) insufficiency: Secondary | ICD-10-CM | POA: Diagnosis not present

## 2022-02-27 DIAGNOSIS — I2510 Atherosclerotic heart disease of native coronary artery without angina pectoris: Secondary | ICD-10-CM | POA: Diagnosis not present

## 2022-02-27 DIAGNOSIS — I351 Nonrheumatic aortic (valve) insufficiency: Secondary | ICD-10-CM | POA: Diagnosis not present

## 2022-02-27 DIAGNOSIS — E785 Hyperlipidemia, unspecified: Secondary | ICD-10-CM | POA: Diagnosis not present

## 2022-02-27 DIAGNOSIS — I739 Peripheral vascular disease, unspecified: Secondary | ICD-10-CM | POA: Diagnosis not present

## 2022-02-27 DIAGNOSIS — I10 Essential (primary) hypertension: Secondary | ICD-10-CM | POA: Diagnosis not present

## 2022-02-27 NOTE — Therapy (Signed)
OUTPATIENT PHYSICAL THERAPY SHOULDER/BACK TREATMENT   Patient Name: Sara [name] MRN: [id number] DOB:04-04-1951, 71 y.o., female Today's Date: 02/28/2022   PT End of Session - 02/28/22 1108     Visit Number 4    Number of Visits 25    Date for PT Re-Evaluation 05/09/22    Authorization Type eval: 02/14/22    PT Start Time 1100    PT Stop Time 1145    PT Time Calculation (min) 45 min    Activity Tolerance Patient tolerated treatment well    Behavior During Therapy [facility] for tasks assessed/performed               Past Medical History:  Diagnosis Date   Arthritis    right shoulder   Depression    Dyspnea    GERD (gastroesophageal reflux disease)    History of blood clots    Hyperlipidemia    Hypertension    Mild mitral regurgitation    Osteoporosis    Peripheral vascular disease ([place])    Stomach ulcer    Vascular disease    Sees Dr. Delana Meyer   Vertigo    Last episode approx Aug 2015   Wears dentures    full upper   Past Surgical History:  Procedure Laterality Date   ADENOIDECTOMY     AMPUTATION Right 08/11/2020   Procedure: AMPUTATION ABOVE KNEE;  Surgeon: [name], MD;  Location: [facility];  Service: Vascular;  Laterality: Right;   ARTERY BIOPSY Right 07/14/2019   Procedure: BIOPSY TEMPORAL ARTERY;  Surgeon: [name], MD;  Location: [facility];  Service: Vascular;  Laterality: Right;   BREAST CYST ASPIRATION Left    CARDIAC CATHETERIZATION  02/15/2017   [facility]   COLONOSCOPY WITH PROPOFOL N/A 10/22/2017   Procedure: COLONOSCOPY WITH PROPOFOL;  Surgeon: Lucilla Lame, MD;  Location: [place];  Service: Endoscopy;  Laterality: N/A;  specimens not taken--pt on Plavix will be brought back in after 7 days off med   COLONOSCOPY WITH PROPOFOL N/A 11/05/2017   Procedure: COLONOSCOPY WITH PROPOFOL;  Surgeon: Lucilla Lame, MD;  Location: [place];  Service: Endoscopy;  Laterality: N/A;   ESOPHAGOGASTRODUODENOSCOPY N/A 12/21/2014    Procedure: ESOPHAGOGASTRODUODENOSCOPY (EGD);  Surgeon: Lucilla Lame, MD;  Location: [place];  Service: Gastroenterology;  Laterality: N/A;   ESOPHAGOGASTRODUODENOSCOPY (EGD) WITH PROPOFOL N/A 02/08/2016   Procedure: ESOPHAGOGASTRODUODENOSCOPY (EGD) WITH PROPOFOL;  Surgeon: Lucilla Lame, MD;  Location: [facility];  Service: Endoscopy;  Laterality: N/A;   FASCIOTOMY Right 05/30/2019   Procedure: FASCIOTOMY;  Surgeon: [name], MD;  Location: [facility];  Service: Vascular;  Laterality: Right;   FASCIOTOMY CLOSURE Right 06/04/2019   Procedure: FASCIOTOMY CLOSURE;  Surgeon: [name], MD;  Location: [facility];  Service: Vascular;  Laterality: Right;   HEMORROIDECTOMY  2014   LOWER EXTREMITY ANGIOGRAPHY Right 05/30/2019   Procedure: LOWER EXTREMITY ANGIOGRAPHY;  Surgeon: [name], MD;  Location: [place] [facility];  Service: Cardiovascular;  Laterality: Right;   LOWER EXTREMITY ANGIOGRAPHY Right 07/02/2020   Procedure: LOWER EXTREMITY ANGIOGRAPHY;  Surgeon: [name], MD;  Location: [place] [facility];  Service: Cardiovascular;  Laterality: Right;   PERIPHERAL VASCULAR CATHETERIZATION N/A 02/09/2016   Procedure: Abdominal Aortogram w/Lower Extremity;  Surgeon: [name], MD;  Location: [place] [facility];  Service: Cardiovascular;  Laterality: N/A;   PERIPHERAL VASCULAR CATHETERIZATION Right 02/10/2016   Procedure: Lower Extremity Angiography;  Surgeon: [name], MD;  Location: [facility]  INVASIVE CV LAB;  Service: Cardiovascular;  Laterality: Right;   POLYPECTOMY  11/05/2017   Procedure: POLYPECTOMY;  Surgeon: Lucilla Lame, MD;  Location: [place];  Service: Endoscopy;;   SHOULDER ARTHROSCOPY WITH ROTATOR CUFF REPAIR AND SUBACROMIAL DECOMPRESSION Right 02/27/2020   Procedure: RIGHT SHOULDER ARTHROSCOPY SUBACROMIAL DECOMPRESSION, DISTAL CLAVICLE EXCISION AND MINI-OPEN ROTATOR CUFF REPAIR;  Surgeon: Thornton Park, MD;  Location:  [facility];  Service: Orthopedics;  Laterality: Right;   TONSILLECTOMY     VASCULAR SURGERY  [id number]   Fem-Pop Bypass   Patient Active Problem List   Diagnosis Date Noted   Degeneration of lumbar intervertebral disc 01/04/2022   Shoulder pain 01/04/2022   Pain in joint of right shoulder 01/04/2022   Spasm of back muscles 01/04/2022   Thoracic back pain 01/04/2022   History of left above knee amputation [facility]) (Dec 2021) 12/27/2021   Rotator cuff arthropathy of right shoulder 12/27/2021   Hx of rotator cuff surgery 12/27/2021   Chronic pain syndrome 12/27/2021   Adhesive capsulitis of right shoulder 12/05/2021   Phantom pain ([place]) 11/10/2021   Muscle spasm 09/09/2020   S/P AKA (above knee amputation), right ([place]) 09/08/2020   Ischemia of extremity 07/02/2020   Chronic pain in right shoulder 11/25/2019   Encounter for long-term (current) use of high-risk medication 07/16/2019   GCA (giant cell arteritis) ([place]) 07/16/2019   Temporal arteritis ([place]) 07/13/2019   Postoperative wound infection 06/23/2019   Ischemic leg 05/31/2019   Atherosclerosis of native arteries of extremity with intermittent claudication ([place]) 05/30/2019   Depression 05/29/2019   Eczema of lower extremity 03/04/2018   Chronic venous insufficiency 03/02/2018   Personal history of colonic polyps    Family history of colonic polyps    Benign neoplasm of ascending colon    Mitral valve insufficiency 02/08/2017   Dyspnea on exertion 02/07/2017   Non-rheumatic mitral regurgitation 02/07/2017   Precordial pain 02/07/2017   CAD (coronary artery disease) 12/21/2016   Essential hypertension 12/21/2016   Compression fracture of lumbar spine, non-traumatic ([place]) 04/26/2016   Compression fracture of L3 lumbar vertebra 04/20/2016   PVD (peripheral vascular disease) ([place]) 02/24/2016   Family history of premature CAD 02/24/2016   Gastritis    Other specified diseases of esophagus    Hiatal hernia    Gastritis and  gastroduodenitis    Ischemia of lower extremity    Arterial occlusion ([place])    Atherosclerosis of aorta ([place])    History of smoking    Hyperlipidemia    Pain in the chest    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 02/05/2016   Carotid artery stenosis 01/04/2016   Vertigo, benign paroxysmal 12/28/2015   Clinical depression 09/06/2015   History of alcoholism ([place]) 09/06/2015   Angiopathy, 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[place]) 09/06/2015   Atherosclerosis of native arteries of extremity with rest pain ([place]) 09/06/2015   Acute non-recurrent maxillary sinusitis 07/14/2015   Vaginal pruritus 05/26/2015   Chronic recurrent major depressive disorder ([place]) 03/09/2015   Osteoporosis, post-menopausal 03/09/2015   Peripheral blood vessel disorder ([place]) 03/09/2015   Acid reflux 03/09/2015   GERD (gastroesophageal reflux disease) 12/21/2014   Carotid artery narrowing 01/28/2014   Peripheral arterial occlusive disease ([place]) 06/08/2011   Occlusion and stenosis of unspecified carotid artery 06/08/2011   PAD (peripheral artery disease) ([place]) 06/08/2011    PCP: Danelle Berry, NP  REFERRING PROVIDER: Barrie Lyme MD  REFERRING DIAGNOSIS: S29.012A (ICD-10-CM) - Strain of muscle and tendon of back wall of thorax, initial  encounter  THERAPY DIAG: Other low back pain  Muscle weakness (generalized)  RATIONALE FOR EVALUATION AND TREATMENT: Rehabilitation  ONSET DATE: 08/11/20 (approximate)  FROM INITIAL EVALUATION  SUBJECTIVE:                                                                                                                                                                                         Chief Complaint: R thoracic/rib pain  Pertinent History Pt reports she she started experiencing R shoulder pain which started shortly after her RLE amputation (08/11/20). She attributes the pain to having to self-propel a wheelchair for an extended period of time. She has seen Dr.  Mack Guise at [facility] in the past and underwent a R shoulder mini-open rotator cuff repair, DCE, and SAD on 02/27/20. Since the pain started she has undergone R shoulder injections without improvement in her pain. When pt describes her pain she points to the area below her R shoulder blade around T10/T11. She denies weakness in the RUE or any pain in the area of the [facility] joint or shoulder pain. Denies N/T in back or RUE. Pain worsens with extended use of RUE especially when cleaning the counters in her apartment or sweeping the floor. She has a history of L1 and L3 compression fractures in 2017. She currently sees pain management for her R shoulder pain and takes oxycodone to manage the pain. She also struggles with chronic RLE phantom pain s/p amputation and takes gabapentin for this issue. She has a history of multiple vascular surgeries prior to her amputation and also has a history of carpal tunnel syndrome.  Pain:  Pain Intensity: Not rated today Pain location: R back/flank inferior to the shoulder blade Pain Quality: sharp  Radiating: No  Numbness/Tingling: No Focal Weakness: No Aggravating factors: Excessive use of RUE, mopping the floors, wiping countertops, no pain with driving, Relieving factors: heating pad, never tried lidocaine patch, oxycodone, gabapentin doesn't help with R flank/back pain; 24-hour pain behavior: varies depending on activity History of prior shoulder or neck/shoulder injury, pain, surgery, or therapy: Yes Falls: Has patient fallen in last 6 months? 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  Recent memory is intact.  Remote memory is intact.  Attention span and concentration are intact.  Expressive speech is intact.  Patient's fund of knowledge is within normal limits for educational level.    Gross Musculoskeletal Assessment Tremor: None Bulk: Normal Tone: Normal  Gait Pt ambulates with RLE prosthesis and lofstrand in LUE, decreased self-selected gait speed;  Posture Increased thoracic kyphosis, forward head, and rounded shoulder in sitting and standing;  Cervical Screen AROM: WFL and painless with overpressure in all planes Spurlings A (ipsilateral lateral flexion/axial compression): R: Negative L: Negative Spurlings B (ipsilateral lateral flexion/contralateral rotation/axial compression): R: Negative L: Negative Repeated movement: Not examined Hoffman Sign (cervical cord compression): R: Not examined L: Not examined ULTT Median: R: Not examined L: Not examined ULTT Ulnar: R: Not examined L: Not examined ULTT Radial: R: Not examined L: Not examined   AROM Cervical AROM is painless in all directions and WFL although not full. No reproduction of pain with AROM R shoulder flexion and abduction which is also limited but WFL. No gross deficits noted. Pt denies pain with thoracic flexion, extension, rotation, and lateral flexion.    LE MMT:  MMT (out of 5) Right 02/28/2022 Left 02/28/2022  Cervical (isometric)  Flexion WNL  Extension WNL  Lateral Flexion WNL WNL  Rotation WNL WNL      Shoulder   Flexion 4+ 4+  Extension    Abduction 4+ 4+  External rotation (seated) 4+ 4+   Internal rotation (seated) 4+ 4+  Horizontal abduction    Horizontal adduction    Lower Trapezius    Rhomboids        Elbow  Flexion 5 5  Extension 5 5  Pronation    Supination        Wrist  Flexion 5 5  Extension 5 5  Radial deviation    Ulnar deviation        MCP  Flexion    Extension    Abduction 5 5  Adduction 5 5  (* = pain; Blank rows = not tested)  Sensation Grossly intact to light touch bilateral UE as determined by testing dermatomes C2-T2. Proprioception and hot/cold testing deferred on this date.  Reflexes Deferred   Palpation Pt is nontender to palpation around entire R shoulder girdle anterior, lateral, and posterior. No pain with palpation to R cervical paraspinals, upper trap, rhomboids, or mid trap. She has pain to palpation along spinous processes from T9-L3 as well as along paraspinals in this region on the right.   Repeated Movements Deferred  Passive Accessory Intervertebral Motion Pt reports reproduction of pain with CPA and bilateral UPA T9-L3. Unable to fully assess mobility secondary to pain however appear grossly hypomobile with notable thoracic kyphosis when prone.   SPECIAL TESTS Rotator Cuff  Drop Arm Test: Negative Painful Arc (Pain from 60 to 120 degrees scaption): Negative Infraspinatus Muscle Test: Negative   Subacromial Impingement Hawkins-Kennedy: Negative Neer (Block scapula, PROM flexion): Negative Painful Arc (Pain from 60 to 120 degrees scaption): Negative Empty Can: Negative External Rotation Resistance: Negative Horizontal Adduction: Not examined Scapular Assist: Not examined   Labral Tear Biceps Load II (120 elevation, full ER, 90 elbow flexion, full supination, resisted elbow flexion): Not examined Crank (160 scaption, axial load with IR/ER): Not examined Active Compression Test: Not examined  Bicep Tendon Pathology Speed (shoulder flexion to 90, external rotation, full elbow extension, and forearm  supination with resistance: Not examined Yergason's (resisted shoulder ER and supination/biceps tendon pathology): Not examined  Shoulder Instability Sulcus Sign: Not examined Anterior Apprehension: Not examined  Beighton scale: Deferred   Outcome Measures Quick DASH: Deferred FOTO: Pt completed shoulder FOTO but this appears to be more related to back pain. Will update at next therapy session.    TODAY'S TREATMENT (02/28/22)  SUBJECTIVE: Pt denies current pain in R mid back upon arrival at rest. No specific questions or concerns currently.  PAIN: Denies  Ther-ex: Nu-Step L2 for gentle thoracic rotation and scapular protraction and retraction x 3 minutes Prone alternating hip extension x 10 BLE; Seated scap retractions x 10; Seated rows with green tband x 10 BUE; Seated shoulder extension with green tband x 10 BUE;   Manual Therapy  Extensive STM to R thoracic/upper lumbar erector spina group and latissimus include use of Theraband roller x 15 minutes;   Electrical Stimulation  IFC to R lower thoracic/upper lumbar spine over area of discomfort with [employer] unit using default settings at pt tolerated intensity (11) x 15 minutes with concurrent moist heat pack;    PATIENT EDUCATION:  Education details: form/technique with exercise Person educated: Patient Education method: Explanation Education comprehension: verbalized understanding   HOME EXERCISE PROGRAM: None currently   ASSESSMENT:  CLINICAL IMPRESSION: Utilized electrical stimulation during session again today as well as extensive STM to improve muscle extensibility and decrease pain. Hip and upper quarter strengthening performed during session. Message sent to pain management requesting updated plan of care in light of recent radiograph findings of T11 compression fracture. Pt encouraged to follow-up as scheduled. Pt will benefit from PT services to address deficits in strength and pain in order to  return to full function at home with less pain.    REHAB POTENTIAL: Fair    CLINICAL DECISION MAKING: Unstable/unpredictable  EVALUATION COMPLEXITY: High   GOALS: Goals reviewed with patient? Yes  SHORT TERM GOALS: Target date:  03/14/2022  Pt will be independent with HEP to improve strength and decrease back pain to improve pain-free function at home. Baseline:  Goal status: INITIAL   LONG TERM GOALS: Target date: 04/11/2022  Pt will increase FOTO to at least 57 in order to demonstrate significant improvement in function at home and work related to back pain  Baseline: 02/14/22: To be completed at next visit; 02/22/22: 45 Goal status: INITIAL  2.  Pt will decrease worst back pain by at least 2 points on the NPRS in order to demonstrate clinically significant reduction in back pain. Baseline: 02/14/22: To be completed at next visit Goal status: INITIAL  3.  Pt will report at least 50% improvement in her back symptoms in order to demonstrate clinically significant reduction in disability related to back pain;        Baseline:  Goal status: INITIAL   PLAN: PT FREQUENCY: 1-2x/week  PT DURATION: 8 weeks  PLANNED INTERVENTIONS: Therapeutic exercises, Therapeutic activity, Neuromuscular re-education, Balance training, Gait training, Patient/Family education, Joint manipulation, Joint mobilization, Vestibular training, Canalith repositioning, Aquatic Therapy, Dry Needling, Electrical stimulation, Spinal manipulation, Spinal mobilization, Cryotherapy, Moist heat, Traction, Ultrasound, Ionotophoresis '4mg'$ /ml Dexamethasone, and Manual therapy  PLAN FOR NEXT SESSION: Continue manual techniques for R sided back pain, assess response to TDN, continue stretching/strengthening;    Phillips Grout PT, DPT, GCS  Physical Therapist- [facility]  02/28/2022, 1:08 PM

## 2022-02-28 ENCOUNTER — Ambulatory Visit: Payer: Medicare Other

## 2022-02-28 DIAGNOSIS — G8929 Other chronic pain: Secondary | ICD-10-CM | POA: Diagnosis not present

## 2022-02-28 DIAGNOSIS — M5459 Other low back pain: Secondary | ICD-10-CM

## 2022-02-28 DIAGNOSIS — M6281 Muscle weakness (generalized): Secondary | ICD-10-CM

## 2022-02-28 DIAGNOSIS — M25511 Pain in right shoulder: Secondary | ICD-10-CM | POA: Diagnosis not present

## 2022-03-06 ENCOUNTER — Ambulatory Visit: Payer: Medicare Other

## 2022-03-06 ENCOUNTER — Encounter: Payer: Self-pay | Admitting: Student in an Organized Health Care Education/Training Program

## 2022-03-06 DIAGNOSIS — M5459 Other low back pain: Secondary | ICD-10-CM

## 2022-03-06 DIAGNOSIS — M6281 Muscle weakness (generalized): Secondary | ICD-10-CM

## 2022-03-07 ENCOUNTER — Ambulatory Visit
Payer: Medicare Other | Attending: Student in an Organized Health Care Education/Training Program | Admitting: Student in an Organized Health Care Education/Training Program

## 2022-03-07 ENCOUNTER — Encounter: Payer: Self-pay | Admitting: Student in an Organized Health Care Education/Training Program

## 2022-03-07 DIAGNOSIS — M545 Low back pain, unspecified: Secondary | ICD-10-CM | POA: Diagnosis not present

## 2022-03-07 DIAGNOSIS — G546 Phantom limb syndrome with pain: Secondary | ICD-10-CM | POA: Diagnosis not present

## 2022-03-07 DIAGNOSIS — G894 Chronic pain syndrome: Secondary | ICD-10-CM | POA: Diagnosis not present

## 2022-03-07 DIAGNOSIS — M12811 Other specific arthropathies, not elsewhere classified, right shoulder: Secondary | ICD-10-CM

## 2022-03-07 DIAGNOSIS — S22000S Wedge compression fracture of unspecified thoracic vertebra, sequela: Secondary | ICD-10-CM | POA: Diagnosis not present

## 2022-03-07 DIAGNOSIS — I739 Peripheral vascular disease, unspecified: Secondary | ICD-10-CM | POA: Diagnosis not present

## 2022-03-07 DIAGNOSIS — M47816 Spondylosis without myelopathy or radiculopathy, lumbar region: Secondary | ICD-10-CM | POA: Diagnosis not present

## 2022-03-07 NOTE — Progress Notes (Signed)
Patient: Sara [name]  Service Category: E/M  Provider: Gillis Santa, MD  DOB: [birth year]  DOS: 03/07/2022  Location: Office  MRN: [id number]  Setting: Ambulatory outpatient  Referring Provider: Danelle Berry, NP  Type: Established Patient  Specialty: Interventional Pain Management  PCP: Danelle Berry, NP  Location: Remote location  Delivery: TeleHealth     Virtual Encounter - Pain Management PROVIDER NOTE: Information contained herein reflects review and annotations entered in association with encounter. Interpretation of such information and data should be left to medically-trained personnel. Information provided to patient can be located elsewhere in the medical record under "Patient Instructions". Document created using STT-dictation technology, any transcriptional errors that may result from process are unintentional.    Contact & Pharmacy Preferred: [phone] Home: [phone] (home) Mobile: [phone] (mobile) E-mail: [email or web address]  [id number] [place], [place] - [address] [id number]: [phone]: [phone]  Pre-screening  [name] offered "in-person" vs "virtual" encounter. She indicated preferring virtual for this encounter.   Reason COVID-19*  Social distancing based on CDC and AMA recommendations.   I contacted Sara Salkon 03/07/2022 via telephone.      I clearly identified myself as BGillis Santa MD. I verified that I was speaking with the correct person using two identifiers (Name: HEthelda [name] and date of birth: [id number].  Consent I sought verbal advanced consent from Sara Salkfor virtual visit interactions. I informed [name] possible security and privacy concerns, risks, and limitations associated with providing "not-in-person" medical evaluation and management services. I also informed Sara [name] of the availability of "in-person" appointments. Finally, I  informed her that there would be a charge for the virtual visit and that she could be  personally, fully or partially, financially responsible for it. [name] understanding and agreed to proceed.   Historic Elements   Sara [name] a 71y.o. year old, female patient evaluated today after our last contact on 02/16/2022. Ms. JRunkel has a past medical history of Arthritis, Depression, Dyspnea, GERD (gastroesophageal reflux disease), History of blood clots, Hyperlipidemia, Hypertension, Mild mitral regurgitation, Osteoporosis, Peripheral vascular disease ([place], Stomach ulcer, Vascular disease, Vertigo, and Wears dentures. She also  has a past surgical history that includes Vascular surgery (([id number]; Hemorroidectomy (2014); Esophagogastroduodenoscopy (N/A, 12/21/2014); Esophagogastroduodenoscopy (egd) with propofol (N/A, 02/08/2016); Cardiac catheterization (N/A, 02/09/2016); Cardiac catheterization (Right, 02/10/2016); Cardiac catheterization (02/15/2017); Colonoscopy with propofol (N/A, 10/22/2017); Colonoscopy with propofol (N/A, 11/05/2017); polypectomy (11/05/2017); Tonsillectomy; Adenoidectomy; Lower Extremity Angiography (Right, 05/30/2019); Fasciotomy closure (Right, 06/04/2019); Artery Biopsy (Right, 07/14/2019); Fasciotomy (Right, 05/30/2019); Breast cyst aspiration (Left); Shoulder arthroscopy with rotator cuff repair and subacromial decompression (Right, 02/27/2020); Lower Extremity Angiography (Right, 07/02/2020); and Amputation (Right, 08/11/2020). [name] a current medication list which includes the following prescription(s): alendronate, apixaban, atorvastatin, calcium carbonate, cholecalciferol, duloxetine, gabapentin, losartan, omeprazole, actemra actpen, and zolpidem. She  reports that she quit smoking about 30 years ago. Her smoking use included cigarettes. She has a 50.00 pack-year smoking history. She has never used smokeless tobacco. She reports that she does not drink  alcohol and does not use drugs. [name] allergic to hydrocodone-acetaminophen, amoxicillin, metoprolol tartrate, oxycodone, and vicodin [hydrocodone-acetaminophen].   HPI  Today, she is being contacted for  review thoracic and lumbar spine x-ray results.  Today we reviewed the patient's thoracic and lumbar spine results.  She does have L2-L3 facet disease and degenerative disc disease.  She  has 4 mm retrolisthesis of the L2 and L3.  She she does have lower back pain.  We discussed diagnostic lumbar facet medial branch nerve blocks at L2, L3, L4.  Risk and benefits reviewed and patient would like to proceed.  She is currently working with physical therapy.  Laboratory Chemistry Profile   Renal Lab Results  Component Value Date   BUN 15 06/24/2021   CREATININE 0.80 06/24/2021   BCR 13 09/21/2015   GFRAA >60 02/18/2020   GFRNONAA >60 06/24/2021    Hepatic Lab Results  Component Value Date   AST 26 06/24/2021   ALT 23 06/24/2021   ALBUMIN 3.9 06/24/2021   ALKPHOS 54 06/24/2021    Electrolytes Lab Results  Component Value Date   NA 136 06/24/2021   K 3.8 06/24/2021   CL 102 06/24/2021   CALCIUM 8.7 (L) 06/24/2021   MG 1.9 07/03/2020   PHOS 3.6 06/02/2019    Bone Lab Results  Component Value Date   VD25OH 45.8 09/21/2015    Inflammation (CRP: Acute Phase) (ESR: Chronic Phase) Lab Results  Component Value Date   ESRSEDRATE 58 (H) 07/03/2019   LATICACIDVEN 0.8 06/01/2019         Note: Above Lab results reviewed.  Imaging  DG Lumbar Spine Complete W/Bend CLINICAL DATA:  Chronic mid and lower back pain x2 years.  EXAM: LUMBAR SPINE - COMPLETE WITH BENDING VIEWS  COMPARISON:  April 20, 2016  FINDINGS: There is no evidence of an acute lumbar spine fracture. Chronic compression fracture deformities are seen at the levels of T11, L1 and L3. Approximate 4 mm retrolisthesis of the L2 vertebral body is noted on L3. Mild to moderate severity endplate sclerosis is  seen at the level of L1-L2. Marked severity intervertebral disc space narrowing is seen at L2-L3.  IMPRESSION: 1. Chronic compression fracture deformities at the levels of T11, L1 and L3. 2. 4 mm retrolisthesis of the L2 vertebral body on L3. 3. Marked degenerative disc disease at L2-L3.  Electronically Signed   By: Virgina Norfolk M.D.   On: 02/24/2022 00:57 DG Thoracic Spine 2 View CLINICAL DATA:  Chronic mid and lower back pain x2 years.  EXAM: THORACIC SPINE 2 VIEWS  COMPARISON:  None Available.  FINDINGS: There is no evidence of an acute thoracic spine fracture. A chronic compression fracture deformity is seen at the level of T11. There is mild dextroscoliosis of the lower thoracic and upper lumbar spine. Mild multilevel endplate sclerosis is seen with mild multilevel intervertebral disc space narrowing.  IMPRESSION: 1. Chronic compression fracture deformity at T11. 2. Mild multilevel degenerative changes. 3. Mild dextroscoliosis of the lower thoracic and upper lumbar spine.  Electronically Signed   By: Virgina Norfolk M.D.   On: 02/24/2022 00:50  Assessment  The primary encounter diagnosis was Lumbar facet arthropathy. 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  Plan of [place] has a history of greater than 3 months of moderate to severe pain which is resulted in functional impairment.  The patient has tried various conservative therapeutic options such as NSAIDs, Tylenol, muscle relaxants, physical therapy which was inadequately effective.  Patient's pain is predominantly axial with physical exam and xray findings suggestive of facet  arthropathy. Lumbar  facet medial branch nerve blocks were discussed with the patient.  Risks and benefits were reviewed.  Patient would like to proceed with bilateral L2,3,4 medial branch nerve block.  Of note, patient will need cardiac clearance to stop her Eliquis 3 days prior.   Orders:  Orders Placed This Encounter  Procedures   LUMBAR FACET(MEDIAL BRANCH NERVE BLOCK) MBNB    Standing Status:   Future    Standing Expiration Date:   04/07/2022    Scheduling Instructions:     Bilateral L2, L3, L4 lumbar facet medial branch nerve block     Patient will need cardiac clearance to stop Eliquis 3 days prior.    Order Specific Question:   Where will this procedure be performed?    Answer:   [facility]   Follow-up plan:   Return in about 2 weeks (around 03/21/2022) for B/L L2,3,4  MBNB.     1.  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  Discussed treatment options which include titration of gabapentin to a more therapeutic dose.  Can also consider trial of Lyrica in future.  2.  Consider lidocaine infusion for right lower extremity phantom limb pain.  QTc checked from EKG in November 2022 and is appropriate.  Will need updated EKG if we are considering lidocaine infusion.  3.  Consider spinal cord stimulation for phantom limb pain.  Informed patient that there is mixed evidence on this but this could be a treatment option.  4.  Right rotator cuff arthropathy and dysfunction: Consider right suprascapular nerve block and possible RFA or suprascapular peripheral nerve stimulation.    Recent Visits Date Type Provider Dept  02/16/22 Office Visit Gillis Santa, MD [facility]  12/27/21 Office Visit Gillis Santa, MD [facility]  Showing recent visits within past 90 days and meeting all other requirements Today's Visits Date Type Provider Dept  03/07/22 Office Visit Gillis Santa, MD [facility]  Showing today's visits and meeting  all other requirements Future Appointments No visits were found meeting these conditions. Showing future appointments within next 90 days and meeting all other requirements  I discussed the assessment and treatment plan with the patient. The patient was provided an opportunity to ask questions and all were answered. The patient agreed with the plan and demonstrated an understanding of the instructions.  Patient advised to call back or seek an in-person evaluation if the symptoms or condition worsens.  Duration of encounter: 30 minutes.  Note by: Gillis Santa, MD Date: 03/07/2022; Time: 4:20 PM

## 2022-03-08 ENCOUNTER — Telehealth: Payer: Self-pay

## 2022-03-08 NOTE — Therapy (Signed)
OUTPATIENT PHYSICAL THERAPY SHOULDER/BACK TREATMENT   Patient Name: Sara [name] MRN: [id number] DOB:1951/06/19, 71 y.o., female Today's Date: 03/09/2022   PT End of Session - 03/09/22 0922     Visit Number 5    Number of Visits 25    Date for PT Re-Evaluation 05/09/22    Authorization Type eval: 02/14/22    PT Start Time 0847    PT Stop Time 0930    PT Time Calculation (min) 43 min    Activity Tolerance Patient tolerated treatment well    Behavior During Therapy [facility] for tasks assessed/performed                Past Medical History:  Diagnosis Date   Arthritis    right shoulder   Depression    Dyspnea    GERD (gastroesophageal reflux disease)    History of blood clots    Hyperlipidemia    Hypertension    Mild mitral regurgitation    Osteoporosis    Peripheral vascular disease ([place])    Stomach ulcer    Vascular disease    Sees Dr. Delana Meyer   Vertigo    Last episode approx Aug 2015   Wears dentures    full upper   Past Surgical History:  Procedure Laterality Date   ADENOIDECTOMY     AMPUTATION Right 08/11/2020   Procedure: AMPUTATION ABOVE KNEE;  Surgeon: [name], MD;  Location: [facility];  Service: Vascular;  Laterality: Right;   ARTERY BIOPSY Right 07/14/2019   Procedure: BIOPSY TEMPORAL ARTERY;  Surgeon: [name], MD;  Location: [facility];  Service: Vascular;  Laterality: Right;   BREAST CYST ASPIRATION Left    CARDIAC CATHETERIZATION  02/15/2017   [facility]   COLONOSCOPY WITH PROPOFOL N/A 10/22/2017   Procedure: COLONOSCOPY WITH PROPOFOL;  Surgeon: Lucilla Lame, MD;  Location: [place];  Service: Endoscopy;  Laterality: N/A;  specimens not taken--pt on Plavix will be brought back in after 7 days off med   COLONOSCOPY WITH PROPOFOL N/A 11/05/2017   Procedure: COLONOSCOPY WITH PROPOFOL;  Surgeon: Lucilla Lame, MD;  Location: [place];  Service: Endoscopy;  Laterality: N/A;   ESOPHAGOGASTRODUODENOSCOPY N/A 12/21/2014    Procedure: ESOPHAGOGASTRODUODENOSCOPY (EGD);  Surgeon: Lucilla Lame, MD;  Location: [place];  Service: Gastroenterology;  Laterality: N/A;   ESOPHAGOGASTRODUODENOSCOPY (EGD) WITH PROPOFOL N/A 02/08/2016   Procedure: ESOPHAGOGASTRODUODENOSCOPY (EGD) WITH PROPOFOL;  Surgeon: Lucilla Lame, MD;  Location: [facility];  Service: Endoscopy;  Laterality: N/A;   FASCIOTOMY Right 05/30/2019   Procedure: FASCIOTOMY;  Surgeon: [name], MD;  Location: [facility];  Service: Vascular;  Laterality: Right;   FASCIOTOMY CLOSURE Right 06/04/2019   Procedure: FASCIOTOMY CLOSURE;  Surgeon: [name], MD;  Location: [facility];  Service: Vascular;  Laterality: Right;   HEMORROIDECTOMY  2014   LOWER EXTREMITY ANGIOGRAPHY Right 05/30/2019   Procedure: LOWER EXTREMITY ANGIOGRAPHY;  Surgeon: [name], MD;  Location: [place] [facility];  Service: Cardiovascular;  Laterality: Right;   LOWER EXTREMITY ANGIOGRAPHY Right 07/02/2020   Procedure: LOWER EXTREMITY ANGIOGRAPHY;  Surgeon: [name], MD;  Location: [place] [facility];  Service: Cardiovascular;  Laterality: Right;   PERIPHERAL VASCULAR CATHETERIZATION N/A 02/09/2016   Procedure: Abdominal Aortogram w/Lower Extremity;  Surgeon: [name], MD;  Location: [place] [facility];  Service: Cardiovascular;  Laterality: N/A;   PERIPHERAL VASCULAR CATHETERIZATION Right 02/10/2016   Procedure: Lower Extremity Angiography;  Surgeon: [name], MD;  Location:  [place] [facility];  Service: Cardiovascular;  Laterality: Right;   POLYPECTOMY  11/05/2017   Procedure: POLYPECTOMY;  Surgeon: Lucilla Lame, MD;  Location: [place];  Service: Endoscopy;;   SHOULDER ARTHROSCOPY WITH ROTATOR CUFF REPAIR AND SUBACROMIAL DECOMPRESSION Right 02/27/2020   Procedure: RIGHT SHOULDER ARTHROSCOPY SUBACROMIAL DECOMPRESSION, DISTAL CLAVICLE EXCISION AND MINI-OPEN ROTATOR CUFF REPAIR;  Surgeon: Thornton Park, MD;  Location:  [facility];  Service: Orthopedics;  Laterality: Right;   TONSILLECTOMY     VASCULAR SURGERY  [id number]   Fem-Pop Bypass   Patient Active Problem List   Diagnosis Date Noted   Degeneration of lumbar intervertebral disc 01/04/2022   Shoulder pain 01/04/2022   Pain in joint of right shoulder 01/04/2022   Spasm of back muscles 01/04/2022   Thoracic back pain 01/04/2022   History of left above knee amputation [facility]) (Dec 2021) 12/27/2021   Rotator cuff arthropathy of right shoulder 12/27/2021   Hx of rotator cuff surgery 12/27/2021   Chronic pain syndrome 12/27/2021   Adhesive capsulitis of right shoulder 12/05/2021   Phantom pain ([place]) 11/10/2021   Muscle spasm 09/09/2020   S/P AKA (above knee amputation), right ([place]) 09/08/2020   Ischemia of extremity 07/02/2020   Chronic pain in right shoulder 11/25/2019   Encounter for long-term (current) use of high-risk medication 07/16/2019   GCA (giant cell arteritis) ([place]) 07/16/2019   Temporal arteritis ([place]) 07/13/2019   Postoperative wound infection 06/23/2019   Ischemic leg 05/31/2019   Atherosclerosis of native arteries of extremity with intermittent claudication ([place]) 05/30/2019   Depression 05/29/2019   Eczema of lower extremity 03/04/2018   Chronic venous insufficiency 03/02/2018   Personal history of colonic polyps    Family history of colonic polyps    Benign neoplasm of ascending colon    Mitral valve insufficiency 02/08/2017   Dyspnea on exertion 02/07/2017   Non-rheumatic mitral regurgitation 02/07/2017   Precordial pain 02/07/2017   CAD (coronary artery disease) 12/21/2016   Essential hypertension 12/21/2016   Compression fracture of lumbar spine, non-traumatic ([place]) 04/26/2016   Compression fracture of L3 lumbar vertebra 04/20/2016   PVD (peripheral vascular disease) ([place]) 02/24/2016   Family history of premature CAD 02/24/2016   Gastritis    Other specified diseases of esophagus    Hiatal hernia    Gastritis and  gastroduodenitis    Ischemia of lower extremity    Arterial occlusion ([place])    Atherosclerosis of aorta ([place])    History of smoking    Hyperlipidemia    Pain in the chest    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 02/05/2016   Carotid artery stenosis 01/04/2016   Vertigo, benign paroxysmal 12/28/2015   Clinical depression 09/06/2015   History of alcoholism ([place]) 09/06/2015   Angiopathy, 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[place]) 09/06/2015   Atherosclerosis of native arteries of extremity with rest pain ([place]) 09/06/2015   Acute non-recurrent maxillary sinusitis 07/14/2015   Vaginal pruritus 05/26/2015   Chronic recurrent major depressive disorder ([place]) 03/09/2015   Osteoporosis, post-menopausal 03/09/2015   Peripheral blood vessel disorder ([place]) 03/09/2015   Acid reflux 03/09/2015   GERD (gastroesophageal reflux disease) 12/21/2014   Carotid artery narrowing 01/28/2014   Peripheral arterial occlusive disease ([place]) 06/08/2011   Occlusion and stenosis of unspecified carotid artery 06/08/2011   PAD (peripheral artery disease) ([place]) 06/08/2011    PCP: Danelle Berry, NP  REFERRING PROVIDER: Barrie Lyme MD  REFERRING DIAGNOSIS: S29.012A (ICD-10-CM) - Strain of muscle and tendon of back wall of thorax,  initial encounter  THERAPY DIAG: Other low back pain  Muscle weakness (generalized)  RATIONALE FOR EVALUATION AND TREATMENT: Rehabilitation  ONSET DATE: 08/11/20 (approximate)  FROM INITIAL EVALUATION  SUBJECTIVE:                                                                                                                                                                                         Chief Complaint: R thoracic/rib pain  Pertinent History Pt reports she she started experiencing R shoulder pain which started shortly after her RLE amputation (08/11/20). She attributes the pain to having to self-propel a wheelchair for an extended period of time. She has seen Dr.  Mack Guise at [facility] in the past and underwent a R shoulder mini-open rotator cuff repair, DCE, and SAD on 02/27/20. Since the pain started she has undergone R shoulder injections without improvement in her pain. When pt describes her pain she points to the area below her R shoulder blade around T10/T11. She denies weakness in the RUE or any pain in the area of the [facility] joint or shoulder pain. Denies N/T in back or RUE. Pain worsens with extended use of RUE especially when cleaning the counters in her apartment or sweeping the floor. She has a history of L1 and L3 compression fractures in 2017. She currently sees pain management for her R shoulder pain and takes oxycodone to manage the pain. She also struggles with chronic RLE phantom pain s/p amputation and takes gabapentin for this issue. She has a history of multiple vascular surgeries prior to her amputation and also has a history of carpal tunnel syndrome.  Pain:  Pain Intensity: Not rated today Pain location: R back/flank inferior to the shoulder blade Pain Quality: sharp  Radiating: No  Numbness/Tingling: No Focal Weakness: No Aggravating factors: Excessive use of RUE, mopping the floors, wiping countertops, no pain with driving, Relieving factors: heating pad, never tried lidocaine patch, oxycodone, gabapentin doesn't help with R flank/back pain; 24-hour pain behavior: varies depending on activity History of prior shoulder or neck/shoulder injury, pain, surgery, or therapy: Yes Falls: Has patient fallen in last 6 months? 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  Recent memory is intact.  Remote memory is intact.  Attention span and concentration are intact.  Expressive speech is intact.  Patient's fund of knowledge is within normal limits for educational level.    Gross Musculoskeletal Assessment Tremor: None Bulk: Normal Tone: Normal  Gait Pt ambulates with RLE prosthesis and lofstrand in LUE, decreased self-selected gait speed;  Posture Increased thoracic kyphosis, forward head, and rounded shoulder in sitting and standing;  Cervical Screen AROM: WFL and painless with overpressure in all planes Spurlings A (ipsilateral lateral flexion/axial compression): R: Negative L: Negative Spurlings B (ipsilateral lateral flexion/contralateral rotation/axial compression): R: Negative L: Negative Repeated movement: Not examined Hoffman Sign (cervical cord compression): R: Not examined L: Not examined ULTT Median: R: Not examined L: Not examined ULTT Ulnar: R: Not examined L: Not examined ULTT Radial: R: Not examined L: Not examined   AROM Cervical AROM is painless in all directions and WFL although not full. No reproduction of pain with AROM R shoulder flexion and abduction which is also limited but WFL. No gross deficits noted. Pt denies pain with thoracic flexion, extension, rotation, and lateral flexion.    LE MMT:  MMT (out of 5) Right 03/09/2022 Left 03/09/2022  Cervical (isometric)  Flexion WNL  Extension WNL  Lateral Flexion WNL WNL  Rotation WNL WNL      Shoulder   Flexion 4+ 4+  Extension    Abduction 4+ 4+  External rotation (seated) 4+ 4+   Internal rotation (seated) 4+ 4+  Horizontal abduction    Horizontal adduction    Lower Trapezius    Rhomboids        Elbow  Flexion 5 5  Extension 5 5  Pronation    Supination        Wrist  Flexion 5 5  Extension 5 5  Radial deviation    Ulnar deviation        MCP  Flexion    Extension    Abduction 5 5  Adduction 5 5  (* = pain; Blank rows = not tested)  Sensation Grossly intact to light touch bilateral UE as determined by testing dermatomes C2-T2. Proprioception and hot/cold testing deferred on this date.  Reflexes Deferred   Palpation Pt is nontender to palpation around entire R shoulder girdle anterior, lateral, and posterior. No pain with palpation to R cervical paraspinals, upper trap, rhomboids, or mid trap. She has pain to palpation along spinous processes from T9-L3 as well as along paraspinals in this region on the right.   Repeated Movements Deferred  Passive Accessory Intervertebral Motion Pt reports reproduction of pain with CPA and bilateral UPA T9-L3. Unable to fully assess mobility secondary to pain however appear grossly hypomobile with notable thoracic kyphosis when prone.   SPECIAL TESTS Rotator Cuff  Drop Arm Test: Negative Painful Arc (Pain from 60 to 120 degrees scaption): Negative Infraspinatus Muscle Test: Negative   Subacromial Impingement Hawkins-Kennedy: Negative Neer (Block scapula, PROM flexion): Negative Painful Arc (Pain from 60 to 120 degrees scaption): Negative Empty Can: Negative External Rotation Resistance: Negative Horizontal Adduction: Not examined Scapular Assist: Not examined   Labral Tear Biceps Load II (120 elevation, full ER, 90 elbow flexion, full supination, resisted elbow flexion): Not examined Crank (160 scaption, axial load with IR/ER): Not examined Active Compression Test: Not  examined  Bicep Tendon Pathology Speed (shoulder flexion to 90, external rotation, full elbow extension, and forearm  supination with resistance: Not examined Yergason's (resisted shoulder ER and supination/biceps tendon pathology): Not examined  Shoulder Instability Sulcus Sign: Not examined Anterior Apprehension: Not examined  Beighton scale: Deferred   Outcome Measures Quick DASH: Deferred FOTO: Pt completed shoulder FOTO but this appears to be more related to back pain. Will update at next therapy session.    TODAY'S TREATMENT (03/09/22)   SUBJECTIVE: Pt denies current pain in R mid back upon arrival at rest. No specific questions or concerns currently. She saw Dr. Holley Raring and is scheduled for back injections in early August. No specific concerns currently.   PAIN: Denies   Ther-ex: Prone alternating hip extension x 10 BLE; Seated shoulder flexion with 2# dumbbell 2 x 10; Seated shoulder scaption with 2# dumbbell 2 x 10; Seated scapular retractions x 10; Seated rows with green tband 2 x 10 BUE;   Manual Therapy  Extensive STM to R thoracic/upper lumbar erector spinae group and latissimus x 10 minutes;   Electrical Stimulation  IFC to R lower thoracic/upper lumbar spine over area of discomfort with [employer] unit using default settings at pt tolerated intensity (11.5) x 15 minutes with concurrent moist heat pack;    PATIENT EDUCATION:  Education details: form/technique with exercise Person educated: Patient Education method: Explanation Education comprehension: verbalized understanding   HOME EXERCISE PROGRAM: None currently   ASSESSMENT:  CLINICAL IMPRESSION: Utilized electrical stimulation during session again today as well as extensive STM to improve muscle extensibility and decrease pain. Hip and upper quarter strengthening repeated again during session. Overall pt is reporting improvement in her pain with therapy. Pt encouraged to follow-up as scheduled. Pt will benefit from PT services to address deficits in strength and pain in order to return to full function at  home with less pain.    REHAB POTENTIAL: Fair    CLINICAL DECISION MAKING: Unstable/unpredictable  EVALUATION COMPLEXITY: High   GOALS: Goals reviewed with patient? Yes  SHORT TERM GOALS: Target date:  03/14/2022  Pt will be independent with HEP to improve strength and decrease back pain to improve pain-free function at home. Baseline:  Goal status: INITIAL   LONG TERM GOALS: Target date: 04/11/2022  Pt will increase FOTO to at least 57 in order to demonstrate significant improvement in function at home and work related to back pain  Baseline: 02/14/22: To be completed at next visit; 02/22/22: 45 Goal status: INITIAL  2.  Pt will decrease worst back pain by at least 2 points on the NPRS in order to demonstrate clinically significant reduction in back pain. Baseline: 02/14/22: To be completed at next visit Goal status: INITIAL  3.  Pt will report at least 50% improvement in her back symptoms in order to demonstrate clinically significant reduction in disability related to back pain;        Baseline:  Goal status: INITIAL   PLAN: PT FREQUENCY: 1-2x/week  PT DURATION: 8 weeks  PLANNED INTERVENTIONS: Therapeutic exercises, Therapeutic activity, Neuromuscular re-education, Balance training, Gait training, Patient/Family education, Joint manipulation, Joint mobilization, Vestibular training, Canalith repositioning, Aquatic Therapy, Dry Needling, Electrical stimulation, Spinal manipulation, Spinal mobilization, Cryotherapy, Moist heat, Traction, Ultrasound, Ionotophoresis '4mg'$ /ml Dexamethasone, and Manual therapy  PLAN FOR NEXT SESSION: Continue manual techniques for R sided back pain, assess response to TDN, continue stretching/strengthening;    Phillips Grout PT, DPT, GCS  Physical Therapist- [facility]  03/09/2022, 1:05 PM

## 2022-03-08 NOTE — Telephone Encounter (Signed)
Dr Holley Raring would like clearance for this patient to stop her Eliquis for 3 days prior to Lumbar Facet Block. Thank you

## 2022-03-08 NOTE — Telephone Encounter (Signed)
Patient is waiting for cardiac clearance to stop her Eliquis for 3 days prior to her procedure.   I have sent him a request.

## 2022-03-09 ENCOUNTER — Ambulatory Visit: Payer: Medicare Other

## 2022-03-09 DIAGNOSIS — M5459 Other low back pain: Secondary | ICD-10-CM

## 2022-03-09 DIAGNOSIS — M6281 Muscle weakness (generalized): Secondary | ICD-10-CM

## 2022-03-09 DIAGNOSIS — M25511 Pain in right shoulder: Secondary | ICD-10-CM | POA: Diagnosis not present

## 2022-03-09 DIAGNOSIS — G8929 Other chronic pain: Secondary | ICD-10-CM | POA: Diagnosis not present

## 2022-03-12 NOTE — Therapy (Signed)
OUTPATIENT PHYSICAL THERAPY SHOULDER/BACK TREATMENT   Patient Name: Sara [name] MRN: [id number] DOB:07/28/1951, 71 y.o., female Today's Date: 03/13/2022   PT End of Session - 03/13/22 1141     Visit Number 6    Number of Visits 25    Date for PT Re-Evaluation 05/09/22    Authorization Type eval: 02/14/22    PT Start Time 6767    PT Stop Time 1230    PT Time Calculation (min) 45 min    Activity Tolerance Patient tolerated treatment well    Behavior During Therapy [facility] for tasks assessed/performed                 Past Medical History:  Diagnosis Date   Arthritis    right shoulder   Depression    Dyspnea    GERD (gastroesophageal reflux disease)    History of blood clots    Hyperlipidemia    Hypertension    Mild mitral regurgitation    Osteoporosis    Peripheral vascular disease ([place])    Stomach ulcer    Vascular disease    Sees Dr. Delana Meyer   Vertigo    Last episode approx Aug 2015   Wears dentures    full upper   Past Surgical History:  Procedure Laterality Date   ADENOIDECTOMY     AMPUTATION Right 08/11/2020   Procedure: AMPUTATION ABOVE KNEE;  Surgeon: [name], MD;  Location: [facility];  Service: Vascular;  Laterality: Right;   ARTERY BIOPSY Right 07/14/2019   Procedure: BIOPSY TEMPORAL ARTERY;  Surgeon: [name], MD;  Location: [facility];  Service: Vascular;  Laterality: Right;   BREAST CYST ASPIRATION Left    CARDIAC CATHETERIZATION  02/15/2017   [facility]   COLONOSCOPY WITH PROPOFOL N/A 10/22/2017   Procedure: COLONOSCOPY WITH PROPOFOL;  Surgeon: Lucilla Lame, MD;  Location: [place];  Service: Endoscopy;  Laterality: N/A;  specimens not taken--pt on Plavix will be brought back in after 7 days off med   COLONOSCOPY WITH PROPOFOL N/A 11/05/2017   Procedure: COLONOSCOPY WITH PROPOFOL;  Surgeon: Lucilla Lame, MD;  Location: [place];  Service: Endoscopy;  Laterality: N/A;   ESOPHAGOGASTRODUODENOSCOPY N/A 12/21/2014    Procedure: ESOPHAGOGASTRODUODENOSCOPY (EGD);  Surgeon: Lucilla Lame, MD;  Location: [place];  Service: Gastroenterology;  Laterality: N/A;   ESOPHAGOGASTRODUODENOSCOPY (EGD) WITH PROPOFOL N/A 02/08/2016   Procedure: ESOPHAGOGASTRODUODENOSCOPY (EGD) WITH PROPOFOL;  Surgeon: Lucilla Lame, MD;  Location: [facility];  Service: Endoscopy;  Laterality: N/A;   FASCIOTOMY Right 05/30/2019   Procedure: FASCIOTOMY;  Surgeon: [name], MD;  Location: [facility];  Service: Vascular;  Laterality: Right;   FASCIOTOMY CLOSURE Right 06/04/2019   Procedure: FASCIOTOMY CLOSURE;  Surgeon: [name], MD;  Location: [facility];  Service: Vascular;  Laterality: Right;   HEMORROIDECTOMY  2014   LOWER EXTREMITY ANGIOGRAPHY Right 05/30/2019   Procedure: LOWER EXTREMITY ANGIOGRAPHY;  Surgeon: [name], MD;  Location: [place] [facility];  Service: Cardiovascular;  Laterality: Right;   LOWER EXTREMITY ANGIOGRAPHY Right 07/02/2020   Procedure: LOWER EXTREMITY ANGIOGRAPHY;  Surgeon: [name], MD;  Location: [place] [facility];  Service: Cardiovascular;  Laterality: Right;   PERIPHERAL VASCULAR CATHETERIZATION N/A 02/09/2016   Procedure: Abdominal Aortogram w/Lower Extremity;  Surgeon: [name], MD;  Location: [place] [facility];  Service: Cardiovascular;  Laterality: N/A;   PERIPHERAL VASCULAR CATHETERIZATION Right 02/10/2016   Procedure: Lower Extremity Angiography;  Surgeon: [name], MD;  Location: [place] [facility];  Service: Cardiovascular;  Laterality: Right;   POLYPECTOMY  11/05/2017   Procedure: POLYPECTOMY;  Surgeon: Lucilla Lame, MD;  Location: [place];  Service: Endoscopy;;   SHOULDER ARTHROSCOPY WITH ROTATOR CUFF REPAIR AND SUBACROMIAL DECOMPRESSION Right 02/27/2020   Procedure: RIGHT SHOULDER ARTHROSCOPY SUBACROMIAL DECOMPRESSION, DISTAL CLAVICLE EXCISION AND MINI-OPEN ROTATOR CUFF REPAIR;  Surgeon: [place], MD;  Location:  [facility];  Service: Orthopedics;  Laterality: Right;   TONSILLECTOMY     VASCULAR SURGERY  6045,4098   Fem-Pop Bypass   Patient Active Problem List   Diagnosis Date Noted   Degeneration of lumbar intervertebral disc 01/04/2022   Shoulder pain 01/04/2022   Pain in joint of right shoulder 01/04/2022   Spasm of back muscles 01/04/2022   Thoracic back pain 01/04/2022   History of left above knee amputation [facility]) (Dec 2021) 12/27/2021   Rotator cuff arthropathy of right shoulder 12/27/2021   Hx of rotator cuff surgery 12/27/2021   Chronic pain syndrome 12/27/2021   Adhesive capsulitis of right shoulder 12/05/2021   Phantom pain ([place]) 11/10/2021   Muscle spasm 09/09/2020   S/P AKA (above knee amputation), right ([place]) 09/08/2020   Ischemia of extremity 07/02/2020   Chronic pain in right shoulder 11/25/2019   Encounter for long-term (current) use of high-risk medication 07/16/2019   GCA (giant cell arteritis) ([place]) 07/16/2019   Temporal arteritis ([place]) 07/13/2019   Postoperative wound infection 06/23/2019   Ischemic leg 05/31/2019   Atherosclerosis of native arteries of extremity with intermittent claudication ([place]) 05/30/2019   Depression 05/29/2019   Eczema of lower extremity 03/04/2018   Chronic venous insufficiency 03/02/2018   Personal history of colonic polyps    Family history of colonic polyps    Benign neoplasm of ascending colon    Mitral valve insufficiency 02/08/2017   Dyspnea on exertion 02/07/2017   Non-rheumatic mitral regurgitation 02/07/2017   Precordial pain 02/07/2017   CAD (coronary artery disease) 12/21/2016   Essential hypertension 12/21/2016   Compression fracture of lumbar spine, non-traumatic ([facility]) 04/26/2016   Compression fracture of L3 lumbar vertebra 04/20/2016   PVD (peripheral vascular disease) ([place]) 02/24/2016   Family history of premature CAD 02/24/2016   Gastritis    Other specified diseases of esophagus    Hiatal hernia    Gastritis and  gastroduodenitis    Ischemia of lower extremity    Arterial occlusion ([place])    Atherosclerosis of aorta ([place])    History of smoking    Hyperlipidemia    Pain in the chest    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 02/05/2016   Carotid artery stenosis 01/04/2016   Vertigo, benign paroxysmal 12/28/2015   Clinical depression 09/06/2015   History of alcoholism ([place]) 09/06/2015   Angiopathy, 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[place]) 09/06/2015   Atherosclerosis of native arteries of extremity with rest pain ([place]) 09/06/2015   Acute non-recurrent maxillary sinusitis 07/14/2015   Vaginal pruritus 05/26/2015   Chronic recurrent major depressive disorder ([place]) 03/09/2015   Osteoporosis, post-menopausal 03/09/2015   Peripheral blood vessel disorder ([place]) 03/09/2015   Acid reflux 03/09/2015   GERD (gastroesophageal reflux disease) 12/21/2014   Carotid artery narrowing 01/28/2014   Peripheral arterial occlusive disease ([place]) 06/08/2011   Occlusion and stenosis of unspecified carotid artery 06/08/2011   PAD (peripheral artery disease) ([place]) 06/08/2011    PCP: Danelle Berry, NP  REFERRING PROVIDER: Barrie Lyme MD  REFERRING DIAGNOSIS: S29.012A (ICD-10-CM) - Strain of muscle and tendon of back wall of  thorax, initial encounter  THERAPY DIAG: Other low back pain  Chronic right shoulder pain  Muscle weakness (generalized)  RATIONALE FOR EVALUATION AND TREATMENT: Rehabilitation  ONSET DATE: 08/11/20 (approximate)  FROM INITIAL EVALUATION  SUBJECTIVE:                                                                                                                                                                                         Chief Complaint: R thoracic/rib pain  Pertinent History Pt reports she she started experiencing R shoulder pain which started shortly after her RLE amputation (08/11/20). She attributes the pain to having to self-propel a wheelchair for an extended  period of time. She has seen Dr. Mack Guise at [facility] in the past and underwent a R shoulder mini-open rotator cuff repair, DCE, and SAD on 02/27/20. Since the pain started she has undergone R shoulder injections without improvement in her pain. When pt describes her pain she points to the area below her R shoulder blade around T10/T11. She denies weakness in the RUE or any pain in the area of the [facility] joint or shoulder pain. Denies N/T in back or RUE. Pain worsens with extended use of RUE especially when cleaning the counters in her apartment or sweeping the floor. She has a history of L1 and L3 compression fractures in 2017. She currently sees pain management for her R shoulder pain and takes oxycodone to manage the pain. She also struggles with chronic RLE phantom pain s/p amputation and takes gabapentin for this issue. She has a history of multiple vascular surgeries prior to her amputation and also has a history of carpal tunnel syndrome.  Pain:  Pain Intensity: Not rated today Pain location: R back/flank inferior to the shoulder blade Pain Quality: sharp  Radiating: No  Numbness/Tingling: No Focal Weakness: No Aggravating factors: Excessive use of RUE, mopping the floors, wiping countertops, no pain with driving, Relieving factors: heating pad, never tried lidocaine patch, oxycodone, gabapentin doesn't help with R flank/back pain; 24-hour pain behavior: varies depending on activity History of prior shoulder or neck/shoulder injury, pain, surgery, or therapy: Yes Falls: Has patient fallen in last 6 months? 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  Recent memory is intact.  Remote memory is intact.  Attention span and concentration are intact.  Expressive speech is intact.  Patient's fund of knowledge is within normal limits for educational level.    Gross Musculoskeletal Assessment Tremor: None Bulk: Normal Tone: Normal  Gait Pt ambulates with RLE prosthesis and lofstrand in LUE, decreased self-selected gait speed;  Posture Increased thoracic kyphosis, forward head, and rounded shoulder in sitting and standing;  Cervical Screen AROM: WFL and painless with overpressure in all planes Spurlings A (ipsilateral lateral flexion/axial compression): R: Negative L: Negative Spurlings B (ipsilateral lateral flexion/contralateral rotation/axial compression): R: Negative L: Negative Repeated movement: Not examined Hoffman Sign (cervical cord compression): R: Not examined L: Not examined ULTT Median: R: Not examined L: Not examined ULTT Ulnar: R: Not examined L: Not examined ULTT Radial: R: Not examined L: Not examined   AROM Cervical AROM is painless in all directions and WFL although not full. No reproduction of pain with AROM R shoulder flexion and abduction which is also limited but WFL. No gross deficits noted. Pt denies pain with thoracic flexion, extension, rotation, and lateral flexion.    LE MMT:  MMT (out of 5) Right 03/13/2022 Left 03/13/2022  Cervical (isometric)  Flexion WNL  Extension WNL  Lateral Flexion WNL WNL  Rotation WNL WNL      Shoulder   Flexion 4+ 4+  Extension    Abduction 4+ 4+   External rotation (seated) 4+ 4+  Internal rotation (seated) 4+ 4+  Horizontal abduction    Horizontal adduction    Lower Trapezius    Rhomboids        Elbow  Flexion 5 5  Extension 5 5  Pronation    Supination        Wrist  Flexion 5 5  Extension 5 5  Radial deviation    Ulnar deviation        MCP  Flexion    Extension    Abduction 5 5  Adduction 5 5  (* = pain; Blank rows = not tested)  Sensation Grossly intact to light touch bilateral UE as determined by testing dermatomes C2-T2. Proprioception and hot/cold testing deferred on this date.  Reflexes Deferred   Palpation Pt is nontender to palpation around entire R shoulder girdle anterior, lateral, and posterior. No pain with palpation to R cervical paraspinals, upper trap, rhomboids, or mid trap. She has pain to palpation along spinous processes from T9-L3 as well as along paraspinals in this region on the right.   Repeated Movements Deferred  Passive Accessory Intervertebral Motion Pt reports reproduction of pain with CPA and bilateral UPA T9-L3. Unable to fully assess mobility secondary to pain however appear grossly hypomobile with notable thoracic kyphosis when prone.   SPECIAL TESTS Rotator Cuff  Drop Arm Test: Negative Painful Arc (Pain from 60 to 120 degrees scaption): Negative Infraspinatus Muscle Test: Negative   Subacromial Impingement Hawkins-Kennedy: Negative Neer (Block scapula, PROM flexion): Negative Painful Arc (Pain from 60 to 120 degrees scaption): Negative Empty Can: Negative External Rotation Resistance: Negative Horizontal Adduction: Not examined Scapular Assist: Not examined   Labral Tear Biceps Load II (120 elevation, full ER, 90 elbow flexion, full supination, resisted elbow flexion): Not examined Crank (160 scaption, axial load with IR/ER): Not  examined Active Compression Test: Not examined  Bicep Tendon Pathology Speed (shoulder flexion to 90, external rotation, full  elbow extension, and forearm supination with resistance: Not examined Yergason's (resisted shoulder ER and supination/biceps tendon pathology): Not examined  Shoulder Instability Sulcus Sign: Not examined Anterior Apprehension: Not examined  Beighton scale: Deferred   Outcome Measures Quick DASH: Deferred FOTO: Pt completed shoulder FOTO but this appears to be more related to back pain. Will update at next therapy session.    TODAY'S TREATMENT (03/09/22)   SUBJECTIVE: Pt denies current pain in R mid back upon arrival at rest. No specific questions or concerns currently. Lumbar medial branch block scheduled for next week on 03/22/22. No specific concerns currently.   PAIN: Denies   Ther-ex: Prone alternating hip extension 2 x 10 BLE; Nautilus seated lat pull down 50# x 10, 60# x 10, 70# x 10; Nautilus seated rows 50# x 10, 60# 2 x 10; Seated shoulder flexion with 3# dumbbell 2 x 10; Seated shoulder scaption with 3# dumbbell 2 x 10; Seated scapular retractions 2 x 10; Seated dynamic hug with blue tband 2 x 10;   Manual Therapy  Moist heat pack applied to R thoracic spine/flank with pt in prone at start of session x 5 minutes (unbilled); Extensive STM to R thoracic/upper lumbar erector spinae group and latissimus with effeurage, shingling, and use of Theraband Roller x 10 minutes;    PATIENT EDUCATION:  Education details: form/technique with exercise Person educated: Patient Education method: Explanation Education comprehension: verbalized understanding   HOME EXERCISE PROGRAM: None currently   ASSESSMENT:  CLINICAL IMPRESSION: Deferred electrical stimulation during session again today as pt denies any resting back pain upon arrival. Continued with extensive STM to improve muscle extensibility and overall pt is much less tender to palpation compared to prior sessions. Hip and upper quarter strengthening repeated again during session with progression of resistance,  reps, and sets. Also utilized the [employer] for additional challenge. Pt denies any pain throughout exercises. Overall pt is reporting improvement in her pain with therapy. Pt encouraged to follow-up as scheduled. Pt will benefit from PT services to address deficits in strength and pain in order to return to full function at home with less pain.    REHAB POTENTIAL: Fair    CLINICAL DECISION MAKING: Unstable/unpredictable  EVALUATION COMPLEXITY: High   GOALS: Goals reviewed with patient? Yes  SHORT TERM GOALS: Target date:  03/14/2022  Pt will be independent with HEP to improve strength and decrease back pain to improve pain-free function at home. Baseline:  Goal status: INITIAL   LONG TERM GOALS: Target date: 04/11/2022  Pt will increase FOTO to at least 57 in order to demonstrate significant improvement in function at home and work related to back pain  Baseline: 02/14/22: To be completed at next visit; 02/22/22: 45 Goal status: INITIAL  2.  Pt will decrease worst back pain by at least 2 points on the NPRS in order to demonstrate clinically significant reduction in back pain. Baseline: 02/14/22: To be completed at next visit Goal status: INITIAL  3.  Pt will report at least 50% improvement in her back symptoms in order to demonstrate clinically significant reduction in disability related to back pain;        Baseline:  Goal status: INITIAL   PLAN: PT FREQUENCY: 1-2x/week  PT DURATION: 8 weeks  PLANNED INTERVENTIONS: Therapeutic exercises, Therapeutic activity, Neuromuscular re-education, Balance training, Gait training, Patient/Family education, Joint manipulation, Joint mobilization, Vestibular training, Canalith repositioning,  Aquatic Therapy, Dry Needling, Electrical stimulation, Spinal manipulation, Spinal mobilization, Cryotherapy, Moist heat, Traction, Ultrasound, Ionotophoresis '4mg'$ /ml Dexamethasone, and Manual therapy  PLAN FOR NEXT SESSION: Continue manual  techniques for R sided back pain, assess response to TDN, continue stretching/strengthening;    Phillips Grout PT, DPT, GCS  Physical Therapist- [facility]  03/13/2022, 1:51 PM

## 2022-03-13 ENCOUNTER — Ambulatory Visit: Payer: Medicare Other

## 2022-03-13 DIAGNOSIS — M6281 Muscle weakness (generalized): Secondary | ICD-10-CM | POA: Diagnosis not present

## 2022-03-13 DIAGNOSIS — R945 Abnormal results of liver function studies: Secondary | ICD-10-CM | POA: Diagnosis not present

## 2022-03-13 DIAGNOSIS — G8929 Other chronic pain: Secondary | ICD-10-CM | POA: Diagnosis not present

## 2022-03-13 DIAGNOSIS — M5459 Other low back pain: Secondary | ICD-10-CM

## 2022-03-13 DIAGNOSIS — M25511 Pain in right shoulder: Secondary | ICD-10-CM | POA: Diagnosis not present

## 2022-03-14 NOTE — Therapy (Signed)
OUTPATIENT PHYSICAL THERAPY SHOULDER/BACK TREATMENT   Patient Name: Sara [name] MRN: [id number] DOB:06/01/1951, 71 y.o., female Today's Date: 03/15/2022   PT End of Session - 03/15/22 1138     Visit Number 7    Number of Visits 25    Date for PT Re-Evaluation 05/09/22    Authorization Type eval: 02/14/22    PT Start Time 1145    PT Stop Time 1230    PT Time Calculation (min) 45 min    Activity Tolerance Patient tolerated treatment well    Behavior During Therapy [facility] for tasks assessed/performed             Past Medical History:  Diagnosis Date   Arthritis    right shoulder   Depression    Dyspnea    GERD (gastroesophageal reflux disease)    History of blood clots    Hyperlipidemia    Hypertension    Mild mitral regurgitation    Osteoporosis    Peripheral vascular disease (HCC)    Stomach ulcer    Vascular disease    Sees Dr. Delana Meyer   Vertigo    Last episode approx Aug 2015   Wears dentures    full upper   Past Surgical History:  Procedure Laterality Date   ADENOIDECTOMY     AMPUTATION Right 08/11/2020   Procedure: AMPUTATION ABOVE KNEE;  Surgeon: [name], MD;  Location: [facility];  Service: Vascular;  Laterality: Right;   ARTERY BIOPSY Right 07/14/2019   Procedure: BIOPSY TEMPORAL ARTERY;  Surgeon: [name], MD;  Location: [facility];  Service: Vascular;  Laterality: Right;   BREAST CYST ASPIRATION Left    CARDIAC CATHETERIZATION  02/15/2017   [facility]   COLONOSCOPY WITH PROPOFOL N/A 10/22/2017   Procedure: COLONOSCOPY WITH PROPOFOL;  Surgeon: Lucilla Lame, MD;  Location: [place];  Service: Endoscopy;  Laterality: N/A;  specimens not taken--pt on Plavix will be brought back in after 7 days off med   COLONOSCOPY WITH PROPOFOL N/A 11/05/2017   Procedure: COLONOSCOPY WITH PROPOFOL;  Surgeon: Lucilla Lame, MD;  Location: [place];  Service: Endoscopy;  Laterality: N/A;   ESOPHAGOGASTRODUODENOSCOPY N/A 12/21/2014    Procedure: ESOPHAGOGASTRODUODENOSCOPY (EGD);  Surgeon: Lucilla Lame, MD;  Location: [place];  Service: Gastroenterology;  Laterality: N/A;   ESOPHAGOGASTRODUODENOSCOPY (EGD) WITH PROPOFOL N/A 02/08/2016   Procedure: ESOPHAGOGASTRODUODENOSCOPY (EGD) WITH PROPOFOL;  Surgeon: Lucilla Lame, MD;  Location: [facility];  Service: Endoscopy;  Laterality: N/A;   FASCIOTOMY Right 05/30/2019   Procedure: FASCIOTOMY;  Surgeon: [name], MD;  Location: [facility];  Service: Vascular;  Laterality: Right;   FASCIOTOMY CLOSURE Right 06/04/2019   Procedure: FASCIOTOMY CLOSURE;  Surgeon: [name], MD;  Location: [facility];  Service: Vascular;  Laterality: Right;   HEMORROIDECTOMY  2014   LOWER EXTREMITY ANGIOGRAPHY Right 05/30/2019   Procedure: LOWER EXTREMITY ANGIOGRAPHY;  Surgeon: [name], MD;  Location: [place] [facility];  Service: Cardiovascular;  Laterality: Right;   LOWER EXTREMITY ANGIOGRAPHY Right 07/02/2020   Procedure: LOWER EXTREMITY ANGIOGRAPHY;  Surgeon: [name], MD;  Location: [place] [facility];  Service: Cardiovascular;  Laterality: Right;   PERIPHERAL VASCULAR CATHETERIZATION N/A 02/09/2016   Procedure: Abdominal Aortogram w/Lower Extremity;  Surgeon: [name], MD;  Location: [place] [facility];  Service: Cardiovascular;  Laterality: N/A;   PERIPHERAL VASCULAR CATHETERIZATION Right 02/10/2016   Procedure: Lower Extremity Angiography;  Surgeon: [name], MD;  Location: [facility]  [facility];  Service: Cardiovascular;  Laterality: Right;   POLYPECTOMY  11/05/2017   Procedure: POLYPECTOMY;  Surgeon: Lucilla Lame, MD;  Location: [place];  Service: Endoscopy;;   SHOULDER ARTHROSCOPY WITH ROTATOR CUFF REPAIR AND SUBACROMIAL DECOMPRESSION Right 02/27/2020   Procedure: RIGHT SHOULDER ARTHROSCOPY SUBACROMIAL DECOMPRESSION, DISTAL CLAVICLE EXCISION AND MINI-OPEN ROTATOR CUFF REPAIR;  Surgeon: Thornton Park, MD;  Location:  [facility];  Service: Orthopedics;  Laterality: Right;   TONSILLECTOMY     VASCULAR SURGERY  [id number]   Fem-Pop Bypass   Patient Active Problem List   Diagnosis Date Noted   Degeneration of lumbar intervertebral disc 01/04/2022   Shoulder pain 01/04/2022   Pain in joint of right shoulder 01/04/2022   Spasm of back muscles 01/04/2022   Thoracic back pain 01/04/2022   History of left above knee amputation [facility] (Dec 2021) 12/27/2021   Rotator cuff arthropathy of right shoulder 12/27/2021   Hx of rotator cuff surgery 12/27/2021   Chronic pain syndrome 12/27/2021   Adhesive capsulitis of right shoulder 12/05/2021   Phantom pain ([place]) 11/10/2021   Muscle spasm 09/09/2020   S/P AKA (above knee amputation), right ([place]) 09/08/2020   Ischemia of extremity 07/02/2020   Chronic pain in right shoulder 11/25/2019   Encounter for long-term (current) use of high-risk medication 07/16/2019   GCA (giant cell arteritis) ([place]) 07/16/2019   Temporal arteritis ([place]) 07/13/2019   Postoperative wound infection 06/23/2019   Ischemic leg 05/31/2019   Atherosclerosis of native arteries of extremity with intermittent claudication ([place]) 05/30/2019   Depression 05/29/2019   Eczema of lower extremity 03/04/2018   Chronic venous insufficiency 03/02/2018   Personal history of colonic polyps    Family history of colonic polyps    Benign neoplasm of ascending colon    Mitral valve insufficiency 02/08/2017   Dyspnea on exertion 02/07/2017   Non-rheumatic mitral regurgitation 02/07/2017   Precordial pain 02/07/2017   CAD (coronary artery disease) 12/21/2016   Essential hypertension 12/21/2016   Compression fracture of lumbar spine, non-traumatic ([place]) 04/26/2016   Compression fracture of L3 lumbar vertebra 04/20/2016   PVD (peripheral vascular disease) ([place]) 02/24/2016   Family history of premature CAD 02/24/2016   Gastritis    Other specified diseases of esophagus    Hiatal hernia    Gastritis and  gastroduodenitis    Ischemia of lower extremity    Arterial occlusion ([place])    Atherosclerosis of aorta ([place])    History of smoking    Hyperlipidemia    Pain in the chest    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 02/05/2016   Carotid artery stenosis 01/04/2016   Vertigo, benign paroxysmal 12/28/2015   Clinical depression 09/06/2015   History of alcoholism ([place]) 09/06/2015   Angiopathy, 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[place]) 09/06/2015   Atherosclerosis of native arteries of extremity with rest pain ([place]) 09/06/2015   Acute non-recurrent maxillary sinusitis 07/14/2015   Vaginal pruritus 05/26/2015   Chronic recurrent major depressive disorder ([place]) 03/09/2015   Osteoporosis, post-menopausal 03/09/2015   Peripheral blood vessel disorder ([place]) 03/09/2015   Acid reflux 03/09/2015   GERD (gastroesophageal reflux disease) 12/21/2014   Carotid artery narrowing 01/28/2014   Peripheral arterial occlusive disease ([place]) 06/08/2011   Occlusion and stenosis of unspecified carotid artery 06/08/2011   PAD (peripheral artery disease) ([place]) 06/08/2011    PCP: Danelle Berry, NP  REFERRING PROVIDER: Barrie Lyme MD  REFERRING DIAGNOSIS: S29.012A (ICD-10-CM) - Strain of muscle and tendon of back wall of thorax, initial encounter  THERAPY DIAG: Other low back pain  Muscle weakness (generalized)  RATIONALE FOR EVALUATION AND TREATMENT: Rehabilitation  ONSET DATE: 08/11/20 (approximate)  FROM INITIAL EVALUATION  SUBJECTIVE:                                                                                                                                                                                         Chief Complaint: R thoracic/rib pain  Pertinent History Pt reports she she started experiencing R shoulder pain which started shortly after her RLE amputation (08/11/20). She attributes the pain to having to self-propel a wheelchair for an extended period of time. She has seen Dr.  Mack Guise at [facility] in the past and underwent a R shoulder mini-open rotator cuff repair, DCE, and SAD on 02/27/20. Since the pain started she has undergone R shoulder injections without improvement in her pain. When pt describes her pain she points to the area below her R shoulder blade around T10/T11. She denies weakness in the RUE or any pain in the area of the [facility] joint or shoulder pain. Denies N/T in back or RUE. Pain worsens with extended use of RUE especially when cleaning the counters in her apartment or sweeping the floor. She has a history of L1 and L3 compression fractures in 2017. She currently sees pain management for her R shoulder pain and takes oxycodone to manage the pain. She also struggles with chronic RLE phantom pain s/p amputation and takes gabapentin for this issue. She has a history of multiple vascular surgeries prior to her amputation and also has a history of carpal tunnel syndrome.  Pain:  Pain Intensity: Not rated today Pain location: R back/flank inferior to the shoulder blade Pain Quality: sharp  Radiating: No  Numbness/Tingling: No Focal Weakness: No Aggravating factors: Excessive use of RUE, mopping the floors, wiping countertops, no pain with driving, Relieving factors: heating pad, never tried lidocaine patch, oxycodone, gabapentin doesn't help with R flank/back pain; 24-hour pain behavior: varies depending on activity History of prior shoulder or neck/shoulder injury, pain, surgery, or therapy: Yes Falls: Has patient fallen in last 6 months? 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  Recent memory is intact.  Remote memory is intact.  Attention span and concentration are intact.  Expressive speech is intact.  Patient's fund of knowledge is within normal limits for educational level.    Gross Musculoskeletal Assessment Tremor: None Bulk: Normal Tone: Normal  Gait Pt ambulates with RLE prosthesis and lofstrand in LUE, decreased self-selected gait speed;  Posture Increased thoracic kyphosis, forward head, and rounded shoulder in sitting and standing;  Cervical Screen AROM: WFL and painless with overpressure in all planes Spurlings A (ipsilateral lateral flexion/axial compression): R: Negative L: Negative Spurlings B (ipsilateral lateral flexion/contralateral rotation/axial compression): R: Negative L: Negative Repeated movement: Not examined Hoffman Sign (cervical cord compression): R: Not examined L: Not examined ULTT Median: R: Not examined L: Not examined ULTT Ulnar: R: Not examined L: Not examined ULTT Radial: R: Not examined L: Not examined   AROM Cervical AROM is painless in all directions and WFL although not full. No reproduction of pain with AROM R shoulder flexion and abduction which is also limited but WFL. No gross deficits noted. Pt denies pain with thoracic flexion, extension, rotation, and lateral flexion.    LE MMT:  MMT (out of 5) Right 03/15/2022 Left 03/15/2022  Cervical (isometric)  Flexion WNL  Extension WNL  Lateral Flexion WNL WNL  Rotation WNL WNL      Shoulder   Flexion 4+ 4+  Extension    Abduction 4+ 4+  External rotation (seated) 4+ 4+   Internal rotation (seated) 4+ 4+  Horizontal abduction    Horizontal adduction    Lower Trapezius    Rhomboids        Elbow  Flexion 5 5  Extension 5 5  Pronation    Supination        Wrist  Flexion 5 5  Extension 5 5  Radial deviation    Ulnar deviation        MCP  Flexion    Extension    Abduction 5 5  Adduction 5 5  (* = pain; Blank rows = not tested)  Sensation Grossly intact to light touch bilateral UE as determined by testing dermatomes C2-T2. Proprioception and hot/cold testing deferred on this date.  Reflexes Deferred   Palpation Pt is nontender to palpation around entire R shoulder girdle anterior, lateral, and posterior. No pain with palpation to R cervical paraspinals, upper trap, rhomboids, or mid trap. She has pain to palpation along spinous processes from T9-L3 as well as along paraspinals in this region on the right.   Repeated Movements Deferred  Passive Accessory Intervertebral Motion Pt reports reproduction of pain with CPA and bilateral UPA T9-L3. Unable to fully assess mobility secondary to pain however appear grossly hypomobile with notable thoracic kyphosis when prone.   SPECIAL TESTS Rotator Cuff  Drop Arm Test: Negative Painful Arc (Pain from 60 to 120 degrees scaption): Negative Infraspinatus Muscle Test: Negative   Subacromial Impingement Hawkins-Kennedy: Negative Neer (Block scapula, PROM flexion): Negative Painful Arc (Pain from 60 to 120 degrees scaption): Negative Empty Can: Negative External Rotation Resistance: Negative Horizontal Adduction: Not examined Scapular Assist: Not examined   Labral Tear Biceps Load II (120 elevation, full ER, 90 elbow flexion, full supination, resisted elbow flexion): Not examined Crank (160 scaption, axial load with IR/ER): Not examined Active Compression Test: Not examined  Bicep  Tendon Pathology Speed (shoulder flexion to 90, external rotation, full elbow extension, and forearm  supination with resistance: Not examined Yergason's (resisted shoulder ER and supination/biceps tendon pathology): Not examined  Shoulder Instability Sulcus Sign: Not examined Anterior Apprehension: Not examined  Beighton scale: Deferred   Outcome Measures Quick DASH: Deferred FOTO: Pt completed shoulder FOTO but this appears to be more related to back pain. Will update at next therapy session.    TODAY'S TREATMENT (03/09/22)   SUBJECTIVE: Pt denies current pain in R mid back upon arrival at rest. She had a lot of soreness in her back after the last therapy session. No specific questions currently. Lumbar medial branch block scheduled for next week on 03/22/22.  PAIN: Denies  Ther-ex: Prone alternating hip extension 2 x 10 BLE; Prone R shoulder extension with 2# dumbbell (DB) 2 x 10; Prone R horizontal abduction with 2# DB 2 x 10; Prone R low trap flexion (Y) with 2# DB 2 x 10; Prone R low row with 2# DB 2 x 10; Seated pallof press with green tband 2 x 10 toward each side; Seated rows with green tband 2 x 10;   Manual Therapy  Moist heat pack applied to R thoracic spine/flank with pt in prone at start of session x 5 minutes (unbilled); Extensive STM to R thoracic/upper lumbar erector spinae group and latissimus with effeurage, shingling, and use of Theraband Roller x 10 minutes;   PATIENT EDUCATION:  Education details: Pt educated throughout session about proper posture and technique with exercises. Improved exercise technique, movement at target joints, use of target muscles after min to mod verbal, visual, tactile cues.  Person educated: Patient Education method: Explanation, verbal cues, tactile cues, and demonstration Education comprehension: verbalized understanding and returned demonstration;   HOME EXERCISE PROGRAM: None currently   ASSESSMENT:  CLINICAL IMPRESSION: Deferred electrical stimulation during session again today as pt denies any resting back pain  upon arrival. Continued with extensive STM to improve muscle extensibility and overall pt continues to be much less tender to palpation compared to prior sessions. Hip and upper quarter strengthening repeated again during session however decrease intensity today as pt reports excessive soreness after last therapy session. Pt denies any pain throughout exercises today. Overall pt is reporting improvement in her pain with therapy. Pt encouraged to follow-up as scheduled. Pt will benefit from PT services to address deficits in strength and pain in order to return to full function at home with less pain.    REHAB POTENTIAL: Fair    CLINICAL DECISION MAKING: Unstable/unpredictable  EVALUATION COMPLEXITY: High   GOALS: Goals reviewed with patient? Yes  SHORT TERM GOALS: Target date:  03/14/2022  Pt will be independent with HEP to improve strength and decrease back pain to improve pain-free function at home. Baseline:  Goal status: INITIAL   LONG TERM GOALS: Target date: 04/11/2022  Pt will increase FOTO to at least 57 in order to demonstrate significant improvement in function at home and work related to back pain  Baseline: 02/14/22: To be completed at next visit; 02/22/22: 45 Goal status: INITIAL  2.  Pt will decrease worst back pain by at least 2 points on the NPRS in order to demonstrate clinically significant reduction in back pain. Baseline: 02/14/22: To be completed at next visit Goal status: INITIAL  3.  Pt will report at least 50% improvement in her back symptoms in order to demonstrate clinically significant reduction in disability related to back pain;  Baseline:  Goal status: INITIAL   PLAN: PT FREQUENCY: 1-2x/week  PT DURATION: 8 weeks  PLANNED INTERVENTIONS: Therapeutic exercises, Therapeutic activity, Neuromuscular re-education, Balance training, Gait training, Patient/Family education, Joint manipulation, Joint mobilization, Vestibular training, Canalith  repositioning, Aquatic Therapy, Dry Needling, Electrical stimulation, Spinal manipulation, Spinal mobilization, Cryotherapy, Moist heat, Traction, Ultrasound, Ionotophoresis '4mg'$ /ml Dexamethasone, and Manual therapy  PLAN FOR NEXT SESSION: Continue manual techniques for R sided back pain, continue stretching/strengthening;    Phillips Grout PT, DPT, GCS  Physical Therapist- [facility]  03/15/2022, 1:11 PM

## 2022-03-15 ENCOUNTER — Ambulatory Visit: Payer: Medicare Other

## 2022-03-15 DIAGNOSIS — M5459 Other low back pain: Secondary | ICD-10-CM

## 2022-03-15 DIAGNOSIS — M6281 Muscle weakness (generalized): Secondary | ICD-10-CM

## 2022-03-15 DIAGNOSIS — M25511 Pain in right shoulder: Secondary | ICD-10-CM | POA: Diagnosis not present

## 2022-03-15 DIAGNOSIS — G8929 Other chronic pain: Secondary | ICD-10-CM | POA: Diagnosis not present

## 2022-03-17 NOTE — Therapy (Signed)
OUTPATIENT PHYSICAL THERAPY SHOULDER/BACK TREATMENT   Patient Name: Sara [name] MRN: [id number] DOB:05/15/1951, 71 y.o., female Today's Date: 03/20/2022   PT End of Session - 03/20/22 1201     Visit Number 8    Number of Visits 25    Date for PT Re-Evaluation 05/09/22    Authorization Type eval: 02/14/22    PT Start Time 1145    PT Stop Time 1230    PT Time Calculation (min) 45 min    Activity Tolerance Patient tolerated treatment well    Behavior During Therapy [facility] for tasks assessed/performed              Past Medical History:  Diagnosis Date   Arthritis    right shoulder   Depression    Dyspnea    GERD (gastroesophageal reflux disease)    History of blood clots    Hyperlipidemia    Hypertension    Mild mitral regurgitation    Osteoporosis    Peripheral vascular disease ([place])    Stomach ulcer    Vascular disease    Sees Dr. Delana Meyer   Vertigo    Last episode approx Aug 2015   Wears dentures    full upper   Past Surgical History:  Procedure Laterality Date   ADENOIDECTOMY     AMPUTATION Right 08/11/2020   Procedure: AMPUTATION ABOVE KNEE;  Surgeon: [name], MD;  Location: [facility];  Service: Vascular;  Laterality: Right;   ARTERY BIOPSY Right 07/14/2019   Procedure: BIOPSY TEMPORAL ARTERY;  Surgeon: [name], MD;  Location: [facility];  Service: Vascular;  Laterality: Right;   BREAST CYST ASPIRATION Left    CARDIAC CATHETERIZATION  02/15/2017   [facility]   COLONOSCOPY WITH PROPOFOL N/A 10/22/2017   Procedure: COLONOSCOPY WITH PROPOFOL;  Surgeon: Lucilla Lame, MD;  Location: [place];  Service: Endoscopy;  Laterality: N/A;  specimens not taken--pt on Plavix will be brought back in after 7 days off med   COLONOSCOPY WITH PROPOFOL N/A 11/05/2017   Procedure: COLONOSCOPY WITH PROPOFOL;  Surgeon: Lucilla Lame, MD;  Location: [place];  Service: Endoscopy;  Laterality: N/A;   ESOPHAGOGASTRODUODENOSCOPY N/A 12/21/2014    Procedure: ESOPHAGOGASTRODUODENOSCOPY (EGD);  Surgeon: Lucilla Lame, MD;  Location: [place];  Service: Gastroenterology;  Laterality: N/A;   ESOPHAGOGASTRODUODENOSCOPY (EGD) WITH PROPOFOL N/A 02/08/2016   Procedure: ESOPHAGOGASTRODUODENOSCOPY (EGD) WITH PROPOFOL;  Surgeon: Lucilla Lame, MD;  Location: [facility];  Service: Endoscopy;  Laterality: N/A;   FASCIOTOMY Right 05/30/2019   Procedure: FASCIOTOMY;  Surgeon: [name], MD;  Location: [facility];  Service: Vascular;  Laterality: Right;   FASCIOTOMY CLOSURE Right 06/04/2019   Procedure: FASCIOTOMY CLOSURE;  Surgeon: [name], MD;  Location: [facility];  Service: Vascular;  Laterality: Right;   HEMORROIDECTOMY  2014   LOWER EXTREMITY ANGIOGRAPHY Right 05/30/2019   Procedure: LOWER EXTREMITY ANGIOGRAPHY;  Surgeon: [name], MD;  Location: [place] [facility];  Service: Cardiovascular;  Laterality: Right;   LOWER EXTREMITY ANGIOGRAPHY Right 07/02/2020   Procedure: LOWER EXTREMITY ANGIOGRAPHY;  Surgeon: [name], MD;  Location: [place] [facility];  Service: Cardiovascular;  Laterality: Right;   PERIPHERAL VASCULAR CATHETERIZATION N/A 02/09/2016   Procedure: Abdominal Aortogram w/Lower Extremity;  Surgeon: [name], MD;  Location: [place] [facility];  Service: Cardiovascular;  Laterality: N/A;   PERIPHERAL VASCULAR CATHETERIZATION Right 02/10/2016   Procedure: Lower Extremity Angiography;  Surgeon: [name], MD;  Location: [place]  [facility];  Service: Cardiovascular;  Laterality: Right;   POLYPECTOMY  11/05/2017   Procedure: POLYPECTOMY;  Surgeon: Lucilla Lame, MD;  Location: [place];  Service: Endoscopy;;   SHOULDER ARTHROSCOPY WITH ROTATOR CUFF REPAIR AND SUBACROMIAL DECOMPRESSION Right 02/27/2020   Procedure: RIGHT SHOULDER ARTHROSCOPY SUBACROMIAL DECOMPRESSION, DISTAL CLAVICLE EXCISION AND MINI-OPEN ROTATOR CUFF REPAIR;  Surgeon: Thornton Park, MD;  Location:  [facility];  Service: Orthopedics;  Laterality: Right;   TONSILLECTOMY     VASCULAR SURGERY  [id number]   Fem-Pop Bypass   Patient Active Problem List   Diagnosis Date Noted   Degeneration of lumbar intervertebral disc 01/04/2022   Shoulder pain 01/04/2022   Pain in joint of right shoulder 01/04/2022   Spasm of back muscles 01/04/2022   Thoracic back pain 01/04/2022   History of left above knee amputation [facility]) (Dec 2021) 12/27/2021   Rotator cuff arthropathy of right shoulder 12/27/2021   Hx of rotator cuff surgery 12/27/2021   Chronic pain syndrome 12/27/2021   Adhesive capsulitis of right shoulder 12/05/2021   Phantom pain ([place]) 11/10/2021   Muscle spasm 09/09/2020   S/P AKA (above knee amputation), right ([place]) 09/08/2020   Ischemia of extremity 07/02/2020   Chronic pain in right shoulder 11/25/2019   Encounter for long-term (current) use of high-risk medication 07/16/2019   GCA (giant cell arteritis) ([place]) 07/16/2019   Temporal arteritis ([place]) 07/13/2019   Postoperative wound infection 06/23/2019   Ischemic leg 05/31/2019   Atherosclerosis of native arteries of extremity with intermittent claudication ([place]) 05/30/2019   Depression 05/29/2019   Eczema of lower extremity 03/04/2018   Chronic venous insufficiency 03/02/2018   Personal history of colonic polyps    Family history of colonic polyps    Benign neoplasm of ascending colon    Mitral valve insufficiency 02/08/2017   Dyspnea on exertion 02/07/2017   Non-rheumatic mitral regurgitation 02/07/2017   Precordial pain 02/07/2017   CAD (coronary artery disease) 12/21/2016   Essential hypertension 12/21/2016   Compression fracture of lumbar spine, non-traumatic ([place]) 04/26/2016   Compression fracture of L3 lumbar vertebra 04/20/2016   PVD (peripheral vascular disease) ([place]) 02/24/2016   Family history of premature CAD 02/24/2016   Gastritis    Other specified diseases of esophagus    Hiatal hernia    Gastritis and  gastroduodenitis    Ischemia of lower extremity    Arterial occlusion ([place])    Atherosclerosis of aorta ([place])    History of smoking    Hyperlipidemia    Pain in the chest    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 02/05/2016   Carotid artery stenosis 01/04/2016   Vertigo, benign paroxysmal 12/28/2015   Clinical depression 09/06/2015   History of alcoholism ([place]) 09/06/2015   Angiopathy, 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[place]) 09/06/2015   Atherosclerosis of native arteries of extremity with rest pain ([place]) 09/06/2015   Acute non-recurrent maxillary sinusitis 07/14/2015   Vaginal pruritus 05/26/2015   Chronic recurrent major depressive disorder ([place]) 03/09/2015   Osteoporosis, post-menopausal 03/09/2015   Peripheral blood vessel disorder ([place]) 03/09/2015   Acid reflux 03/09/2015   GERD (gastroesophageal reflux disease) 12/21/2014   Carotid artery narrowing 01/28/2014   Peripheral arterial occlusive disease ([place]) 06/08/2011   Occlusion and stenosis of unspecified carotid artery 06/08/2011   PAD (peripheral artery disease) ([place]) 06/08/2011    PCP: Danelle Berry, NP  REFERRING PROVIDER: Barrie Lyme MD  REFERRING DIAGNOSIS: S29.012A (ICD-10-CM) - Strain of muscle and tendon of back wall of thorax, initial encounter  THERAPY DIAG: Other low back pain  Muscle weakness (generalized)  RATIONALE FOR EVALUATION AND TREATMENT: Rehabilitation  ONSET DATE: 08/11/20 (approximate)  FROM INITIAL EVALUATION  SUBJECTIVE:                                                                                                                                                                                         Chief Complaint: R thoracic/rib pain  Pertinent History Pt reports she she started experiencing R shoulder pain which started shortly after her RLE amputation (08/11/20). She attributes the pain to having to self-propel a wheelchair for an extended period of time. She has seen Dr.  Mack Guise at [facility] in the past and underwent a R shoulder mini-open rotator cuff repair, DCE, and SAD on 02/27/20. Since the pain started she has undergone R shoulder injections without improvement in her pain. When pt describes her pain she points to the area below her R shoulder blade around T10/T11. She denies weakness in the RUE or any pain in the area of the [facility] joint or shoulder pain. Denies N/T in back or RUE. Pain worsens with extended use of RUE especially when cleaning the counters in her apartment or sweeping the floor. She has a history of L1 and L3 compression fractures in 2017. She currently sees pain management for her R shoulder pain and takes oxycodone to manage the pain. She also struggles with chronic RLE phantom pain s/p amputation and takes gabapentin for this issue. She has a history of multiple vascular surgeries prior to her amputation and also has a history of carpal tunnel syndrome.  Pain:  Pain Intensity: Not rated today Pain location: R back/flank inferior to the shoulder blade Pain Quality: sharp  Radiating: No  Numbness/Tingling: No Focal Weakness: No Aggravating factors: Excessive use of RUE, mopping the floors, wiping countertops, no pain with driving, Relieving factors: heating pad, never tried lidocaine patch, oxycodone, gabapentin doesn't help with R flank/back pain; 24-hour pain behavior: varies depending on activity History of prior shoulder or neck/shoulder injury, pain, surgery, or therapy: Yes Falls: Has patient fallen in last 6 months? 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  Recent memory is intact.  Remote memory is intact.  Attention span and concentration are intact.  Expressive speech is intact.  Patient's fund of knowledge is within normal limits for educational level.    Gross Musculoskeletal Assessment Tremor: None Bulk: Normal Tone: Normal  Gait Pt ambulates with RLE prosthesis and lofstrand in LUE, decreased self-selected gait speed;  Posture Increased thoracic kyphosis, forward head, and rounded shoulder in sitting and standing;  Cervical Screen AROM: WFL and painless with overpressure in all planes Spurlings A (ipsilateral lateral flexion/axial compression): R: Negative L: Negative Spurlings B (ipsilateral lateral flexion/contralateral rotation/axial compression): R: Negative L: Negative Repeated movement: Not examined Hoffman Sign (cervical cord compression): R: Not examined L: Not examined ULTT Median: R: Not examined L: Not examined ULTT Ulnar: R: Not examined L: Not examined ULTT Radial: R: Not examined L: Not examined   AROM Cervical AROM is painless in all directions and WFL although not full. No reproduction of pain with AROM R shoulder flexion and abduction which is also limited but WFL. No gross deficits noted. Pt denies pain with thoracic flexion, extension, rotation, and lateral flexion.    LE MMT:  MMT (out of 5) Right 03/20/2022 Left 03/20/2022  Cervical (isometric)  Flexion WNL  Extension WNL  Lateral Flexion WNL WNL  Rotation WNL WNL      Shoulder   Flexion 4+ 4+  Extension    Abduction 4+ 4+  External rotation (seated) 4+ 4+   Internal rotation (seated) 4+ 4+  Horizontal abduction    Horizontal adduction    Lower Trapezius    Rhomboids        Elbow  Flexion 5 5  Extension 5 5  Pronation    Supination        Wrist  Flexion 5 5  Extension 5 5  Radial deviation    Ulnar deviation        MCP  Flexion    Extension    Abduction 5 5  Adduction 5 5  (* = pain; Blank rows = not tested)  Sensation Grossly intact to light touch bilateral UE as determined by testing dermatomes C2-T2. Proprioception and hot/cold testing deferred on this date.  Reflexes Deferred   Palpation Pt is nontender to palpation around entire R shoulder girdle anterior, lateral, and posterior. No pain with palpation to R cervical paraspinals, upper trap, rhomboids, or mid trap. She has pain to palpation along spinous processes from T9-L3 as well as along paraspinals in this region on the right.   Repeated Movements Deferred  Passive Accessory Intervertebral Motion Pt reports reproduction of pain with CPA and bilateral UPA T9-L3. Unable to fully assess mobility secondary to pain however appear grossly hypomobile with notable thoracic kyphosis when prone.   SPECIAL TESTS Rotator Cuff  Drop Arm Test: Negative Painful Arc (Pain from 60 to 120 degrees scaption): Negative Infraspinatus Muscle Test: Negative   Subacromial Impingement Hawkins-Kennedy: Negative Neer (Block scapula, PROM flexion): Negative Painful Arc (Pain from 60 to 120 degrees scaption): Negative Empty Can: Negative External Rotation Resistance: Negative Horizontal Adduction: Not examined Scapular Assist: Not examined   Labral Tear Biceps Load II (120 elevation, full ER, 90 elbow flexion, full supination, resisted elbow flexion): Not examined Crank (160 scaption, axial load with IR/ER): Not examined Active Compression Test: Not examined  Bicep  Tendon Pathology Speed (shoulder flexion to 90, external rotation, full elbow extension, and forearm  supination with resistance: Not examined Yergason's (resisted shoulder ER and supination/biceps tendon pathology): Not examined  Shoulder Instability Sulcus Sign: Not examined Anterior Apprehension: Not examined  Beighton scale: Deferred   Outcome Measures Quick DASH: Deferred FOTO: Pt completed shoulder FOTO but this appears to be more related to back pain. Will update at next therapy session.    TODAY'S TREATMENT (03/20/22)   SUBJECTIVE: Pt reports 7/10 pain in R mid back upon arrival. No excessive soreness after last therapy session. No specific questions currently. Lumbar medial branch block scheduled for 03/22/22.  PAIN: Denies  Ther-ex: Prone alternating hip extension 2 x 10 BLE; Prone R shoulder extension with 2# dumbbell (DB) 2 x 10; Prone R horizontal abduction with 2# DB 2 x 10; Prone R low trap flexion (Y) with 2# DB 2 x 10; Prone R low row with 2# DB 2 x 10; Prone R shoulder ER with 2# DB 2 x 10;   Manual Therapy  Moist heat pack applied to R thoracic spine/flank with pt in prone at start of session x 5 minutes (unbilled); Extensive STM to R thoracic/upper lumbar erector spinae group and latissimus with effeurage, shingling, and use of Theraband Roller x 10 minutes;   Electrical Stimulation  IFC to R lower thoracic/upper lumbar spine over area of discomfort with [employer] unit using default settings at pt tolerated intensity (10) x 10 minutes with concurrent moist heat pack;   PATIENT EDUCATION:  Education details: Pt educated throughout session about proper posture and technique with exercises. Improved exercise technique, movement at target joints, use of target muscles after min to mod verbal, visual, tactile cues.  Person educated: Patient Education method: Explanation, verbal cues, tactile cues, and demonstration Education comprehension: verbalized understanding and returned demonstration;   HOME EXERCISE PROGRAM: None  currently   ASSESSMENT:  CLINICAL IMPRESSION: Pt reporting increase in R back/flank pain upon arrival today. Utilized estim to help with pain control at start of session and also continued with extensive STM to improve muscle extensibility. Pt remains relatively pain-free to palpation today compared to prior sessions. Hip and upper quarter strengthening repeated again during session and continued with prone strengthening to prevent excessive soreness. Pt denies any pain throughout exercises today. Overall pt is reporting improvement in her pain with therapy. She has a medial branch block scheduled for 03/22/22. Pt encouraged to follow-up as scheduled. Pt will benefit from PT services to address deficits in strength and pain in order to return to full function at home with less pain.    REHAB POTENTIAL: Fair    CLINICAL DECISION MAKING: Unstable/unpredictable  EVALUATION COMPLEXITY: High   GOALS: Goals reviewed with patient? Yes  SHORT TERM GOALS: Target date:  03/14/2022  Pt will be independent with HEP to improve strength and decrease back pain to improve pain-free function at home. Baseline:  Goal status: INITIAL   LONG TERM GOALS: Target date: 04/11/2022  Pt will increase FOTO to at least 57 in order to demonstrate significant improvement in function at home and work related to back pain  Baseline: 02/14/22: To be completed at next visit; 02/22/22: 45 Goal status: INITIAL  2.  Pt will decrease worst back pain by at least 2 points on the NPRS in order to demonstrate clinically significant reduction in back pain. Baseline: 02/14/22: To be completed at next visit Goal status: INITIAL  3.  Pt will report at least 50% improvement  in her back symptoms in order to demonstrate clinically significant reduction in disability related to back pain;        Baseline:  Goal status: INITIAL   PLAN: PT FREQUENCY: 1-2x/week  PT DURATION: 8 weeks  PLANNED INTERVENTIONS: Therapeutic exercises,  Therapeutic activity, Neuromuscular re-education, Balance training, Gait training, Patient/Family education, Joint manipulation, Joint mobilization, Vestibular training, Canalith repositioning, Aquatic Therapy, Dry Needling, Electrical stimulation, Spinal manipulation, Spinal mobilization, Cryotherapy, Moist heat, Traction, Ultrasound, Ionotophoresis '4mg'$ /ml Dexamethasone, and Manual therapy  PLAN FOR NEXT SESSION: Continue manual techniques for R sided back pain, continue stretching/strengthening;    Phillips Grout PT, DPT, GCS  Physical Therapist- [facility]  03/20/2022, 1:15 PM

## 2022-03-20 ENCOUNTER — Ambulatory Visit: Payer: Medicare Other

## 2022-03-20 DIAGNOSIS — M6281 Muscle weakness (generalized): Secondary | ICD-10-CM

## 2022-03-20 DIAGNOSIS — M5459 Other low back pain: Secondary | ICD-10-CM

## 2022-03-20 DIAGNOSIS — G8929 Other chronic pain: Secondary | ICD-10-CM | POA: Diagnosis not present

## 2022-03-20 DIAGNOSIS — M25511 Pain in right shoulder: Secondary | ICD-10-CM | POA: Diagnosis not present

## 2022-03-22 ENCOUNTER — Encounter: Payer: Self-pay | Admitting: Student in an Organized Health Care Education/Training Program

## 2022-03-22 ENCOUNTER — Ambulatory Visit
Admission: RE | Admit: 2022-03-22 | Discharge: 2022-03-22 | Disposition: A | Discharge status: Home / Self Care | Payer: Medicare Other | Source: Ambulatory Visit | Attending: Student in an Organized Health Care Education/Training Program | Admitting: Student in an Organized Health Care Education/Training Program

## 2022-03-22 ENCOUNTER — Ambulatory Visit
Payer: Medicare Other | Attending: Student in an Organized Health Care Education/Training Program | Admitting: Student in an Organized Health Care Education/Training Program

## 2022-03-22 VITALS — BP 133/69 | HR 88 | Temp 97.2°F | Resp 16 | Ht 63.0 in | Wt 120.0 lb

## 2022-03-22 DIAGNOSIS — M47816 Spondylosis without myelopathy or radiculopathy, lumbar region: Secondary | ICD-10-CM | POA: Insufficient documentation

## 2022-03-22 MED ORDER — LIDOCAINE HCL 2 % IJ SOLN
INTRAMUSCULAR | Status: AC
  Filled 2022-03-22: qty 20

## 2022-03-22 MED ORDER — MIDAZOLAM HCL 5 MG/5ML IJ SOLN
0.5000 mg | Freq: Once | INTRAMUSCULAR | Status: AC
  Administered 2022-03-22: 1.5 mg via INTRAVENOUS

## 2022-03-22 MED ORDER — MIDAZOLAM HCL 5 MG/5ML IJ SOLN
INTRAMUSCULAR | Status: AC
  Filled 2022-03-22: qty 5

## 2022-03-22 MED ORDER — DEXAMETHASONE SODIUM PHOSPHATE 10 MG/ML IJ SOLN
10.0000 mg | Freq: Once | INTRAMUSCULAR | Status: AC
  Administered 2022-03-22: 10 mg

## 2022-03-22 MED ORDER — ROPIVACAINE HCL 2 MG/ML IJ SOLN
9.0000 mL | Freq: Once | INTRAMUSCULAR | Status: AC
  Administered 2022-03-22: 9 mL via PERINEURAL

## 2022-03-22 MED ORDER — ROPIVACAINE HCL 2 MG/ML IJ SOLN
INTRAMUSCULAR | Status: AC
  Filled 2022-03-22: qty 20

## 2022-03-22 MED ORDER — DEXAMETHASONE SODIUM PHOSPHATE 10 MG/ML IJ SOLN
INTRAMUSCULAR | Status: AC
  Filled 2022-03-22: qty 2

## 2022-03-22 MED ORDER — LIDOCAINE HCL 2 % IJ SOLN
20.0000 mL | Freq: Once | INTRAMUSCULAR | Status: AC
  Administered 2022-03-22: 400 mg

## 2022-03-22 NOTE — Progress Notes (Signed)
Safety precautions to be maintained throughout the outpatient stay will include: orient to surroundings, keep bed in low position, maintain call bell within reach at all times, provide assistance with transfer out of bed and ambulation.  

## 2022-03-22 NOTE — Progress Notes (Signed)
PROVIDER NOTE: Interpretation of information contained herein should be left to medically-trained personnel. Specific patient instructions are provided elsewhere under "Patient Instructions" section of medical record. This document was created in part using STT-dictation technology, any transcriptional errors that may result from this process are unintentional.  Patient: Sara [name] Type: Established DOB: 1951/06/02 MRN: [id number] PCP: Danelle Berry, NP  Service: Procedure DOS: 03/22/2022 Setting: Ambulatory Location: Ambulatory outpatient facility Delivery: Face-to-face Provider: Gillis Santa, MD Specialty: Interventional Pain Management Specialty designation: 09 Location: Outpatient facility Ref. Prov.: Danelle Berry, NP    Procedure:           Type: Lumbar Facet, Medial Branch Block(s) #1  Laterality: Bilateral  Level: L2, L3, L4, Medial Branch Level(s). Injecting these levels blocks the L2-3, L3-4 lumbar facet joints. Imaging: Fluoroscopic guidance         Anesthesia: Local anesthesia (1-2% Lidocaine) Anxiolysis: IV Versed          DOS: 03/22/2022 Performed by: Gillis Santa, MD  Primary Purpose: Diagnostic/Therapeutic Indications: Low back pain severe enough to impact quality of life or function. 1. Lumbar facet arthropathy    NAS-11 Pain score:   Pre-procedure: 4/10   Post-procedure: 0-No pain/10     Position / Prep / Materials:  Position: Prone  Prep solution: DuraPrep (Iodine Povacrylex [0.7% available iodine] and Isopropyl Alcohol, 74% w/w) Area Prepped: Posterolateral Lumbosacral Spine (Wide prep: From the lower border of the scapula down to the end of the tailbone and from flank to flank.)  Materials:  Tray: Block Needle(s):  Type: Spinal  Gauge (G): 22  Length: 5-in Qty: 4  Pre-op H&P Assessment:  Sara [name] is a 71 y.o. (year old), female patient, seen today for interventional treatment. She  has a past surgical history that includes Vascular  surgery ([id number],8341); Hemorroidectomy (2014); Esophagogastroduodenoscopy (N/A, 12/21/2014); Esophagogastroduodenoscopy (egd) with propofol (N/A, 02/08/2016); Cardiac catheterization (N/A, 02/09/2016); Cardiac catheterization (Right, 02/10/2016); Cardiac catheterization (02/15/2017); Colonoscopy with propofol (N/A, 10/22/2017); Colonoscopy with propofol (N/A, 11/05/2017); polypectomy (11/05/2017); Tonsillectomy; Adenoidectomy; Lower Extremity Angiography (Right, 05/30/2019); Fasciotomy closure (Right, 06/04/2019); Artery Biopsy (Right, 07/14/2019); Fasciotomy (Right, 05/30/2019); Breast cyst aspiration (Left); Shoulder arthroscopy with rotator cuff repair and subacromial decompression (Right, 02/27/2020); Lower Extremity Angiography (Right, 07/02/2020); and Amputation (Right, 08/11/2020). Sara [name] has a current medication list which includes the following prescription(s): alendronate, atorvastatin, calcium carbonate, cholecalciferol, duloxetine, gabapentin, losartan, actemra actpen, zolpidem, apixaban, and omeprazole. Her primarily concern today is the Back Pain  Initial Vital Signs:  Pulse/HCG Rate: 88  Temp:  (!) 97.2 F (36.2 C) Resp: 16 BP: 131/86 SpO2: 95 %  BMI: Estimated body mass index is 21.26 kg/m as calculated from the following:   Height as of this encounter: '5\' 3"'$  (1.6 m).   Weight as of this encounter: 120 lb (54.4 kg).  Risk Assessment: Allergies: Reviewed. She is allergic to hydrocodone-acetaminophen, amoxicillin, metoprolol tartrate, oxycodone, and vicodin [hydrocodone-acetaminophen].  Allergy Precautions: None required Coagulopathies: Reviewed. None identified.  Blood-thinner therapy: None at this time Active Infection(s): Reviewed. None identified. Sara [name] is afebrile  Site Confirmation: Sara [name] was asked to confirm the procedure and laterality before marking the site Procedure checklist: Completed Consent: Before the procedure and under the influence of no sedative(s),  amnesic(s), or anxiolytics, the patient was informed of the treatment options, risks and possible complications. To fulfill our ethical and legal obligations, as recommended by the American Medical Association's Code of Ethics, I have informed the patient of my clinical impression; the nature and purpose of  the treatment or procedure; the risks, benefits, and possible complications of the intervention; the alternatives, including doing nothing; the risk(s) and benefit(s) of the alternative treatment(s) or procedure(s); and the risk(s) and benefit(s) of doing nothing. The patient was provided information about the general risks and possible complications associated with the procedure. These may include, but are not limited to: failure to achieve desired goals, infection, bleeding, organ or nerve damage, allergic reactions, paralysis, and death. In addition, the patient was informed of those risks and complications associated to Spine-related procedures, such as failure to decrease pain; infection (i.e.: Meningitis, epidural or intraspinal abscess); bleeding (i.e.: epidural hematoma, subarachnoid hemorrhage, or any other type of intraspinal or peri-dural bleeding); organ or nerve damage (i.e.: Any type of peripheral nerve, nerve root, or spinal cord injury) with subsequent damage to sensory, motor, and/or autonomic systems, resulting in permanent pain, numbness, and/or weakness of one or several areas of the body; allergic reactions; (i.e.: anaphylactic reaction); and/or death. Furthermore, the patient was informed of those risks and complications associated with the medications. These include, but are not limited to: allergic reactions (i.e.: anaphylactic or anaphylactoid reaction(s)); adrenal axis suppression; blood sugar elevation that in diabetics may result in ketoacidosis or comma; water retention that in patients with history of congestive heart failure may result in shortness of breath, pulmonary edema, and  decompensation with resultant heart failure; weight gain; swelling or edema; medication-induced neural toxicity; particulate matter embolism and blood vessel occlusion with resultant organ, and/or nervous system infarction; and/or aseptic necrosis of one or more joints. Finally, the patient was informed that Medicine is not an exact science; therefore, there is also the possibility of unforeseen or unpredictable risks and/or possible complications that may result in a catastrophic outcome. The patient indicated having understood very clearly. We have given the patient no guarantees and we have made no promises. Enough time was given to the patient to ask questions, all of which were answered to the patient's satisfaction. Sara [name] has indicated that she wanted to continue with the procedure. Attestation: I, the ordering provider, attest that I have discussed with the patient the benefits, risks, side-effects, alternatives, likelihood of achieving goals, and potential problems during recovery for the procedure that I have provided informed consent. Date  Time: 03/22/2022 12:47 PM  Pre-Procedure Preparation:  Monitoring: As per clinic protocol. Respiration, ETCO2, SpO2, BP, heart rate and rhythm monitor placed and checked for adequate function Safety Precautions: Patient was assessed for positional comfort and pressure points before starting the procedure. Time-out: I initiated and conducted the "Time-out" before starting the procedure, as per protocol. The patient was asked to participate by confirming the accuracy of the "Time Out" information. Verification of the correct person, site, and procedure were performed and confirmed by me, the nursing staff, and the patient. "Time-out" conducted as per Joint Commission's Universal Protocol (UP.01.01.01). Time: 1333  Description of Procedure:          Laterality: Bilateral. The procedure was performed in identical fashion on both sides. Targeted Levels:   L2, L3, L4,  Medial Branch Level(s)  Safety Precautions: Aspiration looking for blood return was conducted prior to all injections. At no point did we inject any substances, as a needle was being advanced. Before injecting, the patient was told to immediately notify me if she was experiencing any new onset of "ringing in the ears, or metallic taste in the mouth". No attempts were made at seeking any paresthesias. Safe injection practices and needle disposal techniques used. Medications properly  checked for expiration dates. SDV (single dose vial) medications used. After the completion of the procedure, all disposable equipment used was discarded in the proper designated medical waste containers. Local Anesthesia: Protocol guidelines were followed. The patient was positioned over the fluoroscopy table. The area was prepped in the usual manner. The time-out was completed. The target area was identified using fluoroscopy. A 12-in long, straight, sterile hemostat was used with fluoroscopic guidance to locate the targets for each level blocked. Once located, the skin was marked with an approved surgical skin marker. Once all sites were marked, the skin (epidermis, dermis, and hypodermis), as well as deeper tissues (fat, connective tissue and muscle) were infiltrated with a small amount of a short-acting local anesthetic, loaded on a 10cc syringe with a 25G, 1.5-in  Needle. An appropriate amount of time was allowed for local anesthetics to take effect before proceeding to the next step. Local Anesthetic: Lidocaine 2.0% The unused portion of the local anesthetic was discarded in the proper designated containers. Technical description of process:  L2 Medial Branch Nerve Block (MBB): The target area for the L2 medial branch is at the junction of the postero-lateral aspect of the superior articular process and the superior, posterior, and medial edge of the transverse process of L3. Under fluoroscopic guidance, a  Quincke needle was inserted until contact was made with os over the superior postero-lateral aspect of the pedicular shadow (target area). After negative aspiration for blood, 38m of the nerve block solution was injected without difficulty or complication. The needle was removed intact. L3 Medial Branch Nerve Block (MBB): The target area for the L3 medial branch is at the junction of the postero-lateral aspect of the superior articular process and the superior, posterior, and medial edge of the transverse process of L4. Under fluoroscopic guidance, a Quincke needle was inserted until contact was made with os over the superior postero-lateral aspect of the pedicular shadow (target area). After negative aspiration for blood, 2456mof the nerve block solution was injected without difficulty or complication. The needle was removed intact. L4 Medial Branch Nerve Block (MBB): The target area for the L4 medial branch is at the junction of the postero-lateral aspect of the superior articular process and the superior, posterior, and medial edge of the transverse process of L5. Under fluoroscopic guidance, a Quincke needle was inserted until contact was made with os over the superior postero-lateral aspect of the pedicular shadow (target area). After negative aspiration for blood, 56m46mf the nerve block solution was injected without difficulty or complication. The needle was removed intact.   12 cc solution made of 10 cc of 0.2% ropivacaine, 2 cc of Decadron 10 mg/cc.  2 cc injected at each level above bilaterally.   Once the entire procedure was completed, the treated area was cleaned, making sure to leave some of the prepping solution back to take advantage of its long term bactericidal properties.         Illustration of the posterior view of the lumbar spine and the posterior neural structures. Laminae of L2 through S1 are labeled. DPRL5, dorsal primary ramus of L5; DPRS1, dorsal primary ramus of S1; DPR3,  dorsal primary ramus of L3; FJ, facet (zygapophyseal) joint L3-L4; I, inferior articular process of L4; LB1, lateral branch of dorsal primary ramus of L1; IAB, inferior articular branches from L3 medial branch (supplies L4-L5 facet joint); IBP, intermediate branch plexus; MB3, medial branch of dorsal primary ramus of L3; NR3, third lumbar nerve root; S, superior articular  process of L5; SAB, superior articular branches from L4 (supplies L4-5 facet joint also); TP3, transverse process of L3.  Vitals:   03/22/22 1330 03/22/22 1335 03/22/22 1340 03/22/22 1350  BP: (!) 156/93 133/86 132/84 133/69  Pulse: 100 95 93 88  Resp: 20 (!) 21 (!) 22 16  Temp:      TempSrc:      SpO2: 95% 96% 97% 99%  Weight:      Height:         Start Time: 1333 hrs. End Time: 1340 hrs.  Imaging Guidance (Spinal):          Type of Imaging Technique: Fluoroscopy Guidance (Spinal) Indication(s): Assistance in needle guidance and placement for procedures requiring needle placement in or near specific anatomical locations not easily accessible without such assistance. Exposure Time: Please see nurses notes. Contrast: None used. Fluoroscopic Guidance: I was personally present during the use of fluoroscopy. "Tunnel Vision Technique" used to obtain the best possible view of the target area. Parallax error corrected before commencing the procedure. "Direction-depth-direction" technique used to introduce the needle under continuous pulsed fluoroscopy. Once target was reached, antero-posterior, oblique, and lateral fluoroscopic projection used confirm needle placement in all planes. Images permanently stored in EMR. Interpretation: No contrast injected. I personally interpreted the imaging intraoperatively. Adequate needle placement confirmed in multiple planes. Permanent images saved into the patient's record.  Antibiotic Prophylaxis:   Anti-infectives (From admission, onward)    None      Indication(s): None  identified  Post-operative Assessment:  Post-procedure Vital Signs:  Pulse/HCG Rate: 88  Temp:  (!) 97.2 F (36.2 C) Resp:  16 BP: 133/69 SpO2: 99 %  EBL: None  Complications: No immediate post-treatment complications observed by team, or reported by patient.  Note: The patient tolerated the entire procedure well. A repeat set of vitals were taken after the procedure and the patient was kept under observation following institutional policy, for this type of procedure. Post-procedural neurological assessment was performed, showing return to baseline, prior to discharge. The patient was provided with post-procedure discharge instructions, including a section on how to identify potential problems. Should any problems arise concerning this procedure, the patient was given instructions to immediately contact us, at any time, without hesitation. In any case, we plan to contact the patient by telephone for a follow-up status report regarding this interventional procedure.  Comments:  No additional relevant information.  Plan of Care  Orders:  Orders Placed This Encounter  Procedures   DG [facility] C-ARM 1-60 MIN NO REPORT    Intraoperative interpretation by procedural physician at [facility].    Standing Status:   Standing    Number of Occurrences:   1    Order Specific Question:   Reason for exam:    Answer:   Assistance in needle guidance and placement for procedures requiring needle placement in or near specific anatomical locations not easily accessible without such assistance.   Medications ordered for procedure: Meds ordered this encounter  Medications   lidocaine (XYLOCAINE) 2 % (with pres) injection 400 mg   midazolam (VERSED) 5 MG/5ML injection 0.5-2 mg    Make sure Flumazenil is available in the pyxis when using this medication. If oversedation occurs, administer 0.2 mg IV over 15 sec. If after 45 sec no response, administer 0.2 mg again over 1 min; may repeat at  1 min intervals; not to exceed 4 doses (1 mg)   dexamethasone (DECADRON) injection 10 mg   ropivacaine (PF) 2  mg/mL (0.2%) (NAROPIN) injection 9 mL   Medications administered: We administered lidocaine, midazolam, dexamethasone, and ropivacaine (PF) 2 mg/mL (0.2%).  See the medical record for exact dosing, route, and time of administration.  Follow-up plan:   Return in about 4 weeks (around 04/19/2022) for Post Procedure Evaluation, virtual.       1.  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  Discussed treatment options which include titration of gabapentin to a more therapeutic dose.  Can also consider trial of Lyrica in future.  2.  Consider lidocaine infusion for right lower extremity phantom limb pain.  QTc checked from EKG in November 2022 and is appropriate.  Will need updated EKG if we are considering lidocaine infusion.  3.  Consider spinal cord stimulation for phantom limb pain.  Informed patient that there is mixed evidence on this but this could be a treatment option.  4.  Right rotator cuff arthropathy and dysfunction: Consider right suprascapular nerve block and possible RFA or suprascapular peripheral nerve stimulation.     Recent Visits Date Type Provider Dept  03/07/22 Office Visit Gillis Santa, MD [facility]  02/16/22 Office Visit Gillis Santa, MD [facility]  12/27/21 Office Visit Gillis Santa, MD [facility]  Showing recent visits within past 90 days and meeting all other requirements Today's Visits Date Type Provider Dept  03/22/22 Procedure visit Gillis Santa, MD [facility]  Showing today's visits and meeting all other requirements Future Appointments Date Type Provider Dept  04/26/22 Appointment Gillis Santa, MD [facility]  06/08/22 Appointment Gillis Santa, MD [facility]  Showing future appointments within next 90 days and meeting all other requirements  Disposition:  Discharge home  Discharge (Date  Time): 03/22/2022; 1351 hrs.   Primary Care Physician: Danelle Berry, NP Location: [facility] Note by: Gillis Santa, MD Date: 03/22/2022; Time: 2:49 PM  Disclaimer:  Medicine is not an exact science. The only guarantee in medicine is that nothing is guaranteed. It is important to note that the decision to proceed with this intervention was based on the information collected from the patient. The Data and conclusions were drawn from the patient's questionnaire, the interview, and the physical examination. Because the information was provided in large part by the patient, it cannot be guaranteed that it has not been purposely or unconsciously manipulated. Every effort has been made to obtain as much relevant data as possible for this evaluation. It is important to note that the conclusions that lead to this procedure are derived in large part from the available data. Always take into account that the treatment will also be dependent on availability of resources and existing treatment guidelines, considered by other Pain Management Practitioners as being common knowledge and practice, at the time of the intervention. For Medico-Legal purposes, it is also important to point out that variation in procedural techniques and pharmacological choices are the acceptable norm. The indications, contraindications, technique, and results of the above procedure should only be interpreted and judged by a Board-Certified Interventional Pain Specialist with extensive familiarity and expertise in the same exact procedure and technique.

## 2022-03-23 ENCOUNTER — Telehealth: Payer: Self-pay

## 2022-03-23 NOTE — Telephone Encounter (Signed)
Called patient Sara [name]. No problems noted at this time. Instructed to call back if needed.

## 2022-03-26 NOTE — Therapy (Signed)
OUTPATIENT PHYSICAL THERAPY SHOULDER/BACK TREATMENT   Patient Name: Sara [name] MRN: [id number] DOB:[birth year], 71 y.o., female Today's Date: 03/27/2022   PT End of Session - 03/27/22 1117     Visit Number 9    Number of Visits 25    Date for PT Re-Evaluation 05/09/22    Authorization Type eval: 02/14/22    PT Start Time 1134    PT Stop Time 1219    PT Time Calculation (min) 45 min    Activity Tolerance Patient tolerated treatment well    Behavior During Therapy [facility] for tasks assessed/performed               Past Medical History:  Diagnosis Date   Arthritis    right shoulder   Depression    Dyspnea    GERD (gastroesophageal reflux disease)    History of blood clots    Hyperlipidemia    Hypertension    Mild mitral regurgitation    Osteoporosis    Peripheral vascular disease ([place])    Stomach ulcer    Vascular disease    Sees Dr. Delana Meyer   Vertigo    Last episode approx Aug 2015   Wears dentures    full upper   Past Surgical History:  Procedure Laterality Date   ADENOIDECTOMY     AMPUTATION Right 08/11/2020   Procedure: AMPUTATION ABOVE KNEE;  Surgeon: [name], MD;  Location: [facility];  Service: Vascular;  Laterality: Right;   ARTERY BIOPSY Right 07/14/2019   Procedure: BIOPSY TEMPORAL ARTERY;  Surgeon: [name], MD;  Location: [facility];  Service: Vascular;  Laterality: Right;   BREAST CYST ASPIRATION Left    CARDIAC CATHETERIZATION  02/15/2017   [facility]   COLONOSCOPY WITH PROPOFOL N/A 10/22/2017   Procedure: COLONOSCOPY WITH PROPOFOL;  Surgeon: Lucilla Lame, MD;  Location: [place];  Service: Endoscopy;  Laterality: N/A;  specimens not taken--pt on Plavix will be brought back in after 7 days off med   COLONOSCOPY WITH PROPOFOL N/A 11/05/2017   Procedure: COLONOSCOPY WITH PROPOFOL;  Surgeon: Lucilla Lame, MD;  Location: [place];  Service: Endoscopy;  Laterality: N/A;   ESOPHAGOGASTRODUODENOSCOPY N/A 12/21/2014    Procedure: ESOPHAGOGASTRODUODENOSCOPY (EGD);  Surgeon: Lucilla Lame, MD;  Location: [place];  Service: Gastroenterology;  Laterality: N/A;   ESOPHAGOGASTRODUODENOSCOPY (EGD) WITH PROPOFOL N/A 02/08/2016   Procedure: ESOPHAGOGASTRODUODENOSCOPY (EGD) WITH PROPOFOL;  Surgeon: Lucilla Lame, MD;  Location: [facility];  Service: Endoscopy;  Laterality: N/A;   FASCIOTOMY Right 05/30/2019   Procedure: FASCIOTOMY;  Surgeon: [name], MD;  Location: [facility];  Service: Vascular;  Laterality: Right;   FASCIOTOMY CLOSURE Right 06/04/2019   Procedure: FASCIOTOMY CLOSURE;  Surgeon: [name], MD;  Location: [facility];  Service: Vascular;  Laterality: Right;   HEMORROIDECTOMY  2014   LOWER EXTREMITY ANGIOGRAPHY Right 05/30/2019   Procedure: LOWER EXTREMITY ANGIOGRAPHY;  Surgeon: [name], MD;  Location: [place] [facility];  Service: Cardiovascular;  Laterality: Right;   LOWER EXTREMITY ANGIOGRAPHY Right 07/02/2020   Procedure: LOWER EXTREMITY ANGIOGRAPHY;  Surgeon: [name], MD;  Location: [place] [facility];  Service: Cardiovascular;  Laterality: Right;   PERIPHERAL VASCULAR CATHETERIZATION N/A 02/09/2016   Procedure: Abdominal Aortogram w/Lower Extremity;  Surgeon: [name], MD;  Location: [place] [facility];  Service: Cardiovascular;  Laterality: N/A;   PERIPHERAL VASCULAR CATHETERIZATION Right 02/10/2016   Procedure: Lower Extremity Angiography;  Surgeon: [name], MD;  Location: [facility]  INVASIVE CV LAB;  Service: Cardiovascular;  Laterality: Right;   POLYPECTOMY  11/05/2017   Procedure: POLYPECTOMY;  Surgeon: Lucilla Lame, MD;  Location: [place];  Service: Endoscopy;;   SHOULDER ARTHROSCOPY WITH ROTATOR CUFF REPAIR AND SUBACROMIAL DECOMPRESSION Right 02/27/2020   Procedure: RIGHT SHOULDER ARTHROSCOPY SUBACROMIAL DECOMPRESSION, DISTAL CLAVICLE EXCISION AND MINI-OPEN ROTATOR CUFF REPAIR;  Surgeon: Thornton Park, MD;  Location:  [facility];  Service: Orthopedics;  Laterality: Right;   TONSILLECTOMY     VASCULAR SURGERY  2202,5427   Fem-Pop Bypass   Patient Active Problem List   Diagnosis Date Noted   Degeneration of lumbar intervertebral disc 01/04/2022   Shoulder pain 01/04/2022   Pain in joint of right shoulder 01/04/2022   Spasm of back muscles 01/04/2022   Thoracic back pain 01/04/2022   History of left above knee amputation [facility]) (Dec 2021) 12/27/2021   Rotator cuff arthropathy of right shoulder 12/27/2021   Hx of rotator cuff surgery 12/27/2021   Chronic pain syndrome 12/27/2021   Adhesive capsulitis of right shoulder 12/05/2021   Phantom pain ([place]) 11/10/2021   Muscle spasm 09/09/2020   S/P AKA (above knee amputation), right ([place]) 09/08/2020   Ischemia of extremity 07/02/2020   Chronic pain in right shoulder 11/25/2019   Encounter for long-term (current) use of high-risk medication 07/16/2019   GCA (giant cell arteritis) ([place]) 07/16/2019   Temporal arteritis ([place]) 07/13/2019   Postoperative wound infection 06/23/2019   Ischemic leg 05/31/2019   Atherosclerosis of native arteries of extremity with intermittent claudication ([place]) 05/30/2019   Depression 05/29/2019   Eczema of lower extremity 03/04/2018   Chronic venous insufficiency 03/02/2018   Personal history of colonic polyps    Family history of colonic polyps    Benign neoplasm of ascending colon    Mitral valve insufficiency 02/08/2017   Dyspnea on exertion 02/07/2017   Non-rheumatic mitral regurgitation 02/07/2017   Precordial pain 02/07/2017   CAD (coronary artery disease) 12/21/2016   Essential hypertension 12/21/2016   Compression fracture of lumbar spine, non-traumatic ([place]) 04/26/2016   Compression fracture of L3 lumbar vertebra 04/20/2016   PVD (peripheral vascular disease) ([place]) 02/24/2016   Family history of premature CAD 02/24/2016   Gastritis    Other specified diseases of esophagus    Hiatal hernia    Gastritis and  gastroduodenitis    Ischemia of lower extremity    Arterial occlusion ([place])    Atherosclerosis of aorta ([place])    History of smoking    Hyperlipidemia    Pain in the chest    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 02/05/2016   Carotid artery stenosis 01/04/2016   Vertigo, benign paroxysmal 12/28/2015   Clinical depression 09/06/2015   History of alcoholism ([place]) 09/06/2015   Angiopathy, 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[place]) 09/06/2015   Atherosclerosis of native arteries of extremity with rest pain ([place]) 09/06/2015   Acute non-recurrent maxillary sinusitis 07/14/2015   Vaginal pruritus 05/26/2015   Chronic recurrent major depressive disorder ([place]) 03/09/2015   Osteoporosis, post-menopausal 03/09/2015   Peripheral blood vessel disorder ([place]) 03/09/2015   Acid reflux 03/09/2015   GERD (gastroesophageal reflux disease) 12/21/2014   Carotid artery narrowing 01/28/2014   Peripheral arterial occlusive disease ([place]) 06/08/2011   Occlusion and stenosis of unspecified carotid artery 06/08/2011   PAD (peripheral artery disease) ([place]) 06/08/2011    PCP: Danelle Berry, NP  REFERRING PROVIDER: Barrie Lyme MD  REFERRING DIAGNOSIS: S29.012A (ICD-10-CM) - Strain of muscle and tendon of back wall of thorax, initial  encounter  THERAPY DIAG: Other low back pain  Muscle weakness (generalized)  RATIONALE FOR EVALUATION AND TREATMENT: Rehabilitation  ONSET DATE: 08/11/20 (approximate)  FROM INITIAL EVALUATION  SUBJECTIVE:                                                                                                                                                                                         Chief Complaint: R thoracic/rib pain  Pertinent History Pt reports she she started experiencing R shoulder pain which started shortly after her RLE amputation (08/11/20). She attributes the pain to having to self-propel a wheelchair for an extended period of time. She has seen Dr.  Mack Guise at [facility] in the past and underwent a R shoulder mini-open rotator cuff repair, DCE, and SAD on 02/27/20. Since the pain started she has undergone R shoulder injections without improvement in her pain. When pt describes her pain she points to the area below her R shoulder blade around T10/T11. She denies weakness in the RUE or any pain in the area of the [facility] joint or shoulder pain. Denies N/T in back or RUE. Pain worsens with extended use of RUE especially when cleaning the counters in her apartment or sweeping the floor. She has a history of L1 and L3 compression fractures in 2017. She currently sees pain management for her R shoulder pain and takes oxycodone to manage the pain. She also struggles with chronic RLE phantom pain s/p amputation and takes gabapentin for this issue. She has a history of multiple vascular surgeries prior to her amputation and also has a history of carpal tunnel syndrome.  Pain:  Pain Intensity: Not rated today Pain location: R back/flank inferior to the shoulder blade Pain Quality: sharp  Radiating: No  Numbness/Tingling: No Focal Weakness: No Aggravating factors: Excessive use of RUE, mopping the floors, wiping countertops, no pain with driving, Relieving factors: heating pad, never tried lidocaine patch, oxycodone, gabapentin doesn't help with R flank/back pain; 24-hour pain behavior: varies depending on activity History of prior shoulder or neck/shoulder injury, pain, surgery, or therapy: Yes Falls: Has patient fallen in last 6 months? 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  Recent memory is intact.  Remote memory is intact.  Attention span and concentration are intact.  Expressive speech is intact.  Patient's fund of knowledge is within normal limits for educational level.    Gross Musculoskeletal Assessment Tremor: None Bulk: Normal Tone: Normal  Gait Pt ambulates with RLE prosthesis and lofstrand in LUE, decreased self-selected gait speed;  Posture Increased thoracic kyphosis, forward head, and rounded shoulder in sitting and standing;  Cervical Screen AROM: WFL and painless with overpressure in all planes Spurlings A (ipsilateral lateral flexion/axial compression): R: Negative L: Negative Spurlings B (ipsilateral lateral flexion/contralateral rotation/axial compression): R: Negative L: Negative Repeated movement: Not examined Hoffman Sign (cervical cord compression): R: Not examined L: Not examined ULTT Median: R: Not examined L: Not examined ULTT Ulnar: R: Not examined L: Not examined ULTT Radial: R: Not examined L: Not examined   AROM Cervical AROM is painless in all directions and WFL although not full. No reproduction of pain with AROM R shoulder flexion and abduction which is also limited but WFL. No gross deficits noted. Pt denies pain with thoracic flexion, extension, rotation, and lateral flexion.    LE MMT:  MMT (out of 5) Right 03/27/2022 Left 03/27/2022  Cervical (isometric)  Flexion WNL  Extension WNL  Lateral Flexion WNL WNL  Rotation WNL WNL      Shoulder   Flexion 4+ 4+  Extension    Abduction 4+ 4+  External rotation (seated) 4+ 4+  Internal  rotation (seated) 4+ 4+  Horizontal abduction    Horizontal adduction    Lower Trapezius    Rhomboids        Elbow  Flexion 5 5  Extension 5 5  Pronation    Supination        Wrist  Flexion 5 5  Extension 5 5  Radial deviation    Ulnar deviation        MCP  Flexion    Extension    Abduction 5 5  Adduction 5 5  (* = pain; Blank rows = not tested)  Sensation Grossly intact to light touch bilateral UE as determined by testing dermatomes C2-T2. Proprioception and hot/cold testing deferred on this date.  Reflexes Deferred   Palpation Pt is nontender to palpation around entire R shoulder girdle anterior, lateral, and posterior. No pain with palpation to R cervical paraspinals, upper trap, rhomboids, or mid trap. She has pain to palpation along spinous processes from T9-L3 as well as along paraspinals in this region on the right.   Repeated Movements Deferred  Passive Accessory Intervertebral Motion Pt reports reproduction of pain with CPA and bilateral UPA T9-L3. Unable to fully assess mobility secondary to pain however appear grossly hypomobile with notable thoracic kyphosis when prone.   SPECIAL TESTS Rotator Cuff  Drop Arm Test: Negative Painful Arc (Pain from 60 to 120 degrees scaption): Negative Infraspinatus Muscle Test: Negative   Subacromial Impingement Hawkins-Kennedy: Negative Neer (Block scapula, PROM flexion): Negative Painful Arc (Pain from 60 to 120 degrees scaption): Negative Empty Can: Negative External Rotation Resistance: Negative Horizontal Adduction: Not examined Scapular Assist: Not examined   Labral Tear Biceps Load II (120 elevation, full ER, 90 elbow flexion, full supination, resisted elbow flexion): Not examined Crank (160 scaption, axial load with IR/ER): Not examined Active Compression Test: Not examined  Bicep Tendon Pathology Speed (shoulder flexion to 90, external rotation, full elbow extension, and forearm supination with  resistance: Not examined Yergason's (resisted shoulder ER and supination/biceps tendon pathology): Not examined  Shoulder Instability Sulcus Sign: Not examined Anterior Apprehension: Not examined  Beighton scale: Deferred   Outcome Measures Quick DASH: Deferred FOTO: Pt completed shoulder FOTO but this appears to be more related to back pain. Will update at next therapy session.    TODAY'S TREATMENT (03/20/22)   SUBJECTIVE: Pt denies any back/"R shoulder" pain upon arrival today. She underwent L2, L3, L4 medial branch block on 03/22/22. She reports improvement in her low back pain but no change in her lower thoracic/R flank pain. No change in phantom pain. Denies redness, chills, or fevers since the injections. No specific questions currently.  PAIN: Denies  Ther-ex: Prone alternating hip extension 2 x 15 BLE; Supine SLR with manual resistance on R side 2 x 10 BLE; Supine bolster bridges 2 x 10; Sidelying hip abduction with manual resistance 2 x 10 BLE; Seated shoulder flexion with 3# dumbbell 2 x 10; Seated shoulder scaption with 3# dumbbell 2 x 10; Seated scapular retractions 2 x 10;   Manual Therapy  Moist heat pack applied to R thoracic spine/flank with pt in prone at start of session x 5 minutes (unbilled); Extensive STM to R thoracic/upper lumbar erector spinae group and latissimus with effeurage, shingling, and use of Theraband Roller x 10 minutes;   Not performed: Prone R shoulder extension with 2# dumbbell (DB) 2 x 10; Prone R horizontal abduction with 2# DB 2 x 10; Prone R low trap flexion (Y) with 2# DB 2 x 10; Prone R low row with 2# DB 2 x 10; Prone R shoulder ER with 2# DB 2 x 10; Seated pallof press with green tband 2 x 10 toward each side; Seated rows with green tband 2 x 10; Seated dynamic hug with blue tband 2 x 10;   PATIENT EDUCATION:  Education details: Pt educated throughout session about proper posture and technique with exercises. Improved  exercise technique, movement at target joints, use of target muscles after min to mod verbal, visual, tactile cues.  Person educated: Patient Education method: Explanation, verbal cues, tactile cues, and demonstration Education comprehension: verbalized understanding and returned demonstration;   HOME EXERCISE PROGRAM: None currently   ASSESSMENT:  CLINICAL IMPRESSION: Pt reporting no pain upon arrival today. Lumbar injections helped with low back pain but no help in thoracic/R flank pain. No change in phantom pain. Continued with STM to improve muscle extensibility. Pt remains pain-free to palpation today. Hip, abdominal, and upper quarter strengthening repeated again during session. Pt denies any pain throughout exercises today. Pt encouraged to discuss with Dr. Holley Raring whether a thoracic MRI would be helpful. Pt encouraged to follow-up as scheduled. She will benefit from PT services to address deficits in strength and pain in order to return to full function at home with less pain.    REHAB POTENTIAL: Fair    CLINICAL DECISION MAKING: Unstable/unpredictable  EVALUATION COMPLEXITY: High   GOALS: Goals reviewed with patient? Yes  SHORT TERM GOALS: Target date:  03/14/2022  Pt will be independent with HEP to improve strength and decrease back pain to improve pain-free function at home. Baseline:  Goal status: INITIAL   LONG TERM GOALS: Target date: 04/11/2022  Pt will increase FOTO to at least 57 in order to demonstrate significant improvement in function at home and work related to back pain  Baseline: 02/14/22:  To be completed at next visit; 02/22/22: 45 Goal status: INITIAL  2.  Pt will decrease worst back pain by at least 2 points on the NPRS in order to demonstrate clinically significant reduction in back pain. Baseline: 02/14/22: To be completed at next visit Goal status: INITIAL  3.  Pt will report at least 50% improvement in her back symptoms in order to demonstrate  clinically significant reduction in disability related to back pain;        Baseline:  Goal status: INITIAL   PLAN: PT FREQUENCY: 1-2x/week  PT DURATION: 8 weeks  PLANNED INTERVENTIONS: Therapeutic exercises, Therapeutic activity, Neuromuscular re-education, Balance training, Gait training, Patient/Family education, Joint manipulation, Joint mobilization, Vestibular training, Canalith repositioning, Aquatic Therapy, Dry Needling, Electrical stimulation, Spinal manipulation, Spinal mobilization, Cryotherapy, Moist heat, Traction, Ultrasound, Ionotophoresis '4mg'$ /ml Dexamethasone, and Manual therapy  PLAN FOR NEXT SESSION: Continue manual techniques for R sided back pain, continue stretching/strengthening;    Phillips Grout PT, DPT, GCS  Physical Therapist- [facility]  03/27/2022, 1:09 PM

## 2022-03-27 ENCOUNTER — Ambulatory Visit: Payer: Medicare Other | Attending: Orthopedic Surgery

## 2022-03-27 DIAGNOSIS — M6281 Muscle weakness (generalized): Secondary | ICD-10-CM | POA: Diagnosis not present

## 2022-03-27 DIAGNOSIS — M5459 Other low back pain: Secondary | ICD-10-CM | POA: Insufficient documentation

## 2022-03-27 DIAGNOSIS — M25511 Pain in right shoulder: Secondary | ICD-10-CM | POA: Diagnosis not present

## 2022-03-27 DIAGNOSIS — G8929 Other chronic pain: Secondary | ICD-10-CM | POA: Insufficient documentation

## 2022-03-27 NOTE — Therapy (Signed)
OUTPATIENT PHYSICAL THERAPY SHOULDER/BACK TREATMENT/PROGRESS NOTE  Dates of reporting period  02/14/22   to   03/29/22    Patient Name: Sara [name] MRN: [id number] DOB:1951-08-07, 71 y.o., female Today's Date: 03/29/2022   PT End of Session - 03/29/22 1154     Visit Number 10    Number of Visits 25    Date for PT Re-Evaluation 05/09/22    Authorization Type eval: 02/14/22    PT Start Time 1149    PT Stop Time 1230    PT Time Calculation (min) 41 min    Activity Tolerance Patient tolerated treatment well    Behavior During Therapy [facility] for tasks assessed/performed             Past Medical History:  Diagnosis Date   Arthritis    right shoulder   Depression    Dyspnea    GERD (gastroesophageal reflux disease)    History of blood clots    Hyperlipidemia    Hypertension    Mild mitral regurgitation    Osteoporosis    Peripheral vascular disease ([place])    Stomach ulcer    Vascular disease    Sees Dr. Delana Meyer   Vertigo    Last episode approx Aug 2015   Wears dentures    full upper   Past Surgical History:  Procedure Laterality Date   ADENOIDECTOMY     AMPUTATION Right 08/11/2020   Procedure: AMPUTATION ABOVE KNEE;  Surgeon: [name], MD;  Location: [facility];  Service: Vascular;  Laterality: Right;   ARTERY BIOPSY Right 07/14/2019   Procedure: BIOPSY TEMPORAL ARTERY;  Surgeon: [name], MD;  Location: [facility];  Service: Vascular;  Laterality: Right;   BREAST CYST ASPIRATION Left    CARDIAC CATHETERIZATION  02/15/2017   [facility]   COLONOSCOPY WITH PROPOFOL N/A 10/22/2017   Procedure: COLONOSCOPY WITH PROPOFOL;  Surgeon: Lucilla Lame, MD;  Location: [place];  Service: Endoscopy;  Laterality: N/A;  specimens not taken--pt on Plavix will be brought back in after 7 days off med   COLONOSCOPY WITH PROPOFOL N/A 11/05/2017   Procedure: COLONOSCOPY WITH PROPOFOL;  Surgeon: Lucilla Lame, MD;  Location: [place];  Service: Endoscopy;   Laterality: N/A;   ESOPHAGOGASTRODUODENOSCOPY N/A 12/21/2014   Procedure: ESOPHAGOGASTRODUODENOSCOPY (EGD);  Surgeon: Lucilla Lame, MD;  Location: [place];  Service: Gastroenterology;  Laterality: N/A;   ESOPHAGOGASTRODUODENOSCOPY (EGD) WITH PROPOFOL N/A 02/08/2016   Procedure: ESOPHAGOGASTRODUODENOSCOPY (EGD) WITH PROPOFOL;  Surgeon: Lucilla Lame, MD;  Location: [facility];  Service: Endoscopy;  Laterality: N/A;   FASCIOTOMY Right 05/30/2019   Procedure: FASCIOTOMY;  Surgeon: [name], MD;  Location: [facility];  Service: Vascular;  Laterality: Right;   FASCIOTOMY CLOSURE Right 06/04/2019   Procedure: FASCIOTOMY CLOSURE;  Surgeon: [name], MD;  Location: [facility];  Service: Vascular;  Laterality: Right;   HEMORROIDECTOMY  2014   LOWER EXTREMITY ANGIOGRAPHY Right 05/30/2019   Procedure: LOWER EXTREMITY ANGIOGRAPHY;  Surgeon: [name], MD;  Location: [place] [facility];  Service: Cardiovascular;  Laterality: Right;   LOWER EXTREMITY ANGIOGRAPHY Right 07/02/2020   Procedure: LOWER EXTREMITY ANGIOGRAPHY;  Surgeon: [name], MD;  Location: [place] [facility];  Service: Cardiovascular;  Laterality: Right;   PERIPHERAL VASCULAR CATHETERIZATION N/A 02/09/2016   Procedure: Abdominal Aortogram w/Lower Extremity;  Surgeon: [name], MD;  Location: [place] [facility];  Service: Cardiovascular;  Laterality: N/A;   PERIPHERAL VASCULAR CATHETERIZATION Right 02/10/2016  Procedure: Lower Extremity Angiography;  Surgeon: [name], MD;  Location: [place] [facility];  Service: Cardiovascular;  Laterality: Right;   POLYPECTOMY  11/05/2017   Procedure: POLYPECTOMY;  Surgeon: Lucilla Lame, MD;  Location: [place];  Service: Endoscopy;;   SHOULDER ARTHROSCOPY WITH ROTATOR CUFF REPAIR AND SUBACROMIAL DECOMPRESSION Right 02/27/2020   Procedure: RIGHT SHOULDER ARTHROSCOPY SUBACROMIAL DECOMPRESSION, DISTAL CLAVICLE EXCISION AND MINI-OPEN  ROTATOR CUFF REPAIR;  Surgeon: Thornton Park, MD;  Location: [facility];  Service: Orthopedics;  Laterality: Right;   TONSILLECTOMY     VASCULAR SURGERY  [id number]   Fem-Pop Bypass   Patient Active Problem List   Diagnosis Date Noted   Degeneration of lumbar intervertebral disc 01/04/2022   Shoulder pain 01/04/2022   Pain in joint of right shoulder 01/04/2022   Spasm of back muscles 01/04/2022   Thoracic back pain 01/04/2022   History of left above knee amputation [facility]) (Dec 2021) 12/27/2021   Rotator cuff arthropathy of right shoulder 12/27/2021   Hx of rotator cuff surgery 12/27/2021   Chronic pain syndrome 12/27/2021   Adhesive capsulitis of right shoulder 12/05/2021   Phantom pain ([place]) 11/10/2021   Muscle spasm 09/09/2020   S/P AKA (above knee amputation), right ([place]) 09/08/2020   Ischemia of extremity 07/02/2020   Chronic pain in right shoulder 11/25/2019   Encounter for long-term (current) use of high-risk medication 07/16/2019   GCA (giant cell arteritis) ([place]) 07/16/2019   Temporal arteritis ([place]) 07/13/2019   Postoperative wound infection 06/23/2019   Ischemic leg 05/31/2019   Atherosclerosis of native arteries of extremity with intermittent claudication ([place]) 05/30/2019   Depression 05/29/2019   Eczema of lower extremity 03/04/2018   Chronic venous insufficiency 03/02/2018   Personal history of colonic polyps    Family history of colonic polyps    Benign neoplasm of ascending colon    Mitral valve insufficiency 02/08/2017   Dyspnea on exertion 02/07/2017   Non-rheumatic mitral regurgitation 02/07/2017   Precordial pain 02/07/2017   CAD (coronary artery disease) 12/21/2016   Essential hypertension 12/21/2016   Compression fracture of lumbar spine, non-traumatic ([place]) 04/26/2016   Compression fracture of L3 lumbar vertebra 04/20/2016   PVD (peripheral vascular disease) ([place]) 02/24/2016   Family history of premature CAD 02/24/2016   Gastritis    Other specified  diseases of esophagus    Hiatal hernia    Gastritis and gastroduodenitis    Ischemia of lower extremity    Arterial occlusion ([place])    Atherosclerosis of aorta ([place])    History of smoking    Hyperlipidemia    Pain in the chest    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 02/05/2016   Carotid artery stenosis 01/04/2016   Vertigo, benign paroxysmal 12/28/2015   Clinical depression 09/06/2015   History of alcoholism ([place]) 09/06/2015   Angiopathy, 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[place]) 09/06/2015   Atherosclerosis of native arteries of extremity with rest pain ([place]) 09/06/2015   Acute non-recurrent maxillary sinusitis 07/14/2015   Vaginal pruritus 05/26/2015   Chronic recurrent major depressive disorder ([place]) 03/09/2015   Osteoporosis, post-menopausal 03/09/2015   Peripheral blood vessel disorder ([place]) 03/09/2015   Acid reflux 03/09/2015   GERD (gastroesophageal reflux disease) 12/21/2014   Carotid artery narrowing 01/28/2014   Peripheral arterial occlusive disease ([place]) 06/08/2011   Occlusion and stenosis of unspecified carotid artery 06/08/2011   PAD (peripheral artery disease) ([place]) 06/08/2011    PCP: Danelle Berry, NP  REFERRING PROVIDER: Barrie Lyme MD  REFERRING DIAGNOSIS: S29.012A (  ICD-10-CM) - Strain of muscle and tendon of back wall of thorax, initial encounter  THERAPY DIAG: Other low back pain  Muscle weakness (generalized)  RATIONALE FOR EVALUATION AND TREATMENT: Rehabilitation  ONSET DATE: 08/11/20 (approximate)  FROM INITIAL EVALUATION  SUBJECTIVE:                                                                                                                                                                                         Chief Complaint: R thoracic/rib pain  Pertinent History Pt reports she she started experiencing R shoulder pain which started shortly after her RLE amputation (08/11/20). She attributes the pain to having to self-propel a  wheelchair for an extended period of time. She has seen Dr. Mack Guise at [facility] in the past and underwent a R shoulder mini-open rotator cuff repair, DCE, and SAD on 02/27/20. Since the pain started she has undergone R shoulder injections without improvement in her pain. When pt describes her pain she points to the area below her R shoulder blade around T10/T11. She denies weakness in the RUE or any pain in the area of the [facility] joint or shoulder pain. Denies N/T in back or RUE. Pain worsens with extended use of RUE especially when cleaning the counters in her apartment or sweeping the floor. She has a history of L1 and L3 compression fractures in 2017. She currently sees pain management for her R shoulder pain and takes oxycodone to manage the pain. She also struggles with chronic RLE phantom pain s/p amputation and takes gabapentin for this issue. She has a history of multiple vascular surgeries prior to her amputation and also has a history of carpal tunnel syndrome.  Pain:  Pain Intensity: Not rated today Pain location: R back/flank inferior to the shoulder blade Pain Quality: sharp  Radiating: No  Numbness/Tingling: No Focal Weakness: No Aggravating factors: Excessive use of RUE, mopping the floors, wiping countertops, no pain with driving, Relieving factors: heating pad, never tried lidocaine patch, oxycodone, gabapentin doesn't help with R flank/back pain; 24-hour pain behavior: varies depending on activity History of prior shoulder or neck/shoulder injury, pain, surgery, or therapy: Yes Falls: Has patient fallen in last 6 months? 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  Recent memory is intact.  Remote memory is intact.  Attention span and concentration are intact.  Expressive speech is intact.  Patient's fund of knowledge is within normal limits for educational level.    Gross Musculoskeletal Assessment Tremor: None Bulk: Normal Tone: Normal  Gait Pt ambulates with RLE prosthesis and lofstrand in LUE, decreased self-selected gait speed;  Posture Increased thoracic kyphosis, forward head, and rounded shoulder in sitting and standing;  Cervical Screen AROM: WFL and painless with overpressure in all planes Spurlings A (ipsilateral lateral flexion/axial compression): R: Negative L: Negative Spurlings B (ipsilateral lateral flexion/contralateral rotation/axial compression): R: Negative L: Negative Repeated movement: Not examined Hoffman Sign (cervical cord compression): R: Not examined L: Not examined ULTT Median: R: Not examined L: Not examined ULTT Ulnar: R: Not examined L: Not examined ULTT Radial: R: Not examined L: Not examined   AROM Cervical AROM is painless in all directions and WFL although not full. No reproduction of pain with AROM R shoulder flexion and abduction which is also limited but WFL. No gross deficits noted. Pt denies pain with thoracic flexion, extension, rotation, and lateral flexion.    LE MMT:  MMT (out of 5) Right 03/29/2022 Left 03/29/2022  Cervical (isometric)  Flexion WNL  Extension WNL  Lateral Flexion WNL WNL  Rotation WNL WNL      Shoulder   Flexion 4+ 4+  Extension     Abduction 4+ 4+  External rotation (seated) 4+ 4+  Internal rotation (seated) 4+ 4+  Horizontal abduction    Horizontal adduction    Lower Trapezius    Rhomboids        Elbow  Flexion 5 5  Extension 5 5  Pronation    Supination        Wrist  Flexion 5 5  Extension 5 5  Radial deviation    Ulnar deviation        MCP  Flexion    Extension    Abduction 5 5  Adduction 5 5  (* = pain; Blank rows = not tested)  Sensation Grossly intact to light touch bilateral UE as determined by testing dermatomes C2-T2. Proprioception and hot/cold testing deferred on this date.  Reflexes Deferred   Palpation Pt is nontender to palpation around entire R shoulder girdle anterior, lateral, and posterior. No pain with palpation to R cervical paraspinals, upper trap, rhomboids, or mid trap. She has pain to palpation along spinous processes from T9-L3 as well as along paraspinals in this region on the right.   Repeated Movements Deferred  Passive Accessory Intervertebral Motion Pt reports reproduction of pain with CPA and bilateral UPA T9-L3. Unable to fully assess mobility secondary to pain however appear grossly hypomobile with notable thoracic kyphosis when prone.   SPECIAL TESTS Rotator Cuff  Drop Arm Test: Negative Painful Arc (Pain from 60 to 120 degrees scaption): Negative Infraspinatus Muscle Test: Negative   Subacromial Impingement Hawkins-Kennedy: Negative Neer (Block scapula, PROM flexion): Negative Painful Arc (Pain from 60 to 120 degrees scaption): Negative Empty Can: Negative External Rotation Resistance: Negative Horizontal Adduction: Not examined Scapular Assist: Not examined   Labral Tear Biceps Load II (120 elevation, full ER, 90 elbow flexion, full supination, resisted elbow flexion): Not examined Crank (  160 scaption, axial load with IR/ER): Not examined Active Compression Test: Not examined  Bicep Tendon Pathology Speed (shoulder flexion to 90, external  rotation, full elbow extension, and forearm supination with resistance: Not examined Yergason's (resisted shoulder ER and supination/biceps tendon pathology): Not examined  Shoulder Instability Sulcus Sign: Not examined Anterior Apprehension: Not examined  Beighton scale: Deferred   Outcome Measures Quick DASH: Deferred FOTO: Pt completed shoulder FOTO but this appears to be more related to back pain. Will update at next therapy session.    TODAY'S TREATMENT (03/29/22)   SUBJECTIVE: Pt denies any back/"R shoulder" pain upon arrival today. Continued improvement in her low back pain and pt reports at least 80% improvement in thoracic/flank symptoms since starting therapy. No change in phantom pain. She had an appointment with [facility] and denies any changes in medications or plan of care. No specific questions currently.  PAIN: Denies  Ther-ex: Prone alternating hip extension 2 x 15 BLE (attempted to add opposite arm lifts but pain increases so discontinued); Prone R shoulder extension with 3# dumbbell (DB) 2 x 10; Prone R low row with 3# DB 2 x 10; Prone R horizontal abduction with 3# DB 2 x 10; Prone R low trap flexion (Y) with 3# DB 2 x 10;   Manual Therapy  Moist heat pack applied to R thoracic spine/flank with pt in prone at start of session x 5 minutes (unbilled); Extensive STM to R thoracic/upper lumbar erector spinae group and latissimus with effeurage, shingling, and use of Theraband Roller x 10 minutes;   Not performed: Supine SLR with manual resistance on R side 2 x 10 BLE; Supine bolster bridges 2 x 10; Sidelying hip abduction with manual resistance 2 x 10 BLE; Prone R shoulder ER with 3# DB 2 x 10; Seated pallof press with green tband 2 x 10 toward each side; Seated rows with green tband 2 x 10; Seated dynamic hug with blue tband 2 x 10; Seated shoulder flexion with 3# dumbbell 2 x 10; Seated shoulder scaption with 3# dumbbell 2 x 10; Seated scapular  retractions 2 x 10;    PATIENT EDUCATION:  Education details: Pt educated throughout session about proper posture and technique with exercises. Improved exercise technique, movement at target joints, use of target muscles after min to mod verbal, visual, tactile cues.  Person educated: Patient Education method: Explanation, verbal cues, tactile cues, and demonstration Education comprehension: verbalized understanding and returned demonstration;   HOME EXERCISE PROGRAM: None currently   ASSESSMENT:  CLINICAL IMPRESSION: Pt reporting no pain upon arrival today. She reports approximately 80% improvement in thoracic/R flank symptoms since starting with therapy however it continues to flare reaching 10/10 occasionally. She is able to self-manage with medication and heating pad. Lumbar injections helped significantly with her low back pain but no help in thoracic/R flank pain. Continued with STM to improve muscle extensibility. Pt remains pain-free to palpation today. Right upper quarter strengthening repeated again during session. Pt encouraged to follow-up as scheduled. She will benefit from PT services to address deficits in strength and pain in order to return to full function at home with less pain.    REHAB POTENTIAL: Fair    CLINICAL DECISION MAKING: Unstable/unpredictable  EVALUATION COMPLEXITY: High   GOALS: Goals reviewed with patient? Yes  SHORT TERM GOALS: Target date:  03/14/2022  Pt will be independent with HEP to improve strength and decrease back pain to improve pain-free function at home. Baseline:  Goal status: INITIAL   LONG  TERM GOALS: Target date: 04/11/2022  Pt will increase FOTO to at least 57 in order to demonstrate significant improvement in function at home and work related to back pain  Baseline: 02/14/22: To be completed at next visit; 02/22/22: 45; 03/29/22: Deferred Goal status: INITIAL  2.  Pt will decrease worst back pain by at least 2 points on the NPRS  in order to demonstrate clinically significant reduction in back pain. Baseline: 02/14/22: To be completed at next visit; 03/29/22: 10/10; Goal status: INITIAL  3.  Pt will report at least 50% improvement in her back symptoms in order to demonstrate clinically significant reduction in disability related to back pain;        Baseline: 03/29/22: 80% improvement since starting with therapy; Goal status: INITIAL   PLAN: PT FREQUENCY: 1-2x/week  PT DURATION: 8 weeks  PLANNED INTERVENTIONS: Therapeutic exercises, Therapeutic activity, Neuromuscular re-education, Balance training, Gait training, Patient/Family education, Joint manipulation, Joint mobilization, Vestibular training, Canalith repositioning, Aquatic Therapy, Dry Needling, Electrical stimulation, Spinal manipulation, Spinal mobilization, Cryotherapy, Moist heat, Traction, Ultrasound, Ionotophoresis '4mg'$ /ml Dexamethasone, and Manual therapy  PLAN FOR NEXT SESSION: Continue manual techniques for R sided back pain, continue stretching/strengthening;    Phillips Grout PT, DPT, GCS  Physical Therapist- [facility]  03/29/2022, 1:24 PM

## 2022-03-29 ENCOUNTER — Ambulatory Visit: Payer: Medicare Other

## 2022-03-29 DIAGNOSIS — M25511 Pain in right shoulder: Secondary | ICD-10-CM | POA: Diagnosis not present

## 2022-03-29 DIAGNOSIS — M5459 Other low back pain: Secondary | ICD-10-CM

## 2022-03-29 DIAGNOSIS — M546 Pain in thoracic spine: Secondary | ICD-10-CM | POA: Diagnosis not present

## 2022-03-29 DIAGNOSIS — Z79891 Long term (current) use of opiate analgesic: Secondary | ICD-10-CM | POA: Diagnosis not present

## 2022-03-29 DIAGNOSIS — M25519 Pain in unspecified shoulder: Secondary | ICD-10-CM | POA: Diagnosis not present

## 2022-03-29 DIAGNOSIS — Z79899 Other long term (current) drug therapy: Secondary | ICD-10-CM | POA: Diagnosis not present

## 2022-03-29 DIAGNOSIS — M6281 Muscle weakness (generalized): Secondary | ICD-10-CM

## 2022-03-29 DIAGNOSIS — M62830 Muscle spasm of back: Secondary | ICD-10-CM | POA: Diagnosis not present

## 2022-03-29 DIAGNOSIS — M5136 Other intervertebral disc degeneration, lumbar region: Secondary | ICD-10-CM | POA: Diagnosis not present

## 2022-03-29 DIAGNOSIS — G8929 Other chronic pain: Secondary | ICD-10-CM | POA: Diagnosis not present

## 2022-03-29 DIAGNOSIS — G894 Chronic pain syndrome: Secondary | ICD-10-CM | POA: Diagnosis not present

## 2022-03-30 DIAGNOSIS — I10 Essential (primary) hypertension: Secondary | ICD-10-CM | POA: Diagnosis not present

## 2022-03-30 DIAGNOSIS — E785 Hyperlipidemia, unspecified: Secondary | ICD-10-CM | POA: Diagnosis not present

## 2022-03-30 DIAGNOSIS — I739 Peripheral vascular disease, unspecified: Secondary | ICD-10-CM | POA: Diagnosis not present

## 2022-03-30 DIAGNOSIS — I340 Nonrheumatic mitral (valve) insufficiency: Secondary | ICD-10-CM | POA: Diagnosis not present

## 2022-03-30 DIAGNOSIS — I2510 Atherosclerotic heart disease of native coronary artery without angina pectoris: Secondary | ICD-10-CM | POA: Diagnosis not present

## 2022-03-30 DIAGNOSIS — I351 Nonrheumatic aortic (valve) insufficiency: Secondary | ICD-10-CM | POA: Diagnosis not present

## 2022-03-31 NOTE — Therapy (Unsigned)
OUTPATIENT PHYSICAL THERAPY SHOULDER/BACK TREATMENT   Patient Name: Sara [name] MRN: [id number] DOB:June 29, 1951, 71 y.o., female Today's Date: 04/04/2022   PT End of Session - 04/03/22 1133     Visit Number 11    Number of Visits 25    Date for PT Re-Evaluation 05/09/22    Authorization Type eval: 02/14/22    PT Start Time 1145    PT Stop Time 1230    PT Time Calculation (min) 45 min    Activity Tolerance Patient tolerated treatment well    Behavior During Therapy [facility] for tasks assessed/performed              Past Medical History:  Diagnosis Date   Arthritis    right shoulder   Depression    Dyspnea    GERD (gastroesophageal reflux disease)    History of blood clots    Hyperlipidemia    Hypertension    Mild mitral regurgitation    Osteoporosis    Peripheral vascular disease ([place])    Stomach ulcer    Vascular disease    Sees Dr. Delana Meyer   Vertigo    Last episode approx Aug 2015   Wears dentures    full upper   Past Surgical History:  Procedure Laterality Date   ADENOIDECTOMY     AMPUTATION Right 08/11/2020   Procedure: AMPUTATION ABOVE KNEE;  Surgeon: [name], MD;  Location: [facility];  Service: Vascular;  Laterality: Right;   ARTERY BIOPSY Right 07/14/2019   Procedure: BIOPSY TEMPORAL ARTERY;  Surgeon: [name], MD;  Location: [facility];  Service: Vascular;  Laterality: Right;   BREAST CYST ASPIRATION Left    CARDIAC CATHETERIZATION  02/15/2017   [facility]   COLONOSCOPY WITH PROPOFOL N/A 10/22/2017   Procedure: COLONOSCOPY WITH PROPOFOL;  Surgeon: Lucilla Lame, MD;  Location: [place];  Service: Endoscopy;  Laterality: N/A;  specimens not taken--pt on Plavix will be brought back in after 7 days off med   COLONOSCOPY WITH PROPOFOL N/A 11/05/2017   Procedure: COLONOSCOPY WITH PROPOFOL;  Surgeon: Lucilla Lame, MD;  Location: [place];  Service: Endoscopy;  Laterality: N/A;   ESOPHAGOGASTRODUODENOSCOPY N/A 12/21/2014    Procedure: ESOPHAGOGASTRODUODENOSCOPY (EGD);  Surgeon: Lucilla Lame, MD;  Location: [place];  Service: Gastroenterology;  Laterality: N/A;   ESOPHAGOGASTRODUODENOSCOPY (EGD) WITH PROPOFOL N/A 02/08/2016   Procedure: ESOPHAGOGASTRODUODENOSCOPY (EGD) WITH PROPOFOL;  Surgeon: Lucilla Lame, MD;  Location: [facility];  Service: Endoscopy;  Laterality: N/A;   FASCIOTOMY Right 05/30/2019   Procedure: FASCIOTOMY;  Surgeon: [name], MD;  Location: [facility];  Service: Vascular;  Laterality: Right;   FASCIOTOMY CLOSURE Right 06/04/2019   Procedure: FASCIOTOMY CLOSURE;  Surgeon: [name], MD;  Location: [facility];  Service: Vascular;  Laterality: Right;   HEMORROIDECTOMY  2014   LOWER EXTREMITY ANGIOGRAPHY Right 05/30/2019   Procedure: LOWER EXTREMITY ANGIOGRAPHY;  Surgeon: [name], MD;  Location: [place] [facility];  Service: Cardiovascular;  Laterality: Right;   LOWER EXTREMITY ANGIOGRAPHY Right 07/02/2020   Procedure: LOWER EXTREMITY ANGIOGRAPHY;  Surgeon: [name], MD;  Location: [place] [facility];  Service: Cardiovascular;  Laterality: Right;   PERIPHERAL VASCULAR CATHETERIZATION N/A 02/09/2016   Procedure: Abdominal Aortogram w/Lower Extremity;  Surgeon: [name], MD;  Location: [place] [facility];  Service: Cardiovascular;  Laterality: N/A;   PERIPHERAL VASCULAR CATHETERIZATION Right 02/10/2016   Procedure: Lower Extremity Angiography;  Surgeon: [name], MD;  Location: [place]  [facility];  Service: Cardiovascular;  Laterality: Right;   POLYPECTOMY  11/05/2017   Procedure: POLYPECTOMY;  Surgeon: Lucilla Lame, MD;  Location: [place];  Service: Endoscopy;;   SHOULDER ARTHROSCOPY WITH ROTATOR CUFF REPAIR AND SUBACROMIAL DECOMPRESSION Right 02/27/2020   Procedure: RIGHT SHOULDER ARTHROSCOPY SUBACROMIAL DECOMPRESSION, DISTAL CLAVICLE EXCISION AND MINI-OPEN ROTATOR CUFF REPAIR;  Surgeon: Thornton Park, MD;  Location:  [facility];  Service: Orthopedics;  Laterality: Right;   TONSILLECTOMY     VASCULAR SURGERY  [id number]   Fem-Pop Bypass   Patient Active Problem List   Diagnosis Date Noted   Degeneration of lumbar intervertebral disc 01/04/2022   Shoulder pain 01/04/2022   Pain in joint of right shoulder 01/04/2022   Spasm of back muscles 01/04/2022   Thoracic back pain 01/04/2022   History of left above knee amputation [facility]) (Dec 2021) 12/27/2021   Rotator cuff arthropathy of right shoulder 12/27/2021   Hx of rotator cuff surgery 12/27/2021   Chronic pain syndrome 12/27/2021   Adhesive capsulitis of right shoulder 12/05/2021   Phantom pain ([place]) 11/10/2021   Muscle spasm 09/09/2020   S/P AKA (above knee amputation), right ([place]) 09/08/2020   Ischemia of extremity 07/02/2020   Chronic pain in right shoulder 11/25/2019   Encounter for long-term (current) use of high-risk medication 07/16/2019   GCA (giant cell arteritis) ([place]) 07/16/2019   Temporal arteritis ([place]) 07/13/2019   Postoperative wound infection 06/23/2019   Ischemic leg 05/31/2019   Atherosclerosis of native arteries of extremity with intermittent claudication ([place]) 05/30/2019   Depression 05/29/2019   Eczema of lower extremity 03/04/2018   Chronic venous insufficiency 03/02/2018   Personal history of colonic polyps    Family history of colonic polyps    Benign neoplasm of ascending colon    Mitral valve insufficiency 02/08/2017   Dyspnea on exertion 02/07/2017   Non-rheumatic mitral regurgitation 02/07/2017   Precordial pain 02/07/2017   CAD (coronary artery disease) 12/21/2016   Essential hypertension 12/21/2016   Compression fracture of lumbar spine, non-traumatic ([place]) 04/26/2016   Compression fracture of L3 lumbar vertebra 04/20/2016   PVD (peripheral vascular disease) ([place]) 02/24/2016   Family history of premature CAD 02/24/2016   Gastritis    Other specified diseases of esophagus    Hiatal hernia    Gastritis and  gastroduodenitis    Ischemia of lower extremity    Arterial occlusion ([place])    Atherosclerosis of aorta ([place])    History of smoking    Hyperlipidemia    Pain in the chest    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 02/05/2016   Carotid artery stenosis 01/04/2016   Vertigo, benign paroxysmal 12/28/2015   Clinical depression 09/06/2015   History of alcoholism ([place]) 09/06/2015   Angiopathy, 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[place]) 09/06/2015   Atherosclerosis of native arteries of extremity with rest pain ([place]) 09/06/2015   Acute non-recurrent maxillary sinusitis 07/14/2015   Vaginal pruritus 05/26/2015   Chronic recurrent major depressive disorder ([place]) 03/09/2015   Osteoporosis, post-menopausal 03/09/2015   Peripheral blood vessel disorder ([place]) 03/09/2015   Acid reflux 03/09/2015   GERD (gastroesophageal reflux disease) 12/21/2014   Carotid artery narrowing 01/28/2014   Peripheral arterial occlusive disease ([place]) 06/08/2011   Occlusion and stenosis of unspecified carotid artery 06/08/2011   PAD (peripheral artery disease) ([place]) 06/08/2011    PCP: Danelle Berry, NP  REFERRING PROVIDER: Barrie Lyme MD  REFERRING DIAGNOSIS: S29.012A (ICD-10-CM) - Strain of muscle and tendon of back wall of thorax, initial encounter  THERAPY DIAG: Other low back pain  Muscle weakness (generalized)  RATIONALE FOR EVALUATION AND TREATMENT: Rehabilitation  ONSET DATE: 08/11/20 (approximate)  FROM INITIAL EVALUATION  SUBJECTIVE:                                                                                                                                                                                         Chief Complaint: R thoracic/rib pain  Pertinent History Pt reports she she started experiencing R shoulder pain which started shortly after her RLE amputation (08/11/20). She attributes the pain to having to self-propel a wheelchair for an extended period of time. She has seen Dr.  Mack Guise at [facility] in the past and underwent a R shoulder mini-open rotator cuff repair, DCE, and SAD on 02/27/20. Since the pain started she has undergone R shoulder injections without improvement in her pain. When pt describes her pain she points to the area below her R shoulder blade around T10/T11. She denies weakness in the RUE or any pain in the area of the [facility] joint or shoulder pain. Denies N/T in back or RUE. Pain worsens with extended use of RUE especially when cleaning the counters in her apartment or sweeping the floor. She has a history of L1 and L3 compression fractures in 2017. She currently sees pain management for her R shoulder pain and takes oxycodone to manage the pain. She also struggles with chronic RLE phantom pain s/p amputation and takes gabapentin for this issue. She has a history of multiple vascular surgeries prior to her amputation and also has a history of carpal tunnel syndrome.  Pain:  Pain Intensity: Not rated today Pain location: R back/flank inferior to the shoulder blade Pain Quality: sharp  Radiating: No  Numbness/Tingling: No Focal Weakness: No Aggravating factors: Excessive use of RUE, mopping the floors, wiping countertops, no pain with driving, Relieving factors: heating pad, never tried lidocaine patch, oxycodone, gabapentin doesn't help with R flank/back pain; 24-hour pain behavior: varies depending on activity History of prior shoulder or neck/shoulder injury, pain, surgery, or therapy: Yes Falls: Has patient fallen in last 6 months? 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  Recent memory is intact.  Remote memory is intact.  Attention span and concentration are intact.  Expressive speech is intact.  Patient's fund of knowledge is within normal limits for educational level.    Gross Musculoskeletal Assessment Tremor: None Bulk: Normal Tone: Normal  Gait Pt ambulates with RLE prosthesis and lofstrand in LUE, decreased self-selected gait speed;  Posture Increased thoracic kyphosis, forward head, and rounded shoulder in sitting and standing;  Cervical Screen AROM: WFL and painless with overpressure in all planes Spurlings A (ipsilateral lateral flexion/axial compression): R: Negative L: Negative Spurlings B (ipsilateral lateral flexion/contralateral rotation/axial compression): R: Negative L: Negative Repeated movement: Not examined Hoffman Sign (cervical cord compression): R: Not examined L: Not examined ULTT Median: R: Not examined L: Not examined ULTT Ulnar: R: Not examined L: Not examined ULTT Radial: R: Not examined L: Not examined   AROM Cervical AROM is painless in all directions and WFL although not full. No reproduction of pain with AROM R shoulder flexion and abduction which is also limited but WFL. No gross deficits noted. Pt denies pain with thoracic flexion, extension, rotation, and lateral flexion.    LE MMT:  MMT (out of 5) Right 04/04/2022 Left 04/04/2022  Cervical (isometric)  Flexion WNL  Extension WNL  Lateral Flexion WNL WNL  Rotation WNL WNL      Shoulder   Flexion 4+ 4+  Extension    Abduction 4+ 4+  External rotation (seated) 4+ 4+   Internal rotation (seated) 4+ 4+  Horizontal abduction    Horizontal adduction    Lower Trapezius    Rhomboids        Elbow  Flexion 5 5  Extension 5 5  Pronation    Supination        Wrist  Flexion 5 5  Extension 5 5  Radial deviation    Ulnar deviation        MCP  Flexion    Extension    Abduction 5 5  Adduction 5 5  (* = pain; Blank rows = not tested)  Sensation Grossly intact to light touch bilateral UE as determined by testing dermatomes C2-T2. Proprioception and hot/cold testing deferred on this date.  Reflexes Deferred   Palpation Pt is nontender to palpation around entire R shoulder girdle anterior, lateral, and posterior. No pain with palpation to R cervical paraspinals, upper trap, rhomboids, or mid trap. She has pain to palpation along spinous processes from T9-L3 as well as along paraspinals in this region on the right.   Repeated Movements Deferred  Passive Accessory Intervertebral Motion Pt reports reproduction of pain with CPA and bilateral UPA T9-L3. Unable to fully assess mobility secondary to pain however appear grossly hypomobile with notable thoracic kyphosis when prone.   SPECIAL TESTS Rotator Cuff  Drop Arm Test: Negative Painful Arc (Pain from 60 to 120 degrees scaption): Negative Infraspinatus Muscle Test: Negative   Subacromial Impingement Hawkins-Kennedy: Negative Neer (Block scapula, PROM flexion): Negative Painful Arc (Pain from 60 to 120 degrees scaption): Negative Empty Can: Negative External Rotation Resistance: Negative Horizontal Adduction: Not examined Scapular Assist: Not examined   Labral Tear Biceps Load II (120 elevation, full ER, 90 elbow flexion, full supination, resisted elbow flexion): Not examined Crank (160 scaption, axial load with IR/ER): Not examined Active Compression Test: Not examined  Bicep  Tendon Pathology Speed (shoulder flexion to 90, external rotation, full elbow extension, and forearm  supination with resistance: Not examined Yergason's (resisted shoulder ER and supination/biceps tendon pathology): Not examined  Shoulder Instability Sulcus Sign: Not examined Anterior Apprehension: Not examined  Beighton scale: Deferred   Outcome Measures Quick DASH: Deferred FOTO: Pt completed shoulder FOTO but this appears to be more related to back pain. Will update at next therapy session.    TODAY'S TREATMENT (04/03/22)   SUBJECTIVE: Pt denies any back/"R shoulder" pain at rest but states that it was 10/10 this morning when she was using her RUE. She took two oxycodone and it helped. Her low back continues "feeling great." No change in phantom pain. No specific questions currently.   PAIN: Denies   Ther-ex: Supine R shoulder flexion from neutral to 90 with 2# dumbbell (DB) x 10, 3# DB x 10; Supine R shoulder serratus punch at 90 flexion with 2# DB x 10, 3# DB x 10; Supine R shoulder circles at 90 flexion with 2# DB x 10 each direction, 3# DB x 10 each direction; L sidelying R shoulder ER with 3# DB 2 x 10; L sidleying R shoulder abduction with 3# DB 2 x 10; Prone R shoulder extension with 3# dumbbell (DB) 2 x 10; Prone R low row with 3# DB 2 x 10; Prone R horizontal abduction with 3# DB 2 x 10; Prone R low trap flexion (Y) with 3# DB 2 x 10;   Manual Therapy  Moist heat pack applied to R thoracic spine/flank with pt in prone at start of session x 5 minutes (unbilled); Extensive STM to R thoracic/upper lumbar erector spinae group and latissimus with effeurage, shingling, and use of Theraband Roller x 10 minutes;   Not performed: Supine SLR with manual resistance on R side 2 x 10 BLE; Supine bolster bridges 2 x 10; Sidelying hip abduction with manual resistance 2 x 10 BLE; Prone R shoulder ER with 3# DB 2 x 10; Prone alternating hip extension 2 x 15 BLE (attempted to add opposite arm lifts but pain increases so discontinued); Seated pallof press with green tband 2 x  10 toward each side; Seated rows with green tband 2 x 10; Seated dynamic hug with blue tband 2 x 10; Seated shoulder flexion with 3# dumbbell 2 x 10; Seated shoulder scaption with 3# dumbbell 2 x 10; Seated scapular retractions 2 x 10;    PATIENT EDUCATION:  Education details: Pt educated throughout session about proper posture and technique with exercises. Improved exercise technique, movement at target joints, use of target muscles after min to mod verbal, visual, tactile cues.  Person educated: Patient Education method: Explanation, verbal cues, tactile cues, and demonstration Education comprehension: verbalized understanding and returned demonstration;   HOME EXERCISE PROGRAM: None currently   ASSESSMENT:  CLINICAL IMPRESSION: Pt reporting no pain upon arrival today but was having pain earlier this AM. Continued with STM to improve muscle extensibility. Pt remains pain-free to palpation today and no pain reported during exercises. Right upper quarter strengthening repeated again during session and pt is able to progress resistance. Pt encouraged to follow-up as scheduled. She will benefit from PT services to address deficits in strength and pain in order to return to full function at home with less pain.    REHAB POTENTIAL: Fair    CLINICAL DECISION MAKING: Unstable/unpredictable  EVALUATION COMPLEXITY: High   GOALS: Goals reviewed with patient? Yes  SHORT TERM GOALS: Target date:  03/14/2022  Pt  will be independent with HEP to improve strength and decrease back pain to improve pain-free function at home. Baseline:  Goal status: INITIAL   LONG TERM GOALS: Target date: 04/11/2022  Pt will increase FOTO to at least 57 in order to demonstrate significant improvement in function at home and work related to back pain  Baseline: 02/14/22: To be completed at next visit; 02/22/22: 45; 03/29/22: Deferred Goal status: INITIAL  2.  Pt will decrease worst back pain by at least 2  points on the NPRS in order to demonstrate clinically significant reduction in back pain. Baseline: 02/14/22: To be completed at next visit; 03/29/22: 10/10; Goal status: INITIAL  3.  Pt will report at least 50% improvement in her back symptoms in order to demonstrate clinically significant reduction in disability related to back pain;        Baseline: 03/29/22: 80% improvement since starting with therapy; Goal status: INITIAL   PLAN: PT FREQUENCY: 1-2x/week  PT DURATION: 8 weeks  PLANNED INTERVENTIONS: Therapeutic exercises, Therapeutic activity, Neuromuscular re-education, Balance training, Gait training, Patient/Family education, Joint manipulation, Joint mobilization, Vestibular training, Canalith repositioning, Aquatic Therapy, Dry Needling, Electrical stimulation, Spinal manipulation, Spinal mobilization, Cryotherapy, Moist heat, Traction, Ultrasound, Ionotophoresis '4mg'$ /ml Dexamethasone, and Manual therapy  PLAN FOR NEXT SESSION: Continue manual techniques for R sided back pain, continue stretching/strengthening;    Phillips Grout PT, DPT, GCS  Physical Therapist- [facility]  04/04/2022, 9:17 AM

## 2022-04-03 ENCOUNTER — Ambulatory Visit: Payer: Medicare Other

## 2022-04-03 DIAGNOSIS — M5459 Other low back pain: Secondary | ICD-10-CM

## 2022-04-03 DIAGNOSIS — M6281 Muscle weakness (generalized): Secondary | ICD-10-CM

## 2022-04-03 DIAGNOSIS — G8929 Other chronic pain: Secondary | ICD-10-CM | POA: Diagnosis not present

## 2022-04-03 DIAGNOSIS — M25511 Pain in right shoulder: Secondary | ICD-10-CM | POA: Diagnosis not present

## 2022-04-05 ENCOUNTER — Ambulatory Visit: Payer: Medicare Other

## 2022-04-05 NOTE — Therapy (Signed)
OUTPATIENT PHYSICAL THERAPY SHOULDER/BACK TREATMENT   Patient Name: Sara [name] MRN: [id number] DOB:1951/05/23, 71 y.o., female Today's Date: 04/07/2022   PT End of Session - 04/07/22 0953     Visit Number 12    Number of Visits 25    Date for PT Re-Evaluation 05/09/22    Authorization Type eval: 02/14/22    PT Start Time 1015    PT Stop Time 1100    PT Time Calculation (min) 45 min    Activity Tolerance Patient tolerated treatment well    Behavior During Therapy [facility] for tasks assessed/performed             Past Medical History:  Diagnosis Date   Arthritis    right shoulder   Depression    Dyspnea    GERD (gastroesophageal reflux disease)    History of blood clots    Hyperlipidemia    Hypertension    Mild mitral regurgitation    Osteoporosis    Peripheral vascular disease ([place])    Stomach ulcer    Vascular disease    Sees Dr. Delana Meyer   Vertigo    Last episode approx Aug 2015   Wears dentures    full upper   Past Surgical History:  Procedure Laterality Date   ADENOIDECTOMY     AMPUTATION Right 08/11/2020   Procedure: AMPUTATION ABOVE KNEE;  Surgeon: [name], MD;  Location: [facility];  Service: Vascular;  Laterality: Right;   ARTERY BIOPSY Right 07/14/2019   Procedure: BIOPSY TEMPORAL ARTERY;  Surgeon: [name], MD;  Location: [facility];  Service: Vascular;  Laterality: Right;   BREAST CYST ASPIRATION Left    CARDIAC CATHETERIZATION  02/15/2017   [facility]   COLONOSCOPY WITH PROPOFOL N/A 10/22/2017   Procedure: COLONOSCOPY WITH PROPOFOL;  Surgeon: Lucilla Lame, MD;  Location: [place];  Service: Endoscopy;  Laterality: N/A;  specimens not taken--pt on Plavix will be brought back in after 7 days off med   COLONOSCOPY WITH PROPOFOL N/A 11/05/2017   Procedure: COLONOSCOPY WITH PROPOFOL;  Surgeon: Lucilla Lame, MD;  Location: [place];  Service: Endoscopy;  Laterality: N/A;   ESOPHAGOGASTRODUODENOSCOPY N/A 12/21/2014    Procedure: ESOPHAGOGASTRODUODENOSCOPY (EGD);  Surgeon: Lucilla Lame, MD;  Location: [place];  Service: Gastroenterology;  Laterality: N/A;   ESOPHAGOGASTRODUODENOSCOPY (EGD) WITH PROPOFOL N/A 02/08/2016   Procedure: ESOPHAGOGASTRODUODENOSCOPY (EGD) WITH PROPOFOL;  Surgeon: Lucilla Lame, MD;  Location: [facility];  Service: Endoscopy;  Laterality: N/A;   FASCIOTOMY Right 05/30/2019   Procedure: FASCIOTOMY;  Surgeon: [name], MD;  Location: [facility];  Service: Vascular;  Laterality: Right;   FASCIOTOMY CLOSURE Right 06/04/2019   Procedure: FASCIOTOMY CLOSURE;  Surgeon: [name], MD;  Location: [facility];  Service: Vascular;  Laterality: Right;   HEMORROIDECTOMY  2014   LOWER EXTREMITY ANGIOGRAPHY Right 05/30/2019   Procedure: LOWER EXTREMITY ANGIOGRAPHY;  Surgeon: [name], MD;  Location: [place] [facility];  Service: Cardiovascular;  Laterality: Right;   LOWER EXTREMITY ANGIOGRAPHY Right 07/02/2020   Procedure: LOWER EXTREMITY ANGIOGRAPHY;  Surgeon: [name], MD;  Location: [place] [facility];  Service: Cardiovascular;  Laterality: Right;   PERIPHERAL VASCULAR CATHETERIZATION N/A 02/09/2016   Procedure: Abdominal Aortogram w/Lower Extremity;  Surgeon: [name], MD;  Location: [place] [facility];  Service: Cardiovascular;  Laterality: N/A;   PERIPHERAL VASCULAR CATHETERIZATION Right 02/10/2016   Procedure: Lower Extremity Angiography;  Surgeon: [name], MD;  Location: [facility]  [facility];  Service: Cardiovascular;  Laterality: Right;   POLYPECTOMY  11/05/2017   Procedure: POLYPECTOMY;  Surgeon: Lucilla Lame, MD;  Location: [place];  Service: Endoscopy;;   SHOULDER ARTHROSCOPY WITH ROTATOR CUFF REPAIR AND SUBACROMIAL DECOMPRESSION Right 02/27/2020   Procedure: RIGHT SHOULDER ARTHROSCOPY SUBACROMIAL DECOMPRESSION, DISTAL CLAVICLE EXCISION AND MINI-OPEN ROTATOR CUFF REPAIR;  Surgeon: Thornton Park, MD;  Location:  [facility];  Service: Orthopedics;  Laterality: Right;   TONSILLECTOMY     VASCULAR SURGERY  [id number]   Fem-Pop Bypass   Patient Active Problem List   Diagnosis Date Noted   Degeneration of lumbar intervertebral disc 01/04/2022   Shoulder pain 01/04/2022   Pain in joint of right shoulder 01/04/2022   Spasm of back muscles 01/04/2022   Thoracic back pain 01/04/2022   History of left above knee amputation [facility]) (Dec 2021) 12/27/2021   Rotator cuff arthropathy of right shoulder 12/27/2021   Hx of rotator cuff surgery 12/27/2021   Chronic pain syndrome 12/27/2021   Adhesive capsulitis of right shoulder 12/05/2021   Phantom pain ([place]) 11/10/2021   Muscle spasm 09/09/2020   S/P AKA (above knee amputation), right ([place]) 09/08/2020   Ischemia of extremity 07/02/2020   Chronic pain in right shoulder 11/25/2019   Encounter for long-term (current) use of high-risk medication 07/16/2019   GCA (giant cell arteritis) ([place]) 07/16/2019   Temporal arteritis ([place]) 07/13/2019   Postoperative wound infection 06/23/2019   Ischemic leg 05/31/2019   Atherosclerosis of native arteries of extremity with intermittent claudication ([place]) 05/30/2019   Depression 05/29/2019   Eczema of lower extremity 03/04/2018   Chronic venous insufficiency 03/02/2018   Personal history of colonic polyps    Family history of colonic polyps    Benign neoplasm of ascending colon    Mitral valve insufficiency 02/08/2017   Dyspnea on exertion 02/07/2017   Non-rheumatic mitral regurgitation 02/07/2017   Precordial pain 02/07/2017   CAD (coronary artery disease) 12/21/2016   Essential hypertension 12/21/2016   Compression fracture of lumbar spine, non-traumatic ([place]) 04/26/2016   Compression fracture of L3 lumbar vertebra 04/20/2016   PVD (peripheral vascular disease) ([place]) 02/24/2016   Family history of premature CAD 02/24/2016   Gastritis    Other specified diseases of esophagus    Hiatal hernia    Gastritis and  gastroduodenitis    Ischemia of lower extremity    Arterial occlusion ([place])    Atherosclerosis of aorta ([place])    History of smoking    Hyperlipidemia    Pain in the chest    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 02/05/2016   Carotid artery stenosis 01/04/2016   Vertigo, benign paroxysmal 12/28/2015   Clinical depression 09/06/2015   History of alcoholism ([place]) 09/06/2015   Angiopathy, 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[place]) 09/06/2015   Atherosclerosis of native arteries of extremity with rest pain ([place]) 09/06/2015   Acute non-recurrent maxillary sinusitis 07/14/2015   Vaginal pruritus 05/26/2015   Chronic recurrent major depressive disorder ([place]) 03/09/2015   Osteoporosis, post-menopausal 03/09/2015   Peripheral blood vessel disorder ([place]) 03/09/2015   Acid reflux 03/09/2015   GERD (gastroesophageal reflux disease) 12/21/2014   Carotid artery narrowing 01/28/2014   Peripheral arterial occlusive disease ([place]) 06/08/2011   Occlusion and stenosis of unspecified carotid artery 06/08/2011   PAD (peripheral artery disease) ([place]) 06/08/2011    PCP: Danelle Berry, NP  REFERRING PROVIDER: Barrie Lyme MD  REFERRING DIAGNOSIS: S29.012A (ICD-10-CM) - Strain of muscle and tendon of back wall of thorax, initial encounter  THERAPY DIAG: Other low back pain  Muscle weakness (generalized)  RATIONALE FOR EVALUATION AND TREATMENT: Rehabilitation  ONSET DATE: 08/11/20 (approximate)  FROM INITIAL EVALUATION  SUBJECTIVE:                                                                                                                                                                                         Chief Complaint: R thoracic/rib pain  Pertinent History Pt reports she she started experiencing R shoulder pain which started shortly after her RLE amputation (08/11/20). She attributes the pain to having to self-propel a wheelchair for an extended period of time. She has seen Dr.  Mack Guise at [facility] in the past and underwent a R shoulder mini-open rotator cuff repair, DCE, and SAD on 02/27/20. Since the pain started she has undergone R shoulder injections without improvement in her pain. When pt describes her pain she points to the area below her R shoulder blade around T10/T11. She denies weakness in the RUE or any pain in the area of the [facility] joint or shoulder pain. Denies N/T in back or RUE. Pain worsens with extended use of RUE especially when cleaning the counters in her apartment or sweeping the floor. She has a history of L1 and L3 compression fractures in 2017. She currently sees pain management for her R shoulder pain and takes oxycodone to manage the pain. She also struggles with chronic RLE phantom pain s/p amputation and takes gabapentin for this issue. She has a history of multiple vascular surgeries prior to her amputation and also has a history of carpal tunnel syndrome.  Pain:  Pain Intensity: Not rated today Pain location: R back/flank inferior to the shoulder blade Pain Quality: sharp  Radiating: No  Numbness/Tingling: No Focal Weakness: No Aggravating factors: Excessive use of RUE, mopping the floors, wiping countertops, no pain with driving, Relieving factors: heating pad, never tried lidocaine patch, oxycodone, gabapentin doesn't help with R flank/back pain; 24-hour pain behavior: varies depending on activity History of prior shoulder or neck/shoulder injury, pain, surgery, or therapy: Yes Falls: Has patient fallen in last 6 months? 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  Recent memory is intact.  Remote memory is intact.  Attention span and concentration are intact.  Expressive speech is intact.  Patient's fund of knowledge is within normal limits for educational level.    Gross Musculoskeletal Assessment Tremor: None Bulk: Normal Tone: Normal  Gait Pt ambulates with RLE prosthesis and lofstrand in LUE, decreased self-selected gait speed;  Posture Increased thoracic kyphosis, forward head, and rounded shoulder in sitting and standing;  Cervical Screen AROM: WFL and painless with overpressure in all planes Spurlings A (ipsilateral lateral flexion/axial compression): R: Negative L: Negative Spurlings B (ipsilateral lateral flexion/contralateral rotation/axial compression): R: Negative L: Negative Repeated movement: Not examined Hoffman Sign (cervical cord compression): R: Not examined L: Not examined ULTT Median: R: Not examined L: Not examined ULTT Ulnar: R: Not examined L: Not examined ULTT Radial: R: Not examined L: Not examined   AROM Cervical AROM is painless in all directions and WFL although not full. No reproduction of pain with AROM R shoulder flexion and abduction which is also limited but WFL. No gross deficits noted. Pt denies pain with thoracic flexion, extension, rotation, and lateral flexion.    LE MMT:  MMT (out of 5) Right 04/07/2022 Left 04/07/2022  Cervical (isometric)  Flexion WNL  Extension WNL  Lateral Flexion WNL WNL  Rotation WNL WNL      Shoulder   Flexion 4+ 4+  Extension    Abduction 4+ 4+  External rotation (seated) 4+ 4+   Internal rotation (seated) 4+ 4+  Horizontal abduction    Horizontal adduction    Lower Trapezius    Rhomboids        Elbow  Flexion 5 5  Extension 5 5  Pronation    Supination        Wrist  Flexion 5 5  Extension 5 5  Radial deviation    Ulnar deviation        MCP  Flexion    Extension    Abduction 5 5  Adduction 5 5  (* = pain; Blank rows = not tested)  Sensation Grossly intact to light touch bilateral UE as determined by testing dermatomes C2-T2. Proprioception and hot/cold testing deferred on this date.  Reflexes Deferred   Palpation Pt is nontender to palpation around entire R shoulder girdle anterior, lateral, and posterior. No pain with palpation to R cervical paraspinals, upper trap, rhomboids, or mid trap. She has pain to palpation along spinous processes from T9-L3 as well as along paraspinals in this region on the right.   Repeated Movements Deferred  Passive Accessory Intervertebral Motion Pt reports reproduction of pain with CPA and bilateral UPA T9-L3. Unable to fully assess mobility secondary to pain however appear grossly hypomobile with notable thoracic kyphosis when prone.   SPECIAL TESTS Rotator Cuff  Drop Arm Test: Negative Painful Arc (Pain from 60 to 120 degrees scaption): Negative Infraspinatus Muscle Test: Negative   Subacromial Impingement Hawkins-Kennedy: Negative Neer (Block scapula, PROM flexion): Negative Painful Arc (Pain from 60 to 120 degrees scaption): Negative Empty Can: Negative External Rotation Resistance: Negative Horizontal Adduction: Not examined Scapular Assist: Not examined   Labral Tear Biceps Load II (120 elevation, full ER, 90 elbow flexion, full supination, resisted elbow flexion): Not examined Crank (160 scaption, axial load with IR/ER): Not examined Active Compression Test: Not examined  Bicep  Tendon Pathology Speed (shoulder flexion to 90, external rotation, full elbow extension, and forearm  supination with resistance: Not examined Yergason's (resisted shoulder ER and supination/biceps tendon pathology): Not examined  Shoulder Instability Sulcus Sign: Not examined Anterior Apprehension: Not examined  Beighton scale: Deferred   Outcome Measures Quick DASH: Deferred FOTO: Pt completed shoulder FOTO but this appears to be more related to back pain. Will update at next therapy session.    TODAY'S TREATMENT (04/07/22)   SUBJECTIVE: Pt denies any back/"R shoulder" pain at rest but states she had to take two oxycodone this morning due to pain. Her low back continues to be pain-free. No change in phantom pain. No specific questions currently.   PAIN: Denies   Ther-ex: Seated shoulder flexion with 3# dumbbells 2 x 10; Seated shoulder scaption with 3# dumbbells 2 x 10; Seated overhead shoulder press with 2# dumbbells 2 x 10; Seated pallof press with green tband x 10 toward each side; Seated rows with green tband 2 x 10; Seated scapular retractions 2 x 10;   Manual Therapy  Moist heat pack applied to R thoracic spine/flank with pt in prone at start of session x 5 minutes (unbilled); Extensive STM to R thoracic/upper lumbar erector spinae group and latissimus with effeurage, shingling, and use of Theraband Roller x 10 minutes;   Not performed: Supine SLR with manual resistance on R side 2 x 10 BLE; Supine bolster bridges 2 x 10; Supine R shoulder flexion from neutral to 90 with 2# dumbbell (DB) x 10, 3# DB x 10; Supine R shoulder serratus punch at 90 flexion with 2# DB x 10, 3# DB x 10; Supine R shoulder circles at 90 flexion with 2# DB x 10 each direction, 3# DB x 10 each direction; L sidelying R shoulder ER with 3# DB 2 x 10; L sidleying R shoulder abduction with 3# DB 2 x 10; Sidelying hip abduction with manual resistance 2 x 10 BLE; Prone R shoulder ER with 3# DB 2 x 10; Prone alternating hip extension 2 x 15 BLE (attempted to add opposite arm lifts but pain  increases so discontinued); Prone R shoulder extension with 3# dumbbell (DB) 2 x 10; Prone R low row with 3# DB 2 x 10; Prone R horizontal abduction with 3# DB 2 x 10; Prone R low trap flexion (Y) with 3# DB 2 x 10; Seated dynamic hug with blue tband 2 x 10;      PATIENT EDUCATION:  Education details: Pt educated throughout session about proper posture and technique with exercises. Improved exercise technique, movement at target joints, use of target muscles after min to mod verbal, visual, tactile cues.  Person educated: Patient Education method: Explanation, verbal cues, tactile cues, and demonstration Education comprehension: verbalized understanding and returned demonstration;   HOME EXERCISE PROGRAM: None currently   ASSESSMENT:  CLINICAL IMPRESSION: Pt experiencing more pain/discomfort during therapy session today compared to prior appointments. Continued with STM to improve muscle extensibility. Right upper quarter strengthening repeated again during session however rest breaks required due to pain. Pt has a follow-up appointment with Dr. Mack Guise next week. Pt encouraged to follow-up as scheduled. She will benefit from PT services to address deficits in strength and pain in order to return to full function at home with less pain.    REHAB POTENTIAL: Fair    CLINICAL DECISION MAKING: Unstable/unpredictable  EVALUATION COMPLEXITY: High   GOALS: Goals reviewed with patient? Yes  SHORT TERM GOALS: Target date:  03/14/2022  Pt  will be independent with HEP to improve strength and decrease back pain to improve pain-free function at home. Baseline:  Goal status: INITIAL   LONG TERM GOALS: Target date: 04/11/2022  Pt will increase FOTO to at least 57 in order to demonstrate significant improvement in function at home and work related to back pain  Baseline: 02/14/22: To be completed at next visit; 02/22/22: 45; 03/29/22: Deferred Goal status: INITIAL  2.  Pt will decrease  worst back pain by at least 2 points on the NPRS in order to demonstrate clinically significant reduction in back pain. Baseline: 02/14/22: To be completed at next visit; 03/29/22: 10/10; Goal status: INITIAL  3.  Pt will report at least 50% improvement in her back symptoms in order to demonstrate clinically significant reduction in disability related to back pain;        Baseline: 03/29/22: 80% improvement since starting with therapy; Goal status: INITIAL   PLAN: PT FREQUENCY: 1-2x/week  PT DURATION: 8 weeks  PLANNED INTERVENTIONS: Therapeutic exercises, Therapeutic activity, Neuromuscular re-education, Balance training, Gait training, Patient/Family education, Joint manipulation, Joint mobilization, Vestibular training, Canalith repositioning, Aquatic Therapy, Dry Needling, Electrical stimulation, Spinal manipulation, Spinal mobilization, Cryotherapy, Moist heat, Traction, Ultrasound, Ionotophoresis '4mg'$ /ml Dexamethasone, and Manual therapy  PLAN FOR NEXT SESSION: Continue manual techniques for R sided back pain, continue stretching/strengthening;    Phillips Grout PT, DPT, GCS  Physical Therapist- [facility]  04/07/2022, 11:42 AM

## 2022-04-07 ENCOUNTER — Ambulatory Visit: Payer: Medicare Other

## 2022-04-07 DIAGNOSIS — M25511 Pain in right shoulder: Secondary | ICD-10-CM | POA: Diagnosis not present

## 2022-04-07 DIAGNOSIS — M5459 Other low back pain: Secondary | ICD-10-CM

## 2022-04-07 DIAGNOSIS — M6281 Muscle weakness (generalized): Secondary | ICD-10-CM

## 2022-04-07 DIAGNOSIS — G8929 Other chronic pain: Secondary | ICD-10-CM | POA: Diagnosis not present

## 2022-04-07 NOTE — Therapy (Signed)
OUTPATIENT PHYSICAL THERAPY SHOULDER/BACK TREATMENT   Patient Name: Sara [name] MRN: [id number] DOB:[birth year], 71 y.o., female Today's Date: 04/10/2022   PT End of Session - 04/10/22 1146     Visit Number 13    Number of Visits 25    Date for PT Re-Evaluation 05/09/22    Authorization Type eval: 02/14/22    PT Start Time 1147    PT Stop Time 1230    PT Time Calculation (min) 43 min    Activity Tolerance Patient tolerated treatment well    Behavior During Therapy [facility] for tasks assessed/performed              Past Medical History:  Diagnosis Date   Arthritis    right shoulder   Depression    Dyspnea    GERD (gastroesophageal reflux disease)    History of blood clots    Hyperlipidemia    Hypertension    Mild mitral regurgitation    Osteoporosis    Peripheral vascular disease ([place])    Stomach ulcer    Vascular disease    Sees Dr. Delana Meyer   Vertigo    Last episode approx Aug 2015   Wears dentures    full upper   Past Surgical History:  Procedure Laterality Date   ADENOIDECTOMY     AMPUTATION Right 08/11/2020   Procedure: AMPUTATION ABOVE KNEE;  Surgeon: [name], MD;  Location: [facility];  Service: Vascular;  Laterality: Right;   ARTERY BIOPSY Right 07/14/2019   Procedure: BIOPSY TEMPORAL ARTERY;  Surgeon: [name], MD;  Location: [facility];  Service: Vascular;  Laterality: Right;   BREAST CYST ASPIRATION Left    CARDIAC CATHETERIZATION  02/15/2017   [facility]   COLONOSCOPY WITH PROPOFOL N/A 10/22/2017   Procedure: COLONOSCOPY WITH PROPOFOL;  Surgeon: Lucilla Lame, MD;  Location: [place];  Service: Endoscopy;  Laterality: N/A;  specimens not taken--pt on Plavix will be brought back in after 7 days off med   COLONOSCOPY WITH PROPOFOL N/A 11/05/2017   Procedure: COLONOSCOPY WITH PROPOFOL;  Surgeon: Lucilla Lame, MD;  Location: [place];  Service: Endoscopy;  Laterality: N/A;   ESOPHAGOGASTRODUODENOSCOPY N/A 12/21/2014    Procedure: ESOPHAGOGASTRODUODENOSCOPY (EGD);  Surgeon: Lucilla Lame, MD;  Location: [place];  Service: Gastroenterology;  Laterality: N/A;   ESOPHAGOGASTRODUODENOSCOPY (EGD) WITH PROPOFOL N/A 02/08/2016   Procedure: ESOPHAGOGASTRODUODENOSCOPY (EGD) WITH PROPOFOL;  Surgeon: Lucilla Lame, MD;  Location: [facility];  Service: Endoscopy;  Laterality: N/A;   FASCIOTOMY Right 05/30/2019   Procedure: FASCIOTOMY;  Surgeon: [name], MD;  Location: [facility];  Service: Vascular;  Laterality: Right;   FASCIOTOMY CLOSURE Right 06/04/2019   Procedure: FASCIOTOMY CLOSURE;  Surgeon: [name], MD;  Location: [facility];  Service: Vascular;  Laterality: Right;   HEMORROIDECTOMY  2014   LOWER EXTREMITY ANGIOGRAPHY Right 05/30/2019   Procedure: LOWER EXTREMITY ANGIOGRAPHY;  Surgeon: [name], MD;  Location: [place] [facility];  Service: Cardiovascular;  Laterality: Right;   LOWER EXTREMITY ANGIOGRAPHY Right 07/02/2020   Procedure: LOWER EXTREMITY ANGIOGRAPHY;  Surgeon: [name], MD;  Location: [place] [facility];  Service: Cardiovascular;  Laterality: Right;   PERIPHERAL VASCULAR CATHETERIZATION N/A 02/09/2016   Procedure: Abdominal Aortogram w/Lower Extremity;  Surgeon: [name], MD;  Location: [place] [facility];  Service: Cardiovascular;  Laterality: N/A;   PERIPHERAL VASCULAR CATHETERIZATION Right 02/10/2016   Procedure: Lower Extremity Angiography;  Surgeon: [name], MD;  Location: [place]  [facility];  Service: Cardiovascular;  Laterality: Right;   POLYPECTOMY  11/05/2017   Procedure: POLYPECTOMY;  Surgeon: Lucilla Lame, MD;  Location: [place];  Service: Endoscopy;;   SHOULDER ARTHROSCOPY WITH ROTATOR CUFF REPAIR AND SUBACROMIAL DECOMPRESSION Right 02/27/2020   Procedure: RIGHT SHOULDER ARTHROSCOPY SUBACROMIAL DECOMPRESSION, DISTAL CLAVICLE EXCISION AND MINI-OPEN ROTATOR CUFF REPAIR;  Surgeon: Thornton Park, MD;  Location:  [facility];  Service: Orthopedics;  Laterality: Right;   TONSILLECTOMY     VASCULAR SURGERY  [phone]   Fem-Pop Bypass   Patient Active Problem List   Diagnosis Date Noted   Degeneration of lumbar intervertebral disc 01/04/2022   Shoulder pain 01/04/2022   Pain in joint of right shoulder 01/04/2022   Spasm of back muscles 01/04/2022   Thoracic back pain 01/04/2022   History of left above knee amputation [facility]) (Dec 2021) 12/27/2021   Rotator cuff arthropathy of right shoulder 12/27/2021   Hx of rotator cuff surgery 12/27/2021   Chronic pain syndrome 12/27/2021   Adhesive capsulitis of right shoulder 12/05/2021   Phantom pain ([place]) 11/10/2021   Muscle spasm 09/09/2020   S/P AKA (above knee amputation), right ([place]) 09/08/2020   Ischemia of extremity 07/02/2020   Chronic pain in right shoulder 11/25/2019   Encounter for long-term (current) use of high-risk medication 07/16/2019   GCA (giant cell arteritis) ([place]) 07/16/2019   Temporal arteritis ([place]) 07/13/2019   Postoperative wound infection 06/23/2019   Ischemic leg 05/31/2019   Atherosclerosis of native arteries of extremity with intermittent claudication ([place]) 05/30/2019   Depression 05/29/2019   Eczema of lower extremity 03/04/2018   Chronic venous insufficiency 03/02/2018   Personal history of colonic polyps    Family history of colonic polyps    Benign neoplasm of ascending colon    Mitral valve insufficiency 02/08/2017   Dyspnea on exertion 02/07/2017   Non-rheumatic mitral regurgitation 02/07/2017   Precordial pain 02/07/2017   CAD (coronary artery disease) 12/21/2016   Essential hypertension 12/21/2016   Compression fracture of lumbar spine, non-traumatic ([place]) 04/26/2016   Compression fracture of L3 lumbar vertebra 04/20/2016   PVD (peripheral vascular disease) ([place]) 02/24/2016   Family history of premature CAD 02/24/2016   Gastritis    Other specified diseases of esophagus    Hiatal hernia    Gastritis and  gastroduodenitis    Ischemia of lower extremity    Arterial occlusion ([place])    Atherosclerosis of aorta ([place])    History of smoking    Hyperlipidemia    Pain in the chest    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 02/05/2016   Carotid artery stenosis 01/04/2016   Vertigo, benign paroxysmal 12/28/2015   Clinical depression 09/06/2015   History of alcoholism ([place]) 09/06/2015   Angiopathy, 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[place]) 09/06/2015   Atherosclerosis of native arteries of extremity with rest pain ([place]) 09/06/2015   Acute non-recurrent maxillary sinusitis 07/14/2015   Vaginal pruritus 05/26/2015   Chronic recurrent major depressive disorder ([place]) 03/09/2015   Osteoporosis, post-menopausal 03/09/2015   Peripheral blood vessel disorder ([place]) 03/09/2015   Acid reflux 03/09/2015   GERD (gastroesophageal reflux disease) 12/21/2014   Carotid artery narrowing 01/28/2014   Peripheral arterial occlusive disease ([place]) 06/08/2011   Occlusion and stenosis of unspecified carotid artery 06/08/2011   PAD (peripheral artery disease) ([place]) 06/08/2011    PCP: Danelle Berry, NP  REFERRING PROVIDER: Barrie Lyme MD  REFERRING DIAGNOSIS: S29.012A (ICD-10-CM) - Strain of muscle and tendon of back wall of thorax, initial encounter  THERAPY DIAG: Muscle weakness (generalized)  Other low back pain  Chronic right shoulder pain  RATIONALE FOR EVALUATION AND TREATMENT: Rehabilitation  ONSET DATE: 08/11/20 (approximate)  FROM INITIAL EVALUATION  SUBJECTIVE:                                                                                                                                                                                         Chief Complaint: R thoracic/rib pain  Pertinent History Pt reports she she started experiencing R shoulder pain which started shortly after her RLE amputation (08/11/20). She attributes the pain to having to self-propel a wheelchair for an extended  period of time. She has seen Dr. Mack Guise at [facility] in the past and underwent a R shoulder mini-open rotator cuff repair, DCE, and SAD on 02/27/20. Since the pain started she has undergone R shoulder injections without improvement in her pain. When pt describes her pain she points to the area below her R shoulder blade around T10/T11. She denies weakness in the RUE or any pain in the area of the [facility] joint or shoulder pain. Denies N/T in back or RUE. Pain worsens with extended use of RUE especially when cleaning the counters in her apartment or sweeping the floor. She has a history of L1 and L3 compression fractures in 2017. She currently sees pain management for her R shoulder pain and takes oxycodone to manage the pain. She also struggles with chronic RLE phantom pain s/p amputation and takes gabapentin for this issue. She has a history of multiple vascular surgeries prior to her amputation and also has a history of carpal tunnel syndrome.  Pain:  Pain Intensity: Not rated today Pain location: R back/flank inferior to the shoulder blade Pain Quality: sharp  Radiating: No  Numbness/Tingling: No Focal Weakness: No Aggravating factors: Excessive use of RUE, mopping the floors, wiping countertops, no pain with driving, Relieving factors: heating pad, never tried lidocaine patch, oxycodone, gabapentin doesn't help with R flank/back pain; 24-hour pain behavior: varies depending on activity History of prior shoulder or neck/shoulder injury, pain, surgery, or therapy: Yes Falls: Has patient fallen in last 6 months? 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  Recent memory is intact.  Remote memory is intact.  Attention span and concentration are intact.  Expressive speech is intact.  Patient's fund of knowledge is within normal limits for educational level.    Gross Musculoskeletal Assessment Tremor: None Bulk: Normal Tone: Normal  Gait Pt ambulates with RLE prosthesis and lofstrand in LUE, decreased self-selected gait speed;  Posture Increased thoracic kyphosis, forward head, and rounded shoulder in sitting and standing;  Cervical Screen AROM: WFL and painless with overpressure in all planes Spurlings A (ipsilateral lateral flexion/axial compression): R: Negative L: Negative Spurlings B (ipsilateral lateral flexion/contralateral rotation/axial compression): R: Negative L: Negative Repeated movement: Not examined Hoffman Sign (cervical cord compression): R: Not examined L: Not examined ULTT Median: R: Not examined L: Not examined ULTT Ulnar: R: Not examined L: Not examined ULTT Radial: R: Not examined L: Not examined   AROM Cervical AROM is painless in all directions and WFL although not full. No reproduction of pain with AROM R shoulder flexion and abduction which is also limited but WFL. No gross deficits noted. Pt denies pain with thoracic flexion, extension, rotation, and lateral flexion.    LE MMT:  MMT (out of 5) Right 04/10/2022 Left 04/10/2022  Cervical (isometric)  Flexion WNL  Extension WNL  Lateral Flexion WNL WNL  Rotation WNL WNL      Shoulder   Flexion 4+ 4+  Extension    Abduction 4+ 4+   External rotation (seated) 4+ 4+  Internal rotation (seated) 4+ 4+  Horizontal abduction    Horizontal adduction    Lower Trapezius    Rhomboids        Elbow  Flexion 5 5  Extension 5 5  Pronation    Supination        Wrist  Flexion 5 5  Extension 5 5  Radial deviation    Ulnar deviation        MCP  Flexion    Extension    Abduction 5 5  Adduction 5 5  (* = pain; Blank rows = not tested)  Sensation Grossly intact to light touch bilateral UE as determined by testing dermatomes C2-T2. Proprioception and hot/cold testing deferred on this date.  Reflexes Deferred   Palpation Pt is nontender to palpation around entire R shoulder girdle anterior, lateral, and posterior. No pain with palpation to R cervical paraspinals, upper trap, rhomboids, or mid trap. She has pain to palpation along spinous processes from T9-L3 as well as along paraspinals in this region on the right.   Repeated Movements Deferred  Passive Accessory Intervertebral Motion Pt reports reproduction of pain with CPA and bilateral UPA T9-L3. Unable to fully assess mobility secondary to pain however appear grossly hypomobile with notable thoracic kyphosis when prone.   SPECIAL TESTS Rotator Cuff  Drop Arm Test: Negative Painful Arc (Pain from 60 to 120 degrees scaption): Negative Infraspinatus Muscle Test: Negative   Subacromial Impingement Hawkins-Kennedy: Negative Neer (Block scapula, PROM flexion): Negative Painful Arc (Pain from 60 to 120 degrees scaption): Negative Empty Can: Negative External Rotation Resistance: Negative Horizontal Adduction: Not examined Scapular Assist: Not examined   Labral Tear Biceps Load II (120 elevation, full ER, 90 elbow flexion, full supination, resisted elbow flexion): Not examined Crank (160 scaption, axial load with IR/ER): Not examined Active Compression  Test: Not examined  Bicep Tendon Pathology Speed (shoulder flexion to 90, external rotation, full  elbow extension, and forearm supination with resistance: Not examined Yergason's (resisted shoulder ER and supination/biceps tendon pathology): Not examined  Shoulder Instability Sulcus Sign: Not examined Anterior Apprehension: Not examined  Beighton scale: Deferred   Outcome Measures Quick DASH: Deferred FOTO: Pt completed shoulder FOTO but this appears to be more related to back pain. Will update at next therapy session.    TODAY'S TREATMENT   SUBJECTIVE: Pt denies any back/"R shoulder" pain at rest and did not have to take any oxycodone this morning due to pain. Her low back continues to be pain-free. No specific questions currently. She has a follow-up appointment with Wednesday 04/12/22 to see Dr. Mack Guise and a remote visit with Dr. Holley Raring on 04/26/22.    PAIN: Denies   Ther-ex: Prone alternating hip extension x 15 BLE; Prone R shoulder extension with 3# dumbbell (DB) 2 x 10; Prone R low row with 3# DB 2 x 10; Prone R horizontal abduction with 3# DB 2 x 10; Prone R low trap flexion (Y) with 3# DB 2 x 10; Seated shoulder flexion with 3# dumbbells 2 x 10; Seated shoulder scaption with 3# dumbbells 2 x 10;   Manual Therapy  Moist heat pack applied to R thoracic spine/flank with pt in prone at start of session x 5 minutes (unbilled); Extensive STM to R thoracic/upper lumbar erector spinae group and latissimus with effeurage, shingling, and use of Theraband Roller x 10 minutes;   Not performed: Supine SLR with manual resistance on R side 2 x 10 BLE; Supine bolster bridges 2 x 10; Supine R shoulder flexion from neutral to 90 with 2# dumbbell (DB) x 10, 3# DB x 10; Supine R shoulder serratus punch at 90 flexion with 2# DB x 10, 3# DB x 10; Supine R shoulder circles at 90 flexion with 2# DB x 10 each direction, 3# DB x 10 each direction; L sidelying R shoulder ER with 3# DB 2 x 10; L sidleying R shoulder abduction with 3# DB 2 x 10; Prone R shoulder ER with 3# DB 2 x  10; Sidelying hip abduction with manual resistance 2 x 10 BLE; Seated dynamic hug with blue tband 2 x 10; Seated overhead shoulder press with 2# dumbbells 2 x 10; Seated pallof press with green tband x 10 toward each side; Seated rows with green tband 2 x 10; Seated scapular retractions 2 x 10;    PATIENT EDUCATION:  Education details: Pt educated throughout session about proper posture and technique with exercises. Improved exercise technique, movement at target joints, use of target muscles after min to mod verbal, visual, tactile cues.  Person educated: Patient Education method: Explanation, verbal cues, tactile cues, and demonstration Education comprehension: verbalized understanding and returned demonstration;   HOME EXERCISE PROGRAM: None currently   ASSESSMENT:  CLINICAL IMPRESSION: Continued with STM to improve muscle extensibility. Right upper quarter strengthening repeated again during session with intermittent R sided thoracic back pain. Pt has a follow-up appointment with Dr. Mack Guise next week but does not see Dr. Holley Raring until 04/26/22. While she has experienced considerable improvement in her pain since starting with therapy she still has notable pain. Pt encouraged to follow-up as scheduled. She will benefit from PT services to address deficits in strength and pain in order to return to full function at home with less pain.    REHAB POTENTIAL: Fair    CLINICAL DECISION MAKING: Unstable/unpredictable  EVALUATION COMPLEXITY: High   GOALS: Goals reviewed with patient? Yes  SHORT TERM GOALS: Target date:  03/14/2022  Pt will be independent with HEP to improve strength and decrease back pain to improve pain-free function at home. Baseline:  Goal status: INITIAL   LONG TERM GOALS: Target date: 04/11/2022  Pt will increase FOTO to at least 57 in order to demonstrate significant improvement in function at home and work related to back pain  Baseline: 02/14/22: To be  completed at next visit; 02/22/22: 45; 03/29/22: Deferred Goal status: INITIAL  2.  Pt will decrease worst back pain by at least 2 points on the NPRS in order to demonstrate clinically significant reduction in back pain. Baseline: 02/14/22: To be completed at next visit; 03/29/22: 10/10; Goal status: INITIAL  3.  Pt will report at least 50% improvement in her back symptoms in order to demonstrate clinically significant reduction in disability related to back pain;        Baseline: 03/29/22: 80% improvement since starting with therapy; Goal status: INITIAL   PLAN: PT FREQUENCY: 1-2x/week  PT DURATION: 8 weeks  PLANNED INTERVENTIONS: Therapeutic exercises, Therapeutic activity, Neuromuscular re-education, Balance training, Gait training, Patient/Family education, Joint manipulation, Joint mobilization, Vestibular training, Canalith repositioning, Aquatic Therapy, Dry Needling, Electrical stimulation, Spinal manipulation, Spinal mobilization, Cryotherapy, Moist heat, Traction, Ultrasound, Ionotophoresis '4mg'$ /ml Dexamethasone, and Manual therapy  PLAN FOR NEXT SESSION: Continue manual techniques for R sided back pain, continue stretching/strengthening;    Phillips Grout PT, DPT, GCS  Physical Therapist- [facility]  04/10/2022, 1:07 PM

## 2022-04-10 ENCOUNTER — Ambulatory Visit: Payer: Medicare Other

## 2022-04-10 DIAGNOSIS — G8929 Other chronic pain: Secondary | ICD-10-CM | POA: Diagnosis not present

## 2022-04-10 DIAGNOSIS — M25511 Pain in right shoulder: Secondary | ICD-10-CM | POA: Diagnosis not present

## 2022-04-10 DIAGNOSIS — M6281 Muscle weakness (generalized): Secondary | ICD-10-CM | POA: Diagnosis not present

## 2022-04-10 DIAGNOSIS — M5459 Other low back pain: Secondary | ICD-10-CM | POA: Diagnosis not present

## 2022-04-12 DIAGNOSIS — M546 Pain in thoracic spine: Secondary | ICD-10-CM | POA: Diagnosis not present

## 2022-04-13 ENCOUNTER — Ambulatory Visit (INDEPENDENT_AMBULATORY_CARE_PROVIDER_SITE_OTHER): Payer: Medicare Other | Admitting: Vascular Surgery

## 2022-04-13 ENCOUNTER — Ambulatory Visit: Payer: Medicare Other

## 2022-04-13 ENCOUNTER — Encounter (INDEPENDENT_AMBULATORY_CARE_PROVIDER_SITE_OTHER): Payer: Medicare Other

## 2022-04-13 DIAGNOSIS — M5459 Other low back pain: Secondary | ICD-10-CM

## 2022-04-13 DIAGNOSIS — G8929 Other chronic pain: Secondary | ICD-10-CM | POA: Diagnosis not present

## 2022-04-13 DIAGNOSIS — M6281 Muscle weakness (generalized): Secondary | ICD-10-CM | POA: Diagnosis not present

## 2022-04-13 DIAGNOSIS — M25511 Pain in right shoulder: Secondary | ICD-10-CM | POA: Diagnosis not present

## 2022-04-13 NOTE — Therapy (Signed)
OUTPATIENT PHYSICAL THERAPY SHOULDER/BACK TREATMENT   Patient Name: Sara [name] MRN: [id number] DOB:1951/05/03, 71 y.o., female Today's Date: 04/14/2022   PT End of Session - 04/14/22 1315     Visit Number 14    Number of Visits 25    Date for PT Re-Evaluation 05/09/22    Authorization Type eval: 02/14/22    PT Start Time 1400    PT Stop Time 1445    PT Time Calculation (min) 45 min    Activity Tolerance Patient tolerated treatment well    Behavior During Therapy [facility] for tasks assessed/performed             Past Medical History:  Diagnosis Date   Arthritis    right shoulder   Depression    Dyspnea    GERD (gastroesophageal reflux disease)    History of blood clots    Hyperlipidemia    Hypertension    Mild mitral regurgitation    Osteoporosis    Peripheral vascular disease ([place])    Stomach ulcer    Vascular disease    Sees Dr. Delana Meyer   Vertigo    Last episode approx Aug 2015   Wears dentures    full upper   Past Surgical History:  Procedure Laterality Date   ADENOIDECTOMY     AMPUTATION Right 08/11/2020   Procedure: AMPUTATION ABOVE KNEE;  Surgeon: [name], MD;  Location: [facility];  Service: Vascular;  Laterality: Right;   ARTERY BIOPSY Right 07/14/2019   Procedure: BIOPSY TEMPORAL ARTERY;  Surgeon: [name], MD;  Location: [facility];  Service: Vascular;  Laterality: Right;   BREAST CYST ASPIRATION Left    CARDIAC CATHETERIZATION  02/15/2017   [facility]   COLONOSCOPY WITH PROPOFOL N/A 10/22/2017   Procedure: COLONOSCOPY WITH PROPOFOL;  Surgeon: Lucilla Lame, MD;  Location: [place];  Service: Endoscopy;  Laterality: N/A;  specimens not taken--pt on Plavix will be brought back in after 7 days off med   COLONOSCOPY WITH PROPOFOL N/A 11/05/2017   Procedure: COLONOSCOPY WITH PROPOFOL;  Surgeon: Lucilla Lame, MD;  Location: [place];  Service: Endoscopy;  Laterality: N/A;   ESOPHAGOGASTRODUODENOSCOPY N/A 12/21/2014    Procedure: ESOPHAGOGASTRODUODENOSCOPY (EGD);  Surgeon: Lucilla Lame, MD;  Location: [place];  Service: Gastroenterology;  Laterality: N/A;   ESOPHAGOGASTRODUODENOSCOPY (EGD) WITH PROPOFOL N/A 02/08/2016   Procedure: ESOPHAGOGASTRODUODENOSCOPY (EGD) WITH PROPOFOL;  Surgeon: Lucilla Lame, MD;  Location: [facility];  Service: Endoscopy;  Laterality: N/A;   FASCIOTOMY Right 05/30/2019   Procedure: FASCIOTOMY;  Surgeon: [name], MD;  Location: [facility];  Service: Vascular;  Laterality: Right;   FASCIOTOMY CLOSURE Right 06/04/2019   Procedure: FASCIOTOMY CLOSURE;  Surgeon: [name], MD;  Location: [facility];  Service: Vascular;  Laterality: Right;   HEMORROIDECTOMY  2014   LOWER EXTREMITY ANGIOGRAPHY Right 05/30/2019   Procedure: LOWER EXTREMITY ANGIOGRAPHY;  Surgeon: [name], MD;  Location: [place] [facility];  Service: Cardiovascular;  Laterality: Right;   LOWER EXTREMITY ANGIOGRAPHY Right 07/02/2020   Procedure: LOWER EXTREMITY ANGIOGRAPHY;  Surgeon: [name], MD;  Location: [place] [facility];  Service: Cardiovascular;  Laterality: Right;   PERIPHERAL VASCULAR CATHETERIZATION N/A 02/09/2016   Procedure: Abdominal Aortogram w/Lower Extremity;  Surgeon: [name], MD;  Location: [place] [facility];  Service: Cardiovascular;  Laterality: N/A;   PERIPHERAL VASCULAR CATHETERIZATION Right 02/10/2016   Procedure: Lower Extremity Angiography;  Surgeon: [name], MD;  Location: [facility]  [facility];  Service: Cardiovascular;  Laterality: Right;   POLYPECTOMY  11/05/2017   Procedure: POLYPECTOMY;  Surgeon: Lucilla Lame, MD;  Location: [place];  Service: Endoscopy;;   SHOULDER ARTHROSCOPY WITH ROTATOR CUFF REPAIR AND SUBACROMIAL DECOMPRESSION Right 02/27/2020   Procedure: RIGHT SHOULDER ARTHROSCOPY SUBACROMIAL DECOMPRESSION, DISTAL CLAVICLE EXCISION AND MINI-OPEN ROTATOR CUFF REPAIR;  Surgeon: Thornton Park, MD;  Location:  [facility];  Service: Orthopedics;  Laterality: Right;   TONSILLECTOMY     VASCULAR SURGERY  [phone]   Fem-Pop Bypass   Patient Active Problem List   Diagnosis Date Noted   Degeneration of lumbar intervertebral disc 01/04/2022   Shoulder pain 01/04/2022   Pain in joint of right shoulder 01/04/2022   Spasm of back muscles 01/04/2022   Thoracic back pain 01/04/2022   History of left above knee amputation [facility]) (Dec 2021) 12/27/2021   Rotator cuff arthropathy of right shoulder 12/27/2021   Hx of rotator cuff surgery 12/27/2021   Chronic pain syndrome 12/27/2021   Adhesive capsulitis of right shoulder 12/05/2021   Phantom pain ([place]) 11/10/2021   Muscle spasm 09/09/2020   S/P AKA (above knee amputation), right ([place]) 09/08/2020   Ischemia of extremity 07/02/2020   Chronic pain in right shoulder 11/25/2019   Encounter for long-term (current) use of high-risk medication 07/16/2019   GCA (giant cell arteritis) ([place]) 07/16/2019   Temporal arteritis ([place]) 07/13/2019   Postoperative wound infection 06/23/2019   Ischemic leg 05/31/2019   Atherosclerosis of native arteries of extremity with intermittent claudication ([place]) 05/30/2019   Depression 05/29/2019   Eczema of lower extremity 03/04/2018   Chronic venous insufficiency 03/02/2018   Personal history of colonic polyps    Family history of colonic polyps    Benign neoplasm of ascending colon    Mitral valve insufficiency 02/08/2017   Dyspnea on exertion 02/07/2017   Non-rheumatic mitral regurgitation 02/07/2017   Precordial pain 02/07/2017   CAD (coronary artery disease) 12/21/2016   Essential hypertension 12/21/2016   Compression fracture of lumbar spine, non-traumatic ([place]) 04/26/2016   Compression fracture of L3 lumbar vertebra 04/20/2016   PVD (peripheral vascular disease) ([place]) 02/24/2016   Family history of premature CAD 02/24/2016   Gastritis    Other specified diseases of esophagus    Hiatal hernia    Gastritis and  gastroduodenitis    Ischemia of lower extremity    Arterial occlusion ([place])    Atherosclerosis of aorta ([place])    History of smoking    Hyperlipidemia    Pain in the chest    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 02/05/2016   Carotid artery stenosis 01/04/2016   Vertigo, benign paroxysmal 12/28/2015   Clinical depression 09/06/2015   History of alcoholism ([place]) 09/06/2015   Angiopathy, 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[place]) 09/06/2015   Atherosclerosis of native arteries of extremity with rest pain ([place]) 09/06/2015   Acute non-recurrent maxillary sinusitis 07/14/2015   Vaginal pruritus 05/26/2015   Chronic recurrent major depressive disorder ([place]) 03/09/2015   Osteoporosis, post-menopausal 03/09/2015   Peripheral blood vessel disorder ([place]) 03/09/2015   Acid reflux 03/09/2015   GERD (gastroesophageal reflux disease) 12/21/2014   Carotid artery narrowing 01/28/2014   Peripheral arterial occlusive disease ([place]) 06/08/2011   Occlusion and stenosis of unspecified carotid artery 06/08/2011   PAD (peripheral artery disease) ([place]) 06/08/2011    PCP: Danelle Berry, NP  REFERRING PROVIDER: Barrie Lyme MD  REFERRING DIAGNOSIS: S29.012A (ICD-10-CM) - Strain of muscle and tendon of back wall of thorax, initial encounter  THERAPY DIAG: Muscle weakness (generalized)  Other low back pain  RATIONALE FOR EVALUATION AND TREATMENT: Rehabilitation  ONSET DATE: 08/11/20 (approximate)  FROM INITIAL EVALUATION  SUBJECTIVE:                                                                                                                                                                                         Chief Complaint: R thoracic/rib pain  Pertinent History Pt reports she she started experiencing R shoulder pain which started shortly after her RLE amputation (08/11/20). She attributes the pain to having to self-propel a wheelchair for an extended period of time. She has seen Dr.  Mack Guise at [facility] in the past and underwent a R shoulder mini-open rotator cuff repair, DCE, and SAD on 02/27/20. Since the pain started she has undergone R shoulder injections without improvement in her pain. When pt describes her pain she points to the area below her R shoulder blade around T10/T11. She denies weakness in the RUE or any pain in the area of the [facility] joint or shoulder pain. Denies N/T in back or RUE. Pain worsens with extended use of RUE especially when cleaning the counters in her apartment or sweeping the floor. She has a history of L1 and L3 compression fractures in 2017. She currently sees pain management for her R shoulder pain and takes oxycodone to manage the pain. She also struggles with chronic RLE phantom pain s/p amputation and takes gabapentin for this issue. She has a history of multiple vascular surgeries prior to her amputation and also has a history of carpal tunnel syndrome.  Pain:  Pain Intensity: Not rated today Pain location: R back/flank inferior to the shoulder blade Pain Quality: sharp  Radiating: No  Numbness/Tingling: No Focal Weakness: No Aggravating factors: Excessive use of RUE, mopping the floors, wiping countertops, no pain with driving, Relieving factors: heating pad, never tried lidocaine patch, oxycodone, gabapentin doesn't help with R flank/back pain; 24-hour pain behavior: varies depending on activity History of prior shoulder or neck/shoulder injury, pain, surgery, or therapy: Yes Falls: Has patient fallen in last 6 months? 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  Recent memory is intact.  Remote memory is intact.  Attention span and concentration are intact.  Expressive speech is intact.  Patient's fund of knowledge is within normal limits for educational level.    Gross Musculoskeletal Assessment Tremor: None Bulk: Normal Tone: Normal  Gait Pt ambulates with RLE prosthesis and lofstrand in LUE, decreased self-selected gait speed;  Posture Increased thoracic kyphosis, forward head, and rounded shoulder in sitting and standing;  Cervical Screen AROM: WFL and painless with overpressure in all planes Spurlings A (ipsilateral lateral flexion/axial compression): R: Negative L: Negative Spurlings B (ipsilateral lateral flexion/contralateral rotation/axial compression): R: Negative L: Negative Repeated movement: Not examined Hoffman Sign (cervical cord compression): R: Not examined L: Not examined ULTT Median: R: Not examined L: Not examined ULTT Ulnar: R: Not examined L: Not examined ULTT Radial: R: Not examined L: Not examined   AROM Cervical AROM is painless in all directions and WFL although not full. No reproduction of pain with AROM R shoulder flexion and abduction which is also limited but WFL. No gross deficits noted. Pt denies pain with thoracic flexion, extension, rotation, and lateral flexion.    LE MMT:  MMT (out of 5) Right 04/14/2022 Left 04/14/2022  Cervical (isometric)  Flexion WNL  Extension WNL  Lateral Flexion WNL WNL  Rotation WNL WNL      Shoulder   Flexion 4+ 4+  Extension    Abduction 4+ 4+  External rotation (seated) 4+ 4+   Internal rotation (seated) 4+ 4+  Horizontal abduction    Horizontal adduction    Lower Trapezius    Rhomboids        Elbow  Flexion 5 5  Extension 5 5  Pronation    Supination        Wrist  Flexion 5 5  Extension 5 5  Radial deviation    Ulnar deviation        MCP  Flexion    Extension    Abduction 5 5  Adduction 5 5  (* = pain; Blank rows = not tested)  Sensation Grossly intact to light touch bilateral UE as determined by testing dermatomes C2-T2. Proprioception and hot/cold testing deferred on this date.  Reflexes Deferred   Palpation Pt is nontender to palpation around entire R shoulder girdle anterior, lateral, and posterior. No pain with palpation to R cervical paraspinals, upper trap, rhomboids, or mid trap. She has pain to palpation along spinous processes from T9-L3 as well as along paraspinals in this region on the right.   Repeated Movements Deferred  Passive Accessory Intervertebral Motion Pt reports reproduction of pain with CPA and bilateral UPA T9-L3. Unable to fully assess mobility secondary to pain however appear grossly hypomobile with notable thoracic kyphosis when prone.   SPECIAL TESTS Rotator Cuff  Drop Arm Test: Negative Painful Arc (Pain from 60 to 120 degrees scaption): Negative Infraspinatus Muscle Test: Negative   Subacromial Impingement Hawkins-Kennedy: Negative Neer (Block scapula, PROM flexion): Negative Painful Arc (Pain from 60 to 120 degrees scaption): Negative Empty Can: Negative External Rotation Resistance: Negative Horizontal Adduction: Not examined Scapular Assist: Not examined   Labral Tear Biceps Load II (120 elevation, full ER, 90 elbow flexion, full supination, resisted elbow flexion): Not examined Crank (160 scaption, axial load with IR/ER): Not examined Active Compression Test: Not examined  Bicep  Tendon Pathology Speed (shoulder flexion to 90, external rotation, full elbow extension, and forearm  supination with resistance: Not examined Yergason's (resisted shoulder ER and supination/biceps tendon pathology): Not examined  Shoulder Instability Sulcus Sign: Not examined Anterior Apprehension: Not examined  Beighton scale: Deferred   Outcome Measures Quick DASH: Deferred FOTO: Pt completed shoulder FOTO but this appears to be more related to back pain. Will update at next therapy session.    TODAY'S TREATMENT   SUBJECTIVE: Pt denies any back/"R shoulder" pain at rest this morning. She did not have to take any oxycodone this morning due to pain. Her low back continues to be pain-free. She saw Dr. Mack Guise at [facility] who ordered a thoracic MRI. No specific questions currently.   PAIN: Denies   Ther-ex: Prone alternating hip extension x 15 BLE; L sidelying R hip abduction with manual resistance 2 x 10; L sidelying R hip extension with manual resistance 2 x 10; Seated PVC chest press with manual resistance 2 x 10; Seated PVC row with manual resistance 2 x 10; Seated shoulder flexion with 3# dumbbells 2 x 10; Seated shoulder scaption with 3# dumbbells 2 x 10; Seated overhead shoulder press with 3# dumbbells x 10, x 5, discontinued second set due to increase in thoracic/R flank pain;   Manual Therapy  Moist heat pack applied to R thoracic spine/flank with pt in prone at start of session x 5 minutes (unbilled); Extensive STM to R thoracic/upper lumbar erector spinae group and latissimus with effeurage, shingling, and use of Theraband Roller x 10 minutes;   Not performed: Supine SLR with manual resistance on R side 2 x 10 BLE; Supine bolster bridges 2 x 10; Supine R shoulder flexion from neutral to 90 with 2# dumbbell (DB) x 10, 3# DB x 10; Supine R shoulder serratus punch at 90 flexion with 2# DB x 10, 3# DB x 10; Supine R shoulder circles at 90 flexion with 2# DB x 10 each direction, 3# DB x 10 each direction; L sidelying R shoulder ER with 3# DB 2 x 10; L  sidleying R shoulder abduction with 3# DB 2 x 10; Sidelying hip abduction with manual resistance 2 x 10 BLE; Seated dynamic hug with blue tband 2 x 10; Seated pallof press with green tband x 10 toward each side; Seated scapular retractions 2 x 10; Prone R shoulder extension with 3# dumbbell (DB) 2 x 10; Prone R low row with 3# DB 2 x 10; Prone R horizontal abduction with 3# DB 2 x 10; Prone R low trap flexion (Y) with 3# DB 2 x 10; Prone R shoulder ER with 3# DB 2 x 10;    PATIENT EDUCATION:  Education details: Pt educated throughout session about proper posture and technique with exercises. Improved exercise technique, movement at target joints, use of target muscles after min to mod verbal, visual, tactile cues.  Person educated: Patient Education method: Explanation, verbal cues, tactile cues, and demonstration Education comprehension: verbalized understanding and returned demonstration;   HOME EXERCISE PROGRAM: None currently   ASSESSMENT:  CLINICAL IMPRESSION: Continued with STM to improve muscle extensibility. Right upper quarter strengthening repeated again during session with intermittent R sided thoracic back pain. Pt will have thoracic MRI which will hopefully provide more answers to her pain. While she has experienced considerable improvement in her pain since starting with therapy she still has notable pain. Pt encouraged to follow-up as scheduled. Frequency will be decreased to 1x/wk. She will benefit from PT services  to address deficits in strength and pain in order to return to full function at home with less pain.    REHAB POTENTIAL: Fair    CLINICAL DECISION MAKING: Unstable/unpredictable  EVALUATION COMPLEXITY: High   GOALS: Goals reviewed with patient? Yes  SHORT TERM GOALS: Target date:  03/14/2022  Pt will be independent with HEP to improve strength and decrease back pain to improve pain-free function at home. Baseline:  Goal status: INITIAL   LONG  TERM GOALS: Target date: 04/11/2022  Pt will increase FOTO to at least 57 in order to demonstrate significant improvement in function at home and work related to back pain  Baseline: 02/14/22: To be completed at next visit; 02/22/22: 45; 03/29/22: Deferred Goal status: INITIAL  2.  Pt will decrease worst back pain by at least 2 points on the NPRS in order to demonstrate clinically significant reduction in back pain. Baseline: 02/14/22: To be completed at next visit; 03/29/22: 10/10; Goal status: INITIAL  3.  Pt will report at least 50% improvement in her back symptoms in order to demonstrate clinically significant reduction in disability related to back pain;        Baseline: 03/29/22: 80% improvement since starting with therapy; Goal status: INITIAL   PLAN: PT FREQUENCY: 1-2x/week  PT DURATION: 8 weeks  PLANNED INTERVENTIONS: Therapeutic exercises, Therapeutic activity, Neuromuscular re-education, Balance training, Gait training, Patient/Family education, Joint manipulation, Joint mobilization, Vestibular training, Canalith repositioning, Aquatic Therapy, Dry Needling, Electrical stimulation, Spinal manipulation, Spinal mobilization, Cryotherapy, Moist heat, Traction, Ultrasound, Ionotophoresis '4mg'$ /ml Dexamethasone, and Manual therapy  PLAN FOR NEXT SESSION: Continue manual techniques for R sided back pain, continue stretching/strengthening;    Phillips Grout PT, DPT, GCS  Physical Therapist- [facility]  04/14/2022, 1:18 PM

## 2022-04-18 ENCOUNTER — Ambulatory Visit: Payer: Medicare Other

## 2022-04-18 DIAGNOSIS — M4854XA Collapsed vertebra, not elsewhere classified, thoracic region, initial encounter for fracture: Secondary | ICD-10-CM | POA: Diagnosis not present

## 2022-04-18 DIAGNOSIS — G8929 Other chronic pain: Secondary | ICD-10-CM

## 2022-04-18 DIAGNOSIS — M4804 Spinal stenosis, thoracic region: Secondary | ICD-10-CM | POA: Diagnosis not present

## 2022-04-18 DIAGNOSIS — M6281 Muscle weakness (generalized): Secondary | ICD-10-CM

## 2022-04-18 DIAGNOSIS — M546 Pain in thoracic spine: Secondary | ICD-10-CM | POA: Diagnosis not present

## 2022-04-18 DIAGNOSIS — M5459 Other low back pain: Secondary | ICD-10-CM

## 2022-04-18 DIAGNOSIS — M47814 Spondylosis without myelopathy or radiculopathy, thoracic region: Secondary | ICD-10-CM | POA: Diagnosis not present

## 2022-04-18 DIAGNOSIS — M4314 Spondylolisthesis, thoracic region: Secondary | ICD-10-CM | POA: Diagnosis not present

## 2022-04-18 NOTE — Therapy (Unsigned)
OUTPATIENT PHYSICAL THERAPY SHOULDER/BACK TREATMENT  Patient Name: Sara [name] MRN: [id number] DOB:09-Sep-1950, 71 y.o., female Today's Date: 04/20/2022   PT End of Session - 04/19/22 1350     Visit Number 15    Number of Visits 25    Date for PT Re-Evaluation 05/09/22    Authorization Type eval: 02/14/22    PT Start Time 1400    PT Stop Time 1445    PT Time Calculation (min) 45 min    Activity Tolerance Patient tolerated treatment well    Behavior During Therapy [facility] for tasks assessed/performed             Past Medical History:  Diagnosis Date   Arthritis    right shoulder   Depression    Dyspnea    GERD (gastroesophageal reflux disease)    History of blood clots    Hyperlipidemia    Hypertension    Mild mitral regurgitation    Osteoporosis    Peripheral vascular disease ([place])    Stomach ulcer    Vascular disease    Sees Dr. Delana Meyer   Vertigo    Last episode approx Aug 2015   Wears dentures    full upper   Past Surgical History:  Procedure Laterality Date   ADENOIDECTOMY     AMPUTATION Right 08/11/2020   Procedure: AMPUTATION ABOVE KNEE;  Surgeon: [name], MD;  Location: [facility];  Service: Vascular;  Laterality: Right;   ARTERY BIOPSY Right 07/14/2019   Procedure: BIOPSY TEMPORAL ARTERY;  Surgeon: [name], MD;  Location: [facility];  Service: Vascular;  Laterality: Right;   BREAST CYST ASPIRATION Left    CARDIAC CATHETERIZATION  02/15/2017   [facility]   COLONOSCOPY WITH PROPOFOL N/A 10/22/2017   Procedure: COLONOSCOPY WITH PROPOFOL;  Surgeon: Lucilla Lame, MD;  Location: [place];  Service: Endoscopy;  Laterality: N/A;  specimens not taken--pt on Plavix will be brought back in after 7 days off med   COLONOSCOPY WITH PROPOFOL N/A 11/05/2017   Procedure: COLONOSCOPY WITH PROPOFOL;  Surgeon: Lucilla Lame, MD;  Location: [place];  Service: Endoscopy;  Laterality: N/A;   ESOPHAGOGASTRODUODENOSCOPY N/A 12/21/2014    Procedure: ESOPHAGOGASTRODUODENOSCOPY (EGD);  Surgeon: Lucilla Lame, MD;  Location: [place];  Service: Gastroenterology;  Laterality: N/A;   ESOPHAGOGASTRODUODENOSCOPY (EGD) WITH PROPOFOL N/A 02/08/2016   Procedure: ESOPHAGOGASTRODUODENOSCOPY (EGD) WITH PROPOFOL;  Surgeon: Lucilla Lame, MD;  Location: [facility];  Service: Endoscopy;  Laterality: N/A;   FASCIOTOMY Right 05/30/2019   Procedure: FASCIOTOMY;  Surgeon: [name], MD;  Location: [facility];  Service: Vascular;  Laterality: Right;   FASCIOTOMY CLOSURE Right 06/04/2019   Procedure: FASCIOTOMY CLOSURE;  Surgeon: [name], MD;  Location: [facility];  Service: Vascular;  Laterality: Right;   HEMORROIDECTOMY  2014   LOWER EXTREMITY ANGIOGRAPHY Right 05/30/2019   Procedure: LOWER EXTREMITY ANGIOGRAPHY;  Surgeon: [name], MD;  Location: [place] [facility];  Service: Cardiovascular;  Laterality: Right;   LOWER EXTREMITY ANGIOGRAPHY Right 07/02/2020   Procedure: LOWER EXTREMITY ANGIOGRAPHY;  Surgeon: [name], MD;  Location: [place] [facility];  Service: Cardiovascular;  Laterality: Right;   PERIPHERAL VASCULAR CATHETERIZATION N/A 02/09/2016   Procedure: Abdominal Aortogram w/Lower Extremity;  Surgeon: [name], MD;  Location: [place] [facility];  Service: Cardiovascular;  Laterality: N/A;   PERIPHERAL VASCULAR CATHETERIZATION Right 02/10/2016   Procedure: Lower Extremity Angiography;  Surgeon: [name], MD;  Location: [place] [facility];  Service: Cardiovascular;  Laterality: Right;   POLYPECTOMY  11/05/2017   Procedure: POLYPECTOMY;  Surgeon: Lucilla Lame, MD;  Location: [place];  Service: Endoscopy;;   SHOULDER ARTHROSCOPY WITH ROTATOR CUFF REPAIR AND SUBACROMIAL DECOMPRESSION Right 02/27/2020   Procedure: RIGHT SHOULDER ARTHROSCOPY SUBACROMIAL DECOMPRESSION, DISTAL CLAVICLE EXCISION AND MINI-OPEN ROTATOR CUFF REPAIR;  Surgeon: Thornton Park, MD;  Location:  [facility];  Service: Orthopedics;  Laterality: Right;   TONSILLECTOMY     VASCULAR SURGERY  0258,5277   Fem-Pop Bypass   Patient Active Problem List   Diagnosis Date Noted   Degeneration of lumbar intervertebral disc 01/04/2022   Shoulder pain 01/04/2022   Pain in joint of right shoulder 01/04/2022   Spasm of back muscles 01/04/2022   Thoracic back pain 01/04/2022   History of left above knee amputation [facility]) (Dec 2021) 12/27/2021   Rotator cuff arthropathy of right shoulder 12/27/2021   Hx of rotator cuff surgery 12/27/2021   Chronic pain syndrome 12/27/2021   Adhesive capsulitis of right shoulder 12/05/2021   Phantom pain ([place]) 11/10/2021   Muscle spasm 09/09/2020   S/P AKA (above knee amputation), right ([place]) 09/08/2020   Ischemia of extremity 07/02/2020   Chronic pain in right shoulder 11/25/2019   Encounter for long-term (current) use of high-risk medication 07/16/2019   GCA (giant cell arteritis) ([place]) 07/16/2019   Temporal arteritis ([place]) 07/13/2019   Postoperative wound infection 06/23/2019   Ischemic leg 05/31/2019   Atherosclerosis of native arteries of extremity with intermittent claudication ([place]) 05/30/2019   Depression 05/29/2019   Eczema of lower extremity 03/04/2018   Chronic venous insufficiency 03/02/2018   Personal history of colonic polyps    Family history of colonic polyps    Benign neoplasm of ascending colon    Mitral valve insufficiency 02/08/2017   Dyspnea on exertion 02/07/2017   Non-rheumatic mitral regurgitation 02/07/2017   Precordial pain 02/07/2017   CAD (coronary artery disease) 12/21/2016   Essential hypertension 12/21/2016   Compression fracture of lumbar spine, non-traumatic ([place]) 04/26/2016   Compression fracture of L3 lumbar vertebra 04/20/2016   PVD (peripheral vascular disease) ([place]) 02/24/2016   Family history of premature CAD 02/24/2016   Gastritis    Other specified diseases of esophagus    Hiatal hernia    Gastritis and  gastroduodenitis    Ischemia of lower extremity    Arterial occlusion ([place])    Atherosclerosis of aorta ([place])    History of smoking    Hyperlipidemia    Pain in the chest    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 02/05/2016   Carotid artery stenosis 01/04/2016   Vertigo, benign paroxysmal 12/28/2015   Clinical depression 09/06/2015   History of alcoholism ([place]) 09/06/2015   Angiopathy, 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[place]) 09/06/2015   Atherosclerosis of native arteries of extremity with rest pain ([place]) 09/06/2015   Acute non-recurrent maxillary sinusitis 07/14/2015   Vaginal pruritus 05/26/2015   Chronic recurrent major depressive disorder ([place]) 03/09/2015   Osteoporosis, post-menopausal 03/09/2015   Peripheral blood vessel disorder ([place]) 03/09/2015   Acid reflux 03/09/2015   GERD (gastroesophageal reflux disease) 12/21/2014   Carotid artery narrowing 01/28/2014   Peripheral arterial occlusive disease ([place]) 06/08/2011   Occlusion and stenosis of unspecified carotid artery 06/08/2011   PAD (peripheral artery disease) ([place]) 06/08/2011    PCP: Danelle Berry, NP  REFERRING PROVIDER: Barrie Lyme MD  REFERRING DIAGNOSIS: S29.012A (ICD-10-CM) - Strain of muscle and tendon of back wall of thorax, initial encounter  THERAPY DIAG:  Muscle weakness (generalized)  Other low back pain  Chronic right shoulder pain  RATIONALE FOR EVALUATION AND TREATMENT: Rehabilitation  ONSET DATE: 08/11/20 (approximate)  FROM INITIAL EVALUATION  SUBJECTIVE:                                                                                                                                                                                         Chief Complaint: R thoracic/rib pain  Pertinent History Pt reports she she started experiencing R shoulder pain which started shortly after her RLE amputation (08/11/20). She attributes the pain to having to self-propel a wheelchair for an extended  period of time. She has seen Dr. Mack Guise at [facility] in the past and underwent a R shoulder mini-open rotator cuff repair, DCE, and SAD on 02/27/20. Since the pain started she has undergone R shoulder injections without improvement in her pain. When pt describes her pain she points to the area below her R shoulder blade around T10/T11. She denies weakness in the RUE or any pain in the area of the [facility] joint or shoulder pain. Denies N/T in back or RUE. Pain worsens with extended use of RUE especially when cleaning the counters in her apartment or sweeping the floor. She has a history of L1 and L3 compression fractures in 2017. She currently sees pain management for her R shoulder pain and takes oxycodone to manage the pain. She also struggles with chronic RLE phantom pain s/p amputation and takes gabapentin for this issue. She has a history of multiple vascular surgeries prior to her amputation and also has a history of carpal tunnel syndrome.  Pain:  Pain Intensity: Not rated today Pain location: R back/flank inferior to the shoulder blade Pain Quality: sharp  Radiating: No  Numbness/Tingling: No Focal Weakness: No Aggravating factors: Excessive use of RUE, mopping the floors, wiping countertops, no pain with driving, Relieving factors: heating pad, never tried lidocaine patch, oxycodone, gabapentin doesn't help with R flank/back pain; 24-hour pain behavior: varies depending on activity History of prior shoulder or neck/shoulder injury, pain, surgery, or therapy: Yes Falls: Has patient fallen in last 6 months? 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  Recent memory is intact.  Remote memory is intact.  Attention span and concentration are intact.  Expressive speech is intact.  Patient's fund of knowledge is within normal limits for educational level.    Gross Musculoskeletal Assessment Tremor: None Bulk: Normal Tone: Normal  Gait Pt ambulates with RLE prosthesis and lofstrand in LUE, decreased self-selected gait speed;  Posture Increased thoracic kyphosis, forward head, and rounded shoulder in sitting and standing;  Cervical Screen AROM: WFL and painless with overpressure in all planes Spurlings A (ipsilateral lateral flexion/axial compression): R: Negative L: Negative Spurlings B (ipsilateral lateral flexion/contralateral rotation/axial compression): R: Negative L: Negative Repeated movement: Not examined Hoffman Sign (cervical cord compression): R: Not examined L: Not examined ULTT Median: R: Not examined L: Not examined ULTT Ulnar: R: Not examined L: Not examined ULTT Radial: R: Not examined L: Not examined   AROM Cervical AROM is painless in all directions and WFL although not full. No reproduction of pain with AROM R shoulder flexion and abduction which is also limited but WFL. No gross deficits noted. Pt denies pain with thoracic flexion, extension, rotation, and lateral flexion.    LE MMT:  MMT (out of 5) Right 04/20/2022 Left 04/20/2022  Cervical (isometric)  Flexion WNL  Extension WNL  Lateral Flexion WNL WNL  Rotation WNL WNL      Shoulder   Flexion 4+ 4+  Extension    Abduction 4+ 4+   External rotation (seated) 4+ 4+  Internal rotation (seated) 4+ 4+  Horizontal abduction    Horizontal adduction    Lower Trapezius    Rhomboids        Elbow  Flexion 5 5  Extension 5 5  Pronation    Supination        Wrist  Flexion 5 5  Extension 5 5  Radial deviation    Ulnar deviation        MCP  Flexion    Extension    Abduction 5 5  Adduction 5 5  (* = pain; Blank rows = not tested)  Sensation Grossly intact to light touch bilateral UE as determined by testing dermatomes C2-T2. Proprioception and hot/cold testing deferred on this date.  Reflexes Deferred  Palpation Pt is nontender to palpation around entire R shoulder girdle anterior, lateral, and posterior. No pain with palpation to R cervical paraspinals, upper trap, rhomboids, or mid trap. She has pain to palpation along spinous processes from T9-L3 as well as along paraspinals in this region on the right.  Repeated Movements Deferred  Passive Accessory Intervertebral Motion Pt reports reproduction of pain with CPA and bilateral UPA T9-L3. Unable to fully assess mobility secondary to pain however appear grossly hypomobile with notable thoracic kyphosis when prone.  SPECIAL TESTS Rotator Cuff  Drop Arm Test: Negative Painful Arc (Pain from 60 to 120 degrees scaption): Negative Infraspinatus Muscle Test: Negative  Subacromial Impingement Hawkins-Kennedy: Negative Neer (Block scapula, PROM flexion): Negative Painful Arc (Pain from 60 to 120 degrees scaption): Negative Empty Can: Negative External Rotation Resistance: Negative Horizontal Adduction: Not examined Scapular Assist: Not examined  Labral Tear Biceps Load II (120 elevation, full ER, 90 elbow flexion, full supination, resisted elbow flexion): Not examined Crank (160 scaption, axial load with IR/ER): Not examined Active Compression Test: Not examined  Bicep Tendon Pathology  Speed (shoulder flexion to 90, external rotation, full elbow  extension, and forearm supination with resistance: Not examined Yergason's (resisted shoulder ER and supination/biceps tendon pathology): Not examined  Shoulder Instability Sulcus Sign: Not examined Anterior Apprehension: Not examined  Beighton scale: Deferred  Outcome Measures Quick DASH: Deferred FOTO: Pt completed shoulder FOTO but this appears to be more related to back pain. Will update at next therapy session.    TODAY'S TREATMENT   SUBJECTIVE: Pt denies any back/"R shoulder" pain at rest today. She saw Dr. Mack Guise today to review her thoracic MRI findings and was told that lesions were found on her thoracic vertebrae. She is being referred to oncology for further work-up. She is feeling anxious regarding these results.   PAIN: Denies   Ther-ex: Prone alternating hip extension 2 x 10 BLE; L sidelying R hip abduction with manual resistance 2 x 10; L sidelying L hip adduction with manual resistance 2 x 10; Supine bolster knee bridges 2 x 10; R sidelying L hip abduction with manual resistance 2 x 10; R sidelying R hip adduction with manual resistance 2 x 10; R sidelying L hip clams with manual resistance 2 x 10; Extensive conversation regarding MRI findings and implications for therapy;   Not performed: Supine SLR with manual resistance on R side 2 x 10 BLE; Supine R shoulder flexion from neutral to 90 with 2# dumbbell (DB) x 10, 3# DB x 10; Supine R shoulder serratus punch at 90 flexion with 2# DB x 10, 3# DB x 10; Supine R shoulder circles at 90 flexion with 2# DB x 10 each direction, 3# DB x 10 each direction; L sidelying R shoulder ER with 3# DB 2 x 10; L sidleying R shoulder abduction with 3# DB 2 x 10; Seated dynamic hug with blue tband 2 x 10; Seated pallof press with green tband x 10 toward each side; Seated scapular retractions 2 x 10; Prone R shoulder extension with 3# dumbbell (DB) 2 x 10; Prone R low row with 3# DB 2 x 10; Prone R horizontal abduction  with 3# DB 2 x 10; Prone R low trap flexion (Y) with 3# DB 2 x 10; Prone R shoulder ER with 3# DB 2 x 10; Seated PVC chest press with manual resistance 2 x 10; Seated PVC row with manual resistance 2 x 10; Seated shoulder flexion with 3# dumbbells 2 x 10; Seated shoulder scaption with 3# dumbbells 2 x 10; Seated overhead shoulder press with 3# dumbbells x 10, x 5, discontinued second set due to increase in thoracic/R flank pain;    PATIENT EDUCATION:  Education details: Pt educated throughout session about proper posture and technique with exercises. Improved exercise technique, movement at target joints, use of target muscles after min to mod verbal, visual, tactile cues. Discussed MRI results and need for patient to follow-up with oncology Person educated: Patient Education method: Explanation, verbal cues, tactile cues, and demonstration Education comprehension: verbalized understanding and returned demonstration;   HOME EXERCISE PROGRAM: None currently   ASSESSMENT:  CLINICAL IMPRESSION: Continued with back/hip/core strengthening during session today. She has experienced considerable improvement in her pain since starting with therapy she still has notable pain. Discussed MRI results with patient and encouraged her to follow-up with oncology for additional work-up. Pt encouraged to follow-up as scheduled with therapy until additional plan is in place with oncology. Frequency will be maintained at 1x/wk. She will benefit from PT services to address deficits in strength and pain in order to  return to full function at home with less pain.    REHAB POTENTIAL: Fair    CLINICAL DECISION MAKING: Unstable/unpredictable  EVALUATION COMPLEXITY: High   GOALS: Goals reviewed with patient? Yes  SHORT TERM GOALS: Target date:  03/14/2022  Pt will be independent with HEP to improve strength and decrease back pain to improve pain-free function at home. Baseline:  Goal status:  INITIAL   LONG TERM GOALS: Target date: 04/11/2022  Pt will increase FOTO to at least 57 in order to demonstrate significant improvement in function at home and work related to back pain  Baseline: 02/14/22: To be completed at next visit; 02/22/22: 45; 03/29/22: Deferred Goal status: INITIAL  2.  Pt will decrease worst back pain by at least 2 points on the NPRS in order to demonstrate clinically significant reduction in back pain. Baseline: 02/14/22: To be completed at next visit; 03/29/22: 10/10; Goal status: INITIAL  3.  Pt will report at least 50% improvement in her back symptoms in order to demonstrate clinically significant reduction in disability related to back pain;        Baseline: 03/29/22: 80% improvement since starting with therapy; Goal status: INITIAL   PLAN: PT FREQUENCY: 1-2x/week  PT DURATION: 8 weeks  PLANNED INTERVENTIONS: Therapeutic exercises, Therapeutic activity, Neuromuscular re-education, Balance training, Gait training, Patient/Family education, Joint manipulation, Joint mobilization, Vestibular training, Canalith repositioning, Aquatic Therapy, Dry Needling, Electrical stimulation, Spinal manipulation, Spinal mobilization, Cryotherapy, Moist heat, Traction, Ultrasound, Ionotophoresis '4mg'$ /ml Dexamethasone, and Manual therapy  PLAN FOR NEXT SESSION: Continue manual techniques for R sided back pain, continue stretching/strengthening;    Phillips Grout PT, DPT, GCS  Physical Therapist- [facility]  04/20/2022, 1:33 PM

## 2022-04-19 ENCOUNTER — Ambulatory Visit: Payer: Medicare Other

## 2022-04-19 DIAGNOSIS — G8929 Other chronic pain: Secondary | ICD-10-CM

## 2022-04-19 DIAGNOSIS — M6281 Muscle weakness (generalized): Secondary | ICD-10-CM | POA: Diagnosis not present

## 2022-04-19 DIAGNOSIS — M25511 Pain in right shoulder: Secondary | ICD-10-CM | POA: Diagnosis not present

## 2022-04-19 DIAGNOSIS — M999 Biomechanical lesion, unspecified: Secondary | ICD-10-CM | POA: Diagnosis not present

## 2022-04-19 DIAGNOSIS — M546 Pain in thoracic spine: Secondary | ICD-10-CM | POA: Diagnosis not present

## 2022-04-19 DIAGNOSIS — M4854XA Collapsed vertebra, not elsewhere classified, thoracic region, initial encounter for fracture: Secondary | ICD-10-CM | POA: Diagnosis not present

## 2022-04-19 DIAGNOSIS — M5414 Radiculopathy, thoracic region: Secondary | ICD-10-CM | POA: Diagnosis not present

## 2022-04-19 DIAGNOSIS — M5459 Other low back pain: Secondary | ICD-10-CM | POA: Diagnosis not present

## 2022-04-20 DIAGNOSIS — M5136 Other intervertebral disc degeneration, lumbar region: Secondary | ICD-10-CM | POA: Diagnosis not present

## 2022-04-20 DIAGNOSIS — G894 Chronic pain syndrome: Secondary | ICD-10-CM | POA: Diagnosis not present

## 2022-04-20 DIAGNOSIS — M546 Pain in thoracic spine: Secondary | ICD-10-CM | POA: Diagnosis not present

## 2022-04-20 DIAGNOSIS — M25511 Pain in right shoulder: Secondary | ICD-10-CM | POA: Diagnosis not present

## 2022-04-20 DIAGNOSIS — M25519 Pain in unspecified shoulder: Secondary | ICD-10-CM | POA: Diagnosis not present

## 2022-04-20 DIAGNOSIS — Z79899 Other long term (current) drug therapy: Secondary | ICD-10-CM | POA: Diagnosis not present

## 2022-04-20 DIAGNOSIS — M62830 Muscle spasm of back: Secondary | ICD-10-CM | POA: Diagnosis not present

## 2022-04-20 DIAGNOSIS — Z79891 Long term (current) use of opiate analgesic: Secondary | ICD-10-CM | POA: Diagnosis not present

## 2022-04-21 ENCOUNTER — Ambulatory Visit
Admission: RE | Admit: 2022-04-21 | Discharge: 2022-04-21 | Disposition: A | Discharge status: Home / Self Care | Payer: Self-pay | Source: Ambulatory Visit | Attending: Internal Medicine | Admitting: Internal Medicine

## 2022-04-21 ENCOUNTER — Inpatient Hospital Stay: Payer: Medicare Other

## 2022-04-21 ENCOUNTER — Encounter: Payer: Self-pay | Admitting: Internal Medicine

## 2022-04-21 ENCOUNTER — Inpatient Hospital Stay: Payer: Medicare Other | Attending: Internal Medicine | Admitting: Internal Medicine

## 2022-04-21 VITALS — BP 126/75 | HR 87 | Temp 98.2°F | Wt 127.4 lb

## 2022-04-21 DIAGNOSIS — M546 Pain in thoracic spine: Secondary | ICD-10-CM | POA: Insufficient documentation

## 2022-04-21 DIAGNOSIS — M8008XA Age-related osteoporosis with current pathological fracture, vertebra(e), initial encounter for fracture: Secondary | ICD-10-CM | POA: Diagnosis not present

## 2022-04-21 DIAGNOSIS — R937 Abnormal findings on diagnostic imaging of other parts of musculoskeletal system: Secondary | ICD-10-CM | POA: Insufficient documentation

## 2022-04-21 DIAGNOSIS — G8929 Other chronic pain: Secondary | ICD-10-CM | POA: Insufficient documentation

## 2022-04-21 LAB — COMPREHENSIVE METABOLIC PANEL
ALT: 36 U/L (ref 0–44)
AST: 32 U/L (ref 15–41)
Albumin: 4 g/dL (ref 3.5–5.0)
Alkaline Phosphatase: 57 U/L (ref 38–126)
Anion gap: 7 (ref 5–15)
BUN: 9 mg/dL (ref 8–23)
CO2: 27 mmol/L (ref 22–32)
Calcium: 8.7 mg/dL — ABNORMAL LOW (ref 8.9–10.3)
Chloride: 102 mmol/L (ref 98–111)
Creatinine, Ser: 0.76 mg/dL (ref 0.44–1.00)
GFR, Estimated: >60 mL/min (ref >60)
Glucose, Bld: 103 mg/dL — ABNORMAL HIGH (ref 70–99)
Potassium: 4.5 mmol/L (ref 3.5–5.1)
Sodium: 136 mmol/L (ref 135–145)
Total Bilirubin: 0.4 mg/dL (ref 0.3–1.2)
Total Protein: 6.6 g/dL (ref 6.5–8.1)

## 2022-04-21 LAB — CBC WITH DIFFERENTIAL/PLATELET
Abs Immature Granulocytes: 0.02 10*3/uL (ref 0.00–0.07)
Basophils Absolute: 0.1 10*3/uL (ref 0.0–0.1)
Basophils Relative: 1 %
Eosinophils Absolute: 0.3 10*3/uL (ref 0.0–0.5)
Eosinophils Relative: 4 %
HCT: 44.3 % (ref 36.0–46.0)
Hemoglobin: 14.3 g/dL (ref 12.0–15.0)
Immature Granulocytes: 0 %
Lymphocytes Relative: 12 %
Lymphs Abs: 0.8 10*3/uL (ref 0.7–4.0)
MCH: 31.3 pg (ref 26.0–34.0)
MCHC: 32.3 g/dL (ref 30.0–36.0)
MCV: 96.9 fL (ref 80.0–100.0)
Monocytes Absolute: 0.8 10*3/uL (ref 0.1–1.0)
Monocytes Relative: 12 %
Neutro Abs: 4.8 10*3/uL (ref 1.7–7.7)
Neutrophils Relative %: 71 %
Platelets: 182 10*3/uL (ref 150–400)
RBC: 4.57 MIL/uL (ref 3.87–5.11)
RDW: 13.9 % (ref 11.5–15.5)
WBC: 6.9 10*3/uL (ref 4.0–10.5)
nRBC: 0 % (ref 0.0–0.2)

## 2022-04-21 LAB — LACTATE DEHYDROGENASE: LDH: 169 U/L (ref 98–192)

## 2022-04-21 NOTE — Progress Notes (Signed)
[place]  Telephone:([phone]) [phone] Fax:(336) [phone]  ID: [name] OB: June 03, 1951  MR#: [id number]  FMB#:[id number]  Patient Care Team: Danelle Berry, NP as PCP - General (Nurse Practitioner)  REFERRING PROVIDER: Dr. Mack Guise  REASON FOR REFERRAL: abnormal spine MRI  HPI: Margalit Leece is a 71 y.o. female with pmh of PAD s/p right AKA, CAD, HTN, HLD, chronic back pain referred to Oncology for further work up of abnormal spine MRI.   Patient reports history of chronic pain in the back specifically on the right lower side when she is doing any activity.  She had Xray of T spine and lumbar spine on 02/23/2022 which showed degenerative changes and chronic compression fracture of T11, L1 and L3.  Per patient, she had MRI of the spine done in August 2023 and was told that she has multiple spots in her spine indicated of cancer.  She follows with pain clinic and receives nerve block.  Patient denies fever, chills, night sweats, new lumps or bumps, nausea, vomiting, shortness of breath, cough, abdominal pain, bleeding, bowel or bladder issues. Energy level is good.  Appetite is good.  Denies any weight loss.  Denies dizziness, headache, double vision.  She has progressive blurry vision over time and sees ophthalmologist once a year.  REVIEW OF SYSTEMS:   ROS  As per HPI. Otherwise, a complete review of systems is negative.  PAST MEDICAL HISTORY: Past Medical History:  Diagnosis Date   Arthritis    right shoulder   Depression    Dyspnea    GERD (gastroesophageal reflux disease)    History of blood clots    Hyperlipidemia    Hypertension    Mild mitral regurgitation    Osteoporosis    Peripheral vascular disease (HCC)    Stomach ulcer    Vascular disease    Sees Dr. Delana Meyer   Vertigo    Last episode approx Aug 2015   Wears dentures    full upper    PAST SURGICAL HISTORY: Past Surgical History:  Procedure Laterality Date    ADENOIDECTOMY     AMPUTATION Right 08/11/2020   Procedure: AMPUTATION ABOVE KNEE;  Surgeon: [name], MD;  Location: [facility];  Service: Vascular;  Laterality: Right;   ARTERY BIOPSY Right 07/14/2019   Procedure: BIOPSY TEMPORAL ARTERY;  Surgeon: [name], MD;  Location: [facility];  Service: Vascular;  Laterality: Right;   BREAST CYST ASPIRATION Left    CARDIAC CATHETERIZATION  02/15/2017   [facility]   COLONOSCOPY WITH PROPOFOL N/A 10/22/2017   Procedure: COLONOSCOPY WITH PROPOFOL;  Surgeon: Lucilla Lame, MD;  Location: [place];  Service: Endoscopy;  Laterality: N/A;  specimens not taken--pt on Plavix will be brought back in after 7 days off med   COLONOSCOPY WITH PROPOFOL N/A 11/05/2017   Procedure: COLONOSCOPY WITH PROPOFOL;  Surgeon: Lucilla Lame, MD;  Location: [place];  Service: Endoscopy;  Laterality: N/A;   ESOPHAGOGASTRODUODENOSCOPY N/A 12/21/2014   Procedure: ESOPHAGOGASTRODUODENOSCOPY (EGD);  Surgeon: Lucilla Lame, MD;  Location: [place];  Service: Gastroenterology;  Laterality: N/A;   ESOPHAGOGASTRODUODENOSCOPY (EGD) WITH PROPOFOL N/A 02/08/2016   Procedure: ESOPHAGOGASTRODUODENOSCOPY (EGD) WITH PROPOFOL;  Surgeon: Lucilla Lame, MD;  Location: [facility];  Service: Endoscopy;  Laterality: N/A;   FASCIOTOMY Right 05/30/2019   Procedure: FASCIOTOMY;  Surgeon: [name], MD;  Location: [facility];  Service: Vascular;  Laterality: Right;   FASCIOTOMY CLOSURE Right 06/04/2019   Procedure: FASCIOTOMY CLOSURE;  Surgeon:  Schnier, Dolores Lory, MD;  Location: [facility];  Service: Vascular;  Laterality: Right;   HEMORROIDECTOMY  2014   LOWER EXTREMITY ANGIOGRAPHY Right 05/30/2019   Procedure: LOWER EXTREMITY ANGIOGRAPHY;  Surgeon: [name], MD;  Location: [place] [facility];  Service: Cardiovascular;  Laterality: Right;   LOWER EXTREMITY ANGIOGRAPHY Right 07/02/2020   Procedure: LOWER EXTREMITY ANGIOGRAPHY;  Surgeon: [name], MD;  Location: [place] [facility];  Service: Cardiovascular;  Laterality: Right;   PERIPHERAL VASCULAR CATHETERIZATION N/A 02/09/2016   Procedure: Abdominal Aortogram w/Lower Extremity;  Surgeon: [name], MD;  Location: [place] [facility];  Service: Cardiovascular;  Laterality: N/A;   PERIPHERAL VASCULAR CATHETERIZATION Right 02/10/2016   Procedure: Lower Extremity Angiography;  Surgeon: [name], MD;  Location: [place] [facility];  Service: Cardiovascular;  Laterality: Right;   POLYPECTOMY  11/05/2017   Procedure: POLYPECTOMY;  Surgeon: Lucilla Lame, MD;  Location: [place];  Service: Endoscopy;;   SHOULDER ARTHROSCOPY WITH ROTATOR CUFF REPAIR AND SUBACROMIAL DECOMPRESSION Right 02/27/2020   Procedure: RIGHT SHOULDER ARTHROSCOPY SUBACROMIAL DECOMPRESSION, DISTAL CLAVICLE EXCISION AND MINI-OPEN ROTATOR CUFF REPAIR;  Surgeon: Thornton Park, MD;  Location: [facility];  Service: Orthopedics;  Laterality: Right;   TONSILLECTOMY     VASCULAR SURGERY  [id number]   Fem-Pop Bypass    FAMILY HISTORY: Family History  Problem Relation Age of Onset   CVA Mother    Heart attack Mother    Cancer Father        colon cancer   Colon cancer Father    Heart disease Maternal Uncle    Breast cancer Neg Hx     HEALTH MAINTENANCE: Social History   Tobacco Use   Smoking status: Former    Packs/day: 2.00    Years: 25.00    Total pack years: 50.00    Types: Cigarettes    Quit date: 08/22/1991    Years since quitting: 30.6   Smokeless tobacco: Never  Vaping Use   Vaping Use: Never used  Substance Use Topics   Alcohol use: No    Alcohol/week: 0.0 standard drinks of alcohol   Drug use: Never     Allergies  Allergen Reactions   Hydrocodone-Acetaminophen     Other reaction(s): Flushing Other reaction(s): Flushing   Amoxicillin Other (See Comments)    Yeast infection   Metoprolol Tartrate Rash   Oxycodone Itching   Vicodin [Hydrocodone-Acetaminophen] Hives and  Rash    Flushing    Current Outpatient Medications  Medication Sig Dispense Refill   alendronate (FOSAMAX) 10 MG tablet Take 10 mg by mouth daily before breakfast. Take with a full glass of water on an empty stomach.     apixaban (ELIQUIS) 5 MG TABS tablet Take 1 tablet (5 mg total) by mouth 2 (two) times daily. 60 tablet 11   atorvastatin (LIPITOR) 40 MG tablet Take 40 mg by mouth at bedtime.      calcium carbonate (OSCAL) 1500 (600 Ca) MG TABS tablet Take 1,200 mg of elemental calcium by mouth 2 (two) times daily with a meal.     cholecalciferol (VITAMIN D3) 25 MCG (1000 UNIT) tablet Take 1,000 Units by mouth daily.     digoxin (LANOXIN) 0.25 MG tablet Take by mouth.     DULoxetine (CYMBALTA) 60 MG capsule Take 60 mg by mouth 2 (two) times daily.     gabapentin (NEURONTIN) 800 MG tablet Take 1 tablet (800 mg total) by mouth 3 (three) times daily. 90 tablet  3   losartan (COZAAR) 25 MG tablet Take 25 mg by mouth every morning.      omeprazole (PRILOSEC) 20 MG capsule Take 20 mg by mouth daily.     Tocilizumab (ACTEMRA ACTPEN) 162 MG/0.9ML SOAJ Inject 162 mg into the skin once a week.      zolpidem (AMBIEN) 5 MG tablet Take 5 mg by mouth at bedtime as needed for sleep.      No current facility-administered medications for this visit.    OBJECTIVE: Vitals:   04/21/22 1058  BP: 126/75  Pulse: 87  Temp: 98.2 F (36.8 C)     Body mass index is 22.57 kg/m.      General: Well-developed, well-nourished, no acute distress. Eyes: Pink conjunctiva, anicteric sclera. HEENT: Normocephalic, moist mucous membranes, clear oropharnyx. Lungs: Clear to auscultation bilaterally. Heart: Regular rate and rhythm. No rubs, murmurs, or gallops. Abdomen: Soft, nontender, nondistended. No organomegaly noted, normoactive bowel sounds. Musculoskeletal: No edema, cyanosis, or clubbing. Neuro: Alert, answering all questions appropriately. Cranial nerves grossly intact. Skin: No rashes or petechiae  noted. Psych: Normal affect. Lymphatics: No cervical, calvicular, axillary or inguinal LAD.   LAB RESULTS:  Lab Results  Component Value Date   NA 136 06/24/2021   K 3.8 06/24/2021   CL 102 06/24/2021   CO2 26 06/24/2021   GLUCOSE 105 (H) 06/24/2021   BUN 15 06/24/2021   CREATININE 0.80 06/24/2021   CALCIUM 8.7 (L) 06/24/2021   PROT 6.5 06/24/2021   ALBUMIN 3.9 06/24/2021   AST 26 06/24/2021   ALT 23 06/24/2021   ALKPHOS 54 06/24/2021   BILITOT 0.4 06/24/2021   GFRNONAA >60 06/24/2021   GFRAA >60 02/18/2020    Lab Results  Component Value Date   WBC 5.3 06/24/2021   NEUTROABS 3.1 06/24/2021   HGB 13.7 06/24/2021   HCT 42.1 06/24/2021   MCV 97.5 06/24/2021   PLT 163 06/24/2021    No results found for: "TIBC", "FERRITIN", "IRONPCTSAT"   STUDIES: DG PAIN CLINIC C-ARM 1-60 MIN NO REPORT  Result Date: 03/22/2022 Fluoro was used, but no Radiologist interpretation will be provided. Please refer to "NOTES" tab for provider progress note.   ASSESSMENT AND PLAN:   Cassara Nida is a 71 y.o. female with pmh of PAD s/p right AKA, CAD, HTN, HLD, chronic back pain referred to Oncology for further work up of abnormal spine MRI.   # Abnormal MRI spine  - unclear etiology  - Patient reports history of chronic pain in the back specifically on the right lower side when she is doing any activity.  She had Xray of T spine and lumbar spine on 02/23/2022 which showed degenerative changes and chronic compression fracture of T11, L1 and L3.  Per patient, she had MRI of the spine done in August 2023 and was told that she has multiple spots in her spine indicative of cancer.   -I discussed with patient and her friend in detail that it is hard to diagnose cancer just from the imaging.  It can raise a suspicion for it however it needs further objective evidence to call it a cancer.   Except chronic pain in the back patient is asymptomatic.  An exam was unremarkable. We obtained disk of MRI  spine from the patient and will send to radiology department to put the images in our system and request for second opinion read.  Depending on the MRI, I will make a decision of ordering a PET scan.  I informed  the patient to expect a call sometime next week. For now, I will obtain blood work to rule out plasma cell dyscrasia.   Orders Placed This Encounter  Procedures   MR OUTSIDE FILMS SPINE   CBC with Differential   Kappa/lambda light chains   Comprehensive metabolic panel   Multiple Myeloma Panel (SPEP&IFE w/QIG)   Protein Electrophoresis, Urine Rflx.   Immunofixation, urine   Lactate dehydrogenase   RTC in 3 weeks for MD visit, discuss labs.  Patient expressed understanding and was in agreement with this plan. She also understands that She can call clinic at any time with any questions, concerns, or complaints.   I spent a total of 45 minutes reviewing chart data, face-to-face evaluation with the patient, counseling and coordination of care as detailed above.  Jane Canary, MD   04/21/2022 11:40 AM

## 2022-04-21 NOTE — Progress Notes (Signed)
Patient here today for initial visit.

## 2022-04-25 ENCOUNTER — Telehealth: Payer: Self-pay

## 2022-04-25 LAB — KAPPA/LAMBDA LIGHT CHAINS
Kappa free light chain: 18.7 mg/L (ref 3.3–19.4)
Kappa, lambda light chain ratio: 1.35 (ref 0.26–1.65)
Lambda free light chains: 13.9 mg/L (ref 5.7–26.3)

## 2022-04-25 LAB — IMMUNOFIXATION, URINE

## 2022-04-26 ENCOUNTER — Ambulatory Visit
Payer: Medicare Other | Attending: Student in an Organized Health Care Education/Training Program | Admitting: Student in an Organized Health Care Education/Training Program

## 2022-04-26 ENCOUNTER — Telehealth: Payer: Self-pay | Admitting: Internal Medicine

## 2022-04-26 DIAGNOSIS — M899 Disorder of bone, unspecified: Secondary | ICD-10-CM

## 2022-04-26 DIAGNOSIS — M12811 Other specific arthropathies, not elsewhere classified, right shoulder: Secondary | ICD-10-CM

## 2022-04-26 DIAGNOSIS — M47816 Spondylosis without myelopathy or radiculopathy, lumbar region: Secondary | ICD-10-CM | POA: Diagnosis not present

## 2022-04-26 DIAGNOSIS — G894 Chronic pain syndrome: Secondary | ICD-10-CM

## 2022-04-26 DIAGNOSIS — S22000S Wedge compression fracture of unspecified thoracic vertebra, sequela: Secondary | ICD-10-CM | POA: Diagnosis not present

## 2022-04-26 DIAGNOSIS — R937 Abnormal findings on diagnostic imaging of other parts of musculoskeletal system: Secondary | ICD-10-CM

## 2022-04-26 DIAGNOSIS — M545 Low back pain, unspecified: Secondary | ICD-10-CM

## 2022-04-26 DIAGNOSIS — I739 Peripheral vascular disease, unspecified: Secondary | ICD-10-CM

## 2022-04-26 LAB — MULTIPLE MYELOMA PANEL, SERUM
Albumin SerPl Elph-Mcnc: 3.8 g/dL (ref 2.9–4.4)
Albumin/Glob SerPl: 1.9 — ABNORMAL HIGH (ref 0.7–1.7)
Alpha 1: 0.2 g/dL (ref 0.0–0.4)
Alpha2 Glob SerPl Elph-Mcnc: 0.5 g/dL (ref 0.4–1.0)
B-Globulin SerPl Elph-Mcnc: 0.9 g/dL (ref 0.7–1.3)
Gamma Glob SerPl Elph-Mcnc: 0.5 g/dL (ref 0.4–1.8)
Globulin, Total: 2.1 g/dL — ABNORMAL LOW (ref 2.2–3.9)
IgA: 311 mg/dL (ref 87–352)
IgG (Immunoglobin G), Serum: 654 mg/dL (ref 586–1602)
IgM (Immunoglobulin M), Srm: 31 mg/dL (ref 26–217)
Total Protein ELP: 5.9 g/dL — ABNORMAL LOW (ref 6.0–8.5)

## 2022-04-26 NOTE — Progress Notes (Signed)
Patient: Sara [name]  Service Category: E/M  Provider: Gillis Santa, MD  DOB: 05-14-1951  DOS: 04/26/2022  Location: Office  MRN: [id number]  Setting: Ambulatory outpatient  Referring Provider: Danelle Berry, NP  Type: Established Patient  Specialty: Interventional Pain Management  PCP: Sara Berry, NP  Location: Remote location  Delivery: TeleHealth     Virtual Encounter - Pain Management PROVIDER NOTE: Information contained herein reflects review and annotations entered in association with encounter. Interpretation of such information and data should be left to medically-trained personnel. Information provided to patient can be located elsewhere in the medical record under "Patient Instructions". Document created using STT-dictation technology, any transcriptional errors that may result from process are unintentional.    Contact & Pharmacy Preferred: [phone] Home: [phone] (home) Mobile: [phone] (mobile) E-mail: [name]  [id number] [place] - [address] [id number]: [phone]: [phone]  Pre-screening  [name] offered "in-person" vs "virtual" encounter. She indicated preferring virtual for this encounter.   Reason COVID-19*  Social distancing based on CDC and AMA recommendations.   I contacted Sara Salkon 04/26/2022 via telephone.      I clearly identified myself as BGillis Santa MD. I verified that I was speaking with the correct person using two identifiers (Name: Sara [name] and date of birth: [birth year].  Consent I sought verbal advanced consent from Sara Salkfor virtual visit interactions. I informed Ms. JDovidioof possible security and privacy concerns, risks, and limitations associated with providing "not-in-person" medical evaluation and management services. I also informed [name] of the availability of "in-person" appointments. Finally, I  informed her that there would be a charge for the virtual visit and that she could be  personally, fully or partially, financially responsible for it. Ms. JCharlieexpressed understanding and agreed to proceed.   Historic Elements   Ms. Sara HollinsheadJPosais a 71y.o. year old, female patient evaluated today after our last contact on 03/22/2022. [name] has a past medical history of Arthritis, Depression, Dyspnea, GERD (gastroesophageal reflux disease), History of blood clots, Hyperlipidemia, Hypertension, Mild mitral regurgitation, Osteoporosis, Peripheral vascular disease ([place], Stomach ulcer, Vascular disease, Vertigo, and Wears dentures. She also  has a past surgical history that includes Vascular surgery (([id number]; Hemorroidectomy (2014); Esophagogastroduodenoscopy (N/A, 12/21/2014); Esophagogastroduodenoscopy (egd) with propofol (N/A, 02/08/2016); Cardiac catheterization (N/A, 02/09/2016); Cardiac catheterization (Right, 02/10/2016); Cardiac catheterization (02/15/2017); Colonoscopy with propofol (N/A, 10/22/2017); Colonoscopy with propofol (N/A, 11/05/2017); polypectomy (11/05/2017); Tonsillectomy; Adenoidectomy; Lower Extremity Angiography (Right, 05/30/2019); Fasciotomy closure (Right, 06/04/2019); Artery Biopsy (Right, 07/14/2019); Fasciotomy (Right, 05/30/2019); Breast cyst aspiration (Left); Shoulder arthroscopy with rotator cuff repair and subacromial decompression (Right, 02/27/2020); Lower Extremity Angiography (Right, 07/02/2020); and Amputation (Right, 08/11/2020). Ms. JWelbornhas a current medication list which includes the following prescription(s): alendronate, apixaban, atorvastatin, calcium carbonate, cholecalciferol, digoxin, duloxetine, gabapentin, losartan, omeprazole, actemra actpen, and zolpidem. She  reports that she quit smoking about 30 years ago. Her smoking use included cigarettes. She has a 50.00 pack-year smoking history. She has never used smokeless tobacco. She reports that she does  not drink alcohol and does not use drugs. Ms. JSebastianois allergic to hydrocodone-acetaminophen, amoxicillin, metoprolol tartrate, oxycodone, and vicodin [hydrocodone-acetaminophen].   HPI  Today, she is being contacted for follow-up evaluation   S/p thoracic MRI with [facility] with the following results 1. A chronic appearing compression fracture of the T11 vertebral body is present with 30%-35% loss of  anterior vertebral body height. 2. There is mild grade 1 anterolisthesis of T10 on T11 adjacent to the fracture with at least mild bilateral facet arthrosis and a pseudodisc of listhesis with resultant mild central canal stenosis with no associated neural foraminal stenosis. 3. No disc herniation or spinal stenosis is present at the other levels within the thoracic spine. 4. There are multiple small round scattered lesions within the thoracic spine with the largest measuring up to only approximately 46m in greatest diameter concerning for multiple small osseous metastases (or possibly multiple myeloma). A nuclear medicine bone scan could be considered for further evaluation. 5. Multilevel degenerative disc disease is present within the lower cervical spine demonstrated only at the superior margin of the sagittal images of this study which is not adequately evaluated on this MRI of the thoracic spine examination.  Has upcoming PET scan ordered by oncologist Inform [facility]  Certainly appreciate his help in assisting with rehabilitation, ability and strength preservation. Informed patient to follow-up for continued interventional treatment after PET scan no concern for malignancy.  Patient endorsed understanding.  Laboratory Chemistry Profile   Renal Lab Results  Component Value Date   BUN 9 04/21/2022   CREATININE 0.76 04/21/2022   BCR 13 09/21/2015   GFRAA >60 02/18/2020   GFRNONAA >60 04/21/2022    Hepatic Lab Results  Component Value Date    AST 32 04/21/2022   ALT 36 04/21/2022   ALBUMIN 4.0 04/21/2022   ALKPHOS 57 04/21/2022    Electrolytes Lab Results  Component Value Date   NA 136 04/21/2022   K 4.5 04/21/2022   CL 102 04/21/2022   CALCIUM 8.7 (L) 04/21/2022   MG 1.9 07/03/2020   PHOS 3.6 06/02/2019    Bone Lab Results  Component Value Date   VD25OH 45.8 09/21/2015    Inflammation (CRP: Acute Phase) (ESR: Chronic Phase) Lab Results  Component Value Date   ESRSEDRATE 58 (H) 07/03/2019   LATICACIDVEN 0.8 06/01/2019         Note: Above Lab results reviewed.  Imaging  MR OUTSIDE FILMS SPINE This examination belongs to an outside facility and is stored here for  comparison purposes only.  Contact the originating outside institution for  any associated report or interpretation.  Assessment  The primary encounter diagnosis was Lumbar facet arthropathy. Diagnoses of Lumbar pain, Compression fracture of thoracic vertebra, unspecified thoracic vertebral level, sequela, PVD (peripheral vascular disease) ([place], Rotator cuff arthropathy of right shoulder, Angiopathy, 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[place], and Chronic pain syndrome were also pertinent to this visit.  Plan of Care  Patient will contact me for continued interventional treatment after her PET scan and consultation with oncologist.   Follow-up plan:   No follow-ups on file.     1.  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  Discussed treatment options which include titration of gabapentin to a more therapeutic dose.  Can also consider trial of Lyrica in future.  2.  Consider lidocaine infusion for right lower extremity phantom limb pain.  QTc checked from EKG in November 2022 and is appropriate.  Will need updated EKG if we are considering lidocaine infusion.  3.  Consider spinal cord stimulation for phantom limb pain.  Informed patient that there is mixed evidence on this but this could be a treatment option.  4.  Right rotator cuff arthropathy and  dysfunction: Consider right suprascapular nerve block and possible RFA or suprascapular peripheral nerve stimulation.  Recent Visits Date Type Provider Dept  03/22/22 Procedure visit Sara Santa, MD [facility]  03/07/22 Office Visit Sara Santa, MD [facility]  02/16/22 Office Visit Sara Santa, MD [facility]  Showing recent visits within past 90 days and meeting all other requirements Today's Visits Date Type Provider Dept  04/26/22 Office Visit Sara Santa, MD [facility]  Showing today's visits and meeting all other requirements Future Appointments Date Type Provider Dept  06/08/22 Appointment Sara Santa, MD [facility]  Showing future appointments within next 90 days and meeting all other requirements  I discussed the assessment and treatment plan with the patient. The patient was provided an opportunity to ask questions and all were answered. The patient agreed with the plan and demonstrated an understanding of the instructions.  Patient advised to call back or seek an in-person evaluation if the symptoms or condition worsens.  Duration of encounter: 35mnutes.  Note by: BGillis Santa MD Date: 04/26/2022; Time: 1:51 PM

## 2022-04-26 NOTE — Telephone Encounter (Signed)
I was able to review the MRI T spine images. There are multiple small lesions in the thoracic spine.   Reviewed Radiology report also from careeverywhere.   MRI Thoracic Spine - 03/2022: 1. A chronic appearing compression fracture of the T11 vertebral body is present with 30%-35% loss of anterior vertebral body height. 2. There is mild grade 1 anterolisthesis of T10 on T11 adjacent to the fracture with at least mild bilateral facet arthrosis and a pseudodisc of listhesis with resultant mild central canal stenosis with no associated neural foraminal stenosis. 3. No disc herniation or spinal stenosis is present at the other levels within the thoracic spine. 4. There are multiple small round scattered lesions within the thoracic spine with the largest measuring up to only approximately 68m in greatest diameter concerning for multiple small osseous metastases (or possibly multiple myeloma). A nuclear medicine bone scan could be considered for further evaluation. 5. Multilevel degenerative disc disease is present within the lower cervical spine demonstrated only at the superior margin of the sagittal images of this study which is not adequately evaluated on this MRI of the thoracic spine examination.   I spoke with the patient over the phone to inform that I will order PET/CT scan for further evaluation and assess if anything can be more accessible for biopsy.

## 2022-04-30 NOTE — Progress Notes (Signed)
MRN : [id number]  Sara [name] is a 71 y.o. ([birth year]) female who presents with chief complaint of check circulation.  History of Present Illness:   The patient returns to the office for followup and review of the noninvasive studies.   There have been no interval changes in lower extremity symptoms. No interval shortening of the patient's claudication distance or development of rest pain symptoms. No new ulcers or wounds have occurred since the last visit.  The patient returns to the office for follow up of complaints of phantom pains involving the right lower extremity and the sensation that he she is still having excruciating pain in her right foot.  She actually feels that the discomfort/pain is better.  She saw Dr Holley Raring earlier today and he adjusted her gabapentin.  At the present time she is now taking 800 mg total of gabapentin tid.  She is also taking Cymbalta.   There have been no significant changes to the patient's overall health care.  The patient denies amaurosis fugax or recent TIA symptoms. There are no documented recent neurological changes noted. There is no history of DVT, PE or superficial thrombophlebitis. The patient denies recent episodes of angina or shortness of breath.   ABI Rt=AKA and Lt=1.20  (previous ABI's Rt=AKA and Lt=1.16)   No outpatient medications have been marked as taking for the 05/01/22 encounter (Appointment) with Delana Meyer, Dolores Lory, MD.    Past Medical History:  Diagnosis Date   Arthritis    right shoulder   Depression    Dyspnea    GERD (gastroesophageal reflux disease)    History of blood clots    Hyperlipidemia    Hypertension    Mild mitral regurgitation    Osteoporosis    Peripheral vascular disease ([place])    Stomach ulcer    Vascular disease    Sees Dr. Delana Meyer   Vertigo    Last episode approx Aug 2015   Wears dentures    full upper    Past Surgical History:  Procedure Laterality Date    ADENOIDECTOMY     AMPUTATION Right 08/11/2020   Procedure: AMPUTATION ABOVE KNEE;  Surgeon: [name], MD;  Location: [facility];  Service: Vascular;  Laterality: Right;   ARTERY BIOPSY Right 07/14/2019   Procedure: BIOPSY TEMPORAL ARTERY;  Surgeon: [name], MD;  Location: [facility];  Service: Vascular;  Laterality: Right;   BREAST CYST ASPIRATION Left    CARDIAC CATHETERIZATION  02/15/2017   [facility]   COLONOSCOPY WITH PROPOFOL N/A 10/22/2017   Procedure: COLONOSCOPY WITH PROPOFOL;  Surgeon: Lucilla Lame, MD;  Location: [place];  Service: Endoscopy;  Laterality: N/A;  specimens not taken--pt on Plavix will be brought back in after 7 days off med   COLONOSCOPY WITH PROPOFOL N/A 11/05/2017   Procedure: COLONOSCOPY WITH PROPOFOL;  Surgeon: Lucilla Lame, MD;  Location: [place];  Service: Endoscopy;  Laterality: N/A;   ESOPHAGOGASTRODUODENOSCOPY N/A 12/21/2014   Procedure: ESOPHAGOGASTRODUODENOSCOPY (EGD);  Surgeon: Lucilla Lame, MD;  Location: [place];  Service: Gastroenterology;  Laterality: N/A;   ESOPHAGOGASTRODUODENOSCOPY (EGD) WITH PROPOFOL N/A 02/08/2016   Procedure: ESOPHAGOGASTRODUODENOSCOPY (EGD) WITH PROPOFOL;  Surgeon: Lucilla Lame, MD;  Location: [facility];  Service: Endoscopy;  Laterality: N/A;   FASCIOTOMY Right 05/30/2019   Procedure: FASCIOTOMY;  Surgeon: [name], MD;  Location: [facility];  Service: Vascular;  Laterality: Right;   FASCIOTOMY  CLOSURE Right 06/04/2019   Procedure: FASCIOTOMY CLOSURE;  Surgeon: [name], MD;  Location: [facility];  Service: Vascular;  Laterality: Right;   HEMORROIDECTOMY  2014   LOWER EXTREMITY ANGIOGRAPHY Right 05/30/2019   Procedure: LOWER EXTREMITY ANGIOGRAPHY;  Surgeon: [name], MD;  Location: [place] [facility];  Service: Cardiovascular;  Laterality: Right;   LOWER EXTREMITY ANGIOGRAPHY Right 07/02/2020   Procedure: LOWER EXTREMITY ANGIOGRAPHY;  Surgeon: [name], MD;  Location: [place] [facility];  Service: Cardiovascular;  Laterality: Right;   PERIPHERAL VASCULAR CATHETERIZATION N/A 02/09/2016   Procedure: Abdominal Aortogram w/Lower Extremity;  Surgeon: [name], MD;  Location: [place] [facility];  Service: Cardiovascular;  Laterality: N/A;   PERIPHERAL VASCULAR CATHETERIZATION Right 02/10/2016   Procedure: Lower Extremity Angiography;  Surgeon: [name], MD;  Location: [place] [facility];  Service: Cardiovascular;  Laterality: Right;   POLYPECTOMY  11/05/2017   Procedure: POLYPECTOMY;  Surgeon: Lucilla Lame, MD;  Location: [place];  Service: Endoscopy;;   SHOULDER ARTHROSCOPY WITH ROTATOR CUFF REPAIR AND SUBACROMIAL DECOMPRESSION Right 02/27/2020   Procedure: RIGHT SHOULDER ARTHROSCOPY SUBACROMIAL DECOMPRESSION, DISTAL CLAVICLE EXCISION AND MINI-OPEN ROTATOR CUFF REPAIR;  Surgeon: Thornton Park, MD;  Location: [facility];  Service: Orthopedics;  Laterality: Right;   TONSILLECTOMY     VASCULAR SURGERY  [id number]   Fem-Pop Bypass    Social History Social History   Tobacco Use   Smoking status: Former    Packs/day: 2.00    Years: 25.00    Total pack years: 50.00    Types: Cigarettes    Quit date: 08/22/1991    Years since quitting: 30.7   Smokeless tobacco: Never  Vaping Use   Vaping Use: Never used  Substance Use Topics   Alcohol use: No    Alcohol/week: 0.0 standard drinks of alcohol   Drug use: Never    Family History Family History  Problem Relation Age of Onset   CVA Mother    Heart attack Mother    Cancer Father        colon cancer   Colon cancer Father    Heart disease Maternal Uncle    Breast cancer Neg Hx     Allergies  Allergen Reactions   Hydrocodone-Acetaminophen     Other reaction(s): Flushing Other reaction(s): Flushing   Amoxicillin Other (See Comments)    Yeast infection   Metoprolol Tartrate Rash   Oxycodone Itching   Vicodin [Hydrocodone-Acetaminophen] Hives and Rash     Flushing     REVIEW OF SYSTEMS (Negative unless checked)  Constitutional: '[]'$ Weight loss  '[]'$ Fever  '[]'$ Chills Cardiac: '[]'$ Chest pain   '[]'$ Chest pressure   '[]'$ Palpitations   '[]'$ Shortness of breath when laying flat   '[]'$ Shortness of breath with exertion. Vascular:  '[x]'$ Pain in legs with walking   '[]'$ Pain in legs at rest  '[]'$ History of DVT   '[]'$ Phlebitis   '[]'$ Swelling in legs   '[]'$ Varicose veins   '[]'$ Non-healing ulcers Pulmonary:   '[]'$ Uses home oxygen   '[]'$ Productive cough   '[]'$ Hemoptysis   '[]'$ Wheeze  '[]'$ COPD   '[]'$ Asthma Neurologic:  '[]'$ Dizziness   '[]'$ Seizures   '[]'$ History of stroke   '[]'$ History of TIA  '[]'$ Aphasia   '[]'$ Vissual changes   '[]'$ Weakness or numbness in arm   '[]'$ Weakness or numbness in leg Musculoskeletal:   '[]'$ Joint swelling   '[]'$ Joint pain   '[]'$ Low back pain Hematologic:  '[]'$ Easy bruising  '[]'$ Easy bleeding   '[]'$ Hypercoagulable state   '[]'$ Anemic Gastrointestinal:  '[]'$ Diarrhea   '[]'$   Vomiting  '[]'$ Gastroesophageal reflux/heartburn   '[]'$ Difficulty swallowing. Genitourinary:  '[]'$ Chronic kidney disease   '[]'$ Difficult urination  '[]'$ Frequent urination   '[]'$ Blood in urine Skin:  '[]'$ Rashes   '[]'$ Ulcers  Psychological:  '[]'$ History of anxiety   '[]'$  History of major depression.  Physical Examination  There were no vitals filed for this visit. There is no height or weight on file to calculate BMI. Gen: WD/WN, NAD Head: [place]/AT, No temporalis wasting.  Ear/Nose/Throat: Hearing grossly intact, nares w/o erythema or drainage Eyes: PER, EOMI, sclera nonicteric.  Neck: Supple, no masses.  No bruit or JVD.  Pulmonary:  Good air movement, no audible wheezing, no use of accessory muscles.  Cardiac: RRR, normal S1, S2, no Murmurs. Vascular:  mild trophic changes, no open wounds Vessel Right Left  Radial Palpable Palpable  PT AKA Palpable  DP AKA Not Palpable  Gastrointestinal: soft, non-distended. No guarding/no peritoneal signs.  Musculoskeletal: M/S 5/5 throughout.  No visible deformity.  Neurologic: CN 2-12 intact. Pain and light touch intact  in extremities.  Symmetrical.  Speech is fluent. Motor exam as listed above. Psychiatric: Judgment intact, Mood & affect appropriate for pt's clinical situation. Dermatologic: No rashes or ulcers noted.  No changes consistent with cellulitis.   CBC Lab Results  Component Value Date   WBC 6.9 04/21/2022   HGB 14.3 04/21/2022   HCT 44.3 04/21/2022   MCV 96.9 04/21/2022   PLT 182 04/21/2022    BMET    Component Value Date/Time   NA 136 04/21/2022 1143   NA 139 09/21/2015 0916   K 4.5 04/21/2022 1143   CL 102 04/21/2022 1143   CO2 27 04/21/2022 1143   GLUCOSE 103 (H) 04/21/2022 1143   BUN 9 04/21/2022 1143   BUN 12 09/21/2015 0916   CREATININE 0.76 04/21/2022 1143   CREATININE 0.88 05/16/2016 1034   CALCIUM 8.7 (L) 04/21/2022 1143   GFRNONAA >60 04/21/2022 1143   GFRNONAA 66 05/01/2016 0846   GFRAA >60 02/18/2020 0949   GFRAA 76 05/01/2016 0846   Estimated Creatinine Clearance: 54.9 mL/min (by C-G formula based on SCr of 0.76 mg/dL).  COAG Lab Results  Component Value Date   INR 1.0 08/09/2020   INR 1.3 (H) 02/18/2020   INR 1.0 07/11/2019    Radiology MR OUTSIDE FILMS SPINE  Result Date: 04/21/2022 This examination belongs to an outside facility and is stored here for comparison purposes only.  Contact the originating outside institution for any associated report or interpretation.    Assessment/Plan 1. Atherosclerosis of native artery of right lower extremity with rest pain ([place])  Recommend:  The patient has evidence of atherosclerosis of the left lower extremity with claudication.  The patient does not voice lifestyle limiting changes at this point in time.  Noninvasive studies do not suggest clinically significant change.  No invasive studies, angiography or surgery at this time The patient should continue walking and begin a more formal exercise program.  The patient should continue antiplatelet therapy and aggressive treatment of the lipid  abnormalities  No changes in the patient's medications at this time  Continued surveillance is indicated as atherosclerosis is likely to progress with time.    The patient will continue follow up with noninvasive studies as ordered.   - VAS [place] ABI WITH/WO TBI  2. Bilateral carotid artery stenosis Recommend:  Given the patient's asymptomatic subcritical stenosis no further invasive testing or surgery at this time.  Duplex ultrasound shows 1-39% stenosis bilaterally.  Continue antiplatelet therapy as prescribed Continue  management of CAD, HTN and Hyperlipidemia Healthy heart diet,  encouraged exercise at least 4 times per week Follow up in 24 months with duplex ultrasound and physical exam    3. Coronary artery disease involving native coronary artery of native heart with angina pectoris ([place]) Continue cardiac and antihypertensive medications as already ordered and reviewed, no changes at this time.  Continue statin as ordered and reviewed, no changes at this time  Nitrates PRN for chest pain   4. Essential hypertension Continue antihypertensive medications as already ordered, these medications have been reviewed and there are no changes at this time.   5. S/P AKA (above knee amputation), right [facility]) Continue with Dr Holley Raring for Pain Management    [name], MD  04/30/2022 8:58 PM

## 2022-05-01 ENCOUNTER — Ambulatory Visit (INDEPENDENT_AMBULATORY_CARE_PROVIDER_SITE_OTHER): Payer: Medicare Other | Admitting: Vascular Surgery

## 2022-05-01 ENCOUNTER — Ambulatory Visit (INDEPENDENT_AMBULATORY_CARE_PROVIDER_SITE_OTHER): Payer: Medicare Other

## 2022-05-01 ENCOUNTER — Encounter (INDEPENDENT_AMBULATORY_CARE_PROVIDER_SITE_OTHER): Payer: Self-pay | Admitting: Vascular Surgery

## 2022-05-01 VITALS — BP 105/58 | HR 88 | Resp 17

## 2022-05-01 DIAGNOSIS — I6523 Occlusion and stenosis of bilateral carotid arteries: Secondary | ICD-10-CM

## 2022-05-01 DIAGNOSIS — I70221 Atherosclerosis of native arteries of extremities with rest pain, right leg: Secondary | ICD-10-CM | POA: Diagnosis not present

## 2022-05-01 DIAGNOSIS — I779 Disorder of arteries and arterioles, unspecified: Secondary | ICD-10-CM | POA: Diagnosis not present

## 2022-05-01 DIAGNOSIS — I10 Essential (primary) hypertension: Secondary | ICD-10-CM

## 2022-05-01 DIAGNOSIS — Z89611 Acquired absence of right leg above knee: Secondary | ICD-10-CM

## 2022-05-01 DIAGNOSIS — I25119 Atherosclerotic heart disease of native coronary artery with unspecified angina pectoris: Secondary | ICD-10-CM

## 2022-05-04 ENCOUNTER — Ambulatory Visit: Payer: Medicare Other | Attending: Orthopedic Surgery

## 2022-05-04 DIAGNOSIS — M5459 Other low back pain: Secondary | ICD-10-CM | POA: Diagnosis not present

## 2022-05-04 DIAGNOSIS — M25511 Pain in right shoulder: Secondary | ICD-10-CM | POA: Diagnosis not present

## 2022-05-04 DIAGNOSIS — G8929 Other chronic pain: Secondary | ICD-10-CM | POA: Insufficient documentation

## 2022-05-04 DIAGNOSIS — M6281 Muscle weakness (generalized): Secondary | ICD-10-CM | POA: Insufficient documentation

## 2022-05-04 NOTE — Therapy (Signed)
OUTPATIENT PHYSICAL THERAPY SHOULDER/BACK TREATMENT  Patient Name: Sara [name] MRN: [id number] DOB:12-07-1950, 71 y.o., female Today's Date: 05/04/2022   PT End of Session - 05/04/22 1639     Visit Number 16    Number of Visits 25    Date for PT Re-Evaluation 05/09/22    Authorization Type eval: 02/14/22    PT Start Time 1400    PT Stop Time 1445    PT Time Calculation (min) 45 min    Activity Tolerance Patient tolerated treatment well    Behavior During Therapy [facility] for tasks assessed/performed              Past Medical History:  Diagnosis Date   Arthritis    right shoulder   Depression    Dyspnea    GERD (gastroesophageal reflux disease)    History of blood clots    Hyperlipidemia    Hypertension    Mild mitral regurgitation    Osteoporosis    Peripheral vascular disease ([place])    Stomach ulcer    Vascular disease    Sees Dr. Delana Meyer   Vertigo    Last episode approx Aug 2015   Wears dentures    full upper   Past Surgical History:  Procedure Laterality Date   ADENOIDECTOMY     AMPUTATION Right 08/11/2020   Procedure: AMPUTATION ABOVE KNEE;  Surgeon: [name], MD;  Location: [facility];  Service: Vascular;  Laterality: Right;   ARTERY BIOPSY Right 07/14/2019   Procedure: BIOPSY TEMPORAL ARTERY;  Surgeon: [name], MD;  Location: [facility];  Service: Vascular;  Laterality: Right;   BREAST CYST ASPIRATION Left    CARDIAC CATHETERIZATION  02/15/2017   [facility]   COLONOSCOPY WITH PROPOFOL N/A 10/22/2017   Procedure: COLONOSCOPY WITH PROPOFOL;  Surgeon: Lucilla Lame, MD;  Location: [place];  Service: Endoscopy;  Laterality: N/A;  specimens not taken--pt on Plavix will be brought back in after 7 days off med   COLONOSCOPY WITH PROPOFOL N/A 11/05/2017   Procedure: COLONOSCOPY WITH PROPOFOL;  Surgeon: Lucilla Lame, MD;  Location: [place];  Service: Endoscopy;  Laterality: N/A;   ESOPHAGOGASTRODUODENOSCOPY N/A 12/21/2014    Procedure: ESOPHAGOGASTRODUODENOSCOPY (EGD);  Surgeon: Lucilla Lame, MD;  Location: [place];  Service: Gastroenterology;  Laterality: N/A;   ESOPHAGOGASTRODUODENOSCOPY (EGD) WITH PROPOFOL N/A 02/08/2016   Procedure: ESOPHAGOGASTRODUODENOSCOPY (EGD) WITH PROPOFOL;  Surgeon: Lucilla Lame, MD;  Location: [facility];  Service: Endoscopy;  Laterality: N/A;   FASCIOTOMY Right 05/30/2019   Procedure: FASCIOTOMY;  Surgeon: [name], MD;  Location: [facility];  Service: Vascular;  Laterality: Right;   FASCIOTOMY CLOSURE Right 06/04/2019   Procedure: FASCIOTOMY CLOSURE;  Surgeon: [name], MD;  Location: [facility];  Service: Vascular;  Laterality: Right;   HEMORROIDECTOMY  2014   LOWER EXTREMITY ANGIOGRAPHY Right 05/30/2019   Procedure: LOWER EXTREMITY ANGIOGRAPHY;  Surgeon: [name], MD;  Location: [place] [facility];  Service: Cardiovascular;  Laterality: Right;   LOWER EXTREMITY ANGIOGRAPHY Right 07/02/2020   Procedure: LOWER EXTREMITY ANGIOGRAPHY;  Surgeon: [name], MD;  Location: [place] [facility];  Service: Cardiovascular;  Laterality: Right;   PERIPHERAL VASCULAR CATHETERIZATION N/A 02/09/2016   Procedure: Abdominal Aortogram w/Lower Extremity;  Surgeon: [name], MD;  Location: [place] [facility];  Service: Cardiovascular;  Laterality: N/A;   PERIPHERAL VASCULAR CATHETERIZATION Right 02/10/2016   Procedure: Lower Extremity Angiography;  Surgeon: [name], MD;  Location: [facility]  [facility];  Service: Cardiovascular;  Laterality: Right;   POLYPECTOMY  11/05/2017   Procedure: POLYPECTOMY;  Surgeon: Lucilla Lame, MD;  Location: [place];  Service: Endoscopy;;   SHOULDER ARTHROSCOPY WITH ROTATOR CUFF REPAIR AND SUBACROMIAL DECOMPRESSION Right 02/27/2020   Procedure: RIGHT SHOULDER ARTHROSCOPY SUBACROMIAL DECOMPRESSION, DISTAL CLAVICLE EXCISION AND MINI-OPEN ROTATOR CUFF REPAIR;  Surgeon: Thornton Park, MD;  Location:  [facility];  Service: Orthopedics;  Laterality: Right;   TONSILLECTOMY     VASCULAR SURGERY  [id number]   Fem-Pop Bypass   Patient Active Problem List   Diagnosis Date Noted   Degeneration of lumbar intervertebral disc 01/04/2022   Shoulder pain 01/04/2022   Pain in joint of right shoulder 01/04/2022   Spasm of back muscles 01/04/2022   Thoracic back pain 01/04/2022   History of left above knee amputation [facility]) (Dec 2021) 12/27/2021   Rotator cuff arthropathy of right shoulder 12/27/2021   Hx of rotator cuff surgery 12/27/2021   Chronic pain syndrome 12/27/2021   Adhesive capsulitis of right shoulder 12/05/2021   Phantom pain ([place]) 11/10/2021   Muscle spasm 09/09/2020   S/P AKA (above knee amputation), right ([place]) 09/08/2020   Ischemia of extremity 07/02/2020   Chronic pain in right shoulder 11/25/2019   Encounter for long-term (current) use of high-risk medication 07/16/2019   GCA (giant cell arteritis) ([place]) 07/16/2019   Temporal arteritis ([place]) 07/13/2019   Postoperative wound infection 06/23/2019   Ischemic leg 05/31/2019   Atherosclerosis of native arteries of extremity with intermittent claudication ([place]) 05/30/2019   Depression 05/29/2019   Eczema of lower extremity 03/04/2018   Chronic venous insufficiency 03/02/2018   Personal history of colonic polyps    Family history of colonic polyps    Benign neoplasm of ascending colon    Mitral valve insufficiency 02/08/2017   Dyspnea on exertion 02/07/2017   Non-rheumatic mitral regurgitation 02/07/2017   Precordial pain 02/07/2017   CAD (coronary artery disease) 12/21/2016   Essential hypertension 12/21/2016   Compression fracture of lumbar spine, non-traumatic ([place]) 04/26/2016   Compression fracture of L3 lumbar vertebra 04/20/2016   PVD (peripheral vascular disease) ([place]) 02/24/2016   Family history of premature CAD 02/24/2016   Gastritis    Other specified diseases of esophagus    Hiatal hernia    Gastritis and  gastroduodenitis    Ischemia of lower extremity    Arterial occlusion ([place])    Atherosclerosis of aorta ([place])    History of smoking    Hyperlipidemia    Pain in the chest    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 02/05/2016   Carotid artery stenosis 01/04/2016   Vertigo, benign paroxysmal 12/28/2015   Clinical depression 09/06/2015   History of alcoholism ([place]) 09/06/2015   Angiopathy, 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[place]) 09/06/2015   Atherosclerosis of native arteries of extremity with rest pain ([place]) 09/06/2015   Acute non-recurrent maxillary sinusitis 07/14/2015   Vaginal pruritus 05/26/2015   Chronic recurrent major depressive disorder ([place]) 03/09/2015   Osteoporosis, post-menopausal 03/09/2015   Peripheral blood vessel disorder ([place]) 03/09/2015   Acid reflux 03/09/2015   GERD (gastroesophageal reflux disease) 12/21/2014   Carotid artery narrowing 01/28/2014   Peripheral arterial occlusive disease ([place]) 06/08/2011   Occlusion and stenosis of unspecified carotid artery 06/08/2011   PAD (peripheral artery disease) ([place]) 06/08/2011    PCP: Danelle Berry, NP  REFERRING PROVIDER: Barrie Lyme MD  REFERRING DIAGNOSIS: S29.012A (ICD-10-CM) - Strain of muscle and tendon of back wall of thorax, initial encounter  THERAPY DIAG: Muscle weakness (generalized)  Other low back pain  Chronic right shoulder pain  RATIONALE FOR EVALUATION AND TREATMENT: Rehabilitation  ONSET DATE: 08/11/20 (approximate)  FROM INITIAL EVALUATION  SUBJECTIVE:                                                                                                                                                                                         Chief Complaint: R thoracic/rib pain  Pertinent History Pt reports she she started experiencing R shoulder pain which started shortly after her RLE amputation (08/11/20). She attributes the pain to having to self-propel a wheelchair for an extended  period of time. She has seen Dr. Mack Guise at [facility] in the past and underwent a R shoulder mini-open rotator cuff repair, DCE, and SAD on 02/27/20. Since the pain started she has undergone R shoulder injections without improvement in her pain. When pt describes her pain she points to the area below her R shoulder blade around T10/T11. She denies weakness in the RUE or any pain in the area of the [facility] joint or shoulder pain. Denies N/T in back or RUE. Pain worsens with extended use of RUE especially when cleaning the counters in her apartment or sweeping the floor. She has a history of L1 and L3 compression fractures in 2017. She currently sees pain management for her R shoulder pain and takes oxycodone to manage the pain. She also struggles with chronic RLE phantom pain s/p amputation and takes gabapentin for this issue. She has a history of multiple vascular surgeries prior to her amputation and also has a history of carpal tunnel syndrome.  Pain:  Pain Intensity: Not rated today Pain location: R back/flank inferior to the shoulder blade Pain Quality: sharp  Radiating: No  Numbness/Tingling: No Focal Weakness: No Aggravating factors: Excessive use of RUE, mopping the floors, wiping countertops, no pain with driving, Relieving factors: heating pad, never tried lidocaine patch, oxycodone, gabapentin doesn't help with R flank/back pain; 24-hour pain behavior: varies depending on activity History of prior shoulder or neck/shoulder injury, pain, surgery, or therapy: Yes Falls: Has patient fallen in last 6 months? 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  Recent memory is intact.  Remote memory is intact.  Attention span and concentration are intact.  Expressive speech is intact.  Patient's fund of knowledge is within normal limits for educational level.    Gross Musculoskeletal Assessment Tremor: None Bulk: Normal Tone: Normal  Gait Pt ambulates with RLE prosthesis and lofstrand in LUE, decreased self-selected gait speed;  Posture Increased thoracic kyphosis, forward head, and rounded shoulder in sitting and standing;  Cervical Screen AROM: WFL and painless with overpressure in all planes Spurlings A (ipsilateral lateral flexion/axial compression): R: Negative L: Negative Spurlings B (ipsilateral lateral flexion/contralateral rotation/axial compression): R: Negative L: Negative Repeated movement: Not examined Hoffman Sign (cervical cord compression): R: Not examined L: Not examined ULTT Median: R: Not examined L: Not examined ULTT Ulnar: R: Not examined L: Not examined ULTT Radial: R: Not examined L: Not examined   AROM Cervical AROM is painless in all directions and WFL although not full. No reproduction of pain with AROM R shoulder flexion and abduction which is also limited but WFL. No gross deficits noted. Pt denies pain with thoracic flexion, extension, rotation, and lateral flexion.    LE MMT:  MMT (out of 5) Right 05/04/2022 Left 05/04/2022  Cervical (isometric)  Flexion WNL  Extension WNL  Lateral Flexion WNL WNL  Rotation WNL WNL      Shoulder   Flexion 4+ 4+  Extension    Abduction 4+ 4+   External rotation (seated) 4+ 4+  Internal rotation (seated) 4+ 4+  Horizontal abduction    Horizontal adduction    Lower Trapezius    Rhomboids        Elbow  Flexion 5 5  Extension 5 5  Pronation    Supination        Wrist  Flexion 5 5  Extension 5 5  Radial deviation    Ulnar deviation        MCP  Flexion    Extension    Abduction 5 5  Adduction 5 5  (* = pain; Blank rows = not tested)  Sensation Grossly intact to light touch bilateral UE as determined by testing dermatomes C2-T2. Proprioception and hot/cold testing deferred on this date.  Reflexes Deferred  Palpation Pt is nontender to palpation around entire R shoulder girdle anterior, lateral, and posterior. No pain with palpation to R cervical paraspinals, upper trap, rhomboids, or mid trap. She has pain to palpation along spinous processes from T9-L3 as well as along paraspinals in this region on the right.  Repeated Movements Deferred  Passive Accessory Intervertebral Motion Pt reports reproduction of pain with CPA and bilateral UPA T9-L3. Unable to fully assess mobility secondary to pain however appear grossly hypomobile with notable thoracic kyphosis when prone.  SPECIAL TESTS Rotator Cuff  Drop Arm Test: Negative Painful Arc (Pain from 60 to 120 degrees scaption): Negative Infraspinatus Muscle Test: Negative  Subacromial Impingement Hawkins-Kennedy: Negative Neer (Block scapula, PROM flexion): Negative Painful Arc (Pain from 60 to 120 degrees scaption): Negative Empty Can: Negative External Rotation Resistance: Negative Horizontal Adduction: Not examined Scapular Assist: Not examined  Labral Tear Biceps Load II (120 elevation, full ER, 90 elbow flexion, full supination, resisted elbow flexion): Not examined Crank (160 scaption, axial load with IR/ER): Not examined Active Compression Test: Not examined  Bicep  Tendon Pathology Speed (shoulder flexion to 90, external rotation, full elbow  extension, and forearm supination with resistance: Not examined Yergason's (resisted shoulder ER and supination/biceps tendon pathology): Not examined  Shoulder Instability Sulcus Sign: Not examined Anterior Apprehension: Not examined  Beighton scale: Deferred  Outcome Measures Quick DASH: Deferred FOTO: Pt completed shoulder FOTO but this appears to be more related to back pain. Will update at next therapy session.    TODAY'S TREATMENT   SUBJECTIVE: Pt reports 8/10 back/"R shoulder" pain upon arrival today. She saw vascular who was pleased with her LLE blood flow per patient. No specific questions or concerns currently.    PAIN: 8/10 back/"R shoulder" pain   Ther-ex: Prone alternating hip extension 2 x 10 BLE; L sidelying R hip abduction with manual resistance 2 x 10; L sidelying L hip adduction with manual resistance 2 x 10; Supine SLR with manual resistance 2 x 10 BLE; Supine bolster knee bridges 2 x 10; R sidelying L hip abduction with manual resistance 2 x 10; R sidelying R hip adduction with manual resistance 2 x 10;   Not performed: Supine R shoulder flexion from neutral to 90 with 2# dumbbell (DB) x 10, 3# DB x 10; Supine R shoulder serratus punch at 90 flexion with 2# DB x 10, 3# DB x 10; Supine R shoulder circles at 90 flexion with 2# DB x 10 each direction, 3# DB x 10 each direction; L sidelying R shoulder ER with 3# DB 2 x 10; L sidleying R shoulder abduction with 3# DB 2 x 10; Seated dynamic hug with blue tband 2 x 10; Seated pallof press with green tband x 10 toward each side; Seated scapular retractions 2 x 10; Prone R shoulder extension with 3# dumbbell (DB) 2 x 10; Prone R low row with 3# DB 2 x 10; Prone R horizontal abduction with 3# DB 2 x 10; Prone R low trap flexion (Y) with 3# DB 2 x 10; Prone R shoulder ER with 3# DB 2 x 10; Seated PVC chest press with manual resistance 2 x 10; Seated PVC row with manual resistance 2 x 10; Seated shoulder  flexion with 3# dumbbells 2 x 10; Seated shoulder scaption with 3# dumbbells 2 x 10; Seated overhead shoulder press with 3# dumbbells x 10, x 5, discontinued second set due to increase in thoracic/R flank pain;    PATIENT EDUCATION:  Education details: Pt educated throughout session about proper posture and technique with exercises. Improved exercise technique, movement at target joints, use of target muscles after min to mod verbal, visual, tactile cues. Person educated: Patient Education method: Explanation, verbal cues, tactile cues, and demonstration Education comprehension: verbalized understanding and returned demonstration;   HOME EXERCISE PROGRAM: None currently   ASSESSMENT:  CLINICAL IMPRESSION: Continued with back/hip/core strengthening during session today. She has experienced considerable improvement in her pain since starting with therapy however she still has notable pain. Pt encouraged to follow-up as scheduled with therapy until additional plan is in place with oncology. Frequency will be maintained at 1x/wk. She will benefit from PT services to address deficits in strength and pain in order to return to full function at home with less pain.    REHAB POTENTIAL: Fair    CLINICAL DECISION MAKING: Unstable/unpredictable  EVALUATION COMPLEXITY: High   GOALS: Goals reviewed with patient? Yes  SHORT TERM GOALS: Target date:  03/14/2022  Pt will be independent with HEP to improve strength and decrease back pain to improve pain-free function at  home. Baseline:  Goal status: INITIAL   LONG TERM GOALS: Target date: 04/11/2022  Pt will increase FOTO to at least 57 in order to demonstrate significant improvement in function at home and work related to back pain  Baseline: 02/14/22: To be completed at next visit; 02/22/22: 45; 03/29/22: Deferred Goal status: INITIAL  2.  Pt will decrease worst back pain by at least 2 points on the NPRS in order to demonstrate clinically  significant reduction in back pain. Baseline: 02/14/22: To be completed at next visit; 03/29/22: 10/10; Goal status: INITIAL  3.  Pt will report at least 50% improvement in her back symptoms in order to demonstrate clinically significant reduction in disability related to back pain;        Baseline: 03/29/22: 80% improvement since starting with therapy; Goal status: INITIAL   PLAN: PT FREQUENCY: 1-2x/week  PT DURATION: 8 weeks  PLANNED INTERVENTIONS: Therapeutic exercises, Therapeutic activity, Neuromuscular re-education, Balance training, Gait training, Patient/Family education, Joint manipulation, Joint mobilization, Vestibular training, Canalith repositioning, Aquatic Therapy, Dry Needling, Electrical stimulation, Spinal manipulation, Spinal mobilization, Cryotherapy, Moist heat, Traction, Ultrasound, Ionotophoresis '4mg'$ /ml Dexamethasone, and Manual therapy  PLAN FOR NEXT SESSION: Continue manual techniques for R sided back pain, continue stretching/strengthening;    Phillips Grout PT, DPT, GCS  Physical Therapist- [facility]  05/04/2022, 4:45 PM

## 2022-05-06 NOTE — Therapy (Signed)
OUTPATIENT PHYSICAL THERAPY SHOULDER/BACK TREATMENT/RECERTIFICATION  Patient Name: Sara [name] MRN: [id number] DOB:1951/07/11, 71 y.o., female Today's Date: 05/06/2022      Past Medical History:  Diagnosis Date   Arthritis    right shoulder   Depression    Dyspnea    GERD (gastroesophageal reflux disease)    History of blood clots    Hyperlipidemia    Hypertension    Mild mitral regurgitation    Osteoporosis    Peripheral vascular disease ([place])    Stomach ulcer    Vascular disease    Sees Dr. Delana Meyer   Vertigo    Last episode approx Aug 2015   Wears dentures    full upper   Past Surgical History:  Procedure Laterality Date   ADENOIDECTOMY     AMPUTATION Right 08/11/2020   Procedure: AMPUTATION ABOVE KNEE;  Surgeon: [name], MD;  Location: [facility];  Service: Vascular;  Laterality: Right;   ARTERY BIOPSY Right 07/14/2019   Procedure: BIOPSY TEMPORAL ARTERY;  Surgeon: [name], MD;  Location: [facility];  Service: Vascular;  Laterality: Right;   BREAST CYST ASPIRATION Left    CARDIAC CATHETERIZATION  02/15/2017   [facility]   COLONOSCOPY WITH PROPOFOL N/A 10/22/2017   Procedure: COLONOSCOPY WITH PROPOFOL;  Surgeon: Lucilla Lame, MD;  Location: [place];  Service: Endoscopy;  Laterality: N/A;  specimens not taken--pt on Plavix will be brought back in after 7 days off med   COLONOSCOPY WITH PROPOFOL N/A 11/05/2017   Procedure: COLONOSCOPY WITH PROPOFOL;  Surgeon: Lucilla Lame, MD;  Location: [place];  Service: Endoscopy;  Laterality: N/A;   ESOPHAGOGASTRODUODENOSCOPY N/A 12/21/2014   Procedure: ESOPHAGOGASTRODUODENOSCOPY (EGD);  Surgeon: Lucilla Lame, MD;  Location: [place];  Service: Gastroenterology;  Laterality: N/A;   ESOPHAGOGASTRODUODENOSCOPY (EGD) WITH PROPOFOL N/A 02/08/2016   Procedure: ESOPHAGOGASTRODUODENOSCOPY (EGD) WITH PROPOFOL;  Surgeon: Lucilla Lame, MD;  Location: [facility];  Service: Endoscopy;   Laterality: N/A;   FASCIOTOMY Right 05/30/2019   Procedure: FASCIOTOMY;  Surgeon: [name], MD;  Location: [facility];  Service: Vascular;  Laterality: Right;   FASCIOTOMY CLOSURE Right 06/04/2019   Procedure: FASCIOTOMY CLOSURE;  Surgeon: [name], MD;  Location: [facility];  Service: Vascular;  Laterality: Right;   HEMORROIDECTOMY  2014   LOWER EXTREMITY ANGIOGRAPHY Right 05/30/2019   Procedure: LOWER EXTREMITY ANGIOGRAPHY;  Surgeon: [name], MD;  Location: [facility];  Service: Cardiovascular;  Laterality: Right;   LOWER EXTREMITY ANGIOGRAPHY Right 07/02/2020   Procedure: LOWER EXTREMITY ANGIOGRAPHY;  Surgeon: [name], MD;  Location: [place] [facility];  Service: Cardiovascular;  Laterality: Right;   PERIPHERAL VASCULAR CATHETERIZATION N/A 02/09/2016   Procedure: Abdominal Aortogram w/Lower Extremity;  Surgeon: [name], MD;  Location: [place] [facility];  Service: Cardiovascular;  Laterality: N/A;   PERIPHERAL VASCULAR CATHETERIZATION Right 02/10/2016   Procedure: Lower Extremity Angiography;  Surgeon: [name], MD;  Location: [place] [facility];  Service: Cardiovascular;  Laterality: Right;   POLYPECTOMY  11/05/2017   Procedure: POLYPECTOMY;  Surgeon: Lucilla Lame, MD;  Location: [place];  Service: Endoscopy;;   SHOULDER ARTHROSCOPY WITH ROTATOR CUFF REPAIR AND SUBACROMIAL DECOMPRESSION Right 02/27/2020   Procedure: RIGHT SHOULDER ARTHROSCOPY SUBACROMIAL DECOMPRESSION, DISTAL CLAVICLE EXCISION AND MINI-OPEN ROTATOR CUFF REPAIR;  Surgeon: [place], MD;  Location: [facility];  Service: Orthopedics;  Laterality: Right;   TONSILLECTOMY     VASCULAR SURGERY  [id number]   Fem-Pop Bypass  Patient Active Problem List   Diagnosis Date Noted   Degeneration of lumbar intervertebral disc 01/04/2022   Shoulder pain 01/04/2022   Pain in joint of right shoulder 01/04/2022   Spasm of back muscles 01/04/2022   Thoracic  back pain 01/04/2022   History of left above knee amputation [facility]) (Dec 2021) 12/27/2021   Rotator cuff arthropathy of right shoulder 12/27/2021   Hx of rotator cuff surgery 12/27/2021   Chronic pain syndrome 12/27/2021   Adhesive capsulitis of right shoulder 12/05/2021   Phantom pain ([place]) 11/10/2021   Muscle spasm 09/09/2020   S/P AKA (above knee amputation), right ([place]) 09/08/2020   Ischemia of extremity 07/02/2020   Chronic pain in right shoulder 11/25/2019   Encounter for long-term (current) use of high-risk medication 07/16/2019   GCA (giant cell arteritis) ([place]) 07/16/2019   Temporal arteritis ([place]) 07/13/2019   Postoperative wound infection 06/23/2019   Ischemic leg 05/31/2019   Atherosclerosis of native arteries of extremity with intermittent claudication ([place]) 05/30/2019   Depression 05/29/2019   Eczema of lower extremity 03/04/2018   Chronic venous insufficiency 03/02/2018   Personal history of colonic polyps    Family history of colonic polyps    Benign neoplasm of ascending colon    Mitral valve insufficiency 02/08/2017   Dyspnea on exertion 02/07/2017   Non-rheumatic mitral regurgitation 02/07/2017   Precordial pain 02/07/2017   CAD (coronary artery disease) 12/21/2016   Essential hypertension 12/21/2016   Compression fracture of lumbar spine, non-traumatic ([place]) 04/26/2016   Compression fracture of L3 lumbar vertebra 04/20/2016   PVD (peripheral vascular disease) ([place]) 02/24/2016   Family history of premature CAD 02/24/2016   Gastritis    Other specified diseases of esophagus    Hiatal hernia    Gastritis and gastroduodenitis    Ischemia of lower extremity    Arterial occlusion ([place])    Atherosclerosis of aorta ([place])    History of smoking    Hyperlipidemia    Pain in the chest    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 02/05/2016   Carotid artery stenosis 01/04/2016   Vertigo, benign paroxysmal 12/28/2015   Clinical depression  09/06/2015   History of alcoholism ([place]) 09/06/2015   Angiopathy, 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[place]) 09/06/2015   Atherosclerosis of native arteries of extremity with rest pain ([place]) 09/06/2015   Acute non-recurrent maxillary sinusitis 07/14/2015   Vaginal pruritus 05/26/2015   Chronic recurrent major depressive disorder ([place]) 03/09/2015   Osteoporosis, post-menopausal 03/09/2015   Peripheral blood vessel disorder ([place]) 03/09/2015   Acid reflux 03/09/2015   GERD (gastroesophageal reflux disease) 12/21/2014   Carotid artery narrowing 01/28/2014   Peripheral arterial occlusive disease ([place]) 06/08/2011   Occlusion and stenosis of unspecified carotid artery 06/08/2011   PAD (peripheral artery disease) ([place]) 06/08/2011    PCP: Danelle Berry, NP  REFERRING PROVIDER: Barrie Lyme MD  REFERRING DIAGNOSIS: S29.012A (ICD-10-CM) - Strain of muscle and tendon of back wall of thorax, initial encounter  THERAPY DIAG: Muscle weakness (generalized)  Other low back pain  Chronic right shoulder pain  RATIONALE FOR EVALUATION AND TREATMENT: Rehabilitation  ONSET DATE: 08/11/20 (approximate)  FROM INITIAL EVALUATION  SUBJECTIVE:  Chief Complaint: R thoracic/rib pain  Pertinent History Pt reports she she started experiencing R shoulder pain which started shortly after her RLE amputation (08/11/20). She attributes the pain to having to self-propel a wheelchair for an extended period of time. She has seen Dr. Mack Guise at [facility] in the past and underwent a R shoulder mini-open rotator cuff repair, DCE, and SAD on 02/27/20. Since the pain started she has undergone R shoulder injections without improvement in her pain. When pt describes her pain she points to the area below her R shoulder blade around T10/T11. She denies  weakness in the RUE or any pain in the area of the [facility] joint or shoulder pain. Denies N/T in back or RUE. Pain worsens with extended use of RUE especially when cleaning the counters in her apartment or sweeping the floor. She has a history of L1 and L3 compression fractures in 2017. She currently sees pain management for her R shoulder pain and takes oxycodone to manage the pain. She also struggles with chronic RLE phantom pain s/p amputation and takes gabapentin for this issue. She has a history of multiple vascular surgeries prior to her amputation and also has a history of carpal tunnel syndrome.  Pain:  Pain Intensity: Not rated today Pain location: R back/flank inferior to the shoulder blade Pain Quality: sharp  Radiating: No  Numbness/Tingling: No Focal Weakness: No Aggravating factors: Excessive use of RUE, mopping the floors, wiping countertops, no pain with driving, Relieving factors: heating pad, never tried lidocaine patch, oxycodone, gabapentin doesn't help with R flank/back pain; 24-hour pain behavior: varies depending on activity History of prior shoulder or neck/shoulder injury, pain, surgery, or therapy: Yes Falls: Has patient fallen in last 6 months? 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  Recent memory is intact.  Remote memory is intact.  Attention span and concentration are intact.  Expressive speech is intact.  Patient's fund of knowledge is within normal limits for educational level.    Gross Musculoskeletal Assessment Tremor: None Bulk: Normal Tone: Normal  Gait Pt ambulates with RLE prosthesis and lofstrand in LUE, decreased self-selected gait speed;  Posture Increased thoracic kyphosis, forward head, and rounded shoulder in sitting and standing;  Cervical Screen AROM: WFL and painless with overpressure in all planes Spurlings A (ipsilateral lateral flexion/axial compression): R: Negative L: Negative Spurlings B (ipsilateral lateral flexion/contralateral rotation/axial compression): R: Negative L: Negative Repeated movement: Not examined Hoffman Sign (cervical cord compression): R: Not examined L: Not examined ULTT Median: R: Not examined L: Not examined ULTT Ulnar: R: Not examined L: Not examined ULTT Radial: R: Not examined L: Not examined   AROM Cervical AROM is painless in all directions and WFL although not full. No reproduction of pain with AROM R shoulder flexion and abduction which is also limited but WFL. No gross deficits  noted. Pt denies pain with thoracic flexion, extension, rotation, and lateral flexion.    LE MMT:  MMT (out of 5) Right 05/06/2022 Left 05/06/2022  Cervical (isometric)  Flexion WNL  Extension WNL  Lateral Flexion WNL WNL  Rotation WNL WNL      Shoulder   Flexion 4+ 4+  Extension    Abduction 4+ 4+  External rotation (seated) 4+ 4+  Internal rotation (seated) 4+ 4+  Horizontal abduction    Horizontal adduction    Lower Trapezius    Rhomboids        Elbow  Flexion 5 5  Extension 5 5  Pronation    Supination        Wrist  Flexion 5 5  Extension 5 5  Radial deviation    Ulnar deviation        MCP  Flexion    Extension    Abduction 5  5  Adduction 5 5  (* = pain; Blank rows = not tested)  Sensation Grossly intact to light touch bilateral UE as determined by testing dermatomes C2-T2. Proprioception and hot/cold testing deferred on this date.  Reflexes Deferred  Palpation Pt is nontender to palpation around entire R shoulder girdle anterior, lateral, and posterior. No pain with palpation to R cervical paraspinals, upper trap, rhomboids, or mid trap. She has pain to palpation along spinous processes from T9-L3 as well as along paraspinals in this region on the right.  Repeated Movements Deferred  Passive Accessory Intervertebral Motion Pt reports reproduction of pain with CPA and bilateral UPA T9-L3. Unable to fully assess mobility secondary to pain however appear grossly hypomobile with notable thoracic kyphosis when prone.  SPECIAL TESTS Rotator Cuff  Drop Arm Test: Negative Painful Arc (Pain from 60 to 120 degrees scaption): Negative Infraspinatus Muscle Test: Negative  Subacromial Impingement Hawkins-Kennedy: Negative Neer (Block scapula, PROM flexion): Negative Painful Arc (Pain from 60 to 120 degrees scaption): Negative Empty Can: Negative External Rotation Resistance: Negative Horizontal Adduction: Not examined Scapular Assist: Not examined  Labral Tear Biceps Load II (120 elevation, full ER, 90 elbow flexion, full supination, resisted elbow flexion): Not examined Crank (160 scaption, axial load with IR/ER): Not examined Active Compression Test: Not examined  Bicep Tendon Pathology Speed (shoulder flexion to 90, external rotation, full elbow extension, and forearm supination with resistance: Not examined Yergason's (resisted shoulder ER and supination/biceps tendon pathology): Not examined  Shoulder Instability Sulcus Sign: Not examined Anterior Apprehension: Not examined  Beighton scale: Deferred  Outcome Measures Quick DASH: Deferred FOTO: Pt completed shoulder FOTO but this appears to be  more related to back pain. Will update at next therapy session.    TODAY'S TREATMENT   SUBJECTIVE: Pt reports 8/10 back/"R shoulder" pain upon arrival today. She saw vascular who was pleased with her LLE blood flow per patient. No specific questions or concerns currently.    PAIN: 8/10 back/"R shoulder" pain   Ther-ex: Prone alternating hip extension 2 x 10 BLE; L sidelying R hip abduction with manual resistance 2 x 10; L sidelying L hip adduction with manual resistance 2 x 10; Supine SLR with manual resistance 2 x 10 BLE; Supine bolster knee bridges 2 x 10; R sidelying L hip abduction with manual resistance 2 x 10; R sidelying R hip adduction with manual resistance 2 x 10;   Not performed: Supine R shoulder flexion from neutral to 90 with 2# dumbbell (DB) x 10, 3# DB x 10; Supine R shoulder serratus punch at 90 flexion  with 2# DB x 10, 3# DB x 10; Supine R shoulder circles at 90 flexion with 2# DB x 10 each direction, 3# DB x 10 each direction; L sidelying R shoulder ER with 3# DB 2 x 10; L sidleying R shoulder abduction with 3# DB 2 x 10; Seated dynamic hug with blue tband 2 x 10; Seated pallof press with green tband x 10 toward each side; Seated scapular retractions 2 x 10; Prone R shoulder extension with 3# dumbbell (DB) 2 x 10; Prone R low row with 3# DB 2 x 10; Prone R horizontal abduction with 3# DB 2 x 10; Prone R low trap flexion (Y) with 3# DB 2 x 10; Prone R shoulder ER with 3# DB 2 x 10; Seated PVC chest press with manual resistance 2 x 10; Seated PVC row with manual resistance 2 x 10; Seated shoulder flexion with 3# dumbbells 2 x 10; Seated shoulder scaption with 3# dumbbells 2 x 10; Seated overhead shoulder press with 3# dumbbells x 10, x 5, discontinued second set due to increase in thoracic/R flank pain;    PATIENT EDUCATION:  Education details: Pt educated throughout session about proper posture and technique with exercises. Improved exercise technique,  movement at target joints, use of target muscles after min to mod verbal, visual, tactile cues. Person educated: Patient Education method: Explanation, verbal cues, tactile cues, and demonstration Education comprehension: verbalized understanding and returned demonstration;   HOME EXERCISE PROGRAM: None currently   ASSESSMENT:  CLINICAL IMPRESSION: Continued with back/hip/core strengthening during session today. She has experienced considerable improvement in her pain since starting with therapy however she still has notable pain. Pt encouraged to follow-up as scheduled with therapy until additional plan is in place with oncology. Frequency will be maintained at 1x/wk. She will benefit from PT services to address deficits in strength and pain in order to return to full function at home with less pain.    REHAB POTENTIAL: Fair    CLINICAL DECISION MAKING: Unstable/unpredictable  EVALUATION COMPLEXITY: High   GOALS: Goals reviewed with patient? Yes  SHORT TERM GOALS: Target date:  03/14/2022  Pt will be independent with HEP to improve strength and decrease back pain to improve pain-free function at home. Baseline:  Goal status: INITIAL   LONG TERM GOALS: Target date: 04/11/2022  Pt will increase FOTO to at least 57 in order to demonstrate significant improvement in function at home and work related to back pain  Baseline: 02/14/22: To be completed at next visit; 02/22/22: 45; 03/29/22: Deferred Goal status: INITIAL  2.  Pt will decrease worst back pain by at least 2 points on the NPRS in order to demonstrate clinically significant reduction in back pain. Baseline: 02/14/22: To be completed at next visit; 03/29/22: 10/10; Goal status: INITIAL  3.  Pt will report at least 50% improvement in her back symptoms in order to demonstrate clinically significant reduction in disability related to back pain;        Baseline: 03/29/22: 80% improvement since starting with therapy; Goal status:  INITIAL   PLAN: PT FREQUENCY: 1-2x/week  PT DURATION: 8 weeks  PLANNED INTERVENTIONS: Therapeutic exercises, Therapeutic activity, Neuromuscular re-education, Balance training, Gait training, Patient/Family education, Joint manipulation, Joint mobilization, Vestibular training, Canalith repositioning, Aquatic Therapy, Dry Needling, Electrical stimulation, Spinal manipulation, Spinal mobilization, Cryotherapy, Moist heat, Traction, Ultrasound, Ionotophoresis '4mg'$ /ml Dexamethasone, and Manual therapy  PLAN FOR NEXT SESSION: Continue manual techniques for R sided back pain, continue stretching/strengthening;    Lyndel Safe Ita Fritzsche  PT, DPT, GCS  Physical Therapist- [facility]  05/06/2022, 4:54 PM

## 2022-05-07 ENCOUNTER — Encounter (INDEPENDENT_AMBULATORY_CARE_PROVIDER_SITE_OTHER): Payer: Self-pay | Admitting: Vascular Surgery

## 2022-05-08 ENCOUNTER — Ambulatory Visit: Payer: Medicare Other

## 2022-05-08 DIAGNOSIS — M5459 Other low back pain: Secondary | ICD-10-CM

## 2022-05-08 DIAGNOSIS — M6281 Muscle weakness (generalized): Secondary | ICD-10-CM

## 2022-05-08 DIAGNOSIS — G8929 Other chronic pain: Secondary | ICD-10-CM | POA: Diagnosis not present

## 2022-05-08 DIAGNOSIS — M25511 Pain in right shoulder: Secondary | ICD-10-CM | POA: Diagnosis not present

## 2022-05-10 ENCOUNTER — Inpatient Hospital Stay: Payer: Medicare Other

## 2022-05-12 ENCOUNTER — Inpatient Hospital Stay: Payer: Medicare Other | Admitting: Internal Medicine

## 2022-05-16 ENCOUNTER — Ambulatory Visit
Admission: RE | Admit: 2022-05-16 | Discharge: 2022-05-16 | Disposition: A | Discharge status: Home / Self Care | Payer: Medicare Other | Source: Ambulatory Visit | Attending: Internal Medicine | Admitting: Internal Medicine

## 2022-05-16 DIAGNOSIS — I700 Atherosclerosis of aorta: Secondary | ICD-10-CM | POA: Diagnosis not present

## 2022-05-16 DIAGNOSIS — R937 Abnormal findings on diagnostic imaging of other parts of musculoskeletal system: Secondary | ICD-10-CM | POA: Insufficient documentation

## 2022-05-16 DIAGNOSIS — M899 Disorder of bone, unspecified: Secondary | ICD-10-CM | POA: Insufficient documentation

## 2022-05-16 DIAGNOSIS — J9811 Atelectasis: Secondary | ICD-10-CM | POA: Diagnosis not present

## 2022-05-16 DIAGNOSIS — I2510 Atherosclerotic heart disease of native coronary artery without angina pectoris: Secondary | ICD-10-CM | POA: Diagnosis present

## 2022-05-16 DIAGNOSIS — J432 Centrilobular emphysema: Secondary | ICD-10-CM | POA: Insufficient documentation

## 2022-05-16 DIAGNOSIS — J439 Emphysema, unspecified: Secondary | ICD-10-CM | POA: Diagnosis not present

## 2022-05-16 LAB — GLUCOSE, CAPILLARY: Glucose-Capillary: 125 mg/dL — ABNORMAL HIGH (ref 70–99)

## 2022-05-16 MED ORDER — FLUDEOXYGLUCOSE F - 18 (FDG) INJECTION
6.7500 | Freq: Once | INTRAVENOUS | Status: AC | PRN
  Administered 2022-05-16: 6.75 via INTRAVENOUS

## 2022-05-18 ENCOUNTER — Inpatient Hospital Stay (HOSPITAL_BASED_OUTPATIENT_CLINIC_OR_DEPARTMENT_OTHER): Payer: Medicare Other | Admitting: Internal Medicine

## 2022-05-18 ENCOUNTER — Encounter: Payer: Self-pay | Admitting: Internal Medicine

## 2022-05-18 VITALS — BP 117/72 | HR 94 | Temp 97.6°F | Resp 18 | Wt 124.9 lb

## 2022-05-18 DIAGNOSIS — D472 Monoclonal gammopathy: Secondary | ICD-10-CM | POA: Diagnosis not present

## 2022-05-18 DIAGNOSIS — M8008XA Age-related osteoporosis with current pathological fracture, vertebra(e), initial encounter for fracture: Secondary | ICD-10-CM | POA: Diagnosis not present

## 2022-05-18 DIAGNOSIS — M546 Pain in thoracic spine: Secondary | ICD-10-CM | POA: Diagnosis not present

## 2022-05-18 DIAGNOSIS — R937 Abnormal findings on diagnostic imaging of other parts of musculoskeletal system: Secondary | ICD-10-CM | POA: Diagnosis not present

## 2022-05-18 DIAGNOSIS — G8929 Other chronic pain: Secondary | ICD-10-CM | POA: Diagnosis not present

## 2022-05-18 NOTE — Progress Notes (Signed)
Sara [name]  [phone] [phone]) [phone]  ID: Sara [name] OB: 04-09-1951  MR#: [id number]  NMM#:[id number]  Patient Care Team: Danelle Berry, NP as PCP - General (Nurse Practitioner)  REFERRING PROVIDER: Dr. Mack Guise  REASON FOR REFERRAL: abnormal spine MRI  HPI: Sara [name] is a 71 y.o. female with pmh of PAD s/p right AKA, CAD, HTN, HLD, chronic back pain referred to Oncology for further work up of abnormal spine MRI.   Patient reports history of chronic pain in the back specifically on the right lower side when she is doing any activity.  She had Xray of T spine and lumbar spine on 02/23/2022 which showed degenerative changes and chronic compression fracture of T11, L1 and L3. MRI T spine from Aug 2023 showed a chronic appearing compression fracture of the T11 vertebral body is present with 30%-35% loss of anterior vertebral body height. There is mild grade 1 anterolisthesis of T10 on T11 adjacent to the fracture. There are multiple small round scattered lesions within the thoracic spine with the largest measuring up to only approximately 51m in greatest diameter concerning for multiple small osseous metastases (or possibly multiple myeloma).   PET CT scan from 05/16/2022 showed scattered small foci osseous hypermetabolic activity in right medial clavicle, left medial clavicle, sternum and thoracolumbar spine with SUV between 2-3.  These lesions are fairly occult on the CT scan.  Also showed small foci of hilar and infrahilar activity which is mild may be reactive.  SPEP/UPEP/IFE showed IgA lambda monoclonal protein.  M spike was not quantified due to presence of other migrating proteins.  Kappa and lambda light chains and ratio normal.  Hemoglobin and creatinine normal.  Calcium mildly low at 8.7.  INTERVAL HISTORY:  Patient seen today to discuss PET CT scan results. She reports worsening pain in her lower back.  She [facility]  She takes it when her pain goes to 9.  She also gets injections to her back with Dr. LHolley Raringwhich is on hold currently while she is undergoing the work-up.  Patient denies fever, chills, night sweats, new lumps or bumps, nausea, vomiting, shortness of breath, cough, abdominal pain, bleeding, bowel or bladder issues. Energy level is good.  Appetite is good.  Denies any weight loss.   REVIEW OF SYSTEMS:   Review of Systems  Respiratory:  Negative for cough, shortness of breath and wheezing.   Cardiovascular:  Negative for chest pain and leg swelling.  Gastrointestinal:  Negative for abdominal pain, nausea and vomiting.  Musculoskeletal:  Positive for back pain.    As per HPI. Otherwise, a complete review of systems is negative.  PAST MEDICAL HISTORY: Past Medical History:  Diagnosis Date   Arthritis    right shoulder   Depression    Dyspnea    GERD (gastroesophageal reflux disease)    History of blood clots    Hyperlipidemia    Hypertension    Mild mitral regurgitation    Osteoporosis    Peripheral vascular disease (HCC)    Stomach ulcer    Vascular disease    Sees Dr. SDelana Meyer  Vertigo    Last episode approx Aug 2015   Wears dentures    full upper    PAST SURGICAL HISTORY: Past Surgical History:  Procedure Laterality Date   ADENOIDECTOMY     AMPUTATION Right 08/11/2020   Procedure: AMPUTATION ABOVE KNEE;  Surgeon: SKatha Cabal MD;  [facility];  Service: Vascular;  Laterality: Right;   ARTERY BIOPSY Right 07/14/2019   Procedure: BIOPSY TEMPORAL ARTERY;  Surgeon: [name], MD;  Location: [facility] ORS;  Service: Vascular;  Laterality: Right;   BREAST CYST ASPIRATION Left    CARDIAC CATHETERIZATION  02/15/2017   [facility]   COLONOSCOPY WITH PROPOFOL N/A 10/22/2017   Procedure: COLONOSCOPY WITH PROPOFOL;  Surgeon: Lucilla Lame, MD;  Location: [place];  Service: Endoscopy;  Laterality: N/A;  specimens  not taken--pt on Plavix will be brought back in after 7 days off med   COLONOSCOPY WITH PROPOFOL N/A 11/05/2017   Procedure: COLONOSCOPY WITH PROPOFOL;  Surgeon: Lucilla Lame, MD;  Location: [place];  Service: Endoscopy;  Laterality: N/A;   ESOPHAGOGASTRODUODENOSCOPY N/A 12/21/2014   Procedure: ESOPHAGOGASTRODUODENOSCOPY (EGD);  Surgeon: Lucilla Lame, MD;  Location: [place];  Service: Gastroenterology;  Laterality: N/A;   ESOPHAGOGASTRODUODENOSCOPY (EGD) WITH PROPOFOL N/A 02/08/2016   Procedure: ESOPHAGOGASTRODUODENOSCOPY (EGD) WITH PROPOFOL;  Surgeon: Lucilla Lame, MD;  Location: [facility] ENDOSCOPY;  Service: Endoscopy;  Laterality: N/A;   FASCIOTOMY Right 05/30/2019   Procedure: FASCIOTOMY;  Surgeon: [name], MD;  Location: [facility] ORS;  Service: Vascular;  Laterality: Right;   FASCIOTOMY CLOSURE Right 06/04/2019   Procedure: FASCIOTOMY CLOSURE;  Surgeon: [name], MD;  Location: [facility] ORS;  Service: Vascular;  Laterality: Right;   HEMORROIDECTOMY  2014   LOWER EXTREMITY ANGIOGRAPHY Right 05/30/2019   Procedure: LOWER EXTREMITY ANGIOGRAPHY;  Surgeon: [name], MD;  Location: [place] CV LAB;  Service: Cardiovascular;  Laterality: Right;   LOWER EXTREMITY ANGIOGRAPHY Right 07/02/2020   Procedure: LOWER EXTREMITY ANGIOGRAPHY;  Surgeon: [name], MD;  Location: [place] [facility] LAB;  Service: Cardiovascular;  Laterality: Right;   PERIPHERAL VASCULAR CATHETERIZATION N/A 02/09/2016   Procedure: Abdominal Aortogram w/Lower Extremity;  Surgeon: [name], MD;  Location: [place] CV LAB;  Service: Cardiovascular;  Laterality: N/A;   PERIPHERAL VASCULAR CATHETERIZATION Right 02/10/2016   Procedure: Lower Extremity Angiography;  Surgeon: [name], MD;  Location: [place] CV LAB;  Service: Cardiovascular;  Laterality: Right;   POLYPECTOMY  11/05/2017   Procedure: POLYPECTOMY;  Surgeon: Lucilla Lame, MD;  Location: [place];  Service: Endoscopy;;   SHOULDER ARTHROSCOPY WITH ROTATOR CUFF REPAIR AND SUBACROMIAL DECOMPRESSION Right 02/27/2020   Procedure: RIGHT SHOULDER ARTHROSCOPY SUBACROMIAL DECOMPRESSION, DISTAL CLAVICLE EXCISION AND MINI-OPEN ROTATOR CUFF REPAIR;  Surgeon: Thornton Park, MD;  Location: [facility] ORS;  Service: Orthopedics;  Laterality: Right;   TONSILLECTOMY     VASCULAR SURGERY  [id number]   Fem-Pop Bypass    FAMILY HISTORY: Family History  Problem Relation Age of Onset   CVA Mother    Heart attack Mother    Cancer Father        colon cancer   Colon cancer Father    Heart disease Maternal Uncle    Breast cancer Neg Hx     HEALTH MAINTENANCE: Social History   Tobacco Use   Smoking status: Former    Packs/day: 2.00    Years: 25.00    Total pack years: 50.00    Types: Cigarettes    Quit date: 08/22/1991    Years since quitting: 30.7   Smokeless tobacco: Never  Vaping Use   Vaping Use: Never used  Substance Use Topics   Alcohol use: No    Alcohol/week: 0.0 standard drinks of alcohol   Drug use: Never     Allergies  Allergen Reactions  Hydrocodone-Acetaminophen     Other reaction(s): Flushing Other reaction(s): Flushing   Amoxicillin Other (See Comments)    Yeast infection   Metoprolol Tartrate Rash   Oxycodone Itching   Vicodin [Hydrocodone-Acetaminophen] Hives and Rash    Flushing    Current Outpatient Medications  Medication Sig Dispense Refill   alendronate (FOSAMAX) 10 MG tablet Take 10 mg by mouth daily before breakfast. Take with a full glass of water on an empty stomach.     apixaban (ELIQUIS) 5 MG TABS tablet Take 1 tablet (5 mg total) by mouth 2 (two) times daily. 60 tablet 11   atorvastatin (LIPITOR) 40 MG tablet Take 40 mg by mouth at bedtime.      calcium carbonate (OSCAL) 1500 (600 Ca) MG TABS tablet Take 1,200 mg of elemental calcium by mouth 2 (two) times daily with a meal.     cholecalciferol (VITAMIN D3) 25 MCG (1000 UNIT) tablet Take  1,000 Units by mouth daily.     digoxin (LANOXIN) 0.25 MG tablet Take by mouth.     DULoxetine (CYMBALTA) 60 MG capsule Take 60 mg by mouth 2 (two) times daily.     gabapentin (NEURONTIN) 800 MG tablet Take 1 tablet (800 mg total) by mouth 3 (three) times daily. 90 tablet 3   losartan (COZAAR) 25 MG tablet Take 25 mg by mouth every morning.      omeprazole (PRILOSEC) 20 MG capsule Take 20 mg by mouth daily.     oxyCODONE-acetaminophen (PERCOCET) 7.5-325 MG tablet one tablet po q 8-12 hours as needed for chronic pain.     Tocilizumab (ACTEMRA ACTPEN) 162 MG/0.9ML SOAJ Inject 162 mg into the skin once a week.      zolpidem (AMBIEN) 5 MG tablet Take 5 mg by mouth at bedtime as needed for sleep.      No current facility-administered medications for this visit.    OBJECTIVE: Vitals:   05/18/22 1053  BP: 117/72  Pulse: 94  Resp: 18  Temp: 97.6 F (36.4 C)     Body mass index is 22.13 kg/m.      General: Well-developed, well-nourished, no acute distress. Eyes: Pink conjunctiva, anicteric sclera. HEENT: Normocephalic, moist mucous membranes, clear oropharnyx. Lungs: Clear to auscultation bilaterally. Heart: Regular rate and rhythm. No rubs, murmurs, or gallops. Abdomen: Soft, nontender, nondistended. No organomegaly noted, normoactive bowel sounds. Musculoskeletal: No edema, cyanosis, or clubbing. Neuro: Alert, answering all questions appropriately. Cranial nerves grossly intact. Skin: No rashes or petechiae noted. Psych: Normal affect. Lymphatics: No cervical, calvicular, axillary or inguinal LAD.   LAB RESULTS:  Lab Results  Component Value Date   NA 136 04/21/2022   K 4.5 04/21/2022   CL 102 04/21/2022   CO2 27 04/21/2022   GLUCOSE 103 (H) 04/21/2022   BUN 9 04/21/2022   CREATININE 0.76 04/21/2022   CALCIUM 8.7 (L) 04/21/2022   PROT 6.6 04/21/2022   ALBUMIN 4.0 04/21/2022   AST 32 04/21/2022   ALT 36 04/21/2022   ALKPHOS 57 04/21/2022   BILITOT 0.4 04/21/2022    GFRNONAA >60 04/21/2022   GFRAA >60 02/18/2020    Lab Results  Component Value Date   WBC 6.9 04/21/2022   NEUTROABS 4.8 04/21/2022   HGB 14.3 04/21/2022   HCT 44.3 04/21/2022   MCV 96.9 04/21/2022   PLT 182 04/21/2022    No results found for: "TIBC", "FERRITIN", "IRONPCTSAT"   STUDIES: NM PET Image Initial (PI) Skull Base To Thigh  Result Date: 05/18/2022 CLINICAL DATA:  Initial  treatment strategy for reported thoracic spine lesions on outside MRI. EXAM: NUCLEAR MEDICINE PET SKULL BASE TO THIGH TECHNIQUE: 6.8 mCi F-18 FDG was injected intravenously. Full-ring PET imaging was performed from the skull base to thigh after the radiotracer. CT data was obtained and used for attenuation correction and anatomic localization. Fasting blood glucose: 125 mg/dl COMPARISON:  CT scan 02/05/2016 FINDINGS: Mediastinal blood pool activity: SUV max 2.0 Liver activity: SUV max NA NECK: No significant abnormal hypermetabolic activity in this region. Incidental CT findings: Left common carotid atherosclerotic calcification. CHEST: Small hilar and infrahilar foci of activity, for example maximum SUV 2.8 in the right hilum 3.0 in the right infrahilar region, potentially representing mildly hypermetabolic small lymph nodes, although no pathologically enlarged lymph nodes are identified on the CT data. Incidental CT findings: Coronary, aortic arch, and branch vessel atherosclerotic vascular disease. Borderline elevated right hemidiaphragm. Centrilobular emphysema. Mild dependent subsegmental atelectasis in both lungs. ABDOMEN/PELVIS: No significant abnormal hypermetabolic activity in this region. Incidental CT findings: Atherosclerosis is present, including aortoiliac atherosclerotic disease. Prominent stool throughout the colon favors constipation. SKELETON: Scattered small foci osseous hypermetabolic activity noted for example in the right medial clavicle (maximum SUV 2.4), left medial clavicle (maximum SUV 3.0),  right sternal manubrium (maximum SUV 3.2), and sternal body (maximum SUV 3.2). These lesions are fairly occult on the CT data. Other faint foci of accentuated metabolic activity are observed in the thoracolumbar spine, for example a T4 posterior vertebral body lesion with maximum SUV 2.3, but are relatively indistinct. Incidental CT findings: Various compression fractures are identified including T11, L1, and L3. IMPRESSION: 1. There are a few scattered small mildly hypermetabolic bony lesions primarily noted in the sternum and distal clavicles, questionably in the thoracolumbar spine. The thoracic spine MRI examination prompting today's PET-CT is not available, but given that findings were concerning for bony malignancy, this must be considered suspicious. I do not see a separate primary source for metastatic disease to the skeleton; serologic workup to exclude myeloma is suggested. 2. Small foci of hilar and infrahilar activity, mildly above background activity. No visible enlarged lymph nodes. These hilar and infrahilar nodes may be reactive. 3. Other imaging findings of potential clinical significance: Aortic Atherosclerosis (ICD10-I70.0) and Emphysema (ICD10-J43.9). Coronary atherosclerosis. Prominent stool throughout the colon favors constipation. Compression fractures at T11, L1, and L3. Electronically Signed   By: Van Clines M.D.   On: 05/18/2022 09:22   VAS [place] ABI WITH/WO TBI  Result Date: 05/15/2022  LOWER EXTREMITY DOPPLER STUDY Patient Name:  Willine Schwalbe  Date of Exam:   05/01/2022 Medical Rec #: [id number]             Accession #:    [id number] Date of Birth: 1950-10-25              Patient Gender: F Patient Age:   70 years Exam Location:  [facility] Procedure:      VAS [place] ABI WITH/WO TBI Referring Phys: [name] --------------------------------------------------------------------------------  Indications: Rest pain, and peripheral artery disease. High Risk Factors:  Hypertension.  Vascular Interventions: 02/10/2016 PTA of right femoral to popliteal autogenous                         bypass graft. PTA right distal posterior tibial artery.                         12/2008 and 02/1990 Right femoral to popliteal  autogenous                         bypass graft.                          05/30/2019: PTA and Stent placement at 2 locations in                         the Femoral below knee poplteal bypass. Mechanical                         Thrombectomy Right Popliteal bypass and TibioPeroneal                         trunk and Proximal Peroneal Artery. Infusion of TPA to                         the Right Femoral-Popliteal Bypass.                          05/30/2019: Compartment Syndrome Right Lower Extremity                         Status Post Revascularization.                          06/04/2019: Compartment Syndrome Status Post                         Fasciotomies. Comparison Study: 08/01/2021 Performing Technologist: Almira Coaster RVS  Examination Guidelines: A complete evaluation includes at minimum, Doppler waveform signals and systolic blood pressure reading at the level of bilateral brachial, anterior tibial, and posterior tibial arteries, when vessel segments are accessible. Bilateral testing is considered an integral part of a complete examination. Photoelectric Plethysmograph (PPG) waveforms and toe systolic pressure readings are included as required and additional duplex testing as needed. Limited examinations for reoccurring indications may be performed as noted.  ABI Findings: +--------+------------------+-----+--------+--------+ Right   Rt Pressure (mmHg)IndexWaveformComment  +--------+------------------+-----+--------+--------+ [id number]                                     +--------+------------------+-----+--------+--------+ +---------+------------------+-----+---------+-------+ Left     Lt Pressure (mmHg)IndexWaveform Comment  +---------+------------------+-----+---------+-------+ Brachial 120                                     +---------+------------------+-----+---------+-------+ ATA      144               1.20 biphasic         +---------+------------------+-----+---------+-------+ PTA      141               1.18 triphasic        +---------+------------------+-----+---------+-------+ Great Toe96                0.80 Normal           +---------+------------------+-----+---------+-------+ +-------+-----------+-----------+------------+------------+ ABI/TBIToday's ABIToday's TBIPrevious ABIPrevious TBI +-------+-----------+-----------+------------+------------+ Right  AKA  AKA                      +-------+-----------+-----------+------------+------------+ Left   1.20       .80        1.16        .71          +-------+-----------+-----------+------------+------------+ Left ABIs appear essentially unchanged compared to prior study on 08/01/2021. Left TBIs appear increased compared to prior study on 08/01/2021.  Summary: Left: Resting left ankle-brachial index is within normal range. The left toe-brachial index is normal. *See table(s) above for measurements and observations.   Electronically signed by [name] MD on 05/15/2022 at 5:27:44 PM.    Final    MR OUTSIDE FILMS SPINE  Result Date: 04/21/2022 This examination belongs to an outside facility and is stored here for comparison purposes only.  Contact the originating outside institution for any associated report or interpretation.   ASSESSMENT AND PLAN:   Darlynn Ricco is a 71 y.o. female with pmh of PAD s/p right AKA, CAD, HTN, HLD, chronic back pain referred to Oncology for further work up of abnormal spine MRI.   # Hypermetabolic osseous lesions  - unclear etiology  - Patient reports history of chronic pain in the back.  - MRI T spine from Aug 2023 showed a chronic appearing compression fracture of the  T11 vertebral body is present with 30%-35% loss of anterior vertebral body height. There are multiple small round scattered lesions within the thoracic spine with the largest measuring up to only approximately 74m in greatest diameter concerning for multiple small osseous metastases (or possibly multiple myeloma).   - PET CT scan from 05/16/2022 showed scattered small foci osseous hypermetabolic activity in right medial clavicle, left medial clavicle, sternum and thoracolumbar spine with SUV between 2-3.  These lesions are fairly occult on the CT scan.  Also showed small foci of hilar and infrahilar activity which is mild may be reactive.  - SPEP/UPEP/IFE showed IgA lambda monoclonal protein.  M spike could not be quantified due to presence of other migrating proteins.  Kappa and lambda light chains and ratio normal.  Hemoglobin and creatinine normal.  Calcium mildly low at 8.7.  -I discussed with the patient PET CT scan results.  PET scan is showing osseous lesion in clavicle, sternum and thoracolumbar spine but activity is low ranging 2-3.  Considering PET/CT findings and presence of IgA lambda monoclonal protein, recommended bone marrow biopsy to rule out plasma cell dyscrasia.  Patient is agreeable with the plan.  # Back pain  -Patient reports worsening of back pain.  She is [facility] 7.5-325 [facility]h [facility] She takes pain meds once the pain reaches 9 and then it takes time to control it. Discussed about taking it when the pain is still moderate for better control.   Orders Placed This Encounter  Procedures   CT BONE MARROW BIOPSY & ASPIRATION   RTC in 3 weeks for MD visit, discuss bmbx  Patient expressed understanding and was in agreement with this plan. She also understands that She can call clinic at any time with any questions, concerns, or complaints.   I spent a total of 30 minutes reviewing chart data, face-to-face evaluation with the patient, counseling and  coordination of care as detailed above.  KJane Canary MD   05/18/2022 1:33 PM

## 2022-05-18 NOTE — Progress Notes (Signed)
Patient here today for follow up regarding abnormal MRI, PET scan results. Patient reports lower back pain, 9/10 at its worst. Patient takes percocet for severe pain, 3/10 with medication.

## 2022-05-19 ENCOUNTER — Ambulatory Visit: Payer: Medicare Other

## 2022-05-19 ENCOUNTER — Telehealth: Payer: Self-pay | Admitting: *Deleted

## 2022-05-19 NOTE — Therapy (Deleted)
OUTPATIENT PHYSICAL THERAPY SHOULDER/BACK TREATMENT/RECERTIFICATION/DISCHARGE?   Patient Name: Sara [name] MRN: [id number] DOB:[birth year], 71 y.o., female Today's Date: 05/19/2022   PT End of Session - 05/19/22 1001     Visit Number 18    Number of Visits 25    Date for PT Re-Evaluation 05/09/22    Authorization Type eval: 02/14/22    PT Start Time 1015    PT Stop Time 1100    PT Time Calculation (min) 45 min    Activity Tolerance Patient tolerated treatment well    Behavior During Therapy [facility] for tasks assessed/performed             Past Medical History:  Diagnosis Date   Arthritis    right shoulder   Depression    Dyspnea    GERD (gastroesophageal reflux disease)    History of blood clots    Hyperlipidemia    Hypertension    Mild mitral regurgitation    Osteoporosis    Peripheral vascular disease ([place])    Stomach ulcer    Vascular disease    Sees Dr. Delana Meyer   Vertigo    Last episode approx Aug 2015   Wears dentures    full upper   Past Surgical History:  Procedure Laterality Date   ADENOIDECTOMY     AMPUTATION Right 08/11/2020   Procedure: AMPUTATION ABOVE KNEE;  Surgeon: [name], MD;  Location: [facility];  Service: Vascular;  Laterality: Right;   ARTERY BIOPSY Right 07/14/2019   Procedure: BIOPSY TEMPORAL ARTERY;  Surgeon: [name], MD;  Location: [facility];  Service: Vascular;  Laterality: Right;   BREAST CYST ASPIRATION Left    CARDIAC CATHETERIZATION  02/15/2017   [facility]   COLONOSCOPY WITH PROPOFOL N/A 10/22/2017   Procedure: COLONOSCOPY WITH PROPOFOL;  Surgeon: Lucilla Lame, MD;  Location: [place];  Service: Endoscopy;  Laterality: N/A;  specimens not taken--pt on Plavix will be brought back in after 7 days off med   COLONOSCOPY WITH PROPOFOL N/A 11/05/2017   Procedure: COLONOSCOPY WITH PROPOFOL;  Surgeon: Lucilla Lame, MD;  Location: [place];  Service: Endoscopy;  Laterality: N/A;    ESOPHAGOGASTRODUODENOSCOPY N/A 12/21/2014   Procedure: ESOPHAGOGASTRODUODENOSCOPY (EGD);  Surgeon: Lucilla Lame, MD;  Location: [place];  Service: Gastroenterology;  Laterality: N/A;   ESOPHAGOGASTRODUODENOSCOPY (EGD) WITH PROPOFOL N/A 02/08/2016   Procedure: ESOPHAGOGASTRODUODENOSCOPY (EGD) WITH PROPOFOL;  Surgeon: Lucilla Lame, MD;  Location: [facility];  Service: Endoscopy;  Laterality: N/A;   FASCIOTOMY Right 05/30/2019   Procedure: FASCIOTOMY;  Surgeon: [name], MD;  Location: [facility];  Service: Vascular;  Laterality: Right;   FASCIOTOMY CLOSURE Right 06/04/2019   Procedure: FASCIOTOMY CLOSURE;  Surgeon: [name], MD;  Location: [facility];  Service: Vascular;  Laterality: Right;   HEMORROIDECTOMY  2014   LOWER EXTREMITY ANGIOGRAPHY Right 05/30/2019   Procedure: LOWER EXTREMITY ANGIOGRAPHY;  Surgeon: [name], MD;  Location: [place] [facility];  Service: Cardiovascular;  Laterality: Right;   LOWER EXTREMITY ANGIOGRAPHY Right 07/02/2020   Procedure: LOWER EXTREMITY ANGIOGRAPHY;  Surgeon: [name], MD;  Location: [place] [facility];  Service: Cardiovascular;  Laterality: Right;   PERIPHERAL VASCULAR CATHETERIZATION N/A 02/09/2016   Procedure: Abdominal Aortogram w/Lower Extremity;  Surgeon: [name], MD;  Location: [place] [facility];  Service: Cardiovascular;  Laterality: N/A;   PERIPHERAL VASCULAR CATHETERIZATION Right 02/10/2016   Procedure: Lower Extremity Angiography;  Surgeon: [name], MD;  Location: [facility];  Service: Cardiovascular;  Laterality: Right;   POLYPECTOMY  11/05/2017   Procedure: POLYPECTOMY;  Surgeon: Lucilla Lame, MD;  Location: [place];  Service: Endoscopy;;   SHOULDER ARTHROSCOPY WITH ROTATOR CUFF REPAIR AND SUBACROMIAL DECOMPRESSION Right 02/27/2020   Procedure: RIGHT SHOULDER ARTHROSCOPY SUBACROMIAL DECOMPRESSION, DISTAL CLAVICLE EXCISION AND MINI-OPEN ROTATOR CUFF REPAIR;   Surgeon: Thornton Park, MD;  Location: [facility];  Service: Orthopedics;  Laterality: Right;   TONSILLECTOMY     VASCULAR SURGERY  9211,9417   Fem-Pop Bypass   Patient Active Problem List   Diagnosis Date Noted   IgA monoclonal gammopathy 05/18/2022   Degeneration of lumbar intervertebral disc 01/04/2022   Shoulder pain 01/04/2022   Pain in joint of right shoulder 01/04/2022   Spasm of back muscles 01/04/2022   Thoracic back pain 01/04/2022   History of left above knee amputation [facility]) (Dec 2021) 12/27/2021   Rotator cuff arthropathy of right shoulder 12/27/2021   Hx of rotator cuff surgery 12/27/2021   Chronic pain syndrome 12/27/2021   Adhesive capsulitis of right shoulder 12/05/2021   Phantom pain ([place]) 11/10/2021   Muscle spasm 09/09/2020   S/P AKA (above knee amputation), right ([place]) 09/08/2020   Ischemia of extremity 07/02/2020   Chronic pain in right shoulder 11/25/2019   Encounter for long-term (current) use of high-risk medication 07/16/2019   GCA (giant cell arteritis) ([place]) 07/16/2019   Temporal arteritis ([place]) 07/13/2019   Postoperative wound infection 06/23/2019   Ischemic leg 05/31/2019   Atherosclerosis of native arteries of extremity with intermittent claudication ([place]) 05/30/2019   Depression 05/29/2019   Eczema of lower extremity 03/04/2018   Chronic venous insufficiency 03/02/2018   Personal history of colonic polyps    Family history of colonic polyps    Benign neoplasm of ascending colon    Mitral valve insufficiency 02/08/2017   Dyspnea on exertion 02/07/2017   Non-rheumatic mitral regurgitation 02/07/2017   Precordial pain 02/07/2017   CAD (coronary artery disease) 12/21/2016   Essential hypertension 12/21/2016   Compression fracture of lumbar spine, non-traumatic ([place]) 04/26/2016   Compression fracture of L3 lumbar vertebra 04/20/2016   PVD (peripheral vascular disease) ([place]) 02/24/2016   Family history of premature CAD 02/24/2016   Gastritis     Other specified diseases of esophagus    Hiatal hernia    Gastritis and gastroduodenitis    Ischemia of lower extremity    Arterial occlusion ([place])    Atherosclerosis of aorta ([place])    History of smoking    Hyperlipidemia    Pain in the chest    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 02/05/2016   Carotid artery stenosis 01/04/2016   Vertigo, benign paroxysmal 12/28/2015   Clinical depression 09/06/2015   History of alcoholism ([place]) 09/06/2015   Angiopathy, 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[place]) 09/06/2015   Atherosclerosis of native arteries of extremity with rest pain ([place]) 09/06/2015   Acute non-recurrent maxillary sinusitis 07/14/2015   Vaginal pruritus 05/26/2015   Chronic recurrent major depressive disorder ([place]) 03/09/2015   Osteoporosis, post-menopausal 03/09/2015   Peripheral blood vessel disorder ([place]) 03/09/2015   Acid reflux 03/09/2015   GERD (gastroesophageal reflux disease) 12/21/2014   Carotid artery narrowing 01/28/2014   Peripheral arterial occlusive disease ([place]) 06/08/2011   Occlusion and stenosis of unspecified carotid artery 06/08/2011   PAD (peripheral artery disease) ([place]) 06/08/2011    PCP: Danelle Berry, NP  REFERRING PROVIDER: Barrie Lyme MD  REFERRING DIAGNOSIS: S29.012A (ICD-10-CM) - Strain of muscle and tendon of back  wall of thorax, initial encounter  THERAPY DIAG: Muscle weakness (generalized)  Other low back pain  Chronic right shoulder pain  RATIONALE FOR EVALUATION AND TREATMENT: Rehabilitation  ONSET DATE: 08/11/20 (approximate)  FROM INITIAL EVALUATION  SUBJECTIVE:                                                                                                                                                                                         Chief Complaint: R thoracic/rib pain  Pertinent History Pt reports she she started experiencing R shoulder pain which started shortly after her RLE amputation (08/11/20). She  attributes the pain to having to self-propel a wheelchair for an extended period of time. She has seen Dr. Mack Guise at [facility] in the past and underwent a R shoulder mini-open rotator cuff repair, DCE, and SAD on 02/27/20. Since the pain started she has undergone R shoulder injections without improvement in her pain. When pt describes her pain she points to the area below her R shoulder blade around T10/T11. She denies weakness in the RUE or any pain in the area of the [facility] joint or shoulder pain. Denies N/T in back or RUE. Pain worsens with extended use of RUE especially when cleaning the counters in her apartment or sweeping the floor. She has a history of L1 and L3 compression fractures in 2017. She currently sees pain management for her R shoulder pain and takes oxycodone to manage the pain. She also struggles with chronic RLE phantom pain s/p amputation and takes gabapentin for this issue. She has a history of multiple vascular surgeries prior to her amputation and also has a history of carpal tunnel syndrome.  Pain:  Pain Intensity: Not rated today Pain location: R back/flank inferior to the shoulder blade Pain Quality: sharp  Radiating: No  Numbness/Tingling: No Focal Weakness: No Aggravating factors: Excessive use of RUE, mopping the floors, wiping countertops, no pain with driving, Relieving factors: heating pad, never tried lidocaine patch, oxycodone, gabapentin doesn't help with R flank/back pain; 24-hour pain behavior: varies depending on activity History of prior shoulder or neck/shoulder injury, pain, surgery, or therapy: Yes Falls: Has patient fallen in last 6 months? 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  Recent memory is intact.  Remote memory is intact.  Attention span and concentration are intact.  Expressive speech is intact.  Patient's fund of knowledge is within normal limits for educational level.    Gross Musculoskeletal Assessment Tremor: None Bulk: Normal Tone: Normal  Gait Pt ambulates with RLE prosthesis and lofstrand in LUE, decreased self-selected gait speed;  Posture Increased thoracic kyphosis, forward head, and rounded shoulder in sitting and standing;  Cervical Screen AROM: WFL and painless with overpressure in all planes Spurlings A (ipsilateral lateral flexion/axial compression): R: Negative L: Negative Spurlings B (ipsilateral lateral flexion/contralateral rotation/axial compression): R: Negative L: Negative Repeated movement: Not examined Hoffman Sign (cervical cord compression): R: Not examined L: Not examined ULTT Median: R: Not examined L: Not examined ULTT Ulnar: R: Not examined L: Not examined ULTT Radial: R: Not examined L: Not examined   AROM Cervical AROM is painless in all directions and WFL although not full. No reproduction of pain with AROM R shoulder flexion and abduction which is also limited but WFL. No gross deficits noted. Pt denies pain with thoracic flexion, extension, rotation, and lateral flexion.    LE MMT:  MMT (out of 5) Right 05/19/2022 Left 05/19/2022  Cervical (isometric)  Flexion WNL  Extension WNL  Lateral Flexion WNL WNL  Rotation WNL  WNL      Shoulder   Flexion 4+ 4+  Extension    Abduction 4+ 4+  External rotation (seated) 4+ 4+  Internal rotation (seated) 4+ 4+  Horizontal abduction    Horizontal adduction    Lower Trapezius    Rhomboids        Elbow  Flexion 5 5  Extension 5 5  Pronation    Supination        Wrist  Flexion 5 5  Extension 5 5  Radial deviation    Ulnar deviation        MCP  Flexion    Extension    Abduction 5 5  Adduction 5 5  (* = pain; Blank rows = not tested)  Sensation Grossly intact to light touch bilateral UE as determined by testing dermatomes C2-T2. Proprioception and hot/cold testing deferred on this date.  Reflexes Deferred  Palpation Pt is nontender to palpation around entire R shoulder girdle anterior, lateral, and posterior. No pain with palpation to R cervical paraspinals, upper trap, rhomboids, or mid trap. She has pain to palpation along spinous processes from T9-L3 as well as along paraspinals in this region on the right.  Repeated Movements Deferred  Passive Accessory Intervertebral Motion Pt reports reproduction of pain with CPA and bilateral UPA T9-L3. Unable to fully assess mobility secondary to pain however appear grossly hypomobile with notable thoracic kyphosis when prone.  SPECIAL TESTS Rotator Cuff  Drop Arm Test: Negative Painful Arc (Pain from 60 to 120 degrees scaption): Negative Infraspinatus Muscle Test: Negative  Subacromial Impingement Hawkins-Kennedy: Negative Neer (Block scapula, PROM flexion): Negative Painful Arc (Pain from 60 to 120 degrees scaption): Negative Empty Can: Negative External Rotation Resistance: Negative Horizontal Adduction: Not examined Scapular Assist: Not examined  Labral Tear Biceps Load II (120 elevation, full ER, 90 elbow flexion, full supination, resisted elbow flexion): Not examined Crank (160 scaption, axial load with IR/ER): Not examined Active  Compression Test: Not examined  Bicep Tendon  Pathology Speed (shoulder flexion to 90, external rotation, full elbow extension, and forearm supination with resistance: Not examined Yergason's (resisted shoulder ER and supination/biceps tendon pathology): Not examined  Shoulder Instability Sulcus Sign: Not examined Anterior Apprehension: Not examined  Beighton scale: Deferred  Outcome Measures Quick DASH: Deferred FOTO: Pt completed shoulder FOTO but this appears to be more related to back pain. Will update at next therapy session.    TODAY'S TREATMENT   SUBJECTIVE: Pt denies back/"R shoulder" pain upon arrival today. She is still awaiting her PET scan. No specific questions or concerns currently.    PAIN: Denies   Ther-ex: Prone alternating hip extension with manual resistance 2 x 10 BLE; Prone L knee flexion with manual resistance 2 x 10; L sidelying R hip abduction with manual resistance 2 x 10; L sidelying L hip adduction with manual resistance 2 x 10; Supine SLR with manual resistance 2 x 10 BLE; Supine bolster knee bridges 2 x 10; R sidelying L hip abduction with manual resistance 2 x 10; R sidelying R hip adduction with manual resistance 2 x 10; Seated shoulder flexion with 3# dumbbells 2 x 10; Seated shoulder scaption with 3# dumbbells 2 x 10;   Not performed: Supine R shoulder flexion from neutral to 90 with 2# dumbbell (DB) x 10, 3# DB x 10; Supine R shoulder serratus punch at 90 flexion with 2# DB x 10, 3# DB x 10; Supine R shoulder circles at 90 flexion with 2# DB x 10 each direction, 3# DB x 10 each direction; L sidelying R shoulder ER with 3# DB 2 x 10; L sidleying R shoulder abduction with 3# DB 2 x 10; Prone R shoulder extension with 3# dumbbell (DB) 2 x 10; Prone R low row with 3# DB 2 x 10; Prone R horizontal abduction with 3# DB 2 x 10; Prone R low trap flexion (Y) with 3# DB 2 x 10; Prone R shoulder ER with 3# DB 2 x 10; Seated dynamic hug with blue tband 2 x 10; Seated pallof press with green  tband x 10 toward each side; Seated scapular retractions 2 x 10; Seated PVC chest press with manual resistance 2 x 10; Seated PVC row with manual resistance 2 x 10; Seated overhead shoulder press with 3# dumbbells x 10, x 5, discontinued second set due to increase in thoracic/R flank pain;   PATIENT EDUCATION:  Education details: Pt educated throughout session about proper posture and technique with exercises. Improved exercise technique, movement at target joints, use of target muscles after min to mod verbal, visual, tactile cues. Person educated: Patient Education method: Explanation, verbal cues, tactile cues, and demonstration Education comprehension: verbalized understanding and returned demonstration;   HOME EXERCISE PROGRAM: None currently   ASSESSMENT:  CLINICAL IMPRESSION: Continued with back/hip/core strengthening during session today. She has experienced considerable improvement in her pain since starting with therapy however she still has notable pain. No pain reported during session today. She has a PET scan scheduled before her next appointment with therapy and depending on results will determine plan of care with patient. Pt encouraged to follow-up as scheduled with therapy until additional plan is in place with oncology. Frequency will be maintained at 1x/wk. She will benefit from PT services to address deficits in strength and pain in order to return to full function at home with less pain.    REHAB POTENTIAL: Fair    CLINICAL DECISION MAKING: Unstable/unpredictable  EVALUATION  COMPLEXITY: High   GOALS: Goals reviewed with patient? Yes  SHORT TERM GOALS: Target date:  03/14/2022  Pt will be independent with HEP to improve strength and decrease back pain to improve pain-free function at home. Baseline:  Goal status: INITIAL   LONG TERM GOALS: Target date: 04/11/2022  Pt will increase FOTO to at least 57 in order to demonstrate significant improvement in  function at home and work related to back pain  Baseline: 02/14/22: To be completed at next visit; 02/22/22: 45; 03/29/22: Deferred Goal status: INITIAL  2.  Pt will decrease worst back pain by at least 2 points on the NPRS in order to demonstrate clinically significant reduction in back pain. Baseline: 02/14/22: To be completed at next visit; 03/29/22: 10/10; Goal status: INITIAL  3.  Pt will report at least 50% improvement in her back symptoms in order to demonstrate clinically significant reduction in disability related to back pain;        Baseline: 03/29/22: 80% improvement since starting with therapy; Goal status: INITIAL   PLAN: PT FREQUENCY: 1-2x/week  PT DURATION: 8 weeks  PLANNED INTERVENTIONS: Therapeutic exercises, Therapeutic activity, Neuromuscular re-education, Balance training, Gait training, Patient/Family education, Joint manipulation, Joint mobilization, Vestibular training, Canalith repositioning, Aquatic Therapy, Dry Needling, Electrical stimulation, Spinal manipulation, Spinal mobilization, Cryotherapy, Moist heat, Traction, Ultrasound, Ionotophoresis '4mg'$ /ml Dexamethasone, and Manual therapy  PLAN FOR NEXT SESSION: Continue manual techniques for R sided back pain, continue stretching/strengthening;    Phillips Grout PT, DPT, GCS  Physical Therapist- [facility]  05/19/2022, 10:03 AM

## 2022-05-19 NOTE — Telephone Encounter (Signed)
Call placed to patient to review appointment details for bone marrow biopsy. Patient scheduled for 10/11 at 8:30, arrive at 7:30 for procedure. Patient also made aware she will need someone to drive her to and from appointment. Patient verbalized understanding and is in agreement with plan.

## 2022-05-20 DIAGNOSIS — I2510 Atherosclerotic heart disease of native coronary artery without angina pectoris: Secondary | ICD-10-CM | POA: Diagnosis not present

## 2022-05-20 DIAGNOSIS — I10 Essential (primary) hypertension: Secondary | ICD-10-CM | POA: Diagnosis not present

## 2022-05-23 ENCOUNTER — Ambulatory Visit: Payer: Medicare Other | Attending: Orthopedic Surgery

## 2022-05-23 DIAGNOSIS — G8929 Other chronic pain: Secondary | ICD-10-CM | POA: Insufficient documentation

## 2022-05-23 DIAGNOSIS — M25511 Pain in right shoulder: Secondary | ICD-10-CM | POA: Diagnosis not present

## 2022-05-23 DIAGNOSIS — M6281 Muscle weakness (generalized): Secondary | ICD-10-CM | POA: Insufficient documentation

## 2022-05-23 DIAGNOSIS — R262 Difficulty in walking, not elsewhere classified: Secondary | ICD-10-CM | POA: Diagnosis not present

## 2022-05-23 DIAGNOSIS — M5459 Other low back pain: Secondary | ICD-10-CM | POA: Diagnosis not present

## 2022-05-23 NOTE — Therapy (Signed)
OUTPATIENT PHYSICAL THERAPY SHOULDER/BACK TREATMENT/RECERTIFICATION  Patient Name: Sara [name] MRN: [id number] DOB:Dec 22, 1950, 71 y.o., female Today's Date: 05/23/2022   PT End of Session - 05/23/22 1119     Visit Number 18    Number of Visits 33    Date for PT Re-Evaluation 07/18/22    Authorization Type eval: 0/09/23, recert: 30/0/76    PT Start Time 1105    PT Stop Time 1145    PT Time Calculation (min) 40 min    Activity Tolerance Patient tolerated treatment well    Behavior During Therapy [facility] for tasks assessed/performed            Past Medical History:  Diagnosis Date   Arthritis    right shoulder   Depression    Dyspnea    GERD (gastroesophageal reflux disease)    History of blood clots    Hyperlipidemia    Hypertension    Mild mitral regurgitation    Osteoporosis    Peripheral vascular disease ([place])    Stomach ulcer    Vascular disease    Sees Dr. Delana Meyer   Vertigo    Last episode approx Aug 2015   Wears dentures    full upper   Past Surgical History:  Procedure Laterality Date   ADENOIDECTOMY     AMPUTATION Right 08/11/2020   Procedure: AMPUTATION ABOVE KNEE;  Surgeon: [name], MD;  Location: [facility];  Service: Vascular;  Laterality: Right;   ARTERY BIOPSY Right 07/14/2019   Procedure: BIOPSY TEMPORAL ARTERY;  Surgeon: [name], MD;  Location: [facility];  Service: Vascular;  Laterality: Right;   BREAST CYST ASPIRATION Left    CARDIAC CATHETERIZATION  02/15/2017   [facility]   COLONOSCOPY WITH PROPOFOL N/A 10/22/2017   Procedure: COLONOSCOPY WITH PROPOFOL;  Surgeon: Lucilla Lame, MD;  Location: [place];  Service: Endoscopy;  Laterality: N/A;  specimens not taken--pt on Plavix will be brought back in after 7 days off med   COLONOSCOPY WITH PROPOFOL N/A 11/05/2017   Procedure: COLONOSCOPY WITH PROPOFOL;  Surgeon: Lucilla Lame, MD;  Location: [place];  Service: Endoscopy;  Laterality: N/A;    ESOPHAGOGASTRODUODENOSCOPY N/A 12/21/2014   Procedure: ESOPHAGOGASTRODUODENOSCOPY (EGD);  Surgeon: Lucilla Lame, MD;  Location: [place];  Service: Gastroenterology;  Laterality: N/A;   ESOPHAGOGASTRODUODENOSCOPY (EGD) WITH PROPOFOL N/A 02/08/2016   Procedure: ESOPHAGOGASTRODUODENOSCOPY (EGD) WITH PROPOFOL;  Surgeon: Lucilla Lame, MD;  Location: [facility];  Service: Endoscopy;  Laterality: N/A;   FASCIOTOMY Right 05/30/2019   Procedure: FASCIOTOMY;  Surgeon: [name], MD;  Location: [facility];  Service: Vascular;  Laterality: Right;   FASCIOTOMY CLOSURE Right 06/04/2019   Procedure: FASCIOTOMY CLOSURE;  Surgeon: [name], MD;  Location: [facility];  Service: Vascular;  Laterality: Right;   HEMORROIDECTOMY  2014   LOWER EXTREMITY ANGIOGRAPHY Right 05/30/2019   Procedure: LOWER EXTREMITY ANGIOGRAPHY;  Surgeon: [name], MD;  Location: [place] [facility];  Service: Cardiovascular;  Laterality: Right;   LOWER EXTREMITY ANGIOGRAPHY Right 07/02/2020   Procedure: LOWER EXTREMITY ANGIOGRAPHY;  Surgeon: [name], MD;  Location: [place] [facility];  Service: Cardiovascular;  Laterality: Right;   PERIPHERAL VASCULAR CATHETERIZATION N/A 02/09/2016   Procedure: Abdominal Aortogram w/Lower Extremity;  Surgeon: [name], MD;  Location: [place] [facility];  Service: Cardiovascular;  Laterality: N/A;   PERIPHERAL VASCULAR CATHETERIZATION Right 02/10/2016   Procedure: Lower Extremity Angiography;  Surgeon: [name], MD;  Location: [facility]  [facility];  Service: Cardiovascular;  Laterality: Right;   POLYPECTOMY  11/05/2017   Procedure: POLYPECTOMY;  Surgeon: Lucilla Lame, MD;  Location: [place];  Service: Endoscopy;;   SHOULDER ARTHROSCOPY WITH ROTATOR CUFF REPAIR AND SUBACROMIAL DECOMPRESSION Right 02/27/2020   Procedure: RIGHT SHOULDER ARTHROSCOPY SUBACROMIAL DECOMPRESSION, DISTAL CLAVICLE EXCISION AND MINI-OPEN ROTATOR CUFF REPAIR;   Surgeon: Thornton Park, MD;  Location: [facility];  Service: Orthopedics;  Laterality: Right;   TONSILLECTOMY     VASCULAR SURGERY  5027,7412   Fem-Pop Bypass   Patient Active Problem List   Diagnosis Date Noted   IgA monoclonal gammopathy 05/18/2022   Degeneration of lumbar intervertebral disc 01/04/2022   Shoulder pain 01/04/2022   Pain in joint of right shoulder 01/04/2022   Spasm of back muscles 01/04/2022   Thoracic back pain 01/04/2022   History of left above knee amputation [facility]) (Dec 2021) 12/27/2021   Rotator cuff arthropathy of right shoulder 12/27/2021   Hx of rotator cuff surgery 12/27/2021   Chronic pain syndrome 12/27/2021   Adhesive capsulitis of right shoulder 12/05/2021   Phantom pain ([place]) 11/10/2021   Muscle spasm 09/09/2020   S/P AKA (above knee amputation), right ([place]) 09/08/2020   Ischemia of extremity 07/02/2020   Chronic pain in right shoulder 11/25/2019   Encounter for long-term (current) use of high-risk medication 07/16/2019   GCA (giant cell arteritis) ([place]) 07/16/2019   Temporal arteritis ([place]) 07/13/2019   Postoperative wound infection 06/23/2019   Ischemic leg 05/31/2019   Atherosclerosis of native arteries of extremity with intermittent claudication ([place]) 05/30/2019   Depression 05/29/2019   Eczema of lower extremity 03/04/2018   Chronic venous insufficiency 03/02/2018   Personal history of colonic polyps    Family history of colonic polyps    Benign neoplasm of ascending colon    Mitral valve insufficiency 02/08/2017   Dyspnea on exertion 02/07/2017   Non-rheumatic mitral regurgitation 02/07/2017   Precordial pain 02/07/2017   CAD (coronary artery disease) 12/21/2016   Essential hypertension 12/21/2016   Compression fracture of lumbar spine, non-traumatic ([place]) 04/26/2016   Compression fracture of L3 lumbar vertebra 04/20/2016   PVD (peripheral vascular disease) ([place]) 02/24/2016   Family history of premature CAD 02/24/2016   Gastritis     Other specified diseases of esophagus    Hiatal hernia    Gastritis and gastroduodenitis    Ischemia of lower extremity    Arterial occlusion ([place])    Atherosclerosis of aorta ([place])    History of smoking    Hyperlipidemia    Pain in the chest    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 02/05/2016   Carotid artery stenosis 01/04/2016   Vertigo, benign paroxysmal 12/28/2015   Clinical depression 09/06/2015   History of alcoholism ([place]) 09/06/2015   Angiopathy, 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[place]) 09/06/2015   Atherosclerosis of native arteries of extremity with rest pain ([place]) 09/06/2015   Acute non-recurrent maxillary sinusitis 07/14/2015   Vaginal pruritus 05/26/2015   Chronic recurrent major depressive disorder ([place]) 03/09/2015   Osteoporosis, post-menopausal 03/09/2015   Peripheral blood vessel disorder ([place]) 03/09/2015   Acid reflux 03/09/2015   GERD (gastroesophageal reflux disease) 12/21/2014   Carotid artery narrowing 01/28/2014   Peripheral arterial occlusive disease ([place]) 06/08/2011   Occlusion and stenosis of unspecified carotid artery 06/08/2011   PAD (peripheral artery disease) ([place]) 06/08/2011   PCP: Danelle Berry, NP  REFERRING PROVIDER: Barrie Lyme MD  REFERRING DIAGNOSIS: S29.012A (ICD-10-CM) - Strain of muscle and tendon of back wall  of thorax, initial encounter  THERAPY DIAG: Muscle weakness (generalized)  Other low back pain  RATIONALE FOR EVALUATION AND TREATMENT: Rehabilitation  ONSET DATE: 08/11/20 (approximate)  FROM INITIAL EVALUATION  SUBJECTIVE:                                                                                                                                                                                         Chief Complaint: R thoracic/rib pain  Pertinent History Pt reports she she started experiencing R shoulder pain which started shortly after her RLE amputation (08/11/20). She attributes the pain to having to  self-propel a wheelchair for an extended period of time. She has seen Dr. Mack Guise at [facility] in the past and underwent a R shoulder mini-open rotator cuff repair, DCE, and SAD on 02/27/20. Since the pain started she has undergone R shoulder injections without improvement in her pain. When pt describes her pain she points to the area below her R shoulder blade around T10/T11. She denies weakness in the RUE or any pain in the area of the [facility] joint or shoulder pain. Denies N/T in back or RUE. Pain worsens with extended use of RUE especially when cleaning the counters in her apartment or sweeping the floor. She has a history of L1 and L3 compression fractures in 2017. She currently sees pain management for her R shoulder pain and takes oxycodone to manage the pain. She also struggles with chronic RLE phantom pain s/p amputation and takes gabapentin for this issue. She has a history of multiple vascular surgeries prior to her amputation and also has a history of carpal tunnel syndrome.  Pain:  Pain Intensity: Not rated today Pain location: R back/flank inferior to the shoulder blade Pain Quality: sharp  Radiating: No  Numbness/Tingling: No Focal Weakness: No Aggravating factors: Excessive use of RUE, mopping the floors, wiping countertops, no pain with driving, Relieving factors: heating pad, never tried lidocaine patch, oxycodone, gabapentin doesn't help with R flank/back pain; 24-hour pain behavior: varies depending on activity History of prior shoulder or neck/shoulder injury, pain, surgery, or therapy: Yes Falls: Has patient fallen in last 6 months? 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  Recent memory is intact.  Remote memory is intact.  Attention span and concentration are intact.  Expressive speech is intact.  Patient's fund of knowledge is within normal limits for educational level.    Gross Musculoskeletal Assessment Tremor: None Bulk: Normal Tone: Normal  Gait Pt ambulates with RLE prosthesis and lofstrand in LUE, decreased self-selected gait speed;  Posture Increased thoracic kyphosis, forward head, and rounded shoulder in sitting and standing;  Cervical Screen AROM: WFL and painless with overpressure in all planes Spurlings A (ipsilateral lateral flexion/axial compression): R: Negative L: Negative Spurlings B (ipsilateral lateral flexion/contralateral rotation/axial compression): R: Negative L: Negative Repeated movement: Not examined Hoffman Sign (cervical cord compression): R: Not examined L: Not examined ULTT Median: R: Not examined L: Not examined ULTT Ulnar: R: Not examined L: Not examined ULTT Radial: R: Not examined L: Not examined  AROM Cervical AROM is painless in all directions and WFL although not full. No reproduction of pain with AROM R shoulder flexion and abduction which is also limited but WFL. No gross deficits noted. Pt denies pain with thoracic flexion, extension, rotation, and lateral flexion.   LE MMT:  MMT (out of 5) Right 05/23/2022 Left 05/23/2022  Cervical (isometric)  Flexion WNL  Extension WNL  Lateral Flexion WNL WNL  Rotation WNL WNL      Shoulder   Flexion 4+ 4+   Extension    Abduction 4+ 4+  External rotation (seated) 4+ 4+  Internal rotation (seated) 4+ 4+  Horizontal abduction    Horizontal adduction    Lower Trapezius    Rhomboids        Elbow  Flexion 5 5  Extension 5 5  Pronation    Supination        Wrist  Flexion 5 5  Extension 5 5  Radial deviation    Ulnar deviation        MCP  Flexion    Extension    Abduction 5 5  Adduction 5 5  (* = pain; Blank rows = not tested)  Sensation Grossly intact to light touch bilateral UE as determined by testing dermatomes C2-T2. Proprioception and hot/cold testing deferred on this date.  Reflexes Deferred  Palpation Pt is nontender to palpation around entire R shoulder girdle anterior, lateral, and posterior. No pain with palpation to R cervical paraspinals, upper trap, rhomboids, or mid trap. She has pain to palpation along spinous processes from T9-L3 as well as along paraspinals in this region on the right.  Repeated Movements Deferred  Passive Accessory Intervertebral Motion Pt reports reproduction of pain with CPA and bilateral UPA T9-L3. Unable to fully assess mobility secondary to pain however appear grossly hypomobile with notable thoracic kyphosis when prone.  SPECIAL TESTS Rotator Cuff  Drop Arm Test: Negative Painful Arc (Pain from 60 to 120 degrees scaption): Negative Infraspinatus Muscle Test: Negative  Subacromial Impingement Hawkins-Kennedy: Negative Neer (Block scapula, PROM flexion): Negative Painful Arc (Pain from 60 to 120 degrees scaption): Negative Empty Can: Negative External Rotation Resistance: Negative Horizontal Adduction: Not examined Scapular Assist: Not examined  Labral Tear Biceps Load II (120 elevation, full ER, 90 elbow flexion, full supination, resisted elbow flexion): Not examined Crank (160 scaption, axial load with IR/ER): Not examined Active Compression Test: Not examined  Bicep Tendon Pathology Speed (shoulder  flexion to 90,  external rotation, full elbow extension, and forearm supination with resistance: Not examined Yergason's (resisted shoulder ER and supination/biceps tendon pathology): Not examined  Shoulder Instability Sulcus Sign: Not examined Anterior Apprehension: Not examined  Beighton scale: Deferred  Outcome Measures Quick DASH: Deferred FOTO: Pt completed shoulder FOTO but this appears to be more related to back pain. Will update at next therapy session.   TODAY'S TREATMENT   SUBJECTIVE: Pt denies back/"R shoulder" pain upon arrival today. She had her PET scan and is now scheduled for a bone marrow biopsy. No specific questions or concerns currently.    PAIN: Denies   Ther-ex: Prone alternating hip extension with manual resistance 2 x 10 BLE; Prone L knee flexion with manual resistance 2 x 10; L sidelying R hip abduction with manual resistance 2 x 10; L sidelying L hip adduction with manual resistance 2 x 10; Supine SLR with manual resistance 2 x 10 BLE; Supine bolster knee bridges 2 x 10; R sidelying L hip abduction with manual resistance 2 x 10; R sidelying R hip adduction with manual resistance 2 x 10; R sidelying L hip clams with manual resistance from therapist 2 x 10; R sidelying L hip reverse clams 2 x 10;   Not performed: Supine R shoulder flexion from neutral to 90 with 2# dumbbell (DB) x 10, 3# DB x 10; Supine R shoulder serratus punch at 90 flexion with 2# DB x 10, 3# DB x 10; Supine R shoulder circles at 90 flexion with 2# DB x 10 each direction, 3# DB x 10 each direction; L sidelying R shoulder ER with 3# DB 2 x 10; L sidleying R shoulder abduction with 3# DB 2 x 10; Prone R shoulder extension with 3# dumbbell (DB) 2 x 10; Prone R low row with 3# DB 2 x 10; Prone R horizontal abduction with 3# DB 2 x 10; Prone R low trap flexion (Y) with 3# DB 2 x 10; Prone R shoulder ER with 3# DB 2 x 10; Seated dynamic hug with blue tband 2 x 10; Seated pallof press with green  tband x 10 toward each side; Seated scapular retractions 2 x 10; Seated PVC chest press with manual resistance 2 x 10; Seated PVC row with manual resistance 2 x 10; Seated overhead shoulder press with 3# dumbbells x 10, x 5, discontinued second set due to increase in thoracic/R flank pain; Seated shoulder flexion with 3# dumbbells 2 x 10; Seated shoulder scaption with 3# dumbbells 2 x 10;   PATIENT EDUCATION:  Education details: Pt educated throughout session about proper posture and technique with exercises. Improved exercise technique, movement at target joints, use of target muscles after min to mod verbal, visual, tactile cues. Person educated: Patient Education method: Explanation, verbal cues, tactile cues, and demonstration Education comprehension: verbalized understanding and returned demonstration;   HOME EXERCISE PROGRAM: None currently   ASSESSMENT:  CLINICAL IMPRESSION: Continued with back/hip/core strengthening during session today. She has experienced considerable improvement in her pain since starting with therapy however she still has notable pain. No pain reported during session today. She is scheduled for a bone marrow biopsy on 05/31/22. Pt encouraged to follow-up as scheduled with therapy. Frequency will be maintained at 1x/wk. She will benefit from PT services to address deficits in strength and pain in order to return to full function at home with less pain.    REHAB POTENTIAL: Fair    CLINICAL DECISION MAKING: Unstable/unpredictable  EVALUATION COMPLEXITY: High  GOALS: Goals reviewed with patient? Yes  SHORT TERM GOALS: Target date:  03/14/2022  Pt will be independent with HEP to improve strength and decrease back pain to improve pain-free function at home. Baseline:  Goal status: INITIAL   LONG TERM GOALS: Target date: 04/11/2022  Pt will increase FOTO to at least 57 in order to demonstrate significant improvement in function at home and work related  to back pain  Baseline: 02/14/22: To be completed at next visit; 02/22/22: 45; 03/29/22: Deferred; 05/23/22: 70 Goal status: ACHIEVED  2.  Pt will decrease worst back pain by at least 2 points on the NPRS in order to demonstrate clinically significant reduction in back pain. Baseline: 02/14/22: To be completed at next visit; 03/29/22: 10/10; 05/23/22: 8/10; Goal status: ACHIEVED  3.  Pt will report at least 50% improvement in her back symptoms in order to demonstrate clinically significant reduction in disability related to back pain;        Baseline: 03/29/22: 80% improvement since starting with therapy; Goal status: ACHIEVED   PLAN: PT FREQUENCY: 1x/week  PT DURATION: 8 weeks  PLANNED INTERVENTIONS: Therapeutic exercises, Therapeutic activity, Neuromuscular re-education, Balance training, Gait training, Patient/Family education, Joint manipulation, Joint mobilization, Vestibular training, Canalith repositioning, Aquatic Therapy, Dry Needling, Electrical stimulation, Spinal manipulation, Spinal mobilization, Cryotherapy, Moist heat, Traction, Ultrasound, Ionotophoresis 6m/ml Dexamethasone, and Manual therapy  PLAN FOR NEXT SESSION: Continue manual techniques for R sided back pain, continue stretching/strengthening;   JPhillips GroutPT, DPT, GCS  Physical Therapist- [facility] 05/23/2022, 9:55 PM

## 2022-05-24 NOTE — OR Nursing (Signed)
Reviewed pre procedure instructions with patient, including NPO after midnight and have driver to take her home. Ok taking am meds with small sip of water

## 2022-05-30 ENCOUNTER — Ambulatory Visit: Payer: Medicare Other

## 2022-05-30 ENCOUNTER — Other Ambulatory Visit (HOSPITAL_COMMUNITY): Payer: Self-pay | Admitting: Student

## 2022-05-30 DIAGNOSIS — M6281 Muscle weakness (generalized): Secondary | ICD-10-CM | POA: Diagnosis not present

## 2022-05-30 DIAGNOSIS — G8929 Other chronic pain: Secondary | ICD-10-CM | POA: Diagnosis not present

## 2022-05-30 DIAGNOSIS — D472 Monoclonal gammopathy: Secondary | ICD-10-CM

## 2022-05-30 DIAGNOSIS — M25511 Pain in right shoulder: Secondary | ICD-10-CM | POA: Diagnosis not present

## 2022-05-30 DIAGNOSIS — M5459 Other low back pain: Secondary | ICD-10-CM

## 2022-05-30 DIAGNOSIS — R262 Difficulty in walking, not elsewhere classified: Secondary | ICD-10-CM | POA: Diagnosis not present

## 2022-05-30 NOTE — H&P (Signed)
Chief Complaint: Patient was seen in consultation today for bone marrow biopsy with aspiration  Referring Physician(s): Hobbs  Supervising Physician: {Supervising Physician:[id number]}  Patient Status: [facility] - Out-pt  History of Present Illness: Sara [name] is a 71 y.o. female with a medical history significant for PAD s/p right AKA, CAD, HTN and chronic back pain. She was referred to Oncology after imaging showed hypermetabolic osseous lesions within the thoracic spine concerning for multiple small osseous  metastases or possibly multiple myeloma. SPEP/UPEP/IFE showed IgA lambda monoclonal protein.   Interventional Radiology has been asked to evaluate this patient for an image-guided bone marrow biopsy with aspiration for further work up.   Past Medical History:  Diagnosis Date   Arthritis    right shoulder   Depression    Dyspnea    GERD (gastroesophageal reflux disease)    History of blood clots    Hyperlipidemia    Hypertension    Mild mitral regurgitation    Osteoporosis    Peripheral vascular disease (HCC)    Stomach ulcer    Vascular disease    Sees Dr. Delana Meyer   Vertigo    Last episode approx Aug 2015   Wears dentures    full upper    Past Surgical History:  Procedure Laterality Date   ADENOIDECTOMY     AMPUTATION Right 08/11/2020   Procedure: AMPUTATION ABOVE KNEE;  Surgeon: [name], MD;  Location: [facility];  Service: Vascular;  Laterality: Right;   ARTERY BIOPSY Right 07/14/2019   Procedure: BIOPSY TEMPORAL ARTERY;  Surgeon: [name], MD;  Location: [facility];  Service: Vascular;  Laterality: Right;   BREAST CYST ASPIRATION Left    CARDIAC CATHETERIZATION  02/15/2017   [facility]   COLONOSCOPY WITH PROPOFOL N/A 10/22/2017   Procedure: COLONOSCOPY WITH PROPOFOL;  Surgeon: Lucilla Lame, MD;  Location: [facility];  Service: Endoscopy;  Laterality: N/A;  specimens not taken--pt on Plavix will be brought back in after 7 days  off med   COLONOSCOPY WITH PROPOFOL N/A 11/05/2017   Procedure: COLONOSCOPY WITH PROPOFOL;  Surgeon: Lucilla Lame, MD;  Location: [place];  Service: Endoscopy;  Laterality: N/A;   ESOPHAGOGASTRODUODENOSCOPY N/A 12/21/2014   Procedure: ESOPHAGOGASTRODUODENOSCOPY (EGD);  Surgeon: Lucilla Lame, MD;  Location: [place];  Service: Gastroenterology;  Laterality: N/A;   ESOPHAGOGASTRODUODENOSCOPY (EGD) WITH PROPOFOL N/A 02/08/2016   Procedure: ESOPHAGOGASTRODUODENOSCOPY (EGD) WITH PROPOFOL;  Surgeon: Lucilla Lame, MD;  Location: [facility];  Service: Endoscopy;  Laterality: N/A;   FASCIOTOMY Right 05/30/2019   Procedure: FASCIOTOMY;  Surgeon: [name], MD;  Location: [facility];  Service: Vascular;  Laterality: Right;   FASCIOTOMY CLOSURE Right 06/04/2019   Procedure: FASCIOTOMY CLOSURE;  Surgeon: [name], MD;  Location: [facility];  Service: Vascular;  Laterality: Right;   HEMORROIDECTOMY  2014   LOWER EXTREMITY ANGIOGRAPHY Right 05/30/2019   Procedure: LOWER EXTREMITY ANGIOGRAPHY;  Surgeon: [name], MD;  Location: [place] [facility];  Service: Cardiovascular;  Laterality: Right;   LOWER EXTREMITY ANGIOGRAPHY Right 07/02/2020   Procedure: LOWER EXTREMITY ANGIOGRAPHY;  Surgeon: [name], MD;  Location: [place] [facility];  Service: Cardiovascular;  Laterality: Right;   PERIPHERAL VASCULAR CATHETERIZATION N/A 02/09/2016   Procedure: Abdominal Aortogram w/Lower Extremity;  Surgeon: [name], MD;  Location: [place] [facility];  Service: Cardiovascular;  Laterality: N/A;   PERIPHERAL VASCULAR CATHETERIZATION Right 02/10/2016   Procedure: Lower Extremity Angiography;  Surgeon: [name], MD;  Location: [place] [facility];  Service: Cardiovascular;  Laterality: Right;   POLYPECTOMY  11/05/2017   Procedure: POLYPECTOMY;  Surgeon: Lucilla Lame, MD;  Location: [place];  Service: Endoscopy;;   SHOULDER ARTHROSCOPY WITH  ROTATOR CUFF REPAIR AND SUBACROMIAL DECOMPRESSION Right 02/27/2020   Procedure: RIGHT SHOULDER ARTHROSCOPY SUBACROMIAL DECOMPRESSION, DISTAL CLAVICLE EXCISION AND MINI-OPEN ROTATOR CUFF REPAIR;  Surgeon: Thornton Park, MD;  Location: [facility];  Service: Orthopedics;  Laterality: Right;   TONSILLECTOMY     VASCULAR SURGERY  1324,4010   Fem-Pop Bypass    Allergies: Hydrocodone-acetaminophen, Amoxicillin, Metoprolol tartrate, Oxycodone, and Vicodin [hydrocodone-acetaminophen]  Medications: Prior to Admission medications   Medication Sig Start Date End Date Taking? Authorizing Provider  alendronate (FOSAMAX) 10 MG tablet Take 10 mg by mouth daily before breakfast. Take with a full glass of water on an empty stomach.    [provider]  apixaban (ELIQUIS) 5 MG TABS tablet Take 1 tablet (5 mg total) by mouth 2 (two) times daily. 10/16/21 10/11/22  Schnier, Dolores Lory, MD  atorvastatin (LIPITOR) 40 MG tablet Take 40 mg by mouth at bedtime.  12/08/16   [provider]  calcium carbonate (OSCAL) 1500 (600 Ca) MG TABS tablet Take 1,200 mg of elemental calcium by mouth 2 (two) times daily with a meal.    [provider]  cholecalciferol (VITAMIN D3) 25 MCG (1000 UNIT) tablet Take 1,000 Units by mouth daily.    [provider]  digoxin (LANOXIN) 0.25 MG tablet Take by mouth. 04/18/22   [provider]  DULoxetine (CYMBALTA) 60 MG capsule Take 60 mg by mouth 2 (two) times daily. 01/04/21   [provider]  gabapentin (NEURONTIN) 800 MG tablet Take 1 tablet (800 mg total) by mouth 3 (three) times daily. 02/16/22 06/16/22  Gillis Santa, MD  losartan (COZAAR) 25 MG tablet Take 25 mg by mouth every morning.  12/01/16   [provider]  omeprazole (PRILOSEC) 20 MG capsule Take 20 mg by mouth daily.    [provider]  oxyCODONE-acetaminophen (PERCOCET) 7.5-325 MG tablet one tablet po q 8-12 hours as needed for chronic pain. 05/01/22   [provider]  Tocilizumab (ACTEMRA ACTPEN) 162 MG/0.9ML SOAJ Inject 162 mg into the skin once a week.     [provider]  zolpidem (AMBIEN) 5 MG tablet Take 5 mg by mouth at bedtime as needed for sleep.  07/03/19   [provider]     Family History  Problem Relation Age of Onset   CVA Mother    Heart attack Mother    Cancer Father        colon cancer   Colon cancer Father    Heart disease Maternal Uncle    Breast cancer Neg Hx     Social History   Socioeconomic History   Marital status: Single    Spouse name: Not on file   Number of children: Not on file   Years of education: Not on file   Highest education level: Not on file  Occupational History   Not on file  Tobacco Use   Smoking status: Former    Packs/day: 2.00    Years: 25.00    Total pack years: 50.00    Types: Cigarettes    Quit date: 08/22/1991    Years since quitting: 30.7   Smokeless tobacco: Never  Vaping Use   Vaping Use: Never used  Substance and Sexual Activity   Alcohol use: No  Alcohol/week: 0.0 standard drinks of alcohol   Drug use: Never   Sexual activity: Not Currently  Other Topics Concern   Not on file  Social History Narrative   Not on file   Social Determinants of Health   Financial Resource Strain: Not on file  Food Insecurity: Not on file  Transportation Needs: Not on file  Physical Activity: Not on file  Stress: Not on file  Social Connections: Not on file    Review of Systems: A 12 point ROS discussed and pertinent positives are indicated in the HPI above.  All other systems are negative.  Review of Systems  Vital Signs: There were no vitals taken for this visit.  Physical Exam  Imaging: NM PET Image Initial (PI) Skull Base To Thigh  Result Date: 05/18/2022 CLINICAL DATA:  Initial treatment strategy for reported thoracic spine lesions on outside MRI. EXAM: NUCLEAR MEDICINE PET SKULL BASE TO THIGH TECHNIQUE: 6.8 mCi F-18 FDG was injected  intravenously. Full-ring PET imaging was performed from the skull base to thigh after the radiotracer. CT data was obtained and used for attenuation correction and anatomic localization. Fasting blood glucose: 125 mg/dl COMPARISON:  CT scan 02/05/2016 FINDINGS: Mediastinal blood pool activity: SUV max 2.0 Liver activity: SUV max NA NECK: No significant abnormal hypermetabolic activity in this region. Incidental CT findings: Left common carotid atherosclerotic calcification. CHEST: Small hilar and infrahilar foci of activity, for example maximum SUV 2.8 in the right hilum 3.0 in the right infrahilar region, potentially representing mildly hypermetabolic small lymph nodes, although no pathologically enlarged lymph nodes are identified on the CT data. Incidental CT findings: Coronary, aortic arch, and branch vessel atherosclerotic vascular disease. Borderline elevated right hemidiaphragm. Centrilobular emphysema. Mild dependent subsegmental atelectasis in both lungs. ABDOMEN/PELVIS: No significant abnormal hypermetabolic activity in this region. Incidental CT findings: Atherosclerosis is present, including aortoiliac atherosclerotic disease. Prominent stool throughout the colon favors constipation. SKELETON: Scattered small foci osseous hypermetabolic activity noted for example in the right medial clavicle (maximum SUV 2.4), left medial clavicle (maximum SUV 3.0), right sternal manubrium (maximum SUV 3.2), and sternal body (maximum SUV 3.2). These lesions are fairly occult on the CT data. Other faint foci of accentuated metabolic activity are observed in the thoracolumbar spine, for example a T4 posterior vertebral body lesion with maximum SUV 2.3, but are relatively indistinct. Incidental CT findings: Various compression fractures are identified including T11, L1, and L3. IMPRESSION: 1. There are a few scattered small mildly hypermetabolic bony lesions primarily noted in the sternum and distal clavicles, questionably  in the thoracolumbar spine. The thoracic spine MRI examination prompting today's PET-CT is not available, but given that findings were concerning for bony malignancy, this must be considered suspicious. I do not see a separate primary source for metastatic disease to the skeleton; serologic workup to exclude myeloma is suggested. 2. Small foci of hilar and infrahilar activity, mildly above background activity. No visible enlarged lymph nodes. These hilar and infrahilar nodes may be reactive. 3. Other imaging findings of potential clinical significance: Aortic Atherosclerosis (ICD10-I70.0) and Emphysema (ICD10-J43.9). Coronary atherosclerosis. Prominent stool throughout the colon favors constipation. Compression fractures at T11, L1, and L3. Electronically Signed   By: Van Clines M.D.   On: 05/18/2022 09:22   VAS [place] ABI WITH/WO TBI  Result Date: 05/15/2022  LOWER EXTREMITY DOPPLER STUDY Patient Name:  Stormey Wilborn  Date of Exam:   05/01/2022 Medical Rec #: [id number]  Accession #:    [id number] Date of Birth: 12-10-1950              Patient Gender: F Patient Age:   35 years Exam Location:  [facility] Procedure:      VAS [place] ABI WITH/WO TBI Referring Phys: [name] --------------------------------------------------------------------------------  Indications: Rest pain, and peripheral artery disease. High Risk Factors: Hypertension.  Vascular Interventions: 02/10/2016 PTA of right femoral to popliteal autogenous                         bypass graft. PTA right distal posterior tibial artery.                         12/2008 and 02/1990 Right femoral to popliteal autogenous                         bypass graft.                          05/30/2019: PTA and Stent placement at 2 locations in                         the Femoral below knee poplteal bypass. Mechanical                         Thrombectomy Right Popliteal bypass and TibioPeroneal                         trunk and  Proximal Peroneal Artery. Infusion of TPA to                         the Right Femoral-Popliteal Bypass.                          05/30/2019: Compartment Syndrome Right Lower Extremity                         Status Post Revascularization.                          06/04/2019: Compartment Syndrome Status Post                         Fasciotomies. Comparison Study: 08/01/2021 Performing Technologist: Almira Coaster RVS  Examination Guidelines: A complete evaluation includes at minimum, Doppler waveform signals and systolic blood pressure reading at the level of bilateral brachial, anterior tibial, and posterior tibial arteries, when vessel segments are accessible. Bilateral testing is considered an integral part of a complete examination. Photoelectric Plethysmograph (PPG) waveforms and toe systolic pressure readings are included as required and additional duplex testing as needed. Limited examinations for reoccurring indications may be performed as noted.  ABI Findings: +--------+------------------+-----+--------+--------+ Right   Rt Pressure (mmHg)IndexWaveformComment  +--------+------------------+-----+--------+--------+ Brachial109                                     +--------+------------------+-----+--------+--------+ +---------+------------------+-----+---------+-------+ Left     Lt Pressure (mmHg)IndexWaveform Comment +---------+------------------+-----+---------+-------+ Brachial 120                                     +---------+------------------+-----+---------+-------+  ATA      144               1.20 biphasic         +---------+------------------+-----+---------+-------+ PTA      141               1.18 triphasic        +---------+------------------+-----+---------+-------+ Great Toe96                0.80 Normal           +---------+------------------+-----+---------+-------+ +-------+-----------+-----------+------------+------------+ ABI/TBIToday's ABIToday's  TBIPrevious ABIPrevious TBI +-------+-----------+-----------+------------+------------+ Right  AKA                   AKA                      +-------+-----------+-----------+------------+------------+ Left   1.20       .80        1.16        .71          +-------+-----------+-----------+------------+------------+ Left ABIs appear essentially unchanged compared to prior study on 08/01/2021. Left TBIs appear increased compared to prior study on 08/01/2021.  Summary: Left: Resting left ankle-brachial index is within normal range. The left toe-brachial index is normal. *See table(s) above for measurements and observations.   Electronically signed by [name] MD on 05/15/2022 at 5:27:44 PM.    Final     Labs:  CBC: Recent Labs    06/24/21 1139 04/21/22 1143  WBC 5.3 6.9  HGB 13.7 14.3  HCT 42.1 44.3  PLT 163 182    COAGS: No results for input(s): "INR", "APTT" in the last 8760 hours.  BMP: Recent Labs    06/24/21 1139 04/21/22 1143  NA 136 136  K 3.8 4.5  CL 102 102  CO2 26 27  GLUCOSE 105* 103*  BUN 15 9  CALCIUM 8.7* 8.7*  CREATININE 0.80 0.76  GFRNONAA >60 >60    LIVER FUNCTION TESTS: Recent Labs    06/24/21 1139 04/21/22 1143  BILITOT 0.4 0.4  AST 26 32  ALT 23 36  ALKPHOS 54 57  PROT 6.5 6.6  ALBUMIN 3.9 4.0    TUMOR MARKERS: No results for input(s): "AFPTM", "CEA", "CA199", "CHROMGRNA" in the last 8760 hours.  Assessment and Plan:  Osseous thoracic lesions concerning for multiple myeloma; IgA lambda monoclonal protein: Sara [name], 71 year old female, presents today to the [facility] Interventional Radiology department for an image-guided bone marrow biopsy with aspiration.   Risks and benefits of this procedure were discussed with the patient and/or patient's family including, but not limited to bleeding, infection, damage to adjacent structures or low yield requiring additional tests.  All of the questions  were answered and there is agreement to proceed. She has been NPO.   Consent signed and in chart.  Thank you for this interesting consult.  I greatly enjoyed meeting Sara [name] and look forward to participating in their care.  A copy of this report was sent to the requesting provider on this date.  Electronically Signed: Soyla Dryer, AGACNP-BC [phone] 05/30/2022, 10:45 AM   I spent a total of  30 Minutes   in face to face in clinical consultation, greater than 50% of which was counseling/coordinating care for bone marrow biopsy with aspiration.

## 2022-05-31 ENCOUNTER — Ambulatory Visit
Admission: RE | Admit: 2022-05-31 | Discharge: 2022-05-31 | Disposition: A | Discharge status: Home / Self Care | Payer: Medicare Other | Source: Ambulatory Visit | Attending: Internal Medicine | Admitting: Internal Medicine

## 2022-05-31 DIAGNOSIS — I739 Peripheral vascular disease, unspecified: Secondary | ICD-10-CM | POA: Insufficient documentation

## 2022-05-31 DIAGNOSIS — M549 Dorsalgia, unspecified: Secondary | ICD-10-CM | POA: Insufficient documentation

## 2022-05-31 DIAGNOSIS — Z89611 Acquired absence of right leg above knee: Secondary | ICD-10-CM | POA: Diagnosis not present

## 2022-05-31 DIAGNOSIS — M899 Disorder of bone, unspecified: Secondary | ICD-10-CM | POA: Diagnosis not present

## 2022-05-31 DIAGNOSIS — Z1379 Encounter for other screening for genetic and chromosomal anomalies: Secondary | ICD-10-CM | POA: Diagnosis not present

## 2022-05-31 DIAGNOSIS — G8929 Other chronic pain: Secondary | ICD-10-CM | POA: Insufficient documentation

## 2022-05-31 DIAGNOSIS — I10 Essential (primary) hypertension: Secondary | ICD-10-CM | POA: Diagnosis not present

## 2022-05-31 DIAGNOSIS — D472 Monoclonal gammopathy: Secondary | ICD-10-CM

## 2022-05-31 DIAGNOSIS — I2510 Atherosclerotic heart disease of native coronary artery without angina pectoris: Secondary | ICD-10-CM | POA: Insufficient documentation

## 2022-05-31 LAB — CBC WITH DIFFERENTIAL/PLATELET
Abs Immature Granulocytes: 0.05 10*3/uL (ref 0.00–0.07)
Basophils Absolute: 0.1 10*3/uL (ref 0.0–0.1)
Basophils Relative: 1 %
Eosinophils Absolute: 0.3 10*3/uL (ref 0.0–0.5)
Eosinophils Relative: 3 %
HCT: 48.4 % — ABNORMAL HIGH (ref 36.0–46.0)
Hemoglobin: 15.6 g/dL — ABNORMAL HIGH (ref 12.0–15.0)
Immature Granulocytes: 1 %
Lymphocytes Relative: 12 %
Lymphs Abs: 1.1 10*3/uL (ref 0.7–4.0)
MCH: 31.1 pg (ref 26.0–34.0)
MCHC: 32.2 g/dL (ref 30.0–36.0)
MCV: 96.4 fL (ref 80.0–100.0)
Monocytes Absolute: 1.2 10*3/uL — ABNORMAL HIGH (ref 0.1–1.0)
Monocytes Relative: 13 %
Neutro Abs: 6.6 10*3/uL (ref 1.7–7.7)
Neutrophils Relative %: 70 %
Platelets: 207 10*3/uL (ref 150–400)
RBC: 5.02 MIL/uL (ref 3.87–5.11)
RDW: 14.2 % (ref 11.5–15.5)
WBC: 9.4 10*3/uL (ref 4.0–10.5)
nRBC: 0 % (ref 0.0–0.2)

## 2022-05-31 MED ORDER — MIDAZOLAM HCL 2 MG/2ML IJ SOLN
INTRAMUSCULAR | Status: AC
  Filled 2022-05-31: qty 2

## 2022-05-31 MED ORDER — HEPARIN SOD (PORK) LOCK FLUSH 100 UNIT/ML IV SOLN
INTRAVENOUS | Status: AC
  Filled 2022-05-31: qty 5

## 2022-05-31 MED ORDER — SODIUM CHLORIDE 0.9 % IV SOLN: INTRAVENOUS | Status: DC

## 2022-05-31 MED ORDER — MIDAZOLAM HCL 2 MG/2ML IJ SOLN
INTRAMUSCULAR | Status: AC | PRN
  Administered 2022-05-31: 1 mg via INTRAVENOUS

## 2022-05-31 MED ORDER — FENTANYL CITRATE (PF) 100 MCG/2ML IJ SOLN
INTRAMUSCULAR | Status: AC | PRN
  Administered 2022-05-31 (×2): 50 ug via INTRAVENOUS

## 2022-05-31 MED ORDER — FENTANYL CITRATE (PF) 100 MCG/2ML IJ SOLN
INTRAMUSCULAR | Status: AC
  Filled 2022-05-31: qty 2

## 2022-05-31 NOTE — Therapy (Addendum)
OUTPATIENT PHYSICAL THERAPY SHOULDER/BACK TREATMENT  Patient Name: Sara [name] MRN: [id number] DOB:11-02-1950, 71 y.o., female Today's Date: 05/31/2022   PT End of Session - 05/30/22 1222     Visit Number 19    Number of Visits 33    Date for PT Re-Evaluation 07/18/22    Authorization Type eval: 11/02/95, recert: 09/27/35    PT Start Time 1110    PT Stop Time 1200    PT Time Calculation (min) 50 min    Activity Tolerance Patient tolerated treatment well    Behavior During Therapy [facility] for tasks assessed/performed            Past Medical History:  Diagnosis Date   Arthritis    right shoulder   Depression    Dyspnea    GERD (gastroesophageal reflux disease)    History of blood clots    Hyperlipidemia    Hypertension    Mild mitral regurgitation    Osteoporosis    Peripheral vascular disease ([employer])    Stomach ulcer    Vascular disease    Sees Dr. Delana Meyer   Vertigo    Last episode approx Aug 2015   Wears dentures    full upper   Past Surgical History:  Procedure Laterality Date   ADENOIDECTOMY     AMPUTATION Right 08/11/2020   Procedure: AMPUTATION ABOVE KNEE;  Surgeon: [name], MD;  Location: [facility];  Service: Vascular;  Laterality: Right;   ARTERY BIOPSY Right 07/14/2019   Procedure: BIOPSY TEMPORAL ARTERY;  Surgeon: [name], MD;  Location: [facility];  Service: Vascular;  Laterality: Right;   BREAST CYST ASPIRATION Left    CARDIAC CATHETERIZATION  02/15/2017   [facility]   COLONOSCOPY WITH PROPOFOL N/A 10/22/2017   Procedure: COLONOSCOPY WITH PROPOFOL;  Surgeon: Lucilla Lame, MD;  Location: [place];  Service: Endoscopy;  Laterality: N/A;  specimens not taken--pt on Plavix will be brought back in after 7 days off med   COLONOSCOPY WITH PROPOFOL N/A 11/05/2017   Procedure: COLONOSCOPY WITH PROPOFOL;  Surgeon: Lucilla Lame, MD;  Location: [place];  Service: Endoscopy;  Laterality: N/A;   ESOPHAGOGASTRODUODENOSCOPY N/A  12/21/2014   Procedure: ESOPHAGOGASTRODUODENOSCOPY (EGD);  Surgeon: Lucilla Lame, MD;  Location: [place];  Service: Gastroenterology;  Laterality: N/A;   ESOPHAGOGASTRODUODENOSCOPY (EGD) WITH PROPOFOL N/A 02/08/2016   Procedure: ESOPHAGOGASTRODUODENOSCOPY (EGD) WITH PROPOFOL;  Surgeon: Lucilla Lame, MD;  Location: [facility];  Service: Endoscopy;  Laterality: N/A;   FASCIOTOMY Right 05/30/2019   Procedure: FASCIOTOMY;  Surgeon: [name], MD;  Location: [facility];  Service: Vascular;  Laterality: Right;   FASCIOTOMY CLOSURE Right 06/04/2019   Procedure: FASCIOTOMY CLOSURE;  Surgeon: [name], MD;  Location: [facility];  Service: Vascular;  Laterality: Right;   HEMORROIDECTOMY  2014   LOWER EXTREMITY ANGIOGRAPHY Right 05/30/2019   Procedure: LOWER EXTREMITY ANGIOGRAPHY;  Surgeon: [name], MD;  Location: [place] CV [facility];  Service: Cardiovascular;  Laterality: Right;   LOWER EXTREMITY ANGIOGRAPHY Right 07/02/2020   Procedure: LOWER EXTREMITY ANGIOGRAPHY;  Surgeon: [name], MD;  Location: [place] [facility];  Service: Cardiovascular;  Laterality: Right;   PERIPHERAL VASCULAR CATHETERIZATION N/A 02/09/2016   Procedure: Abdominal Aortogram w/Lower Extremity;  Surgeon: [name], MD;  Location: [place] [facility];  Service: Cardiovascular;  Laterality: N/A;   PERIPHERAL VASCULAR CATHETERIZATION Right 02/10/2016   Procedure: Lower Extremity Angiography;  Surgeon: [name], MD;  Location: [facility] INVASIVE [facility]  [facility];  Service: Cardiovascular;  Laterality: Right;   POLYPECTOMY  11/05/2017   Procedure: POLYPECTOMY;  Surgeon: Lucilla Lame, MD;  Location: [place];  Service: Endoscopy;;   SHOULDER ARTHROSCOPY WITH ROTATOR CUFF REPAIR AND SUBACROMIAL DECOMPRESSION Right 02/27/2020   Procedure: RIGHT SHOULDER ARTHROSCOPY SUBACROMIAL DECOMPRESSION, DISTAL CLAVICLE EXCISION AND MINI-OPEN ROTATOR CUFF REPAIR;  Surgeon: Thornton Park, MD;   Location: [facility];  Service: Orthopedics;  Laterality: Right;   TONSILLECTOMY     VASCULAR SURGERY  [id number]   Fem-Pop Bypass   Patient Active Problem List   Diagnosis Date Noted   IgA monoclonal gammopathy 05/18/2022   Degeneration of lumbar intervertebral disc 01/04/2022   Shoulder pain 01/04/2022   Pain in joint of right shoulder 01/04/2022   Spasm of back muscles 01/04/2022   Thoracic back pain 01/04/2022   History of left above knee amputation [facility]) (Dec 2021) 12/27/2021   Rotator cuff arthropathy of right shoulder 12/27/2021   Hx of rotator cuff surgery 12/27/2021   Chronic pain syndrome 12/27/2021   Adhesive capsulitis of right shoulder 12/05/2021   Phantom pain ([place]) 11/10/2021   Muscle spasm 09/09/2020   S/P AKA (above knee amputation), right ([place]) 09/08/2020   Ischemia of extremity 07/02/2020   Chronic pain in right shoulder 11/25/2019   Encounter for long-term (current) use of high-risk medication 07/16/2019   GCA (giant cell arteritis) ([place]) 07/16/2019   Temporal arteritis ([place]) 07/13/2019   Postoperative wound infection 06/23/2019   Ischemic leg 05/31/2019   Atherosclerosis of native arteries of extremity with intermittent claudication ([place]) 05/30/2019   Depression 05/29/2019   Eczema of lower extremity 03/04/2018   Chronic venous insufficiency 03/02/2018   Personal history of colonic polyps    Family history of colonic polyps    Benign neoplasm of ascending colon    Mitral valve insufficiency 02/08/2017   Dyspnea on exertion 02/07/2017   Non-rheumatic mitral regurgitation 02/07/2017   Precordial pain 02/07/2017   CAD (coronary artery disease) 12/21/2016   Essential hypertension 12/21/2016   Compression fracture of lumbar spine, non-traumatic ([place]) 04/26/2016   Compression fracture of L3 lumbar vertebra 04/20/2016   PVD (peripheral vascular disease) ([place]) 02/24/2016   Family history of premature CAD 02/24/2016   Gastritis    Other specified diseases of  esophagus    Hiatal hernia    Gastritis and gastroduodenitis    Ischemia of lower extremity    Arterial occlusion ([place])    Atherosclerosis of aorta ([place])    History of smoking    Hyperlipidemia    Pain in the chest    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 02/05/2016   Carotid artery stenosis 01/04/2016   Vertigo, benign paroxysmal 12/28/2015   Clinical depression 09/06/2015   History of alcoholism ([place]) 09/06/2015   Angiopathy, 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[place]) 09/06/2015   Atherosclerosis of native arteries of extremity with rest pain ([place]) 09/06/2015   Acute non-recurrent maxillary sinusitis 07/14/2015   Vaginal pruritus 05/26/2015   Chronic recurrent major depressive disorder ([place]) 03/09/2015   Osteoporosis, post-menopausal 03/09/2015   Peripheral blood vessel disorder ([place]) 03/09/2015   Acid reflux 03/09/2015   GERD (gastroesophageal reflux disease) 12/21/2014   Carotid artery narrowing 01/28/2014   Peripheral arterial occlusive disease ([place]) 06/08/2011   Occlusion and stenosis of unspecified carotid artery 06/08/2011   PAD (peripheral artery disease) ([place]) 06/08/2011   PCP: Danelle Berry, NP  REFERRING PROVIDER: Barrie Lyme MD  REFERRING DIAGNOSIS: S29.012A (ICD-10-CM) - Strain of muscle and tendon of back wall  of thorax, initial encounter  THERAPY DIAG: Muscle weakness (generalized)  Other low back pain  Chronic right shoulder pain  RATIONALE FOR EVALUATION AND TREATMENT: Rehabilitation  ONSET DATE: 08/11/20 (approximate)  FROM INITIAL EVALUATION  SUBJECTIVE:                                                                                                                                                                                         Chief Complaint: R thoracic/rib pain  Pertinent History Pt reports she she started experiencing R shoulder pain which started shortly after her RLE amputation (08/11/20). She attributes the pain to having to  self-propel a wheelchair for an extended period of time. She has seen Dr. Mack Guise at [facility] in the past and underwent a R shoulder mini-open rotator cuff repair, DCE, and SAD on 02/27/20. Since the pain started she has undergone R shoulder injections without improvement in her pain. When pt describes her pain she points to the area below her R shoulder blade around T10/T11. She denies weakness in the RUE or any pain in the area of the [facility] joint or shoulder pain. Denies N/T in back or RUE. Pain worsens with extended use of RUE especially when cleaning the counters in her apartment or sweeping the floor. She has a history of L1 and L3 compression fractures in 2017. She currently sees pain management for her R shoulder pain and takes oxycodone to manage the pain. She also struggles with chronic RLE phantom pain s/p amputation and takes gabapentin for this issue. She has a history of multiple vascular surgeries prior to her amputation and also has a history of carpal tunnel syndrome.  Pain:  Pain Intensity: Not rated today Pain location: R back/flank inferior to the shoulder blade Pain Quality: sharp  Radiating: No  Numbness/Tingling: No Focal Weakness: No Aggravating factors: Excessive use of RUE, mopping the floors, wiping countertops, no pain with driving, Relieving factors: heating pad, never tried lidocaine patch, oxycodone, gabapentin doesn't help with R flank/back pain; 24-hour pain behavior: varies depending on activity History of prior shoulder or neck/shoulder injury, pain, surgery, or therapy: Yes Falls: Has patient fallen in last 6 months? 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  Recent memory is intact.  Remote memory is intact.  Attention span and concentration are intact.  Expressive speech is intact.  Patient's fund of knowledge is within normal limits for educational level.    Gross Musculoskeletal Assessment Tremor: None Bulk: Normal Tone: Normal  Gait Pt ambulates with RLE prosthesis and lofstrand in LUE, decreased self-selected gait speed;  Posture Increased thoracic kyphosis, forward head, and rounded shoulder in sitting and standing;  Cervical Screen AROM: WFL and painless with overpressure in all planes Spurlings A (ipsilateral lateral flexion/axial compression): R: Negative L: Negative Spurlings B (ipsilateral lateral flexion/contralateral rotation/axial compression): R: Negative L: Negative Repeated movement: Not examined Hoffman Sign (cervical cord compression): R: Not examined L: Not examined ULTT Median: R: Not examined L: Not examined ULTT Ulnar: R: Not examined L: Not examined ULTT Radial: R: Not examined L: Not examined  AROM Cervical AROM is painless in all directions and WFL although not full. No reproduction of pain with AROM R shoulder flexion and abduction which is also limited but WFL. No gross deficits noted. Pt denies pain with thoracic flexion, extension, rotation, and lateral flexion.   LE MMT:  MMT (out of 5) Right 05/31/2022 Left 05/31/2022  Cervical (isometric)  Flexion WNL  Extension WNL  Lateral Flexion WNL WNL  Rotation WNL WNL      Shoulder   Flexion 4+ 4+   Extension    Abduction 4+ 4+  External rotation (seated) 4+ 4+  Internal rotation (seated) 4+ 4+  Horizontal abduction    Horizontal adduction    Lower Trapezius    Rhomboids        Elbow  Flexion 5 5  Extension 5 5  Pronation    Supination        Wrist  Flexion 5 5  Extension 5 5  Radial deviation    Ulnar deviation        MCP  Flexion    Extension    Abduction 5 5  Adduction 5 5  (* = pain; Blank rows = not tested)  Sensation Grossly intact to light touch bilateral UE as determined by testing dermatomes C2-T2. Proprioception and hot/cold testing deferred on this date.  Reflexes Deferred  Palpation Pt is nontender to palpation around entire R shoulder girdle anterior, lateral, and posterior. No pain with palpation to R cervical paraspinals, upper trap, rhomboids, or mid trap. She has pain to palpation along spinous processes from T9-L3 as well as along paraspinals in this region on the right.  Repeated Movements Deferred  Passive Accessory Intervertebral Motion Pt reports reproduction of pain with CPA and bilateral UPA T9-L3. Unable to fully assess mobility secondary to pain however appear grossly hypomobile with notable thoracic kyphosis when prone.  SPECIAL TESTS Rotator Cuff  Drop Arm Test: Negative Painful Arc (Pain from 60 to 120 degrees scaption): Negative Infraspinatus Muscle Test: Negative  Subacromial Impingement Hawkins-Kennedy: Negative Neer (Block scapula, PROM flexion): Negative Painful Arc (Pain from 60 to 120 degrees scaption): Negative Empty Can: Negative External Rotation Resistance: Negative Horizontal Adduction: Not examined Scapular Assist: Not examined  Labral Tear Biceps Load II (120 elevation, full ER, 90 elbow flexion, full supination, resisted elbow flexion): Not examined Crank (160 scaption, axial load with IR/ER): Not examined Active Compression Test: Not examined  Bicep  Tendon Pathology Speed (shoulder flexion to 90,  external rotation, full elbow extension, and forearm supination with resistance: Not examined Yergason's (resisted shoulder ER and supination/biceps tendon pathology): Not examined  Shoulder Instability Sulcus Sign: Not examined Anterior Apprehension: Not examined  Beighton scale: Deferred  Outcome Measures Quick DASH: Deferred FOTO: Pt completed shoulder FOTO but this appears to be more related to back pain. Will update at next therapy session.   TODAY'S TREATMENT   SUBJECTIVE: Pt reports she is scheduled for a bone marrow biopsy later this week.  She also is having a lot of pain in her limb when she wears her prosthesis.  She states she has an appointment with her prosthetist on Friday to discuss this too. No specific questions or concerns currently.    PAIN: Denies   Ther-ex: Prone alternating hip extension with manual resistance 2 x 10 BLE; Prone L knee flexion with manual resistance 2 x 10; L sidelying R hip abduction with manual resistance 2 x 10; L sidelying L hip adduction with manual resistance 2 x 10; Supine SLR with manual resistance 2 x 10 BLE; Supine bolster knee bridges 2 x 10; R sidelying L hip abduction with manual resistance 2 x 10; R sidelying R hip adduction with manual resistance 2 x 10; Seated shoulder scaption with 3# dumbbells 2 x 10; Seated PVC chest press with manual resistance 2 x 10; Seated PVC row with manual resistance 2 x 10;  Not performed: R sidelying L hip clams with manual resistance from therapist 2 x 10; R sidelying L hip reverse clams 2 x 10; Supine R shoulder flexion from neutral to 90 with 2# dumbbell (DB) x 10, 3# DB x 10; Supine R shoulder serratus punch at 90 flexion with 2# DB x 10, 3# DB x 10; Supine R shoulder circles at 90 flexion with 2# DB x 10 each direction, 3# DB x 10 each direction; L sidelying R shoulder ER with 3# DB 2 x 10; L sidleying R shoulder abduction with 3# DB 2 x 10; Prone R shoulder extension with 3# dumbbell  (DB) 2 x 10; Prone R low row with 3# DB 2 x 10; Prone R horizontal abduction with 3# DB 2 x 10; Prone R low trap flexion (Y) with 3# DB 2 x 10; Prone R shoulder ER with 3# DB 2 x 10; Seated dynamic hug with blue tband 2 x 10; Seated pallof press with green tband x 10 toward each side; Seated scapular retractions 2 x 10;  Seated overhead shoulder press with 3# dumbbells x 10, x 5, discontinued second set due to increase in thoracic/R flank pain; Seated shoulder flexion with 3# dumbbells 2 x 10;    PATIENT EDUCATION:  Education details: Pt educated throughout session about proper posture and technique with exercises. Improved exercise technique, movement at target joints, use of target muscles after min to mod verbal, visual, tactile cues. Person educated: Patient Education method: Explanation, verbal cues, tactile cues, and demonstration Education comprehension: verbalized understanding and returned demonstration;   HOME EXERCISE PROGRAM: None currently   ASSESSMENT:  CLINICAL IMPRESSION: Continued with back/hip/core strengthening during session today. Pt had a little more difficulty participating in therapeutic exercises due to experiencing "cramping" in her LE's during strengthening.  She also required extra time to amb out of session due to report of significant discomfort with her prosthesis which resulted in unsafe gait with heavy lean on her single Lofstrand crutch.  She declined PT recommendation to use FWW for safety  to amb out of clinic.  PT walked with her out to the car for safety.  Frequency will be maintained at 1x/wk. She will benefit from PT services to address deficits in strength and pain in order to return to full function at home with less pain.    REHAB POTENTIAL: Fair    CLINICAL DECISION MAKING: Unstable/unpredictable  EVALUATION COMPLEXITY: High   GOALS: Goals reviewed with patient? Yes  SHORT TERM GOALS: Target date:  03/14/2022  Pt will be independent  with HEP to improve strength and decrease back pain to improve pain-free function at home. Baseline:  Goal status: INITIAL   LONG TERM GOALS: Target date: 04/11/2022  Pt will increase FOTO to at least 57 in order to demonstrate significant improvement in function at home and work related to back pain  Baseline: 02/14/22: To be completed at next visit; 02/22/22: 45; 03/29/22: Deferred; 05/23/22: 70 Goal status: ACHIEVED  2.  Pt will decrease worst back pain by at least 2 points on the NPRS in order to demonstrate clinically significant reduction in back pain. Baseline: 02/14/22: To be completed at next visit; 03/29/22: 10/10; 05/23/22: 8/10; Goal status: ACHIEVED  3.  Pt will report at least 50% improvement in her back symptoms in order to demonstrate clinically significant reduction in disability related to back pain;        Baseline: 03/29/22: 80% improvement since starting with therapy; Goal status: ACHIEVED   PLAN: PT FREQUENCY: 1x/week  PT DURATION: 8 weeks  PLANNED INTERVENTIONS: Therapeutic exercises, Therapeutic activity, Neuromuscular re-education, Balance training, Gait training, Patient/Family education, Joint manipulation, Joint mobilization, Vestibular training, Canalith repositioning, Aquatic Therapy, Dry Needling, Electrical stimulation, Spinal manipulation, Spinal mobilization, Cryotherapy, Moist heat, Traction, Ultrasound, Ionotophoresis 25m/ml Dexamethasone, and Manual therapy  PLAN FOR NEXT SESSION: Continue manual techniques for R sided back pain, continue stretching/strengthening;   RMerdis Delay PT, DPT, OCS  #[phone] Physical Therapist- [facility] 05/31/2022, 12:25 PM

## 2022-05-31 NOTE — Procedures (Signed)
Interventional Radiology Procedure Note  Procedure: CT guided bone marrow aspiration and biopsy  Complications: None  EBL: < 10 mL  Findings: Aspirate and core biopsy performed of bone marrow in right iliac bone.  Plan: Bedrest supine x 1 hrs  Denise Bramblett T. Angelize Ryce, M.D Pager:  [phone]   

## 2022-06-01 DIAGNOSIS — I2510 Atherosclerotic heart disease of native coronary artery without angina pectoris: Secondary | ICD-10-CM | POA: Diagnosis not present

## 2022-06-01 DIAGNOSIS — I10 Essential (primary) hypertension: Secondary | ICD-10-CM | POA: Diagnosis not present

## 2022-06-01 DIAGNOSIS — R7301 Impaired fasting glucose: Secondary | ICD-10-CM | POA: Diagnosis not present

## 2022-06-01 DIAGNOSIS — I340 Nonrheumatic mitral (valve) insufficiency: Secondary | ICD-10-CM | POA: Diagnosis not present

## 2022-06-01 DIAGNOSIS — E785 Hyperlipidemia, unspecified: Secondary | ICD-10-CM | POA: Diagnosis not present

## 2022-06-02 DIAGNOSIS — F411 Generalized anxiety disorder: Secondary | ICD-10-CM | POA: Diagnosis not present

## 2022-06-02 DIAGNOSIS — L308 Other specified dermatitis: Secondary | ICD-10-CM | POA: Diagnosis not present

## 2022-06-02 DIAGNOSIS — M810 Age-related osteoporosis without current pathological fracture: Secondary | ICD-10-CM | POA: Diagnosis not present

## 2022-06-02 DIAGNOSIS — K219 Gastro-esophageal reflux disease without esophagitis: Secondary | ICD-10-CM | POA: Diagnosis not present

## 2022-06-06 ENCOUNTER — Ambulatory Visit: Payer: Medicare Other

## 2022-06-06 ENCOUNTER — Encounter: Payer: Self-pay | Admitting: Internal Medicine

## 2022-06-06 DIAGNOSIS — M6281 Muscle weakness (generalized): Secondary | ICD-10-CM

## 2022-06-06 DIAGNOSIS — M5459 Other low back pain: Secondary | ICD-10-CM | POA: Diagnosis not present

## 2022-06-06 DIAGNOSIS — R262 Difficulty in walking, not elsewhere classified: Secondary | ICD-10-CM | POA: Diagnosis not present

## 2022-06-06 DIAGNOSIS — M25511 Pain in right shoulder: Secondary | ICD-10-CM | POA: Diagnosis not present

## 2022-06-06 DIAGNOSIS — G8929 Other chronic pain: Secondary | ICD-10-CM | POA: Diagnosis not present

## 2022-06-06 LAB — SURGICAL PATHOLOGY

## 2022-06-06 NOTE — Therapy (Unsigned)
OUTPATIENT PHYSICAL THERAPY SHOULDER TREATMENT/PROGRESS NOTE  Dates of reporting period  03/29/22   to   06/06/22   Patient Name: Sara [name] MRN: [id number] DOB:[birth year], 71 y.o., female Today's Date: 06/07/2022   PT End of Session - 06/06/22 1108     Visit Number 20    Number of Visits 33    Date for PT Re-Evaluation 07/18/22    Authorization Type eval: 0/63/01, recert: 60/1/09    PT Start Time 1110    PT Stop Time 1145    PT Time Calculation (min) 35 min    Activity Tolerance Patient tolerated treatment well    Behavior During Therapy [facility] for tasks assessed/performed            Past Medical History:  Diagnosis Date   Arthritis    right shoulder   Depression    Dyspnea    GERD (gastroesophageal reflux disease)    History of blood clots    Hyperlipidemia    Hypertension    Mild mitral regurgitation    Osteoporosis    Peripheral vascular disease ([place])    Stomach ulcer    Vascular disease    Sees Dr. Delana Meyer   Vertigo    Last episode approx Aug 2015   Wears dentures    full upper   Past Surgical History:  Procedure Laterality Date   ADENOIDECTOMY     AMPUTATION Right 08/11/2020   Procedure: AMPUTATION ABOVE KNEE;  Surgeon: [name], MD;  Location: [facility];  Service: Vascular;  Laterality: Right;   ARTERY BIOPSY Right 07/14/2019   Procedure: BIOPSY TEMPORAL ARTERY;  Surgeon: [name], MD;  Location: [facility];  Service: Vascular;  Laterality: Right;   BREAST CYST ASPIRATION Left    CARDIAC CATHETERIZATION  02/15/2017   [facility]   COLONOSCOPY WITH PROPOFOL N/A 10/22/2017   Procedure: COLONOSCOPY WITH PROPOFOL;  Surgeon: Lucilla Lame, MD;  Location: [place];  Service: Endoscopy;  Laterality: N/A;  specimens not taken--pt on Plavix will be brought back in after 7 days off med   COLONOSCOPY WITH PROPOFOL N/A 11/05/2017   Procedure: COLONOSCOPY WITH PROPOFOL;  Surgeon: Lucilla Lame, MD;  Location: [place];  Service:  Endoscopy;  Laterality: N/A;   ESOPHAGOGASTRODUODENOSCOPY N/A 12/21/2014   Procedure: ESOPHAGOGASTRODUODENOSCOPY (EGD);  Surgeon: Lucilla Lame, MD;  Location: [place];  Service: Gastroenterology;  Laterality: N/A;   ESOPHAGOGASTRODUODENOSCOPY (EGD) WITH PROPOFOL N/A 02/08/2016   Procedure: ESOPHAGOGASTRODUODENOSCOPY (EGD) WITH PROPOFOL;  Surgeon: Lucilla Lame, MD;  Location: [facility];  Service: Endoscopy;  Laterality: N/A;   FASCIOTOMY Right 05/30/2019   Procedure: FASCIOTOMY;  Surgeon: [name], MD;  Location: [facility];  Service: Vascular;  Laterality: Right;   FASCIOTOMY CLOSURE Right 06/04/2019   Procedure: FASCIOTOMY CLOSURE;  Surgeon: [name], MD;  Location: [facility];  Service: Vascular;  Laterality: Right;   HEMORROIDECTOMY  2014   LOWER EXTREMITY ANGIOGRAPHY Right 05/30/2019   Procedure: LOWER EXTREMITY ANGIOGRAPHY;  Surgeon: [name], MD;  Location: [place] [facility];  Service: Cardiovascular;  Laterality: Right;   LOWER EXTREMITY ANGIOGRAPHY Right 07/02/2020   Procedure: LOWER EXTREMITY ANGIOGRAPHY;  Surgeon: [name], MD;  Location: [place] [facility];  Service: Cardiovascular;  Laterality: Right;   PERIPHERAL VASCULAR CATHETERIZATION N/A 02/09/2016   Procedure: Abdominal Aortogram w/Lower Extremity;  Surgeon: [name], MD;  Location: [place] [facility];  Service: Cardiovascular;  Laterality: N/A;   PERIPHERAL VASCULAR CATHETERIZATION Right 02/10/2016  Procedure: Lower Extremity Angiography;  Surgeon: [name], MD;  Location: [place] [facility];  Service: Cardiovascular;  Laterality: Right;   POLYPECTOMY  11/05/2017   Procedure: POLYPECTOMY;  Surgeon: Lucilla Lame, MD;  Location: [place];  Service: Endoscopy;;   SHOULDER ARTHROSCOPY WITH ROTATOR CUFF REPAIR AND SUBACROMIAL DECOMPRESSION Right 02/27/2020   Procedure: RIGHT SHOULDER ARTHROSCOPY SUBACROMIAL DECOMPRESSION, DISTAL CLAVICLE EXCISION AND  MINI-OPEN ROTATOR CUFF REPAIR;  Surgeon: Thornton Park, MD;  Location: [facility];  Service: Orthopedics;  Laterality: Right;   TONSILLECTOMY     VASCULAR SURGERY  [id number]   Fem-Pop Bypass   Patient Active Problem List   Diagnosis Date Noted   IgA monoclonal gammopathy 05/18/2022   Degeneration of lumbar intervertebral disc 01/04/2022   Shoulder pain 01/04/2022   Pain in joint of right shoulder 01/04/2022   Spasm of back muscles 01/04/2022   Thoracic back pain 01/04/2022   History of left above knee amputation [facility]) (Dec 2021) 12/27/2021   Rotator cuff arthropathy of right shoulder 12/27/2021   Hx of rotator cuff surgery 12/27/2021   Chronic pain syndrome 12/27/2021   Adhesive capsulitis of right shoulder 12/05/2021   Phantom pain ([place]) 11/10/2021   Muscle spasm 09/09/2020   S/P AKA (above knee amputation), right ([place]) 09/08/2020   Ischemia of extremity 07/02/2020   Chronic pain in right shoulder 11/25/2019   Encounter for long-term (current) use of high-risk medication 07/16/2019   GCA (giant cell arteritis) ([place]) 07/16/2019   Temporal arteritis ([place]) 07/13/2019   Postoperative wound infection 06/23/2019   Ischemic leg 05/31/2019   Atherosclerosis of native arteries of extremity with intermittent claudication ([place]) 05/30/2019   Depression 05/29/2019   Eczema of lower extremity 03/04/2018   Chronic venous insufficiency 03/02/2018   Personal history of colonic polyps    Family history of colonic polyps    Benign neoplasm of ascending colon    Mitral valve insufficiency 02/08/2017   Dyspnea on exertion 02/07/2017   Non-rheumatic mitral regurgitation 02/07/2017   Precordial pain 02/07/2017   CAD (coronary artery disease) 12/21/2016   Essential hypertension 12/21/2016   Compression fracture of lumbar spine, non-traumatic ([place]) 04/26/2016   Compression fracture of L3 lumbar vertebra 04/20/2016   PVD (peripheral vascular disease) ([place]) 02/24/2016   Family history of premature  CAD 02/24/2016   Gastritis    Other specified diseases of esophagus    Hiatal hernia    Gastritis and gastroduodenitis    Ischemia of lower extremity    Arterial occlusion ([place])    Atherosclerosis of aorta ([place])    History of smoking    Hyperlipidemia    Pain in the chest    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 02/05/2016   Carotid artery stenosis 01/04/2016   Vertigo, benign paroxysmal 12/28/2015   Clinical depression 09/06/2015   History of alcoholism ([place]) 09/06/2015   Angiopathy, 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[place]) 09/06/2015   Atherosclerosis of native arteries of extremity with rest pain ([place]) 09/06/2015   Acute non-recurrent maxillary sinusitis 07/14/2015   Vaginal pruritus 05/26/2015   Chronic recurrent major depressive disorder ([place]) 03/09/2015   Osteoporosis, post-menopausal 03/09/2015   Peripheral blood vessel disorder ([place]) 03/09/2015   Acid reflux 03/09/2015   GERD (gastroesophageal reflux disease) 12/21/2014   Carotid artery narrowing 01/28/2014   Peripheral arterial occlusive disease ([place]) 06/08/2011   Occlusion and stenosis of unspecified carotid artery 06/08/2011   PAD (peripheral artery disease) ([place]) 06/08/2011   PCP: Danelle Berry, NP  REFERRING PROVIDER: Barrie Lyme  MD  REFERRING DIAGNOSIS: S29.012A (ICD-10-CM) - Strain of muscle and tendon of back wall of thorax, initial encounter  THERAPY DIAG: Muscle weakness (generalized)  RATIONALE FOR EVALUATION AND TREATMENT: Rehabilitation  ONSET DATE: 08/11/20 (approximate)  FROM INITIAL EVALUATION  SUBJECTIVE:                                                                                                                                                                                         Chief Complaint: R thoracic/rib pain  Pertinent History Pt reports she she started experiencing R shoulder pain which started shortly after her RLE amputation (08/11/20). She attributes the pain to  having to self-propel a wheelchair for an extended period of time. She has seen Dr. Mack Guise at [facility] in the past and underwent a R shoulder mini-open rotator cuff repair, DCE, and SAD on 02/27/20. Since the pain started she has undergone R shoulder injections without improvement in her pain. When pt describes her pain she points to the area below her R shoulder blade around T10/T11. She denies weakness in the RUE or any pain in the area of the [facility] joint or shoulder pain. Denies N/T in back or RUE. Pain worsens with extended use of RUE especially when cleaning the counters in her apartment or sweeping the floor. She has a history of L1 and L3 compression fractures in 2017. She currently sees pain management for her R shoulder pain and takes oxycodone to manage the pain. She also struggles with chronic RLE phantom pain s/p amputation and takes gabapentin for this issue. She has a history of multiple vascular surgeries prior to her amputation and also has a history of carpal tunnel syndrome.  Pain:  Pain Intensity: Not rated today Pain location: R back/flank inferior to the shoulder blade Pain Quality: sharp  Radiating: No  Numbness/Tingling: No Focal Weakness: No Aggravating factors: Excessive use of RUE, mopping the floors, wiping countertops, no pain with driving, Relieving factors: heating pad, never tried lidocaine patch, oxycodone, gabapentin doesn't help with R flank/back pain; 24-hour pain behavior: varies depending on activity History of prior shoulder or neck/shoulder injury, pain, surgery, or therapy: Yes Falls: Has patient fallen in last 6 months? 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  Recent memory is intact.  Remote memory is intact.  Attention span and concentration are intact.  Expressive speech is intact.  Patient's fund of knowledge is within normal limits for educational level.    Gross Musculoskeletal Assessment Tremor: None Bulk: Normal Tone: Normal  Gait Pt ambulates with RLE prosthesis and lofstrand in LUE, decreased self-selected gait speed;  Posture Increased thoracic kyphosis, forward head, and rounded shoulder in sitting and standing;  Cervical Screen AROM: WFL and painless with overpressure in all planes Spurlings A (ipsilateral lateral flexion/axial compression): R: Negative L: Negative Spurlings B (ipsilateral lateral flexion/contralateral rotation/axial compression): R: Negative L: Negative Repeated movement: Not examined Hoffman Sign (cervical cord compression): R: Not examined L: Not examined ULTT Median: R: Not examined L: Not examined ULTT Ulnar: R: Not examined L: Not examined ULTT Radial: R: Not examined L: Not examined  AROM Cervical AROM is painless in all directions and WFL although not full. No reproduction of pain with AROM R shoulder flexion and abduction which is also limited but WFL. No gross deficits noted. Pt denies pain with thoracic flexion, extension, rotation, and lateral flexion.   LE MMT:  MMT (out of 5) Right 06/07/2022 Left 06/07/2022  Cervical (isometric)  Flexion WNL  Extension WNL  Lateral Flexion WNL WNL  Rotation WNL WNL      Shoulder   Flexion 4+  4+  Extension    Abduction 4+ 4+  External rotation (seated) 4+ 4+  Internal rotation (seated) 4+ 4+  Horizontal abduction    Horizontal adduction    Lower Trapezius    Rhomboids        Elbow  Flexion 5 5  Extension 5 5  Pronation    Supination        Wrist  Flexion 5 5  Extension 5 5  Radial deviation    Ulnar deviation        MCP  Flexion    Extension    Abduction 5 5  Adduction 5 5  (* = pain; Blank rows = not tested)  Sensation Grossly intact to light touch bilateral UE as determined by testing dermatomes C2-T2. Proprioception and hot/cold testing deferred on this date.  Reflexes Deferred  Palpation Pt is nontender to palpation around entire R shoulder girdle anterior, lateral, and posterior. No pain with palpation to R cervical paraspinals, upper trap, rhomboids, or mid trap. She has pain to palpation along spinous processes from T9-L3 as well as along paraspinals in this region on the right.  Repeated Movements Deferred  Passive Accessory Intervertebral Motion Pt reports reproduction of pain with CPA and bilateral UPA T9-L3. Unable to fully assess mobility secondary to pain however appear grossly hypomobile with notable thoracic kyphosis when prone.  SPECIAL TESTS Rotator Cuff  Drop Arm Test: Negative Painful Arc (Pain from 60 to 120 degrees scaption): Negative Infraspinatus Muscle Test: Negative  Subacromial Impingement Hawkins-Kennedy: Negative Neer (Block scapula, PROM flexion): Negative Painful Arc (Pain from 60 to 120 degrees scaption): Negative Empty Can: Negative External Rotation Resistance: Negative Horizontal Adduction: Not examined Scapular Assist: Not examined  Labral Tear Biceps Load II (120 elevation, full ER, 90 elbow flexion, full supination, resisted elbow flexion): Not examined Crank (160 scaption, axial load with IR/ER): Not examined Active  Compression Test: Not examined  Bicep Tendon Pathology Speed (shoulder flexion to 90,  external rotation, full elbow extension, and forearm supination with resistance: Not examined Yergason's (resisted shoulder ER and supination/biceps tendon pathology): Not examined  Shoulder Instability Sulcus Sign: Not examined Anterior Apprehension: Not examined  Beighton scale: Deferred  Outcome Measures Quick DASH: Deferred FOTO: Pt completed shoulder FOTO but this appears to be more related to back pain. Will update at next therapy session.   TODAY'S TREATMENT   SUBJECTIVE: Pt reports she is doing alright today. She denies any resting pain upon arrival but continues to have R flank pain when using her RUE. She also is having a lot of pain in her R residual limb when she wears her prosthesis. No specific questions currently.    PAIN: Denies   Ther-ex Prone alternating hip extension with manual resistance x 10 BLE; Supine SLR with manual resistance x 10 BLE; Supine bolster knee bridges 2 x 10; Seated shoulder flexion with 3# dumbbells 2 x 10; Nautilus lat pull down 60# 3 x 10;   Manual Therapy  Prone moist heat pack applied to R flank during interval history x 5 minutes; Prone STM to R flank with gentle effleurage;   Not performed: Supine R shoulder flexion from neutral to 90 with 2# dumbbell (DB) x 10, 3# DB x 10; Supine R shoulder serratus punch at 90 flexion with 2# DB x 10, 3# DB x 10; Supine R shoulder circles at 90 flexion with 2# DB x 10 each direction, 3# DB x 10 each direction; L sidelying R shoulder ER with 3# DB 2 x 10; L sidleying R shoulder abduction with 3# DB 2 x 10; R sidelying L hip abduction with manual resistance 2 x 10; R sidelying R hip adduction with manual resistance 2 x 10; R sidelying L hip clams with manual resistance from therapist 2 x 10; R sidelying L hip reverse clams 2 x 10; Prone R shoulder extension with 3# dumbbell (DB) 2 x 10; Prone R low row with 3# DB 2 x 10; Prone R horizontal abduction with 3# DB 2 x 10; Prone R low trap  flexion (Y) with 3# DB 2 x 10; Prone R shoulder ER with 3# DB 2 x 10; Prone L knee flexion with manual resistance 2 x 10 Seated dynamic hug with blue tband 2 x 10; Seated pallof press with green tband x 10 toward each side; Seated scapular retractions 2 x 10; Seated overhead shoulder press with 3# dumbbells x 10, x 5, discontinued second set due to increase in thoracic/R flank pain; Seated shoulder flexion with 3# dumbbells 2 x 10; Seated PVC chest press with manual resistance 2 x 10; Seated PVC row with manual resistance 2 x 10;    PATIENT EDUCATION:  Education details: Pt educated throughout session about proper posture and technique with exercises. Improved exercise technique, movement at target joints, use of target muscles after min to mod verbal, visual, tactile cues. Person educated: Patient Education method: Explanation, verbal cues, tactile cues, and demonstration Education comprehension: verbalized understanding and returned demonstration;   HOME EXERCISE PROGRAM: None currently   ASSESSMENT:  CLINICAL IMPRESSION: Continued with back/hip/core strengthening during session today. Outcome measures last updated on 05/23/22 so deferred today. At that time her FOTO had improved to 70 and her worst back/flank pain had decreased to 8/10. Her back/flank pain now mostly only occurs when she is using her RUE. She reports approximately 80% improvement in her symptoms since starting  with therapy. She is having more instability recently as well as more pain in her R residual limb. Pt encouraged to contact prosthetist to see if she can move up her appointment. She almost experienced a fall in the clinic today when ambulating with a single lofstrand crutch. Pt encouraged to transition back to a front wheeled walker until she sees the prosthetist. PT walked with her out to the car for safety.  Frequency will be maintained at 1x/wk. She will benefit from PT services to address deficits in strength  and pain in order to return to full function at home with less pain.    REHAB POTENTIAL: Fair    CLINICAL DECISION MAKING: Unstable/unpredictable  EVALUATION COMPLEXITY: High   GOALS: Goals reviewed with patient? Yes  SHORT TERM GOALS: Target date:  03/14/2022  Pt will be independent with HEP to improve strength and decrease back pain to improve pain-free function at home. Baseline:  Goal status: ONGOING   LONG TERM GOALS: Target date: 04/11/2022  Pt will increase FOTO to at least 57 in order to demonstrate significant improvement in function at home and work related to back pain  Baseline: 02/14/22: To be completed at next visit; 02/22/22: 45; 03/29/22: Deferred; 05/23/22: 70 Goal status: ACHIEVED  2.  Pt will decrease worst back pain by at least 2 points on the NPRS in order to demonstrate clinically significant reduction in back pain. Baseline: 02/14/22: To be completed at next visit; 03/29/22: 10/10; 05/23/22: 8/10; Goal status: ACHIEVED  3.  Pt will report at least 50% improvement in her back symptoms in order to demonstrate clinically significant reduction in disability related to back pain;        Baseline: 03/29/22: 80% improvement since starting with therapy; Goal status: ACHIEVED   PLAN: PT FREQUENCY: 1x/week  PT DURATION: 8 weeks  PLANNED INTERVENTIONS: Therapeutic exercises, Therapeutic activity, Neuromuscular re-education, Balance training, Gait training, Patient/Family education, Joint manipulation, Joint mobilization, Vestibular training, Canalith repositioning, Aquatic Therapy, Dry Needling, Electrical stimulation, Spinal manipulation, Spinal mobilization, Cryotherapy, Moist heat, Traction, Ultrasound, Ionotophoresis '4mg'$ /ml Dexamethasone, and Manual therapy  PLAN FOR NEXT SESSION: Add HEP for LE strengthening, continue manual techniques for R sided back pain and include LE strengthening and gait training,    Phillips Grout PT, DPT, GCS  Physical Therapist- [facility]  06/07/2022, 10:00 AM

## 2022-06-07 ENCOUNTER — Inpatient Hospital Stay: Payer: Medicare Other | Attending: Internal Medicine | Admitting: Hospice and Palliative Medicine

## 2022-06-07 DIAGNOSIS — G8929 Other chronic pain: Secondary | ICD-10-CM | POA: Insufficient documentation

## 2022-06-07 DIAGNOSIS — D472 Monoclonal gammopathy: Secondary | ICD-10-CM

## 2022-06-07 DIAGNOSIS — M899 Disorder of bone, unspecified: Secondary | ICD-10-CM | POA: Insufficient documentation

## 2022-06-07 NOTE — Progress Notes (Signed)
Multidisciplinary Oncology Council Documentation  Sara [name] was presented by our [facility] on 06/07/2022, which included representatives from:  Palliative Care Dietitian  Physical/Occupational Therapist Nurse Navigator Genetics Speech Therapist Social work Survivorship RN Financial Navigator Research RN   Sara [name] currently presents with history of hypermetabolic bone lesions  We reviewed previous medical and familial history, history of present illness, and recent lab results along with all available histopathologic and imaging studies. The [place] considered available treatment options and made the following recommendations/referrals:  None currently  The MOC is a meeting of clinicians from various specialty areas who evaluate and discuss patients for whom a multidisciplinary approach is being considered. Final determinations in the plan of care are those of the provider(s).   Today's extended care, comprehensive team conference, Sara [name] was not present for the discussion and was not examined.

## 2022-06-08 ENCOUNTER — Inpatient Hospital Stay (HOSPITAL_BASED_OUTPATIENT_CLINIC_OR_DEPARTMENT_OTHER): Payer: Medicare Other | Admitting: Internal Medicine

## 2022-06-08 ENCOUNTER — Ambulatory Visit
Payer: Medicare Other | Attending: Student in an Organized Health Care Education/Training Program | Admitting: Student in an Organized Health Care Education/Training Program

## 2022-06-08 ENCOUNTER — Encounter: Payer: Self-pay | Admitting: Internal Medicine

## 2022-06-08 ENCOUNTER — Encounter: Payer: Self-pay | Admitting: Student in an Organized Health Care Education/Training Program

## 2022-06-08 VITALS — BP 132/78 | HR 97 | Temp 98.6°F | Resp 16 | Wt 131.0 lb

## 2022-06-08 VITALS — BP 132/78 | HR 108 | Temp 98.6°F | Resp 18 | Ht 63.0 in | Wt 123.0 lb

## 2022-06-08 DIAGNOSIS — M12811 Other specific arthropathies, not elsewhere classified, right shoulder: Secondary | ICD-10-CM | POA: Diagnosis not present

## 2022-06-08 DIAGNOSIS — Z9889 Other specified postprocedural states: Secondary | ICD-10-CM | POA: Insufficient documentation

## 2022-06-08 DIAGNOSIS — M47894 Other spondylosis, thoracic region: Secondary | ICD-10-CM | POA: Diagnosis not present

## 2022-06-08 DIAGNOSIS — M899 Disorder of bone, unspecified: Secondary | ICD-10-CM

## 2022-06-08 DIAGNOSIS — G8929 Other chronic pain: Secondary | ICD-10-CM | POA: Diagnosis not present

## 2022-06-08 DIAGNOSIS — G894 Chronic pain syndrome: Secondary | ICD-10-CM | POA: Diagnosis not present

## 2022-06-08 DIAGNOSIS — D472 Monoclonal gammopathy: Secondary | ICD-10-CM | POA: Diagnosis not present

## 2022-06-08 NOTE — Progress Notes (Signed)
PROVIDER NOTE: Information contained herein reflects review and annotations entered in association with encounter. Interpretation of such information and data should be left to medically-trained personnel. Information provided to patient can be located elsewhere in the medical record under "Patient Instructions". Document created using STT-dictation technology, any transcriptional errors that may result from process are unintentional.    Patient: Sara [name]  Service Category: E/M  Provider: Gillis Santa, MD  DOB: 08/05/1951  DOS: 06/08/2022  Referring Provider: Danelle Berry, NP  MRN: [id number]  Specialty: Interventional Pain Management  PCP: Danelle Berry, NP  Type: Established Patient  Setting: Ambulatory outpatient    Location: Office  Delivery: Face-to-face     HPI   [name], a 71 y.o. 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 [name] primary complain today is right thoracic pain which radiates into right Shoulder Pain (right) Last encounter: My last encounter with her was on 04/26/2022. Pertinent problems: [name] has Phantom pain [facility]); Rotator cuff arthropathy of right shoulder; Hx of rotator cuff surgery; Chronic pain syndrome; Thoracic back pain; and Thoracic facet joint syndrome on their pertinent problem list. Pain Assessment: Severity of Chronic pain is reported as a 10-Worst pain ever/10. Location: Shoulder Right/denies. Onset: More than a month ago. Quality:  . Timing: Constant. Modifying factor(s): heating pad. Vitals:  height is 5' 3"  (1.6 m) and weight is 123 lb (55.8 kg). Her temporal temperature is 98.6 F (37 C). Her blood pressure is 132/78 and her pulse is 108 (abnormal). Her respiration is 18 and oxygen saturation is 94%.   Reason for encounter:   Raivyn presents today for right thoracic pain with occasional radiation into her right periscapular region.  This is related to thoracic facet  joint arthropathy as well as T11 compression deformity that is likely causing associated pain.  She states that she is doing exceptionally well after her lumbar facet medial branch nerve blocks that were done over the summer.  She states that she has no low back pain at this moment.  She has tried to do stretching exercises in her thoracic region however these have not been very helpful.  ROS  Constitutional: Denies any fever or chills Gastrointestinal: No reported hemesis, hematochezia, vomiting, or acute GI distress Musculoskeletal:  Right thoracic pain Neurological: No reported episodes of acute onset apraxia, aphasia, dysarthria, agnosia, amnesia, paralysis, loss of coordination, or loss of consciousness  Medication Review  DULoxetine, Tocilizumab, alendronate, apixaban, atorvastatin, calcium carbonate, cholecalciferol, digoxin, gabapentin, losartan, omeprazole, oxyCODONE-acetaminophen, and zolpidem  History Review  Allergy: [name] is allergic to hydrocodone-acetaminophen, amoxicillin, metoprolol tartrate, oxycodone, and vicodin [hydrocodone-acetaminophen]. Drug: [name]  reports no history of drug use. Alcohol:  reports no history of alcohol use. Tobacco:  reports that she quit smoking about 30 years ago. Her smoking use included cigarettes. She has a 50.00 pack-year smoking history. She has never used smokeless tobacco. Social: [name]  reports that she quit smoking about 30 years ago. Her smoking use included cigarettes. She has a 50.00 pack-year smoking history. She has never used smokeless tobacco. She reports that she does not drink alcohol and does not use drugs. Medical:  has a past medical history of Arthritis, Depression, Dyspnea, GERD (gastroesophageal reflux disease), History of blood clots, Hyperlipidemia, Hypertension, Mild mitral regurgitation, Osteoporosis, Peripheral vascular disease ([place]), Stomach ulcer, Vascular disease, Vertigo, and Wears dentures. Surgical: Ms.  Sara [name]  has a past surgical history that includes Vascular surgery (  [id number]); Hemorroidectomy (2014); Esophagogastroduodenoscopy (N/A, 12/21/2014); Esophagogastroduodenoscopy (egd) with propofol (N/A, 02/08/2016); Cardiac catheterization (N/A, 02/09/2016); Cardiac catheterization (Right, 02/10/2016); Cardiac catheterization (02/15/2017); Colonoscopy with propofol (N/A, 10/22/2017); Colonoscopy with propofol (N/A, 11/05/2017); polypectomy (11/05/2017); Tonsillectomy; Adenoidectomy; Lower Extremity Angiography (Right, 05/30/2019); Fasciotomy closure (Right, 06/04/2019); Artery Biopsy (Right, 07/14/2019); Fasciotomy (Right, 05/30/2019); Breast cyst aspiration (Left); Shoulder arthroscopy with rotator cuff repair and subacromial decompression (Right, 02/27/2020); Lower Extremity Angiography (Right, 07/02/2020); and Amputation (Right, 08/11/2020). Family: family history includes CVA in her mother; Cancer in her father; Colon cancer in her father; Heart attack in her mother; Heart disease in her maternal uncle.  Laboratory Chemistry Profile   Renal Lab Results  Component Value Date   BUN 9 04/21/2022   CREATININE 0.76 04/21/2022   BCR 13 09/21/2015   GFRAA >60 02/18/2020   GFRNONAA >60 04/21/2022    Hepatic Lab Results  Component Value Date   AST 32 04/21/2022   ALT 36 04/21/2022   ALBUMIN 4.0 04/21/2022   ALKPHOS 57 04/21/2022    Electrolytes Lab Results  Component Value Date   NA 136 04/21/2022   K 4.5 04/21/2022   CL 102 04/21/2022   CALCIUM 8.7 (L) 04/21/2022   MG 1.9 07/03/2020   PHOS 3.6 06/02/2019    Bone Lab Results  Component Value Date   VD25OH 45.8 09/21/2015    Inflammation (CRP: Acute Phase) (ESR: Chronic Phase) Lab Results  Component Value Date   ESRSEDRATE 58 (H) 07/03/2019   LATICACIDVEN 0.8 06/01/2019         Note: Above Lab results reviewed.  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  EXAM: CT GUIDED BONE MARROW ASPIRATION AND BIOPSY  ANESTHESIA/SEDATION: Moderate (conscious) sedation was employed during this procedure. A total of Versed 1.0 mg and Fentanyl 100 mcg was administered intravenously by radiology nursing.  Moderate Sedation Time: 15 minutes. The patient's level of consciousness and vital signs were monitored continuously by radiology nursing throughout the procedure under my direct supervision.  PROCEDURE: The procedure risks, benefits, and alternatives were explained to the patient. Questions regarding the procedure were encouraged and answered. The patient understands and consents to the procedure. A time out was performed prior to initiating the procedure.  The right gluteal region was prepped with chlorhexidine. Sterile gown and sterile gloves were used for the procedure. Local anesthesia was provided with 1% Lidocaine.  Under CT guidance, an 11 gauge On Control bone cutting needle was advanced from a posterior approach into the right iliac bone. Needle positioning was confirmed with CT. Initial non heparinized and heparinized aspirate samples were obtained of bone marrow. Core biopsy was performed via the On Control drill needle.  COMPLICATIONS: None  FINDINGS: Inspection of initial aspirate did reveal visible particles. Intact core biopsy sample was obtained.  IMPRESSION: CT guided bone marrow biopsy of right posterior iliac bone with both aspirate and core samples obtained.  Electronically Signed   By: Aletta Edouard M.D.   On: 05/31/2022 12:36 Note: Reviewed        Physical Exam  General appearance: Well nourished, well developed, and well hydrated. In no apparent acute distress Mental status: Alert, oriented x 3 (person, place, & time)       Respiratory: No evidence of acute respiratory distress Eyes: PERLA Vitals: BP 132/78   Pulse (!) 108   Temp 98.6 F (37 C) (Temporal)   Resp 18   Ht 5' 3"  (1.6  m)   Wt  123 lb (55.8 kg)   SpO2 94%   BMI 21.79 kg/m  BMI: Estimated body mass index is 21.79 kg/m as calculated from the following:   Height as of this encounter: 5' 3"  (1.6 m).   Weight as of this encounter: 123 lb (55.8 kg). Ideal: Ideal body weight: 52.4 kg (115 lb 8.3 oz) Adjusted ideal body weight: 53.8 kg (118 lb 8.2 oz)  Thoracic Spine Area Exam  Skin & Axial Inspection: No masses, redness, or swelling Alignment: Symmetrical Functional ROM: Pain restricted ROM Stability: No instability detected Muscle Tone/Strength: Functionally intact. No obvious neuro-muscular anomalies detected. Sensory (Neurological): Musculoskeletal pain pattern, facet mediated Muscle strength & Tone: Complains of area being tender to palpation  Assessment   Diagnosis Status  1. Thoracic facet joint syndrome   2. Rotator cuff arthropathy of right shoulder   3. Hx of rotator cuff surgery   4. Chronic pain syndrome    Controlled Controlled Controlled   Updated Problems: Problem  Thoracic Facet Joint Syndrome  Thoracic Back Pain  Rotator Cuff Arthropathy of Right Shoulder  Hx of Rotator Cuff Surgery  Chronic Pain Syndrome  Phantom Pain ([facility])  History of left above knee amputation [facility]) (Dec 2021)     Plan of [name] has a history of greater than 3 months of moderate to severe thoracic pain which is resulted in functional impairment.  The patient has tried various conservative therapeutic options such as NSAIDs, Tylenol, muscle relaxants, physician directed physical therapy which was inadequately effective.  Patient's pain is predominantly thoracic with physical exam and xray findings suggestive of right thoracic facet arthropathy.  Right thoracic facet medial branch nerve blocks were discussed with the patient.  Risks and benefits were reviewed.  Patient would like to proceed.    Orders:  Orders Placed This Encounter  Procedures   THORACIC FACET BLOCK    Standing Status:    Future    Standing Expiration Date:   09/08/2022    Scheduling Instructions:     Thoracic Medial Branch Block     Side: right     Sedation:  IV Versed     Timeframe: asap    Order Specific Question:   Where will this procedure be performed?    Answer:   [facility]   Follow-up plan:   Return in about 2 weeks (around 06/22/2022) for Left thoracic facet MBB, in clinic IV Versed (stop eliquis 3 days before).     1.  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  Discussed treatment options which include titration of gabapentin to a more therapeutic dose.  Can also consider trial of Lyrica in future.  2.  Consider lidocaine infusion for right lower extremity phantom limb pain.  QTc checked from EKG in November 2022 and is appropriate.  Will need updated EKG if we are considering lidocaine infusion.  3.  Consider spinal cord stimulation for phantom limb pain.  Informed patient that there is mixed evidence on this but this could be a treatment option.  4.  Right rotator cuff arthropathy and dysfunction: Consider right suprascapular nerve block and possible RFA or suprascapular peripheral nerve stimulation.       Recent Visits Date Type Provider Dept  04/26/22 Office Visit Gillis Santa, MD [facility]  03/22/22 Procedure visit Gillis Santa, MD [facility]  Showing recent visits within past 90 days and meeting all other requirements Today's Visits Date Type Provider Dept  06/08/22 Office Visit Gillis Santa, MD [facility]  Showing today's visits and meeting all other requirements Future Appointments Date Type Provider Dept  06/26/22 Appointment Gillis Santa, MD [facility]  Showing future appointments within next 90 days and meeting all other requirements  I discussed the assessment and treatment plan with the patient. The patient was provided an opportunity to ask questions and all were answered. The patient agreed  with the plan and demonstrated an understanding of the instructions.  Patient advised to call back or seek an in-person evaluation if the symptoms or condition worsens.  Duration of encounter: 14mnutes.  Total time on encounter, as per AMA guidelines included both the face-to-face and non-face-to-face time personally spent by the physician and/or other qualified health care professional(s) on the day of the encounter (includes time in activities that require the physician or other qualified health care professional and does not include time in activities normally performed by clinical staff). Physician's time may include the following activities when performed: preparing to see the patient (eg, review of tests, pre-charting review of records) obtaining and/or reviewing separately obtained history performing a medically appropriate examination and/or evaluation counseling and educating the patient/family/caregiver ordering medications, tests, or procedures referring and communicating with other health care professionals (when not separately reported) documenting clinical information in the electronic or other health record independently interpreting results (not separately reported) and communicating results to the patient/ family/caregiver care coordination (not separately reported)  Note by: BGillis Santa MD Date: 06/08/2022; Time: 10:04 AM

## 2022-06-08 NOTE — Progress Notes (Signed)
Sara [name]  [phone] [phone]  ID: Sara [name] OB: [birth year]  MR#: [id number]  BWG#:[id number]  Patient Care Team: Danelle Berry, NP as PCP - General (Nurse Practitioner)  REFERRING PROVIDER: Dr. Mack Guise  REASON FOR REFERRAL: abnormal spine MRI  HPI: Sara [name] is a 71 y.o. female with pmh of PAD s/p right AKA, CAD, HTN, HLD, chronic back pain referred to Oncology for further work up of abnormal spine MRI.   Patient reports history of chronic pain in the back specifically on the right lower side when she is doing any activity.  She had Xray of T spine and lumbar spine on 02/23/2022 which showed degenerative changes and chronic compression fracture of T11, L1 and L3. MRI T spine from Aug 2023 showed a chronic appearing compression fracture of the T11 vertebral body is present with 30%-35% loss of anterior vertebral body height. There is mild grade 1 anterolisthesis of T10 on T11 adjacent to the fracture. There are multiple small round scattered lesions within the thoracic spine with the largest measuring up to only approximately 1m in greatest diameter concerning for multiple small osseous metastases (or possibly multiple myeloma).   PET CT scan from 05/16/2022 showed scattered small foci osseous hypermetabolic activity in right medial clavicle, left medial clavicle, sternum and thoracolumbar spine with SUV between 2-3.  These lesions are fairly occult on the CT scan.  Also showed small foci of hilar and infrahilar activity which is mild may be reactive.  SPEP/UPEP/IFE showed IgA lambda monoclonal protein.  M spike was not quantified due to presence of other migrating proteins.  Kappa and lambda light chains and ratio normal.  Hemoglobin and creatinine normal.  Calcium mildly low at 8.7.  INTERVAL HISTORY:  Patient seen today to discuss bone marrow biopsy results.  She is following with Dr. LHolley Raringfor her back pain and is  scheduled for nerve block on November 6.  Patient denies fever, chills, night sweats, new lumps or bumps, nausea, vomiting, shortness of breath, cough, abdominal pain, bleeding, bowel or bladder issues. Energy level is good.  Appetite is good.  Denies any weight loss.   REVIEW OF SYSTEMS:   Review of Systems  Respiratory:  Negative for cough, shortness of breath and wheezing.   Cardiovascular:  Negative for chest pain and leg swelling.  Gastrointestinal:  Negative for abdominal pain, nausea and vomiting.  Musculoskeletal:  Positive for back pain.    As per HPI. Otherwise, a complete review of systems is negative.  PAST MEDICAL HISTORY: Past Medical History:  Diagnosis Date   Arthritis    right shoulder   Depression    Dyspnea    GERD (gastroesophageal reflux disease)    History of blood clots    Hyperlipidemia    Hypertension    Mild mitral regurgitation    Osteoporosis    Peripheral vascular disease (HCC)    Stomach ulcer    Vascular disease    Sees Dr. SDelana Meyer  Vertigo    Last episode approx Aug 2015   Wears dentures    full upper    PAST SURGICAL HISTORY: Past Surgical History:  Procedure Laterality Date   ADENOIDECTOMY     AMPUTATION Right 08/11/2020   Procedure: AMPUTATION ABOVE KNEE;  Surgeon: SKatha Cabal MD;  Location: [facility];  Service: Vascular;  Laterality: Right;   ARTERY BIOPSY Right 07/14/2019   Procedure: BIOPSY TEMPORAL ARTERY;  Surgeon: SKatha Cabal MD;  Location: [facility];  Service: Vascular;  Laterality: Right;   BREAST CYST ASPIRATION Left    CARDIAC CATHETERIZATION  02/15/2017   [facility]   COLONOSCOPY WITH PROPOFOL N/A 10/22/2017   Procedure: COLONOSCOPY WITH PROPOFOL;  Surgeon: Lucilla Lame, MD;  Location: [place];  Service: Endoscopy;  Laterality: N/A;  specimens not taken--pt on Plavix will be brought back in after 7 days off med   COLONOSCOPY WITH PROPOFOL N/A 11/05/2017   Procedure: COLONOSCOPY WITH PROPOFOL;  Surgeon:  Lucilla Lame, MD;  Location: [place];  Service: Endoscopy;  Laterality: N/A;   ESOPHAGOGASTRODUODENOSCOPY N/A 12/21/2014   Procedure: ESOPHAGOGASTRODUODENOSCOPY (EGD);  Surgeon: Lucilla Lame, MD;  Location: [place];  Service: Gastroenterology;  Laterality: N/A;   ESOPHAGOGASTRODUODENOSCOPY (EGD) WITH PROPOFOL N/A 02/08/2016   Procedure: ESOPHAGOGASTRODUODENOSCOPY (EGD) WITH PROPOFOL;  Surgeon: Lucilla Lame, MD;  Location: [facility];  Service: Endoscopy;  Laterality: N/A;   FASCIOTOMY Right 05/30/2019   Procedure: FASCIOTOMY;  Surgeon: [name], MD;  Location: [facility];  Service: Vascular;  Laterality: Right;   FASCIOTOMY CLOSURE Right 06/04/2019   Procedure: FASCIOTOMY CLOSURE;  Surgeon: [name], MD;  Location: [facility];  Service: Vascular;  Laterality: Right;   HEMORROIDECTOMY  2014   LOWER EXTREMITY ANGIOGRAPHY Right 05/30/2019   Procedure: LOWER EXTREMITY ANGIOGRAPHY;  Surgeon: [name], MD;  Location: [place] [facility];  Service: Cardiovascular;  Laterality: Right;   LOWER EXTREMITY ANGIOGRAPHY Right 07/02/2020   Procedure: LOWER EXTREMITY ANGIOGRAPHY;  Surgeon: [name], MD;  Location: [place] [facility];  Service: Cardiovascular;  Laterality: Right;   PERIPHERAL VASCULAR CATHETERIZATION N/A 02/09/2016   Procedure: Abdominal Aortogram w/Lower Extremity;  Surgeon: [name], MD;  Location: [place] [facility];  Service: Cardiovascular;  Laterality: N/A;   PERIPHERAL VASCULAR CATHETERIZATION Right 02/10/2016   Procedure: Lower Extremity Angiography;  Surgeon: [name], MD;  Location: [place] [facility];  Service: Cardiovascular;  Laterality: Right;   POLYPECTOMY  11/05/2017   Procedure: POLYPECTOMY;  Surgeon: Lucilla Lame, MD;  Location: [place];  Service: Endoscopy;;   SHOULDER ARTHROSCOPY WITH ROTATOR CUFF REPAIR AND SUBACROMIAL DECOMPRESSION Right 02/27/2020   Procedure: RIGHT SHOULDER  ARTHROSCOPY SUBACROMIAL DECOMPRESSION, DISTAL CLAVICLE EXCISION AND MINI-OPEN ROTATOR CUFF REPAIR;  Surgeon: Thornton Park, MD;  Location: [facility];  Service: Orthopedics;  Laterality: Right;   TONSILLECTOMY     VASCULAR SURGERY  [id number]   Fem-Pop Bypass    FAMILY HISTORY: Family History  Problem Relation Age of Onset   CVA Mother    Heart attack Mother    Cancer Father        colon cancer   Colon cancer Father    Heart disease Maternal Uncle    Breast cancer Neg Hx     HEALTH MAINTENANCE: Social History   Tobacco Use   Smoking status: Former    Packs/day: 2.00    Years: 25.00    Total pack years: 50.00    Types: Cigarettes    Quit date: 08/22/1991    Years since quitting: 30.8   Smokeless tobacco: Never  Vaping Use   Vaping Use: Never used  Substance Use Topics   Alcohol use: No    Alcohol/week: 0.0 standard drinks of alcohol   Drug use: Never     Allergies  Allergen Reactions   Hydrocodone-Acetaminophen     Other reaction(s): Flushing Other reaction(s): Flushing   Amoxicillin Other (See Comments)    Yeast infection   Metoprolol Tartrate Rash  Oxycodone Itching   Vicodin [Hydrocodone-Acetaminophen] Hives and Rash    Flushing    Current Outpatient Medications  Medication Sig Dispense Refill   alendronate (FOSAMAX) 70 MG tablet Take 70 mg by mouth once a week. Take with a full glass of water on an empty stomach.     apixaban (ELIQUIS) 5 MG TABS tablet Take 1 tablet (5 mg total) by mouth 2 (two) times daily. 60 tablet 11   atorvastatin (LIPITOR) 40 MG tablet Take 40 mg by mouth at bedtime.      calcium carbonate (OSCAL) 1500 (600 Ca) MG TABS tablet Take 1,200 mg of elemental calcium by mouth 2 (two) times daily with a meal.     cholecalciferol (VITAMIN D3) 25 MCG (1000 UNIT) tablet Take 1,000 Units by mouth daily.     digoxin (LANOXIN) 0.25 MG tablet Take by mouth.     DULoxetine (CYMBALTA) 60 MG capsule Take 60 mg by mouth 2 (two) times daily.      gabapentin (NEURONTIN) 800 MG tablet Take 1 tablet (800 mg total) by mouth 3 (three) times daily. 90 tablet 3   losartan (COZAAR) 25 MG tablet Take 25 mg by mouth every morning.      oxyCODONE-acetaminophen (PERCOCET) 7.5-325 MG tablet one tablet po q 8-12 hours as needed for chronic pain.     Tocilizumab (ACTEMRA ACTPEN) 162 MG/0.9ML SOAJ Inject 162 mg into the skin once a week.      zolpidem (AMBIEN) 5 MG tablet Take 5 mg by mouth at bedtime as needed for sleep.      digoxin (LANOXIN) 0.125 MG tablet Take 125 mcg by mouth daily. (Patient not taking: Reported on 06/08/2022)     omeprazole (PRILOSEC) 20 MG capsule Take 20 mg by mouth daily. (Patient not taking: Reported on 06/08/2022)     No current facility-administered medications for this visit.    OBJECTIVE: Vitals:   06/08/22 1100  BP: 132/78  Pulse: 97  Resp: 16  Temp: 98.6 F (37 C)     Body mass index is 23.21 kg/m.      General: Well-developed, well-nourished, no acute distress. Eyes: Pink conjunctiva, anicteric sclera. HEENT: Normocephalic, moist mucous membranes, clear oropharnyx. Lungs: Clear to auscultation bilaterally. Heart: Regular rate and rhythm. No rubs, murmurs, or gallops. Abdomen: Soft, nontender, nondistended. No organomegaly noted, normoactive bowel sounds. Musculoskeletal: No edema, cyanosis, or clubbing. Neuro: Alert, answering all questions appropriately. Cranial nerves grossly intact. Skin: No rashes or petechiae noted. Psych: Normal affect. Lymphatics: No cervical, calvicular, axillary or inguinal LAD.   LAB RESULTS:  Lab Results  Component Value Date   NA 136 04/21/2022   K 4.5 04/21/2022   CL 102 04/21/2022   CO2 27 04/21/2022   GLUCOSE 103 (H) 04/21/2022   BUN 9 04/21/2022   CREATININE 0.76 04/21/2022   CALCIUM 8.7 (L) 04/21/2022   PROT 6.6 04/21/2022   ALBUMIN 4.0 04/21/2022   AST 32 04/21/2022   ALT 36 04/21/2022   ALKPHOS 57 04/21/2022   BILITOT 0.4 04/21/2022   GFRNONAA >60  04/21/2022   GFRAA >60 02/18/2020    Lab Results  Component Value Date   WBC 9.4 05/31/2022   NEUTROABS 6.6 05/31/2022   HGB 15.6 (H) 05/31/2022   HCT 48.4 (H) 05/31/2022   MCV 96.4 05/31/2022   PLT 207 05/31/2022    No results found for: "TIBC", "FERRITIN", "IRONPCTSAT"   STUDIES: CT BONE MARROW BIOPSY & ASPIRATION  Result Date: 05/31/2022 CLINICAL DATA:  IgA gammopathy and need  for bone marrow biopsy. EXAM: CT GUIDED BONE MARROW ASPIRATION AND BIOPSY ANESTHESIA/SEDATION: Moderate (conscious) sedation was employed during this procedure. A total of Versed 1.0 mg and Fentanyl 100 mcg was administered intravenously by radiology nursing. Moderate Sedation Time: 15 minutes. The patient's level of consciousness and vital signs were monitored continuously by radiology nursing throughout the procedure under my direct supervision. PROCEDURE: The procedure risks, benefits, and alternatives were explained to the patient. Questions regarding the procedure were encouraged and answered. The patient understands and consents to the procedure. A time out was performed prior to initiating the procedure. The right gluteal region was prepped with chlorhexidine. Sterile gown and sterile gloves were used for the procedure. Local anesthesia was provided with 1% Lidocaine. Under CT guidance, an 11 gauge On Control bone cutting needle was advanced from a posterior approach into the right iliac bone. Needle positioning was confirmed with CT. Initial non heparinized and heparinized aspirate samples were obtained of bone marrow. Core biopsy was performed via the On Control drill needle. COMPLICATIONS: None FINDINGS: Inspection of initial aspirate did reveal visible particles. Intact core biopsy sample was obtained. IMPRESSION: CT guided bone marrow biopsy of right posterior iliac bone with both aspirate and core samples obtained. Electronically Signed   By: Aletta Edouard M.D.   On: 05/31/2022 12:36   NM PET Image  Initial (PI) Skull Base To Thigh  Result Date: 05/18/2022 CLINICAL DATA:  Initial treatment strategy for reported thoracic spine lesions on outside MRI. EXAM: NUCLEAR MEDICINE PET SKULL BASE TO THIGH TECHNIQUE: 6.8 mCi F-18 FDG was injected intravenously. Full-ring PET imaging was performed from the skull base to thigh after the radiotracer. CT data was obtained and used for attenuation correction and anatomic localization. Fasting blood glucose: 125 mg/dl COMPARISON:  CT scan 02/05/2016 FINDINGS: Mediastinal blood pool activity: SUV max 2.0 Liver activity: SUV max NA NECK: No significant abnormal hypermetabolic activity in this region. Incidental CT findings: Left common carotid atherosclerotic calcification. CHEST: Small hilar and infrahilar foci of activity, for example maximum SUV 2.8 in the right hilum 3.0 in the right infrahilar region, potentially representing mildly hypermetabolic small lymph nodes, although no pathologically enlarged lymph nodes are identified on the CT data. Incidental CT findings: Coronary, aortic arch, and branch vessel atherosclerotic vascular disease. Borderline elevated right hemidiaphragm. Centrilobular emphysema. Mild dependent subsegmental atelectasis in both lungs. ABDOMEN/PELVIS: No significant abnormal hypermetabolic activity in this region. Incidental CT findings: Atherosclerosis is present, including aortoiliac atherosclerotic disease. Prominent stool throughout the colon favors constipation. SKELETON: Scattered small foci osseous hypermetabolic activity noted for example in the right medial clavicle (maximum SUV 2.4), left medial clavicle (maximum SUV 3.0), right sternal manubrium (maximum SUV 3.2), and sternal body (maximum SUV 3.2). These lesions are fairly occult on the CT data. Other faint foci of accentuated metabolic activity are observed in the thoracolumbar spine, for example a T4 posterior vertebral body lesion with maximum SUV 2.3, but are relatively indistinct.  Incidental CT findings: Various compression fractures are identified including T11, L1, and L3. IMPRESSION: 1. There are a few scattered small mildly hypermetabolic bony lesions primarily noted in the sternum and distal clavicles, questionably in the thoracolumbar spine. The thoracic spine MRI examination prompting today's PET-CT is not available, but given that findings were concerning for bony malignancy, this must be considered suspicious. I do not see a separate primary source for metastatic disease to the skeleton; serologic workup to exclude myeloma is suggested. 2. Small foci of hilar and infrahilar activity, mildly above background  activity. No visible enlarged lymph nodes. These hilar and infrahilar nodes may be reactive. 3. Other imaging findings of potential clinical significance: Aortic Atherosclerosis (ICD10-I70.0) and Emphysema (ICD10-J43.9). Coronary atherosclerosis. Prominent stool throughout the colon favors constipation. Compression fractures at T11, L1, and L3. Electronically Signed   By: Van Clines M.D.   On: 05/18/2022 09:22    ASSESSMENT AND PLAN:   Sara [name] is a 71 y.o. female with pmh of PAD s/p right AKA, CAD, HTN, HLD, chronic back pain referred to Oncology for further work up of abnormal spine MRI.   # Hypermetabolic osseous lesions  #IgA MGUS - unclear etiology  - Patient reports history of chronic pain in the back.  - MRI T spine from Aug 2023 showed a chronic appearing compression fracture of the T11 vertebral body is present with 30%-35% loss of anterior vertebral body height. There are multiple small round scattered lesions within the thoracic spine with the largest measuring up to only approximately 10m in greatest diameter concerning for multiple small osseous metastases (or possibly multiple myeloma).   - PET CT scan from 05/16/2022 showed scattered small foci osseous hypermetabolic activity in right medial clavicle, left medial clavicle, sternum and  thoracolumbar spine with SUV between 2-3.  These lesions are fairly occult on the CT scan.  Also showed small foci of hilar and infrahilar activity which is mild may be reactive.  - SPEP/UPEP/IFE showed IgA lambda monoclonal protein.  M spike could not be quantified due to presence of other migrating proteins.  Kappa and lambda light chains and ratio normal.  Hemoglobin and creatinine normal.  Calcium mildly low at 8.7.  -Bone marrow biopsy done on 05/31/2022.  Bone marrow aspirate showed 3% plasma cell and less than 5% on clot and core section while subset appears to show aberrant expression of CD56 interpretation is limited by low percentage of plasma cells.  Kappa lambda-ish appear polyclonal.  Findings not diagnostic of plasma cell neoplasm.   Discussed with the patient and her friend.  Bone marrow biopsy is not diagnostic of plasma cell neoplasm. So, she has IgA MGUS which will need to be monitored with lab work.  At this time there is no clear explanation for the bone lesions present on PET scan and MRI. She has history of osteoporosis with compression spine fracture. I added the patient to tumor board for next Thursday to review the imaging and discuss about biopsy versus close monitoring.  I will inform the patient after tumor board.   # Back pain  -She is following with Dr. LHolley Raringfrom pain management.  She is scheduled for nerve block on November 6.  Orders Placed This Encounter  Procedures   Protein electrophoresis, serum   Immunofixation electrophoresis   Kappa/lambda light chains   Protein Electrophoresis, Urine Rflx.   Immunofixation, urine   CBC with Differential   Basic metabolic panel   RTC in 6 months md visit, labs   Patient expressed understanding and was in agreement with this plan. She also understands that She can call clinic at any time with any questions, concerns, or complaints.   I spent a total of 30 minutes reviewing chart data, face-to-face evaluation with the  patient, counseling and coordination of care as detailed above.  KJane Canary MD   06/08/2022 12:38 PM

## 2022-06-08 NOTE — Progress Notes (Signed)
Safety precautions to be maintained throughout the outpatient stay will include: orient to surroundings, keep bed in low position, maintain call bell within reach at all times, provide assistance with transfer out of bed and ambulation.  

## 2022-06-08 NOTE — Patient Instructions (Addendum)
Instructed NPO for 8 hours prior to procedure; stop taking blood thinners 3 days prior to procedure______________________________________________________________________  Preparing for Procedure with Sedation  NOTICE: Due to recent regulatory changes, starting on March 21, 2021, procedures requiring intravenous (IV) sedation will no longer be performed at the [facility].  These types of procedures are required to be performed at [facility] ambulatory surgery facility.  We are very sorry for the inconvenience.  Procedure appointments are limited to planned procedures: No Prescription Refills. No disability issues will be discussed. No medication changes will be discussed.  Instructions: Oral Intake: Do not eat or drink anything for at least 8 hours prior to your procedure. (Exception: Blood Pressure Medication. See below.) Transportation: A driver is required. You may not drive yourself after the procedure. Blood Pressure Medicine: Do not forget to take your blood pressure medicine with a sip of water the morning of the procedure. If your Diastolic (lower reading) is above 100 mmHg, elective cases will be cancelled/rescheduled. Blood thinners: These will need to be stopped for procedures. Notify our staff if you are taking any blood thinners. Depending on which one you take, there will be specific instructions on how and when to stop it. Diabetics on insulin: Notify the staff so that you can be scheduled 1st case in the morning. If your diabetes requires high dose insulin, take only  of your normal insulin dose the morning of the procedure and notify the staff that you have done so. Preventing infections: Shower with an antibacterial soap the morning of your procedure. Build-up your immune system: Take 1000 mg of Vitamin C with every meal (3 times a day) the day prior to your procedure. Antibiotics: Inform the staff if you have a condition or reason that requires you to take antibiotics  before dental procedures. Pregnancy: If you are pregnant, call and cancel the procedure. Sickness: If you have a cold, fever, or any active infections, call and cancel the procedure. Arrival: You must be in the facility at least 30 minutes prior to your scheduled procedure. Children: Do not bring children with you. Dress appropriately: There is always the possibility that your clothing may get soiled. Valuables: Do not bring any jewelry or valuables.  Reasons to call and reschedule or cancel your procedure: (Following these recommendations will minimize the risk of a serious complication.) Surgeries: Avoid having procedures within 2 weeks of any surgery. (Avoid for 2 weeks before or after any surgery). Flu Shots: Avoid having procedures within 2 weeks of a flu shots. (Avoid for 2 weeks before or after immunizations). Barium: Avoid having a procedure within 7-10 days after having had a radiological study involving the use of radiological contrast. (Myelograms, Barium swallow or enema study). Heart attacks: Avoid any elective procedures or surgeries for the initial 6 months after a "Myocardial Infarction" (Heart Attack). Blood thinners: It is imperative that you stop these medications before procedures. Let us know if you if you take any blood thinner.  Infection: Avoid procedures during or within two weeks of an infection (including chest colds or gastrointestinal problems). Symptoms associated with infections include: Localized redness, fever, chills, night sweats or profuse sweating, burning sensation when voiding, cough, congestion, stuffiness, runny nose, sore throat, diarrhea, nausea, vomiting, cold or Flu symptoms, recent or current infections. It is specially important if the infection is over the area that we intend to treat. Heart and lung problems: Symptoms that may suggest an active cardiopulmonary problem include: cough, chest pain, breathing difficulties or shortness of breath,  dizziness,  ankle swelling, uncontrolled high or unusually low blood pressure, and/or palpitations. If you are experiencing any of these symptoms, cancel your procedure and contact your primary care physician for an evaluation.  Remember:  Regular Business hours are:  Monday to Thursday 8:00 AM to 4:00 PM  Provider's Schedule: Milinda Pointer, MD:  Procedure days: Tuesday and Thursday 7:30 AM to 4:00 PM  Gillis Santa, MD:  Procedure days: Monday and Wednesday 7:30 AM to 4:00 PM ______________________________________________________________________  GENERAL RISKS AND COMPLICATIONS  What are the risk, side effects and possible complications? Generally speaking, most procedures are safe.  However, with any procedure there are risks, side effects, and the possibility of complications.  The risks and complications are dependent upon the sites that are lesioned, or the type of nerve block to be performed.  The closer the procedure is to the spine, the more serious the risks are.  Great care is taken when placing the radio frequency needles, block needles or lesioning probes, but sometimes complications can occur. Infection: Any time there is an injection through the skin, there is a risk of infection.  This is why sterile conditions are used for these blocks.  There are four possible types of infection. Localized skin infection. Central Nervous System Infection-This can be in the form of Meningitis, which can be deadly. Epidural Infections-This can be in the form of an epidural abscess, which can cause pressure inside of the spine, causing compression of the spinal cord with subsequent paralysis. This would require an emergency surgery to decompress, and there are no guarantees that the patient would recover from the paralysis. Discitis-This is an infection of the intervertebral discs.  It occurs in about 1% of discography procedures.  It is difficult to treat and it may lead to surgery.        2. Pain: the  needles have to go through skin and soft tissues, will cause soreness.       3. Damage to internal structures:  The nerves to be lesioned may be near blood vessels or    other nerves which can be potentially damaged.       4. Bleeding: Bleeding is more common if the patient is taking blood thinners such as  aspirin, Coumadin, Ticiid, Plavix, etc., or if he/she have some genetic predisposition  such as hemophilia. Bleeding into the spinal canal can cause compression of the spinal  cord with subsequent paralysis.  This would require an emergency surgery to  decompress and there are no guarantees that the patient would recover from the  paralysis.       5. Pneumothorax:  Puncturing of a lung is a possibility, every time a needle is introduced in  the area of the chest or upper back.  Pneumothorax refers to free air around the  collapsed lung(s), inside of the thoracic cavity (chest cavity).  Another two possible  complications related to a similar event would include: Hemothorax and Chylothorax.   These are variations of the Pneumothorax, where instead of air around the collapsed  lung(s), you may have blood or chyle, respectively.       6. Spinal headaches: They may occur with any procedures in the area of the spine.       7. Persistent CSF (Cerebro-Spinal Fluid) leakage: This is a rare problem, but may occur  with prolonged intrathecal or epidural catheters either due to the formation of a fistulous  track or a dural tear.       8.  Nerve damage: By working so close to the spinal cord, there is always a possibility of  nerve damage, which could be as serious as a permanent spinal cord injury with  paralysis.       9. Death:  Although rare, severe deadly allergic reactions known as "Anaphylactic  reaction" can occur to any of the medications used.      10. Worsening of the symptoms:  We can always make thing worse.  What are the chances of something like this happening? Chances of any of this occuring are  extremely low.  By statistics, you have more of a chance of getting killed in a motor vehicle accident: while driving to the hospital than any of the above occurring .  Nevertheless, you should be aware that they are possibilities.  In general, it is similar to taking a shower.  Everybody knows that you can slip, hit your head and get killed.  Does that mean that you should not shower again?  Nevertheless always keep in mind that statistics do not mean anything if you happen to be on the wrong side of them.  Even if a procedure has a 1 (one) in a 1,000,000 (million) chance of going wrong, it you happen to be that one..Also, keep in mind that by statistics, you have more of a chance of having something go wrong when taking medications.  Who should not have this procedure? If you are on a blood thinning medication (e.g. Coumadin, Plavix, see list of "Blood Thinners"), or if you have an active infection going on, you should not have the procedure.  If you are taking any blood thinners, please inform your physician.  How should I prepare for this procedure? Do not eat or drink anything at least six hours prior to the procedure. Bring a driver with you .  It cannot be a taxi. Come accompanied by an adult that can drive you back, and that is strong enough to help you if your legs get weak or numb from the local anesthetic. Take all of your medicines the morning of the procedure with just enough water to swallow them. If you have diabetes, make sure that you are scheduled to have your procedure done first thing in the morning, whenever possible. If you have diabetes, take only half of your insulin dose and notify our nurse that you have done so as soon as you arrive at the clinic. If you are diabetic, but only take blood sugar pills (oral hypoglycemic), then do not take them on the morning of your procedure.  You may take them after you have had the procedure. Do not take aspirin or any aspirin-containing  medications, at least eleven (11) days prior to the procedure.  They may prolong bleeding. Wear loose fitting clothing that may be easy to take off and that you would not mind if it got stained with Betadine or blood. Do not wear any jewelry or perfume Remove any nail coloring.  It will interfere with some of our monitoring equipment.  NOTE: Remember that this is not meant to be interpreted as a complete list of all possible complications.  Unforeseen problems may occur.  BLOOD THINNERS The following drugs contain aspirin or other products, which can cause increased bleeding during surgery and should not be taken for 2 weeks prior to and 1 week after surgery.  If you should need take something for relief of minor pain, you may take acetaminophen which is found in Tylenol,m Datril, Anacin-3  and Panadol. It is not blood thinner. The products listed below are.  Do not take any of the products listed below in addition to any listed on your instruction sheet.  A.P.C or A.P.C with Codeine Codeine Phosphate Capsules #3 Ibuprofen Ridaura  ABC compound Congesprin Imuran rimadil  Advil Cope Indocin Robaxisal  Alka-Seltzer Effervescent Pain Reliever and Antacid Coricidin or Coricidin-D  Indomethacin Rufen  Alka-Seltzer plus Cold Medicine Cosprin Ketoprofen S-A-C Tablets  Anacin Analgesic Tablets or Capsules Coumadin Korlgesic Salflex  Anacin Extra Strength Analgesic tablets or capsules CP-2 Tablets Lanoril Salicylate  Anaprox Cuprimine Capsules Levenox Salocol  Anexsia-D Dalteparin Magan Salsalate  Anodynos Darvon compound Magnesium Salicylate Sine-off  Ansaid Dasin Capsules Magsal Sodium Salicylate  Anturane Depen Capsules Marnal Soma  APF Arthritis pain formula Dewitt's Pills Measurin Stanback  Argesic Dia-Gesic Meclofenamic Sulfinpyrazone  Arthritis Bayer Timed Release Aspirin Diclofenac Meclomen Sulindac  Arthritis pain formula Anacin Dicumarol Medipren Supac  Analgesic (Safety coated) Arthralgen  Diffunasal Mefanamic Suprofen  Arthritis Strength Bufferin Dihydrocodeine Mepro Compound Suprol  Arthropan liquid Dopirydamole Methcarbomol with Aspirin Synalgos  ASA tablets/Enseals Disalcid Micrainin Tagament  Ascriptin Doan's Midol Talwin  Ascriptin A/D Dolene Mobidin Tanderil  Ascriptin Extra Strength Dolobid Moblgesic Ticlid  Ascriptin with Codeine Doloprin or Doloprin with Codeine Momentum Tolectin  Asperbuf Duoprin Mono-gesic Trendar  Aspergum Duradyne Motrin or Motrin IB Triminicin  Aspirin plain, buffered or enteric coated Durasal Myochrisine Trigesic  Aspirin Suppositories Easprin Nalfon Trillsate  Aspirin with Codeine Ecotrin Regular or Extra Strength Naprosyn Uracel  Atromid-S Efficin Naproxen Ursinus  Auranofin Capsules Elmiron Neocylate Vanquish  Axotal Emagrin Norgesic Verin  Azathioprine Empirin or Empirin with Codeine Normiflo Vitamin E  Azolid Emprazil Nuprin Voltaren  Bayer Aspirin plain, buffered or children's or timed BC Tablets or powders Encaprin Orgaran Warfarin Sodium  Buff-a-Comp Enoxaparin Orudis Zorpin  Buff-a-Comp with Codeine Equegesic Os-Cal-Gesic   Buffaprin Excedrin plain, buffered or Extra Strength Oxalid   Bufferin Arthritis Strength Feldene Oxphenbutazone   Bufferin plain or Extra Strength Feldene Capsules Oxycodone with Aspirin   Bufferin with Codeine Fenoprofen Fenoprofen Pabalate or Pabalate-SF   Buffets II Flogesic Panagesic   Buffinol plain or Extra Strength Florinal or Florinal with Codeine Panwarfarin   Buf-Tabs Flurbiprofen Penicillamine   Butalbital Compound Four-way cold tablets Penicillin   Butazolidin Fragmin Pepto-Bismol   Carbenicillin Geminisyn Percodan   Carna Arthritis Reliever Geopen Persantine   Carprofen Gold's salt Persistin   Chloramphenicol Goody's Phenylbutazone   Chloromycetin Haltrain Piroxlcam   Clmetidine heparin Plaquenil   Cllnoril Hyco-pap Ponstel   Clofibrate Hydroxy chloroquine Propoxyphen         Before  stopping any of these medications, be sure to consult the physician who ordered them.  Some, such as Coumadin (Warfarin) are ordered to prevent or treat serious conditions such as "deep thrombosis", "pumonary embolisms", and other heart problems.  The amount of time that you may need off of the medication may also vary with the medication and the reason for which you were taking it.  If you are taking any of these medications, please make sure you notify your pain physician before you undergo any procedures.         Facet Joint Block The facet joints connect the bones of the spine (vertebrae). They make it possible for you to bend, twist, and make other movements with your spine. They also keep you from bending too far, twisting too far, and making other extreme movements. A facet joint block is a procedure in which a numbing  medicine (anesthetic) is injected into a facet joint. In many cases, an anti-inflammatory medicine (steroid) is also injected. A facet joint block may be done: To diagnose neck or back pain. If the pain gets better after a facet joint block, it means the pain is probably coming from the facet joint. If the pain does not get better, it means the pain is probably not coming from the facet joint. To relieve neck or back pain that is caused by an inflamed facet joint. A facet joint block is only done to relieve pain if the pain does not improve with other methods, such as medicine, exercise programs, and physical therapy. Tell a health care provider about: Any allergies you have. All medicines you are taking, including vitamins, herbs, eye drops, creams, and over-the-counter medicines. Any problems you or family members have had with anesthetic medicines. Any blood disorders you have. Any surgeries you have had. Any medical conditions you have or have had. Whether you are pregnant or may be pregnant. What are the risks? Generally, this is a safe procedure. However, problems  may occur, including: Bleeding. Injury to a nerve near the injection site. Pain at the injection site. Weakness or numbness in areas controlled by nerves near the injection site. Infection. Temporary fluid retention. Allergic reactions to medicines or dyes. Injury to other structures or organs near the injection site. What happens before the procedure? Medicines Ask your health care provider about: Changing or stopping your regular medicines. This is especially important if you are taking diabetes medicines or blood thinners. Taking medicines such as aspirin and ibuprofen. These medicines can thin your blood. Do not take these medicines unless your health care provider tells you to take them. Taking over-the-counter medicines, vitamins, herbs, and supplements. Eating and drinking Follow instructions from your health care provider about eating and drinking, which may include: 8 hours before the procedure - stop eating heavy meals or foods, such as meat, fried foods, or fatty foods. 6 hours before the procedure - stop eating light meals or foods, such as toast or cereal. 6 hours before the procedure - stop drinking milk or drinks that contain milk. 2 hours before the procedure - stop drinking clear liquids. Staying hydrated Follow instructions from your health care provider about hydration, which may include: Up to 2 hours before the procedure - you may continue to drink clear liquids, such as water, clear fruit juice, black coffee, and plain tea. General instructions Do not use any products that contain nicotine or tobacco for at least 4-6 weeks before the procedure. These products include cigarettes, e-cigarettes, and chewing tobacco. If you need help quitting, ask your health care provider. Plan to have someone take you home from the hospital or clinic. Ask your health care provider: How your surgery site will be marked. What steps will be taken to help prevent infection. These may  include: Removing hair at the surgery site. Washing skin with a germ-killing soap. Receiving antibiotic medicine. What happens during the procedure?  You will put on a hospital gown. You will lie on your stomach on an X-ray table. You may be asked to lie in a different position if an injection will be made in your neck. Machines will be used to monitor your oxygen levels, heart rate, and blood pressure. Your skin will be cleaned. If an injection will be made in your neck, an IV will be inserted into one of your veins. Fluids and medicine will flow directly into your body through the  IV. A numbing medicine (local anesthetic) will be applied to your skin. Your skin may sting or burn for a moment. A video X-ray machine (fluoroscopy) will be used to find the joint. In some cases, a CT scan may be used. A contrast dye may be injected into the facet joint area to help find the joint. When the joint is located, an anesthetic will be injected into the joint through the needle. Your health care provider will ask you whether you feel pain relief. If you feel relief, a steroid may be injected to provide pain relief for a longer period of time. If you do not feel relief or feel only partial relief, additional injections of an anesthetic may be made in other facet joints. The needle will be removed. Your skin will be cleaned. A bandage (dressing) will be applied over each injection site. The procedure may vary among health care providers and hospitals. What happens after the procedure? Your blood pressure, heart rate, breathing rate, and blood oxygen level will be monitored until you leave the hospital or clinic. You will lie down and rest for a period of time. Summary A facet joint block is a procedure in which a numbing medicine (anesthetic) is injected into a facet joint. An anti-inflammatory medicine (steroid) may also be injected. Follow instructions from your health care provider about medicines  and eating and drinking before the procedure. Do not use any products that contain nicotine or tobacco for at least 4-6 weeks before the procedure. You will lie on your stomach for the procedure, but you may be asked to lie in a different position if an injection will be made in your neck. When the joint is located, an anesthetic will be injected into the joint through the needle. This information is not intended to replace advice given to you by your health care provider. Make sure you discuss any questions you have with your health care provider. Document Revised: 12/07/2021 Document Reviewed: 06/06/2021 [employer] Patient Education  [employer].

## 2022-06-08 NOTE — Progress Notes (Signed)
Pt reports was seen in Dr lateef's office this morning and was having severe pain in right shoulder area, which MD referred to as right thoracic spine pain and pt took a oxycodone. Pt states she is to have area injected on 06/26/22.  Pt in today for bone marrow biopsy results.

## 2022-06-09 NOTE — Therapy (Signed)
OUTPATIENT PHYSICAL THERAPY SHOULDER TREATMENT  Patient Name: Sara [name] MRN: [id number] DOB:1951-07-26, 71 y.o., female Today's Date: 06/12/2022   PT End of Session - 06/12/22 1153     Visit Number 21    Number of Visits 33    Date for PT Re-Evaluation 07/18/22    Authorization Type eval: 04/20/50, recert: 76/1/60    PT Start Time 1150    PT Stop Time 1220    PT Time Calculation (min) 30 min    Activity Tolerance Patient tolerated treatment well    Behavior During Therapy [facility] for tasks assessed/performed            Past Medical History:  Diagnosis Date   Arthritis    right shoulder   Depression    Dyspnea    GERD (gastroesophageal reflux disease)    History of blood clots    Hyperlipidemia    Hypertension    Mild mitral regurgitation    Osteoporosis    Peripheral vascular disease ([place])    Stomach ulcer    Vascular disease    Sees Dr. Delana Meyer   Vertigo    Last episode approx Aug 2015   Wears dentures    full upper   Past Surgical History:  Procedure Laterality Date   ADENOIDECTOMY     AMPUTATION Right 08/11/2020   Procedure: AMPUTATION ABOVE KNEE;  Surgeon: [name], MD;  Location: [facility];  Service: Vascular;  Laterality: Right;   ARTERY BIOPSY Right 07/14/2019   Procedure: BIOPSY TEMPORAL ARTERY;  Surgeon: [name], MD;  Location: [facility];  Service: Vascular;  Laterality: Right;   BREAST CYST ASPIRATION Left    CARDIAC CATHETERIZATION  02/15/2017   [facility]   COLONOSCOPY WITH PROPOFOL N/A 10/22/2017   Procedure: COLONOSCOPY WITH PROPOFOL;  Surgeon: Lucilla Lame, MD;  Location: [place];  Service: Endoscopy;  Laterality: N/A;  specimens not taken--pt on Plavix will be brought back in after 7 days off med   COLONOSCOPY WITH PROPOFOL N/A 11/05/2017   Procedure: COLONOSCOPY WITH PROPOFOL;  Surgeon: Lucilla Lame, MD;  Location: [place];  Service: Endoscopy;  Laterality: N/A;   ESOPHAGOGASTRODUODENOSCOPY N/A 12/21/2014    Procedure: ESOPHAGOGASTRODUODENOSCOPY (EGD);  Surgeon: Lucilla Lame, MD;  Location: [place];  Service: Gastroenterology;  Laterality: N/A;   ESOPHAGOGASTRODUODENOSCOPY (EGD) WITH PROPOFOL N/A 02/08/2016   Procedure: ESOPHAGOGASTRODUODENOSCOPY (EGD) WITH PROPOFOL;  Surgeon: Lucilla Lame, MD;  Location: [facility];  Service: Endoscopy;  Laterality: N/A;   FASCIOTOMY Right 05/30/2019   Procedure: FASCIOTOMY;  Surgeon: [name], MD;  Location: [facility];  Service: Vascular;  Laterality: Right;   FASCIOTOMY CLOSURE Right 06/04/2019   Procedure: FASCIOTOMY CLOSURE;  Surgeon: [name], MD;  Location: [facility];  Service: Vascular;  Laterality: Right;   HEMORROIDECTOMY  2014   LOWER EXTREMITY ANGIOGRAPHY Right 05/30/2019   Procedure: LOWER EXTREMITY ANGIOGRAPHY;  Surgeon: [name], MD;  Location: [place] CV [facility];  Service: Cardiovascular;  Laterality: Right;   LOWER EXTREMITY ANGIOGRAPHY Right 07/02/2020   Procedure: LOWER EXTREMITY ANGIOGRAPHY;  Surgeon: [name], MD;  Location: [place] CV LAB;  Service: Cardiovascular;  Laterality: Right;   PERIPHERAL VASCULAR CATHETERIZATION N/A 02/09/2016   Procedure: Abdominal Aortogram w/Lower Extremity;  Surgeon: [name], MD;  Location: [place] CV LAB;  Service: Cardiovascular;  Laterality: N/A;   PERIPHERAL VASCULAR CATHETERIZATION Right 02/10/2016   Procedure: Lower Extremity Angiography;  Surgeon: [name], MD;  Location: [facility] INVASIVE CV  LAB;  Service: Cardiovascular;  Laterality: Right;   POLYPECTOMY  11/05/2017   Procedure: POLYPECTOMY;  Surgeon: Lucilla Lame, MD;  Location: [place];  Service: Endoscopy;;   SHOULDER ARTHROSCOPY WITH ROTATOR CUFF REPAIR AND SUBACROMIAL DECOMPRESSION Right 02/27/2020   Procedure: RIGHT SHOULDER ARTHROSCOPY SUBACROMIAL DECOMPRESSION, DISTAL CLAVICLE EXCISION AND MINI-OPEN ROTATOR CUFF REPAIR;  Surgeon: Thornton Park, MD;  Location:  [facility];  Service: Orthopedics;  Laterality: Right;   TONSILLECTOMY     VASCULAR SURGERY  4627,[id number]   Fem-Pop Bypass   Patient Active Problem List   Diagnosis Date Noted   Thoracic facet joint syndrome 06/08/2022   Bone lesion 06/08/2022   IgA monoclonal gammopathy 05/18/2022   Degeneration of lumbar intervertebral disc 01/04/2022   Shoulder pain 01/04/2022   Pain in joint of right shoulder 01/04/2022   Spasm of back muscles 01/04/2022   Thoracic back pain 01/04/2022   History of left above knee amputation [facility]) (Dec 2021) 12/27/2021   Rotator cuff arthropathy of right shoulder 12/27/2021   Hx of rotator cuff surgery 12/27/2021   Chronic pain syndrome 12/27/2021   Adhesive capsulitis of right shoulder 12/05/2021   Phantom pain ([place]) 11/10/2021   Muscle spasm 09/09/2020   S/P AKA (above knee amputation), right ([place]) 09/08/2020   Ischemia of extremity 07/02/2020   Chronic pain in right shoulder 11/25/2019   Encounter for long-term (current) use of high-risk medication 07/16/2019   GCA (giant cell arteritis) ([place]) 07/16/2019   Temporal arteritis ([place]) 07/13/2019   Postoperative wound infection 06/23/2019   Ischemic leg 05/31/2019   Atherosclerosis of native arteries of extremity with intermittent claudication ([place]) 05/30/2019   Depression 05/29/2019   Eczema of lower extremity 03/04/2018   Chronic venous insufficiency 03/02/2018   Personal history of colonic polyps    Family history of colonic polyps    Benign neoplasm of ascending colon    Mitral valve insufficiency 02/08/2017   Dyspnea on exertion 02/07/2017   Non-rheumatic mitral regurgitation 02/07/2017   Precordial pain 02/07/2017   CAD (coronary artery disease) 12/21/2016   Essential hypertension 12/21/2016   Compression fracture of lumbar spine, non-traumatic ([place]) 04/26/2016   Compression fracture of L3 lumbar vertebra 04/20/2016   PVD (peripheral vascular disease) ([place]) 02/24/2016   Family history of premature CAD  02/24/2016   Gastritis    Other specified diseases of esophagus    Hiatal hernia    Gastritis and gastroduodenitis    Ischemia of lower extremity    Arterial occlusion ([place])    Atherosclerosis of aorta ([place])    History of smoking    Hyperlipidemia    Pain in the chest    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 02/05/2016   Carotid artery stenosis 01/04/2016   Vertigo, benign paroxysmal 12/28/2015   Clinical depression 09/06/2015   History of alcoholism ([place]) 09/06/2015   Angiopathy, 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[place]) 09/06/2015   Atherosclerosis of native arteries of extremity with rest pain ([place]) 09/06/2015   Acute non-recurrent maxillary sinusitis 07/14/2015   Vaginal pruritus 05/26/2015   Chronic recurrent major depressive disorder ([place]) 03/09/2015   Osteoporosis, post-menopausal 03/09/2015   Peripheral blood vessel disorder ([place]) 03/09/2015   Acid reflux 03/09/2015   GERD (gastroesophageal reflux disease) 12/21/2014   Carotid artery narrowing 01/28/2014   Peripheral arterial occlusive disease ([place]) 06/08/2011   Occlusion and stenosis of unspecified carotid artery 06/08/2011   PAD (peripheral artery disease) ([place]) 06/08/2011   PCP: Danelle Berry, NP  REFERRING PROVIDER: Barrie Lyme MD  REFERRING  DIAGNOSIS: S29.012A (ICD-10-CM) - Strain of muscle and tendon of back wall of thorax, initial encounter  THERAPY DIAG: Muscle weakness (generalized)  Other low back pain  Chronic right shoulder pain  RATIONALE FOR EVALUATION AND TREATMENT: Rehabilitation  ONSET DATE: 08/11/20 (approximate)  FROM INITIAL EVALUATION  SUBJECTIVE:                                                                                                                                                                                         Chief Complaint: R thoracic/rib pain  Pertinent History Pt reports she she started experiencing R shoulder pain which started shortly after her RLE  amputation (08/11/20). She attributes the pain to having to self-propel a wheelchair for an extended period of time. She has seen Dr. Mack Guise at [facility] in the past and underwent a R shoulder mini-open rotator cuff repair, DCE, and SAD on 02/27/20. Since the pain started she has undergone R shoulder injections without improvement in her pain. When pt describes her pain she points to the area below her R shoulder blade around T10/T11. She denies weakness in the RUE or any pain in the area of the [facility] joint or shoulder pain. Denies N/T in back or RUE. Pain worsens with extended use of RUE especially when cleaning the counters in her apartment or sweeping the floor. She has a history of L1 and L3 compression fractures in 2017. She currently sees pain management for her R shoulder pain and takes oxycodone to manage the pain. She also struggles with chronic RLE phantom pain s/p amputation and takes gabapentin for this issue. She has a history of multiple vascular surgeries prior to her amputation and also has a history of carpal tunnel syndrome.  Pain:  Pain Intensity: Not rated today Pain location: R back/flank inferior to the shoulder blade Pain Quality: sharp  Radiating: No  Numbness/Tingling: No Focal Weakness: No Aggravating factors: Excessive use of RUE, mopping the floors, wiping countertops, no pain with driving, Relieving factors: heating pad, never tried lidocaine patch, oxycodone, gabapentin doesn't help with R flank/back pain; 24-hour pain behavior: varies depending on activity History of prior shoulder or neck/shoulder injury, pain, surgery, or therapy: Yes Falls: Has patient fallen in last 6 months? No Dominant hand: right Imaging: Yes, however pt denies any imaging of her thoracic or lumbar spine since the pain started  Prior level of function: Independent with community mobility with device, ambulates with RLE prosthesis and LUE single lofstrand crutch Occupational demands:  retired Hobbies: caring for her dogs Red flags (personal history of cancer, chills/fever, night sweats, nausea, vomiting, unrelenting pain): Negative  Precautions: None  Weight Bearing  Restrictions: No  Living Environment Lives with: lives alone Lives in: House/apartment  Patient Goals: Decrease the pain so she can complete her homemaking responsibilities  OBJECTIVE:   Patient Surveys  FOTO: 55, predicted improvement to 32 QuickDASH: Deferred  Cognition Patient is oriented to person, place, and time.  Recent memory is intact.  Remote memory is intact.  Attention span and concentration are intact.  Expressive speech is intact.  Patient's fund of knowledge is within normal limits for educational level.    Gross Musculoskeletal Assessment Tremor: None Bulk: Normal Tone: Normal  Gait Pt ambulates with RLE prosthesis and lofstrand in LUE, decreased self-selected gait speed;  Posture Increased thoracic kyphosis, forward head, and rounded shoulder in sitting and standing;  Cervical Screen AROM: WFL and painless with overpressure in all planes Spurlings A (ipsilateral lateral flexion/axial compression): R: Negative L: Negative Spurlings B (ipsilateral lateral flexion/contralateral rotation/axial compression): R: Negative L: Negative Repeated movement: Not examined Hoffman Sign (cervical cord compression): R: Not examined L: Not examined ULTT Median: R: Not examined L: Not examined ULTT Ulnar: R: Not examined L: Not examined ULTT Radial: R: Not examined L: Not examined  AROM Cervical AROM is painless in all directions and WFL although not full. No reproduction of pain with AROM R shoulder flexion and abduction which is also limited but WFL. No gross deficits noted. Pt denies pain with thoracic flexion, extension, rotation, and lateral flexion.   LE MMT:  MMT (out of 5) Right 06/12/2022 Left 06/12/2022  Cervical (isometric)  Flexion WNL  Extension WNL  Lateral  Flexion WNL WNL  Rotation WNL WNL      Shoulder   Flexion 4+ 4+  Extension    Abduction 4+ 4+  External rotation (seated) 4+ 4+  Internal rotation (seated) 4+ 4+  Horizontal abduction    Horizontal adduction    Lower Trapezius    Rhomboids        Elbow  Flexion 5 5  Extension 5 5  Pronation    Supination        Wrist  Flexion 5 5  Extension 5 5  Radial deviation    Ulnar deviation        MCP  Flexion    Extension    Abduction 5 5  Adduction 5 5  (* = pain; Blank rows = not tested)  Sensation Grossly intact to light touch bilateral UE as determined by testing dermatomes C2-T2. Proprioception and hot/cold testing deferred on this date.  Reflexes Deferred  Palpation Pt is nontender to palpation around entire R shoulder girdle anterior, lateral, and posterior. No pain with palpation to R cervical paraspinals, upper trap, rhomboids, or mid trap. She has pain to palpation along spinous processes from T9-L3 as well as along paraspinals in this region on the right.  Repeated Movements Deferred  Passive Accessory Intervertebral Motion Pt reports reproduction of pain with CPA and bilateral UPA T9-L3. Unable to fully assess mobility secondary to pain however appear grossly hypomobile with notable thoracic kyphosis when prone.  SPECIAL TESTS Rotator Cuff  Drop Arm Test: Negative Painful Arc (Pain from 60 to 120 degrees scaption): Negative Infraspinatus Muscle Test: Negative  Subacromial Impingement Hawkins-Kennedy: Negative Neer (Block scapula, PROM flexion): Negative Painful Arc (Pain from 60 to 120 degrees scaption): Negative Empty Can: Negative External Rotation Resistance: Negative Horizontal Adduction: Not examined Scapular Assist: Not examined  Labral Tear Biceps Load II (120 elevation, full ER, 90 elbow flexion, full supination, resisted elbow flexion): Not examined Crank (160 scaption,  axial load with IR/ER): Not examined Active Compression Test: Not  examined  Bicep Tendon Pathology Speed (shoulder flexion to 90, external rotation, full elbow extension, and forearm supination with resistance: Not examined Yergason's (resisted shoulder ER and supination/biceps tendon pathology): Not examined  Shoulder Instability Sulcus Sign: Not examined Anterior Apprehension: Not examined  Beighton scale: Deferred  Outcome Measures Quick DASH: Deferred FOTO: Pt completed shoulder FOTO but this appears to be more related to back pain. Will update at next therapy session.   TODAY'S TREATMENT   SUBJECTIVE: Pt reports she is having a lot of R shoulder/flank pain upon arrival. She reports it as >10/10. She saw Dr. Holley Raring and he is planning to perform thoracic injections. She states that she spoke with oncologist regarding the bone marrow biopsy and was told she doesn't have cancer. She is considering seeking a second opinion at [facility].   PAIN: ">10/10" R shoulder/flank pain;   Ther-ex Supine SLR with manual resistance 2 x 10 BLE; Supine bolster knee bridges 2 x 10; Supine LLE leg press into shoulder of therapist 2 x 10; Supine PVC chest press with manual resistance 2 x 10; R sidelying L hip abduction with manual resistance 2 x 10; R sidelying R hip adduction with manual resistance 2 x 10; L sidelying R hip abduction with manual resistance 2 x 10; L sidelying L hip adduction with manual resistance 2 x 10; Seated PVC row with manual resistance 2 x 10;   Not performed: Supine R shoulder flexion from neutral to 90 with 2# dumbbell (DB) x 10, 3# DB x 10; Supine R shoulder serratus punch at 90 flexion with 2# DB x 10, 3# DB x 10; Supine R shoulder circles at 90 flexion with 2# DB x 10 each direction, 3# DB x 10 each direction; L sidelying R shoulder ER with 3# DB 2 x 10; L sidleying R shoulder abduction with 3# DB 2 x 10; R sidelying L hip clams with manual resistance from therapist 2 x 10; R sidelying L hip reverse clams 2 x 10; Prone R  shoulder extension with 3# dumbbell (DB) 2 x 10; Prone R low row with 3# DB 2 x 10; Prone R horizontal abduction with 3# DB 2 x 10; Prone R low trap flexion (Y) with 3# DB 2 x 10; Prone R shoulder ER with 3# DB 2 x 10; Prone L knee flexion with manual resistance 2 x 10 Prone alternating hip extension with manual resistance x 10 BLE; Seated dynamic hug with blue tband 2 x 10; Seated pallof press with green tband x 10 toward each side; Seated scapular retractions 2 x 10; Seated overhead shoulder press with 3# dumbbells x 10, x 5, discontinued second set due to increase in thoracic/R flank pain; Seated shoulder flexion with 3# dumbbells 2 x 10; Seated shoulder flexion with 3# dumbbells 2 x 10; Nautilus lat pull down 60# 3 x 10;   PATIENT EDUCATION:  Education details: Pt educated throughout session about proper posture and technique with exercises. Improved exercise technique, movement at target joints, use of target muscles after min to mod verbal, visual, tactile cues. Person educated: Patient Education method: Explanation, verbal cues, tactile cues, and demonstration Education comprehension: verbalized understanding and returned demonstration;   HOME EXERCISE PROGRAM: None currently   ASSESSMENT:  CLINICAL IMPRESSION: Continued with back/hip/core strengthening during session today. She is having notably more R shoulder/flank pain upon arrival today and throughout her session so session was abbreviated to avoid aggravating the pain.  Pt encouraged to transition back to a front wheeled walker until she sees the prosthetist. PT walked with her out to the car for safety. Pt reports losing weight and she doesn't have thicker socks for her residual RLE so encouraged her to schedule a follow-up appointment with prosthetist soon. Frequency will be maintained at 1x/wk. She will benefit from PT services to address deficits in strength and pain in order to return to full function at home with less  pain.    REHAB POTENTIAL: Fair    CLINICAL DECISION MAKING: Unstable/unpredictable  EVALUATION COMPLEXITY: High   GOALS: Goals reviewed with patient? Yes  SHORT TERM GOALS: Target date:  03/14/2022  Pt will be independent with HEP to improve strength and decrease back pain to improve pain-free function at home. Baseline:  Goal status: ONGOING   LONG TERM GOALS: Target date: 04/11/2022  Pt will increase FOTO to at least 57 in order to demonstrate significant improvement in function at home and work related to back pain  Baseline: 02/14/22: To be completed at next visit; 02/22/22: 45; 03/29/22: Deferred; 05/23/22: 70 Goal status: ACHIEVED  2.  Pt will decrease worst back pain by at least 2 points on the NPRS in order to demonstrate clinically significant reduction in back pain. Baseline: 02/14/22: To be completed at next visit; 03/29/22: 10/10; 05/23/22: 8/10; Goal status: ACHIEVED  3.  Pt will report at least 50% improvement in her back symptoms in order to demonstrate clinically significant reduction in disability related to back pain;        Baseline: 03/29/22: 80% improvement since starting with therapy; Goal status: ACHIEVED   PLAN: PT FREQUENCY: 1x/week  PT DURATION: 8 weeks  PLANNED INTERVENTIONS: Therapeutic exercises, Therapeutic activity, Neuromuscular re-education, Balance training, Gait training, Patient/Family education, Joint manipulation, Joint mobilization, Vestibular training, Canalith repositioning, Aquatic Therapy, Dry Needling, Electrical stimulation, Spinal manipulation, Spinal mobilization, Cryotherapy, Moist heat, Traction, Ultrasound, Ionotophoresis 70m/ml Dexamethasone, and Manual therapy  PLAN FOR NEXT SESSION: Add HEP for LE strengthening, continue manual techniques for R sided back pain and include LE strengthening and gait training,    JPhillips GroutPT, DPT, GCS  Physical Therapist- [facility] 06/12/2022, 1:20 PM

## 2022-06-12 ENCOUNTER — Ambulatory Visit: Payer: Medicare Other

## 2022-06-12 ENCOUNTER — Encounter (HOSPITAL_COMMUNITY): Payer: Self-pay

## 2022-06-12 DIAGNOSIS — M6281 Muscle weakness (generalized): Secondary | ICD-10-CM

## 2022-06-12 DIAGNOSIS — M5459 Other low back pain: Secondary | ICD-10-CM

## 2022-06-12 DIAGNOSIS — G8929 Other chronic pain: Secondary | ICD-10-CM

## 2022-06-12 DIAGNOSIS — R262 Difficulty in walking, not elsewhere classified: Secondary | ICD-10-CM | POA: Diagnosis not present

## 2022-06-12 DIAGNOSIS — M25511 Pain in right shoulder: Secondary | ICD-10-CM | POA: Diagnosis not present

## 2022-06-15 ENCOUNTER — Other Ambulatory Visit: Payer: Medicare Other

## 2022-06-15 ENCOUNTER — Telehealth: Payer: Self-pay | Admitting: Internal Medicine

## 2022-06-15 DIAGNOSIS — M899 Disorder of bone, unspecified: Secondary | ICD-10-CM

## 2022-06-15 NOTE — Progress Notes (Signed)
Tumor Board Documentation  Sara [name] was presented by Dr Darrall Dears at our Tumor Board on 06/15/2022, which included representatives from medical oncology, radiology, pathology, research, navigation, radiation oncology, internal medicine, surgical, palliative care.  Sara [name] currently presents as a new patient, for discussion with history of the following treatments: active survellience.  Additionally, we reviewed previous medical and familial history, history of present illness, and recent lab results along with all available histopathologic and imaging studies. The tumor board considered available treatment options and made the following recommendations: Additional observation (MRI L Spine)    The following procedures/referrals were also placed: No orders of the defined types were placed in this encounter.   Clinical Trial Status: not discussed   Staging used: Not Applicable AJCC Staging:       Group: MGUS Bone Lesions   National site-specific guidelines   were discussed with respect to the case.  Tumor board is a meeting of clinicians from various specialty areas who evaluate and discuss patients for whom a multidisciplinary approach is being considered. Final determinations in the plan of care are those of the provider(s). The responsibility for follow up of recommendations given during tumor board is that of the provider.   Today's extended care, comprehensive team conference, Sara [name] was not present for the discussion and was not examined.   Multidisciplinary Tumor Board is a multidisciplinary case peer review process.  Decisions discussed in the Multidisciplinary Tumor Board reflect the opinions of the specialists present at the conference without having examined the patient.  Ultimately, treatment and diagnostic decisions rest with the primary provider(s) and the patient.

## 2022-06-15 NOTE — Telephone Encounter (Signed)
Case was discussed in tumor board.  The clavicular and sternal lesions are mildly active but there is no CT correlate. Lesions are non specific. Biopsy is not recommended because it will be blind and also due to location.   In lumbar spine, there is mild activity. ?which could be from osteoporosis. Recommended MRI L spine which I will order. And if there are any compression fractures IR can do kyphoplasty and have biopsy at the same time. MRI T spine was not available for review however on PET scan there is no activity at all in thoracic spine.   Discussed with the patient and will order MRI L spine. Schedule f/u to see me on 12/16.

## 2022-06-18 NOTE — Therapy (Signed)
OUTPATIENT PHYSICAL THERAPY SHOULDER TREATMENT  Patient Name: Sara [name] MRN: [id number] DOB:Mar 15, 1951, 71 y.o., female Today's Date: 06/19/2022   PT End of Session - 06/19/22 1119     Visit Number 22    Number of Visits 33    Date for PT Re-Evaluation 07/18/22    Authorization Type eval: 04/23/39, recert: 97/3/53    PT Start Time 1140    PT Stop Time 1220    PT Time Calculation (min) 40 min    Activity Tolerance Patient tolerated treatment well    Behavior During Therapy [facility] for tasks assessed/performed            Past Medical History:  Diagnosis Date   Arthritis    right shoulder   Depression    Dyspnea    GERD (gastroesophageal reflux disease)    History of blood clots    Hyperlipidemia    Hypertension    Mild mitral regurgitation    Osteoporosis    Peripheral vascular disease ([place])    Stomach ulcer    Vascular disease    Sees Dr. Delana Meyer   Vertigo    Last episode approx Aug 2015   Wears dentures    full upper   Past Surgical History:  Procedure Laterality Date   ADENOIDECTOMY     AMPUTATION Right 08/11/2020   Procedure: AMPUTATION ABOVE KNEE;  Surgeon: [name], MD;  Location: [facility];  Service: Vascular;  Laterality: Right;   ARTERY BIOPSY Right 07/14/2019   Procedure: BIOPSY TEMPORAL ARTERY;  Surgeon: [name], MD;  Location: [facility];  Service: Vascular;  Laterality: Right;   BREAST CYST ASPIRATION Left    CARDIAC CATHETERIZATION  02/15/2017   [facility]   COLONOSCOPY WITH PROPOFOL N/A 10/22/2017   Procedure: COLONOSCOPY WITH PROPOFOL;  Surgeon: Lucilla Lame, MD;  Location: [place];  Service: Endoscopy;  Laterality: N/A;  specimens not taken--pt on Plavix will be brought back in after 7 days off med   COLONOSCOPY WITH PROPOFOL N/A 11/05/2017   Procedure: COLONOSCOPY WITH PROPOFOL;  Surgeon: Lucilla Lame, MD;  Location: [place];  Service: Endoscopy;  Laterality: N/A;   ESOPHAGOGASTRODUODENOSCOPY N/A 12/21/2014    Procedure: ESOPHAGOGASTRODUODENOSCOPY (EGD);  Surgeon: Lucilla Lame, MD;  Location: [place];  Service: Gastroenterology;  Laterality: N/A;   ESOPHAGOGASTRODUODENOSCOPY (EGD) WITH PROPOFOL N/A 02/08/2016   Procedure: ESOPHAGOGASTRODUODENOSCOPY (EGD) WITH PROPOFOL;  Surgeon: Lucilla Lame, MD;  Location: [facility];  Service: Endoscopy;  Laterality: N/A;   FASCIOTOMY Right 05/30/2019   Procedure: FASCIOTOMY;  Surgeon: [name], MD;  Location: [facility];  Service: Vascular;  Laterality: Right;   FASCIOTOMY CLOSURE Right 06/04/2019   Procedure: FASCIOTOMY CLOSURE;  Surgeon: [name], MD;  Location: [facility];  Service: Vascular;  Laterality: Right;   HEMORROIDECTOMY  2014   LOWER EXTREMITY ANGIOGRAPHY Right 05/30/2019   Procedure: LOWER EXTREMITY ANGIOGRAPHY;  Surgeon: [name], MD;  Location: [place] [facility];  Service: Cardiovascular;  Laterality: Right;   LOWER EXTREMITY ANGIOGRAPHY Right 07/02/2020   Procedure: LOWER EXTREMITY ANGIOGRAPHY;  Surgeon: [name], MD;  Location: [place] [facility];  Service: Cardiovascular;  Laterality: Right;   PERIPHERAL VASCULAR CATHETERIZATION N/A 02/09/2016   Procedure: Abdominal Aortogram w/Lower Extremity;  Surgeon: [name], MD;  Location: [place] [facility];  Service: Cardiovascular;  Laterality: N/A;   PERIPHERAL VASCULAR CATHETERIZATION Right 02/10/2016   Procedure: Lower Extremity Angiography;  Surgeon: [name], MD;  Location: [facility];  Service: Cardiovascular;  Laterality: Right;   POLYPECTOMY  11/05/2017   Procedure: POLYPECTOMY;  Surgeon: Lucilla Lame, MD;  Location: [place];  Service: Endoscopy;;   SHOULDER ARTHROSCOPY WITH ROTATOR CUFF REPAIR AND SUBACROMIAL DECOMPRESSION Right 02/27/2020   Procedure: RIGHT SHOULDER ARTHROSCOPY SUBACROMIAL DECOMPRESSION, DISTAL CLAVICLE EXCISION AND MINI-OPEN ROTATOR CUFF REPAIR;  Surgeon: Thornton Park, MD;  Location:  [facility];  Service: Orthopedics;  Laterality: Right;   TONSILLECTOMY     VASCULAR SURGERY  4627,[id number]   Fem-Pop Bypass   Patient Active Problem List   Diagnosis Date Noted   Thoracic facet joint syndrome 06/08/2022   Bone lesion 06/08/2022   IgA monoclonal gammopathy 05/18/2022   Degeneration of lumbar intervertebral disc 01/04/2022   Shoulder pain 01/04/2022   Pain in joint of right shoulder 01/04/2022   Spasm of back muscles 01/04/2022   Thoracic back pain 01/04/2022   History of left above knee amputation [facility]) (Dec 2021) 12/27/2021   Rotator cuff arthropathy of right shoulder 12/27/2021   Hx of rotator cuff surgery 12/27/2021   Chronic pain syndrome 12/27/2021   Adhesive capsulitis of right shoulder 12/05/2021   Phantom pain ([place]) 11/10/2021   Muscle spasm 09/09/2020   S/P AKA (above knee amputation), right ([place]) 09/08/2020   Ischemia of extremity 07/02/2020   Chronic pain in right shoulder 11/25/2019   Encounter for long-term (current) use of high-risk medication 07/16/2019   GCA (giant cell arteritis) ([place]) 07/16/2019   Temporal arteritis ([place]) 07/13/2019   Postoperative wound infection 06/23/2019   Ischemic leg 05/31/2019   Atherosclerosis of native arteries of extremity with intermittent claudication ([place]) 05/30/2019   Depression 05/29/2019   Eczema of lower extremity 03/04/2018   Chronic venous insufficiency 03/02/2018   Personal history of colonic polyps    Family history of colonic polyps    Benign neoplasm of ascending colon    Mitral valve insufficiency 02/08/2017   Dyspnea on exertion 02/07/2017   Non-rheumatic mitral regurgitation 02/07/2017   Precordial pain 02/07/2017   CAD (coronary artery disease) 12/21/2016   Essential hypertension 12/21/2016   Compression fracture of lumbar spine, non-traumatic ([place]) 04/26/2016   Compression fracture of L3 lumbar vertebra 04/20/2016   PVD (peripheral vascular disease) ([place]) 02/24/2016   Family history of premature CAD  02/24/2016   Gastritis    Other specified diseases of esophagus    Hiatal hernia    Gastritis and gastroduodenitis    Ischemia of lower extremity    Arterial occlusion ([place])    Atherosclerosis of aorta ([place])    History of smoking    Hyperlipidemia    Pain in the chest    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 02/05/2016   Carotid artery stenosis 01/04/2016   Vertigo, benign paroxysmal 12/28/2015   Clinical depression 09/06/2015   History of alcoholism ([place]) 09/06/2015   Angiopathy, 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[place]) 09/06/2015   Atherosclerosis of native arteries of extremity with rest pain ([place]) 09/06/2015   Acute non-recurrent maxillary sinusitis 07/14/2015   Vaginal pruritus 05/26/2015   Chronic recurrent major depressive disorder ([place]) 03/09/2015   Osteoporosis, post-menopausal 03/09/2015   Peripheral blood vessel disorder ([place]) 03/09/2015   Acid reflux 03/09/2015   GERD (gastroesophageal reflux disease) 12/21/2014   Carotid artery narrowing 01/28/2014   Peripheral arterial occlusive disease ([place]) 06/08/2011   Occlusion and stenosis of unspecified carotid artery 06/08/2011   PAD (peripheral artery disease) ([place]) 06/08/2011   PCP: Danelle Berry, NP  REFERRING PROVIDER: Barrie Lyme MD  REFERRING  DIAGNOSIS: S29.012A (ICD-10-CM) - Strain of muscle and tendon of back wall of thorax, initial encounter  THERAPY DIAG: Muscle weakness (generalized)  Other low back pain  Chronic right shoulder pain  Difficulty in walking, not elsewhere classified  RATIONALE FOR EVALUATION AND TREATMENT: Rehabilitation  ONSET DATE: 08/11/20 (approximate)  FROM INITIAL EVALUATION  SUBJECTIVE:                                                                                                                                                                                         Chief Complaint: R thoracic/rib pain  Pertinent History Pt reports she she started experiencing R  shoulder pain which started shortly after her RLE amputation (08/11/20). She attributes the pain to having to self-propel a wheelchair for an extended period of time. She has seen Dr. Mack Guise at [facility] in the past and underwent a R shoulder mini-open rotator cuff repair, DCE, and SAD on 02/27/20. Since the pain started she has undergone R shoulder injections without improvement in her pain. When pt describes her pain she points to the area below her R shoulder blade around T10/T11. She denies weakness in the RUE or any pain in the area of the [facility] joint or shoulder pain. Denies N/T in back or RUE. Pain worsens with extended use of RUE especially when cleaning the counters in her apartment or sweeping the floor. She has a history of L1 and L3 compression fractures in 2017. She currently sees pain management for her R shoulder pain and takes oxycodone to manage the pain. She also struggles with chronic RLE phantom pain s/p amputation and takes gabapentin for this issue. She has a history of multiple vascular surgeries prior to her amputation and also has a history of carpal tunnel syndrome.  Pain:  Pain Intensity: Not rated today Pain location: R back/flank inferior to the shoulder blade Pain Quality: sharp  Radiating: No  Numbness/Tingling: No Focal Weakness: No Aggravating factors: Excessive use of RUE, mopping the floors, wiping countertops, no pain with driving, Relieving factors: heating pad, never tried lidocaine patch, oxycodone, gabapentin doesn't help with R flank/back pain; 24-hour pain behavior: varies depending on activity History of prior shoulder or neck/shoulder injury, pain, surgery, or therapy: Yes Falls: Has patient fallen in last 6 months? No Dominant hand: right Imaging: Yes, however pt denies any imaging of her thoracic or lumbar spine since the pain started  Prior level of function: Independent with community mobility with device, ambulates with RLE prosthesis and LUE single  lofstrand crutch Occupational demands: retired Hobbies: caring for her dogs Red flags (personal history of cancer, chills/fever, night sweats, nausea, vomiting, unrelenting pain):  Negative  Precautions: None  Weight Bearing Restrictions: No  Living Environment Lives with: lives alone Lives in: House/apartment  Patient Goals: Decrease the pain so she can complete her homemaking responsibilities  OBJECTIVE:   Patient Surveys  FOTO: 14, predicted improvement to 21 QuickDASH: Deferred  Cognition Patient is oriented to person, place, and time.  Recent memory is intact.  Remote memory is intact.  Attention span and concentration are intact.  Expressive speech is intact.  Patient's fund of knowledge is within normal limits for educational level.    Gross Musculoskeletal Assessment Tremor: None Bulk: Normal Tone: Normal  Gait Pt ambulates with RLE prosthesis and lofstrand in LUE, decreased self-selected gait speed;  Posture Increased thoracic kyphosis, forward head, and rounded shoulder in sitting and standing;  Cervical Screen AROM: WFL and painless with overpressure in all planes Spurlings A (ipsilateral lateral flexion/axial compression): R: Negative L: Negative Spurlings B (ipsilateral lateral flexion/contralateral rotation/axial compression): R: Negative L: Negative Repeated movement: Not examined Hoffman Sign (cervical cord compression): R: Not examined L: Not examined ULTT Median: R: Not examined L: Not examined ULTT Ulnar: R: Not examined L: Not examined ULTT Radial: R: Not examined L: Not examined  AROM Cervical AROM is painless in all directions and WFL although not full. No reproduction of pain with AROM R shoulder flexion and abduction which is also limited but WFL. No gross deficits noted. Pt denies pain with thoracic flexion, extension, rotation, and lateral flexion.   LE MMT:  MMT (out of 5) Right 06/19/2022 Left 06/19/2022  Cervical (isometric)   Flexion WNL  Extension WNL  Lateral Flexion WNL WNL  Rotation WNL WNL      Shoulder   Flexion 4+ 4+  Extension    Abduction 4+ 4+  External rotation (seated) 4+ 4+  Internal rotation (seated) 4+ 4+  Horizontal abduction    Horizontal adduction    Lower Trapezius    Rhomboids        Elbow  Flexion 5 5  Extension 5 5  Pronation    Supination        Wrist  Flexion 5 5  Extension 5 5  Radial deviation    Ulnar deviation        MCP  Flexion    Extension    Abduction 5 5  Adduction 5 5  (* = pain; Blank rows = not tested)  Sensation Grossly intact to light touch bilateral UE as determined by testing dermatomes C2-T2. Proprioception and hot/cold testing deferred on this date.  Reflexes Deferred  Palpation Pt is nontender to palpation around entire R shoulder girdle anterior, lateral, and posterior. No pain with palpation to R cervical paraspinals, upper trap, rhomboids, or mid trap. She has pain to palpation along spinous processes from T9-L3 as well as along paraspinals in this region on the right.  Repeated Movements Deferred  Passive Accessory Intervertebral Motion Pt reports reproduction of pain with CPA and bilateral UPA T9-L3. Unable to fully assess mobility secondary to pain however appear grossly hypomobile with notable thoracic kyphosis when prone.  SPECIAL TESTS Rotator Cuff  Drop Arm Test: Negative Painful Arc (Pain from 60 to 120 degrees scaption): Negative Infraspinatus Muscle Test: Negative  Subacromial Impingement Hawkins-Kennedy: Negative Neer (Block scapula, PROM flexion): Negative Painful Arc (Pain from 60 to 120 degrees scaption): Negative Empty Can: Negative External Rotation Resistance: Negative Horizontal Adduction: Not examined Scapular Assist: Not examined  Labral Tear Biceps Load II (120 elevation, full ER, 90 elbow flexion, full supination, resisted  elbow flexion): Not examined Crank (160 scaption, axial load with IR/ER): Not  examined Active Compression Test: Not examined  Bicep Tendon Pathology Speed (shoulder flexion to 90, external rotation, full elbow extension, and forearm supination with resistance: Not examined Yergason's (resisted shoulder ER and supination/biceps tendon pathology): Not examined  Shoulder Instability Sulcus Sign: Not examined Anterior Apprehension: Not examined  Beighton scale: Deferred  Outcome Measures Quick DASH: Deferred FOTO: Pt completed shoulder FOTO but this appears to be more related to back pain. Will update at next therapy session.   TODAY'S TREATMENT   SUBJECTIVE: Pt states that her oncologist ordered a lumbar MRI which is scheduled for 06/22/22. She also has an appointment scheduled with [facility] on 06/28/22 for a second opinion. Her has not been having much R shoulder/flank pain recently but states she hasn't been very active. She denies any resting pain upon arrival. She has an appointment scheduled with Dr. Holley Raring 06/26/22 for thoracic injections.   PAIN: Denies R shoulder/flank pain;   Ther-ex Supine SLR with manual resistance 2 x 15 BLE; Supine bolster knee bridges 2 x 15; Supine RLE manually resisted long axis ER and IR 2 x 15 each; Supine PVC chest press with manual resistance 2 x 15; R sidelying L hip abduction with manual resistance 2 x 15; R sidelying R hip adduction with manual resistance 2 x 15; L sidelying R hip abduction with manual resistance 2 x 15; L sidelying L hip adduction with manual resistance 2 x 15; Seated PVC row with manual resistance x 15; Seated shoulder flexion with 3# dumbbells x 10; Seated shoulder scaption with 3# dumbbells 2 x 10;   Not performed: Supine R shoulder flexion from neutral to 90 with 2# dumbbell (DB) x 10, 3# DB x 10; Supine R shoulder serratus punch at 90 flexion with 2# DB x 10, 3# DB x 10; Supine R shoulder circles at 90 flexion with 2# DB x 10 each direction, 3# DB x 10 each direction; L sidelying R  shoulder ER with 3# DB 2 x 10; L sidleying R shoulder abduction with 3# DB 2 x 10; R sidelying L hip clams with manual resistance from therapist 2 x 10; R sidelying L hip reverse clams 2 x 10; Prone R shoulder extension with 3# dumbbell (DB) 2 x 10; Prone R low row with 3# DB 2 x 10; Prone R horizontal abduction with 3# DB 2 x 10; Prone R low trap flexion (Y) with 3# DB 2 x 10; Prone R shoulder ER with 3# DB 2 x 10; Prone L knee flexion with manual resistance 2 x 10 Prone alternating hip extension with manual resistance x 10 BLE; Seated dynamic hug with blue tband 2 x 10; Seated pallof press with green tband x 10 toward each side; Seated scapular retractions 2 x 10; Seated overhead shoulder press with 3# dumbbells x 10, x 5, discontinued second set due to increase in thoracic/R flank pain; Nautilus lat pull down 60# 3 x 10;   PATIENT EDUCATION:  Education details: Pt educated throughout session about proper posture and technique with exercises. Improved exercise technique, movement at target joints, use of target muscles after min to mod verbal, visual, tactile cues. Person educated: Patient Education method: Explanation, verbal cues, tactile cues, and demonstration Education comprehension: verbalized understanding and returned demonstration;   HOME EXERCISE PROGRAM: None currently   ASSESSMENT:  CLINICAL IMPRESSION: Continued with back/hip/core/shoulder strengthening during session today. She denies any resting pain upon arrival or increase in  pain during session. Pt is more stable with ambulation today. She continues to have some pain in RLE when ambulating with her prosthesis but states she has an appointment upcoming to see prosthetist. Frequency will be maintained at 1x/wk. She will benefit from PT services to address deficits in strength and pain in order to return to full function at home with less pain.    REHAB POTENTIAL: Fair    CLINICAL DECISION MAKING:  Unstable/unpredictable  EVALUATION COMPLEXITY: High   GOALS: Goals reviewed with patient? Yes  SHORT TERM GOALS: Target date:  03/14/2022  Pt will be independent with HEP to improve strength and decrease back pain to improve pain-free function at home. Baseline:  Goal status: ONGOING   LONG TERM GOALS: Target date: 04/11/2022  Pt will increase FOTO to at least 57 in order to demonstrate significant improvement in function at home and work related to back pain  Baseline: 02/14/22: To be completed at next visit; 02/22/22: 45; 03/29/22: Deferred; 05/23/22: 70 Goal status: ACHIEVED  2.  Pt will decrease worst back pain by at least 2 points on the NPRS in order to demonstrate clinically significant reduction in back pain. Baseline: 02/14/22: To be completed at next visit; 03/29/22: 10/10; 05/23/22: 8/10; Goal status: ACHIEVED  3.  Pt will report at least 50% improvement in her back symptoms in order to demonstrate clinically significant reduction in disability related to back pain;        Baseline: 03/29/22: 80% improvement since starting with therapy; Goal status: ACHIEVED   PLAN: PT FREQUENCY: 1x/week  PT DURATION: 8 weeks  PLANNED INTERVENTIONS: Therapeutic exercises, Therapeutic activity, Neuromuscular re-education, Balance training, Gait training, Patient/Family education, Joint manipulation, Joint mobilization, Vestibular training, Canalith repositioning, Aquatic Therapy, Dry Needling, Electrical stimulation, Spinal manipulation, Spinal mobilization, Cryotherapy, Moist heat, Traction, Ultrasound, Ionotophoresis '4mg'$ /ml Dexamethasone, and Manual therapy  PLAN FOR NEXT SESSION: Add HEP for LE strengthening, continue manual techniques for R sided back pain and include LE strengthening and gait training,    Phillips Grout PT, DPT, GCS  Physical Therapist- [facility]  06/19/2022, 1:10 PM

## 2022-06-19 ENCOUNTER — Encounter (INDEPENDENT_AMBULATORY_CARE_PROVIDER_SITE_OTHER): Payer: Self-pay

## 2022-06-19 ENCOUNTER — Ambulatory Visit: Payer: Medicare Other

## 2022-06-19 DIAGNOSIS — M5459 Other low back pain: Secondary | ICD-10-CM

## 2022-06-19 DIAGNOSIS — M25511 Pain in right shoulder: Secondary | ICD-10-CM | POA: Diagnosis not present

## 2022-06-19 DIAGNOSIS — R262 Difficulty in walking, not elsewhere classified: Secondary | ICD-10-CM

## 2022-06-19 DIAGNOSIS — G8929 Other chronic pain: Secondary | ICD-10-CM

## 2022-06-19 DIAGNOSIS — M6281 Muscle weakness (generalized): Secondary | ICD-10-CM | POA: Diagnosis not present

## 2022-06-20 ENCOUNTER — Other Ambulatory Visit: Payer: Self-pay | Admitting: Student in an Organized Health Care Education/Training Program

## 2022-06-20 DIAGNOSIS — D472 Monoclonal gammopathy: Secondary | ICD-10-CM | POA: Diagnosis not present

## 2022-06-20 DIAGNOSIS — I10 Essential (primary) hypertension: Secondary | ICD-10-CM | POA: Diagnosis not present

## 2022-06-20 DIAGNOSIS — E785 Hyperlipidemia, unspecified: Secondary | ICD-10-CM | POA: Diagnosis not present

## 2022-06-20 DIAGNOSIS — Z79899 Other long term (current) drug therapy: Secondary | ICD-10-CM | POA: Diagnosis not present

## 2022-06-20 DIAGNOSIS — I2510 Atherosclerotic heart disease of native coronary artery without angina pectoris: Secondary | ICD-10-CM | POA: Diagnosis not present

## 2022-06-20 DIAGNOSIS — M316 Other giant cell arteritis: Secondary | ICD-10-CM | POA: Diagnosis not present

## 2022-06-20 DIAGNOSIS — R948 Abnormal results of function studies of other organs and systems: Secondary | ICD-10-CM | POA: Diagnosis not present

## 2022-06-21 DIAGNOSIS — Z79899 Other long term (current) drug therapy: Secondary | ICD-10-CM | POA: Diagnosis not present

## 2022-06-21 DIAGNOSIS — Z79891 Long term (current) use of opiate analgesic: Secondary | ICD-10-CM | POA: Diagnosis not present

## 2022-06-21 DIAGNOSIS — M546 Pain in thoracic spine: Secondary | ICD-10-CM | POA: Diagnosis not present

## 2022-06-21 DIAGNOSIS — M545 Low back pain, unspecified: Secondary | ICD-10-CM | POA: Diagnosis not present

## 2022-06-21 DIAGNOSIS — M25511 Pain in right shoulder: Secondary | ICD-10-CM | POA: Diagnosis not present

## 2022-06-21 DIAGNOSIS — M5136 Other intervertebral disc degeneration, lumbar region: Secondary | ICD-10-CM | POA: Diagnosis not present

## 2022-06-21 DIAGNOSIS — G894 Chronic pain syndrome: Secondary | ICD-10-CM | POA: Diagnosis not present

## 2022-06-21 DIAGNOSIS — M25519 Pain in unspecified shoulder: Secondary | ICD-10-CM | POA: Diagnosis not present

## 2022-06-21 DIAGNOSIS — M62830 Muscle spasm of back: Secondary | ICD-10-CM | POA: Diagnosis not present

## 2022-06-22 ENCOUNTER — Ambulatory Visit
Admission: RE | Admit: 2022-06-22 | Discharge: 2022-06-22 | Disposition: A | Discharge status: Home / Self Care | Payer: Medicare Other | Source: Ambulatory Visit | Attending: Internal Medicine | Admitting: Internal Medicine

## 2022-06-22 DIAGNOSIS — M899 Disorder of bone, unspecified: Secondary | ICD-10-CM | POA: Insufficient documentation

## 2022-06-22 DIAGNOSIS — M5126 Other intervertebral disc displacement, lumbar region: Secondary | ICD-10-CM | POA: Diagnosis not present

## 2022-06-22 MED ORDER — GADOBUTROL 1 MMOL/ML IV SOLN
5.0000 mL | Freq: Once | INTRAVENOUS | Status: AC | PRN
  Administered 2022-06-22: 5 mL via INTRAVENOUS

## 2022-06-23 ENCOUNTER — Other Ambulatory Visit: Payer: Self-pay

## 2022-06-23 ENCOUNTER — Telehealth: Payer: Self-pay | Admitting: Student in an Organized Health Care Education/Training Program

## 2022-06-23 NOTE — Telephone Encounter (Signed)
PT stated that the pharmacy send her text stating that doctor Lateef hadn't send Gabapentin prescription in . PT stated that she took her last pill on last night. PT asked that she be giving a call once the prescription has been send in. Thanks

## 2022-06-23 NOTE — Telephone Encounter (Signed)
Refill request sent to Dr Lateef.  

## 2022-06-26 ENCOUNTER — Encounter: Payer: Self-pay | Admitting: Student in an Organized Health Care Education/Training Program

## 2022-06-26 ENCOUNTER — Ambulatory Visit
Payer: Medicare Other | Attending: Student in an Organized Health Care Education/Training Program | Admitting: Student in an Organized Health Care Education/Training Program

## 2022-06-26 VITALS — BP 145/78 | HR 86 | Temp 98.2°F | Resp 16 | Ht 63.0 in | Wt 123.0 lb

## 2022-06-26 DIAGNOSIS — D7589 Other specified diseases of blood and blood-forming organs: Secondary | ICD-10-CM | POA: Diagnosis not present

## 2022-06-26 DIAGNOSIS — M47894 Other spondylosis, thoracic region: Secondary | ICD-10-CM | POA: Diagnosis not present

## 2022-06-26 DIAGNOSIS — G894 Chronic pain syndrome: Secondary | ICD-10-CM | POA: Insufficient documentation

## 2022-06-26 DIAGNOSIS — M12811 Other specific arthropathies, not elsewhere classified, right shoulder: Secondary | ICD-10-CM | POA: Insufficient documentation

## 2022-06-26 MED ORDER — GABAPENTIN 800 MG PO TABS: 800.0000 mg | ORAL_TABLET | Freq: Three times a day (TID) | ORAL | 5 refills | Status: DC

## 2022-06-26 NOTE — Progress Notes (Signed)
PROVIDER NOTE: Information contained herein reflects review and annotations entered in association with encounter. Interpretation of such information and data should be left to medically-trained personnel. Information provided to patient can be located elsewhere in the medical record under "Patient Instructions". Document created using STT-dictation technology, any transcriptional errors that may result from process are unintentional.    Patient: [name]  Service Category: E/M  Provider: Gillis Santa, MD  DOB: 03/09/1951  DOS: 06/26/2022  Referring Provider: Danelle Berry, NP  MRN: [id number]  Specialty: Interventional Pain Management  PCP: Danelle Berry, NP  Type: Established Patient  Setting: Ambulatory outpatient    Location: Office  Delivery: Face-to-face     HPI  Ms. Jamica Woodyard, a 71 y.o. 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 Ms. Corso primary complain today is  Last encounter: My last encounter with her was on 06/23/2022. Pertinent problems: Ms. Morson has Phantom pain [facility]); Rotator cuff arthropathy of right shoulder; Hx of rotator cuff surgery; Chronic pain syndrome; Thoracic back pain; and Thoracic facet joint syndrome on their pertinent problem list. Pain Assessment: Severity of Chronic pain is reported as a 10-Worst pain ever/10. Location: Back Right, Upper/denies. Onset: More than a month ago. Quality: Stabbing. Timing: Several days a week. Modifying factor(s): heating pad. Vitals:  height is _0  (1.6 m) and weight is 123 lb (55.8 kg). Her temporal temperature is 98.2 F (36.8 C). Her blood pressure is 145/78 (abnormal) and her pulse is 86. Her respiration is 16 and oxygen saturation is 94%.   Reason for encounter: Patient presents today for left thoracic facet medial branch nerve blocks.  Unfortunately, she forgot to stop her Eliquis.  This was specifically indicated to her at her last clinic visit and also  put in her after visit summary.  I informed her that for [place] to safely do her injections she will need to be off of her Eliquis for 3 days prior to her procedure.  She is requesting a refill of gabapentin, 800 mg 3 times a day.  She has been stable on this dose for many years.  ROS  Constitutional: Denies any fever or chills Gastrointestinal: No reported hemesis, hematochezia, vomiting, or acute GI distress Musculoskeletal:  Midthoracic back pain related to facet arthropathy Neurological: No reported episodes of acute onset apraxia, aphasia, dysarthria, agnosia, amnesia, paralysis, loss of coordination, or loss of consciousness  Medication Review  DULoxetine, Tocilizumab, alendronate, apixaban, atorvastatin, calcium carbonate, cholecalciferol, digoxin, gabapentin, losartan, omeprazole, oxyCODONE-acetaminophen, and zolpidem  History Review  Allergy: Ms. Tarleton is allergic to hydrocodone-acetaminophen, amoxicillin, metoprolol tartrate, oxycodone, and vicodin [hydrocodone-acetaminophen]. Drug: Ms. Switalski  reports no history of drug use. Alcohol:  reports no history of alcohol use. Tobacco:  reports that she quit smoking about 30 years ago. Her smoking use included cigarettes. She has a 50.00 pack-year smoking history. She has never used smokeless tobacco. Social: Ms. Hollman  reports that she quit smoking about 30 years ago. Her smoking use included cigarettes. She has a 50.00 pack-year smoking history. She has never used smokeless tobacco. She reports that she does not drink alcohol and does not use drugs. Medical:  has a past medical history of Arthritis, Depression, Dyspnea, GERD (gastroesophageal reflux disease), History of blood clots, Hyperlipidemia, Hypertension, Mild mitral regurgitation, Osteoporosis, Peripheral vascular disease ([place]), Stomach ulcer, Vascular disease, Vertigo, and Wears dentures. Surgical: Ms. Guadarrama  has a past surgical history that includes Vascular surgery [id number]);  Hemorroidectomy (2014); Esophagogastroduodenoscopy (  N/A, 12/21/2014); Esophagogastroduodenoscopy (egd) with propofol (N/A, 02/08/2016); Cardiac catheterization (N/A, 02/09/2016); Cardiac catheterization (Right, 02/10/2016); Cardiac catheterization (02/15/2017); Colonoscopy with propofol (N/A, 10/22/2017); Colonoscopy with propofol (N/A, 11/05/2017); polypectomy (11/05/2017); Tonsillectomy; Adenoidectomy; Lower Extremity Angiography (Right, 05/30/2019); Fasciotomy closure (Right, 06/04/2019); Artery Biopsy (Right, 07/14/2019); Fasciotomy (Right, 05/30/2019); Breast cyst aspiration (Left); Shoulder arthroscopy with rotator cuff repair and subacromial decompression (Right, 02/27/2020); Lower Extremity Angiography (Right, 07/02/2020); and Amputation (Right, 08/11/2020). Family: family history includes CVA in her mother; Cancer in her father; Colon cancer in her father; Heart attack in her mother; Heart disease in her maternal uncle.  Laboratory Chemistry Profile   Renal Lab Results  Component Value Date   BUN 9 04/21/2022   CREATININE 0.76 04/21/2022   BCR 13 09/21/2015   GFRAA >60 02/18/2020   GFRNONAA >60 04/21/2022    Hepatic Lab Results  Component Value Date   AST 32 04/21/2022   ALT 36 04/21/2022   ALBUMIN 4.0 04/21/2022   ALKPHOS 57 04/21/2022    Electrolytes Lab Results  Component Value Date   NA 136 04/21/2022   K 4.5 04/21/2022   CL 102 04/21/2022   CALCIUM 8.7 (L) 04/21/2022   MG 1.9 07/03/2020   PHOS 3.6 06/02/2019    Bone Lab Results  Component Value Date   VD25OH 45.8 09/21/2015    Inflammation (CRP: Acute Phase) (ESR: Chronic Phase) Lab Results  Component Value Date   ESRSEDRATE 58 (H) 07/03/2019   LATICACIDVEN 0.8 06/01/2019         Note: Above Lab results reviewed.  Recent Imaging Review  MR Lumbar Spine W Wo Contrast CLINICAL DATA:  Follow-up PET scan and abnormal outside thoracic spine MRI. Suspicion for multiple myeloma.  EXAM: MRI LUMBAR SPINE WITHOUT AND WITH  CONTRAST  TECHNIQUE: Multiplanar and multiecho pulse sequences of the lumbar spine were obtained without and with intravenous contrast.  CONTRAST:  75m GADAVIST GADOBUTROL 1 MMOL/ML IV SOLN  COMPARISON:  PET-CT 05/16/2022 and prior lumbar spine radiographs 02/23/2022  FINDINGS: Segmentation: There are five lumbar type vertebral bodies. The last full intervertebral disc space is labeled L5-S1.  Alignment:  Normal  Vertebrae: There are numerous small lesions scattered throughout the lower thoracic and lumbar vertebral bodies and also in the sacrum. Most of these demonstrate increased STIR signal intensity and low T1 signal intensity and show contrast enhancement. Findings highly suspicious for multiple myeloma.  No acute fractures are identified. Remote compression fractures of L1 and L3.  Conus medullaris and cauda equina: Conus extends to the L1 level. Conus and cauda equina appear normal.  Paraspinal and other soft tissues: No significant paraspinal or retroperitoneal findings. No aortic aneurysm or retroperitoneal adenopathy.  Disc levels:  T12-L1: No significant findings.  L1-2: Mild annular bulge but the spinal canal is generous. No spinal or foraminal stenosis.  L2-3: Bulging degenerated disc, mild retropulsion of the posterosuperior aspect of L3 and facet disease contributing to mild spinal and bilateral lateral recess stenosis. No foraminal stenosis.  L3-4: No focal disc protrusions, spinal or foraminal stenosis. Minimal right lateral recess encroachment.  L4-5: Moderate facet disease and ligamentum flavum thickening but no disc protrusions, spinal or foraminal stenosis.  L5-S1: Mild facet disease. No spinal or foraminal stenosis.  IMPRESSION: 1. Numerous small enhancing lesions scattered throughout the lower thoracic and lumbar vertebral bodies and also in the sacrum highly suspicious for multiple myeloma. No acute fractures are identified. 2. Remote  compression fractures of L1 and L3. 3. Mild spinal and bilateral lateral recess stenosis at  L2-3. 4. Minimal right lateral recess encroachment at L3-4.  Electronically Signed   By: Marijo Sanes M.D.   On: 06/25/2022 12:07 Note: Reviewed        Physical Exam  General appearance: Well nourished, well developed, and well hydrated. In no apparent acute distress Mental status: Alert, oriented x 3 (person, place, & time)       Respiratory: No evidence of acute respiratory distress Eyes: PERLA Vitals: BP (!) 145/78   Pulse 86   Temp 98.2 F (36.8 C) (Temporal)   Resp 16   Ht _0  (1.6 m)   Wt 123 lb (55.8 kg)   SpO2 94%   BMI 21.79 kg/m  BMI: Estimated body mass index is 21.79 kg/m as calculated from the following:   Height as of this encounter: _1  (1.6 m).   Weight as of this encounter: 123 lb (55.8 kg). Ideal: Ideal body weight: 52.4 kg (115 lb 8.3 oz) Adjusted ideal body weight: 53.8 kg (118 lb 8.2 oz)   Thoracic Spine Area Exam  Skin & Axial Inspection: No masses, redness, or swelling Alignment: Symmetrical Functional ROM: Pain restricted ROM Stability: No instability detected Muscle Tone/Strength: Functionally intact. No obvious neuro-muscular anomalies detected. Sensory (Neurological): Musculoskeletal pain pattern, facet mediated Muscle strength & Tone: Complains of area being tender to palpation    Assessment   Diagnosis Status  1. Thoracic facet joint syndrome   2. Rotator cuff arthropathy of right shoulder   3. Chronic pain syndrome    Persistent Persistent Controlled     Plan of Care  Problem-specific:  No problem-specific Assessment & Plan notes found for this encounter.  Ms. Isola Mehlman has a current medication list which includes the following long-term medication(s): apixaban, atorvastatin, calcium carbonate, digoxin, duloxetine, gabapentin, losartan, actemra actpen, and digoxin.  Pharmacotherapy (Medications Ordered): Meds ordered  this encounter  Medications   gabapentin (NEURONTIN) 800 MG tablet    Sig: Take 1 tablet (800 mg total) by mouth 3 (three) times daily.    Dispense:  90 tablet    Refill:  5   Follow-up next week for thoracic medial branch nerve block.  Patient reminded to stop Eliquis 3 days prior.  Follow-up plan:   No follow-ups on file.     1.  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  Discussed treatment options which include titration of gabapentin to a more therapeutic dose.  Can also consider trial of Lyrica in future.  2.  Consider lidocaine infusion for right lower extremity phantom limb pain.  QTc checked from EKG in November 2022 and is appropriate.  Will need updated EKG if we are considering lidocaine infusion.  3.  Consider spinal cord stimulation for phantom limb pain.  Informed patient that there is mixed evidence on this but this could be a treatment option.  4.  Right rotator cuff arthropathy and dysfunction: Consider right suprascapular nerve block and possible RFA or suprascapular peripheral nerve stimulation.    Recent Visits Date Type Provider Dept  06/08/22 Office Visit Gillis Santa, MD [facility]  04/26/22 Office Visit Gillis Santa, MD [facility]  Showing recent visits within past 90 days and meeting all other requirements Today's Visits Date Type Provider Dept  06/26/22 Procedure visit Gillis Santa, MD [facility]  Showing today's visits and meeting all other requirements Future Appointments No visits were found meeting these conditions. Showing future appointments within next 90 days and meeting all other requirements  I discussed  the assessment and treatment plan with the patient. The patient was provided an opportunity to ask questions and all were answered. The patient agreed with the plan and demonstrated an understanding of the instructions.  Patient advised to call back or seek an in-person evaluation if the  symptoms or condition worsens.  Duration of encounter: 59mnutes.  Total time on encounter, as per AMA guidelines included both the face-to-face and non-face-to-face time personally spent by the physician and/or other qualified health care professional(s) on the day of the encounter (includes time in activities that require the physician or other qualified health care professional and does not include time in activities normally performed by clinical staff). Physician's time may include the following activities when performed: preparing to see the patient (eg, review of tests, pre-charting review of records) obtaining and/or reviewing separately obtained history performing a medically appropriate examination and/or evaluation counseling and educating the patient/family/caregiver ordering medications, tests, or procedures referring and communicating with other health care professionals (when not separately reported) documenting clinical information in the electronic or other health record independently interpreting results (not separately reported) and communicating results to the patient/ family/caregiver care coordination (not separately reported)  Note by: BGillis Santa MD Date: 06/26/2022; Time: 8:45 AM

## 2022-06-26 NOTE — Patient Instructions (Signed)
[facility] [id number]  ______________________________________________________________________  Preparing for your procedure  During your procedure appointment there will be: No Prescription Refills. No disability issues to discussed. No medication changes or discussions.  Instructions: Food intake: Avoid eating anything solid for at least 8 hours prior to your procedure. Clear liquid intake: You may take clear liquids such as water up to 2 hours prior to your procedure. (No carbonated drinks. No soda.) Transportation: Unless otherwise stated by your physician, bring a driver. Morning Medicines: Except for blood thinners, take all of your other morning medications with a sip of water. Make sure to take your heart and blood pressure medicines. If your blood pressure's lower number is above 100, the case will be rescheduled. Blood thinners: If you take a blood thinner, but were not instructed to stop it, call our office [phone] and ask to talk to a nurse. Not stopping a blood thinner prior to certain procedures could lead to serious complications. Diabetics on insulin: Notify the staff so that you can be scheduled 1st case in the morning. If your diabetes requires high dose insulin, take only  of your normal insulin dose the morning of the procedure and notify the staff that you have done so. Preventing infections: Shower with an antibacterial soap the morning of your procedure.  Build-up your immune system: Take 1000 mg of Vitamin C with every meal (3 times a day) the day prior to your procedure. Antibiotics: Inform the nursing staff if you are taking any antibiotics or if you have any conditions that may require antibiotics prior to procedures. (Example: recent joint implants)   Pregnancy: If you are pregnant make sure to notify the nursing staff. Not doing so may result in injury to the fetus, including death.  Sickness: If you have a cold, fever, or any active infections, call and cancel or  reschedule your procedure. Receiving steroids while having an infection may result in complications. Arrival: You must be in the facility at least 30 minutes prior to your scheduled procedure. Tardiness: Your scheduled time is also the cutoff time. If you do not arrive at least 15 minutes prior to your procedure, you will be rescheduled.  Children: Do not bring any children with you. Make arrangements to keep them home. Dress appropriately: There is always a possibility that your clothing may get soiled. Avoid long dresses. Valuables: Do not bring any jewelry or valuables.  Reasons to call and reschedule or cancel your procedure: (Following these recommendations will minimize the risk of a serious complication.) Surgeries: Avoid having procedures within 2 weeks of any surgery. (Avoid for 2 weeks before or after any surgery). Flu Shots: Avoid having procedures within 2 weeks of a flu shots or . (Avoid for 2 weeks before or after immunizations). Barium: Avoid having a procedure within 7-10 days after having had a radiological study involving the use of radiological contrast. (Myelograms, Barium swallow or enema study). Heart attacks: Avoid any elective procedures or surgeries for the initial 6 months after a "Myocardial Infarction" (Heart Attack). Blood thinners: It is imperative that you stop these medications before procedures. Let us know if you if you take any blood thinner.  Infection: Avoid procedures during or within two weeks of an infection (including chest colds or gastrointestinal problems). Symptoms associated with infections include: Localized redness, fever, chills, night sweats or profuse sweating, burning sensation when voiding, cough, congestion, stuffiness, runny nose, sore throat, diarrhea, nausea, vomiting, cold or Flu symptoms, recent or current infections. It is specially important if the  infection is over the area that we intend to treat. Heart and lung problems: Symptoms that may  suggest an active cardiopulmonary problem include: cough, chest pain, breathing difficulties or shortness of breath, dizziness, ankle swelling, uncontrolled high or unusually low blood pressure, and/or palpitations. If you are experiencing any of these symptoms, cancel your procedure and contact your primary care physician for an evaluation.  Remember:  Regular Business hours are:  Monday to Thursday 8:00 AM to 4:00 PM  Provider's Schedule: Milinda Pointer, MD:  Procedure days: Tuesday and Thursday 7:30 AM to 4:00 PM  Gillis Santa, MD:  Procedure days: Monday and Wednesday 7:30 AM to 4:00 PM  Stop Eliquis 3 days before procedure.  ______________________________________________________________________

## 2022-06-28 DIAGNOSIS — R0609 Other forms of dyspnea: Secondary | ICD-10-CM | POA: Diagnosis not present

## 2022-06-28 DIAGNOSIS — Z8781 Personal history of (healed) traumatic fracture: Secondary | ICD-10-CM | POA: Diagnosis not present

## 2022-06-28 DIAGNOSIS — M899 Disorder of bone, unspecified: Secondary | ICD-10-CM | POA: Diagnosis not present

## 2022-06-28 DIAGNOSIS — D472 Monoclonal gammopathy: Secondary | ICD-10-CM | POA: Diagnosis not present

## 2022-07-03 ENCOUNTER — Ambulatory Visit: Payer: Medicare Other | Attending: Orthopedic Surgery

## 2022-07-03 DIAGNOSIS — M6281 Muscle weakness (generalized): Secondary | ICD-10-CM | POA: Diagnosis not present

## 2022-07-03 DIAGNOSIS — I739 Peripheral vascular disease, unspecified: Secondary | ICD-10-CM | POA: Diagnosis not present

## 2022-07-03 DIAGNOSIS — G8929 Other chronic pain: Secondary | ICD-10-CM | POA: Insufficient documentation

## 2022-07-03 DIAGNOSIS — I2510 Atherosclerotic heart disease of native coronary artery without angina pectoris: Secondary | ICD-10-CM | POA: Diagnosis not present

## 2022-07-03 DIAGNOSIS — M5459 Other low back pain: Secondary | ICD-10-CM | POA: Insufficient documentation

## 2022-07-03 DIAGNOSIS — I340 Nonrheumatic mitral (valve) insufficiency: Secondary | ICD-10-CM | POA: Diagnosis not present

## 2022-07-03 DIAGNOSIS — I10 Essential (primary) hypertension: Secondary | ICD-10-CM | POA: Diagnosis not present

## 2022-07-03 DIAGNOSIS — E785 Hyperlipidemia, unspecified: Secondary | ICD-10-CM | POA: Diagnosis not present

## 2022-07-03 DIAGNOSIS — M25511 Pain in right shoulder: Secondary | ICD-10-CM | POA: Insufficient documentation

## 2022-07-03 DIAGNOSIS — I351 Nonrheumatic aortic (valve) insufficiency: Secondary | ICD-10-CM | POA: Diagnosis not present

## 2022-07-03 NOTE — Therapy (Signed)
OUTPATIENT PHYSICAL THERAPY SHOULDER TREATMENT  Patient Name: Sara [name] MRN: [id number] DOB:May 01, 1951, 71 y.o., female Today's Date: 07/03/2022   PT End of Session - 07/03/22 1111     Visit Number 23    Number of Visits 33    Date for PT Re-Evaluation 07/18/22    Authorization Type eval: 1/82/99, recert: 37/1/69    PT Start Time 1105    PT Stop Time 1145    PT Time Calculation (min) 40 min    Activity Tolerance Patient tolerated treatment well    Behavior During Therapy [facility] for tasks assessed/performed            Past Medical History:  Diagnosis Date   Arthritis    right shoulder   Depression    Dyspnea    GERD (gastroesophageal reflux disease)    History of blood clots    Hyperlipidemia    Hypertension    Mild mitral regurgitation    Osteoporosis    Peripheral vascular disease ([place])    Stomach ulcer    Vascular disease    Sees Dr. Delana Meyer   Vertigo    Last episode approx Aug 2015   Wears dentures    full upper   Past Surgical History:  Procedure Laterality Date   ADENOIDECTOMY     AMPUTATION Right 08/11/2020   Procedure: AMPUTATION ABOVE KNEE;  Surgeon: [name], MD;  Location: [facility];  Service: Vascular;  Laterality: Right;   ARTERY BIOPSY Right 07/14/2019   Procedure: BIOPSY TEMPORAL ARTERY;  Surgeon: [name], MD;  Location: [facility];  Service: Vascular;  Laterality: Right;   BREAST CYST ASPIRATION Left    CARDIAC CATHETERIZATION  02/15/2017   [facility]   COLONOSCOPY WITH PROPOFOL N/A 10/22/2017   Procedure: COLONOSCOPY WITH PROPOFOL;  Surgeon: Lucilla Lame, MD;  Location: [place];  Service: Endoscopy;  Laterality: N/A;  specimens not taken--pt on Plavix will be brought back in after 7 days off med   COLONOSCOPY WITH PROPOFOL N/A 11/05/2017   Procedure: COLONOSCOPY WITH PROPOFOL;  Surgeon: Lucilla Lame, MD;  Location: [place];  Service: Endoscopy;  Laterality: N/A;   ESOPHAGOGASTRODUODENOSCOPY N/A 12/21/2014    Procedure: ESOPHAGOGASTRODUODENOSCOPY (EGD);  Surgeon: Lucilla Lame, MD;  Location: [place];  Service: Gastroenterology;  Laterality: N/A;   ESOPHAGOGASTRODUODENOSCOPY (EGD) WITH PROPOFOL N/A 02/08/2016   Procedure: ESOPHAGOGASTRODUODENOSCOPY (EGD) WITH PROPOFOL;  Surgeon: Lucilla Lame, MD;  Location: [facility];  Service: Endoscopy;  Laterality: N/A;   FASCIOTOMY Right 05/30/2019   Procedure: FASCIOTOMY;  Surgeon: [name], MD;  Location: [facility];  Service: Vascular;  Laterality: Right;   FASCIOTOMY CLOSURE Right 06/04/2019   Procedure: FASCIOTOMY CLOSURE;  Surgeon: [name], MD;  Location: [facility];  Service: Vascular;  Laterality: Right;   HEMORROIDECTOMY  2014   LOWER EXTREMITY ANGIOGRAPHY Right 05/30/2019   Procedure: LOWER EXTREMITY ANGIOGRAPHY;  Surgeon: [name], MD;  Location: [place] [facility];  Service: Cardiovascular;  Laterality: Right;   LOWER EXTREMITY ANGIOGRAPHY Right 07/02/2020   Procedure: LOWER EXTREMITY ANGIOGRAPHY;  Surgeon: [name], MD;  Location: [place] [facility];  Service: Cardiovascular;  Laterality: Right;   PERIPHERAL VASCULAR CATHETERIZATION N/A 02/09/2016   Procedure: Abdominal Aortogram w/Lower Extremity;  Surgeon: [name], MD;  Location: [place] [facility];  Service: Cardiovascular;  Laterality: N/A;   PERIPHERAL VASCULAR CATHETERIZATION Right 02/10/2016   Procedure: Lower Extremity Angiography;  Surgeon: [name], MD;  Location: [facility] IN[facility]  [facility];  Service: Cardiovascular;  Laterality: Right;   POLYPECTOMY  11/05/2017   Procedure: POLYPECTOMY;  Surgeon: Lucilla Lame, MD;  Location: [place];  Service: Endoscopy;;   SHOULDER ARTHROSCOPY WITH ROTATOR CUFF REPAIR AND SUBACROMIAL DECOMPRESSION Right 02/27/2020   Procedure: RIGHT SHOULDER ARTHROSCOPY SUBACROMIAL DECOMPRESSION, DISTAL CLAVICLE EXCISION AND MINI-OPEN ROTATOR CUFF REPAIR;  Surgeon: Thornton Park, MD;  Location:  [facility];  Service: Orthopedics;  Laterality: Right;   TONSILLECTOMY     VASCULAR SURGERY  4627,[id number]   Fem-Pop Bypass   Patient Active Problem List   Diagnosis Date Noted   Thoracic facet joint syndrome 06/08/2022   Bone lesion 06/08/2022   IgA monoclonal gammopathy 05/18/2022   Degeneration of lumbar intervertebral disc 01/04/2022   Shoulder pain 01/04/2022   Pain in joint of right shoulder 01/04/2022   Spasm of back muscles 01/04/2022   Thoracic back pain 01/04/2022   History of left above knee amputation [facility]) (Dec 2021) 12/27/2021   Rotator cuff arthropathy of right shoulder 12/27/2021   Hx of rotator cuff surgery 12/27/2021   Chronic pain syndrome 12/27/2021   Adhesive capsulitis of right shoulder 12/05/2021   Phantom pain ([place]) 11/10/2021   Muscle spasm 09/09/2020   S/P AKA (above knee amputation), right ([place]) 09/08/2020   Ischemia of extremity 07/02/2020   Chronic pain in right shoulder 11/25/2019   Encounter for long-term (current) use of high-risk medication 07/16/2019   GCA (giant cell arteritis) ([place]) 07/16/2019   Temporal arteritis ([place]) 07/13/2019   Postoperative wound infection 06/23/2019   Ischemic leg 05/31/2019   Atherosclerosis of native arteries of extremity with intermittent claudication ([place]) 05/30/2019   Depression 05/29/2019   Eczema of lower extremity 03/04/2018   Chronic venous insufficiency 03/02/2018   Personal history of colonic polyps    Family history of colonic polyps    Benign neoplasm of ascending colon    Mitral valve insufficiency 02/08/2017   Dyspnea on exertion 02/07/2017   Non-rheumatic mitral regurgitation 02/07/2017   Precordial pain 02/07/2017   CAD (coronary artery disease) 12/21/2016   Essential hypertension 12/21/2016   Compression fracture of lumbar spine, non-traumatic ([place]) 04/26/2016   Compression fracture of L3 lumbar vertebra 04/20/2016   PVD (peripheral vascular disease) ([place]) 02/24/2016   Family history of premature CAD  02/24/2016   Gastritis    Other specified diseases of esophagus    Hiatal hernia    Gastritis and gastroduodenitis    Ischemia of lower extremity    Arterial occlusion ([place])    Atherosclerosis of aorta ([place])    History of smoking    Hyperlipidemia    Pain in the chest    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 02/05/2016   Carotid artery stenosis 01/04/2016   Vertigo, benign paroxysmal 12/28/2015   Clinical depression 09/06/2015   History of alcoholism ([place]) 09/06/2015   Angiopathy, 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[place]) 09/06/2015   Atherosclerosis of native arteries of extremity with rest pain ([place]) 09/06/2015   Acute non-recurrent maxillary sinusitis 07/14/2015   Vaginal pruritus 05/26/2015   Chronic recurrent major depressive disorder ([place]) 03/09/2015   Osteoporosis, post-menopausal 03/09/2015   Peripheral blood vessel disorder ([place]) 03/09/2015   Acid reflux 03/09/2015   GERD (gastroesophageal reflux disease) 12/21/2014   Carotid artery narrowing 01/28/2014   Peripheral arterial occlusive disease ([place]) 06/08/2011   Occlusion and stenosis of unspecified carotid artery 06/08/2011   PAD (peripheral artery disease) ([place]) 06/08/2011   PCP: Danelle Berry, NP  REFERRING PROVIDER: Barrie Lyme MD  REFERRING  DIAGNOSIS: S29.012A (ICD-10-CM) - Strain of muscle and tendon of back wall of thorax, initial encounter  THERAPY DIAG: Muscle weakness (generalized)  Other low back pain  Chronic right shoulder pain  RATIONALE FOR EVALUATION AND TREATMENT: Rehabilitation  ONSET DATE: 08/11/20 (approximate)  FROM INITIAL EVALUATION  SUBJECTIVE:                                                                                                                                                                                         Chief Complaint: R thoracic/rib pain  Pertinent History Pt reports she she started experiencing R shoulder pain which started shortly after her RLE  amputation (08/11/20). She attributes the pain to having to self-propel a wheelchair for an extended period of time. She has seen Dr. Mack Guise at [facility] in the past and underwent a R shoulder mini-open rotator cuff repair, DCE, and SAD on 02/27/20. Since the pain started she has undergone R shoulder injections without improvement in her pain. When pt describes her pain she points to the area below her R shoulder blade around T10/T11. She denies weakness in the RUE or any pain in the area of the [facility] joint or shoulder pain. Denies N/T in back or RUE. Pain worsens with extended use of RUE especially when cleaning the counters in her apartment or sweeping the floor. She has a history of L1 and L3 compression fractures in 2017. She currently sees pain management for her R shoulder pain and takes oxycodone to manage the pain. She also struggles with chronic RLE phantom pain s/p amputation and takes gabapentin for this issue. She has a history of multiple vascular surgeries prior to her amputation and also has a history of carpal tunnel syndrome.  Pain:  Pain Intensity: Not rated today Pain location: R back/flank inferior to the shoulder blade Pain Quality: sharp  Radiating: No  Numbness/Tingling: No Focal Weakness: No Aggravating factors: Excessive use of RUE, mopping the floors, wiping countertops, no pain with driving, Relieving factors: heating pad, never tried lidocaine patch, oxycodone, gabapentin doesn't help with R flank/back pain; 24-hour pain behavior: varies depending on activity History of prior shoulder or neck/shoulder injury, pain, surgery, or therapy: Yes Falls: Has patient fallen in last 6 months? No Dominant hand: right Imaging: Yes, however pt denies any imaging of her thoracic or lumbar spine since the pain started  Prior level of function: Independent with community mobility with device, ambulates with RLE prosthesis and LUE single lofstrand crutch Occupational demands:  retired Hobbies: caring for her dogs Red flags (personal history of cancer, chills/fever, night sweats, nausea, vomiting, unrelenting pain): Negative  Precautions: None  Weight Bearing  Restrictions: No  Living Environment Lives with: lives alone Lives in: House/apartment  Patient Goals: Decrease the pain so she can complete her homemaking responsibilities  OBJECTIVE:   Patient Surveys  FOTO: 78, predicted improvement to 24 QuickDASH: Deferred  Cognition Patient is oriented to person, place, and time.  Recent memory is intact.  Remote memory is intact.  Attention span and concentration are intact.  Expressive speech is intact.  Patient's fund of knowledge is within normal limits for educational level.    Gross Musculoskeletal Assessment Tremor: None Bulk: Normal Tone: Normal  Gait Pt ambulates with RLE prosthesis and lofstrand in LUE, decreased self-selected gait speed;  Posture Increased thoracic kyphosis, forward head, and rounded shoulder in sitting and standing;  Cervical Screen AROM: WFL and painless with overpressure in all planes Spurlings A (ipsilateral lateral flexion/axial compression): R: Negative L: Negative Spurlings B (ipsilateral lateral flexion/contralateral rotation/axial compression): R: Negative L: Negative Repeated movement: Not examined Hoffman Sign (cervical cord compression): R: Not examined L: Not examined ULTT Median: R: Not examined L: Not examined ULTT Ulnar: R: Not examined L: Not examined ULTT Radial: R: Not examined L: Not examined  AROM Cervical AROM is painless in all directions and WFL although not full. No reproduction of pain with AROM R shoulder flexion and abduction which is also limited but WFL. No gross deficits noted. Pt denies pain with thoracic flexion, extension, rotation, and lateral flexion.   LE MMT:  MMT (out of 5) Right 07/03/2022 Left 07/03/2022  Cervical (isometric)  Flexion WNL  Extension WNL  Lateral  Flexion WNL WNL  Rotation WNL WNL      Shoulder   Flexion 4+ 4+  Extension    Abduction 4+ 4+  External rotation (seated) 4+ 4+  Internal rotation (seated) 4+ 4+  Horizontal abduction    Horizontal adduction    Lower Trapezius    Rhomboids        Elbow  Flexion 5 5  Extension 5 5  Pronation    Supination        Wrist  Flexion 5 5  Extension 5 5  Radial deviation    Ulnar deviation        MCP  Flexion    Extension    Abduction 5 5  Adduction 5 5  (* = pain; Blank rows = not tested)  Sensation Grossly intact to light touch bilateral UE as determined by testing dermatomes C2-T2. Proprioception and hot/cold testing deferred on this date.  Reflexes Deferred  Palpation Pt is nontender to palpation around entire R shoulder girdle anterior, lateral, and posterior. No pain with palpation to R cervical paraspinals, upper trap, rhomboids, or mid trap. She has pain to palpation along spinous processes from T9-L3 as well as along paraspinals in this region on the right.  Repeated Movements Deferred  Passive Accessory Intervertebral Motion Pt reports reproduction of pain with CPA and bilateral UPA T9-L3. Unable to fully assess mobility secondary to pain however appear grossly hypomobile with notable thoracic kyphosis when prone.  SPECIAL TESTS Rotator Cuff  Drop Arm Test: Negative Painful Arc (Pain from 60 to 120 degrees scaption): Negative Infraspinatus Muscle Test: Negative  Subacromial Impingement Hawkins-Kennedy: Negative Neer (Block scapula, PROM flexion): Negative Painful Arc (Pain from 60 to 120 degrees scaption): Negative Empty Can: Negative External Rotation Resistance: Negative Horizontal Adduction: Not examined Scapular Assist: Not examined  Labral Tear Biceps Load II (120 elevation, full ER, 90 elbow flexion, full supination, resisted elbow flexion): Not examined Crank (160 scaption,  axial load with IR/ER): Not examined Active Compression Test: Not  examined  Bicep Tendon Pathology Speed (shoulder flexion to 90, external rotation, full elbow extension, and forearm supination with resistance: Not examined Yergason's (resisted shoulder ER and supination/biceps tendon pathology): Not examined  Shoulder Instability Sulcus Sign: Not examined Anterior Apprehension: Not examined  Beighton scale: Deferred  Outcome Measures Quick DASH: Deferred FOTO: Pt completed shoulder FOTO but this appears to be more related to back pain. Will update at next therapy session.   TODAY'S TREATMENT   SUBJECTIVE: Pt reports that she is doing alright today. She had her lumbar MRI which showed small lesions scattered throughout her lower thoracic and lumbar spine. She saw [facility] who referred her to have a biopsy of her bone lesions. She also saw Dr. Holley Raring for spinal injection but she was unable to proceed because she had not stopped her Eliquis. She rescheduled the injections for this Wednesday. She denies any resting pain upon arrival. She did have a fall at home yesterday AM when she wsa trying to jump on her LLE.   PAIN: Denies R shoulder/flank pain;   Ther-ex Interval history obtained; Supine SLR with manual resistance 2 x 15 BLE; Supine bolster knee bridges 2 x 15; R sidelying L hip abduction with manual resistance 2 x 15; R sidelying R hip adduction with manual resistance 2 x 15; L sidelying R hip abduction with manual resistance 2 x 15; L sidelying L hip adduction with manual resistance 2 x 15; L sidelying manually resisted L hip abduction 2 x 15; Seated PVC chest press with manual resistance 2 x 15; Seated PVC row with manual resistance 2 x 15;   Not performed: Supine RLE manually resisted long axis ER and IR 2 x 15 each; Supine R shoulder flexion from neutral to 90 with 2# dumbbell (DB) x 10, 3# DB x 10; Supine R shoulder serratus punch at 90 flexion with 2# DB x 10, 3# DB x 10; Supine R shoulder circles at 90 flexion with 2# DB x  10 each direction, 3# DB x 10 each direction; L sidelying R shoulder ER with 3# DB 2 x 10; L sidleying R shoulder abduction with 3# DB 2 x 10; R sidelying L hip clams with manual resistance from therapist 2 x 10; R sidelying L hip reverse clams 2 x 10; Prone R shoulder extension with 3# dumbbell (DB) 2 x 10; Prone R low row with 3# DB 2 x 10; Prone R horizontal abduction with 3# DB 2 x 10; Prone R low trap flexion (Y) with 3# DB 2 x 10; Prone R shoulder ER with 3# DB 2 x 10; Prone L knee flexion with manual resistance 2 x 10 Prone alternating hip extension with manual resistance x 10 BLE; Seated dynamic hug with blue tband 2 x 10; Seated pallof press with green tband x 10 toward each side; Seated scapular retractions 2 x 10; Seated overhead shoulder press with 3# dumbbells x 10, x 5, discontinued second set due to increase in thoracic/R flank pain; Nautilus lat pull down 60# 3 x 10; Seated shoulder flexion with 3# dumbbells x 10; Seated shoulder scaption with 3# dumbbells 2 x 10;   PATIENT EDUCATION:  Education details: Pt educated throughout session about proper posture and technique with exercises. Improved exercise technique, movement at target joints, use of target muscles after min to mod verbal, visual, tactile cues. Person educated: Patient Education method: Explanation, verbal cues, tactile cues, and demonstration Education comprehension:  verbalized understanding and returned demonstration;   [place]: None currently   ASSESSMENT:  CLINICAL IMPRESSION: Continued with back/hip/core/shoulder strengthening during session today. She denies any resting pain upon arrival or increase in pain during session. Frequency will be maintained at 1x/wk. She will benefit from PT services to address deficits in strength and pain in order to return to full function at home with less pain.    REHAB POTENTIAL: Fair    CLINICAL DECISION MAKING:  Unstable/unpredictable  EVALUATION COMPLEXITY: High   GOALS: Goals reviewed with patient? Yes  SHORT TERM GOALS: Target date:  03/14/2022  Pt will be independent with HEP to improve strength and decrease back pain to improve pain-free function at home. Baseline:  Goal status: ONGOING   LONG TERM GOALS: Target date: 04/11/2022  Pt will increase FOTO to at least 57 in order to demonstrate significant improvement in function at home and work related to back pain  Baseline: 02/14/22: To be completed at next visit; 02/22/22: 45; 03/29/22: Deferred; 05/23/22: 70 Goal status: ACHIEVED  2.  Pt will decrease worst back pain by at least 2 points on the NPRS in order to demonstrate clinically significant reduction in back pain. Baseline: 02/14/22: To be completed at next visit; 03/29/22: 10/10; 05/23/22: 8/10; Goal status: ACHIEVED  3.  Pt will report at least 50% improvement in her back symptoms in order to demonstrate clinically significant reduction in disability related to back pain;        Baseline: 03/29/22: 80% improvement since starting with therapy; Goal status: ACHIEVED   PLAN: PT FREQUENCY: 1x/week  PT DURATION: 8 weeks  PLANNED INTERVENTIONS: Therapeutic exercises, Therapeutic activity, Neuromuscular re-education, Balance training, Gait training, Patient/Family education, Joint manipulation, Joint mobilization, Vestibular training, Canalith repositioning, Aquatic Therapy, Dry Needling, Electrical stimulation, Spinal manipulation, Spinal mobilization, Cryotherapy, Moist heat, Traction, Ultrasound, Ionotophoresis '4mg'$ /ml Dexamethasone, and Manual therapy  PLAN FOR NEXT SESSION: Add HEP for LE strengthening, continue manual techniques for R sided back pain and include LE strengthening and gait training,    Phillips Grout PT, DPT, GCS  Physical Therapist- [facility]  07/03/2022, 1:00 PM

## 2022-07-05 ENCOUNTER — Ambulatory Visit
Admission: RE | Admit: 2022-07-05 | Discharge: 2022-07-05 | Disposition: A | Discharge status: Home / Self Care | Payer: Medicare Other | Source: Ambulatory Visit | Attending: Student in an Organized Health Care Education/Training Program | Admitting: Student in an Organized Health Care Education/Training Program

## 2022-07-05 ENCOUNTER — Ambulatory Visit
Payer: Medicare Other | Attending: Student in an Organized Health Care Education/Training Program | Admitting: Student in an Organized Health Care Education/Training Program

## 2022-07-05 ENCOUNTER — Encounter: Payer: Self-pay | Admitting: Student in an Organized Health Care Education/Training Program

## 2022-07-05 VITALS — BP 134/71 | HR 72 | Temp 97.4°F | Resp 16 | Ht 64.0 in | Wt 123.0 lb

## 2022-07-05 DIAGNOSIS — M47894 Other spondylosis, thoracic region: Secondary | ICD-10-CM | POA: Diagnosis not present

## 2022-07-05 DIAGNOSIS — M12811 Other specific arthropathies, not elsewhere classified, right shoulder: Secondary | ICD-10-CM | POA: Insufficient documentation

## 2022-07-05 DIAGNOSIS — G894 Chronic pain syndrome: Secondary | ICD-10-CM | POA: Diagnosis not present

## 2022-07-05 MED ORDER — MIDAZOLAM HCL 5 MG/5ML IJ SOLN
0.5000 mg | Freq: Once | INTRAMUSCULAR | Status: AC
  Administered 2022-07-05: 2 mg via INTRAVENOUS
  Filled 2022-07-05: qty 2

## 2022-07-05 MED ORDER — ROPIVACAINE HCL 2 MG/ML IJ SOLN
9.0000 mL | Freq: Once | INTRAMUSCULAR | Status: AC
  Administered 2022-07-05: 9 mL via PERINEURAL

## 2022-07-05 MED ORDER — LIDOCAINE HCL 2 % IJ SOLN
20.0000 mL | Freq: Once | INTRAMUSCULAR | Status: AC
  Administered 2022-07-05: 400 mg

## 2022-07-05 MED ORDER — DEXAMETHASONE SODIUM PHOSPHATE 10 MG/ML IJ SOLN
10.0000 mg | Freq: Once | INTRAMUSCULAR | Status: AC
  Administered 2022-07-05: 10 mg

## 2022-07-05 MED ORDER — MIDAZOLAM HCL 2 MG/2ML IJ SOLN
INTRAMUSCULAR | Status: AC
  Filled 2022-07-05: qty 2

## 2022-07-05 MED ORDER — DEXAMETHASONE SODIUM PHOSPHATE 10 MG/ML IJ SOLN
INTRAMUSCULAR | Status: AC
  Filled 2022-07-05: qty 1

## 2022-07-05 MED ORDER — LIDOCAINE HCL 2 % IJ SOLN
INTRAMUSCULAR | Status: AC
  Filled 2022-07-05: qty 20

## 2022-07-05 MED ORDER — ROPIVACAINE HCL 2 MG/ML IJ SOLN
INTRAMUSCULAR | Status: AC
  Filled 2022-07-05: qty 20

## 2022-07-05 MED ORDER — LACTATED RINGERS IV SOLN: Freq: Once | INTRAVENOUS | Status: AC

## 2022-07-05 NOTE — Patient Instructions (Signed)

## 2022-07-05 NOTE — Progress Notes (Signed)
PROVIDER NOTE: Interpretation of information contained herein should be left to medically-trained personnel. Specific patient instructions are provided elsewhere under "Patient Instructions" section of medical record. This document was created in part using STT-dictation technology, any transcriptional errors that may result from this process are unintentional.  Patient: Sara [name] Type: Established DOB: 09/01/1950 MRN: [id number] PCP: Danelle Berry, NP  Service: Procedure DOS: 07/05/2022 Setting: Ambulatory Location: Ambulatory outpatient facility Delivery: Face-to-face Provider: Gillis Santa, MD Specialty: Interventional Pain Management Specialty designation: 09 Location: Outpatient facility Ref. Prov.: Danelle Berry, NP    Primary Reason for Visit: Interventional Pain Management Treatment. CC: Shoulder Pain (right)   Procedure #1:    [Reference CPT: 64461(single); 64462(additional levels)]   Type: Thoracic facet medial branch block #1  Laterality: Right (-RT)  Level: T5 and T6 Medial Branch Level(s)  Imaging: Fluoroscopy-guided         Anesthesia: Local anesthesia (1-2% Lidocaine) Anxiolysis: IV Versed  DOS: 07/05/2022  Performed by: Gillis Santa, MD  Purpose: Diagnostic/Therapeutic Indications: Thoracic back pain severe enough to impact quality of life or function. Rationale (medical necessity): procedure needed and proper for the diagnosis and/or treatment of Ms. Craze's medical symptoms and needs. 1. Thoracic facet joint syndrome   2. Rotator cuff arthropathy of right shoulder   3. Chronic pain syndrome    NAS-11 Pain score:   Pre-procedure: 0-No pain/10   Post-procedure: 0-No pain/10     Target: Thoracic posterior (dorsal) primary rami nerve Location: 2.5 cm lateral to midline (spinous process), just cephalad to upper transverse process edge.  (needle tip placement) Region: Thoracic  Approach: Paravertebral  Type of procedure: Percutaneous  perineural nerve block   Pertinent Anatomy: There are two potential fascial compartments in the TPVS: the anterior extrapleural paravertebral compartment and the posterior subendothoracic paravertebral compartment. The transverse process and the superior costotransverse ligament form the posterior boundary.  Position / Prep / Materials:  Position: Prone  Prep solution: DuraPrep (Iodine Povacrylex [0.7% available iodine] and Isopropyl Alcohol, 74% w/w) Prep Area: Entire upper back region Materials:  Tray: Block Needle(s):  Type: Spinal  Gauge (G): 22  Length: 3.5-in  Qty: 2  Pre-op H&P Assessment:  Sara [name] is a 71 y.o. (year old), female patient, seen today for interventional treatment. She  has a past surgical history that includes Vascular surgery ([id number]); Hemorroidectomy (2014); Esophagogastroduodenoscopy (N/A, 12/21/2014); Esophagogastroduodenoscopy (egd) with propofol (N/A, 02/08/2016); Cardiac catheterization (N/A, 02/09/2016); Cardiac catheterization (Right, 02/10/2016); Cardiac catheterization (02/15/2017); Colonoscopy with propofol (N/A, 10/22/2017); Colonoscopy with propofol (N/A, 11/05/2017); polypectomy (11/05/2017); Tonsillectomy; Adenoidectomy; Lower Extremity Angiography (Right, 05/30/2019); Fasciotomy closure (Right, 06/04/2019); Artery Biopsy (Right, 07/14/2019); Fasciotomy (Right, 05/30/2019); Breast cyst aspiration (Left); Shoulder arthroscopy with rotator cuff repair and subacromial decompression (Right, 02/27/2020); Lower Extremity Angiography (Right, 07/02/2020); and Amputation (Right, 08/11/2020). Sara [name] has a current medication list which includes the following prescription(s): alendronate, apixaban, atorvastatin, calcium carbonate, cholecalciferol, digoxin, duloxetine, gabapentin, losartan, omeprazole, oxycodone-acetaminophen, actemra actpen, zolpidem, and digoxin. Her primarily concern today is the Shoulder Pain (right)  Initial Vital Signs:  Pulse/HCG Rate: 73  Temp:  (!) 97.4 F (36.3 C) Resp: 18 BP: 139/76 SpO2: 96 %  BMI: Estimated body mass index is 21.11 kg/m as calculated from the following:   Height as of this encounter: '5\' 4"'$  (5.284 m).   Weight as of this encounter: 123 lb (55.8 kg).  Risk Assessment: Allergies: Reviewed. She is allergic to hydrocodone-acetaminophen, amoxicillin, metoprolol tartrate, oxycodone, and vicodin [hydrocodone-acetaminophen].  Allergy Precautions: None required Coagulopathies: Reviewed. None identified.  Blood-thinner therapy: None at this time Active Infection(s): Reviewed. None identified. Sara [name] is afebrile  Site Confirmation: Sara [name] was asked to confirm the procedure and laterality before marking the site Procedure checklist: Completed Consent: Before the procedure and under the influence of no sedative(s), amnesic(s), or anxiolytics, the patient was informed of the treatment options, risks and possible complications. To fulfill our ethical and legal obligations, as recommended by the American Medical Association's Code of Ethics, I have informed the patient of my clinical impression; the nature and purpose of the treatment or procedure; the risks, benefits, and possible complications of the intervention; the alternatives, including doing nothing; the risk(s) and benefit(s) of the alternative treatment(s) or procedure(s); and the risk(s) and benefit(s) of doing nothing. The patient was provided information about the general risks and possible complications associated with the procedure. These may include, but are not limited to: failure to achieve desired goals, infection, bleeding, organ or nerve damage, allergic reactions, paralysis, and death. In addition, the patient was informed of those risks and complications associated to Spine-related procedures, such as failure to decrease pain; infection (i.e.: Meningitis, epidural or intraspinal abscess); bleeding (i.e.: epidural hematoma, subarachnoid hemorrhage,  or any other type of intraspinal or peri-dural bleeding); organ or nerve damage (i.e.: Any type of peripheral nerve, nerve root, or spinal cord injury) with subsequent damage to sensory, motor, and/or autonomic systems, resulting in permanent pain, numbness, and/or weakness of one or several areas of the body; allergic reactions; (i.e.: anaphylactic reaction); and/or death. Furthermore, the patient was informed of those risks and complications associated with the medications. These include, but are not limited to: allergic reactions (i.e.: anaphylactic or anaphylactoid reaction(s)); adrenal axis suppression; blood sugar elevation that in diabetics may result in ketoacidosis or comma; water retention that in patients with history of congestive heart failure may result in shortness of breath, pulmonary edema, and decompensation with resultant heart failure; weight gain; swelling or edema; medication-induced neural toxicity; particulate matter embolism and blood vessel occlusion with resultant organ, and/or nervous system infarction; and/or aseptic necrosis of one or more joints. Finally, the patient was informed that Medicine is not an exact science; therefore, there is also the possibility of unforeseen or unpredictable risks and/or possible complications that may result in a catastrophic outcome. The patient indicated having understood very clearly. We have given the patient no guarantees and we have made no promises. Enough time was given to the patient to ask questions, all of which were answered to the patient's satisfaction. Ms. Sperry has indicated that she wanted to continue with the procedure. Attestation: I, the ordering provider, attest that I have discussed with the patient the benefits, risks, side-effects, alternatives, likelihood of achieving goals, and potential problems during recovery for the procedure that I have provided informed consent. Date  Time: 07/05/2022  9:32 AM  Pre-Procedure  Preparation:  Monitoring: As per clinic protocol. Respiration, ETCO2, SpO2, BP, heart rate and rhythm monitor placed and checked for adequate function Safety Precautions: Patient was assessed for positional comfort and pressure points before starting the procedure. Time-out: I initiated and conducted the "Time-out" before starting the procedure, as per protocol. The patient was asked to participate by confirming the accuracy of the "Time Out" information. Verification of the correct person, site, and procedure were performed and confirmed by me, the nursing staff, and the patient. "Time-out" conducted as per Joint Commission's Universal Protocol (UP.01.01.01). Time: 1054  Description/Narrative of Procedure:          Technical description of  procedure: Bilateral T5, T6 facet medial branch nerve block Rationale (medical necessity): procedure needed and proper for the diagnosis and/or treatment of the patient's medical symptoms and needs. Procedural Technique Safety Precautions: Aspiration looking for blood return was conducted prior to all injections. At no point did we inject any substances, as a needle was being advanced. No attempts were made at seeking any paresthesias. Safe injection practices and needle disposal techniques used. Medications properly checked for expiration dates. SDV (single dose vial) medications used. Target Area: For the thoracic medial branch nerve, the target is located between the top of the transverse processes at the point where the transverse process joints the vertebra and the superior-lateral aspect of the transverse process.  (More lateral towards the superior aspect of the thoracic spine.) For safety reasons and to minimize the risk of hemo- or pneumothorax, the needle tip was not advanced past the boundary of the posterior paravertebral compartment (superior costotransverse ligament). Description of the Procedure: Protocol guidelines were followed. The patient was placed  in position over the fluoroscopy table. The target area was identified and the area prepped in the usual manner. Skin & deeper tissues infiltrated with local anesthetic. Appropriate amount of time allowed to pass for local anesthetics to take effect. The procedure needles were then advanced to the target area. Proper needle placement secured. Negative aspiration confirmed. Solution injected in intermittent fashion, asking for systemic symptoms every 0.5cc of injectate. The needles were then removed and the area cleansed, making sure to leave some of the prepping solution back to take advantage of its long term bactericidal properties.  8 cc solution containing 6 cc of 0.2% ropivacaine, 2 cc of Decadron 10 mg/cc.  2 cc injected at each level above bilaterally.          Vitals:   07/05/22 1050 07/05/22 1055 07/05/22 1057 07/05/22 1100  BP: (!) 147/89 131/79 125/76 134/71  Pulse: 88 82 80 72  Resp: '16 17 16 16  '$ Temp:      TempSrc:      SpO2: 97% 95% 97% 98%  Weight:      Height:         Start Time: 1054 hrs. End Time: 1057 hrs.  Imaging Guidance:          Type of Imaging Technique: Fluoroscopy Guidance (Spinal) Indication(s): Assistance in needle guidance and placement for procedures requiring needle placement in or near specific anatomical locations not easily accessible without such assistance. Exposure Time: Please see nurses notes. Contrast: None used. Fluoroscopic Guidance: I was personally present during the use of fluoroscopy. "Tunnel Vision Technique" used to obtain the best possible view of the target area. Parallax error corrected before commencing the procedure. "Direction-depth-direction" technique used to introduce the needle under continuous pulsed fluoroscopy. Once target was reached, antero-posterior, oblique, and lateral fluoroscopic projection used confirm needle placement in all planes. Images permanently stored in EMR. Ultrasound Guidance: N/A Interpretation:  N/A  Post-operative Assessment:  Post-procedure Vital Signs:  Pulse/HCG Rate: 72  Temp: (!) 97.4 F (36.3 C) Resp: 16 BP: 134/71 SpO2: 98 %  EBL: None  Complications: No immediate post-treatment complications observed by team, or reported by patient.  Note: The patient tolerated the entire procedure well. A repeat set of vitals were taken after the procedure and the patient was kept under observation following institutional policy, for this type of procedure. Post-procedural neurological assessment was performed, showing return to baseline, prior to discharge. The patient was provided with post-procedure discharge instructions, including a section  on how to identify potential problems. Should any problems arise concerning this procedure, the patient was given instructions to immediately contact us, at any time, without hesitation. In any case, we plan to contact the patient by telephone for a follow-up status report regarding this interventional procedure.  Comments:  No additional relevant information.  Plan of Care  Orders:  Orders Placed This Encounter  Procedures   DG PAIN [facility] C-ARM 1-60 MIN NO REPORT    Intraoperative interpretation by procedural physician at [place].    Standing Status:   Standing    Number of Occurrences:   1    Order Specific Question:   Reason for exam:    Answer:   Assistance in needle guidance and placement for procedures requiring needle placement in or near specific anatomical locations not easily accessible without such assistance.     Medications ordered for procedure: Meds ordered this encounter  Medications   lidocaine (XYLOCAINE) 2 % (with pres) injection 400 mg   lactated ringers infusion   midazolam (VERSED) 5 MG/5ML injection 0.5-2 mg    Make sure Flumazenil is available in the pyxis when using this medication. If oversedation occurs, administer 0.2 mg IV over 15 sec. If after 45 sec no response, administer 0.2 mg again  over 1 min; may repeat at 1 min intervals; not to exceed 4 doses (1 mg)   dexamethasone (DECADRON) injection 10 mg   ropivacaine (PF) 2 mg/mL (0.2%) (NAROPIN) injection 9 mL   Medications administered: We administered lidocaine, lactated ringers, midazolam, dexamethasone, and ropivacaine (PF) 2 mg/mL (0.2%).  See the medical record for exact dosing, route, and time of administration.  Follow-up plan:   Return in about 6 weeks (around 08/16/2022) for Post Procedure Evaluation, virtual.       1.  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  Discussed treatment options which include titration of gabapentin to a more therapeutic dose.  Can also consider trial of Lyrica in future.  2.  Consider lidocaine infusion for right lower extremity phantom limb pain.  QTc checked from EKG in November 2022 and is appropriate.  Will need updated EKG if we are considering lidocaine infusion.  3.  Consider spinal cord stimulation for phantom limb pain.  Informed patient that there is mixed evidence on this but this could be a treatment option.  4.  Right rotator cuff arthropathy and dysfunction: Consider right suprascapular nerve block and possible RFA or suprascapular peripheral nerve stimulation.     Recent Visits Date Type Provider Dept  06/26/22 Procedure visit Gillis Santa, MD [facility]  06/08/22 Office Visit Gillis Santa, MD [facility]  04/26/22 Office Visit Gillis Santa, MD [facility]  Showing recent visits within past 90 days and meeting all other requirements Today's Visits Date Type Provider Dept  07/05/22 Procedure visit Gillis Santa, MD [facility]  Showing today's visits and meeting all other requirements Future Appointments Date Type Provider Dept  07/17/22 Appointment Gillis Santa, MD [facility]  Showing future appointments within next 90 days and meeting all other requirements  Disposition: Discharge home   Discharge (Date  Time): 07/05/2022; 1106 hrs.   Primary Care Physician: Danelle Berry, NP Location: [facility] Note by: Gillis Santa, MD Date: 07/05/2022; Time: 2:10 PM  Disclaimer:  Medicine is not an exact science. The only guarantee in medicine is that nothing is guaranteed. It is important to note that the decision to proceed with  this intervention was based on the information collected from the patient. The Data and conclusions were drawn from the patient's questionnaire, the interview, and the physical examination. Because the information was provided in large part by the patient, it cannot be guaranteed that it has not been purposely or unconsciously manipulated. Every effort has been made to obtain as much relevant data as possible for this evaluation. It is important to note that the conclusions that lead to this procedure are derived in large part from the available data. Always take into account that the treatment will also be dependent on availability of resources and existing treatment guidelines, considered by other Pain Management Practitioners as being common knowledge and practice, at the time of the intervention. For Medico-Legal purposes, it is also important to point out that variation in procedural techniques and pharmacological choices are the acceptable norm. The indications, contraindications, technique, and results of the above procedure should only be interpreted and judged by a Board-Certified Interventional Pain Specialist with extensive familiarity and expertise in the same exact procedure and technique.

## 2022-07-05 NOTE — Progress Notes (Signed)
Safety precautions to be maintained throughout the outpatient stay will include: orient to surroundings, keep bed in low position, maintain call bell within reach at all times, provide assistance with transfer out of bed and ambulation.  

## 2022-07-06 ENCOUNTER — Telehealth: Payer: Self-pay | Admitting: *Deleted

## 2022-07-06 ENCOUNTER — Ambulatory Visit: Payer: Medicare Other | Admitting: Internal Medicine

## 2022-07-06 NOTE — Telephone Encounter (Signed)
No problems post procedure. 

## 2022-07-09 NOTE — Therapy (Signed)
OUTPATIENT PHYSICAL THERAPY SHOULDER TREATMENT  Patient Name: Sara [name] MRN: [id number] DOB:Feb 01, 1951, 71 y.o., female Today's Date: 07/10/2022   PT End of Session - 07/10/22 1108     Visit Number 24    Number of Visits 33    Date for PT Re-Evaluation 07/18/22    Authorization Type eval: 9/98/33, recert: 82/5/05    PT Start Time 1102    PT Stop Time 1145    PT Time Calculation (min) 43 min    Activity Tolerance Patient tolerated treatment well    Behavior During Therapy [facility] for tasks assessed/performed            Past Medical History:  Diagnosis Date   Arthritis    right shoulder   Depression    Dyspnea    GERD (gastroesophageal reflux disease)    History of blood clots    Hyperlipidemia    Hypertension    Mild mitral regurgitation    Osteoporosis    Peripheral vascular disease ([place])    Stomach ulcer    Vascular disease    Sees Dr. Delana Meyer   Vertigo    Last episode approx Aug 2015   Wears dentures    full upper   Past Surgical History:  Procedure Laterality Date   ADENOIDECTOMY     AMPUTATION Right 08/11/2020   Procedure: AMPUTATION ABOVE KNEE;  Surgeon: [name], MD;  Location: [facility];  Service: Vascular;  Laterality: Right;   ARTERY BIOPSY Right 07/14/2019   Procedure: BIOPSY TEMPORAL ARTERY;  Surgeon: [name], MD;  Location: [facility];  Service: Vascular;  Laterality: Right;   BREAST CYST ASPIRATION Left    CARDIAC CATHETERIZATION  02/15/2017   [facility]   COLONOSCOPY WITH PROPOFOL N/A 10/22/2017   Procedure: COLONOSCOPY WITH PROPOFOL;  Surgeon: Lucilla Lame, MD;  Location: [place];  Service: Endoscopy;  Laterality: N/A;  specimens not taken--pt on Plavix will be brought back in after 7 days off med   COLONOSCOPY WITH PROPOFOL N/A 11/05/2017   Procedure: COLONOSCOPY WITH PROPOFOL;  Surgeon: Lucilla Lame, MD;  Location: [place];  Service: Endoscopy;  Laterality: N/A;   ESOPHAGOGASTRODUODENOSCOPY N/A 12/21/2014    Procedure: ESOPHAGOGASTRODUODENOSCOPY (EGD);  Surgeon: Lucilla Lame, MD;  Location: [place];  Service: Gastroenterology;  Laterality: N/A;   ESOPHAGOGASTRODUODENOSCOPY (EGD) WITH PROPOFOL N/A 02/08/2016   Procedure: ESOPHAGOGASTRODUODENOSCOPY (EGD) WITH PROPOFOL;  Surgeon: Lucilla Lame, MD;  Location: [facility];  Service: Endoscopy;  Laterality: N/A;   FASCIOTOMY Right 05/30/2019   Procedure: FASCIOTOMY;  Surgeon: [name], MD;  Location: [facility];  Service: Vascular;  Laterality: Right;   FASCIOTOMY CLOSURE Right 06/04/2019   Procedure: FASCIOTOMY CLOSURE;  Surgeon: [name], MD;  Location: [facility];  Service: Vascular;  Laterality: Right;   HEMORROIDECTOMY  2014   LOWER EXTREMITY ANGIOGRAPHY Right 05/30/2019   Procedure: LOWER EXTREMITY ANGIOGRAPHY;  Surgeon: [name], MD;  Location: [place] [facility];  Service: Cardiovascular;  Laterality: Right;   LOWER EXTREMITY ANGIOGRAPHY Right 07/02/2020   Procedure: LOWER EXTREMITY ANGIOGRAPHY;  Surgeon: [name], MD;  Location: [place] [facility];  Service: Cardiovascular;  Laterality: Right;   PERIPHERAL VASCULAR CATHETERIZATION N/A 02/09/2016   Procedure: Abdominal Aortogram w/Lower Extremity;  Surgeon: [name], MD;  Location: [place] [facility];  Service: Cardiovascular;  Laterality: N/A;   PERIPHERAL VASCULAR CATHETERIZATION Right 02/10/2016   Procedure: Lower Extremity Angiography;  Surgeon: [name], MD;  [facility];  Service: Cardiovascular;  Laterality: Right;   POLYPECTOMY  11/05/2017   Procedure: POLYPECTOMY;  Surgeon: Lucilla Lame, MD;  Location: [place];  Service: Endoscopy;;   SHOULDER ARTHROSCOPY WITH ROTATOR CUFF REPAIR AND SUBACROMIAL DECOMPRESSION Right 02/27/2020   Procedure: RIGHT SHOULDER ARTHROSCOPY SUBACROMIAL DECOMPRESSION, DISTAL CLAVICLE EXCISION AND MINI-OPEN ROTATOR CUFF REPAIR;  Surgeon: Thornton Park, MD;  Location:  [facility];  Service: Orthopedics;  Laterality: Right;   TONSILLECTOMY     VASCULAR SURGERY  [id number]   Fem-Pop Bypass   Patient Active Problem List   Diagnosis Date Noted   Thoracic facet joint syndrome 06/08/2022   Bone lesion 06/08/2022   IgA monoclonal gammopathy 05/18/2022   Degeneration of lumbar intervertebral disc 01/04/2022   Shoulder pain 01/04/2022   Pain in joint of right shoulder 01/04/2022   Spasm of back muscles 01/04/2022   Thoracic back pain 01/04/2022   History of left above knee amputation [facility]) (Dec 2021) 12/27/2021   Rotator cuff arthropathy of right shoulder 12/27/2021   Hx of rotator cuff surgery 12/27/2021   Chronic pain syndrome 12/27/2021   Adhesive capsulitis of right shoulder 12/05/2021   Phantom pain ([place]) 11/10/2021   Muscle spasm 09/09/2020   S/P AKA (above knee amputation), right ([place]) 09/08/2020   Ischemia of extremity 07/02/2020   Chronic pain in right shoulder 11/25/2019   Encounter for long-term (current) use of high-risk medication 07/16/2019   GCA (giant cell arteritis) ([place]) 07/16/2019   Temporal arteritis ([place]) 07/13/2019   Postoperative wound infection 06/23/2019   Ischemic leg 05/31/2019   Atherosclerosis of native arteries of extremity with intermittent claudication ([place]) 05/30/2019   Depression 05/29/2019   Eczema of lower extremity 03/04/2018   Chronic venous insufficiency 03/02/2018   Personal history of colonic polyps    Family history of colonic polyps    Benign neoplasm of ascending colon    Mitral valve insufficiency 02/08/2017   Dyspnea on exertion 02/07/2017   Non-rheumatic mitral regurgitation 02/07/2017   Precordial pain 02/07/2017   CAD (coronary artery disease) 12/21/2016   Essential hypertension 12/21/2016   Compression fracture of lumbar spine, non-traumatic ([place]) 04/26/2016   Compression fracture of L3 lumbar vertebra 04/20/2016   PVD (peripheral vascular disease) ([place]) 02/24/2016   Family history of premature CAD  02/24/2016   Gastritis    Other specified diseases of esophagus    Hiatal hernia    Gastritis and gastroduodenitis    Ischemia of lower extremity    Arterial occlusion ([place])    Atherosclerosis of aorta ([place])    History of smoking    Hyperlipidemia    Pain in the chest    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 02/05/2016   Carotid artery stenosis 01/04/2016   Vertigo, benign paroxysmal 12/28/2015   Clinical depression 09/06/2015   History of alcoholism ([place]) 09/06/2015   Angiopathy, 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[place]) 09/06/2015   Atherosclerosis of native arteries of extremity with rest pain ([place]) 09/06/2015   Acute non-recurrent maxillary sinusitis 07/14/2015   Vaginal pruritus 05/26/2015   Chronic recurrent major depressive disorder ([place]) 03/09/2015   Osteoporosis, post-menopausal 03/09/2015   Peripheral blood vessel disorder ([place]) 03/09/2015   Acid reflux 03/09/2015   GERD (gastroesophageal reflux disease) 12/21/2014   Carotid artery narrowing 01/28/2014   Peripheral arterial occlusive disease ([place]) 06/08/2011   Occlusion and stenosis of unspecified carotid artery 06/08/2011   PAD (peripheral artery disease) ([place]) 06/08/2011   PCP: Danelle Berry, NP  REFERRING PROVIDER: Barrie Lyme MD  REFERRING  DIAGNOSIS: S29.012A (ICD-10-CM) - Strain of muscle and tendon of back wall of thorax, initial encounter  THERAPY DIAG: Muscle weakness (generalized)  Other low back pain  Chronic right shoulder pain  RATIONALE FOR EVALUATION AND TREATMENT: Rehabilitation  ONSET DATE: 08/11/20 (approximate)  FROM INITIAL EVALUATION  SUBJECTIVE:                                                                                                                                                                                         Chief Complaint: R thoracic/rib pain  Pertinent History Pt reports she she started experiencing R shoulder pain which started shortly after her RLE  amputation (08/11/20). She attributes the pain to having to self-propel a wheelchair for an extended period of time. She has seen Dr. Mack Guise at [facility] in the past and underwent a R shoulder mini-open rotator cuff repair, DCE, and SAD on 02/27/20. Since the pain started she has undergone R shoulder injections without improvement in her pain. When pt describes her pain she points to the area below her R shoulder blade around T10/T11. She denies weakness in the RUE or any pain in the area of the [facility] joint or shoulder pain. Denies N/T in back or RUE. Pain worsens with extended use of RUE especially when cleaning the counters in her apartment or sweeping the floor. She has a history of L1 and L3 compression fractures in 2017. She currently sees pain management for her R shoulder pain and takes oxycodone to manage the pain. She also struggles with chronic RLE phantom pain s/p amputation and takes gabapentin for this issue. She has a history of multiple vascular surgeries prior to her amputation and also has a history of carpal tunnel syndrome.  Pain:  Pain Intensity: Not rated today Pain location: R back/flank inferior to the shoulder blade Pain Quality: sharp  Radiating: No  Numbness/Tingling: No Focal Weakness: No Aggravating factors: Excessive use of RUE, mopping the floors, wiping countertops, no pain with driving, Relieving factors: heating pad, never tried lidocaine patch, oxycodone, gabapentin doesn't help with R flank/back pain; 24-hour pain behavior: varies depending on activity History of prior shoulder or neck/shoulder injury, pain, surgery, or therapy: Yes Falls: Has patient fallen in last 6 months? No Dominant hand: right Imaging: Yes, however pt denies any imaging of her thoracic or lumbar spine since the pain started  Prior level of function: Independent with community mobility with device, ambulates with RLE prosthesis and LUE single lofstrand crutch Occupational demands:  retired Hobbies: caring for her dogs Red flags (personal history of cancer, chills/fever, night sweats, nausea, vomiting, unrelenting pain): Negative  Precautions: None  Weight Bearing  Restrictions: No  Living Environment Lives with: lives alone Lives in: House/apartment  Patient Goals: Decrease the pain so she can complete her homemaking responsibilities  OBJECTIVE:   Patient Surveys  FOTO: 30, predicted improvement to 92 QuickDASH: Deferred  Cognition Patient is oriented to person, place, and time.  Recent memory is intact.  Remote memory is intact.  Attention span and concentration are intact.  Expressive speech is intact.  Patient's fund of knowledge is within normal limits for educational level.    Gross Musculoskeletal Assessment Tremor: None Bulk: Normal Tone: Normal  Gait Pt ambulates with RLE prosthesis and lofstrand in LUE, decreased self-selected gait speed;  Posture Increased thoracic kyphosis, forward head, and rounded shoulder in sitting and standing;  Cervical Screen AROM: WFL and painless with overpressure in all planes Spurlings A (ipsilateral lateral flexion/axial compression): R: Negative L: Negative Spurlings B (ipsilateral lateral flexion/contralateral rotation/axial compression): R: Negative L: Negative Repeated movement: Not examined Hoffman Sign (cervical cord compression): R: Not examined L: Not examined ULTT Median: R: Not examined L: Not examined ULTT Ulnar: R: Not examined L: Not examined ULTT Radial: R: Not examined L: Not examined  AROM Cervical AROM is painless in all directions and WFL although not full. No reproduction of pain with AROM R shoulder flexion and abduction which is also limited but WFL. No gross deficits noted. Pt denies pain with thoracic flexion, extension, rotation, and lateral flexion.   LE MMT:  MMT (out of 5) Right 07/10/2022 Left 07/10/2022  Cervical (isometric)  Flexion WNL  Extension WNL  Lateral  Flexion WNL WNL  Rotation WNL WNL      Shoulder   Flexion 4+ 4+  Extension    Abduction 4+ 4+  External rotation (seated) 4+ 4+  Internal rotation (seated) 4+ 4+  Horizontal abduction    Horizontal adduction    Lower Trapezius    Rhomboids        Elbow  Flexion 5 5  Extension 5 5  Pronation    Supination        Wrist  Flexion 5 5  Extension 5 5  Radial deviation    Ulnar deviation        MCP  Flexion    Extension    Abduction 5 5  Adduction 5 5  (* = pain; Blank rows = not tested)  Sensation Grossly intact to light touch bilateral UE as determined by testing dermatomes C2-T2. Proprioception and hot/cold testing deferred on this date.  Reflexes Deferred  Palpation Pt is nontender to palpation around entire R shoulder girdle anterior, lateral, and posterior. No pain with palpation to R cervical paraspinals, upper trap, rhomboids, or mid trap. She has pain to palpation along spinous processes from T9-L3 as well as along paraspinals in this region on the right.  Repeated Movements Deferred  Passive Accessory Intervertebral Motion Pt reports reproduction of pain with CPA and bilateral UPA T9-L3. Unable to fully assess mobility secondary to pain however appear grossly hypomobile with notable thoracic kyphosis when prone.  SPECIAL TESTS Rotator Cuff  Drop Arm Test: Negative Painful Arc (Pain from 60 to 120 degrees scaption): Negative Infraspinatus Muscle Test: Negative  Subacromial Impingement Hawkins-Kennedy: Negative Neer (Block scapula, PROM flexion): Negative Painful Arc (Pain from 60 to 120 degrees scaption): Negative Empty Can: Negative External Rotation Resistance: Negative Horizontal Adduction: Not examined Scapular Assist: Not examined  Labral Tear Biceps Load II (120 elevation, full ER, 90 elbow flexion, full supination, resisted elbow flexion): Not examined Crank (160 scaption,  axial load with IR/ER): Not examined Active Compression Test: Not  examined  Bicep Tendon Pathology Speed (shoulder flexion to 90, external rotation, full elbow extension, and forearm supination with resistance: Not examined Yergason's (resisted shoulder ER and supination/biceps tendon pathology): Not examined  Shoulder Instability Sulcus Sign: Not examined Anterior Apprehension: Not examined  Beighton scale: Deferred  Outcome Measures Quick DASH: Deferred FOTO: Pt completed shoulder FOTO but this appears to be more related to back pain. Will update at next therapy session.   TODAY'S TREATMENT   SUBJECTIVE: Pt reports that she is having tailbone pain today. She fell last Thursday when trying to get out of bed and landed on the floor. She now has pain in sitting. She has a donut pillow which helps considerably. She saw Dr. Holley Raring and had thoracic spinal injections. She has not had any more R shoulder/flank pain however pt reports she has not been doing her regular activity. She denies any resting pain upon arrival.    PAIN: Denies R shoulder/flank pain;   Ther-ex Interval history obtained; Assessment of tailbone pain with second therapist Myles Gip DPT, pt has tenderness to palpation starting just superior and lateral to coccyx and continuing down toward the tailbone. No relieve with coccyx extension. No notable bruising or swelling around the area. No worsening pain and no improvement with eccentric pelvic floor contraction. Supine SLR with manual resistance 2 x 15 BLE; Supine bolster knee bridges 2 x 15; R sidelying L hip abduction with manual resistance 2 x 15; R sidelying R hip adduction with manual resistance 2 x 15; L sidelying R hip abduction with manual resistance 2 x 15; L sidelying L hip adduction with manual resistance 2 x 15; L sidelying manually resisted L hip abduction 2 x 15; Supine PVC chest press with manual resistance 2 x 15; Supine PVC row with manual resistance 2 x 15;   Not performed: Supine RLE manually resisted long  axis ER and IR 2 x 15 each; Supine R shoulder flexion from neutral to 90 with 2# dumbbell (DB) x 10, 3# DB x 10; Supine R shoulder serratus punch at 90 flexion with 2# DB x 10, 3# DB x 10; Supine R shoulder circles at 90 flexion with 2# DB x 10 each direction, 3# DB x 10 each direction; L sidelying R shoulder ER with 3# DB 2 x 10; L sidleying R shoulder abduction with 3# DB 2 x 10; R sidelying L hip clams with manual resistance from therapist 2 x 10; R sidelying L hip reverse clams 2 x 10; Prone R shoulder extension with 3# dumbbell (DB) 2 x 10; Prone R low row with 3# DB 2 x 10; Prone R horizontal abduction with 3# DB 2 x 10; Prone R low trap flexion (Y) with 3# DB 2 x 10; Prone R shoulder ER with 3# DB 2 x 10; Prone L knee flexion with manual resistance 2 x 10 Prone alternating hip extension with manual resistance x 10 BLE; Seated dynamic hug with blue tband 2 x 10; Seated pallof press with green tband x 10 toward each side; Seated scapular retractions 2 x 10; Seated overhead shoulder press with 3# dumbbells x 10, x 5, discontinued second set due to increase in thoracic/R flank pain; Nautilus lat pull down 60# 3 x 10; Seated shoulder flexion with 3# dumbbells x 10; Seated shoulder scaption with 3# dumbbells 2 x 10;   PATIENT EDUCATION:  Education details: Pt educated throughout session about proper posture and technique  with exercises. Improved exercise technique, movement at target joints, use of target muscles after min to mod verbal, visual, tactile cues. Person educated: Patient Education method: Explanation, verbal cues, tactile cues, and demonstration Education comprehension: verbalized understanding and returned demonstration;   HOME EXERCISE PROGRAM: None currently   ASSESSMENT:  CLINICAL IMPRESSION: Continued with back/hip/core/shoulder strengthening during session today. Assessment of tailbone pain with second therapist Myles Gip DPT performed during session  secondary to recent fall and ongoing pain. Pt has tenderness to palpation starting just superior and lateral to coccyx and continuing down toward the tailbone. No relief with coccyx extension. No notable bruising or swelling around the area. No worsening pain and no improvement with eccentric pelvic floor contraction. Pt advised that if she is concerned she can contact PCP but there is little that can be done for this issue that pt hasn't already implemented. Frequency will be maintained at 1x/wk. She will benefit from PT services to address deficits in strength and pain in order to return to full function at home with less pain.    REHAB POTENTIAL: Fair    CLINICAL DECISION MAKING: Unstable/unpredictable  EVALUATION COMPLEXITY: High   GOALS: Goals reviewed with patient? Yes  SHORT TERM GOALS: Target date:  03/14/2022  Pt will be independent with HEP to improve strength and decrease back pain to improve pain-free function at home. Baseline:  Goal status: ONGOING   LONG TERM GOALS: Target date: 04/11/2022  Pt will increase FOTO to at least 57 in order to demonstrate significant improvement in function at home and work related to back pain  Baseline: 02/14/22: To be completed at next visit; 02/22/22: 45; 03/29/22: Deferred; 05/23/22: 70 Goal status: ACHIEVED  2.  Pt will decrease worst back pain by at least 2 points on the NPRS in order to demonstrate clinically significant reduction in back pain. Baseline: 02/14/22: To be completed at next visit; 03/29/22: 10/10; 05/23/22: 8/10; Goal status: ACHIEVED  3.  Pt will report at least 50% improvement in her back symptoms in order to demonstrate clinically significant reduction in disability related to back pain;        Baseline: 03/29/22: 80% improvement since starting with therapy; Goal status: ACHIEVED   PLAN: PT FREQUENCY: 1x/week  PT DURATION: 8 weeks  PLANNED INTERVENTIONS: Therapeutic exercises, Therapeutic activity, Neuromuscular  re-education, Balance training, Gait training, Patient/Family education, Joint manipulation, Joint mobilization, Vestibular training, Canalith repositioning, Aquatic Therapy, Dry Needling, Electrical stimulation, Spinal manipulation, Spinal mobilization, Cryotherapy, Moist heat, Traction, Ultrasound, Ionotophoresis '4mg'$ /ml Dexamethasone, and Manual therapy  PLAN FOR NEXT SESSION: Add HEP for LE strengthening, continue manual techniques for R sided back pain and include LE strengthening and gait training,    Phillips Grout PT, DPT, GCS  Physical Therapist- [facility]  07/10/2022, 1:08 PM

## 2022-07-10 ENCOUNTER — Ambulatory Visit: Payer: Medicare Other

## 2022-07-10 DIAGNOSIS — M5459 Other low back pain: Secondary | ICD-10-CM

## 2022-07-10 DIAGNOSIS — M6281 Muscle weakness (generalized): Secondary | ICD-10-CM

## 2022-07-10 DIAGNOSIS — M25511 Pain in right shoulder: Secondary | ICD-10-CM | POA: Diagnosis not present

## 2022-07-10 DIAGNOSIS — G8929 Other chronic pain: Secondary | ICD-10-CM | POA: Diagnosis not present

## 2022-07-11 NOTE — Therapy (Signed)
OUTPATIENT PHYSICAL THERAPY SHOULDER TREATMENT/RECERTIFICATION  Patient Name: Sara [name] MRN: [id number] DOB:[birth year], 71 y.o., female Today's Date: 07/17/2022   PT End of Session - 07/17/22 1100     Visit Number 25    Number of Visits 33    Date for PT Re-Evaluation 07/18/22    Authorization Type eval: 6/65/99, recert: 35/7/01    PT Start Time 1101    PT Stop Time 1145    PT Time Calculation (min) 44 min    Activity Tolerance Patient tolerated treatment well    Behavior During Therapy [facility] for tasks assessed/performed             Past Medical History:  Diagnosis Date   Arthritis    right shoulder   Depression    Dyspnea    GERD (gastroesophageal reflux disease)    History of blood clots    Hyperlipidemia    Hypertension    Mild mitral regurgitation    Osteoporosis    Peripheral vascular disease ([place])    Stomach ulcer    Vascular disease    Sees Dr. Delana Meyer   Vertigo    Last episode approx Aug 2015   Wears dentures    full upper   Past Surgical History:  Procedure Laterality Date   ADENOIDECTOMY     AMPUTATION Right 08/11/2020   Procedure: AMPUTATION ABOVE KNEE;  Surgeon: [name], MD;  Location: [facility];  Service: Vascular;  Laterality: Right;   ARTERY BIOPSY Right 07/14/2019   Procedure: BIOPSY TEMPORAL ARTERY;  Surgeon: [name], MD;  Location: [facility];  Service: Vascular;  Laterality: Right;   BREAST CYST ASPIRATION Left    CARDIAC CATHETERIZATION  02/15/2017   [facility]   COLONOSCOPY WITH PROPOFOL N/A 10/22/2017   Procedure: COLONOSCOPY WITH PROPOFOL;  Surgeon: Lucilla Lame, MD;  Location: [place];  Service: Endoscopy;  Laterality: N/A;  specimens not taken--pt on Plavix will be brought back in after 7 days off med   COLONOSCOPY WITH PROPOFOL N/A 11/05/2017   Procedure: COLONOSCOPY WITH PROPOFOL;  Surgeon: Lucilla Lame, MD;  Location: [place];  Service: Endoscopy;  Laterality: N/A;    ESOPHAGOGASTRODUODENOSCOPY N/A 12/21/2014   Procedure: ESOPHAGOGASTRODUODENOSCOPY (EGD);  Surgeon: Lucilla Lame, MD;  Location: [place];  Service: Gastroenterology;  Laterality: N/A;   ESOPHAGOGASTRODUODENOSCOPY (EGD) WITH PROPOFOL N/A 02/08/2016   Procedure: ESOPHAGOGASTRODUODENOSCOPY (EGD) WITH PROPOFOL;  Surgeon: Lucilla Lame, MD;  Location: [facility];  Service: Endoscopy;  Laterality: N/A;   FASCIOTOMY Right 05/30/2019   Procedure: FASCIOTOMY;  Surgeon: [name], MD;  Location: [facility];  Service: Vascular;  Laterality: Right;   FASCIOTOMY CLOSURE Right 06/04/2019   Procedure: FASCIOTOMY CLOSURE;  Surgeon: [name], MD;  Location: [facility];  Service: Vascular;  Laterality: Right;   HEMORROIDECTOMY  2014   LOWER EXTREMITY ANGIOGRAPHY Right 05/30/2019   Procedure: LOWER EXTREMITY ANGIOGRAPHY;  Surgeon: [name], MD;  Location: [place] [facility];  Service: Cardiovascular;  Laterality: Right;   LOWER EXTREMITY ANGIOGRAPHY Right 07/02/2020   Procedure: LOWER EXTREMITY ANGIOGRAPHY;  Surgeon: [name], MD;  Location: [place] [facility];  Service: Cardiovascular;  Laterality: Right;   PERIPHERAL VASCULAR CATHETERIZATION N/A 02/09/2016   Procedure: Abdominal Aortogram w/Lower Extremity;  Surgeon: [name], MD;  Location: [place] [facility];  Service: Cardiovascular;  Laterality: N/A;   PERIPHERAL VASCULAR CATHETERIZATION Right 02/10/2016   Procedure: Lower Extremity Angiography;  Surgeon: [name], MD;  Location: [place]  [facility];  Service: Cardiovascular;  Laterality: Right;   POLYPECTOMY  11/05/2017   Procedure: POLYPECTOMY;  Surgeon: Lucilla Lame, MD;  Location: [place];  Service: Endoscopy;;   SHOULDER ARTHROSCOPY WITH ROTATOR CUFF REPAIR AND SUBACROMIAL DECOMPRESSION Right 02/27/2020   Procedure: RIGHT SHOULDER ARTHROSCOPY SUBACROMIAL DECOMPRESSION, DISTAL CLAVICLE EXCISION AND MINI-OPEN ROTATOR CUFF REPAIR;   Surgeon: Thornton Park, MD;  Location: [facility];  Service: Orthopedics;  Laterality: Right;   TONSILLECTOMY     VASCULAR SURGERY  8325,[id number]   Fem-Pop Bypass   Patient Active Problem List   Diagnosis Date Noted   Thoracic facet joint syndrome 06/08/2022   Bone lesion 06/08/2022   IgA monoclonal gammopathy 05/18/2022   Degeneration of lumbar intervertebral disc 01/04/2022   Shoulder pain 01/04/2022   Pain in joint of right shoulder 01/04/2022   Spasm of back muscles 01/04/2022   Thoracic back pain 01/04/2022   History of left above knee amputation [facility]) (Dec 2021) 12/27/2021   Rotator cuff arthropathy of right shoulder 12/27/2021   Hx of rotator cuff surgery 12/27/2021   Chronic pain syndrome 12/27/2021   Adhesive capsulitis of right shoulder 12/05/2021   Phantom pain ([place]) 11/10/2021   Muscle spasm 09/09/2020   S/P AKA (above knee amputation), right ([place]) 09/08/2020   Ischemia of extremity 07/02/2020   Chronic pain in right shoulder 11/25/2019   Encounter for long-term (current) use of high-risk medication 07/16/2019   GCA (giant cell arteritis) ([place]) 07/16/2019   Temporal arteritis ([place]) 07/13/2019   Postoperative wound infection 06/23/2019   Ischemic leg 05/31/2019   Atherosclerosis of native arteries of extremity with intermittent claudication ([place]) 05/30/2019   Depression 05/29/2019   Eczema of lower extremity 03/04/2018   Chronic venous insufficiency 03/02/2018   Personal history of colonic polyps    Family history of colonic polyps    Benign neoplasm of ascending colon    Mitral valve insufficiency 02/08/2017   Dyspnea on exertion 02/07/2017   Non-rheumatic mitral regurgitation 02/07/2017   Precordial pain 02/07/2017   CAD (coronary artery disease) 12/21/2016   Essential hypertension 12/21/2016   Compression fracture of lumbar spine, non-traumatic ([place]) 04/26/2016   Compression fracture of L3 lumbar vertebra 04/20/2016   PVD (peripheral vascular disease) ([place])  02/24/2016   Family history of premature CAD 02/24/2016   Gastritis    Other specified diseases of esophagus    Hiatal hernia    Gastritis and gastroduodenitis    Ischemia of lower extremity    Arterial occlusion ([place])    Atherosclerosis of aorta ([place])    History of smoking    Hyperlipidemia    Pain in the chest    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 02/05/2016   Carotid artery stenosis 01/04/2016   Vertigo, benign paroxysmal 12/28/2015   Clinical depression 09/06/2015   History of alcoholism ([place]) 09/06/2015   Angiopathy, 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[place]) 09/06/2015   Atherosclerosis of native arteries of extremity with rest pain ([employer]) 09/06/2015   Acute non-recurrent maxillary sinusitis 07/14/2015   Vaginal pruritus 05/26/2015   Chronic recurrent major depressive disorder ([place]) 03/09/2015   Osteoporosis, post-menopausal 03/09/2015   Peripheral blood vessel disorder ([place]) 03/09/2015   Acid reflux 03/09/2015   GERD (gastroesophageal reflux disease) 12/21/2014   Carotid artery narrowing 01/28/2014   Peripheral arterial occlusive disease ([place]) 06/08/2011   Occlusion and stenosis of unspecified carotid artery 06/08/2011   PAD (peripheral artery disease) ([place]) 06/08/2011   PCP: Danelle Berry, NP  REFERRING PROVIDER: Barrie Lyme MD  REFERRING DIAGNOSIS: S29.012A (ICD-10-CM) - Strain of muscle and tendon of back wall of thorax, initial encounter  THERAPY DIAG: Muscle weakness (generalized)  Other low back pain  Chronic right shoulder pain  RATIONALE FOR EVALUATION AND TREATMENT: Rehabilitation  ONSET DATE: 08/11/20 (approximate)  FROM INITIAL EVALUATION  SUBJECTIVE:                                                                                                                                                                                         Chief Complaint: R thoracic/rib pain  Pertinent History Pt reports she she started experiencing R shoulder  pain which started shortly after her RLE amputation (08/11/20). She attributes the pain to having to self-propel a wheelchair for an extended period of time. She has seen Dr. Mack Guise at [facility] in the past and underwent a R shoulder mini-open rotator cuff repair, DCE, and SAD on 02/27/20. Since the pain started she has undergone R shoulder injections without improvement in her pain. When pt describes her pain she points to the area below her R shoulder blade around T10/T11. She denies weakness in the RUE or any pain in the area of the [facility] joint or shoulder pain. Denies N/T in back or RUE. Pain worsens with extended use of RUE especially when cleaning the counters in her apartment or sweeping the floor. She has a history of L1 and L3 compression fractures in 2017. She currently sees pain management for her R shoulder pain and takes oxycodone to manage the pain. She also struggles with chronic RLE phantom pain s/p amputation and takes gabapentin for this issue. She has a history of multiple vascular surgeries prior to her amputation and also has a history of carpal tunnel syndrome.  Pain:  Pain Intensity: Not rated today Pain location: R back/flank inferior to the shoulder blade Pain Quality: sharp  Radiating: No  Numbness/Tingling: No Focal Weakness: No Aggravating factors: Excessive use of RUE, mopping the floors, wiping countertops, no pain with driving, Relieving factors: heating pad, never tried lidocaine patch, oxycodone, gabapentin doesn't help with R flank/back pain; 24-hour pain behavior: varies depending on activity History of prior shoulder or neck/shoulder injury, pain, surgery, or therapy: Yes Falls: Has patient fallen in last 6 months? No Dominant hand: right Imaging: Yes, however pt denies any imaging of her thoracic or lumbar spine since the pain started  Prior level of function: Independent with community mobility with device, ambulates with RLE prosthesis and LUE single lofstrand  crutch Occupational demands: retired Hobbies: caring for her dogs Red flags (personal history of cancer, chills/fever, night sweats, nausea, vomiting, unrelenting pain): Negative  Precautions: None  Weight  Bearing Restrictions: No  Living Environment Lives with: lives alone Lives in: House/apartment  Patient Goals: Decrease the pain so she can complete her homemaking responsibilities  OBJECTIVE:   Patient Surveys  FOTO: 17, predicted improvement to 1 QuickDASH: Deferred  Cognition Patient is oriented to person, place, and time.  Recent memory is intact.  Remote memory is intact.  Attention span and concentration are intact.  Expressive speech is intact.  Patient's fund of knowledge is within normal limits for educational level.    Gross Musculoskeletal Assessment Tremor: None Bulk: Normal Tone: Normal  Gait Pt ambulates with RLE prosthesis and lofstrand in LUE, decreased self-selected gait speed;  Posture Increased thoracic kyphosis, forward head, and rounded shoulder in sitting and standing;  Cervical Screen AROM: WFL and painless with overpressure in all planes Spurlings A (ipsilateral lateral flexion/axial compression): R: Negative L: Negative Spurlings B (ipsilateral lateral flexion/contralateral rotation/axial compression): R: Negative L: Negative Repeated movement: Not examined Hoffman Sign (cervical cord compression): R: Not examined L: Not examined ULTT Median: R: Not examined L: Not examined ULTT Ulnar: R: Not examined L: Not examined ULTT Radial: R: Not examined L: Not examined  AROM Cervical AROM is painless in all directions and WFL although not full. No reproduction of pain with AROM R shoulder flexion and abduction which is also limited but WFL. No gross deficits noted. Pt denies pain with thoracic flexion, extension, rotation, and lateral flexion.   LE MMT:  MMT (out of 5) Right 07/17/2022 Left 07/17/2022  Cervical (isometric)  Flexion WNL   Extension WNL  Lateral Flexion WNL WNL  Rotation WNL WNL      Shoulder   Flexion 4+ 4+  Extension    Abduction 4+ 4+  External rotation (seated) 4+ 4+  Internal rotation (seated) 4+ 4+  Horizontal abduction    Horizontal adduction    Lower Trapezius    Rhomboids        Elbow  Flexion 5 5  Extension 5 5  Pronation    Supination        Wrist  Flexion 5 5  Extension 5 5  Radial deviation    Ulnar deviation        MCP  Flexion    Extension    Abduction 5 5  Adduction 5 5  (* = pain; Blank rows = not tested)  Sensation Grossly intact to light touch bilateral UE as determined by testing dermatomes C2-T2. Proprioception and hot/cold testing deferred on this date.  Reflexes Deferred  Palpation Pt is nontender to palpation around entire R shoulder girdle anterior, lateral, and posterior. No pain with palpation to R cervical paraspinals, upper trap, rhomboids, or mid trap. She has pain to palpation along spinous processes from T9-L3 as well as along paraspinals in this region on the right.  Repeated Movements Deferred  Passive Accessory Intervertebral Motion Pt reports reproduction of pain with CPA and bilateral UPA T9-L3. Unable to fully assess mobility secondary to pain however appear grossly hypomobile with notable thoracic kyphosis when prone.  SPECIAL TESTS Rotator Cuff  Drop Arm Test: Negative Painful Arc (Pain from 60 to 120 degrees scaption): Negative Infraspinatus Muscle Test: Negative  Subacromial Impingement Hawkins-Kennedy: Negative Neer (Block scapula, PROM flexion): Negative Painful Arc (Pain from 60 to 120 degrees scaption): Negative Empty Can: Negative External Rotation Resistance: Negative Horizontal Adduction: Not examined Scapular Assist: Not examined  Labral Tear Biceps Load II (120 elevation, full ER, 90 elbow flexion, full supination, resisted elbow flexion): Not examined Crank (160  scaption, axial load with IR/ER): Not examined Active  Compression Test: Not examined  Bicep Tendon Pathology Speed (shoulder flexion to 90, external rotation, full elbow extension, and forearm supination with resistance: Not examined Yergason's (resisted shoulder ER and supination/biceps tendon pathology): Not examined  Shoulder Instability Sulcus Sign: Not examined Anterior Apprehension: Not examined  Beighton scale: Deferred  Outcome Measures Quick DASH: Deferred FOTO: Pt completed shoulder FOTO but this appears to be more related to back pain. Will update at next therapy session.   TODAY'S TREATMENT   SUBJECTIVE: Pt reports that she is having tailbone pain today. She fell last Thursday when trying to get out of bed and landed on the floor. She now has pain in sitting. She has a donut pillow which helps considerably. She saw Dr. Holley Raring and had thoracic spinal injections. She has not had any more R shoulder/flank pain however pt reports she has not been doing her regular activity. She denies any resting pain upon arrival.    PAIN: Denies R shoulder/flank pain;   Ther-ex Interval history obtained; Supine SLR with manual resistance 2 x 15 BLE; Supine bolster knee bridges 2 x 15; R sidelying L hip abduction with manual resistance 2 x 15; R sidelying R hip adduction with manual resistance 2 x 15; L sidelying R hip abduction with manual resistance 2 x 15; L sidelying L hip adduction with manual resistance 2 x 15; L sidelying manually resisted L hip abduction 2 x 15; Supine PVC chest press with manual resistance 2 x 15; Supine PVC row with manual resistance 2 x 15;   Not performed: Supine RLE manually resisted long axis ER and IR 2 x 15 each; Supine R shoulder flexion from neutral to 90 with 2# dumbbell (DB) x 10, 3# DB x 10; Supine R shoulder serratus punch at 90 flexion with 2# DB x 10, 3# DB x 10; Supine R shoulder circles at 90 flexion with 2# DB x 10 each direction, 3# DB x 10 each direction; L sidelying R shoulder ER with 3#  DB 2 x 10; L sidleying R shoulder abduction with 3# DB 2 x 10; R sidelying L hip clams with manual resistance from therapist 2 x 10; R sidelying L hip reverse clams 2 x 10; Prone R shoulder extension with 3# dumbbell (DB) 2 x 10; Prone R low row with 3# DB 2 x 10; Prone R horizontal abduction with 3# DB 2 x 10; Prone R low trap flexion (Y) with 3# DB 2 x 10; Prone R shoulder ER with 3# DB 2 x 10; Prone L knee flexion with manual resistance 2 x 10 Prone alternating hip extension with manual resistance x 10 BLE; Seated dynamic hug with blue tband 2 x 10; Seated pallof press with green tband x 10 toward each side; Seated scapular retractions 2 x 10; Seated overhead shoulder press with 3# dumbbells x 10, x 5, discontinued second set due to increase in thoracic/R flank pain; Nautilus lat pull down 60# 3 x 10; Seated shoulder flexion with 3# dumbbells x 10; Seated shoulder scaption with 3# dumbbells 2 x 10;   PATIENT EDUCATION:  Education details: Pt educated throughout session about proper posture and technique with exercises. Improved exercise technique, movement at target joints, use of target muscles after min to mod verbal, visual, tactile cues. Person educated: Patient Education method: Explanation, verbal cues, tactile cues, and demonstration Education comprehension: verbalized understanding and returned demonstration;   HOME EXERCISE PROGRAM: None currently   ASSESSMENT:  CLINICAL IMPRESSION: Continued with back/hip/core/shoulder strengthening during session today. . Pt advised that if she is concerned she can contact PCP but there is little that can be done for this issue that pt hasn't already implemented. Frequency will be maintained at 1x/wk. She will benefit from PT services to address deficits in strength and pain in order to return to full function at home with less pain.    REHAB POTENTIAL: Fair    CLINICAL DECISION MAKING: Unstable/unpredictable  EVALUATION  COMPLEXITY: High   GOALS: Goals reviewed with patient? Yes  SHORT TERM GOALS: Target date:  03/14/2022  Pt will be independent with HEP to improve strength and decrease back pain to improve pain-free function at home. Baseline:  Goal status: ONGOING   LONG TERM GOALS: Target date: 04/11/2022  Pt will increase FOTO to at least 57 in order to demonstrate significant improvement in function at home and work related to back pain  Baseline: 02/14/22: To be completed at next visit; 02/22/22: 45; 03/29/22: Deferred; 05/23/22: 70; 07/17/22: Deferred; Goal status: ACHIEVED  2.  Pt will decrease worst back pain by at least 2 points on the NPRS in order to demonstrate clinically significant reduction in back pain. Baseline: 02/14/22: To be completed at next visit; 03/29/22: 10/10; 05/23/22: 8/10; 07/17/22: 7/10; Goal status: ACHIEVED  3.  Pt will report at least 50% improvement in her back symptoms in order to demonstrate clinically significant reduction in disability related to back pain;        Baseline: 03/29/22: 80% improvement since starting with therapy; 07/17/22: 85% improved; Goal status: ACHIEVED   PLAN: PT FREQUENCY: 1x/week  PT DURATION: 8 weeks  PLANNED INTERVENTIONS: Therapeutic exercises, Therapeutic activity, Neuromuscular re-education, Balance training, Gait training, Patient/Family education, Joint manipulation, Joint mobilization, Vestibular training, Canalith repositioning, Aquatic Therapy, Dry Needling, Electrical stimulation, Spinal manipulation, Spinal mobilization, Cryotherapy, Moist heat, Traction, Ultrasound, Ionotophoresis '4mg'$ /ml Dexamethasone, and Manual therapy  PLAN FOR NEXT SESSION: Deferred  Phillips Grout PT, DPT, GCS  Physical Therapist- [facility]  07/17/2022, 11:01 AM

## 2022-07-12 DIAGNOSIS — M899 Disorder of bone, unspecified: Secondary | ICD-10-CM | POA: Diagnosis not present

## 2022-07-17 ENCOUNTER — Ambulatory Visit: Payer: Medicare Other

## 2022-07-17 ENCOUNTER — Telehealth: Payer: Medicare Other | Admitting: Student in an Organized Health Care Education/Training Program

## 2022-07-17 DIAGNOSIS — M25511 Pain in right shoulder: Secondary | ICD-10-CM | POA: Diagnosis not present

## 2022-07-17 DIAGNOSIS — G8929 Other chronic pain: Secondary | ICD-10-CM | POA: Diagnosis not present

## 2022-07-17 DIAGNOSIS — M6281 Muscle weakness (generalized): Secondary | ICD-10-CM | POA: Diagnosis not present

## 2022-07-17 DIAGNOSIS — M5459 Other low back pain: Secondary | ICD-10-CM | POA: Diagnosis not present

## 2022-07-20 DIAGNOSIS — I10 Essential (primary) hypertension: Secondary | ICD-10-CM | POA: Diagnosis not present

## 2022-07-20 DIAGNOSIS — I2510 Atherosclerotic heart disease of native coronary artery without angina pectoris: Secondary | ICD-10-CM | POA: Diagnosis not present

## 2022-07-20 DIAGNOSIS — E785 Hyperlipidemia, unspecified: Secondary | ICD-10-CM | POA: Diagnosis not present

## 2022-07-28 DIAGNOSIS — D472 Monoclonal gammopathy: Secondary | ICD-10-CM | POA: Diagnosis not present

## 2022-07-28 DIAGNOSIS — M899 Disorder of bone, unspecified: Secondary | ICD-10-CM | POA: Diagnosis not present

## 2022-08-03 ENCOUNTER — Other Ambulatory Visit (INDEPENDENT_AMBULATORY_CARE_PROVIDER_SITE_OTHER): Payer: Self-pay | Admitting: Vascular Surgery

## 2022-08-03 DIAGNOSIS — I6523 Occlusion and stenosis of bilateral carotid arteries: Secondary | ICD-10-CM

## 2022-08-06 NOTE — Progress Notes (Unsigned)
MRN : [id number]  Sara [name] is a 71 y.o. ([birth year]) female who presents with chief complaint of check circulation.  History of Present Illness:   The patient returns to the office for followup and review of the noninvasive studies regarding carotid stenosis and lower extremity occlusive disease.  The carotid stenosis followed by ultrasound.   The patient denies interval amaurosis fugax. There is no recent history of TIA symptoms or focal motor deficits. There is no prior documented CVA.  The patient is taking enteric-coated aspirin 81 mg daily.  There is no history of migraine headaches. There is no history of seizures.  There have been no interval changes in lower extremity symptoms. No interval shortening of the patient's claudication distance or development of rest pain symptoms. No new ulcers or wounds have occurred since the last visit.   The patient has complaints of phantom pains involving the right lower extremity and the sensation that he she is still having excruciating pain in her right foot.  She actually feels that the discomfort/pain is better.  She saw Dr Holley Raring earlier today and he adjusted her gabapentin.  At the present time she is now taking 800 mg total of gabapentin tid.  She is also taking Cymbalta.    There have been no significant changes to the patient's overall health care.   There is no history of DVT, PE or superficial thrombophlebitis. The patient denies recent episodes of angina or shortness of breath.    ABI Rt=AKA and Lt=1.20  (previous ABI's Rt=AKA and Lt=1.16)  Carotid Duplex done today shows 1-39% bilateral ICA stenosis.  No change compared to last study.  No outpatient medications have been marked as taking for the 08/07/22 encounter (Appointment) with Delana Meyer, Dolores Lory, MD.    Past Medical History:  Diagnosis Date   Arthritis    right shoulder   Depression    Dyspnea    GERD (gastroesophageal reflux disease)     History of blood clots    Hyperlipidemia    Hypertension    Mild mitral regurgitation    Osteoporosis    Peripheral vascular disease ([place])    Stomach ulcer    Vascular disease    Sees Dr. Delana Meyer   Vertigo    Last episode approx Aug 2015   Wears dentures    full upper    Past Surgical History:  Procedure Laterality Date   ADENOIDECTOMY     AMPUTATION Right 08/11/2020   Procedure: AMPUTATION ABOVE KNEE;  Surgeon: [name], MD;  Location: [facility];  Service: Vascular;  Laterality: Right;   ARTERY BIOPSY Right 07/14/2019   Procedure: BIOPSY TEMPORAL ARTERY;  Surgeon: [name], MD;  Location: [facility];  Service: Vascular;  Laterality: Right;   BREAST CYST ASPIRATION Left    CARDIAC CATHETERIZATION  02/15/2017   [facility]   COLONOSCOPY WITH PROPOFOL N/A 10/22/2017   Procedure: COLONOSCOPY WITH PROPOFOL;  Surgeon: Lucilla Lame, MD;  Location: [place];  Service: Endoscopy;  Laterality: N/A;  specimens not taken--pt on Plavix will be brought back in after 7 days off med   COLONOSCOPY WITH PROPOFOL N/A 11/05/2017   Procedure: COLONOSCOPY WITH PROPOFOL;  Surgeon: Lucilla Lame, MD;  Location: [place];  Service: Endoscopy;  Laterality: N/A;   ESOPHAGOGASTRODUODENOSCOPY N/A 12/21/2014   Procedure: ESOPHAGOGASTRODUODENOSCOPY (EGD);  Surgeon: Lucilla Lame, MD;  Location: [place];  Service: Gastroenterology;  Laterality: N/A;   ESOPHAGOGASTRODUODENOSCOPY (EGD) WITH PROPOFOL N/A 02/08/2016   Procedure: ESOPHAGOGASTRODUODENOSCOPY (EGD) WITH PROPOFOL;  Surgeon: Lucilla Lame, MD;  Location: [facility];  Service: Endoscopy;  Laterality: N/A;   FASCIOTOMY Right 05/30/2019   Procedure: FASCIOTOMY;  Surgeon: [name], MD;  Location: [facility];  Service: Vascular;  Laterality: Right;   FASCIOTOMY CLOSURE Right 06/04/2019   Procedure: FASCIOTOMY CLOSURE;  Surgeon: [name], MD;  Location: [facility];  Service: Vascular;  Laterality: Right;    HEMORROIDECTOMY  2014   LOWER EXTREMITY ANGIOGRAPHY Right 05/30/2019   Procedure: LOWER EXTREMITY ANGIOGRAPHY;  Surgeon: [name], MD;  Location: [place] [facility];  Service: Cardiovascular;  Laterality: Right;   LOWER EXTREMITY ANGIOGRAPHY Right 07/02/2020   Procedure: LOWER EXTREMITY ANGIOGRAPHY;  Surgeon: [name], MD;  Location: [place] [facility];  Service: Cardiovascular;  Laterality: Right;   PERIPHERAL VASCULAR CATHETERIZATION N/A 02/09/2016   Procedure: Abdominal Aortogram w/Lower Extremity;  Surgeon: [name], MD;  Location: [place] [facility];  Service: Cardiovascular;  Laterality: N/A;   PERIPHERAL VASCULAR CATHETERIZATION Right 02/10/2016   Procedure: Lower Extremity Angiography;  Surgeon: [name], MD;  Location: [place] [facility];  Service: Cardiovascular;  Laterality: Right;   POLYPECTOMY  11/05/2017   Procedure: POLYPECTOMY;  Surgeon: Lucilla Lame, MD;  Location: [place];  Service: Endoscopy;;   SHOULDER ARTHROSCOPY WITH ROTATOR CUFF REPAIR AND SUBACROMIAL DECOMPRESSION Right 02/27/2020   Procedure: RIGHT SHOULDER ARTHROSCOPY SUBACROMIAL DECOMPRESSION, DISTAL CLAVICLE EXCISION AND MINI-OPEN ROTATOR CUFF REPAIR;  Surgeon: Thornton Park, MD;  Location: [facility];  Service: Orthopedics;  Laterality: Right;   TONSILLECTOMY     VASCULAR SURGERY  [id number]   Fem-Pop Bypass    Social History Social History   Tobacco Use   Smoking status: Former    Packs/day: 2.00    Years: 25.00    Total pack years: 50.00    Types: Cigarettes    Quit date: 08/22/1991    Years since quitting: 30.9   Smokeless tobacco: Never  Vaping Use   Vaping Use: Never used  Substance Use Topics   Alcohol use: No    Alcohol/week: 0.0 standard drinks of alcohol   Drug use: Never    Family History Family History  Problem Relation Age of Onset   CVA Mother    Heart attack Mother    Cancer Father        colon cancer   Colon cancer Father     Heart disease Maternal Uncle    Breast cancer Neg Hx     Allergies  Allergen Reactions   Hydrocodone-Acetaminophen     Other reaction(s): Flushing Other reaction(s): Flushing   Amoxicillin Other (See Comments)    Yeast infection   Metoprolol Tartrate Rash   Oxycodone Itching   Vicodin [Hydrocodone-Acetaminophen] Hives and Rash    Flushing     REVIEW OF SYSTEMS (Negative unless checked)  Constitutional: '[]'$ Weight loss  '[]'$ Fever  '[]'$ Chills Cardiac: '[]'$ Chest pain   '[]'$ Chest pressure   '[]'$ Palpitations   '[]'$ Shortness of breath when laying flat   '[]'$ Shortness of breath with exertion. Vascular:  '[x]'$ Pain in legs with walking   '[]'$ Pain in legs at rest  '[]'$ History of DVT   '[]'$ Phlebitis   '[]'$ Swelling in legs   '[]'$ Varicose veins   '[]'$ Non-healing ulcers Pulmonary:   '[]'$ Uses home oxygen   '[]'$ Productive cough   '[]'$ Hemoptysis   '[]'$ Wheeze  '[]'$ COPD   '[]'$ Asthma Neurologic:  '[]'$ Dizziness   '[]'$ Seizures   '[]'$ History  of stroke   '[]'$ History of TIA  '[]'$ Aphasia   '[]'$ Vissual changes   '[]'$ Weakness or numbness in arm   '[x]'$ Weakness or numbness in leg Musculoskeletal:   '[]'$ Joint swelling   '[]'$ Joint pain   '[x]'$ Low back pain Hematologic:  '[]'$ Easy bruising  '[]'$ Easy bleeding   '[]'$ Hypercoagulable state   '[]'$ Anemic Gastrointestinal:  '[]'$ Diarrhea   '[]'$ Vomiting  '[x]'$ Gastroesophageal reflux/heartburn   '[]'$ Difficulty swallowing. Genitourinary:  '[]'$ Chronic kidney disease   '[]'$ Difficult urination  '[]'$ Frequent urination   '[]'$ Blood in urine Skin:  '[]'$ Rashes   '[]'$ Ulcers  Psychological:  '[]'$ History of anxiety   '[]'$  History of major depression.  Physical Examination  There were no vitals filed for this visit. There is no height or weight on file to calculate BMI. Gen: WD/WN, NAD Head: [place]/AT, No temporalis wasting.  Ear/Nose/Throat: Hearing grossly intact, nares w/o erythema or drainage Eyes: PER, EOMI, sclera nonicteric.  Neck: Supple, no masses.  No bruit or JVD.  Pulmonary:  Good air movement, no audible wheezing, no use of accessory muscles.  Cardiac: RRR, normal  S1, S2, no Murmurs. Vascular:  mild trophic changes, no open wounds Vessel Right Left  Radial Palpable Palpable  PT Not Palpable Not Palpable  DP Not Palpable Not Palpable  Gastrointestinal: soft, non-distended. No guarding/no peritoneal signs.  Musculoskeletal: M/S 5/5 throughout.  No visible deformity.  Neurologic: CN 2-12 intact. Pain and light touch intact in extremities.  Symmetrical.  Speech is fluent. Motor exam as listed above. Psychiatric: Judgment intact, Mood & affect appropriate for pt's clinical situation. Dermatologic: No rashes or ulcers noted.  No changes consistent with cellulitis.   CBC Lab Results  Component Value Date   WBC 9.4 05/31/2022   HGB 15.6 (H) 05/31/2022   HCT 48.4 (H) 05/31/2022   MCV 96.4 05/31/2022   PLT 207 05/31/2022    BMET    Component Value Date/Time   NA 136 04/21/2022 1143   NA 139 09/21/2015 0916   K 4.5 04/21/2022 1143   CL 102 04/21/2022 1143   CO2 27 04/21/2022 1143   GLUCOSE 103 (H) 04/21/2022 1143   BUN 9 04/21/2022 1143   BUN 12 09/21/2015 0916   CREATININE 0.76 04/21/2022 1143   CREATININE 0.88 05/16/2016 1034   CALCIUM 8.7 (L) 04/21/2022 1143   GFRNONAA >60 04/21/2022 1143   GFRNONAA 66 05/01/2016 0846   GFRAA >60 02/18/2020 0949   GFRAA 76 05/01/2016 0846   CrCl cannot be calculated (Patient's most recent lab result is older than the maximum 21 days allowed.).  COAG Lab Results  Component Value Date   INR 1.0 08/09/2020   INR 1.3 (H) 02/18/2020   INR 1.0 07/11/2019    Radiology No results found.   Assessment/Plan 1. Bilateral carotid artery stenosis Recommend:  Given the patient's asymptomatic subcritical stenosis no further invasive testing or surgery at this time.  Duplex ultrasound shows 1-39% stenosis bilaterally.  Continue antiplatelet therapy as prescribed Continue management of CAD, HTN and Hyperlipidemia Healthy heart diet,  encouraged exercise at least 4 times per week Follow up in 24 months  with duplex ultrasound and physical exam   2. Atherosclerosis of native artery of left lower extremity with intermittent claudication (HCC)  Recommend:  The patient has evidence of atherosclerosis of the lower extremities with claudication.  The patient does not voice lifestyle limiting changes at this point in time.  Noninvasive studies do not suggest clinically significant change.  No invasive studies, angiography or surgery at this time The patient should continue walking and begin  a more formal exercise program.  The patient should continue antiplatelet therapy and aggressive treatment of the lipid abnormalities  No changes in the patient's medications at this time  Continued surveillance is indicated as atherosclerosis is likely to progress with time.    The patient will continue follow up with noninvasive studies as ordered in January 2024.  - VAS [place] ABI WITH/WO TBI; Future  3. S/P AKA (above knee amputation), right (HCC) I will order socks for her prosthesis otherwise doing well  4. Essential hypertension Continue antihypertensive medications as already ordered, these medications have been reviewed and there are no changes at this time.  5. Coronary artery disease involving native coronary artery of native heart with angina pectoris ([place]) Continue cardiac and antihypertensive medications as already ordered and reviewed, no changes at this time.  Continue statin as ordered and reviewed, no changes at this time  Nitrates PRN for chest pain  6. Other hyperlipidemia Continue statin as ordered and reviewed, no changes at this time    [name], MD  08/06/2022 2:01 PM

## 2022-08-07 ENCOUNTER — Encounter (INDEPENDENT_AMBULATORY_CARE_PROVIDER_SITE_OTHER): Payer: Self-pay | Admitting: Vascular Surgery

## 2022-08-07 ENCOUNTER — Ambulatory Visit (INDEPENDENT_AMBULATORY_CARE_PROVIDER_SITE_OTHER): Payer: Medicare Other | Admitting: Vascular Surgery

## 2022-08-07 ENCOUNTER — Ambulatory Visit (INDEPENDENT_AMBULATORY_CARE_PROVIDER_SITE_OTHER): Payer: Medicare Other

## 2022-08-07 VITALS — BP 146/69 | HR 80 | Resp 16

## 2022-08-07 DIAGNOSIS — E7849 Other hyperlipidemia: Secondary | ICD-10-CM | POA: Diagnosis not present

## 2022-08-07 DIAGNOSIS — I779 Disorder of arteries and arterioles, unspecified: Secondary | ICD-10-CM | POA: Diagnosis not present

## 2022-08-07 DIAGNOSIS — I10 Essential (primary) hypertension: Secondary | ICD-10-CM | POA: Diagnosis not present

## 2022-08-07 DIAGNOSIS — I25119 Atherosclerotic heart disease of native coronary artery with unspecified angina pectoris: Secondary | ICD-10-CM

## 2022-08-07 DIAGNOSIS — I70212 Atherosclerosis of native arteries of extremities with intermittent claudication, left leg: Secondary | ICD-10-CM | POA: Diagnosis not present

## 2022-08-07 DIAGNOSIS — I6523 Occlusion and stenosis of bilateral carotid arteries: Secondary | ICD-10-CM

## 2022-08-07 DIAGNOSIS — Z89611 Acquired absence of right leg above knee: Secondary | ICD-10-CM | POA: Diagnosis not present

## 2022-08-16 ENCOUNTER — Telehealth: Payer: Self-pay

## 2022-08-16 ENCOUNTER — Telehealth: Payer: Self-pay | Admitting: Student in an Organized Health Care Education/Training Program

## 2022-08-16 NOTE — Telephone Encounter (Signed)
PT was returning call back from nurse, pateint has an vv appt scheduled for tomorow

## 2022-08-16 NOTE — Telephone Encounter (Signed)
LM for patient to call for pre virtual visit questions.

## 2022-08-17 ENCOUNTER — Encounter: Payer: Self-pay | Admitting: Student in an Organized Health Care Education/Training Program

## 2022-08-17 ENCOUNTER — Ambulatory Visit
Payer: Medicare Other | Attending: Student in an Organized Health Care Education/Training Program | Admitting: Student in an Organized Health Care Education/Training Program

## 2022-08-17 DIAGNOSIS — S22000S Wedge compression fracture of unspecified thoracic vertebra, sequela: Secondary | ICD-10-CM

## 2022-08-17 DIAGNOSIS — G894 Chronic pain syndrome: Secondary | ICD-10-CM | POA: Diagnosis not present

## 2022-08-17 DIAGNOSIS — M47894 Other spondylosis, thoracic region: Secondary | ICD-10-CM | POA: Diagnosis not present

## 2022-08-17 NOTE — Progress Notes (Signed)
Patient: Sara [name]  Service Category: E/M  Provider: Gillis Santa, MD  DOB: [birth year]  DOS: 08/17/2022  Location: Office  MRN: [id number]  Setting: Ambulatory outpatient  Referring Provider: Danelle Berry, NP  Type: Established Patient  Specialty: Interventional Pain Management  PCP: Danelle Berry, NP  Location: Remote location  Delivery: TeleHealth     Virtual Encounter - Pain Management PROVIDER NOTE: Information contained herein reflects review and annotations entered in association with encounter. Interpretation of such information and data should be left to medically-trained personnel. Information provided to patient can be located elsewhere in the medical record under "Patient Instructions". Document created using STT-dictation technology, any transcriptional errors that may result from process are unintentional.    Contact & Pharmacy Preferred: [place]: [phone] (home) Mobile: [phone] (mobile) E-mail: [email or web address]  [id number]  - [address] [id number]: [phone]: [phone]  Pre-screening  Sara [name] offered "in-person" vs "virtual" encounter. She indicated preferring virtual for this encounter.   Reason COVID-19*  Social distancing based on CDC and AMA recommendations.   I contacted Sara Salkon 08/17/2022 via telephone.      I clearly identified myself as BGillis Santa MD. I verified that I was speaking with the correct person using two identifiers (Name: Sara [name] and date of birth: 2November 17, 1952.  Consent I sought verbal advanced consent from Sara Salkfor virtual visit interactions. I informed [name] possible security and privacy concerns, risks, and limitations associated with providing "not-in-person" medical evaluation and management services. I also informed Sara [name] of the availability of "in-person" appointments. Finally, I  informed her that there would be a charge for the virtual visit and that she could be  personally, fully or partially, financially responsible for it. Ms. JMoyeexpressed understanding and agreed to proceed.   Historic Elements   [name] a 71y.o. year old, female patient evaluated today after our last contact on 08/16/2022. [name] has a past medical history of Arthritis, Depression, Dyspnea, GERD (gastroesophageal reflux disease), History of blood clots, Hyperlipidemia, Hypertension, Mild mitral regurgitation, Osteoporosis, Peripheral vascular disease ([place], Stomach ulcer, Vascular disease, Vertigo, and Wears dentures. She also  has a past surgical history that includes Vascular surgery (([id number]; Hemorroidectomy (2014); Esophagogastroduodenoscopy (N/A, 12/21/2014); Esophagogastroduodenoscopy (egd) with propofol (N/A, 02/08/2016); Cardiac catheterization (N/A, 02/09/2016); Cardiac catheterization (Right, 02/10/2016); Cardiac catheterization (02/15/2017); Colonoscopy with propofol (N/A, 10/22/2017); Colonoscopy with propofol (N/A, 11/05/2017); polypectomy (11/05/2017); Tonsillectomy; Adenoidectomy; Lower Extremity Angiography (Right, 05/30/2019); Fasciotomy closure (Right, 06/04/2019); Artery Biopsy (Right, 07/14/2019); Fasciotomy (Right, 05/30/2019); Breast cyst aspiration (Left); Shoulder arthroscopy with rotator cuff repair and subacromial decompression (Right, 02/27/2020); Lower Extremity Angiography (Right, 07/02/2020); and Amputation (Right, 08/11/2020). [name] a current medication list which includes the following prescription(s): alendronate, atorvastatin, calcium carb-cholecalciferol, calcium carbonate, cholecalciferol, clonazepam, digoxin, duloxetine, gabapentin, losartan, omeprazole, actemra actpen, triamcinolone, zolpidem, and digoxin. She  reports that she quit smoking about 31 years ago. Her smoking use included cigarettes. She has a 50.00 pack-year smoking history. She has  never used smokeless tobacco. She reports that she does not drink alcohol and does not use drugs. [name] allergic to hydrocodone-acetaminophen, amoxicillin, metoprolol tartrate, oxycodone, and vicodin [hydrocodone-acetaminophen].  Estimated body mass index is 21.11 kg/m as calculated from the following:   Height as of 07/05/22: _1  (1.626 m).   Weight as of 07/05/22: 123 lb (55.8 kg).  HPI  Today,  she is being contacted for a post-procedure assessment.   Post-procedure evaluation   Type: Thoracic facet medial branch block #1  Laterality: Right (-RT)  Level: T5 and T6 Medial Branch Level(s)  Imaging: Fluoroscopy-guided         Anesthesia: Local anesthesia (1-2% Lidocaine) Anxiolysis: IV Versed  DOS: 07/05/2022  Performed by: Gillis Santa, MD  Purpose: Diagnostic/Therapeutic Indications: Thoracic back pain severe enough to impact quality of life or function. Rationale (medical necessity): procedure needed and proper for the diagnosis and/or treatment of Sara [name]'s medical symptoms and needs. 1. Thoracic facet joint syndrome   2. Rotator cuff arthropathy of right shoulder   3. Chronic pain syndrome    NAS-11 Pain score:   Pre-procedure: 0-No pain/10   Post-procedure: 0-No pain/10      Effectiveness:  Initial hour after procedure: 100 %. Subsequent 4-6 hours post-procedure: 100 %  Analgesia past initial 6 hours: 100 % (no pain x 2 -3 days, when she began using her right arm again for household chores the pain returned.)  Ongoing improvement:  Analgesic:  <20% Function: Back to baseline ROM: Back to baseline   Laboratory Chemistry Profile   Renal Lab Results  Component Value Date   BUN 9 04/21/2022   CREATININE 0.76 04/21/2022   BCR 13 09/21/2015   GFRAA >60 02/18/2020   GFRNONAA >60 04/21/2022    Hepatic Lab Results  Component Value Date   AST 32 04/21/2022   ALT 36 04/21/2022   ALBUMIN 4.0 04/21/2022   ALKPHOS 57 04/21/2022    Electrolytes Lab  Results  Component Value Date   NA 136 04/21/2022   K 4.5 04/21/2022   CL 102 04/21/2022   CALCIUM 8.7 (L) 04/21/2022   MG 1.9 07/03/2020   PHOS 3.6 06/02/2019    Bone Lab Results  Component Value Date   VD25OH 45.8 09/21/2015    Inflammation (CRP: Acute Phase) (ESR: Chronic Phase) Lab Results  Component Value Date   ESRSEDRATE 58 (H) 07/03/2019   LATICACIDVEN 0.8 06/01/2019         Note: Above Lab results reviewed.  Imaging  VAS US CAROTID Carotid Arterial Duplex Study  Patient Name:  Sara [name]  Date of Exam:   08/07/2022 Medical Rec #: [id number]             Accession #:    [id number] Date of Birth: [birth year]              Patient Gender: F Patient Age:   16 years Exam Location:  [facility] Procedure:      VAS US CAROTID Referring Phys: [name]  --------------------------------------------------------------------------------   Indications:  Carotid artery disease. Risk Factors: Hypertension, coronary artery disease, PAD.  Performing Technologist: Delorise Shiner RVT    Examination Guidelines: A complete evaluation includes B-mode imaging, spectral Doppler, color Doppler, and power Doppler as needed of all accessible portions of each vessel. Bilateral testing is considered an integral part of a complete examination. Limited examinations for reoccurring indications may be performed as noted.    Right Carotid Findings: +----------+--------+--------+--------+------------------+--------+           PSV cm/sEDV cm/sStenosisPlaque DescriptionComments +----------+--------+--------+--------+------------------+--------+ CCA Prox  106     15                                         +----------+--------+--------+--------+------------------+--------+ CCA Mid   71  21                                         +----------+--------+--------+--------+------------------+--------+ CCA Distal59      15                                          +----------+--------+--------+--------+------------------+--------+ ICA Prox  122     32      1-39%   diffuse                    +----------+--------+--------+--------+------------------+--------+ ICA Mid   74      22                                         +----------+--------+--------+--------+------------------+--------+ ICA Distal74      20                                         +----------+--------+--------+--------+------------------+--------+ ECA       89      18                                         +----------+--------+--------+--------+------------------+--------+  +----------+--------+-------+----------------+-------------------+           PSV cm/sEDV cmsDescribe        Arm Pressure (mmHG) +----------+--------+-------+----------------+-------------------+ [id number]            Multiphasic, WNL                    +----------+--------+-------+----------------+-------------------+  +---------+--------+--+--------+--+---------+ VertebralPSV cm/s42EDV cm/s12Antegrade +---------+--------+--+--------+--+---------+     Left Carotid Findings: +----------+--------+--------+--------+--------------------------+--------+           PSV cm/sEDV cm/sStenosisPlaque Description        Comments +----------+--------+--------+--------+--------------------------+--------+ CCA Prox  83      15                                                 +----------+--------+--------+--------+--------------------------+--------+ CCA Mid   71      22                                                 +----------+--------+--------+--------+--------------------------+--------+ CCA Distal62      22                                                 +----------+--------+--------+--------+--------------------------+--------+ ICA Prox  42      12      1-39%   heterogenous and irregular          +----------+--------+--------+--------+--------------------------+--------+ ICA Mid   88  28                                                 +----------+--------+--------+--------+--------------------------+--------+ ICA Distal113     31                                                 +----------+--------+--------+--------+--------------------------+--------+ ECA       73      16              irregular                          +----------+--------+--------+--------+--------------------------+--------+  +----------+--------+--------+----------------+-------------------+           PSV cm/sEDV cm/sDescribe        Arm Pressure (mmHG) +----------+--------+--------+----------------+-------------------+ [id number]             Multiphasic, WNL                    +----------+--------+--------+----------------+-------------------+  +---------+--------+--+--------+--+---------+ VertebralPSV cm/s52EDV cm/s12Antegrade +---------+--------+--+--------+--+---------+        Summary: Right Carotid: Velocities in the right ICA are consistent with a 1-39% stenosis.  Left Carotid: Velocities in the left ICA are consistent with a 1-39% stenosis.  Vertebrals:  Bilateral vertebral arteries demonstrate antegrade flow. Subclavians: Normal flow hemodynamics were seen in bilateral subclavian              arteries.  *See table(s) above for measurements and observations.    Electronically signed by [name] MD on 08/07/2022 at 3:28:23 PM.      Final    Assessment  The primary encounter diagnosis was Thoracic facet joint syndrome. Diagnoses of Compression fracture of thoracic vertebra, unspecified thoracic vertebral level, sequela and Chronic pain syndrome were also pertinent to this visit.  Plan of Care   1. Thoracic facet joint syndrome - THORACIC FACET BLOCK; Future  2. Compression fracture of thoracic vertebra, unspecified thoracic  vertebral level, sequela - THORACIC FACET BLOCK; Future  3. Chronic pain syndrome - THORACIC FACET BLOCK; Future  Repeat right diagnostic thoracic facet medial branch nerve block at T5-T6.  Add additional volume.  Consider RFA thereafter.  Orders:  Orders Placed This Encounter  Procedures   THORACIC FACET BLOCK    Standing Status:   Future    Standing Expiration Date:   11/16/2022    Scheduling Instructions:     Thoracic Medial Branch Block     Side: RIGHT T5,6      Sedation: IV Versed     Timeframe: ASAA    Order Specific Question:   Where will this procedure be performed?    Answer:   [facility]   Follow-up plan:   Return in about 11 days (around 08/28/2022) for Right T5-T6 MBNB #2, in clinic IV Versed.      1.  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  Discussed treatment options which include titration of gabapentin to a more therapeutic dose.  Can also consider trial of Lyrica in future.  2.  Consider lidocaine infusion for right lower extremity phantom limb pain.  QTc checked from EKG in November 2022 and is appropriate.  Will need updated EKG if  we are considering lidocaine infusion.  3.  Consider spinal cord stimulation for phantom limb pain.  Informed patient that there is mixed evidence on this but this could be a treatment option.  4.  Right rotator cuff arthropathy and dysfunction: Consider right suprascapular nerve block and possible RFA or suprascapular peripheral nerve stimulation.      Recent Visits Date Type Provider Dept  07/05/22 Procedure visit Gillis Santa, MD [facility]  06/26/22 Procedure visit Gillis Santa, MD [facility]  06/08/22 Office Visit Gillis Santa, MD [facility]  Showing recent visits within past 90 days and meeting all other requirements Today's Visits Date Type Provider Dept  08/17/22 Office Visit Gillis Santa, MD [facility]  Showing today's visits and meeting  all other requirements Future Appointments No visits were found meeting these conditions. Showing future appointments within next 90 days and meeting all other requirements  I discussed the assessment and treatment plan with the patient. The patient was provided an opportunity to ask questions and all were answered. The patient agreed with the plan and demonstrated an understanding of the instructions.  Patient advised to call back or seek an in-person evaluation if the symptoms or condition worsens.  Duration of encounter: 63mnutes.  Note by: BGillis Santa MD Date: 08/17/2022; Time: 2:07 PM

## 2022-08-22 ENCOUNTER — Telehealth (INDEPENDENT_AMBULATORY_CARE_PROVIDER_SITE_OTHER): Payer: Self-pay

## 2022-08-23 NOTE — Telephone Encounter (Signed)
Done

## 2022-08-23 NOTE — Telephone Encounter (Signed)
sent 

## 2022-08-28 NOTE — Progress Notes (Signed)
MRN : [id number]  Sara [name] is a 72 y.o. (1951/02/18) female who presents with chief complaint of check circulation.  History of Present Illness:   The patient returns to the office for followup and review of the noninvasive studies regarding lower extremity occlusive disease.   The carotid stenosis followed by ultrasound.    The patient denies interval amaurosis fugax. There is no recent history of TIA symptoms or focal motor deficits. There is no prior documented CVA.   The patient is taking enteric-coated aspirin 81 mg daily.   There is no history of migraine headaches. There is no history of seizures.   There have been no interval changes in lower extremity symptoms. No interval shortening of the patient's claudication distance or development of rest pain symptoms. No new ulcers or wounds have occurred since the last visit.    There have been no significant changes to the patient's overall health care.   There is no history of DVT, PE or superficial thrombophlebitis. The patient denies recent episodes of angina or shortness of breath.    ABI Rt=AKA and Lt=1.13  (previous ABI's Rt=AKA and Lt=1.20)   No outpatient medications have been marked as taking for the 08/31/22 encounter (Appointment) with Delana Meyer, Dolores Lory, MD.    Past Medical History:  Diagnosis Date   Arthritis    right shoulder   Depression    Dyspnea    GERD (gastroesophageal reflux disease)    History of blood clots    Hyperlipidemia    Hypertension    Mild mitral regurgitation    Osteoporosis    Peripheral vascular disease ([place])    Stomach ulcer    Vascular disease    Sees Dr. Delana Meyer   Vertigo    Last episode approx Aug 2015   Wears dentures    full upper    Past Surgical History:  Procedure Laterality Date   ADENOIDECTOMY     AMPUTATION Right 08/11/2020   Procedure: AMPUTATION ABOVE KNEE;  Surgeon: [name], MD;  Location: [facility];  Service: Vascular;  Laterality:  Right;   ARTERY BIOPSY Right 07/14/2019   Procedure: BIOPSY TEMPORAL ARTERY;  Surgeon: [name], MD;  Location: [facility];  Service: Vascular;  Laterality: Right;   BREAST CYST ASPIRATION Left    CARDIAC CATHETERIZATION  02/15/2017   [facility]   COLONOSCOPY WITH PROPOFOL N/A 10/22/2017   Procedure: COLONOSCOPY WITH PROPOFOL;  Surgeon: Lucilla Lame, MD;  Location: [place];  Service: Endoscopy;  Laterality: N/A;  specimens not taken--pt on Plavix will be brought back in after 7 days off med   COLONOSCOPY WITH PROPOFOL N/A 11/05/2017   Procedure: COLONOSCOPY WITH PROPOFOL;  Surgeon: Lucilla Lame, MD;  Location: [place];  Service: Endoscopy;  Laterality: N/A;   ESOPHAGOGASTRODUODENOSCOPY N/A 12/21/2014   Procedure: ESOPHAGOGASTRODUODENOSCOPY (EGD);  Surgeon: Lucilla Lame, MD;  Location: [place];  Service: Gastroenterology;  Laterality: N/A;   ESOPHAGOGASTRODUODENOSCOPY (EGD) WITH PROPOFOL N/A 02/08/2016   Procedure: ESOPHAGOGASTRODUODENOSCOPY (EGD) WITH PROPOFOL;  Surgeon: Lucilla Lame, MD;  Location: [facility];  Service: Endoscopy;  Laterality: N/A;   FASCIOTOMY Right 05/30/2019   Procedure: FASCIOTOMY;  Surgeon: [name], MD;  Location: [facility];  Service: Vascular;  Laterality: Right;   FASCIOTOMY CLOSURE Right 06/04/2019   Procedure: FASCIOTOMY CLOSURE;  Surgeon: [name], MD;  Location: [facility];  Service: Vascular;  Laterality: Right;   HEMORROIDECTOMY  2014   LOWER EXTREMITY ANGIOGRAPHY Right 05/30/2019  Procedure: LOWER EXTREMITY ANGIOGRAPHY;  Surgeon: [name], MD;  Location: [place] [facility];  Service: Cardiovascular;  Laterality: Right;   LOWER EXTREMITY ANGIOGRAPHY Right 07/02/2020   Procedure: LOWER EXTREMITY ANGIOGRAPHY;  Surgeon: [name], MD;  Location: [place] [facility];  Service: Cardiovascular;  Laterality: Right;   PERIPHERAL VASCULAR CATHETERIZATION N/A 02/09/2016   Procedure: Abdominal Aortogram  w/Lower Extremity;  Surgeon: [name], MD;  Location: [place] [facility];  Service: Cardiovascular;  Laterality: N/A;   PERIPHERAL VASCULAR CATHETERIZATION Right 02/10/2016   Procedure: Lower Extremity Angiography;  Surgeon: [name], MD;  Location: [place] [facility];  Service: Cardiovascular;  Laterality: Right;   POLYPECTOMY  11/05/2017   Procedure: POLYPECTOMY;  Surgeon: Lucilla Lame, MD;  Location: [place];  Service: Endoscopy;;   SHOULDER ARTHROSCOPY WITH ROTATOR CUFF REPAIR AND SUBACROMIAL DECOMPRESSION Right 02/27/2020   Procedure: RIGHT SHOULDER ARTHROSCOPY SUBACROMIAL DECOMPRESSION, DISTAL CLAVICLE EXCISION AND MINI-OPEN ROTATOR CUFF REPAIR;  Surgeon: Thornton Park, MD;  Location: [facility];  Service: Orthopedics;  Laterality: Right;   TONSILLECTOMY     VASCULAR SURGERY  [id number]   Fem-Pop Bypass    Social History Social History   Tobacco Use   Smoking status: Former    Packs/day: 2.00    Years: 25.00    Total pack years: 50.00    Types: Cigarettes    Quit date: 08/22/1991    Years since quitting: 31.0   Smokeless tobacco: Never  Vaping Use   Vaping Use: Never used  Substance Use Topics   Alcohol use: No    Alcohol/week: 0.0 standard drinks of alcohol   Drug use: Never    Family History Family History  Problem Relation Age of Onset   CVA Mother    Heart attack Mother    Cancer Father        colon cancer   Colon cancer Father    Heart disease Maternal Uncle    Breast cancer Neg Hx     Allergies  Allergen Reactions   Hydrocodone-Acetaminophen     Other reaction(s): Flushing Other reaction(s): Flushing   Amoxicillin Other (See Comments)    Yeast infection   Metoprolol Tartrate Rash   Oxycodone Itching   Vicodin [Hydrocodone-Acetaminophen] Hives and Rash    Flushing     REVIEW OF SYSTEMS (Negative unless checked)  Constitutional: '[]'$ Weight loss  '[]'$ Fever  '[]'$ Chills Cardiac: '[]'$ Chest pain   '[]'$ Chest pressure   '[]'$ Palpitations    '[]'$ Shortness of breath when laying flat   '[]'$ Shortness of breath with exertion. Vascular:  '[x]'$ Pain in legs with walking   '[]'$ Pain in legs at rest  '[]'$ History of DVT   '[]'$ Phlebitis   '[]'$ Swelling in legs   '[]'$ Varicose veins   '[]'$ Non-healing ulcers Pulmonary:   '[]'$ Uses home oxygen   '[]'$ Productive cough   '[]'$ Hemoptysis   '[]'$ Wheeze  '[]'$ COPD   '[]'$ Asthma Neurologic:  '[]'$ Dizziness   '[]'$ Seizures   '[]'$ History of stroke   '[]'$ History of TIA  '[]'$ Aphasia   '[]'$ Vissual changes   '[]'$ Weakness or numbness in arm   '[x]'$ Weakness or numbness in leg Musculoskeletal:   '[]'$ Joint swelling   '[]'$ Joint pain   '[x]'$ Low back pain Hematologic:  '[]'$ Easy bruising  '[]'$ Easy bleeding   '[]'$ Hypercoagulable state   '[]'$ Anemic Gastrointestinal:  '[]'$ Diarrhea   '[]'$ Vomiting  '[x]'$ Gastroesophageal reflux/heartburn   '[]'$ Difficulty swallowing. Genitourinary:  '[]'$ Chronic kidney disease   '[]'$ Difficult urination  '[]'$ Frequent urination   '[]'$ Blood in urine Skin:  '[]'$ Rashes   '[]'$ Ulcers  Psychological:  '[]'$ History of anxiety   '[]'$   History of major depression.  Physical Examination  There were no vitals filed for this visit. There is no height or weight on file to calculate BMI. Gen: WD/WN, NAD Head: [place]/AT, No temporalis wasting.  Ear/Nose/Throat: Hearing grossly intact, nares w/o erythema or drainage Eyes: PER, EOMI, sclera nonicteric.  Neck: Supple, no masses.  No bruit or JVD.  Pulmonary:  Good air movement, no audible wheezing, no use of accessory muscles.  Cardiac: RRR, normal S1, S2, no Murmurs. Vascular:  mild trophic changes, no open wounds Vessel Right Left  Radial Palpable Palpable  PT Not Palpable Not Palpable  DP Not Palpable Not Palpable  Gastrointestinal: soft, non-distended. No guarding/no peritoneal signs.  Musculoskeletal: M/S 5/5 throughout.  No visible deformity.  Neurologic: CN 2-12 intact. Pain and light touch intact in extremities.  Symmetrical.  Speech is fluent. Motor exam as listed above. Psychiatric: Judgment intact, Mood & affect appropriate for pt's  clinical situation. Dermatologic: No rashes or ulcers noted.  No changes consistent with cellulitis.   CBC Lab Results  Component Value Date   WBC 9.4 05/31/2022   HGB 15.6 (H) 05/31/2022   HCT 48.4 (H) 05/31/2022   MCV 96.4 05/31/2022   PLT 207 05/31/2022    BMET    Component Value Date/Time   NA 136 04/21/2022 1143   NA 139 09/21/2015 0916   K 4.5 04/21/2022 1143   CL 102 04/21/2022 1143   CO2 27 04/21/2022 1143   GLUCOSE 103 (H) 04/21/2022 1143   BUN 9 04/21/2022 1143   BUN 12 09/21/2015 0916   CREATININE 0.76 04/21/2022 1143   CREATININE 0.88 05/16/2016 1034   CALCIUM 8.7 (L) 04/21/2022 1143   GFRNONAA >60 04/21/2022 1143   GFRNONAA 66 05/01/2016 0846   GFRAA >60 02/18/2020 0949   GFRAA 76 05/01/2016 0846   CrCl cannot be calculated (Patient's most recent lab result is older than the maximum 21 days allowed.).  COAG Lab Results  Component Value Date   INR 1.0 08/09/2020   INR 1.3 (H) 02/18/2020   INR 1.0 07/11/2019    Radiology VAS US CAROTID  Result Date: 08/07/2022 Carotid Arterial Duplex Study Patient Name:  MARYNA YEAGLE  Date of Exam:   08/07/2022 Medical Rec #: [id number]             Accession #:    [id number] Date of Birth: [birth year]              Patient Gender: F Patient Age:   88 years Exam Location:  [facility] Procedure:      VAS US CAROTID Referring Phys: [name] --------------------------------------------------------------------------------  Indications:  Carotid artery disease. Risk Factors: Hypertension, coronary artery disease, PAD. Performing Technologist: Delorise Shiner RVT  Examination Guidelines: A complete evaluation includes B-mode imaging, spectral Doppler, color Doppler, and power Doppler as needed of all accessible portions of each vessel. Bilateral testing is considered an integral part of a complete examination. Limited examinations for reoccurring indications may be performed as noted.  Right Carotid Findings:  +----------+--------+--------+--------+------------------+--------+           PSV cm/sEDV cm/sStenosisPlaque DescriptionComments +----------+--------+--------+--------+------------------+--------+ CCA Prox  106     15                                         +----------+--------+--------+--------+------------------+--------+ CCA Mid   71      21                                         +----------+--------+--------+--------+------------------+--------+  CCA Distal59      15                                         +----------+--------+--------+--------+------------------+--------+ ICA Prox  122     32      1-39%   diffuse                    +----------+--------+--------+--------+------------------+--------+ ICA Mid   74      22                                         +----------+--------+--------+--------+------------------+--------+ ICA Distal74      20                                         +----------+--------+--------+--------+------------------+--------+ ECA       89      18                                         +----------+--------+--------+--------+------------------+--------+ +----------+--------+-------+----------------+-------------------+           PSV cm/sEDV cmsDescribe        Arm Pressure (mmHG) +----------+--------+-------+----------------+-------------------+ [id number]            Multiphasic, WNL                    +----------+--------+-------+----------------+-------------------+ +---------+--------+--+--------+--+---------+ VertebralPSV cm/s42EDV cm/s12Antegrade +---------+--------+--+--------+--+---------+  Left Carotid Findings: +----------+--------+--------+--------+--------------------------+--------+           PSV cm/sEDV cm/sStenosisPlaque Description        Comments +----------+--------+--------+--------+--------------------------+--------+ CCA Prox  83      15                                                  +----------+--------+--------+--------+--------------------------+--------+ CCA Mid   71      22                                                 +----------+--------+--------+--------+--------------------------+--------+ CCA Distal62      22                                                 +----------+--------+--------+--------+--------------------------+--------+ ICA Prox  42      12      1-39%   heterogenous and irregular         +----------+--------+--------+--------+--------------------------+--------+ ICA Mid   88      28                                                 +----------+--------+--------+--------+--------------------------+--------+  ICA Distal113     31                                                 +----------+--------+--------+--------+--------------------------+--------+ ECA       73      16              irregular                          +----------+--------+--------+--------+--------------------------+--------+ +----------+--------+--------+----------------+-------------------+           PSV cm/sEDV cm/sDescribe        Arm Pressure (mmHG) +----------+--------+--------+----------------+-------------------+ [id number]             Multiphasic, WNL                    +----------+--------+--------+----------------+-------------------+ +---------+--------+--+--------+--+---------+ VertebralPSV cm/s52EDV cm/s12Antegrade +---------+--------+--+--------+--+---------+   Summary: Right Carotid: Velocities in the right ICA are consistent with a 1-39% stenosis. Left Carotid: Velocities in the left ICA are consistent with a 1-39% stenosis. Vertebrals:  Bilateral vertebral arteries demonstrate antegrade flow. Subclavians: Normal flow hemodynamics were seen in bilateral subclavian              arteries. *See table(s) above for measurements and observations.  Electronically signed by [name] MD on 08/07/2022 at 3:28:23 PM.    Final       Assessment/Plan 1. Atherosclerosis of native artery of left lower extremity with intermittent claudication (HCC) Recommend:   The patient has evidence of atherosclerosis of the lower extremities with claudication.  The patient does not voice lifestyle limiting changes at this point in time.   Noninvasive studies do not suggest clinically significant change.   No invasive studies, angiography or surgery at this time The patient should continue walking and begin a more formal exercise program.  The patient should continue antiplatelet therapy and aggressive treatment of the lipid abnormalities   No changes in the patient's medications at this time   Continued surveillance is indicated as atherosclerosis is likely to progress with time.     The patient will return in 6 months  2. Essential hypertension Continue antihypertensive medications as already ordered, these medications have been reviewed and there are no changes at this time.  3. Other hyperlipidemia Continue statin as ordered and reviewed, no changes at this time    [name], MD  08/28/2022 6:34 PM

## 2022-08-31 ENCOUNTER — Ambulatory Visit (INDEPENDENT_AMBULATORY_CARE_PROVIDER_SITE_OTHER): Payer: Medicare Other

## 2022-08-31 ENCOUNTER — Ambulatory Visit (INDEPENDENT_AMBULATORY_CARE_PROVIDER_SITE_OTHER): Payer: Medicare Other | Admitting: Nurse Practitioner

## 2022-08-31 ENCOUNTER — Encounter (INDEPENDENT_AMBULATORY_CARE_PROVIDER_SITE_OTHER): Payer: Self-pay | Admitting: Vascular Surgery

## 2022-08-31 VITALS — BP 149/81 | HR 85 | Ht 64.0 in | Wt 123.0 lb

## 2022-08-31 DIAGNOSIS — I25119 Atherosclerotic heart disease of native coronary artery with unspecified angina pectoris: Secondary | ICD-10-CM

## 2022-08-31 DIAGNOSIS — E7849 Other hyperlipidemia: Secondary | ICD-10-CM | POA: Diagnosis not present

## 2022-08-31 DIAGNOSIS — I10 Essential (primary) hypertension: Secondary | ICD-10-CM

## 2022-08-31 DIAGNOSIS — I6529 Occlusion and stenosis of unspecified carotid artery: Secondary | ICD-10-CM

## 2022-08-31 DIAGNOSIS — I70212 Atherosclerosis of native arteries of extremities with intermittent claudication, left leg: Secondary | ICD-10-CM

## 2022-09-04 ENCOUNTER — Ambulatory Visit
Payer: Medicare Other | Attending: Student in an Organized Health Care Education/Training Program | Admitting: Student in an Organized Health Care Education/Training Program

## 2022-09-04 ENCOUNTER — Ambulatory Visit
Admission: RE | Admit: 2022-09-04 | Discharge: 2022-09-04 | Disposition: A | Discharge status: Home / Self Care | Payer: Medicare Other | Source: Ambulatory Visit | Attending: Student in an Organized Health Care Education/Training Program | Admitting: Student in an Organized Health Care Education/Training Program

## 2022-09-04 ENCOUNTER — Encounter: Payer: Self-pay | Admitting: Student in an Organized Health Care Education/Training Program

## 2022-09-04 VITALS — BP 138/77 | HR 77 | Temp 97.2°F | Resp 16 | Ht 63.0 in | Wt 121.0 lb

## 2022-09-04 DIAGNOSIS — S22000S Wedge compression fracture of unspecified thoracic vertebra, sequela: Secondary | ICD-10-CM | POA: Insufficient documentation

## 2022-09-04 DIAGNOSIS — M47894 Other spondylosis, thoracic region: Secondary | ICD-10-CM | POA: Diagnosis not present

## 2022-09-04 DIAGNOSIS — G894 Chronic pain syndrome: Secondary | ICD-10-CM | POA: Diagnosis not present

## 2022-09-04 LAB — VAS US ABI WITH/WO TBI: Left ABI: 1.13

## 2022-09-04 MED ORDER — MIDAZOLAM HCL 5 MG/5ML IJ SOLN
0.5000 mg | Freq: Once | INTRAMUSCULAR | Status: AC
  Administered 2022-09-04: 1 mg via INTRAVENOUS

## 2022-09-04 MED ORDER — DEXAMETHASONE SODIUM PHOSPHATE 10 MG/ML IJ SOLN
10.0000 mg | Freq: Once | INTRAMUSCULAR | Status: AC
  Administered 2022-09-04: 10 mg

## 2022-09-04 MED ORDER — LACTATED RINGERS IV SOLN: Freq: Once | INTRAVENOUS | Status: AC

## 2022-09-04 MED ORDER — MIDAZOLAM HCL 2 MG/2ML IJ SOLN
INTRAMUSCULAR | Status: AC
  Filled 2022-09-04: qty 2

## 2022-09-04 MED ORDER — LIDOCAINE HCL 2 % IJ SOLN: 20.0000 mL | Freq: Once | INTRAMUSCULAR | Status: DC

## 2022-09-04 MED ORDER — DEXAMETHASONE SODIUM PHOSPHATE 10 MG/ML IJ SOLN
INTRAMUSCULAR | Status: AC
  Filled 2022-09-04: qty 1

## 2022-09-04 MED ORDER — ROPIVACAINE HCL 2 MG/ML IJ SOLN
INTRAMUSCULAR | Status: AC
  Filled 2022-09-04: qty 20

## 2022-09-04 MED ORDER — ROPIVACAINE HCL 2 MG/ML IJ SOLN
9.0000 mL | Freq: Once | INTRAMUSCULAR | Status: AC
  Administered 2022-09-04: 9 mL via PERINEURAL

## 2022-09-04 MED ORDER — LIDOCAINE HCL (PF) 1 % IJ SOLN
INTRAMUSCULAR | Status: AC
  Filled 2022-09-04: qty 5

## 2022-09-04 NOTE — Progress Notes (Signed)
Safety precautions to be maintained throughout the outpatient stay will include: orient to surroundings, keep bed in low position, maintain call bell within reach at all times, provide assistance with transfer out of bed and ambulation.  

## 2022-09-04 NOTE — Progress Notes (Signed)
PROVIDER NOTE: Interpretation of information contained herein should be left to medically-trained personnel. Specific patient instructions are provided elsewhere under "Patient Instructions" section of medical record. This document was created in part using STT-dictation technology, any transcriptional errors that may result from this process are unintentional.  Patient: Sara [name] Type: Established DOB: [birth year] MRN: [id number] PCP: Danelle Berry, NP  Service: Procedure DOS: 09/04/2022 Setting: Ambulatory Location: Ambulatory outpatient facility Delivery: Face-to-face Provider: Gillis Santa, MD Specialty: Interventional Pain Management Specialty designation: 09 Location: Outpatient facility Ref. Prov.: Danelle Berry, NP    Primary Reason for Visit: Interventional Pain Management Treatment. CC: Shoulder Pain (right)   Procedure #1:      Type: Thoracic facet medial branch block #2  Laterality: Right (-RT)  Level: T5 and T6 Medial Branch Level(s)  Imaging: Fluoroscopy-guided         Anesthesia: Local anesthesia (1-2% Lidocaine) Anxiolysis: IV Versed  DOS: 09/04/2022  Performed by: Gillis Santa, MD  Purpose: Diagnostic/Therapeutic Indications: Thoracic back pain severe enough to impact quality of life or function. Rationale (medical necessity): procedure needed and proper for the diagnosis and/or treatment of Sara [name]'s medical symptoms and needs. 1. Thoracic facet joint syndrome   2. Compression fracture of thoracic vertebra, unspecified thoracic vertebral level, sequela   3. Chronic pain syndrome    NAS-11 Pain score:   Pre-procedure: 3/10   Post-procedure: 0-No pain/10     Target: Thoracic posterior (dorsal) primary rami nerve Location: 2.5 cm lateral to midline (spinous process), just cephalad to upper transverse process edge.  (needle tip placement) Region: Thoracic  Approach: Paravertebral  Type of procedure: Percutaneous perineural nerve block    Pertinent Anatomy: There are two potential fascial compartments in the TPVS: the anterior extrapleural paravertebral compartment and the posterior subendothoracic paravertebral compartment. The transverse process and the superior costotransverse ligament form the posterior boundary.  Position / Prep / Materials:  Position: Prone  Prep solution: DuraPrep (Iodine Povacrylex [0.7% available iodine] and Isopropyl Alcohol, 74% w/w) Prep Area: Entire upper back region Materials:  Tray: Block Needle(s):  Type: Spinal  Gauge (G): 22  Length: 3.5-in  Qty: 2  Pre-op H&P Assessment:  Sara [name] is a 72 y.o. (year old), female patient, seen today for interventional treatment. She  has a past surgical history that includes Vascular surgery ([id number]); Hemorroidectomy (2014); Esophagogastroduodenoscopy (N/A, 12/21/2014); Esophagogastroduodenoscopy (egd) with propofol (N/A, 02/08/2016); Cardiac catheterization (N/A, 02/09/2016); Cardiac catheterization (Right, 02/10/2016); Cardiac catheterization (02/15/2017); Colonoscopy with propofol (N/A, 10/22/2017); Colonoscopy with propofol (N/A, 11/05/2017); polypectomy (11/05/2017); Tonsillectomy; Adenoidectomy; Lower Extremity Angiography (Right, 05/30/2019); Fasciotomy closure (Right, 06/04/2019); Artery Biopsy (Right, 07/14/2019); Fasciotomy (Right, 05/30/2019); Breast cyst aspiration (Left); Shoulder arthroscopy with rotator cuff repair and subacromial decompression (Right, 02/27/2020); Lower Extremity Angiography (Right, 07/02/2020); and Amputation (Right, 08/11/2020). Ms. Loseke has a current medication list which includes the following prescription(s): alendronate, atorvastatin, calcium carb-cholecalciferol, calcium carbonate, cholecalciferol, clonazepam, digoxin, duloxetine, gabapentin, losartan, omeprazole, actemra actpen, triamcinolone, and zolpidem, and the following Facility-Administered Medications: lactated ringers and lidocaine. Her primarily concern today is the  Shoulder Pain (right)  Initial Vital Signs:  Pulse/HCG Rate: 84ECG Heart Rate: 76 Temp: (!) 97.2 F (36.2 C) Resp: 18 BP: 109/81 SpO2: 96 %  BMI: Estimated body mass index is 21.43 kg/m as calculated from the following:   Height as of this encounter: '5\' 3"'$  (1.6 m).   Weight as of this encounter: 121 lb (54.9 kg).  Risk Assessment: Allergies: Reviewed. She is allergic to hydrocodone-acetaminophen, amoxicillin, metoprolol tartrate, oxycodone, and vicodin [  hydrocodone-acetaminophen].  Allergy Precautions: None required Coagulopathies: Reviewed. None identified.  Blood-thinner therapy: None at this time Active Infection(s): Reviewed. None identified. Sara [name] is afebrile  Site Confirmation: Sara [name] was asked to confirm the procedure and laterality before marking the site Procedure checklist: Completed Consent: Before the procedure and under the influence of no sedative(s), amnesic(s), or anxiolytics, the patient was informed of the treatment options, risks and possible complications. To fulfill our ethical and legal obligations, as recommended by the American Medical Association's Code of Ethics, I have informed the patient of my clinical impression; the nature and purpose of the treatment or procedure; the risks, benefits, and possible complications of the intervention; the alternatives, including doing nothing; the risk(s) and benefit(s) of the alternative treatment(s) or procedure(s); and the risk(s) and benefit(s) of doing nothing. The patient was provided information about the general risks and possible complications associated with the procedure. These may include, but are not limited to: failure to achieve desired goals, infection, bleeding, organ or nerve damage, allergic reactions, paralysis, and death. In addition, the patient was informed of those risks and complications associated to Spine-related procedures, such as failure to decrease pain; infection (i.e.: Meningitis,  epidural or intraspinal abscess); bleeding (i.e.: epidural hematoma, subarachnoid hemorrhage, or any other type of intraspinal or peri-dural bleeding); organ or nerve damage (i.e.: Any type of peripheral nerve, nerve root, or spinal cord injury) with subsequent damage to sensory, motor, and/or autonomic systems, resulting in permanent pain, numbness, and/or weakness of one or several areas of the body; allergic reactions; (i.e.: anaphylactic reaction); and/or death. Furthermore, the patient was informed of those risks and complications associated with the medications. These include, but are not limited to: allergic reactions (i.e.: anaphylactic or anaphylactoid reaction(s)); adrenal axis suppression; blood sugar elevation that in diabetics may result in ketoacidosis or comma; water retention that in patients with history of congestive heart failure may result in shortness of breath, pulmonary edema, and decompensation with resultant heart failure; weight gain; swelling or edema; medication-induced neural toxicity; particulate matter embolism and blood vessel occlusion with resultant organ, and/or nervous system infarction; and/or aseptic necrosis of one or more joints. Finally, the patient was informed that Medicine is not an exact science; therefore, there is also the possibility of unforeseen or unpredictable risks and/or possible complications that may result in a catastrophic outcome. The patient indicated having understood very clearly. We have given the patient no guarantees and we have made no promises. Enough time was given to the patient to ask questions, all of which were answered to the patient's satisfaction. Ms. Rosen has indicated that she wanted to continue with the procedure. Attestation: I, the ordering provider, attest that I have discussed with the patient the benefits, risks, side-effects, alternatives, likelihood of achieving goals, and potential problems during recovery for the procedure  that I have provided informed consent. Date  Time: 09/04/2022  8:53 AM  Pre-Procedure Preparation:  Monitoring: As per clinic protocol. Respiration, ETCO2, SpO2, BP, heart rate and rhythm monitor placed and checked for adequate function Safety Precautions: Patient was assessed for positional comfort and pressure points before starting the procedure. Time-out: I initiated and conducted the "Time-out" before starting the procedure, as per protocol. The patient was asked to participate by confirming the accuracy of the "Time Out" information. Verification of the correct person, site, and procedure were performed and confirmed by me, the nursing staff, and the patient. "Time-out" conducted as per Joint Commission's Universal Protocol (UP.01.01.01). Time: 0922  Description/Narrative of Procedure:  Technical description of procedure: RIGHT T5, T6 facet medial branch nerve block Rationale (medical necessity): procedure needed and proper for the diagnosis and/or treatment of the patient's medical symptoms and needs. Procedural Technique Safety Precautions: Aspiration looking for blood return was conducted prior to all injections. At no point did we inject any substances, as a needle was being advanced. No attempts were made at seeking any paresthesias. Safe injection practices and needle disposal techniques used. Medications properly checked for expiration dates. SDV (single dose vial) medications used. Target Area: For the thoracic medial branch nerve, the target is located between the top of the transverse processes at the point where the transverse process joints the vertebra and the superior-lateral aspect of the transverse process.  (More lateral towards the superior aspect of the thoracic spine.) For safety reasons and to minimize the risk of hemo- or pneumothorax, the needle tip was not advanced past the boundary of the posterior paravertebral compartment (superior costotransverse  ligament). Description of the Procedure: Protocol guidelines were followed. The patient was placed in position over the fluoroscopy table. The target area was identified and the area prepped in the usual manner. Skin & deeper tissues infiltrated with local anesthetic. Appropriate amount of time allowed to pass for local anesthetics to take effect. The procedure needles were then advanced to the target area. Proper needle placement secured. Negative aspiration confirmed. Solution injected in intermittent fashion, asking for systemic symptoms every 0.5cc of injectate. The needles were then removed and the area cleansed, making sure to leave some of the prepping solution back to take advantage of its long term bactericidal properties.  6 cc solution containing5 cc of 0.2% ropivacaine, 1 cc of Decadron 10 mg/cc.  3cc injected at each level above on the right          Vitals:   09/04/22 0918 09/04/22 0923 09/04/22 0927 09/04/22 0933  BP: (!) 154/89 (!) 137/91 125/84 138/77  Pulse:    77  Resp: '18 20 18 16  '$ Temp:    (!) 97.2 F (36.2 C)  TempSrc:      SpO2: 94% 93% 94% 97%  Weight:      Height:         Start Time: 0922 hrs. End Time: 0926 hrs.  Imaging Guidance:          Type of Imaging Technique: Fluoroscopy Guidance (Spinal) Indication(s): Assistance in needle guidance and placement for procedures requiring needle placement in or near specific anatomical locations not easily accessible without such assistance. Exposure Time: Please see nurses notes. Contrast: None used. Fluoroscopic Guidance: I was personally present during the use of fluoroscopy. "Tunnel Vision Technique" used to obtain the best possible view of the target area. Parallax error corrected before commencing the procedure. "Direction-depth-direction" technique used to introduce the needle under continuous pulsed fluoroscopy. Once target was reached, antero-posterior, oblique, and lateral fluoroscopic projection used confirm  needle placement in all planes. Images permanently stored in EMR. Ultrasound Guidance: N/A Interpretation: N/A  Post-operative Assessment:  Post-procedure Vital Signs:  Pulse/HCG Rate: 7782 Temp: (!) 97.2 F (36.2 C) Resp: 16 BP: 138/77 SpO2: 97 %  EBL: None  Complications: No immediate post-treatment complications observed by team, or reported by patient.  Note: The patient tolerated the entire procedure well. A repeat set of vitals were taken after the procedure and the patient was kept under observation following institutional policy, for this type of procedure. Post-procedural neurological assessment was performed, showing return to baseline, prior to discharge. The patient was provided  with post-procedure discharge instructions, including a section on how to identify potential problems. Should any problems arise concerning this procedure, the patient was given instructions to immediately contact us, at any time, without hesitation. In any case, we plan to contact the patient by telephone for a follow-up status report regarding this interventional procedure.  Comments:  No additional relevant information.  Plan of Care  Orders:  Orders Placed This Encounter  Procedures   DG PAIN CLINIC C-ARM 1-60 MIN NO REPORT    Intraoperative interpretation by procedural physician at [place].    Standing Status:   Standing    Number of Occurrences:   1    Order Specific Question:   Reason for exam:    Answer:   Assistance in needle guidance and placement for procedures requiring needle placement in or near specific anatomical locations not easily accessible without such assistance.     Medications ordered for procedure: Meds ordered this encounter  Medications   lidocaine (XYLOCAINE) 2 % (with pres) injection 400 mg   lactated ringers infusion   midazolam (VERSED) 5 MG/5ML injection 0.5-2 mg    Make sure Flumazenil is available in the pyxis when using this medication. If  oversedation occurs, administer 0.2 mg IV over 15 sec. If after 45 sec no response, administer 0.2 mg again over 1 min; may repeat at 1 min intervals; not to exceed 4 doses (1 mg)   dexamethasone (DECADRON) injection 10 mg   ropivacaine (PF) 2 mg/mL (0.2%) (NAROPIN) injection 9 mL   Medications administered: We administered lactated ringers, midazolam, dexamethasone, and ropivacaine (PF) 2 mg/mL (0.2%).  See the medical record for exact dosing, route, and time of administration.  Follow-up plan:   Return in about 4 weeks (around 10/02/2022) for Post Procedure Evaluation, virtual.       1.  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  Discussed treatment options which include titration of gabapentin to a more therapeutic dose.  Can also consider trial of Lyrica in future.  2.  Consider lidocaine infusion for right lower extremity phantom limb pain.  QTc checked from EKG in November 2022 and is appropriate.  Will need updated EKG if we are considering lidocaine infusion.  3.  Consider spinal cord stimulation for phantom limb pain.  Informed patient that there is mixed evidence on this but this could be a treatment option.  4.  Right rotator cuff arthropathy and dysfunction: Consider right suprascapular nerve block and possible RFA or suprascapular peripheral nerve stimulation.     Recent Visits Date Type Provider Dept  08/17/22 Office Visit Gillis Santa, MD [facility]  07/05/22 Procedure visit Gillis Santa, MD [facility]  06/26/22 Procedure visit Gillis Santa, MD [facility]  06/08/22 Office Visit Gillis Santa, MD [facility]  Showing recent visits within past 90 days and meeting all other requirements Today's Visits Date Type Provider Dept  09/04/22 Procedure visit Gillis Santa, MD [facility]  Showing today's visits and meeting all other requirements Future Appointments Date Type Provider Dept  10/02/22  Appointment Gillis Santa, MD [facility]  Showing future appointments within next 90 days and meeting all other requirements  Disposition: Discharge home  Discharge (Date  Time): 09/04/2022; 0935 hrs.   Primary Care Physician: Danelle Berry, NP Location: [facility] Note by: Gillis Santa, MD Date: 09/04/2022; Time: 9:58 AM  Disclaimer:  Medicine is not an exact science. The only guarantee in medicine  is that nothing is guaranteed. It is important to note that the decision to proceed with this intervention was based on the information collected from the patient. The Data and conclusions were drawn from the patient's questionnaire, the interview, and the physical examination. Because the information was provided in large part by the patient, it cannot be guaranteed that it has not been purposely or unconsciously manipulated. Every effort has been made to obtain as much relevant data as possible for this evaluation. It is important to note that the conclusions that lead to this procedure are derived in large part from the available data. Always take into account that the treatment will also be dependent on availability of resources and existing treatment guidelines, considered by other Pain Management Practitioners as being common knowledge and practice, at the time of the intervention. For Medico-Legal purposes, it is also important to point out that variation in procedural techniques and pharmacological choices are the acceptable norm. The indications, contraindications, technique, and results of the above procedure should only be interpreted and judged by a Board-Certified Interventional Pain Specialist with extensive familiarity and expertise in the same exact procedure and technique.

## 2022-09-04 NOTE — Patient Instructions (Signed)

## 2022-09-05 ENCOUNTER — Telehealth: Payer: Self-pay

## 2022-09-05 NOTE — Telephone Encounter (Signed)
Post procedure follow up.  Lm 

## 2022-09-12 ENCOUNTER — Telehealth: Payer: Self-pay | Admitting: Gastroenterology

## 2022-09-12 ENCOUNTER — Other Ambulatory Visit: Payer: Self-pay | Admitting: *Deleted

## 2022-09-12 DIAGNOSIS — Z86010 Personal history of colonic polyps: Secondary | ICD-10-CM

## 2022-09-12 MED ORDER — NA SULFATE-K SULFATE-MG SULF 17.5-3.13-1.6 GM/177ML PO SOLN: 1.0000 | Freq: Once | ORAL | 0 refills | Status: AC

## 2022-09-12 NOTE — Addendum Note (Signed)
Addended by: Jacqualin Combes on: 09/12/2022 03:25 PM   Modules accepted: Orders

## 2022-09-12 NOTE — Telephone Encounter (Signed)
Gastroenterology Pre-Procedure Review  Request Date: 11/13/2022 Requesting Physician: Dr. Allen Norris  PATIENT REVIEW QUESTIONS: The patient responded to the following health history questions as indicated:    1. Are you having any GI issues? yes (GERD) 2. Do you have a personal history of Polyps? yes (11/05/2017) 3. Do you have a family history of Colon Cancer or Polyps? no 4. Diabetes Mellitus? no 5. Joint replacements in the past 12 months?no 6. Major health problems in the past 3 months?no 7. Any artificial heart valves, MVP, or defibrillator?no    MEDICATIONS & ALLERGIES:    Patient reports the following regarding taking any anticoagulation/antiplatelet therapy:   Plavix, Coumadin, Eliquis, Xarelto, Lovenox, Pradaxa, Brilinta, or Effient? yes (Eliquis) Aspirin? no  Patient confirms/reports the following medications:  Current Outpatient Medications  Medication Sig Dispense Refill   apixaban (ELIQUIS) 2.5 MG TABS tablet Take 2.5 mg by mouth 2 (two) times daily.     Na Sulfate-K Sulfate-Mg Sulf 17.5-3.13-1.6 GM/177ML SOLN Take 1 kit by mouth once for 1 dose. 354 mL 0   omeprazole (PRILOSEC) 40 MG capsule Take 40 mg by mouth daily as needed.     Tocilizumab (ACTEMRA ACTPEN) 162 MG/0.9ML SOAJ Inject into the skin.     zolpidem (AMBIEN) 10 MG tablet Take 10 mg by mouth at bedtime as needed.     alendronate (FOSAMAX) 70 MG tablet Take 70 mg by mouth once a week. Take with a full glass of water on an empty stomach.     atorvastatin (LIPITOR) 40 MG tablet Take 40 mg by mouth at bedtime.      Calcium Carb-Cholecalciferol 600-10 MG-MCG TABS Take 1 tablet by mouth daily.     calcium carbonate (OSCAL) 1500 (600 Ca) MG TABS tablet Take 1,200 mg of elemental calcium by mouth 2 (two) times daily with a meal.     cholecalciferol (VITAMIN D3) 25 MCG (1000 UNIT) tablet Take 1,000 Units by mouth daily.     clonazePAM (KLONOPIN) 0.5 MG tablet Take 0.25-0.5 mg by mouth daily as needed.     digoxin (LANOXIN)  0.125 MG tablet Take 125 mcg by mouth daily.     DULoxetine (CYMBALTA) 60 MG capsule Take 60 mg by mouth 2 (two) times daily.     gabapentin (NEURONTIN) 800 MG tablet Take 1 tablet (800 mg total) by mouth 3 (three) times daily. 90 tablet 5   losartan (COZAAR) 25 MG tablet Take 25 mg by mouth every morning.      triamcinolone (KENALOG) 0.025 % cream Apply 1 Application topically at bedtime.     No current facility-administered medications for this visit.    Patient confirms/reports the following allergies:  Allergies  Allergen Reactions   Hydrocodone-Acetaminophen     Other reaction(s): Flushing Other reaction(s): Flushing   Amoxicillin Other (See Comments)    Yeast infection   Metoprolol Tartrate Rash   Oxycodone Itching   Vicodin [Hydrocodone-Acetaminophen] Hives and Rash    Flushing    No orders of the defined types were placed in this encounter.   AUTHORIZATION INFORMATION Primary Insurance: 1D#: Group #:  Secondary Insurance: 1D#: Group #:  SCHEDULE INFORMATION: Date: 11/13/2022 Time: Location: [facility]

## 2022-09-12 NOTE — Telephone Encounter (Signed)
Patient calling to schedule repeat colonoscopy.  

## 2022-09-12 NOTE — Telephone Encounter (Signed)
Colonoscopy schedule on 11/13/2022

## 2022-09-17 ENCOUNTER — Encounter (INDEPENDENT_AMBULATORY_CARE_PROVIDER_SITE_OTHER): Payer: Self-pay | Admitting: Nurse Practitioner

## 2022-09-18 ENCOUNTER — Telehealth: Payer: Self-pay

## 2022-09-18 NOTE — Telephone Encounter (Signed)
Patient left a voicemail and states at [facility] the prep for colonoscopy is 90 dollars. She thinks she can get it cheaper at [facility]. Patient [facility] prep [place] [facility] [place]

## 2022-09-19 ENCOUNTER — Other Ambulatory Visit (INDEPENDENT_AMBULATORY_CARE_PROVIDER_SITE_OTHER): Payer: Self-pay | Admitting: Nurse Practitioner

## 2022-09-19 DIAGNOSIS — I2510 Atherosclerotic heart disease of native coronary artery without angina pectoris: Secondary | ICD-10-CM

## 2022-09-19 DIAGNOSIS — I10 Essential (primary) hypertension: Secondary | ICD-10-CM

## 2022-09-19 DIAGNOSIS — Z89611 Acquired absence of right leg above knee: Secondary | ICD-10-CM

## 2022-09-19 DIAGNOSIS — E785 Hyperlipidemia, unspecified: Secondary | ICD-10-CM

## 2022-09-19 MED ORDER — PEG 3350-KCL-NABCB-NACL-NASULF 236 G PO SOLR: 4000.0000 mL | Freq: Once | ORAL | 0 refills | Status: AC

## 2022-09-19 MED ORDER — NA SULFATE-K SULFATE-MG SULF 17.5-3.13-1.6 GM/177ML PO SOLN: 1.0000 | Freq: Once | ORAL | 0 refills | Status: AC

## 2022-09-19 NOTE — Telephone Encounter (Signed)
Spoken to patient. I have inform that due to your insurance Suprep may not be covered. When we spoke the first time, patient insisted that her insurance will cover, she just did not want Golytely.  I have inform that we can send both to [facility] and she can pick up Rx that she can afford.  Patient verbalized understanding.

## 2022-10-02 ENCOUNTER — Ambulatory Visit
Payer: Medicare Other | Attending: Student in an Organized Health Care Education/Training Program | Admitting: Student in an Organized Health Care Education/Training Program

## 2022-10-02 ENCOUNTER — Other Ambulatory Visit: Payer: Self-pay

## 2022-10-02 DIAGNOSIS — I779 Disorder of arteries and arterioles, unspecified: Secondary | ICD-10-CM | POA: Diagnosis not present

## 2022-10-02 DIAGNOSIS — G894 Chronic pain syndrome: Secondary | ICD-10-CM | POA: Diagnosis not present

## 2022-10-02 DIAGNOSIS — Z9889 Other specified postprocedural states: Secondary | ICD-10-CM | POA: Diagnosis not present

## 2022-10-02 DIAGNOSIS — E7849 Other hyperlipidemia: Secondary | ICD-10-CM | POA: Diagnosis not present

## 2022-10-02 DIAGNOSIS — M12811 Other specific arthropathies, not elsewhere classified, right shoulder: Secondary | ICD-10-CM | POA: Insufficient documentation

## 2022-10-02 DIAGNOSIS — I10 Essential (primary) hypertension: Secondary | ICD-10-CM

## 2022-10-02 NOTE — Progress Notes (Signed)
Patient: Sara [name]  Service Category: E/M  Provider: Gillis Santa, MD  DOB: 1950-09-23  DOS: 10/02/2022  Location: Office  MRN: [id number]  Setting: Ambulatory outpatient  Referring Provider: Evern Bio, NP  Type: Established Patient  Specialty: Interventional Pain Management  PCP: Evern Bio, NP  Location: Remote location  Delivery: TeleHealth     Virtual Encounter - Pain Management PROVIDER NOTE: Information contained herein reflects review and annotations entered in association with encounter. Interpretation of such information and data should be left to medically-trained personnel. Information provided to patient can be located elsewhere in the medical record under "Patient Instructions". Document created using STT-dictation technology, any transcriptional errors that may result from process are unintentional.    Contact & Pharmacy Preferred: [phone] Home: [phone] (home) Mobile: [phone] (mobile) E-mail: [email or web address]  [id number] [place] - [id number]: [phone]: [phone] [address] [place] [address]Phone: [phone]: [phone]  Pre-screening  Sara [name] offered "in-person" vs "virtual" encounter. She indicated preferring virtual for this encounter.   Reason COVID-19*  Social distancing based on CDC and AMA recommendations.   I contacted Sara [name] 10/02/2022 via telephone.      I clearly identified myself as BGillis Santa MD. I verified that I was speaking with the correct person using two identifiers (Name: Sara [name] and date of birth: [birth year].  Consent I sought verbal advanced consent from Sara Salkfor virtual visit interactions. I informed Sara [name] possible security and privacy concerns, risks, and limitations  associated with providing "not-in-person" medical evaluation and management services. I also informed Sara [name] of the availability of "in-person" appointments. Finally, I informed her that there would be a charge for the virtual visit and that she could be  personally, fully or partially, financially responsible for it. [name] understanding and agreed to proceed.   Historic Elements   Sara [name] a 72y.o. year old, female patient evaluated today after our last contact on 09/04/2022. Ms. JSung has a past medical history of Arthritis, Depression, Dyspnea, GERD (gastroesophageal reflux disease), History of blood clots, Hyperlipidemia, Hypertension, Mild mitral regurgitation, Osteoporosis, Peripheral vascular disease ([place], Stomach ulcer, Vascular disease, Vertigo, and Wears dentures. She also  has a past surgical history that includes Vascular surgery ([id number]; Hemorroidectomy (2014); Esophagogastroduodenoscopy (N/A, 12/21/2014); Esophagogastroduodenoscopy (egd) with propofol (N/A, 02/08/2016); Cardiac catheterization (N/A, 02/09/2016); Cardiac catheterization (Right, 02/10/2016); Cardiac catheterization (02/15/2017); Colonoscopy with propofol (N/A, 10/22/2017); Colonoscopy with propofol (N/A, 11/05/2017); polypectomy (11/05/2017); Tonsillectomy; Adenoidectomy; Lower Extremity Angiography (Right, 05/30/2019); Fasciotomy closure (Right, 06/04/2019); Artery Biopsy (Right, 07/14/2019); Fasciotomy (Right, 05/30/2019); Breast cyst aspiration (Left); Shoulder arthroscopy with rotator cuff repair and subacromial decompression (Right, 02/27/2020); Lower Extremity Angiography (Right, 07/02/2020); and Amputation (Right, 08/11/2020). Sara [name] a current medication list which includes the following prescription(s): alendronate, apixaban, atorvastatin, calcium carb-cholecalciferol, calcium carbonate, cholecalciferol, clonazepam, digoxin, duloxetine, gabapentin, losartan, omeprazole, actemra  actpen, triamcinolone, and zolpidem. She  reports that she quit smoking about 31 years ago. Her smoking use included cigarettes. She has a 50.00 pack-year smoking history. She has never used smokeless tobacco. She reports that she does not drink alcohol and does not use drugs. [name] allergic to hydrocodone-acetaminophen, amoxicillin, metoprolol tartrate, oxycodone, and vicodin [hydrocodone-acetaminophen].  Estimated body mass index is 21.43 kg/m as calculated from the following:  Height as of 09/04/22: 5' 3"$  (1.6 m).   Weight as of 09/04/22: 121 lb (54.9 kg).  HPI  Today, she is being contacted for a post-procedure assessment.   Post-procedure evaluation   Type: Thoracic facet medial branch block #2  Laterality: Right (-RT)  Level: T5 and T6 Medial Branch Level(s)  Imaging: Fluoroscopy-guided         Anesthesia: Local anesthesia (1-2% Lidocaine) Anxiolysis: IV Versed  DOS: 09/04/2022  Performed by: Gillis Santa, MD  Purpose: Diagnostic/Therapeutic Indications: Thoracic back pain severe enough to impact quality of life or function. Rationale (medical necessity): procedure needed and proper for the diagnosis and/or treatment of Sara [name] medical symptoms and needs. 1. Thoracic facet joint syndrome   2. Compression fracture of thoracic vertebra, unspecified thoracic vertebral level, sequela   3. Chronic pain syndrome    NAS-11 Pain score:   Pre-procedure: 3/10   Post-procedure: 0-No pain/10      Effectiveness:  Initial hour after procedure: 100 %  Subsequent 4-6 hours post-procedure: 100 %  Analgesia past initial 6 hours: 100 % (lasting 2 days)  Ongoing improvement: Analgesic:  back to baseline Function: Back to baseline  Laboratory Chemistry Profile   Renal Lab Results  Component Value Date   BUN 9 04/21/2022   CREATININE 0.76 04/21/2022   BCR 13 09/21/2015   GFRAA >60 02/18/2020   GFRNONAA >60 04/21/2022    Hepatic Lab Results  Component Value Date    AST 32 04/21/2022   ALT 36 04/21/2022   ALBUMIN 4.0 04/21/2022   ALKPHOS 57 04/21/2022    Electrolytes Lab Results  Component Value Date   NA 136 04/21/2022   K 4.5 04/21/2022   CL 102 04/21/2022   CALCIUM 8.7 (L) 04/21/2022   MG 1.9 07/03/2020   PHOS 3.6 06/02/2019    Bone Lab Results  Component Value Date   VD25OH 45.8 09/21/2015    Inflammation (CRP: Acute Phase) (ESR: Chronic Phase) Lab Results  Component Value Date   ESRSEDRATE 58 (H) 07/03/2019   LATICACIDVEN 0.8 06/01/2019         Note: Above Lab results reviewed.  Imaging  VAS [place] ABI WITH/WO TBI  LOWER EXTREMITY DOPPLER STUDY  Patient Name:  Sara [name]  Date of Exam:   08/31/2022 Medical Rec #: [id number]             Accession #:    [id number] Date of Birth: 06-Dec-1950              Patient Gender: F Patient Age:   24 years Exam Location:  [facility] Procedure:      VAS [place] ABI WITH/WO TBI Referring Phys: [facility]  --------------------------------------------------------------------------------   Indications: Rest pain, and peripheral artery disease.  High Risk Factors: Hypertension, past history of smoking, coronary artery                    disease.   Vascular Interventions: 02/10/2016 PTA of right femoral to popliteal autogenous                         bypass graft. PTA right distal posterior tibial artery.                         12/2008 and 02/1990 Right femoral to popliteal autogenous  bypass graft.                           05/30/2019: PTA and Stent placement at 2 locations in                         the Femoral below knee popliteal bypass. Mechanical                         Thrombectomy Right Popliteal bypass and TibioPeroneal                         trunk and Proximal Peroneal Artery. Infusion of TPA to                         the Right Femoral-Popliteal Bypass.                           05/30/2019: Compartment Syndrome Right Lower Extremity                          Status Post Revascularization.                           06/04/2019: Compartment Syndrome Status Post                         Fasciotomies.  Performing Technologist: Delorise Shiner RVT    Examination Guidelines: A complete evaluation includes at minimum, Doppler waveform signals and systolic blood pressure reading at the level of bilateral brachial, anterior tibial, and posterior tibial arteries, when vessel segments are accessible. Bilateral testing is considered an integral part of a complete examination. Photoelectric Plethysmograph (PPG) waveforms and toe systolic pressure readings are included as required and additional duplex testing as needed. Limited examinations for reoccurring indications may be performed as noted.    ABI Findings: +--------+------------------+-----+--------+--------+ Right   Rt Pressure (mmHg)IndexWaveformComment  +--------+------------------+-----+--------+--------+ [id number]                                     +--------+------------------+-----+--------+--------+  +---------+------------------+-----+----------+-------+ Left     Lt Pressure (mmHg)IndexWaveform  Comment +---------+------------------+-----+----------+-------+ Brachial 123                                      +---------+------------------+-----+----------+-------+ PTA      139               1.13 triphasic         +---------+------------------+-----+----------+-------+ PERO     131               1.08 triphasic         +---------+------------------+-----+----------+-------+ DP       102               0.83 monophasic        +---------+------------------+-----+----------+-------+ Great Toe82                0.67                   +---------+------------------+-----+----------+-------+  +-------+-----------+-----------+------------+------------+ ABI/TBIToday's ABIToday's TBIPrevious ABIPrevious  TBI +-------+-----------+-----------+------------+------------+ Right  AKA                   AKA                      +-------+-----------+-----------+------------+------------+ Left   1.13       0.67       1.20        0.80         +-------+-----------+-----------+------------+------------+  Left ABIs appear essentially unchanged compared to prior study on 05/01/2022.   Summary: Left: Resting left ankle-brachial index is within normal range. The left toe-brachial index is abnormal.  *See table(s) above for measurements and observations.    Electronically signed by [name] MD on 09/04/2022 at 4:49:06 PM.      Final   DG PAIN CLINIC C-ARM 1-60 MIN NO REPORT Fluoro was used, but no Radiologist interpretation will be provided.  Please refer to "NOTES" tab for provider progress note.  Assessment  The primary encounter diagnosis was Rotator cuff arthropathy of right shoulder. Diagnoses of Hx of rotator cuff surgery and Chronic pain syndrome were also pertinent to this visit.  Plan of Care  Patient continues to have persistent right shoulder pain that is worse with right arm movement.  She has a history of right rotator cuff surgery for rotator cuff arthropathy.  We discussed a right suprascapular nerve block to help address her pain and if this is effective we could consider radiofrequency ablation of the suprascapular nerve.  Risks and benefits of this procedure were discussed with patient and she would like to continue.  Orders:  Orders Placed This Encounter  Procedures   SUPRASCAPULAR NERVE BLOCK    For shoulder pain.    Standing Status:   Future    Standing Expiration Date:   12/31/2022    Scheduling Instructions:     RIGHT     Level(s): Suprascapular notch     Sedation: Pwithout     Scheduling Timeframe: As permitted by the schedule    Order Specific Question:   Where will this procedure be performed?    Answer:   [facility]   Follow-up plan:    Return in about 1 week (around 10/09/2022) for Right SSNB , in clinic NS.     1.  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  Discussed treatment options which include titration of gabapentin to a more therapeutic dose.  Can also consider trial of Lyrica in future.  2.  Consider lidocaine infusion for right lower extremity phantom limb pain.  QTc checked from EKG in November 2022 and is appropriate.  Will need updated EKG if we are considering lidocaine infusion.  3.  Consider spinal cord stimulation for phantom limb pain.  Informed patient that there is mixed evidence on this but this could be a treatment option.  4.  Right rotator cuff arthropathy and dysfunction: Consider right suprascapular nerve block and possible RFA or suprascapular peripheral nerve stimulation.      Recent Visits Date Type Provider Dept  09/04/22 Procedure visit Gillis Santa, MD [facility]  08/17/22 Office Visit Gillis Santa, MD [facility]  07/05/22 Procedure visit Gillis Santa, MD [facility]  Showing recent visits within past 90 days and meeting all other requirements Today's Visits Date Type Provider Dept  10/02/22 Office Visit Gillis Santa, MD [facility]  Showing today's visits and meeting all other requirements Future Appointments No visits were found  meeting these conditions. Showing future appointments within next 90 days and meeting all other requirements  I discussed the assessment and treatment plan with the patient. The patient was provided an opportunity to ask questions and all were answered. The patient agreed with the plan and demonstrated an understanding of the instructions.  Patient advised to call back or seek an in-person evaluation if the symptoms or condition worsens.  Duration of encounter: 43mnutes.  Note by: BGillis Santa MD Date: 10/02/2022; Time: 1:46 PM

## 2022-10-02 NOTE — Patient Instructions (Signed)
______________________________________________________________________  Preparing for your procedure  During your procedure appointment there will be: No Prescription Refills. No disability issues to discussed. No medication changes or discussions.  Instructions: Food intake: Avoid eating anything solid for at least 8 hours prior to your procedure. Clear liquid intake: You may take clear liquids such as water up to 2 hours prior to your procedure. (No carbonated drinks. No soda.) Transportation: Unless otherwise stated by your physician, bring a driver. Morning Medicines: Except for blood thinners, take all of your other morning medications with a sip of water. Make sure to take your heart and blood pressure medicines. If your blood pressure's lower number is above 100, the case will be rescheduled. Blood thinners: Make sure to stop your blood thinners as instructed.  If you take a blood thinner, but were not instructed to stop it, call our office [phone] and ask to talk to a nurse. Not stopping a blood thinner prior to certain procedures could lead to serious complications. Diabetics on insulin: Notify the staff so that you can be scheduled 1st case in the morning. If your diabetes requires high dose insulin, take only  of your normal insulin dose the morning of the procedure and notify the staff that you have done so. Preventing infections: Shower with an antibacterial soap the morning of your procedure.  Build-up your immune system: Take 1000 mg of Vitamin C with every meal (3 times a day) the day prior to your procedure. Antibiotics: Inform the nursing staff if you are taking any antibiotics or if you have any conditions that may require antibiotics prior to procedures. (Example: recent joint implants)   Pregnancy: If you are pregnant make sure to notify the nursing staff. Not doing so may result in injury to the fetus, including death.  Sickness: If you have a cold, fever, or any  active infections, call and cancel or reschedule your procedure. Receiving steroids while having an infection may result in complications. Arrival: You must be in the facility at least 30 minutes prior to your scheduled procedure. Tardiness: Your scheduled time is also the cutoff time. If you do not arrive at least 15 minutes prior to your procedure, you will be rescheduled.  Children: Do not bring any children with you. Make arrangements to keep them home. Dress appropriately: There is always a possibility that your clothing may get soiled. Avoid long dresses. Valuables: Do not bring any jewelry or valuables.  Reasons to call and reschedule or cancel your procedure: (Following these recommendations will minimize the risk of a serious complication.) Surgeries: Avoid having procedures within 2 weeks of any surgery. (Avoid for 2 weeks before or after any surgery). Flu Shots: Avoid having procedures within 2 weeks of a flu shots or . (Avoid for 2 weeks before or after immunizations). Barium: Avoid having a procedure within 7-10 days after having had a radiological study involving the use of radiological contrast. (Myelograms, Barium swallow or enema study). Heart attacks: Avoid any elective procedures or surgeries for the initial 6 months after a "Myocardial Infarction" (Heart Attack). Blood thinners: It is imperative that you stop these medications before procedures. Let us know if you if you take any blood thinner.  Infection: Avoid procedures during or within two weeks of an infection (including chest colds or gastrointestinal problems). Symptoms associated with infections include: Localized redness, fever, chills, night sweats or profuse sweating, burning sensation when voiding, cough, congestion, stuffiness, runny nose, sore throat, diarrhea, nausea, vomiting, cold or Flu symptoms, recent or  current infections. It is specially important if the infection is over the area that we intend to treat. Heart  and lung problems: Symptoms that may suggest an active cardiopulmonary problem include: cough, chest pain, breathing difficulties or shortness of breath, dizziness, ankle swelling, uncontrolled high or unusually low blood pressure, and/or palpitations. If you are experiencing any of these symptoms, cancel your procedure and contact your primary care physician for an evaluation.  Remember:  Regular Business hours are:  Monday to Thursday 8:00 AM to 4:00 PM  Provider's Schedule: Milinda Pointer, MD:  Procedure days: Tuesday and Thursday 7:30 AM to 4:00 PM  Gillis Santa, MD:  Procedure days: Monday and Wednesday 7:30 AM to 4:00 PM  ______________________________________________________________________

## 2022-10-03 ENCOUNTER — Encounter: Payer: Self-pay | Admitting: Nurse Practitioner

## 2022-10-03 ENCOUNTER — Ambulatory Visit (INDEPENDENT_AMBULATORY_CARE_PROVIDER_SITE_OTHER): Payer: Self-pay | Admitting: Nurse Practitioner

## 2022-10-03 VITALS — BP 120/78 | HR 96 | Ht 63.0 in | Wt 138.8 lb

## 2022-10-03 DIAGNOSIS — F331 Major depressive disorder, recurrent, moderate: Secondary | ICD-10-CM | POA: Diagnosis not present

## 2022-10-03 DIAGNOSIS — E782 Mixed hyperlipidemia: Secondary | ICD-10-CM

## 2022-10-03 DIAGNOSIS — I25119 Atherosclerotic heart disease of native coronary artery with unspecified angina pectoris: Secondary | ICD-10-CM | POA: Diagnosis not present

## 2022-10-03 DIAGNOSIS — M810 Age-related osteoporosis without current pathological fracture: Secondary | ICD-10-CM | POA: Diagnosis not present

## 2022-10-03 DIAGNOSIS — M62261 Nontraumatic ischemic infarction of muscle, right lower leg: Secondary | ICD-10-CM

## 2022-10-03 DIAGNOSIS — Z89611 Acquired absence of right leg above knee: Secondary | ICD-10-CM | POA: Diagnosis not present

## 2022-10-03 DIAGNOSIS — M62838 Other muscle spasm: Secondary | ICD-10-CM

## 2022-10-03 DIAGNOSIS — K219 Gastro-esophageal reflux disease without esophagitis: Secondary | ICD-10-CM

## 2022-10-03 DIAGNOSIS — I872 Venous insufficiency (chronic) (peripheral): Secondary | ICD-10-CM | POA: Diagnosis not present

## 2022-10-03 LAB — LIPID PANEL
Chol/HDL Ratio: 2.1 ratio (ref 0.0–4.4)
Cholesterol, Total: 117 mg/dL (ref 100–199)
HDL: 55 mg/dL (ref >39)
LDL Chol Calc (NIH): 46 mg/dL (ref 0–99)
Triglycerides: 79 mg/dL (ref 0–149)
VLDL Cholesterol Cal: 16 mg/dL (ref 5–40)

## 2022-10-03 LAB — CBC WITH DIFFERENTIAL
Basophils Absolute: 0.1 10*3/uL (ref 0.0–0.2)
Basos: 1 %
EOS (ABSOLUTE): 0.2 10*3/uL (ref 0.0–0.4)
Eos: 2 %
Hematocrit: 45.9 % (ref 34.0–46.6)
Hemoglobin: 15.3 g/dL (ref 11.1–15.9)
Immature Grans (Abs): 0.1 10*3/uL (ref 0.0–0.1)
Immature Granulocytes: 1 %
Lymphocytes Absolute: 0.5 10*3/uL — ABNORMAL LOW (ref 0.7–3.1)
Lymphs: 4 %
MCH: 31.4 pg (ref 26.6–33.0)
MCHC: 33.3 g/dL (ref 31.5–35.7)
MCV: 94 fL (ref 79–97)
Monocytes Absolute: 0.7 10*3/uL (ref 0.1–0.9)
Monocytes: 6 %
Neutrophils Absolute: 10.8 10*3/uL — ABNORMAL HIGH (ref 1.4–7.0)
Neutrophils: 86 %
RBC: 4.88 x10E6/uL (ref 3.77–5.28)
RDW: 12.9 % (ref 11.7–15.4)
WBC: 12.5 10*3/uL — ABNORMAL HIGH (ref 3.4–10.8)

## 2022-10-03 LAB — CMP14+EGFR
ALT: 41 IU/L — ABNORMAL HIGH (ref 0–32)
AST: 33 IU/L (ref 0–40)
Albumin/Globulin Ratio: 2.3 — ABNORMAL HIGH (ref 1.2–2.2)
Albumin: 4.2 g/dL (ref 3.9–4.9)
Alkaline Phosphatase: 83 IU/L (ref 44–121)
BUN/Creatinine Ratio: 18 (ref 12–28)
BUN: 14 mg/dL (ref 8–27)
Bilirubin Total: 0.5 mg/dL (ref 0.0–1.2)
CO2: 19 mmol/L — ABNORMAL LOW (ref 20–29)
Calcium: 9.2 mg/dL (ref 8.7–10.3)
Chloride: 105 mmol/L (ref 96–106)
Creatinine, Ser: 0.8 mg/dL (ref 0.57–1.00)
Globulin, Total: 1.8 g/dL (ref 1.5–4.5)
Glucose: 99 mg/dL (ref 70–99)
Potassium: 4.5 mmol/L (ref 3.5–5.2)
Sodium: 141 mmol/L (ref 134–144)
Total Protein: 6 g/dL (ref 6.0–8.5)
eGFR: 79 mL/min/{1.73_m2} (ref >59)

## 2022-10-03 NOTE — Progress Notes (Signed)
Established Patient Office Visit  Subjective:  Patient ID: Sara [name], female    DOB: June 11, 1951  Age: 72 y.o. MRN: [id number]  Chief Complaint  Patient presents with   Follow-up    6 month follow up with  labs    4 month follow up and lab results review.  BP at goal today.  LDL at goal.  A1c was not done.  Continues pain mgmt.  Reports sleeping well.  Pt is going to [facility] for oncology to see if she has protein chains.     Past Medical History:  Diagnosis Date   Arthritis    right shoulder   Depression    Dyspnea    GERD (gastroesophageal reflux disease)    History of blood clots    Hyperlipidemia    Hypertension    Mild mitral regurgitation    Osteoporosis    Peripheral vascular disease (HCC)    Stomach ulcer    Vascular disease    Sees Dr. Delana Meyer   Vertigo    Last episode approx Aug 2015   Wears dentures    full upper    Social History   Socioeconomic History   Marital status: Single    Spouse name: Not on file   Number of children: Not on file   Years of education: Not on file   Highest education level: Not on file  Occupational History   Not on file  Tobacco Use   Smoking status: Former    Packs/day: 2.00    Years: 25.00    Total pack years: 50.00    Types: Cigarettes    Quit date: 08/22/1991    Years since quitting: 31.1   Smokeless tobacco: Never  Vaping Use   Vaping Use: Never used  Substance and Sexual Activity   Alcohol use: No    Alcohol/week: 0.0 standard drinks of alcohol   Drug use: Never   Sexual activity: Not Currently  Other Topics Concern   Not on file  Social History Narrative   Not on file   Social Determinants of Health   Financial Resource Strain: Not on file  Food Insecurity: Not on file  Transportation Needs: Not on file  Physical Activity: Not on file  Stress: Not on file  Social Connections: Not on file  Intimate Partner Violence: Not on file    Family History  Problem Relation Age of Onset    CVA Mother    Heart attack Mother    Cancer Father        colon cancer   Colon cancer Father    Heart disease Maternal Uncle    Breast cancer Neg Hx     Allergies  Allergen Reactions   Hydrocodone-Acetaminophen     Other reaction(s): Flushing Other reaction(s): Flushing   Amoxicillin Other (See Comments)    Yeast infection   Metoprolol Tartrate Rash   Oxycodone Itching   Vicodin [Hydrocodone-Acetaminophen] Hives and Rash    Flushing    Review of Systems  Constitutional: Negative.   HENT: Negative.    Eyes: Negative.   Respiratory:  Positive for cough.   Cardiovascular: Negative.   Gastrointestinal: Negative.   Genitourinary: Negative.   Musculoskeletal:  Positive for back pain and neck pain.  Skin: Negative.   Neurological: Negative.   Endo/Heme/Allergies: Negative.   Psychiatric/Behavioral:  The patient is nervous/anxious.        Objective:   BP 120/78   Pulse 96   Ht 5' 3"$  (1.6 m)  Wt 138 lb 12.8 oz (63 kg)   SpO2 95%   BMI 24.59 kg/m   Vitals:   10/03/22 1000  BP: 120/78  Pulse: 96  Height: 5' 3"$  (1.6 m)  Weight: 138 lb 12.8 oz (63 kg)  SpO2: 95%  BMI (Calculated): 24.59    Physical Exam Vitals reviewed.  Constitutional:      Appearance: Normal appearance.  HENT:     Head: Normocephalic.     Nose: Nose normal.     Mouth/Throat:     Mouth: Mucous membranes are moist.  Eyes:     Pupils: Pupils are equal, round, and reactive to light.  Cardiovascular:     Rate and Rhythm: Normal rate and regular rhythm.  Pulmonary:     Effort: Pulmonary effort is normal.     Breath sounds: Normal breath sounds.  Abdominal:     General: Bowel sounds are normal.     Palpations: Abdomen is soft.  Musculoskeletal:     Cervical back: Neck supple.     Right lower leg: Edema present.     Comments: Right AKA   Skin:    General: Skin is warm and dry.  Neurological:     Mental Status: She is alert and oriented to person, place, and time.  Psychiatric:         Mood and Affect: Mood normal.        Behavior: Behavior normal.      No results found for any visits on 10/03/22.  Recent Results (from the past 2160 hour(s))  VAS [place] ABI WITH/WO TBI     Status: None   Collection Time: 08/31/22  2:49 PM  Result Value Ref Range   Right ABI AKA    Left ABI 1.13   CMP14+EGFR     Status: Abnormal   Collection Time: 10/02/22 10:44 AM  Result Value Ref Range   Glucose 99 70 - 99 mg/dL   BUN 14 8 - 27 mg/dL   Creatinine, Ser 0.80 0.57 - 1.00 mg/dL   eGFR 79 >59 mL/min/1.73   BUN/Creatinine Ratio 18 12 - 28   Sodium 141 134 - 144 mmol/L   Potassium 4.5 3.5 - 5.2 mmol/L   Chloride 105 96 - 106 mmol/L   CO2 19 (L) 20 - 29 mmol/L   Calcium 9.2 8.7 - 10.3 mg/dL   Total Protein 6.0 6.0 - 8.5 g/dL   Albumin 4.2 3.9 - 4.9 g/dL   Globulin, Total 1.8 1.5 - 4.5 g/dL   Albumin/Globulin Ratio 2.3 (H) 1.2 - 2.2   Bilirubin Total 0.5 0.0 - 1.2 mg/dL   Alkaline Phosphatase 83 44 - 121 IU/L   AST 33 0 - 40 IU/L   ALT 41 (H) 0 - 32 IU/L  CBC With Differential     Status: Abnormal   Collection Time: 10/02/22 10:44 AM  Result Value Ref Range   WBC 12.5 (H) 3.4 - 10.8 x10E3/uL   RBC 4.88 3.77 - 5.28 x10E6/uL   Hemoglobin 15.3 11.1 - 15.9 g/dL   Hematocrit 45.9 34.0 - 46.6 %   MCV 94 79 - 97 fL   MCH 31.4 26.6 - 33.0 pg   MCHC 33.3 31.5 - 35.7 g/dL   RDW 12.9 11.7 - 15.4 %   Neutrophils 86 Not Estab. %   Lymphs 4 Not Estab. %   Monocytes 6 Not Estab. %   Eos 2 Not Estab. %   Basos 1 Not Estab. %   Neutrophils Absolute 10.8 (  H) 1.4 - 7.0 x10E3/uL   Lymphocytes Absolute 0.5 (L) 0.7 - 3.1 x10E3/uL   Monocytes Absolute 0.7 0.1 - 0.9 x10E3/uL   EOS (ABSOLUTE) 0.2 0.0 - 0.4 x10E3/uL   Basophils Absolute 0.1 0.0 - 0.2 x10E3/uL   Immature Granulocytes 1 Not Estab. %   Immature Grans (Abs) 0.1 0.0 - 0.1 x10E3/uL  Lipid Profile     Status: None   Collection Time: 10/02/22 10:44 AM  Result Value Ref Range   Cholesterol, Total 117 100 - 199 mg/dL    Triglycerides 79 0 - 149 mg/dL   HDL 55 >39 mg/dL   VLDL Cholesterol Cal 16 5 - 40 mg/dL   LDL Chol Calc (NIH) 46 0 - 99 mg/dL   Chol/HDL Ratio 2.1 0.0 - 4.4 ratio    Comment:                                   T. 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   Total time spent: 25 minutes  Evern Bio, NP  10/03/2022

## 2022-10-03 NOTE — Patient Instructions (Signed)
1) Reviewed labs, low carb and no conc sweets 2) Colonoscopy March 25 - Dr. Allen Norris 3) Follow up appt in 4 months, fasting labs prior

## 2022-10-05 ENCOUNTER — Telehealth (INDEPENDENT_AMBULATORY_CARE_PROVIDER_SITE_OTHER): Payer: Self-pay | Admitting: Vascular Surgery

## 2022-10-05 NOTE — Telephone Encounter (Signed)
Leg is firey red and has 15-20 blisters on end of leg. Can't wear prosthesis.  Just started yesterday - spent day at [place] in [place]. Wants to have an appt - please advise if any studies are wanted.

## 2022-10-05 NOTE — Telephone Encounter (Signed)
Spoke with pt and she will be coming in to see Eulogio Ditch, NP tomorrow 2.16.24 at 11:00. Pt confirmed.

## 2022-10-05 NOTE — Telephone Encounter (Signed)
Bring patient in tomorrow per Dr Delana Meyer

## 2022-10-06 ENCOUNTER — Encounter (INDEPENDENT_AMBULATORY_CARE_PROVIDER_SITE_OTHER): Payer: Self-pay | Admitting: Nurse Practitioner

## 2022-10-06 ENCOUNTER — Ambulatory Visit (INDEPENDENT_AMBULATORY_CARE_PROVIDER_SITE_OTHER): Payer: Medicare Other | Admitting: Nurse Practitioner

## 2022-10-06 VITALS — BP 117/71 | HR 96 | Resp 18 | Ht 63.0 in | Wt 128.0 lb

## 2022-10-06 DIAGNOSIS — Z89611 Acquired absence of right leg above knee: Secondary | ICD-10-CM | POA: Diagnosis not present

## 2022-10-06 DIAGNOSIS — I10 Essential (primary) hypertension: Secondary | ICD-10-CM | POA: Diagnosis not present

## 2022-10-06 MED ORDER — DOXYCYCLINE HYCLATE 100 MG PO CAPS: 100.0000 mg | ORAL_CAPSULE | Freq: Two times a day (BID) | ORAL | 0 refills | Status: DC

## 2022-10-08 ENCOUNTER — Encounter (INDEPENDENT_AMBULATORY_CARE_PROVIDER_SITE_OTHER): Payer: Self-pay | Admitting: Nurse Practitioner

## 2022-10-08 NOTE — Progress Notes (Signed)
Subjective:    Patient ID: Sara [name], female    DOB: Mar 12, 1951, 72 y.o.   MRN: [id number] Chief Complaint  Patient presents with   Follow-up    Follow up Consult Leg is firey red and has 15-20 blisters on end of leg. Can't wear prosthesis. Just started yesterday - spent day at [place] in [place]. Wants to have an appt - please advise if any studies are wanted.    Sara [name] is a 72 year old female who presents today for evaluation of her right residual limb after a recent development of blisters and redness.  She notes that the redness has resolved but the blisters are still present.  This happened after the patient wore her prosthetic for an extended amount of time while she visited the [place].  She notes that the area is not significantly painful.  There is about 15-20 small fluid-filled blisters on the end of her residual limb.  They appear to be serous fluid and not with any purulent drainage.    Review of Systems  Skin:  Positive for wound.  All other systems reviewed and are negative.      Objective:   Physical Exam Vitals reviewed.  HENT:     Head: Normocephalic.  Cardiovascular:     Rate and Rhythm: Normal rate.  Pulmonary:     Effort: Pulmonary effort is normal.  Musculoskeletal:     Right Lower Extremity: Right leg is amputated above knee.  Skin:    General: Skin is warm and dry.  Neurological:     Mental Status: She is alert and oriented to person, place, and time.  Psychiatric:        Mood and Affect: Mood normal.        Behavior: Behavior normal.        Thought Content: Thought content normal.        Judgment: Judgment normal.     BP 117/71 (BP Location: Left Arm)   Pulse 96   Resp 18   Ht 5' 3"$  (1.6 m)   Wt 128 lb (58.1 kg)   BMI 22.67 kg/m   Past Medical History:  Diagnosis Date   Arthritis    right shoulder   Depression    Dyspnea    GERD (gastroesophageal reflux disease)    History of blood clots    Hyperlipidemia    Hypertension     Mild mitral regurgitation    Osteoporosis    Peripheral vascular disease ([place])    Stomach ulcer    Vascular disease    Sees Dr. Delana Meyer   Vertigo    Last episode approx Aug 2015   Wears dentures    full upper    Social History   Socioeconomic History   Marital status: Single    Spouse name: Not on file   Number of children: Not on file   Years of education: Not on file   Highest education level: Not on file  Occupational History   Not on file  Tobacco Use   Smoking status: Former    Packs/day: 2.00    Years: 25.00    Total pack years: 50.00    Types: Cigarettes    Quit date: 08/22/1991    Years since quitting: 31.1   Smokeless tobacco: Never  Vaping Use   Vaping Use: Never used  Substance and Sexual Activity   Alcohol use: No    Alcohol/week: 0.0 standard drinks of alcohol   Drug use: Never   Sexual  activity: Not Currently  Other Topics Concern   Not on file  Social History Narrative   Not on file   Social Determinants of Health   Financial Resource Strain: Not on file  Food Insecurity: Not on file  Transportation Needs: Not on file  Physical Activity: Not on file  Stress: Not on file  Social Connections: Not on file  Intimate Partner Violence: Not on file    Past Surgical History:  Procedure Laterality Date   ADENOIDECTOMY     AMPUTATION Right 08/11/2020   Procedure: AMPUTATION ABOVE KNEE;  Surgeon: [name], MD;  Location: [facility];  Service: Vascular;  Laterality: Right;   ARTERY BIOPSY Right 07/14/2019   Procedure: BIOPSY TEMPORAL ARTERY;  Surgeon: [name], MD;  Location: [facility];  Service: Vascular;  Laterality: Right;   BREAST CYST ASPIRATION Left    CARDIAC CATHETERIZATION  02/15/2017   [facility]   COLONOSCOPY WITH PROPOFOL N/A 10/22/2017   Procedure: COLONOSCOPY WITH PROPOFOL;  Surgeon: Lucilla Lame, MD;  Location: [facility];  Service: Endoscopy;  Laterality: N/A;  specimens not taken--pt on Plavix will be brought back in  after 7 days off med   COLONOSCOPY WITH PROPOFOL N/A 11/05/2017   Procedure: COLONOSCOPY WITH PROPOFOL;  Surgeon: Lucilla Lame, MD;  Location: [facility];  Service: Endoscopy;  Laterality: N/A;   ESOPHAGOGASTRODUODENOSCOPY N/A 12/21/2014   Procedure: ESOPHAGOGASTRODUODENOSCOPY (EGD);  Surgeon: Lucilla Lame, MD;  Location: [facility];  Service: Gastroenterology;  Laterality: N/A;   ESOPHAGOGASTRODUODENOSCOPY (EGD) WITH PROPOFOL N/A 02/08/2016   Procedure: ESOPHAGOGASTRODUODENOSCOPY (EGD) WITH PROPOFOL;  Surgeon: Lucilla Lame, MD;  Location: [facility];  Service: Endoscopy;  Laterality: N/A;   FASCIOTOMY Right 05/30/2019   Procedure: FASCIOTOMY;  Surgeon: [name], MD;  Location: [facility];  Service: Vascular;  Laterality: Right;   FASCIOTOMY CLOSURE Right 06/04/2019   Procedure: FASCIOTOMY CLOSURE;  Surgeon: [name], MD;  Location: [facility];  Service: Vascular;  Laterality: Right;   HEMORROIDECTOMY  2014   LOWER EXTREMITY ANGIOGRAPHY Right 05/30/2019   Procedure: LOWER EXTREMITY ANGIOGRAPHY;  Surgeon: [name], MD;  Location: [place] [facility];  Service: Cardiovascular;  Laterality: Right;   LOWER EXTREMITY ANGIOGRAPHY Right 07/02/2020   Procedure: LOWER EXTREMITY ANGIOGRAPHY;  Surgeon: [name], MD;  Location: [place];  Service: Cardiovascular;  Laterality: Right;   PERIPHERAL VASCULAR CATHETERIZATION N/A 02/09/2016   Procedure: Abdominal Aortogram w/Lower Extremity;  Surgeon: [name], MD;  Location: [place] [facility];  Service: Cardiovascular;  Laterality: N/A;   PERIPHERAL VASCULAR CATHETERIZATION Right 02/10/2016   Procedure: Lower Extremity Angiography;  Surgeon: [name], MD;  Location: [place] [facility];  Service: Cardiovascular;  Laterality: Right;   POLYPECTOMY  11/05/2017   Procedure: POLYPECTOMY;  Surgeon: Lucilla Lame, MD;  Location: [facility];  Service: Endoscopy;;   SHOULDER  ARTHROSCOPY WITH ROTATOR CUFF REPAIR AND SUBACROMIAL DECOMPRESSION Right 02/27/2020   Procedure: RIGHT SHOULDER ARTHROSCOPY SUBACROMIAL DECOMPRESSION, DISTAL CLAVICLE EXCISION AND MINI-OPEN ROTATOR CUFF REPAIR;  Surgeon: Thornton Park, MD;  Location: [facility];  Service: Orthopedics;  Laterality: Right;   TONSILLECTOMY     VASCULAR SURGERY  [id number]   Fem-Pop Bypass    Family History  Problem Relation Age of Onset   CVA Mother    Heart attack Mother    Cancer Father        colon cancer   Colon cancer Father    Heart disease Maternal Uncle  Breast cancer Neg Hx     Allergies  Allergen Reactions   Hydrocodone-Acetaminophen     Other reaction(s): Flushing Other reaction(s): Flushing   Amoxicillin Other (See Comments)    Yeast infection   Metoprolol Tartrate Rash   Oxycodone Itching   Vicodin [Hydrocodone-Acetaminophen] Hives and Rash    Flushing       Latest Ref Rng & Units 10/02/2022   10:44 AM 05/31/2022    7:49 AM 04/21/2022   11:43 AM  CBC  WBC 3.4 - 10.8 x10E3/uL 12.5  9.4  6.9   Hemoglobin 11.1 - 15.9 g/dL 15.3  15.6  14.3   Hematocrit 34.0 - 46.6 % 45.9  48.4  44.3   Platelets 150 - 400 K/uL  207  182       CMP     Component Value Date/Time   NA 141 10/02/2022 1044   K 4.5 10/02/2022 1044   CL 105 10/02/2022 1044   CO2 19 (L) 10/02/2022 1044   GLUCOSE 99 10/02/2022 1044   GLUCOSE 103 (H) 04/21/2022 1143   BUN 14 10/02/2022 1044   CREATININE 0.80 10/02/2022 1044   CREATININE 0.88 05/16/2016 1034   CALCIUM 9.2 10/02/2022 1044   PROT 6.0 10/02/2022 1044   ALBUMIN 4.2 10/02/2022 1044   AST 33 10/02/2022 1044   ALT 41 (H) 10/02/2022 1044   ALKPHOS 83 10/02/2022 1044   BILITOT 0.5 10/02/2022 1044   GFRNONAA >60 04/21/2022 1143   GFRNONAA 66 05/01/2016 0846   GFRAA >60 02/18/2020 0949   GFRAA 76 05/01/2016 0846     VAS [place] ABI WITH/WO TBI  Result Date: 09/04/2022  LOWER EXTREMITY DOPPLER STUDY Patient Name:  Syla Kiniry  Date of Exam:    08/31/2022 Medical Rec #: [id number]             Accession #:    [id number] Date of Birth: May 18, 1951              Patient Gender: F Patient Age:   15 years Exam Location:  [facility] Procedure:      VAS [place] ABI WITH/WO TBI Referring Phys: GREGORY SCHNIER --------------------------------------------------------------------------------  Indications: Rest pain, and peripheral artery disease. High Risk Factors: Hypertension, past history of smoking, coronary artery                    disease.  Vascular Interventions: 02/10/2016 PTA of right femoral to popliteal autogenous                         bypass graft. PTA right distal posterior tibial artery.                         12/2008 and 02/1990 Right femoral to popliteal autogenous                         bypass graft.                          05/30/2019: PTA and Stent placement at 2 locations in                         the Femoral below knee popliteal bypass. Mechanical                         Thrombectomy  Right Popliteal bypass and TibioPeroneal                         trunk and Proximal Peroneal Artery. Infusion of TPA to                         the Right Femoral-Popliteal Bypass.                          05/30/2019: Compartment Syndrome Right Lower Extremity                         Status Post Revascularization.                          06/04/2019: Compartment Syndrome Status Post                         Fasciotomies. Performing Technologist: Delorise Shiner RVT  Examination Guidelines: A complete evaluation includes at minimum, Doppler waveform signals and systolic blood pressure reading at the level of bilateral brachial, anterior tibial, and posterior tibial arteries, when vessel segments are accessible. Bilateral testing is considered an integral part of a complete examination. Photoelectric Plethysmograph (PPG) waveforms and toe systolic pressure readings are included as required and additional duplex testing as needed. Limited examinations for reoccurring  indications may be performed as noted.  ABI Findings: +--------+------------------+-----+--------+--------+ Right   Rt Pressure (mmHg)IndexWaveformComment  +--------+------------------+-----+--------+--------+ [id number]                                     +--------+------------------+-----+--------+--------+ +---------+------------------+-----+----------+-------+ Left     Lt Pressure (mmHg)IndexWaveform  Comment +---------+------------------+-----+----------+-------+ Brachial 123                                      +---------+------------------+-----+----------+-------+ PTA      139               1.13 triphasic         +---------+------------------+-----+----------+-------+ PERO     131               1.08 triphasic         +---------+------------------+-----+----------+-------+ DP       102               0.83 monophasic        +---------+------------------+-----+----------+-------+ Great Toe82                0.67                   +---------+------------------+-----+----------+-------+ +-------+-----------+-----------+------------+------------+ ABI/TBIToday's ABIToday's TBIPrevious ABIPrevious TBI +-------+-----------+-----------+------------+------------+ Right  AKA                   AKA                      +-------+-----------+-----------+------------+------------+ Left   1.13       0.67       1.20        0.80         +-------+-----------+-----------+------------+------------+ Left ABIs appear essentially unchanged compared to prior study on 05/01/2022.  Summary: Left: Resting left ankle-brachial index is within normal  range. The left toe-brachial index is abnormal. *See table(s) above for measurements and observations.  Electronically signed by [name] MD on 09/04/2022 at 4:49:06 PM.    Final        Assessment & Plan:   1. S/P AKA (above knee amputation), right [facility]) The patient has a multitude of blisters on her residual limb.  I  suspect this is due to irritation from wearing her prosthetic for an extended amount of time.  Will cover with Xeroform and gauze to be changed daily.  Will also send in antibiotics for the patient.  She is advised not to utilize her prosthetic while this wound is healing.  Will have her return in 2 to 3 weeks for wound reevaluation.  2. Essential hypertension Continue antihypertensive medications as already ordered, these medications have been reviewed and there are no changes at this time.   Current Outpatient Medications on File Prior to Visit  Medication Sig Dispense Refill   alendronate (FOSAMAX) 70 MG tablet Take 70 mg by mouth once a week. Take with a full glass of water on an empty stomach.     apixaban (ELIQUIS) 2.5 MG TABS tablet Take 2.5 mg by mouth 2 (two) times daily.     atorvastatin (LIPITOR) 40 MG tablet Take 40 mg by mouth at bedtime.      Calcium Carb-Cholecalciferol 600-10 MG-MCG TABS Take 1 tablet by mouth daily.     calcium carbonate (OSCAL) 1500 (600 Ca) MG TABS tablet Take 1,200 mg of elemental calcium by mouth 2 (two) times daily with a meal.     cholecalciferol (VITAMIN D3) 25 MCG (1000 UNIT) tablet Take 1,000 Units by mouth daily.     clonazePAM (KLONOPIN) 0.5 MG tablet Take 0.25-0.5 mg by mouth daily as needed.     digoxin (LANOXIN) 0.125 MG tablet Take 125 mcg by mouth daily.     DULoxetine (CYMBALTA) 60 MG capsule Take 60 mg by mouth 2 (two) times daily.     gabapentin (NEURONTIN) 800 MG tablet Take 1 tablet (800 mg total) by mouth 3 (three) times daily. 90 tablet 5   losartan (COZAAR) 25 MG tablet Take 25 mg by mouth every morning.      omeprazole (PRILOSEC) 40 MG capsule Take 40 mg by mouth daily as needed.     Tocilizumab (ACTEMRA ACTPEN) 162 MG/0.9ML SOAJ Inject into the skin.     triamcinolone (KENALOG) 0.025 % cream Apply 1 Application topically at bedtime.     zolpidem (AMBIEN) 10 MG tablet Take 10 mg by mouth at bedtime as needed.     No current  facility-administered medications on file prior to visit.    There are no Patient Instructions on file for this visit. No follow-ups on file.   Kris Hartmann, NP

## 2022-10-09 ENCOUNTER — Other Ambulatory Visit: Payer: Self-pay

## 2022-10-09 ENCOUNTER — Emergency Department
Admission: EM | Admit: 2022-10-09 | Discharge: 2022-10-09 | Disposition: A | Discharge status: Home / Self Care | Payer: Medicare Other | Attending: Emergency Medicine | Admitting: Emergency Medicine

## 2022-10-09 DIAGNOSIS — R7401 Elevation of levels of liver transaminase levels: Secondary | ICD-10-CM | POA: Insufficient documentation

## 2022-10-09 DIAGNOSIS — R1084 Generalized abdominal pain: Secondary | ICD-10-CM | POA: Diagnosis not present

## 2022-10-09 DIAGNOSIS — I10 Essential (primary) hypertension: Secondary | ICD-10-CM | POA: Insufficient documentation

## 2022-10-09 DIAGNOSIS — R1013 Epigastric pain: Secondary | ICD-10-CM | POA: Diagnosis not present

## 2022-10-09 LAB — COMPREHENSIVE METABOLIC PANEL
ALT: 50 U/L — ABNORMAL HIGH (ref 0–44)
AST: 50 U/L — ABNORMAL HIGH (ref 15–41)
Albumin: 4 g/dL (ref 3.5–5.0)
Alkaline Phosphatase: 65 U/L (ref 38–126)
Anion gap: 7 (ref 5–15)
BUN: 11 mg/dL (ref 8–23)
CO2: 26 mmol/L (ref 22–32)
Calcium: 8.9 mg/dL (ref 8.9–10.3)
Chloride: 106 mmol/L (ref 98–111)
Creatinine, Ser: 0.74 mg/dL (ref 0.44–1.00)
GFR, Estimated: >60 mL/min (ref >60)
Glucose, Bld: 99 mg/dL (ref 70–99)
Potassium: 4.1 mmol/L (ref 3.5–5.1)
Sodium: 139 mmol/L (ref 135–145)
Total Bilirubin: 0.8 mg/dL (ref 0.3–1.2)
Total Protein: 6.7 g/dL (ref 6.5–8.1)

## 2022-10-09 LAB — URINALYSIS, ROUTINE W REFLEX MICROSCOPIC
Bilirubin Urine: NEGATIVE
Glucose, UA: NEGATIVE mg/dL
Hgb urine dipstick: NEGATIVE
Ketones, ur: NEGATIVE mg/dL
Leukocytes,Ua: NEGATIVE
Nitrite: NEGATIVE
Protein, ur: NEGATIVE mg/dL
Specific Gravity, Urine: 1.016 (ref 1.005–1.030)
pH: 6 (ref 5.0–8.0)

## 2022-10-09 LAB — CBC
HCT: 45.6 % (ref 36.0–46.0)
Hemoglobin: 14.8 g/dL (ref 12.0–15.0)
MCH: 31.1 pg (ref 26.0–34.0)
MCHC: 32.5 g/dL (ref 30.0–36.0)
MCV: 95.8 fL (ref 80.0–100.0)
Platelets: 197 10*3/uL (ref 150–400)
RBC: 4.76 MIL/uL (ref 3.87–5.11)
RDW: 13.6 % (ref 11.5–15.5)
WBC: 8.1 10*3/uL (ref 4.0–10.5)
nRBC: 0 % (ref 0.0–0.2)

## 2022-10-09 LAB — LIPASE, BLOOD: Lipase: 48 U/L (ref 11–51)

## 2022-10-09 MED ORDER — ALUM & MAG HYDROXIDE-SIMETH 200-200-20 MG/5ML PO SUSP
30.0000 mL | Freq: Once | ORAL | Status: AC
  Administered 2022-10-09: 30 mL via ORAL
  Filled 2022-10-09: qty 30

## 2022-10-09 MED ORDER — FAMOTIDINE 20 MG PO TABS
40.0000 mg | ORAL_TABLET | Freq: Once | ORAL | Status: AC
  Administered 2022-10-09: 40 mg via ORAL
  Filled 2022-10-09: qty 2

## 2022-10-09 NOTE — ED Triage Notes (Signed)
Abdominal pain started today @1230$ , c/o burning sensation BP 167/82 O2 93% HR 80

## 2022-10-09 NOTE — ED Triage Notes (Signed)
Patient was in courtroom today but states she began to feel burning sensation in mid abdomen and her arms started shaking, thinks she may have been having panic attack. Reports history of the same with burning sensation and usually takes tums to resolve. NAD noted at this time, patient states still feels burning sensation.

## 2022-10-09 NOTE — ED Provider Notes (Signed)
[facility] Provider Note  Patient Contact: 5:49 PM (approximate)   History   Abdominal Pain   HPI  Sara [name] is a 72 y.o. female with a history of GERD, depression, hyperlipidemia and hypertension, presents to the emergency department with epigastric abdominal pain that started while patient was at court.  Patient states that one of her friends was suing her and she developed discomfort and upper extremity shakiness.  She states that the pain has improved but she still feels lingering discomfort.  She has had to take omeprazole in the past but has not taken any recently.  No nausea or vomiting at home.  No fever.      Physical Exam   Triage Vital Signs: ED Triage Vitals  Enc Vitals Group     BP 10/09/22 1414 (!) 154/83     Pulse Rate 10/09/22 1414 89     Resp 10/09/22 1414 16     Temp 10/09/22 1414 97.9 F (36.6 C)     Temp Source 10/09/22 1414 Oral     SpO2 10/09/22 1414 99 %     Weight 10/09/22 1415 127 lb (57.6 kg)     Height 10/09/22 1415 5' 3"$  (1.6 m)     Head Circumference --      Peak Flow --      Pain Score 10/09/22 1415 2     Pain Loc --      Pain Edu? --      Excl. in [place]? --     Most recent vital signs: Vitals:   10/09/22 1414 10/09/22 1819  BP: (!) 154/83 (!) 142/84  Pulse: 89 83  Resp: 16 15  Temp: 97.9 F (36.6 C)   SpO2: 99% 92%     General: Alert and in no acute distress. Eyes:  PERRL. EOMI. Head: No acute traumatic findings ENT:      Nose: No congestion/rhinnorhea.      Mouth/Throat: Mucous membranes are moist. Neck: No stridor. No cervical spine tenderness to palpation. Cardiovascular:  Good peripheral perfusion Respiratory: Normal respiratory effort without tachypnea or retractions. Lungs CTAB. Good air entry to the bases with no decreased or absent breath sounds. Gastrointestinal: Bowel sounds 4 quadrants. Soft and nontender to palpation. No guarding or rigidity. No palpable masses. No distention. No  CVA tenderness. Musculoskeletal: Full range of motion to all extremities.  Neurologic:  No gross focal neurologic deficits are appreciated.  Skin:   No rash noted Other:   ED Results / Procedures / Treatments   Labs (all labs ordered are listed, but only abnormal results are displayed) Labs Reviewed  COMPREHENSIVE METABOLIC PANEL - Abnormal; Notable for the following components:      Result Value   AST 50 (*)    ALT 50 (*)    All other components within normal limits  URINALYSIS, ROUTINE W REFLEX MICROSCOPIC - Abnormal; Notable for the following components:   Color, Urine YELLOW (*)    APPearance HAZY (*)    All other components within normal limits  LIPASE, BLOOD  CBC        PROCEDURES:  Critical Care performed: No  Procedures   MEDICATIONS ORDERED IN ED: Medications  alum & mag hydroxide-simeth (MAALOX/MYLANTA) 200-200-20 MG/5ML suspension 30 mL (30 mLs Oral Given 10/09/22 1751)  famotidine (PEPCID) tablet 40 mg (40 mg Oral Given 10/09/22 1750)     IMPRESSION / MDM / ASSESSMENT AND PLAN / ED COURSE  I reviewed the triage vital signs and  the nursing notes.                              Assessment and plan Abdominal pain 72 year old female presents to the emergency department with epigastric abdominal pain that started today with history of GERD.  Patient was hypertensive at triage but vital signs otherwise reassuring.  On exam, patient alert and nontoxic-appearing.  Abdomen is soft and nontender.  CBC reassuring.  CMP shows mild elevation in AST and ALT.  Urinalysis shows no UTI.  Lipase within range.  Patient felt improved after GI cocktail and requested discharge.  Return precautions were given to return with new or worsening symptoms.   FINAL CLINICAL IMPRESSION(S) / ED DIAGNOSES   Final diagnoses:  Epigastric pain     Rx / DC Orders   ED Discharge Orders     None        Note:  This document was prepared using Dragon voice recognition  software and may include unintentional dictation errors.   Vallarie Mare [place], PA-C 10/09/22 2346    Arta Silence, MD 10/10/22 0021

## 2022-10-10 ENCOUNTER — Telehealth: Payer: Self-pay | Admitting: *Deleted

## 2022-10-10 NOTE — Telephone Encounter (Signed)
Received fax [facility].  Patient is clear to have procedure. Pateint should stop taking Eliquis 2.5 mg 3 day prior and restart 1 day after.

## 2022-10-13 ENCOUNTER — Telehealth: Payer: Self-pay

## 2022-10-13 NOTE — Telephone Encounter (Signed)
     Patient  visit on [facility]   at 2/19   Have you been able to follow up with your primary care physician? Yes   The patient was or was not able to obtain any needed medicine or equipment. Yes   Are there diet recommendations that you are having difficulty following? Na   Patient expresses understanding of discharge instructions and education provided has no other needs at this time.  Yes     [facility] [phone] [address], [place], [place] [address] Phone: [phone] Email: [email or web address]

## 2022-10-16 NOTE — Telephone Encounter (Signed)
Colonoscopy schedule on Monday 11/13/2022

## 2022-10-18 ENCOUNTER — Ambulatory Visit
Admission: RE | Admit: 2022-10-18 | Discharge: 2022-10-18 | Disposition: A | Discharge status: Home / Self Care | Payer: Medicare Other | Source: Ambulatory Visit | Attending: Student in an Organized Health Care Education/Training Program | Admitting: Student in an Organized Health Care Education/Training Program

## 2022-10-18 ENCOUNTER — Ambulatory Visit
Payer: Medicare Other | Attending: Student in an Organized Health Care Education/Training Program | Admitting: Student in an Organized Health Care Education/Training Program

## 2022-10-18 ENCOUNTER — Encounter: Payer: Self-pay | Admitting: Student in an Organized Health Care Education/Training Program

## 2022-10-18 DIAGNOSIS — Z9889 Other specified postprocedural states: Secondary | ICD-10-CM | POA: Diagnosis not present

## 2022-10-18 DIAGNOSIS — G894 Chronic pain syndrome: Secondary | ICD-10-CM | POA: Diagnosis not present

## 2022-10-18 DIAGNOSIS — M12811 Other specific arthropathies, not elsewhere classified, right shoulder: Secondary | ICD-10-CM | POA: Diagnosis not present

## 2022-10-18 MED ORDER — LIDOCAINE HCL 2 % IJ SOLN
INTRAMUSCULAR | Status: AC
  Filled 2022-10-18: qty 20

## 2022-10-18 MED ORDER — DEXAMETHASONE SODIUM PHOSPHATE 10 MG/ML IJ SOLN
INTRAMUSCULAR | Status: AC
  Filled 2022-10-18: qty 1

## 2022-10-18 MED ORDER — LIDOCAINE HCL 2 % IJ SOLN
20.0000 mL | Freq: Once | INTRAMUSCULAR | Status: AC
  Administered 2022-10-18: 400 mg

## 2022-10-18 MED ORDER — ROPIVACAINE HCL 2 MG/ML IJ SOLN
INTRAMUSCULAR | Status: AC
  Filled 2022-10-18: qty 20

## 2022-10-18 MED ORDER — DEXAMETHASONE SODIUM PHOSPHATE 10 MG/ML IJ SOLN
10.0000 mg | Freq: Once | INTRAMUSCULAR | Status: AC
  Administered 2022-10-18: 10 mg

## 2022-10-18 MED ORDER — ROPIVACAINE HCL 2 MG/ML IJ SOLN
4.0000 mL | Freq: Once | INTRAMUSCULAR | Status: AC
  Administered 2022-10-18: 4 mL via PERINEURAL

## 2022-10-18 NOTE — Progress Notes (Signed)
Safety precautions to be maintained throughout the outpatient stay will include: orient to surroundings, keep bed in low position, maintain call bell within reach at all times, provide assistance with transfer out of bed and ambulation.  

## 2022-10-18 NOTE — Progress Notes (Signed)
PROVIDER NOTE: Interpretation of information contained herein should be left to medically-trained personnel. Specific patient instructions are provided elsewhere under "Patient Instructions" section of medical record. This document was created in part using STT-dictation technology, any transcriptional errors that may result from this process are unintentional.  Patient: Sara [name] Type: Established DOB: 03/16/1951 MRN: [id number] PCP: Evern Bio, NP  Service: Procedure DOS: 10/18/2022 Setting: Ambulatory Location: Ambulatory outpatient facility Delivery: Face-to-face Provider: Gillis Santa, MD Specialty: Interventional Pain Management Specialty designation: 09 Location: Outpatient facility Ref. Prov.: Gillis Santa, MD       Interventional Therapy   Procedure: Suprascapular nerve block (SSNB) #1  Laterality:  Right  Level: Superior to scapular spine, lateral to supraspinatus fossa (Suprascapular notch).  Imaging: Fluoroscopic guidance         Anesthesia: Local anesthesia (1-2% Lidocaine) DOS: 10/18/2022  Performed by: Gillis Santa, MD  Purpose: Diagnostic/Therapeutic Indications: Shoulder pain, severe enough to impact quality of life and/or function. 1. Rotator cuff arthropathy of right shoulder   2. Hx of rotator cuff surgery   3. Chronic pain syndrome    NAS-11 score:   Pre-procedure: 0-No pain/10   Post-procedure: 0-No pain/10     Target: Suprascapular nerve Location: midway between the medial border of the scapula and the acromion as it runs through the suprascapular notch. Region: Suprascapular, posterior shoulder  Approach: Percutaneous  Neuroanatomy: The suprascapular nerve is the lateral branch of the superior trunk of the brachial plexus. It receives nerve fibers that originate in the nerve roots C5 and C6 (and sometimes C4). It is a mixed nerve, meaning that it provides both sensory and motor supply for the suprascapular region. Function: The main  function of this nerve is to provide motor innervation for two muscles, the supraspinatus and infraspinatus muscles. They are part of the rotator cuff muscles. In addition, the suprascapular nerve provides a sensory supply to the joints of the scapula (glenohumeral and acromioclavicular joints). Rationale (medical necessity): procedure needed and proper for the diagnosis and/or treatment of the patient's medical symptoms and needs.  Position / Prep / Materials:  Position: Prone Materials:  Tray: Block Needle(s):  Type: Spinal  Gauge (G): 22  Length: 3.5 in.  Qty: 1 Prep solution: DuraPrep (Iodine Povacrylex [0.7% available iodine] and Isopropyl Alcohol, 74% w/w) Prep Area: Entire posterior shoulder area. From upper spine to shoulder proper (upper arm), and from lateral neck to lower tip of shoulder blade.   Pre-op H&P Assessment:  Sara [name] is a 72 y.o. (year old), female patient, seen today for interventional treatment. She  has a past surgical history that includes Vascular surgery [id number]); Hemorroidectomy (2014); Esophagogastroduodenoscopy (N/A, 12/21/2014); Esophagogastroduodenoscopy (egd) with propofol (N/A, 02/08/2016); Cardiac catheterization (N/A, 02/09/2016); Cardiac catheterization (Right, 02/10/2016); Cardiac catheterization (02/15/2017); Colonoscopy with propofol (N/A, 10/22/2017); Colonoscopy with propofol (N/A, 11/05/2017); polypectomy (11/05/2017); Tonsillectomy; Adenoidectomy; Lower Extremity Angiography (Right, 05/30/2019); Fasciotomy closure (Right, 06/04/2019); Artery Biopsy (Right, 07/14/2019); Fasciotomy (Right, 05/30/2019); Breast cyst aspiration (Left); Shoulder arthroscopy with rotator cuff repair and subacromial decompression (Right, 02/27/2020); Lower Extremity Angiography (Right, 07/02/2020); and Amputation (Right, 08/11/2020). Sara [name] has a current medication list which includes the following prescription(s): apixaban, atorvastatin, calcium carb-cholecalciferol, calcium  carbonate, cholecalciferol, digoxin, doxycycline, duloxetine, gabapentin, losartan, omeprazole, actemra actpen, triamcinolone, zolpidem, alendronate, and clonazepam. Her primarily concern today is the Pain (Right suprascap)  Initial Vital Signs:  Pulse/HCG Rate: 97  Temp: (!) 97.5 F (36.4 C) Resp: 17 BP: 129/71 SpO2: 97 %  BMI: Estimated body mass index is 22.67 kg/m as  calculated from the following:   Height as of this encounter: 5' 3"$  (1.6 m).   Weight as of this encounter: 128 lb (58.1 kg).  Risk Assessment: Allergies: Reviewed. She is allergic to hydrocodone-acetaminophen, amoxicillin, metoprolol tartrate, oxycodone, and vicodin [hydrocodone-acetaminophen].  Allergy Precautions: None required Coagulopathies: Reviewed. None identified.  Blood-thinner therapy: None at this time Active Infection(s): Reviewed. None identified. Sara [name] is afebrile  Site Confirmation: Sara [name] was asked to confirm the procedure and laterality before marking the site Procedure checklist: Completed Consent: Before the procedure and under the influence of no sedative(s), amnesic(s), or anxiolytics, the patient was informed of the treatment options, risks and possible complications. To fulfill our ethical and legal obligations, as recommended by the American Medical Association's Code of Ethics, I have informed the patient of my clinical impression; the nature and purpose of the treatment or procedure; the risks, benefits, and possible complications of the intervention; the alternatives, including doing nothing; the risk(s) and benefit(s) of the alternative treatment(s) or procedure(s); and the risk(s) and benefit(s) of doing nothing. The patient was provided information about the general risks and possible complications associated with the procedure. These may include, but are not limited to: failure to achieve desired goals, infection, bleeding, organ or nerve damage, allergic reactions, paralysis, and  death. In addition, the patient was informed of those risks and complications associated to the procedure, such as failure to decrease pain; infection; bleeding; organ or nerve damage with subsequent damage to sensory, motor, and/or autonomic systems, resulting in permanent pain, numbness, and/or weakness of one or several areas of the body; allergic reactions; (i.e.: anaphylactic reaction); and/or death. Furthermore, the patient was informed of those risks and complications associated with the medications. These include, but are not limited to: allergic reactions (i.e.: anaphylactic or anaphylactoid reaction(s)); adrenal axis suppression; blood sugar elevation that in diabetics may result in ketoacidosis or comma; water retention that in patients with history of congestive heart failure may result in shortness of breath, pulmonary edema, and decompensation with resultant heart failure; weight gain; swelling or edema; medication-induced neural toxicity; particulate matter embolism and blood vessel occlusion with resultant organ, and/or nervous system infarction; and/or aseptic necrosis of one or more joints. Finally, the patient was informed that Medicine is not an exact science; therefore, there is also the possibility of unforeseen or unpredictable risks and/or possible complications that may result in a catastrophic outcome. The patient indicated having understood very clearly. We have given the patient no guarantees and we have made no promises. Enough time was given to the patient to ask questions, all of which were answered to the patient's satisfaction. Sara [name] has indicated that she wanted to continue with the procedure. Attestation: I, the ordering provider, attest that I have discussed with the patient the benefits, risks, side-effects, alternatives, likelihood of achieving goals, and potential problems during recovery for the procedure that I have provided informed consent. Date  Time: 10/18/2022  10:01 AM  Pre-Procedure Preparation:  Monitoring: As per clinic protocol. Respiration, ETCO2, SpO2, BP, heart rate and rhythm monitor placed and checked for adequate function Safety Precautions: Patient was assessed for positional comfort and pressure points before starting the procedure. Time-out: I initiated and conducted the "Time-out" before starting the procedure, as per protocol. The patient was asked to participate by confirming the accuracy of the "Time Out" information. Verification of the correct person, site, and procedure were performed and confirmed by me, the nursing staff, and the patient. "Time-out" conducted as per Joint Commission's Universal  Protocol (UP.01.01.01). Time: 1021  Description of Procedure:          Procedural Technique Safety Precautions: Aspiration looking for blood return was conducted prior to all injections. At no point did we inject any substances, as a needle was being advanced. No attempts were made at seeking any paresthesias. Safe injection practices and needle disposal techniques used. Medications properly checked for expiration dates. SDV (single dose vial) medications used. Description of the Procedure: Protocol guidelines were followed. The patient was placed in position over the procedure table. The target area was identified and the area prepped in the usual manner. Skin & deeper tissues infiltrated with local anesthetic. Appropriate amount of time allowed to pass for local anesthetics to take effect. The procedure needles were then advanced to the target area. Proper needle placement secured. Negative aspiration confirmed. Solution injected in intermittent fashion, asking for systemic symptoms every 0.5cc of injectate. The needles were then removed and the area cleansed, making sure to leave some of the prepping solution back to take advantage of its long term bactericidal properties.  5 cc solution made of 4 cc of 0.2% ropivacaine, 1 cc of Decadron 10  mg/cc.    Vitals:   10/18/22 1002 10/18/22 1020 10/18/22 1025 10/18/22 1030  BP: 129/71 (!) 140/98 (!) 144/90 118/89  Pulse: 97 (!) 102 96 95  Resp: 17 20 (!) 22   Temp: (!) 97.5 F (36.4 C)     TempSrc: Temporal     SpO2: 97% 100% 97%   Weight: 128 lb (58.1 kg)     Height: 5' 3"$  (1.6 m)        Start Time: 1021 hrs. End Time: 1029 hrs.  Imaging Guidance (Spinal):          Type of Imaging Technique: Fluoroscopy Guidance (Spinal) Indication(s): Assistance in needle guidance and placement for procedures requiring needle placement in or near specific anatomical locations not easily accessible without such assistance. Exposure Time: Please see nurses notes. Contrast: None used. Fluoroscopic Guidance: I was personally present during the use of fluoroscopy. "Tunnel Vision Technique" used to obtain the best possible view of the target area. Parallax error corrected before commencing the procedure. "Direction-depth-direction" technique used to introduce the needle under continuous pulsed fluoroscopy. Once target was reached, antero-posterior, oblique, and lateral fluoroscopic projection used confirm needle placement in all planes. Images permanently stored in EMR. Interpretation: No contrast injected. I personally interpreted the imaging intraoperatively. Adequate needle placement confirmed in multiple planes. Permanent images saved into the patient's record.  Antibiotic Prophylaxis:   Anti-infectives (From admission, onward)    None      Indication(s): None identified  Post-operative Assessment:  Post-procedure Vital Signs:  Pulse/HCG Rate: 95  Temp: (!) 97.5 F (36.4 C) Resp: (!) 22 BP: 118/89 SpO2: 97 %  EBL: None  Complications: No immediate post-treatment complications observed by team, or reported by patient.  Note: The patient tolerated the entire procedure well. A repeat set of vitals were taken after the procedure and the patient was kept under observation following  institutional policy, for this type of procedure. Post-procedural neurological assessment was performed, showing return to baseline, prior to discharge. The patient was provided with post-procedure discharge instructions, including a section on how to identify potential problems. Should any problems arise concerning this procedure, the patient was given instructions to immediately contact us, at any time, without hesitation. In any case, we plan to contact the patient by telephone for a follow-up status report regarding this interventional procedure.  Comments:  No additional relevant information.  Plan of Care (POC)  Orders:  Orders Placed This Encounter  Procedures   DG PAIN CLINIC C-ARM 1-60 MIN NO REPORT    Intraoperative interpretation by procedural physician at [place].    Standing Status:   Standing    Number of Occurrences:   1    Order Specific Question:   Reason for exam:    Answer:   Assistance in needle guidance and placement for procedures requiring needle placement in or near specific anatomical locations not easily accessible without such assistance.     Medications ordered for procedure: Meds ordered this encounter  Medications   lidocaine (XYLOCAINE) 2 % (with pres) injection 400 mg   ropivacaine (PF) 2 mg/mL (0.2%) (NAROPIN) injection 4 mL   dexamethasone (DECADRON) injection 10 mg   Medications administered: We administered lidocaine, ropivacaine (PF) 2 mg/mL (0.2%), and dexamethasone.  See the medical record for exact dosing, route, and time of administration.  Follow-up plan:   Return in about 4 weeks (around 11/15/2022) for Post Procedure Evaluation, virtual.       1.  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  Discussed treatment options which include titration of gabapentin to a more therapeutic dose.  Can also consider trial of Lyrica in future.  2.  Consider lidocaine infusion for right lower extremity phantom limb pain.   QTc checked from EKG in November 2022 and is appropriate.  Will need updated EKG if we are considering lidocaine infusion.  3.  Consider spinal cord stimulation for phantom limb pain.  Informed patient that there is mixed evidence on this but this could be a treatment option.       Recent Visits Date Type Provider Dept  10/02/22 Office Visit Gillis Santa, MD [facility]  09/04/22 Procedure visit Gillis Santa, MD [facility]  08/17/22 Office Visit Gillis Santa, MD [facility]  Showing recent visits within past 90 days and meeting all other requirements Today's Visits Date Type Provider Dept  10/18/22 Procedure visit Gillis Santa, MD [facility]  Showing today's visits and meeting all other requirements Future Appointments Date Type Provider Dept  11/15/22 Appointment Gillis Santa, MD [facility]  Showing future appointments within next 90 days and meeting all other requirements  Disposition: Discharge home  Discharge (Date  Time): 10/18/2022; 1036 hrs.   Primary Care Physician: Evern Bio, NP Location: [facility] Note by: Gillis Santa, MD (TTS technology used. I apologize for any typographical errors that were not detected and corrected.) Date: 10/18/2022; Time: 10:44 AM  Disclaimer:  Medicine is not an Chief Strategy Officer. The only guarantee in medicine is that nothing is guaranteed. It is important to note that the decision to proceed with this intervention was based on the information collected from the patient. The Data and conclusions were drawn from the patient's questionnaire, the interview, and the physical examination. Because the information was provided in large part by the patient, it cannot be guaranteed that it has not been purposely or unconsciously manipulated. Every effort has been made to obtain as much relevant data as possible for this evaluation. It is important to note that the  conclusions that lead to this procedure are derived in large part from the available data. Always take into account that the treatment will also be dependent on availability of resources and existing treatment guidelines, considered by other Pain Management Practitioners as being common knowledge and practice, at  the time of the intervention. For Medico-Legal purposes, it is also important to point out that variation in procedural techniques and pharmacological choices are the acceptable norm. The indications, contraindications, technique, and results of the above procedure should only be interpreted and judged by a Board-Certified Interventional Pain Specialist with extensive familiarity and expertise in the same exact procedure and technique.

## 2022-10-18 NOTE — Patient Instructions (Signed)

## 2022-10-19 ENCOUNTER — Other Ambulatory Visit: Payer: Self-pay | Admitting: Nurse Practitioner

## 2022-10-19 NOTE — Telephone Encounter (Signed)
Called PP. Denies any needs at this time.

## 2022-10-20 ENCOUNTER — Encounter (INDEPENDENT_AMBULATORY_CARE_PROVIDER_SITE_OTHER): Payer: Self-pay | Admitting: Nurse Practitioner

## 2022-10-20 ENCOUNTER — Ambulatory Visit (INDEPENDENT_AMBULATORY_CARE_PROVIDER_SITE_OTHER): Payer: Medicare Other | Admitting: Nurse Practitioner

## 2022-10-20 VITALS — BP 113/66 | HR 84 | Resp 16 | Ht 63.0 in | Wt 128.0 lb

## 2022-10-20 DIAGNOSIS — I10 Essential (primary) hypertension: Secondary | ICD-10-CM | POA: Diagnosis not present

## 2022-10-20 DIAGNOSIS — Z79899 Other long term (current) drug therapy: Secondary | ICD-10-CM | POA: Diagnosis not present

## 2022-10-20 DIAGNOSIS — Z89611 Acquired absence of right leg above knee: Secondary | ICD-10-CM

## 2022-10-20 DIAGNOSIS — R7989 Other specified abnormal findings of blood chemistry: Secondary | ICD-10-CM | POA: Diagnosis not present

## 2022-10-20 DIAGNOSIS — M316 Other giant cell arteritis: Secondary | ICD-10-CM | POA: Diagnosis not present

## 2022-10-20 MED ORDER — GABAPENTIN 800 MG PO TABS: 800.0000 mg | ORAL_TABLET | Freq: Three times a day (TID) | ORAL | 0 refills | Status: DC

## 2022-10-23 ENCOUNTER — Ambulatory Visit (INDEPENDENT_AMBULATORY_CARE_PROVIDER_SITE_OTHER): Payer: Medicare Other | Admitting: Vascular Surgery

## 2022-10-24 ENCOUNTER — Encounter: Payer: Self-pay | Admitting: Cardiovascular Disease

## 2022-10-24 ENCOUNTER — Ambulatory Visit (INDEPENDENT_AMBULATORY_CARE_PROVIDER_SITE_OTHER): Payer: Self-pay

## 2022-10-24 ENCOUNTER — Ambulatory Visit (INDEPENDENT_AMBULATORY_CARE_PROVIDER_SITE_OTHER): Payer: Self-pay | Admitting: Cardiovascular Disease

## 2022-10-24 VITALS — BP 110/66 | HR 106 | Ht 63.0 in | Wt 136.4 lb

## 2022-10-24 DIAGNOSIS — R000 Tachycardia, unspecified: Secondary | ICD-10-CM | POA: Insufficient documentation

## 2022-10-24 DIAGNOSIS — I6523 Occlusion and stenosis of bilateral carotid arteries: Secondary | ICD-10-CM

## 2022-10-24 DIAGNOSIS — I739 Peripheral vascular disease, unspecified: Secondary | ICD-10-CM | POA: Diagnosis not present

## 2022-10-24 DIAGNOSIS — I2510 Atherosclerotic heart disease of native coronary artery without angina pectoris: Secondary | ICD-10-CM | POA: Diagnosis not present

## 2022-10-24 DIAGNOSIS — I4711 Inappropriate sinus tachycardia, so stated: Secondary | ICD-10-CM

## 2022-10-24 DIAGNOSIS — M810 Age-related osteoporosis without current pathological fracture: Secondary | ICD-10-CM

## 2022-10-24 DIAGNOSIS — I70213 Atherosclerosis of native arteries of extremities with intermittent claudication, bilateral legs: Secondary | ICD-10-CM

## 2022-10-24 MED ORDER — IVABRADINE HCL 7.5 MG PO TABS: 7.5000 mg | ORAL_TABLET | Freq: Two times a day (BID) | ORAL | 3 refills | Status: DC

## 2022-10-24 NOTE — Assessment & Plan Note (Signed)
Patient doing well. No complaints. Heart rate continues to be elevated. Developed a rash/blisters from metoprolol. Diltiazem caused headaches. Will attempt to get Corlan[place] approved. Patient now going to the [place]. Patient will notify office if [facility] will not [facility] Corlan[place].

## 2022-10-24 NOTE — Progress Notes (Deleted)
Cardiology Office Note   Date:  10/24/2022   ID:  Densie [place], [place] 11-May-1951, MRN [id number]  PCP:  Evern Bio, NP  Cardiologist:  Neoma Laming, MD      History of Present Illness: Sara [name] is a 72 y.o. 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 Delana Meyer   Vertigo    Last episode approx Aug 2015   Wears dentures    full upper     Past Surgical History:  Procedure Laterality Date   ADENOIDECTOMY     AMPUTATION Right 08/11/2020   Procedure: AMPUTATION ABOVE KNEE;  Surgeon: [name], MD;  Location: [facility];  Service: Vascular;  Laterality: Right;   ARTERY BIOPSY Right 07/14/2019   Procedure: BIOPSY TEMPORAL ARTERY;  Surgeon: [name], MD;  Location: [facility];  Service: Vascular;  Laterality: Right;   BREAST CYST ASPIRATION Left    CARDIAC CATHETERIZATION  02/15/2017   [facility]   COLONOSCOPY WITH PROPOFOL N/A 10/22/2017   Procedure: COLONOSCOPY WITH PROPOFOL;  Surgeon: Lucilla Lame, MD;  Location: [place];  Service: Endoscopy;  Laterality: N/A;  specimens not taken--pt on Plavix will be brought back in after 7 days off med   COLONOSCOPY WITH PROPOFOL N/A 11/05/2017   Procedure: COLONOSCOPY WITH PROPOFOL;  Surgeon: Lucilla Lame, MD;  Location: [place];  Service: Endoscopy;  Laterality: N/A;   ESOPHAGOGASTRODUODENOSCOPY N/A 12/21/2014   Procedure: ESOPHAGOGASTRODUODENOSCOPY (EGD);  Surgeon: Lucilla Lame, MD;  Location: [place];  Service:  Gastroenterology;  Laterality: N/A;   ESOPHAGOGASTRODUODENOSCOPY (EGD) WITH PROPOFOL N/A 02/08/2016   Procedure: ESOPHAGOGASTRODUODENOSCOPY (EGD) WITH PROPOFOL;  Surgeon: Lucilla Lame, MD;  Location: [facility];  Service: Endoscopy;  Laterality: N/A;   FASCIOTOMY Right 05/30/2019   Procedure: FASCIOTOMY;  Surgeon: [name], MD;  Location: [facility];  Service: Vascular;  Laterality: Right;   FASCIOTOMY CLOSURE Right 06/04/2019   Procedure: FASCIOTOMY CLOSURE;  Surgeon: [name], MD;  Location: [facility];  Service: Vascular;  Laterality: Right;   HEMORROIDECTOMY  2014   LOWER EXTREMITY ANGIOGRAPHY Right 05/30/2019   Procedure: LOWER EXTREMITY ANGIOGRAPHY;  Surgeon: [name], MD;  Location: [place] [facility];  Service: Cardiovascular;  Laterality: Right;   LOWER EXTREMITY ANGIOGRAPHY Right 07/02/2020   Procedure: LOWER EXTREMITY ANGIOGRAPHY;  Surgeon: [name], MD;  Location: [place] [facility];  Service: Cardiovascular;  Laterality: Right;   PERIPHERAL VASCULAR CATHETERIZATION N/A 02/09/2016   Procedure: Abdominal Aortogram w/Lower Extremity;  Surgeon: [name], MD;  Location: [place] [facility];  Service: Cardiovascular;  Laterality: N/A;   PERIPHERAL VASCULAR CATHETERIZATION Right 02/10/2016   Procedure: Lower Extremity Angiography;  Surgeon: [name], MD;  Location: [place] [facility];  Service: Cardiovascular;  Laterality: Right;   POLYPECTOMY  11/05/2017   Procedure: POLYPECTOMY;  Surgeon: Lucilla Lame, MD;  Location: [facility];  Service: Endoscopy;;   SHOULDER ARTHROSCOPY WITH ROTATOR CUFF REPAIR AND SUBACROMIAL DECOMPRESSION Right 02/27/2020   Procedure: RIGHT SHOULDER ARTHROSCOPY SUBACROMIAL DECOMPRESSION, DISTAL CLAVICLE EXCISION AND MINI-OPEN ROTATOR CUFF REPAIR;  Surgeon: Thornton Park, MD;  Location: [facility];  Service: Orthopedics;  Laterality: Right;   TONSILLECTOMY     VASCULAR SURGERY  [id number]   Fem-Pop  Bypass     Current Outpatient Medications  Medication Sig Dispense Refill   apixaban (ELIQUIS) 2.5 MG TABS tablet Take 2.5 mg by mouth 2 (two) times daily.     atorvastatin (LIPITOR) 40 MG tablet Take 40 mg by mouth at bedtime.      Calcium Carb-Cholecalciferol 600-10 MG-MCG TABS Take 1 tablet by mouth daily.     clonazePAM (KLONOPIN) 0.5 MG tablet Take 0.25-0.5 mg by mouth daily as needed.     digoxin (LANOXIN) 0.125 MG tablet Take 125 mcg by mouth daily.     DULoxetine (CYMBALTA) 60 MG capsule Take 60 mg by mouth 2 (two) times daily.     gabapentin (NEURONTIN) 800 MG tablet Take 1 tablet (800 mg total) by mouth 3 (three) times daily. 90 tablet 0   losartan (COZAAR) 25 MG tablet Take 25 mg by mouth every morning.      omeprazole (PRILOSEC) 40 MG capsule Take 40 mg by mouth daily as needed.     Tocilizumab (ACTEMRA ACTPEN) 162 MG/0.9ML SOAJ Inject into the skin.     triamcinolone (KENALOG) 0.025 % cream Apply 1 Application topically at bedtime.     zolpidem (AMBIEN) 10 MG tablet TAKE 1 TABLET BY MOUTH NIGHTLY AT BEDTIME AS NEEDED FOR INSOMNIA 30 tablet 1   No current facility-administered medications for this visit.    Allergies:   Hydrocodone-acetaminophen, Amoxicillin, Diltiazem, Metoprolol tartrate, Oxycodone, and Vicodin [hydrocodone-acetaminophen]    Social History:   reports that she quit smoking about 31 years ago. Her smoking use included cigarettes. She has a 50.00 pack-year smoking history. She has never used smokeless tobacco. She reports that she does not drink alcohol and does not use drugs.   Family History:  family history includes CVA in her mother; Cancer in her father; Colon cancer in her father; Heart attack in her mother; Heart disease in her maternal uncle.    ROS:     ROS    All other systems are reviewed and negative.    PHYSICAL EXAM: VS:  BP 110/66   Pulse (!) 106   Ht '5\' 3"'$  (1.6 m)   Wt 136 lb 6.4 oz (61.9 kg)   SpO2 96%   BMI 24.16 kg/m  , BMI  Body mass index is 24.16 kg/m. Last weight:  Wt Readings from Last 3 Encounters:  10/24/22 136 lb 6.4 oz (61.9 kg)  10/20/22 128 lb (58.1 kg)  10/18/22 128 lb (58.1 kg)     Physical Exam    EKG:   Recent Labs: 10/09/2022: ALT 50; BUN 11; Creatinine, Ser 0.74; Hemoglobin 14.8; Platelets 197; Potassium 4.1; Sodium 139    Lipid Panel    Component Value Date/Time   CHOL 117 10/02/2022 1044   TRIG 79 10/02/2022 1044   HDL 55 10/02/2022 1044   CHOLHDL 2.1 10/02/2022 1044   LDLCALC 46 10/02/2022 1044      Other studies Reviewed: Additional studies/ records that were reviewed today include:  Review of the above records demonstrates:       No data to display            ASSESSMENT  AND PLAN:    ICD-10-CM   1. Sinus tachycardia  R00.0     2. Angiopathy, 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HCC)  I73.9     3. Atherosclerosis of native artery of both lower extremities with intermittent claudication ([place])  I70.213     4. Coronary artery disease involving native coronary artery of native heart without angina pectoris  I25.10     5. Bilateral carotid artery stenosis  I65.23        Problem List Items Addressed This Visit       Cardiovascular and Mediastinum   Angiopathy, 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[place])   Carotid artery stenosis   CAD (coronary artery disease)   Atherosclerosis of native arteries of extremity with intermittent claudication (HCC)     Other   Sinus tachycardia - Primary       Disposition:   No follow-ups on file.    Total time spent: {AMA time spent:29001} minutes  Signed,  Neoma Laming, MD  10/24/2022 1:49 PM    [facility]

## 2022-10-24 NOTE — Progress Notes (Signed)
Cardiology Office Note   Date:  10/24/2022   ID:  Sara [name], [place] 06/02/1951, MRN [id number]  PCP:  Evern Bio, NP  Cardiologist:  Neoma Laming, MD      History of Present Illness: Sara [name] is a 72 y.o. female who presents for  Chief Complaint  Patient presents with   Follow-up    4 month follow up    Patient in office for routine cardiac exam. Denies chest pain, shortness of breath, edema, palpitations.      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 Delana Meyer   Vertigo    Last episode approx Aug 2015   Wears dentures    full upper     Past Surgical History:  Procedure Laterality Date   ADENOIDECTOMY     AMPUTATION Right 08/11/2020   Procedure: AMPUTATION ABOVE KNEE;  Surgeon: [name], MD;  Location: [facility];  Service: Vascular;  Laterality: Right;   ARTERY BIOPSY Right 07/14/2019   Procedure: BIOPSY TEMPORAL ARTERY;  Surgeon: [name], MD;  Location: [facility];  Service: Vascular;  Laterality: Right;   BREAST CYST ASPIRATION Left    CARDIAC CATHETERIZATION  02/15/2017   [facility]   COLONOSCOPY WITH PROPOFOL N/A 10/22/2017   Procedure: COLONOSCOPY WITH PROPOFOL;  Surgeon: Lucilla Lame, MD;  Location: [place];  Service: Endoscopy;  Laterality: N/A;  specimens not taken--pt on Plavix will be brought back in after 7 days off med   COLONOSCOPY WITH PROPOFOL N/A 11/05/2017   Procedure: COLONOSCOPY WITH PROPOFOL;  Surgeon: Lucilla Lame, MD;  Location: [place];  Service: Endoscopy;  Laterality: N/A;   ESOPHAGOGASTRODUODENOSCOPY N/A 12/21/2014   Procedure:  ESOPHAGOGASTRODUODENOSCOPY (EGD);  Surgeon: Lucilla Lame, MD;  Location: [place];  Service: Gastroenterology;  Laterality: N/A;   ESOPHAGOGASTRODUODENOSCOPY (EGD) WITH PROPOFOL N/A 02/08/2016   Procedure: ESOPHAGOGASTRODUODENOSCOPY (EGD) WITH PROPOFOL;  Surgeon: Lucilla Lame, MD;  Location: [facility];  Service: Endoscopy;  Laterality: N/A;   FASCIOTOMY Right 05/30/2019   Procedure: FASCIOTOMY;  Surgeon: [name], MD;  Location: [facility];  Service: Vascular;  Laterality: Right;   FASCIOTOMY CLOSURE Right 06/04/2019   Procedure: FASCIOTOMY CLOSURE;  Surgeon: [name], MD;  Location: [facility];  Service: Vascular;  Laterality: Right;   HEMORROIDECTOMY  2014   LOWER EXTREMITY ANGIOGRAPHY Right 05/30/2019   Procedure: LOWER EXTREMITY ANGIOGRAPHY;  Surgeon: [name], MD;  Location: [place] [facility];  Service: Cardiovascular;  Laterality: Right;   LOWER EXTREMITY ANGIOGRAPHY Right 07/02/2020   Procedure: LOWER EXTREMITY ANGIOGRAPHY;  Surgeon: [name], MD;  Location: [place] [facility];  Service: Cardiovascular;  Laterality: Right;   PERIPHERAL VASCULAR CATHETERIZATION N/A 02/09/2016   Procedure: Abdominal Aortogram w/Lower Extremity;  Surgeon: [name], MD;  Location: [place] [facility];  Service: Cardiovascular;  Laterality: N/A;   PERIPHERAL VASCULAR CATHETERIZATION Right 02/10/2016   Procedure: Lower Extremity Angiography;  Surgeon: [name], MD;  Location: [place] [facility];  Service: Cardiovascular;  Laterality: Right;  POLYPECTOMY  11/05/2017   Procedure: POLYPECTOMY;  Surgeon: Lucilla Lame, MD;  Location: [place];  Service: Endoscopy;;   SHOULDER ARTHROSCOPY WITH ROTATOR CUFF REPAIR AND SUBACROMIAL DECOMPRESSION Right 02/27/2020   Procedure: RIGHT SHOULDER ARTHROSCOPY SUBACROMIAL DECOMPRESSION, DISTAL CLAVICLE EXCISION AND MINI-OPEN ROTATOR CUFF REPAIR;  Surgeon: Thornton Park, MD;  Location: [facility];   Service: Orthopedics;  Laterality: Right;   TONSILLECTOMY     VASCULAR SURGERY  [id number]   Fem-Pop Bypass     Current Outpatient Medications  Medication Sig Dispense Refill   apixaban (ELIQUIS) 2.5 MG TABS tablet Take 2.5 mg by mouth 2 (two) times daily.     atorvastatin (LIPITOR) 40 MG tablet Take 40 mg by mouth at bedtime.      Calcium Carb-Cholecalciferol 600-10 MG-MCG TABS Take 1 tablet by mouth daily.     clonazePAM (KLONOPIN) 0.5 MG tablet Take 0.25-0.5 mg by mouth daily as needed.     digoxin (LANOXIN) 0.125 MG tablet Take 125 mcg by mouth daily.     DULoxetine (CYMBALTA) 60 MG capsule Take 60 mg by mouth 2 (two) times daily.     gabapentin (NEURONTIN) 800 MG tablet Take 1 tablet (800 mg total) by mouth 3 (three) times daily. 90 tablet 0   ivabradine (CORLANOR) 7.5 MG TABS tablet Take 1 tablet (7.5 mg total) by mouth 2 (two) times daily with a meal. 60 tablet 3   losartan (COZAAR) 25 MG tablet Take 25 mg by mouth every morning.      omeprazole (PRILOSEC) 40 MG capsule Take 40 mg by mouth daily as needed.     Tocilizumab (ACTEMRA ACTPEN) 162 MG/0.9ML SOAJ Inject into the skin.     triamcinolone (KENALOG) 0.025 % cream Apply 1 Application topically at bedtime.     zolpidem (AMBIEN) 10 MG tablet TAKE 1 TABLET BY MOUTH NIGHTLY AT BEDTIME AS NEEDED FOR INSOMNIA 30 tablet 1   No current facility-administered medications for this visit.    Allergies:   Hydrocodone-acetaminophen, Amoxicillin, Diltiazem, Metoprolol tartrate, Oxycodone, and Vicodin [hydrocodone-acetaminophen]    Social History:   reports that she quit smoking about 31 years ago. Her smoking use included cigarettes. She has a 50.00 pack-year smoking history. She has never used smokeless tobacco. She reports that she does not drink alcohol and does not use drugs.   Family History:  family history includes CVA in her mother; Cancer in her father; Colon cancer in her father; Heart attack in her mother; Heart disease in her  maternal uncle.    ROS:     Review of Systems  Constitutional: Negative.   HENT: Negative.    Eyes: Negative.   Respiratory: Negative.    Gastrointestinal: Negative.   Genitourinary: Negative.   Musculoskeletal: Negative.   Skin: Negative.   Neurological: Negative.   Endo/Heme/Allergies: Negative.   Psychiatric/Behavioral: Negative.    All other systems reviewed and are negative.   All other systems are reviewed and negative.   PHYSICAL EXAM: VS:  BP 110/66   Pulse (!) 106   Ht '5\' 3"'$  (1.6 m)   Wt 136 lb 6.4 oz (61.9 kg)   SpO2 96%   BMI 24.16 kg/m  , BMI Body mass index is 24.16 kg/m. Last weight:  Wt Readings from Last 3 Encounters:  10/24/22 136 lb 6.4 oz (61.9 kg)  10/20/22 128 lb (58.1 kg)  10/18/22 128 lb (58.1 kg)    Physical Exam Constitutional:      Appearance: Normal appearance.  Cardiovascular:  Rate and Rhythm: Normal rate and regular rhythm.     Heart sounds: Normal heart sounds.  Pulmonary:     Effort: Pulmonary effort is normal.     Breath sounds: Normal breath sounds.  Musculoskeletal:     Right lower leg: No edema.     Left lower leg: No edema.  Neurological:     Mental Status: She is alert.     EKG: none today  Recent Labs: 10/09/2022: ALT 50; BUN 11; Creatinine, Ser 0.74; Hemoglobin 14.8; Platelets 197; Potassium 4.1; Sodium 139    Lipid Panel    Component Value Date/Time   CHOL 117 10/02/2022 1044   TRIG 79 10/02/2022 1044   HDL 55 10/02/2022 1044   CHOLHDL 2.1 10/02/2022 1044   LDLCALC 46 10/02/2022 1044      Other studies Reviewed: Patient: [id number] - Franne Grip DOB:  1950/11/15  Date:  07/07/2021 08:00 Provider: Neoma Laming MD Encounter: NUCLEAR STRESS TEST   Page 1 TESTS   [facility] [address] [place], [place] [address] [phone] STUDY:  Gated Stress / Rest Myocardial Perfusion Imaging Tomographic (SPECT) Including attenuation correction Wall Motion, Left Ventricular  Ejection Fraction By Gated Technique.Persantine Stress Test. SEX: Female   WEIGHT: 123 lbs  HEIGHT: 63 in     ARMS UP: YES/NO                                                                                                                                                                                REFERRING PHYSICIAN: Dr.Verdie Barrows Humphrey Rolls  INDICATION FOR STUDY: CAD                                                                                                                                                                                                                     TECHNIQUE:  Approximately 20 minutes following the intravenous administration of 10.6 mCi of Tc-49mSestamibi after stress testing in a reclined supine position with arms above their head if able to do so, gated SPECT imaging of the heart was performed. After about a 2hr break, the patient was injected intravenously with 31.2 mCi of Tc-927mestamibi.  Approximately 45 minutes later in the same position as stress imaging SPECT rest imaging of the heart was performed.  STRESS BY:  ShNeoma LamingMD PROTOCOL:  Persantine  DOSE ADMIN: 6.2 cc    ROUTE OF ADMINISTRATION: IV                                                                            MAX PRED HR: 152                     85%: 129               75%: 114                                                                                                                   RESTING BP: 130/72   RESTING HR: 95  PEAK BP: 122/66  PEAK HR: 111  EXERCISE DURATION:    4 min injection                                            REASON FOR TEST TERMINATION:    Protocol end                                                                                                                               SYMPTOMS:   None                                                                                                                                                                                                          EKG RESULTS: NSR. 97/min. Low voltage. Non specific ST/T changes, no significant ST changes with persantine.                                                              IMAGE QUALITY: Good  PERFUSION/WALL MOTION FINDINGS: EF = 80%. No perfusion defects, normal wall motion.                                                                           IMPRESSION: Normal stress test with normal LVEF.                                                                                                                                                                                                                                                                                         Neoma Laming, MD Stress Interpreting Physician / Nuclear Interpreting Physician       Neoma Laming MD  Electronically signed by: Neoma Laming     Date: 07/12/2021 10:59  Patient: [id number] - Franne Grip DOB:  Jan 18, 1951  Date:  03/17/2021 14:15 Provider: Neoma Laming MD Encounter: ECHO   Page 2 REASON FOR VISIT  Visit for: Echocardiogram/Essential primary hypertension  Sex:     female   wt=  132  lbs.  BP= 128/66  Height=  63  inches.   TESTS  Imaging: Echocardiogram:  An echocardiogram in (2-d) mode was performed and in Doppler mode with color flow velocity mapping was performed. The aortic valve cusps are abnormal 1.5    cm, flow velocity 1.1  m/s, and systolic calculated mean flow gradient 3  mmHg. Mitral valve diastolic peak flow velocity E 0.7    m/s and E/A ratio 1. Aortic root diameter 3.1  cm. The LVOT internal diameter 1.8  cm and flow velocity was abnormal 0.8   m/s. LV systolic dimension 1.9   cm, diastolic 3.1  cm, posterior wall thickness 0.9   cm, fractional shortening 37 %, and EF 72  %. IVS thickness 1.1  cm. LA dimension 3 cm  RIGHT atrium=  9.6  cm2. Mitral Valve =  Ea= 10.3  DT= 243 msec. Tricuspid Valve =  TR jet V=   2.4   RAP=5  RVSP= 29    mmHg. Aortic Valve is Normal. Mitral Valve has Trace Regurgitation. Pulmonic Valve is Normal. Tricuspid Valve has Mild Regurgitation.   ASSESSMENT  Technically adequate study.  Ejection fraction-72  Left Ventricle- Normal size   Left Ventricle diastolic dysfunction grade-Grade 1 relaxation abnormality  Right Ventricle- Normal size and function  Normal right ventricular wall motion  Left Atrium-Normal size  Right Atrium-Normal size  Aortic valve-Trivial Aortic valve calcification with No stenosis, No regurgitation  Pulmonic Valve-No stenosis, no regurgitation  Mitral Valve-Trace regurgitation  Tricuspid Valve-Mild Regurgitation  No Pericardial effusion.   THERAPY   Referring physician: Dionisio David  Sonographer: Ruta Hinds.   Neoma Laming MD  Electronically signed by: Neoma Laming     Date: 03/18/2021 10:21   ASSESSMENT AND PLAN:    ICD-10-CM   1. 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 Angiopathy, 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HCC)  I73.9     3. Atherosclerosis of native artery of both lower extremities with intermittent claudication ([place])  I70.213     4. Coronary artery disease involving native coronary artery of native heart without angina pectoris  I25.10     5. Bilateral carotid artery stenosis  I65.23        Problem List Items Addressed This Visit       Cardiovascular and  Mediastinum   Angiopathy, 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HCC)   Relevant Medications   ivabradine (CORLANOR) 7.5 MG TABS tablet   Carotid artery stenosis   Relevant Medications   ivabradine (CORLANOR) 7.5 MG TABS tablet   CAD (coronary artery disease)   Relevant Medications   ivabradine (CORLANOR) 7.5 MG TABS tablet   Atherosclerosis of native arteries of extremity with intermittent claudication (HCC)   Relevant Medications   ivabradine (CORLANOR) 7.5 MG TABS tablet   Inappropriate sinus tachycardia - Primary    Patient doing well. No complaints. Heart rate continues to be elevated. Developed a rash/blisters from metoprolol. Diltiazem caused headaches. Will attempt to get Corlanor approved. Patient now going to the [place]. Patient will notify [facility] if [facility] not [facility] Corlanor.       Relevant Medications   ivabradine (CORLANOR) 7.5 MG TABS tablet     Disposition:   Return in about 4 weeks (around 11/21/2022).    Total time spent: 30 minutes  Signed,  Neoma Laming, MD  10/24/2022 1:58 PM    [facility]

## 2022-10-31 ENCOUNTER — Ambulatory Visit: Payer: Self-pay | Admitting: Cardiovascular Disease

## 2022-11-01 ENCOUNTER — Telehealth: Payer: Self-pay

## 2022-11-01 DIAGNOSIS — D472 Monoclonal gammopathy: Secondary | ICD-10-CM | POA: Diagnosis not present

## 2022-11-01 DIAGNOSIS — M899 Disorder of bone, unspecified: Secondary | ICD-10-CM | POA: Diagnosis not present

## 2022-11-01 NOTE — Telephone Encounter (Signed)
GERD Review Call  [place]  76 years, Female  DOB: [birth year]  M: [phone]  __________________________________________________ GERD Review (HC) Chart Review What recent interventions have been made by any provider to improve the patient's conditions in the last 3 months?: Office Visit: 10/03/22 Evern Bio, NP.  For chronic venous insufficiency No medication changes. Consults: 10/02/22 Pain Med Gillis Santa, MD. For rotator cuff. No medication changes. 10/04/22 [place]. For follow-up. No medication changes. 10/06/22 Vascular Surgery Kris Hartmann, NP. For follow-up. STARTED Doxycycline Hyclate 100 mg 2 times daily. 10/20/22 Rheumatology Posey Pronto, Annita Brod, MD. For follow-up. STOPPED Diltiazem and Oxycodone. 10/24/22 Cardiology Dionisio David, MD. For follow-up. STARTED Ivabradine 7.5 mg 2 times daily STOPPED Alendronate. Calcium. Cholecalciferol, Doxycycline. Has there been any documented recent hospitalizations or ED visits since last visit with Clinical Lead?: Yes Brief Summary (including Medication and/or Diagnosis changes):: 10/09/22 [facility] at [place], MD For Abdominal Pain. No medication changes. Adherence Review Does the [facility] have access to medication refill data?: Yes Adherence rates for STAR metric medications: Atorvastatin 40 mg - 07/31/22 90 DS Losartan 25 mg -  07/31/22 90 DS Adherence rates for medications indicated for disease state being reviewed: None. Does the patient have >5 day gap between last estimated fill dates for any of the above medications?: No Disease State Questions Able to connect with the Patient?: Yes How frequently do you have symptoms of GERD or reflux?: never What OTC medications have you tried and what was the response?: Patient only takes omeprazole as needed and has not needed it for awhile . Is the patient a smoker or exposed to second-hand smoke?: No Which side  do you sleep on?: left side How long have you taken prescription medications for GERD?: Patient stated for a while, a few years. What GERD medications is patient currently taking and how are they taking them (i.e. Daily, twice daily, every other day, PRN)?: Omeprazole 40 mg - as needed What potential triggering foods have you identified and tried to limit intake in your diet?: spicy foods Have these changes been successful in reducing reflux symptoms?: N/A Engagement Notes Jerral Ralph on 10/31/2022 10:15 PM Reviewed 3 mins McLemore, Veronica on 10/30/2022 01:00 PM HC Chart Review: 15 min 10/01/22  HC Assessment call time spent: 15 min 10/01/22  Clinical Lead Review Review Adherence gaps identified?: No Drug Therapy Problems identified?: No Assessment: Controlled CPP call review Jerral Ralph, PharmD  97mns

## 2022-11-02 ENCOUNTER — Encounter: Payer: Self-pay | Admitting: Gastroenterology

## 2022-11-03 ENCOUNTER — Encounter: Payer: Self-pay | Admitting: Cardiovascular Disease

## 2022-11-06 ENCOUNTER — Telehealth: Payer: Self-pay | Admitting: *Deleted

## 2022-11-06 NOTE — Telephone Encounter (Signed)
Left voicemail for patient to call me back.  Call again on 11/06/2022   Colonoscopy 11/13/2022  Received fax [facility].    Patient is clear to have procedure. Pateint should stop taking Eliquis 2.5 mg 3 day prior and restart 1 day after.    First call was 10/10/2022

## 2022-11-07 ENCOUNTER — Other Ambulatory Visit: Payer: Self-pay | Admitting: Cardiovascular Disease

## 2022-11-07 DIAGNOSIS — I4711 Inappropriate sinus tachycardia, so stated: Secondary | ICD-10-CM

## 2022-11-07 MED ORDER — IVABRADINE HCL 7.5 MG PO TABS: 7.5000 mg | ORAL_TABLET | Freq: Two times a day (BID) | ORAL | 3 refills | Status: DC

## 2022-11-07 NOTE — Telephone Encounter (Signed)
Patient called back and noticed her of the comments below. Will call patient again on Thursday, 11/09/2022 to remind patient.

## 2022-11-08 ENCOUNTER — Encounter (INDEPENDENT_AMBULATORY_CARE_PROVIDER_SITE_OTHER): Payer: Self-pay | Admitting: Nurse Practitioner

## 2022-11-08 NOTE — Progress Notes (Signed)
Subjective:    Patient ID: [name], female    DOB: 10/10/1950, 72 y.o.   MRN: [id number] Chief Complaint  Patient presents with   Follow-up    f/u in 2 weeks with no studies    Zylpha Poynor is a 72 year old female who presents today for follow up evaluation of her right residual limb after a recent development of blisters and redness.  She notes that the redness has resolved but the blisters are still present.  This happened after the patient wore her prosthetic for an extended amount of time while she visited the [place].  She notes that the area was not significantly painful.  She had about 15-20 small fluid-filled blisters on the end of her residual limb.  Today the patient returns and there is no open blisters or ulcerations.  There are some dark areas which are likely some scars from the blisters but overall it is much improved.    Review of Systems  All other systems reviewed and are negative.      Objective:   Physical Exam Vitals reviewed.  HENT:     Head: Normocephalic.  Cardiovascular:     Rate and Rhythm: Normal rate.  Pulmonary:     Effort: Pulmonary effort is normal.  Musculoskeletal:     Right Lower Extremity: Right leg is amputated above knee.  Skin:    General: Skin is warm and dry.  Neurological:     Mental Status: She is alert and oriented to person, place, and time.  Psychiatric:        Mood and Affect: Mood normal.        Behavior: Behavior normal.        Thought Content: Thought content normal.        Judgment: Judgment normal.     BP 113/66 (BP Location: Left Arm)   Pulse 84   Resp 16   Ht 5\' 3"  (1.6 m)   Wt 128 lb (58.1 kg)   BMI 22.67 kg/m   Past Medical History:  Diagnosis Date   Arthritis    right shoulder   Depression    Dyspnea    Employs prosthetic leg    Right   GERD (gastroesophageal reflux disease)    History of blood clots    Hyperlipidemia    Hypertension    MGUS (monoclonal gammopathy of unknown significance)  11/2021   Mild mitral regurgitation    Osteoporosis    Peripheral vascular disease ([place])    Stomach ulcer    Vascular disease    Sees Dr. Delana Meyer   Vertigo    Last episode approx Aug 2015   Wears dentures    full upper    Social History   Socioeconomic History   Marital status: Single    Spouse name: Not on file   Number of children: Not on file   Years of education: Not on file   Highest education level: Not on file  Occupational History   Not on file  Tobacco Use   Smoking status: Former    Packs/day: 2.00    Years: 25.00    Additional pack years: 0.00    Total pack years: 50.00    Types: Cigarettes    Quit date: 08/22/1991    Years since quitting: 31.2   Smokeless tobacco: Never  Vaping Use   Vaping Use: Never used  Substance and Sexual Activity   Alcohol use: No    Alcohol/week: 0.0 standard drinks of alcohol  Drug use: Never   Sexual activity: Not Currently  Other Topics Concern   Not on file  Social History Narrative   Not on file   Social Determinants of Health   Financial Resource Strain: Not on file  Food Insecurity: Not on file  Transportation Needs: Not on file  Physical Activity: Not on file  Stress: Not on file  Social Connections: Not on file  Intimate Partner Violence: Not on file    Past Surgical History:  Procedure Laterality Date   ADENOIDECTOMY     AMPUTATION Right 08/11/2020   Procedure: AMPUTATION ABOVE KNEE;  Surgeon: [name], MD;  Location: [facility];  Service: Vascular;  Laterality: Right;   ARTERY BIOPSY Right 07/14/2019   Procedure: BIOPSY TEMPORAL ARTERY;  Surgeon: [name], MD;  Location: [facility];  Service: Vascular;  Laterality: Right;   BREAST CYST ASPIRATION Left    CARDIAC CATHETERIZATION  02/15/2017   [facility]   COLONOSCOPY WITH PROPOFOL N/A 10/22/2017   Procedure: COLONOSCOPY WITH PROPOFOL;  Surgeon: Lucilla Lame, MD;  Location: [place];  Service: Endoscopy;  Laterality: N/A;  specimens not  taken--pt on Plavix will be brought back in after 7 days off med   COLONOSCOPY WITH PROPOFOL N/A 11/05/2017   Procedure: COLONOSCOPY WITH PROPOFOL;  Surgeon: Lucilla Lame, MD;  Location: [place];  Service: Endoscopy;  Laterality: N/A;   ESOPHAGOGASTRODUODENOSCOPY N/A 12/21/2014   Procedure: ESOPHAGOGASTRODUODENOSCOPY (EGD);  Surgeon: Lucilla Lame, MD;  Location: [place];  Service: Gastroenterology;  Laterality: N/A;   ESOPHAGOGASTRODUODENOSCOPY (EGD) WITH PROPOFOL N/A 02/08/2016   Procedure: ESOPHAGOGASTRODUODENOSCOPY (EGD) WITH PROPOFOL;  Surgeon: Lucilla Lame, MD;  Location: [facility];  Service: Endoscopy;  Laterality: N/A;   FASCIOTOMY Right 05/30/2019   Procedure: FASCIOTOMY;  Surgeon: [name], MD;  Location: [facility];  Service: Vascular;  Laterality: Right;   FASCIOTOMY CLOSURE Right 06/04/2019   Procedure: FASCIOTOMY CLOSURE;  Surgeon: [name], MD;  Location: [facility];  Service: Vascular;  Laterality: Right;   HEMORROIDECTOMY  2014   LOWER EXTREMITY ANGIOGRAPHY Right 05/30/2019   Procedure: LOWER EXTREMITY ANGIOGRAPHY;  Surgeon: [name], MD;  Location: [place] [facility];  Service: Cardiovascular;  Laterality: Right;   LOWER EXTREMITY ANGIOGRAPHY Right 07/02/2020   Procedure: LOWER EXTREMITY ANGIOGRAPHY;  Surgeon: [name], MD;  Location: [place] [facility];  Service: Cardiovascular;  Laterality: Right;   PERIPHERAL VASCULAR CATHETERIZATION N/A 02/09/2016   Procedure: Abdominal Aortogram w/Lower Extremity;  Surgeon: [name], MD;  Location: [place] [facility];  Service: Cardiovascular;  Laterality: N/A;   PERIPHERAL VASCULAR CATHETERIZATION Right 02/10/2016   Procedure: Lower Extremity Angiography;  Surgeon: [name], MD;  Location: [place] [facility];  Service: Cardiovascular;  Laterality: Right;   POLYPECTOMY  11/05/2017   Procedure: POLYPECTOMY;  Surgeon: Lucilla Lame, MD;  Location: [place];  Service: Endoscopy;;   SHOULDER ARTHROSCOPY WITH ROTATOR CUFF REPAIR AND SUBACROMIAL DECOMPRESSION Right 02/27/2020   Procedure: RIGHT SHOULDER ARTHROSCOPY SUBACROMIAL DECOMPRESSION, DISTAL CLAVICLE EXCISION AND MINI-OPEN ROTATOR CUFF REPAIR;  Surgeon: Thornton Park, MD;  Location: [facility];  Service: Orthopedics;  Laterality: Right;   TONSILLECTOMY     VASCULAR SURGERY  [id number]   Fem-Pop Bypass    Family History  Problem Relation Age of Onset   CVA Mother    Heart attack Mother    Cancer Father        colon cancer   Colon cancer Father  Heart disease Maternal Uncle    Breast cancer Neg Hx     Allergies  Allergen Reactions   Hydrocodone-Acetaminophen     Other reaction(s): Flushing   Amoxicillin Other (See Comments)    Yeast infection   Diltiazem Other (See Comments)    headache   Metoprolol Tartrate Rash   Oxycodone Itching   Vicodin [Hydrocodone-Acetaminophen] Hives and Rash    Flushing       Latest Ref Rng & Units 10/09/2022    2:26 PM 10/02/2022   10:44 AM 05/31/2022    7:49 AM  CBC  WBC 4.0 - 10.5 K/uL 8.1  12.5  9.4   Hemoglobin 12.0 - 15.0 g/dL 14.8  15.3  15.6   Hematocrit 36.0 - 46.0 % 45.6  45.9  48.4   Platelets 150 - 400 K/uL 197   207       CMP     Component Value Date/Time   NA 139 10/09/2022 1426   NA 141 10/02/2022 1044   K 4.1 10/09/2022 1426   CL 106 10/09/2022 1426   CO2 26 10/09/2022 1426   GLUCOSE 99 10/09/2022 1426   BUN 11 10/09/2022 1426   BUN 14 10/02/2022 1044   CREATININE 0.74 10/09/2022 1426   CREATININE 0.88 05/16/2016 1034   CALCIUM 8.9 10/09/2022 1426   PROT 6.7 10/09/2022 1426   PROT 6.0 10/02/2022 1044   ALBUMIN 4.0 10/09/2022 1426   ALBUMIN 4.2 10/02/2022 1044   AST 50 (H) 10/09/2022 1426   ALT 50 (H) 10/09/2022 1426   ALKPHOS 65 10/09/2022 1426   BILITOT 0.8 10/09/2022 1426   BILITOT 0.5 10/02/2022 1044   GFRNONAA >60 10/09/2022 1426   GFRNONAA 66 05/01/2016 0846   GFRAA >60 02/18/2020 0949   GFRAA  76 05/01/2016 0846     VAS [place] ABI WITH/WO TBI  Result Date: 09/04/2022  LOWER EXTREMITY DOPPLER STUDY Patient Name:  Allisson Zimny  Date of Exam:   08/31/2022 Medical Rec #: [id number]             Accession #:    [id number] Date of Birth: [birth year]              Patient Gender: F Patient Age:   48 years Exam Location:  [facility] Procedure:      VAS [place] ABI WITH/WO TBI Referring Phys: GREGORY SCHNIER --------------------------------------------------------------------------------  Indications: Rest pain, and peripheral artery disease. High Risk Factors: Hypertension, past history of smoking, coronary artery                    disease.  Vascular Interventions: 02/10/2016 PTA of right femoral to popliteal autogenous                         bypass graft. PTA right distal posterior tibial artery.                         12/2008 and 02/1990 Right femoral to popliteal autogenous                         bypass graft.                          05/30/2019: PTA and Stent placement at 2 locations in  the Femoral below knee popliteal bypass. Mechanical                         Thrombectomy Right Popliteal bypass and TibioPeroneal                         trunk and Proximal Peroneal Artery. Infusion of TPA to                         the Right Femoral-Popliteal Bypass.                          05/30/2019: Compartment Syndrome Right Lower Extremity                         Status Post Revascularization.                          06/04/2019: Compartment Syndrome Status Post                         Fasciotomies. Performing Technologist: Delorise Shiner RVT  Examination Guidelines: A complete evaluation includes at minimum, Doppler waveform signals and systolic blood pressure reading at the level of bilateral brachial, anterior tibial, and posterior tibial arteries, when vessel segments are accessible. Bilateral testing is considered an integral part of a complete examination. Photoelectric  Plethysmograph (PPG) waveforms and toe systolic pressure readings are included as required and additional duplex testing as needed. Limited examinations for reoccurring indications may be performed as noted.  ABI Findings: +--------+------------------+-----+--------+--------+ Right   Rt Pressure (mmHg)IndexWaveformComment  +--------+------------------+-----+--------+--------+ [id number]                                     +--------+------------------+-----+--------+--------+ +---------+------------------+-----+----------+-------+ Left     Lt Pressure (mmHg)IndexWaveform  Comment +---------+------------------+-----+----------+-------+ Brachial 123                                      +---------+------------------+-----+----------+-------+ PTA      139               1.13 triphasic         +---------+------------------+-----+----------+-------+ PERO     131               1.08 triphasic         +---------+------------------+-----+----------+-------+ DP       102               0.83 monophasic        +---------+------------------+-----+----------+-------+ Great Toe82                0.67                   +---------+------------------+-----+----------+-------+ +-------+-----------+-----------+------------+------------+ ABI/TBIToday's ABIToday's TBIPrevious ABIPrevious TBI +-------+-----------+-----------+------------+------------+ Right  AKA                   AKA                      +-------+-----------+-----------+------------+------------+ Left   1.13       0.67       1.20  0.80         +-------+-----------+-----------+------------+------------+ Left ABIs appear essentially unchanged compared to prior study on 05/01/2022.  Summary: Left: Resting left ankle-brachial index is within normal range. The left toe-brachial index is abnormal. *See table(s) above for measurements and observations.  Electronically signed by [name] MD on 09/04/2022 at  4:49:06 PM.    Final        Assessment & Plan:   1. S/P AKA (above knee amputation), right ([place]) The patient has resolved multiple blisters.  Patient is advised to be mindful of her prosthetic use as I suspect it was due to irritation as initial cause.  No further intervention at this time.  Patient will follow-up as regularly scheduled in July.  2. Essential hypertension Continue antihypertensive medications as already ordered, these medications have been reviewed and there are no changes at this time.   Current Outpatient Medications on File Prior to Visit  Medication Sig Dispense Refill   apixaban (ELIQUIS) 2.5 MG TABS tablet Take 2.5 mg by mouth 2 (two) times daily.     atorvastatin (LIPITOR) 40 MG tablet Take 40 mg by mouth at bedtime.      Calcium Carb-Cholecalciferol 600-10 MG-MCG TABS Take 1 tablet by mouth daily.     clonazePAM (KLONOPIN) 0.5 MG tablet Take 0.25-0.5 mg by mouth daily as needed.     digoxin (LANOXIN) 0.125 MG tablet Take 125 mcg by mouth daily.     DULoxetine (CYMBALTA) 60 MG capsule Take 60 mg by mouth 2 (two) times daily.     losartan (COZAAR) 25 MG tablet Take 25 mg by mouth every morning.      omeprazole (PRILOSEC) 40 MG capsule Take 40 mg by mouth daily as needed.     Tocilizumab (ACTEMRA ACTPEN) 162 MG/0.9ML SOAJ Inject into the skin.     triamcinolone (KENALOG) 0.025 % cream Apply 1 Application topically at bedtime.     zolpidem (AMBIEN) 10 MG tablet TAKE 1 TABLET BY MOUTH NIGHTLY AT BEDTIME AS NEEDED FOR INSOMNIA 30 tablet 1   No current facility-administered medications on file prior to visit.    There are no Patient Instructions on file for this visit. No follow-ups on file.   Kris Hartmann, NP

## 2022-11-09 NOTE — Telephone Encounter (Signed)
Patient have been reminded to stop Eliquis

## 2022-11-12 ENCOUNTER — Other Ambulatory Visit: Payer: Self-pay | Admitting: Nurse Practitioner

## 2022-11-13 ENCOUNTER — Ambulatory Visit: Payer: Medicare Other | Admitting: Anesthesiology

## 2022-11-13 ENCOUNTER — Ambulatory Visit
Admission: RE | Admit: 2022-11-13 | Discharge: 2022-11-13 | Disposition: A | Discharge status: Home / Self Care | Payer: Medicare Other | Source: Ambulatory Visit | Attending: Gastroenterology | Admitting: Gastroenterology

## 2022-11-13 ENCOUNTER — Other Ambulatory Visit: Payer: Self-pay

## 2022-11-13 ENCOUNTER — Encounter
Admission: RE | Disposition: A | Discharge status: Home / Self Care | Payer: Self-pay | Source: Ambulatory Visit | Attending: Gastroenterology

## 2022-11-13 ENCOUNTER — Encounter: Payer: Self-pay | Admitting: Gastroenterology

## 2022-11-13 DIAGNOSIS — I2510 Atherosclerotic heart disease of native coronary artery without angina pectoris: Secondary | ICD-10-CM | POA: Diagnosis not present

## 2022-11-13 DIAGNOSIS — Z86010 Personal history of colonic polyps: Secondary | ICD-10-CM | POA: Insufficient documentation

## 2022-11-13 DIAGNOSIS — Z87891 Personal history of nicotine dependence: Secondary | ICD-10-CM | POA: Insufficient documentation

## 2022-11-13 DIAGNOSIS — D124 Benign neoplasm of descending colon: Secondary | ICD-10-CM | POA: Diagnosis not present

## 2022-11-13 DIAGNOSIS — I10 Essential (primary) hypertension: Secondary | ICD-10-CM | POA: Diagnosis not present

## 2022-11-13 DIAGNOSIS — Z1211 Encounter for screening for malignant neoplasm of colon: Secondary | ICD-10-CM | POA: Insufficient documentation

## 2022-11-13 DIAGNOSIS — I739 Peripheral vascular disease, unspecified: Secondary | ICD-10-CM | POA: Insufficient documentation

## 2022-11-13 DIAGNOSIS — D120 Benign neoplasm of cecum: Secondary | ICD-10-CM | POA: Insufficient documentation

## 2022-11-13 DIAGNOSIS — G709 Myoneural disorder, unspecified: Secondary | ICD-10-CM | POA: Diagnosis not present

## 2022-11-13 DIAGNOSIS — K635 Polyp of colon: Secondary | ICD-10-CM | POA: Insufficient documentation

## 2022-11-13 DIAGNOSIS — E785 Hyperlipidemia, unspecified: Secondary | ICD-10-CM | POA: Diagnosis not present

## 2022-11-13 DIAGNOSIS — F32A Depression, unspecified: Secondary | ICD-10-CM | POA: Insufficient documentation

## 2022-11-13 DIAGNOSIS — Z79899 Other long term (current) drug therapy: Secondary | ICD-10-CM | POA: Diagnosis not present

## 2022-11-13 DIAGNOSIS — D126 Benign neoplasm of colon, unspecified: Secondary | ICD-10-CM | POA: Diagnosis not present

## 2022-11-13 DIAGNOSIS — M1990 Unspecified osteoarthritis, unspecified site: Secondary | ICD-10-CM | POA: Insufficient documentation

## 2022-11-13 DIAGNOSIS — K219 Gastro-esophageal reflux disease without esophagitis: Secondary | ICD-10-CM | POA: Insufficient documentation

## 2022-11-13 HISTORY — PX: POLYPECTOMY: SHX5525

## 2022-11-13 HISTORY — DX: Presence of artificial limb (complete) (partial), unspecified: Z97.10

## 2022-11-13 HISTORY — PX: COLONOSCOPY WITH PROPOFOL: SHX5780

## 2022-11-13 SURGERY — COLONOSCOPY WITH PROPOFOL
Anesthesia: Choice | Site: Rectum

## 2022-11-13 MED ORDER — STERILE WATER FOR IRRIGATION IR SOLN
Status: DC | PRN
  Administered 2022-11-13: 50 mL

## 2022-11-13 MED ORDER — LACTATED RINGERS IV SOLN: INTRAVENOUS | Status: DC

## 2022-11-13 MED ORDER — PROPOFOL 10 MG/ML IV BOLUS
INTRAVENOUS | Status: DC | PRN
  Administered 2022-11-13: 40 mg via INTRAVENOUS
  Administered 2022-11-13: 30 mg via INTRAVENOUS
  Administered 2022-11-13: 80 mg via INTRAVENOUS
  Administered 2022-11-13: 40 mg via INTRAVENOUS

## 2022-11-13 MED ORDER — SODIUM CHLORIDE 0.9 % IV SOLN: INTRAVENOUS | Status: DC

## 2022-11-13 MED ORDER — LIDOCAINE HCL (CARDIAC) PF 100 MG/5ML IV SOSY
PREFILLED_SYRINGE | INTRAVENOUS | Status: DC | PRN
  Administered 2022-11-13: 60 mg via INTRAVENOUS
  Administered 2022-11-13: 40 mg via INTRAVENOUS

## 2022-11-13 SURGICAL SUPPLY — 9 items
FORCEPS BIOP RAD 4 LRG CAP 4 (CUTTING FORCEPS) IMPLANT
GOWN CVR UNV OPN BCK APRN NK (MISCELLANEOUS) ×4 IMPLANT
GOWN ISOL THUMB LOOP REG UNIV (MISCELLANEOUS) ×4
KIT PRC NS LF DISP ENDO (KITS) ×2 IMPLANT
KIT PROCEDURE OLYMPUS (KITS) ×2
MANIFOLD NEPTUNE II (INSTRUMENTS) ×2 IMPLANT
SNARE COLD EXACTO (MISCELLANEOUS) IMPLANT
TRAP ETRAP POLY (MISCELLANEOUS) IMPLANT
WATER STERILE IRR 250ML POUR (IV SOLUTION) ×2 IMPLANT

## 2022-11-13 NOTE — Anesthesia Postprocedure Evaluation (Signed)
Anesthesia Post Note  Patient: Sara [name]  Procedure(s) Performed: COLONOSCOPY WITH PROPOFOL (Rectum) POLYPECTOMY (Rectum)  Patient location during evaluation: PACU Anesthesia Type: General Level of consciousness: awake and alert Pain management: pain level controlled Vital Signs Assessment: post-procedure vital signs reviewed and stable Respiratory status: spontaneous breathing, nonlabored ventilation, respiratory function stable and patient connected to nasal cannula oxygen Cardiovascular status: blood pressure returned to baseline and stable Postop Assessment: no apparent nausea or vomiting Anesthetic complications: no  No notable events documented.   Last Vitals:  Vitals:   11/13/22 0954 11/13/22 1003  BP: 107/62 118/64  Pulse: 79 76  Resp: 15 17  Temp: 36.7 C 36.7 C  SpO2: 93% 93%    Last Pain: There were no vitals filed for this visit.               Dimas Millin

## 2022-11-13 NOTE — Anesthesia Preprocedure Evaluation (Signed)
Anesthesia Evaluation  Patient identified by MRN, date of birth, ID band Patient awake    Reviewed: Allergy & Precautions, H&P , NPO status , Patient's Chart, lab work & pertinent test results  History of Anesthesia Complications Negative for: history of anesthetic complications  Airway Mallampati: III  TM Distance: >3 FB Neck ROM: full    Dental  (+) Upper Dentures, Dental Advisory Given, Chipped   Pulmonary neg shortness of breath, neg COPD, neg recent URI, Not current smoker, former smoker   breath sounds clear to auscultation       Cardiovascular hypertension, (-) angina + CAD and + Peripheral Vascular Disease  (-) Past MI and (-) Cardiac Stents (-) dysrhythmias  Rhythm:Regular Rate:Normal - Systolic murmurs [id number]: 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, healing well   Neuro/Psych  PSYCHIATRIC DISORDERS  Depression     Neuromuscular disease    GI/Hepatic Neg liver ROS, hiatal hernia, PUD,GERD  Controlled,,  Endo/Other  negative endocrine ROS    Renal/GU negative Renal ROS  negative genitourinary   Musculoskeletal  (+) Arthritis ,    Abdominal   Peds  Hematology negative hematology ROS (+)   Anesthesia Other Findings Past Medical History: No date: Arthritis     Comment:  right shoulder No date: Depression No date: GERD (gastroesophageal reflux disease) No date: History of blood clots No date: Hyperlipidemia No date: Hypertension No date: Mild mitral regurgitation No date: Osteoporosis No date: Stomach ulcer No date: Vascular disease     Comment:  Sees Dr. Delana Meyer No date: Vertigo     Comment:  Last episode approx Aug 2015 No date: Wears dentures     Comment:  full upper  Past Surgical History: No date: ADENOIDECTOMY No date: BREAST CYST ASPIRATION; Left 02/15/2017: CARDIAC CATHETERIZATION     Comment:  [facility] 10/22/2017: COLONOSCOPY WITH PROPOFOL; N/A      Comment:  Procedure: COLONOSCOPY WITH PROPOFOL;  Surgeon: Lucilla Lame, MD;  Location: [place];  Service:               Endoscopy;  Laterality: N/A;  specimens not taken--pt on               Plavix will be brought back in after 7 days off med 11/05/2017: COLONOSCOPY WITH PROPOFOL; N/A     Comment:  Procedure: COLONOSCOPY WITH PROPOFOL;  Surgeon: Lucilla Lame, MD;  Location: [place];  Service:               Endoscopy;  Laterality: N/A; 12/21/2014: ESOPHAGOGASTRODUODENOSCOPY; N/A     Comment:  Procedure: ESOPHAGOGASTRODUODENOSCOPY (EGD);  Surgeon:               Lucilla Lame, MD;  Location: [place];                Service: Gastroenterology;  Laterality: N/A; 02/08/2016: ESOPHAGOGASTRODUODENOSCOPY (EGD) WITH PROPOFOL; N/A     Comment:  Procedure: ESOPHAGOGASTRODUODENOSCOPY (EGD) WITH               PROPOFOL;  Surgeon: Lucilla Lame, MD;  Location: [facility];  Service: Endoscopy;  Laterality: N/A; 05/30/2019: FASCIOTOMY; Right     Comment:  Procedure: FASCIOTOMY;  Surgeon: [name], MD;  Location: [facility];  Service: Vascular;  Laterality:               Right; 06/04/2019: FASCIOTOMY CLOSURE; Right     Comment:  Procedure: FASCIOTOMY CLOSURE;  Surgeon: [name], MD;  Location: [facility];  Service: Vascular;                Laterality: Right; 2014: HEMORROIDECTOMY 05/30/2019: LOWER EXTREMITY ANGIOGRAPHY; Right     Comment:  Procedure: LOWER EXTREMITY ANGIOGRAPHY;  Surgeon:               [name], MD;  Location: [place] [facility];               Service: Cardiovascular;  Laterality: Right; 02/09/2016: PERIPHERAL VASCULAR CATHETERIZATION; N/A     Comment:  Procedure: Abdominal Aortogram w/Lower Extremity;                Surgeon: [name], MD;  Location: [place]               [facility];  Service: Cardiovascular;  Laterality: N/A; 02/10/2016: PERIPHERAL  VASCULAR CATHETERIZATION; Right     Comment:  Procedure: Lower Extremity Angiography;  Surgeon:               [name], MD;  Location: [place] [facility];                Service: Cardiovascular;  Laterality: Right; 11/05/2017: POLYPECTOMY     Comment:  Procedure: POLYPECTOMY;  Surgeon: Lucilla Lame, MD;                Location: [place];  Service: Endoscopy;; No date: TONSILLECTOMY [id number]: VASCULAR SURGERY     Comment:  Fem-Pop Bypass     Reproductive/Obstetrics negative OB ROS                             Anesthesia Physical Anesthesia Plan  ASA: III  Anesthesia Plan: General   Post-op Pain Management: Minimal or no pain anticipated   Induction: Intravenous  PONV Risk Score and Plan: 4 or greater and Propofol infusion and TIVA  Airway Management Planned: Nasal Cannula and Natural Airway  Additional Equipment: None  Intra-op Plan:   Post-operative Plan: Extubation in OR  Informed Consent: I have reviewed the patients History and Physical, chart, labs and discussed the procedure including the risks, benefits and alternatives for the proposed anesthesia with the patient or authorized representative who has indicated his/her understanding and acceptance.     Dental Advisory Given  Plan Discussed with: Anesthesiologist  Anesthesia Plan Comments: (Discussed risks of anesthesia with patient, including possibility of difficulty with spontaneous ventilation under anesthesia necessitating airway intervention, PONV, and rare risks such as cardiac or respiratory or neurological events, and allergic reactions. Discussed the role of CRNA in patient's perioperative care. Patient understands.)        Anesthesia Quick Evaluation

## 2022-11-13 NOTE — Transfer of Care (Signed)
Immediate Anesthesia Transfer of Care Note  Patient: Sara [name]  Procedure(s) Performed: COLONOSCOPY WITH PROPOFOL (Rectum) POLYPECTOMY (Rectum)  Patient Location: PACU  Anesthesia Type: No value filed.  Level of Consciousness: awake, alert  and patient cooperative  Airway and Oxygen Therapy: Patient Spontanous Breathing and Patient connected to supplemental oxygen  Post-op Assessment: Post-op Vital signs reviewed, Patient's Cardiovascular Status Stable, Respiratory Function Stable, Patent Airway and No signs of Nausea or vomiting  Post-op Vital Signs: Reviewed and stable  Complications: No notable events documented.

## 2022-11-13 NOTE — H&P (Signed)
Lucilla Lame, MD [facility] [address], [place], [place] [address] Phone:[phone] Fax : [phone]  Primary Care Physician:  Evern Bio, NP Primary Gastroenterologist:  Dr. Allen Norris  Pre-Procedure History & Physical: HPI:  Sara [name] is a 72 y.o. female is here for an colonoscopy.   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 Delana Meyer   Vertigo    Last episode approx Aug 2015   Wears dentures    full upper    Past Surgical History:  Procedure Laterality Date   ADENOIDECTOMY     AMPUTATION Right 08/11/2020   Procedure: AMPUTATION ABOVE KNEE;  Surgeon: [name], MD;  Location: [facility];  Service: Vascular;  Laterality: Right;   ARTERY BIOPSY Right 07/14/2019   Procedure: BIOPSY TEMPORAL ARTERY;  Surgeon: [name], MD;  Location: [facility];  Service: Vascular;  Laterality: Right;   BREAST CYST ASPIRATION Left    CARDIAC CATHETERIZATION  02/15/2017   [facility]   COLONOSCOPY WITH PROPOFOL N/A 10/22/2017   Procedure: COLONOSCOPY WITH PROPOFOL;  Surgeon: Lucilla Lame, MD;  Location: [place];  Service: Endoscopy;  Laterality: N/A;  specimens not taken--pt on Plavix will be brought back in after 7 days off med   COLONOSCOPY WITH PROPOFOL N/A 11/05/2017   Procedure: COLONOSCOPY WITH PROPOFOL;  Surgeon: Lucilla Lame, MD;  Location: [place];  Service: Endoscopy;  Laterality: N/A;   ESOPHAGOGASTRODUODENOSCOPY N/A 12/21/2014   Procedure: ESOPHAGOGASTRODUODENOSCOPY (EGD);  Surgeon: Lucilla Lame, MD;  Location: [place];  Service:  Gastroenterology;  Laterality: N/A;   ESOPHAGOGASTRODUODENOSCOPY (EGD) WITH PROPOFOL N/A 02/08/2016   Procedure: ESOPHAGOGASTRODUODENOSCOPY (EGD) WITH PROPOFOL;  Surgeon: Lucilla Lame, MD;  Location: [facility];  Service: Endoscopy;  Laterality: N/A;   FASCIOTOMY Right 05/30/2019   Procedure: FASCIOTOMY;  Surgeon: [name], MD;  Location: [facility];  Service: Vascular;  Laterality: Right;   FASCIOTOMY CLOSURE Right 06/04/2019   Procedure: FASCIOTOMY CLOSURE;  Surgeon: [name], MD;  Location: [facility];  Service: Vascular;  Laterality: Right;   HEMORROIDECTOMY  2014   LOWER EXTREMITY ANGIOGRAPHY Right 05/30/2019   Procedure: LOWER EXTREMITY ANGIOGRAPHY;  Surgeon: [name], MD;  Location: [place] [facility];  Service: Cardiovascular;  Laterality: Right;   LOWER EXTREMITY ANGIOGRAPHY Right 07/02/2020   Procedure: LOWER EXTREMITY ANGIOGRAPHY;  Surgeon: [name], MD;  Location: [place] [facility];  Service: Cardiovascular;  Laterality: Right;   PERIPHERAL VASCULAR CATHETERIZATION N/A 02/09/2016   Procedure: Abdominal Aortogram w/Lower Extremity;  Surgeon: [name], MD;  Location: [place] [facility];  Service: Cardiovascular;  Laterality: N/A;   PERIPHERAL VASCULAR CATHETERIZATION Right 02/10/2016   Procedure: Lower Extremity Angiography;  Surgeon: [name], MD;  Location: [place] [facility];  Service: Cardiovascular;  Laterality: Right;   POLYPECTOMY  11/05/2017   Procedure: POLYPECTOMY;  Surgeon: Lucilla Lame, MD;  Location: [place];  Service: Endoscopy;;   SHOULDER ARTHROSCOPY WITH ROTATOR CUFF REPAIR AND SUBACROMIAL DECOMPRESSION Right 02/27/2020  Procedure: RIGHT SHOULDER ARTHROSCOPY SUBACROMIAL DECOMPRESSION, DISTAL CLAVICLE EXCISION AND MINI-OPEN ROTATOR CUFF REPAIR;  Surgeon: Thornton Park, MD;  Location: [facility];  Service: Orthopedics;  Laterality: Right;   TONSILLECTOMY     VASCULAR SURGERY  [id number]   Fem-Pop  Bypass    Prior to Admission medications   Medication Sig Start Date End Date Taking? Authorizing Provider  apixaban (ELIQUIS) 2.5 MG TABS tablet Take 2.5 mg by mouth 2 (two) times daily.   Yes [provider]  atorvastatin (LIPITOR) 40 MG tablet Take 40 mg by mouth at bedtime.  12/08/16  Yes [provider]  Calcium Carb-Cholecalciferol 600-10 MG-MCG TABS Take 1 tablet by mouth daily.   Yes [provider]  clonazePAM (KLONOPIN) 0.5 MG tablet Take 0.25-0.5 mg by mouth daily as needed. 06/02/22  Yes [provider]  digoxin (LANOXIN) 0.125 MG tablet Take 125 mcg by mouth daily. 06/06/22  Yes [provider]  DULoxetine (CYMBALTA) 60 MG capsule TAKE 1 CAPSULE BY MOUTH TWICE A DAY 11/13/22  Yes Boswell, Chelsa, NP  gabapentin (NEURONTIN) 800 MG tablet Take 1 tablet (800 mg total) by mouth 3 (three) times daily. 10/20/22 11/19/22 Yes Kris Hartmann, NP  ivabradine (CORLANOR) 7.5 MG TABS tablet Take 1 tablet (7.5 mg total) by mouth 2 (two) times daily with a meal. 11/07/22  Yes Dionisio David, MD  losartan (COZAAR) 25 MG tablet Take 25 mg by mouth every morning.  12/01/16  Yes [provider]  omeprazole (PRILOSEC) 40 MG capsule Take 40 mg by mouth daily as needed. 06/16/22  Yes [provider]  Tocilizumab (ACTEMRA ACTPEN) 162 MG/0.9ML SOAJ Inject into the skin. 09/12/22  Yes [provider]  triamcinolone (KENALOG) 0.025 % cream Apply 1 Application topically at bedtime. 06/02/22  Yes [provider]  zolpidem (AMBIEN) 10 MG tablet TAKE 1 TABLET BY MOUTH NIGHTLY AT BEDTIME AS NEEDED FOR INSOMNIA 10/20/22  Yes Evern Bio, NP    Allergies as of 09/12/2022 - Review Complete 09/04/2022  Allergen Reaction Noted   Hydrocodone-acetaminophen  05/29/2019   Amoxicillin Other (See Comments) 03/09/2015   Metoprolol tartrate Rash 07/08/2019   Oxycodone Itching 07/10/2019   Vicodin [hydrocodone-acetaminophen] Hives and Rash  12/18/2014    Family History  Problem Relation Age of Onset   CVA Mother    Heart attack Mother    Cancer Father        colon cancer   Colon cancer Father    Heart disease Maternal Uncle    Breast cancer Neg Hx     Social History   Socioeconomic History   Marital status: Single    Spouse name: Not on file   Number of children: Not on file   Years of education: Not on file   Highest education level: Not on file  Occupational History   Not on file  Tobacco Use   Smoking status: Former    Packs/day: 2.00    Years: 25.00    Additional pack years: 0.00    Total pack years: 50.00    Types: Cigarettes    Quit date: 08/22/1991    Years since quitting: 31.2   Smokeless tobacco: Never  Vaping Use   Vaping Use: Never used  Substance and Sexual Activity   Alcohol use: No    Alcohol/week: 0.0 standard drinks of alcohol   Drug use: Never   Sexual activity: Not Currently  Other Topics Concern   Not on file  Social History Narrative   Not  on file   Social Determinants of Health   Financial Resource Strain: Not on file  Food Insecurity: Not on file  Transportation Needs: Not on file  Physical Activity: Not on file  Stress: Not on file  Social Connections: Not on file  Intimate Partner Violence: Not on file    Review of Systems: See HPI, otherwise negative ROS  Physical Exam: BP (!) 149/77   Pulse 85   Temp 98.1 F (36.7 C)   Ht 5\' 3"  (1.6 m)   Wt 56.7 kg   SpO2 93%   BMI 22.14 kg/m  General:   Alert,  pleasant and cooperative in NAD Head:  Normocephalic and atraumatic. Neck:  Supple; no masses or thyromegaly. Lungs:  Clear throughout to auscultation.    Heart:  Regular rate and rhythm. Abdomen:  Soft, nontender and nondistended. Normal bowel sounds, without guarding, and without rebound.   Neurologic:  Alert and  oriented x4;  grossly normal neurologically.  Impression/Plan: Sara [name] is here for an colonoscopy to be performed for a history of  adenomatous polyps on 2019   Risks, benefits, limitations, and alternatives regarding  colonoscopy have been reviewed with the patient.  Questions have been answered.  All parties agreeable.   Lucilla Lame, MD  11/13/2022, 9:21 AM

## 2022-11-13 NOTE — Op Note (Signed)
[facility] Gastroenterology Patient Name: Sara [name] Procedure Date: 11/13/2022 9:22 AM MRN: [id number] Account #: [id number] Date of Birth: October 27, 1950 Admit Type: Outpatient Age: 72 Room: [facility] OR ROOM 01 Gender: Female Note Status: Finalized Instrument Name: [id number] Procedure:             Colonoscopy Indications:           High risk colon cancer surveillance: Personal history                         of colonic polyps Providers:             Lucilla Lame MD, MD Medicines:             Propofol per Anesthesia Complications:         No immediate complications. Procedure:             Pre-Anesthesia Assessment:                        - Prior to the procedure, a History and Physical was                         performed, and patient medications and allergies were                         reviewed. The patient's tolerance of previous                         anesthesia was also reviewed. The risks and benefits                         of the procedure and the sedation options and risks                         were discussed with the patient. All questions were                         answered, and informed consent was obtained. Prior                         Anticoagulants: The patient has taken no anticoagulant                         or antiplatelet agents. ASA Grade Assessment: II - A                         patient with mild systemic disease. After reviewing                         the risks and benefits, the patient was deemed in                         satisfactory condition to undergo the procedure.                        After obtaining informed consent, the colonoscope was                         passed under direct vision. Throughout the  procedure,                         the patient's blood pressure, pulse, and oxygen                         saturations were monitored continuously. The                         Colonoscope was introduced through the anus and                          advanced to the the cecum, identified by appendiceal                         orifice and ileocecal valve. The colonoscopy was                         performed without difficulty. The patient tolerated                         the procedure well. The quality of the bowel                         preparation was good. Findings:      The perianal and digital rectal examinations were normal.      A 3 mm polyp was found in the cecum. The polyp was sessile. The polyp       was removed with a cold biopsy forceps. Resection and retrieval were       complete.      A 4 mm polyp was found in the descending colon. The polyp was sessile.       The polyp was removed with a cold snare. Resection and retrieval were       complete.      A 6 mm polyp was found in the sigmoid colon. The polyp was sessile. The       polyp was removed with a cold snare. Resection and retrieval were       complete. Impression:            - One 3 mm polyp in the cecum, removed with a cold                         biopsy forceps. Resected and retrieved.                        - One 4 mm polyp in the descending colon, removed with                         a cold snare. Resected and retrieved.                        - One 6 mm polyp in the sigmoid colon, removed with a                         cold snare. Resected and retrieved. Recommendation:        - Discharge patient to home.                        -  Resume previous diet.                        - Continue present medications.                        - Await pathology results.                        - Repeat colonoscopy in 5 years for surveillance. Procedure Code(s):     --- Professional ---                        [phone], Colonoscopy, flexible; with removal of                         tumor(s), polyp(s), or other lesion(s) by snare                         technique                        [id number], 40, Colonoscopy, flexible; with biopsy, single                          or multiple Diagnosis Code(s):     --- Professional ---                        Z86.010, Personal history of colonic polyps                        D12.4, Benign neoplasm of descending colon                        D12.5, Benign neoplasm of sigmoid colon CPT copyright 2022 American Medical Association. All rights reserved. The codes documented in this report are preliminary and upon coder review may  be revised to meet current compliance requirements. Lucilla Lame MD, MD 11/13/2022 9:53:14 AM This report has been signed electronically. Number of Addenda: 0 Note Initiated On: 11/13/2022 9:22 AM Scope Withdrawal Time: 0 hours 9 minutes 40 seconds  Total Procedure Duration: 0 hours 12 minutes 55 seconds  Estimated Blood Loss:  Estimated blood loss: none.      [facility]

## 2022-11-14 ENCOUNTER — Encounter: Payer: Self-pay | Admitting: Gastroenterology

## 2022-11-15 ENCOUNTER — Ambulatory Visit
Payer: Medicare Other | Attending: Student in an Organized Health Care Education/Training Program | Admitting: Student in an Organized Health Care Education/Training Program

## 2022-11-15 DIAGNOSIS — M12811 Other specific arthropathies, not elsewhere classified, right shoulder: Secondary | ICD-10-CM

## 2022-11-15 DIAGNOSIS — G894 Chronic pain syndrome: Secondary | ICD-10-CM | POA: Diagnosis not present

## 2022-11-15 DIAGNOSIS — Z9889 Other specified postprocedural states: Secondary | ICD-10-CM

## 2022-11-15 LAB — SURGICAL PATHOLOGY

## 2022-11-15 NOTE — Progress Notes (Signed)
Patient: Sara [name]  Service Category: E/M  Provider: Gillis Santa, MD  DOB: 06-Oct-1950  DOS: 11/15/2022  Location: Office  MRN: [id number]  Setting: Ambulatory outpatient  Referring Provider: Evern Bio, NP  Type: Established Patient  Specialty: Interventional Pain Management  PCP: Evern Bio, NP  Location: Remote location  Delivery: TeleHealth     Virtual Encounter - Pain Management PROVIDER NOTE: Information contained herein reflects review and annotations entered in association with encounter. Interpretation of such information and data should be left to medically-trained personnel. Information provided to patient can be located elsewhere in the medical record under "Patient Instructions". Document created using STT-dictation technology, any transcriptional errors that may result from process are unintentional.    Contact & Pharmacy Preferred: [phone] Home: [phone] (home) Mobile: [phone] (mobile) E-mail: [email or web address]  [place], [place] [address] Phone: [phone] Fax: [phone]  [id number] - [place], [place] - [address] [address] Phone: [phone] Fax: [phone]   Pre-screening  [name] offered "in-person" vs "virtual" encounter. She indicated preferring virtual for this encounter.   Reason COVID-19*  Social distancing based on CDC and AMA recommendations.   I contacted [name] on 11/15/2022 via telephone.      I clearly identified myself as Gillis Santa, MD. I verified that I was speaking with the correct person using two identifiers (Name: Sara [name], and date of birth: [birth year]).  Consent I sought verbal advanced consent from [name] for virtual visit interactions. I informed [name] of possible security and privacy concerns, risks, and limitations associated with providing "not-in-person" medical  evaluation and management services. I also informed [name] of the availability of "in-person" appointments. Finally, I informed her that there would be a charge for the virtual visit and that she could be  personally, fully or partially, financially responsible for it. Ms. Fratello expressed understanding and agreed to proceed.   Historic Elements   [name] is a 72 y.o. year old, female patient evaluated today after our last contact on 10/18/2022. [name]  has a past medical history of Arthritis, Depression, Dyspnea, Employs prosthetic leg, GERD (gastroesophageal reflux disease), History of blood clots, Hyperlipidemia, Hypertension, MGUS (monoclonal gammopathy of unknown significance) (11/2021), Mild mitral regurgitation, Osteoporosis, Peripheral vascular disease ([place]), Stomach ulcer, Vascular disease, Vertigo, and Wears dentures. She also  has a past surgical history that includes Vascular surgery [id number]); Hemorroidectomy (2014); Esophagogastroduodenoscopy (N/A, 12/21/2014); Esophagogastroduodenoscopy (egd) with propofol (N/A, 02/08/2016); Cardiac catheterization (N/A, 02/09/2016); Cardiac catheterization (Right, 02/10/2016); Cardiac catheterization (02/15/2017); Colonoscopy with propofol (N/A, 10/22/2017); Colonoscopy with propofol (N/A, 11/05/2017); polypectomy (11/05/2017); Tonsillectomy; Adenoidectomy; Lower Extremity Angiography (Right, 05/30/2019); Fasciotomy closure (Right, 06/04/2019); Artery Biopsy (Right, 07/14/2019); Fasciotomy (Right, 05/30/2019); Breast cyst aspiration (Left); Shoulder arthroscopy with rotator cuff repair and subacromial decompression (Right, 02/27/2020); Lower Extremity Angiography (Right, 07/02/2020); Amputation (Right, 08/11/2020); Colonoscopy with propofol (N/A, 11/13/2022); and polypectomy (11/13/2022). [name] has a current medication list which includes the following prescription(s): apixaban, atorvastatin, calcium carb-cholecalciferol, clonazepam, digoxin,  duloxetine, gabapentin, ivabradine, losartan, omeprazole, actemra actpen, triamcinolone, and zolpidem. She  reports that she quit smoking about 31 years ago. Her smoking use included cigarettes. She has a 50.00 pack-year smoking history. She has never used smokeless tobacco. She reports that she does not drink alcohol and does not use drugs. [name] is allergic to hydrocodone-acetaminophen, amoxicillin, diltiazem, metoprolol tartrate, oxycodone, and vicodin [hydrocodone-acetaminophen].  BMI: Estimated  body mass index is 22.14 kg/m as calculated from the following:   Height as of 11/02/22: 5\' 3"  (1.6 m).   Weight as of 11/13/22: 125 lb (56.7 kg). Last encounter: 10/02/2022. Last procedure: 10/18/2022.  HPI  Today, she is being contacted for a post-procedure assessment.   Post-procedure evaluation   Procedure: Suprascapular nerve block (SSNB) #1  Laterality:  Right  Level: Superior to scapular spine, lateral to supraspinatus fossa (Suprascapular notch).  Imaging: Fluoroscopic guidance         Anesthesia: Local anesthesia (1-2% Lidocaine) DOS: 10/18/2022  Performed by: Gillis Santa, MD  Purpose: Diagnostic/Therapeutic Indications: Shoulder pain, severe enough to impact quality of life and/or function. 1. Rotator cuff arthropathy of right shoulder   2. Hx of rotator cuff surgery   3. Chronic pain syndrome    NAS-11 score:   Pre-procedure: 0-No pain/10   Post-procedure: 0-No pain/10      Effectiveness:  Initial hour after procedure: 100 %  Subsequent 4-6 hours post-procedure: 100 %  Analgesia past initial 6 hours: 0 %  Ongoing improvement:  Analgesic:  0% Function: Back to baseline ROM: Back to baseline   Laboratory Chemistry Profile   Renal Lab Results  Component Value Date   BUN 11 10/09/2022   CREATININE 0.74 10/09/2022   BCR 18 10/02/2022   GFRAA >60 02/18/2020   GFRNONAA >60 10/09/2022    Hepatic Lab Results  Component Value Date   AST 50 (H) 10/09/2022   ALT  50 (H) 10/09/2022   ALBUMIN 4.0 10/09/2022   ALKPHOS 65 10/09/2022   LIPASE 48 10/09/2022    Electrolytes Lab Results  Component Value Date   NA 139 10/09/2022   K 4.1 10/09/2022   CL 106 10/09/2022   CALCIUM 8.9 10/09/2022   MG 1.9 07/03/2020   PHOS 3.6 06/02/2019    Bone Lab Results  Component Value Date   VD25OH 45.8 09/21/2015    Inflammation (CRP: Acute Phase) (ESR: Chronic Phase) Lab Results  Component Value Date   ESRSEDRATE 58 (H) 07/03/2019   LATICACIDVEN 0.8 06/01/2019         Note: Above Lab results reviewed.  Assessment  The primary encounter diagnosis was Rotator cuff arthropathy of right shoulder. Diagnoses of Hx of rotator cuff surgery and Chronic pain syndrome were also pertinent to this visit.  Plan of Care  Unfortunately no significant benefit with diagnostic suprascapular nerve block.  I do not recommend a suprascapular nerve ablation at this time.  She states that she is getting a new wheelchair that is supposed to be lighter.  We also discussed ergonomic strategies that she could implement to put less pressure on her shoulder when she is in her wheelchair.  She can also consider Tylenol with arthritis.  I advised against any NSAIDs given that she is on an anticoagulant.  Patient will follow-up as needed.  Follow-up plan:   Return if symptoms worsen or fail to improve.      1.  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  Discussed treatment options which include titration of gabapentin to a more therapeutic dose.  Can also consider trial of Lyrica in future.  2.  Consider lidocaine infusion for right lower extremity phantom limb pain.  QTc checked from EKG in November 2022 and is appropriate.  Will need updated EKG if we are considering lidocaine infusion.  3.  Consider spinal cord stimulation for phantom limb pain.  Informed patient that there is mixed evidence on this but this  could be a treatment option.      Recent  Visits Date Type Provider Dept  10/18/22 Procedure visit Gillis Santa, MD [facility]  10/02/22 Office Visit Gillis Santa, MD [facility]  09/04/22 Procedure visit Gillis Santa, MD [facility]  08/17/22 Office Visit Gillis Santa, MD [facility]  Showing recent visits within past 90 days and meeting all other requirements Today's Visits Date Type Provider Dept  11/15/22 Office Visit Gillis Santa, MD [facility]  Showing today's visits and meeting all other requirements Future Appointments No visits were found meeting these conditions. Showing future appointments within next 90 days and meeting all other requirements  I discussed the assessment and treatment plan with the patient. The patient was provided an opportunity to ask questions and all were answered. The patient agreed with the plan and demonstrated an understanding of the instructions.  Patient advised to call back or seek an in-person evaluation if the symptoms or condition worsens.  Duration of encounter: 95minutes.  Note by: Gillis Santa, MD Date: 11/15/2022; Time: 3:30 PM

## 2022-11-16 ENCOUNTER — Encounter: Payer: Self-pay | Admitting: Gastroenterology

## 2022-11-19 DIAGNOSIS — E785 Hyperlipidemia, unspecified: Secondary | ICD-10-CM | POA: Diagnosis not present

## 2022-11-19 DIAGNOSIS — I10 Essential (primary) hypertension: Secondary | ICD-10-CM | POA: Diagnosis not present

## 2022-11-19 DIAGNOSIS — I2510 Atherosclerotic heart disease of native coronary artery without angina pectoris: Secondary | ICD-10-CM | POA: Diagnosis not present

## 2022-11-24 ENCOUNTER — Ambulatory Visit (INDEPENDENT_AMBULATORY_CARE_PROVIDER_SITE_OTHER): Payer: Self-pay | Admitting: Cardiovascular Disease

## 2022-11-24 ENCOUNTER — Encounter: Payer: Self-pay | Admitting: Cardiovascular Disease

## 2022-11-24 VITALS — BP 122/60 | HR 75 | Ht 63.0 in | Wt 135.6 lb

## 2022-11-24 DIAGNOSIS — I4711 Inappropriate sinus tachycardia, so stated: Secondary | ICD-10-CM | POA: Diagnosis not present

## 2022-11-24 DIAGNOSIS — R000 Tachycardia, unspecified: Secondary | ICD-10-CM | POA: Diagnosis not present

## 2022-11-24 DIAGNOSIS — E782 Mixed hyperlipidemia: Secondary | ICD-10-CM

## 2022-11-24 DIAGNOSIS — I2510 Atherosclerotic heart disease of native coronary artery without angina pectoris: Secondary | ICD-10-CM | POA: Diagnosis not present

## 2022-11-24 NOTE — Assessment & Plan Note (Signed)
Patient doing well. No complaints today. Received Corlanor from the [place]. HR improved. Continue same medications.

## 2022-11-24 NOTE — Progress Notes (Signed)
Cardiology Office Note   Date:  11/24/2022   ID:  Sara [name], Virgil [birth year], MRN [id number]  PCP:  Orson Eva, NP  Cardiologist:  Adrian Blackwater, MD      History of Present Illness: Sara [name] is a 72 y.o. female who presents for  Chief Complaint  Patient presents with   Follow-up    4 week follow up    Patient in office for 1 month follow up. Denies chest pain, shortness of breath.     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 Gilda Crease   Vertigo    Last episode approx Aug 2015   Wears dentures    full upper     Past Surgical History:  Procedure Laterality Date   ADENOIDECTOMY     AMPUTATION Right 08/11/2020   Procedure: AMPUTATION ABOVE KNEE;  Surgeon: Renford Dills, MD;  Location: [facility];  Service: Vascular;  Laterality: Right;   ARTERY BIOPSY Right 07/14/2019   Procedure: BIOPSY TEMPORAL ARTERY;  Surgeon: Renford Dills, MD;  Location: [facility];  Service: Vascular;  Laterality: Right;   BREAST CYST ASPIRATION Left    CARDIAC CATHETERIZATION  02/15/2017   [facility]   COLONOSCOPY WITH PROPOFOL N/A 10/22/2017   Procedure: COLONOSCOPY WITH PROPOFOL;  Surgeon: Midge Minium, MD;  Location: [facility];  Service: Endoscopy;  Laterality: N/A;  specimens not taken--pt on Plavix will be brought back in after 7 days off med   COLONOSCOPY WITH PROPOFOL N/A 11/05/2017   Procedure: COLONOSCOPY WITH PROPOFOL;  Surgeon: Midge Minium, MD;  Location: [facility];  Service: Endoscopy;  Laterality: N/A;   COLONOSCOPY WITH PROPOFOL N/A 11/13/2022   Procedure:  COLONOSCOPY WITH PROPOFOL;  Surgeon: Midge Minium, MD;  Location: [facility];  Service: Endoscopy;  Laterality: N/A;   ESOPHAGOGASTRODUODENOSCOPY N/A 12/21/2014   Procedure: ESOPHAGOGASTRODUODENOSCOPY (EGD);  Surgeon: Midge Minium, MD;  Location: [facility];  Service: Gastroenterology;  Laterality: N/A;   ESOPHAGOGASTRODUODENOSCOPY (EGD) WITH PROPOFOL N/A 02/08/2016   Procedure: ESOPHAGOGASTRODUODENOSCOPY (EGD) WITH PROPOFOL;  Surgeon: Midge Minium, MD;  Location: [facility];  Service: Endoscopy;  Laterality: N/A;   FASCIOTOMY Right 05/30/2019   Procedure: FASCIOTOMY;  Surgeon: Renford Dills, MD;  Location: [facility];  Service: Vascular;  Laterality: Right;   FASCIOTOMY CLOSURE Right 06/04/2019   Procedure: FASCIOTOMY CLOSURE;  Surgeon: Renford Dills, MD;  Location: [facility];  Service: Vascular;  Laterality: Right;   HEMORROIDECTOMY  2014   LOWER EXTREMITY ANGIOGRAPHY Right 05/30/2019   Procedure: LOWER EXTREMITY ANGIOGRAPHY;  Surgeon: Renford Dills, MD;  Location: [facility];  Service: Cardiovascular;  Laterality: Right;   LOWER EXTREMITY ANGIOGRAPHY Right 07/02/2020   Procedure: LOWER EXTREMITY ANGIOGRAPHY;  Surgeon: Renford Dills, MD;  Location: [facility];  Service: Cardiovascular;  Laterality: Right;   PERIPHERAL VASCULAR CATHETERIZATION N/A 02/09/2016   Procedure: Abdominal Aortogram w/Lower Extremity;  Surgeon: Renford Dills, MD;  Location: [facility];  Service: Cardiovascular;  Laterality: N/A;   PERIPHERAL VASCULAR CATHETERIZATION Right 02/10/2016   Procedure: Lower Extremity Angiography;  Surgeon: Renford DillsGregory G Schnier, MD;  Location: [facility];  Service: Cardiovascular;  Laterality: Right;   POLYPECTOMY  11/05/2017   Procedure: POLYPECTOMY;  Surgeon: Midge MiniumWohl, Darren, MD;  [facility];  Service: Endoscopy;;   POLYPECTOMY  11/13/2022   Procedure: POLYPECTOMY;  Surgeon: Midge MiniumWohl, Darren, MD;  [facility];  Service: Endoscopy;;   SHOULDER ARTHROSCOPY WITH ROTATOR CUFF REPAIR AND SUBACROMIAL DECOMPRESSION Right 02/27/2020   Procedure: RIGHT SHOULDER ARTHROSCOPY SUBACROMIAL DECOMPRESSION, DISTAL CLAVICLE EXCISION AND MINI-OPEN ROTATOR CUFF REPAIR;  Surgeon: Juanell FairlyKrasinski, Kevin, MD;  Location: [facility];  Service: Orthopedics;  Laterality: Right;   TONSILLECTOMY     VASCULAR SURGERY  [id number]   Fem-Pop Bypass     Current Outpatient Medications  Medication Sig Dispense Refill   apixaban (ELIQUIS) 2.5 MG TABS tablet Take 2.5 mg by mouth 2 (two) times daily.     atorvastatin (LIPITOR) 40 MG tablet Take 40 mg by mouth at bedtime.      Calcium Carb-Cholecalciferol 600-10 MG-MCG TABS Take 1 tablet by mouth daily.     clonazePAM (KLONOPIN) 0.5 MG tablet Take 0.25-0.5 mg by mouth daily as needed.     digoxin (LANOXIN) 0.125 MG tablet Take 125 mcg by mouth daily.     DULoxetine (CYMBALTA) 60 MG capsule TAKE 1 CAPSULE BY MOUTH TWICE A DAY 180 capsule 3   gabapentin (NEURONTIN) 800 MG tablet Take 1 tablet (800 mg total) by mouth 3 (three) times daily. 90 tablet 0   ivabradine (CORLANOR) 7.5 MG TABS tablet Take 1 tablet (7.5 mg total) by mouth 2 (two) times daily with a meal. 60 tablet 3   losartan (COZAAR) 25 MG tablet Take 25 mg by mouth every morning.      omeprazole (PRILOSEC) 40 MG capsule Take 40 mg by mouth daily as needed.     Tocilizumab (ACTEMRA ACTPEN) 162 MG/0.9ML SOAJ Inject into the skin.     triamcinolone (KENALOG) 0.025 % cream Apply 1 Application topically at bedtime.     zolpidem (AMBIEN) 10 MG tablet TAKE 1 TABLET BY MOUTH NIGHTLY AT BEDTIME AS NEEDED FOR INSOMNIA 30 tablet 1   No current facility-administered medications for this visit.    Allergies:   Hydrocodone-acetaminophen, Amoxicillin, Diltiazem, Metoprolol tartrate, Oxycodone, and Vicodin [hydrocodone-acetaminophen]    Social History:   reports that she quit smoking about 31 years ago. Her smoking use included  cigarettes. She has a 50.00 pack-year smoking history. She has never used smokeless tobacco. She reports that she does not drink alcohol and does not use drugs.   Family History:  family history includes CVA in her mother; Cancer in her father; Colon cancer in her father; Heart attack in her mother; Heart disease in her maternal uncle.    ROS:     Review of Systems  Constitutional: Negative.   HENT: Negative.    Eyes: Negative.   Respiratory: Negative.    Cardiovascular: Negative.   Gastrointestinal: Negative.   Genitourinary: Negative.   Musculoskeletal: Negative.   Skin: Negative.   Neurological: Negative.   Endo/Heme/Allergies: Negative.   Psychiatric/Behavioral: Negative.    All other systems reviewed and are negative.   All other systems are reviewed and negative.   PHYSICAL EXAM: VS:  BP 122/60   Pulse 75   Ht 5\' 3"  (1.6 m)   Wt 135 lb 9.6 oz (61.5 kg)   SpO2 97%  BMI 24.02 kg/m  , BMI Body mass index is 24.02 kg/m. Last weight:  Wt Readings from Last 3 Encounters:  11/24/22 135 lb 9.6 oz (61.5 kg)  11/13/22 125 lb (56.7 kg)  10/24/22 136 lb 6.4 oz (61.9 kg)    Physical Exam Constitutional:      Appearance: Normal appearance.  Cardiovascular:     Rate and Rhythm: Normal rate and regular rhythm.     Heart sounds: Normal heart sounds.  Pulmonary:     Effort: Pulmonary effort is normal.     Breath sounds: Normal breath sounds.  Musculoskeletal:     Right lower leg: No edema.     Left lower leg: No edema.  Neurological:     Mental Status: She is alert.     EKG: none today  Recent Labs: 10/09/2022: ALT 50; BUN 11; Creatinine, Ser 0.74; Hemoglobin 14.8; Platelets 197; Potassium 4.1; Sodium 139    Lipid Panel    Component Value Date/Time   CHOL 117 10/02/2022 1044   TRIG 79 10/02/2022 1044   HDL 55 10/02/2022 1044   CHOLHDL 2.1 10/02/2022 1044   LDLCALC 46 10/02/2022 1044     Other studies Reviewed: none today   ASSESSMENT AND PLAN:     ICD-10-CM   1. 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 Coronary artery disease involving native coronary artery of native heart without angina pectoris  I25.10     3. Mixed hyperlipidemia  E78.2     4. Sinus tachycardia  R00.0        Problem List Items Addressed This Visit       Cardiovascular and Mediastinum   CAD (coronary artery disease)   Inappropriate sinus tachycardia - Primary    Patient doing well. No complaints today. [facility] [place] improved. Continue same medications.         Other   Hyperlipidemia   Sinus tachycardia     Disposition:   Return in about 4 months (around 03/26/2023).    Total time spent: 30 minutes  Signed,  Adrian BlackwaterShaukat Travelle Mcclimans, MD  11/24/2022 10:53 AM    [facility]

## 2022-12-04 DIAGNOSIS — M899 Disorder of bone, unspecified: Secondary | ICD-10-CM | POA: Diagnosis not present

## 2022-12-04 DIAGNOSIS — M4856XA Collapsed vertebra, not elsewhere classified, lumbar region, initial encounter for fracture: Secondary | ICD-10-CM | POA: Diagnosis not present

## 2022-12-04 DIAGNOSIS — M4854XA Collapsed vertebra, not elsewhere classified, thoracic region, initial encounter for fracture: Secondary | ICD-10-CM | POA: Diagnosis not present

## 2022-12-04 DIAGNOSIS — D472 Monoclonal gammopathy: Secondary | ICD-10-CM | POA: Diagnosis not present

## 2022-12-06 ENCOUNTER — Inpatient Hospital Stay: Payer: Medicare Other

## 2022-12-08 ENCOUNTER — Inpatient Hospital Stay: Payer: Medicare Other | Admitting: Medical Oncology

## 2022-12-29 ENCOUNTER — Other Ambulatory Visit: Payer: Self-pay | Admitting: Nurse Practitioner

## 2022-12-29 ENCOUNTER — Other Ambulatory Visit: Payer: 59

## 2022-12-29 DIAGNOSIS — E663 Overweight: Secondary | ICD-10-CM

## 2022-12-29 DIAGNOSIS — R5383 Other fatigue: Secondary | ICD-10-CM

## 2022-12-29 DIAGNOSIS — R7301 Impaired fasting glucose: Secondary | ICD-10-CM

## 2022-12-30 LAB — LIPID PANEL
Chol/HDL Ratio: 2 ratio (ref 0.0–4.4)
Cholesterol, Total: 110 mg/dL (ref 100–199)
HDL: 56 mg/dL (ref >39)
LDL Chol Calc (NIH): 39 mg/dL (ref 0–99)
Triglycerides: 71 mg/dL (ref 0–149)
VLDL Cholesterol Cal: 15 mg/dL (ref 5–40)

## 2022-12-30 LAB — CMP14+EGFR
ALT: 53 IU/L — ABNORMAL HIGH (ref 0–32)
AST: 47 IU/L — ABNORMAL HIGH (ref 0–40)
Albumin/Globulin Ratio: 2.2 (ref 1.2–2.2)
Albumin: 4.3 g/dL (ref 3.9–4.9)
Alkaline Phosphatase: 75 IU/L (ref 44–121)
BUN/Creatinine Ratio: 9 — ABNORMAL LOW (ref 12–28)
BUN: 8 mg/dL (ref 8–27)
Bilirubin Total: 0.4 mg/dL (ref 0.0–1.2)
CO2: 22 mmol/L (ref 20–29)
Calcium: 10 mg/dL (ref 8.7–10.3)
Chloride: 105 mmol/L (ref 96–106)
Creatinine, Ser: 0.86 mg/dL (ref 0.57–1.00)
Globulin, Total: 2 g/dL (ref 1.5–4.5)
Glucose: 102 mg/dL — ABNORMAL HIGH (ref 70–99)
Potassium: 4.8 mmol/L (ref 3.5–5.2)
Sodium: 143 mmol/L (ref 134–144)
Total Protein: 6.3 g/dL (ref 6.0–8.5)
eGFR: 73 mL/min/{1.73_m2} (ref >59)

## 2022-12-30 LAB — HEMOGLOBIN A1C
Est. average glucose Bld gHb Est-mCnc: 134 mg/dL
Hgb A1c MFr Bld: 6.3 % — ABNORMAL HIGH (ref 4.8–5.6)

## 2022-12-30 LAB — TSH: TSH: 2.29 u[IU]/mL (ref 0.450–4.500)

## 2023-01-01 ENCOUNTER — Ambulatory Visit (INDEPENDENT_AMBULATORY_CARE_PROVIDER_SITE_OTHER): Payer: Medicare PPO | Admitting: Nurse Practitioner

## 2023-01-01 ENCOUNTER — Encounter: Payer: Self-pay | Admitting: Nurse Practitioner

## 2023-01-01 VITALS — BP 148/88 | HR 72 | Ht 63.0 in | Wt 130.0 lb

## 2023-01-01 DIAGNOSIS — I2510 Atherosclerotic heart disease of native coronary artery without angina pectoris: Secondary | ICD-10-CM

## 2023-01-01 DIAGNOSIS — R7303 Prediabetes: Secondary | ICD-10-CM

## 2023-01-01 DIAGNOSIS — R748 Abnormal levels of other serum enzymes: Secondary | ICD-10-CM | POA: Insufficient documentation

## 2023-01-01 DIAGNOSIS — K219 Gastro-esophageal reflux disease without esophagitis: Secondary | ICD-10-CM | POA: Diagnosis not present

## 2023-01-01 NOTE — Progress Notes (Signed)
Established Patient Office Visit  Subjective:  Patient ID: Sara [name], female    DOB: 10/18/1950  Age: 72 y.o. MRN: [id number]  Chief Complaint  Patient presents with   Follow-up    3 month follow up, discuss lab results.    4 month follow up and review of recent fasting labs.  A1c at 6.3% and LDL is at goal, liver enzymes slightly elevated and will recheck in 1 month, nonfasting.      No other concerns at this time.   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 Gilda Crease   Vertigo    Last episode approx Aug 2015   Wears dentures    full upper    Past Surgical History:  Procedure Laterality Date   ADENOIDECTOMY     AMPUTATION Right 08/11/2020   Procedure: AMPUTATION ABOVE KNEE;  Surgeon: Renford Dills, MD;  Location: [facility];  Service: Vascular;  Laterality: Right;   ARTERY BIOPSY Right 07/14/2019   Procedure: BIOPSY TEMPORAL ARTERY;  Surgeon: Renford Dills, MD;  Location: [facility];  Service: Vascular;  Laterality: Right;   BREAST CYST ASPIRATION Left    CARDIAC CATHETERIZATION  02/15/2017   [facility]   COLONOSCOPY WITH PROPOFOL N/A 10/22/2017   Procedure: COLONOSCOPY WITH PROPOFOL;  Surgeon: Midge Minium, MD;  Location: [facility];  Service: Endoscopy;  Laterality: N/A;  specimens not taken--pt on Plavix will be brought back in after 7 days off med   COLONOSCOPY WITH PROPOFOL N/A 11/05/2017   Procedure: COLONOSCOPY WITH PROPOFOL;  Surgeon: Midge Minium, MD;  Location: [facility];  Service: Endoscopy;  Laterality: N/A;   COLONOSCOPY WITH PROPOFOL N/A 11/13/2022    Procedure: COLONOSCOPY WITH PROPOFOL;  Surgeon: Midge Minium, MD;  Location: [facility];  Service: Endoscopy;  Laterality: N/A;   ESOPHAGOGASTRODUODENOSCOPY N/A 12/21/2014   Procedure: ESOPHAGOGASTRODUODENOSCOPY (EGD);  Surgeon: Midge Minium, MD;  Location: [facility];  Service: Gastroenterology;  Laterality: N/A;   ESOPHAGOGASTRODUODENOSCOPY (EGD) WITH PROPOFOL N/A 02/08/2016   Procedure: ESOPHAGOGASTRODUODENOSCOPY (EGD) WITH PROPOFOL;  Surgeon: Midge Minium, MD;  Location: [facility];  Service: Endoscopy;  Laterality: N/A;   FASCIOTOMY Right 05/30/2019   Procedure: FASCIOTOMY;  Surgeon: Renford Dills, MD;  Location: [facility];  Service: Vascular;  Laterality: Right;   FASCIOTOMY CLOSURE Right 06/04/2019   Procedure: FASCIOTOMY CLOSURE;  Surgeon: Renford Dills, MD;  Location: [facility];  Service: Vascular;  Laterality: Right;   HEMORROIDECTOMY  2014   LOWER EXTREMITY ANGIOGRAPHY Right 05/30/2019   Procedure: LOWER EXTREMITY ANGIOGRAPHY;  Surgeon: Renford Dills, MD;  Location: [facility];  Service: Cardiovascular;  Laterality: Right;   LOWER EXTREMITY ANGIOGRAPHY Right 07/02/2020   Procedure: LOWER EXTREMITY ANGIOGRAPHY;  Surgeon: Renford Dills, MD;  Location: [facility];  Service: Cardiovascular;  Laterality: Right;   PERIPHERAL VASCULAR CATHETERIZATION N/A 02/09/2016   Procedure: Abdominal Aortogram w/Lower Extremity;  Surgeon: Renford Dills, MD;  Location: [facility];  Service: Cardiovascular;  Laterality: N/A;   PERIPHERAL VASCULAR CATHETERIZATION Right 02/10/2016   Procedure: Lower Extremity Angiography;  Surgeon: Renford Dills, MD;  Location: [facility];  Service: Cardiovascular;  Laterality: Right;   POLYPECTOMY  11/05/2017   Procedure: POLYPECTOMY;  Surgeon: Midge Minium, MD;  Location: [facility];  Service: Endoscopy;;   POLYPECTOMY  11/13/2022   Procedure: POLYPECTOMY;  Surgeon: Midge Minium, MD;   Location: [facility];  Service: Endoscopy;;   SHOULDER ARTHROSCOPY WITH ROTATOR CUFF REPAIR AND SUBACROMIAL DECOMPRESSION Right 02/27/2020   Procedure: RIGHT SHOULDER ARTHROSCOPY SUBACROMIAL DECOMPRESSION, DISTAL CLAVICLE EXCISION AND MINI-OPEN ROTATOR CUFF REPAIR;  Surgeon: Juanell Fairly, MD;  Location: [facility];  Service: Orthopedics;  Laterality: Right;   TONSILLECTOMY     VASCULAR SURGERY  [id number]   Fem-Pop Bypass    Social History   Socioeconomic History   Marital status: Single    Spouse name: Not on file   Number of children: Not on file   Years of education: Not on file   Highest education level: Not on file  Occupational History   Not on file  Tobacco Use   Smoking status: Former    Packs/day: 2.00    Years: 25.00    Additional pack years: 0.00    Total pack years: 50.00    Types: Cigarettes    Quit date: 08/22/1991    Years since quitting: 31.3   Smokeless tobacco: Never  Vaping Use   Vaping Use: Never used  Substance and Sexual Activity   Alcohol use: No    Alcohol/week: 0.0 standard drinks of alcohol   Drug use: Never   Sexual activity: Not Currently  Other Topics Concern   Not on file  Social History Narrative   Not on file   Social Determinants of Health   Financial Resource Strain: Not on file  Food Insecurity: Not on file  Transportation Needs: Not on file  Physical Activity: Not on file  Stress: Not on file  Social Connections: Not on file  Intimate Partner Violence: Not on file    Family History  Problem Relation Age of Onset   CVA Mother    Heart attack Mother    Cancer Father        colon cancer   Colon cancer Father    Heart disease Maternal Uncle    Breast cancer Neg Hx     Allergies  Allergen Reactions   Hydrocodone-Acetaminophen     Other reaction(s): Flushing   Amoxicillin Other (See Comments)    Yeast infection   Diltiazem Other (See Comments)    headache   Metoprolol Tartrate Rash   Oxycodone Itching    Vicodin [Hydrocodone-Acetaminophen] Hives and Rash    Flushing    Review of Systems  Constitutional: Negative.   HENT: Negative.    Eyes: Negative.   Respiratory: Negative.    Cardiovascular: Negative.   Gastrointestinal:  Positive for abdominal pain and heartburn.  Genitourinary: Negative.   Musculoskeletal:  Positive for myalgias.  Skin: Negative.   Neurological: Negative.   Endo/Heme/Allergies: Negative.   Psychiatric/Behavioral:  The patient is nervous/anxious.        Objective:   BP (!) 148/88   Pulse 72   Ht 5\' 3"  (1.6 m)   Wt 130 lb (59 kg)   SpO2 94%   BMI 23.03 kg/m   Vitals:   01/01/23 1003  BP: (!) 148/88  Pulse: 72  Height: 5\' 3"  (1.6 m)  Weight: 130 lb (59 kg)  SpO2: 94%  BMI (Calculated): 23.03    Physical Exam Vitals reviewed.  Constitutional:      Appearance: Normal appearance.  HENT:     Head: Normocephalic.     Nose: Nose normal.     Mouth/Throat:     Mouth: Mucous membranes are moist.  Eyes:     Pupils: Pupils are equal, round, and reactive to light.  Cardiovascular:     Rate and Rhythm: Normal rate and regular rhythm.  Pulmonary:     Effort: Pulmonary effort is normal.     Breath sounds: Normal breath sounds.  Abdominal:     General: Bowel sounds are normal.     Palpations: Abdomen is soft.  Musculoskeletal:        General: Normal range of motion.     Cervical back: Normal range of motion and neck supple.     Comments: Right AKA  Skin:    General: Skin is warm and dry.  Neurological:     Mental Status: She is alert and oriented to person, place, and time.  Psychiatric:        Mood and Affect: Mood normal.        Behavior: Behavior normal.      No results found for any visits on 01/01/23.  Recent Results (from the past 2160 hour(s))  Lipase, blood     Status: None   Collection Time: 10/09/22  2:26 PM  Result Value Ref Range   Lipase 48 11 - 51 U/L    Comment: Performed at [facility], [address]  [place], [place] [address]  Comprehensive metabolic panel     Status: Abnormal   Collection Time: 10/09/22  2:26 PM  Result Value Ref Range   Sodium 139 135 - 145 mmol/L   Potassium 4.1 3.5 - 5.1 mmol/L   Chloride 106 98 - 111 mmol/L   CO2 26 22 - 32 mmol/L   Glucose, Bld 99 70 - 99 mg/dL    Comment: Glucose reference range applies only to samples taken after fasting for at least 8 hours.   BUN 11 8 - 23 mg/dL   Creatinine, Ser 6.04 0.44 - 1.00 mg/dL   Calcium 8.9 8.9 - 54.0 mg/dL   Total Protein 6.7 6.5 - 8.1 g/dL   Albumin 4.0 3.5 - 5.0 g/dL   AST 50 (H) 15 - 41 U/L   ALT 50 (H) 0 - 44 U/L   Alkaline Phosphatase 65 38 - 126 U/L   Total Bilirubin 0.8 0.3 - 1.2 mg/dL   GFR, Estimated >98 >11 mL/min    Comment: (NOTE) Calculated using the CKD-EPI Creatinine Equation (2021)    Anion gap 7 5 - 15    Comment: Performed at [facility], [address] [place], [place] [address]  CBC     Status: None   Collection Time: 10/09/22  2:26 PM  Result Value Ref Range   WBC 8.1 4.0 - 10.5 K/uL   RBC 4.76 3.87 - 5.11 MIL/uL   Hemoglobin 14.8 12.0 - 15.0 g/dL   HCT 29.5 62.1 - 30.8 %   MCV 95.8 80.0 - 100.0 fL   MCH 31.1 26.0 - 34.0 pg   MCHC 32.5 30.0 - 36.0 g/dL   RDW 65.7 84.6 - 96.2 %   Platelets 197 150 - 400 K/uL   nRBC 0.0 0.0 - 0.2 %    Comment: Performed at [facility], [address], [place], [place] [address]  Urinalysis, Routine  w reflex microscopic -Urine, Clean Catch     Status: Abnormal   Collection Time: 10/09/22  2:26 PM  Result Value Ref Range   Color, Urine YELLOW (A) YELLOW   APPearance HAZY (A) CLEAR   Specific Gravity, Urine 1.016 1.005 - 1.030   pH 6.0 5.0 - 8.0   Glucose, UA NEGATIVE NEGATIVE mg/dL   Hgb urine dipstick NEGATIVE NEGATIVE   Bilirubin Urine NEGATIVE NEGATIVE   Ketones, ur NEGATIVE NEGATIVE mg/dL   Protein, ur NEGATIVE NEGATIVE mg/dL   Nitrite NEGATIVE NEGATIVE   Leukocytes,Ua NEGATIVE NEGATIVE    Comment: Performed at  [facility], [address], [place], [place] [address]  Surgical pathology     Status: None   Collection Time: 11/13/22  9:21 AM  Result Value Ref Range   SURGICAL PATHOLOGY      SURGICAL PATHOLOGY CASE: [id number] PATIENT: Clemencia Course Surgical Pathology Report     Specimen Submitted: A. Colon polyp, cecum; bx B. Colon polyp, descending; cold snare C. Colon polyp, sigmoid; cold snare  Clinical History: Personal history of colon polyps.  Colon polyps    DIAGNOSIS: A.  COLON POLYP, CECUM; BIOPSY: - TUBULAR ADENOMA. - NEGATIVE FOR HIGH-GRADE DYSPLASIA AND MALIGNANCY.  B. COLON POLYP, DESCENDING; COLD SNARE: - TUBULAR ADENOMA. - NEGATIVE FOR HIGH-GRADE DYSPLASIA AND MALIGNANCY.  C. COLON POLYP, SIGMOID; COLD SNARE: - HYPERPLASTIC POLYP. - NEGATIVE FOR DYSPLASIA AND MALIGNANCY.  GROSS DESCRIPTION: A. Labeled: Cecal polyp biopsy Received: Formalin Collection time: 9:21 AM on 11/13/2022 Placed into formalin time: 9:21 AM on 11/13/2022 Tissue fragment(s): 1 Size: 0.4 x 0.3 x 0.1 cm Description: Tan soft tissue fragment Entirely submitted in 1 cassette.  B. Labeled: Descending colon polyp cold snare Received: Formalin Collect ion time: 9:44 AM on 11/13/2022 Placed into formalin time: 9:44 AM on 11/13/2022 Tissue fragment(s): Multiple Size: Aggregate, 0.9 x 0.4 x 0.1 cm Description: Received are at least 3 fragments of tan soft tissue admixed with intestinal debris.  The ratio of soft tissue to intestinal debris is 80: 20. Entirely submitted in 1 cassette.  C. Labeled: Sigmoid colon polyp cold snare Received: Formalin Collection time: 9:49 AM on 11/13/2022 Placed into formalin time: 9:49 AM on 11/13/2022 Tissue fragment(s): Multiple Size: Aggregate, 1 x 0.6 x 0.5 cm Description: Received is at least 1 fragment of tan soft tissue admixed with intestinal debris.  The ratio of soft tissue to intestinal debris is 75: 25.  The soft tissue fragment has a  resection margin which is inked green.  This fragment is serially sectioned. Entirely submitted in 1 cassette.  RB 11/14/2022  Final Diagnosis performed by Katherine Mantle, MD.   Electronically signed 11/15/2022 10:24:16AM The electronic signature i ndicates that the named Attending Pathologist has evaluated the specimen Technical component performed at [facility], [address] [place], [place] [address] Lab: [phone] Dir: Jolene Schimke, MD, MMM  Professional component performed at [facility], [address] [place], [place], [place] [address] Lab: [phone] Dir: Beryle Quant, MD   Hemoglobin A1c     Status: Abnormal   Collection Time: 12/29/22 12:04 PM  Result Value Ref Range   Hgb A1c MFr Bld 6.3 (H) 4.8 - 5.6 %    Comment:          Prediabetes: 5.7 - 6.4          Diabetes: >6.4          Glycemic control for adults with diabetes: <7.0    Est. average glucose Bld  gHb Est-mCnc 134 mg/dL  TSH     Status: None   Collection Time: 12/29/22 12:04 PM  Result Value Ref Range   TSH 2.290 0.450 - 4.500 uIU/mL  CMP14+EGFR     Status: Abnormal   Collection Time: 12/29/22 12:04 PM  Result Value Ref Range   Glucose 102 (H) 70 - 99 mg/dL   BUN 8 8 - 27 mg/dL   Creatinine, Ser 1.61 0.57 - 1.00 mg/dL   eGFR 73 >09 UE/AVW/0.98   BUN/Creatinine Ratio 9 (L) 12 - 28   Sodium 143 134 - 144 mmol/L   Potassium 4.8 3.5 - 5.2 mmol/L   Chloride 105 96 - 106 mmol/L   CO2 22 20 - 29 mmol/L   Calcium 10.0 8.7 - 10.3 mg/dL   Total Protein 6.3 6.0 - 8.5 g/dL   Albumin 4.3 3.9 - 4.9 g/dL   Globulin, Total 2.0 1.5 - 4.5 g/dL   Albumin/Globulin Ratio 2.2 1.2 - 2.2   Bilirubin Total 0.4 0.0 - 1.2 mg/dL   Alkaline Phosphatase 75 44 - 121 IU/L   AST 47 (H) 0 - 40 IU/L   ALT 53 (H) 0 - 32 IU/L  Lipid panel     Status: None   Collection Time: 12/29/22 12:04 PM  Result Value Ref Range   Cholesterol, Total 110 100 - 199 mg/dL   Triglycerides 71 0 - 149 mg/dL   HDL 56 >11 mg/dL    VLDL Cholesterol Cal 15 5 - 40 mg/dL   LDL Chol Calc (NIH) 39 0 - 99 mg/dL   Chol/HDL Ratio 2.0 0.0 - 4.4 ratio    Comment:                                   T. Chol/HDL Ratio                                             Men  Women                               1/2 Avg.Risk  3.4    3.3                                   Avg.Risk  5.0    4.4                                2X Avg.Risk  9.6    7.1                                3X Avg.Risk 23.4   11.0       Assessment & Plan:   Problem List Items Addressed This Visit   None   No follow-ups on file.   Total time spent: 35 minutes  Orson Eva, NP  01/01/2023

## 2023-01-01 NOTE — Patient Instructions (Signed)
1) Cut back on carbs and sweets 2) All other labs are WNL, great job 3) Follow up appt in 4 months, fasting labs prior 4) Liver enzymes in 1 month, nonfasting

## 2023-01-08 ENCOUNTER — Ambulatory Visit: Payer: Medicare PPO

## 2023-01-08 DIAGNOSIS — I2510 Atherosclerotic heart disease of native coronary artery without angina pectoris: Secondary | ICD-10-CM

## 2023-01-08 DIAGNOSIS — E782 Mixed hyperlipidemia: Secondary | ICD-10-CM

## 2023-01-09 NOTE — Chronic Care Management (AMB) (Signed)
Follow Up Pharmacist Visit (CMCS) Clinical Summary Summary Next Pharmacist Follow Up: 04/30/23 Next AWV: to be scheduled Summary for PCP:  - Patient requests a Prescription for BP cuff to be sent to [facility]. - Recommended Mammogram and AWV - please add HTN and osteoporosis to problem list  . 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 Retored om 2010. Sister lives down and brother in [place] Has a dog thats 86yrs ago. Lifestyle habits: Diet: microwave vegs, steak in air fryer, Coffee is 1 cup in AM. Sometimes Starbucks. BF is iffy, lunch or whenever she gets hungry. Last meal is before 6lbs. 30oz of water and Pepsi, very little snack like chips and peanuts. No smoking or alcohol in 1993 and 1998. What is the patient's sleep pattern?: No sleep issues, Other (with free form text) Details: takes [place].  How many hours per night does patient typically sleep?: 8-9hrs SDOH: Accountable Health Communities Health-Related Social Needs Screening Tool ([employer]) SDOH questions were documented in [employer] within the past 12 months or since hospitalization?: No Are you completing SDOH today?: Yes What is your living situation today? (ref #1): I have a steady place to live Think about the place you live. Do you have  problems with any of the following? (ref #2): None of the above Within the past 12 months, you worried that your food would run out before you got money to buy more (ref #3): Never true Within the past 12 months, the food you bought just didn't last and you didn't have money to get more (ref #4): Never true In the past 12 months, has lack of reliable transportation kept you from medical appointments, meetings, work or from getting things needed for daily living? (ref #5): No In the past 12 months, has the electric, gas, oil, or water company threatened to shut off services in your home? (ref #6): No How often does anyone, including family and friends, physically hurt you? (ref #7): Never (1) How often does anyone, including family and friends, insult or talk down to you? (ref #8): Never (1) How often does anyone, including friends and family, threaten you with harm? (ref #9): Never (1) How often does anyone, including family and friends, scream or curse at you? (ref #10): Never (1) Medication Adherence Does the [facility] have access to medication refill history?: No Is Patient using [facility] pharmacy?: No [facility] Current Rx insurance plan: Other Details: Veteran's administration Are meds synced by current pharmacy?: No Are meds delivered by current pharmacy?: Yes - by mail order pharmacy Would patient benefit from direct intervention of clinical lead in dispensing process to optimize clinical outcomes?: No Additional Info: 7 day pillplanner Assessment:: Adherent Hypertension (HTN) Most Recent BP: 148/88 Most Recent HR: 72 taken on: 01/01/2023 Care Gap: Need BP documented or last BP 140/90 or higher: Addressed Assessed today?: Yes BP today is: 128/80 Goal: <130/80 mmHG Is Patient checking BP at home?: Yes Has patient experienced hypotension, dizziness, falls or bradycardia?: No We discussed: Proper Home  BP Measurement, Contacting PCP office for  signs and symptoms of high or low blood pressure (hypotension, dizziness, falls, headaches, edema), DASH diet:  following a diet emphasizing fruits and vegetables and low-fat dairy products along with whole grains, fish, poultry, and nuts. Reducing red meats and sugars. Assessment:: Uncontrolled Drug: Losartan 25mg - 1 tablet once  daily with meals  Pharmacist Assessment: Appropriate, Query Effectiveness Plan/Follow up: RX - home BP monitor Hyperlipidemia/Dyslipidemia (HLD) Last Lipid panel on: 12/29/2022 TC (Goal<200): 110 LDL: 39 HDL (Goal>40): 56 TG (Goal<150): 71 ASCVD 10-year risk?is:: N/A due to existing ASCVD Assessed today?: No Drug: Atorvastatin 40mg - 1 tablet at bedtime  Diabetes (DM) Most recent A1C: 6.3 taken on: 12/29/2022 Most Recent GFR: 73 taken on: 12/29/2022 Type: Pre-Diabetes/Impaired Fasting Glucose Assessed today?: No Depression Most recent PHQ-9 Score: N/A Assessed today?: No Drug: ClonazePAM 0.5mg - 0.25-0.5mg  by mouth daily as needed.  Drug: Duloxetine 60mg - 1 capsule twice daily.  GERD Assessment Assessed today?: No Drug: Omeprazole 40mg - 1 capsule daily.  Cardiovascular Disease (CVD) Care Gap: Moderate / high intensity statin therapy needed: Addressed Assessed today?: Yes Is Patient taking statin medication?: Yes Is patient currently Smoking or Vaping?: No Did patient smoke/vape before?: No Is patient currently on antiplatelet therapy?: Yes Is patient experiencing any abnormal bruising or bleeding?: No Assessment:: Controlled Drug: Eliquis 2.5mg - 1 tablet two times daily.  Pharmacist Assessment: Appropriate, Effective, Safe, Accessible Drug: Digoxin 0.125mg - 1 tablet daily.  Pharmacist Assessment: Appropriate, Effective, Safe, Accessible Osteopenia or Osteoporosis Most recent T-score: N/A Most recent Vitamin D 25-OH: N/A Assessed today?: No Insomnia Assessed today?: No Drug: Ambien 10mg - 1 tablet nightly as needed. Chronic Pain Assessed  today?: No Drug: Gabapentin 800mg - 1 tablet 3 times daily.  General Disease Assessment Assessed today?: No Preventative Health Care Gap: Colorectal cancer screening: Addressed Care Gap: Breast cancer screening: Needs to be addressed Care Gap: Annual Wellness Visit (AWV): Needs to be addressed Plan/Follow up: Mammogram Engagement Notes Charlestine Massed on 01/04/2023 09:44 AM HC Billed time: CCM: 0:42:00  Engagement Notes Lynann Bologna on 01/09/2023 04:18 PM Office Visit Documentation 15 min Office Visit 72 mins Chart Prep 42 min

## 2023-01-30 ENCOUNTER — Other Ambulatory Visit: Payer: Medicare PPO

## 2023-01-30 DIAGNOSIS — R748 Abnormal levels of other serum enzymes: Secondary | ICD-10-CM

## 2023-01-31 LAB — CMP14+EGFR
ALT: 64 IU/L — ABNORMAL HIGH (ref 0–32)
AST: 56 IU/L — ABNORMAL HIGH (ref 0–40)
Albumin/Globulin Ratio: 2.2
Albumin: 4.4 g/dL (ref 3.9–4.9)
Alkaline Phosphatase: 74 IU/L (ref 44–121)
BUN/Creatinine Ratio: 14 (ref 12–28)
BUN: 11 mg/dL (ref 8–27)
Bilirubin Total: 0.5 mg/dL (ref 0.0–1.2)
CO2: 20 mmol/L (ref 20–29)
Calcium: 9.3 mg/dL (ref 8.7–10.3)
Chloride: 107 mmol/L — ABNORMAL HIGH (ref 96–106)
Creatinine, Ser: 0.81 mg/dL (ref 0.57–1.00)
Globulin, Total: 2 g/dL (ref 1.5–4.5)
Glucose: 83 mg/dL (ref 70–99)
Potassium: 4.8 mmol/L (ref 3.5–5.2)
Sodium: 141 mmol/L (ref 134–144)
Total Protein: 6.4 g/dL (ref 6.0–8.5)
eGFR: 78 mL/min/{1.73_m2} (ref >59)

## 2023-02-12 ENCOUNTER — Other Ambulatory Visit: Payer: Self-pay | Admitting: Nurse Practitioner

## 2023-02-12 DIAGNOSIS — Z1231 Encounter for screening mammogram for malignant neoplasm of breast: Secondary | ICD-10-CM

## 2023-02-20 ENCOUNTER — Ambulatory Visit: Payer: Medicare PPO | Admitting: Nurse Practitioner

## 2023-02-21 ENCOUNTER — Ambulatory Visit
Admission: RE | Admit: 2023-02-21 | Discharge: 2023-02-21 | Disposition: A | Discharge status: Home / Self Care | Payer: Medicare PPO | Source: Ambulatory Visit | Attending: Nurse Practitioner | Admitting: Nurse Practitioner

## 2023-02-21 DIAGNOSIS — Z7960 Long term (current) use of unspecified immunomodulators and immunosuppressants: Secondary | ICD-10-CM | POA: Diagnosis not present

## 2023-02-21 DIAGNOSIS — M316 Other giant cell arteritis: Secondary | ICD-10-CM | POA: Diagnosis not present

## 2023-02-21 DIAGNOSIS — Z1231 Encounter for screening mammogram for malignant neoplasm of breast: Secondary | ICD-10-CM | POA: Insufficient documentation

## 2023-02-21 DIAGNOSIS — H26491 Other secondary cataract, right eye: Secondary | ICD-10-CM | POA: Diagnosis not present

## 2023-02-26 ENCOUNTER — Encounter: Payer: Self-pay | Admitting: Cardiovascular Disease

## 2023-02-26 ENCOUNTER — Ambulatory Visit: Payer: Medicare PPO | Admitting: Cardiovascular Disease

## 2023-02-26 VITALS — BP 142/78 | HR 73 | Ht 63.0 in | Wt 138.8 lb

## 2023-02-26 DIAGNOSIS — I7090 Unspecified atherosclerosis: Secondary | ICD-10-CM

## 2023-02-26 DIAGNOSIS — I700 Atherosclerosis of aorta: Secondary | ICD-10-CM | POA: Diagnosis not present

## 2023-02-26 DIAGNOSIS — I25118 Atherosclerotic heart disease of native coronary artery with other forms of angina pectoris: Secondary | ICD-10-CM

## 2023-02-26 DIAGNOSIS — I739 Peripheral vascular disease, unspecified: Secondary | ICD-10-CM

## 2023-02-26 DIAGNOSIS — I70212 Atherosclerosis of native arteries of extremities with intermittent claudication, left leg: Secondary | ICD-10-CM | POA: Diagnosis not present

## 2023-02-26 DIAGNOSIS — I340 Nonrheumatic mitral (valve) insufficiency: Secondary | ICD-10-CM

## 2023-02-26 DIAGNOSIS — R0789 Other chest pain: Secondary | ICD-10-CM | POA: Diagnosis not present

## 2023-02-26 NOTE — Progress Notes (Signed)
Cardiology Office Note   Date:  02/26/2023   ID:  Sara [name], [place] 06-19-1951, MRN [id number]  PCP:  Sara Eva, NP  Cardiologist:  Adrian Blackwater, MD      History of Present Illness: Sara [name] is a 72 y.o. female who presents for  Chief Complaint  Patient presents with   Follow-up    Pain in upper L arm    Chest Pain  This is a new problem. The current episode started in the past 7 days. The problem has been gradually worsening. The pain is present in the substernal region. The pain is at a severity of 6/10. The pain is moderate. The quality of the pain is described as pressure. The pain radiates to the left arm. Associated symptoms include numbness.      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 Gilda [name]   Vertigo    Last episode approx Aug 2015   Wears dentures    full upper     Past Surgical History:  Procedure Laterality Date   ADENOIDECTOMY     AMPUTATION Right 08/11/2020   Procedure: AMPUTATION ABOVE KNEE;  Surgeon: Sara Dills, MD;  Location: [facility];  Service: Vascular;  Laterality: Right;   ARTERY BIOPSY Right 07/14/2019   Procedure: BIOPSY TEMPORAL ARTERY;  Surgeon: Sara Dills, MD;  Location: [facility];  Service: Vascular;  Laterality: Right;   BREAST CYST ASPIRATION Left    CARDIAC CATHETERIZATION  02/15/2017   [facility]   COLONOSCOPY WITH PROPOFOL N/A 10/22/2017   Procedure: COLONOSCOPY WITH PROPOFOL;  Surgeon: Sara Minium, MD;  Location: [facility];  Service: Endoscopy;  Laterality: N/A;  specimens not taken--pt on Plavix will be  brought back in after 7 days off med   COLONOSCOPY WITH PROPOFOL N/A 11/05/2017   Procedure: COLONOSCOPY WITH PROPOFOL;  Surgeon: Sara Minium, MD;  Location: [facility];  Service: Endoscopy;  Laterality: N/A;   COLONOSCOPY WITH PROPOFOL N/A 11/13/2022   Procedure: COLONOSCOPY WITH PROPOFOL;  Surgeon: Sara Minium, MD;  Location: [facility];  Service: Endoscopy;  Laterality: N/A;   ESOPHAGOGASTRODUODENOSCOPY N/A 12/21/2014   Procedure: ESOPHAGOGASTRODUODENOSCOPY (EGD);  Surgeon: Sara Minium, MD;  Location: [facility];  Service: Gastroenterology;  Laterality: N/A;   ESOPHAGOGASTRODUODENOSCOPY (EGD) WITH PROPOFOL N/A 02/08/2016   Procedure: ESOPHAGOGASTRODUODENOSCOPY (EGD) WITH PROPOFOL;  Surgeon: Sara Minium, MD;  Location: [facility];  Service: Endoscopy;  Laterality: N/A;   FASCIOTOMY Right 05/30/2019   Procedure: FASCIOTOMY;  Surgeon: Sara Dills, MD;  Location: [facility];  Service: Vascular;  Laterality: Right;   FASCIOTOMY CLOSURE Right 06/04/2019   Procedure: FASCIOTOMY CLOSURE;  Surgeon: Sara Dills, MD;  Location: [facility];  Service: Vascular;  Laterality: Right;   HEMORROIDECTOMY  2014   LOWER EXTREMITY ANGIOGRAPHY Right 05/30/2019   Procedure: LOWER EXTREMITY ANGIOGRAPHY;  Surgeon: Sara Dills, MD;  Location: [facility];  Service: Cardiovascular;  Laterality: Right;   LOWER EXTREMITY ANGIOGRAPHY Right 07/02/2020  Procedure: LOWER EXTREMITY ANGIOGRAPHY;  Surgeon: Sara Dills, MD;  Location: [facility];  Service: Cardiovascular;  Laterality: Right;   PERIPHERAL VASCULAR CATHETERIZATION N/A 02/09/2016   Procedure: Abdominal Aortogram w/Lower Extremity;  Surgeon: Sara Dills, MD;  Location: [facility];  Service: Cardiovascular;  Laterality: N/A;   PERIPHERAL VASCULAR CATHETERIZATION Right 02/10/2016   Procedure: Lower Extremity Angiography;  Surgeon: Sara Dills, MD;  Location: [facility];   Service: Cardiovascular;  Laterality: Right;   POLYPECTOMY  11/05/2017   Procedure: POLYPECTOMY;  Surgeon: Sara Minium, MD;  Location: [facility];  Service: Endoscopy;;   POLYPECTOMY  11/13/2022   Procedure: POLYPECTOMY;  Surgeon: Sara Minium, MD;  Location: [facility];  Service: Endoscopy;;   SHOULDER ARTHROSCOPY WITH ROTATOR CUFF REPAIR AND SUBACROMIAL DECOMPRESSION Right 02/27/2020   Procedure: RIGHT SHOULDER ARTHROSCOPY SUBACROMIAL DECOMPRESSION, DISTAL CLAVICLE EXCISION AND MINI-OPEN ROTATOR CUFF REPAIR;  Surgeon: Sara Fairly, MD;  Location: [facility];  Service: Orthopedics;  Laterality: Right;   TONSILLECTOMY     VASCULAR SURGERY  [id number]   Fem-Pop Bypass     Current Outpatient Medications  Medication Sig Dispense Refill   apixaban (ELIQUIS) 2.5 MG TABS tablet Take 2.5 mg by mouth 2 (two) times daily.     atorvastatin (LIPITOR) 40 MG tablet Take 40 mg by mouth at bedtime.      Calcium Carb-Cholecalciferol 600-10 MG-MCG TABS Take 1 tablet by mouth 2 (two) times daily.     clonazePAM (KLONOPIN) 0.5 MG tablet Take 0.25-0.5 mg by mouth daily as needed. (Patient not taking: Reported on 01/08/2023)     digoxin (LANOXIN) 0.125 MG tablet Take 125 mcg by mouth daily.     DULoxetine (CYMBALTA) 60 MG capsule TAKE 1 CAPSULE BY MOUTH TWICE A DAY 180 capsule 3   gabapentin (NEURONTIN) 800 MG tablet Take 1 tablet (800 mg total) by mouth 3 (three) times daily. 90 tablet 0   ivabradine (CORLANOR) 7.5 MG TABS tablet Take 1 tablet (7.5 mg total) by mouth 2 (two) times daily with a meal. 60 tablet 3   losartan (COZAAR) 25 MG tablet Take 25 mg by mouth every morning.      omeprazole (PRILOSEC) 40 MG capsule Take 40 mg by mouth daily as needed.     Tocilizumab (ACTEMRA ACTPEN) 162 MG/0.9ML SOAJ Inject into the skin.     triamcinolone (KENALOG) 0.025 % cream Apply 1 Application topically at bedtime.     zolpidem (AMBIEN) 10 MG tablet TAKE 1 TABLET BY MOUTH NIGHTLY AT BEDTIME AS NEEDED  FOR INSOMNIA 30 tablet 1   No current facility-administered medications for this visit.    Allergies:   Hydrocodone-acetaminophen, Amoxicillin, Diltiazem, Metoprolol tartrate, Oxycodone, and Vicodin [hydrocodone-acetaminophen]    Social History:   reports that she quit smoking about 31 years ago. Her smoking use included cigarettes. She has a 50.00 pack-year smoking history. She has never used smokeless tobacco. She reports that she does not drink alcohol and does not use drugs.   Family History:  family history includes CVA in her mother; Cancer in her father; Colon cancer in her father; Heart attack in her mother; Heart disease in her maternal uncle.    ROS:     Review of Systems  Constitutional: Negative.   HENT: Negative.    Eyes: Negative.   Respiratory: Negative.    Cardiovascular:  Positive for chest pain.  Gastrointestinal: Negative.   Genitourinary: Negative.   Musculoskeletal: Negative.   Skin: Negative.  Neurological:  Positive for numbness.  Endo/Heme/Allergies: Negative.   Psychiatric/Behavioral: Negative.    All other systems reviewed and are negative.     All other systems are reviewed and negative.    PHYSICAL EXAM: VS:  BP (!) 142/78   Pulse 73   Ht 5\' 3"  (1.6 m)   Wt 138 lb 12.8 oz (63 kg)   SpO2 97%   BMI 24.59 kg/m  , BMI Body mass index is 24.59 kg/m. Last weight:  Wt Readings from Last 3 Encounters:  02/26/23 138 lb 12.8 oz (63 kg)  01/01/23 130 lb (59 kg)  11/24/22 135 lb 9.6 oz (61.5 kg)     Physical Exam Constitutional:      Appearance: Normal appearance.  Cardiovascular:     Rate and Rhythm: Normal rate and regular rhythm.     Heart sounds: Normal heart sounds.  Pulmonary:     Effort: Pulmonary effort is normal.     Breath sounds: Normal breath sounds.  Musculoskeletal:     Right lower leg: No edema.     Left lower leg: No edema.  Neurological:     Mental Status: She is alert.       EKG:   Recent Labs: 10/09/2022:  Hemoglobin 14.8; Platelets 197 12/29/2022: TSH 2.290 01/30/2023: ALT 64; BUN 11; Creatinine, Ser 0.81; Potassium 4.8; Sodium 141    Lipid Panel    Component Value Date/Time   CHOL 110 12/29/2022 1204   TRIG 71 12/29/2022 1204   HDL 56 12/29/2022 1204   CHOLHDL 2.0 12/29/2022 1204   LDLCALC 39 12/29/2022 1204      Other studies Reviewed: Additional studies/ records that were reviewed today include:  Review of the above records demonstrates:       No data to display            ASSESSMENT AND PLAN:    ICD-10-CM   1. Other chest pain  R07.89 MYOCARDIAL PERFUSION IMAGING    PCV ECHOCARDIOGRAM COMPLETE   Anginal type chest pain.,advise echo, stress test    2. Angiopathy, 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HCC)  I73.9 MYOCARDIAL PERFUSION IMAGING    PCV ECHOCARDIOGRAM COMPLETE    3. Arterial occlusion (HCC)  I70.90 MYOCARDIAL PERFUSION IMAGING    PCV ECHOCARDIOGRAM COMPLETE    4. Atherosclerosis of aorta (HCC)  I70.0 MYOCARDIAL PERFUSION IMAGING    PCV ECHOCARDIOGRAM COMPLETE    5. 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 Non-rheumatic mitral regurgitation  I34.0 MYOCARDIAL PERFUSION IMAGING    PCV ECHOCARDIOGRAM COMPLETE    7. Coronary artery disease of native artery of native heart with stable angina pectoris (HCC)  I25.118 MYOCARDIAL PERFUSION IMAGING    PCV ECHOCARDIOGRAM COMPLETE       Problem List Items Addressed This Visit       Cardiovascular and Mediastinum   Angiopathy, 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HCC)   Relevant Orders   MYOCARDIAL PERFUSION IMAGING   PCV ECHOCARDIOGRAM COMPLETE   Arterial occlusion (HCC)   Relevant Orders   MYOCARDIAL PERFUSION IMAGING   PCV ECHOCARDIOGRAM COMPLETE   Atherosclerosis of aorta (HCC)   Relevant Orders   MYOCARDIAL PERFUSION IMAGING   PCV ECHOCARDIOGRAM COMPLETE   CAD (coronary artery disease)   Relevant Orders   MYOCARDIAL  PERFUSION IMAGING   PCV ECHOCARDIOGRAM COMPLETE   Non-rheumatic mitral regurgitation   Relevant Orders   MYOCARDIAL PERFUSION IMAGING   PCV ECHOCARDIOGRAM COMPLETE  Atherosclerosis of native arteries of extremity with intermittent claudication (HCC)   Relevant Orders   MYOCARDIAL PERFUSION IMAGING   PCV ECHOCARDIOGRAM COMPLETE     Other   Pain in the chest - Primary   Relevant Orders   MYOCARDIAL PERFUSION IMAGING   PCV ECHOCARDIOGRAM COMPLETE       Disposition:   Return in about 1 week (around 03/05/2023) for echo stress test and f/u.    Total time spent: 30 minutes  Signed,  Adrian Blackwater, MD  02/26/2023 10:39 AM    [facility]

## 2023-03-03 DIAGNOSIS — M438X4 Other specified deforming dorsopathies, thoracic region: Secondary | ICD-10-CM | POA: Diagnosis not present

## 2023-03-03 DIAGNOSIS — M438X6 Other specified deforming dorsopathies, lumbar region: Secondary | ICD-10-CM | POA: Diagnosis not present

## 2023-03-03 DIAGNOSIS — M898X8 Other specified disorders of bone, other site: Secondary | ICD-10-CM | POA: Diagnosis not present

## 2023-03-03 DIAGNOSIS — C7951 Secondary malignant neoplasm of bone: Secondary | ICD-10-CM | POA: Diagnosis not present

## 2023-03-05 ENCOUNTER — Ambulatory Visit (INDEPENDENT_AMBULATORY_CARE_PROVIDER_SITE_OTHER): Payer: Medicare Other | Admitting: Vascular Surgery

## 2023-03-05 ENCOUNTER — Encounter (INDEPENDENT_AMBULATORY_CARE_PROVIDER_SITE_OTHER): Payer: Medicare Other

## 2023-03-05 DIAGNOSIS — H26492 Other secondary cataract, left eye: Secondary | ICD-10-CM | POA: Diagnosis not present

## 2023-03-07 DIAGNOSIS — M899 Disorder of bone, unspecified: Secondary | ICD-10-CM | POA: Diagnosis not present

## 2023-03-09 ENCOUNTER — Other Ambulatory Visit: Payer: Medicare PPO

## 2023-03-12 DIAGNOSIS — H26492 Other secondary cataract, left eye: Secondary | ICD-10-CM | POA: Diagnosis not present

## 2023-03-13 ENCOUNTER — Ambulatory Visit: Payer: Medicare PPO | Admitting: Cardiovascular Disease

## 2023-03-19 ENCOUNTER — Ambulatory Visit (INDEPENDENT_AMBULATORY_CARE_PROVIDER_SITE_OTHER): Payer: Medicare Other | Admitting: Vascular Surgery

## 2023-03-19 ENCOUNTER — Ambulatory Visit (INDEPENDENT_AMBULATORY_CARE_PROVIDER_SITE_OTHER): Payer: Medicare PPO

## 2023-03-19 ENCOUNTER — Encounter (INDEPENDENT_AMBULATORY_CARE_PROVIDER_SITE_OTHER): Payer: Medicare Other

## 2023-03-19 DIAGNOSIS — I25118 Atherosclerotic heart disease of native coronary artery with other forms of angina pectoris: Secondary | ICD-10-CM

## 2023-03-19 DIAGNOSIS — R0789 Other chest pain: Secondary | ICD-10-CM

## 2023-03-19 DIAGNOSIS — I739 Peripheral vascular disease, unspecified: Secondary | ICD-10-CM

## 2023-03-19 DIAGNOSIS — I7090 Unspecified atherosclerosis: Secondary | ICD-10-CM

## 2023-03-19 DIAGNOSIS — I340 Nonrheumatic mitral (valve) insufficiency: Secondary | ICD-10-CM | POA: Diagnosis not present

## 2023-03-19 DIAGNOSIS — I70212 Atherosclerosis of native arteries of extremities with intermittent claudication, left leg: Secondary | ICD-10-CM | POA: Diagnosis not present

## 2023-03-19 DIAGNOSIS — I700 Atherosclerosis of aorta: Secondary | ICD-10-CM

## 2023-03-19 MED ORDER — TECHNETIUM TC 99M SESTAMIBI GENERIC - CARDIOLITE
32.4000 | Freq: Once | INTRAVENOUS | Status: AC | PRN
Start: 2023-03-19 — End: 2023-03-19
  Administered 2023-03-19: 32.4 via INTRAVENOUS

## 2023-03-19 MED ORDER — TECHNETIUM TC 99M SESTAMIBI GENERIC - CARDIOLITE
9.8000 | Freq: Once | INTRAVENOUS | Status: AC | PRN
Start: 2023-03-19 — End: 2023-03-19
  Administered 2023-03-19: 9.8 via INTRAVENOUS

## 2023-03-26 ENCOUNTER — Ambulatory Visit (INDEPENDENT_AMBULATORY_CARE_PROVIDER_SITE_OTHER): Payer: Medicare PPO

## 2023-03-26 ENCOUNTER — Ambulatory Visit: Payer: Medicare PPO | Admitting: Internal Medicine

## 2023-03-26 ENCOUNTER — Ambulatory Visit: Payer: Medicare PPO | Admitting: Cardiovascular Disease

## 2023-03-26 DIAGNOSIS — I7090 Unspecified atherosclerosis: Secondary | ICD-10-CM

## 2023-03-26 DIAGNOSIS — I700 Atherosclerosis of aorta: Secondary | ICD-10-CM

## 2023-03-26 DIAGNOSIS — I739 Peripheral vascular disease, unspecified: Secondary | ICD-10-CM

## 2023-03-26 DIAGNOSIS — I361 Nonrheumatic tricuspid (valve) insufficiency: Secondary | ICD-10-CM

## 2023-03-26 DIAGNOSIS — I340 Nonrheumatic mitral (valve) insufficiency: Secondary | ICD-10-CM | POA: Diagnosis not present

## 2023-03-26 DIAGNOSIS — I70212 Atherosclerosis of native arteries of extremities with intermittent claudication, left leg: Secondary | ICD-10-CM

## 2023-03-26 DIAGNOSIS — I25118 Atherosclerotic heart disease of native coronary artery with other forms of angina pectoris: Secondary | ICD-10-CM

## 2023-03-26 DIAGNOSIS — R0789 Other chest pain: Secondary | ICD-10-CM

## 2023-03-29 ENCOUNTER — Encounter: Payer: Self-pay | Admitting: Cardiology

## 2023-03-29 ENCOUNTER — Ambulatory Visit: Payer: Medicare PPO | Admitting: Cardiovascular Disease

## 2023-03-29 ENCOUNTER — Ambulatory Visit (INDEPENDENT_AMBULATORY_CARE_PROVIDER_SITE_OTHER): Payer: Medicare PPO | Admitting: Cardiology

## 2023-03-29 VITALS — BP 132/70 | HR 76 | Ht 63.0 in | Wt 134.0 lb

## 2023-03-29 DIAGNOSIS — R21 Rash and other nonspecific skin eruption: Secondary | ICD-10-CM

## 2023-03-29 HISTORY — DX: Rash and other nonspecific skin eruption: R21

## 2023-03-29 MED ORDER — TRIAMCINOLONE ACETONIDE 0.5 % EX OINT: 1.0000 | TOPICAL_OINTMENT | Freq: Two times a day (BID) | CUTANEOUS | 3 refills | Status: AC

## 2023-03-29 NOTE — Progress Notes (Signed)
Established Patient Office Visit  Subjective:  Patient ID: Sara [name], female    DOB: 06-22-1951  Age: 72 y.o. MRN: [id number]  Chief Complaint  Patient presents with   Acute Visit    Bumps on chest    Patient in office complaining of bumps on chest. Bumps started over 6 months ago. They do not itch. Bumps appear as erythematous papules with raised area of skin with no visible fluid less than 1 cm in diameter with distinct borders. Papules on chest, neck and face. Patient states they scab over causing her to pick at them. Recommend using antibiotic ointment on the areas she has picked at. Patient has been using Triamcinolone Acetonide 0.025% gram with some improvement. Continue using Triamcinolone ointment, will refer to dermatology.     No other concerns at this time.   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 Gilda Crease   Vertigo    Last episode approx Aug 2015   Wears dentures    full upper    Past Surgical History:  Procedure Laterality Date   ADENOIDECTOMY     AMPUTATION Right 08/11/2020   Procedure: AMPUTATION ABOVE KNEE;  Surgeon: Renford Dills, MD;  Location: [facility] OR[facility];  Service: Vascular;  Laterality: Right;   ARTERY BIOPSY Right 07/14/2019   Procedure: BIOPSY TEMPORAL ARTERY;  Surgeon: Renford Dills, MD;  Location: [facility];  Service: Vascular;  Laterality: Right;   BREAST CYST ASPIRATION Left    CARDIAC CATHETERIZATION  02/15/2017   [facility]   COLONOSCOPY WITH PROPOFOL N/A 10/22/2017   Procedure: COLONOSCOPY WITH PROPOFOL;  Surgeon: Midge Minium, MD;  Location: [facility]ERY [facility];  Service: Endoscopy;  Laterality: N/A;  specimens not taken--pt on Plavix will be brought back in after 7 days off med   COLONOSCOPY WITH PROPOFOL N/A 11/05/2017   Procedure: COLONOSCOPY WITH PROPOFOL;  Surgeon: Midge Minium, MD;  Location: [facility];  Service: Endoscopy;  Laterality: N/A;   COLONOSCOPY WITH PROPOFOL N/A 11/13/2022   Procedure: COLONOSCOPY WITH PROPOFOL;  Surgeon: Midge Minium, MD;  Location: [facility] SURGERY CNTR;  Service: Endoscopy;  Laterality: N/A;   ESOPHAGOGASTRODUODENOSCOPY N/A 12/21/2014   Procedure: ESOPHAGOGASTRODUODENOSCOPY (EGD);  Surgeon: Midge Minium, MD;  Location: [facility] SURGERY [facility];  Service: Gastroenterology;  Laterality: N/A;   ESOPHAGOGASTRODUODENOSCOPY (EGD) WITH PROPOFOL N/A 02/08/2016   Procedure: ESOPHAGOGASTRODUODENOSCOPY (EGD) WITH PROPOFOL;  Surgeon: Midge Minium, MD;  Location: [facility] ENDOS[facility];  Service: Endoscopy;  Laterality: N/A;   FASCIOTOMY Right 05/30/2019   Procedure: FASCIOTOMY;  Surgeon: Renford Dills, MD;  Location: [facility];  Service: Vascular;  Laterality: Right;   FASCIOTOMY CLOSURE Right 06/04/2019   Procedure: FASCIOTOMY CLOSURE;  Surgeon: Renford Dills, MD;  Location: [facility];  Service: Vascular;  Laterality: Right;   HEMORROIDECTOMY  2014   LOWER EXTREMITY ANGIOGRAPHY Right 05/30/2019   Procedure: LOWER EXTREMITY ANGIOGRAPHY;  Surgeon: Renford Dills, MD;  Location: [facility];  Service: Cardiovascular;  Laterality: Right;   LOWER EXTREMITY ANGIOGRAPHY Right 07/02/2020  Procedure: LOWER EXTREMITY ANGIOGRAPHY;  Surgeon: Renford Dills, MD;  Location: [facility];  Service: Cardiovascular;  Laterality: Right;   PERIPHERAL VASCULAR CATHETERIZATION N/A 02/09/2016   Procedure: Abdominal Aortogram w/Lower Extremity;  Surgeon: Renford Dills, MD;  Location: [facility];  Service: Cardiovascular;  Laterality: N/A;   PERIPHERAL VASCULAR CATHETERIZATION Right 02/10/2016   Procedure:  Lower Extremity Angiography;  Surgeon: Renford Dills, MD;  Location: [facility];  Service: Cardiovascular;  Laterality: Right;   POLYPECTOMY  11/05/2017   Procedure: POLYPECTOMY;  Surgeon: Midge Minium, MD;  Location: [facility] SUR[facility];  Service: Endoscopy;;   POLYPECTOMY  11/13/2022   Procedure: POLYPECTOMY;  Surgeon: Midge Minium, MD;  Location: [facility] SURGERY [facility];  Service: Endoscopy;;   SHOULDER ARTHROSCOPY WITH ROTATOR CUFF REPAIR AND SUBACROMIAL DECOMPRESSION Right 02/27/2020   Procedure: RIGHT SHOULDER ARTHROSCOPY SUBACROMIAL DECOMPRESSION, DISTAL CLAVICLE EXCISION AND MINI-OPEN ROTATOR CUFF REPAIR;  Surgeon: Juanell Fairly, MD;  Location: [facility];  Service: Orthopedics;  Laterality: Right;   TONSILLECTOMY     VASCULAR SURGERY  [id number]   Fem-Pop Bypass    Social History   Socioeconomic History   Marital status: Single    Spouse name: Not on file   Number of children: Not on file   Years of education: Not on file   Highest education level: Not on file  Occupational History   Not on file  Tobacco Use   Smoking status: Former    Current packs/day: 0.00    Average packs/day: 2.0 packs/day for 25.0 years (50.0 ttl pk-yrs)    Types: Cigarettes    Start date: 08/21/1966    Quit date: 08/22/1991    Years since quitting: 31.6   Smokeless tobacco: Never  Vaping Use   Vaping status: Never Used  Substance and Sexual Activity   Alcohol use: No    Alcohol/week: 0.0 standard drinks of alcohol   Drug use: Never   Sexual activity: Not Currently  Other Topics Concern   Not on file  Social History Narrative   Not on file   Social Determinants of Health   Financial Resource Strain: Not on file  Food Insecurity: Not on file  Transportation Needs: Not on file  Physical Activity: Not on file  Stress: Not on file  Social Connections: Not on file  Intimate Partner Violence: Not on file    Family History  Problem Relation Age of Onset   CVA Mother    Heart attack  Mother    Cancer Father        colon cancer   Colon cancer Father    Heart disease Maternal Uncle    Breast cancer Neg Hx     Allergies  Allergen Reactions   Hydrocodone-Acetaminophen     Other reaction(s): Flushing   Amoxicillin Other (See Comments)    Yeast infection   Diltiazem Other (See Comments)    headache   Metoprolol Tartrate Rash   Oxycodone Itching   Vicodin [Hydrocodone-Acetaminophen] Hives and Rash    Flushing    Review of Systems  Constitutional: Negative.   HENT: Negative.    Eyes: Negative.   Respiratory: Negative.  Negative for shortness of breath.   Cardiovascular: Negative.  Negative for chest pain.  Gastrointestinal: Negative.  Negative for abdominal pain, constipation and diarrhea.  Genitourinary: Negative.   Musculoskeletal:  Negative for joint pain and myalgias.  Skin: Negative.        Red bumps  Neurological: Negative.  Negative for dizziness  and headaches.  Endo/Heme/Allergies: Negative.   All other systems reviewed and are negative.      Objective:   BP 132/70   Pulse 76   Ht 5\' 3"  (1.6 m)   Wt 134 lb (60.8 kg)   SpO2 94%   BMI 23.74 kg/m   Vitals:   03/29/23 0936  BP: 132/70  Pulse: 76  Height: 5\' 3"  (1.6 m)  Weight: 134 lb (60.8 kg)  SpO2: 94%  BMI (Calculated): 23.74    Physical Exam Vitals and nursing note reviewed.  Constitutional:      Appearance: Normal appearance. She is normal weight.  HENT:     Head: Normocephalic and atraumatic.     Nose: Nose normal.     Mouth/Throat:     Mouth: Mucous membranes are moist.  Eyes:     Extraocular Movements: Extraocular movements intact.     Conjunctiva/sclera: Conjunctivae normal.     Pupils: Pupils are equal, round, and reactive to light.  Cardiovascular:     Rate and Rhythm: Normal rate and regular rhythm.     Pulses: Normal pulses.     Heart sounds: Normal heart sounds.  Pulmonary:     Effort: Pulmonary effort is normal.     Breath sounds: Normal breath sounds.   Abdominal:     General: Abdomen is flat. Bowel sounds are normal.     Palpations: Abdomen is soft.  Musculoskeletal:        General: Normal range of motion.     Cervical back: Normal range of motion.  Skin:    General: Skin is warm and dry.     Findings: Rash present. Rash is papular.       Neurological:     General: No focal deficit present.     Mental Status: She is alert and oriented to person, place, and time.  Psychiatric:        Mood and Affect: Mood normal.        Behavior: Behavior normal.        Thought Content: Thought content normal.        Judgment: Judgment normal.      No results found for any visits on 03/29/23.  Recent Results (from the past 2160 hour(s))  Hemoglobin A1c     Status: Abnormal   Collection Time: 12/29/22 12:04 PM  Result Value Ref Range   Hgb A1c MFr Bld 6.3 (H) 4.8 - 5.6 %    Comment:          Prediabetes: 5.7 - 6.4          Diabetes: >6.4          Glycemic control for adults with diabetes: <7.0    Est. average glucose Bld gHb Est-mCnc 134 mg/dL  TSH     Status: None   Collection Time: 12/29/22 12:04 PM  Result Value Ref Range   TSH 2.290 0.450 - 4.500 uIU/mL  CMP14+EGFR     Status: Abnormal   Collection Time: 12/29/22 12:04 PM  Result Value Ref Range   Glucose 102 (H) 70 - 99 mg/dL   BUN 8 8 - 27 mg/dL   Creatinine, Ser 9.81 0.57 - 1.00 mg/dL   eGFR 73 >19 JY/NWG/9.56   BUN/Creatinine Ratio 9 (L) 12 - 28   Sodium 143 134 - 144 mmol/L   Potassium 4.8 3.5 - 5.2 mmol/L   Chloride 105 96 - 106 mmol/L   CO2 22 20 - 29 mmol/L   Calcium 10.0 8.7 -  10.3 mg/dL   Total Protein 6.3 6.0 - 8.5 g/dL   Albumin 4.3 3.9 - 4.9 g/dL   Globulin, Total 2.0 1.5 - 4.5 g/dL   Albumin/Globulin Ratio 2.2 1.2 - 2.2   Bilirubin Total 0.4 0.0 - 1.2 mg/dL   Alkaline Phosphatase 75 44 - 121 IU/L   AST 47 (H) 0 - 40 IU/L   ALT 53 (H) 0 - 32 IU/L  Lipid panel     Status: None   Collection Time: 12/29/22 12:04 PM  Result Value Ref Range   Cholesterol,  Total 110 100 - 199 mg/dL   Triglycerides 71 0 - 149 mg/dL   HDL 56 >40 mg/dL   VLDL Cholesterol Cal 15 5 - 40 mg/dL   LDL Chol Calc (NIH) 39 0 - 99 mg/dL   Chol/HDL Ratio 2.0 0.0 - 4.4 ratio    Comment:                                   T. Chol/HDL Ratio                                             Men  Women                               1/2 Avg.Risk  3.4    3.3                                   Avg.Risk  5.0    4.4                                2X Avg.Risk  9.6    7.1                                3X Avg.Risk 23.4   11.0   CMP14+EGFR     Status: Abnormal   Collection Time: 01/30/23  1:06 PM  Result Value Ref Range   Glucose 83 70 - 99 mg/dL   BUN 11 8 - 27 mg/dL   Creatinine, Ser 9.81 0.57 - 1.00 mg/dL   eGFR 78 >19 JY/NWG/9.56   BUN/Creatinine Ratio 14 12 - 28   Sodium 141 134 - 144 mmol/L   Potassium 4.8 3.5 - 5.2 mmol/L   Chloride 107 (H) 96 - 106 mmol/L   CO2 20 20 - 29 mmol/L   Calcium 9.3 8.7 - 10.3 mg/dL   Total Protein 6.4 6.0 - 8.5 g/dL   Albumin 4.4 3.9 - 4.9 g/dL   Globulin, Total 2.0 1.5 - 4.5 g/dL   Albumin/Globulin Ratio 2.2    Bilirubin Total 0.5 0.0 - 1.2 mg/dL   Alkaline Phosphatase 74 44 - 121 IU/L   AST 56 (H) 0 - 40 IU/L   ALT 64 (H) 0 - 32 IU/L      Assessment & Plan:  Continue using Triamcinolone Acetonide.  Dermatology referral sent.   Problem List Items Addressed This Visit       Musculoskeletal and Integument   Papular rash - Primary  Relevant Orders   Ambulatory referral to Dermatology    No follow-ups on file.   Total time spent: 30 minutes  [employer], NP  03/29/2023   This document may have been prepared by Dragon Voice Recognition software and as such may include unintentional dictation errors.

## 2023-03-30 ENCOUNTER — Encounter: Payer: Self-pay | Admitting: Cardiovascular Disease

## 2023-03-30 ENCOUNTER — Ambulatory Visit (INDEPENDENT_AMBULATORY_CARE_PROVIDER_SITE_OTHER): Payer: Medicare PPO | Admitting: Cardiovascular Disease

## 2023-03-30 VITALS — BP 135/76 | HR 87 | Ht 63.0 in | Wt 134.0 lb

## 2023-03-30 DIAGNOSIS — R899 Unspecified abnormal finding in specimens from other organs, systems and tissues: Secondary | ICD-10-CM | POA: Diagnosis not present

## 2023-03-30 DIAGNOSIS — I998 Other disorder of circulatory system: Secondary | ICD-10-CM

## 2023-03-30 DIAGNOSIS — I340 Nonrheumatic mitral (valve) insufficiency: Secondary | ICD-10-CM

## 2023-03-30 DIAGNOSIS — I6529 Occlusion and stenosis of unspecified carotid artery: Secondary | ICD-10-CM | POA: Diagnosis not present

## 2023-03-30 DIAGNOSIS — I2510 Atherosclerotic heart disease of native coronary artery without angina pectoris: Secondary | ICD-10-CM

## 2023-03-30 DIAGNOSIS — I739 Peripheral vascular disease, unspecified: Secondary | ICD-10-CM

## 2023-03-30 NOTE — Progress Notes (Signed)
Cardiology Office Note   Date:  03/30/2023   ID:  Sara, [name] 04/29/1951, MRN [id number]  PCP:  Orson Eva, NP  Cardiologist:  Adrian Blackwater, MD      History of Present Illness: Sara [name] is a 72 y.o. female who presents for  Chief Complaint  Patient presents with   Follow-up    ECHO & NST results    Shortness of Breath This is a recurrent problem. The current episode started more than 1 year ago. The problem has been rapidly improving.      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 Gilda Crease   Vertigo    Last episode approx Aug 2015   Wears dentures    full upper     Past Surgical History:  Procedure Laterality Date   ADENOIDECTOMY     AMPUTATION Right 08/11/2020   Procedure: AMPUTATION ABOVE KNEE;  Surgeon: Renford Dills, MD;  Location: [facility];  Service: Vascular;  Laterality: Right;   ARTERY BIOPSY Right 07/14/2019   Procedure: BIOPSY TEMPORAL ARTERY;  Surgeon: Renford Dills, MD;  Location: [facility];  Service: Vascular;  Laterality: Right;   BREAST CYST ASPIRATION Left    CARDIAC CATHETERIZATION  02/15/2017   [facility]   COLONOSCOPY WITH PROPOFOL N/A 10/22/2017   Procedure: COLONOSCOPY WITH PROPOFOL;  Surgeon: Midge Minium, MD;  Location: [facility];  Service: Endoscopy;  Laterality: N/A;  specimens not taken--pt on Plavix will be brought back in after 7 days off med   COLONOSCOPY WITH PROPOFOL N/A 11/05/2017   Procedure: COLONOSCOPY WITH PROPOFOL;  Surgeon: Midge Minium, MD;  Location: [facility] SUR[facility]ERY [facility];  Service: Endoscopy;   Laterality: N/A;   COLONOSCOPY WITH PROPOFOL N/A 11/13/2022   Procedure: COLONOSCOPY WITH PROPOFOL;  Surgeon: Midge Minium, MD;  Location: [facility] SUR[facility]ERY [facility];  Service: Endoscopy;  Laterality: N/A;   ESOPHAGOGASTRODUODENOSCOPY N/A 12/21/2014   Procedure: ESOPHAGOGASTRODUODENOSCOPY (EGD);  Surgeon: Midge Minium, MD;  Location: [facility]ERY [facility];  Service: Gastroenterology;  Laterality: N/A;   ESOPHAGOGASTRODUODENOSCOPY (EGD) WITH PROPOFOL N/A 02/08/2016   Procedure: ESOPHAGOGASTRODUODENOSCOPY (EGD) WITH PROPOFOL;  Surgeon: Midge Minium, MD;  Location: [facility];  Service: Endoscopy;  Laterality: N/A;   FASCIOTOMY Right 05/30/2019   Procedure: FASCIOTOMY;  Surgeon: Renford Dills, MD;  Location: [facility];  Service: Vascular;  Laterality: Right;   FASCIOTOMY CLOSURE Right 06/04/2019   Procedure: FASCIOTOMY CLOSURE;  Surgeon: Renford Dills, MD;  Location: [facility];  Service: Vascular;  Laterality: Right;   HEMORROIDECTOMY  2014   LOWER EXTREMITY ANGIOGRAPHY Right 05/30/2019   Procedure: LOWER EXTREMITY ANGIOGRAPHY;  Surgeon: Renford Dills, MD;  Location: [facility];  Service: Cardiovascular;  Laterality: Right;   LOWER EXTREMITY ANGIOGRAPHY Right 07/02/2020   Procedure: LOWER EXTREMITY ANGIOGRAPHY;  Surgeon: Renford Dills, MD;  Location: [facility];  Service: Cardiovascular;  Laterality: Right;   PERIPHERAL VASCULAR CATHETERIZATION N/A 02/09/2016   Procedure: Abdominal Aortogram w/Lower Extremity;  Surgeon: Renford Dills,  MD;  Location: [facility];  Service: Cardiovascular;  Laterality: N/A;   PERIPHERAL VASCULAR CATHETERIZATION Right 02/10/2016   Procedure: Lower Extremity Angiography;  Surgeon: Renford Dills, MD;  Location: [facility];  Service: Cardiovascular;  Laterality: Right;   POLYPECTOMY  11/05/2017   Procedure: POLYPECTOMY;  Surgeon: Midge Minium, MD;  Location: [facility] SURGERY [facility];  Service: Endoscopy;;   POLYPECTOMY   11/13/2022   Procedure: POLYPECTOMY;  Surgeon: Midge Minium, MD;  Location: [facility] SURGERY [facility];  Service: Endoscopy;;   SHOULDER ARTHROSCOPY WITH ROTATOR CUFF REPAIR AND SUBACROMIAL DECOMPRESSION Right 02/27/2020   Procedure: RIGHT SHOULDER ARTHROSCOPY SUBACROMIAL DECOMPRESSION, DISTAL CLAVICLE EXCISION AND MINI-OPEN ROTATOR CUFF REPAIR;  Surgeon: Juanell Fairly, MD;  Location: [facility];  Service: Orthopedics;  Laterality: Right;   TONSILLECTOMY     VASCULAR SURGERY  [id number]   Fem-Pop Bypass     Current Outpatient Medications  Medication Sig Dispense Refill   apixaban (ELIQUIS) 2.5 MG TABS tablet Take 2.5 mg by mouth 2 (two) times daily.     atorvastatin (LIPITOR) 40 MG tablet Take 40 mg by mouth at bedtime.      Calcium Carb-Cholecalciferol 600-10 MG-MCG TABS Take 1 tablet by mouth 2 (two) times daily.     digoxin (LANOXIN) 0.125 MG tablet Take 125 mcg by mouth daily.     DULoxetine (CYMBALTA) 60 MG capsule TAKE 1 CAPSULE BY MOUTH TWICE A DAY 180 capsule 3   gabapentin (NEURONTIN) 800 MG tablet Take 1 tablet (800 mg total) by mouth 3 (three) times daily. 90 tablet 0   ivabradine (CORLANOR) 7.5 MG TABS tablet Take 1 tablet (7.5 mg total) by mouth 2 (two) times daily with a meal. 60 tablet 3   losartan (COZAAR) 25 MG tablet Take 25 mg by mouth every morning.      omeprazole (PRILOSEC) 40 MG capsule Take 40 mg by mouth daily as needed.     Tocilizumab (ACTEMRA ACTPEN) 162 MG/0.9ML SOAJ Inject into the skin.     triamcinolone ointment (KENALOG) 0.5 % Apply 1 Application topically 2 (two) times daily. 30 g 3   zolpidem (AMBIEN) 10 MG tablet TAKE 1 TABLET BY MOUTH NIGHTLY AT BEDTIME AS NEEDED FOR INSOMNIA 30 tablet 1   No current facility-administered medications for this visit.    Allergies:   Hydrocodone-acetaminophen, Amoxicillin, Diltiazem, Metoprolol tartrate, Oxycodone, and Vicodin [hydrocodone-acetaminophen]    Social History:   reports that she quit smoking about 31 years ago. Her  smoking use included cigarettes. She started smoking about 56 years ago. She has a 50 pack-year smoking history. She has never used smokeless tobacco. She reports that she does not drink alcohol and does not use drugs.   Family History:  family history includes CVA in her mother; Cancer in her father; Colon cancer in her father; Heart attack in her mother; Heart disease in her maternal uncle.    ROS:     Review of Systems  Constitutional: Negative.   HENT: Negative.    Eyes: Negative.   Respiratory:  Positive for shortness of breath.   Gastrointestinal: Negative.   Genitourinary: Negative.   Musculoskeletal: Negative.   Skin: Negative.   Neurological: Negative.   Endo/Heme/Allergies: Negative.   Psychiatric/Behavioral: Negative.    All other systems reviewed and are negative.     All other systems are reviewed and negative.    PHYSICAL EXAM: VS:  BP 135/76   Pulse 87   Ht 5\' 3"  (1.6 m)   Wt 134 lb (  60.8 kg)   SpO2 95%   BMI 23.74 kg/m  , BMI Body mass index is 23.74 kg/m. Last weight:  Wt Readings from Last 3 Encounters:  03/30/23 134 lb (60.8 kg)  03/29/23 134 lb (60.8 kg)  02/26/23 138 lb 12.8 oz (63 kg)     Physical Exam Constitutional:      Appearance: Normal appearance.  Cardiovascular:     Rate and Rhythm: Normal rate and regular rhythm.     Heart sounds: Normal heart sounds.  Pulmonary:     Effort: Pulmonary effort is normal.     Breath sounds: Normal breath sounds.  Musculoskeletal:     Right lower leg: No edema.     Left lower leg: No edema.  Neurological:     Mental Status: She is alert.       EKG:   Recent Labs: 10/09/2022: Hemoglobin 14.8; Platelets 197 12/29/2022: TSH 2.290 01/30/2023: ALT 64; BUN 11; Creatinine, Ser 0.81; Potassium 4.8; Sodium 141    Lipid Panel    Component Value Date/Time   CHOL 110 12/29/2022 1204   TRIG 71 12/29/2022 1204   HDL 56 12/29/2022 1204   CHOLHDL 2.0 12/29/2022 1204   LDLCALC 39 12/29/2022 1204       Other studies Reviewed: Additional studies/ records that were reviewed today include:  Review of the above records demonstrates:       No data to display            ASSESSMENT AND PLAN:    ICD-10-CM   1. Angiopathy, 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HCC)  I73.9     2. Stenosis of carotid artery, unspecified laterality  I65.29     3. Coronary artery disease involving native coronary artery of native heart without angina pectoris  I25.10    Stress test normal,echo mild MR/TR    4. Ischemia of extremity  I99.8     5. Ischemic leg  I99.8     6. Non-rheumatic mitral regurgitation  I34.0        Problem List Items Addressed This Visit       Cardiovascular and Mediastinum   Carotid artery narrowing   Angiopathy, 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HCC) - Primary   CAD (coronary artery disease)   Non-rheumatic mitral regurgitation   Ischemic leg   Ischemia of extremity       Disposition:   Return in about 2 months (around 05/30/2023).    Total time spent: 30 minutes  Signed,  Adrian Blackwater, MD  03/30/2023 11:14 AM    [facility]

## 2023-04-02 ENCOUNTER — Other Ambulatory Visit: Payer: Self-pay

## 2023-04-02 MED ORDER — BLOOD PRESSURE CUFF MISC: 1.0000 [IU] | Freq: Every day | 0 refills | Status: AC | PRN

## 2023-04-06 DIAGNOSIS — M899 Disorder of bone, unspecified: Secondary | ICD-10-CM | POA: Diagnosis not present

## 2023-04-06 DIAGNOSIS — M898X8 Other specified disorders of bone, other site: Secondary | ICD-10-CM | POA: Diagnosis not present

## 2023-04-06 DIAGNOSIS — C099 Malignant neoplasm of tonsil, unspecified: Secondary | ICD-10-CM | POA: Diagnosis not present

## 2023-04-25 ENCOUNTER — Other Ambulatory Visit (INDEPENDENT_AMBULATORY_CARE_PROVIDER_SITE_OTHER): Payer: Self-pay | Admitting: Nurse Practitioner

## 2023-04-25 DIAGNOSIS — Z9889 Other specified postprocedural states: Secondary | ICD-10-CM

## 2023-04-25 NOTE — Progress Notes (Signed)
MRN : [id number]  Sara [name] is a 72 y.o. ([birth year]) female who presents with chief complaint of check carotid arteries.  History of Present Illness:   The patient returns to the office for followup and review of the noninvasive studies regarding carotid stenosis and lower extremity occlusive disease.   The carotid stenosis followed by ultrasound.    The patient denies interval amaurosis fugax. There is no recent history of TIA symptoms or focal motor deficits. There is no prior documented CVA.   The patient is taking enteric-coated aspirin 81 mg daily.   There is no history of migraine headaches. There is no history of seizures.   There have been no interval changes in lower extremity symptoms. No interval shortening of the patient's claudication distance or development of rest pain symptoms. No new ulcers or wounds have occurred since the last visit.   The patient has complaints of phantom pains involving the right lower extremity and the sensation that he she is still having excruciating pain in her right foot.  This continues to be her primary complaint.  There have been no significant changes to the patient's overall health care.   There is no history of DVT, PE or superficial thrombophlebitis. The patient denies recent episodes of angina or shortness of breath.    ABI Rt=AKA and Lt=1.10  (previous ABI's Rt=AKA and Lt=1.20 )   Carotid Duplex done today shows 1-39% bilateral ICA stenosis.  No change compared to last study.    No outpatient medications have been marked as taking for the 04/26/23 encounter (Appointment) with Gilda Crease, Latina Craver, MD.    Past Medical History:  Diagnosis Date   Arthritis    right shoulder   Depression    Dyspnea    Employs prosthetic leg    Right   GERD (gastroesophageal reflux disease)    History of blood clots    Hyperlipidemia    Hypertension    MGUS (monoclonal gammopathy of unknown significance)  11/2021   Mild mitral regurgitation    Osteoporosis    Peripheral vascular disease (HCC)    Stomach ulcer    Vascular disease    Sees Dr. Gilda Crease   Vertigo    Last episode approx Aug 2015   Wears dentures    full upper    Past Surgical History:  Procedure Laterality Date   ADENOIDECTOMY     AMPUTATION Right 08/11/2020   Procedure: AMPUTATION ABOVE KNEE;  Surgeon: Renford Dills, MD;  Location: [facility];  Service: Vascular;  Laterality: Right;   ARTERY BIOPSY Right 07/14/2019   Procedure: BIOPSY TEMPORAL ARTERY;  Surgeon: Renford Dills, MD;  Location: [facility];  Service: Vascular;  Laterality: Right;   BREAST CYST ASPIRATION Left    CARDIAC CATHETERIZATION  02/15/2017   [facility]   COLONOSCOPY WITH PROPOFOL N/A 10/22/2017   Procedure: COLONOSCOPY WITH PROPOFOL;  Surgeon: Midge Minium, MD;  Location: [facility];  Service: Endoscopy;  Laterality: N/A;  specimens not taken--pt on Plavix will be brought back in after 7 days off med   COLONOSCOPY WITH PROPOFOL N/A 11/05/2017   Procedure: COLONOSCOPY WITH PROPOFOL;  Surgeon: Midge Minium, MD;  Location: [facility];  Service: Endoscopy;  Laterality: N/A;   COLONOSCOPY WITH PROPOFOL N/A 11/13/2022   Procedure: COLONOSCOPY WITH PROPOFOL;  Surgeon: Midge Minium, MD;  Location: [facility];  Service: Endoscopy;  Laterality: N/A;   ESOPHAGOGASTRODUODENOSCOPY N/A 12/21/2014   Procedure: ESOPHAGOGASTRODUODENOSCOPY (EGD);  Surgeon: Midge Minium, MD;  Location: [facility];  Service: Gastroenterology;  Laterality: N/A;   ESOPHAGOGASTRODUODENOSCOPY (EGD) WITH PROPOFOL N/A 02/08/2016   Procedure: ESOPHAGOGASTRODUODENOSCOPY (EGD) WITH PROPOFOL;  Surgeon: Midge Minium, MD;  Location: [facility];  Service: Endoscopy;  Laterality: N/A;   FASCIOTOMY Right 05/30/2019   Procedure: FASCIOTOMY;  Surgeon: Renford Dills, MD;  Location: [facility];  Service: Vascular;  Laterality: Right;   FASCIOTOMY CLOSURE Right 06/04/2019    Procedure: FASCIOTOMY CLOSURE;  Surgeon: Renford Dills, MD;  Location: [facility];  Service: Vascular;  Laterality: Right;   HEMORROIDECTOMY  2014   LOWER EXTREMITY ANGIOGRAPHY Right 05/30/2019   Procedure: LOWER EXTREMITY ANGIOGRAPHY;  Surgeon: Renford Dills, MD;  Location: [facility];  Service: Cardiovascular;  Laterality: Right;   LOWER EXTREMITY ANGIOGRAPHY Right 07/02/2020   Procedure: LOWER EXTREMITY ANGIOGRAPHY;  Surgeon: Renford Dills, MD;  Location: [facility];  Service: Cardiovascular;  Laterality: Right;   PERIPHERAL VASCULAR CATHETERIZATION N/A 02/09/2016   Procedure: Abdominal Aortogram w/Lower Extremity;  Surgeon: Renford Dills, MD;  Location: [facility];  Service: Cardiovascular;  Laterality: N/A;   PERIPHERAL VASCULAR CATHETERIZATION Right 02/10/2016   Procedure: Lower Extremity Angiography;  Surgeon: Renford Dills, MD;  Location: [facility];  Service: Cardiovascular;  Laterality: Right;   POLYPECTOMY  11/05/2017   Procedure: POLYPECTOMY;  Surgeon: Midge Minium, MD;  Location: [facility];  Service: Endoscopy;;   POLYPECTOMY  11/13/2022   Procedure: POLYPECTOMY;  Surgeon: Midge Minium, MD;  Location: [facility];  Service: Endoscopy;;   SHOULDER ARTHROSCOPY WITH ROTATOR CUFF REPAIR AND SUBACROMIAL DECOMPRESSION Right 02/27/2020   Procedure: RIGHT SHOULDER ARTHROSCOPY SUBACROMIAL DECOMPRESSION, DISTAL CLAVICLE EXCISION AND MINI-OPEN ROTATOR CUFF REPAIR;  Surgeon: Juanell Fairly, MD;  Location: [facility];  Service: Orthopedics;  Laterality: Right;   TONSILLECTOMY     VASCULAR SURGERY  2440,1027   Fem-Pop Bypass    Social History Social History   Tobacco Use   Smoking status: Former    Current packs/day: 0.00    Average packs/day: 2.0 packs/day for 25.0 years (50.0 ttl pk-yrs)    Types: Cigarettes    Start date: 08/21/1966    Quit date: 08/22/1991    Years since quitting: 31.6   Smokeless tobacco: Never   Vaping Use   Vaping status: Never Used  Substance Use Topics   Alcohol use: No    Alcohol/week: 0.0 standard drinks of alcohol   Drug use: Never    Family History Family History  Problem Relation Age of Onset   CVA Mother    Heart attack Mother    Cancer Father        colon cancer   Colon cancer Father    Heart disease Maternal Uncle    Breast cancer Neg Hx     Allergies  Allergen Reactions   Hydrocodone-Acetaminophen     Other reaction(s): Flushing   Amoxicillin Other (See Comments)    Yeast infection   Diltiazem Other (See Comments)    headache   Metoprolol Tartrate Rash   Oxycodone Itching   Vicodin [Hydrocodone-Acetaminophen] Hives and Rash    Flushing     REVIEW OF SYSTEMS (Negative unless checked)  Constitutional: [] Weight loss  [] Fever  [] Chills Cardiac: [] Chest pain   [] Chest pressure   [] Palpitations   [] Shortness of breath when laying flat   [] Shortness of breath  with exertion. Vascular:  [x] Pain in legs with walking   [] Pain in legs at rest  [] History of DVT   [] Phlebitis   [] Swelling in legs   [] Varicose veins   [] Non-healing ulcers Pulmonary:   [] Uses home oxygen   [] Productive cough   [] Hemoptysis   [] Wheeze  [] COPD   [] Asthma Neurologic:  [x] Dizziness   [] Seizures   [] History of stroke   [] History of TIA  [] Aphasia   [] Vissual changes   [] Weakness or numbness in arm   [] Weakness or numbness in leg Musculoskeletal:   [] Joint swelling   [x] Joint pain   [] Low back pain Hematologic:  [] Easy bruising  [] Easy bleeding   [] Hypercoagulable state   [] Anemic Gastrointestinal:  [] Diarrhea   [] Vomiting  [x] Gastroesophageal reflux/heartburn   [] Difficulty swallowing. Genitourinary:  [] Chronic kidney disease   [] Difficult urination  [] Frequent urination   [] Blood in urine Skin:  [] Rashes   [] Ulcers  Psychological:  [] History of anxiety   []  History of major depression.  Physical Examination  There were no vitals filed for this visit. There is no height or weight  on file to calculate BMI. Gen: WD/WN, NAD Head: [place]/AT, No temporalis wasting.  Ear/Nose/Throat: Hearing grossly intact, nares w/o erythema or drainage Eyes: PER, EOMI, sclera nonicteric.  Neck: Supple, no masses.  No bruit or JVD.  Pulmonary:  Good air movement, no audible wheezing, no use of accessory muscles.  Cardiac: RRR, normal S1, S2, no Murmurs. Vascular:  carotid bruit noted Vessel Right Left  Radial Palpable Palpable  Carotid  Palpable  Palpable  DP AKA Palpable  Gastrointestinal: soft, non-distended. No guarding/no peritoneal signs.  Musculoskeletal: M/S 5/5 throughout.  No visible deformity.  Neurologic: CN 2-12 intact. Pain and light touch intact in extremities.  Symmetrical.  Speech is fluent. Motor exam as listed above. Psychiatric: Judgment intact, Mood & affect appropriate for pt's clinical situation. Dermatologic: No rashes or ulcers noted.  No changes consistent with cellulitis.   CBC Lab Results  Component Value Date   WBC 8.1 10/09/2022   HGB 14.8 10/09/2022   HCT 45.6 10/09/2022   MCV 95.8 10/09/2022   PLT 197 10/09/2022    BMET    Component Value Date/Time   NA 141 01/30/2023 1306   K 4.8 01/30/2023 1306   CL 107 (H) 01/30/2023 1306   CO2 20 01/30/2023 1306   GLUCOSE 83 01/30/2023 1306   GLUCOSE 99 10/09/2022 1426   BUN 11 01/30/2023 1306   CREATININE 0.81 01/30/2023 1306   CREATININE 0.88 05/16/2016 1034   CALCIUM 9.3 01/30/2023 1306   GFRNONAA >60 10/09/2022 1426   GFRNONAA 66 05/01/2016 0846   GFRAA >60 02/18/2020 0949   GFRAA 76 05/01/2016 0846   CrCl cannot be calculated (Patient's most recent lab result is older than the maximum 21 days allowed.).  COAG Lab Results  Component Value Date   INR 1.0 08/09/2020   INR 1.3 (H) 02/18/2020   INR 1.0 07/11/2019    Radiology No results found.   Assessment/Plan 1. 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  The patient does not voice lifestyle limiting changes at this point in time.  Noninvasive studies do not suggest clinically significant change.  No invasive studies, angiography or surgery at this time The patient should continue walking and begin a more formal exercise program.  The patient should continue antiplatelet therapy and aggressive treatment  of the lipid abnormalities  No changes in the patient's medications at this time  Continued surveillance is indicated as atherosclerosis is likely to progress with time.    The patient will continue follow up with noninvasive studies as ordered.   2. Bilateral carotid artery stenosis Recommend:   Given the patient's asymptomatic subcritical stenosis no further invasive testing or surgery at this time.   Duplex ultrasound shows 1-39% stenosis bilaterally.   Continue antiplatelet therapy as prescribed Continue management of CAD, HTN and Hyperlipidemia Healthy heart diet,  encouraged exercise at least 4 times per week Follow up in 24 months with duplex ultrasound and physical exam   3. Phantom pain I will place a referral to asked Dr. Cherylann Ratel to evaluate for an implantable stimulator - Ambulatory referral to Pain Clinic  4. S/P AKA (above knee amputation), right (HCC) Currently her prosthesis is well-fitting no further episodes of blistering.  5. Coronary artery disease involving native coronary artery of native heart without angina pectoris Continue cardiac and antihypertensive medications as already ordered and reviewed, no changes at this time.  Continue statin as ordered and reviewed, no changes at this time  Nitrates PRN for chest pain    Levora Dredge, MD  04/25/2023 11:13 AM

## 2023-04-26 ENCOUNTER — Ambulatory Visit (INDEPENDENT_AMBULATORY_CARE_PROVIDER_SITE_OTHER): Payer: Medicare PPO

## 2023-04-26 ENCOUNTER — Ambulatory Visit (INDEPENDENT_AMBULATORY_CARE_PROVIDER_SITE_OTHER): Payer: Medicare PPO | Admitting: Vascular Surgery

## 2023-04-26 ENCOUNTER — Encounter (INDEPENDENT_AMBULATORY_CARE_PROVIDER_SITE_OTHER): Payer: Self-pay | Admitting: Vascular Surgery

## 2023-04-26 VITALS — BP 110/68 | HR 69 | Resp 16

## 2023-04-26 DIAGNOSIS — I2510 Atherosclerotic heart disease of native coronary artery without angina pectoris: Secondary | ICD-10-CM

## 2023-04-26 DIAGNOSIS — Z9889 Other specified postprocedural states: Secondary | ICD-10-CM | POA: Diagnosis not present

## 2023-04-26 DIAGNOSIS — I739 Peripheral vascular disease, unspecified: Secondary | ICD-10-CM

## 2023-04-26 DIAGNOSIS — R52 Pain, unspecified: Secondary | ICD-10-CM

## 2023-04-26 DIAGNOSIS — Z89611 Acquired absence of right leg above knee: Secondary | ICD-10-CM | POA: Diagnosis not present

## 2023-04-26 DIAGNOSIS — I70212 Atherosclerosis of native arteries of extremities with intermittent claudication, left leg: Secondary | ICD-10-CM

## 2023-04-26 DIAGNOSIS — G548 Other nerve root and plexus disorders: Secondary | ICD-10-CM | POA: Diagnosis not present

## 2023-04-26 DIAGNOSIS — I6523 Occlusion and stenosis of bilateral carotid arteries: Secondary | ICD-10-CM | POA: Diagnosis not present

## 2023-04-29 ENCOUNTER — Encounter (INDEPENDENT_AMBULATORY_CARE_PROVIDER_SITE_OTHER): Payer: Self-pay | Admitting: Vascular Surgery

## 2023-04-30 ENCOUNTER — Ambulatory Visit: Payer: Medicare PPO

## 2023-04-30 LAB — VAS US ABI WITH/WO TBI: Left ABI: 1.1

## 2023-05-03 ENCOUNTER — Ambulatory Visit: Payer: Medicare PPO | Admitting: Cardiology

## 2023-05-04 DIAGNOSIS — D869 Sarcoidosis, unspecified: Secondary | ICD-10-CM | POA: Diagnosis not present

## 2023-05-04 DIAGNOSIS — D472 Monoclonal gammopathy: Secondary | ICD-10-CM | POA: Diagnosis not present

## 2023-05-04 DIAGNOSIS — M899 Disorder of bone, unspecified: Secondary | ICD-10-CM | POA: Diagnosis not present

## 2023-05-08 ENCOUNTER — Ambulatory Visit (INDEPENDENT_AMBULATORY_CARE_PROVIDER_SITE_OTHER): Payer: Medicare PPO | Admitting: Cardiology

## 2023-05-08 ENCOUNTER — Encounter: Payer: Self-pay | Admitting: Cardiology

## 2023-05-08 VITALS — BP 118/68 | HR 108 | Ht 63.0 in | Wt 134.0 lb

## 2023-05-08 DIAGNOSIS — R7303 Prediabetes: Secondary | ICD-10-CM | POA: Diagnosis not present

## 2023-05-08 DIAGNOSIS — R21 Rash and other nonspecific skin eruption: Secondary | ICD-10-CM

## 2023-05-08 DIAGNOSIS — E782 Mixed hyperlipidemia: Secondary | ICD-10-CM

## 2023-05-08 NOTE — Progress Notes (Signed)
Established Patient Office Visit  Subjective:  Patient ID: Sara [name], female    DOB: 1950/10/17  Age: 71 y.o. MRN: [id number]  Chief Complaint  Patient presents with   Follow-up    4 month follow up    Patient in office for 4 month follow up. Patient doing well. Patient continues to have erythematous papules on chest and face. Patient scheduled with dermatology but not until 04/2024. Will send new referral to a different office to try to get her a sooner appointment. Patient had blood work at [facility] this week. Did not have Hgb A1c done, or lipid panel. Patient will return for fasting labs. Will call with results.  Patient up to date on mammogram and colonoscopy.     No other concerns at this time.   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 Gilda Crease   Vertigo    Last episode approx Aug 2015   Wears dentures    full upper    Past Surgical History:  Procedure Laterality Date   ADENOIDECTOMY     AMPUTATION Right 08/11/2020   Procedure: AMPUTATION ABOVE KNEE;  Surgeon: Renford Dills, MD;  Location: [facility];  Service: Vascular;  Laterality: Right;   ARTERY BIOPSY Right 07/14/2019   Procedure: BIOPSY TEMPORAL ARTERY;  Surgeon: Renford Dills, MD;  Location: [facility];  Service: Vascular;  Laterality: Right;   BREAST CYST ASPIRATION Left    CARDIAC CATHETERIZATION  02/15/2017   [facility]   COLONOSCOPY WITH PROPOFOL N/A 10/22/2017   Procedure: COLONOSCOPY WITH PROPOFOL;  Surgeon: Midge Minium, MD;  Location: [facility];  Service: Endoscopy;  Laterality: N/A;  specimens not  taken--pt on Plavix will be brought back in after 7 days off med   COLONOSCOPY WITH PROPOFOL N/A 11/05/2017   Procedure: COLONOSCOPY WITH PROPOFOL;  Surgeon: Midge Minium, MD;  Location: [facility];  Service: Endoscopy;  Laterality: N/A;   COLONOSCOPY WITH PROPOFOL N/A 11/13/2022   Procedure: COLONOSCOPY WITH PROPOFOL;  Surgeon: Midge Minium, MD;  Location: [facility];  Service: Endoscopy;  Laterality: N/A;   ESOPHAGOGASTRODUODENOSCOPY N/A 12/21/2014   Procedure: ESOPHAGOGASTRODUODENOSCOPY (EGD);  Surgeon: Midge Minium, MD;  Location: [facility];  Service: Gastroenterology;  Laterality: N/A;   ESOPHAGOGASTRODUODENOSCOPY (EGD) WITH PROPOFOL N/A 02/08/2016   Procedure: ESOPHAGOGASTRODUODENOSCOPY (EGD) WITH PROPOFOL;  Surgeon: Midge Minium, MD;  Location: [facility];  Service: Endoscopy;  Laterality: N/A;   FASCIOTOMY Right 05/30/2019   Procedure: FASCIOTOMY;  Surgeon: Renford Dills, MD;  Location: [facility];  Service: Vascular;  Laterality: Right;   FASCIOTOMY CLOSURE Right 06/04/2019   Procedure: FASCIOTOMY CLOSURE;  Surgeon: Renford Dills, MD;  Location: [facility];  Service: Vascular;  Laterality: Right;   HEMORROIDECTOMY  2014   LOWER EXTREMITY ANGIOGRAPHY Right 05/30/2019   Procedure: LOWER EXTREMITY ANGIOGRAPHY;  Surgeon: Renford Dills, MD;  Location: [facility];  Service: Cardiovascular;  Laterality: Right;   LOWER EXTREMITY ANGIOGRAPHY Right 07/02/2020   Procedure: LOWER EXTREMITY ANGIOGRAPHY;  Surgeon: Renford Dills, MD;  Location: [facility];  Service: Cardiovascular;  Laterality: Right;   PERIPHERAL VASCULAR CATHETERIZATION N/A 02/09/2016   Procedure: Abdominal Aortogram w/Lower Extremity;  Surgeon: Renford Dills, MD;  Location: [facility];  Service: Cardiovascular;  Laterality: N/A;   PERIPHERAL VASCULAR CATHETERIZATION Right 02/10/2016   Procedure: Lower Extremity Angiography;  Surgeon: Renford Dills, MD;   Location: [facility];  Service: Cardiovascular;  Laterality: Right;   POLYPECTOMY  11/05/2017   Procedure: POLYPECTOMY;  Surgeon: Midge Minium, MD;  Location: [facility];  Service: Endoscopy;;   POLYPECTOMY  11/13/2022   Procedure: POLYPECTOMY;  Surgeon: Midge Minium, MD;  Location: [facility];  Service: Endoscopy;;   SHOULDER ARTHROSCOPY WITH ROTATOR CUFF REPAIR AND SUBACROMIAL DECOMPRESSION Right 02/27/2020   Procedure: RIGHT SHOULDER ARTHROSCOPY SUBACROMIAL DECOMPRESSION, DISTAL CLAVICLE EXCISION AND MINI-OPEN ROTATOR CUFF REPAIR;  Surgeon: Juanell Fairly, MD;  Location: [facility];  Service: Orthopedics;  Laterality: Right;   TONSILLECTOMY     VASCULAR SURGERY  [id number]   Fem-Pop Bypass    Social History   Socioeconomic History   Marital status: Single    Spouse name: Not on file   Number of children: Not on file   Years of education: Not on file   Highest education level: Not on file  Occupational History   Not on file  Tobacco Use   Smoking status: Former    Current packs/day: 0.00    Average packs/day: 2.0 packs/day for 25.0 years (50.0 ttl pk-yrs)    Types: Cigarettes    Start date: 08/21/1966    Quit date: 08/22/1991    Years since quitting: 31.7   Smokeless tobacco: Never  Vaping Use   Vaping status: Never Used  Substance and Sexual Activity   Alcohol use: No    Alcohol/week: 0.0 standard drinks of alcohol   Drug use: Never   Sexual activity: Not Currently  Other Topics Concern   Not on file  Social History Narrative   Not on file   Social Determinants of Health   Financial Resource Strain: Not on file  Food Insecurity: Not on file  Transportation Needs: Not on file  Physical Activity: Not on file  Stress: Not on file  Social Connections: Not on file  Intimate Partner Violence: Not on file    Family History  Problem Relation Age of Onset   CVA Mother    Heart attack Mother    Cancer Father        colon cancer   Colon cancer  Father    Heart disease Maternal Uncle    Breast cancer Neg Hx     Allergies  Allergen Reactions   Hydrocodone-Acetaminophen     Other reaction(s): Flushing   Diltiazem Other (See Comments)    headache    Review of Systems  Constitutional: Negative.   HENT: Negative.    Eyes: Negative.   Respiratory: Negative.  Negative for shortness of breath.   Cardiovascular: Negative.  Negative for chest pain.  Gastrointestinal: Negative.  Negative for abdominal pain, constipation and diarrhea.  Genitourinary: Negative.   Musculoskeletal:  Negative for joint pain and myalgias.  Skin: Negative.   Neurological: Negative.  Negative for dizziness and headaches.  Endo/Heme/Allergies: Negative.   All other systems reviewed and are negative.      Objective:   BP 118/68 (BP Location: Left Arm, Patient Position: Sitting, Cuff Size: Small)   Pulse (!) 108   Ht 5\' 3"  (  1.6 m)   Wt 134 lb (60.8 kg)   SpO2 96%   BMI 23.74 kg/m   Vitals:   05/08/23 0928 05/08/23 0949  BP: (!) 160/78 118/68  Pulse: (!) 108   Height: 5\' 3"  (1.6 m)   Weight: 134 lb (60.8 kg)   SpO2: 96%   BMI (Calculated): 23.74     Physical Exam Vitals and nursing note reviewed.  Constitutional:      Appearance: Normal appearance. She is normal weight.  HENT:     Head: Normocephalic and atraumatic.     Nose: Nose normal.     Mouth/Throat:     Mouth: Mucous membranes are moist.  Eyes:     Extraocular Movements: Extraocular movements intact.     Conjunctiva/sclera: Conjunctivae normal.     Pupils: Pupils are equal, round, and reactive to light.  Cardiovascular:     Rate and Rhythm: Normal rate and regular rhythm.     Pulses: Normal pulses.     Heart sounds: Normal heart sounds.  Pulmonary:     Effort: Pulmonary effort is normal.     Breath sounds: Normal breath sounds.  Abdominal:     General: Abdomen is flat. Bowel sounds are normal.     Palpations: Abdomen is soft.  Musculoskeletal:        General: Normal  range of motion.     Cervical back: Normal range of motion.  Skin:    General: Skin is warm and dry.  Neurological:     General: No focal deficit present.     Mental Status: She is alert and oriented to person, place, and time.  Psychiatric:        Mood and Affect: Mood normal.        Behavior: Behavior normal.        Thought Content: Thought content normal.        Judgment: Judgment normal.      No results found for any visits on 05/08/23.  Recent Results (from the past 2160 hour(s))  VAS [place] ABI WITH/WO TBI     Status: None   Collection Time: 04/26/23  1:56 PM  Result Value Ref Range   Right ABI AKA    Left ABI 1.10       Assessment & Plan:  Return for fasting lab work.  New referral sent to different dermatology.   Problem List Items Addressed This Visit       Musculoskeletal and Integument   Papular rash   Relevant Orders   Ambulatory referral to Dermatology     Other   Hyperlipidemia - Primary   Relevant Orders   Lipid panel   TSH   Prediabetes   Relevant Orders   Hemoglobin A1c   TSH    Return in about 4 months (around 09/07/2023) for with fasting labs  prior.   Total time spent: 25 minutes  [employer], NP  05/08/2023   This document may have been prepared by Dragon Voice Recognition software and as such may include unintentional dictation errors.

## 2023-05-15 ENCOUNTER — Other Ambulatory Visit: Payer: Medicare PPO

## 2023-05-15 DIAGNOSIS — E782 Mixed hyperlipidemia: Secondary | ICD-10-CM

## 2023-05-15 DIAGNOSIS — R7303 Prediabetes: Secondary | ICD-10-CM

## 2023-05-16 LAB — TSH: TSH: 2.11 u[IU]/mL (ref 0.450–4.500)

## 2023-05-16 LAB — HEMOGLOBIN A1C
Est. average glucose Bld gHb Est-mCnc: 134 mg/dL
Hgb A1c MFr Bld: 6.3 % — ABNORMAL HIGH (ref 4.8–5.6)

## 2023-05-16 LAB — LIPID PANEL
Chol/HDL Ratio: 2.1 ratio (ref 0.0–4.4)
Cholesterol, Total: 110 mg/dL (ref 100–199)
HDL: 52 mg/dL (ref >39)
LDL Chol Calc (NIH): 44 mg/dL (ref 0–99)
Triglycerides: 67 mg/dL (ref 0–149)
VLDL Cholesterol Cal: 14 mg/dL (ref 5–40)

## 2023-05-16 NOTE — Progress Notes (Signed)
Patient notified

## 2023-05-21 DIAGNOSIS — L942 Calcinosis cutis: Secondary | ICD-10-CM | POA: Diagnosis not present

## 2023-05-21 DIAGNOSIS — L282 Other prurigo: Secondary | ICD-10-CM | POA: Diagnosis not present

## 2023-05-21 DIAGNOSIS — L249 Irritant contact dermatitis, unspecified cause: Secondary | ICD-10-CM | POA: Diagnosis not present

## 2023-05-22 ENCOUNTER — Ambulatory Visit
Admission: RE | Admit: 2023-05-22 | Discharge: 2023-05-22 | Disposition: A | Discharge status: Home / Self Care | Payer: Medicare PPO | Source: Ambulatory Visit | Attending: Student in an Organized Health Care Education/Training Program | Admitting: Student in an Organized Health Care Education/Training Program

## 2023-05-22 ENCOUNTER — Ambulatory Visit
Payer: Medicare PPO | Attending: Student in an Organized Health Care Education/Training Program | Admitting: Student in an Organized Health Care Education/Training Program

## 2023-05-22 ENCOUNTER — Encounter: Payer: Self-pay | Admitting: Student in an Organized Health Care Education/Training Program

## 2023-05-22 VITALS — BP 133/73 | HR 85 | Temp 97.7°F | Resp 18 | Ht 63.0 in | Wt 132.0 lb

## 2023-05-22 DIAGNOSIS — I739 Peripheral vascular disease, unspecified: Secondary | ICD-10-CM | POA: Insufficient documentation

## 2023-05-22 DIAGNOSIS — G894 Chronic pain syndrome: Secondary | ICD-10-CM | POA: Diagnosis not present

## 2023-05-22 DIAGNOSIS — G546 Phantom limb syndrome with pain: Secondary | ICD-10-CM | POA: Insufficient documentation

## 2023-05-22 DIAGNOSIS — Z89612 Acquired absence of left leg above knee: Secondary | ICD-10-CM | POA: Diagnosis not present

## 2023-05-22 DIAGNOSIS — M792 Neuralgia and neuritis, unspecified: Secondary | ICD-10-CM | POA: Diagnosis not present

## 2023-05-22 NOTE — Progress Notes (Signed)
PROVIDER NOTE: Information contained herein reflects review and annotations entered in association with encounter. Interpretation of such information and data should be left to medically-trained personnel. Information provided to patient can be located elsewhere in the medical record under "Patient Instructions". Document created using STT-dictation technology, any transcriptional errors that may result from process are unintentional.    Patient: Sara [name]  Service Category: E/M  Provider: Edward Jolly, MD  DOB: 06/16/1951  DOS: 05/22/2023  Referring Provider: Renford Dills, MD  MRN: [id number]  Specialty: Interventional Pain Management  PCP: Orson Eva, NP (Inactive)  Type: Established Patient  Setting: Ambulatory outpatient    Location: Office  Delivery: Face-to-face     HPI  Sara [name], a 72 y.o. 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 Sara [name] primary complain today is Foot Pain (right)  Pertinent problems: Sara [name] has Phantom pain after amputation of lower extremity (HCC); Rotator cuff arthropathy of right shoulder; Hx of rotator cuff surgery; Chronic pain syndrome; Thoracic back pain; and Thoracic facet joint syndrome on their pertinent problem list. Pain Assessment: Severity of Phantom pain is reported as a 8 /10. Location: Foot Right/ . Onset: More than a month ago. Quality: Throbbing. Timing: Constant. Modifying factor(s): Lidocaine patch, Gabapentin. Vitals:  height is 5\' 3"  (1.6 m) and weight is 132 lb (59.9 kg). Her temporal temperature is 97.7 F (36.5 C). Her blood pressure is 133/73 and her pulse is 85. Her respiration is 18 and oxygen saturation is 96%.  BMI: Estimated body mass index is 23.38 kg/m as calculated from the following:   Height as of this encounter: 5\' 3"  (1.6 m).   Weight as of this encounter: 132 lb (59.9 kg). Last encounter: 11/15/2022. Last procedure:  10/18/2022.  Reason for encounter: patient-requested evaluation.   Patient presents today for worsening right phantom limb pain and to discuss spinal cord stimulation.  ROS  Constitutional: Denies any fever or chills Gastrointestinal: No reported hemesis, hematochezia, vomiting, or acute GI distress Musculoskeletal: Denies any acute onset joint swelling, redness, loss of ROM, or weakness Neurological:  Right phantom limb pain  Medication Review  Blood Pressure Cuff, Calcium Carb-Cholecalciferol, DULoxetine, Tocilizumab, apixaban, atorvastatin, digoxin, gabapentin, ivabradine, lidocaine, losartan, omeprazole, triamcinolone ointment, and zolpidem  History Review  Allergy: Sara [name] is allergic to hydrocodone-acetaminophen and diltiazem. Drug: Sara [name]  reports no history of drug use. Alcohol:  reports no history of alcohol use. Tobacco:  reports that she quit smoking about 31 years ago. Her smoking use included cigarettes. She started smoking about 56 years ago. She has a 50 pack-year smoking history. She has never used smokeless tobacco. Social: Sara [name]  reports that she quit smoking about 31 years ago. Her smoking use included cigarettes. She started smoking about 56 years ago. She has a 50 pack-year smoking history. She has never used smokeless tobacco. She reports that she does not drink alcohol and does not use drugs. 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 Surgical: Sara [name]  has a past surgical history that includes Vascular surgery [phone]); Hemorroidectomy (2014); Esophagogastroduodenoscopy (N/A, 12/21/2014);  Esophagogastroduodenoscopy (egd) with propofol (N/A, 02/08/2016); Cardiac catheterization (N/A, 02/09/2016); Cardiac catheterization (Right, 02/10/2016); Cardiac catheterization (02/15/2017); Colonoscopy with  propofol (N/A, 10/22/2017); Colonoscopy with propofol (N/A, 11/05/2017); polypectomy (11/05/2017); Tonsillectomy; Adenoidectomy; Lower Extremity Angiography (Right, 05/30/2019); Fasciotomy closure (Right, 06/04/2019); Artery Biopsy (Right, 07/14/2019); Fasciotomy (Right, 05/30/2019); Breast cyst aspiration (Left); Shoulder arthroscopy with rotator cuff repair and subacromial decompression (Right, 02/27/2020); Lower Extremity Angiography (Right, 07/02/2020); Amputation (Right, 08/11/2020); Colonoscopy with propofol (N/A, 11/13/2022); and polypectomy (11/13/2022). Family: family history includes CVA in her mother; Cancer in her father; Colon cancer in her father; Heart attack in her mother; Heart disease in her maternal uncle.  Laboratory Chemistry Profile   Renal Lab Results  Component Value Date   BUN 11 01/30/2023   CREATININE 0.81 01/30/2023   BCR 14 01/30/2023   GFRAA >60 02/18/2020   GFRNONAA >60 10/09/2022    Hepatic Lab Results  Component Value Date   AST 56 (H) 01/30/2023   ALT 64 (H) 01/30/2023   ALBUMIN 4.4 01/30/2023   ALKPHOS 74 01/30/2023   LIPASE 48 10/09/2022    Electrolytes Lab Results  Component Value Date   NA 141 01/30/2023   K 4.8 01/30/2023   CL 107 (H) 01/30/2023   CALCIUM 9.3 01/30/2023   MG 1.9 07/03/2020   PHOS 3.6 06/02/2019    Bone Lab Results  Component Value Date   VD25OH 45.8 09/21/2015    Inflammation (CRP: Acute Phase) (ESR: Chronic Phase) Lab Results  Component Value Date   ESRSEDRATE 58 (H) 07/03/2019   LATICACIDVEN 0.8 06/01/2019         Note: Above Lab results reviewed.   Physical Exam  General appearance: Well nourished, well developed, and well hydrated. In no apparent acute distress Mental status: Alert, oriented x 3 (person, place, &  time)       Respiratory: No evidence of acute respiratory distress Eyes: PERLA Vitals: BP 133/73   Pulse 85   Temp 97.7 F (36.5 C) (Temporal)   Resp 18   Ht 5\' 3"  (1.6 m)   Wt 132 lb (59.9 kg)   SpO2 96%   BMI 23.38 kg/m  BMI: Estimated body mass index is 23.38 kg/m as calculated from the following:   Height as of this encounter: 5\' 3"  (1.6 m).   Weight as of this encounter: 132 lb (59.9 kg). Ideal: Ideal body weight: 52.4 kg (115 lb 8.3 oz) Adjusted ideal body weight: 55.4 kg (122 lb 1.8 oz)  Lumbar Spine Area Exam  Skin & Axial Inspection: No masses, redness, or swelling Alignment: Symmetrical Functional ROM: Pain restricted ROM       Stability: No instability detected Muscle Tone/Strength: Functionally intact. No obvious neuro-muscular anomalies detected. Sensory (Neurological): Musculoskeletal pain pattern   Gait & Posture Assessment  Ambulation: Unassisted Gait: Relatively normal for age and body habitus Posture: WNL  Lower Extremity Exam      Side: Right lower extremity   Side: Left lower extremity  Stability: No instability observed           Stability: No instability observed          Skin & Extremity Inspection: Above knee amputation (AKA)   Skin & Extremity Inspection: Skin color, temperature, and hair growth are WNL. No peripheral edema or cyanosis. No masses, redness, swelling, asymmetry, or associated skin lesions. No contractures.  Functional ROM: Pain restricted ROM                   Functional ROM: Unrestricted ROM                  Muscle Tone/Strength: Functionally intact.  No obvious neuro-muscular anomalies detected.   Muscle Tone/Strength: Functionally intact. No obvious neuro-muscular anomalies detected.  Sensory (Neurological): Neurogenic pain pattern         Sensory (Neurological): Unimpaired        DTR: Patellar: deferred today Achilles: deferred today Plantar: deferred today   DTR: Patellar: deferred today Achilles: deferred today Plantar:  deferred today  Palpation: No palpable anomalies   Palpation: No palpable anomalies    Assessment   Diagnosis Status  1. 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 Neuropathic pain   3. PVD (peripheral vascular disease) (HCC)   4. History of left above knee amputation [facility]) (Dec 2021)   5. Chronic pain syndrome    Having a Flare-up Having a Flare-up Having a Flare-up   Updated Problems: Problem  Phantom Pain After Amputation of Lower Extremity (Hcc)  Neuropathic Pain    Plan of Care  I discussed  percutaneous spinal cord stimulator trial with the patient in detail.  I was able to evaluate her interlaminar windows under live fluoroscopy and they appear patent for percutaneous access.  I explained to the patient that they will have an external power source and programmer which the patient will use for 7 days. There will be daily communication with the stimulator company and the patient. A possible need for a mid-trial clinic visit to give the patient the best chance of success.   Patient will need to have a thorough psychosocial behavioral evaluation. Our office will be happy to help facilitate this. Will place referral to [facility].  Patient states that she has an upcoming MRI of her lumbar spine.  I instructed her to reach out to the ordering physician to see if they can also add on a thoracic MRI for spinal cord stimulator trial and potential implant planning.  I provided the patient with resources for spinal cord stimulation.  I instructed her to contact us after she has completed her MRI studies and her psych eval at which point I can place an order for spinal cord stimulation.  Orders:  Orders Placed This Encounter  Procedures   DG PAIN CLINIC C-ARM 1-60 MIN NO REPORT    Intraoperative interpretation by procedural physician at [facility].    Standing Status:   Standing    Number of Occurrences:   1    Order Specific Question:   Reason for  exam:    Answer:   Assistance in needle guidance and placement for procedures requiring needle placement in or near specific anatomical locations not easily accessible without such assistance.   Ambulatory referral to Psychology    Referral Priority:   Routine    Referral Type:   Psychiatric    Referral Reason:   Specialty Services Required    Requested Specialty:   Psychology    Number of Visits Requested:   1   Follow-up plan:   Return for pt will notify us after she has completed psych eval and Thoracic and Lumbar MRi.      Recent Visits No visits were found meeting these conditions. Showing recent visits within past 90 days and meeting all other requirements Today's Visits Date Type Provider Dept  05/22/23 Office Visit Edward Jolly, MD [facility]  Showing today's visits and meeting all other requirements Future Appointments No visits were found meeting these conditions. Showing future appointments within next 90 days and meeting all other requirements  I discussed the assessment and treatment plan with the  patient. The patient was provided an opportunity to ask questions and all were answered. The patient agreed with the plan and demonstrated an understanding of the instructions.  Patient advised to call back or seek an in-person evaluation if the symptoms or condition worsens.  Duration of encounter: .  Total time on encounter, as per AMA guidelines included both the face-to-face and non-face-to-face time personally spent by the physician and/or other qualified health care professional(s) on the day of the encounter (includes time in activities that require the physician or other qualified health care professional and does not include time in activities normally performed by clinical staff). Physician's time may include the following activities when performed: Preparing to see the patient (e.g., pre-charting review of records, searching for previously ordered  imaging, lab work, and nerve conduction tests) Review of prior analgesic pharmacotherapies. Reviewing PMP Interpreting ordered tests (e.g., lab work, imaging, nerve conduction tests) Performing post-procedure evaluations, including interpretation of diagnostic procedures Obtaining and/or reviewing separately obtained history Performing a medically appropriate examination and/or evaluation Counseling and educating the patient/family/caregiver Ordering medications, tests, or procedures Referring and communicating with other health care professionals (when not separately reported) Documenting clinical information in the electronic or other health record Independently interpreting results (not separately reported) and communicating results to the patient/ family/caregiver Care coordination (not separately reported)  Note by: Edward Jolly, MD Date: 05/22/2023; Time: 1:08 PM

## 2023-05-22 NOTE — Patient Instructions (Signed)
Ask your MD about adding a thoracic MRI since we are considering a spinal cord stimulator trial 2.   Resources for [employer] 3.   [facility] brochure for psych eval for scs trial planning

## 2023-05-30 DIAGNOSIS — M316 Other giant cell arteritis: Secondary | ICD-10-CM | POA: Diagnosis not present

## 2023-05-30 DIAGNOSIS — D869 Sarcoidosis, unspecified: Secondary | ICD-10-CM | POA: Diagnosis not present

## 2023-05-30 DIAGNOSIS — R29898 Other symptoms and signs involving the musculoskeletal system: Secondary | ICD-10-CM | POA: Diagnosis not present

## 2023-05-30 DIAGNOSIS — Z79899 Other long term (current) drug therapy: Secondary | ICD-10-CM | POA: Diagnosis not present

## 2023-05-31 DIAGNOSIS — F4542 Pain disorder with related psychological factors: Secondary | ICD-10-CM | POA: Diagnosis not present

## 2023-06-01 ENCOUNTER — Ambulatory Visit: Payer: Medicare PPO | Admitting: Cardiovascular Disease

## 2023-06-01 ENCOUNTER — Encounter: Payer: Self-pay | Admitting: Cardiovascular Disease

## 2023-06-01 VITALS — BP 130/60 | HR 89 | Ht 63.0 in | Wt 134.0 lb

## 2023-06-01 DIAGNOSIS — R0609 Other forms of dyspnea: Secondary | ICD-10-CM

## 2023-06-01 DIAGNOSIS — R000 Tachycardia, unspecified: Secondary | ICD-10-CM

## 2023-06-01 DIAGNOSIS — Z89611 Acquired absence of right leg above knee: Secondary | ICD-10-CM

## 2023-06-01 DIAGNOSIS — I998 Other disorder of circulatory system: Secondary | ICD-10-CM

## 2023-06-01 DIAGNOSIS — I2510 Atherosclerotic heart disease of native coronary artery without angina pectoris: Secondary | ICD-10-CM | POA: Diagnosis not present

## 2023-06-01 DIAGNOSIS — I739 Peripheral vascular disease, unspecified: Secondary | ICD-10-CM

## 2023-06-01 DIAGNOSIS — R072 Precordial pain: Secondary | ICD-10-CM | POA: Diagnosis not present

## 2023-06-01 DIAGNOSIS — I340 Nonrheumatic mitral (valve) insufficiency: Secondary | ICD-10-CM

## 2023-06-01 DIAGNOSIS — I4711 Inappropriate sinus tachycardia, so stated: Secondary | ICD-10-CM

## 2023-06-01 MED ORDER — IVABRADINE HCL 7.5 MG PO TABS
7.5000 mg | ORAL_TABLET | Freq: Two times a day (BID) | ORAL | 3 refills | Status: DC
Start: 2023-06-01 — End: 2023-06-11

## 2023-06-01 NOTE — Progress Notes (Signed)
Cardiology Office Note   Date:  06/01/2023   ID:  Sara [name], DOB 04-03-1951, MRN [id number]  PCP:  Orson Eva, NP  Cardiologist:  Adrian Blackwater, MD      History of Present Illness: Sara [name] is a 72 y.o. female who presents for  Chief Complaint  Patient presents with   Follow-up    2 mo f/u    Doing well      Past Medical History:  Diagnosis Date   Arthritis    right shoulder   Depression    Dyspnea    Employs prosthetic leg    Right   GERD (gastroesophageal reflux disease)    History of blood clots    Hyperlipidemia    Hypertension    MGUS (monoclonal gammopathy of unknown significance) 11/2021   Mild mitral regurgitation    Osteoporosis    Peripheral vascular disease (HCC)    Stomach ulcer    Vascular disease    Sees Dr. Gilda Crease   Vertigo    Last episode approx Aug 2015   Wears dentures    full upper     Past Surgical History:  Procedure Laterality Date   ADENOIDECTOMY     AMPUTATION Right 08/11/2020   Procedure: AMPUTATION ABOVE KNEE;  Surgeon: Renford Dills, MD;  Location: [facility];  Service: Vascular;  Laterality: Right;   ARTERY BIOPSY Right 07/14/2019   Procedure: BIOPSY TEMPORAL ARTERY;  Surgeon: Renford Dills, MD;  Location: [facility];  Service: Vascular;  Laterality: Right;   BREAST CYST ASPIRATION Left    CARDIAC CATHETERIZATION  02/15/2017   [facility]   COLONOSCOPY WITH PROPOFOL N/A 10/22/2017   Procedure: COLONOSCOPY WITH PROPOFOL;  Surgeon: Midge Minium, MD;  Location: [facility];  Service: Endoscopy;  Laterality: N/A;  specimens not taken--pt on Plavix will be brought back in after 7 days off med   COLONOSCOPY WITH PROPOFOL N/A 11/05/2017   Procedure: COLONOSCOPY WITH PROPOFOL;  Surgeon: Midge Minium, MD;  Location: [facility];  Service: Endoscopy;  Laterality: N/A;   COLONOSCOPY WITH PROPOFOL N/A 11/13/2022   Procedure: COLONOSCOPY WITH PROPOFOL;  Surgeon: Midge Minium, MD;  Location:  [facility];  Service: Endoscopy;  Laterality: N/A;   ESOPHAGOGASTRODUODENOSCOPY N/A 12/21/2014   Procedure: ESOPHAGOGASTRODUODENOSCOPY (EGD);  Surgeon: Midge Minium, MD;  Location: [facility];  Service: Gastroenterology;  Laterality: N/A;   ESOPHAGOGASTRODUODENOSCOPY (EGD) WITH PROPOFOL N/A 02/08/2016   Procedure: ESOPHAGOGASTRODUODENOSCOPY (EGD) WITH PROPOFOL;  Surgeon: Midge Minium, MD;  Location: [facility];  Service: Endoscopy;  Laterality: N/A;   FASCIOTOMY Right 05/30/2019   Procedure: FASCIOTOMY;  Surgeon: Renford Dills, MD;  Location: [facility];  Service: Vascular;  Laterality: Right;   FASCIOTOMY CLOSURE Right 06/04/2019   Procedure: FASCIOTOMY CLOSURE;  Surgeon: Renford Dills, MD;  Location: [facility];  Service: Vascular;  Laterality: Right;   HEMORROIDECTOMY  2014   LOWER EXTREMITY ANGIOGRAPHY Right 05/30/2019   Procedure: LOWER EXTREMITY ANGIOGRAPHY;  Surgeon: Renford Dills, MD;  Location: [facility];  Service: Cardiovascular;  Laterality: Right;   LOWER EXTREMITY ANGIOGRAPHY Right 07/02/2020   Procedure: LOWER EXTREMITY ANGIOGRAPHY;  Surgeon: Renford Dills, MD;  Location: [facility];  Service: Cardiovascular;  Laterality: Right;   PERIPHERAL VASCULAR CATHETERIZATION N/A 02/09/2016   Procedure: Abdominal Aortogram w/Lower Extremity;  Surgeon: Renford Dills, MD;  Location: [facility];  Service: Cardiovascular;  Laterality: N/A;   PERIPHERAL VASCULAR CATHETERIZATION Right 02/10/2016  Procedure: Lower Extremity Angiography;  Surgeon: Renford Dills, MD;  Location: [facility];  Service: Cardiovascular;  Laterality: Right;   POLYPECTOMY  11/05/2017   Procedure: POLYPECTOMY;  Surgeon: Midge Minium, MD;  Location: [facility];  Service: Endoscopy;;   POLYPECTOMY  11/13/2022   Procedure: POLYPECTOMY;  Surgeon: Midge Minium, MD;  Location: [facility];  Service: Endoscopy;;   SHOULDER ARTHROSCOPY  WITH ROTATOR CUFF REPAIR AND SUBACROMIAL DECOMPRESSION Right 02/27/2020   Procedure: RIGHT SHOULDER ARTHROSCOPY SUBACROMIAL DECOMPRESSION, DISTAL CLAVICLE EXCISION AND MINI-OPEN ROTATOR CUFF REPAIR;  Surgeon: Juanell Fairly, MD;  Location: [facility];  Service: Orthopedics;  Laterality: Right;   TONSILLECTOMY     VASCULAR SURGERY  [id number]   Fem-Pop Bypass     Current Outpatient Medications  Medication Sig Dispense Refill   apixaban (ELIQUIS) 2.5 MG TABS tablet Take 2.5 mg by mouth 2 (two) times daily.     atorvastatin (LIPITOR) 40 MG tablet Take 40 mg by mouth at bedtime.      Blood Pressure Monitoring (BLOOD PRESSURE CUFF) MISC 1 Units by Does not apply route daily as needed. 1 each 0   Calcium Carb-Cholecalciferol 600-10 MG-MCG TABS Take 1 tablet by mouth 2 (two) times daily.     digoxin (LANOXIN) 0.125 MG tablet Take 125 mcg by mouth daily.     DULoxetine (CYMBALTA) 60 MG capsule TAKE 1 CAPSULE BY MOUTH TWICE A DAY 180 capsule 3   gabapentin (NEURONTIN) 800 MG tablet Take 1 tablet (800 mg total) by mouth 3 (three) times daily. 90 tablet 0   lidocaine (LIDODERM) 5 % Place 1 patch onto the skin daily.     losartan (COZAAR) 25 MG tablet Take 25 mg by mouth every morning.      omeprazole (PRILOSEC) 40 MG capsule Take 40 mg by mouth daily as needed.     Tocilizumab (ACTEMRA ACTPEN) 162 MG/0.9ML SOAJ Inject into the skin.     triamcinolone ointment (KENALOG) 0.5 % Apply 1 Application topically 2 (two) times daily. 30 g 3   zolpidem (AMBIEN) 10 MG tablet TAKE 1 TABLET BY MOUTH NIGHTLY AT BEDTIME AS NEEDED FOR INSOMNIA 30 tablet 1   ivabradine (CORLANOR) 7.5 MG TABS tablet Take 1 tablet (7.5 mg total) by mouth 2 (two) times daily with a meal. 60 tablet 3   No current facility-administered medications for this visit.    Allergies:   Hydrocodone-acetaminophen and Diltiazem    Social History:   reports that she quit smoking about 31 years ago. Her smoking use included cigarettes. She started  smoking about 56 years ago. She has a 50 pack-year smoking history. She has never used smokeless tobacco. She reports that she does not drink alcohol and does not use drugs.   Family History:  family history includes CVA in her mother; Cancer in her father; Colon cancer in her father; Heart attack in her mother; Heart disease in her maternal uncle.    ROS:     Review of Systems  Constitutional: Negative.   HENT: Negative.    Eyes: Negative.   Respiratory: Negative.    Gastrointestinal: Negative.   Genitourinary: Negative.   Musculoskeletal: Negative.   Skin: Negative.   Neurological: Negative.   Endo/Heme/Allergies: Negative.   Psychiatric/Behavioral: Negative.    All other systems reviewed and are negative.     All other systems are reviewed and negative.    PHYSICAL EXAM: VS:  BP 130/60   Pulse 89   Ht 5\' 3"  (1.6 m)  Wt 134 lb (60.8 kg)   SpO2 97%   BMI 23.74 kg/m  , BMI Body mass index is 23.74 kg/m. Last weight:  Wt Readings from Last 3 Encounters:  06/01/23 134 lb (60.8 kg)  05/22/23 132 lb (59.9 kg)  05/08/23 134 lb (60.8 kg)     Physical Exam Constitutional:      Appearance: Normal appearance.  Cardiovascular:     Rate and Rhythm: Normal rate and regular rhythm.     Heart sounds: Normal heart sounds.  Pulmonary:     Effort: Pulmonary effort is normal.     Breath sounds: Normal breath sounds.  Musculoskeletal:     Right lower leg: No edema.     Left lower leg: No edema.  Neurological:     Mental Status: She is alert.       EKG:   Recent Labs: 10/09/2022: Hemoglobin 14.8; Platelets 197 01/30/2023: ALT 64; BUN 11; Creatinine, Ser 0.81; Potassium 4.8; Sodium 141 05/15/2023: TSH 2.110    Lipid Panel    Component Value Date/Time   CHOL 110 05/15/2023 0855   TRIG 67 05/15/2023 0855   HDL 52 05/15/2023 0855   CHOLHDL 2.1 05/15/2023 0855   LDLCALC 44 05/15/2023 0855      Other studies Reviewed: Additional studies/ records that were reviewed  today include:  Review of the above records demonstrates:       No data to display            ASSESSMENT AND PLAN:    ICD-10-CM   1. Peripheral blood vessel disorder (HCC)  I73.9     2. Non-rheumatic mitral regurgitation  I34.0     3. Ischemic leg  I99.8     4. Coronary artery disease involving native coronary artery of native heart without angina pectoris  I25.10     5. S/P AKA (above knee amputation), right (HCC)  Z89.611     6. Dyspnea on exertion  R06.09    doing better    7. Sinus tachycardia  R00.0    heart rate ok today, on digoxin, and corlanor as has PVD and cannot take metoprolol    8. Precordial pain  R07.2     9. Inappropriate sinus tachycardia (HCC)  I47.11 ivabradine (CORLANOR) 7.5 MG TABS tablet       Problem List Items Addressed This Visit       Cardiovascular and Mediastinum   Peripheral blood vessel disorder (HCC) - Primary   Relevant Medications   ivabradine (CORLANOR) 7.5 MG TABS tablet   CAD (coronary artery disease)   Relevant Medications   ivabradine (CORLANOR) 7.5 MG TABS tablet   Non-rheumatic mitral regurgitation   Relevant Medications   ivabradine (CORLANOR) 7.5 MG TABS tablet   Ischemic leg   Relevant Medications   ivabradine (CORLANOR) 7.5 MG TABS tablet   Inappropriate sinus tachycardia (HCC)   Relevant Medications   ivabradine (CORLANOR) 7.5 MG TABS tablet     Other   Dyspnea on exertion   Precordial pain   S/P AKA (above knee amputation), right (HCC)   Sinus tachycardia       Disposition:   Return in about 2 months (around 08/01/2023).    Total time spent: 30 minutes  Signed,  Adrian Blackwater, MD  06/01/2023 11:14 AM    [facility]

## 2023-06-05 ENCOUNTER — Telehealth: Payer: Self-pay

## 2023-06-05 DIAGNOSIS — G546 Phantom limb syndrome with pain: Secondary | ICD-10-CM

## 2023-06-05 DIAGNOSIS — M792 Neuralgia and neuritis, unspecified: Secondary | ICD-10-CM

## 2023-06-05 NOTE — Telephone Encounter (Signed)
Dr Cherylann Ratel would like clearance to stop Eliquis for 3 days for a procedure.  Thank you for your time.

## 2023-06-05 NOTE — Telephone Encounter (Signed)
I have the pysc eval and lumbar mri completed. Do you want to order the scs?

## 2023-06-11 ENCOUNTER — Other Ambulatory Visit: Payer: Self-pay

## 2023-06-11 DIAGNOSIS — I4711 Inappropriate sinus tachycardia, so stated: Secondary | ICD-10-CM

## 2023-06-11 MED ORDER — IVABRADINE HCL 7.5 MG PO TABS
7.5000 mg | ORAL_TABLET | Freq: Two times a day (BID) | ORAL | 3 refills | Status: AC
Start: 2023-06-11 — End: ?

## 2023-06-21 ENCOUNTER — Telehealth: Payer: Self-pay

## 2023-06-21 NOTE — Telephone Encounter (Signed)
Dr Gilda Crease, you gave clearance for this patient to stop her Eliquis for 3 days for a spinal cord stimulator trial.  I neglected to tell you that Dr Cherylann Ratel would like for her to be off the Eliquis for the entire 7 days of the trial plus the 3 days prior.  Do you give clearance for this to be done.  Sorry for the confusion.  Thank you!

## 2023-06-27 NOTE — Telephone Encounter (Signed)
Following up on the request to stop Eliquis for the 10 days of the Spinal Cord Stimulator trial.  Thank you

## 2023-07-04 DIAGNOSIS — M4856XA Collapsed vertebra, not elsewhere classified, lumbar region, initial encounter for fracture: Secondary | ICD-10-CM | POA: Diagnosis not present

## 2023-07-04 DIAGNOSIS — M4854XA Collapsed vertebra, not elsewhere classified, thoracic region, initial encounter for fracture: Secondary | ICD-10-CM | POA: Diagnosis not present

## 2023-07-04 DIAGNOSIS — C7951 Secondary malignant neoplasm of bone: Secondary | ICD-10-CM | POA: Diagnosis not present

## 2023-07-17 DIAGNOSIS — M899 Disorder of bone, unspecified: Secondary | ICD-10-CM | POA: Diagnosis not present

## 2023-07-17 DIAGNOSIS — D869 Sarcoidosis, unspecified: Secondary | ICD-10-CM | POA: Diagnosis not present

## 2023-07-23 ENCOUNTER — Encounter: Payer: Self-pay | Admitting: Student in an Organized Health Care Education/Training Program

## 2023-07-23 ENCOUNTER — Other Ambulatory Visit: Payer: Self-pay

## 2023-07-23 ENCOUNTER — Ambulatory Visit
Admission: RE | Admit: 2023-07-23 | Discharge: 2023-07-23 | Disposition: A | Discharge status: Home / Self Care | Payer: Medicare PPO | Source: Ambulatory Visit | Attending: Student in an Organized Health Care Education/Training Program | Admitting: Student in an Organized Health Care Education/Training Program

## 2023-07-23 ENCOUNTER — Ambulatory Visit
Payer: Medicare PPO | Attending: Student in an Organized Health Care Education/Training Program | Admitting: Student in an Organized Health Care Education/Training Program

## 2023-07-23 VITALS — BP 123/54 | HR 74 | Temp 97.5°F | Resp 20 | Ht 63.0 in | Wt 130.0 lb

## 2023-07-23 DIAGNOSIS — G546 Phantom limb syndrome with pain: Secondary | ICD-10-CM | POA: Diagnosis not present

## 2023-07-23 DIAGNOSIS — M792 Neuralgia and neuritis, unspecified: Secondary | ICD-10-CM | POA: Diagnosis not present

## 2023-07-23 DIAGNOSIS — G894 Chronic pain syndrome: Secondary | ICD-10-CM | POA: Diagnosis not present

## 2023-07-23 DIAGNOSIS — Z89612 Acquired absence of left leg above knee: Secondary | ICD-10-CM | POA: Insufficient documentation

## 2023-07-23 DIAGNOSIS — I739 Peripheral vascular disease, unspecified: Secondary | ICD-10-CM | POA: Insufficient documentation

## 2023-07-23 MED ORDER — CEFAZOLIN SODIUM 1 G IJ SOLR
INTRAMUSCULAR | Status: AC
  Filled 2023-07-23: qty 20

## 2023-07-23 MED ORDER — LACTATED RINGERS IV SOLN: Freq: Once | INTRAVENOUS | Status: AC

## 2023-07-23 MED ORDER — CEPHALEXIN 500 MG PO CAPS: 500.0000 mg | ORAL_CAPSULE | Freq: Four times a day (QID) | ORAL | 0 refills | Status: AC

## 2023-07-23 MED ORDER — FENTANYL CITRATE (PF) 100 MCG/2ML IJ SOLN
25.0000 ug | INTRAMUSCULAR | Status: DC | PRN
Start: 2023-07-23 — End: 2023-07-23
  Administered 2023-07-23: 50 ug via INTRAVENOUS

## 2023-07-23 MED ORDER — LIDOCAINE HCL 2 % IJ SOLN
INTRAMUSCULAR | Status: AC
  Filled 2023-07-23: qty 20

## 2023-07-23 MED ORDER — FENTANYL CITRATE (PF) 100 MCG/2ML IJ SOLN
INTRAMUSCULAR | Status: AC
  Filled 2023-07-23: qty 2

## 2023-07-23 MED ORDER — LIDOCAINE HCL 2 % IJ SOLN
20.0000 mL | Freq: Once | INTRAMUSCULAR | Status: AC
  Administered 2023-07-23: 400 mg

## 2023-07-23 MED ORDER — MIDAZOLAM HCL 5 MG/5ML IJ SOLN
INTRAMUSCULAR | Status: AC
  Filled 2023-07-23: qty 5

## 2023-07-23 MED ORDER — CEFAZOLIN SODIUM-DEXTROSE 2-4 GM/100ML-% IV SOLN
2.0000 g | Freq: Once | INTRAVENOUS | Status: AC
  Administered 2023-07-23: 2 g via INTRAVENOUS
  Filled 2023-07-23: qty 100

## 2023-07-23 MED ORDER — MIDAZOLAM HCL 5 MG/5ML IJ SOLN: 0.5000 mg | Freq: Once | INTRAMUSCULAR | Status: DC

## 2023-07-23 MED ORDER — ROPIVACAINE HCL 2 MG/ML IJ SOLN
18.0000 mL | Freq: Once | INTRAMUSCULAR | Status: AC
  Administered 2023-07-23: 18 mL via PERINEURAL

## 2023-07-23 MED ORDER — ROPIVACAINE HCL 2 MG/ML IJ SOLN
INTRAMUSCULAR | Status: AC
  Filled 2023-07-23: qty 20

## 2023-07-23 NOTE — Patient Instructions (Addendum)
Today we did the following:  -We have done a Spinal Cord Stimulator Trial with Medtronic  -As long as the leads are in place, do not bathe or shower. You may sponge bathe.  -While the lead is in place, please limit the bending, lifting, or twisting because the lead can move.  -STAY OFF Eliquis during your SCS trial, ok to take ASA 81 mg starting tomorrow  -The things we want to see is if your pain improves (and by what percentage), if you can do more activity (don't overdo it), and if you can use less of your "as needed" medicine. Do not stop long acting medicines like methadone, oxycontin, MS Contin, etc without checking with [place].  -It is VERY important that you pick up the antibiotics we prescribed, Keflex, on your way home from the trial and take them as prescribed(4 times a day), starting today, for as long as the lead is in place.  -The Spina Cord Stimulator Representative will be in contact with you while the lead is in place to make sure the trial goes as well as possible.  -Please contact us with any questions or concerns at any time during the trial.   -If you start running a fever over 100 degrees, have severe back pain, or new pain running down the legs, or drainage coming from the lead site, contact us immediately and/or go to the emergency room.  -Please do not restart any sort of medication that can thin your blood such as Aspirin, ibuprofen, motrin, aleve, plavix, coumadin, etc. If you aren't sure, call and ask.  -We will have you return on 12/9 to have the lead removed. If this is successful, at that point we can go over the details about the permanent implant.

## 2023-07-23 NOTE — Progress Notes (Signed)
Safety precautions to be maintained throughout the outpatient stay will include: orient to surroundings, keep bed in low position, maintain call bell within reach at all times, provide assistance with transfer out of bed and ambulation.  

## 2023-07-23 NOTE — Progress Notes (Signed)
PROVIDER NOTE: Interpretation of information contained herein should be left to medically-trained personnel. Specific patient instructions are provided elsewhere under "Patient Instructions" section of medical record. This document was created in part using STT-dictation technology, any transcriptional errors that may result from this process are unintentional.  Patient: Sara [name] Type: Established DOB: [birth year] MRN: [id number] PCP: Orson Eva, NP  Service: Procedure DOS: 07/23/2023 Setting: Ambulatory Location: Ambulatory outpatient facility Delivery: Face-to-face Provider: Edward Jolly, MD Specialty: Interventional Pain Management Specialty designation: 09 Location: Outpatient facility Ref. Prov.: Edward Jolly, MD       Interventional Therapy   Primary Reason for Admission: Surgical management of chronic pain condition.   Procedure:              Type: MEDTRONIC Trial Spinal Cord Neurostimulator Implant (Percutaneous, interlaminar, posterior epidural placement) Laterality: Bilateral (-50)  Level: Lumbar  Imaging: Fluoroscopic guidance Anesthesia: Local anesthesia (1-2% Lidocaine) Sedation: Moderate Sedation                       DOS: 07/23/2023  Performed by: Edward Jolly, MD  Purpose: Diagnostic. To determine if a permanent implant may be effective in controlling some or all of Sara [name] chronic pain symptoms.  Rationale (medical necessity): procedure needed and proper for the diagnosis and/or treatment of Sara [name]'s medical symptoms and needs. 1. 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 Phantom limb pain (HCC)   3. Neuropathic pain   4. PVD (peripheral vascular disease) (HCC)   5. History of left above knee amputation [facility]) (Dec 2021)   6. Chronic pain syndrome    Patient stopped Eliquis 3 days prior.  She is instructed to remain off Eliquis for the duration of her SCS trial.  Okay to start aspirin 81 mg tomorrow.  NAS-11 Pain  score:   Pre-procedure: 8 /10   Post-procedure: 0-No pain/10     Target: Posterior epidural space over the dorsal columns of the spinal cord. Location: Posterior intraspinal canal Region: Thoracolumbar  Approach: Translaminar percutaneous  Type of procedure: Surgical   Position / Prep / Materials:  Position: Prone  Prep solution: ChloraPrep (2% chlorhexidine gluconate and 70% isopropyl alcohol) Prep Area: Entire  Posterior  Thoracolumbar  Region  Materials:  Tray: Implant tray Needle(s):  Type: Epidural  Gauge (G): 14  Length: Regular (10cm)  Qty: 2  H&P (Pre-op Assessment):  Sara [name] is a 72 y.o. (year old), female patient, seen today for interventional treatment. She  has a past surgical history that includes Vascular surgery (4098,1191); Hemorroidectomy (2014); Esophagogastroduodenoscopy (N/A, 12/21/2014); Esophagogastroduodenoscopy (egd) with propofol (N/A, 02/08/2016); Cardiac catheterization (N/A, 02/09/2016); Cardiac catheterization (Right, 02/10/2016); Cardiac catheterization (02/15/2017); Colonoscopy with propofol (N/A, 10/22/2017); Colonoscopy with propofol (N/A, 11/05/2017); polypectomy (11/05/2017); Tonsillectomy; Adenoidectomy; Lower Extremity Angiography (Right, 05/30/2019); Fasciotomy closure (Right, 06/04/2019); Artery Biopsy (Right, 07/14/2019); Fasciotomy (Right, 05/30/2019); Breast cyst aspiration (Left); Shoulder arthroscopy with rotator cuff repair and subacromial decompression (Right, 02/27/2020); Lower Extremity Angiography (Right, 07/02/2020); Amputation (Right, 08/11/2020); Colonoscopy with propofol (N/A, 11/13/2022); and polypectomy (11/13/2022).  Initial Vital Signs:  Pulse/EKG Rate: 74ECG Heart Rate: 74 (nsr) Temp: (!) 97.5 F (36.4 C) Resp: 16 BP: 123/73 SpO2: 97 %  BMI: Estimated body mass index is 23.03 kg/m as calculated from the following:   Height as of this encounter: 5\' 3"  (1.6 m).   Weight as of this encounter: 130 lb (59 kg).  Risk  Assessment: Allergies: Reviewed. She is allergic to hydrocodone-acetaminophen and diltiazem.  Allergy  Precautions: None required Coagulopathies: Reviewed. None identified.  Blood-thinner therapy: None at this time Active Infection(s): Reviewed. None identified. Sara [name] is afebrile  Site Confirmation: Sara [name] was asked to confirm the procedure and laterality before marking the site, which she did. Procedure checklist: Completed Consent: Before the procedure and under the influence of no sedative(s), amnesic(s), or anxiolytics, the patient was informed of the treatment options, risks and possible complications. To fulfill our ethical and legal obligations, as recommended by the American Medical Association's Code of Ethics, I have informed the patient of my clinical impression; the nature and purpose of the treatment or procedure; the risks, benefits, and possible complications of the intervention; the alternatives, including doing nothing; the risk(s) and benefit(s) of the alternative treatment(s) or procedure(s); and the risk(s) and benefit(s) of doing nothing.  Sara [name] was provided with information about the general risks and possible complications associated with most interventional procedures. These include, but are not limited to: failure to achieve desired goals, infection, bleeding, organ or nerve damage, allergic reactions, paralysis, and/or death.  In addition, she was informed of those risks and possible complications associated to this particular procedure, which include, but are not limited to: damage to the implant; failure to decrease pain; local, systemic, or serious CNS infections, intraspinal abscess with possible cord compression and paralysis, or life-threatening such as meningitis; intrathecal and/or epidural bleeding with formation of hematoma with possible spinal cord compression and permanent paralysis; organ damage; nerve injury or damage with subsequent sensory,  motor, and/or autonomic system dysfunction, resulting in transient or permanent pain, numbness, and/or weakness of one or several areas of the body; allergic reactions, either minor or major life-threatening, such as anaphylactic or anaphylactoid reactions.  Furthermore, Ms. Wolske was informed of those risks and complications associated with the medications. These include, but are not limited to: allergic reactions (i.e.: anaphylactic or anaphylactoid reactions); arrhythmia;  Hypotension/hypertension; cardiovascular collapse; respiratory depression and/or shortness of breath; swelling or edema; medication-induced neural toxicity; particulate matter embolism and blood vessel occlusion with resultant organ, and/or nervous system infarction and permanent paralysis.  Finally, she was informed that Medicine is not an exact science; therefore, there is also the possibility of unforeseen or unpredictable risks and/or possible complications that may result in a catastrophic outcome. The patient indicated having understood very clearly. We have given the patient no guarantees and we have made no promises. Enough time was given to the patient to ask questions, all of which were answered to the patient's satisfaction. Ms. Barbie has indicated that she wanted to continue with the procedure. Attestation: I, the ordering provider, attest that I have discussed with the patient the benefits, risks, side-effects, alternatives, likelihood of achieving goals, and potential problems during recovery for the procedure that I have provided informed consent. Date  Time: 07/23/2023  8:32 AM  Pre-Procedure Preparation:  Monitoring: As per clinic protocol. Respiration, ETCO2, SpO2, BP, heart rate and rhythm monitor placed and checked for adequate function Safety Precautions: Patient was assessed for positional comfort and pressure points before starting the procedure. Time-out: I initiated and conducted the "Time-out" before  starting the procedure, as per protocol. The patient was asked to participate by confirming the accuracy of the "Time Out" information. Verification of the correct person, site, and procedure were performed and confirmed by me, the nursing staff, and the patient. "Time-out" conducted as per Joint Commission's Universal Protocol (UP.01.01.01). Time: 0949 Start Time: 0949 hrs.  Description/Narrative of Procedure:  Rationale (medical necessity): procedure needed and proper for the diagnosis and/or treatment of the patient's medical symptoms and needs. Procedural Technique Safety Precautions: Aspiration looking for blood return was conducted prior to all injections. At no point did we inject any substances, as a needle was being advanced. No attempts were made at seeking any paresthesias. Safe injection practices and needle disposal techniques used. Medications properly checked for expiration dates. SDV (single dose vial) medications used. Description of the Procedure: Protocol guidelines were followed. The patient was assisted into a comfortable position. The target area was identified and the area prepped in the usual manner. Skin & deeper tissues infiltrated with local anesthetic. Appropriate amount of time allowed to pass for local anesthetics to take effect. The procedure needles were then advanced to the target area. Proper needle placement secured. Negative aspiration confirmed. Solution injected in intermittent fashion, asking for systemic symptoms every 0.5cc of injectate. The needles were then removed and the area cleansed, making sure to leave some of the prepping solution back to take advantage of its long term bactericidal properties.  Technical description of procedure: Availability of a responsible, adult driver, and NPO status confirmed. Informed consent was obtained after having discussed risks and possible complications. An IV was started. The patient was then taken to the fluoroscopy  suite, where the patient was placed in position for the procedure, over the fluoroscopy table. The patient was then monitored in the usual manner. Fluoroscopy was manipulated to obtain the best possible view of the target. Parallex error was corrected before commencing the procedure. Once a clear view of the target had been obtained, the skin and deeper tissues over the procedure site were infiltrated using lidocaine, loaded in a 10 cc luer-loc syringe with a 0.5 inch, 25-G needle. The introducer needle(s) was/were then inserted through the skin and deeper tissues. A paramidline approach was used to enter the posterior epidural space at a 30 angle, using "Loss-of-resistance Technique" with 3 ml of PF-NaCl (0.9% NSS). Correct needle placement was confirmed in the antero-posterior and lateral fluoroscopic views. The lead was gently introduced and manipulated under real-time fluoroscopy, constantly assessing for pain, discomfort, or paresthesias, until the tip rested at the desired level. Both sides were done in identical fashion. Electrode placement was tested until appropriate coverage was attained. Once the patient confirmed that the stimulation was over the desired area, the lead(s) was/were secured in place and the introducer needles removed. This was done under real-time fluoroscopy while observing the electrode tip to avoid unintended migration. The area was covered with a non-occlusive dressing and the patient transported to recovery for further programming.  Vitals:   07/23/23 1021 07/23/23 1031 07/23/23 1041 07/23/23 1051  BP: 138/79 (!) 150/76 129/65 (!) 123/54  Pulse:      Resp: 16 12 15 20   Temp:    (!) 97.5 F (36.4 C)  TempSrc:    Tympanic  SpO2: 97% 93% 91% 95%  Weight:      Height:        Start Time: 0949 hrs. End Time: 1015 hrs.  Neurostimulator Details:   Lead(s):  Brand: Medtronic         Epidural Access Level:  T12-L1 T12-L1  Lead implant:  Bilateral   No. of  Electrodes/Lead:  8 8  Laterality:  Midline Right  Top electrode location:  T9 T10  Bottom electrode location:  T11 T12  Model No.: [id number] Same  Length: 60 cm Same  Lot No.: [id number]   Imaging Guidance (  Spinal):          Type of Imaging Technique: Fluoroscopy Guidance (Spinal) Indication(s): Fluoroscopy guidance for needle placement to enhance accuracy in procedures requiring precise needle localization for targeted delivery of medication in or near specific anatomical locations not easily accessible without such real-time imaging assistance. Exposure Time: Please see nurses notes. Contrast: None used. Fluoroscopic Guidance: I was personally present during the use of fluoroscopy. "Tunnel Vision Technique" used to obtain the best possible view of the target area. Parallax error corrected before commencing the procedure. "Direction-depth-direction" technique used to introduce the needle under continuous pulsed fluoroscopy. Once target was reached, antero-posterior, oblique, and lateral fluoroscopic projection used confirm needle placement in all planes. Images permanently stored in EMR. Interpretation: No contrast injected. I personally interpreted the imaging intraoperatively. Adequate needle placement confirmed in multiple planes. Permanent images saved into the patient's record.      Antibiotic Prophylaxis:   Anti-infectives (From admission, onward)    Start     Dose/Rate Route Frequency Ordered Stop   07/23/23 0915  ceFAZolin (ANCEF) IVPB 2g/100 mL premix        2 g 200 mL/hr over 30 Minutes Intravenous  Once 07/23/23 0906 07/23/23 1017   07/23/23 0000  cephALEXin (KEFLEX) 500 MG capsule        500 mg Oral 4 times daily 07/23/23 0818 07/30/23 2359      Indication(s): Implant Prophylaxis.  Post-operative Assessment:  Post-procedure Vital Signs:  Pulse/HCG Rate: 7474 Temp: (!) 97.5 F (36.4 C) Resp: 20 BP: (!) 123/54 SpO2: 95 %  Complications: No immediate  post-treatment complications observed by team, or reported by patient.  Note: The patient tolerated the entire procedure well. A repeat set of vitals were taken after the procedure and the patient was kept under observation following institutional policy, for this type of procedure. Post-procedural neurological assessment was performed, showing return to baseline, prior to discharge. The patient was provided with post-procedure discharge instructions, including a section on how to identify potential problems. Should any problems arise concerning this procedure, the patient was given instructions to immediately contact us, at any time, without hesitation. In any case, we plan to contact the patient by telephone for a follow-up status report regarding this interventional procedure.  Comments:  No additional relevant information.  Plan of Care  Orders:  Orders Placed This Encounter  Procedures   DG PAIN CLINIC C-ARM 1-60 MIN NO REPORT    Intraoperative interpretation by procedural physician at [facility].    Standing Status:   Standing    Number of Occurrences:   1    Order Specific Question:   Reason for exam:    Answer:   Assistance in needle guidance and placement for procedures requiring needle placement in or near specific anatomical locations not easily accessible without such assistance.   Patient stopped Eliquis 3 days prior.  She is instructed to remain off Eliquis for the duration of her SCS trial.  Okay to start aspirin 81 mg tomorrow.  Medications administered: We administered lidocaine, lactated ringers, fentaNYL, ropivacaine (PF) 2 mg/mL (0.2%), and ceFAZolin.  See the medical record for exact dosing, route, and time of administration.  Follow-up plan:   Return in about 1 week (around 07/30/2023) for SCS lead pull.       Recent Visits Date Type Provider Dept  05/22/23 Office Visit Edward Jolly, MD [facility]  Showing recent visits within past 90 days  and meeting all other requirements Today's Visits Date Type  Provider Dept  07/23/23 Procedure visit Edward Jolly, MD [facility]  Showing today's visits and meeting all other requirements Future Appointments Date Type Provider Dept  07/30/23 Appointment Edward Jolly, MD [facility]  Showing future appointments within next 90 days and meeting all other requirements  Disposition: Discharge home  Discharge (Date  Time): 07/23/2023;   hrs.   Primary Care Physician: Orson Eva, NP Location: [facility] Note by: Edward Jolly, MD (TTS technology used. I apologize for any typographical errors that were not detected and corrected.) Date: 07/23/2023; Time: 11:52 AM

## 2023-07-24 ENCOUNTER — Telehealth: Payer: Self-pay

## 2023-07-24 NOTE — Telephone Encounter (Signed)
Post procedure follow up.  LM 

## 2023-07-30 ENCOUNTER — Ambulatory Visit
Admission: RE | Admit: 2023-07-30 | Discharge: 2023-07-30 | Disposition: A | Discharge status: Home / Self Care | Payer: Medicare PPO | Source: Ambulatory Visit | Attending: Student in an Organized Health Care Education/Training Program | Admitting: Student in an Organized Health Care Education/Training Program

## 2023-07-30 ENCOUNTER — Encounter: Payer: Self-pay | Admitting: Student in an Organized Health Care Education/Training Program

## 2023-07-30 ENCOUNTER — Ambulatory Visit
Payer: Medicare PPO | Attending: Student in an Organized Health Care Education/Training Program | Admitting: Student in an Organized Health Care Education/Training Program

## 2023-07-30 VITALS — BP 125/67 | HR 73 | Temp 98.1°F | Ht 63.0 in | Wt 130.0 lb

## 2023-07-30 DIAGNOSIS — M792 Neuralgia and neuritis, unspecified: Secondary | ICD-10-CM

## 2023-07-30 DIAGNOSIS — G546 Phantom limb syndrome with pain: Secondary | ICD-10-CM

## 2023-07-30 NOTE — Progress Notes (Signed)
Safety precautions to be maintained throughout the outpatient stay will include: orient to surroundings, keep bed in low position, maintain call bell within reach at all times, provide assistance with transfer out of bed and ambulation.  

## 2023-07-30 NOTE — Patient Instructions (Signed)

## 2023-07-30 NOTE — Progress Notes (Signed)
Patient: Sara [name]  Service Category: Procedure  Provider: Edward Jolly, MD  DOB: Dec 16, 1950  DOS: 07/30/2023  Referring Provider: Orson Eva, NP  MRN: [id number]  Setting: Ambulatory outpatient  PCP: Orson Eva, NP  Type: Established Patient  Specialty: Interventional Pain Management    Location: Office  Delivery: Face-to-face  SCS TRIAL POST-OP EVALUATION   Primary Reason(s) for Visit: Encounter for removal of temporary spinal cord stimulator lead(s) and evaluation of trial implant. CC: Foot Pain (right)  HPI  Sara [name] is a 72 y.o. year old, female patient, who comes today for a post-procedure evaluation. 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; Ischemia of lower extremity; Arterial occlusion (HCC); Atherosclerosis of aorta (HCC); History of smoking; Hyperlipidemia; Pain in the chest; Gastritis and gastroduodenitis; Gastritis; Other specified disease of esophagus; Hiatal hernia; PVD (peripheral vascular disease) (HCC); Family history of premature CAD; Compression fracture of L3 vertebra (HCC); CAD (coronary artery disease); Family history of colonic polyps; Benign neoplasm of ascending colon; History of colonic polyps; Chronic venous insufficiency; Eczema of lower extremity; Dyspnea on exertion; Non-rheumatic mitral regurgitation; Occlusion and stenosis of unspecified carotid artery; Depression; Precordial pain; PAD (peripheral artery disease) (HCC); Compression fracture of lumbar spine, non-traumatic (HCC);  Atherosclerosis of native arteries of extremity with intermittent claudication (HCC); Ischemic leg; Postoperative wound infection; Temporal arteritis (HCC); Encounter for long-term (current) use of high-risk medication; Chronic pain in right shoulder; Mitral valve insufficiency; GCA (giant cell arteritis) (HCC); Ischemia of extremity; S/P AKA (above knee amputation), right (HCC); Muscle spasm; Phantom limb pain (HCC); History of left above knee amputation [facility]) (Dec 2021); Rotator cuff arthropathy of right shoulder; Hx of rotator cuff surgery; Chronic pain syndrome; Degeneration of lumbar intervertebral disc; Shoulder pain; Pain in joint of right shoulder; Spasm of back muscles; Thoracic back pain; Adhesive capsulitis of right shoulder; IgA monoclonal gammopathy; Thoracic facet joint syndrome; Bone lesion; Sinus tachycardia; Inappropriate sinus tachycardia (HCC); Polyp of descending colon; Elevated liver enzymes; Prediabetes; Papular rash; and Neuropathic pain on their problem list. Her primarily concern today is the Foot Pain (right)  Pain Assessment: Location: Right Foot Radiating: phantom pain Onset: More than a month ago Duration: Chronic pain Quality: Aching Severity: 1 /10 (subjective, self-reported pain score)  Effect on ADL: limits ADLS Timing: Constant Modifying factors: SCS BP: 125/67  HR: 73  Sara [name] comes in today, after a SCS (Optometrist) Trial Implant on 07/24/2023, to have her percutaneous, temporary neurostimulator lead(s) removed and to evaluate the trial experience to determine if a permanent implant may be effective in controlling some or all of her chronic pain symptoms.   Post-procedure evaluation   Type: [employer] Trial Spinal Cord Neurostimulator Implant (Percutaneous, interlaminar, posterior epidural placement) Laterality: Bilateral (-50)  Level: Lumbar  Imaging: Fluoroscopic guidance Anesthesia: Local anesthesia (1-2% Lidocaine) Sedation: Moderate  Sedation                       DOS: 07/23/2023  Performed by: Edward Jolly, MD  Purpose: Diagnostic. To determine if a permanent implant may be effective in controlling some or all of Sara [name] chronic pain symptoms.  Rationale (medical necessity): procedure needed and proper  for the diagnosis and/or treatment of Sara [name]'s medical symptoms and needs. 1. 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 Phantom limb pain (HCC)   3. Neuropathic pain   4. PVD (peripheral vascular disease) (HCC)   5. History of left above knee amputation [facility]) (Dec 2021)   6. Chronic pain syndrome    Patient stopped Eliquis 3 days prior.  She is instructed to remain off Eliquis for the duration of her SCS trial.  Okay to start aspirin 81 mg tomorrow.  NAS-11 Pain score:   Pre-procedure: 8 /10   Post-procedure: 0-No pain/10      Effectiveness:  100 %    Patient notes 100% pain relief in regards to her right phantom pain.  She endorses improvement in activities of daily living.  Given analgesic and functional benefit during SCS trial, I will place referral to neurosurgery with Dr. Katrinka Blazing to discuss permanent implant.  Further details on both, my assessment(s), as well as the proposed treatment plan, please see below.  Post-operative Assessment  Intra-procedural problems/complications: None observed.         Reported side-effects: None.        Post-surgical adverse reactions or complications: None reported         Laboratory Chemistry Profile   Renal Lab Results  Component Value Date   BUN 11 01/30/2023   CREATININE 0.81 01/30/2023   BCR 14 01/30/2023   GFRAA >60 02/18/2020   GFRNONAA >60 10/09/2022   SPECGRAV 1.010 05/26/2015   PHUR 6.5 05/26/2015   PROTEINUR NEGATIVE 10/09/2022     Electrolytes Lab Results  Component Value Date   NA 141 01/30/2023   K 4.8 01/30/2023   CL 107 (H) 01/30/2023   CALCIUM 9.3 01/30/2023   MG 1.9 07/03/2020   PHOS 3.6 06/02/2019      Hepatic Lab Results  Component Value Date   AST 56 (H) 01/30/2023   ALT 64 (H) 01/30/2023   ALBUMIN 4.4 01/30/2023   ALKPHOS 74 01/30/2023   LIPASE 48 10/09/2022     ID Lab Results  Component Value Date   HIV Non Reactive 07/03/2020   SARSCOV2NAA NEGATIVE 08/09/2020   MRSAPCR NEGATIVE 02/09/2016     Bone Lab Results  Component Value Date   VD25OH 45.8 09/21/2015     Endocrine Lab Results  Component Value Date   GLUCOSE 83 01/30/2023   GLUCOSEU NEGATIVE 10/09/2022   HGBA1C 6.3 (H) 05/15/2023   TSH 2.110 05/15/2023     Neuropathy Lab Results  Component Value Date   HGBA1C 6.3 (H) 05/15/2023   HIV Non Reactive 07/03/2020     CNS No results found for: "COLORCSF", "APPEARCSF", "RBCCOUNTCSF", "WBCCSF", "POLYSCSF", "LYMPHSCSF", "EOSCSF", "PROTEINCSF", "GLUCCSF", "JCVIRUS", "CSFOLI", "IGGCSF", "LABACHR", "ACETBL"   Inflammation (CRP: Acute  ESR: Chronic) Lab Results  Component Value Date   ESRSEDRATE 58 (H) 07/03/2019   LATICACIDVEN 0.8 06/01/2019     Rheumatology No results found for: "RF", "ANA", "LABURIC", "URICUR", "LYMEIGGIGMAB", "LYMEABIGMQN", "HLAB27"   Coagulation Lab Results  Component Value Date   INR 1.0 08/09/2020   LABPROT 12.9 08/09/2020   APTT 27 08/09/2020   PLT 197 10/09/2022     Cardiovascular Lab Results  Component Value Date   TROPONINI <0.03 02/07/2016   HGB 14.8 10/09/2022   HCT 45.6 10/09/2022     Screening Lab Results  Component Value Date   SARSCOV2NAA NEGATIVE 08/09/2020   MRSAPCR NEGATIVE 02/09/2016   HIV Non Reactive 07/03/2020  Cancer No results found for: "CEA", "CA125", "LABCA2"   Allergens No results found for: "ALMOND", "APPLE", "ASPARAGUS", "AVOCADO", "BANANA", "BARLEY", "BASIL", "BAYLEAF", "GREENBEAN", "LIMABEAN", "WHITEBEAN", "BEEFIGE", "REDBEET", "BLUEBERRY", "BROCCOLI", "CABBAGE", "MELON", "CARROT", "CASEIN", "CASHEWNUT", "CAULIFLOWER", "CELERY"     Note: Lab results reviewed.   Meds   Current  Outpatient Medications:    apixaban (ELIQUIS) 2.5 MG TABS tablet, Take 2.5 mg by mouth 2 (two) times daily., Disp: , Rfl:    atorvastatin (LIPITOR) 40 MG tablet, Take 40 mg by mouth at bedtime. , Disp: , Rfl:    Blood Pressure Monitoring (BLOOD PRESSURE CUFF) MISC, 1 Units by Does not apply route daily as needed., Disp: 1 each, Rfl: 0   Calcium Carb-Cholecalciferol 600-10 MG-MCG TABS, Take 1 tablet by mouth 2 (two) times daily., Disp: , Rfl:    cephALEXin (KEFLEX) 500 MG capsule, Take 1 capsule (500 mg total) by mouth 4 (four) times daily for 7 days., Disp: 28 capsule, Rfl: 0   digoxin (LANOXIN) 0.125 MG tablet, Take 125 mcg by mouth daily., Disp: , Rfl:    DULoxetine (CYMBALTA) 60 MG capsule, TAKE 1 CAPSULE BY MOUTH TWICE A DAY, Disp: 180 capsule, Rfl: 3   gabapentin (NEURONTIN) 800 MG tablet, Take 1 tablet (800 mg total) by mouth 3 (three) times daily., Disp: 90 tablet, Rfl: 0   ivabradine (CORLANOR) 7.5 MG TABS tablet, Take 1 tablet (7.5 mg total) by mouth 2 (two) times daily with a meal., Disp: 60 tablet, Rfl: 3   lidocaine (LIDODERM) 5 %, Place 1 patch onto the skin daily., Disp: , Rfl:    losartan (COZAAR) 25 MG tablet, Take 25 mg by mouth every morning. , Disp: , Rfl:    omeprazole (PRILOSEC) 40 MG capsule, Take 40 mg by mouth daily as needed., Disp: , Rfl:    Tocilizumab (ACTEMRA ACTPEN) 162 MG/0.9ML SOAJ, Inject into the skin., Disp: , Rfl:    triamcinolone ointment (KENALOG) 0.5 %, Apply 1 Application topically 2 (two) times daily., Disp: 30 g, Rfl: 3   zolpidem (AMBIEN) 10 MG tablet, TAKE 1 TABLET BY MOUTH NIGHTLY AT BEDTIME AS NEEDED FOR INSOMNIA, Disp: 30 tablet, Rfl: 1  ROS  Constitutional: Denies any fever or chills Gastrointestinal: No reported hemesis, hematochezia, vomiting, or acute GI distress Musculoskeletal: Denies any acute onset joint swelling, redness, loss of ROM, or weakness Neurological: No reported episodes of acute onset apraxia, aphasia, dysarthria, agnosia,  amnesia, paralysis, loss of coordination, or loss of consciousness  Allergies  Sara [name] is allergic to hydrocodone-acetaminophen and diltiazem.  PFSH  Drug: Sara [name]  reports no history of drug use. Alcohol:  reports no history of alcohol use. Tobacco:  reports that she quit smoking about 31 years ago. Her smoking use included cigarettes. She started smoking about 56 years ago. She has a 50 pack-year smoking history. She has never used smokeless tobacco. 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 Surgical: Sara [name]  has a past surgical history that includes Vascular surgery [phone]); Hemorroidectomy (2014); Esophagogastroduodenoscopy (N/A, 12/21/2014); Esophagogastroduodenoscopy (egd) with propofol (N/A, 02/08/2016); Cardiac catheterization (N/A, 02/09/2016); Cardiac catheterization (Right, 02/10/2016); Cardiac catheterization (02/15/2017); Colonoscopy with propofol (N/A, 10/22/2017); Colonoscopy with propofol (N/A, 11/05/2017); polypectomy (11/05/2017); Tonsillectomy; Adenoidectomy; Lower Extremity Angiography (Right, 05/30/2019); Fasciotomy closure (Right, 06/04/2019); Artery Biopsy (Right,  07/14/2019); Fasciotomy (Right, 05/30/2019); Breast cyst aspiration (Left); Shoulder arthroscopy with rotator cuff repair and subacromial decompression (Right, 02/27/2020); Lower Extremity Angiography (Right, 07/02/2020); Amputation (Right, 08/11/2020); Colonoscopy with propofol (N/A, 11/13/2022); and polypectomy (11/13/2022). Family: family history includes CVA in her mother; Cancer in her father; Colon cancer in her father; Heart attack in her mother; Heart disease in her  maternal uncle.  Postop Exam  General appearance: Afebrile. Well nourished, well developed, and well hydrated. In no apparent acute distress. Vitals:   07/30/23 1124  BP: 125/67  Pulse: 73  Temp: 98.1 F (36.7 C)  SpO2: 94%  Weight: 130 lb (59 kg)  Height: 5\' 3"  (1.6 m)   BMI Assessment: Estimated body mass index is 23.03 kg/m as calculated from the following:   Height as of this encounter: 5\' 3"  (1.6 m).   Weight as of this encounter: 130 lb (59 kg).  Surgical site: Wound is healing well. No redness, tenderness, discharge, abnormal odors, or any other evidence of infection or complications. 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 Diagnoses of Phantom limb pain (HCC) and Neuropathic pain were also pertinent to this visit.  Diagnosis Status  1. 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 Phantom limb pain (HCC)   3. Neuropathic pain    Controlled Controlled Controlled   Plan of Care  Status post successful spinal cord stimulator trial for phantom limb pain.  Referral to Dr. Katrinka Blazing with neurosurgery to discuss permanent [employer] spinal cord stimulator implant.   Orders:  Orders Placed This Encounter  Procedures   DG PAIN CLINIC C-ARM 1-60 MIN NO REPORT    Intraoperative interpretation by procedural physician at [facility].    Standing Status:   Standing    Number of Occurrences:   1    Order Specific Question:   Reason for exam:    Answer:   Assistance in needle guidance and placement for procedures requiring needle placement in or near specific anatomical locations not easily accessible without such assistance.   Ambulatory referral to Neurosurgery    Referral Priority:   Routine    Referral Type:   Surgical    Referral Reason:   Specialty Services Required    Referred  to Provider:   Lovenia Kim, MD    Requested Specialty:   Neurosurgery    Number of Visits Requested:   1    Medications administered: Charlesetta Ivory had no medications administered during this visit.  See the medical record for exact dosing, route, and time of administration.  Follow-up plan:   Return if symptoms worsen or fail to improve.      Recent Visits Date Type Provider Dept  07/23/23 Procedure visit Edward Jolly, MD [facility]  05/22/23 Office Visit Edward Jolly, MD [facility]  Showing recent visits within past 90 days and meeting all other requirements Today's Visits Date Type Provider Dept  07/30/23 Procedure visit Edward Jolly, MD [facility]  Showing today's visits and meeting all other requirements Future Appointments No visits were found meeting these conditions. Showing future appointments within next 90 days and meeting all other requirements  Disposition: Discharge home  Discharge (Date  Time): 07/30/2023; 1158 hrs.   Primary Care Physician: Orson Eva, NP Location: [facility] Note by: Edward Jolly, MD (TTS technology  used. I apologize for any typographical errors that were not detected and corrected.) Date: 07/30/2023; Time: 12:21 PM

## 2023-07-30 NOTE — Progress Notes (Unsigned)
Referring Physician:  Edward Jolly, MD [address] [place],  [place] [address]  Primary Physician:  Orson Eva, NP  History of Present Illness: 08/01/2023 [name] is here today with a chief complaint of right lower extremity phantom limb pain.  She has had a previous Medtronic spinal cord stimulator trial on 07/24/2023 through 07/30/2023 and had 99 to 100% relief of her phantom limb pain.  She is very pleased with her results and is here today to discuss going forward with a permanent paddle placement.  She has had her previous pain evaluation as well as her psychotherapy evaluation.  She is ready to go forward with final placement.  The symptoms are causing a significant impact on the patient's life.   I have utilized the care everywhere function in epic to review the outside records available from external health systems.  Review of Systems:  A 10 point review of systems is negative, except for the pertinent positives and negatives detailed in the HPI.  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 Gilda Crease   Vertigo    Last episode approx Aug 2015   Wears dentures    full upper    Past Surgical History: Past Surgical History:  Procedure Laterality Date   ADENOIDECTOMY     AMPUTATION Right 08/11/2020   Procedure: AMPUTATION ABOVE KNEE;  Surgeon: Renford Dills, MD;  Location: [facility];  Service: Vascular;  Laterality: Right;   ARTERY BIOPSY Right 07/14/2019   Procedure: BIOPSY TEMPORAL ARTERY;   Surgeon: Renford Dills, MD;  Location: [facility];  Service: Vascular;  Laterality: Right;   BREAST CYST ASPIRATION Left    CARDIAC CATHETERIZATION  02/15/2017   [facility]   COLONOSCOPY WITH PROPOFOL N/A 10/22/2017   Procedure: COLONOSCOPY WITH PROPOFOL;  Surgeon: Midge Minium, MD;  Location: [facility];  Service: Endoscopy;  Laterality: N/A;  specimens not taken--pt on Plavix will be brought back in after 7 days off med   COLONOSCOPY WITH PROPOFOL N/A 11/05/2017   Procedure: COLONOSCOPY WITH PROPOFOL;  Surgeon: Midge Minium, MD;  Location: [facility];  Service: Endoscopy;  Laterality: N/A;   COLONOSCOPY WITH PROPOFOL N/A 11/13/2022   Procedure: COLONOSCOPY WITH PROPOFOL;  Surgeon: Midge Minium, MD;  Location: [facility];  Service: Endoscopy;  Laterality: N/A;   ESOPHAGOGASTRODUODENOSCOPY N/A 12/21/2014   Procedure: ESOPHAGOGASTRODUODENOSCOPY (EGD);  Surgeon: Midge Minium, MD;  Location: [facility];  Service: Gastroenterology;  Laterality: N/A;   ESOPHAGOGASTRODUODENOSCOPY (EGD) WITH PROPOFOL N/A 02/08/2016   Procedure: ESOPHAGOGASTRODUODENOSCOPY (EGD) WITH PROPOFOL;  Surgeon: Midge Minium, MD;  Location: [facility];  Service: Endoscopy;  Laterality: N/A;   FASCIOTOMY Right 05/30/2019   Procedure: FASCIOTOMY;  Surgeon: Renford Dills, MD;  Location: [facility];  Service: Vascular;  Laterality: Right;   FASCIOTOMY CLOSURE Right 06/04/2019   Procedure: FASCIOTOMY CLOSURE;  Surgeon: Renford Dills, MD;  Location: [facility];  Service: Vascular;  Laterality: Right;   HEMORROIDECTOMY  2014   LOWER EXTREMITY ANGIOGRAPHY Right 05/30/2019   Procedure: LOWER EXTREMITY ANGIOGRAPHY;  Surgeon: Renford Dills, MD;  Location: [facility];  Service: Cardiovascular;  Laterality: Right;   LOWER EXTREMITY ANGIOGRAPHY Right 07/02/2020   Procedure: LOWER EXTREMITY ANGIOGRAPHY;  Surgeon: Renford Dills, MD;  Location: [facility];  Service: Cardiovascular;   Laterality: Right;   PERIPHERAL VASCULAR CATHETERIZATION N/A 02/09/2016   Procedure: Abdominal Aortogram w/Lower Extremity;  Surgeon: Renford Dills, MD;  Location: [facility];  Service: Cardiovascular;  Laterality: N/A;   PERIPHERAL VASCULAR CATHETERIZATION Right 02/10/2016   Procedure: Lower Extremity Angiography;  Surgeon: Renford Dills, MD;  Location: [facility];  Service: Cardiovascular;  Laterality: Right;   POLYPECTOMY  11/05/2017   Procedure: POLYPECTOMY;  Surgeon: Midge Minium, MD;  Location: [facility];  Service: Endoscopy;;   POLYPECTOMY  11/13/2022   Procedure: POLYPECTOMY;  Surgeon: Midge Minium, MD;  Location: [facility];  Service: Endoscopy;;   SHOULDER ARTHROSCOPY WITH ROTATOR CUFF REPAIR AND SUBACROMIAL DECOMPRESSION Right 02/27/2020   Procedure: RIGHT SHOULDER ARTHROSCOPY SUBACROMIAL DECOMPRESSION, DISTAL CLAVICLE EXCISION AND MINI-OPEN ROTATOR CUFF REPAIR;  Surgeon: Juanell Fairly, MD;  Location: [facility];  Service: Orthopedics;  Laterality: Right;   TONSILLECTOMY     VASCULAR SURGERY  2956,2130   Fem-Pop Bypass    Allergies: Allergies as of 08/01/2023 - Review Complete 08/01/2023  Allergen Reaction Noted   Hydrocodone-acetaminophen  05/29/2019   Diltiazem Other (See Comments) 10/04/2022    Medications:  Current Outpatient Medications:    apixaban (ELIQUIS) 2.5 MG TABS tablet, Take 2.5 mg by mouth 2 (two) times daily., Disp: , Rfl:    atorvastatin (LIPITOR) 40 MG tablet, Take 40 mg by mouth at bedtime. , Disp: , Rfl:    Blood Pressure Monitoring (BLOOD PRESSURE CUFF) MISC, 1 Units by Does not apply route daily as needed., Disp: 1 each, Rfl: 0   Calcium Carb-Cholecalciferol 600-10 MG-MCG TABS, Take 1 tablet by mouth 2 (two) times daily., Disp: , Rfl:    digoxin (LANOXIN) 0.125 MG tablet, Take 125 mcg by mouth daily., Disp: , Rfl:    DULoxetine (CYMBALTA) 60 MG capsule, TAKE 1 CAPSULE BY MOUTH TWICE A DAY, Disp: 180 capsule,  Rfl: 3   gabapentin (NEURONTIN) 800 MG tablet, Take 1 tablet (800 mg total) by mouth 3 (three) times daily., Disp: 90 tablet, Rfl: 0   ivabradine (CORLANOR) 7.5 MG TABS tablet, Take 1 tablet (7.5 mg total) by mouth 2 (two) times daily with a meal., Disp: 60 tablet, Rfl: 3   lidocaine (LIDODERM) 5 %, Place 1 patch onto the skin daily., Disp: , Rfl:    losartan (COZAAR) 25 MG tablet, Take 25 mg by mouth every morning. , Disp: , Rfl:    omeprazole (PRILOSEC) 40 MG capsule, Take 40 mg by mouth daily as needed., Disp: , Rfl:    triamcinolone ointment (KENALOG) 0.5 %, Apply 1 Application topically 2 (two) times daily., Disp: 30 g, Rfl: 3   zolpidem (AMBIEN) 10 MG tablet, TAKE 1 TABLET BY MOUTH NIGHTLY AT BEDTIME AS NEEDED FOR INSOMNIA, Disp: 30 tablet, Rfl: 1  Social History: Social History   Tobacco Use   Smoking status: Former    Current packs/day: 0.00    Average packs/day: 2.0 packs/day for 25.0 years (50.0 ttl pk-yrs)    Types: Cigarettes    Start date: 08/21/1966    Quit date: 08/22/1991  Years since quitting: 31.9   Smokeless tobacco: Never  Vaping Use   Vaping status: Never Used  Substance Use Topics   Alcohol use: No    Alcohol/week: 0.0 standard drinks of alcohol   Drug use: Never    Family Medical History: Family History  Problem Relation Age of Onset   CVA Mother    Heart attack Mother    Cancer Father        colon cancer   Colon cancer Father    Heart disease Maternal Uncle    Breast cancer Neg Hx     Physical Examination: Vitals:   08/01/23 1247  BP: 130/68    General: Patient is in no apparent distress. Attention to examination is appropriate.  Neck:   Supple.  Full range of motion.  Respiratory: Patient is breathing without any difficulty.   NEUROLOGICAL:     Awake, alert, oriented to person, place, and time.  Speech is clear and fluent.   Cranial Nerves: Pupils equal round and reactive to light.  Facial tone is symmetric.  Facial sensation is  symmetric. Shoulder shrug is symmetric. Tongue protrusion is midline.    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  Reflexes are patellar reflex is 2+ on the left.  Bilateral upper and lower extremity sensation is intact to light touch       Imaging: Narrative & Impression  CLINICAL DATA:  Follow-up PET scan and abnormal outside thoracic spine MRI. Suspicion for multiple myeloma.   EXAM: MRI LUMBAR SPINE WITHOUT AND WITH CONTRAST   TECHNIQUE: Multiplanar and multiecho pulse sequences of the lumbar spine were obtained without and with intravenous contrast.   CONTRAST:  5mL GADAVIST GADOBUTROL 1 MMOL/ML IV SOLN   COMPARISON:  PET-CT 05/16/2022 and prior lumbar spine radiographs 02/23/2022   FINDINGS: Segmentation: There are five lumbar type vertebral bodies. The last full intervertebral disc space is labeled L5-S1.   Alignment:  Normal   Vertebrae: There are numerous small lesions scattered throughout the lower thoracic and lumbar vertebral bodies and also in the sacrum. Most of these demonstrate increased STIR signal intensity and low T1 signal intensity and show contrast enhancement. Findings highly suspicious for multiple myeloma.   No acute fractures are identified. Remote compression fractures of L1 and L3.   Conus medullaris and cauda equina: Conus extends to the L1 level. Conus and cauda equina appear normal.   Paraspinal and other soft tissues: No significant paraspinal or retroperitoneal findings. No aortic aneurysm or retroperitoneal adenopathy.   Disc levels:   T12-L1: No significant findings.   L1-2: Mild annular bulge but the spinal canal is generous. No spinal or foraminal stenosis.   L2-3: Bulging degenerated disc, mild retropulsion of the posterosuperior aspect of L3 and facet disease contributing to mild spinal and bilateral lateral recess  stenosis. No foraminal stenosis.   L3-4: No focal disc protrusions, spinal or foraminal stenosis. Minimal right lateral recess encroachment.   L4-5: Moderate facet disease and ligamentum flavum thickening but no disc protrusions, spinal or foraminal stenosis.   L5-S1: Mild facet disease. No spinal or foraminal stenosis.   IMPRESSION: 1. Numerous small enhancing lesions scattered throughout the lower thoracic and lumbar vertebral bodies and also in the sacrum highly suspicious for multiple myeloma. No acute fractures are identified. 2. Remote compression fractures of L1 and L3. 3. Mild spinal and bilateral lateral recess stenosis at L2-3. 4. Minimal right  lateral recess encroachment at L3-4.     Electronically Signed   By: Rudie Meyer M.D.   On: 06/25/2022 12:07     Review of the MRI of her thoracic spine demonstrates adequate place/space for a paddle   I have personally reviewed the images and agree with the above interpretation.  Medical Decision Making/Assessment and Plan: Ms. Masso is a pleasant 72 y.o. 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  She has had significant stump and nerve pain ever since that time.  She has been following with our pain team and has had a previous spinal cord stimulator trial with percutaneous leads which resulted in 95 to 100% relief of her phantom limb pain.  Given this positive result as well as her going through her psychotherapy and further evaluation, she is here today to discuss permanent paddle placement of a spinal cord stimulator.  We discussed the risks and benefits including spinal cord injury, failure to improve her symptoms, and need for further surgeries among other complications.  She would like to go forward with the final placement, we will plan on coordinating an operative date for her.  All of her questions were and answered  Thank you for involving me in the care of  this patient.    Lovenia Kim MD/MSCR Neurosurgery

## 2023-08-01 ENCOUNTER — Encounter: Payer: Self-pay | Admitting: Neurosurgery

## 2023-08-01 ENCOUNTER — Other Ambulatory Visit: Payer: Self-pay

## 2023-08-01 ENCOUNTER — Ambulatory Visit: Payer: Medicare PPO | Admitting: Neurosurgery

## 2023-08-01 VITALS — BP 130/68 | Ht 63.0 in | Wt 145.0 lb

## 2023-08-01 DIAGNOSIS — G894 Chronic pain syndrome: Secondary | ICD-10-CM

## 2023-08-01 DIAGNOSIS — G546 Phantom limb syndrome with pain: Secondary | ICD-10-CM | POA: Diagnosis not present

## 2023-08-01 DIAGNOSIS — Z01818 Encounter for other preprocedural examination: Secondary | ICD-10-CM

## 2023-08-01 NOTE — Patient Instructions (Addendum)
Please see below for information in regards to your upcoming surgery:   Planned surgery: thoracic laminectomy for spinal cord stimulator placement ([employer]tr[employer])   Surgery date: 08/28/23 at [facility] (Medical Mall: [address], [place], [place] [address]) - you will find out your arrival time the business day before your surgery.   Pre-op appointment at [facility] Pre-admit Testing: we will call you with a date/time for this. If you are scheduled for an in person appointment, Pre-admit Testing is located on the first floor of the [facility] building, [address] [facility], [address]. Please bring all prescriptions in the original prescription bottles to your appointment. During this appointment, they will advise you which medications you can take the morning of surgery, and which medications you will need to hold for surgery. Labs (such as blood work, EKG) may be done at your pre-op appointment. You are not required to fast for these labs. Should you need to change your pre-op appointment, please call Pre-admit testing at [phone].     Blood thinners:      Eliquis:   stop Eliquis 3 days prior, resume Eliquis 14 days after     Rheumatology medications: do not start Remicade until 6 week appointment     Surgical clearance: we will send a clearance form to Dr Gilda Crease. They may wish to see you in their office prior to signing the clearance form. If so, they may call you to schedule an appointment.      Common restrictions after surgery: No bending, lifting, or twisting ("BLT"). Avoid lifting objects heavier than 10 pounds for the first 6 weeks after surgery. Where possible, avoid household activities that involve lifting, bending, reaching, pushing, or pulling such as laundry, vacuuming, grocery shopping, and childcare. Try to arrange for help from friends and family for these activities while you heal. Do not drive while taking prescription pain  medication. Weeks 6 through 12 after surgery: avoid lifting more than 25 pounds.     How to contact us:  If you have any questions/concerns before or after surgery, you can reach [place] at [phone], or you can send a mychart message. We can be reached by phone or mychart 8am-4pm, Monday-Friday.  *Please note: Calls after 4pm are forwarded to a third party answering service. Mychart messages are not routinely monitored during evenings, weekends, and holidays. Please call our office to contact the answering service for urgent concerns during non-business hours.     Appointments/FMLA & disability paperwork: Joycelyn Rua, & Flonnie Hailstone Registered Nurse/Surgery scheduler: Royston Cowper Medical Assistants: Nash Mantis Physician Assistants: Joan Flores, PA-C, Manning Charity, PA-C & Drake Leach, PA-C Surgeons: Venetia Night, MD & Ernestine Mcmurray, MD

## 2023-08-02 ENCOUNTER — Other Ambulatory Visit: Payer: Self-pay

## 2023-08-02 ENCOUNTER — Inpatient Hospital Stay
Admission: RE | Admit: 2023-08-02 | Discharge: 2023-08-02 | Disposition: A | Discharge status: Home / Self Care | Payer: Self-pay | Source: Ambulatory Visit | Attending: Neurosurgery | Admitting: Neurosurgery

## 2023-08-02 DIAGNOSIS — Z049 Encounter for examination and observation for unspecified reason: Secondary | ICD-10-CM

## 2023-08-03 ENCOUNTER — Ambulatory Visit: Payer: Medicare PPO | Admitting: Cardiovascular Disease

## 2023-08-03 ENCOUNTER — Encounter: Payer: Self-pay | Admitting: Cardiovascular Disease

## 2023-08-03 VITALS — BP 170/78 | HR 81 | Ht 63.0 in | Wt 137.0 lb

## 2023-08-03 DIAGNOSIS — I10 Essential (primary) hypertension: Secondary | ICD-10-CM | POA: Diagnosis not present

## 2023-08-03 DIAGNOSIS — I998 Other disorder of circulatory system: Secondary | ICD-10-CM

## 2023-08-03 DIAGNOSIS — R000 Tachycardia, unspecified: Secondary | ICD-10-CM | POA: Diagnosis not present

## 2023-08-03 DIAGNOSIS — M316 Other giant cell arteritis: Secondary | ICD-10-CM

## 2023-08-03 DIAGNOSIS — E0849 Diabetes mellitus due to underlying condition with other diabetic neurological complication: Secondary | ICD-10-CM | POA: Diagnosis not present

## 2023-08-03 DIAGNOSIS — Z89611 Acquired absence of right leg above knee: Secondary | ICD-10-CM | POA: Diagnosis not present

## 2023-08-03 DIAGNOSIS — E782 Mixed hyperlipidemia: Secondary | ICD-10-CM | POA: Diagnosis not present

## 2023-08-03 DIAGNOSIS — I4711 Inappropriate sinus tachycardia, so stated: Secondary | ICD-10-CM

## 2023-08-03 DIAGNOSIS — I340 Nonrheumatic mitral (valve) insufficiency: Secondary | ICD-10-CM

## 2023-08-03 DIAGNOSIS — I70221 Atherosclerosis of native arteries of extremities with rest pain, right leg: Secondary | ICD-10-CM

## 2023-08-03 DIAGNOSIS — I749 Embolism and thrombosis of unspecified artery: Secondary | ICD-10-CM | POA: Diagnosis not present

## 2023-08-03 NOTE — Progress Notes (Signed)
Cardiology Office Note   Date:  08/03/2023   ID:  Sara, [name] September 07, 1950, MRN [id number]  PCP:  Orson Eva, NP  Cardiologist:  Adrian Blackwater, MD      History of Present Illness: Sara [name] is a 72 y.o. female who presents for  Chief Complaint  Patient presents with   Follow-up    2 month follow up    Patient fell in parking lot as has amputation, [facility]. Unable to take metoprolol as has severe PVD, needing amputation.      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 Gilda Crease   Vertigo    Last episode approx Aug 2015   Wears dentures    full upper     Past Surgical History:  Procedure Laterality Date   ADENOIDECTOMY     AMPUTATION Right 08/11/2020   Procedure: AMPUTATION ABOVE KNEE;  Surgeon: Renford Dills, MD;  Location: [facility];  Service: Vascular;  Laterality: Right;   ARTERY BIOPSY Right 07/14/2019   Procedure: BIOPSY TEMPORAL ARTERY;  Surgeon: Renford Dills, MD;  Location: [facility];  Service: Vascular;  Laterality: Right;   BREAST CYST ASPIRATION Left    CARDIAC CATHETERIZATION  02/15/2017   [facility]   COLONOSCOPY WITH PROPOFOL N/A 10/22/2017   Procedure: COLONOSCOPY WITH PROPOFOL;  Surgeon: Midge Minium, MD;  Location: [facility];  Service: Endoscopy;  Laterality: N/A;  specimens not taken--pt on Plavix will be brought back in after 7 days off med   COLONOSCOPY WITH PROPOFOL N/A 11/05/2017   Procedure: COLONOSCOPY WITH PROPOFOL;  Surgeon: Midge Minium, MD;   Location: [facility];  Service: Endoscopy;  Laterality: N/A;   COLONOSCOPY WITH PROPOFOL N/A 11/13/2022   Procedure: COLONOSCOPY WITH PROPOFOL;  Surgeon: Midge Minium, MD;  Location: [facility];  Service: Endoscopy;  Laterality: N/A;   ESOPHAGOGASTRODUODENOSCOPY N/A 12/21/2014   Procedure: ESOPHAGOGASTRODUODENOSCOPY (EGD);  Surgeon: Midge Minium, MD;  Location: [facility];  Service: Gastroenterology;  Laterality: N/A;   ESOPHAGOGASTRODUODENOSCOPY (EGD) WITH PROPOFOL N/A 02/08/2016   Procedure: ESOPHAGOGASTRODUODENOSCOPY (EGD) WITH PROPOFOL;  Surgeon: Midge Minium, MD;  Location: [facility];  Service: Endoscopy;  Laterality: N/A;   FASCIOTOMY Right 05/30/2019   Procedure: FASCIOTOMY;  Surgeon: Renford Dills, MD;  Location: [facility];  Service: Vascular;  Laterality: Right;   FASCIOTOMY CLOSURE Right 06/04/2019   Procedure: FASCIOTOMY CLOSURE;  Surgeon: Renford Dills, MD;  Location: [facility];  Service: Vascular;  Laterality: Right;   HEMORROIDECTOMY  2014   LOWER EXTREMITY ANGIOGRAPHY Right 05/30/2019   Procedure: LOWER EXTREMITY ANGIOGRAPHY;  Surgeon: Renford Dills, MD;  Location: [facility];  Service: Cardiovascular;  Laterality: Right;   LOWER EXTREMITY ANGIOGRAPHY Right 07/02/2020   Procedure: LOWER EXTREMITY ANGIOGRAPHY;  Surgeon: Renford Dills, MD;  Location: [facility];  Service: Cardiovascular;  Laterality: Right;   PERIPHERAL VASCULAR CATHETERIZATION N/A  02/09/2016   Procedure: Abdominal Aortogram w/Lower Extremity;  Surgeon: Renford Dills, MD;  Location: [facility];  Service: Cardiovascular;  Laterality: N/A;   PERIPHERAL VASCULAR CATHETERIZATION Right 02/10/2016   Procedure: Lower Extremity Angiography;  Surgeon: Renford Dills, MD;  Location: [facility];  Service: Cardiovascular;  Laterality: Right;   POLYPECTOMY  11/05/2017   Procedure: POLYPECTOMY;  Surgeon: Midge Minium, MD;  Location: [facility]  SURGERY [facility];  Service: Endoscopy;;   POLYPECTOMY  11/13/2022   Procedure: POLYPECTOMY;  Surgeon: Midge Minium, MD;  Location: [facility];  Service: Endoscopy;;   SHOULDER ARTHROSCOPY WITH ROTATOR CUFF REPAIR AND SUBACROMIAL DECOMPRESSION Right 02/27/2020   Procedure: RIGHT SHOULDER ARTHROSCOPY SUBACROMIAL DECOMPRESSION, DISTAL CLAVICLE EXCISION AND MINI-OPEN ROTATOR CUFF REPAIR;  Surgeon: Juanell Fairly, MD;  Location: [facility];  Service: Orthopedics;  Laterality: Right;   TONSILLECTOMY     VASCULAR SURGERY  [id number]   Fem-Pop Bypass     Current Outpatient Medications  Medication Sig Dispense Refill   apixaban (ELIQUIS) 2.5 MG TABS tablet Take 2.5 mg by mouth 2 (two) times daily.     atorvastatin (LIPITOR) 40 MG tablet Take 40 mg by mouth at bedtime.      Blood Pressure Monitoring (BLOOD PRESSURE CUFF) MISC 1 Units by Does not apply route daily as needed. 1 each 0   Calcium Carb-Cholecalciferol 600-10 MG-MCG TABS Take 1 tablet by mouth 2 (two) times daily.     digoxin (LANOXIN) 0.125 MG tablet Take 125 mcg by mouth daily.     DULoxetine (CYMBALTA) 60 MG capsule TAKE 1 CAPSULE BY MOUTH TWICE A DAY 180 capsule 3   ivabradine (CORLANOR) 7.5 MG TABS tablet Take 1 tablet (7.5 mg total) by mouth 2 (two) times daily with a meal. 60 tablet 3   lidocaine (LIDODERM) 5 % Place 1 patch onto the skin daily.     losartan (COZAAR) 25 MG tablet Take 25 mg by mouth every morning.      omeprazole (PRILOSEC) 40 MG capsule Take 40 mg by mouth daily as needed.     triamcinolone ointment (KENALOG) 0.5 % Apply 1 Application topically 2 (two) times daily. 30 g 3   zolpidem (AMBIEN) 10 MG tablet TAKE 1 TABLET BY MOUTH NIGHTLY AT BEDTIME AS NEEDED FOR INSOMNIA 30 tablet 1   gabapentin (NEURONTIN) 800 MG tablet Take 1 tablet (800 mg total) by mouth 3 (three) times daily. 90 tablet 0   No current facility-administered medications for this visit.    Allergies:   Hydrocodone-acetaminophen and Diltiazem     Social History:   reports that she quit smoking about 31 years ago. Her smoking use included cigarettes. She started smoking about 56 years ago. She has a 50 pack-year smoking history. She has never used smokeless tobacco. She reports that she does not drink alcohol and does not use drugs.   Family History:  family history includes CVA in her mother; Cancer in her father; Colon cancer in her father; Heart attack in her mother; Heart disease in her maternal uncle.    ROS:     Review of Systems  Constitutional: Negative.   HENT: Negative.    Eyes: Negative.   Respiratory: Negative.    Gastrointestinal: Negative.   Genitourinary: Negative.   Musculoskeletal: Negative.   Skin: Negative.   Neurological: Negative.   Endo/Heme/Allergies: Negative.   Psychiatric/Behavioral: Negative.    All other systems reviewed and are negative.     All other systems are reviewed and  negative.    PHYSICAL EXAM: VS:  BP (!) 170/78   Pulse 81   Ht 5\' 3"  (1.6 m)   Wt 137 lb (62.1 kg)   SpO2 97%   BMI 24.27 kg/m  , BMI Body mass index is 24.27 kg/m. Last weight:  Wt Readings from Last 3 Encounters:  08/03/23 137 lb (62.1 kg)  08/01/23 145 lb (65.8 kg)  07/30/23 130 lb (59 kg)     Physical Exam Constitutional:      Appearance: Normal appearance.  Cardiovascular:     Rate and Rhythm: Normal rate and regular rhythm.     Heart sounds: Normal heart sounds.  Pulmonary:     Effort: Pulmonary effort is normal.     Breath sounds: Normal breath sounds.  Musculoskeletal:     Right lower leg: No edema.     Left lower leg: No edema.  Neurological:     Mental Status: She is alert.       EKG:   Recent Labs: 10/09/2022: Hemoglobin 14.8; Platelets 197 01/30/2023: ALT 64; BUN 11; Creatinine, Ser 0.81; Potassium 4.8; Sodium 141 05/15/2023: TSH 2.110    Lipid Panel    Component Value Date/Time   CHOL 110 05/15/2023 0855   TRIG 67 05/15/2023 0855   HDL 52 05/15/2023 0855   CHOLHDL 2.1  05/15/2023 0855   LDLCALC 44 05/15/2023 0855      Other studies Reviewed: Additional studies/ records that were reviewed today include:  Review of the above records demonstrates:       No data to display            ASSESSMENT AND PLAN:    ICD-10-CM   1. S/P AKA (above knee amputation), right (HCC)  Z89.611     2. Sinus tachycardia  R00.0     3. Ischemic leg  I99.8     4. Non-rheumatic mitral regurgitation  I34.0     5. Inappropriate sinus tachycardia (HCC)  I47.11    Unable to tolerate metoprolol, and is corlanor due to that. Has severe PVD needing amputation, thus metoprolol contraindicated.    6. Mixed hyperlipidemia  E78.2     7. Primary hypertension  I10    Repeat BP 120/70    8. 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    Total time spent: 30 minutes  Signed,  [employer]  Welton Flakes, MD  08/03/2023 11:14 AM    [facility]

## 2023-08-09 ENCOUNTER — Telehealth (INDEPENDENT_AMBULATORY_CARE_PROVIDER_SITE_OTHER): Payer: Self-pay

## 2023-08-09 NOTE — Telephone Encounter (Signed)
Neuro called about a medical clearance form to be signed for [facility]. She is having surgery on 08/28/23.

## 2023-08-13 ENCOUNTER — Encounter
Admission: RE | Admit: 2023-08-13 | Discharge: 2023-08-13 | Disposition: A | Discharge status: Home / Self Care | Payer: Medicare PPO | Source: Ambulatory Visit | Attending: Neurosurgery | Admitting: Neurosurgery

## 2023-08-13 ENCOUNTER — Other Ambulatory Visit: Payer: Self-pay

## 2023-08-13 VITALS — BP 168/77 | HR 80 | Temp 98.1°F | Resp 20 | Ht 63.0 in | Wt 137.0 lb

## 2023-08-13 DIAGNOSIS — Z860100 Personal history of colon polyps, unspecified: Secondary | ICD-10-CM | POA: Insufficient documentation

## 2023-08-13 DIAGNOSIS — Z01812 Encounter for preprocedural laboratory examination: Secondary | ICD-10-CM | POA: Diagnosis present

## 2023-08-13 DIAGNOSIS — R9431 Abnormal electrocardiogram [ECG] [EKG]: Secondary | ICD-10-CM | POA: Insufficient documentation

## 2023-08-13 DIAGNOSIS — E0849 Diabetes mellitus due to underlying condition with other diabetic neurological complication: Secondary | ICD-10-CM

## 2023-08-13 DIAGNOSIS — Z01818 Encounter for other preprocedural examination: Secondary | ICD-10-CM | POA: Insufficient documentation

## 2023-08-13 DIAGNOSIS — E782 Mixed hyperlipidemia: Secondary | ICD-10-CM | POA: Diagnosis not present

## 2023-08-13 DIAGNOSIS — I739 Peripheral vascular disease, unspecified: Secondary | ICD-10-CM | POA: Diagnosis not present

## 2023-08-13 DIAGNOSIS — I10 Essential (primary) hypertension: Secondary | ICD-10-CM | POA: Diagnosis not present

## 2023-08-13 DIAGNOSIS — Z01810 Encounter for preprocedural cardiovascular examination: Secondary | ICD-10-CM | POA: Diagnosis not present

## 2023-08-13 LAB — BASIC METABOLIC PANEL
Anion gap: 11 (ref 5–15)
BUN: 10 mg/dL (ref 8–23)
CO2: 25 mmol/L (ref 22–32)
Calcium: 9.4 mg/dL (ref 8.9–10.3)
Chloride: 100 mmol/L (ref 98–111)
Creatinine, Ser: 0.84 mg/dL (ref 0.44–1.00)
GFR, Estimated: >60 mL/min (ref >60)
Glucose, Bld: 117 mg/dL — ABNORMAL HIGH (ref 70–99)
Potassium: 4.5 mmol/L (ref 3.5–5.1)
Sodium: 136 mmol/L (ref 135–145)

## 2023-08-13 LAB — CBC
HCT: 47.5 % — ABNORMAL HIGH (ref 36.0–46.0)
Hemoglobin: 15.9 g/dL — ABNORMAL HIGH (ref 12.0–15.0)
MCH: 32 pg (ref 26.0–34.0)
MCHC: 33.5 g/dL (ref 30.0–36.0)
MCV: 95.6 fL (ref 80.0–100.0)
Platelets: 155 10*3/uL (ref 150–400)
RBC: 4.97 MIL/uL (ref 3.87–5.11)
RDW: 13.1 % (ref 11.5–15.5)
WBC: 5.9 10*3/uL (ref 4.0–10.5)
nRBC: 0 % (ref 0.0–0.2)

## 2023-08-13 NOTE — Patient Instructions (Addendum)
Your procedure is scheduled ON:GEXBMWU January 7  Report to the Registration Desk on the 1st floor of the [facility] To find out your arrival time, please call [phone] between 1PM - 3PM on: Monday January  6 If your arrival time is 6:00 am, do not arrive before that time as the [facility] entrance doors do not open until 6:00 am.  REMEMBER: Instructions that are not followed completely may result in serious medical risk, up to and including death; or upon the discretion of your surgeon and anesthesiologist your surgery may need to be rescheduled.  Do not eat food after midnight the night before surgery.  No gum chewing or hard candies.  You may however, drink CLEAR liquids up to 2 hours before you are scheduled to arrive for your surgery. Do not drink anything within 2 hours of your scheduled arrival time.  Clear liquids include: - water  - apple juice without pulp - gatorade (not RED colors) - black coffee or tea (Do NOT add milk or creamers to the coffee or tea) Do NOT drink anything that is not on this list.   One week prior to surgery: Stop Anti-inflammatories (NSAIDS) such as Advil, Aleve, Ibuprofen, Motrin, Naproxen, Naprosyn and Aspirin based products such as Excedrin, Goody's Powder, BC Powder. Stop ANY OVER THE COUNTER supplements until after surgery.  You may however, continue to take Tylenol if needed for pain up until the day of surgery.   **Follow recommendations regarding stopping blood thinners.** apixaban (ELIQUIS) hold 3 days prior to surgery, last dose Friday January 3   Continue taking all of your other prescription medications up until the day of surgery.  ON THE DAY OF SURGERY ONLY TAKE THESE MEDICATIONS WITH SIPS OF WATER:  digoxin (LANOXIN)  DULoxetine (CYMBALTA)  gabapentin (NEURONTIN)  omeprazole (PRILOSEC)   No Alcohol for 24 hours before or after surgery.  No Smoking including e-cigarettes for 24 hours before surgery.  No chewable  tobacco products for at least 6 hours before surgery.  No nicotine patches on the day of surgery.  Do not use any "recreational" drugs for at least a week (preferably 2 weeks) before your surgery.  Please be advised that the combination of cocaine and anesthesia may have negative outcomes, up to and including death. If you test positive for cocaine, your surgery will be cancelled.  On the morning of surgery brush your teeth with toothpaste and water, you may rinse your mouth with mouthwash if you wish. Do not swallow any toothpaste or mouthwash.  Use CHG wipes as directed on instruction sheet.  Do not wear jewelry, make-up, hairpins, clips or nail polish.  For welded (permanent) jewelry: bracelets, anklets, waist bands, etc.  Please have this removed prior to surgery.  If it is not removed, there is a chance that hospital personnel will need to cut it off on the day of surgery.  Do not wear lotions, powders, or perfumes.   Do not shave body hair from the neck down 48 hours before surgery.  Contact lenses, hearing aids and dentures may not be worn into surgery.  Do not bring valuables to the hospital. [facility] is not responsible for any missing/lost belongings or valuables.   Notify your doctor if there is any change in your medical condition (cold, fever, infection).  Wear comfortable clothing (specific to your surgery type) to the hospital.  After surgery, you can help prevent lung complications by doing breathing exercises.  Take deep breaths and cough every  1-2 hours.   If you are being discharged the day of surgery, you will not be allowed to drive home. You will need a responsible individual to drive you home and stay with you for 24 hours after surgery.   If you are taking public transportation, you will need to have a responsible individual with you.  Please call the Pre-admissions Testing Dept. at [phone] if you have any questions about these  instructions.  Surgery Visitation Policy:  Patients having surgery or a procedure may have two visitors.  Children under the age of 5 must have an adult with them who is not the patient.       Preparing the Skin Before Surgery     To help prevent the risk of infection at your surgical site, we are now providing you with rinse-free Sage 2% Chlorhexidine Gluconate (CHG) disposable wipes.  Chlorhexidine Gluconate (CHG) Soap  o An antiseptic cleaner that kills germs and bonds with the skin to continue killing germs even after washing  o Used for showering the night before surgery and morning of surgery  The night before surgery: Shower or bathe with warm water. Do not apply perfume, lotions, powders. Wait one hour after shower. Skin should be dry and cool. Open Sage wipe package - use 6 disposable cloths. Wipe body using one cloth for the right arm, one cloth for the left arm, one cloth for the right leg, one cloth for the left leg, one cloth for the chest/abdomen area, and one cloth for the back. Do not use on open wounds or sores. Do not use on face or genitals (private parts). If you are breast feeding, do not use on breasts. 5. Do not rinse, allow to dry. 6. Skin may feel "tacky" for several minutes. 7. Dress in clean clothes. 8. Place clean sheets on your bed and do not sleep with pets.  REPEAT ABOVE ON THE MORNING OF SURGERY BEFORE ARRIVING TO THE HOSPITAL.

## 2023-08-17 DIAGNOSIS — Z01818 Encounter for other preprocedural examination: Secondary | ICD-10-CM | POA: Diagnosis not present

## 2023-08-17 DIAGNOSIS — R9431 Abnormal electrocardiogram [ECG] [EKG]: Secondary | ICD-10-CM | POA: Diagnosis not present

## 2023-08-17 DIAGNOSIS — I10 Essential (primary) hypertension: Secondary | ICD-10-CM | POA: Diagnosis not present

## 2023-08-17 DIAGNOSIS — E782 Mixed hyperlipidemia: Secondary | ICD-10-CM | POA: Diagnosis not present

## 2023-08-17 DIAGNOSIS — I739 Peripheral vascular disease, unspecified: Secondary | ICD-10-CM | POA: Diagnosis not present

## 2023-08-17 DIAGNOSIS — Z860100 Personal history of colon polyps, unspecified: Secondary | ICD-10-CM | POA: Diagnosis not present

## 2023-08-17 LAB — SURGICAL PCR SCREEN
MRSA, PCR: NEGATIVE
Staphylococcus aureus: NEGATIVE

## 2023-08-17 NOTE — Pre-Procedure Instructions (Signed)
Medical Clearance on chart from [employer] FNP-Low risk  Vascular clearance on chart from Dr Waymon Amato Risk-Hold Eliquis 3 days prior to surgery-Resume 14 days after surgery

## 2023-08-28 ENCOUNTER — Ambulatory Visit: Payer: Self-pay | Admitting: Certified Registered"

## 2023-08-28 ENCOUNTER — Other Ambulatory Visit: Payer: Self-pay

## 2023-08-28 ENCOUNTER — Ambulatory Visit: Payer: Medicare PPO

## 2023-08-28 ENCOUNTER — Encounter: Payer: Self-pay | Admitting: Neurosurgery

## 2023-08-28 ENCOUNTER — Ambulatory Visit
Admission: RE | Admit: 2023-08-28 | Discharge: 2023-08-28 | Disposition: A | Discharge status: Home / Self Care | Payer: Medicare PPO | Attending: Neurosurgery | Admitting: Neurosurgery

## 2023-08-28 ENCOUNTER — Encounter
Admission: RE | Disposition: A | Discharge status: Home / Self Care | Payer: Self-pay | Source: Home / Self Care | Attending: Neurosurgery

## 2023-08-28 DIAGNOSIS — Z87891 Personal history of nicotine dependence: Secondary | ICD-10-CM | POA: Diagnosis not present

## 2023-08-28 DIAGNOSIS — F32A Depression, unspecified: Secondary | ICD-10-CM | POA: Diagnosis not present

## 2023-08-28 DIAGNOSIS — G546 Phantom limb syndrome with pain: Secondary | ICD-10-CM | POA: Diagnosis not present

## 2023-08-28 DIAGNOSIS — G894 Chronic pain syndrome: Secondary | ICD-10-CM | POA: Insufficient documentation

## 2023-08-28 DIAGNOSIS — Z79899 Other long term (current) drug therapy: Secondary | ICD-10-CM | POA: Insufficient documentation

## 2023-08-28 DIAGNOSIS — K219 Gastro-esophageal reflux disease without esophagitis: Secondary | ICD-10-CM | POA: Diagnosis not present

## 2023-08-28 DIAGNOSIS — Z7901 Long term (current) use of anticoagulants: Secondary | ICD-10-CM | POA: Diagnosis not present

## 2023-08-28 DIAGNOSIS — M4854XA Collapsed vertebra, not elsewhere classified, thoracic region, initial encounter for fracture: Secondary | ICD-10-CM | POA: Insufficient documentation

## 2023-08-28 DIAGNOSIS — Z86718 Personal history of other venous thrombosis and embolism: Secondary | ICD-10-CM | POA: Diagnosis not present

## 2023-08-28 DIAGNOSIS — Z89611 Acquired absence of right leg above knee: Secondary | ICD-10-CM | POA: Insufficient documentation

## 2023-08-28 DIAGNOSIS — M48061 Spinal stenosis, lumbar region without neurogenic claudication: Secondary | ICD-10-CM | POA: Insufficient documentation

## 2023-08-28 DIAGNOSIS — Z01818 Encounter for other preprocedural examination: Secondary | ICD-10-CM

## 2023-08-28 DIAGNOSIS — I2510 Atherosclerotic heart disease of native coronary artery without angina pectoris: Secondary | ICD-10-CM | POA: Diagnosis not present

## 2023-08-28 DIAGNOSIS — M5416 Radiculopathy, lumbar region: Secondary | ICD-10-CM | POA: Diagnosis not present

## 2023-08-28 DIAGNOSIS — E1151 Type 2 diabetes mellitus with diabetic peripheral angiopathy without gangrene: Secondary | ICD-10-CM | POA: Diagnosis not present

## 2023-08-28 DIAGNOSIS — M5414 Radiculopathy, thoracic region: Secondary | ICD-10-CM | POA: Diagnosis not present

## 2023-08-28 DIAGNOSIS — Z01812 Encounter for preprocedural laboratory examination: Secondary | ICD-10-CM

## 2023-08-28 DIAGNOSIS — I10 Essential (primary) hypertension: Secondary | ICD-10-CM | POA: Insufficient documentation

## 2023-08-28 DIAGNOSIS — M4856XA Collapsed vertebra, not elsewhere classified, lumbar region, initial encounter for fracture: Secondary | ICD-10-CM | POA: Diagnosis not present

## 2023-08-28 HISTORY — PX: THORACIC LAMINECTOMY FOR SPINAL CORD STIMULATOR: SHX6887

## 2023-08-28 LAB — TYPE AND SCREEN
ABO/RH(D): O POS
Antibody Screen: NEGATIVE

## 2023-08-28 SURGERY — THORACIC LAMINECTOMY FOR SPINAL CORD STIMULATOR
Anesthesia: General | Site: Spine Thoracic

## 2023-08-28 MED ORDER — BUPIVACAINE-EPINEPHRINE (PF) 0.5% -1:200000 IJ SOLN
INTRAMUSCULAR | Status: DC | PRN
  Administered 2023-08-28: 20 mL

## 2023-08-28 MED ORDER — LIDOCAINE HCL (PF) 2 % IJ SOLN
INTRAMUSCULAR | Status: AC
  Filled 2023-08-28: qty 5

## 2023-08-28 MED ORDER — MIDAZOLAM HCL 2 MG/2ML IJ SOLN
INTRAMUSCULAR | Status: AC
Start: 2023-08-28 — End: ?
  Filled 2023-08-28: qty 2

## 2023-08-28 MED ORDER — SUCCINYLCHOLINE CHLORIDE 200 MG/10ML IV SOSY
PREFILLED_SYRINGE | INTRAVENOUS | Status: AC
Start: 2023-08-28 — End: ?
  Filled 2023-08-28: qty 10

## 2023-08-28 MED ORDER — ONDANSETRON HCL 4 MG/2ML IJ SOLN
INTRAMUSCULAR | Status: DC | PRN
  Administered 2023-08-28: 4 mg via INTRAVENOUS

## 2023-08-28 MED ORDER — PROPOFOL 10 MG/ML IV BOLUS
INTRAVENOUS | Status: DC | PRN
  Administered 2023-08-28: 80 mg via INTRAVENOUS

## 2023-08-28 MED ORDER — MIDAZOLAM HCL 2 MG/2ML IJ SOLN
INTRAMUSCULAR | Status: DC | PRN
  Administered 2023-08-28: 2 mg via INTRAVENOUS

## 2023-08-28 MED ORDER — SENNA 8.6 MG PO TABS: 1.0000 | ORAL_TABLET | Freq: Every day | ORAL | 0 refills | Status: DC

## 2023-08-28 MED ORDER — CHLORHEXIDINE GLUCONATE 0.12 % MT SOLN
OROMUCOSAL | Status: AC
  Filled 2023-08-28: qty 15

## 2023-08-28 MED ORDER — ACETAMINOPHEN 10 MG/ML IV SOLN
INTRAVENOUS | Status: DC | PRN
  Administered 2023-08-28: 1000 mg via INTRAVENOUS

## 2023-08-28 MED ORDER — ORAL CARE MOUTH RINSE: 15.0000 mL | Freq: Once | OROMUCOSAL | Status: AC

## 2023-08-28 MED ORDER — KETOROLAC TROMETHAMINE 30 MG/ML IJ SOLN
INTRAMUSCULAR | Status: DC | PRN
  Administered 2023-08-28: 15 mg via INTRAVENOUS

## 2023-08-28 MED ORDER — LIDOCAINE HCL (CARDIAC) PF 100 MG/5ML IV SOSY
PREFILLED_SYRINGE | INTRAVENOUS | Status: DC | PRN
  Administered 2023-08-28: 60 mg via INTRAVENOUS

## 2023-08-28 MED ORDER — PROPOFOL 500 MG/50ML IV EMUL
INTRAVENOUS | Status: DC | PRN
  Administered 2023-08-28: 120 ug/kg/min via INTRAVENOUS
  Administered 2023-08-28: 100 ug/kg/min via INTRAVENOUS

## 2023-08-28 MED ORDER — 0.9 % SODIUM CHLORIDE (POUR BTL) OPTIME
TOPICAL | Status: DC | PRN
  Administered 2023-08-28: 500 mL

## 2023-08-28 MED ORDER — OXYCODONE HCL 5 MG PO TABS: 5.0000 mg | ORAL_TABLET | Freq: Once | ORAL | Status: DC | PRN

## 2023-08-28 MED ORDER — PROPOFOL 1000 MG/100ML IV EMUL
INTRAVENOUS | Status: AC
Start: 2023-08-28 — End: ?
  Filled 2023-08-28: qty 100

## 2023-08-28 MED ORDER — SUCCINYLCHOLINE CHLORIDE 200 MG/10ML IV SOSY
PREFILLED_SYRINGE | INTRAVENOUS | Status: DC | PRN
  Administered 2023-08-28: 70 mg via INTRAVENOUS

## 2023-08-28 MED ORDER — SODIUM CHLORIDE 0.9 % IV SOLN
INTRAVENOUS | Status: DC | PRN
  Administered 2023-08-28: .05 ug/kg/min via INTRAVENOUS

## 2023-08-28 MED ORDER — VANCOMYCIN HCL IN DEXTROSE 1-5 GM/200ML-% IV SOLN
1000.0000 mg | Freq: Once | INTRAVENOUS | Status: AC
  Administered 2023-08-28: 1000 mg via INTRAVENOUS

## 2023-08-28 MED ORDER — VANCOMYCIN HCL IN DEXTROSE 1-5 GM/200ML-% IV SOLN
INTRAVENOUS | Status: AC
  Filled 2023-08-28: qty 200

## 2023-08-28 MED ORDER — DEXAMETHASONE SODIUM PHOSPHATE 10 MG/ML IJ SOLN
INTRAMUSCULAR | Status: DC | PRN
  Administered 2023-08-28: 10 mg via INTRAVENOUS

## 2023-08-28 MED ORDER — SODIUM CHLORIDE (PF) 0.9 % IJ SOLN
INTRAMUSCULAR | Status: DC | PRN
  Administered 2023-08-28: 60 mL via INTRAMUSCULAR

## 2023-08-28 MED ORDER — IRRISEPT - 450ML BOTTLE WITH 0.05% CHG IN STERILE WATER, USP 99.95% OPTIME
TOPICAL | Status: DC | PRN
  Administered 2023-08-28: 450 mL

## 2023-08-28 MED ORDER — GLYCOPYRROLATE 0.2 MG/ML IJ SOLN
INTRAMUSCULAR | Status: AC
  Filled 2023-08-28: qty 1

## 2023-08-28 MED ORDER — SODIUM CHLORIDE (PF) 0.9 % IJ SOLN
INTRAMUSCULAR | Status: AC
  Filled 2023-08-28: qty 20

## 2023-08-28 MED ORDER — BUPIVACAINE HCL (PF) 0.5 % IJ SOLN
INTRAMUSCULAR | Status: AC
  Filled 2023-08-28: qty 30

## 2023-08-28 MED ORDER — DEXAMETHASONE SODIUM PHOSPHATE 10 MG/ML IJ SOLN
INTRAMUSCULAR | Status: AC
  Filled 2023-08-28: qty 1

## 2023-08-28 MED ORDER — BUPIVACAINE-EPINEPHRINE (PF) 0.5% -1:200000 IJ SOLN
INTRAMUSCULAR | Status: AC
  Filled 2023-08-28: qty 10

## 2023-08-28 MED ORDER — FENTANYL CITRATE (PF) 100 MCG/2ML IJ SOLN
INTRAMUSCULAR | Status: AC
  Filled 2023-08-28: qty 2

## 2023-08-28 MED ORDER — BUPIVACAINE LIPOSOME 1.3 % IJ SUSP
INTRAMUSCULAR | Status: AC
  Filled 2023-08-28: qty 20

## 2023-08-28 MED ORDER — ONDANSETRON HCL 4 MG/2ML IJ SOLN
INTRAMUSCULAR | Status: AC
  Filled 2023-08-28: qty 2

## 2023-08-28 MED ORDER — FENTANYL CITRATE (PF) 100 MCG/2ML IJ SOLN
INTRAMUSCULAR | Status: DC | PRN
  Administered 2023-08-28 (×2): 50 ug via INTRAVENOUS

## 2023-08-28 MED ORDER — LACTATED RINGERS IV SOLN
INTRAVENOUS | Status: DC
Start: 2023-08-28 — End: 2023-08-28

## 2023-08-28 MED ORDER — APIXABAN 2.5 MG PO TABS: ORAL_TABLET | ORAL | 0 refills | Status: DC

## 2023-08-28 MED ORDER — CEFAZOLIN SODIUM-DEXTROSE 2-4 GM/100ML-% IV SOLN
2.0000 g | Freq: Once | INTRAVENOUS | Status: AC
Start: 2023-08-28 — End: 2023-08-28
  Administered 2023-08-28: 2 g via INTRAVENOUS

## 2023-08-28 MED ORDER — DOCUSATE SODIUM 100 MG PO CAPS: 100.0000 mg | ORAL_CAPSULE | Freq: Two times a day (BID) | ORAL | 0 refills | Status: DC

## 2023-08-28 MED ORDER — PHENYLEPHRINE HCL-NACL 20-0.9 MG/250ML-% IV SOLN
INTRAVENOUS | Status: DC | PRN
  Administered 2023-08-28: 35 ug/min via INTRAVENOUS

## 2023-08-28 MED ORDER — REMIFENTANIL HCL 1 MG IV SOLR
INTRAVENOUS | Status: AC
  Filled 2023-08-28: qty 1000

## 2023-08-28 MED ORDER — CHLORHEXIDINE GLUCONATE 0.12 % MT SOLN
15.0000 mL | Freq: Once | OROMUCOSAL | Status: AC
  Administered 2023-08-28: 15 mL via OROMUCOSAL

## 2023-08-28 MED ORDER — PROPOFOL 10 MG/ML IV BOLUS
INTRAVENOUS | Status: AC
Start: 2023-08-28 — End: ?
  Filled 2023-08-28: qty 40

## 2023-08-28 MED ORDER — FENTANYL CITRATE (PF) 100 MCG/2ML IJ SOLN: 25.0000 ug | INTRAMUSCULAR | Status: DC | PRN

## 2023-08-28 MED ORDER — OXYCODONE HCL 5 MG/5ML PO SOLN: 5.0000 mg | Freq: Once | ORAL | Status: DC | PRN

## 2023-08-28 MED ORDER — TRAMADOL HCL 50 MG PO TABS: 50.0000 mg | ORAL_TABLET | Freq: Four times a day (QID) | ORAL | 0 refills | Status: DC | PRN

## 2023-08-28 MED ORDER — ACETAMINOPHEN 10 MG/ML IV SOLN
INTRAVENOUS | Status: AC
  Filled 2023-08-28: qty 100

## 2023-08-28 MED ORDER — CEFAZOLIN SODIUM-DEXTROSE 2-4 GM/100ML-% IV SOLN
INTRAVENOUS | Status: AC
  Filled 2023-08-28: qty 100

## 2023-08-28 SURGICAL SUPPLY — 49 items
BELT PT INTERSTIM MICRO SYSTEM (MISCELLANEOUS) IMPLANT
BUR NEURO DRILL SOFT 3.0X3.8M (BURR) ×1 IMPLANT
CONTROLLER HANDSET COMM KIT (NEUROSURGERY SUPPLIES) IMPLANT
DERMABOND ADVANCED .7 DNX12 (GAUZE/BANDAGES/DRESSINGS) ×2 IMPLANT
DRAPE C ARM PK CFD 31 SPINE (DRAPES) ×1 IMPLANT
DRAPE C-ARM XRAY 36X54 (DRAPES) IMPLANT
DRAPE LAPAROTOMY 100X77 ABD (DRAPES) ×1 IMPLANT
DRSG OPSITE POSTOP 3X4 (GAUZE/BANDAGES/DRESSINGS) IMPLANT
DRSG OPSITE POSTOP 4X6 (GAUZE/BANDAGES/DRESSINGS) IMPLANT
DRSG OPSITE POSTOP 4X8 (GAUZE/BANDAGES/DRESSINGS) IMPLANT
ELECT REM PT RETURN 9FT ADLT (ELECTROSURGICAL) ×1
ELECTRODE REM PT RTRN 9FT ADLT (ELECTROSURGICAL) ×1 IMPLANT
ENVELOPE ABSORB ANTIBACTERIAL (Mesh General) ×1 IMPLANT
FEE INTRAOP CADWELL SUPPLY NCS (MISCELLANEOUS) IMPLANT
FEE INTRAOP MONITOR IMPULS NCS (MISCELLANEOUS) IMPLANT
GLOVE BIOGEL PI IND STRL 6.5 (GLOVE) ×1 IMPLANT
GLOVE SRG 8 PF TXTR STRL LF DI (GLOVE) ×1 IMPLANT
GLOVE SURG SYN 6.5 ES PF (GLOVE) ×1 IMPLANT
GLOVE SURG SYN 6.5 PF PI (GLOVE) ×1 IMPLANT
GLOVE SURG SYN 7.5 E (GLOVE) ×1 IMPLANT
GLOVE SURG SYN 7.5 PF PI (GLOVE) ×1 IMPLANT
GOWN SRG LRG LVL 4 IMPRV REINF (GOWNS) ×1 IMPLANT
GOWN SRG XL LVL 3 NONREINFORCE (GOWNS) ×1 IMPLANT
INTRAOP CADWELL SUPPLY FEE NCS (MISCELLANEOUS) ×1
INTRAOP MONITOR FEE IMPULS NCS (MISCELLANEOUS) ×1
JET LAVAGE IRRISEPT WOUND (IRRIGATION / IRRIGATOR) ×1
KIT SPINAL PRONEVIEW (KITS) ×1 IMPLANT
LAVAGE JET IRRISEPT WOUND (IRRIGATION / IRRIGATOR) ×1 IMPLANT
MANIFOLD NEPTUNE II (INSTRUMENTS) ×1 IMPLANT
MARKER SKIN DUAL TIP RULER LAB (MISCELLANEOUS) ×1 IMPLANT
NDL SAFETY ECLIPSE 18X1.5 (NEEDLE) ×1 IMPLANT
NEUROSTIM INCEPTIV (Neuro Prosthesis/Implant) IMPLANT
NEUROSTIMULATOR INCEPTIV (Neuro Prosthesis/Implant) ×1 IMPLANT
NS IRRIG 500ML POUR BTL (IV SOLUTION) ×1 IMPLANT
PACK LAMINECTOMY ARMC (PACKS) ×1 IMPLANT
PAD ARMBOARD 7.5X6 YLW CONV (MISCELLANEOUS) ×1 IMPLANT
POUCH TYRX ANTIBAC NEURO MED (Mesh General) IMPLANT
PROGRAMMER AND COMM CASE (NEUROSURGERY SUPPLIES) IMPLANT
RECHARGER SYSTEM (NEUROSURGERY SUPPLIES) IMPLANT
STAPLER SKIN PROX 35W (STAPLE) ×1 IMPLANT
STIMULATOR CORD SURESCAN MRI (Stimulator) IMPLANT
SURGIFLO W/THROMBIN 8M KIT (HEMOSTASIS) ×1 IMPLANT
SUT SILK 2 0SH CR/8 30 (SUTURE) ×1 IMPLANT
SUT STRATA 3-0 15 PS-2 (SUTURE) ×1 IMPLANT
SUT VIC AB 0 CT1 18XCR BRD 8 (SUTURE) ×1 IMPLANT
SUT VIC AB 2-0 CT1 18 (SUTURE) ×1 IMPLANT
SYR 10ML LL (SYRINGE) ×1 IMPLANT
SYR 30ML LL (SYRINGE) ×2 IMPLANT
TRAP FLUID SMOKE EVACUATOR (MISCELLANEOUS) ×1 IMPLANT

## 2023-08-28 NOTE — Anesthesia Postprocedure Evaluation (Signed)
 Anesthesia Post Note  Patient: Sara [name]  Procedure(s) Performed: THORACIC LAMINECTOMY FOR SPINAL CORD STIMULATOR PLACEMENT ([employer]) (Spine Thoracic)  Patient location during evaluation: PACU Anesthesia Type: General Level of consciousness: awake and alert Pain management: pain level controlled Vital Signs Assessment: post-procedure vital signs reviewed and stable Respiratory status: spontaneous breathing, nonlabored ventilation, respiratory function stable and patient connected to nasal cannula oxygen Cardiovascular status: blood pressure returned to baseline and stable Postop Assessment: no apparent nausea or vomiting Anesthetic complications: no   No notable events documented.   Last Vitals:  Vitals:   08/28/23 1030 08/28/23 1045  BP: 134/67 (!) 143/70  Pulse: 74 80  Resp: 16 18  Temp:  36.4 C  SpO2: 93% 92%    Last Pain:  Vitals:   08/28/23 1045  TempSrc: Temporal  PainSc: 0-No pain                 Lendia LITTIE Mae

## 2023-08-28 NOTE — Op Note (Signed)
 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 The patient had a successful trial for spinal cord stimulation, so was consented for placement of a permanent device   Findings: successful placement of a thoracic spinal cord stimulator.   Preoperative Diagnosis: Chronic Pain Syndrome, Post amputation stump pain Postoperative Diagnosis: same     EBL: 20 ml IVF: see anesthesia record Drains: none Disposition: Extubated and Stable to PACU Complications: mis-localization noted on verification xrays, had to have lead repositioned to a higher level. Disclosed to family and patient.    No foley catheter was placed.     Preoperative Note:    Risks of surgery discussed in clinic.   Operative Note:      The patient was then brought from the preoperative center with intravenous access established.  The patient underwent general anesthesia and endotracheal tube intubation, then was rotated on the [facility] table where all pressure points were appropriately padded.  An incision was marked with flouroscopy at based off of preoperative x-rays at the level of the kyphotic compression fracture as this was identified on her radiographic placement trial, and on the left flank. The skin was then thoroughly cleansed.  Perioperative antibiotic prophylaxis was administered.  Sterile prep and drapes were then applied and a timeout was then observed.     Once this was complete an incision was opened with the use of a #10 blade knife in the midline at the thoracic incision.  The paraspinus muscled were subperiosteally dissected until the suspected lamina of of T12 was visualized. Flouroscopy was used to confirm the level, verifying we were at the level of the kyphotic compression fracture. A self-retaining retractor was placed.   The rongeur was used to remove the spinous process of suspected T12.  The drill was used to thin  the bone until the ligamentum flavum was visualized.  The ligamentum was then removed and the dura visualized. This was widened until placement of the paddle lead was possible. The lead was then advanced cranially where we then noticed an additional kyphotic compression fracture.  This finding concern is that she had a new kyphotic compression fracture since her recent placement.  Because of this we relocalized counting from the bottom and top realizing we were approximately 2 levels low at the level of a new compression fracture rather than her localizing compression fracture.  We relocalized and then verifying that there was a new compression fracture at L2 so we placed an incision over the T12 lamina which had the localizing compression fracture.  We exposed subperiosteally.  We removed the ligamentum and spinous process.  We then used a high-speed drill to perform a laminectomy removing the ligamentum flavum, this was wide enough to place the paddle lead which we positioned.  Once this was in position we performed collision studies verifying we were getting stimulation into the right leg which was her symptomatic leg.  We compared the placement to the radiographic placement of her trial spanning from T12-T10.  Note we base to this nomenclature off of the radiographic placement on 12/2.  Older x-rays count in the compression fracture as T11 but in order to stay consistent we are using the same nomenclature as 12/2.   The incision on the flank was then opened and a pocket formed until it was large enough for the pulse generator.  The tunneler was used to connect between the pocket and the incision.  The lead was inserted into the tunneler and tunneled to the buttock.  The leads were attached to the IPG and impedances checked.  The leads were then tightened.  The IPG was then inserted into the antibiotic pouch   All sites were irrigated.  The wounds were closed in layers with 0 and 2-0 vicryl.  Long-acting  anesthetic was injected into the muscle and skin.  The skin was approximated with a running subcuticular stitch and glue on top.. A sterile dressing was applied.   Monitoring was stable throughout.  Final x-rays verified the position of the paddle lead and the battery.   Patient was then rotated back to the preoperative bed awakened from anesthesia and taken to recovery. All counts are correct in this case.   I performed the entire procedure with the assistance of Lyle Decamp PA as an designer, television/film set. An assistant was required for this procedure due to the complexity.  The assistant provided assistance in tissue manipulation and suction, and was required for the successful and safe performance of the procedure. I performed the critical portions of the procedure.  Implant Name Type Inv. Item Serial No. Manufacturer Lot No. LRB No. Used Action  STIMULATOR CORD SURESCAN MRI - SNA Stimulator STIMULATOR CORD SURESCAN MRI NA [employer] NEUROMOD PAIN MGMT [id number] N/A 1 Implanted  NEUROSTIMULATOR INCEPTIV - [id number] H Neuro Prosthesis/Implant NEUROSTIMULATOR INCEPTIV [id number] H [employer] NEUROMOD PAIN MGMT NA N/A 1 Implanted  ENVELOPE ABSORB ANTIBACTERIAL - SNA Mesh General ENVELOPE ABSORB ANTIBACTERIAL NA [employer] NEUROMOD PAIN MGMT [id number] N/A 1 Implanted    Penne MICAEL Sharps, MD

## 2023-08-28 NOTE — Anesthesia Preprocedure Evaluation (Signed)
 Anesthesia Evaluation  Patient identified by MRN, date of birth, ID band Patient awake    Reviewed: Allergy & Precautions, NPO status , Patient's Chart, lab work & pertinent test results  History of Anesthesia Complications Negative for: history of anesthetic complications  Airway Mallampati: III  TM Distance: >3 FB Neck ROM: full    Dental  (+) Upper Dentures   Pulmonary neg pulmonary ROS, former smoker   Pulmonary exam normal        Cardiovascular hypertension, On Medications + CAD and + Peripheral Vascular Disease  Normal cardiovascular exam+ Valvular Problems/Murmurs MR   Hx of dvt    Neuro/Psych  PSYCHIATRIC DISORDERS  Depression     Neuromuscular disease    GI/Hepatic Neg liver ROS, PUD,GERD  Medicated,,  Endo/Other  diabetes    Renal/GU      Musculoskeletal   Abdominal   Peds  Hematology negative hematology ROS (+)   Anesthesia Other Findings Past Medical History: No date: Arthritis     Comment:  right shoulder No date: Depression No date: Dyspnea No date: Employs prosthetic leg     Comment:  Right No date: GCA (giant cell arteritis) (HCC) No date: GERD (gastroesophageal reflux disease) No date: History of blood clots No date: Hyperlipidemia No date: Hypertension 11/2021: MGUS (monoclonal gammopathy of unknown significance) No date: Mild mitral regurgitation No date: Osteoporosis No date: Peripheral vascular disease (HCC) No date: Sarcoidosis No date: Stomach ulcer No date: Vascular disease     Comment:  Sees Dr. Jama No date: Vertigo     Comment:  Last episode approx Aug 2015 No date: Wears dentures     Comment:  full upper  Past Surgical History: No date: ADENOIDECTOMY 08/11/2020: AMPUTATION; Right     Comment:  Procedure: AMPUTATION ABOVE KNEE;  Surgeon: Jama Cordella MATSU, MD;  Location: [facility];  Service: Vascular;                Laterality: Right; 07/14/2019:  ARTERY BIOPSY; Right     Comment:  Procedure: BIOPSY TEMPORAL ARTERY;  Surgeon: Jama Cordella MATSU, MD;  Location: [facility];  Service: Vascular;                Laterality: Right; No date: BREAST CYST ASPIRATION; Left 02/15/2017: CARDIAC CATHETERIZATION     Comment:  [facility] 10/22/2017: COLONOSCOPY WITH PROPOFOL ; N/A     Comment:  Procedure: COLONOSCOPY WITH PROPOFOL ;  Surgeon: Jinny Carmine, MD;  Location: [facility];  Service:               Endoscopy;  Laterality: N/A;  specimens not taken--pt on               Plavix  will be brought back in after 7 days off med 11/05/2017: COLONOSCOPY WITH PROPOFOL ; N/A     Comment:  Procedure: COLONOSCOPY WITH PROPOFOL ;  Surgeon: Jinny Carmine, MD;  Location: [facility];  Service:               Endoscopy;  Laterality: N/A; 11/13/2022: COLONOSCOPY WITH PROPOFOL ; N/A     Comment:  Procedure: COLONOSCOPY WITH PROPOFOL ;  Surgeon: Jinny,  Darren, MD;  Location: [facility];  Service:               Endoscopy;  Laterality: N/A; 12/21/2014: ESOPHAGOGASTRODUODENOSCOPY; N/A     Comment:  Procedure: ESOPHAGOGASTRODUODENOSCOPY (EGD);  Surgeon:               Rogelia Copping, MD;  Location: [facility];                Service: Gastroenterology;  Laterality: N/A; 02/08/2016: ESOPHAGOGASTRODUODENOSCOPY (EGD) WITH PROPOFOL ; N/A     Comment:  Procedure: ESOPHAGOGASTRODUODENOSCOPY (EGD) WITH               PROPOFOL ;  Surgeon: Rogelia Copping, MD;  Location: [facility]               ENDOSCOPY;  Service: Endoscopy;  Laterality: N/A; 05/30/2019: FASCIOTOMY; Right     Comment:  Procedure: FASCIOTOMY;  Surgeon: Jama Cordella MATSU, MD;              Location: [facility];  Service: Vascular;  Laterality:               Right; 06/04/2019: FASCIOTOMY CLOSURE; Right     Comment:  Procedure: FASCIOTOMY CLOSURE;  Surgeon: Jama Cordella MATSU, MD;  Location: [facility];  Service: Vascular;                 Laterality: Right; 2014: HEMORROIDECTOMY 05/30/2019: LOWER EXTREMITY ANGIOGRAPHY; Right     Comment:  Procedure: LOWER EXTREMITY ANGIOGRAPHY;  Surgeon:               Jama Cordella MATSU, MD;  Location: [facility];               Service: Cardiovascular;  Laterality: Right; 07/02/2020: LOWER EXTREMITY ANGIOGRAPHY; Right     Comment:  Procedure: LOWER EXTREMITY ANGIOGRAPHY;  Surgeon:               Jama Cordella MATSU, MD;  Location: [facility];               Service: Cardiovascular;  Laterality: Right; 02/09/2016: PERIPHERAL VASCULAR CATHETERIZATION; N/A     Comment:  Procedure: Abdominal Aortogram w/Lower Extremity;                Surgeon: Cordella MATSU Jama, MD;  Location: [facility];  Service: Cardiovascular;  Laterality: N/A; 02/10/2016: PERIPHERAL VASCULAR CATHETERIZATION; Right     Comment:  Procedure: Lower Extremity Angiography;  Surgeon:               Cordella MATSU Jama, MD;  Location: [facility];                Service: Cardiovascular;  Laterality: Right; 11/05/2017: POLYPECTOMY     Comment:  Procedure: POLYPECTOMY;  Surgeon: Copping Rogelia, MD;                Location: [facility];  Service: Endoscopy;; 11/13/2022: POLYPECTOMY     Comment:  Procedure: POLYPECTOMY;  Surgeon: Copping Rogelia, MD;                Location: [facility];  Service: Endoscopy;; 02/27/2020: SHOULDER ARTHROSCOPY WITH ROTATOR CUFF REPAIR AND  SUBACROMIAL DECOMPRESSION; Right     Comment:  Procedure: RIGHT SHOULDER ARTHROSCOPY SUBACROMIAL  DECOMPRESSION, DISTAL CLAVICLE EXCISION AND MINI-OPEN               ROTATOR CUFF REPAIR;  Surgeon: Marchia Drivers, MD;                Location: [facility];  Service: Orthopedics;  Laterality:               Right; No date: TONSILLECTOMY 8008,7987: VASCULAR SURGERY     Comment:  Fem-Pop Bypass     Reproductive/Obstetrics negative OB ROS                               Anesthesia Physical Anesthesia Plan  ASA: 3  Anesthesia Plan: General ETT   Post-op Pain Management: Toradol  IV (intra-op)*, Ofirmev  IV (intra-op)*, Ketamine IV* and Precedex   Induction: Intravenous  PONV Risk Score and Plan: 3 and Ondansetron , Dexamethasone  and Treatment may vary due to age or medical condition  Airway Management Planned: Oral ETT  Additional Equipment:   Intra-op Plan:   Post-operative Plan: Extubation in OR  Informed Consent: I have reviewed the patients History and Physical, chart, labs and discussed the procedure including the risks, benefits and alternatives for the proposed anesthesia with the patient or authorized representative who has indicated his/her understanding and acceptance.     Dental Advisory Given  Plan Discussed with: Anesthesiologist, CRNA and Surgeon  Anesthesia Plan Comments: (Patient consented for risks of anesthesia including but not limited to:  - adverse reactions to medications - damage to eyes, teeth, lips or other oral mucosa - nerve damage due to positioning  - sore throat or hoarseness - Damage to heart, brain, nerves, lungs, other parts of body or loss of life  Patient voiced understanding and assent.)         Anesthesia Quick Evaluation

## 2023-08-28 NOTE — Anesthesia Procedure Notes (Signed)
 Procedure Name: Intubation Date/Time: 08/28/2023 7:29 AM  Performed by: Duwayne Craven, CRNAPre-anesthesia Checklist: Patient identified, Patient being monitored, Timeout performed, Emergency Drugs available and Suction available Patient Re-evaluated:Patient Re-evaluated prior to induction Oxygen Delivery Method: Circle system utilized Preoxygenation: Pre-oxygenation with 100% oxygen Induction Type: IV induction Ventilation: Mask ventilation without difficulty and Oral airway inserted - appropriate to patient size Laryngoscope Size: 3 and McGrath Grade View: Grade I Tube type: Oral Tube size: 6.5 mm Number of attempts: 1 Airway Equipment and Method: Stylet, Video-laryngoscopy and Bite block Placement Confirmation: ETT inserted through vocal cords under direct vision, positive ETCO2 and breath sounds checked- equal and bilateral Secured at: 20 cm Tube secured with: Tape Dental Injury: Teeth and Oropharynx as per pre-operative assessment

## 2023-08-28 NOTE — Discharge Instructions (Addendum)
 The following are instructions to help in your recovery once you have been discharged from the hospital. Even if you feel well, it is important that you follow these activity guidelines.   Do not continue Eliquis  until 14 days post-op  Activity    No bending, lifting, or twisting ("BLT"). Avoid lifting objects heavier than 10 pounds (gallon milk jug).  Where possible, avoid household activities that involve lifting, bending, reaching, pushing, or pulling such as laundry, vacuuming, grocery shopping, and childcare. Try to arrange for help from friends and family for these activities while your back heals.  Increase physical activity slowly as tolerated.  Taking short walks is encouraged, but avoid strenuous exercise. Do not jog, run, bicycle, lift weights, or participate in any other exercises unless specifically allowed by your doctor.  Talk to your doctor before resuming sexual activity.  You should not drive until cleared by your doctor.  Until released by your doctor, you should not return to work or school.  You should rest at home and let your body heal.   You may shower three days after your surgery.  After showering, lightly dab your incision dry. Do not take a tub bath or go swimming until approved by your doctor at your follow-up appointment.  If you smoke, we strongly recommend that you quit.  Smoking has been proven to interfere with normal bone healing and will dramatically reduce the success rate of your surgery. Please contact [facility] (800-QUIT-NOW) and use the resources at www.[facility].com for assistance in stopping smoking.  Surgical Incision   If you have a dressing on your incision, you may remove it two days after your surgery. Keep your incision area clean and dry.  If you have staples or stitches on your incision, you should have a follow up scheduled for removal. If you do not have staples or stitches, you will have steri-strips (small pieces of surgical tape) or  Dermabond glue. The steri-strips/glue should begin to peel away within about a week (it is fine if the steri-strips fall off before then). If the strips are still in place one week after your surgery, you may gently remove them.  Diet           You may return to your usual diet. However, you may experience discomfort when swallowing in the first month after your surgery. This is normal. You may find that softer foods are more comfortable for you to swallow. Be sure to stay hydrated.  When to Contact Us   You may experience pain in your neck and/or pain between your shoulder blades. This is normal and should improve in the next few weeks with the help of pain medication, muscle relaxers, and rest. Some patients report that a warm compress on the back of the neck or between the shoulder blades helps.  However, should you experience any of the following, contact us  immediately: New numbness or weakness Pain that is progressively getting worse, and is not relieved by your pain medication, muscle relaxers, rest, and warm compresses Bleeding, redness, swelling, pain, or drainage from surgical incision Chills or flu-like symptoms Fever greater than 101.0 F (38.3 C) Inability to eat, drink fluids, or take medications Problems with bowel or bladder functions Difficulty breathing or shortness of breath Warmth, tenderness, or swelling in your calf Contact Information How to contact us :  If you have any questions/concerns before or after surgery, you can reach us  at [phone], or you can send a mychart message. We can be reached by phone  or mychart 8am-4pm, Monday-Friday.  *Please note: Calls after 4pm are forwarded to a third party answering service. Mychart messages are not routinely monitored during evenings, weekends, and holidays. Please call our office to contact the answering service for urgent concerns during non-business hours.

## 2023-08-28 NOTE — Interval H&P Note (Signed)
 History and Physical Interval Note:  08/28/2023 7:02 AM  Sara [name]  has presented today for surgery, with the diagnosis of G89.4  Chronic pain syndrome G54.6  Phantom limb pain.  The various methods of treatment have been discussed with the patient and family. After consideration of risks, benefits and other options for treatment, the patient has consented to  Procedure(s): THORACIC LAMINECTOMY FOR SPINAL CORD STIMULATOR PLACEMENT ([employer]) (N/A) as a surgical intervention.  The patient's history has been reviewed, patient examined, no change in status, stable for surgery.  I have reviewed the patient's chart and labs.  Questions were answered to the patient's satisfaction.    Heart and lungs clear  [name]

## 2023-08-28 NOTE — Transfer of Care (Signed)
 Immediate Anesthesia Transfer of Care Note  Patient: Sara [name]  Procedure(s) Performed: THORACIC LAMINECTOMY FOR SPINAL CORD STIMULATOR PLACEMENT ([employer]) (Spine Thoracic)  Patient Location: PACU  Anesthesia Type:General  Level of Consciousness: awake, alert , and oriented  Airway & Oxygen Therapy: Patient Spontanous Breathing and Patient connected to face mask oxygen  Post-op Assessment: Report given to RN and Post -op Vital signs reviewed and stable  Post vital signs: Reviewed and stable  Last Vitals:  Vitals Value Taken Time  BP 135/74 08/28/23 0950  Temp    Pulse 74 08/28/23 0954  Resp 15 08/28/23 0954  SpO2 99 % 08/28/23 0954  Vitals shown include unfiled device data.  Last Pain:  Vitals:   08/28/23 9367  TempSrc: Oral  PainSc: 10-Worst pain ever         Complications: No notable events documented.

## 2023-08-30 ENCOUNTER — Encounter: Payer: Self-pay | Admitting: Neurosurgery

## 2023-09-07 ENCOUNTER — Ambulatory Visit: Payer: Medicare PPO | Admitting: Cardiology

## 2023-09-10 ENCOUNTER — Encounter: Payer: Medicare PPO | Admitting: Physician Assistant

## 2023-09-10 ENCOUNTER — Ambulatory Visit (INDEPENDENT_AMBULATORY_CARE_PROVIDER_SITE_OTHER): Payer: Medicare PPO | Admitting: Neurosurgery

## 2023-09-10 ENCOUNTER — Encounter: Payer: Self-pay | Admitting: Neurosurgery

## 2023-09-10 VITALS — BP 122/68 | Temp 98.5°F

## 2023-09-10 DIAGNOSIS — G894 Chronic pain syndrome: Secondary | ICD-10-CM

## 2023-09-10 DIAGNOSIS — Z09 Encounter for follow-up examination after completed treatment for conditions other than malignant neoplasm: Secondary | ICD-10-CM

## 2023-09-10 DIAGNOSIS — G546 Phantom limb syndrome with pain: Secondary | ICD-10-CM

## 2023-09-10 NOTE — Progress Notes (Signed)
Referring Physician:  Orson Eva, NP [address] [place],  [place] [address]  Primary Physician:  Marisue Ivan, NP  History of Present Illness: 09/10/2023 Sara [name] is here today with a chief complaint of right lower extremity phantom limb pain.  She had a thoracic spinal cord stimulator placed on 08/28/2023.  Overall she is doing very well.  She has not had any wound issues.  She states that she has 95 to 100% relief of her lower extremity pain.  Overall she is very happy with the procedure.   Review of Systems:  A 10 point review of systems is negative, except for the pertinent positives and negatives detailed in the HPI.  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 Gilda Crease   Vertigo    Last episode approx Aug 2015   Wears dentures    full upper    Past Surgical History: Past Surgical History:  Procedure Laterality Date   ADENOIDECTOMY     AMPUTATION Right 08/11/2020   Procedure: AMPUTATION ABOVE KNEE;  Surgeon: Renford Dills, MD;  Location: [facility];  Service: Vascular;  Laterality: Right;   ARTERY BIOPSY Right 07/14/2019   Procedure: BIOPSY TEMPORAL ARTERY;  Surgeon: Renford Dills, MD;  Location: [facility];  Service: Vascular;  Laterality: Right;   BREAST CYST ASPIRATION Left    CARDIAC CATHETERIZATION  02/15/2017   [facility]   COLONOSCOPY WITH PROPOFOL N/A 10/22/2017   Procedure: COLONOSCOPY WITH PROPOFOL;  Surgeon: Midge Minium, MD;  Location: [facility];  Service: Endoscopy;   Laterality: N/A;  specimens not taken--pt on Plavix will be brought back in after 7 days off med   COLONOSCOPY WITH PROPOFOL N/A 11/05/2017   Procedure: COLONOSCOPY WITH PROPOFOL;  Surgeon: Midge Minium, MD;  Location: [facility];  Service: Endoscopy;  Laterality: N/A;   COLONOSCOPY WITH PROPOFOL N/A 11/13/2022   Procedure: COLONOSCOPY WITH PROPOFOL;  Surgeon: Midge Minium, MD;  Location: [facility];  Service: Endoscopy;  Laterality: N/A;   ESOPHAGOGASTRODUODENOSCOPY N/A 12/21/2014   Procedure: ESOPHAGOGASTRODUODENOSCOPY (EGD);  Surgeon: Midge Minium, MD;  Location: [facility];  Service: Gastroenterology;  Laterality: N/A;   ESOPHAGOGASTRODUODENOSCOPY (EGD) WITH PROPOFOL N/A 02/08/2016   Procedure: ESOPHAGOGASTRODUODENOSCOPY (EGD) WITH PROPOFOL;  Surgeon: Midge Minium, MD;  Location: [facility];  Service: Endoscopy;  Laterality: N/A;   FASCIOTOMY Right 05/30/2019   Procedure: FASCIOTOMY;  Surgeon: Renford Dills, MD;  Location: [facility];  Service: Vascular;  Laterality: Right;   FASCIOTOMY CLOSURE Right 06/04/2019   Procedure: FASCIOTOMY CLOSURE;  Surgeon: Renford Dills, MD;  Location: [facility];  Service: Vascular;  Laterality: Right;   HEMORROIDECTOMY  2014   LOWER EXTREMITY ANGIOGRAPHY Right 05/30/2019   Procedure: LOWER EXTREMITY ANGIOGRAPHY;  Surgeon: Renford Dills, MD;  Location: [facility];  Service: Cardiovascular;  Laterality: Right;   LOWER EXTREMITY ANGIOGRAPHY Right 07/02/2020   Procedure: LOWER EXTREMITY ANGIOGRAPHY;  Surgeon: Renford Dills, MD;  Location: [facility];  Service: Cardiovascular;  Laterality: Right;   PERIPHERAL VASCULAR CATHETERIZATION N/A 02/09/2016   Procedure: Abdominal Aortogram w/Lower Extremity;  Surgeon: Renford Dills, MD;  Location: [facility];  Service: Cardiovascular;  Laterality: N/A;   PERIPHERAL VASCULAR CATHETERIZATION Right 02/10/2016   Procedure: Lower Extremity  Angiography;  Surgeon: Renford Dills, MD;  Location: [facility];  Service: Cardiovascular;  Laterality: Right;   POLYPECTOMY  11/05/2017   Procedure: POLYPECTOMY;  Surgeon: Midge Minium, MD;  Location: [facility];  Service: Endoscopy;;   POLYPECTOMY  11/13/2022   Procedure: POLYPECTOMY;  Surgeon: Midge Minium, MD;  Location: [facility];  Service: Endoscopy;;   Sarcoidos     bone   SHOULDER ARTHROSCOPY WITH ROTATOR CUFF REPAIR AND SUBACROMIAL DECOMPRESSION Right 02/27/2020   Procedure: RIGHT SHOULDER ARTHROSCOPY SUBACROMIAL DECOMPRESSION, DISTAL CLAVICLE EXCISION AND MINI-OPEN ROTATOR CUFF REPAIR;  Surgeon: Juanell Fairly, MD;  Location: [facility];  Service: Orthopedics;  Laterality: Right;   THORACIC LAMINECTOMY FOR SPINAL CORD STIMULATOR N/A 08/28/2023   Procedure: THORACIC LAMINECTOMY FOR SPINAL CORD STIMULATOR PLACEMENT (MEDTRONIC);  Surgeon: Lovenia Kim, MD;  Location: [facility];  Service: Neurosurgery;  Laterality: N/A;   TONSILLECTOMY     VASCULAR SURGERY  [id number]   Fem-Pop Bypass    Allergies: Allergies as of 09/10/2023 - Review Complete 09/10/2023  Allergen Reaction Noted   Hydrocodone-acetaminophen  05/29/2019   Diltiazem Other (See Comments) 10/04/2022    Medications:  Current Outpatient Medications:    apixaban (ELIQUIS) 2.5 MG TABS tablet, Please do not continue until 09/10/22, Disp: 60 tablet, Rfl: 0   atorvastatin (LIPITOR) 40 MG tablet, Take 40 mg by mouth at bedtime. , Disp: , Rfl:    Blood Pressure Monitoring (BLOOD PRESSURE CUFF) MISC, 1 Units by Does not apply route daily as needed., Disp: 1 each, Rfl: 0   Calcium Carb-Cholecalciferol 600-10 MG-MCG TABS, Take 1 tablet by mouth 2 (two) times daily., Disp: , Rfl:    digoxin (LANOXIN) 0.125 MG tablet, Take 0.125 mg by mouth daily., Disp: , Rfl:    docusate sodium (COLACE) 100 MG capsule, Take 1 capsule (100 mg total) by mouth 2 (two) times daily., Disp: 10 capsule, Rfl: 0    DULoxetine (CYMBALTA) 60 MG capsule, TAKE 1 CAPSULE BY MOUTH TWICE A DAY, Disp: 180 capsule, Rfl: 3   ivabradine (CORLANOR) 7.5 MG TABS tablet, Take 1 tablet (7.5 mg total) by mouth 2 (two) times daily with a meal., Disp: 60 tablet, Rfl: 3   losartan (COZAAR) 25 MG tablet, Take 25 mg by mouth every morning. , Disp: , Rfl:    omeprazole (PRILOSEC) 40 MG capsule, Take 40 mg by mouth daily as needed (acid reflux)., Disp: , Rfl:    senna (SENOKOT) 8.6 MG TABS tablet, Take 1 tablet (8.6 mg total) by mouth daily., Disp: 120 tablet, Rfl: 0   traMADol (ULTRAM) 50 MG tablet, Take 1-2 tablets (50-100 mg total) by mouth every 6 (six) hours as needed., Disp: 30 tablet, Rfl: 0   triamcinolone ointment (KENALOG) 0.5 %, Apply 1 Application topically 2 (two) times daily., Disp: 30 g, Rfl: 3   zolpidem (AMBIEN) 10 MG tablet, TAKE 1 TABLET BY MOUTH NIGHTLY AT BEDTIME AS NEEDED FOR INSOMNIA (Patient taking differently: Take 10 mg by mouth at bedtime.), Disp: 30 tablet, Rfl: 1   gabapentin (NEURONTIN) 800 MG tablet, Take 1 tablet (800 mg total) by mouth 3 (three) times daily.,  Disp: 90 tablet, Rfl: 0   lidocaine (LIDODERM) 5 %, Place 1 patch onto the skin daily. (Patient not taking: Reported on 08/08/2023), Disp: , Rfl:   Social History: Social History   Tobacco Use   Smoking status: Former    Current packs/day: 0.00    Average packs/day: 2.0 packs/day for 25.0 years (50.0 ttl pk-yrs)    Types: Cigarettes    Start date: 08/21/1966    Quit date: 08/22/1991    Years since quitting: 32.0   Smokeless tobacco: Never  Vaping Use   Vaping status: Never Used  Substance Use Topics   Alcohol use: No    Alcohol/week: 0.0 standard drinks of alcohol   Drug use: Never    Family Medical History: Family History  Problem Relation Age of Onset   CVA Mother    Heart attack Mother    Cancer Father        colon cancer   Colon cancer Father    Heart disease Maternal Uncle    Breast cancer Neg Hx     Physical  Examination: Vitals:   09/10/23 1032  BP: 122/68  Temp: 98.5 F (36.9 C)    General: Patient is in no apparent distress. Attention to examination is appropriate.  Neck:   Supple.  Full range of motion.  Respiratory: Patient is breathing without any difficulty.   NEUROLOGICAL:     Awake, alert, oriented to person, place, and time.  Speech is clear and fluent.   Cranial Nerves: Pupils equal round and reactive to light.  Facial tone is symmetric.  Facial sensation is symmetric. Shoulder shrug is symmetric. Tongue protrusion is midline.    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  Reflexes are patellar reflex is 2+ on the left.  Bilateral upper and lower extremity sensation is intact to light touch       Imaging: Narrative & Impression  CLINICAL DATA:  Follow-up PET scan and abnormal outside thoracic spine MRI. Suspicion for multiple myeloma.   EXAM: MRI LUMBAR SPINE WITHOUT AND WITH CONTRAST   TECHNIQUE: Multiplanar and multiecho pulse sequences of the lumbar spine were obtained without and with intravenous contrast.   CONTRAST:  5mL GADAVIST GADOBUTROL 1 MMOL/ML IV SOLN   COMPARISON:  PET-CT 05/16/2022 and prior lumbar spine radiographs 02/23/2022   FINDINGS: Segmentation: There are five lumbar type vertebral bodies. The last full intervertebral disc space is labeled L5-S1.   Alignment:  Normal   Vertebrae: There are numerous small lesions scattered throughout the lower thoracic and lumbar vertebral bodies and also in the sacrum. Most of these demonstrate increased STIR signal intensity and low T1 signal intensity and show contrast enhancement. Findings highly suspicious for multiple myeloma.   No acute fractures are identified. Remote compression fractures of L1 and L3.   Conus medullaris and cauda equina: Conus extends to the L1 level. Conus and cauda  equina appear normal.   Paraspinal and other soft tissues: No significant paraspinal or retroperitoneal findings. No aortic aneurysm or retroperitoneal adenopathy.   Disc levels:   T12-L1: No significant findings.   L1-2: Mild annular bulge but the spinal canal is generous. No spinal or foraminal stenosis.   L2-3: Bulging degenerated disc, mild retropulsion of the posterosuperior aspect of L3 and facet disease contributing to mild spinal and bilateral lateral recess stenosis. No foraminal stenosis.   L3-4: No focal disc protrusions, spinal or foraminal stenosis.  Minimal right lateral recess encroachment.   L4-5: Moderate facet disease and ligamentum flavum thickening but no disc protrusions, spinal or foraminal stenosis.   L5-S1: Mild facet disease. No spinal or foraminal stenosis.   IMPRESSION: 1. Numerous small enhancing lesions scattered throughout the lower thoracic and lumbar vertebral bodies and also in the sacrum highly suspicious for multiple myeloma. No acute fractures are identified. 2. Remote compression fractures of L1 and L3. 3. Mild spinal and bilateral lateral recess stenosis at L2-3. 4. Minimal right lateral recess encroachment at L3-4.     Electronically Signed   By: Rudie Meyer M.D.   On: 06/25/2022 12:07     Review of the MRI of her thoracic spine demonstrates adequate place/space for a paddle   I have personally reviewed the images and agree with the above interpretation.  Medical Decision Making/Assessment and Plan: Sara [name] is a pleasant 73 y.o. 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  She has had significant stump and nerve pain ever since that time.  She has been following with our pain team and has had a previous spinal cord stimulator trial with percutaneous leads which resulted in 95 to 100% relief of her phantom limb pain.  Given her positive response she underwent a permanent  paddle placement on 08/28/2023.  We did discuss that we had to use 2 incisions on the thoracic spine as she developed a new compression fracture which caused [place] to place the stimulator slightly low, in order to get this into the optimum position we made another smaller incision above to get it into optimal positioning.  We did discuss those with the patient again as we did postoperatively.  Overall she is happy with her outcome as she is having very good results with her pain control.  She will have her programming today with the stimulator team.  Thank you for involving me in the care of this patient.    Lovenia Kim MD/MSCR Neurosurgery

## 2023-09-19 ENCOUNTER — Other Ambulatory Visit: Payer: Medicare PPO

## 2023-09-19 DIAGNOSIS — R7303 Prediabetes: Secondary | ICD-10-CM | POA: Diagnosis not present

## 2023-09-19 DIAGNOSIS — I10 Essential (primary) hypertension: Secondary | ICD-10-CM

## 2023-09-19 DIAGNOSIS — Z1329 Encounter for screening for other suspected endocrine disorder: Secondary | ICD-10-CM | POA: Diagnosis not present

## 2023-09-19 DIAGNOSIS — E782 Mixed hyperlipidemia: Secondary | ICD-10-CM | POA: Diagnosis not present

## 2023-09-20 ENCOUNTER — Ambulatory Visit (INDEPENDENT_AMBULATORY_CARE_PROVIDER_SITE_OTHER): Payer: Medicare PPO | Admitting: Cardiology

## 2023-09-20 ENCOUNTER — Encounter: Payer: Self-pay | Admitting: Cardiology

## 2023-09-20 VITALS — BP 112/64 | HR 91 | Ht 63.0 in | Wt 130.0 lb

## 2023-09-20 DIAGNOSIS — E782 Mixed hyperlipidemia: Secondary | ICD-10-CM | POA: Diagnosis not present

## 2023-09-20 DIAGNOSIS — Z0130 Encounter for examination of blood pressure without abnormal findings: Secondary | ICD-10-CM

## 2023-09-20 DIAGNOSIS — E0849 Diabetes mellitus due to underlying condition with other diabetic neurological complication: Secondary | ICD-10-CM

## 2023-09-20 LAB — CMP14+EGFR
ALT: 37 [IU]/L — ABNORMAL HIGH (ref 0–32)
AST: 42 [IU]/L — ABNORMAL HIGH (ref 0–40)
Albumin: 4.4 g/dL (ref 3.9–4.9)
Alkaline Phosphatase: 84 [IU]/L (ref 44–121)
BUN/Creatinine Ratio: 17 (ref 12–28)
BUN: 14 mg/dL (ref 8–27)
Bilirubin Total: 0.4 mg/dL (ref 0.0–1.2)
CO2: 21 mmol/L (ref 20–29)
Calcium: 9.4 mg/dL (ref 8.7–10.3)
Chloride: 103 mmol/L (ref 96–106)
Creatinine, Ser: 0.84 mg/dL (ref 0.57–1.00)
Globulin, Total: 2.5 g/dL (ref 1.5–4.5)
Glucose: 105 mg/dL — ABNORMAL HIGH (ref 70–99)
Potassium: 5.1 mmol/L (ref 3.5–5.2)
Sodium: 140 mmol/L (ref 134–144)
Total Protein: 6.9 g/dL (ref 6.0–8.5)
eGFR: 75 mL/min/{1.73_m2} (ref >59)

## 2023-09-20 LAB — LIPID PANEL
Chol/HDL Ratio: 2 {ratio} (ref 0.0–4.4)
Cholesterol, Total: 110 mg/dL (ref 100–199)
HDL: 54 mg/dL (ref >39)
LDL Chol Calc (NIH): 40 mg/dL (ref 0–99)
Triglycerides: 83 mg/dL (ref 0–149)
VLDL Cholesterol Cal: 16 mg/dL (ref 5–40)

## 2023-09-20 LAB — HEMOGLOBIN A1C
Est. average glucose Bld gHb Est-mCnc: 131 mg/dL
Hgb A1c MFr Bld: 6.2 % — ABNORMAL HIGH (ref 4.8–5.6)

## 2023-09-20 LAB — TSH: TSH: 1.09 u[IU]/mL (ref 0.450–4.500)

## 2023-09-20 NOTE — Progress Notes (Signed)
Established Patient Office Visit  Subjective:  Patient ID: Sara [name], female    DOB: 04-Sep-1950  Age: 73 y.o. MRN: [id number]  Chief Complaint  Patient presents with   Follow-up    4 Months Follow Up    Patient in office for 4 month follow up, discuss recent lab results. Patient doing well, no new complaints today. Discuss recent lab results, all stable. Scheduled for DEE in April 2025. Continue same medications. Keep appointments with specialist.     No other concerns at this time.   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 Gilda Crease   Vertigo    Last episode approx Aug 2015   Wears dentures    full upper    Past Surgical History:  Procedure Laterality Date   ADENOIDECTOMY     AMPUTATION Right 08/11/2020   Procedure: AMPUTATION ABOVE KNEE;  Surgeon: Renford Dills, MD;  Location: [facility];  Service: Vascular;  Laterality: Right;   ARTERY BIOPSY Right 07/14/2019   Procedure: BIOPSY TEMPORAL ARTERY;  Surgeon: Renford Dills, MD;  Location: [facility];  Service: Vascular;  Laterality: Right;   BREAST CYST ASPIRATION Left    CARDIAC CATHETERIZATION  02/15/2017   [facility]   COLONOSCOPY WITH PROPOFOL N/A 10/22/2017   Procedure: COLONOSCOPY WITH PROPOFOL;  Surgeon: Midge Minium, MD;  Location: [facility];  Service: Endoscopy;  Laterality: N/A;  specimens not taken--pt on Plavix will be brought back in after 7 days off med   COLONOSCOPY WITH PROPOFOL N/A 11/05/2017   Procedure: COLONOSCOPY WITH PROPOFOL;  Surgeon: Midge Minium, MD;  Location: [facility];  Service: Endoscopy;  Laterality: N/A;   COLONOSCOPY WITH PROPOFOL N/A 11/13/2022   Procedure: COLONOSCOPY WITH PROPOFOL;  Surgeon: Midge Minium, MD;  Location: [facility];  Service: Endoscopy;  Laterality: N/A;   ESOPHAGOGASTRODUODENOSCOPY N/A 12/21/2014   Procedure: ESOPHAGOGASTRODUODENOSCOPY (EGD);  Surgeon: Midge Minium, MD;  Location: [facility];  Service: Gastroenterology;  Laterality: N/A;   ESOPHAGOGASTRODUODENOSCOPY (EGD) WITH PROPOFOL N/A 02/08/2016   Procedure: ESOPHAGOGASTRODUODENOSCOPY (EGD) WITH PROPOFOL;  Surgeon: Midge Minium, MD;  Location: [facility];  Service: Endoscopy;  Laterality: N/A;   FASCIOTOMY Right 05/30/2019   Procedure: FASCIOTOMY;  Surgeon: Renford Dills, MD;  Location: [facility];  Service: Vascular;  Laterality: Right;   FASCIOTOMY CLOSURE Right 06/04/2019   Procedure: FASCIOTOMY CLOSURE;  Surgeon: Renford Dills, MD;  Location: [facility];  Service: Vascular;  Laterality: Right;   HEMORROIDECTOMY  2014   LOWER EXTREMITY ANGIOGRAPHY Right 05/30/2019   Procedure: LOWER EXTREMITY ANGIOGRAPHY;  Surgeon: Renford Dills, MD;  Location: [facility];  Service: Cardiovascular;  Laterality: Right;   LOWER EXTREMITY ANGIOGRAPHY Right 07/02/2020   Procedure: LOWER EXTREMITY ANGIOGRAPHY;  Surgeon: Renford Dills, MD;  Location: [facility];  Service: Cardiovascular;  Laterality: Right;   PERIPHERAL VASCULAR CATHETERIZATION N/A 02/09/2016   Procedure: Abdominal Aortogram w/Lower Extremity;  Surgeon: Renford Dills, MD;  Location: [facility];  Service: Cardiovascular;  Laterality: N/A;   PERIPHERAL VASCULAR CATHETERIZATION Right 02/10/2016   Procedure: Lower Extremity Angiography;  Surgeon: Renford Dills, MD;  Location: [facility];  Service: Cardiovascular;  Laterality: Right;   POLYPECTOMY  11/05/2017   Procedure: POLYPECTOMY;  Surgeon: Midge Minium, MD;   Location: [facility];  Service: Endoscopy;;   POLYPECTOMY  11/13/2022   Procedure: POLYPECTOMY;  Surgeon: Midge Minium, MD;  Location: [facility];  Service: Endoscopy;;   Sarcoidos     bone   SHOULDER ARTHROSCOPY WITH ROTATOR CUFF REPAIR AND SUBACROMIAL DECOMPRESSION Right 02/27/2020   Procedure: RIGHT SHOULDER ARTHROSCOPY SUBACROMIAL DECOMPRESSION, DISTAL CLAVICLE EXCISION AND MINI-OPEN ROTATOR CUFF REPAIR;  Surgeon: Juanell Fairly, MD;  Location: [facility];  Service: Orthopedics;  Laterality: Right;   THORACIC LAMINECTOMY FOR SPINAL CORD STIMULATOR N/A 08/28/2023   Procedure: THORACIC LAMINECTOMY FOR SPINAL CORD STIMULATOR PLACEMENT (MEDTRONIC);  Surgeon: Lovenia Kim, MD;  Location: [facility];  Service: Neurosurgery;  Laterality: N/A;   TONSILLECTOMY     VASCULAR SURGERY  [id number]   Fem-Pop Bypass    Social History   Socioeconomic History   Marital status: Single    Spouse name: Not on file   Number of children: Not on file   Years of education: Not on file   Highest education level: Not on file  Occupational History   Not on file  Tobacco Use   Smoking status: Former    Current packs/day: 0.00    Average packs/day: 2.0 packs/day for 25.0 years (50.0 ttl pk-yrs)    Types: Cigarettes    Start date: 08/21/1966    Quit date: 08/22/1991    Years since quitting: 32.1   Smokeless tobacco: Never  Vaping Use   Vaping status: Never Used  Substance and Sexual Activity   Alcohol use: No    Alcohol/week: 0.0 standard drinks of alcohol   Drug use: Never   Sexual activity: Not Currently  Other Topics Concern   Not on file  Social History Narrative   Not on file   Social Drivers of Health   Financial Resource Strain: Patient Declined (07/16/2023)   Received from [facility]   Overall Financial Resource Strain (CARDIA)    Difficulty of Paying Living Expenses: Patient declined  Food Insecurity: Patient Declined (07/16/2023)   Received from  [facility]   Hunger Vital Sign    Worried About Running Out of Food in the Last Year: Patient declined    Ran Out of Food in the Last Year: Patient declined  Transportation Needs: Patient Declined (07/16/2023)   Received from [facility]   PRAPARE - Transportation    In the past 12 months, has lack of transportation kept you from medical appointments or from getting medications?: Patient declined    Lack of Transportation (Non-Medical): Patient declined  Physical Activity: Not on file  Stress: Not on file  Social Connections: Not on file  Intimate Partner Violence: Not on file    Family History  Problem Relation Age of Onset   CVA Mother    Heart attack Mother    Cancer Father        colon cancer   Colon cancer Father    Heart disease Maternal Uncle    Breast cancer Neg Hx     Allergies  Allergen Reactions   Hydrocodone-Acetaminophen     Other reaction(s): Flushing  Diltiazem Other (See Comments)    headache    Outpatient Medications Prior to Visit  Medication Sig   apixaban (ELIQUIS) 2.5 MG TABS tablet Please do not continue until 09/10/22   atorvastatin (LIPITOR) 40 MG tablet Take 40 mg by mouth at bedtime.    Blood Pressure Monitoring (BLOOD PRESSURE CUFF) MISC 1 Units by Does not apply route daily as needed.   Calcium Carb-Cholecalciferol 600-10 MG-MCG TABS Take 1 tablet by mouth 2 (two) times daily.   digoxin (LANOXIN) 0.125 MG tablet Take 0.125 mg by mouth daily.   docusate sodium (COLACE) 100 MG capsule Take 1 capsule (100 mg total) by mouth 2 (two) times daily.   DULoxetine (CYMBALTA) 60 MG capsule TAKE 1 CAPSULE BY MOUTH TWICE A DAY   gabapentin (NEURONTIN) 800 MG tablet Take 1 tablet (800 mg total) by mouth 3 (three) times daily.   ivabradine (CORLANOR) 7.5 MG TABS tablet Take 1 tablet (7.5 mg total) by mouth 2 (two) times daily with a meal.   lidocaine (LIDODERM) 5 % Place 1 patch onto the skin daily. (Patient not taking:  Reported on 08/08/2023)   losartan (COZAAR) 25 MG tablet Take 25 mg by mouth every morning.    omeprazole (PRILOSEC) 40 MG capsule Take 40 mg by mouth daily as needed (acid reflux).   senna (SENOKOT) 8.6 MG TABS tablet Take 1 tablet (8.6 mg total) by mouth daily.   traMADol (ULTRAM) 50 MG tablet Take 1-2 tablets (50-100 mg total) by mouth every 6 (six) hours as needed.   triamcinolone ointment (KENALOG) 0.5 % Apply 1 Application topically 2 (two) times daily.   zolpidem (AMBIEN) 10 MG tablet TAKE 1 TABLET BY MOUTH NIGHTLY AT BEDTIME AS NEEDED FOR INSOMNIA (Patient taking differently: Take 10 mg by mouth at bedtime.)   No facility-administered medications prior to visit.    Review of Systems  Constitutional: Negative.   HENT: Negative.    Eyes: Negative.   Respiratory: Negative.  Negative for shortness of breath.   Cardiovascular: Negative.  Negative for chest pain.  Gastrointestinal: Negative.  Negative for abdominal pain, constipation and diarrhea.  Genitourinary: Negative.   Musculoskeletal:  Negative for joint pain and myalgias.  Skin: Negative.   Neurological: Negative.  Negative for dizziness and headaches.  Endo/Heme/Allergies: Negative.   Psychiatric/Behavioral:  Positive for memory loss.   All other systems reviewed and are negative.      Objective:   BP 112/64   Pulse 91   Ht 5\' 3"  (1.6 m)   Wt 130 lb (59 kg)   SpO2 95%   BMI 23.03 kg/m   Vitals:   09/20/23 1054  BP: 112/64  Pulse: 91  Height: 5\' 3"  (1.6 m)  Weight: 130 lb (59 kg)  SpO2: 95%  BMI (Calculated): 23.03    Physical Exam Vitals and nursing note reviewed.  Constitutional:      Appearance: Normal appearance. She is normal weight.  HENT:     Head: Normocephalic and atraumatic.     Nose: Nose normal.     Mouth/Throat:     Mouth: Mucous membranes are moist.  Eyes:     Extraocular Movements: Extraocular movements intact.     Conjunctiva/sclera: Conjunctivae normal.     Pupils: Pupils are  equal, round, and reactive to light.  Cardiovascular:     Rate and Rhythm: Normal rate and regular rhythm.     Pulses: Normal pulses.     Heart sounds: Normal heart sounds.  Pulmonary:  Effort: Pulmonary effort is normal.     Breath sounds: Normal breath sounds.  Abdominal:     General: Abdomen is flat. Bowel sounds are normal.     Palpations: Abdomen is soft.  Musculoskeletal:        General: Normal range of motion.     Cervical back: Normal range of motion.  Skin:    General: Skin is warm and dry.  Neurological:     General: No focal deficit present.     Mental Status: She is alert and oriented to person, place, and time.  Psychiatric:        Mood and Affect: Mood normal.        Behavior: Behavior normal.        Thought Content: Thought content normal.        Judgment: Judgment normal.      No results found for any visits on 09/20/23.  Recent Results (from the past 2160 hours)  CBC per protocol     Status: Abnormal   Collection Time: 08/13/23 10:10 AM  Result Value Ref Range   WBC 5.9 4.0 - 10.5 K/uL   RBC 4.97 3.87 - 5.11 MIL/uL   Hemoglobin 15.9 (H) 12.0 - 15.0 g/dL   HCT 16.1 (H) 09.6 - 04.5 %   MCV 95.6 80.0 - 100.0 fL   MCH 32.0 26.0 - 34.0 pg   MCHC 33.5 30.0 - 36.0 g/dL   RDW 40.9 81.1 - 91.4 %   Platelets 155 150 - 400 K/uL   nRBC 0.0 0.0 - 0.2 %    Comment: Performed at [facility], [address] [place], [place] [address]  Basic metabolic panel per protocol     Status: Abnormal   Collection Time: 08/13/23 10:10 AM  Result Value Ref Range   Sodium 136 135 - 145 mmol/L   Potassium 4.5 3.5 - 5.1 mmol/L   Chloride 100 98 - 111 mmol/L   CO2 25 22 - 32 mmol/L   Glucose, Bld 117 (H) 70 - 99 mg/dL    Comment: Glucose reference range applies only to samples taken after fasting for at least 8 hours.   BUN 10 8 - 23 mg/dL   Creatinine, Ser 6.21 0.44 - 1.00 mg/dL   Calcium 9.4 8.9 - 30.8 mg/dL   GFR, Estimated >65 >78 mL/min    Comment:  (NOTE) Calculated using the CKD-EPI Creatinine Equation (2021)    Anion gap 11 5 - 15    Comment: Performed at [facility], [address] [place], [place] [address]  Surgical pcr screen     Status: None   Collection Time: 08/17/23  1:53 PM   Specimen: Nasal Mucosa; Nasal Swab  Result Value Ref Range   MRSA, PCR NEGATIVE NEGATIVE   Staphylococcus aureus NEGATIVE NEGATIVE    Comment: (NOTE) The Xpert SA Assay (FDA approved for NASAL specimens in patients 21 years of age and older), is one component of a comprehensive surveillance program. It is not intended to diagnose infection nor to guide or monitor treatment. Performed at [facility], [address] [place], [place] [address]   Type and screen [facility]     Status: None   Collection Time: 08/28/23  6:43 AM  Result Value Ref Range   ABO/RH(D) O POS    Antibody Screen NEG    Sample Expiration      08/31/2023,2359 Performed at [facility], [address], [place], [place] [address]  Hemoglobin A1c     Status: Abnormal   Collection Time: 09/19/23  2:12 PM  Result Value Ref Range   Hgb A1c MFr Bld 6.2 (H) 4.8 - 5.6 %    Comment:          Prediabetes: 5.7 - 6.4          Diabetes: >6.4          Glycemic control for adults with diabetes: <7.0    Est. average glucose Bld gHb Est-mCnc 131 mg/dL  Lipid panel     Status: None   Collection Time: 09/19/23  2:12 PM  Result Value Ref Range   Cholesterol, Total 110 100 - 199 mg/dL   Triglycerides 83 0 - 149 mg/dL   HDL 54 >72 mg/dL   VLDL Cholesterol Cal 16 5 - 40 mg/dL   LDL Chol Calc (NIH) 40 0 - 99 mg/dL   Chol/HDL Ratio 2.0 0.0 - 4.4 ratio    Comment:                                   T. Chol/HDL Ratio                                             Men  Women                               1/2 Avg.Risk  3.4    3.3                                   Avg.Risk  5.0    4.4                                2X Avg.Risk  9.6     7.1                                3X Avg.Risk 23.4   11.0   TSH     Status: None   Collection Time: 09/19/23  2:12 PM  Result Value Ref Range   TSH 1.090 0.450 - 4.500 uIU/mL  CMP14+EGFR     Status: Abnormal   Collection Time: 09/19/23  2:12 PM  Result Value Ref Range   Glucose 105 (H) 70 - 99 mg/dL   BUN 14 8 - 27 mg/dL   Creatinine, Ser 0.94 0.57 - 1.00 mg/dL   eGFR 75 >70 JG/GEZ/6.62   BUN/Creatinine Ratio 17 12 - 28   Sodium 140 134 - 144 mmol/L   Potassium 5.1 3.5 - 5.2 mmol/L   Chloride 103 96 - 106 mmol/L   CO2 21 20 - 29 mmol/L   Calcium 9.4 8.7 - 10.3 mg/dL   Total Protein 6.9 6.0 - 8.5 g/dL   Albumin 4.4 3.9 - 4.9 g/dL   Globulin, Total 2.5 1.5 - 4.5 g/dL   Bilirubin Total 0.4 0.0 - 1.2 mg/dL   Alkaline Phosphatase 84 44 - 121 IU/L   AST 42 (H) 0 - 40 IU/L   ALT 37 (H) 0 - 32 IU/L  Assessment & Plan:  Continues same medications.   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   Total time spent: 25 minutes  Google, NP  09/20/2023   This document may have been prepared by Dragon Voice Recognition software and as such may include unintentional dictation errors.

## 2023-10-02 DIAGNOSIS — D869 Sarcoidosis, unspecified: Secondary | ICD-10-CM | POA: Diagnosis not present

## 2023-10-02 DIAGNOSIS — Z79899 Other long term (current) drug therapy: Secondary | ICD-10-CM | POA: Diagnosis not present

## 2023-10-02 DIAGNOSIS — M316 Other giant cell arteritis: Secondary | ICD-10-CM | POA: Diagnosis not present

## 2023-10-08 ENCOUNTER — Ambulatory Visit (INDEPENDENT_AMBULATORY_CARE_PROVIDER_SITE_OTHER): Payer: Medicare PPO | Admitting: Neurosurgery

## 2023-10-08 VITALS — BP 122/68 | Temp 97.8°F | Ht 63.0 in | Wt 130.0 lb

## 2023-10-08 DIAGNOSIS — Z9689 Presence of other specified functional implants: Secondary | ICD-10-CM | POA: Insufficient documentation

## 2023-10-08 DIAGNOSIS — G894 Chronic pain syndrome: Secondary | ICD-10-CM

## 2023-10-08 DIAGNOSIS — Z09 Encounter for follow-up examination after completed treatment for conditions other than malignant neoplasm: Secondary | ICD-10-CM

## 2023-10-08 DIAGNOSIS — G546 Phantom limb syndrome with pain: Secondary | ICD-10-CM

## 2023-10-08 NOTE — Progress Notes (Signed)
Referring Physician:  Orson Eva, NP [address] [place],  [place] [address]  Primary Physician:  Marisue Ivan, NP  History of Present Illness: 10/08/2023 [name] is here today with a chief complaint of right lower extremity phantom limb pain.  She had a thoracic spinal cord stimulator placed on 08/28/2023.  Overall she is doing very well.  She states that she is able to sleep through the night for the first time in years.   Review of Systems:  A 10 point review of systems is negative, except for the pertinent positives and negatives detailed in the HPI.  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 Gilda Crease   Vertigo    Last episode approx Aug 2015   Wears dentures    full upper    Past Surgical History: Past Surgical History:  Procedure Laterality Date   ADENOIDECTOMY     AMPUTATION Right 08/11/2020   Procedure: AMPUTATION ABOVE KNEE;  Surgeon: Renford Dills, MD;  Location: [facility];  Service: Vascular;  Laterality: Right;   ARTERY BIOPSY Right 07/14/2019   Procedure: BIOPSY TEMPORAL ARTERY;  Surgeon: Renford Dills, MD;  Location: [facility];  Service: Vascular;  Laterality: Right;   BREAST CYST ASPIRATION Left    CARDIAC CATHETERIZATION  02/15/2017   [facility]   COLONOSCOPY WITH PROPOFOL N/A 10/22/2017   Procedure: COLONOSCOPY WITH PROPOFOL;  Surgeon: Midge Minium, MD;  Location: [facility];  Service: Endoscopy;  Laterality: N/A;  specimens not taken--pt on Plavix will be brought back in  after 7 days off med   COLONOSCOPY WITH PROPOFOL N/A 11/05/2017   Procedure: COLONOSCOPY WITH PROPOFOL;  Surgeon: Midge Minium, MD;  Location: [facility];  Service: Endoscopy;  Laterality: N/A;   COLONOSCOPY WITH PROPOFOL N/A 11/13/2022   Procedure: COLONOSCOPY WITH PROPOFOL;  Surgeon: Midge Minium, MD;  Location: [facility];  Service: Endoscopy;  Laterality: N/A;   ESOPHAGOGASTRODUODENOSCOPY N/A 12/21/2014   Procedure: ESOPHAGOGASTRODUODENOSCOPY (EGD);  Surgeon: Midge Minium, MD;  Location: [facility];  Service: Gastroenterology;  Laterality: N/A;   ESOPHAGOGASTRODUODENOSCOPY (EGD) WITH PROPOFOL N/A 02/08/2016   Procedure: ESOPHAGOGASTRODUODENOSCOPY (EGD) WITH PROPOFOL;  Surgeon: Midge Minium, MD;  Location: [facility];  Service: Endoscopy;  Laterality: N/A;   FASCIOTOMY Right 05/30/2019   Procedure: FASCIOTOMY;  Surgeon: Renford Dills, MD;  Location: [facility];  Service: Vascular;  Laterality: Right;   FASCIOTOMY CLOSURE Right 06/04/2019   Procedure: FASCIOTOMY CLOSURE;  Surgeon: Renford Dills, MD;  Location: [facility];  Service: Vascular;  Laterality: Right;   HEMORROIDECTOMY  2014   LOWER EXTREMITY ANGIOGRAPHY Right 05/30/2019   Procedure: LOWER EXTREMITY ANGIOGRAPHY;  Surgeon: Renford Dills, MD;  Location: [facility];  Service: Cardiovascular;  Laterality: Right;   LOWER EXTREMITY ANGIOGRAPHY Right 07/02/2020   Procedure: LOWER EXTREMITY ANGIOGRAPHY;  Surgeon: Renford Dills, MD;  Location: [facility];  Service:  Cardiovascular;  Laterality: Right;   PERIPHERAL VASCULAR CATHETERIZATION N/A 02/09/2016   Procedure: Abdominal Aortogram w/Lower Extremity;  Surgeon: Renford Dills, MD;  Location: [facility];  Service: Cardiovascular;  Laterality: N/A;   PERIPHERAL VASCULAR CATHETERIZATION Right 02/10/2016   Procedure: Lower Extremity Angiography;  Surgeon: Renford Dills, MD;  Location: [facility];   Service: Cardiovascular;  Laterality: Right;   POLYPECTOMY  11/05/2017   Procedure: POLYPECTOMY;  Surgeon: Midge Minium, MD;  Location: [facility];  Service: Endoscopy;;   POLYPECTOMY  11/13/2022   Procedure: POLYPECTOMY;  Surgeon: Midge Minium, MD;  Location: [facility];  Service: Endoscopy;;   Sarcoidos     bone   SHOULDER ARTHROSCOPY WITH ROTATOR CUFF REPAIR AND SUBACROMIAL DECOMPRESSION Right 02/27/2020   Procedure: RIGHT SHOULDER ARTHROSCOPY SUBACROMIAL DECOMPRESSION, DISTAL CLAVICLE EXCISION AND MINI-OPEN ROTATOR CUFF REPAIR;  Surgeon: Juanell Fairly, MD;  Location: [facility];  Service: Orthopedics;  Laterality: Right;   THORACIC LAMINECTOMY FOR SPINAL CORD STIMULATOR N/A 08/28/2023   Procedure: THORACIC LAMINECTOMY FOR SPINAL CORD STIMULATOR PLACEMENT (MEDTRONIC);  Surgeon: Lovenia Kim, MD;  Location: [facility];  Service: Neurosurgery;  Laterality: N/A;   TONSILLECTOMY     VASCULAR SURGERY  [id number]   Fem-Pop Bypass    Allergies: Allergies as of 10/08/2023 - Review Complete 10/08/2023  Allergen Reaction Noted   Hydrocodone-acetaminophen  05/29/2019   Diltiazem Other (See Comments) 10/04/2022    Medications:  Current Outpatient Medications:    apixaban (ELIQUIS) 2.5 MG TABS tablet, Please do not continue until 09/10/22, Disp: 60 tablet, Rfl: 0   atorvastatin (LIPITOR) 40 MG tablet, Take 40 mg by mouth at bedtime. , Disp: , Rfl:    Blood Pressure Monitoring (BLOOD PRESSURE CUFF) MISC, 1 Units by Does not apply route daily as needed., Disp: 1 each, Rfl: 0   Calcium Carb-Cholecalciferol 600-10 MG-MCG TABS, Take 1 tablet by mouth 2 (two) times daily., Disp: , Rfl:    digoxin (LANOXIN) 0.125 MG tablet, Take 0.125 mg by mouth daily., Disp: , Rfl:    docusate sodium (COLACE) 100 MG capsule, Take 1 capsule (100 mg total) by mouth 2 (two) times daily., Disp: 10 capsule, Rfl: 0   DULoxetine (CYMBALTA) 60 MG capsule, TAKE 1 CAPSULE BY MOUTH TWICE A DAY, Disp: 180  capsule, Rfl: 3   ivabradine (CORLANOR) 7.5 MG TABS tablet, Take 1 tablet (7.5 mg total) by mouth 2 (two) times daily with a meal., Disp: 60 tablet, Rfl: 3   lidocaine (LIDODERM) 5 %, Place 1 patch onto the skin daily., Disp: , Rfl:    losartan (COZAAR) 25 MG tablet, Take 25 mg by mouth every morning. , Disp: , Rfl:    omeprazole (PRILOSEC) 40 MG capsule, Take 40 mg by mouth daily as needed (acid reflux)., Disp: , Rfl:    senna (SENOKOT) 8.6 MG TABS tablet, Take 1 tablet (8.6 mg total) by mouth daily., Disp: 120 tablet, Rfl: 0   traMADol (ULTRAM) 50 MG tablet, Take 1-2 tablets (50-100 mg total) by mouth every 6 (six) hours as needed., Disp: 30 tablet, Rfl: 0   triamcinolone ointment (KENALOG) 0.5 %, Apply 1 Application topically 2 (two) times daily., Disp: 30 g, Rfl: 3   zolpidem (AMBIEN) 10 MG tablet, TAKE 1 TABLET BY MOUTH NIGHTLY AT BEDTIME AS NEEDED FOR INSOMNIA (Patient taking differently: Take 10 mg by mouth at bedtime.), Disp: 30 tablet, Rfl: 1   gabapentin (NEURONTIN) 800 MG tablet, Take 1 tablet (800 mg total) by mouth  3 (three) times daily., Disp: 90 tablet, Rfl: 0  Social History: Social History   Tobacco Use   Smoking status: Former    Current packs/day: 0.00    Average packs/day: 2.0 packs/day for 25.0 years (50.0 ttl pk-yrs)    Types: Cigarettes    Start date: 08/21/1966    Quit date: 08/22/1991    Years since quitting: 32.1   Smokeless tobacco: Never  Vaping Use   Vaping status: Never Used  Substance Use Topics   Alcohol use: No    Alcohol/week: 0.0 standard drinks of alcohol   Drug use: Never    Family Medical History: Family History  Problem Relation Age of Onset   CVA Mother    Heart attack Mother    Cancer Father        colon cancer   Colon cancer Father    Heart disease Maternal Uncle    Breast cancer Neg Hx     Physical Examination: Vitals:   10/08/23 1033  BP: 122/68  Temp: 97.8 F (36.6 C)    General: Patient is in no apparent distress. Attention  to examination is appropriate.  Neck:   Supple.  Full range of motion.  Respiratory: Patient is breathing without any difficulty.   NEUROLOGICAL:     Awake, alert, oriented to person, place, and time.  Speech is clear and fluent.   Cranial Nerves: Pupils equal round and reactive to light.  Facial tone is symmetric.  Facial sensation is symmetric. Shoulder shrug is symmetric. Tongue protrusion is midline.    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  Reflexes are patellar reflex is 2+ on the left.  Bilateral upper and lower extremity sensation is intact to light touch      Incision is healing, there is some superficial eschar on the superior incision.  Medical Decision Making/Assessment and Plan: Ms. Billinger is a pleasant 73 y.o. 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  She has had significant stump and nerve pain ever since that time.  She has been following with our pain team and has had a previous spinal cord stimulator trial with percutaneous leads which resulted in 95 to 100% relief of her phantom limb pain.  Given her positive response she underwent a permanent paddle placement on 08/28/2023.  Overall she continues to have significant improvement.  She is sleeping through the night.  She is getting ready working with the neuromodulation team this morning.  Thank you for involving me in the care of this patient.    Lovenia Kim MD/MSCR Neurosurgery

## 2023-10-23 NOTE — Progress Notes (Unsigned)
 MRN : [id number]  Sara [name] is a 73 y.o. ([birth year]) female who presents with chief complaint of check circulation.  History of Present Illness:   The patient returns to the office for followup and review of the noninvasive studies regarding lower extremity occlusive disease.  She is also followed for carotid stenosis.   There have been no interval changes in lower extremity symptoms. No interval shortening of the patient's claudication distance or development of rest pain symptoms. No new ulcers or wounds have occurred since the last visit.   The patient has complaints of phantom pains involving the right lower extremity and the sensation that he she is still having excruciating pain in her right foot.  This continues to be her primary complaint.  The carotid stenosis followed by ultrasound.    The patient denies interval amaurosis fugax. There is no recent history of TIA symptoms or focal motor deficits. There is no prior documented CVA.   The patient is taking enteric-coated aspirin 81 mg daily.   There have been no significant changes to the patient's overall health care.   There is no history of DVT, PE or superficial thrombophlebitis. The patient denies recent episodes of angina or shortness of breath.    ABI Rt=AKA and Lt=1.10  (previous ABI's Rt=AKA and Lt=1.20 )   Carotid Duplex done today shows 1-39% bilateral ICA stenosis.  No change compared to last study.  No outpatient medications have been marked as taking for the 10/25/23 encounter (Appointment) with Gilda Crease, Latina Craver, MD.    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 Gilda Crease   Vertigo    Last episode approx Aug 2015   Wears dentures    full upper    Past Surgical History:  Procedure Laterality Date   ADENOIDECTOMY     AMPUTATION Right 08/11/2020   Procedure: AMPUTATION ABOVE KNEE;  Surgeon: Renford Dills, MD;  Location: [facility];  Service: Vascular;  Laterality: Right;   ARTERY BIOPSY Right 07/14/2019   Procedure: BIOPSY TEMPORAL ARTERY;  Surgeon: Renford Dills, MD;  Location: [facility];  Service: Vascular;  Laterality: Right;   BREAST CYST ASPIRATION Left    CARDIAC CATHETERIZATION  02/15/2017   [facility]   COLONOSCOPY WITH PROPOFOL N/A 10/22/2017   Procedure: COLONOSCOPY WITH PROPOFOL;  Surgeon: Midge Minium, MD;  Location: [facility];  Service: Endoscopy;  Laterality: N/A;  specimens not taken--pt on Plavix will be brought back in after 7 days off med   COLONOSCOPY WITH PROPOFOL N/A 11/05/2017  Procedure: COLONOSCOPY WITH PROPOFOL;  Surgeon: Midge Minium, MD;  Location: [facility];  Service: Endoscopy;  Laterality: N/A;   COLONOSCOPY WITH PROPOFOL N/A 11/13/2022   Procedure: COLONOSCOPY WITH PROPOFOL;  Surgeon: Midge Minium, MD;  Location: [facility];  Service: Endoscopy;  Laterality: N/A;   ESOPHAGOGASTRODUODENOSCOPY N/A 12/21/2014   Procedure: ESOPHAGOGASTRODUODENOSCOPY (EGD);  Surgeon: Midge Minium, MD;  Location: [facility];  Service: Gastroenterology;  Laterality: N/A;   ESOPHAGOGASTRODUODENOSCOPY (EGD) WITH PROPOFOL N/A 02/08/2016   Procedure: ESOPHAGOGASTRODUODENOSCOPY (EGD) WITH PROPOFOL;  Surgeon: Midge Minium, MD;  Location: [facility];  Service: Endoscopy;  Laterality: N/A;   FASCIOTOMY Right 05/30/2019   Procedure: FASCIOTOMY;  Surgeon: Renford Dills, MD;  Location: [facility];  Service: Vascular;  Laterality: Right;    FASCIOTOMY CLOSURE Right 06/04/2019   Procedure: FASCIOTOMY CLOSURE;  Surgeon: Renford Dills, MD;  Location: [facility];  Service: Vascular;  Laterality: Right;   HEMORROIDECTOMY  2014   LOWER EXTREMITY ANGIOGRAPHY Right 05/30/2019   Procedure: LOWER EXTREMITY ANGIOGRAPHY;  Surgeon: Renford Dills, MD;  Location: [facility];  Service: Cardiovascular;  Laterality: Right;   LOWER EXTREMITY ANGIOGRAPHY Right 07/02/2020   Procedure: LOWER EXTREMITY ANGIOGRAPHY;  Surgeon: Renford Dills, MD;  Location: [facility];  Service: Cardiovascular;  Laterality: Right;   PERIPHERAL VASCULAR CATHETERIZATION N/A 02/09/2016   Procedure: Abdominal Aortogram w/Lower Extremity;  Surgeon: Renford Dills, MD;  Location: [facility];  Service: Cardiovascular;  Laterality: N/A;   PERIPHERAL VASCULAR CATHETERIZATION Right 02/10/2016   Procedure: Lower Extremity Angiography;  Surgeon: Renford Dills, MD;  Location: [facility];  Service: Cardiovascular;  Laterality: Right;   POLYPECTOMY  11/05/2017   Procedure: POLYPECTOMY;  Surgeon: Midge Minium, MD;  Location: [facility];  Service: Endoscopy;;   POLYPECTOMY  11/13/2022   Procedure: POLYPECTOMY;  Surgeon: Midge Minium, MD;  Location: [facility];  Service: Endoscopy;;   Sarcoidos     bone   SHOULDER ARTHROSCOPY WITH ROTATOR CUFF REPAIR AND SUBACROMIAL DECOMPRESSION Right 02/27/2020   Procedure: RIGHT SHOULDER ARTHROSCOPY SUBACROMIAL DECOMPRESSION, DISTAL CLAVICLE EXCISION AND MINI-OPEN ROTATOR CUFF REPAIR;  Surgeon: Juanell Fairly, MD;  Location: [facility];  Service: Orthopedics;  Laterality: Right;   THORACIC LAMINECTOMY FOR SPINAL CORD STIMULATOR N/A 08/28/2023   Procedure: THORACIC LAMINECTOMY FOR SPINAL CORD STIMULATOR PLACEMENT (MEDTRONIC);  Surgeon: Lovenia Kim, MD;  Location: [facility];  Service: Neurosurgery;  Laterality: N/A;   TONSILLECTOMY     VASCULAR SURGERY  [id number]   Fem-Pop  Bypass    Social History Social History   Tobacco Use   Smoking status: Former    Current packs/day: 0.00    Average packs/day: 2.0 packs/day for 25.0 years (50.0 ttl pk-yrs)    Types: Cigarettes    Start date: 08/21/1966    Quit date: 08/22/1991    Years since quitting: 32.1   Smokeless tobacco: Never  Vaping Use   Vaping status: Never Used  Substance Use Topics   Alcohol use: No    Alcohol/week: 0.0 standard drinks of alcohol   Drug use: Never    Family History Family History  Problem Relation Age of Onset   CVA Mother    Heart attack Mother    Cancer Father        colon cancer   Colon cancer Father    Heart disease Maternal Uncle    Breast cancer Neg Hx     Allergies  Allergen Reactions   Hydrocodone-Acetaminophen  Other reaction(s): Flushing   Diltiazem Other (See Comments)    headache     REVIEW OF SYSTEMS (Negative unless checked)  Constitutional: [] Weight loss  [] Fever  [] Chills Cardiac: [] Chest pain   [] Chest pressure   [] Palpitations   [] Shortness of breath when laying flat   [] Shortness of breath with exertion. Vascular:  [x] Pain in legs with walking   [] Pain in legs at rest  [] History of DVT   [] Phlebitis   [] Swelling in legs   [] Varicose veins   [] Non-healing ulcers Pulmonary:   [] Uses home oxygen   [] Productive cough   [] Hemoptysis   [] Wheeze  [] COPD   [] Asthma Neurologic:  [] Dizziness   [] Seizures   [] History of stroke   [] History of TIA  [] Aphasia   [] Vissual changes   [] Weakness or numbness in arm   [] Weakness or numbness in leg Musculoskeletal:   [] Joint swelling   [] Joint pain   [] Low back pain Hematologic:  [] Easy bruising  [] Easy bleeding   [] Hypercoagulable state   [] Anemic Gastrointestinal:  [] Diarrhea   [] Vomiting  [] Gastroesophageal reflux/heartburn   [] Difficulty swallowing. Genitourinary:  [] Chronic kidney disease   [] Difficult urination  [] Frequent urination   [] Blood in urine Skin:  [] Rashes   [] Ulcers  Psychological:  [] History of  anxiety   []  History of major depression.  Physical Examination  There were no vitals filed for this visit. There is no height or weight on file to calculate BMI. Gen: WD/WN, NAD Head: [place]/AT, No temporalis wasting.  Ear/Nose/Throat: Hearing grossly intact, nares w/o erythema or drainage Eyes: PER, EOMI, sclera nonicteric.  Neck: Supple, no masses.  No bruit or JVD.  Pulmonary:  Good air movement, no audible wheezing, no use of accessory muscles.  Cardiac: RRR, normal S1, S2, no Murmurs. Vascular:  mild trophic changes, no open wounds Vessel Right Left  Radial Palpable Palpable  PT Not Palpable Not Palpable  DP Not Palpable Not Palpable  Gastrointestinal: soft, non-distended. No guarding/no peritoneal signs.  Musculoskeletal: M/S 5/5 throughout.  No visible deformity.  Neurologic: CN 2-12 intact. Pain and light touch intact in extremities.  Symmetrical.  Speech is fluent. Motor exam as listed above. Psychiatric: Judgment intact, Mood & affect appropriate for pt's clinical situation. Dermatologic: No rashes or ulcers noted.  No changes consistent with cellulitis.   CBC Lab Results  Component Value Date   WBC 5.9 08/13/2023   HGB 15.9 (H) 08/13/2023   HCT 47.5 (H) 08/13/2023   MCV 95.6 08/13/2023   PLT 155 08/13/2023    BMET    Component Value Date/Time   NA 140 09/19/2023 1412   K 5.1 09/19/2023 1412   CL 103 09/19/2023 1412   CO2 21 09/19/2023 1412   GLUCOSE 105 (H) 09/19/2023 1412   GLUCOSE 117 (H) 08/13/2023 1010   BUN 14 09/19/2023 1412   CREATININE 0.84 09/19/2023 1412   CREATININE 0.88 05/16/2016 1034   CALCIUM 9.4 09/19/2023 1412   GFRNONAA >60 08/13/2023 1010   GFRNONAA 66 05/01/2016 0846   GFRAA >60 02/18/2020 0949   GFRAA 76 05/01/2016 0846   CrCl cannot be calculated (Patient's most recent lab result is older than the maximum 21 days allowed.).  COAG Lab Results  Component Value Date   INR 1.0 08/09/2020   INR 1.3 (H) 02/18/2020   INR 1.0  07/11/2019    Radiology No results found.   Assessment/Plan There are no diagnoses linked to this encounter.   Levora Dredge, MD  10/23/2023 8:28 PM

## 2023-10-25 ENCOUNTER — Encounter (INDEPENDENT_AMBULATORY_CARE_PROVIDER_SITE_OTHER): Payer: Self-pay | Admitting: Vascular Surgery

## 2023-10-25 ENCOUNTER — Ambulatory Visit (INDEPENDENT_AMBULATORY_CARE_PROVIDER_SITE_OTHER): Payer: 59 | Admitting: Vascular Surgery

## 2023-10-25 VITALS — BP 113/73 | HR 75 | Resp 16 | Wt 130.0 lb

## 2023-10-25 DIAGNOSIS — G546 Phantom limb syndrome with pain: Secondary | ICD-10-CM

## 2023-10-25 DIAGNOSIS — Z89611 Acquired absence of right leg above knee: Secondary | ICD-10-CM

## 2023-10-25 DIAGNOSIS — I70212 Atherosclerosis of native arteries of extremities with intermittent claudication, left leg: Secondary | ICD-10-CM

## 2023-10-25 DIAGNOSIS — I6523 Occlusion and stenosis of bilateral carotid arteries: Secondary | ICD-10-CM

## 2023-10-25 DIAGNOSIS — M316 Other giant cell arteritis: Secondary | ICD-10-CM | POA: Diagnosis not present

## 2023-11-01 ENCOUNTER — Ambulatory Visit: Payer: Medicare PPO | Admitting: Cardiovascular Disease

## 2023-11-02 ENCOUNTER — Ambulatory Visit: Admitting: Cardiovascular Disease

## 2023-11-02 ENCOUNTER — Encounter: Payer: Self-pay | Admitting: Cardiovascular Disease

## 2023-11-02 VITALS — BP 121/71 | HR 55 | Ht 63.0 in | Wt 130.0 lb

## 2023-11-02 DIAGNOSIS — I340 Nonrheumatic mitral (valve) insufficiency: Secondary | ICD-10-CM

## 2023-11-02 DIAGNOSIS — I4711 Inappropriate sinus tachycardia, so stated: Secondary | ICD-10-CM | POA: Diagnosis not present

## 2023-11-02 DIAGNOSIS — E782 Mixed hyperlipidemia: Secondary | ICD-10-CM | POA: Diagnosis not present

## 2023-11-02 DIAGNOSIS — Z89611 Acquired absence of right leg above knee: Secondary | ICD-10-CM

## 2023-11-02 DIAGNOSIS — I2510 Atherosclerotic heart disease of native coronary artery without angina pectoris: Secondary | ICD-10-CM

## 2023-11-02 DIAGNOSIS — I10 Essential (primary) hypertension: Secondary | ICD-10-CM

## 2023-11-02 NOTE — Progress Notes (Signed)
 Cardiology Office Note   Date:  11/02/2023   ID:  Amoreena Neubert, DOB 22-May-1951, MRN [id number]  PCP:  Marisue Ivan, NP  Cardiologist:  Adrian Blackwater, MD      History of Present Illness: Sara [name] is a 73 y.o. female who presents for  Chief Complaint  Patient presents with   Follow-up    3 month follow up    Doing well      Past Medical History:  Diagnosis Date   Arthritis    right shoulder   Depression    Dyspnea    Employs prosthetic leg    Right   GCA (giant cell arteritis) (HCC)    GERD (gastroesophageal reflux disease)    History of blood clots    Hyperlipidemia    Hypertension    MGUS (monoclonal gammopathy of unknown significance) 11/2021   Mild mitral regurgitation    Osteoporosis    Peripheral vascular disease (HCC)    Sarcoidosis    Stomach ulcer    Vascular disease    Sees Dr. Gilda Crease   Vertigo    Last episode approx Aug 2015   Wears dentures    full upper     Past Surgical History:  Procedure Laterality Date   ADENOIDECTOMY     AMPUTATION Right 08/11/2020   Procedure: AMPUTATION ABOVE KNEE;  Surgeon: Renford Dills, MD;  Location: [facility];  Service: Vascular;  Laterality: Right;   ARTERY BIOPSY Right 07/14/2019   Procedure: BIOPSY TEMPORAL ARTERY;  Surgeon: Renford Dills, MD;  Location: [facility];  Service: Vascular;  Laterality: Right;   BREAST CYST ASPIRATION Left    CARDIAC CATHETERIZATION  02/15/2017   [facility]   COLONOSCOPY WITH PROPOFOL N/A 10/22/2017   Procedure: COLONOSCOPY WITH PROPOFOL;  Surgeon: Midge Minium, MD;  Location: [facility];  Service: Endoscopy;  Laterality: N/A;  specimens not taken--pt on Plavix will be brought back in after 7 days off med   COLONOSCOPY WITH PROPOFOL N/A 11/05/2017   Procedure: COLONOSCOPY WITH PROPOFOL;  Surgeon: Midge Minium, MD;  Location: [facility];  Service: Endoscopy;  Laterality: N/A;   COLONOSCOPY WITH PROPOFOL N/A 11/13/2022   Procedure:  COLONOSCOPY WITH PROPOFOL;  Surgeon: Midge Minium, MD;  Location: [facility];  Service: Endoscopy;  Laterality: N/A;   ESOPHAGOGASTRODUODENOSCOPY N/A 12/21/2014   Procedure: ESOPHAGOGASTRODUODENOSCOPY (EGD);  Surgeon: Midge Minium, MD;  Location: [facility];  Service: Gastroenterology;  Laterality: N/A;   ESOPHAGOGASTRODUODENOSCOPY (EGD) WITH PROPOFOL N/A 02/08/2016   Procedure: ESOPHAGOGASTRODUODENOSCOPY (EGD) WITH PROPOFOL;  Surgeon: Midge Minium, MD;  Location: [facility];  Service: Endoscopy;  Laterality: N/A;   FASCIOTOMY Right 05/30/2019   Procedure: FASCIOTOMY;  Surgeon: Renford Dills, MD;  Location: [facility];  Service: Vascular;  Laterality: Right;   FASCIOTOMY CLOSURE Right 06/04/2019   Procedure: FASCIOTOMY CLOSURE;  Surgeon: Renford Dills, MD;  Location: [facility];  Service: Vascular;  Laterality: Right;   HEMORROIDECTOMY  2014   LOWER EXTREMITY ANGIOGRAPHY Right 05/30/2019   Procedure: LOWER EXTREMITY ANGIOGRAPHY;  Surgeon: Renford Dills, MD;  Location: [facility];  Service: Cardiovascular;  Laterality: Right;   LOWER EXTREMITY ANGIOGRAPHY Right 07/02/2020   Procedure: LOWER EXTREMITY ANGIOGRAPHY;  Surgeon: Renford Dills, MD;  Location: [facility];  Service: Cardiovascular;  Laterality: Right;   PERIPHERAL VASCULAR CATHETERIZATION N/A 02/09/2016   Procedure: Abdominal Aortogram w/Lower Extremity;  Surgeon: Renford Dills, MD;  Location: [facility];  Service:  Cardiovascular;  Laterality: N/A;   PERIPHERAL VASCULAR CATHETERIZATION Right 02/10/2016   Procedure: Lower Extremity Angiography;  Surgeon: Renford Dills, MD;  Location: [facility];  Service: Cardiovascular;  Laterality: Right;   POLYPECTOMY  11/05/2017   Procedure: POLYPECTOMY;  Surgeon: Midge Minium, MD;  Location: [facility];  Service: Endoscopy;;   POLYPECTOMY  11/13/2022   Procedure: POLYPECTOMY;  Surgeon: Midge Minium, MD;   Location: [facility];  Service: Endoscopy;;   Sarcoidos     bone   SHOULDER ARTHROSCOPY WITH ROTATOR CUFF REPAIR AND SUBACROMIAL DECOMPRESSION Right 02/27/2020   Procedure: RIGHT SHOULDER ARTHROSCOPY SUBACROMIAL DECOMPRESSION, DISTAL CLAVICLE EXCISION AND MINI-OPEN ROTATOR CUFF REPAIR;  Surgeon: Juanell Fairly, MD;  Location: [facility];  Service: Orthopedics;  Laterality: Right;   THORACIC LAMINECTOMY FOR SPINAL CORD STIMULATOR N/A 08/28/2023   Procedure: THORACIC LAMINECTOMY FOR SPINAL CORD STIMULATOR PLACEMENT (MEDTRONIC);  Surgeon: Lovenia Kim, MD;  Location: [facility];  Service: Neurosurgery;  Laterality: N/A;   TONSILLECTOMY     VASCULAR SURGERY  [id number]   Fem-Pop Bypass     Current Outpatient Medications  Medication Sig Dispense Refill   apixaban (ELIQUIS) 2.5 MG TABS tablet Please do not continue until 09/10/22 60 tablet 0   atorvastatin (LIPITOR) 40 MG tablet Take 40 mg by mouth at bedtime.      Blood Pressure Monitoring (BLOOD PRESSURE CUFF) MISC 1 Units by Does not apply route daily as needed. 1 each 0   Calcium Carb-Cholecalciferol 600-10 MG-MCG TABS Take 1 tablet by mouth 2 (two) times daily.     digoxin (LANOXIN) 0.125 MG tablet Take 0.125 mg by mouth daily.     DULoxetine (CYMBALTA) 60 MG capsule TAKE 1 CAPSULE BY MOUTH TWICE A DAY 180 capsule 3   gabapentin (NEURONTIN) 800 MG tablet Take 1 tablet (800 mg total) by mouth 3 (three) times daily. 90 tablet 0   ivabradine (CORLANOR) 7.5 MG TABS tablet Take 1 tablet (7.5 mg total) by mouth 2 (two) times daily with a meal. 60 tablet 3   losartan (COZAAR) 25 MG tablet Take 25 mg by mouth every morning.      senna (SENOKOT) 8.6 MG TABS tablet Take 1 tablet (8.6 mg total) by mouth daily. 120 tablet 0   triamcinolone ointment (KENALOG) 0.5 % Apply 1 Application topically 2 (two) times daily. 30 g 3   zolpidem (AMBIEN) 10 MG tablet TAKE 1 TABLET BY MOUTH NIGHTLY AT BEDTIME AS NEEDED FOR INSOMNIA (Patient taking  differently: Take 10 mg by mouth at bedtime.) 30 tablet 1   No current facility-administered medications for this visit.    Allergies:   Hydrocodone-acetaminophen and Diltiazem    Social History:   reports that she quit smoking about 32 years ago. Her smoking use included cigarettes. She started smoking about 57 years ago. She has a 50 pack-year smoking history. She has never used smokeless tobacco. She reports that she does not drink alcohol and does not use drugs.   Family History:  family history includes CVA in her mother; Cancer in her father; Colon cancer in her father; Heart attack in her mother; Heart disease in her maternal uncle.    ROS:     Review of Systems  Constitutional: Negative.   HENT: Negative.    Eyes: Negative.   Respiratory: Negative.    Gastrointestinal: Negative.   Genitourinary: Negative.   Musculoskeletal: Negative.   Skin: Negative.   Neurological: Negative.   Endo/Heme/Allergies: Negative.   Psychiatric/Behavioral: Negative.  All other systems reviewed and are negative.     All other systems are reviewed and negative.    PHYSICAL EXAM: VS:  BP 121/71   Pulse (!) 55   Ht 5\' 3"  (1.6 m)   Wt 130 lb (59 kg)   SpO2 96%   BMI 23.03 kg/m  , BMI Body mass index is 23.03 kg/m. Last weight:  Wt Readings from Last 3 Encounters:  11/02/23 130 lb (59 kg)  10/25/23 130 lb (59 kg)  10/08/23 130 lb (59 kg)     Physical Exam Constitutional:      Appearance: Normal appearance.  Cardiovascular:     Rate and Rhythm: Normal rate and regular rhythm.     Heart sounds: Normal heart sounds.  Pulmonary:     Effort: Pulmonary effort is normal.     Breath sounds: Normal breath sounds.  Musculoskeletal:     Right lower leg: No edema.     Left lower leg: No edema.  Neurological:     Mental Status: She is alert.       EKG:   Recent Labs: 08/13/2023: Hemoglobin 15.9; Platelets 155 09/19/2023: ALT 37; BUN 14; Creatinine, Ser 0.84; Potassium 5.1;  Sodium 140; TSH 1.090    Lipid Panel    Component Value Date/Time   CHOL 110 09/19/2023 1412   TRIG 83 09/19/2023 1412   HDL 54 09/19/2023 1412   CHOLHDL 2.0 09/19/2023 1412   LDLCALC 40 09/19/2023 1412      Other studies Reviewed: Additional studies/ records that were reviewed today include:  Review of the above records demonstrates:       No data to display            ASSESSMENT AND PLAN:    ICD-10-CM   1. Primary hypertension  I10     2. Non-rheumatic mitral regurgitation  I34.0     3. Inappropriate sinus tachycardia (HCC)  I47.11    HR good    4. Mixed hyperlipidemia  E78.2     5. S/P AKA (above knee amputation), right (HCC)  Z89.611     6. Coronary artery disease involving native coronary artery of native heart without angina pectoris  I25.10    no chest pain       Problem List Items Addressed This Visit       Cardiovascular and Mediastinum   CAD (coronary artery disease)   Non-rheumatic mitral regurgitation   Inappropriate sinus tachycardia (HCC)     Other   Hyperlipidemia   S/P AKA (above knee amputation), right (HCC)   Other Visit Diagnoses       Primary hypertension    -  Primary          Disposition:   Return in about 3 months (around 02/02/2024).    Total time spent: 35 minutes  Signed,  Adrian Blackwater, MD  11/02/2023 11:24 AM    [facility]

## 2023-12-04 ENCOUNTER — Encounter: Payer: Self-pay | Admitting: Cardiology

## 2023-12-04 ENCOUNTER — Ambulatory Visit (INDEPENDENT_AMBULATORY_CARE_PROVIDER_SITE_OTHER): Admitting: Cardiology

## 2023-12-04 VITALS — BP 130/78 | HR 98 | Ht 63.0 in | Wt 141.0 lb

## 2023-12-04 DIAGNOSIS — F339 Major depressive disorder, recurrent, unspecified: Secondary | ICD-10-CM | POA: Diagnosis not present

## 2023-12-04 DIAGNOSIS — Z0130 Encounter for examination of blood pressure without abnormal findings: Secondary | ICD-10-CM

## 2023-12-04 DIAGNOSIS — R7303 Prediabetes: Secondary | ICD-10-CM

## 2023-12-04 MED ORDER — CARIPRAZINE HCL 1.5 MG PO CAPS: 1.5000 mg | ORAL_CAPSULE | Freq: Every day | ORAL | Status: DC

## 2023-12-04 NOTE — Progress Notes (Signed)
 Established Patient Office Visit  Subjective:  Patient ID: Sara [name], female    DOB: 03-05-1951  Age: 73 y.o. MRN: [id number]  Chief Complaint  Patient presents with   Follow-up    Discuss Medication Cymbalta    Patient in office for an acute visit, wants to discuss her medications. Patient states she doesn't think her Cymbalta is working any more. States she is sleeping a lot during the day. Patient on maximum dose of Cymbalta. Will add Vraylar, samples given to patient.  Patient complains of cyst on left wrist, states it is slowly getting bigger. Offered to refer patient to general surgery, patient declined. Patient states she will monitor it. If she wants to have surgery, she will go to the [place].  Patient reports one episode of double vision that lasted about 5 minutes. Episode occurred over a month ago, has not happened again since. Patient scheduled for eye exam next week.     No other concerns at this time.   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 Gilda Crease   Vertigo    Last episode approx Aug 2015   Wears dentures    full upper    Past Surgical History:  Procedure Laterality Date   ADENOIDECTOMY     AMPUTATION Right 08/11/2020   Procedure: AMPUTATION ABOVE KNEE;  Surgeon: Renford Dills, MD;  Location: [facility];  Service: Vascular;  Laterality: Right;   ARTERY BIOPSY Right 07/14/2019   Procedure: BIOPSY TEMPORAL ARTERY;  Surgeon: Renford Dills, MD;  Location: [facility];  Service: Vascular;  Laterality: Right;    BREAST CYST ASPIRATION Left    CARDIAC CATHETERIZATION  02/15/2017   [facility]   COLONOSCOPY WITH PROPOFOL N/A 10/22/2017   Procedure: COLONOSCOPY WITH PROPOFOL;  Surgeon: Midge Minium, MD;  Location: [facility];  Service: Endoscopy;  Laterality: N/A;  specimens not taken--pt on Plavix will be brought back in after 7 days off med   COLONOSCOPY WITH PROPOFOL N/A 11/05/2017   Procedure: COLONOSCOPY WITH PROPOFOL;  Surgeon: Midge Minium, MD;  Location: [facility];  Service: Endoscopy;  Laterality: N/A;   COLONOSCOPY WITH PROPOFOL N/A 11/13/2022   Procedure: COLONOSCOPY WITH PROPOFOL;  Surgeon: Midge Minium, MD;  Location: [facility];  Service: Endoscopy;  Laterality: N/A;   ESOPHAGOGASTRODUODENOSCOPY N/A 12/21/2014   Procedure: ESOPHAGOGASTRODUODENOSCOPY (EGD);  Surgeon: Midge Minium, MD;  Location: [facility];  Service: Gastroenterology;  Laterality: N/A;   ESOPHAGOGASTRODUODENOSCOPY (EGD) WITH PROPOFOL N/A 02/08/2016   Procedure: ESOPHAGOGASTRODUODENOSCOPY (EGD) WITH PROPOFOL;  Surgeon: Midge Minium, MD;  Location: [facility];  Service: Endoscopy;  Laterality: N/A;   FASCIOTOMY Right 05/30/2019   Procedure: FASCIOTOMY;  Surgeon: Renford Dills, MD;  Location: [facility];  Service: Vascular;  Laterality: Right;   FASCIOTOMY CLOSURE Right 06/04/2019   Procedure: FASCIOTOMY CLOSURE;  Surgeon: Renford Dills, MD;  Location: [facility];  Service: Vascular;  Laterality: Right;   HEMORROIDECTOMY  2014   LOWER EXTREMITY ANGIOGRAPHY Right 05/30/2019   Procedure: LOWER EXTREMITY ANGIOGRAPHY;  Surgeon: Jackquelyn Mass, MD;  Location: [facility];  Service: Cardiovascular;  Laterality: Right;   LOWER EXTREMITY ANGIOGRAPHY Right 07/02/2020   Procedure: LOWER EXTREMITY ANGIOGRAPHY;  Surgeon: Jackquelyn Mass, MD;  Location: [facility];  Service: Cardiovascular;  Laterality: Right;   PERIPHERAL VASCULAR CATHETERIZATION N/A 02/09/2016   Procedure:  Abdominal Aortogram w/Lower Extremity;  Surgeon: Jackquelyn Mass, MD;  Location: [facility];  Service: Cardiovascular;  Laterality: N/A;   PERIPHERAL VASCULAR CATHETERIZATION Right 02/10/2016   Procedure: Lower Extremity Angiography;  Surgeon: Jackquelyn Mass, MD;  Location: [facility];  Service: Cardiovascular;  Laterality: Right;   POLYPECTOMY  11/05/2017   Procedure: POLYPECTOMY;  Surgeon: Marnee Sink, MD;  Location: [facility];  Service: Endoscopy;;   POLYPECTOMY  11/13/2022   Procedure: POLYPECTOMY;  Surgeon: Marnee Sink, MD;  Location: [facility];  Service: Endoscopy;;   Sarcoidos     bone   SHOULDER ARTHROSCOPY WITH ROTATOR CUFF REPAIR AND SUBACROMIAL DECOMPRESSION Right 02/27/2020   Procedure: RIGHT SHOULDER ARTHROSCOPY SUBACROMIAL DECOMPRESSION, DISTAL CLAVICLE EXCISION AND MINI-OPEN ROTATOR CUFF REPAIR;  Surgeon: Rande Bushy, MD;  Location: [facility];  Service: Orthopedics;  Laterality: Right;   THORACIC LAMINECTOMY FOR SPINAL CORD STIMULATOR N/A 08/28/2023   Procedure: THORACIC LAMINECTOMY FOR SPINAL CORD STIMULATOR PLACEMENT (MEDTRONIC);  Surgeon: Carroll Clamp, MD;  Location: [facility];  Service: Neurosurgery;  Laterality: N/A;   TONSILLECTOMY     VASCULAR SURGERY  [id number]   Fem-Pop Bypass    Social History   Socioeconomic History   Marital status: Single    Spouse name: Not on file   Number of children: Not on file   Years of education: Not on file   Highest education level: Not on file  Occupational History   Not on file  Tobacco Use   Smoking status: Former    Current packs/day: 0.00    Average packs/day: 2.0 packs/day for 25.0 years (50.0 ttl pk-yrs)    Types: Cigarettes    Start date: 08/21/1966    Quit date: 08/22/1991    Years since quitting: 32.3   Smokeless tobacco: Never  Vaping Use   Vaping status: Never Used  Substance and Sexual Activity   Alcohol use: No    Alcohol/week: 0.0 standard drinks of alcohol    Drug use: Never   Sexual activity: Not Currently  Other Topics Concern   Not on file  Social History Narrative   Not on file   Social Drivers of Health   Financial Resource Strain: Patient Declined (07/16/2023)   Received from [facility]   Overall Financial Resource Strain (CARDIA)    Difficulty of Paying Living Expenses: Patient declined  Food Insecurity: Patient Declined (07/16/2023)   Received from [facility]   Hunger Vital Sign    Worried About Running Out of Food in the Last Year: Patient declined    Ran Out of Food in the Last Year: Patient declined  Transportation Needs: Patient Declined (07/16/2023)   Received from [facility]   PRAPARE - Transportation    In the past 12 months, has lack of transportation kept you from medical appointments or from getting medications?: Patient declined    Lack of Transportation (Non-Medical): Patient declined  Physical Activity: Not on file  Stress: Not on file  Social Connections: Not on file  Intimate Partner  Violence: Not on file    Family History  Problem Relation Age of Onset   CVA Mother    Heart attack Mother    Cancer Father        colon cancer   Colon cancer Father    Heart disease Maternal Uncle    Breast cancer Neg Hx     Allergies  Allergen Reactions   Hydrocodone-Acetaminophen     Other reaction(s): [place]   Diltiazem Other (See Comments)    headache    Outpatient Medications Prior to Visit  Medication Sig   apixaban (ELIQUIS) 2.5 MG TABS tablet Please do not continue until 09/10/22   atorvastatin (LIPITOR) 40 MG tablet Take 40 mg by mouth at bedtime.    Blood Pressure Monitoring (BLOOD PRESSURE CUFF) MISC 1 Units by Does not apply route daily as needed.   Calcium Carb-Cholecalciferol 600-10 MG-MCG TABS Take 1 tablet by mouth 2 (two) times daily.   digoxin (LANOXIN) 0.125 MG tablet Take 0.125 mg by mouth daily.   DULoxetine (CYMBALTA) 60 MG capsule  TAKE 1 CAPSULE BY MOUTH TWICE A DAY   ivabradine (CORLANOR) 7.5 MG TABS tablet Take 1 tablet (7.5 mg total) by mouth 2 (two) times daily with a meal.   losartan (COZAAR) 25 MG tablet Take 25 mg by mouth every morning.    triamcinolone ointment (KENALOG) 0.5 % Apply 1 Application topically 2 (two) times daily.   zolpidem (AMBIEN) 10 MG tablet TAKE 1 TABLET BY MOUTH NIGHTLY AT BEDTIME AS NEEDED FOR INSOMNIA (Patient taking differently: Take 10 mg by mouth at bedtime.)   gabapentin (NEURONTIN) 800 MG tablet Take 1 tablet (800 mg total) by mouth 3 (three) times daily.   No facility-administered medications prior to visit.    Review of Systems  Constitutional: Negative.   HENT: Negative.    Eyes:  Positive for double vision.  Respiratory: Negative.  Negative for shortness of breath.   Cardiovascular: Negative.  Negative for chest pain.  Gastrointestinal: Negative.  Negative for abdominal pain, constipation and diarrhea.  Genitourinary: Negative.   Musculoskeletal:  Negative for joint pain and myalgias.  Skin: Negative.   Neurological: Negative.  Negative for dizziness and headaches.  Endo/Heme/Allergies: Negative.   All other systems reviewed and are negative.      Objective:   BP 130/78   Pulse 98   Ht 5\' 3"  (1.6 m)   Wt 141 lb (64 kg)   SpO2 95%   BMI 24.98 kg/m   Vitals:   12/04/23 1044  BP: 130/78  Pulse: 98  Height: 5\' 3"  (1.6 m)  Weight: 141 lb (64 kg)  SpO2: 95%  BMI (Calculated): 24.98    Physical Exam Vitals and nursing note reviewed.  Constitutional:      Appearance: Normal appearance. She is normal weight.  HENT:     Head: Normocephalic and atraumatic.     Nose: Nose normal.     Mouth/Throat:     Mouth: Mucous membranes are moist.  Eyes:     Extraocular Movements: Extraocular movements intact.     Conjunctiva/sclera: Conjunctivae normal.     Pupils: Pupils are equal, round, and reactive to light.  Cardiovascular:     Rate and Rhythm: Normal rate and  regular rhythm.     Pulses: Normal pulses.     Heart sounds: Normal heart sounds.  Pulmonary:     Effort: Pulmonary effort is normal.     Breath sounds: Normal breath sounds.  Abdominal:  General: Abdomen is flat. Bowel sounds are normal.     Palpations: Abdomen is soft.  Musculoskeletal:        General: Normal range of motion.       Arms:     Cervical back: Normal range of motion.  Skin:    General: Skin is warm and dry.  Neurological:     General: No focal deficit present.     Mental Status: She is alert and oriented to person, place, and time.  Psychiatric:        Mood and Affect: Mood normal.        Behavior: Behavior normal.        Thought Content: Thought content normal.        Judgment: Judgment normal.      No results found for any visits on 12/04/23.  Recent Results (from the past 2160 hours)  Hemoglobin A1c     Status: Abnormal   Collection Time: 09/19/23  2:12 PM  Result Value Ref Range   Hgb A1c MFr Bld 6.2 (H) 4.8 - 5.6 %    Comment:          Prediabetes: 5.7 - 6.4          Diabetes: >6.4          Glycemic control for adults with diabetes: <7.0    Est. average glucose Bld gHb Est-mCnc 131 mg/dL  Lipid panel     Status: None   Collection Time: 09/19/23  2:12 PM  Result Value Ref Range   Cholesterol, Total 110 100 - 199 mg/dL   Triglycerides 83 0 - 149 mg/dL   HDL 54 >38 mg/dL   VLDL Cholesterol Cal 16 5 - 40 mg/dL   LDL Chol Calc (NIH) 40 0 - 99 mg/dL   Chol/HDL Ratio 2.0 0.0 - 4.4 ratio    Comment:                                   T. Chol/HDL Ratio                                             Men  Women                               1/2 Avg.Risk  3.4    3.3                                   Avg.Risk  5.0    4.4                                2X Avg.Risk  9.6    7.1                                3X Avg.Risk 23.4   11.0   TSH     Status: None   Collection Time: 09/19/23  2:12 PM  Result Value Ref Range   TSH 1.090 0.450 - 4.500 uIU/mL   CMP14+EGFR     Status: Abnormal  Collection Time: 09/19/23  2:12 PM  Result Value Ref Range   Glucose 105 (H) 70 - 99 mg/dL   BUN 14 8 - 27 mg/dL   Creatinine, Ser 4.13 0.57 - 1.00 mg/dL   eGFR 75 >24 MW/NUU/7.25   BUN/Creatinine Ratio 17 12 - 28   Sodium 140 134 - 144 mmol/L   Potassium 5.1 3.5 - 5.2 mmol/L   Chloride 103 96 - 106 mmol/L   CO2 21 20 - 29 mmol/L   Calcium 9.4 8.7 - 10.3 mg/dL   Total Protein 6.9 6.0 - 8.5 g/dL   Albumin 4.4 3.9 - 4.9 g/dL   Globulin, Total 2.5 1.5 - 4.5 g/dL   Bilirubin Total 0.4 0.0 - 1.2 mg/dL   Alkaline Phosphatase 84 44 - 121 IU/L   AST 42 (H) 0 - 40 IU/L   ALT 37 (H) 0 - 32 IU/L      Assessment & Plan:  Add Vraylar Keep appointments with specialists Follow up as previously scheduled  Problem List Items Addressed This Visit       Other   Chronic recurrent major depressive disorder (HCC) - Primary    Return if symptoms worsen or fail to improve.   Total time spent: 25 minutes  [employer], NP  12/04/2023   This document may have been prepared by Dragon Voice Recognition software and as such may include unintentional dictation errors.

## 2023-12-12 ENCOUNTER — Emergency Department

## 2023-12-12 ENCOUNTER — Emergency Department
Admission: EM | Admit: 2023-12-12 | Discharge: 2023-12-12 | Disposition: A | Discharge status: Home / Self Care | Attending: Emergency Medicine | Admitting: Emergency Medicine

## 2023-12-12 DIAGNOSIS — Y92009 Unspecified place in unspecified non-institutional (private) residence as the place of occurrence of the external cause: Principal | ICD-10-CM | POA: Insufficient documentation

## 2023-12-12 DIAGNOSIS — S20212A Contusion of left front wall of thorax, initial encounter: Secondary | ICD-10-CM | POA: Insufficient documentation

## 2023-12-12 DIAGNOSIS — S299XXA Unspecified injury of thorax, initial encounter: Secondary | ICD-10-CM | POA: Diagnosis present

## 2023-12-12 DIAGNOSIS — I10 Essential (primary) hypertension: Secondary | ICD-10-CM | POA: Insufficient documentation

## 2023-12-12 DIAGNOSIS — W01198A Fall on same level from slipping, tripping and stumbling with subsequent striking against other object, initial encounter: Secondary | ICD-10-CM | POA: Diagnosis not present

## 2023-12-12 DIAGNOSIS — Z043 Encounter for examination and observation following other accident: Secondary | ICD-10-CM | POA: Diagnosis not present

## 2023-12-12 DIAGNOSIS — G459 Transient cerebral ischemic attack, unspecified: Secondary | ICD-10-CM | POA: Diagnosis present

## 2023-12-12 MED ORDER — HYDROCODONE-ACETAMINOPHEN 5-325 MG PO TABS: 1.0000 | ORAL_TABLET | Freq: Three times a day (TID) | ORAL | 0 refills | Status: AC | PRN

## 2023-12-12 MED ORDER — SODIUM CHLORIDE 0.9 % IV SOLN: INTRAVENOUS | Status: DC

## 2023-12-12 MED ORDER — ACETAMINOPHEN 650 MG RE SUPP: 650.0000 mg | RECTAL | Status: DC | PRN

## 2023-12-12 MED ORDER — HYDROCODONE-ACETAMINOPHEN 5-325 MG PO TABS
1.0000 | ORAL_TABLET | Freq: Once | ORAL | Status: AC
  Administered 2023-12-12: 1 via ORAL
  Filled 2023-12-12: qty 1

## 2023-12-12 MED ORDER — ACETAMINOPHEN 160 MG/5ML PO SOLN: 650.0000 mg | ORAL | Status: DC | PRN

## 2023-12-12 MED ORDER — LIDOCAINE 5 % EX PTCH
1.0000 | MEDICATED_PATCH | Freq: Once | CUTANEOUS | Status: DC
  Administered 2023-12-12: 1 via TRANSDERMAL
  Filled 2023-12-12: qty 1

## 2023-12-12 MED ORDER — LIDOCAINE 5 % EX PTCH: 1.0000 | MEDICATED_PATCH | Freq: Two times a day (BID) | CUTANEOUS | 0 refills | Status: AC | PRN

## 2023-12-12 MED ORDER — STROKE: EARLY STAGES OF RECOVERY BOOK
Freq: Once | Status: DC
Start: 2023-12-13 — End: 2023-12-12

## 2023-12-12 MED ORDER — ACETAMINOPHEN 325 MG PO TABS
650.0000 mg | ORAL_TABLET | ORAL | Status: DC | PRN
Start: 2023-12-12 — End: 2023-12-12

## 2023-12-12 NOTE — ED Provider Notes (Signed)
 [facility] Emergency Department Provider Note     Event Date/Time   First MD Initiated Contact with Patient 12/12/23 1853     (approximate)   History   Fall   HPI  Sara [name] is a 73 y.o. female with a history of osteoporosis, HTN, sarcoidosis, peripheral vessel disease, right knee AKA, presents to the ED from home.  Patient would endorse recent falls due to her new prosthetic leg.  She presents today after a fall yesterday where she hit her ribs.  She denies any head injury or LOC.  She does endorse pain to the right side of the rib cage with movement.  No cough, congestion, or hemoptysis reported.  Physical Exam   Triage Vital Signs: ED Triage Vitals  Encounter Vitals Group     BP 12/12/23 1826 (!) 155/74     Systolic BP Percentile --      Diastolic BP Percentile --      Pulse Rate 12/12/23 1826 67     Resp 12/12/23 1826 18     Temp 12/12/23 1826 98.4 F (36.9 C)     Temp Source 12/12/23 1826 Oral     SpO2 12/12/23 1826 93 %     Weight 12/12/23 1821 130 lb (59 kg)     Height 12/12/23 1821 5\' 3"  (1.6 m)     Head Circumference --      Peak Flow --      Pain Score 12/12/23 1821 10     Pain Loc --      Pain Education --      Exclude from Growth Chart --     Most recent vital signs: Vitals:   12/12/23 1826 12/12/23 2045  BP: (!) 155/74   Pulse: 67 68  Resp: 18   Temp: 98.4 F (36.9 C)   SpO2: 93% 94%    General Awake, no distress. NAD HEENT NCAT. PERRL. EOMI. No rhinorrhea. Mucous membranes are moist.  CV:  Good peripheral perfusion. RRR RESP:  Normal effort. CTA ABD:  No distention.  MSK:  AROM   ED Results / Procedures / Treatments   Labs (all labs ordered are listed, but only abnormal results are displayed) Labs Reviewed - No data to display   EKG   RADIOLOGY  I personally viewed and evaluated these images as part of my medical decision making, as well as reviewing the written report by the  radiologist.  ED Provider Interpretation: No acute findings   Left Ribs w/ CXR  IMPRESSION: Negative.   PROCEDURES:  Critical Care performed: No  Procedures   MEDICATIONS ORDERED IN ED: Medications  HYDROcodone -acetaminophen  (NORCO/VICODIN) 5-325 MG per tablet 1 tablet (1 tablet Oral Given 12/12/23 2111)     IMPRESSION / MDM / ASSESSMENT AND PLAN / ED COURSE  I reviewed the triage vital signs and the nursing notes.                              Differential diagnosis includes, but is not limited to, chest contusion, rib fracture, rib contusion, pneumothorax, CAP, myalgias  Patient's presentation is most consistent with acute complicated illness / injury requiring diagnostic workup.  Patient's diagnosis is consistent with chest wall contusion without evidence of rib fracture or pneumothorax.  The patient exam and workup at this time patient presents for evaluation of chest wall pain after multiple falls over the last few weeks due to her new  prosthetic to the RLE.  Patient denies any head injury or LOC.  No cough or hemoptysis is reported.  X-ray reviewed by me, reveals no acute intrathoracic process.  Likely represent contusion and rib pain.  Patient will be discharged home with prescriptions for hydrocodone  and Lidoderm  patches. Patient is to follow up with PCP as discussed, as needed or otherwise directed. Patient is given ED precautions to return to the ED for any worsening or new symptoms.   FINAL CLINICAL IMPRESSION(S) / ED DIAGNOSES   Final diagnoses:  Fall in home, initial encounter  Rib contusion, left, initial encounter     Rx / DC Orders   ED Discharge Orders          Ordered    HYDROcodone -acetaminophen  (NORCO/VICODIN) 5-325 MG tablet  3 times daily PRN        12/12/23 2109    lidocaine  (LIDODERM ) 5 %  Every 12 hours PRN        12/12/23 2109             Note:  This document was prepared using Dragon voice recognition software and may include  unintentional dictation errors.    May Sparks, PA-C 12/14/23 1815    Lind Repine, MD 12/17/23 [phone]

## 2023-12-12 NOTE — Discharge Instructions (Addendum)
 Take the prescription medicine as directed.

## 2023-12-12 NOTE — ED Notes (Signed)
 Assisted pt from bed to wheel chair, nadn

## 2023-12-12 NOTE — ED Triage Notes (Signed)
 Pt presents to the ED POV from home with family. Pt reports getting a new prosthetic and having multiple falls. Pt reports falling and hitting her ribs yesterday. Pt denies hitting head when she fell. No LOC. Pt reports right sided rib cage pain when moving. Denies pain when sitting still.

## 2023-12-19 ENCOUNTER — Other Ambulatory Visit: Payer: Self-pay

## 2023-12-19 ENCOUNTER — Encounter: Payer: Self-pay | Admitting: Internal Medicine

## 2023-12-19 ENCOUNTER — Emergency Department

## 2023-12-19 ENCOUNTER — Observation Stay
Admission: EM | Admit: 2023-12-19 | Discharge: 2023-12-20 | Disposition: A | Discharge status: Home / Self Care | Attending: Internal Medicine | Admitting: Internal Medicine

## 2023-12-19 DIAGNOSIS — M546 Pain in thoracic spine: Secondary | ICD-10-CM | POA: Diagnosis present

## 2023-12-19 DIAGNOSIS — Z8249 Family history of ischemic heart disease and other diseases of the circulatory system: Secondary | ICD-10-CM | POA: Diagnosis not present

## 2023-12-19 DIAGNOSIS — G546 Phantom limb syndrome with pain: Secondary | ICD-10-CM | POA: Diagnosis present

## 2023-12-19 DIAGNOSIS — G4700 Insomnia, unspecified: Secondary | ICD-10-CM | POA: Diagnosis not present

## 2023-12-19 DIAGNOSIS — K2990 Gastroduodenitis, unspecified, without bleeding: Secondary | ICD-10-CM | POA: Diagnosis not present

## 2023-12-19 DIAGNOSIS — S060XAA Concussion with loss of consciousness status unknown, initial encounter: Secondary | ICD-10-CM | POA: Diagnosis not present

## 2023-12-19 DIAGNOSIS — S0083XA Contusion of other part of head, initial encounter: Principal | ICD-10-CM | POA: Insufficient documentation

## 2023-12-19 DIAGNOSIS — M316 Other giant cell arteritis: Secondary | ICD-10-CM | POA: Diagnosis present

## 2023-12-19 DIAGNOSIS — F32A Depression, unspecified: Secondary | ICD-10-CM | POA: Diagnosis present

## 2023-12-19 DIAGNOSIS — E785 Hyperlipidemia, unspecified: Secondary | ICD-10-CM | POA: Diagnosis present

## 2023-12-19 DIAGNOSIS — I10 Essential (primary) hypertension: Secondary | ICD-10-CM | POA: Diagnosis not present

## 2023-12-19 DIAGNOSIS — Z9181 History of falling: Secondary | ICD-10-CM | POA: Diagnosis not present

## 2023-12-19 DIAGNOSIS — Z9689 Presence of other specified functional implants: Secondary | ICD-10-CM

## 2023-12-19 DIAGNOSIS — G459 Transient cerebral ischemic attack, unspecified: Secondary | ICD-10-CM | POA: Diagnosis present

## 2023-12-19 DIAGNOSIS — R0902 Hypoxemia: Secondary | ICD-10-CM | POA: Diagnosis not present

## 2023-12-19 DIAGNOSIS — I739 Peripheral vascular disease, unspecified: Secondary | ICD-10-CM | POA: Diagnosis present

## 2023-12-19 DIAGNOSIS — T148XXA Other injury of unspecified body region, initial encounter: Secondary | ICD-10-CM | POA: Insufficient documentation

## 2023-12-19 DIAGNOSIS — M792 Neuralgia and neuritis, unspecified: Secondary | ICD-10-CM | POA: Diagnosis present

## 2023-12-19 DIAGNOSIS — K2970 Gastritis, unspecified, without bleeding: Secondary | ICD-10-CM | POA: Diagnosis present

## 2023-12-19 DIAGNOSIS — Z9189 Other specified personal risk factors, not elsewhere classified: Secondary | ICD-10-CM

## 2023-12-19 DIAGNOSIS — G479 Sleep disorder, unspecified: Secondary | ICD-10-CM | POA: Insufficient documentation

## 2023-12-19 DIAGNOSIS — Z9682 Presence of neurostimulator: Secondary | ICD-10-CM | POA: Insufficient documentation

## 2023-12-19 DIAGNOSIS — K219 Gastro-esophageal reflux disease without esophagitis: Secondary | ICD-10-CM | POA: Diagnosis present

## 2023-12-19 DIAGNOSIS — G894 Chronic pain syndrome: Secondary | ICD-10-CM | POA: Diagnosis not present

## 2023-12-19 DIAGNOSIS — Z87891 Personal history of nicotine dependence: Secondary | ICD-10-CM | POA: Insufficient documentation

## 2023-12-19 DIAGNOSIS — G928 Other toxic encephalopathy: Secondary | ICD-10-CM | POA: Diagnosis not present

## 2023-12-19 DIAGNOSIS — Z89611 Acquired absence of right leg above knee: Secondary | ICD-10-CM | POA: Diagnosis not present

## 2023-12-19 DIAGNOSIS — F339 Major depressive disorder, recurrent, unspecified: Secondary | ICD-10-CM | POA: Diagnosis not present

## 2023-12-19 DIAGNOSIS — X58XXXA Exposure to other specified factors, initial encounter: Secondary | ICD-10-CM | POA: Diagnosis not present

## 2023-12-19 DIAGNOSIS — Z79899 Other long term (current) drug therapy: Secondary | ICD-10-CM | POA: Insufficient documentation

## 2023-12-19 DIAGNOSIS — S0990XA Unspecified injury of head, initial encounter: Secondary | ICD-10-CM | POA: Diagnosis not present

## 2023-12-19 DIAGNOSIS — T424X5A Adverse effect of benzodiazepines, initial encounter: Secondary | ICD-10-CM | POA: Insufficient documentation

## 2023-12-19 DIAGNOSIS — R0689 Other abnormalities of breathing: Secondary | ICD-10-CM | POA: Diagnosis not present

## 2023-12-19 DIAGNOSIS — R55 Syncope and collapse: Secondary | ICD-10-CM | POA: Diagnosis present

## 2023-12-19 LAB — COMPREHENSIVE METABOLIC PANEL WITH GFR
ALT: 26 U/L (ref 0–44)
AST: 52 U/L — ABNORMAL HIGH (ref 15–41)
Albumin: 3.7 g/dL (ref 3.5–5.0)
Alkaline Phosphatase: 56 U/L (ref 38–126)
Anion gap: 3 — ABNORMAL LOW (ref 5–15)
BUN: 14 mg/dL (ref 8–23)
CO2: 25 mmol/L (ref 22–32)
Calcium: 8.6 mg/dL — ABNORMAL LOW (ref 8.9–10.3)
Chloride: 106 mmol/L (ref 98–111)
Creatinine, Ser: 0.84 mg/dL (ref 0.44–1.00)
GFR, Estimated: >60 mL/min (ref >60)
Glucose, Bld: 134 mg/dL — ABNORMAL HIGH (ref 70–99)
Potassium: 3.6 mmol/L (ref 3.5–5.1)
Sodium: 134 mmol/L — ABNORMAL LOW (ref 135–145)
Total Bilirubin: 1 mg/dL (ref 0.0–1.2)
Total Protein: 7.2 g/dL (ref 6.5–8.1)

## 2023-12-19 LAB — CBC WITH DIFFERENTIAL/PLATELET
Abs Immature Granulocytes: 0.04 10*3/uL (ref 0.00–0.07)
Basophils Absolute: 0.1 10*3/uL (ref 0.0–0.1)
Basophils Relative: 1 %
Eosinophils Absolute: 0.3 10*3/uL (ref 0.0–0.5)
Eosinophils Relative: 3 %
HCT: 44.3 % (ref 36.0–46.0)
Hemoglobin: 15 g/dL (ref 12.0–15.0)
Immature Granulocytes: 0 %
Lymphocytes Relative: 16 %
Lymphs Abs: 1.7 10*3/uL (ref 0.7–4.0)
MCH: 32.1 pg (ref 26.0–34.0)
MCHC: 33.9 g/dL (ref 30.0–36.0)
MCV: 94.7 fL (ref 80.0–100.0)
Monocytes Absolute: 1.1 10*3/uL — ABNORMAL HIGH (ref 0.1–1.0)
Monocytes Relative: 11 %
Neutro Abs: 7.1 10*3/uL (ref 1.7–7.7)
Neutrophils Relative %: 69 %
Platelets: 222 10*3/uL (ref 150–400)
RBC: 4.68 MIL/uL (ref 3.87–5.11)
RDW: 13.5 % (ref 11.5–15.5)
WBC: 10.3 10*3/uL (ref 4.0–10.5)
nRBC: 0 % (ref 0.0–0.2)

## 2023-12-19 LAB — HEPATITIS C ANTIBODY: HCV Ab: NONREACTIVE

## 2023-12-19 LAB — URINALYSIS, COMPLETE (UACMP) WITH MICROSCOPIC
Bilirubin Urine: NEGATIVE
Glucose, UA: NEGATIVE mg/dL
Ketones, ur: 5 mg/dL — AB
Nitrite: NEGATIVE
Protein, ur: 30 mg/dL — AB
Specific Gravity, Urine: 1.024 (ref 1.005–1.030)
Squamous Epithelial / HPF: 0 /HPF (ref 0–5)
pH: 5 (ref 5.0–8.0)

## 2023-12-19 LAB — URINE DRUG SCREEN, QUALITATIVE (ARMC ONLY)
Amphetamines, Ur Screen: NOT DETECTED
Barbiturates, Ur Screen: NOT DETECTED
Benzodiazepine, Ur Scrn: NOT DETECTED
Cannabinoid 50 Ng, Ur ~~LOC~~: NOT DETECTED
Cocaine Metabolite,Ur ~~LOC~~: NOT DETECTED
MDMA (Ecstasy)Ur Screen: NOT DETECTED
Methadone Scn, Ur: NOT DETECTED
Opiate, Ur Screen: POSITIVE — AB
Phencyclidine (PCP) Ur S: NOT DETECTED
Tricyclic, Ur Screen: NOT DETECTED

## 2023-12-19 LAB — HEPATITIS B SURFACE ANTIGEN: Hepatitis B Surface Ag: NONREACTIVE

## 2023-12-19 LAB — RAPID HIV SCREEN (HIV 1/2 AB+AG)
HIV 1/2 Antibodies: NONREACTIVE
HIV-1 P24 Antigen - HIV24: NONREACTIVE

## 2023-12-19 MED ORDER — ONDANSETRON HCL 4 MG PO TABS: 4.0000 mg | ORAL_TABLET | Freq: Four times a day (QID) | ORAL | Status: DC | PRN

## 2023-12-19 MED ORDER — ACETAMINOPHEN 500 MG PO TABS
1000.0000 mg | ORAL_TABLET | Freq: Once | ORAL | Status: AC
  Administered 2023-12-19: 1000 mg via ORAL
  Filled 2023-12-19: qty 2

## 2023-12-19 MED ORDER — GABAPENTIN 100 MG PO CAPS
400.0000 mg | ORAL_CAPSULE | Freq: Every day | ORAL | Status: DC
  Administered 2023-12-19 – 2023-12-20 (×2): 400 mg via ORAL
  Filled 2023-12-19 (×2): qty 4

## 2023-12-19 MED ORDER — DULOXETINE HCL 30 MG PO CPEP
60.0000 mg | ORAL_CAPSULE | Freq: Two times a day (BID) | ORAL | Status: DC
  Administered 2023-12-19 – 2023-12-20 (×2): 60 mg via ORAL
  Filled 2023-12-19 (×2): qty 2

## 2023-12-19 MED ORDER — DIGOXIN 125 MCG PO TABS
0.1250 mg | ORAL_TABLET | Freq: Every day | ORAL | Status: DC
  Administered 2023-12-19 – 2023-12-20 (×2): 0.125 mg via ORAL
  Filled 2023-12-19 (×2): qty 1

## 2023-12-19 MED ORDER — CARIPRAZINE HCL 1.5 MG PO CAPS
1.5000 mg | ORAL_CAPSULE | Freq: Every day | ORAL | Status: DC
  Administered 2023-12-19 – 2023-12-20 (×2): 1.5 mg via ORAL
  Filled 2023-12-19 (×2): qty 1

## 2023-12-19 MED ORDER — LOSARTAN POTASSIUM 25 MG PO TABS
25.0000 mg | ORAL_TABLET | ORAL | Status: DC
  Administered 2023-12-20: 25 mg via ORAL
  Filled 2023-12-19: qty 1

## 2023-12-19 MED ORDER — ACETAMINOPHEN 325 MG PO TABS
650.0000 mg | ORAL_TABLET | Freq: Four times a day (QID) | ORAL | Status: DC | PRN
  Administered 2023-12-19: 650 mg via ORAL
  Filled 2023-12-19: qty 2

## 2023-12-19 MED ORDER — ACETAMINOPHEN 650 MG RE SUPP: 650.0000 mg | Freq: Four times a day (QID) | RECTAL | Status: DC | PRN

## 2023-12-19 MED ORDER — IVABRADINE 2.5 MG HALF TABLET
7.5000 mg | ORAL_TABLET | Freq: Two times a day (BID) | ORAL | Status: DC
  Administered 2023-12-19 – 2023-12-20 (×2): 7.5 mg via ORAL
  Filled 2023-12-19 (×3): qty 3

## 2023-12-19 MED ORDER — SENNOSIDES-DOCUSATE SODIUM 8.6-50 MG PO TABS: 1.0000 | ORAL_TABLET | Freq: Every evening | ORAL | Status: DC | PRN

## 2023-12-19 MED ORDER — ONDANSETRON HCL 4 MG/2ML IJ SOLN: 4.0000 mg | Freq: Four times a day (QID) | INTRAMUSCULAR | Status: DC | PRN

## 2023-12-19 MED ORDER — ATORVASTATIN CALCIUM 20 MG PO TABS
40.0000 mg | ORAL_TABLET | Freq: Every day | ORAL | Status: DC
  Administered 2023-12-19: 40 mg via ORAL
  Filled 2023-12-19: qty 2

## 2023-12-19 MED ORDER — LIDOCAINE 5 % EX PTCH
1.0000 | MEDICATED_PATCH | Freq: Two times a day (BID) | CUTANEOUS | Status: DC | PRN
  Administered 2023-12-20: 1 via TRANSDERMAL
  Filled 2023-12-19 (×2): qty 2

## 2023-12-19 MED ORDER — MELATONIN 5 MG PO TABS
5.0000 mg | ORAL_TABLET | Freq: Every evening | ORAL | Status: DC | PRN
  Administered 2023-12-19: 5 mg via ORAL
  Filled 2023-12-19: qty 1

## 2023-12-19 NOTE — Assessment & Plan Note (Addendum)
 Secondary to psychomotor impairment in setting of recent Ambien  use and with possible concerns for unintentional overdose And in setting of Eliquis  use, concerns of further footballs at home which may lead to intracranial hemorrhage Fall precaution PT, OT, TOC have been consulted on admission

## 2023-12-19 NOTE — Hospital Course (Addendum)
 Sara [name] is a 73 year old female with history of giant cell arteritis, depression, hyperlipidemia, hypertension, insomnia, who presents to the emergency department for chief concerns of altered mental status, concerns of syncope at home.  Vitals in the ED showed T of 98.8, rr of 107, blood pressure 121/79, SpO2 of 91% on room air.  Serum sodium is 137, potassium 3.6, chloride 106, bicarb 25, BUN 14, serum creatinine 0.84, EGFR greater than 60, nonfasting blood glucose 134, WBC 10.3, hemoglobin 15, platelets of 222.  CT head wo contrast: No CT evidence of acute intracranial abnormality.  Mild right forehead contusion.  ED treatment: Acetaminophen  1000 mg p.o. one-time dose.

## 2023-12-19 NOTE — ED Notes (Signed)
 Called Lab to add CBC with Diff.

## 2023-12-19 NOTE — Assessment & Plan Note (Signed)
 PDMP reviewed.

## 2023-12-19 NOTE — Assessment & Plan Note (Signed)
 Patient reports that she takes zolpidem  10 mg daily and gabapentin  400 mg daily
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
## 2023-12-19 NOTE — Assessment & Plan Note (Addendum)
 vs complex sleep behavior in setting of Ambien  use vs concussion secondary to falling in patient with complex sleep disorder and right aka No CT read of swelling or edema Home Eliquis  not resumed on admission AM team to resume when the benefits outweigh the risk

## 2023-12-19 NOTE — Assessment & Plan Note (Signed)
 Patient reports the neural stimulator does not work for her phantom pain
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
## 2023-12-19 NOTE — Plan of Care (Signed)

## 2023-12-19 NOTE — Assessment & Plan Note (Signed)
 Continue outpatient follow-up with neurosurgery

## 2023-12-19 NOTE — Assessment & Plan Note (Addendum)
 Home Ambien  will not be resumed on admission Extensive discussion educating patient on appropriate sleep hygiene done at bedside including: No fluorescent light 2 hours before bed; no screens time 2 hours before bed; do not watch TV while laying in bed, avoid drinking alcohol 2 hours before bed, discontinue caffeine 6 to 8 hours before bedtime.  Only lay in bed for 15 to 30 minutes to try to and if you are not able to fall asleep, get up and walk around in the living room. The first night Sara [name] will have trouble sleeping however avoid taking naps the next day. Melatonin 3 to 5 mg as needed for sleep

## 2023-12-19 NOTE — Progress Notes (Signed)
 Approximately 1720--Pt arrived to unit from ED. Upon arrival, VS obtained and assessment completed by this RN. Telemetry connected and notified. All fall precautions in place.

## 2023-12-19 NOTE — Assessment & Plan Note (Signed)
 Home duloxetine 60 mg daily resumed

## 2023-12-19 NOTE — Assessment & Plan Note (Signed)
 Complex sleep behaviors

## 2023-12-19 NOTE — Assessment & Plan Note (Signed)
Home atorvastatin 40 mg nightly resumed

## 2023-12-19 NOTE — Assessment & Plan Note (Signed)
 Of the right frontal forehead
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
## 2023-12-19 NOTE — Assessment & Plan Note (Signed)
 Home vraylar  1.5 mg daily resumed

## 2023-12-20 DIAGNOSIS — G929 Unspecified toxic encephalopathy: Secondary | ICD-10-CM

## 2023-12-20 DIAGNOSIS — T426X1A Poisoning by other antiepileptic and sedative-hypnotic drugs, accidental (unintentional), initial encounter: Secondary | ICD-10-CM | POA: Diagnosis not present

## 2023-12-20 DIAGNOSIS — G894 Chronic pain syndrome: Secondary | ICD-10-CM | POA: Diagnosis not present

## 2023-12-20 DIAGNOSIS — T148XXA Other injury of unspecified body region, initial encounter: Secondary | ICD-10-CM | POA: Diagnosis not present

## 2023-12-20 LAB — CBC
HCT: 40.9 % (ref 36.0–46.0)
Hemoglobin: 14.1 g/dL (ref 12.0–15.0)
MCH: 31.9 pg (ref 26.0–34.0)
MCHC: 34.5 g/dL (ref 30.0–36.0)
MCV: 92.5 fL (ref 80.0–100.0)
Platelets: 210 10*3/uL (ref 150–400)
RBC: 4.42 MIL/uL (ref 3.87–5.11)
RDW: 13.3 % (ref 11.5–15.5)
WBC: 10 10*3/uL (ref 4.0–10.5)
nRBC: 0 % (ref 0.0–0.2)

## 2023-12-20 LAB — BASIC METABOLIC PANEL WITH GFR
Anion gap: 10 (ref 5–15)
BUN: 12 mg/dL (ref 8–23)
CO2: 23 mmol/L (ref 22–32)
Calcium: 8.4 mg/dL — ABNORMAL LOW (ref 8.9–10.3)
Chloride: 102 mmol/L (ref 98–111)
Creatinine, Ser: 0.74 mg/dL (ref 0.44–1.00)
GFR, Estimated: >60 mL/min (ref >60)
Glucose, Bld: 108 mg/dL — ABNORMAL HIGH (ref 70–99)
Potassium: 3.3 mmol/L — ABNORMAL LOW (ref 3.5–5.1)
Sodium: 135 mmol/L (ref 135–145)

## 2023-12-20 LAB — RPR: RPR Ser Ql: NONREACTIVE

## 2023-12-20 MED ORDER — POTASSIUM CHLORIDE CRYS ER 20 MEQ PO TBCR
40.0000 meq | EXTENDED_RELEASE_TABLET | Freq: Once | ORAL | Status: AC
  Administered 2023-12-20: 40 meq via ORAL
  Filled 2023-12-20: qty 2

## 2023-12-20 MED ORDER — ORAL CARE MOUTH RINSE: 15.0000 mL | OROMUCOSAL | Status: DC | PRN
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
## 2023-12-20 NOTE — Discharge Summary (Signed)
 Physician Discharge Summary   Patient: Sara [name] MRN: [id number] DOB: May 02, 1951  Admit date:     12/19/2023  Discharge date: 12/20/23  Discharge Physician: Jodeane Mulligan   PCP: [facility]   Recommendations at discharge:    Pt to be discharged home with home health PT.   If you experience worsening fever, chills, chest pain, shortness of breath, or other concerning symptoms, please call your PCP or go to the emergency department immediately.  Discharge Diagnoses: Principal Problem:   At risk for falling Active Problems:   GERD (gastroesophageal reflux disease)   Chronic recurrent major depressive disorder (HCC)   Clinical depression   Hyperlipidemia   Gastritis and gastroduodenitis   PVD (peripheral vascular disease) (HCC)   Family history of premature CAD   Depression   PAD (peripheral artery disease) (HCC)   Temporal arteritis (HCC)   S/P AKA (above knee amputation), right (HCC)   Phantom limb pain (HCC)   Chronic pain syndrome   Thoracic back pain   Neuropathic pain   S/P insertion of spinal cord stimulator   TIA (transient ischemic attack)   Concussion   Sleep disorder   At risk for polypharmacy   Insomnia   Traumatic ecchymosis  Resolved Problems:   * No resolved hospital problems. *   Hospital Course:  73 year old female with history of giant cell arteritis, depression, hyperlipidemia, hypertension, insomnia, who presents to the emergency department for chief concerns of altered mental status, concerns of syncope at home.   Assessment and Plan:   Acute toxic encephalopathy secondary to Ambien  use/unintentional overdose -psychomotor impairment in setting of recent Ambien  use and with possible concerns for unintentional overdose.  CT showing no acute hemorrhage or other pathology.  Patient evaluated by PT with recommends to continue home with home health.  Patient feeling markedly improved this morning, ambulatory, eager for  discharge home.  Encourage patient to cut back on her use of Ambien .  Traumatic ecchymosis -Of the right frontal forehead   Insomnia -Extensive discussion educating patient on appropriate sleep hygiene done at bedside including: No fluorescent light 2 hours before bed; no screens time 2 hours before bed; do not watch TV while laying in bed, avoid drinking alcohol 2 hours before bed, discontinue caffeine 6 to 8 hours before bedtime.  Only lay in bed for 15 to 30 minutes to try to and if you are not able to fall asleep, get up and walk around in the living room. The first night he will have trouble sleeping however avoid taking naps the next day. Melatonin 3 to 5 mg as needed for sleep   At risk for polypharmacy -Patient reports that she takes zolpidem  10 mg daily and gabapentin  400 mg daily.  Patient to follow-up with PCP.    S/P insertion of spinal cord stimulator -Continue outpatient follow-up with neurosurgery   Chronic pain syndrome -PDMP reviewed   Phantom limb pain  -Patient reports the neural stimulator does not work for her phantom pain.  Can resume home pain regimen.  Recommend patient follow closely with outpatient, not to mix opioids with Ambien .   Depression -Home duloxetine  60 mg daily resumed   Hyperlipidemia -Home atorvastatin  40 mg nightly resumed   GERD (gastroesophageal reflux disease) Home vraylar  1.5 mg daily resumed  Consultants: None Procedures performed: None Disposition: Home health Diet recommendation:  Discharge Diet Orders (From admission, onward)     Start     Ordered   12/20/23 0000  Diet - low  sodium [name] healthy        12/20/23 1026           Carb modified diet  DISCHARGE MEDICATION: Allergies as of 12/20/2023       Reactions   Hydrocodone -acetaminophen     Other reaction(s): [place]   Diltiazem  Other (See Comments)   headache        Medication List     TAKE these medications    apixaban  2.5 MG Tabs tablet Commonly known  as: Eliquis  Please do not continue until 09/10/22   atorvastatin  40 MG tablet Commonly known as: LIPITOR Take 40 mg by mouth at bedtime.   Blood Pressure Cuff Misc 1 Units by Does not apply route daily as needed.   Calcium  Carb-Cholecalciferol  600-10 MG-MCG Tabs Take 1 tablet by mouth 2 (two) times daily.   cariprazine  1.5 MG capsule Commonly known as: Vraylar  Take 1 capsule (1.5 mg total) by mouth daily.   digoxin  0.125 MG tablet Commonly known as: LANOXIN  Take 0.125 mg by mouth daily.   DULoxetine  60 MG capsule Commonly known as: CYMBALTA  TAKE 1 CAPSULE BY MOUTH TWICE A DAY   gabapentin  400 MG tablet Commonly known as: NEURONTIN  Take 400 mg by mouth daily.   gabapentin  800 MG tablet Commonly known as: NEURONTIN  Take 1 tablet (800 mg total) by mouth 3 (three) times daily.   ivabradine  7.5 MG Tabs tablet Commonly known as: Corlanor Take 1 tablet (7.5 mg total) by mouth 2 (two) times daily with a meal.   lidocaine  5 % Commonly known as: Lidoderm  Place 1 patch onto the skin every 12 (twelve) hours as needed for up to 10 days. Remove & Discard patch after 12 hours of wear each day.   losartan  25 MG tablet Commonly known as: COZAAR  Take 25 mg by mouth every morning.   triamcinolone  ointment 0.5 % Commonly known as: KENALOG  Apply 1 Application topically 2 (two) times daily.         Discharge Exam: Filed Weights   12/19/23 1120 12/19/23 1126  Weight: 59 kg 59 kg    GENERAL:  Alert, pleasant, no acute distress, thin HEENT:  EOMI CARDIOVASCULAR:  RRR, no murmurs appreciated RESPIRATORY:  Clear to auscultation, no wheezing, rales, or rhonchi GASTROINTESTINAL:  Soft, nontender, nondistended EXTREMITIES:  No LE edema bilaterally NEURO:  No new focal deficits appreciated SKIN: Ecchymosis over right eye, scattered ecchymosis PSYCH:  Appropriate mood and affect    Condition at discharge: improving  The results of significant diagnostics from this  hospitalization (including imaging, microbiology, ancillary and laboratory) are listed below for reference.   Imaging Studies: CT Head Wo Contrast Result Date: 12/19/2023 CLINICAL DATA:  Head trauma, bruise to forehead. On anticoagulation. EXAM: CT HEAD WITHOUT CONTRAST TECHNIQUE: Contiguous axial images were obtained from the base of the skull through the vertex without intravenous contrast. RADIATION DOSE REDUCTION: This exam was performed according to the departmental dose-optimization program which includes automated exposure control, adjustment of the mA and/or kV according to patient size and/or use of iterative reconstruction technique. COMPARISON:  MRI head 06/24/2021. FINDINGS: Brain: No acute intracranial hemorrhage. No CT evidence of acute infarct. No edema, mass effect, or midline shift. The basilar cisterns are patent. Ventricles: Prominence of the ventricles suggesting underlying parenchymal volume loss. Vascular: Atherosclerotic calcifications of the carotid siphons. No hyperdense vessel. Skull: No acute or aggressive finding. Orbits: Orbits are symmetric. Sinuses: The visualized paranasal sinuses are clear. Other: Mastoid air cells are clear. Focal soft tissue swelling over the right  aspect of the forehead. IMPRESSION: No CT evidence of acute intracranial abnormality. Mild right forehead contusion. Electronically Signed   By: Denny Flack M.D.   On: 12/19/2023 12:06   DG Ribs Unilateral W/Chest Right Result Date: 12/12/2023 CLINICAL DATA:  Status post fall. EXAM: RIGHT RIBS AND CHEST - 3+ VIEW COMPARISON:  February 21, 2016 FINDINGS: No fracture or other bone lesions are seen involving the ribs. There is no evidence of pneumothorax or pleural effusion. Both lungs are clear. [name] size and mediastinal contours are within normal limits. IMPRESSION: Negative. Electronically Signed   By: Virgle Grime M.D.   On: 12/12/2023 19:48    Microbiology: Results for orders placed or performed during  the hospital encounter of 08/28/23  Surgical pcr screen     Status: None   Collection Time: 08/17/23  1:53 PM   Specimen: Nasal Mucosa; Nasal Swab  Result Value Ref Range Status   MRSA, PCR NEGATIVE NEGATIVE Final   Staphylococcus aureus NEGATIVE NEGATIVE Final    Comment: (NOTE) The Xpert SA Assay (FDA approved for NASAL specimens in patients 3 years of age and older), is one component of a comprehensive surveillance program. It is not intended to diagnose infection nor to guide or monitor treatment. Performed at [facility], [address] [place], [place] [address]     Labs: CBC: Recent Labs  Lab 12/19/23 1129 12/20/23 0428  WBC 10.3 10.0  NEUTROABS 7.1  --   HGB 15.0 14.1  HCT 44.3 40.9  MCV 94.7 92.5  PLT 222 210   Basic Metabolic Panel: Recent Labs  Lab 12/19/23 1129 12/20/23 0428  NA 134* 135  K 3.6 3.3*  CL 106 102  CO2 25 23  GLUCOSE 134* 108*  BUN 14 12  CREATININE 0.84 0.74  CALCIUM  8.6* 8.4*   Liver Function Tests: Recent Labs  Lab 12/19/23 1129  AST 52*  ALT 26  ALKPHOS 56  BILITOT 1.0  PROT 7.2  ALBUMIN 3.7   CBG: No results for input(s): "GLUCAP" in the last 168 hours.  Discharge time spent: 26 minutes.  Signed: Jodeane Mulligan, DO [facility] 12/20/2023

## 2023-12-20 NOTE — TOC Transition Note (Addendum)
 Transition of Care [facility]) - Discharge Note   Patient Details  Name: Sara [name] MRN: [id number] Date of Birth: [birth year]  [facility] CM/SW Contact:  Crayton Docker, RN Phone Number: 12/20/2023, 4:08 PM   Clinical Narrative:     Discharge orders noted for discharge to home with home health. CM call to patient's phone: [phone] regarding home health orders. No answer, CM left message for return call.    CM call to [facility], phone: [phone] regarding home health PT. Per [facility], agency can accept for start of care tomorrow, 12/21/2023 or 12/22/2023.   CM call to patient's brother, Fredrik Jensen, phone: [phone] regarding [facility] and start of care within 24-48 hours. Per patient's brother, verbalized understanding and agreement.  Patient Goals and CMS Choice    TBD  Discharge Placement      TBD           Social Drivers of Health (SDOH) Interventions SDOH Screenings   Food Insecurity: No Food Insecurity (12/19/2023)  Housing: Low Risk  (12/19/2023)  Transportation Needs: No Transportation Needs (12/19/2023)  Utilities: Not At Risk (12/19/2023)  Depression (PHQ2-9): Low Risk  (05/22/2023)  Financial Resource Strain: Patient Declined (07/16/2023)   Received from [facility]  Social Connections: Socially Isolated (12/19/2023)  Tobacco Use: Medium Risk (12/19/2023)     Readmission Risk Interventions     No data to display
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
## 2023-12-26 ENCOUNTER — Ambulatory Visit
Admission: RE | Admit: 2023-12-26 | Discharge: 2023-12-26 | Disposition: A | Discharge status: Home / Self Care | Attending: Cardiology | Admitting: Cardiology

## 2023-12-26 ENCOUNTER — Ambulatory Visit
Admission: RE | Admit: 2023-12-26 | Discharge: 2023-12-26 | Disposition: A | Discharge status: Home / Self Care | Source: Ambulatory Visit | Attending: Cardiology

## 2023-12-26 ENCOUNTER — Encounter: Payer: Self-pay | Admitting: Cardiology

## 2023-12-26 ENCOUNTER — Ambulatory Visit (INDEPENDENT_AMBULATORY_CARE_PROVIDER_SITE_OTHER): Admitting: Cardiology

## 2023-12-26 VITALS — BP 160/80 | HR 94 | Ht 63.0 in | Wt 129.0 lb

## 2023-12-26 DIAGNOSIS — M79604 Pain in right leg: Secondary | ICD-10-CM | POA: Diagnosis not present

## 2023-12-26 DIAGNOSIS — S72001A Fracture of unspecified part of neck of right femur, initial encounter for closed fracture: Secondary | ICD-10-CM | POA: Diagnosis not present

## 2023-12-26 DIAGNOSIS — R7303 Prediabetes: Secondary | ICD-10-CM

## 2023-12-26 DIAGNOSIS — Z0130 Encounter for examination of blood pressure without abnormal findings: Secondary | ICD-10-CM

## 2023-12-26 NOTE — Progress Notes (Signed)
 Established Patient Office Visit  Subjective:  Patient ID: Sara [name], female    DOB: August 09, 1951  Age: 73 y.o. MRN: [id number]  Chief Complaint  Patient presents with   Leg Pain    Right leg pain    Patient in office for an acute visit, complaining of right leg pain. Patient fell 3 separate times about 2 weeks ago, went to ED, no imaging done of right leg. Patient is S/P AKA (above knee amputation), right in 2022. Patient comes in today complaining of right leg pain and edema of stump. Unable to use prosthesis. Currently using a walker to ambulate. Will get an xray of right leg for further evaluation.     No other concerns at this time.   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 Prescilla Brod   Vertigo    Last episode approx Aug 2015   Wears dentures    full upper    Past Surgical History:  Procedure Laterality Date   ADENOIDECTOMY     AMPUTATION Right 08/11/2020   Procedure: AMPUTATION ABOVE KNEE;  Surgeon: Jackquelyn Mass, MD;  Location: [facility];  Service: Vascular;  Laterality: Right;   ARTERY BIOPSY Right 07/14/2019   Procedure: BIOPSY TEMPORAL ARTERY;  Surgeon: Jackquelyn Mass, MD;  Location: [facility];  Service: Vascular;  Laterality: Right;   BREAST CYST ASPIRATION Left    CARDIAC CATHETERIZATION  02/15/2017   [facility]   COLONOSCOPY WITH PROPOFOL  N/A 10/22/2017   Procedure: COLONOSCOPY WITH PROPOFOL ;  Surgeon: Marnee Sink, MD;  Location: [facility];  Service: Endoscopy;  Laterality: N/A;  specimens not taken--pt on  Plavix  will be brought back in after 7 days off med   COLONOSCOPY WITH PROPOFOL  N/A 11/05/2017   Procedure: COLONOSCOPY WITH PROPOFOL ;  Surgeon: Marnee Sink, MD;  Location: [facility];  Service: Endoscopy;  Laterality: N/A;   COLONOSCOPY WITH PROPOFOL  N/A 11/13/2022   Procedure: COLONOSCOPY WITH PROPOFOL ;  Surgeon: Marnee Sink, MD;  Location: [facility];  Service: Endoscopy;  Laterality: N/A;   ESOPHAGOGASTRODUODENOSCOPY N/A 12/21/2014   Procedure: ESOPHAGOGASTRODUODENOSCOPY (EGD);  Surgeon: Marnee Sink, MD;  Location: [facility];  Service: Gastroenterology;  Laterality: N/A;   ESOPHAGOGASTRODUODENOSCOPY (EGD) WITH PROPOFOL  N/A 02/08/2016   Procedure: ESOPHAGOGASTRODUODENOSCOPY (EGD) WITH PROPOFOL ;  Surgeon: Marnee Sink, MD;  Location: [facility];  Service: Endoscopy;  Laterality: N/A;   FASCIOTOMY Right 05/30/2019   Procedure: FASCIOTOMY;  Surgeon: Jackquelyn Mass, MD;  Location: [facility];  Service: Vascular;  Laterality: Right;   FASCIOTOMY CLOSURE Right 06/04/2019   Procedure: FASCIOTOMY CLOSURE;  Surgeon: Jackquelyn Mass, MD;  Location: [facility];  Service: Vascular;  Laterality: Right;   HEMORROIDECTOMY  2014   LOWER EXTREMITY ANGIOGRAPHY Right 05/30/2019   Procedure: LOWER EXTREMITY ANGIOGRAPHY;  Surgeon: Jackquelyn Mass, MD;  Location: [facility];  Service: Cardiovascular;  Laterality: Right;   LOWER EXTREMITY ANGIOGRAPHY Right 07/02/2020   Procedure:  LOWER EXTREMITY ANGIOGRAPHY;  Surgeon: Jackquelyn Mass, MD;  Location: [facility];  Service: Cardiovascular;  Laterality: Right;   PERIPHERAL VASCULAR CATHETERIZATION N/A 02/09/2016   Procedure: Abdominal Aortogram w/Lower Extremity;  Surgeon: Jackquelyn Mass, MD;  Location: [facility];  Service: Cardiovascular;  Laterality: N/A;   PERIPHERAL VASCULAR CATHETERIZATION Right 02/10/2016   Procedure: Lower Extremity Angiography;  Surgeon: Jackquelyn Mass, MD;   Location: [facility];  Service: Cardiovascular;  Laterality: Right;   POLYPECTOMY  11/05/2017   Procedure: POLYPECTOMY;  Surgeon: Marnee Sink, MD;  Location: [facility];  Service: Endoscopy;;   POLYPECTOMY  11/13/2022   Procedure: POLYPECTOMY;  Surgeon: Marnee Sink, MD;  Location: [facility];  Service: Endoscopy;;   Sarcoidos     bone   SHOULDER ARTHROSCOPY WITH ROTATOR CUFF REPAIR AND SUBACROMIAL DECOMPRESSION Right 02/27/2020   Procedure: RIGHT SHOULDER ARTHROSCOPY SUBACROMIAL DECOMPRESSION, DISTAL CLAVICLE EXCISION AND MINI-OPEN ROTATOR CUFF REPAIR;  Surgeon: Rande Bushy, MD;  Location: [facility];  Service: Orthopedics;  Laterality: Right;   THORACIC LAMINECTOMY FOR SPINAL CORD STIMULATOR N/A 08/28/2023   Procedure: THORACIC LAMINECTOMY FOR SPINAL CORD STIMULATOR PLACEMENT (MEDTRONIC);  Surgeon: Carroll Clamp, MD;  Location: [facility];  Service: Neurosurgery;  Laterality: N/A;   TONSILLECTOMY     VASCULAR SURGERY  [id number]   Fem-Pop Bypass    Social History   Socioeconomic History   Marital status: Single    Spouse name: Not on file   Number of children: Not on file   Years of education: Not on file   Highest education level: Not on file  Occupational History   Not on file  Tobacco Use   Smoking status: Former    Current packs/day: 0.00    Average packs/day: 2.0 packs/day for 25.0 years (50.0 ttl pk-yrs)    Types: Cigarettes    Start date: 08/21/1966    Quit date: 08/22/1991    Years since quitting: 32.3   Smokeless tobacco: Never  Vaping Use   Vaping status: Never Used  Substance and Sexual Activity   Alcohol use: No    Alcohol/week: 0.0 standard drinks of alcohol   Drug use: Never   Sexual activity: Not Currently  Other Topics Concern   Not on file  Social History Narrative   Not on file   Social Drivers of Health   Financial Resource Strain: Patient Declined (07/16/2023)   Received from [facility]   Overall  Financial Resource Strain (CARDIA)    Difficulty of Paying Living Expenses: Patient declined  Food Insecurity: No Food Insecurity (12/19/2023)   Hunger Vital Sign    Worried About Running Out of Food in the Last Year: Never true    Ran Out of Food in the Last Year: Never true  Transportation Needs: No Transportation Needs (12/19/2023)   PRAPARE - Administrator, Civil Service (Medical): No    Lack of Transportation (Non-Medical): No  Physical Activity: Not on file  Stress: Not on file  Social Connections: Socially Isolated (12/19/2023)   Social Connection and Isolation Panel [NHANES]    Frequency of Communication with Friends and Family: Three times a week    Frequency of Social Gatherings with Friends and Family: Twice a week    Attends Religious Services: Never    Database administrator or Organizations: No    Attends Banker Meetings: Never    Marital Status: Never married  Intimate Partner Violence: Not At  Risk (12/19/2023)   Humiliation, Afraid, Rape, and Kick questionnaire    Fear of Current or Ex-Partner: No    Emotionally Abused: No    Physically Abused: No    Sexually Abused: No    Family History  Problem Relation Age of Onset   CVA Mother    Heart attack Mother    Cancer Father        colon cancer   Colon cancer Father    Heart disease Maternal Uncle    Breast cancer Neg Hx     Allergies  Allergen Reactions   Hydrocodone -Acetaminophen      Other reaction(s): Flushing   Diltiazem  Other (See Comments)    headache    Outpatient Medications Prior to Visit  Medication Sig   apixaban  (ELIQUIS ) 2.5 MG TABS tablet Please do not continue until 09/10/22   atorvastatin  (LIPITOR) 40 MG tablet Take 40 mg by mouth at bedtime.    Blood Pressure Monitoring (BLOOD PRESSURE CUFF) MISC 1 Units by Does not apply route daily as needed.   Calcium  Carb-Cholecalciferol  600-10 MG-MCG TABS Take 1 tablet by mouth 2 (two) times daily.   cariprazine  (VRAYLAR ) 1.5  MG capsule Take 1 capsule (1.5 mg total) by mouth daily.   digoxin  (LANOXIN ) 0.125 MG tablet Take 0.125 mg by mouth daily.   DULoxetine  (CYMBALTA ) 60 MG capsule TAKE 1 CAPSULE BY MOUTH TWICE A DAY   gabapentin  (NEURONTIN ) 400 MG tablet Take 400 mg by mouth daily.   ivabradine  (CORLANOR) 7.5 MG TABS tablet Take 1 tablet (7.5 mg total) by mouth 2 (two) times daily with a meal.   losartan  (COZAAR ) 25 MG tablet Take 25 mg by mouth every morning.    triamcinolone  ointment (KENALOG ) 0.5 % Apply 1 Application topically 2 (two) times daily.   gabapentin  (NEURONTIN ) 800 MG tablet Take 1 tablet (800 mg total) by mouth 3 (three) times daily. (Patient not taking: Reported on 12/19/2023)   No facility-administered medications prior to visit.    Review of Systems  Constitutional: Negative.   HENT: Negative.    Eyes: Negative.   Respiratory: Negative.  Negative for shortness of breath.   Cardiovascular: Negative.  Negative for chest pain.  Gastrointestinal: Negative.  Negative for abdominal pain, constipation and diarrhea.  Genitourinary: Negative.   Musculoskeletal:  Negative for joint pain and myalgias.  Skin: Negative.   Neurological: Negative.  Negative for dizziness and headaches.  Endo/Heme/Allergies: Negative.   All other systems reviewed and are negative.      Objective:   BP (!) 160/80   Pulse 94   Ht 5\' 3"  (1.6 m)   Wt 129 lb (58.5 kg)   SpO2 92%   BMI 22.85 kg/m   Vitals:   12/26/23 0955  BP: (!) 160/80  Pulse: 94  Height: 5\' 3"  (1.6 m)  Weight: 129 lb (58.5 kg)  SpO2: 92%  BMI (Calculated): 22.86    Physical Exam Vitals and nursing note reviewed.  Constitutional:      Appearance: Normal appearance. She is normal weight.  HENT:     Head: Normocephalic and atraumatic.     Nose: Nose normal.     Mouth/Throat:     Mouth: Mucous membranes are moist.  Eyes:     Extraocular Movements: Extraocular movements intact.     Conjunctiva/sclera: Conjunctivae normal.      Pupils: Pupils are equal, round, and reactive to light.  Cardiovascular:     Rate and Rhythm: Normal rate and regular rhythm.  Pulses: Normal pulses.     Heart sounds: Normal heart sounds.  Pulmonary:     Effort: Pulmonary effort is normal.     Breath sounds: Normal breath sounds.  Abdominal:     General: Abdomen is flat. Bowel sounds are normal.     Palpations: Abdomen is soft.  Musculoskeletal:        General: Normal range of motion.     Cervical back: Normal range of motion.  Skin:    General: Skin is warm and dry.  Neurological:     General: No focal deficit present.     Mental Status: She is alert and oriented to person, place, and time.  Psychiatric:        Mood and Affect: Mood normal.        Behavior: Behavior normal.        Thought Content: Thought content normal.        Judgment: Judgment normal.      No results found for any visits on 12/26/23.  Recent Results (from the past 2160 hours)  Rapid HIV screen     Status: None   Collection Time: 12/19/23 11:29 AM  Result Value Ref Range   HIV-1 P24 Antigen - HIV24 NON REACTIVE NON REACTIVE    Comment: (NOTE) Detection of p24 may be inhibited by biotin in the sample, causing false negative results in acute infection.    HIV 1/2 Antibodies NON REACTIVE NON REACTIVE   Interpretation (HIV Ag Ab)      A non reactive test result means that HIV 1 or HIV 2 antibodies and HIV 1 p24 antigen were not detected in the specimen.    Comment: Performed at [facility], [address] [place], [place] [address]  Comprehensive metabolic panel     Status: Abnormal   Collection Time: 12/19/23 11:29 AM  Result Value Ref Range   Sodium 134 (L) 135 - 145 mmol/L   Potassium 3.6 3.5 - 5.1 mmol/L   Chloride 106 98 - 111 mmol/L   CO2 25 22 - 32 mmol/L   Glucose, Bld 134 (H) 70 - 99 mg/dL    Comment: Glucose reference range applies only to samples taken after fasting for at least 8 hours.   BUN 14 8 - 23 mg/dL    Creatinine, Ser 9.62 0.44 - 1.00 mg/dL   Calcium  8.6 (L) 8.9 - 10.3 mg/dL   Total Protein 7.2 6.5 - 8.1 g/dL   Albumin 3.7 3.5 - 5.0 g/dL   AST 52 (H) 15 - 41 U/L   ALT 26 0 - 44 U/L   Alkaline Phosphatase 56 38 - 126 U/L   Total Bilirubin 1.0 0.0 - 1.2 mg/dL   GFR, Estimated >95 >28 mL/min    Comment: (NOTE) Calculated using the CKD-EPI Creatinine Equation (2021)    Anion gap 3 (L) 5 - 15    Comment: Performed at [facility], [address] [place], [place] [address]  Hepatitis C antibody     Status: None   Collection Time: 12/19/23 11:29 AM  Result Value Ref Range   HCV Ab NON REACTIVE NON REACTIVE    Comment: (NOTE) Nonreactive HCV antibody screen is consistent with no HCV infections,  unless recent infection is suspected or other evidence exists to indicate HCV infection.  Performed at [facility], [address] [place], [place] [address]   Hepatitis B surface antigen     Status: None   Collection Time: 12/19/23 11:29 AM  Result Value Ref Range   Hepatitis B Surface Ag NON REACTIVE NON REACTIVE    Comment: Performed at [facility], [address] [place], [place] [address]  RPR     Status: None   Collection Time: 12/19/23 11:29 AM  Result Value Ref Range   RPR Ser Ql NON REACTIVE NON REACTIVE    Comment: Performed at [facility], [address], [place], [place] [address]  CBC with Differential     Status: Abnormal   Collection Time: 12/19/23 11:29 AM  Result Value Ref Range   WBC 10.3 4.0 - 10.5 K/uL   RBC 4.68 3.87 - 5.11 MIL/uL   Hemoglobin 15.0 12.0 - 15.0 g/dL   HCT 09.8 11.9 - 14.7 %   MCV 94.7 80.0 - 100.0 fL   MCH 32.1 26.0 - 34.0 pg   MCHC 33.9 30.0 - 36.0 g/dL   RDW 82.9 56.2 - 13.0 %   Platelets 222 150 - 400 K/uL   nRBC 0.0 0.0 - 0.2 %   Neutrophils Relative % 69 %   Neutro Abs 7.1 1.7 - 7.7 K/uL   Lymphocytes Relative 16 %   Lymphs Abs 1.7 0.7 - 4.0 K/uL   Monocytes Relative 11 %   Monocytes Absolute 1.1 (H)  0.1 - 1.0 K/uL   Eosinophils Relative 3 %   Eosinophils Absolute 0.3 0.0 - 0.5 K/uL   Basophils Relative 1 %   Basophils Absolute 0.1 0.0 - 0.1 K/uL   Immature Granulocytes 0 %   Abs Immature Granulocytes 0.04 0.00 - 0.07 K/uL    Comment: Performed at [facility], [address] [place], [place] [address]  Urinalysis, Complete w Microscopic -Urine, Clean Catch     Status: Abnormal   Collection Time: 12/19/23  3:33 PM  Result Value Ref Range   Color, Urine Joyanne Eddinger (A) YELLOW    Comment: BIOCHEMICALS MAY BE AFFECTED BY COLOR   APPearance HAZY (A) CLEAR   Specific Gravity, Urine 1.024 1.005 - 1.030   pH 5.0 5.0 - 8.0   Glucose, UA NEGATIVE NEGATIVE mg/dL   Hgb urine dipstick SMALL (A) NEGATIVE   Bilirubin Urine NEGATIVE NEGATIVE   Ketones, ur 5 (A) NEGATIVE mg/dL   Protein, ur 30 (A) NEGATIVE mg/dL   Nitrite NEGATIVE NEGATIVE   Leukocytes,Ua MODERATE (A) NEGATIVE   RBC / HPF 6-10 0 - 5 RBC/hpf   WBC, UA 11-20 0 - 5 WBC/hpf   Bacteria, UA RARE (A) NONE SEEN   Squamous Epithelial / HPF 0 0 - 5 /HPF   Mucus PRESENT     Comment: Performed at [facility], [address] [place], [place] [address]  Urine Drug Screen, Qualitative ([facility] only)     Status: Abnormal   Collection Time: 12/19/23  3:33 PM  Result Value Ref Range   Tricyclic, Ur Screen NONE DETECTED NONE DETECTED   Amphetamines, Ur Screen NONE DETECTED NONE DETECTED   MDMA (Ecstasy)Ur Screen NONE DETECTED NONE DETECTED   Cocaine Metabolite,Ur [place] NONE DETECTED NONE DETECTED   Opiate, Ur Screen POSITIVE (A) NONE DETECTED   Phencyclidine (PCP) Ur S NONE DETECTED NONE DETECTED   Cannabinoid 50 Ng, Ur [place] NONE DETECTED NONE DETECTED   Barbiturates, Ur Screen NONE DETECTED NONE DETECTED   Benzodiazepine, Ur Scrn NONE DETECTED NONE DETECTED   Methadone Scn, Ur NONE DETECTED NONE DETECTED    Comment: (NOTE) Tricyclics + metabolites, urine    Cutoff 1000 ng/mL Amphetamines + metabolites, urine  Cutoff 1000  ng/mL MDMA (Ecstasy), urine              Cutoff 500 ng/mL Cocaine Metabolite, urine          Cutoff 300 ng/mL Opiate + metabolites, urine        Cutoff 300 ng/mL Phencyclidine (PCP), urine         Cutoff 25 ng/mL Cannabinoid, urine                 Cutoff 50 ng/mL Barbiturates + metabolites, urine  Cutoff 200 ng/mL Benzodiazepine, urine              Cutoff 200 ng/mL Methadone, urine                   Cutoff 300 ng/mL  The urine drug screen provides only a preliminary, unconfirmed analytical test result and should not be used for non-medical purposes. Clinical consideration and professional judgment should be applied to any positive drug screen result due to possible interfering substances. A more specific alternate chemical method must be used in order to obtain a confirmed analytical result. Gas chromatography / mass spectrometry (GC/MS) is the preferred confirm atory method. Performed at [facility], [address] [place], [place] [address]   Basic metabolic panel     Status: Abnormal   Collection Time: 12/20/23  4:28 AM  Result Value Ref Range   Sodium 135 135 - 145 mmol/L   Potassium 3.3 (L) 3.5 - 5.1 mmol/L   Chloride 102 98 - 111 mmol/L   CO2 23 22 - 32 mmol/L   Glucose, Bld 108 (H) 70 - 99 mg/dL    Comment: Glucose reference range applies only to samples taken after fasting for at least 8 hours.   BUN 12 8 - 23 mg/dL   Creatinine, Ser 6.04 0.44 - 1.00 mg/dL   Calcium  8.4 (L) 8.9 - 10.3 mg/dL   GFR, Estimated >54 >09 mL/min    Comment: (NOTE) Calculated using the CKD-EPI Creatinine Equation (2021)    Anion gap 10 5 - 15    Comment: Performed at [facility], [address] [place], [place] [address]  CBC     Status: None   Collection Time: 12/20/23  4:28 AM  Result Value Ref Range   WBC 10.0 4.0 - 10.5 K/uL   RBC 4.42 3.87 - 5.11 MIL/uL   Hemoglobin 14.1 12.0 - 15.0 g/dL   HCT 47.8 29.5 - 62.1 %   MCV 92.5 80.0 - 100.0 fL   MCH 31.9 26.0 -  34.0 pg   MCHC 34.5 30.0 - 36.0 g/dL   RDW 30.8 65.7 - 84.6 %   Platelets 210 150 - 400 K/uL   nRBC 0.0 0.0 - 0.2 %    Comment: Performed at [facility], [address], [place], [place] [address]      Assessment & Plan:  Xray of right leg.   Problem List Items Addressed This Visit       Other   Right leg pain - Primary   Relevant Orders   DG FEMUR, MIN 2 VIEWS RIGHT    Return if symptoms worsen or fail to improve, for as scheduled.   Total time spent: 25 minutes  [employer], NP  12/26/2023   This document may have been prepared by Dragon Voice Recognition software and as such may include unintentional dictation errors.

## 2024-01-02 DIAGNOSIS — H26493 Other secondary cataract, bilateral: Secondary | ICD-10-CM | POA: Diagnosis not present

## 2024-01-02 DIAGNOSIS — H353131 Nonexudative age-related macular degeneration, bilateral, early dry stage: Secondary | ICD-10-CM | POA: Diagnosis not present

## 2024-01-02 DIAGNOSIS — M316 Other giant cell arteritis: Secondary | ICD-10-CM | POA: Diagnosis not present

## 2024-01-07 ENCOUNTER — Ambulatory Visit: Payer: Self-pay | Admitting: Cardiology

## 2024-01-08 ENCOUNTER — Encounter (INDEPENDENT_AMBULATORY_CARE_PROVIDER_SITE_OTHER): Payer: Self-pay

## 2024-01-21 ENCOUNTER — Ambulatory Visit: Payer: Medicare PPO | Admitting: Cardiology

## 2024-02-05 ENCOUNTER — Ambulatory Visit: Admitting: Cardiovascular Disease

## 2024-02-07 ENCOUNTER — Ambulatory Visit: Admitting: Cardiology

## 2024-02-08 ENCOUNTER — Ambulatory Visit: Admitting: Cardiovascular Disease

## 2024-02-08 ENCOUNTER — Encounter: Payer: Self-pay | Admitting: Cardiovascular Disease

## 2024-02-08 VITALS — BP 102/62 | HR 78 | Ht 63.0 in | Wt 138.0 lb

## 2024-02-08 DIAGNOSIS — I10 Essential (primary) hypertension: Secondary | ICD-10-CM

## 2024-02-08 DIAGNOSIS — E782 Mixed hyperlipidemia: Secondary | ICD-10-CM | POA: Diagnosis not present

## 2024-02-08 DIAGNOSIS — Z89611 Acquired absence of right leg above knee: Secondary | ICD-10-CM

## 2024-02-08 DIAGNOSIS — I340 Nonrheumatic mitral (valve) insufficiency: Secondary | ICD-10-CM

## 2024-02-08 DIAGNOSIS — I4711 Inappropriate sinus tachycardia, so stated: Secondary | ICD-10-CM

## 2024-02-08 NOTE — Progress Notes (Addendum)
 Cardiology Office Note   Date:  02/08/2024   ID:  Sara, [name] December 03, 1950, MRN [id number]  PCP:  [facility]  Cardiologist:  Debborah Fairly, MD      History of Present Illness: Sara [name] is a 73 y.o. female who presents for  Chief Complaint  Patient presents with   Follow-up    3 month follow up    Feeling fine,no pain.      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 Prescilla Brod   Vertigo    Last episode approx Aug 2015   Wears dentures    full upper     Past Surgical History:  Procedure Laterality Date   ADENOIDECTOMY     AMPUTATION Right 08/11/2020   Procedure: AMPUTATION ABOVE KNEE;  Surgeon: Jackquelyn Mass, MD;  Location: [facility];  Service: Vascular;  Laterality: Right;   ARTERY BIOPSY Right 07/14/2019   Procedure: BIOPSY TEMPORAL ARTERY;  Surgeon: Jackquelyn Mass, MD;  Location: [facility];  Service: Vascular;  Laterality: Right;   BREAST CYST ASPIRATION Left    CARDIAC CATHETERIZATION  02/15/2017   [facility]   COLONOSCOPY WITH PROPOFOL  N/A 10/22/2017   Procedure: COLONOSCOPY WITH PROPOFOL ;  Surgeon: Marnee Sink, MD;  Location: [facility];  Service: Endoscopy;  Laterality: N/A;  specimens not taken--pt on Plavix  will be brought back in after 7 days off med   COLONOSCOPY WITH PROPOFOL  N/A 11/05/2017   Procedure: COLONOSCOPY WITH PROPOFOL ;  Surgeon: Marnee Sink, MD;  Location: [facility];  Service: Endoscopy;  Laterality: N/A;   COLONOSCOPY WITH PROPOFOL  N/A 11/13/2022    Procedure: COLONOSCOPY WITH PROPOFOL ;  Surgeon: Marnee Sink, MD;  Location: [facility];  Service: Endoscopy;  Laterality: N/A;   ESOPHAGOGASTRODUODENOSCOPY N/A 12/21/2014   Procedure: ESOPHAGOGASTRODUODENOSCOPY (EGD);  Surgeon: Marnee Sink, MD;  Location: [facility];  Service: Gastroenterology;  Laterality: N/A;   ESOPHAGOGASTRODUODENOSCOPY (EGD) WITH PROPOFOL  N/A 02/08/2016   Procedure: ESOPHAGOGASTRODUODENOSCOPY (EGD) WITH PROPOFOL ;  Surgeon: Marnee Sink, MD;  Location: [facility];  Service: Endoscopy;  Laterality: N/A;   FASCIOTOMY Right 05/30/2019   Procedure: FASCIOTOMY;  Surgeon: Jackquelyn Mass, MD;  Location: [facility];  Service: Vascular;  Laterality: Right;   FASCIOTOMY CLOSURE Right 06/04/2019   Procedure: FASCIOTOMY CLOSURE;  Surgeon: Jackquelyn Mass, MD;  Location: [facility];  Service: Vascular;  Laterality: Right;   HEMORROIDECTOMY  2014   LOWER EXTREMITY ANGIOGRAPHY Right 05/30/2019   Procedure: LOWER EXTREMITY ANGIOGRAPHY;  Surgeon: Jackquelyn Mass, MD;  Location: [facility];  Service: Cardiovascular;  Laterality: Right;   LOWER EXTREMITY ANGIOGRAPHY Right 07/02/2020   Procedure: LOWER EXTREMITY ANGIOGRAPHY;  Surgeon: Jackquelyn Mass, MD;  Location: [facility];  Service: Cardiovascular;  Laterality: Right;   PERIPHERAL VASCULAR CATHETERIZATION N/A 02/09/2016   Procedure: Abdominal Aortogram w/Lower Extremity;  Surgeon: Jackquelyn Mass, MD;  Location: [facility];  Service: Cardiovascular;  Laterality: N/A;   PERIPHERAL VASCULAR CATHETERIZATION Right 02/10/2016   Procedure: Lower Extremity Angiography;  Surgeon: Jackquelyn Mass, MD;  Location: [facility];  Service: Cardiovascular;  Laterality: Right;   POLYPECTOMY  11/05/2017   Procedure: POLYPECTOMY;  Surgeon: Marnee Sink, MD;  Location: [facility];  Service: Endoscopy;;   POLYPECTOMY  11/13/2022   Procedure: POLYPECTOMY;  Surgeon: Marnee Sink, MD;  Location: [facility];  Service: Endoscopy;;   Sarcoidos     bone   SHOULDER ARTHROSCOPY WITH ROTATOR CUFF REPAIR AND SUBACROMIAL DECOMPRESSION Right 02/27/2020   Procedure: RIGHT SHOULDER ARTHROSCOPY SUBACROMIAL DECOMPRESSION, DISTAL CLAVICLE EXCISION AND MINI-OPEN ROTATOR CUFF REPAIR;  Surgeon: Rande Bushy, MD;  Location: [facility];  Service: Orthopedics;  Laterality: Right;   THORACIC LAMINECTOMY FOR SPINAL CORD STIMULATOR N/A 08/28/2023   Procedure: THORACIC LAMINECTOMY FOR SPINAL CORD STIMULATOR PLACEMENT (MEDTRONIC);  Surgeon: Carroll Clamp, MD;  Location: [facility];  Service: Neurosurgery;  Laterality: N/A;   TONSILLECTOMY     VASCULAR SURGERY  [id number]   Fem-Pop Bypass     Current Outpatient Medications  Medication Sig Dispense Refill   apixaban  (ELIQUIS ) 2.5 MG TABS tablet Please do not continue until 09/10/22 60 tablet 0   atorvastatin  (LIPITOR) 40 MG tablet Take 40 mg by mouth at bedtime.      Blood Pressure Monitoring (BLOOD PRESSURE CUFF) MISC 1 Units by Does not apply route daily as needed. 1 each 0   Calcium  Carb-Cholecalciferol  600-10 MG-MCG TABS Take 1 tablet by mouth 2 (two) times daily.     cariprazine  (VRAYLAR ) 1.5 MG capsule Take 1 capsule (1.5 mg total) by mouth daily. 28 capsule    digoxin  (LANOXIN ) 0.125 MG tablet Take 0.125 mg by mouth daily.     DULoxetine  (CYMBALTA ) 60 MG capsule TAKE 1 CAPSULE BY MOUTH TWICE A DAY 180 capsule 3   gabapentin  (NEURONTIN ) 400 MG tablet Take 400 mg by mouth daily.     ivabradine  (CORLANOR) 7.5 MG TABS tablet Take 1 tablet (7.5 mg total) by mouth 2 (two) times daily with a meal. 60 tablet 3   losartan  (COZAAR ) 25 MG tablet Take 25 mg by mouth every morning.      triamcinolone  ointment (KENALOG ) 0.5 % Apply 1 Application topically 2 (two) times daily. 30 g 3   No current facility-administered medications for this visit.    Allergies:   Patient has no active allergies.    Social History:   reports that  she quit smoking about 32 years ago. Her smoking use included cigarettes. She started smoking about 57 years ago. She has a 50 pack-year smoking history. She has never used smokeless tobacco. She reports that she does not drink alcohol and does not use drugs.   Family History:  family history includes CVA in her mother; Cancer in her father; Colon cancer in her father; Heart attack in her mother; Heart disease in her maternal uncle.    ROS:     Review of Systems  Constitutional: Negative.   HENT: Negative.    Eyes: Negative.   Respiratory: Negative.    Gastrointestinal: Negative.   Genitourinary: Negative.   Musculoskeletal: Negative.   Skin: Negative.   Neurological: Negative.   Endo/Heme/Allergies: Negative.   Psychiatric/Behavioral: Negative.    All other systems reviewed and are negative.     All other systems are reviewed and negative.    PHYSICAL EXAM: VS:  BP 102/62   Pulse 78   Ht 5'  3 (1.6 m)   Wt 138 lb (62.6 kg)   SpO2 95%   BMI 24.45 kg/m  , BMI Body mass index is 24.45 kg/m. Last weight:  Wt Readings from Last 3 Encounters:  02/08/24 138 lb (62.6 kg)  12/26/23 129 lb (58.5 kg)  12/12/23 130 lb (59 kg)     Physical Exam Constitutional:      Appearance: Normal appearance.   Cardiovascular:     Rate and Rhythm: Normal rate and regular rhythm.     Heart sounds: Normal heart sounds.  Pulmonary:     Effort: Pulmonary effort is normal.     Breath sounds: Normal breath sounds.   Musculoskeletal:     Right lower leg: No edema.     Left lower leg: No edema.   Neurological:     Mental Status: She is alert.       EKG:   Recent Labs: 09/19/2023: TSH 1.090 12/19/2023: ALT 26 12/20/2023: BUN 12; Creatinine, Ser 0.74; Hemoglobin 14.1; Platelets 210; Potassium 3.3; Sodium 135    Lipid Panel    Component Value Date/Time   CHOL 110 09/19/2023 1412   TRIG 83 09/19/2023 1412   HDL 54 09/19/2023 1412   CHOLHDL 2.0 09/19/2023 1412   LDLCALC 40  09/19/2023 1412      Other studies Reviewed: Additional studies/ records that were reviewed today include:  Review of the above records demonstrates:       No data to display            ASSESSMENT AND PLAN:    ICD-10-CM   1. Inappropriate sinus tachycardia (HCC)  I47.11    HR 78, on colanor.    2. Primary hypertension  I10    stable    3. Non-rheumatic mitral regurgitation  I34.0     4. S/P AKA (above knee amputation), right (HCC)  Z89.611     5. Mixed hyperlipidemia  E78.2        Problem List Items Addressed This Visit       Cardiovascular and Mediastinum   Non-rheumatic mitral regurgitation   Inappropriate sinus tachycardia (HCC) - Primary     Other   Hyperlipidemia   S/P AKA (above knee amputation), right (HCC)   Other Visit Diagnoses       Primary hypertension       stable          Disposition:   Return in about 5 months (around 07/10/2024).    Total time spent: 30 minutes  Signed,  Debborah Fairly, MD  02/08/2024 10:47 AM    [facility]

## 2024-02-14 ENCOUNTER — Other Ambulatory Visit

## 2024-02-14 ENCOUNTER — Other Ambulatory Visit: Payer: Self-pay | Admitting: Cardiology

## 2024-02-14 DIAGNOSIS — R7303 Prediabetes: Secondary | ICD-10-CM | POA: Diagnosis not present

## 2024-02-14 DIAGNOSIS — E782 Mixed hyperlipidemia: Secondary | ICD-10-CM

## 2024-02-14 DIAGNOSIS — I10 Essential (primary) hypertension: Secondary | ICD-10-CM | POA: Diagnosis not present

## 2024-02-14 DIAGNOSIS — R748 Abnormal levels of other serum enzymes: Secondary | ICD-10-CM

## 2024-02-14 DIAGNOSIS — Z1329 Encounter for screening for other suspected endocrine disorder: Secondary | ICD-10-CM | POA: Diagnosis not present

## 2024-02-14 DIAGNOSIS — R7301 Impaired fasting glucose: Secondary | ICD-10-CM

## 2024-02-15 ENCOUNTER — Ambulatory Visit (INDEPENDENT_AMBULATORY_CARE_PROVIDER_SITE_OTHER): Admitting: Cardiology

## 2024-02-15 ENCOUNTER — Ambulatory Visit: Payer: Self-pay | Admitting: Cardiology

## 2024-02-15 ENCOUNTER — Encounter: Payer: Self-pay | Admitting: Cardiology

## 2024-02-15 VITALS — BP 128/81 | HR 105 | Ht 63.0 in | Wt 138.0 lb

## 2024-02-15 DIAGNOSIS — R748 Abnormal levels of other serum enzymes: Secondary | ICD-10-CM | POA: Diagnosis not present

## 2024-02-15 DIAGNOSIS — R7303 Prediabetes: Secondary | ICD-10-CM | POA: Diagnosis not present

## 2024-02-15 DIAGNOSIS — E782 Mixed hyperlipidemia: Secondary | ICD-10-CM

## 2024-02-15 DIAGNOSIS — Z0130 Encounter for examination of blood pressure without abnormal findings: Secondary | ICD-10-CM

## 2024-02-15 LAB — HEMOGLOBIN A1C
Est. average glucose Bld gHb Est-mCnc: 137 mg/dL
Hgb A1c MFr Bld: 6.4 % — ABNORMAL HIGH (ref 4.8–5.6)

## 2024-02-15 LAB — CMP14+EGFR
ALT: 39 IU/L — ABNORMAL HIGH (ref 0–32)
AST: 57 IU/L — ABNORMAL HIGH (ref 0–40)
Albumin: 4.3 g/dL (ref 3.8–4.8)
Alkaline Phosphatase: 127 IU/L — ABNORMAL HIGH (ref 44–121)
BUN/Creatinine Ratio: 7 — ABNORMAL LOW (ref 12–28)
BUN: 6 mg/dL — ABNORMAL LOW (ref 8–27)
Bilirubin Total: 0.5 mg/dL (ref 0.0–1.2)
CO2: 20 mmol/L (ref 20–29)
Calcium: 9.6 mg/dL (ref 8.7–10.3)
Chloride: 102 mmol/L (ref 96–106)
Creatinine, Ser: 0.81 mg/dL (ref 0.57–1.00)
Globulin, Total: 3.1 g/dL (ref 1.5–4.5)
Glucose: 96 mg/dL (ref 70–99)
Potassium: 4.8 mmol/L (ref 3.5–5.2)
Sodium: 137 mmol/L (ref 134–144)
Total Protein: 7.4 g/dL (ref 6.0–8.5)
eGFR: 78 mL/min/{1.73_m2} (ref >59)

## 2024-02-15 LAB — LIPID PANEL
Chol/HDL Ratio: 2.6 ratio (ref 0.0–4.4)
Cholesterol, Total: 106 mg/dL (ref 100–199)
HDL: 41 mg/dL (ref >39)
LDL Chol Calc (NIH): 44 mg/dL (ref 0–99)
Triglycerides: 113 mg/dL (ref 0–149)
VLDL Cholesterol Cal: 21 mg/dL (ref 5–40)

## 2024-02-15 LAB — TSH: TSH: 1.59 u[IU]/mL (ref 0.450–4.500)

## 2024-02-15 MED ORDER — CARIPRAZINE HCL 1.5 MG PO CAPS: 1.5000 mg | ORAL_CAPSULE | Freq: Every day | ORAL | 1 refills | Status: DC

## 2024-02-15 NOTE — Progress Notes (Signed)
 Established Patient Office Visit  Subjective:  Patient ID: Sara [name], female    DOB: 07/14/1951  Age: 73 y.o. MRN: [id number]  Chief Complaint  Patient presents with   Follow-up    4 month lab results    Patient in office for 4 month follow up, discuss recent lab results. Patient doing well, no complaints today.  Discussed recent lab work.  Hgb A1c increased from previous check, decrease sugar and starch intake.  Liver enzymes elevated. Patient reports known fatty liver, will recheck in 4 months. If still elevated, will order an ultrasound. LDL at goal.  Continue same medications.    No other concerns at this time.   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 Jama   Vertigo    Last episode approx Aug 2015   Wears dentures    full upper    Past Surgical History:  Procedure Laterality Date   ADENOIDECTOMY     AMPUTATION Right 08/11/2020   Procedure: AMPUTATION ABOVE KNEE;  Surgeon: Jama Cordella MATSU, MD;  Location: [facility];  Service: Vascular;  Laterality: Right;   ARTERY BIOPSY Right 07/14/2019   Procedure: BIOPSY TEMPORAL ARTERY;  Surgeon: Jama Cordella MATSU, MD;  Location: [facility];  Service: Vascular;  Laterality: Right;   BREAST CYST ASPIRATION Left    CARDIAC CATHETERIZATION  02/15/2017   [facility]   COLONOSCOPY WITH PROPOFOL  N/A 10/22/2017   Procedure: COLONOSCOPY WITH PROPOFOL ;  Surgeon: Jinny Carmine, MD;  Location: [facility];  Service: Endoscopy;  Laterality: N/A;  specimens not taken--pt on Plavix  will be  brought back in after 7 days off med   COLONOSCOPY WITH PROPOFOL  N/A 11/05/2017   Procedure: COLONOSCOPY WITH PROPOFOL ;  Surgeon: Jinny Carmine, MD;  Location: [facility];  Service: Endoscopy;  Laterality: N/A;   COLONOSCOPY WITH PROPOFOL  N/A 11/13/2022   Procedure: COLONOSCOPY WITH PROPOFOL ;  Surgeon: Jinny Carmine, MD;  Location: [facility];  Service: Endoscopy;  Laterality: N/A;   ESOPHAGOGASTRODUODENOSCOPY N/A 12/21/2014   Procedure: ESOPHAGOGASTRODUODENOSCOPY (EGD);  Surgeon: Carmine Jinny, MD;  Location: [facility];  Service: Gastroenterology;  Laterality: N/A;   ESOPHAGOGASTRODUODENOSCOPY (EGD) WITH PROPOFOL  N/A 02/08/2016   Procedure: ESOPHAGOGASTRODUODENOSCOPY (EGD) WITH PROPOFOL ;  Surgeon: Carmine Jinny, MD;  Location: [facility];  Service: Endoscopy;  Laterality: N/A;   FASCIOTOMY Right 05/30/2019   Procedure: FASCIOTOMY;  Surgeon: Jama Cordella MATSU, MD;  Location: [facility];  Service: Vascular;  Laterality: Right;   FASCIOTOMY CLOSURE Right 06/04/2019   Procedure: FASCIOTOMY CLOSURE;  Surgeon: Jama Cordella MATSU, MD;  Location: [facility];  Service: Vascular;  Laterality: Right;   HEMORROIDECTOMY  2014   LOWER EXTREMITY ANGIOGRAPHY Right 05/30/2019   Procedure: LOWER EXTREMITY ANGIOGRAPHY;  Surgeon: Jama Cordella MATSU, MD;  Location: [facility];  Service: Cardiovascular;  Laterality: Right;   LOWER EXTREMITY ANGIOGRAPHY Right 07/02/2020   Procedure: LOWER EXTREMITY ANGIOGRAPHY;  Surgeon: Jama Cordella MATSU, MD;  Location:  [facility];  Service: Cardiovascular;  Laterality: Right;   PERIPHERAL VASCULAR CATHETERIZATION N/A 02/09/2016   Procedure: Abdominal Aortogram w/Lower Extremity;  Surgeon: Cordella KANDICE Shawl, MD;  Location: [facility];  Service: Cardiovascular;  Laterality: N/A;   PERIPHERAL VASCULAR CATHETERIZATION Right 02/10/2016   Procedure: Lower Extremity Angiography;  Surgeon: Cordella KANDICE Shawl, MD;  Location: [facility];  Service: Cardiovascular;  Laterality: Right;   POLYPECTOMY  11/05/2017   Procedure: POLYPECTOMY;  Surgeon: Jinny Carmine, MD;  Location: [facility];  Service: Endoscopy;;   POLYPECTOMY  11/13/2022   Procedure: POLYPECTOMY;  Surgeon: Jinny Carmine, MD;  Location: [facility];  Service: Endoscopy;;   Sarcoidos     bone   SHOULDER ARTHROSCOPY WITH ROTATOR CUFF REPAIR AND SUBACROMIAL DECOMPRESSION Right 02/27/2020   Procedure: RIGHT SHOULDER ARTHROSCOPY SUBACROMIAL DECOMPRESSION, DISTAL CLAVICLE EXCISION AND MINI-OPEN ROTATOR CUFF REPAIR;  Surgeon: Marchia Drivers, MD;  Location: [facility];  Service: Orthopedics;  Laterality: Right;   THORACIC LAMINECTOMY FOR SPINAL CORD STIMULATOR N/A 08/28/2023   Procedure: THORACIC LAMINECTOMY FOR SPINAL CORD STIMULATOR PLACEMENT (MEDTRONIC);  Surgeon: Claudene Penne ORN, MD;  Location: [facility];  Service: Neurosurgery;  Laterality: N/A;   TONSILLECTOMY     VASCULAR SURGERY  [id number]   Fem-Pop Bypass    Social History   Socioeconomic History   Marital status: Single    Spouse name: Not on file   Number of children: Not on file   Years of education: Not on file   Highest education level: Not on file  Occupational History   Not on file  Tobacco Use   Smoking status: Former    Current packs/day: 0.00    Average packs/day: 2.0 packs/day for 25.0 years (50.0 ttl pk-yrs)    Types: Cigarettes    Start date: 08/21/1966    Quit date: 08/22/1991    Years since quitting: 32.5   Smokeless tobacco: Never  Vaping Use   Vaping status: Never Used  Substance and Sexual Activity   Alcohol use: No    Alcohol/week: 0.0 standard drinks of alcohol   Drug use: Never   Sexual activity: Not Currently  Other Topics Concern   Not on file  Social History Narrative   Not on file   Social Drivers of Health   Financial Resource Strain: Patient Declined (07/16/2023)   Received from [facility]   Overall Financial Resource Strain  (CARDIA)    Difficulty of Paying Living Expenses: Patient declined  Food Insecurity: No Food Insecurity (12/19/2023)   Hunger Vital Sign    Worried About Running Out of Food in the Last Year: Never true    Ran Out of Food in the Last Year: Never true  Transportation Needs: No Transportation Needs (12/19/2023)   PRAPARE - Administrator, Civil Service (Medical): No    Lack of Transportation (Non-Medical): No  Physical Activity: Not on file  Stress: Not on file  Social Connections: Socially Isolated (12/19/2023)   Social Connection and Isolation Panel    Frequency of Communication with Friends and Family: Three times a week    Frequency of Social Gatherings with Friends and Family: Twice a week    Attends Religious Services: Never    Database administrator or Organizations: No    Attends Banker Meetings: Never    Marital Status: Never married  Intimate Partner Violence: Not At Risk (12/19/2023)   Humiliation, Afraid, Rape, and Kick questionnaire  Fear of Current or Ex-Partner: No    Emotionally Abused: No    Physically Abused: No    Sexually Abused: No    Family History  Problem Relation Age of Onset   CVA Mother    Heart attack Mother    Cancer Father        colon cancer   Colon cancer Father    Heart disease Maternal Uncle    Breast cancer Neg Hx     No Active Allergies  Outpatient Medications Prior to Visit  Medication Sig   apixaban  (ELIQUIS ) 2.5 MG TABS tablet Please do not continue until 09/10/22   atorvastatin  (LIPITOR) 40 MG tablet Take 40 mg by mouth at bedtime.    Blood Pressure Monitoring (BLOOD PRESSURE CUFF) MISC 1 Units by Does not apply route daily as needed.   Calcium  Carb-Cholecalciferol  600-10 MG-MCG TABS Take 1 tablet by mouth 2 (two) times daily.   digoxin  (LANOXIN ) 0.125 MG tablet Take 0.125 mg by mouth daily.   DULoxetine  (CYMBALTA ) 60 MG capsule TAKE 1 CAPSULE BY MOUTH TWICE A DAY   gabapentin  (NEURONTIN ) 400 MG tablet Take  400 mg by mouth daily.   ivabradine  (CORLANOR) 7.5 MG TABS tablet Take 1 tablet (7.5 mg total) by mouth 2 (two) times daily with a meal.   losartan  (COZAAR ) 25 MG tablet Take 25 mg by mouth every morning.    triamcinolone  ointment (KENALOG ) 0.5 % Apply 1 Application topically 2 (two) times daily.   [DISCONTINUED] cariprazine  (VRAYLAR ) 1.5 MG capsule Take 1 capsule (1.5 mg total) by mouth daily.   No facility-administered medications prior to visit.    Review of Systems  Constitutional: Negative.   HENT: Negative.    Eyes: Negative.   Respiratory: Negative.  Negative for shortness of breath.   Cardiovascular: Negative.  Negative for chest pain.  Gastrointestinal: Negative.  Negative for abdominal pain, constipation and diarrhea.  Genitourinary: Negative.   Musculoskeletal:  Negative for joint pain and myalgias.  Skin: Negative.   Neurological: Negative.  Negative for dizziness and headaches.  Endo/Heme/Allergies: Negative.   All other systems reviewed and are negative.      Objective:   BP 128/81   Pulse (!) 105   Ht 5' 3 (1.6 m)   Wt 138 lb (62.6 kg)   SpO2 95%   BMI 24.45 kg/m   Vitals:   02/15/24 0932  BP: 128/81  Pulse: (!) 105  Height: 5' 3 (1.6 m)  Weight: 138 lb (62.6 kg)  SpO2: 95%  BMI (Calculated): 24.45    Physical Exam Vitals and nursing note reviewed.  Constitutional:      Appearance: Normal appearance. She is normal weight.  HENT:     Head: Normocephalic and atraumatic.     Nose: Nose normal.     Mouth/Throat:     Mouth: Mucous membranes are moist.   Eyes:     Extraocular Movements: Extraocular movements intact.     Conjunctiva/sclera: Conjunctivae normal.     Pupils: Pupils are equal, round, and reactive to light.    Cardiovascular:     Rate and Rhythm: Normal rate and regular rhythm.     Pulses: Normal pulses.     Heart sounds: Normal heart sounds.  Pulmonary:     Effort: Pulmonary effort is normal.     Breath sounds: Normal breath  sounds.  Abdominal:     General: Abdomen is flat. Bowel sounds are normal.     Palpations: Abdomen is soft.   Musculoskeletal:  General: Normal range of motion.     Cervical back: Normal range of motion.   Skin:    General: Skin is warm and dry.   Neurological:     General: No focal deficit present.     Mental Status: She is alert and oriented to person, place, and time.   Psychiatric:        Mood and Affect: Mood normal.        Behavior: Behavior normal.        Thought Content: Thought content normal.        Judgment: Judgment normal.      No results found for any visits on 02/15/24.  Recent Results (from the past 2160 hours)  Rapid HIV screen     Status: None   Collection Time: 12/19/23 11:29 AM  Result Value Ref Range   HIV-1 P24 Antigen - HIV24 NON REACTIVE NON REACTIVE    Comment: (NOTE) Detection of p24 may be inhibited by biotin in the sample, causing false negative results in acute infection.    HIV 1/2 Antibodies NON REACTIVE NON REACTIVE   Interpretation (HIV Ag Ab)      A non reactive test result means that HIV 1 or HIV 2 antibodies and HIV 1 p24 antigen were not detected in the specimen.    Comment: Performed at [facility], [address] [place], [place] [address]  Comprehensive metabolic panel     Status: Abnormal   Collection Time: 12/19/23 11:29 AM  Result Value Ref Range   Sodium 134 (L) 135 - 145 mmol/L   Potassium 3.6 3.5 - 5.1 mmol/L   Chloride 106 98 - 111 mmol/L   CO2 25 22 - 32 mmol/L   Glucose, Bld 134 (H) 70 - 99 mg/dL    Comment: Glucose reference range applies only to samples taken after fasting for at least 8 hours.   BUN 14 8 - 23 mg/dL   Creatinine, Ser 9.15 0.44 - 1.00 mg/dL   Calcium  8.6 (L) 8.9 - 10.3 mg/dL   Total Protein 7.2 6.5 - 8.1 g/dL   Albumin 3.7 3.5 - 5.0 g/dL   AST 52 (H) 15 - 41 U/L   ALT 26 0 - 44 U/L   Alkaline Phosphatase 56 38 - 126 U/L   Total Bilirubin 1.0 0.0 - 1.2 mg/dL   GFR, Estimated  >39 >39 mL/min    Comment: (NOTE) Calculated using the CKD-EPI Creatinine Equation (2021)    Anion gap 3 (L) 5 - 15    Comment: Performed at [facility], [address] [place], [place] [address]  Hepatitis C antibody     Status: None   Collection Time: 12/19/23 11:29 AM  Result Value Ref Range   HCV Ab NON REACTIVE NON REACTIVE    Comment: (NOTE) Nonreactive HCV antibody screen is consistent with no HCV infections,  unless recent infection is suspected or other evidence exists to indicate HCV infection.  Performed at [facility], [address], [place], [place] [address]   Hepatitis B surface antigen     Status: None   Collection Time: 12/19/23 11:29 AM  Result Value Ref Range   Hepatitis B Surface Ag NON REACTIVE NON REACTIVE    Comment: Performed at [facility], [address], [place], [place] [address]  RPR     Status: None   Collection Time: 12/19/23 11:29 AM  Result Value Ref Range   RPR Ser Ql NON REACTIVE NON  REACTIVE    Comment: Performed at [facility], [address] [place], [place] [address]  CBC with Differential     Status: Abnormal   Collection Time: 12/19/23 11:29 AM  Result Value Ref Range   WBC 10.3 4.0 - 10.5 K/uL   RBC 4.68 3.87 - 5.11 MIL/uL   Hemoglobin 15.0 12.0 - 15.0 g/dL   HCT 55.6 63.9 - 53.9 %   MCV 94.7 80.0 - 100.0 fL   MCH 32.1 26.0 - 34.0 pg   MCHC 33.9 30.0 - 36.0 g/dL   RDW 86.4 88.4 - 84.4 %   Platelets 222 150 - 400 K/uL   nRBC 0.0 0.0 - 0.2 %   Neutrophils Relative % 69 %   Neutro Abs 7.1 1.7 - 7.7 K/uL   Lymphocytes Relative 16 %   Lymphs Abs 1.7 0.7 - 4.0 K/uL   Monocytes Relative 11 %   Monocytes Absolute 1.1 (H) 0.1 - 1.0 K/uL   Eosinophils Relative 3 %   Eosinophils Absolute 0.3 0.0 - 0.5 K/uL   Basophils Relative 1 %   Basophils Absolute 0.1 0.0 - 0.1 K/uL   Immature Granulocytes 0 %   Abs Immature Granulocytes 0.04 0.00 - 0.07 K/uL    Comment: Performed at [facility], [address] [place], [place] [address]  Urinalysis, Complete w Microscopic -Urine, Clean Catch     Status: Abnormal   Collection Time: 12/19/23  3:33 PM  Result Value Ref Range   Color, Urine Man [place] (A) YELLOW    Comment: BIOCHEMICALS MAY BE AFFECTED BY COLOR   APPearance HAZY (A) CLEAR   Specific Gravity, Urine 1.024 1.005 - 1.030   pH 5.0 5.0 - 8.0   Glucose, UA NEGATIVE NEGATIVE mg/dL   Hgb urine dipstick SMALL (A) NEGATIVE   Bilirubin Urine NEGATIVE NEGATIVE   Ketones, ur 5 (A) NEGATIVE mg/dL   Protein, ur 30 (A) NEGATIVE mg/dL   Nitrite NEGATIVE NEGATIVE   Leukocytes,Ua MODERATE (A) NEGATIVE   RBC / HPF 6-10 0 - 5 RBC/hpf   WBC, UA 11-20 0 - 5 WBC/hpf   Bacteria, UA RARE (A) NONE SEEN   Squamous Epithelial / HPF 0 0 - 5 /HPF   Mucus PRESENT     Comment: Performed at [facility], [address] [place], [place] [address]  Urine Drug Screen, Qualitative ([facility] only)     Status: Abnormal   Collection Time: 12/19/23  3:33 PM  Result Value Ref Range   Tricyclic, Ur Screen NONE DETECTED NONE DETECTED   Amphetamines, Ur Screen NONE DETECTED NONE DETECTED   MDMA (Ecstasy)Ur Screen NONE DETECTED NONE DETECTED   Cocaine Metabolite,Ur [place] NONE DETECTED NONE DETECTED   Opiate, Ur Screen POSITIVE (A) NONE DETECTED   Phencyclidine (PCP) Ur S NONE DETECTED NONE DETECTED   Cannabinoid 50 Ng, Ur [place] NONE DETECTED NONE DETECTED   Barbiturates, Ur Screen NONE DETECTED NONE DETECTED   Benzodiazepine, Ur Scrn NONE DETECTED NONE DETECTED   Methadone Scn, Ur NONE DETECTED NONE DETECTED    Comment: (NOTE) Tricyclics + metabolites, urine    Cutoff 1000 ng/mL Amphetamines + metabolites, urine  Cutoff 1000 ng/mL MDMA (Ecstasy), urine              Cutoff 500 ng/mL Cocaine Metabolite, urine          Cutoff 300 ng/mL Opiate + metabolites, urine        Cutoff 300 ng/mL Phencyclidine (PCP), urine  Cutoff 25 ng/mL Cannabinoid, urine                 Cutoff 50 ng/mL Barbiturates +  metabolites, urine  Cutoff 200 ng/mL Benzodiazepine, urine              Cutoff 200 ng/mL Methadone, urine                   Cutoff 300 ng/mL  The urine drug screen provides only a preliminary, unconfirmed analytical test result and should not be used for non-medical purposes. Clinical consideration and professional judgment should be applied to any positive drug screen result due to possible interfering substances. A more specific alternate chemical method must be used in order to obtain a confirmed analytical result. Gas chromatography / mass spectrometry (GC/MS) is the preferred confirm atory method. Performed at [facility], [address] [place], [place] [address]   Basic metabolic panel     Status: Abnormal   Collection Time: 12/20/23  4:28 AM  Result Value Ref Range   Sodium 135 135 - 145 mmol/L   Potassium 3.3 (L) 3.5 - 5.1 mmol/L   Chloride 102 98 - 111 mmol/L   CO2 23 22 - 32 mmol/L   Glucose, Bld 108 (H) 70 - 99 mg/dL    Comment: Glucose reference range applies only to samples taken after fasting for at least 8 hours.   BUN 12 8 - 23 mg/dL   Creatinine, Ser 9.25 0.44 - 1.00 mg/dL   Calcium  8.4 (L) 8.9 - 10.3 mg/dL   GFR, Estimated >39 >39 mL/min    Comment: (NOTE) Calculated using the CKD-EPI Creatinine Equation (2021)    Anion gap 10 5 - 15    Comment: Performed at [facility], [address] [place], [place] [address]  CBC     Status: None   Collection Time: 12/20/23  4:28 AM  Result Value Ref Range   WBC 10.0 4.0 - 10.5 K/uL   RBC 4.42 3.87 - 5.11 MIL/uL   Hemoglobin 14.1 12.0 - 15.0 g/dL   HCT 59.0 63.9 - 53.9 %   MCV 92.5 80.0 - 100.0 fL   MCH 31.9 26.0 - 34.0 pg   MCHC 34.5 30.0 - 36.0 g/dL   RDW 86.6 88.4 - 84.4 %   Platelets 210 150 - 400 K/uL   nRBC 0.0 0.0 - 0.2 %    Comment: Performed at [facility], [address] [place], [place] [address]  CMP14+EGFR     Status: Abnormal   Collection Time: 02/14/24 11:15 AM   Result Value Ref Range   Glucose 96 70 - 99 mg/dL   BUN 6 (L) 8 - 27 mg/dL   Creatinine, Ser 9.18 0.57 - 1.00 mg/dL   eGFR 78 >40 fO/fpw/8.26   BUN/Creatinine Ratio 7 (L) 12 - 28   Sodium 137 134 - 144 mmol/L   Potassium 4.8 3.5 - 5.2 mmol/L   Chloride 102 96 - 106 mmol/L   CO2 20 20 - 29 mmol/L   Calcium  9.6 8.7 - 10.3 mg/dL   Total Protein 7.4 6.0 - 8.5 g/dL   Albumin 4.3 3.8 - 4.8 g/dL   Globulin, Total 3.1 1.5 - 4.5 g/dL   Bilirubin Total 0.5 0.0 - 1.2 mg/dL   Alkaline Phosphatase 127 (H) 44 - 121 IU/L   AST 57 (H) 0 - 40 IU/L   ALT 39 (H) 0 - 32 IU/L  Lipid Profile  Status: None   Collection Time: 02/14/24 11:15 AM  Result Value Ref Range   Cholesterol, Total 106 100 - 199 mg/dL   Triglycerides 886 0 - 149 mg/dL   HDL 41 >60 mg/dL   VLDL Cholesterol Cal 21 5 - 40 mg/dL   LDL Chol Calc (NIH) 44 0 - 99 mg/dL   Chol/HDL Ratio 2.6 0.0 - 4.4 ratio    Comment:                                   T. Chol/HDL Ratio                                             Men  Women                               1/2 Avg.Risk  3.4    3.3                                   Avg.Risk  5.0    4.4                                2X Avg.Risk  9.6    7.1                                3X Avg.Risk 23.4   11.0   TSH     Status: None   Collection Time: 02/14/24 11:15 AM  Result Value Ref Range   TSH 1.590 0.450 - 4.500 uIU/mL  Hemoglobin A1c     Status: Abnormal   Collection Time: 02/14/24 11:15 AM  Result Value Ref Range   Hgb A1c MFr Bld 6.4 (H) 4.8 - 5.6 %    Comment:          Prediabetes: 5.7 - 6.4          Diabetes: >6.4          Glycemic control for adults with diabetes: <7.0    Est. average glucose Bld gHb Est-mCnc 137 mg/dL      Assessment & Plan:  Continue same medications.  Problem List Items Addressed This Visit       Other   Hyperlipidemia - Primary   Elevated liver enzymes   Prediabetes    Return in about 4 months (around 06/16/2024) for fasting labs prior.   Total  time spent: 25 minutes  [employer], NP  02/15/2024   This document may have been prepared by Dragon Voice Recognition software and as such may include unintentional dictation errors.

## 2024-03-31 DIAGNOSIS — D869 Sarcoidosis, unspecified: Secondary | ICD-10-CM | POA: Diagnosis not present

## 2024-03-31 DIAGNOSIS — Z79899 Other long term (current) drug therapy: Secondary | ICD-10-CM | POA: Diagnosis not present

## 2024-03-31 DIAGNOSIS — M316 Other giant cell arteritis: Secondary | ICD-10-CM | POA: Diagnosis not present

## 2024-04-22 ENCOUNTER — Ambulatory Visit: Payer: Medicare PPO | Admitting: Dermatology

## 2024-04-24 ENCOUNTER — Other Ambulatory Visit (INDEPENDENT_AMBULATORY_CARE_PROVIDER_SITE_OTHER): Payer: Self-pay | Admitting: Vascular Surgery

## 2024-04-24 DIAGNOSIS — I70212 Atherosclerosis of native arteries of extremities with intermittent claudication, left leg: Secondary | ICD-10-CM

## 2024-04-28 ENCOUNTER — Ambulatory Visit (INDEPENDENT_AMBULATORY_CARE_PROVIDER_SITE_OTHER)

## 2024-04-28 ENCOUNTER — Ambulatory Visit (INDEPENDENT_AMBULATORY_CARE_PROVIDER_SITE_OTHER): Admitting: Vascular Surgery

## 2024-04-28 ENCOUNTER — Encounter (INDEPENDENT_AMBULATORY_CARE_PROVIDER_SITE_OTHER): Payer: Self-pay | Admitting: Vascular Surgery

## 2024-04-28 VITALS — BP 112/70 | HR 69

## 2024-04-28 DIAGNOSIS — I70212 Atherosclerosis of native arteries of extremities with intermittent claudication, left leg: Secondary | ICD-10-CM

## 2024-04-28 DIAGNOSIS — Z89611 Acquired absence of right leg above knee: Secondary | ICD-10-CM

## 2024-04-28 DIAGNOSIS — E0849 Diabetes mellitus due to underlying condition with other diabetic neurological complication: Secondary | ICD-10-CM

## 2024-04-28 DIAGNOSIS — I872 Venous insufficiency (chronic) (peripheral): Secondary | ICD-10-CM | POA: Diagnosis not present

## 2024-04-28 DIAGNOSIS — I6523 Occlusion and stenosis of bilateral carotid arteries: Secondary | ICD-10-CM | POA: Diagnosis not present

## 2024-04-28 DIAGNOSIS — M316 Other giant cell arteritis: Secondary | ICD-10-CM | POA: Diagnosis not present

## 2024-04-30 LAB — VAS US ABI WITH/WO TBI: Left ABI: 1.56

## 2024-05-03 ENCOUNTER — Encounter (INDEPENDENT_AMBULATORY_CARE_PROVIDER_SITE_OTHER): Payer: Self-pay | Admitting: Vascular Surgery

## 2024-05-03 NOTE — Progress Notes (Signed)
 MRN : [id number]  Sara [name] is a 73 y.o. (1950/12/07) female who presents with chief complaint of check circulation.  History of Present Illness:   The patient returns to the office for followup and review of the noninvasive studies regarding lower extremity occlusive disease.  She is also followed for carotid stenosis.   There have been no interval changes in her left lower extremity symptoms. No interval shortening of the patient's claudication distance or development of rest pain symptoms. No new ulcers or wounds have occurred since the last visit.  She has been following with Dr. Marcelino and has found huge relief from her phantom pains with his treatment.   The patient has complaints of phantom pains involving the right lower extremity and the sensation that he she is still having excruciating pain in her right foot.  This continues to be her primary complaint.   The carotid stenosis followed by ultrasound.    The patient denies interval amaurosis fugax. There is no recent history of TIA symptoms or focal motor deficits. There is no prior documented CVA.   The patient is taking enteric-coated aspirin  81 mg daily.   There have been no significant changes to the patient's overall health care.   There is no history of DVT, PE or superficial thrombophlebitis. The patient denies recent episodes of angina or shortness of breath.    Previous ABI Rt=AKA and Lt=1.56 (TBI=1.03)  (previous ABI's Rt=AKA and Lt=1.10 )   Previous carotid Duplex shows 1-39% bilateral ICA stenosis.  No change compared to last study.  Current Meds  Medication Sig   apixaban  (ELIQUIS ) 2.5 MG TABS tablet Please do not continue until 09/10/22   atorvastatin  (LIPITOR) 40 MG tablet Take 40 mg by mouth at bedtime.    Blood Pressure Monitoring (BLOOD PRESSURE CUFF) MISC 1 Units by Does not apply route daily as needed.   Calcium   Carb-Cholecalciferol  600-10 MG-MCG TABS Take 1 tablet by mouth 2 (two) times daily.   cariprazine  (VRAYLAR ) 1.5 MG capsule Take 1 capsule (1.5 mg total) by mouth daily.   digoxin  (LANOXIN ) 0.125 MG tablet Take 0.125 mg by mouth daily.   DULoxetine  (CYMBALTA ) 60 MG capsule TAKE 1 CAPSULE BY MOUTH TWICE A DAY   gabapentin  (NEURONTIN ) 400 MG tablet Take 400 mg by mouth daily.   ivabradine  (CORLANOR) 7.5 MG TABS tablet Take 1 tablet (7.5 mg total) by mouth 2 (two) times daily with a meal.   losartan  (COZAAR ) 25 MG tablet Take 25 mg by mouth every morning.    triamcinolone  ointment (KENALOG ) 0.5 % Apply 1 Application topically 2 (two) times daily.    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 Jama   Vertigo    Last episode approx Aug 2015   Wears dentures    full upper    Past Surgical History:  Procedure Laterality Date   ADENOIDECTOMY     AMPUTATION Right 08/11/2020   Procedure: AMPUTATION ABOVE KNEE;  Surgeon: Jama Cordella MATSU, MD;  Location: [facility];  Service: Vascular;  Laterality: Right;   ARTERY BIOPSY Right 07/14/2019   Procedure: BIOPSY TEMPORAL ARTERY;  Surgeon: Jama Cordella MATSU, MD;  Location: [facility];  Service: Vascular;  Laterality: Right;   BREAST CYST ASPIRATION Left    CARDIAC CATHETERIZATION  02/15/2017   [facility]   COLONOSCOPY WITH PROPOFOL  N/A 10/22/2017   Procedure: COLONOSCOPY WITH PROPOFOL ;  Surgeon: Jinny Carmine, MD;  Location: [facility];  Service: Endoscopy;  Laterality: N/A;  specimens not taken--pt on Plavix  will  be brought back in after 7 days off med   COLONOSCOPY WITH PROPOFOL  N/A 11/05/2017   Procedure: COLONOSCOPY WITH PROPOFOL ;  Surgeon: Jinny Carmine, MD;  Location: [facility];  Service: Endoscopy;  Laterality: N/A;   COLONOSCOPY WITH PROPOFOL  N/A 11/13/2022   Procedure: COLONOSCOPY WITH PROPOFOL ;  Surgeon: Jinny Carmine, MD;  Location: [facility];  Service: Endoscopy;  Laterality: N/A;   ESOPHAGOGASTRODUODENOSCOPY N/A 12/21/2014   Procedure: ESOPHAGOGASTRODUODENOSCOPY (EGD);  Surgeon: Carmine Jinny, MD;  Location: [facility];  Service: Gastroenterology;  Laterality: N/A;   ESOPHAGOGASTRODUODENOSCOPY (EGD) WITH PROPOFOL  N/A 02/08/2016   Procedure: ESOPHAGOGASTRODUODENOSCOPY (EGD) WITH PROPOFOL ;  Surgeon: Carmine Jinny, MD;  Location: [facility];  Service: Endoscopy;  Laterality: N/A;   FASCIOTOMY Right 05/30/2019   Procedure: FASCIOTOMY;  Surgeon: Jama Cordella MATSU, MD;  Location: [facility];  Service: Vascular;  Laterality: Right;   FASCIOTOMY CLOSURE Right 06/04/2019   Procedure: FASCIOTOMY CLOSURE;  Surgeon: Jama Cordella MATSU, MD;  Location: [facility];  Service: Vascular;  Laterality: Right;   HEMORROIDECTOMY  2014   LOWER EXTREMITY ANGIOGRAPHY Right 05/30/2019   Procedure: LOWER EXTREMITY ANGIOGRAPHY;  Surgeon: Jama Cordella MATSU, MD;  Location: [facility];  Service: Cardiovascular;  Laterality: Right;   LOWER EXTREMITY ANGIOGRAPHY Right 07/02/2020   Procedure: LOWER EXTREMITY ANGIOGRAPHY;  Surgeon: Jama Cordella MATSU, MD;  Location: [facility];  Service: Cardiovascular;  Laterality: Right;   PERIPHERAL VASCULAR CATHETERIZATION N/A 02/09/2016   Procedure: Abdominal Aortogram w/Lower Extremity;  Surgeon: Cordella MATSU Jama, MD;  Location: [facility];  Service: Cardiovascular;  Laterality: N/A;   PERIPHERAL VASCULAR CATHETERIZATION Right 02/10/2016   Procedure: Lower Extremity Angiography;  Surgeon: Cordella MATSU Jama, MD;  Location: [facility];  Service: Cardiovascular;  Laterality: Right;   POLYPECTOMY  11/05/2017   Procedure: POLYPECTOMY;  Surgeon: Jinny Carmine, MD;  Location: [facility];  Service: Endoscopy;;   POLYPECTOMY  11/13/2022   Procedure: POLYPECTOMY;  Surgeon: Jinny Carmine, MD;  Location: [facility];  Service: Endoscopy;;   Sarcoidos     bone   SHOULDER ARTHROSCOPY WITH ROTATOR CUFF REPAIR AND SUBACROMIAL DECOMPRESSION Right 02/27/2020   Procedure: RIGHT SHOULDER ARTHROSCOPY SUBACROMIAL DECOMPRESSION, DISTAL CLAVICLE EXCISION AND MINI-OPEN ROTATOR CUFF REPAIR;  Surgeon: Marchia Drivers, MD;  Location: [facility];  Service: Orthopedics;  Laterality: Right;   THORACIC LAMINECTOMY FOR SPINAL CORD STIMULATOR N/A 08/28/2023   Procedure: THORACIC LAMINECTOMY FOR SPINAL CORD STIMULATOR PLACEMENT (MEDTRONIC);  Surgeon: Claudene Penne ORN, MD;  Location: [facility];  Service: Neurosurgery;  Laterality: N/A;   TONSILLECTOMY     VASCULAR SURGERY  [id number]   Fem-Pop Bypass    Social History Social History   Tobacco Use   Smoking status: Former    Current packs/day: 0.00    Average packs/day: 2.0 packs/day for 25.0 years (50.0 ttl pk-yrs)    Types: Cigarettes    Start date: 08/21/1966    Quit date: 08/22/1991    Years since quitting: 32.7   Smokeless tobacco: Never  Vaping Use   Vaping status: Never Used  Substance Use Topics   Alcohol use: No    Alcohol/week: 0.0 standard drinks of alcohol   Drug use: Never    Family History Family History  Problem Relation Age of Onset   CVA Mother    Heart attack Mother    Cancer Father        colon cancer   Colon cancer Father    Heart disease Maternal Uncle    Breast cancer Neg Hx     No Active Allergies   REVIEW OF SYSTEMS (Negative unless checked)  Constitutional: [] Weight loss  [] Fever  [] Chills Cardiac: [] Chest pain   [] Chest pressure   [] Palpitations   [] Shortness of breath when laying flat   [] Shortness of breath with  exertion. Vascular:  [x] Pain in legs with walking   [] Pain in legs at rest  [] History of DVT   [] Phlebitis   [] Swelling in legs   [] Varicose veins   [] Non-healing ulcers Pulmonary:   [] Uses home oxygen   [] Productive cough   [] Hemoptysis   [] Wheeze  [] COPD   [] Asthma Neurologic:  [] Dizziness   [] Seizures   [] History of stroke   [] History of TIA  [] Aphasia   [] Vissual changes   [] Weakness or numbness in arm   [] Weakness or numbness in leg Musculoskeletal:   [] Joint swelling   [] Joint pain   [] Low back pain Hematologic:  [] Easy bruising  [] Easy bleeding   [] Hypercoagulable state   [] Anemic Gastrointestinal:  [] Diarrhea   [] Vomiting  [] Gastroesophageal reflux/heartburn   [] Difficulty swallowing. Genitourinary:  [] Chronic kidney disease   [] Difficult urination  [] Frequent urination   [] Blood in urine Skin:  [] Rashes   [] Ulcers  Psychological:  [] History of anxiety   []  History of major depression.  Physical Examination  Vitals:   04/28/24 1058  BP: 112/70  Pulse: 69   There is no height or weight on file to calculate BMI. Gen: WD/WN, NAD Head: [place]/AT, No temporalis wasting.  Ear/Nose/Throat: Hearing grossly intact, nares w/o erythema or drainage Eyes: PER, EOMI, sclera nonicteric.  Neck: Supple, no masses.  No bruit or JVD.  Pulmonary:  Good air movement, no audible wheezing, no use of accessory muscles.  Cardiac: RRR, normal S1, S2, no Murmurs. Vascular:  mild trophic changes, no open wounds Vessel Right Left  Radial Palpable Palpable  PT AKA Not Palpable  DP AKA Not Palpable  Gastrointestinal: soft, non-distended. No guarding/no peritoneal signs.  Musculoskeletal: M/S 5/5 throughout.  No visible deformity.  Neurologic: CN 2-12 intact. Pain and light touch intact in extremities.  Symmetrical.  Speech is fluent. Motor exam as listed above. Psychiatric: Judgment intact, Mood & affect appropriate for pt's clinical situation. Dermatologic: No rashes or ulcers noted.  No changes consistent  with cellulitis.   CBC Lab Results  Component Value Date   WBC 10.0 12/20/2023   HGB 14.1 12/20/2023   HCT 40.9 12/20/2023   MCV 92.5 12/20/2023   PLT 210 12/20/2023    BMET  Component Value Date/Time   NA 137 02/14/2024 1115   K 4.8 02/14/2024 1115   CL 102 02/14/2024 1115   CO2 20 02/14/2024 1115   GLUCOSE 96 02/14/2024 1115   GLUCOSE 108 (H) 12/20/2023 0428   BUN 6 (L) 02/14/2024 1115   CREATININE 0.81 02/14/2024 1115   CREATININE 0.88 05/16/2016 1034   CALCIUM  9.6 02/14/2024 1115   GFRNONAA >60 12/20/2023 0428   GFRNONAA 66 05/01/2016 0846   GFRAA >60 02/18/2020 0949   GFRAA 76 05/01/2016 0846   CrCl cannot be calculated (Patient's most recent lab result is older than the maximum 21 days allowed.).  COAG Lab Results  Component Value Date   INR 1.0 08/09/2020   INR 1.3 (H) 02/18/2020   INR 1.0 07/11/2019    Radiology VAS US  ABI WITH/WO TBI Result Date: 04/30/2024  LOWER EXTREMITY DOPPLER STUDY Patient Name:  Makinna Andy  Date of Exam:   04/28/2024 Medical Rec #: [id number]             Accession #:    [id number] Date of Birth: 09-Aug-1951              Patient Gender: F Patient Age:   68 years Exam Location:  [facility] Procedure:      VAS US  ABI WITH/WO TBI Referring Phys: Cordella Shawl --------------------------------------------------------------------------------  Indications: Rest pain, and peripheral artery disease. High Risk Factors: Hypertension, coronary artery disease.  Vascular Interventions: 02/10/2016 PTA of right femoral to popliteal autogenous                         bypass graft. PTA right distal posterior tibial artery.                         12/2008 and 02/1990 Right femoral to popliteal autogenous                         bypass graft.                          05/30/2019: PTA and Stent placement at 2 locations in                         the Femoral below knee poplteal bypass. Mechanical                         Thrombectomy Right  Popliteal bypass and TibioPeroneal                         trunk and Proximal Peroneal Artery. Infusion of TPA to                         the Right Femoral-Popliteal Bypass.                          05/30/2019: Compartment Syndrome Right Lower Extremity                         Status Post Revascularization.                          06/04/2019: Compartment Syndrome Status Post  Fasciotomies. Comparison Study: 04/26/2023 Performing Technologist: Leafy Gibes RVS  Examination Guidelines: A complete evaluation includes at minimum, Doppler waveform signals and systolic blood pressure reading at the level of bilateral brachial, anterior tibial, and posterior tibial arteries, when vessel segments are accessible. Bilateral testing is considered an integral part of a complete examination. Photoelectric Plethysmograph (PPG) waveforms and toe systolic pressure readings are included as required and additional duplex testing as needed. Limited examinations for reoccurring indications may be performed as noted.  ABI Findings: +--------+------------------+-----+--------+--------+ Right   Rt Pressure (mmHg)IndexWaveformComment  +--------+------------------+-----+--------+--------+ [id number]                                     +--------+------------------+-----+--------+--------+ PTA                                    AKA      +--------+------------------+-----+--------+--------+ +---------+------------------+-----+---------+-------+ Left     Lt Pressure (mmHg)IndexWaveform Comment +---------+------------------+-----+---------+-------+ Brachial 107                                     +---------+------------------+-----+---------+-------+ ATA      167               1.56 biphasic         +---------+------------------+-----+---------+-------+ PTA      164               1.53 triphasic        +---------+------------------+-----+---------+-------+ PERO     160                1.54 biphasic         +---------+------------------+-----+---------+-------+ Great Toe110               1.03 Normal           +---------+------------------+-----+---------+-------+ +-------+-----------+-----------+------------+------------+ ABI/TBIToday's ABIToday's TBIPrevious ABIPrevious TBI +-------+-----------+-----------+------------+------------+ Right  Rt AKA                Rt AKA                   +-------+-----------+-----------+------------+------------+ Left   1.56       1.03       1.10        .78          +-------+-----------+-----------+------------+------------+ Left ABIs appear essentially unchanged compared to prior study on 04/26/2023. Right TBIs appear increased compared to prior study on 04/26/2023.  Summary: Right: Rt AKA. Left: Resting left ankle-brachial index is within normal range. The left toe-brachial index is normal.  *See table(s) above for measurements and observations.  Electronically signed by Cordella Shawl MD on 04/30/2024 at 7:29:14 AM.    Final      Assessment/Plan 1. 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  The patient does not voice lifestyle limiting changes at this point in time.   Noninvasive studies do not suggest clinically significant change.   No invasive studies, angiography or surgery at this time The patient should continue walking and begin a more formal exercise program.  The patient should continue antiplatelet therapy and aggressive treatment of the lipid abnormalities   No changes in the  patient's medications at this time   Continued surveillance is indicated as atherosclerosis is likely to progress with time.     The patient will continue follow up with noninvasive studies as ordered.   2. Bilateral carotid artery stenosis Recommend:   Given  the patient's asymptomatic subcritical stenosis no further invasive testing or surgery at this time.   Duplex ultrasound shows 1-39% stenosis bilaterally.   Continue antiplatelet therapy as prescribed Continue management of CAD, HTN and Hyperlipidemia Healthy heart diet,  encouraged exercise at least 4 times per week Follow up in 18 months with duplex ultrasound and physical exam   3. Chronic venous insufficiency No surgery or intervention at this point in time.   The patient is CEAP [id number]   I have discussed with the patient venous insufficiency and why it  causes symptoms. I have discussed with the patient the chronic skin changes that accompany venous insufficiency and the long term sequela such as infection and ulceration.  Patient will begin wearing graduated compression stockings or compression wraps on a daily basis.  The patient will put the compression on first thing in the morning and removing them in the evening. The patient is instructed specifically not to sleep in the compression.    In addition, behavioral modification including several periods of elevation of the lower extremities during the day will be continued. I have demonstrated that proper elevation is a position with the ankles at heart level.  The patient is instructed to begin routine exercise, especially walking on a daily basis  The patient will be assessed for a Lymph Pump depending on the effectiveness of conservative therapy and the control of the associated lymphedema.  4. Temporal arteritis (HCC) Patient will continue therapy per rheumatology no changes at this time.   5. 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  Hgb A1C to be monitored as already arranged by primary service  6. S/P AKA (above knee amputation), right [facility]) She notes that she is  currently having a refit of her prosthetic and will continue to use her above-knee prosthetic once she has it back.     Cordella Shawl, MD  05/03/2024 12:17 PM

## 2024-05-16 ENCOUNTER — Encounter: Payer: Self-pay | Admitting: Cardiology

## 2024-05-16 ENCOUNTER — Ambulatory Visit: Admitting: Cardiology

## 2024-05-16 VITALS — BP 120/60 | HR 71 | Ht 63.0 in | Wt 135.0 lb

## 2024-05-16 DIAGNOSIS — Z0130 Encounter for examination of blood pressure without abnormal findings: Secondary | ICD-10-CM

## 2024-05-16 DIAGNOSIS — M79642 Pain in left hand: Secondary | ICD-10-CM

## 2024-05-16 DIAGNOSIS — M79641 Pain in right hand: Secondary | ICD-10-CM

## 2024-05-16 MED ORDER — PREDNISONE 20 MG PO TABS: 40.0000 mg | ORAL_TABLET | Freq: Every day | ORAL | 0 refills | Status: AC

## 2024-05-16 NOTE — Progress Notes (Signed)
 Established Patient Office Visit  Subjective:  Patient ID: Sara [name], female    DOB: Sep 17, 1950  Age: 73 y.o. MRN: [id number]  Chief Complaint  Patient presents with   Acute Visit    Bilateral Hand Swelling/ Pain    Patient in office for an acute visit, complaining of bilateral hand pain and edema. Patient reports symptoms started yesterday. Left hand and right wrist worse. Patient denies injury to hands. Will send in prednisone . Patient to notify office if edema worsens.   Hand Pain  The incident occurred 12 to 24 hours ago. There was no injury mechanism. The pain is present in the right hand, left hand, left wrist and right wrist. The quality of the pain is described as aching. The pain does not radiate. The pain is mild. The pain has been Constant since the incident. Pertinent negatives include no chest pain, numbness or tingling. Nothing aggravates the symptoms. She has tried acetaminophen  for the symptoms. The treatment provided no relief.    No other concerns at this time.   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 Jama   Vertigo    Last episode approx Aug 2015   Wears dentures    full upper    Past Surgical History:  Procedure Laterality Date   ADENOIDECTOMY     AMPUTATION Right 08/11/2020   Procedure: AMPUTATION ABOVE KNEE;  Surgeon: Jama Cordella MATSU, MD;  Location: [facility];  Service: Vascular;  Laterality: Right;   ARTERY BIOPSY Right 07/14/2019   Procedure: BIOPSY TEMPORAL ARTERY;  Surgeon: Jama Cordella MATSU, MD;   Location: [facility];  Service: Vascular;  Laterality: Right;   BREAST CYST ASPIRATION Left    CARDIAC CATHETERIZATION  02/15/2017   [facility]   COLONOSCOPY WITH PROPOFOL  N/A 10/22/2017   Procedure: COLONOSCOPY WITH PROPOFOL ;  Surgeon: Jinny Carmine, MD;  Location: [facility];  Service: Endoscopy;  Laterality: N/A;  specimens not taken--pt on Plavix  will be brought back in after 7 days off med   COLONOSCOPY WITH PROPOFOL  N/A 11/05/2017   Procedure: COLONOSCOPY WITH PROPOFOL ;  Surgeon: Jinny Carmine, MD;  Location: [facility];  Service: Endoscopy;  Laterality: N/A;   COLONOSCOPY WITH PROPOFOL  N/A 11/13/2022   Procedure: COLONOSCOPY WITH PROPOFOL ;  Surgeon: Jinny Carmine, MD;  Location: [facility];  Service: Endoscopy;  Laterality: N/A;   ESOPHAGOGASTRODUODENOSCOPY N/A 12/21/2014   Procedure: ESOPHAGOGASTRODUODENOSCOPY (EGD);  Surgeon: Carmine Jinny, MD;  Location: [facility];  Service: Gastroenterology;  Laterality: N/A;   ESOPHAGOGASTRODUODENOSCOPY (EGD) WITH PROPOFOL  N/A 02/08/2016   Procedure: ESOPHAGOGASTRODUODENOSCOPY (EGD) WITH PROPOFOL ;  Surgeon: Carmine Jinny, MD;  Location: [facility];  Service: Endoscopy;  Laterality: N/A;   FASCIOTOMY Right 05/30/2019   Procedure: FASCIOTOMY;  Surgeon: Jama Cordella MATSU, MD;  Location: [facility];  Service: Vascular;  Laterality: Right;   FASCIOTOMY CLOSURE Right 06/04/2019   Procedure: FASCIOTOMY CLOSURE;  Surgeon: Jama Cordella MATSU, MD;  Location: [facility];  Service: Vascular;  Laterality: Right;   HEMORROIDECTOMY  2014   LOWER EXTREMITY ANGIOGRAPHY Right 05/30/2019   Procedure: LOWER EXTREMITY ANGIOGRAPHY;  Surgeon: Jama Cordella MATSU, MD;  Location: [facility];  Service: Cardiovascular;  Laterality: Right;   LOWER EXTREMITY ANGIOGRAPHY Right 07/02/2020   Procedure: LOWER EXTREMITY ANGIOGRAPHY;  Surgeon: Jama Cordella MATSU, MD;  Location: [facility];  Service: Cardiovascular;  Laterality: Right;    PERIPHERAL VASCULAR CATHETERIZATION N/A 02/09/2016   Procedure: Abdominal Aortogram w/Lower Extremity;  Surgeon: Cordella MATSU Jama, MD;  Location: [facility];  Service: Cardiovascular;  Laterality: N/A;   PERIPHERAL VASCULAR CATHETERIZATION Right 02/10/2016   Procedure: Lower Extremity Angiography;  Surgeon: Cordella MATSU Jama, MD;  Location: [facility];  Service: Cardiovascular;  Laterality: Right;   POLYPECTOMY  11/05/2017   Procedure: POLYPECTOMY;  Surgeon: Jinny Carmine, MD;  Location: [facility];  Service: Endoscopy;;   POLYPECTOMY  11/13/2022   Procedure: POLYPECTOMY;  Surgeon: Jinny Carmine, MD;  Location: [facility];  Service: Endoscopy;;   Sarcoidos     bone   SHOULDER ARTHROSCOPY WITH ROTATOR CUFF REPAIR AND SUBACROMIAL DECOMPRESSION Right 02/27/2020   Procedure: RIGHT SHOULDER ARTHROSCOPY SUBACROMIAL DECOMPRESSION, DISTAL CLAVICLE EXCISION AND MINI-OPEN ROTATOR CUFF REPAIR;  Surgeon: Marchia Drivers, MD;  Location: [facility];  Service: Orthopedics;  Laterality: Right;   THORACIC LAMINECTOMY FOR SPINAL CORD STIMULATOR N/A 08/28/2023   Procedure: THORACIC LAMINECTOMY FOR SPINAL CORD STIMULATOR PLACEMENT (MEDTRONIC);  Surgeon: Claudene Penne ORN, MD;  Location: [facility];  Service: Neurosurgery;  Laterality: N/A;   TONSILLECTOMY     VASCULAR SURGERY  8008,7987   Fem-Pop Bypass    Social History   Socioeconomic History   Marital status: Single    Spouse name: Not on file   Number of children: Not on file   Years of education: Not on file   Highest education level: Not on file  Occupational History   Not on file  Tobacco Use   Smoking status: Former    Current packs/day: 0.00    Average packs/day: 2.0 packs/day for 25.0 years (50.0 ttl pk-yrs)    Types: Cigarettes    Start date: 08/21/1966    Quit date: 08/22/1991    Years since quitting: 32.7   Smokeless tobacco: Never  Vaping Use   Vaping status: Never Used  Substance and Sexual Activity    Alcohol use: No    Alcohol/week: 0.0 standard drinks of alcohol   Drug use: Never   Sexual activity: Not Currently  Other Topics Concern   Not on file  Social History Narrative   Not on file   Social Drivers of Health   Financial Resource Strain: Patient Declined (07/16/2023)   Received from [facility]   Overall Financial Resource Strain (CARDIA)    Difficulty of Paying Living Expenses: Patient declined  Food Insecurity: No Food Insecurity (12/19/2023)   Hunger Vital Sign    Worried About Running Out of Food in the Last Year: Never true    Ran Out of Food in the Last Year: Never true  Transportation Needs: No Transportation Needs (12/19/2023)   PRAPARE - Administrator, Civil Service (Medical): No    Lack of Transportation (Non-Medical): No  Physical Activity: Not on file  Stress: Not on file  Social Connections: Socially Isolated (12/19/2023)   Social Connection and Isolation Panel    Frequency of Communication with Friends and Family: Three times a week  Frequency of Social Gatherings with Friends and Family: Twice a week    Attends Religious Services: Never    Database administrator or Organizations: No    Attends Banker Meetings: Never    Marital Status: Never married  Intimate Partner Violence: Not At Risk (12/19/2023)   Humiliation, Afraid, Rape, and Kick questionnaire    Fear of Current or Ex-Partner: No    Emotionally Abused: No    Physically Abused: No    Sexually Abused: No    Family History  Problem Relation Age of Onset   CVA Mother    Heart attack Mother    Cancer Father        colon cancer   Colon cancer Father    Heart disease Maternal Uncle    Breast cancer Neg Hx     Allergies  Allergen Reactions   Zolpidem  Anaphylaxis and Other (See Comments)    Outpatient Medications Prior to Visit  Medication Sig   apixaban  (ELIQUIS ) 2.5 MG TABS tablet Please do not continue until 09/10/22   atorvastatin  (LIPITOR)  40 MG tablet Take 40 mg by mouth at bedtime.    Blood Pressure Monitoring (BLOOD PRESSURE CUFF) MISC 1 Units by Does not apply route daily as needed.   Calcium  Carb-Cholecalciferol  600-10 MG-MCG TABS Take 1 tablet by mouth 2 (two) times daily.   cariprazine  (VRAYLAR ) 1.5 MG capsule Take 1 capsule (1.5 mg total) by mouth daily.   digoxin  (LANOXIN ) 0.125 MG tablet Take 0.125 mg by mouth daily.   DULoxetine  (CYMBALTA ) 60 MG capsule TAKE 1 CAPSULE BY MOUTH TWICE A DAY   gabapentin  (NEURONTIN ) 400 MG tablet Take 400 mg by mouth daily.   ivabradine  (CORLANOR) 7.5 MG TABS tablet Take 1 tablet (7.5 mg total) by mouth 2 (two) times daily with a meal.   losartan  (COZAAR ) 25 MG tablet Take 25 mg by mouth every morning.    triamcinolone  ointment (KENALOG ) 0.5 % Apply 1 Application topically 2 (two) times daily.   No facility-administered medications prior to visit.    Review of Systems  Constitutional: Negative.   HENT: Negative.    Eyes: Negative.   Respiratory: Negative.  Negative for shortness of breath.   Cardiovascular: Negative.  Negative for chest pain.  Gastrointestinal: Negative.  Negative for abdominal pain, constipation and diarrhea.  Genitourinary: Negative.   Musculoskeletal:  Positive for joint pain. Negative for myalgias.  Skin: Negative.   Neurological: Negative.  Negative for dizziness, tingling, numbness and headaches.  Endo/Heme/Allergies: Negative.   All other systems reviewed and are negative.      Objective:   BP 120/60   Pulse 71   Ht 5' 3 (1.6 m)   Wt 135 lb (61.2 kg)   SpO2 94%   BMI 23.91 kg/m   Vitals:   05/16/24 0907  BP: 120/60  Pulse: 71  Height: 5' 3 (1.6 m)  Weight: 135 lb (61.2 kg)  SpO2: 94%  BMI (Calculated): 23.92    Physical Exam Vitals and nursing note reviewed.  Constitutional:      Appearance: Normal appearance. She is normal weight.  HENT:     Head: Normocephalic and atraumatic.     Nose: Nose normal.     Mouth/Throat:      Mouth: Mucous membranes are moist.  Eyes:     Extraocular Movements: Extraocular movements intact.     Conjunctiva/sclera: Conjunctivae normal.     Pupils: Pupils are equal, round, and reactive to light.  Cardiovascular:  Rate and Rhythm: Normal rate and regular rhythm.     Pulses: Normal pulses.     Heart sounds: Normal heart sounds.  Pulmonary:     Effort: Pulmonary effort is normal.     Breath sounds: Normal breath sounds.  Abdominal:     General: Abdomen is flat. Bowel sounds are normal.     Palpations: Abdomen is soft.  Musculoskeletal:        General: Normal range of motion.     Right forearm: Swelling present.     Left forearm: Swelling present.     Right wrist: Swelling present.     Left wrist: Swelling present.     Right hand: Swelling present.     Left hand: Swelling present.     Cervical back: Normal range of motion.  Skin:    General: Skin is warm and dry.  Neurological:     General: No focal deficit present.     Mental Status: She is alert and oriented to person, place, and time.  Psychiatric:        Mood and Affect: Mood normal.        Behavior: Behavior normal.        Thought Content: Thought content normal.        Judgment: Judgment normal.      No results found for any visits on 05/16/24.  Recent Results (from the past 2160 hours)  VAS US  ABI WITH/WO TBI     Status: None   Collection Time: 04/28/24 10:40 AM  Result Value Ref Range   Right ABI Rt AKA    Left ABI 1.56       Assessment & Plan:  Prednisone   Problem List Items Addressed This Visit       Other   Hand pain, not arthralgia, right - Primary    Return if symptoms worsen or fail to improve, for as scheduled.   Total time spent: 25 minutes  Google, NP  05/16/2024   This document may have been prepared by Dragon Voice Recognition software and as such may include unintentional dictation errors.

## 2024-05-20 ENCOUNTER — Telehealth: Payer: Self-pay

## 2024-05-20 NOTE — Telephone Encounter (Signed)
 Patient  LM stating that her hands are better but they are still swollen please advise on what patient should do

## 2024-06-03 ENCOUNTER — Encounter: Payer: Self-pay | Admitting: Cardiology

## 2024-06-03 ENCOUNTER — Ambulatory Visit (INDEPENDENT_AMBULATORY_CARE_PROVIDER_SITE_OTHER): Admitting: Cardiology

## 2024-06-03 VITALS — BP 125/86 | HR 109 | Ht 63.0 in | Wt 138.2 lb

## 2024-06-03 DIAGNOSIS — E782 Mixed hyperlipidemia: Secondary | ICD-10-CM | POA: Diagnosis not present

## 2024-06-03 DIAGNOSIS — Z1329 Encounter for screening for other suspected endocrine disorder: Secondary | ICD-10-CM

## 2024-06-03 DIAGNOSIS — R7303 Prediabetes: Secondary | ICD-10-CM | POA: Diagnosis not present

## 2024-06-03 DIAGNOSIS — M79641 Pain in right hand: Secondary | ICD-10-CM

## 2024-06-03 DIAGNOSIS — M79642 Pain in left hand: Secondary | ICD-10-CM

## 2024-06-03 DIAGNOSIS — I10 Essential (primary) hypertension: Secondary | ICD-10-CM

## 2024-06-03 DIAGNOSIS — R748 Abnormal levels of other serum enzymes: Secondary | ICD-10-CM | POA: Diagnosis not present

## 2024-06-03 MED ORDER — MELOXICAM 15 MG PO TABS: 15.0000 mg | ORAL_TABLET | Freq: Every day | ORAL | 1 refills | Status: AC

## 2024-06-03 NOTE — Progress Notes (Signed)
 Established Patient Office Visit  Subjective:  Patient ID: Sara [name], female    DOB: Mar 19, 1951  Age: 73 y.o. MRN: [id number]  Chief Complaint  Patient presents with   Acute Visit    Left hand swollen.     Patient in office for an acute visit, complaining of left hand edema. Patient seen on 05/16/2024 with similar complaints. Was started on prednisone . Patient reports edema resolved while on prednisone . After finishing prednisone , edema returned in left hand and fingers. Patient reports fingers are painful when bending.  Patient due for fasting lab work. Will order fasting lab work along with CRP, ANA and a rheumatoid arthritis panel.  Will send in meloxicam for pain.  Return in 2 weeks as previously scheduled.     No other concerns at this time.   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 Jama   Vertigo    Last episode approx Aug 2015   Wears dentures    full upper    Past Surgical History:  Procedure Laterality Date   ADENOIDECTOMY     AMPUTATION Right 08/11/2020   Procedure: AMPUTATION ABOVE KNEE;  Surgeon: Jama Cordella MATSU, MD;  Location: [facility];  Service: Vascular;  Laterality: Right;   ARTERY BIOPSY Right 07/14/2019   Procedure: BIOPSY TEMPORAL ARTERY;  Surgeon: Jama Cordella MATSU, MD;  Location: [facility];  Service: Vascular;  Laterality: Right;   BREAST CYST ASPIRATION Left    CARDIAC CATHETERIZATION  02/15/2017   [facility]   COLONOSCOPY WITH PROPOFOL  N/A 10/22/2017   Procedure: COLONOSCOPY WITH PROPOFOL ;  Surgeon: Jinny Carmine, MD;  Location: [facility];  Service: Endoscopy;  Laterality: N/A;  specimens not taken--pt on Plavix  will be brought back in after 7 days off med   COLONOSCOPY WITH PROPOFOL  N/A 11/05/2017   Procedure: COLONOSCOPY WITH PROPOFOL ;  Surgeon: Jinny Carmine, MD;  Location: [facility];  Service: Endoscopy;  Laterality: N/A;   COLONOSCOPY WITH PROPOFOL  N/A 11/13/2022   Procedure: COLONOSCOPY WITH PROPOFOL ;  Surgeon: Jinny Carmine, MD;  Location: [facility];  Service: Endoscopy;  Laterality: N/A;   ESOPHAGOGASTRODUODENOSCOPY N/A 12/21/2014   Procedure: ESOPHAGOGASTRODUODENOSCOPY (EGD);  Surgeon: Carmine Jinny, MD;  Location: [facility];  Service: Gastroenterology;  Laterality: N/A;   ESOPHAGOGASTRODUODENOSCOPY (EGD) WITH PROPOFOL  N/A 02/08/2016   Procedure: ESOPHAGOGASTRODUODENOSCOPY (EGD) WITH PROPOFOL ;  Surgeon: Carmine Jinny, MD;  Location: [facility];  Service: Endoscopy;  Laterality: N/A;   FASCIOTOMY Right 05/30/2019   Procedure: FASCIOTOMY;  Surgeon: Jama Cordella MATSU, MD;  Location: [facility];  Service: Vascular;  Laterality: Right;   FASCIOTOMY CLOSURE Right 06/04/2019   Procedure: FASCIOTOMY CLOSURE;  Surgeon: Jama Cordella MATSU, MD;  Location: [facility];  Service: Vascular;  Laterality: Right;   HEMORROIDECTOMY  2014   LOWER EXTREMITY ANGIOGRAPHY Right 05/30/2019   Procedure: LOWER EXTREMITY ANGIOGRAPHY;  Surgeon: Jama Cordella MATSU, MD;  Location: [facility];  Service: Cardiovascular;  Laterality: Right;  LOWER EXTREMITY ANGIOGRAPHY Right 07/02/2020   Procedure: LOWER EXTREMITY ANGIOGRAPHY;  Surgeon: Jama Cordella MATSU, MD;  Location: [facility];  Service: Cardiovascular;  Laterality: Right;   PERIPHERAL VASCULAR CATHETERIZATION N/A 02/09/2016   Procedure: Abdominal Aortogram w/Lower Extremity;  Surgeon: Cordella MATSU Jama, MD;  Location: [facility];  Service: Cardiovascular;  Laterality: N/A;   PERIPHERAL VASCULAR  CATHETERIZATION Right 02/10/2016   Procedure: Lower Extremity Angiography;  Surgeon: Cordella MATSU Jama, MD;  Location: [facility];  Service: Cardiovascular;  Laterality: Right;   POLYPECTOMY  11/05/2017   Procedure: POLYPECTOMY;  Surgeon: Jinny Carmine, MD;  Location: [facility];  Service: Endoscopy;;   POLYPECTOMY  11/13/2022   Procedure: POLYPECTOMY;  Surgeon: Jinny Carmine, MD;  Location: [facility];  Service: Endoscopy;;   Sarcoidos     bone   SHOULDER ARTHROSCOPY WITH ROTATOR CUFF REPAIR AND SUBACROMIAL DECOMPRESSION Right 02/27/2020   Procedure: RIGHT SHOULDER ARTHROSCOPY SUBACROMIAL DECOMPRESSION, DISTAL CLAVICLE EXCISION AND MINI-OPEN ROTATOR CUFF REPAIR;  Surgeon: Marchia Drivers, MD;  Location: [facility];  Service: Orthopedics;  Laterality: Right;   THORACIC LAMINECTOMY FOR SPINAL CORD STIMULATOR N/A 08/28/2023   Procedure: THORACIC LAMINECTOMY FOR SPINAL CORD STIMULATOR PLACEMENT (MEDTRONIC);  Surgeon: Claudene Penne ORN, MD;  Location: [facility];  Service: Neurosurgery;  Laterality: N/A;   TONSILLECTOMY     VASCULAR SURGERY  [id number]   Fem-Pop Bypass    Social History   Socioeconomic History   Marital status: Single    Spouse name: Not on file   Number of children: Not on file   Years of education: Not on file   Highest education level: Not on file  Occupational History   Not on file  Tobacco Use   Smoking status: Former    Current packs/day: 0.00    Average packs/day: 2.0 packs/day for 25.0 years (50.0 ttl pk-yrs)    Types: Cigarettes    Start date: 08/21/1966    Quit date: 08/22/1991    Years since quitting: 32.8   Smokeless tobacco: Never  Vaping Use   Vaping status: Never Used  Substance and Sexual Activity   Alcohol use: No    Alcohol/week: 0.0 standard drinks of alcohol   Drug use: Never   Sexual activity: Not Currently  Other Topics Concern   Not on file  Social History Narrative   Not on file   Social Drivers of Health    Financial Resource Strain: Patient Declined (07/16/2023)   Received from [facility]   Overall Financial Resource Strain (CARDIA)    Difficulty of Paying Living Expenses: Patient declined  Food Insecurity: No Food Insecurity (12/19/2023)   Hunger Vital Sign    Worried About Running Out of Food in the Last Year: Never true    Ran Out of Food in the Last Year: Never true  Transportation Needs: No Transportation Needs (12/19/2023)   PRAPARE - Administrator, Civil Service (Medical): No    Lack of Transportation (Non-Medical): No  Physical Activity: Not on file  Stress: Not on file  Social Connections: Socially Isolated (12/19/2023)   Social Connection and Isolation Panel    Frequency of Communication with Friends and Family: Three times a week    Frequency of Social Gatherings with Friends and Family: Twice a week    Attends Religious Services: Never    Database administrator or Organizations: No    Attends Banker Meetings: Never    Marital Status: Never  married  Intimate Partner Violence: Not At Risk (12/19/2023)   Humiliation, Afraid, Rape, and Kick questionnaire    Fear of Current or Ex-Partner: No    Emotionally Abused: No    Physically Abused: No    Sexually Abused: No    Family History  Problem Relation Age of Onset   CVA Mother    Heart attack Mother    Cancer Father        colon cancer   Colon cancer Father    Heart disease Maternal Uncle    Breast cancer Neg Hx     Allergies  Allergen Reactions   Zolpidem  Anaphylaxis and Other (See Comments)    Outpatient Medications Prior to Visit  Medication Sig   apixaban  (ELIQUIS ) 2.5 MG TABS tablet Please do not continue until 09/10/22   atorvastatin  (LIPITOR) 40 MG tablet Take 40 mg by mouth at bedtime.    Blood Pressure Monitoring (BLOOD PRESSURE CUFF) MISC 1 Units by Does not apply route daily as needed.   Calcium  Carb-Cholecalciferol  600-10 MG-MCG TABS Take 1 tablet by mouth 2  (two) times daily.   cariprazine  (VRAYLAR ) 1.5 MG capsule Take 1 capsule (1.5 mg total) by mouth daily.   digoxin  (LANOXIN ) 0.125 MG tablet Take 0.125 mg by mouth daily.   DULoxetine  (CYMBALTA ) 60 MG capsule TAKE 1 CAPSULE BY MOUTH TWICE A DAY   gabapentin  (NEURONTIN ) 400 MG tablet Take 400 mg by mouth daily.   ivabradine  (CORLANOR) 7.5 MG TABS tablet Take 1 tablet (7.5 mg total) by mouth 2 (two) times daily with a meal.   losartan  (COZAAR ) 25 MG tablet Take 25 mg by mouth every morning.    triamcinolone  ointment (KENALOG ) 0.5 % Apply 1 Application topically 2 (two) times daily.   No facility-administered medications prior to visit.    Review of Systems  Constitutional: Negative.   HENT: Negative.    Eyes: Negative.   Respiratory: Negative.  Negative for shortness of breath.   Cardiovascular: Negative.  Negative for chest pain.  Gastrointestinal: Negative.  Negative for abdominal pain, constipation and diarrhea.  Genitourinary: Negative.   Musculoskeletal:  Negative for joint pain and myalgias.  Skin: Negative.   Neurological: Negative.  Negative for dizziness and headaches.  Endo/Heme/Allergies: Negative.   All other systems reviewed and are negative.      Objective:   BP 125/86   Pulse (!) 109   Ht 5' 3 (1.6 m)   Wt 138 lb 3.2 oz (62.7 kg)   SpO2 93%   BMI 24.48 kg/m   Vitals:   06/03/24 1009  BP: 125/86  Pulse: (!) 109  Height: 5' 3 (1.6 m)  Weight: 138 lb 3.2 oz (62.7 kg)  SpO2: 93%  BMI (Calculated): 24.49    Physical Exam Vitals and nursing note reviewed.  Constitutional:      Appearance: Normal appearance. She is normal weight.  HENT:     Head: Normocephalic and atraumatic.     Nose: Nose normal.     Mouth/Throat:     Mouth: Mucous membranes are moist.  Eyes:     Extraocular Movements: Extraocular movements intact.     Conjunctiva/sclera: Conjunctivae normal.     Pupils: Pupils are equal, round, and reactive to light.  Cardiovascular:     Rate  and Rhythm: Normal rate and regular rhythm.     Pulses: Normal pulses.     Heart sounds: Normal heart sounds.  Pulmonary:     Effort: Pulmonary effort is normal.  Breath sounds: Normal breath sounds.  Abdominal:     General: Abdomen is flat. Bowel sounds are normal.     Palpations: Abdomen is soft.  Musculoskeletal:        General: Normal range of motion.     Cervical back: Normal range of motion.  Skin:    General: Skin is warm and dry.  Neurological:     General: No focal deficit present.     Mental Status: She is alert and oriented to person, place, and time.  Psychiatric:        Mood and Affect: Mood normal.        Behavior: Behavior normal.        Thought Content: Thought content normal.        Judgment: Judgment normal.      No results found for any visits on 06/03/24.  Recent Results (from the past 2160 hours)  VAS US  ABI WITH/WO TBI     Status: None   Collection Time: 04/28/24 10:40 AM  Result Value Ref Range   Right ABI Rt AKA    Left ABI 1.56       Assessment & Plan:  Return for fasting lab work Meloxicam  Problem List Items Addressed This Visit       Other   Hyperlipidemia   Relevant Orders   Lipid Profile   Elevated liver enzymes   Prediabetes - Primary   Relevant Orders   Hemoglobin A1c   CMP14+EGFR   Hand pain, not arthralgia, right   Relevant Orders   ANA, IFA (with reflex)   Rheumatoid Arthritis Profile   CRP (C-Reactive Protein)   Other Visit Diagnoses       Hand pain, not arthralgia, left       Relevant Orders   ANA, IFA (with reflex)   Rheumatoid Arthritis Profile   CRP (C-Reactive Protein)     Primary hypertension       Relevant Orders   CMP14+EGFR     Thyroid  disorder screening       Relevant Orders   TSH       Return if symptoms worsen or fail to improve, for as scheduled.   Total time spent: 25 minutes  Google, NP  06/03/2024   This document may have been prepared by Dragon Voice Recognition  software and as such may include unintentional dictation errors.

## 2024-06-16 ENCOUNTER — Encounter: Payer: Self-pay | Admitting: Cardiology

## 2024-06-16 ENCOUNTER — Ambulatory Visit (INDEPENDENT_AMBULATORY_CARE_PROVIDER_SITE_OTHER): Admitting: Cardiology

## 2024-06-16 VITALS — BP 114/82 | HR 96 | Ht 63.0 in | Wt 137.2 lb

## 2024-06-16 DIAGNOSIS — R000 Tachycardia, unspecified: Secondary | ICD-10-CM

## 2024-06-16 DIAGNOSIS — F331 Major depressive disorder, recurrent, moderate: Secondary | ICD-10-CM | POA: Insufficient documentation

## 2024-06-16 DIAGNOSIS — E782 Mixed hyperlipidemia: Secondary | ICD-10-CM | POA: Diagnosis not present

## 2024-06-16 DIAGNOSIS — R7303 Prediabetes: Secondary | ICD-10-CM

## 2024-06-16 DIAGNOSIS — I10 Essential (primary) hypertension: Secondary | ICD-10-CM

## 2024-06-16 DIAGNOSIS — R748 Abnormal levels of other serum enzymes: Secondary | ICD-10-CM

## 2024-06-16 DIAGNOSIS — M79642 Pain in left hand: Secondary | ICD-10-CM | POA: Diagnosis not present

## 2024-06-16 DIAGNOSIS — M79641 Pain in right hand: Secondary | ICD-10-CM | POA: Diagnosis not present

## 2024-06-16 DIAGNOSIS — Z1329 Encounter for screening for other suspected endocrine disorder: Secondary | ICD-10-CM | POA: Diagnosis not present

## 2024-06-16 NOTE — Progress Notes (Signed)
 Established Patient Office Visit  Subjective:  Patient ID: Sara [name], female    DOB: 10-16-1950  Age: 73 y.o. MRN: [id number]  Chief Complaint  Patient presents with   Results    4 month lab results    Patient in office for 4 month follow up, did not have fasting lab work done. Will do labs today and call with results.  Patient doing well, no new complaints today. Continues to have bilateral hand pain, left worse than right. ANA, CRP and rheumatoid arthritis panel previously ordered, will check today.  Elevated liver enzymes on previous blood work, will recheck today. Blood pressure controlled on recheck. Patient reports not taking her medications this morning.  Heart rate controlled on digoxin  and Corlanor. Follows with cardiology. Continue same doses.  Patient pre diabetic, continue strict diet control. Patient depression controlled on Cymbalta  and Vraylar , continue current dose.     No other concerns at this time.   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 Jama   Vertigo    Last episode approx Aug 2015   Vertigo, benign paroxysmal 12/28/2015   Wears dentures    full upper    Past Surgical History:  Procedure Laterality Date   ADENOIDECTOMY     AMPUTATION Right 08/11/2020   Procedure: AMPUTATION ABOVE KNEE;  Surgeon: Jama Cordella MATSU, MD;   Location: [facility];  Service: Vascular;  Laterality: Right;   ARTERY BIOPSY Right 07/14/2019   Procedure: BIOPSY TEMPORAL ARTERY;  Surgeon: Jama Cordella MATSU, MD;  Location: [facility];  Service: Vascular;  Laterality: Right;   BREAST CYST ASPIRATION Left    CARDIAC CATHETERIZATION  02/15/2017   [facility]   COLONOSCOPY WITH PROPOFOL  N/A 10/22/2017   Procedure: COLONOSCOPY WITH PROPOFOL ;  Surgeon: Jinny Carmine, MD;  Location: [facility];  Service: Endoscopy;  Laterality: N/A;  specimens not taken--pt on Plavix  will be brought back in after 7 days off med   COLONOSCOPY WITH PROPOFOL  N/A 11/05/2017   Procedure: COLONOSCOPY WITH PROPOFOL ;  Surgeon: Jinny Carmine, MD;  Location: [facility];  Service: Endoscopy;  Laterality: N/A;   COLONOSCOPY WITH PROPOFOL  N/A 11/13/2022   Procedure: COLONOSCOPY WITH PROPOFOL ;  Surgeon: Jinny Carmine, MD;  Location: [facility];  Service: Endoscopy;  Laterality: N/A;   ESOPHAGOGASTRODUODENOSCOPY N/A 12/21/2014   Procedure: ESOPHAGOGASTRODUODENOSCOPY (EGD);  Surgeon: Carmine Jinny, MD;  Location: [facility];  Service: Gastroenterology;  Laterality: N/A;   ESOPHAGOGASTRODUODENOSCOPY (EGD) WITH PROPOFOL  N/A 02/08/2016   Procedure: ESOPHAGOGASTRODUODENOSCOPY (EGD) WITH PROPOFOL ;  Surgeon: Carmine Jinny, MD;  Location: [facility];  Service: Endoscopy;  Laterality: N/A;   FASCIOTOMY Right 05/30/2019   Procedure: FASCIOTOMY;  Surgeon: Jama Cordella MATSU, MD;  Location: [facility];  Service: Vascular;  Laterality: Right;  FASCIOTOMY CLOSURE Right 06/04/2019   Procedure: FASCIOTOMY CLOSURE;  Surgeon: Jama Cordella MATSU, MD;  Location: [facility];  Service: Vascular;  Laterality: Right;   HEMORROIDECTOMY  2014   LOWER EXTREMITY ANGIOGRAPHY Right 05/30/2019   Procedure: LOWER EXTREMITY ANGIOGRAPHY;  Surgeon: Jama Cordella MATSU, MD;  Location: [facility];  Service: Cardiovascular;  Laterality: Right;   LOWER EXTREMITY ANGIOGRAPHY Right  07/02/2020   Procedure: LOWER EXTREMITY ANGIOGRAPHY;  Surgeon: Jama Cordella MATSU, MD;  Location: [facility];  Service: Cardiovascular;  Laterality: Right;   PERIPHERAL VASCULAR CATHETERIZATION N/A 02/09/2016   Procedure: Abdominal Aortogram w/Lower Extremity;  Surgeon: Cordella MATSU Jama, MD;  Location: [facility];  Service: Cardiovascular;  Laterality: N/A;   PERIPHERAL VASCULAR CATHETERIZATION Right 02/10/2016   Procedure: Lower Extremity Angiography;  Surgeon: Cordella MATSU Jama, MD;  Location: [facility];  Service: Cardiovascular;  Laterality: Right;   POLYPECTOMY  11/05/2017   Procedure: POLYPECTOMY;  Surgeon: Jinny Carmine, MD;  Location: [facility];  Service: Endoscopy;;   POLYPECTOMY  11/13/2022   Procedure: POLYPECTOMY;  Surgeon: Jinny Carmine, MD;  Location: [facility];  Service: Endoscopy;;   Sarcoidos     bone   SHOULDER ARTHROSCOPY WITH ROTATOR CUFF REPAIR AND SUBACROMIAL DECOMPRESSION Right 02/27/2020   Procedure: RIGHT SHOULDER ARTHROSCOPY SUBACROMIAL DECOMPRESSION, DISTAL CLAVICLE EXCISION AND MINI-OPEN ROTATOR CUFF REPAIR;  Surgeon: Marchia Drivers, MD;  Location: [facility];  Service: Orthopedics;  Laterality: Right;   THORACIC LAMINECTOMY FOR SPINAL CORD STIMULATOR N/A 08/28/2023   Procedure: THORACIC LAMINECTOMY FOR SPINAL CORD STIMULATOR PLACEMENT (MEDTRONIC);  Surgeon: Claudene Penne ORN, MD;  Location: [facility];  Service: Neurosurgery;  Laterality: N/A;   TONSILLECTOMY     VASCULAR SURGERY  [id number]   Fem-Pop Bypass    Social History   Socioeconomic History   Marital status: Single    Spouse name: Not on file   Number of children: Not on file   Years of education: Not on file   Highest education level: Not on file  Occupational History   Not on file  Tobacco Use   Smoking status: Former    Current packs/day: 0.00    Average packs/day: 2.0 packs/day for 25.0 years (50.0 ttl pk-yrs)    Types: Cigarettes    Start date:  08/21/1966    Quit date: 08/22/1991    Years since quitting: 32.8   Smokeless tobacco: Never  Vaping Use   Vaping status: Never Used  Substance and Sexual Activity   Alcohol use: No    Alcohol/week: 0.0 standard drinks of alcohol   Drug use: Never   Sexual activity: Not Currently  Other Topics Concern   Not on file  Social History Narrative   Not on file   Social Drivers of Health   Financial Resource Strain: Patient Declined (07/16/2023)   Received from [facility]   Overall Financial Resource Strain (CARDIA)    Difficulty of Paying Living Expenses: Patient declined  Food Insecurity: No Food Insecurity (12/19/2023)   Hunger Vital Sign    Worried About Running Out of Food in the Last Year: Never true    Ran Out of Food in the Last Year: Never true  Transportation Needs: No Transportation Needs (12/19/2023)   PRAPARE - Administrator, Civil Service (Medical): No    Lack of Transportation (Non-Medical): No  Physical Activity: Not on file  Stress: Not on file  Social Connections: Socially Isolated (12/19/2023)   Social Connection  and Isolation Panel    Frequency of Communication with Friends and Family: Three times a week    Frequency of Social Gatherings with Friends and Family: Twice a week    Attends Religious Services: Never    Database Administrator or Organizations: No    Attends Banker Meetings: Never    Marital Status: Never married  Intimate Partner Violence: Not At Risk (12/19/2023)   Humiliation, Afraid, Rape, and Kick questionnaire    Fear of Current or Ex-Partner: No    Emotionally Abused: No    Physically Abused: No    Sexually Abused: No    Family History  Problem Relation Age of Onset   CVA Mother    Heart attack Mother    Cancer Father        colon cancer   Colon cancer Father    Heart disease Maternal Uncle    Breast cancer Neg Hx     Allergies  Allergen Reactions   Zolpidem  Anaphylaxis and Other (See  Comments)    Outpatient Medications Prior to Visit  Medication Sig   apixaban  (ELIQUIS ) 2.5 MG TABS tablet Please do not continue until 09/10/22   atorvastatin  (LIPITOR) 40 MG tablet Take 40 mg by mouth at bedtime.    Blood Pressure Monitoring (BLOOD PRESSURE CUFF) MISC 1 Units by Does not apply route daily as needed.   Calcium  Carb-Cholecalciferol  600-10 MG-MCG TABS Take 1 tablet by mouth 2 (two) times daily.   cariprazine  (VRAYLAR ) 1.5 MG capsule Take 1 capsule (1.5 mg total) by mouth daily.   digoxin  (LANOXIN ) 0.125 MG tablet Take 0.125 mg by mouth daily.   DULoxetine  (CYMBALTA ) 60 MG capsule TAKE 1 CAPSULE BY MOUTH TWICE A DAY   gabapentin  (NEURONTIN ) 400 MG tablet Take 400 mg by mouth daily.   ivabradine  (CORLANOR) 7.5 MG TABS tablet Take 1 tablet (7.5 mg total) by mouth 2 (two) times daily with a meal.   losartan  (COZAAR ) 25 MG tablet Take 25 mg by mouth every morning.    meloxicam (MOBIC) 15 MG tablet Take 1 tablet (15 mg total) by mouth daily.   triamcinolone  ointment (KENALOG ) 0.5 % Apply 1 Application topically 2 (two) times daily.   No facility-administered medications prior to visit.    Review of Systems  Constitutional: Negative.   HENT: Negative.    Eyes: Negative.   Respiratory: Negative.  Negative for shortness of breath.   Cardiovascular: Negative.  Negative for chest pain.  Gastrointestinal: Negative.  Negative for abdominal pain, constipation and diarrhea.  Genitourinary: Negative.   Musculoskeletal:  Negative for joint pain and myalgias.  Skin: Negative.   Neurological: Negative.  Negative for dizziness and headaches.  Endo/Heme/Allergies: Negative.   All other systems reviewed and are negative.      Objective:   BP 114/82 (BP Location: Right Arm, Patient Position: Sitting, Cuff Size: Small)   Pulse 96   Ht 5' 3 (1.6 m)   Wt 137 lb 3.2 oz (62.2 kg)   SpO2 96%   BMI 24.30 kg/m   Vitals:   06/16/24 1019 06/16/24 1035  BP: (!) 150/80 114/82  Pulse:  96   Height: 5' 3 (1.6 m)   Weight: 137 lb 3.2 oz (62.2 kg)   SpO2: 96%   BMI (Calculated): 24.31     Physical Exam Vitals and nursing note reviewed.  Constitutional:      Appearance: Normal appearance. She is normal weight.  HENT:     Head: Normocephalic and atraumatic.  Nose: Nose normal.     Mouth/Throat:     Mouth: Mucous membranes are moist.  Eyes:     Extraocular Movements: Extraocular movements intact.     Conjunctiva/sclera: Conjunctivae normal.     Pupils: Pupils are equal, round, and reactive to light.  Cardiovascular:     Rate and Rhythm: Normal rate and regular rhythm.     Pulses: Normal pulses.     Heart sounds: Normal heart sounds.  Pulmonary:     Effort: Pulmonary effort is normal.     Breath sounds: Normal breath sounds.  Abdominal:     General: Abdomen is flat. Bowel sounds are normal.     Palpations: Abdomen is soft.  Musculoskeletal:        General: Normal range of motion.     Cervical back: Normal range of motion.  Skin:    General: Skin is warm and dry.  Neurological:     General: No focal deficit present.     Mental Status: She is alert and oriented to person, place, and time.  Psychiatric:        Mood and Affect: Mood normal.        Behavior: Behavior normal.        Thought Content: Thought content normal.        Judgment: Judgment normal.      No results found for any visits on 06/16/24.  Recent Results (from the past 2160 hours)  VAS US  ABI WITH/WO TBI     Status: None   Collection Time: 04/28/24 10:40 AM  Result Value Ref Range   Right ABI Rt AKA    Left ABI 1.56       Assessment & Plan:  Fasting lab work today Continue appointments with specialists Continue same medications Strict diabetic diet  Problem List Items Addressed This Visit       Cardiovascular and Mediastinum   Primary hypertension     Other   Hyperlipidemia - Primary   Sinus tachycardia   Elevated liver enzymes   Prediabetes   Moderate episode of  recurrent major depressive disorder (HCC)    Return in about 4 months (around 10/17/2024) for fasitng lab work prior.   Total time spent: 25 minutes. This time includes review of previous notes and results and patient face to face interaction during today's visit.    Jeoffrey Pollen, NP  06/16/2024   This document may have been prepared by Dragon Voice Recognition software and as such may include unintentional dictation errors.

## 2024-06-19 LAB — CMP14+EGFR
ALT: 19 IU/L (ref 0–32)
AST: 34 IU/L (ref 0–40)
Albumin: 4 g/dL (ref 3.8–4.8)
Alkaline Phosphatase: 89 IU/L (ref 49–135)
BUN/Creatinine Ratio: 9 — ABNORMAL LOW (ref 12–28)
BUN: 8 mg/dL (ref 8–27)
Bilirubin Total: 0.7 mg/dL (ref 0.0–1.2)
CO2: 22 mmol/L (ref 20–29)
Calcium: 10.1 mg/dL (ref 8.7–10.3)
Chloride: 101 mmol/L (ref 96–106)
Creatinine, Ser: 0.94 mg/dL (ref 0.57–1.00)
Globulin, Total: 3.4 g/dL (ref 1.5–4.5)
Glucose: 112 mg/dL — ABNORMAL HIGH (ref 70–99)
Potassium: 4.8 mmol/L (ref 3.5–5.2)
Sodium: 140 mmol/L (ref 134–144)
Total Protein: 7.4 g/dL (ref 6.0–8.5)
eGFR: 65 mL/min/1.73 (ref >59)

## 2024-06-19 LAB — FANA STAINING PATTERNS: Homogeneous Pattern: 1:640 {titer} — ABNORMAL HIGH

## 2024-06-19 LAB — HEMOGLOBIN A1C
Est. average glucose Bld gHb Est-mCnc: 137 mg/dL
Hgb A1c MFr Bld: 6.4 % — ABNORMAL HIGH (ref 4.8–5.6)

## 2024-06-19 LAB — RHEUMATOID ARTHRITIS PROFILE
Cyclic Citrullin Peptide Ab: 9 U (ref 0–19)
Rheumatoid fact SerPl-aCnc: <10 [IU]/mL (ref <14.0)

## 2024-06-19 LAB — LIPID PANEL
Chol/HDL Ratio: 2.4 ratio (ref 0.0–4.4)
Cholesterol, Total: 96 mg/dL — ABNORMAL LOW (ref 100–199)
HDL: 40 mg/dL (ref >39)
LDL Chol Calc (NIH): 39 mg/dL (ref 0–99)
Triglycerides: 81 mg/dL (ref 0–149)
VLDL Cholesterol Cal: 17 mg/dL (ref 5–40)

## 2024-06-19 LAB — C-REACTIVE PROTEIN: CRP: 7 mg/L (ref 0–10)

## 2024-06-19 LAB — ANTINUCLEAR ANTIBODIES, IFA: ANA Titer 1: POSITIVE — AB

## 2024-06-19 LAB — TSH: TSH: 2.09 u[IU]/mL (ref 0.450–4.500)

## 2024-06-20 ENCOUNTER — Ambulatory Visit: Payer: Self-pay | Admitting: Cardiology

## 2024-07-10 ENCOUNTER — Ambulatory Visit: Admitting: Cardiovascular Disease

## 2024-07-14 ENCOUNTER — Inpatient Hospital Stay
Admission: EM | Admit: 2024-07-14 | Discharge: 2024-07-25 | DRG: 189 | Disposition: A | Discharge status: Skilled Nursing Facility | Attending: Family Medicine | Admitting: Family Medicine

## 2024-07-14 ENCOUNTER — Emergency Department

## 2024-07-14 ENCOUNTER — Inpatient Hospital Stay

## 2024-07-14 ENCOUNTER — Inpatient Hospital Stay: Admit: 2024-07-14

## 2024-07-14 DIAGNOSIS — J9601 Acute respiratory failure with hypoxia: Principal | ICD-10-CM

## 2024-07-14 DIAGNOSIS — J90 Pleural effusion, not elsewhere classified: Secondary | ICD-10-CM | POA: Diagnosis present

## 2024-07-14 DIAGNOSIS — R000 Tachycardia, unspecified: Secondary | ICD-10-CM

## 2024-07-14 DIAGNOSIS — E440 Moderate protein-calorie malnutrition: Secondary | ICD-10-CM | POA: Insufficient documentation

## 2024-07-14 DIAGNOSIS — R0689 Other abnormalities of breathing: Secondary | ICD-10-CM | POA: Diagnosis not present

## 2024-07-14 DIAGNOSIS — R1310 Dysphagia, unspecified: Secondary | ICD-10-CM | POA: Diagnosis not present

## 2024-07-14 DIAGNOSIS — Z7189 Other specified counseling: Secondary | ICD-10-CM | POA: Diagnosis not present

## 2024-07-14 DIAGNOSIS — R0902 Hypoxemia: Secondary | ICD-10-CM | POA: Diagnosis not present

## 2024-07-14 LAB — BASIC METABOLIC PANEL WITH GFR
Anion gap: 12 (ref 5–15)
BUN: 17 mg/dL (ref 8–23)
CO2: 21 mmol/L — ABNORMAL LOW (ref 22–32)
Calcium: 8.9 mg/dL (ref 8.9–10.3)
Chloride: 97 mmol/L — ABNORMAL LOW (ref 98–111)
Creatinine, Ser: 0.72 mg/dL (ref 0.44–1.00)
GFR, Estimated: >60 mL/min (ref >60)
Glucose, Bld: 140 mg/dL — ABNORMAL HIGH (ref 70–99)
Potassium: 4 mmol/L (ref 3.5–5.1)
Sodium: 130 mmol/L — ABNORMAL LOW (ref 135–145)

## 2024-07-14 LAB — CBC
HCT: 46.9 % — ABNORMAL HIGH (ref 36.0–46.0)
Hemoglobin: 15.8 g/dL — ABNORMAL HIGH (ref 12.0–15.0)
MCH: 32.3 pg (ref 26.0–34.0)
MCHC: 33.7 g/dL (ref 30.0–36.0)
MCV: 95.9 fL (ref 80.0–100.0)
Platelets: 228 K/uL (ref 150–400)
RBC: 4.89 MIL/uL (ref 3.87–5.11)
RDW: 13.2 % (ref 11.5–15.5)
WBC: 16.5 K/uL — ABNORMAL HIGH (ref 4.0–10.5)
nRBC: 0 % (ref 0.0–0.2)

## 2024-07-14 LAB — BODY FLUID CELL COUNT WITH DIFFERENTIAL
Eos, Fluid: 0 %
Lymphs, Fluid: 1 %
Monocyte-Macrophage-Serous Fluid: 5 %
Neutrophil Count, Fluid: 94 %
Total Nucleated Cell Count, Fluid: 9866 uL

## 2024-07-14 LAB — HEPATIC FUNCTION PANEL
ALT: 9 U/L (ref 0–44)
AST: 28 U/L (ref 15–41)
Albumin: 3.2 g/dL — ABNORMAL LOW (ref 3.5–5.0)
Alkaline Phosphatase: 70 U/L (ref 38–126)
Bilirubin, Direct: 0.8 mg/dL — ABNORMAL HIGH (ref 0.0–0.2)
Indirect Bilirubin: 0.6 mg/dL (ref 0.3–0.9)
Total Bilirubin: 1.4 mg/dL — ABNORMAL HIGH (ref 0.0–1.2)
Total Protein: 7.5 g/dL (ref 6.5–8.1)

## 2024-07-14 LAB — RESP PANEL BY RT-PCR (RSV, FLU A&B, COVID)  RVPGX2
Influenza A by PCR: NEGATIVE
Influenza B by PCR: NEGATIVE
Resp Syncytial Virus by PCR: NEGATIVE
SARS Coronavirus 2 by RT PCR: NEGATIVE

## 2024-07-14 LAB — PROTEIN, PLEURAL OR PERITONEAL FLUID: Total protein, fluid: 5.1 g/dL

## 2024-07-14 LAB — ALBUMIN, PLEURAL OR PERITONEAL FLUID: Albumin, Fluid: 2 g/dL

## 2024-07-14 LAB — D-DIMER, QUANTITATIVE: D-Dimer, Quant: 10.55 ug{FEU}/mL — ABNORMAL HIGH (ref 0.00–0.50)

## 2024-07-14 LAB — LACTIC ACID, PLASMA: Lactic Acid, Venous: 1.9 mmol/L (ref 0.5–1.9)

## 2024-07-14 LAB — TROPONIN T, HIGH SENSITIVITY
Troponin T High Sensitivity: <15 ng/L (ref 0–19)
Troponin T High Sensitivity: <15 ng/L (ref 0–19)

## 2024-07-14 LAB — LACTATE DEHYDROGENASE, PLEURAL OR PERITONEAL FLUID: LD, Fluid: 2076 U/L — ABNORMAL HIGH (ref 3–23)

## 2024-07-14 LAB — DIGOXIN LEVEL: Digoxin Level: <0.6 ng/mL — ABNORMAL LOW (ref 0.8–2.0)

## 2024-07-14 LAB — GLUCOSE, PLEURAL OR PERITONEAL FLUID: Glucose, Fluid: 108 mg/dL

## 2024-07-14 LAB — PRO BRAIN NATRIURETIC PEPTIDE: Pro Brain Natriuretic Peptide: 678 pg/mL — ABNORMAL HIGH (ref <300.0)

## 2024-07-14 MED ORDER — DIGOXIN 125 MCG PO TABS
0.1250 mg | ORAL_TABLET | Freq: Every day | ORAL | Status: DC
  Administered 2024-07-14 – 2024-07-25 (×12): 0.125 mg via ORAL
  Filled 2024-07-14 (×12): qty 1

## 2024-07-14 MED ORDER — ALBUTEROL SULFATE (2.5 MG/3ML) 0.083% IN NEBU: 2.5000 mg | INHALATION_SOLUTION | RESPIRATORY_TRACT | Status: DC | PRN

## 2024-07-14 MED ORDER — DULOXETINE HCL 30 MG PO CPEP
60.0000 mg | ORAL_CAPSULE | Freq: Two times a day (BID) | ORAL | Status: DC
  Administered 2024-07-14 – 2024-07-25 (×23): 60 mg via ORAL
  Filled 2024-07-14 (×3): qty 2
  Filled 2024-07-14: qty 1
  Filled 2024-07-14 (×19): qty 2

## 2024-07-14 MED ORDER — BISACODYL 5 MG PO TBEC
5.0000 mg | DELAYED_RELEASE_TABLET | Freq: Every day | ORAL | Status: DC | PRN
  Administered 2024-07-18: 5 mg via ORAL
  Filled 2024-07-14: qty 1

## 2024-07-14 MED ORDER — ONDANSETRON HCL 4 MG PO TABS: 4.0000 mg | ORAL_TABLET | Freq: Four times a day (QID) | ORAL | Status: DC | PRN

## 2024-07-14 MED ORDER — ACETAMINOPHEN 650 MG RE SUPP: 650.0000 mg | Freq: Four times a day (QID) | RECTAL | Status: DC | PRN

## 2024-07-14 MED ORDER — IVABRADINE HCL 5 MG PO TABS
7.5000 mg | ORAL_TABLET | Freq: Two times a day (BID) | ORAL | Status: DC
  Administered 2024-07-14 – 2024-07-25 (×22): 7.5 mg via ORAL
  Filled 2024-07-14 (×25): qty 1

## 2024-07-14 MED ORDER — POLYETHYLENE GLYCOL 3350 17 G PO PACK
17.0000 g | PACK | Freq: Every day | ORAL | Status: DC
  Administered 2024-07-15 – 2024-07-25 (×8): 17 g via ORAL
  Filled 2024-07-14 (×11): qty 1

## 2024-07-14 MED ORDER — FUROSEMIDE 10 MG/ML IJ SOLN
40.0000 mg | Freq: Two times a day (BID) | INTRAMUSCULAR | Status: AC
  Administered 2024-07-14 – 2024-07-16 (×5): 40 mg via INTRAVENOUS
  Filled 2024-07-14 (×5): qty 4

## 2024-07-14 MED ORDER — IOHEXOL 350 MG/ML SOLN
75.0000 mL | Freq: Once | INTRAVENOUS | Status: AC | PRN
  Administered 2024-07-14: 75 mL via INTRAVENOUS

## 2024-07-14 MED ORDER — LIDOCAINE HCL (PF) 1 % IJ SOLN
10.0000 mL | Freq: Once | INTRAMUSCULAR | Status: AC
  Administered 2024-07-14: 10 mL

## 2024-07-14 MED ORDER — ONDANSETRON HCL 4 MG/2ML IJ SOLN: 4.0000 mg | Freq: Four times a day (QID) | INTRAMUSCULAR | Status: DC | PRN

## 2024-07-14 MED ORDER — IPRATROPIUM-ALBUTEROL 0.5-2.5 (3) MG/3ML IN SOLN
3.0000 mL | Freq: Four times a day (QID) | RESPIRATORY_TRACT | Status: AC
  Administered 2024-07-14 (×3): 3 mL via RESPIRATORY_TRACT
  Filled 2024-07-14 (×3): qty 3

## 2024-07-14 MED ORDER — LOSARTAN POTASSIUM 50 MG PO TABS: 25.0000 mg | ORAL_TABLET | Freq: Every day | ORAL | Status: DC

## 2024-07-14 MED ORDER — ACETAMINOPHEN 325 MG PO TABS
650.0000 mg | ORAL_TABLET | Freq: Four times a day (QID) | ORAL | Status: DC | PRN
  Administered 2024-07-14 – 2024-07-24 (×5): 650 mg via ORAL
  Filled 2024-07-14 (×7): qty 2

## 2024-07-14 NOTE — Procedures (Signed)
 PROCEDURE SUMMARY:  Successful image-guided diagnostic and therapeutic left-sided thoracentesis. Yielded 0.425 liters of clear-straw-colored pleural fluid. Patient tolerated procedure well. EBL: Zero No immediate complications.  Specimen was sent for labs. Post procedure CXR shows no pneumothorax.  Please see imaging section of Epic for full dictation.  [facility] LABOR Marrell Dicaprio PA-C 07/14/2024 4:18 PM

## 2024-07-14 NOTE — ED Triage Notes (Signed)
 Pt to ED BIB [facility] with c/o shortness of breath. Pt reports shortness of breath began a few days ago. Hx of sarcoidosis which she received infusions for. Denies chest pain, denies fevers. EMS reports pt was satting 84% on RA. Pt arrives on NRB. Changed to 5L. Pt satting 94%.

## 2024-07-14 NOTE — ED Notes (Signed)
 Pt to radiology for thoracentesis at this time.

## 2024-07-14 NOTE — ED Notes (Signed)
 RT to bedside at this time to place pt on high flow O2.

## 2024-07-14 NOTE — ED Provider Notes (Signed)
-----------------------------------------   8:51 AM on 07/14/2024 -----------------------------------------   I took over care of this patient from Dr. Ernest.  CTA is negative for PE.  The patient remained stable on 5 L O2 by nasal cannula.  She will need admission for further management.  I consulted Dr. Lenon from the hospitalist service; based on our discussion she agrees to evaluate the patient for admission.   Jacolyn Pae, MD 07/14/24 [phone]

## 2024-07-14 NOTE — ED Notes (Signed)
 Pt's O2 saturation in the 80's on 5L via [place]. Pt placed on NRB mask and RT called.

## 2024-07-14 NOTE — ED Notes (Signed)
 Assumed care of pt from [employer]. Introduced self to pt. Pt has call bell in reach. No needs at this time.

## 2024-07-14 NOTE — H&P (Addendum)
 History and Physical  Trinisha Paget FMW:[id number] DOB: 09/20/1950 DOA: 07/14/2024 PCP: [facility]  Chief Complaint: shortness of breath Historian: patient  HPI:  Yuritzy Zehring is a 73 y.o. 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 At baseline, they live independently and independently complete their ADLs.  They presented from home to the ED on 07/14/2024 with shortness of breath which has been gradually worsening for weeks but significantly worsened yesterday. Patient states that she has had mild exertional dyspnea gradually worsening over the past few weeks.  Starting yesterday, she was unable to speak in full sentences or take deep breaths.  She has had excessive cough without any exudate.  Denies any rhinorrhea or sick contacts.  Not high risk for TB exposure.  Denies long periods of inactivity.  Denies any known history of heart failure.  Denies orthopnea. Denies lower extremity swelling.  Denies having any echocardiograms that she can recall.  She had lower extremity amputation in 2019 and has been healing well and adherent with her apixaban .  Her last dose was the morning of 11/23.  She endorses chest pain with breathing but not with exertion. Denies any known pulmonary diseases and does not take any inhalers or use oxygen at baseline.  In the ED, it was found that they had hypoxia to mid 80s on room air which improved to low 90s with 5 L nasal cannula.  Sinus tachycardia up to 120.  Respiratory rate 29-36.  Temperature 99.3 F.  Significant findings included: Na+ 138, K+ 4.0, Cl- 97, glucose 148, creatinine 0.72.  WBC 16.5, hemoglobin 15.8.  Troponin negative.  BNP 678 CTA chest: Negative PE.  Moderate bilateral pleural effusions, left greater than right.  Aortic atherosclerosis and  emphysema.  They were initially treated with nothing.   Patient was admitted to medicine service for further workup and management of acute hypoxic respiratory failure as outlined in detail below.  Assessment/Plan Principal Problem:   Pleural effusion   Acute hypoxic respiratory failure  bilateral pleural effusions-requiring 5 L nasal cannula with no baseline oxygen use.  Conversational dyspnea present, speaking only in short sentences.  Effusions, left greater than right, moderate-seen on CTA.  Negative for PE.  Evidence of emphysema on CTA. no wheezing on exam. 50 pack year smoking history (quit >30 years ago). Does have history of sarcoidosis. Suspect etiology is complication of sarcoidosis, heart failure.  BNP 638.  No lower extremity edema. Does not appear to have infectious symptoms and will not start antibiotics at this time.  Negative RVP, flu, COVID.  WBC 16.5. LA 1.9. afebrile.  - Given evidence of emphysema, will trial with breathing treatments.  Suspect related to sarcoidosis.  Consider pulmonology consult. - Consulted IR for diagnostic, therapeutic thoracentesis.  Eliquis  is being held and remains n.p.o. at this time until I hear back from Dr Hughes on scheduling.   - Diagnostic labs ordered -Restart Eliquis  as soon as possible as patient is hypercoagulable - IV Lasix  40 mg every 12 hours - Strict I's/O - Maintain O2 saturations 92% or greater.  Confirmed with patient that she does not want to be intubated and wishes to be DNR at this time - Complete TTE ordered to assess for cardiac function-known MV regurgitation but unaware where this information was diagnosed as I do not see a history of echocardiogram UPDATE:  spoke with Dr. Hughes. Tentatively planning L thoracentesis today and R tomorrow.   Mild hyponatremia-suspect hypervolemic. - BMP in a.m. and monitor with diuretic treatment  Sinus tachycardia-Long history of. Digoxin  level on presentation: <0.6 - Continue  patient's home digoxin , ivabradine   PAD  CAD  HTN - holding losartan  while on diuretics as her BP is WNL - BMP am  S/p right AKA-uses prosthetic leg for ambulation  Sarcoidosis  History of lower back pain, limb pain - Continue home duloxetine  and gabapentin  - Spinal cord stimulator in place  Past Medical History:  Diagnosis Date   Acid reflux 03/09/2015   Acute non-recurrent maxillary sinusitis 07/14/2015   Arthritis    right shoulder   Depression    Dyspnea    Employs prosthetic leg    Right   Gastritis    Gastritis and gastroduodenitis    GCA (giant cell arteritis) (HCC)    GERD (gastroesophageal reflux disease)    History of blood clots    Hyperlipidemia    Hypertension    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 Jama   Vertigo    Last episode approx Aug 2015   Vertigo, benign paroxysmal 12/28/2015   Wears dentures    full upper   Past Surgical History:  Procedure Laterality Date   ADENOIDECTOMY     AMPUTATION Right 08/11/2020   Procedure: AMPUTATION ABOVE KNEE;  Surgeon: Jama Cordella MATSU, MD;  Location: [facility];  Service: Vascular;  Laterality: Right;   ARTERY BIOPSY Right 07/14/2019   Procedure: BIOPSY TEMPORAL ARTERY;  Surgeon: Jama Cordella MATSU, MD;  Location: [facility];  Service: Vascular;  Laterality: Right;   BREAST CYST ASPIRATION Left    CARDIAC CATHETERIZATION  02/15/2017   [facility]   COLONOSCOPY WITH PROPOFOL  N/A 10/22/2017   Procedure: COLONOSCOPY WITH PROPOFOL ;  Surgeon: Jinny Carmine, MD;  Location: [facility];  Service: Endoscopy;  Laterality: N/A;  specimens not taken--pt on Plavix  will be brought back in after 7 days off med   COLONOSCOPY WITH PROPOFOL  N/A 11/05/2017   Procedure: COLONOSCOPY WITH PROPOFOL ;  Surgeon:  Jinny Carmine, MD;  Location: [facility];  Service: Endoscopy;  Laterality: N/A;   COLONOSCOPY WITH PROPOFOL  N/A 11/13/2022   Procedure: COLONOSCOPY WITH PROPOFOL ;  Surgeon: Jinny Carmine, MD;  Location: [facility];  Service: Endoscopy;  Laterality: N/A;   ESOPHAGOGASTRODUODENOSCOPY N/A 12/21/2014   Procedure: ESOPHAGOGASTRODUODENOSCOPY (EGD);  Surgeon: Carmine Jinny, MD;  Location: [facility];  Service: Gastroenterology;  Laterality: N/A;   ESOPHAGOGASTRODUODENOSCOPY (EGD) WITH PROPOFOL  N/A 02/08/2016   Procedure: ESOPHAGOGASTRODUODENOSCOPY (EGD) WITH PROPOFOL ;  Surgeon: Carmine Jinny, MD;  Location: [facility];  Service: Endoscopy;  Laterality: N/A;   FASCIOTOMY Right 05/30/2019   Procedure: FASCIOTOMY;  Surgeon: Jama Cordella MATSU, MD;  Location: [facility];  Service: Vascular;  Laterality: Right;   FASCIOTOMY CLOSURE Right 06/04/2019   Procedure: FASCIOTOMY CLOSURE;  Surgeon: Jama Cordella MATSU, MD;  Location: [facility];  Service: Vascular;  Laterality: Right;   HEMORROIDECTOMY  2014   LOWER EXTREMITY ANGIOGRAPHY Right 05/30/2019   Procedure: LOWER EXTREMITY ANGIOGRAPHY;  Surgeon: Jama Cordella MATSU, MD;  Location: [facility];  Service: Cardiovascular;  Laterality: Right;   LOWER EXTREMITY ANGIOGRAPHY Right 07/02/2020   Procedure:  LOWER EXTREMITY ANGIOGRAPHY;  Surgeon: Jama Cordella MATSU, MD;  Location: [facility];  Service: Cardiovascular;  Laterality: Right;   PERIPHERAL VASCULAR CATHETERIZATION N/A 02/09/2016   Procedure: Abdominal Aortogram w/Lower Extremity;  Surgeon: Cordella MATSU Jama, MD;  Location: [facility];  Service: Cardiovascular;  Laterality: N/A;   PERIPHERAL VASCULAR CATHETERIZATION Right 02/10/2016   Procedure: Lower Extremity Angiography;  Surgeon: Cordella MATSU Jama, MD;  Location: [facility];  Service: Cardiovascular;  Laterality: Right;   POLYPECTOMY  11/05/2017   Procedure: POLYPECTOMY;  Surgeon: Jinny Carmine,  MD;  Location: [facility];  Service: Endoscopy;;   POLYPECTOMY  11/13/2022   Procedure: POLYPECTOMY;  Surgeon: Jinny Carmine, MD;  Location: [facility];  Service: Endoscopy;;   Sarcoidos     bone   SHOULDER ARTHROSCOPY WITH ROTATOR CUFF REPAIR AND SUBACROMIAL DECOMPRESSION Right 02/27/2020   Procedure: RIGHT SHOULDER ARTHROSCOPY SUBACROMIAL DECOMPRESSION, DISTAL CLAVICLE EXCISION AND MINI-OPEN ROTATOR CUFF REPAIR;  Surgeon: Marchia Drivers, MD;  Location: [facility];  Service: Orthopedics;  Laterality: Right;   THORACIC LAMINECTOMY FOR SPINAL CORD STIMULATOR N/A 08/28/2023   Procedure: THORACIC LAMINECTOMY FOR SPINAL CORD STIMULATOR PLACEMENT (MEDTRONIC);  Surgeon: Claudene Penne ORN, MD;  Location: [facility];  Service: Neurosurgery;  Laterality: N/A;   TONSILLECTOMY     VASCULAR SURGERY  [phone]   Fem-Pop Bypass    reports that she quit smoking about 32 years ago. Her smoking use included cigarettes. She started smoking about 57 years ago. She has a 50 pack-year smoking history. She has never used smokeless tobacco. She reports that she does not drink alcohol and does not use drugs.  Allergies  Allergen Reactions   Zolpidem  Anaphylaxis and Other (See Comments)   Family History  Problem Relation Age of Onset   CVA Mother    Heart attack Mother    Cancer Father        colon cancer   Colon cancer Father    Heart disease Maternal Uncle    Breast cancer Neg Hx    Prior to Admission medications   Medication Sig Start Date End Date Taking? Authorizing Provider  apixaban  (ELIQUIS ) 2.5 MG TABS tablet Take 2.5 mg by mouth 2 (two) times daily.   Yes [provider]  atorvastatin  (LIPITOR) 40 MG tablet Take 40 mg by mouth at bedtime.  12/08/16  Yes [provider]  digoxin  (LANOXIN ) 0.125 MG tablet Take 0.125 mg by mouth daily. 06/06/22  Yes [provider]  DULoxetine  (CYMBALTA ) 60 MG capsule TAKE 1 CAPSULE BY MOUTH TWICE A DAY 11/13/22  Yes Boswell,  Chelsa, NP  gabapentin  (NEURONTIN ) 400 MG tablet Take 400 mg by mouth 3 (three) times daily.   Yes [provider]  ivabradine  (CORLANOR ) 7.5 MG TABS tablet Take 1 tablet (7.5 mg total) by mouth 2 (two) times daily with a meal. 06/11/23  Yes Fernand Denyse LABOR, MD  losartan  (COZAAR ) 25 MG tablet Take 25 mg by mouth every morning.  12/01/16  Yes [provider]  meloxicam  (MOBIC ) 15 MG tablet Take 1 tablet (15 mg total) by mouth daily. 06/03/24  Yes Scoggins, Amber, NP  triamcinolone  ointment (KENALOG ) 0.5 % Apply 1 Application topically 2 (two) times daily. 03/29/23  Yes Scoggins, Amber, NP  Blood Pressure Monitoring (BLOOD PRESSURE CUFF) MISC 1 Units by Does not apply route daily as needed. 04/02/23   Orlean Alan HERO, FNP  Calcium  Carb-Cholecalciferol  600-10 MG-MCG TABS Take 1 tablet by mouth 2 (two) times daily.  [provider]  cariprazine  (VRAYLAR ) 1.5 MG capsule Take 1 capsule (1.5 mg total) by mouth daily. Patient not taking: Reported on 07/14/2024 02/15/24   Scoggins, [facility], NP   I have personally, briefly reviewed patient's prior medical records in [facility]  Objective: Blood pressure 127/77, pulse (!) 118, temperature 99.3 F (37.4 C), temperature source Oral, resp. rate (!) 29, weight 59.6 kg, SpO2 92%.   Constitutional: NAD, calm, comfortable HEENT: lids and conjunctivae normal.  Dry mucous membranes.  Dentures.  No rhinorrhea, clear oropharynx. Respiratory: Tachypnea.  Speaking in short sentences with low raspy voice.  Shallow breaths.  No wheezing.  Blunted breath sounds bilaterally to mid chest. Cardiovascular: Rapid and regular heart rate. No extremity edema. no clubbing / cyanosis.  Abdomen: soft, NT, ND, no masses or HSM palpated. Musculoskeletal: Right AKA without abnormalities Skin: dry, intact, normal color, normal temperature on exposed skin Neurologic: Alert and oriented x 3. Grossly non-focal exam. PERRL Psychiatric: Normal mood.  Congruent affect.  Labs on Admission: I have personally reviewed admission labs and imaging studies  CBC    Component Value Date/Time   WBC 16.5 (H) 07/14/2024 0426   RBC 4.89 07/14/2024 0426   HGB 15.8 (H) 07/14/2024 0426   HGB 15.3 10/02/2022 1044   HCT 46.9 (H) 07/14/2024 0426   HCT 45.9 10/02/2022 1044   PLT 228 07/14/2024 0426   PLT 222 09/21/2015 0916   MCV 95.9 07/14/2024 0426   MCV 94 10/02/2022 1044   MCH 32.3 07/14/2024 0426   MCHC 33.7 07/14/2024 0426   RDW 13.2 07/14/2024 0426   RDW 12.9 10/02/2022 1044   LYMPHSABS 1.7 12/19/2023 1129   LYMPHSABS 0.5 (L) 10/02/2022 1044   MONOABS 1.1 (H) 12/19/2023 1129   EOSABS 0.3 12/19/2023 1129   EOSABS 0.2 10/02/2022 1044   BASOSABS 0.1 12/19/2023 1129   BASOSABS 0.1 10/02/2022 1044   CMP     Component Value Date/Time   NA 130 (L) 07/14/2024 0515   NA 140 06/16/2024 1036   K 4.0 07/14/2024 0515   CL 97 (L) 07/14/2024 0515   CO2 21 (L) 07/14/2024 0515   GLUCOSE 140 (H) 07/14/2024 0515   BUN 17 07/14/2024 0515   BUN 8 06/16/2024 1036   CREATININE 0.72 07/14/2024 0515   CREATININE 0.88 05/16/2016 1034   CALCIUM  8.9 07/14/2024 0515   PROT 7.5 07/14/2024 0616   PROT 7.4 06/16/2024 1036   ALBUMIN 3.2 (L) 07/14/2024 0616   ALBUMIN 4.0 06/16/2024 1036   AST 28 07/14/2024 0616   ALT 9 07/14/2024 0616   ALKPHOS 70 07/14/2024 0616   BILITOT 1.4 (H) 07/14/2024 0616   BILITOT 0.7 06/16/2024 1036   GFRNONAA >60 07/14/2024 0515   GFRNONAA 66 05/01/2016 0846   GFRAA >60 02/18/2020 0949   GFRAA 76 05/01/2016 0846    Radiological Exams on Admission: CT Angio Chest PE W and/or Wo Contrast Result Date: 07/14/2024 CLINICAL DATA:  Concern for pulmonary embolism. EXAM: CT ANGIOGRAPHY CHEST WITH CONTRAST TECHNIQUE: Multidetector CT imaging of the chest was performed using the standard protocol during bolus administration of intravenous contrast. Multiplanar CT image reconstructions and MIPs were obtained to evaluate the vascular  anatomy. RADIATION DOSE REDUCTION: This exam was performed according to the departmental dose-optimization program which includes automated exposure control, adjustment of the mA and/or kV according to patient size and/or use of iterative reconstruction technique. CONTRAST:  75mL OMNIPAQUE  IOHEXOL  350 MG/ML SOLN COMPARISON:  Chest radiograph dated 07/14/2024. FINDINGS: Evaluation of this exam  is limited due to respiratory motion. Cardiovascular: There is no cardiomegaly or pericardial effusion. There is mild atherosclerotic calcification of the thoracic aorta. No as well dilatation or dissection. Five evaluation of the pulmonary arteries is limited due to respiratory motion and suboptimal visualization of the peripheral branches. No central pulmonary artery embolus identified. Mediastinum/Nodes: No obvious hilar adenopathy. Subcarinal lymph node measures 14 mm short axis. The esophagus is grossly unremarkable. No mediastinal fluid collection. Lungs/Pleura: Moderate bilateral pleural effusions, left greater than right. There is associated partial compressive atelectasis of the lower lobes. Pneumonia is not excluded follow-up to resolution recommended. There is background of emphysema. Upper Abdomen: No acute abnormality. Musculoskeletal: No acute osseous pathology. Lower thoracic spinal stimulator. Review of the MIP images confirms the above findings. IMPRESSION: 1. No CT evidence of central pulmonary artery embolus. 2. Moderate bilateral pleural effusions, left greater than right. 3. Aortic Atherosclerosis (ICD10-I70.0) and Emphysema (ICD10-J43.9). Electronically Signed   By: Vanetta Chou M.D.   On: 07/14/2024 08:20   DG Chest Port 1 View Result Date: 07/14/2024 EXAM: 1 VIEW(S) XRAY OF THE CHEST 07/14/2024 04:40:40 AM COMPARISON: 12/12/2023 CLINICAL HISTORY: 73 year old female with shortness of breath. FINDINGS: LINES, TUBES AND DEVICES: Neural stimulator leads over lower thoracic spine. LUNGS AND PLEURA:  Lower lung volumes. Small left greater than right pleural effusions appear new. Left apical pleural thickening. No pneumothorax. HEART AND MEDIASTINUM: Atherosclerotic plaque. No acute abnormality of the cardiac and mediastinal silhouettes. BONES AND SOFT TISSUES: No acute osseous abnormality. IMPRESSION: 1. Small left greater than right pleural effusions, new since 12/12/2023. 2. No other acute cardiopulmonary abnormality by portable CXR. Electronically signed by: Helayne Hurst MD 07/14/2024 05:11 AM EST RP Workstation: [id number]   EKG: Independently reviewed.  Sinus tachycardia  DVT prophylaxis: SCDs Start: 07/14/24 0900  Code Status: DNR.  Patient confirmed that she does not wish to be intubated or undergo chest compressions or shock in the event of cardiac arrest.  She does desire thorough interventions short of resuscitation. Family Communication: None at bedside Disposition Plan: Admit to inpatient Consults called: IR  Marien LITTIE Piety, DO [facility]  07/14/2024, 9:01 AM    To contact the appropriate [facility] Attending or Consulting provider: Check [email or web address] for coverage from 7pm-7am

## 2024-07-14 NOTE — ED Provider Notes (Addendum)
 [facility] Provider Note    Event Date/Time   First MD Initiated Contact with Patient 07/14/24 0406     (approximate)   History   Shortness of Breath   HPI  Sara [name] is a 73 y.o. female with sarcoidosis, sinus tachycardia who comes in with concerns for shortness of breath.  Patient reports feeling short of breath for the past week that has been gradually getting worse.  Patient was noted to be 84% on room air.  She denies typically being on any oxygen.  She denies any chest pain, cough, fever, leg swelling or any other concerns.  Denies any abdominal pain, falls or hitting her head or any other concern.  She does report some decreased p.o. intake just secondary to not being hungry or thirsty.   Physical Exam   Triage Vital Signs: ED Triage Vitals  Encounter Vitals Group     BP 07/14/24 0414 138/77     Girls Systolic BP Percentile --      Girls Diastolic BP Percentile --      Boys Systolic BP Percentile --      Boys Diastolic BP Percentile --      Pulse Rate 07/14/24 0414 (!) 128     Resp 07/14/24 0414 (!) 36     Temp 07/14/24 0414 99.3 F (37.4 C)     Temp Source 07/14/24 0414 Oral     SpO2 07/14/24 0414 94 %     Weight 07/14/24 0411 131 lb 6.4 oz (59.6 kg)     Height --      Head Circumference --      Peak Flow --      Pain Score 07/14/24 0414 0     Pain Loc --      Pain Education --      Exclude from Growth Chart --     Most recent vital signs: Vitals:   07/14/24 0414  BP: 138/77  Pulse: (!) 128  Resp: (!) 36  Temp: 99.3 F (37.4 C)  SpO2: 94%     General: Awake, no distress.  CV:  Good peripheral perfusion.  Tachycardic Resp:  Normal effort.  No wheezing noted Abd:  No distention.  Soft and nontender Other:  No swelling in leg.  Patient has above-knee amputation on the right no calf tenderness   ED Results / Procedures / Treatments   Labs (all labs ordered are listed, but only abnormal results are  displayed) Labs Reviewed  RESP PANEL BY RT-PCR (RSV, FLU A&B, COVID)  [id number]  BASIC METABOLIC PANEL WITH GFR  CBC  PRO BRAIN NATRIURETIC PEPTIDE  HEPATIC FUNCTION PANEL  D-DIMER, QUANTITATIVE  TROPONIN T, HIGH SENSITIVITY     EKG  My interpretation of EKG: Sinus tachycardia rate of 128 without any ST elevation no T wave inversions, normal intervals   RADIOLOGY I have reviewed the xray personally and interpreted patient has some pleural effusions noted bilaterally   PROCEDURES:  Critical Care performed: Yes, see critical care procedure note(s)  .1-3 Lead EKG Interpretation  Performed by: Ernest Ronal BRAVO, MD Authorized by: Ernest Ronal BRAVO, MD     Interpretation: abnormal     ECG rate:  120   ECG rate assessment: tachycardic     Rhythm: sinus tachycardia     Ectopy: none     Conduction: normal   .Critical Care  Performed by: Ernest Ronal BRAVO, MD Authorized by: Ernest Ronal BRAVO, MD   Critical care provider statement:  Critical care time (minutes):  30   Critical care was necessary to treat or prevent imminent or life-threatening deterioration of the following conditions:  Respiratory failure   Critical care was time spent personally by me on the following activities:  Development of treatment plan with patient or surrogate, discussions with consultants, evaluation of patient's response to treatment, examination of patient, ordering and review of laboratory studies, ordering and review of radiographic studies, ordering and performing treatments and interventions, pulse oximetry, re-evaluation of patient's condition and review of old charts    MEDICATIONS ORDERED IN ED: Medications - No data to display   IMPRESSION / MDM / ASSESSMENT AND PLAN / ED COURSE  I reviewed the triage vital signs and the nursing notes.   Patient's presentation is most consistent with acute presentation with potential threat to life or bodily function.   Patient comes in with shortness of breath  hypoxic requiring oxygen.  Patient currently on 5 L per patient is tachycardic although does have a history of sinus tachycardia which she is on digoxin  for.  Workup was done evaluate for ACS, PE, pneumonia, CHF.  On fluids given the concern for x-ray showing new pleural effusions.  Lactate level normal.  COVID, flu were negative.  D-dimer is elevated CBC shows elevated white count.  Patient meets sepsis criteria but no obvious source for infection.  Patient's blood work continues to hemolyze.  Going to send out another creatinine.  Patient will be handed off to oncoming team pending CT imaging to rule out pulmonary embolism.  She denies any falls or hitting her head and she denies any abdominal pain.  Patient will require admission to the hospital for further evaluation    The patient is on the cardiac monitor to evaluate for evidence of arrhythmia and/or significant heart rate changes.      FINAL CLINICAL IMPRESSION(S) / ED DIAGNOSES   Final diagnoses:  Acute respiratory failure with hypoxia (HCC)  Tachycardia     Rx / DC Orders   ED Discharge Orders     None        Note:  This document was prepared using Dragon voice recognition software and may include unintentional dictation errors.   Ernest Ronal BRAVO, MD 07/14/24 [id number]    Ernest Ronal BRAVO, MD 07/14/24 [phone]

## 2024-07-15 ENCOUNTER — Inpatient Hospital Stay
Admit: 2024-07-15 | Discharge: 2024-07-15 | Disposition: A | Discharge status: Home / Self Care | Attending: Student in an Organized Health Care Education/Training Program | Admitting: Student in an Organized Health Care Education/Training Program

## 2024-07-15 DIAGNOSIS — R1310 Dysphagia, unspecified: Secondary | ICD-10-CM

## 2024-07-15 LAB — CBC
HCT: 49.9 % — ABNORMAL HIGH (ref 36.0–46.0)
Hemoglobin: 16 g/dL — ABNORMAL HIGH (ref 12.0–15.0)
MCH: 32.5 pg (ref 26.0–34.0)
MCHC: 32.1 g/dL (ref 30.0–36.0)
MCV: 101.2 fL — ABNORMAL HIGH (ref 80.0–100.0)
Platelets: 240 K/uL (ref 150–400)
RBC: 4.93 MIL/uL (ref 3.87–5.11)
RDW: 13.3 % (ref 11.5–15.5)
WBC: 13.9 K/uL — ABNORMAL HIGH (ref 4.0–10.5)
nRBC: 0 % (ref 0.0–0.2)

## 2024-07-15 LAB — BASIC METABOLIC PANEL WITH GFR
Anion gap: 16 — ABNORMAL HIGH (ref 5–15)
BUN: 24 mg/dL — ABNORMAL HIGH (ref 8–23)
CO2: 19 mmol/L — ABNORMAL LOW (ref 22–32)
Calcium: 8.5 mg/dL — ABNORMAL LOW (ref 8.9–10.3)
Chloride: 93 mmol/L — ABNORMAL LOW (ref 98–111)
Creatinine, Ser: 0.9 mg/dL (ref 0.44–1.00)
GFR, Estimated: >60 mL/min (ref >60)
Glucose, Bld: 127 mg/dL — ABNORMAL HIGH (ref 70–99)
Potassium: 3.6 mmol/L (ref 3.5–5.1)
Sodium: 128 mmol/L — ABNORMAL LOW (ref 135–145)

## 2024-07-15 LAB — ECHOCARDIOGRAM COMPLETE
S' Lateral: 2.5 cm
Weight: 2102.4 [oz_av]

## 2024-07-15 LAB — PATHOLOGIST SMEAR REVIEW

## 2024-07-15 MED ORDER — ADULT MULTIVITAMIN W/MINERALS CH
1.0000 | ORAL_TABLET | Freq: Every day | ORAL | Status: DC
  Administered 2024-07-15 – 2024-07-25 (×11): 1 via ORAL
  Filled 2024-07-15 (×11): qty 1

## 2024-07-15 MED ORDER — ENSURE PLUS HIGH PROTEIN PO LIQD
237.0000 mL | Freq: Three times a day (TID) | ORAL | Status: DC
  Administered 2024-07-15 – 2024-07-25 (×27): 237 mL via ORAL

## 2024-07-15 MED ORDER — APIXABAN 2.5 MG PO TABS
2.5000 mg | ORAL_TABLET | Freq: Two times a day (BID) | ORAL | Status: DC
  Administered 2024-07-15 – 2024-07-25 (×20): 2.5 mg via ORAL
  Filled 2024-07-15 (×20): qty 1

## 2024-07-15 NOTE — Plan of Care (Signed)

## 2024-07-15 NOTE — TOC CM/SW Note (Signed)
 Transition of Care [facility]) CM/SW Note    Transition of Care [facility]) - Inpatient Brief Assessment   Patient Details  Name: Sara [name] MRN: [id number] Date of Birth: Dec 20, 1950  [facility]) CM/SW Contact:    Alfonso Rummer, LCSW Phone Number: 07/15/2024, 3:57 PM   Clinical Narrative:  LCSW A. Parveen Freehling completed TOC chart review. No TOC needs identified please contact TOC should needs arise.   Transition of Care Asessment: Insurance and Status: Insurance coverage has been reviewed Patient has primary care physician: Yes ([facility]) Home environment has been reviewed: single family home   Prior/Current Home Services: No current home services Social Drivers of Health Review: SDOH reviewed no interventions necessary Readmission risk has been reviewed: No Transition of care needs: no transition of care needs at this time

## 2024-07-15 NOTE — Progress Notes (Signed)
 1      PROGRESS NOTE    Sara [name]  FMW:[id number] DOB: [birth year] DOA: 07/14/2024 PCP: [facility]    Brief Narrative:   73 y.o. 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   - She has conversational dyspnea and dyspnea on exertion - Effusions, left greater than right, moderate-seen on CTA.  Negative for PE.  Evidence of emphysema on CTA.  - Has 50 pack year smoking history (quit >30 years ago). Does have history of sarcoidosis. - Suspect etiology is complication of sarcoidosis, heart failure.  BNP 638.  No lower extremity edema. - Does not appear to have infectious symptoms and will not start antibiotics at this time.  Negative RVP, flu, COVID.  WBC 16.5. LA 1.9. afebrile.  - Given evidence of emphysema, will trial with breathing treatments.  Suspect related to sarcoidosis.   - Requested pulmonology consult.  Dr. Fernand aware - Status post diagnostic and therapeutic thoracentesis on 11/25.  - -Restart Eliquis  as patient is hypercoagulable - Continue IV Lasix  40 mg every 12 hours - Strict I's/O - Echo shows normal LV systolic function   Mild hyponatremia-suspect hypervolemic. - BMP in a.m. and monitor with diuretic treatment   Sinus tachycardia-Long history of. Digoxin  level on presentation: <0.6 - Continue patient's home digoxin , ivabradine    PAD  CAD  HTN - holding losartan  while on diuretics as her BP is low - BMP am   S/p  right AKA-uses prosthetic leg for ambulation   Sarcoidosis -pulmonary consult, Dr. Fernand aware   History of lower back pain, limb pain - Continue home duloxetine  and gabapentin  - Spinal cord stimulator in place  High risk for aspiration.  Dysphagia in the setting of decline pulmonary status with increased work of breathing Seen by speech therapy.  Dysphagia 3/mechanical soft with gravies ordered. Nutritionist consult  Goals of care Palliative care consult   DVT prophylaxis: Eliquis  SCDs Start: 07/14/24 0900     Code Status: DNR Family Communication: None at bedside Disposition Plan: Possible discharge in next 2 to 3 days depending on clinical condition and pulmonary workup   Consultants:  Pulmonary  Procedures: IR guided thoracentesis on 11/25     Subjective:  Feeling tired and short of breath  Objective: Vitals:   07/15/24 0817 07/15/24 0950 07/15/24 1128 07/15/24 1550  BP: 105/62  117/66 (!) 94/56  Pulse: 75 91 98 77  Resp: 19  20 (!) 24  Temp: (!) 97.5 F (36.4 C)  98.3 F (36.8 C) 97.9 F (36.6 C)  TempSrc:    Oral  SpO2: 92%  92% 90%  Weight:        Intake/Output Summary (Last 24 hours) at 07/15/2024 1729 Last data filed at 07/15/2024 1300 Gross per 24 hour  Intake 290 ml  Output 450 ml  Net -160 ml   Filed Weights   07/14/24 0411  Weight: 59.6 kg    Examination:  General exam: Appears calm and comfortable  Respiratory system: Speaking in short sentences with  low raspy voice, shallow breath.  Decreased breath sounds at the bases Cardiovascular system: S1 & S2 heard, RRR. No murmurs Gastrointestinal system: Abdomen is nondistended, soft and nontender. No organomegaly or masses felt. Normal bowel sounds heard. Central nervous system: Alert and oriented. No focal neurological deficits. Extremities: Symmetric 5 x 5 power. Skin: No rashes, lesions or ulcers Psychiatry: Judgement and insight appear normal. Mood & affect appropriate.     Data  Reviewed: I have personally reviewed following labs and imaging studies  CBC: Recent Labs  Lab 07/14/24 0426 07/15/24 0341  WBC 16.5* 13.9*  HGB 15.8* 16.0*  HCT 46.9* 49.9*  MCV 95.9 101.2*  PLT 228 240   Basic Metabolic Panel: Recent Labs  Lab 07/14/24 0515 07/15/24 0341  NA 130* 128*  K 4.0 3.6  CL 97* 93*  CO2 21* 19*  GLUCOSE 140* 127*  BUN 17 24*  CREATININE 0.72 0.90  CALCIUM  8.9 8.5*   GFR: Estimated Creatinine Clearance: 47.4 mL/min (by C-G formula based on SCr of 0.9 mg/dL). Liver Function Tests: Recent Labs  Lab 07/14/24 0616  AST 28  ALT 9  ALKPHOS 70  BILITOT 1.4*  PROT 7.5  ALBUMIN 3.2*    BNP (last 3 results) Recent Labs    07/14/24 0616  PROBNP 678.0*    Sepsis Labs: Recent Labs  Lab 07/14/24 0549  LATICACIDVEN 1.9    Recent Results (from the past 240 hours)  Resp panel by RT-PCR (RSV, Flu A&B, Covid) Anterior Nasal Swab     Status: None   Collection Time: 07/14/24  5:02 AM   Specimen: Anterior Nasal Swab  Result Value Ref Range Status   SARS Coronavirus 2 by RT PCR NEGATIVE NEGATIVE Final    Comment: (NOTE) SARS-CoV-2 target nucleic acids are NOT DETECTED.  The SARS-CoV-2 RNA is generally detectable in upper respiratory specimens during the acute phase of infection. The lowest concentration of SARS-CoV-2 viral copies this assay can detect is 138 copies/mL. A negative result does not preclude SARS-Cov-2 infection and should not be used as the sole basis for treatment or other patient management decisions. A negative result may occur with  improper specimen collection/handling, submission of specimen other than nasopharyngeal swab, presence of viral mutation(s) within the areas targeted by this assay, and inadequate number of viral copies(<138 copies/mL). A negative result must be combined with clinical observations, patient history, and epidemiological information. The expected result is Negative.  Fact Sheet for Patients:   bloggercourse.com  Fact Sheet for Healthcare Providers:  seriousbroker.it  This test is no t yet approved or cleared by the [place]  FDA and  has been authorized for detection and/or diagnosis of SARS-CoV-2 by FDA under an Emergency Use Authorization (EUA). This EUA will remain  in effect (meaning this test can be used) for the duration of the COVID-19 declaration under Section 564(b)(1) of the Act, 21 U.S.C.section 360bbb-3(b)(1), unless the authorization is terminated  or revoked sooner.       Influenza A by PCR NEGATIVE NEGATIVE Final   Influenza B by PCR NEGATIVE NEGATIVE Final    Comment: (NOTE) The Xpert Xpress SARS-CoV-2/FLU/RSV plus assay is intended as an aid in the diagnosis of influenza from Nasopharyngeal swab specimens and should not be used as a sole basis for treatment. Nasal washings and aspirates are unacceptable for Xpert Xpress SARS-CoV-2/FLU/RSV testing.  Fact Sheet for Patients: bloggercourse.com  Fact Sheet for Healthcare Providers: seriousbroker.it  This test is not yet approved or cleared by the [place]  FDA  and has been authorized for detection and/or diagnosis of SARS-CoV-2 by FDA under an Emergency Use Authorization (EUA). This EUA will remain in effect (meaning this test can be used) for the duration of the COVID-19 declaration under Section 564(b)(1) of the Act, 21 U.S.C. section 360bbb-3(b)(1), unless the authorization is terminated or revoked.     Resp Syncytial Virus by PCR NEGATIVE NEGATIVE Final    Comment: (NOTE) Fact Sheet for Patients: bloggercourse.com  Fact Sheet for Healthcare Providers: seriousbroker.it  This test is not yet approved or cleared by the [place]  FDA and has been authorized for detection and/or diagnosis of SARS-CoV-2 by FDA under an Emergency Use  Authorization (EUA). This EUA will remain in effect (meaning this test can be used) for the duration of the COVID-19 declaration under Section 564(b)(1) of the Act, 21 U.S.C. section 360bbb-3(b)(1), unless the authorization is terminated or revoked.  Performed at [facility], [address] [place], [place] [address]   Blood culture (routine x 2)     Status: None (Preliminary result)   Collection Time: 07/14/24  5:49 AM   Specimen: BLOOD  Result Value Ref Range Status   Specimen Description BLOOD BLOOD LEFT ARM  Final   Special Requests   Final    BOTTLES DRAWN AEROBIC AND ANAEROBIC Blood Culture results may not be optimal due to an inadequate volume of blood received in culture bottles   Culture   Final    NO GROWTH < 24 HOURS Performed at [facility], [address] [place], [place] [address]    Report Status PENDING  Incomplete  Blood culture (routine x 2)     Status: None (Preliminary result)   Collection Time: 07/14/24  5:49 AM   Specimen: BLOOD  Result Value Ref Range Status   Specimen Description BLOOD BLOOD LEFT ARM  Final   Special Requests   Final    BOTTLES DRAWN AEROBIC AND ANAEROBIC Blood Culture results may not be optimal due to an inadequate volume of blood received in culture bottles   Culture   Final    NO GROWTH < 24 HOURS Performed at [facility], [address] [place], [place] [address]    Report Status PENDING  Incomplete  Body fluid culture w Gram Stain     Status: None (Preliminary result)   Collection Time: 07/14/24  2:05 PM   Specimen: PATH Cytology Pleural fluid  Result Value Ref Range Status   Specimen Description   Final    PLEURAL Performed at [facility], [address], [place], [place] [address]    Special Requests   Final    NONE Performed at [facility], [address] [place], [place] [address]    Gram Stain   Final    FEW WBC PRESENT,BOTH PMN AND MONONUCLEAR NO ORGANISMS  SEEN    Culture   Final    NO GROWTH < 12 HOURS Performed at [facility], [address], [place], [place] [address]    Report Status PENDING  Incomplete         Radiology Studies: ECHOCARDIOGRAM COMPLETE Result Date: 07/15/2024    ECHOCARDIOGRAM REPORT   Patient Name:   Sara [name] Date of Exam: 07/15/2024 Medical Rec #:  [id number]            Height:       63.0 in Accession #:    [id number]           Weight:  131.4 lb Date of Birth:  Sep 11, 1950             BSA:          1.617 m Patient Age:    73 years             BP:           124/75 mmHg Patient Gender: F                    HR:           76 bpm. Exam Location:  [facility] Procedure: Cardiac Doppler and Color Doppler (Both Spectral and Color Flow            Doppler were utilized during procedure). 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 Signs/Symptoms:Dyspnea; Risk                  Factors:Hypertension.  Sonographer:     Christopher Furnace Referring Phys:  [id number] MARIEN LITTIE PIETY Diagnosing Phys: Annalee Custovic  Sonographer Comments: Technically challenging study due to limited acoustic windows, no apical window and no subcostal window. IMPRESSIONS  1. Left ventricular ejection fraction, by estimation, is 60 to 65%. The left ventricle has normal function. The left ventricle has no regional wall motion abnormalities. Left ventricular diastolic parameters were normal.  2. Right ventricular systolic function is normal. The right ventricular size is normal.  3. The mitral valve is normal in structure. Trivial mitral valve regurgitation. No evidence of mitral stenosis.  4. The aortic valve is normal in structure. Aortic valve regurgitation is not visualized. No aortic stenosis is present.  5. The inferior vena cava is normal in size with greater than 50% respiratory variability, suggesting right atrial pressure of 3 mmHg.  FINDINGS  Left Ventricle: Left ventricular ejection fraction, by estimation, is 60 to 65%. The left ventricle has normal function. The left ventricle has no regional wall motion abnormalities. The left ventricular internal cavity size was normal in size. There is  no left ventricular hypertrophy. Left ventricular diastolic parameters were normal. Right Ventricle: The right ventricular size is normal. No increase in right ventricular wall thickness. Right ventricular systolic function is normal. Left Atrium: Left atrial size was normal in size. Right Atrium: Right atrial size was normal in size. Pericardium: There is no evidence of pericardial effusion. Mitral Valve: The mitral valve is normal in structure. Trivial mitral valve regurgitation. No evidence of mitral valve stenosis. Tricuspid Valve: The tricuspid valve is normal in structure. Tricuspid valve regurgitation is trivial. Aortic Valve: The aortic valve is normal in structure. Aortic valve regurgitation is not visualized. No aortic stenosis is present. Pulmonic Valve: The pulmonic valve was normal in structure. Pulmonic valve regurgitation is trivial. Aorta: The aortic root is normal in size and structure. Venous: The inferior vena cava is normal in size with greater than 50% respiratory variability, suggesting right atrial pressure of 3 mmHg. IAS/Shunts: No atrial level shunt detected by color flow Doppler.  LEFT VENTRICLE PLAX 2D LVIDd:         3.70 cm LVIDs:         2.50 cm LV PW:         0.80 cm LV IVS:        1.00 cm LVOT  diam:     2.00 cm LVOT Area:     3.14 cm  RIGHT VENTRICLE RVOT diam:      3.10 cm  AORTA Ao Root diam: 3.70 cm  SHUNTS Systemic Diam: 2.00 cm Pulmonic Diam: 3.10 cm Annalee Custovic Electronically signed by Annalee Casa Signature Date/Time: 07/15/2024/2:02:49 PM    Final    US  THORACENTESIS ASP PLEURAL SPACE W/IMG GUIDE Result Date: 07/14/2024 INDICATION: [id number] Pleural effusion 7780 73 year old female presenting to ED with  progressively worsening dyspnea on exertion and shortness of breath. EXAM: ULTRASOUND GUIDED DIAGNOSTIC AND THERAPEUTIC THORACENTESIS MEDICATIONS: 5 mL of 1% lidocaine  COMPLICATIONS: None immediate. PROCEDURE: An ultrasound guided thoracentesis was thoroughly discussed with the patient and questions answered. The benefits, risks, alternatives and complications were also discussed. The patient understands and wishes to proceed with the procedure. Written consent was obtained. Ultrasound was performed to localize and mark an adequate pocket of fluid in the left chest. The area was then prepped and draped in the normal sterile fashion. 1% Lidocaine  was used for local anesthesia. Under ultrasound guidance a 6 Fr Safe-T-Centesis catheter was introduced. Thoracentesis was performed. The catheter was removed and a dressing applied. FINDINGS: A total of approximately 425 mL of clear-straw-colored pleural fluid was removed. Samples were sent to the laboratory as requested by the clinical team. IMPRESSION: Successful ultrasound guided LEFT thoracentesis yielding 425 mL of pleural fluid. Performed by: Carlin Griffon, PA-C under supervision of Thom Hall, MD Electronically Signed   By: Thom Hall M.D.   On: 07/14/2024 16:35   DG Chest Port 1 View Result Date: 07/14/2024 CLINICAL DATA:  Status post thoracentesis EXAM: PORTABLE CHEST 1 VIEW COMPARISON:  Chest x-ray 07/14/2024 FINDINGS: The heart size and mediastinal contours are within normal limits. There is stable elevation of the right hemidiaphragm. There are stable small bilateral pleural effusions. No new focal lung infiltrate or pneumothorax. The visualized skeletal structures are unremarkable. IMPRESSION: Stable small bilateral pleural effusions. No pneumothorax. Electronically Signed   By: Greig Pique M.D.   On: 07/14/2024 15:06   CT Angio Chest PE W and/or Wo Contrast Result Date: 07/14/2024 CLINICAL DATA:  Concern for pulmonary embolism. EXAM: CT  ANGIOGRAPHY CHEST WITH CONTRAST TECHNIQUE: Multidetector CT imaging of the chest was performed using the standard protocol during bolus administration of intravenous contrast. Multiplanar CT image reconstructions and MIPs were obtained to evaluate the vascular anatomy. RADIATION DOSE REDUCTION: This exam was performed according to the departmental dose-optimization program which includes automated exposure control, adjustment of the mA and/or kV according to patient size and/or use of iterative reconstruction technique. CONTRAST:  75mL OMNIPAQUE  IOHEXOL  350 MG/ML SOLN COMPARISON:  Chest radiograph dated 07/14/2024. FINDINGS: Evaluation of this exam is limited due to respiratory motion. Cardiovascular: There is no cardiomegaly or pericardial effusion. There is mild atherosclerotic calcification of the thoracic aorta. No as well dilatation or dissection. Five evaluation of the pulmonary arteries is limited due to respiratory motion and suboptimal visualization of the peripheral branches. No central pulmonary artery embolus identified. Mediastinum/Nodes: No obvious hilar adenopathy. Subcarinal lymph node measures 14 mm short axis. The esophagus is grossly unremarkable. No mediastinal fluid collection. Lungs/Pleura: Moderate bilateral pleural effusions, left greater than right. There is associated partial compressive atelectasis of the lower lobes. Pneumonia is not excluded follow-up to resolution recommended. There is background of emphysema. Upper Abdomen: No acute abnormality. Musculoskeletal: No acute osseous pathology. Lower thoracic spinal stimulator. Review of the MIP images confirms the above findings. IMPRESSION: 1. No CT evidence of  central pulmonary artery embolus. 2. Moderate bilateral pleural effusions, left greater than right. 3. Aortic Atherosclerosis (ICD10-I70.0) and Emphysema (ICD10-J43.9). Electronically Signed   By: Vanetta Chou M.D.   On: 07/14/2024 08:20   DG Chest Port 1 View Result Date:  07/14/2024 EXAM: 1 VIEW(S) XRAY OF THE CHEST 07/14/2024 04:40:40 AM COMPARISON: 12/12/2023 CLINICAL HISTORY: 73 year old female with shortness of breath. FINDINGS: LINES, TUBES AND DEVICES: Neural stimulator leads over lower thoracic spine. LUNGS AND PLEURA: Lower lung volumes. Small left greater than right pleural effusions appear new. Left apical pleural thickening. No pneumothorax. HEART AND MEDIASTINUM: Atherosclerotic plaque. No acute abnormality of the cardiac and mediastinal silhouettes. BONES AND SOFT TISSUES: No acute osseous abnormality. IMPRESSION: 1. Small left greater than right pleural effusions, new since 12/12/2023. 2. No other acute cardiopulmonary abnormality by portable CXR. Electronically signed by: Helayne Hurst MD 07/14/2024 05:11 AM EST RP Workstation: [id number]        Scheduled Meds:  digoxin   0.125 mg Oral Daily   DULoxetine   60 mg Oral BID   feeding supplement  237 mL Oral TID BM   furosemide   40 mg Intravenous Q12H   ivabradine   7.5 mg Oral BID WC   multivitamin with minerals  1 tablet Oral Daily   polyethylene glycol  17 g Oral Daily   Continuous Infusions:   LOS: 1 day    Time spent: 35 minutes    Cresencio Fairly, MD [facility] Pager [phone]  If 7PM-7AM, please contact night-coverage [email or web address]  07/15/2024, 5:29 PM

## 2024-07-15 NOTE — Evaluation (Signed)
 Clinical/Bedside Swallow Evaluation Patient Details  Name: Sara [name] MRN: [id number] Date of Birth: 12/30/1950  Today's Date: 07/15/2024 Time: SLP Start Time (ACUTE ONLY): 1100 SLP Stop Time (ACUTE ONLY): 1200 SLP Time Calculation (min) (ACUTE ONLY): 60 min  Past Medical History:  Past Medical History:  Diagnosis Date   Acid reflux 03/09/2015   Acute non-recurrent maxillary sinusitis 07/14/2015   Arthritis    right shoulder   Depression    Dyspnea    Employs prosthetic leg    Right   Gastritis    Gastritis and gastroduodenitis    GCA (giant cell arteritis) (HCC)    GERD (gastroesophageal reflux disease)    History of blood clots    Hyperlipidemia    Hypertension    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 Jama   Vertigo    Last episode approx Aug 2015   Vertigo, benign paroxysmal 12/28/2015   Wears dentures    full upper   Past Surgical History:  Past Surgical History:  Procedure Laterality Date   ADENOIDECTOMY     AMPUTATION Right 08/11/2020   Procedure: AMPUTATION ABOVE KNEE;  Surgeon: Jama Cordella MATSU, MD;  Location: [facility];  Service: Vascular;  Laterality: Right;   ARTERY BIOPSY Right 07/14/2019   Procedure: BIOPSY TEMPORAL ARTERY;  Surgeon: Jama Cordella MATSU, MD;  Location: [facility];  Service: Vascular;  Laterality: Right;   BREAST CYST ASPIRATION Left    CARDIAC CATHETERIZATION  02/15/2017   [facility]   COLONOSCOPY WITH PROPOFOL  N/A 10/22/2017   Procedure: COLONOSCOPY WITH PROPOFOL ;  Surgeon: Jinny Carmine, MD;  Location: [facility];  Service: Endoscopy;  Laterality: N/A;  specimens not taken--pt on Plavix  will be brought back in after 7 days off med   COLONOSCOPY WITH PROPOFOL  N/A 11/05/2017   Procedure: COLONOSCOPY WITH  PROPOFOL ;  Surgeon: Jinny Carmine, MD;  Location: [facility];  Service: Endoscopy;  Laterality: N/A;   COLONOSCOPY WITH PROPOFOL  N/A 11/13/2022   Procedure: COLONOSCOPY WITH PROPOFOL ;  Surgeon: Jinny Carmine, MD;  Location: [facility];  Service: Endoscopy;  Laterality: N/A;   ESOPHAGOGASTRODUODENOSCOPY N/A 12/21/2014   Procedure: ESOPHAGOGASTRODUODENOSCOPY (EGD);  Surgeon: Carmine Jinny, MD;  Location: [facility];  Service: Gastroenterology;  Laterality: N/A;   ESOPHAGOGASTRODUODENOSCOPY (EGD) WITH PROPOFOL  N/A 02/08/2016   Procedure: ESOPHAGOGASTRODUODENOSCOPY (EGD) WITH PROPOFOL ;  Surgeon: Carmine Jinny, MD;  Location: [facility];  Service: Endoscopy;  Laterality: N/A;   FASCIOTOMY Right 05/30/2019   Procedure: FASCIOTOMY;  Surgeon: Jama Cordella MATSU, MD;  Location: [facility];  Service: Vascular;  Laterality: Right;   FASCIOTOMY CLOSURE Right 06/04/2019   Procedure: FASCIOTOMY CLOSURE;  Surgeon: Jama Cordella MATSU, MD;  Location: [facility];  Service: Vascular;  Laterality: Right;   HEMORROIDECTOMY  2014   LOWER EXTREMITY ANGIOGRAPHY Right 05/30/2019   Procedure: LOWER EXTREMITY ANGIOGRAPHY;  Surgeon: Jama Cordella MATSU, MD;  Location: [facility];  Service: Cardiovascular;  Laterality: Right;   LOWER EXTREMITY ANGIOGRAPHY Right 07/02/2020   Procedure: LOWER EXTREMITY ANGIOGRAPHY;  Surgeon: Jama Cordella MATSU, MD;  Location: [facility];  Service: Cardiovascular;  Laterality: Right;   PERIPHERAL VASCULAR CATHETERIZATION N/A 02/09/2016   Procedure: Abdominal Aortogram w/Lower Extremity;  Surgeon: Cordella MATSU Jama, MD;  Location: [facility]  INVASIVE CV LAB;  Service: Cardiovascular;  Laterality: N/A;   PERIPHERAL VASCULAR CATHETERIZATION Right 02/10/2016   Procedure: Lower Extremity Angiography;  Surgeon: Cordella KANDICE Shawl, MD;  Location: [facility];  Service: Cardiovascular;  Laterality: Right;   POLYPECTOMY  11/05/2017   Procedure: POLYPECTOMY;   Surgeon: Jinny Carmine, MD;  Location: [facility];  Service: Endoscopy;;   POLYPECTOMY  11/13/2022   Procedure: POLYPECTOMY;  Surgeon: Jinny Carmine, MD;  Location: [facility];  Service: Endoscopy;;   Sarcoidos     bone   SHOULDER ARTHROSCOPY WITH ROTATOR CUFF REPAIR AND SUBACROMIAL DECOMPRESSION Right 02/27/2020   Procedure: RIGHT SHOULDER ARTHROSCOPY SUBACROMIAL DECOMPRESSION, DISTAL CLAVICLE EXCISION AND MINI-OPEN ROTATOR CUFF REPAIR;  Surgeon: Marchia Drivers, MD;  Location: [facility];  Service: Orthopedics;  Laterality: Right;   THORACIC LAMINECTOMY FOR SPINAL CORD STIMULATOR N/A 08/28/2023   Procedure: THORACIC LAMINECTOMY FOR SPINAL CORD STIMULATOR PLACEMENT (MEDTRONIC);  Surgeon: Claudene Penne ORN, MD;  Location: [facility];  Service: Neurosurgery;  Laterality: N/A;   TONSILLECTOMY     VASCULAR SURGERY  [phone]   Fem-Pop Bypass   HPI:  Pt is a 73 y.o. 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  At baseline, she lives independently and independently complete ADLs.     She presented from home to the ED on 07/14/2024 with shortness of breath which has been gradually worsening for weeks but significantly worsened yesterday.  Patient states that she has had mild exertional dyspnea gradually worsening over the past few weeks.  Starting yesterday, she was unable to speak in full sentences or take deep breaths.  She has had excessive cough without any exudate.  Patient states that she has had mild exertional dyspnea gradually worsening over the past few weeks but that she has been able to eat her meals w/out difficulty.  Patient was noted to be 84% on room air at admit.  She denies typically being on any oxygen.   She does report some decreased p.o.  intake just secondary to not being hungry or thirsty.  Post admit, left-sided thoracentesis performed.  Pt remains on [place] O2 support.   CT Imaging of Chest: Moderate bilateral pleural effusions, left greater  than right. There is associated partial compressive atelectasis of  the lower lobes. There is background of Emphysema.    Assessment / Plan / Recommendation  Clinical Impression   Pt appears to present w/ grossly functional oropharyngeal phase swallow when following general aspiration precautions and monitoring her Pulmonary status during oral intake. Pt presents w/ risk for aspiration/dysphagia as well as Reduced oral intake in setting of declined Pulmonary status and Esophageal phase dysmotility/GERD. Pt also requires support w/ tray setup and sitting up at meals d/t Deconditioned status. Frequent REST BREAKS required during any/all po intake/tasks d/t WOB/SOB secondary to the exertion increasing respiratory demand/effort.    Pt presents w/ dysphagia risk secondary to apparent impact from her current Pulmonary status/decline w/ need for increased O2 support(7L currently) and frequent REST BREAKS w/ any exertion/task to ease respiratory demand. ANY such significant Pulmonary decline can impact Apnea timing and airway closure during the swallow which can impact pharyngeal swallowing, airway protection, and increase risk for aspiration to occur thus further Pulmonary impact/decline. Pt also has Baseline dx of GERD; ANY Esophageal phase  Dysmotility or Regurgitation of Reflux material can increase risk for aspiration of the Reflux material during Retrograde flow thus impact Voicing and Pulmonary status.    During this evaluation, SINGLE trials of ice chips, sips of water  Via Cup, and single boluses of puree and mech soft food moistened well were presented w/ REST BREAKS b/t trials and careful monitoring of pt's RR(low-mid 20s). No overt, clinical s/s of aspiration noted w/ trials given: no cough/throat  clearing, no wet vocal quality, and respiratory effort appeared to only minimally increase w/ the effort from the exertion of the tasks/mastication but calmed again (to her baseline) w/ the REST BREAKS b/t trials. Swallowing of liquids was timely- no cough. Oral phase appeared grossly [facility] for trial consistencies given -- solid food trials were broken small and moistened well to lessen exertion of task(mastication) and for conservation of energy.  OM exam revealed no unilateral weakness. Pt fed self w/ setup and support- min shaky UEs noted.   Recommend a more Mech Soft diet for the cut/moistened foods to reduce oral phase effort (conservation of energy); Thin liquids VIA CUP in single, small sips. Pt should help feed self but tray setup and support should be offered. Must sit fully upright w/ oral intake. Pills given Whole vs Crushed in Puree for ease, safety of swallowing. Aspiration precautions to include frequent REST BREAKS during oral intake w/ close monitoring of RR or increased mouth breathing. GERD/REFLUX precautions.  A Palliative Care consult is recommended for support/GOC; Dietician f/u for support. Education given on Pills in Puree; food consistencies and easy to eat options; aspiration precautions to pt. ST services will continue to monitor for needs/education as pt returns to more foods in her diet- MD has ordered a Full liquid diet currently.  NSG updated, agreed. MD updated.  SLP Visit Diagnosis: Dysphagia, unspecified (R13.10) (in setting of Declined Pulmonary status w/ increased WOB w/ ANY exertion/tasks; deconditioning; poor appetite)    Aspiration Risk  Mild aspiration risk;Risk for inadequate nutrition/hydration (reduced following precautions)    Diet Recommendation   Thin;Dysphagia 3 (mechanical soft) (w/ gravies added to moisten well - MD has ordered a Full Liquid diet currently) = a more Mech Soft diet for the cut/moistened foods to reduce oral phase effort (conservation of  energy); Thin liquids VIA CUP in single, small sips. Pt should help feed self but tray setup and support should be offered. Must sit fully upright w/ oral intake. Aspiration precautions to include frequent REST BREAKS during oral intake w/ close monitoring of RR or increased mouth breathing. GERD/REFLUX precautions.   Medication Administration: Whole meds with puree (vs need to Crush)    Other  Recommendations Recommended Consults:  (Dietician support; Palliative Care support) Oral Care Recommendations: Oral care BID;Oral care before and after PO;Patient independent with oral care (setup)     Assistance Recommended at Discharge  Intermittent  Functional Status Assessment Patient has had a recent decline in their functional status and demonstrates the ability to make significant improvements in function in a reasonable and predictable amount of time. (w/ improved Pulmonary status)  Frequency and Duration min 1 x/week  1 week       Prognosis Prognosis for improved oropharyngeal function: Fair Barriers to Reach Goals: Time post onset;Severity of deficits Barriers/Prognosis Comment: Declined Pulmonary status w/ increased WOB w/ ANY exertion/tasks; deconditioning; poor appetite      Swallow Study   General Date of Onset: 07/14/24 HPI: Pt is a 73 y.o. 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  At baseline, she lives independently and independently complete ADLs.     She presented from home to the ED on 07/14/2024 with shortness of breath which has been gradually worsening for weeks but significantly worsened yesterday.  Patient states that she has had mild exertional dyspnea gradually worsening over the past few weeks.  Starting yesterday, she was unable to  speak in full sentences or take deep breaths.  She has had excessive cough without any exudate.  Patient states that she has had mild exertional dyspnea gradually worsening over the past few weeks but that she has been able to eat her meals w/out difficulty.  Patient was noted to be 84% on room air at admit.  She denies typically being on any oxygen.   She does report some decreased p.o. intake just secondary to not being hungry or thirsty.  Post admit, left-sided thoracentesis performed.  Pt remains on [place] O2 support.   CT Imaging of Chest: Moderate bilateral pleural effusions, left greater  than right. There is associated partial compressive atelectasis of  the lower lobes. There is background of Emphysema. Type of Study: Bedside Swallow Evaluation Previous Swallow Assessment: none Diet Prior to this Study: Regular;Thin liquids (Level 0) Temperature Spikes Noted: No (wbc 13.9 trending down) Respiratory Status: Nasal cannula (7L) History of Recent Intubation: No Behavior/Cognition: Alert;Cooperative;Pleasant mood Oral Cavity Assessment: Within Functional Limits Oral Care Completed by SLP: Yes Oral Cavity - Dentition: Missing dentition (few lower Dentition; Not wearing Upper Denture plate) Vision: Functional for self-feeding Self-Feeding Abilities: Able to feed self;Needs assist;Needs set up (shaky UEs- min) Patient Positioning: Upright in bed (supported positioning) Baseline Vocal Quality: Normal;Low vocal intensity (min) Volitional Cough: Strong Volitional Swallow: Able to elicit    Oral/Motor/Sensory Function Overall Oral Motor/Sensory Function: Within functional limits   Ice Chips Ice chips: Within functional limits Presentation: Spoon (fed; 2 trials)   Thin Liquid Thin Liquid: Within functional limits Presentation: Cup;Self Fed (9 trials) Other Comments: declined further    Nectar Thick Nectar Thick Liquid: Not tested   Honey Thick Honey Thick Liquid: Not tested   Puree Puree: Within  functional limits Presentation: Spoon;Self Fed (supported; 4 trials accepted)   Solid     Solid: Impaired Presentation: Spoon;Self Fed (supportedl 3 trials accepted) Oral Phase Impairments:  (wfl) Pharyngeal Phase Impairments:  (increased respiratory effort during oral phase time) Other Comments: rest breaks b/t trials        Comer Portugal, MS, CCC-SLP Speech Language Pathologist Rehab Services; [facility] [place] [phone] (ascom) Deeana Atwater 07/15/2024,3:39 PM

## 2024-07-15 NOTE — Progress Notes (Signed)
*  PRELIMINARY RESULTS* Echocardiogram 2D Echocardiogram has been performed.  Sara [name] 07/15/2024, 8:47 AM

## 2024-07-16 DIAGNOSIS — E440 Moderate protein-calorie malnutrition: Secondary | ICD-10-CM | POA: Insufficient documentation

## 2024-07-16 LAB — CBC
HCT: 45.1 % (ref 36.0–46.0)
Hemoglobin: 15.3 g/dL — ABNORMAL HIGH (ref 12.0–15.0)
MCH: 31.7 pg (ref 26.0–34.0)
MCHC: 33.9 g/dL (ref 30.0–36.0)
MCV: 93.6 fL (ref 80.0–100.0)
Platelets: 229 K/uL (ref 150–400)
RBC: 4.82 MIL/uL (ref 3.87–5.11)
RDW: 13.2 % (ref 11.5–15.5)
WBC: 11.7 K/uL — ABNORMAL HIGH (ref 4.0–10.5)
nRBC: 0 % (ref 0.0–0.2)

## 2024-07-16 LAB — BASIC METABOLIC PANEL WITH GFR
Anion gap: 14 (ref 5–15)
BUN: 33 mg/dL — ABNORMAL HIGH (ref 8–23)
CO2: 23 mmol/L (ref 22–32)
Calcium: 8.5 mg/dL — ABNORMAL LOW (ref 8.9–10.3)
Chloride: 94 mmol/L — ABNORMAL LOW (ref 98–111)
Creatinine, Ser: 0.87 mg/dL (ref 0.44–1.00)
GFR, Estimated: >60 mL/min (ref >60)
Glucose, Bld: 120 mg/dL — ABNORMAL HIGH (ref 70–99)
Potassium: 3.6 mmol/L (ref 3.5–5.1)
Sodium: 130 mmol/L — ABNORMAL LOW (ref 135–145)

## 2024-07-16 MED ORDER — FLUTICASONE FUROATE-VILANTEROL 100-25 MCG/ACT IN AEPB
1.0000 | INHALATION_SPRAY | Freq: Every day | RESPIRATORY_TRACT | Status: DC
  Administered 2024-07-17 – 2024-07-25 (×9): 1 via RESPIRATORY_TRACT
  Filled 2024-07-16: qty 28

## 2024-07-16 MED ORDER — MELATONIN 5 MG PO TABS
5.0000 mg | ORAL_TABLET | Freq: Every evening | ORAL | Status: DC | PRN
  Administered 2024-07-16 – 2024-07-24 (×3): 5 mg via ORAL
  Filled 2024-07-16 (×3): qty 1

## 2024-07-16 MED ORDER — IPRATROPIUM-ALBUTEROL 0.5-2.5 (3) MG/3ML IN SOLN
3.0000 mL | RESPIRATORY_TRACT | Status: DC
  Administered 2024-07-16 – 2024-07-17 (×3): 3 mL via RESPIRATORY_TRACT
  Filled 2024-07-16 (×3): qty 3

## 2024-07-16 NOTE — Consult Note (Signed)
 Pulmonary Critical Care  Initial Consult Note  Sara [name] FMW:[id number] DOB: November 08, 1950 DOA: 07/14/2024  Referring physician: Dr Maree  Chief Complaint: SOB  HPI: Sara [name] is a 73 y.o. female admitted for shortness of breath.  Patient apparently has a past medical history significant for GERD status post AKA peripheral vascular disease carotid stenosis hyperlipidemia coronary artery disease hypertension chronic pain syndrome came into the hospital with shortness of breath.  Patient has been getting worse over the course of the last several weeks.  She states she has also been having some pain in her chest.  She denies having hemoptysis.  Patient states she has a cough which is dry.  She has not smoked for over 35 years.  On initial evaluation she was noted to be having saturations in the 80s to 90s range.  She was admitted on oxygen chest x-ray was found to show a pleural effusion.  Review of Systems:  Constitutional:  No weight loss, night sweats, Fevers, chills, fatigue.  HEENT:  No headaches, nasal congestion, post nasal drip,  Cardio-vascular:  +chest pain, Orthopnea, PND, swelling in lower extremities, anasarca, dizziness, palpitations  GI:  No heartburn, indigestion, abdominal pain, nausea, vomiting, diarrhea  Resp:  +shortness of breath with exertion or at rest. no productive cough, No coughing up of blood.No wheezing Skin:  no rash or lesions.  Musculoskeletal:  No joint pain or swelling.   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 Jama   Vertigo    Last episode approx Aug 2015   Vertigo, benign paroxysmal 12/28/2015   Wears dentures    full upper   Past Surgical History:  Procedure Laterality Date   ADENOIDECTOMY     AMPUTATION Right 08/11/2020   Procedure: AMPUTATION ABOVE KNEE;  Surgeon: Jama Cordella MATSU, MD;  Location: [facility];  Service: Vascular;  Laterality: Right;   ARTERY BIOPSY Right 07/14/2019   Procedure: BIOPSY TEMPORAL ARTERY;  Surgeon: Jama Cordella MATSU, MD;  Location: [facility];  Service: Vascular;  Laterality: Right;   BREAST CYST ASPIRATION Left    CARDIAC CATHETERIZATION  02/15/2017   [facility]   COLONOSCOPY WITH PROPOFOL  N/A 10/22/2017   Procedure: COLONOSCOPY WITH PROPOFOL ;  Surgeon: Jinny Carmine, MD;  Location: [facility];  Service: Endoscopy;  Laterality: N/A;  specimens not taken--pt on Plavix  will be brought back in after 7 days off med   COLONOSCOPY WITH PROPOFOL  N/A 11/05/2017   Procedure: COLONOSCOPY WITH PROPOFOL ;  Surgeon: Jinny Carmine, MD;  Location: [facility];  Service: Endoscopy;  Laterality: N/A;   COLONOSCOPY WITH PROPOFOL  N/A 11/13/2022   Procedure: COLONOSCOPY WITH PROPOFOL ;  Surgeon: Jinny Carmine, MD;  Location: [facility];  Service: Endoscopy;  Laterality: N/A;   ESOPHAGOGASTRODUODENOSCOPY N/A 12/21/2014   Procedure: ESOPHAGOGASTRODUODENOSCOPY (EGD);  Surgeon: Rogelia Copping, MD;  Location: [facility];  Service: Gastroenterology;  Laterality: N/A;   ESOPHAGOGASTRODUODENOSCOPY (EGD) WITH PROPOFOL  N/A 02/08/2016   Procedure: ESOPHAGOGASTRODUODENOSCOPY (EGD) WITH PROPOFOL ;  Surgeon: Rogelia Copping, MD;  Location: [facility];  Service: Endoscopy;  Laterality: N/A;   FASCIOTOMY Right  05/30/2019   Procedure: FASCIOTOMY;  Surgeon: Jama Cordella MATSU, MD;  Location: [facility];  Service: Vascular;  Laterality: Right;   FASCIOTOMY CLOSURE Right 06/04/2019   Procedure: FASCIOTOMY CLOSURE;  Surgeon: Jama Cordella MATSU, MD;  Location: [facility];  Service: Vascular;  Laterality: Right;   HEMORROIDECTOMY  2014   LOWER EXTREMITY ANGIOGRAPHY Right 05/30/2019   Procedure: LOWER EXTREMITY ANGIOGRAPHY;  Surgeon: Jama Cordella MATSU, MD;  Location: [facility];  Service: Cardiovascular;  Laterality: Right;   LOWER EXTREMITY ANGIOGRAPHY Right 07/02/2020   Procedure: LOWER EXTREMITY ANGIOGRAPHY;  Surgeon: Jama Cordella MATSU, MD;  Location: [facility];  Service: Cardiovascular;  Laterality: Right;   PERIPHERAL VASCULAR CATHETERIZATION N/A 02/09/2016   Procedure: Abdominal Aortogram w/Lower Extremity;  Surgeon: Cordella MATSU Jama, MD;  Location: [facility];  Service: Cardiovascular;  Laterality: N/A;   PERIPHERAL VASCULAR CATHETERIZATION Right 02/10/2016   Procedure: Lower Extremity Angiography;  Surgeon: Cordella MATSU Jama, MD;  Location: [facility];  Service: Cardiovascular;  Laterality: Right;   POLYPECTOMY  11/05/2017   Procedure: POLYPECTOMY;  Surgeon: Copping Rogelia, MD;  Location: [facility];  Service: Endoscopy;;   POLYPECTOMY  11/13/2022   Procedure: POLYPECTOMY;  Surgeon: Copping Rogelia, MD;  Location: [facility];  Service: Endoscopy;;   Sarcoidos     bone   SHOULDER ARTHROSCOPY WITH ROTATOR CUFF REPAIR AND SUBACROMIAL DECOMPRESSION Right 02/27/2020   Procedure: RIGHT SHOULDER ARTHROSCOPY SUBACROMIAL DECOMPRESSION, DISTAL CLAVICLE EXCISION AND MINI-OPEN ROTATOR CUFF REPAIR;  Surgeon: Marchia Drivers, MD;  Location: [facility];  Service: Orthopedics;  Laterality: Right;   THORACIC LAMINECTOMY FOR SPINAL CORD STIMULATOR N/A 08/28/2023   Procedure: THORACIC LAMINECTOMY FOR SPINAL CORD STIMULATOR PLACEMENT (MEDTRONIC);  Surgeon: Claudene Penne ORN, MD;  Location: [facility];  Service: Neurosurgery;  Laterality: N/A;   TONSILLECTOMY     VASCULAR SURGERY  [id number]   Fem-Pop Bypass   Social History:  reports that she quit smoking about 32 years ago. Her smoking use included cigarettes. She started smoking about 57 years ago. She has a 50 pack-year smoking history. She has never used smokeless tobacco. She reports that she does not drink alcohol and does not use drugs.  Allergies  Allergen Reactions   Zolpidem  Anaphylaxis and Other (See Comments)    Family History  Problem Relation Age of Onset   CVA Mother    Heart attack Mother    Cancer Father        colon cancer   Colon cancer Father    Heart disease Maternal Uncle    Breast cancer Neg Hx     Prior to Admission medications   Medication Sig Start Date End Date Taking? Authorizing Provider  apixaban  (ELIQUIS ) 2.5 MG TABS tablet Take 2.5 mg by mouth 2 (two) times daily.   Yes [provider]  atorvastatin  (LIPITOR) 40 MG tablet Take 40 mg by mouth at bedtime.  12/08/16  Yes [provider]  digoxin  (LANOXIN ) 0.125 MG tablet Take 0.125 mg by mouth daily. 06/06/22  Yes [provider]  DULoxetine  (CYMBALTA ) 60 MG capsule  TAKE 1 CAPSULE BY MOUTH TWICE A DAY 11/13/22  Yes Boswell, Chelsa, NP  gabapentin  (NEURONTIN ) 400 MG tablet Take 400 mg by mouth 3 (three) times daily.   Yes [provider]  ivabradine  (CORLANOR ) 7.5 MG TABS tablet Take 1 tablet (7.5 mg total) by mouth 2 (two) times daily with a meal. 06/11/23  Yes Fernand Denyse LABOR, MD  losartan  (COZAAR ) 25 MG tablet Take 25 mg by mouth every morning.  12/01/16  Yes [provider]  meloxicam  (MOBIC ) 15 MG tablet Take 1 tablet (15 mg total) by mouth daily. 06/03/24  Yes Scoggins, Amber, NP  triamcinolone  ointment (KENALOG ) 0.5 % Apply 1 Application topically 2 (two) times daily. 03/29/23  Yes Scoggins, Amber, NP  Blood Pressure Monitoring (BLOOD PRESSURE CUFF) MISC 1 Units by Does  not apply route daily as needed. 04/02/23   Orlean Alan HERO, FNP  Calcium  Carb-Cholecalciferol  600-10 MG-MCG TABS Take 1 tablet by mouth 2 (two) times daily.    [provider]  cariprazine  (VRAYLAR ) 1.5 MG capsule Take 1 capsule (1.5 mg total) by mouth daily. Patient not taking: Reported on 07/14/2024 02/15/24   Carin Gauze, NP   Physical Exam: Vitals:   07/16/24 0429 07/16/24 0825 07/16/24 1253 07/16/24 1556  BP: 108/64 111/65 (!) 96/58 (!) 94/58  Pulse: 71 64 80 81  Resp: 18  16   Temp: 98.1 F (36.7 C) 98.4 F (36.9 C) 98 F (36.7 C) 98 F (36.7 C)  TempSrc:    Oral  SpO2: 93% 92% 93% 95%  Weight:       Body mass index is 23.28 kg/m.   Wt Readings from Last 3 Encounters:  07/14/24 59.6 kg  06/16/24 62.2 kg  06/03/24 62.7 kg    General:  Appears calm and comfortable Eyes: PERRL, normal lids, irises & conjunctiva ENT: grossly normal hearing, lips & tongue Neck: no LAD, masses or thyromegaly Cardiovascular: RRR, no m/r/g. No LE edema. Respiratory: CTA bilaterally, no w/r/r.       Normal respiratory effort. Abdomen: soft, nontender Skin: no rash or induration seen on limited exam           Labs on Admission:  Basic Metabolic Panel: Recent Labs  Lab 07/14/24 0515 07/15/24 0341 07/16/24 0341  NA 130* 128* 130*  K 4.0 3.6 3.6  CL 97* 93* 94*  CO2 21* 19* 23  GLUCOSE 140* 127* 120*  BUN 17 24* 33*  CREATININE 0.72 0.90 0.87  CALCIUM  8.9 8.5* 8.5*   Liver Function Tests: Recent Labs  Lab 07/14/24 0616  AST 28  ALT 9  ALKPHOS 70  BILITOT 1.4*  PROT 7.5  ALBUMIN 3.2*   No results for input(s): LIPASE, AMYLASE in the last 168 hours. No results for input(s): AMMONIA in the last 168 hours. CBC: Recent Labs  Lab 07/14/24 0426 07/15/24 0341 07/16/24 0341  WBC 16.5* 13.9* 11.7*  HGB 15.8* 16.0* 15.3*  HCT 46.9* 49.9* 45.1  MCV 95.9 101.2* 93.6  PLT 228 240 229   Cardiac Enzymes: No results for input(s): CKTOTAL, CKMB,  CKMBINDEX, TROPONINI in the last 168 hours.  BNP (last 3 results) No results for input(s): BNP in the last 8760 hours.  ProBNP (last 3 results) Recent Labs    07/14/24 0616  PROBNP 678.0*    CBG: No results for input(s): GLUCAP in the last 168 hours.  Radiological Exams on Admission: ECHOCARDIOGRAM COMPLETE Result Date: 07/15/2024    ECHOCARDIOGRAM REPORT   Patient Name:   Sara [name] Date of Exam: 07/15/2024 Medical Rec #:  [id number]            Height:       63.0 in Accession #:    [id number]           Weight:       131.4 lb Date of Birth:  1950/11/22             BSA:          1.617 m Patient Age:    73 years             BP:           124/75 mmHg Patient Gender: F                    HR:           76 bpm. Exam Location:  [facility] Procedure: Cardiac Doppler and Color Doppler (Both Spectral and Color Flow            Doppler were utilized during procedure). 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 Signs/Symptoms:Dyspnea; Risk                  Factors:Hypertension.  Sonographer:     Christopher Furnace Referring Phys:  [id number] MARIEN LITTIE PIETY Diagnosing Phys: Annalee Custovic  Sonographer Comments: Technically challenging study due to limited acoustic windows, no apical window and no subcostal window. IMPRESSIONS  1. Left ventricular ejection fraction, by estimation, is 60 to 65%. The left ventricle has normal function. The left ventricle has no regional wall motion abnormalities. Left ventricular diastolic parameters were normal.  2. Right ventricular systolic function is normal. The right ventricular size is normal.  3. The mitral valve is normal in structure. Trivial mitral valve regurgitation. No evidence of mitral stenosis.  4. The aortic valve is normal in structure. Aortic valve regurgitation is not visualized. No aortic stenosis is present.  5. The inferior vena cava is  normal in size with greater than 50% respiratory variability, suggesting right atrial pressure of 3 mmHg. FINDINGS  Left Ventricle: Left ventricular ejection fraction, by estimation, is 60 to 65%. The left ventricle has normal function. The left ventricle has no regional wall motion abnormalities. The left ventricular internal cavity size was normal in size. There is  no left ventricular hypertrophy. Left ventricular diastolic parameters were normal. Right Ventricle: The right ventricular size is normal. No increase in right ventricular wall thickness. Right ventricular systolic function is normal. Left Atrium: Left atrial size was normal in size. Right Atrium: Right atrial size was normal in size. Pericardium: There is no evidence of pericardial effusion. Mitral Valve: The mitral valve is normal in structure. Trivial mitral valve regurgitation. No evidence of mitral valve stenosis. Tricuspid Valve: The tricuspid valve is normal in structure. Tricuspid valve regurgitation is trivial. Aortic Valve: The aortic valve is normal in structure. Aortic valve regurgitation is not visualized. No aortic stenosis is present. Pulmonic Valve: The pulmonic valve was normal in structure. Pulmonic valve regurgitation is trivial. Aorta: The aortic root is normal in size and structure. Venous: The inferior vena cava is normal in size with greater than 50% respiratory variability, suggesting right atrial pressure of 3 mmHg. IAS/Shunts: No atrial level shunt detected by color  flow Doppler.  LEFT VENTRICLE PLAX 2D LVIDd:         3.70 cm LVIDs:         2.50 cm LV PW:         0.80 cm LV IVS:        1.00 cm LVOT diam:     2.00 cm LVOT Area:     3.14 cm  RIGHT VENTRICLE RVOT diam:      3.10 cm  AORTA Ao Root diam: 3.70 cm  SHUNTS Systemic Diam: 2.00 cm Pulmonic Diam: 3.10 cm Annalee Custovic Electronically signed by Annalee Casa Signature Date/Time: 07/15/2024/2:02:49 PM    Final     EKG: Independently  reviewed.  Assessment/Plan Principal Problem:   Pleural effusion Active Problems:   Malnutrition of moderate degree   Acute respiratory failure with shortness of breath.  Patient underwent a CT scan of the chest which I personally reviewed.  Patient has bilateral pleural effusions and this may signify fluid overload.  Also could be postpneumonic.  Recommended thoracentesis for diagnostic purposes.  In addition optimize cardiac status. Also noted on the CT scan is emphysematous changes.  Can go ahead and start her on inhalers. Pleural effusion thoracentesis ordered follow-up on results  Code Status: DNR limited  Family Communication: No family present Disposition Plan: Home  Time spent: 73  I have personally obtained a history, examined the patient, evaluated laboratory and imaging results, formulated the assessment and plan and placed orders.  The Patient requires high complexity decision making for assessment and support. Total Time Spent   Sara DELENA Bathe, MD [facility] Pulmonary Critical Care Medicine Sleep Medicine

## 2024-07-16 NOTE — Progress Notes (Signed)
 Initial Nutrition Assessment  DOCUMENTATION CODES:   Non-severe (moderate) malnutrition in context of chronic illness  INTERVENTION:   -Continue dysphagia 3 diet -MVI with minerals daily -Ensure Plus High Protein po TID, each supplement provides 350 kcal and 20 grams of protein  -Magic cup TID with meals, each supplement provides 290 kcal and 9 grams of protein  -Discussed plan of care and recommendations with RN, SLP, MD, and palliative care; palliative care consult pending for goals of care discussions    NUTRITION DIAGNOSIS:   Moderate Malnutrition related to chronic illness (sarcoidosis) as evidenced by mild fat depletion, moderate fat depletion, mild muscle depletion, moderate muscle depletion.  GOAL:   Patient will meet greater than or equal to 90% of their needs  MONITOR:   PO intake, Supplement acceptance, Diet advancement  REASON FOR ASSESSMENT:   Consult Assessment of nutrition requirement/status  ASSESSMENT:   73 y.o. 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  Patient admitted with bilateral pleural effusions.   11/24- s/p left thoracentesis (0.4 L removed)  11/25- s/p BSE- dysphagia 3 diet   Reviewed I/O's: +460 ml x 24 hours and +60 ml x 24 hours  UOP: 500 ml x 24 hours  Per H&P, patient has been experiencing shortness of breath over the past week PTA, which significantly got worse one day PTA (07/13/24).   Pulmonary consult pending.   Case discussed with RN, MD, SLP, and Palliative Care. Per SLP, patient with concerns about ability to meet nutritional needs secondary to need to take constant rest breakfast due to pulmonary status/ sarcoidosis. Per RN, patient consumed only half of her Magic Cup and half of Ensure yesterday afternoon. Documented meal completions 0-100%.   Spoke with  patient at bedside, who was pleasant and in good spirits today. She reports feeling better today. Patient reports that she has had a decreased appetite over the past week PTA, due to feeling unwell. She endorses eating less- consuming 2 meals per day (sandwiches and mashed potatoes) over the past week. Prior to this, she states that she was in her usually state of health, eating well (3 meals per day, but eating the same type of foods from last week's diet recall).   Patient states that she is appreciative of diet upgrade secondary to increased food choices. She is tolerating this consistency well and states she ate 100% of her spaghetti for dinner last night. Patient's breakfast just arrived and is eager to eat.   Patient reports her UBW is around 130#. She suspects she has lost weight, but unsure how much. She is also unsure when she last weighed 130# (I don't really check my weight much). Reviewed weight history; patient has experienced a 5% weight loss over the past month, which is significant for time frame.   Discussed importance of good meal and supplement intake to promote healing. Reviewed plans of care and ordered supplements. RD discussed that supplements will help patient meet nutritional needs if she does not consume all of her meals. Patient agreeable to proceed with plan of care and expressed appreciation for visit.   RD discussed findings and recommendations with MD, SLP, RN, and palliative care. No need to consider artifical means of nutrition/ hydration at this time, but would be beneficial to firm up goals of care overall as well as her wishes regarding nutrition support.  Medications reviewed and include lasix  and miralax .   Labs reviewed: Na: 130.   NUTRITION - FOCUSED PHYSICAL EXAM:  Flowsheet Row Most Recent Value  Orbital Region No depletion  Upper Arm Region Moderate depletion  Thoracic and Lumbar Region No depletion  Buccal Region Mild depletion  Temple Region Mild  depletion  Clavicle Bone Region Mild depletion  Clavicle and Acromion Bone Region Mild depletion  Scapular Bone Region Mild depletion  Dorsal Hand Moderate depletion  Patellar Region Mild depletion  Anterior Thigh Region Mild depletion  Posterior Calf Region Mild depletion  Edema (RD Assessment) None  Hair Reviewed  Eyes Reviewed  Mouth Reviewed  Skin Reviewed  Nails Reviewed    Diet Order:   Diet Order             DIET DYS 3 Room service appropriate? Yes with Assist; Fluid consistency: Thin  Diet effective now                   EDUCATION NEEDS:   Education needs have been addressed  Skin:  Skin Assessment: Reviewed RN Assessment  Last BM:  07/12/24  Height:   Ht Readings from Last 1 Encounters:  06/16/24 5' 3 (1.6 m)    Weight:   Wt Readings from Last 1 Encounters:  07/14/24 59.6 kg    Ideal Body Weight:  48.1 kg (adjusted for right AKA)  BMI:  Body mass index is 23.28 kg/m.  Estimated Nutritional Needs:   Kcal:  1500-1700  Protein:  75-90 grams  Fluid:  1.5-1.7 L    Margery ORN, RD, LDN, CDCES Registered Dietitian III Certified Diabetes Care and Education Specialist If unable to reach this RD, please use RD Inpatient group chat on secure chat between hours of 8am-4 pm daily

## 2024-07-16 NOTE — Progress Notes (Signed)
 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  On 11/25 she underwent IR guided left thoracentesis.  Pulmonology was also consulted.  Subjective: Patient reports she starting to feel better, still dyspneic.   Objective: Vitals:   07/16/24 0100 07/16/24 0429 07/16/24 0825 07/16/24 1253  BP:  108/64 111/65 (!) 96/58  Pulse:  71 64 80  Resp:  18  16  Temp:  98.1 F (36.7 C) 98.4 F (36.9 C) 98 F (36.7 C)  TempSrc:      SpO2: 94% 93% 92% 93%  Weight:        Intake/Output Summary (Last 24 hours) at 07/16/2024 1510 Last data filed at 07/16/2024 0900 Gross per 24 hour  Intake 840 ml  Output 500 ml  Net 340 ml   Filed Weights   07/14/24 0411  Weight: 59.6 kg    Examination: General exam: Appears calm and comfortable, NAD  Respiratory system: Shallow respirations, dyspneic when speaking, 7 L [place] in place Cardiovascular system: S1 & S2 heard, RRR.  Gastrointestinal system: Abdomen is nondistended, soft and nontender.  Neuro: Alert and oriented. No focal neurological deficits. Extremities: Symmetric, expected ROM Skin: No rashes, lesions Psychiatry: Demonstrates appropriate judgement and insight. Mood & affect appropriate for situation.   Assessment & Plan:  Principal Problem:   Pleural effusion Active Problems:   Malnutrition of moderate degree    Hypoxic respiratory failure Bilateral pleural effusions - This is multifactorial due to sarcoidosis, and likely component of COPD - Currently on 7 L [place],  continue to wean as tolerated - Still dyspneic - Effusions left greater than right.  No PE - No infectious signs or symptoms. - Negative RVP - Continue with breathing treatments - Pulmonology consulted, appreciate recommendations - Status post diagnostic and therapeutic thoracentesis 11/25 - Continue with diuresis IV Lasix  40 twice daily - Echo with EF 60 to 65%, no regional wall motion abnormalities, LV diastolic parameters normal.  No significant valvular abnormalities  Sarcoidosis - Pulmonary consult to Dr. Fernand - Resume home meds - Patient reports prior to this she was mostly asymptomatic  History of low back pain Limb pain - Continue home dose duloxetine  and gabapentin  - Spinal cord stimulator in place  Mild hyponatremia - Suspect this is hypervolemic - Resolving some with diuretics - Continue to trend CMP  Sinus tachycardia, chronic - Resume home meds digoxin  every bradycardia  PAD CAD Hypertension - Resume losartan  as BP tolerates.  Currently requiring diuretics  Status post right AKA - Continue use of prosthetic leg  COPD - Does not carry official diagnosis but there is evidence of emphysema on CTA - Has 50-pack-year smoking, quit 30 years ago  High risk aspiration secondary to increased work of breathing - Speech therapy working with patient.  Continue with dysphagia diet  Body mass index is 23.28 kg/m.   DVT prophylaxis: Eliquis    Code Status: Limited: Do not attempt resuscitation (DNR) -DNR-LIMITED -Do Not Intubate/DNI  Disposition:  Inpatient pending resolution in breathin  Consultants:    Procedures:    Antimicrobials:  Anti-infectives (From admission, onward)  None       Data Reviewed: I have personally reviewed following labs and imaging studies CBC: Recent Labs  Lab 07/14/24 0426 07/15/24 0341 07/16/24 0341  WBC 16.5* 13.9* 11.7*  HGB 15.8* 16.0* 15.3*  HCT 46.9* 49.9* 45.1  MCV 95.9 101.2* 93.6  PLT 228 240 229   Basic  Metabolic Panel: Recent Labs  Lab 07/14/24 0515 07/15/24 0341 07/16/24 0341  NA 130* 128* 130*  K 4.0 3.6 3.6  CL 97* 93* 94*  CO2 21* 19* 23  GLUCOSE 140* 127* 120*  BUN 17 24* 33*  CREATININE 0.72 0.90 0.87  CALCIUM  8.9 8.5* 8.5*   GFR: Estimated Creatinine Clearance: 49.1 mL/min (by C-G formula based on SCr of 0.87 mg/dL). Liver Function Tests: Recent Labs  Lab 07/14/24 0616  AST 28  ALT 9  ALKPHOS 70  BILITOT 1.4*  PROT 7.5  ALBUMIN 3.2*   CBG: No results for input(s): GLUCAP in the last 168 hours.  Recent Results (from the past 240 hours)  Resp panel by RT-PCR (RSV, Flu A&B, Covid) Anterior Nasal Swab     Status: None   Collection Time: 07/14/24  5:02 AM   Specimen: Anterior Nasal Swab  Result Value Ref Range Status   SARS Coronavirus 2 by RT PCR NEGATIVE NEGATIVE Final    Comment: (NOTE) SARS-CoV-2 target nucleic acids are NOT DETECTED.  The SARS-CoV-2 RNA is generally detectable in upper respiratory specimens during the acute phase of infection. The lowest concentration of SARS-CoV-2 viral copies this assay can detect is 138 copies/mL. A negative result does not preclude SARS-Cov-2 infection and should not be used as the sole basis for treatment or other patient management decisions. A negative result may occur with  improper specimen collection/handling, submission of specimen other than nasopharyngeal swab, presence of viral mutation(s) within the areas targeted by this assay, and inadequate number of viral copies(<138 copies/mL). A negative result must be combined with clinical observations, patient history, and epidemiological information. The expected result is Negative.  Fact Sheet for Patients:  bloggercourse.com  Fact Sheet for Healthcare Providers:  seriousbroker.it  This test is no t yet approved or cleared by the [place]  FDA and  has been authorized for detection and/or diagnosis of  SARS-CoV-2 by FDA under an Emergency Use Authorization (EUA). This EUA will remain  in effect (meaning this test can be used) for the duration of the COVID-19 declaration under Section 564(b)(1) of the Act, 21 U.S.C.section 360bbb-3(b)(1), unless the authorization is terminated  or revoked sooner.       Influenza A by PCR NEGATIVE NEGATIVE Final   Influenza B by PCR NEGATIVE NEGATIVE Final    Comment: (NOTE) The Xpert Xpress SARS-CoV-2/FLU/RSV plus assay is intended as an aid in the diagnosis of influenza from Nasopharyngeal swab specimens and should not be used as a sole basis for treatment. Nasal washings and aspirates are unacceptable for Xpert Xpress SARS-CoV-2/FLU/RSV testing.  Fact Sheet for Patients: bloggercourse.com  Fact Sheet for Healthcare Providers: seriousbroker.it  This test is not yet approved or cleared by the [place]  FDA and has been authorized for detection and/or diagnosis of SARS-CoV-2 by FDA under an Emergency Use Authorization (EUA). This EUA will remain in effect (meaning this test can be used) for the duration of the COVID-19 declaration under Section 564(b)(1) of the Act, 21 U.S.C. section 360bbb-3(b)(1), unless the authorization is terminated or revoked.     Resp Syncytial Virus by PCR NEGATIVE NEGATIVE Final    Comment: (NOTE)  Fact Sheet for Patients: bloggercourse.com  Fact Sheet for Healthcare Providers: seriousbroker.it  This test is not yet approved or cleared by the [place]  FDA and has been authorized for detection and/or diagnosis of SARS-CoV-2 by FDA under an Emergency Use Authorization (EUA). This EUA will remain in effect (meaning this test can be used) for the duration of the COVID-19 declaration under Section 564(b)(1) of the Act, 21 U.S.C. section 360bbb-3(b)(1), unless the authorization is terminated  or revoked.  Performed at [facility], [address] [place], [place] [address]   Blood culture (routine x 2)     Status: None (Preliminary result)   Collection Time: 07/14/24  5:49 AM   Specimen: BLOOD  Result Value Ref Range Status   Specimen Description BLOOD BLOOD LEFT ARM  Final   Special Requests   Final    BOTTLES DRAWN AEROBIC AND ANAEROBIC Blood Culture results may not be optimal due to an inadequate volume of blood received in culture bottles   Culture   Final    NO GROWTH 2 DAYS Performed at [facility], [address], [place], [place] [address]    Report Status PENDING  Incomplete  Blood culture (routine x 2)     Status: None (Preliminary result)   Collection Time: 07/14/24  5:49 AM   Specimen: BLOOD  Result Value Ref Range Status   Specimen Description BLOOD BLOOD LEFT ARM  Final   Special Requests   Final    BOTTLES DRAWN AEROBIC AND ANAEROBIC Blood Culture results may not be optimal due to an inadequate volume of blood received in culture bottles   Culture   Final    NO GROWTH 2 DAYS Performed at [facility], [address] [place], [place] [address]    Report Status PENDING  Incomplete  Body fluid culture w Gram Stain     Status: None (Preliminary result)   Collection Time: 07/14/24  2:05 PM   Specimen: PATH Cytology Pleural fluid  Result Value Ref Range Status   Specimen Description   Final    PLEURAL Performed at [facility], [address], [place], [place] [address]    Special Requests   Final    NONE Performed at [facility], [address] [place], [place] [address]    Gram Stain   Final    FEW WBC PRESENT,BOTH PMN AND MONONUCLEAR NO ORGANISMS SEEN    Culture   Final    NO GROWTH 2 DAYS Performed at [facility], [address] [place], [place] [address]    Report Status PENDING  Incomplete     Radiology Studies: ECHOCARDIOGRAM COMPLETE Result Date: 07/15/2024     ECHOCARDIOGRAM REPORT   Patient Name:   NYREE APPLEGATE Date of Exam: 07/15/2024 Medical Rec #:  [id number]            Height:       63.0 in Accession #:    [id number]           Weight:       131.4 lb Date of Birth:  [birth year]             BSA:          1.617 m Patient Age:    73 years             BP:           124/75 mmHg Patient Gender: F  HR:           76 bpm. Exam Location:  [facility] Procedure: Cardiac Doppler and Color Doppler (Both Spectral and Color Flow            Doppler were utilized during procedure). 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 Signs/Symptoms:Dyspnea; Risk                  Factors:Hypertension.  Sonographer:     Christopher Furnace Referring Phys:  [id number] MARIEN LITTIE PIETY Diagnosing Phys: Annalee Custovic  Sonographer Comments: Technically challenging study due to limited acoustic windows, no apical window and no subcostal window. IMPRESSIONS  1. Left ventricular ejection fraction, by estimation, is 60 to 65%. The left ventricle has normal function. The left ventricle has no regional wall motion abnormalities. Left ventricular diastolic parameters were normal.  2. Right ventricular systolic function is normal. The right ventricular size is normal.  3. The mitral valve is normal in structure. Trivial mitral valve regurgitation. No evidence of mitral stenosis.  4. The aortic valve is normal in structure. Aortic valve regurgitation is not visualized. No aortic stenosis is present.  5. The inferior vena cava is normal in size with greater than 50% respiratory variability, suggesting right atrial pressure of 3 mmHg. FINDINGS  Left Ventricle: Left ventricular ejection fraction, by estimation, is 60 to 65%. The left ventricle has normal function. The left ventricle has no regional wall motion abnormalities. The left ventricular internal cavity size was normal in size.  There is  no left ventricular hypertrophy. Left ventricular diastolic parameters were normal. Right Ventricle: The right ventricular size is normal. No increase in right ventricular wall thickness. Right ventricular systolic function is normal. Left Atrium: Left atrial size was normal in size. Right Atrium: Right atrial size was normal in size. Pericardium: There is no evidence of pericardial effusion. Mitral Valve: The mitral valve is normal in structure. Trivial mitral valve regurgitation. No evidence of mitral valve stenosis. Tricuspid Valve: The tricuspid valve is normal in structure. Tricuspid valve regurgitation is trivial. Aortic Valve: The aortic valve is normal in structure. Aortic valve regurgitation is not visualized. No aortic stenosis is present. Pulmonic Valve: The pulmonic valve was normal in structure. Pulmonic valve regurgitation is trivial. Aorta: The aortic root is normal in size and structure. Venous: The inferior vena cava is normal in size with greater than 50% respiratory variability, suggesting right atrial pressure of 3 mmHg. IAS/Shunts: No atrial level shunt detected by color flow Doppler.  LEFT VENTRICLE PLAX 2D LVIDd:         3.70 cm LVIDs:         2.50 cm LV PW:         0.80 cm LV IVS:        1.00 cm LVOT diam:     2.00 cm LVOT Area:     3.14 cm  RIGHT VENTRICLE RVOT diam:      3.10 cm  AORTA Ao Root diam: 3.70 cm  SHUNTS Systemic Diam: 2.00 cm Pulmonic Diam: 3.10 cm Designer, Multimedia signed by Annalee Casa Signature Date/Time: 07/15/2024/2:02:49 PM    Final     Scheduled Meds:  apixaban   2.5 mg Oral BID   digoxin   0.125 mg Oral Daily   DULoxetine   60  mg Oral BID   feeding supplement  237 mL Oral TID BM   ivabradine   7.5 mg Oral BID WC   multivitamin with minerals  1 tablet Oral Daily   polyethylene glycol  17 g Oral Daily   Continuous Infusions:   LOS: 2 days  MDM: Patient is high risk for one or more organ failure.  They necessitate ongoing  hospitalization for continued IV therapies and subsequent lab monitoring. Total time spent interpreting labs and vitals, reviewing the medical record, coordinating care amongst consultants and care team members, directly assessing and discussing care with the patient and/or family: 55 min  Estrellita Lasky, DO [facility]  To contact the attending physician between 7A-7P please use Epic Chat. To contact the covering physician during after hours 7P-7A, please review Amion.  07/16/2024, 3:10 PM   *This document has been created with the assistance of dictation software. Please excuse typographical errors. *

## 2024-07-16 NOTE — Plan of Care (Signed)
                                                     Palliative Care Progress Note   Patient Name: Sara [name]       Date: 07/16/2024 DOB: 01/06/1951  Age: 73 y.o. MRN#: [id number] Attending Physician: Dezii, Alexandra, DO Primary Care Physician: [facility] Admit Date: 07/14/2024  Extensive chart review completed including labs, vital signs, imaging, progress notes, orders, and available advanced directive documents from current and previous encounters.   After reviewing the patient's chart, I attempted to speak with patient in person.  Patient is awake, alert, acknowledges my presence, and is able to make her wishes known.  She has family/visitors at bedside.  I introduced myself and PMT's role on patient's medical team. I described palliative medicine as medical care for people living with serious illness.  I highlighted that my work on her care team is to be guide on the side and support system as she navigates through this hospitalization.  Reviewed that the focus of palliative medicine is on the relief of symptoms from serious illness.  Patient asked if we had to meet.  I assured her that my presence is not required.  She asked if we could meet that perhaps another day or time though she believes she is discharging soon.  I shared that I can return to bedside at another time later today.  She requested that I return a different day.  Unable to complete GOC discussion today.   PMT will attempt to visit with patient again at bedside at a later date/time.  Thank you for allowing the Palliative Medicine Team to assist in the care of Sara [name].  Lamarr L. Arvid, DNP, FNP-BC Palliative Medicine Team  No charge
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
## 2024-07-16 NOTE — Plan of Care (Signed)

## 2024-07-16 NOTE — Care Management Important Message (Signed)
 Important Message  Patient Details  Name: Sara [name] MRN: [id number] Date of Birth: 1951/01/15   Important Message Given:  Yes - Medicare IM     Rojelio SHAUNNA Rattler 07/16/2024, 4:41 PM

## 2024-07-17 LAB — COMPREHENSIVE METABOLIC PANEL WITH GFR
ALT: 27 U/L (ref 0–44)
AST: 51 U/L — ABNORMAL HIGH (ref 15–41)
Albumin: 2.8 g/dL — ABNORMAL LOW (ref 3.5–5.0)
Alkaline Phosphatase: 78 U/L (ref 38–126)
Anion gap: 11 (ref 5–15)
BUN: 29 mg/dL — ABNORMAL HIGH (ref 8–23)
CO2: 28 mmol/L (ref 22–32)
Calcium: 8.9 mg/dL (ref 8.9–10.3)
Chloride: 95 mmol/L — ABNORMAL LOW (ref 98–111)
Creatinine, Ser: 0.74 mg/dL (ref 0.44–1.00)
GFR, Estimated: >60 mL/min (ref >60)
Glucose, Bld: 127 mg/dL — ABNORMAL HIGH (ref 70–99)
Potassium: 3.9 mmol/L (ref 3.5–5.1)
Sodium: 133 mmol/L — ABNORMAL LOW (ref 135–145)
Total Bilirubin: 0.5 mg/dL (ref 0.0–1.2)
Total Protein: 7.2 g/dL (ref 6.5–8.1)

## 2024-07-17 LAB — CBC WITH DIFFERENTIAL/PLATELET
Abs Immature Granulocytes: 0.18 K/uL — ABNORMAL HIGH (ref 0.00–0.07)
Basophils Absolute: 0.1 K/uL (ref 0.0–0.1)
Basophils Relative: 1 %
Eosinophils Absolute: 0.2 K/uL (ref 0.0–0.5)
Eosinophils Relative: 2 %
HCT: 45.4 % (ref 36.0–46.0)
Hemoglobin: 15.1 g/dL — ABNORMAL HIGH (ref 12.0–15.0)
Immature Granulocytes: 2 %
Lymphocytes Relative: 8 %
Lymphs Abs: 0.9 K/uL (ref 0.7–4.0)
MCH: 31.7 pg (ref 26.0–34.0)
MCHC: 33.3 g/dL (ref 30.0–36.0)
MCV: 95.4 fL (ref 80.0–100.0)
Monocytes Absolute: 0.9 K/uL (ref 0.1–1.0)
Monocytes Relative: 8 %
Neutro Abs: 8.8 K/uL — ABNORMAL HIGH (ref 1.7–7.7)
Neutrophils Relative %: 79 %
Platelets: 257 K/uL (ref 150–400)
RBC: 4.76 MIL/uL (ref 3.87–5.11)
RDW: 13.4 % (ref 11.5–15.5)
WBC: 11 K/uL — ABNORMAL HIGH (ref 4.0–10.5)
nRBC: 0 % (ref 0.0–0.2)

## 2024-07-17 MED ORDER — IPRATROPIUM-ALBUTEROL 0.5-2.5 (3) MG/3ML IN SOLN
3.0000 mL | Freq: Four times a day (QID) | RESPIRATORY_TRACT | Status: DC
  Administered 2024-07-17 – 2024-07-18 (×4): 3 mL via RESPIRATORY_TRACT
  Filled 2024-07-17 (×4): qty 3

## 2024-07-17 MED ORDER — METHYLPREDNISOLONE SODIUM SUCC 40 MG IJ SOLR
40.0000 mg | Freq: Once | INTRAMUSCULAR | Status: AC
  Administered 2024-07-17: 40 mg via INTRAVENOUS
  Filled 2024-07-17: qty 1

## 2024-07-17 NOTE — Progress Notes (Signed)
 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  On 11/25 she underwent IR guided left thoracentesis.  Pulmonology was also consulted.  Subjective: No acute events overnight. On evaluation this morning patient reports she is starting to feel a little improved   Objective: Vitals:   07/16/24 2317 07/17/24 0300 07/17/24 0349 07/17/24 0728  BP:   (!) 110/59   Pulse:   74   Resp:   (!) 22   Temp:   97.9 F (36.6 C)   TempSrc:      SpO2: 94% 93% 95% 95%  Weight:        Intake/Output Summary (Last 24 hours) at 07/17/2024 0737 Last data filed at 07/16/2024 1838 Gross per 24 hour  Intake 120 ml  Output --  Net 120 ml   Filed Weights   07/14/24 0411  Weight: 59.6 kg    Examination: General exam: Appears calm and comfortable, NAD  Respiratory system: Shallow respirations, dyspneic when speaking, 6L [place] in place  Cardiovascular system: S1 & S2 heard, RRR.  Gastrointestinal system: Abdomen is nondistended, soft and nontender.  Neuro: Alert and oriented. No focal neurological deficits. Extremities: Symmetric, expected ROM Skin: No rashes, lesions Psychiatry: Demonstrates appropriate judgement and insight. Mood & affect appropriate for situation.   Assessment & Plan:  Principal Problem:   Pleural effusion Active Problems:   Malnutrition of moderate degree    Hypoxic respiratory failure Bilateral pleural effusions - This is multifactorial due to sarcoidosis, and likely component of COPD - Currently on 7 L [place], but  maintaining sats above 95%, continue to wean down -Effusions on arrival left greater than right.  No PE on CTA - No infectious signs or symptoms - Negative RVP - Continue with breathing treatments - Added Breo Ellipta  11/26 - Pulmonology consulted, appreciate recommendations - Status post diagnostic and therapeutic thoracentesis 11/25.  Gram stain negative, culture negative.  cyto path pending - Continue with diuresis IV Lasix  40 twice daily - Echo with EF 60 to 65%, no regional wall motion abnormalities, LV diastolic parameters normal.  No significant valvular abnormalities  Sarcoidosis - Pulmonary consult to Dr. Fernand - On [place], as needed DuoNebs - Patient reports prior to this she was mostly asymptomatic  History of low back pain Limb pain - Continue home dose duloxetine  and gabapentin  - Spinal cord stimulator in place - PT/OT recommending SNF.  Patient has refused this recommendation.  She is amenable to home health which has been ordered.  Mild hyponatremia - Suspect this is hypervolemic - Resolving some with diuretics - Repeat CMP today  Sinus tachycardia, chronic - Resolved.  Continue home digoxin   PAD CAD Hypertension - Resume losartan  as BP tolerates.  Currently requiring diuretics  Status post right AKA - Continue use of prosthetic leg  COPD - Does not carry official diagnosis but there is evidence of emphysema on CTA - Has 50-pack-year smoking, quit 30 years ago - Pulmonary consult as above, will need outpatient follow-up for PFTs.  Has been started on Breo  High risk aspiration secondary to increased work of breathing -  Speech therapy working with patient.  Continue with dysphagia diet  Body mass index is 23.28 kg/m.   DVT prophylaxis: Eliquis    Code Status: Limited: Do not attempt resuscitation (DNR) -DNR-LIMITED -Do Not Intubate/DNI  Disposition:  Inpatient pending resolution in breathing, will go home with home health.  Consultants:     Procedures:    Antimicrobials:  Anti-infectives (From admission, onward)    None       Data Reviewed: I have personally reviewed following labs and imaging studies CBC: Recent Labs  Lab 07/14/24 0426 07/15/24 0341 07/16/24 0341  WBC 16.5* 13.9* 11.7*  HGB 15.8* 16.0* 15.3*  HCT 46.9* 49.9* 45.1  MCV 95.9 101.2* 93.6  PLT 228 240 229   Basic Metabolic Panel: Recent Labs  Lab 07/14/24 0515 07/15/24 0341 07/16/24 0341  NA 130* 128* 130*  K 4.0 3.6 3.6  CL 97* 93* 94*  CO2 21* 19* 23  GLUCOSE 140* 127* 120*  BUN 17 24* 33*  CREATININE 0.72 0.90 0.87  CALCIUM  8.9 8.5* 8.5*   GFR: Estimated Creatinine Clearance: 49.1 mL/min (by C-G formula based on SCr of 0.87 mg/dL). Liver Function Tests: Recent Labs  Lab 07/14/24 0616  AST 28  ALT 9  ALKPHOS 70  BILITOT 1.4*  PROT 7.5  ALBUMIN 3.2*   CBG: No results for input(s): GLUCAP in the last 168 hours.  Recent Results (from the past 240 hours)  Resp panel by RT-PCR (RSV, Flu A&B, Covid) Anterior Nasal Swab     Status: None   Collection Time: 07/14/24  5:02 AM   Specimen: Anterior Nasal Swab  Result Value Ref Range Status   SARS Coronavirus 2 by RT PCR NEGATIVE NEGATIVE Final    Comment: (NOTE) SARS-CoV-2 target nucleic acids are NOT DETECTED.  The SARS-CoV-2 RNA is generally detectable in upper respiratory specimens during the acute phase of infection. The lowest concentration of SARS-CoV-2 viral copies this assay can detect is 138 copies/mL. A negative result does not preclude SARS-Cov-2 infection and should not be used as the sole basis for treatment or other patient management decisions. A negative result may occur with  improper specimen collection/handling, submission of specimen other than nasopharyngeal swab, presence of viral mutation(s) within the areas targeted by this assay, and inadequate number of viral copies(<138 copies/mL). A negative result must be combined with clinical  observations, patient history, and epidemiological information. The expected result is Negative.  Fact Sheet for Patients:  bloggercourse.com  Fact Sheet for Healthcare Providers:  seriousbroker.it  This test is no t yet approved or cleared by the [place]  FDA and  has been authorized for detection and/or diagnosis of SARS-CoV-2 by FDA under an Emergency Use Authorization (EUA). This EUA will remain  in effect (meaning this test can be used) for the duration of the COVID-19 declaration under Section 564(b)(1) of the Act, 21 U.S.C.section 360bbb-3(b)(1), unless the authorization is terminated  or revoked sooner.       Influenza A by PCR NEGATIVE NEGATIVE Final   Influenza B by PCR NEGATIVE NEGATIVE Final    Comment: (NOTE) The Xpert Xpress SARS-CoV-2/FLU/RSV plus assay is intended as an aid in the diagnosis of influenza from Nasopharyngeal swab specimens and should not be used as a sole basis for treatment. Nasal washings and aspirates are unacceptable for Xpert Xpress SARS-CoV-2/FLU/RSV testing.  Fact Sheet for Patients: bloggercourse.com  Fact Sheet for Healthcare Providers: seriousbroker.it  This test is not yet approved or cleared by the [place]  FDA and has been  authorized for detection and/or diagnosis of SARS-CoV-2 by FDA under an Emergency Use Authorization (EUA). This EUA will remain in effect (meaning this test can be used) for the duration of the COVID-19 declaration under Section 564(b)(1) of the Act, 21 U.S.C. section 360bbb-3(b)(1), unless the authorization is terminated or revoked.     Resp Syncytial Virus by PCR NEGATIVE NEGATIVE Final    Comment: (NOTE) Fact Sheet for Patients: bloggercourse.com  Fact Sheet for Healthcare Providers: seriousbroker.it  This test is not yet approved or cleared by  the [place]  FDA and has been authorized for detection and/or diagnosis of SARS-CoV-2 by FDA under an Emergency Use Authorization (EUA). This EUA will remain in effect (meaning this test can be used) for the duration of the COVID-19 declaration under Section 564(b)(1) of the Act, 21 U.S.C. section 360bbb-3(b)(1), unless the authorization is terminated or revoked.  Performed at [facility], [address] [place], [place] [address]   Blood culture (routine x 2)     Status: None (Preliminary result)   Collection Time: 07/14/24  5:49 AM   Specimen: BLOOD  Result Value Ref Range Status   Specimen Description BLOOD BLOOD LEFT ARM  Final   Special Requests   Final    BOTTLES DRAWN AEROBIC AND ANAEROBIC Blood Culture results may not be optimal due to an inadequate volume of blood received in culture bottles   Culture   Final    NO GROWTH 2 DAYS Performed at [facility], [address] [place], [place] [address]    Report Status PENDING  Incomplete  Blood culture (routine x 2)     Status: None (Preliminary result)   Collection Time: 07/14/24  5:49 AM   Specimen: BLOOD  Result Value Ref Range Status   Specimen Description BLOOD BLOOD LEFT ARM  Final   Special Requests   Final    BOTTLES DRAWN AEROBIC AND ANAEROBIC Blood Culture results may not be optimal due to an inadequate volume of blood received in culture bottles   Culture   Final    NO GROWTH 2 DAYS Performed at [facility], [address] [place], [place] [address]    Report Status PENDING  Incomplete  Body fluid culture w Gram Stain     Status: None (Preliminary result)   Collection Time: 07/14/24  2:05 PM   Specimen: PATH Cytology Pleural fluid  Result Value Ref Range Status   Specimen Description   Final    PLEURAL Performed at [facility], [address], [place], [place] [address]    Special Requests   Final    NONE Performed at [facility], [address] [place], [place] [address]    Gram Stain   Final    FEW WBC PRESENT,BOTH PMN AND MONONUCLEAR NO ORGANISMS SEEN    Culture   Final    NO GROWTH 2 DAYS Performed at [facility], [address], [place], [place] [address]    Report Status PENDING  Incomplete     Radiology Studies: ECHOCARDIOGRAM COMPLETE Result Date: 07/15/2024    ECHOCARDIOGRAM REPORT   Patient Name:   LEITH HEDLUND Date of Exam: 07/15/2024 Medical Rec #:  [id number]            Height:       63.0 in Accession #:    [id number]           Weight:       131.4 lb Date of Birth:  1950-09-04             BSA:          1.617 m Patient Age:    73 years             BP:           124/75 mmHg Patient Gender: F                    HR:           76 bpm. Exam Location:  [facility] Procedure: Cardiac Doppler and Color Doppler (Both Spectral and Color Flow            Doppler were utilized during procedure). 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 Signs/Symptoms:Dyspnea; Risk                  Factors:Hypertension.  Sonographer:     Christopher Furnace Referring Phys:  [id number] MARIEN LITTIE PIETY Diagnosing Phys: Annalee Custovic  Sonographer Comments: Technically challenging study due to limited acoustic windows, no apical window and no subcostal window. IMPRESSIONS  1. Left ventricular ejection fraction, by estimation, is 60 to 65%. The left ventricle has normal function. The left ventricle has no regional wall motion abnormalities. Left ventricular diastolic parameters were normal.  2. Right ventricular systolic function is normal. The right ventricular size is normal.  3. The mitral valve is normal in structure. Trivial mitral valve regurgitation. No evidence of mitral stenosis.  4. The aortic valve is normal in structure. Aortic valve regurgitation is not visualized. No aortic stenosis is present.  5. The inferior vena cava is normal  in size with greater than 50% respiratory variability, suggesting right atrial pressure of 3 mmHg. FINDINGS  Left Ventricle: Left ventricular ejection fraction, by estimation, is 60 to 65%. The left ventricle has normal function. The left ventricle has no regional wall motion abnormalities. The left ventricular internal cavity size was normal in size. There is  no left ventricular hypertrophy. Left ventricular diastolic parameters were normal. Right Ventricle: The right ventricular size is normal. No increase in right ventricular wall thickness. Right ventricular systolic function is normal. Left Atrium: Left atrial size was normal in size. Right Atrium: Right atrial size was normal in size. Pericardium: There is no evidence of pericardial effusion. Mitral Valve: The mitral valve is normal in structure. Trivial mitral valve regurgitation. No evidence of mitral valve stenosis. Tricuspid Valve: The tricuspid valve is normal in structure. Tricuspid valve regurgitation is trivial. Aortic Valve: The aortic valve is normal in structure. Aortic valve regurgitation is not visualized. No aortic stenosis is present. Pulmonic Valve: The pulmonic valve was normal in structure. Pulmonic valve regurgitation is trivial. Aorta: The aortic root is normal in size and structure. Venous: The inferior vena cava is normal in size with greater than 50% respiratory variability, suggesting right atrial pressure of 3 mmHg. IAS/Shunts: No atrial level shunt detected by color flow Doppler.  LEFT VENTRICLE PLAX 2D LVIDd:         3.70 cm LVIDs:         2.50 cm LV PW:         0.80 cm LV IVS:        1.00 cm LVOT diam:     2.00  cm LVOT Area:     3.14 cm  RIGHT VENTRICLE RVOT diam:      3.10 cm  AORTA Ao Root diam: 3.70 cm  SHUNTS Systemic Diam: 2.00 cm Pulmonic Diam: 3.10 cm Designer, Multimedia signed by Annalee Casa Signature Date/Time: 07/15/2024/2:02:49 PM    Final     Scheduled Meds:  apixaban   2.5 mg Oral BID   digoxin    0.125 mg Oral Daily   DULoxetine   60 mg Oral BID   feeding supplement  237 mL Oral TID BM   fluticasone  furoate-vilanterol  1 puff Inhalation Daily   ipratropium-albuterol   3 mL Nebulization Q4H   ivabradine   7.5 mg Oral BID WC   multivitamin with minerals  1 tablet Oral Daily   polyethylene glycol  17 g Oral Daily   Continuous Infusions:   LOS: 3 days  MDM: Patient is high risk for one or more organ failure.  They necessitate ongoing hospitalization for continued IV therapies and subsequent lab monitoring. Total time spent interpreting labs and vitals, reviewing the medical record, coordinating care amongst consultants and care team members, directly assessing and discussing care with the patient and/or family: 55 min  Jamaurie Bernier, DO [facility]  To contact the attending physician between 7A-7P please use Epic Chat. To contact the covering physician during after hours 7P-7A, please review Amion.  07/17/2024, 7:37 AM   *This document has been created with the assistance of dictation software. Please excuse typographical errors. *

## 2024-07-17 NOTE — TOC Progression Note (Signed)
 Transition of Care [facility]) - Progression Note    Patient Details  Name: Sara [name] MRN: [id number] Date of Birth: 1950-11-10  [facility] CM/SW Contact  K'La JINNY Ruts, LCSW Phone Number: 07/17/2024, 12:11 PM  Clinical Narrative:    Chart reviewed. I was able to speak with the patient at bedside today. I introduced myself, my role, and reason for consult. I discussed the SNF recommendation with the patient. The patient declined SNF rec. The patient was accepting of HH. I provided the patient with a list of HH agencies. The patient consented to referral sent in the HUB.                     Expected Discharge Plan and Services                                               Social Drivers of Health (SDOH) Interventions SDOH Screenings   Food Insecurity: No Food Insecurity (07/14/2024)  Housing: Low Risk  (07/14/2024)  Transportation Needs: No Transportation Needs (07/14/2024)  Utilities: Not At Risk (07/14/2024)  Depression (PHQ2-9): Low Risk  (05/22/2023)  Financial Resource Strain: Patient Declined (07/16/2023)   Received from [facility]  Social Connections: Socially Isolated (07/14/2024)  Tobacco Use: Medium Risk (06/16/2024)    Readmission Risk Interventions     No data to display

## 2024-07-17 NOTE — Consult Note (Signed)
 Consultation Note Date: 07/17/2024   Patient Name: Sara [name]  DOB: July 21, 1951  MRN: [id number]  Age / Sex: 73 y.o., female  PCP: [facility] Referring Physician: Leesa Kast, DO  Reason for Consultation:  goals of care  HPI/Patient Profile: 73 y.o. female  with past medical history of sarcoidosis, PAD s/p R aka, HLD, CAD, PVD, HTN, CVA, IGA monoclonal gammopathy admitted on 07/14/2024 with shortness of breath. Workup revealed bilateral pleural effusions- multifactorial due to sarcoidosis and COPD. She has undergone thoracentesis and is feeling better.     Primary Decision Maker PATIENT  Discussion: Chart reviewed including labs, progress notes, imaging from this and previous encounters.  Attending note and pulmonology note reviewed. ECHO reviewed- L EF 60-65%.  Labs- hyponatremia 133, WBC trending down 11.0 today.  CT scan reviewed- bilateral pleural effusions, left greater than right.  On evaluation patient is awake and alert. Soft spoken. Reports she is tired.  Prior to admission she was living at home. Able to care for herself as long as she has her prosthesis and her wheelchair.  We discussed her current acute and chronic issues. She was unaware of what sarcoidosis exactly is - so we discussed the pathophysiology and possible trajectories. She has not had any exacerbations prior to this admission.  If she were unable to make her own healthcare decisions shew would want her brother to be her decision maker.  She would not want to be intubated or resuscitated. She hasn't discussed this with her brother- I encouraged her to share her wishes with him.  Her goals for now are to complete medical treatment and return home.       SUMMARY OF RECOMMENDATIONS -Pleural effusion/SOB r/t sarcoidosis/COPD- continue current interventions, hope to return home -DNR/DNI    Code  Status/Advance Care Planning:   Code Status: Limited: Do not attempt resuscitation (DNR) -DNR-LIMITED -Do Not Intubate/DNI     Prognosis:   Unable to determine  Discharge Planning: To Be Determined  Primary Diagnoses: Present on Admission:  Pleural effusion   Review of Systems  Physical Exam  Vital Signs: BP (!) 112/56 (BP Location: Left Arm)   Pulse 87   Temp 98 F (36.7 C)   Resp 17   Wt 59.6 kg   SpO2 95%   BMI 23.28 kg/m  Pain Scale: 0-10   Pain Score: 0-No pain   SpO2: SpO2: 95 % O2 Device:SpO2: 95 % O2 Flow Rate: .O2 Flow Rate (L/min): 2 L/min  IO: Intake/output summary:  Intake/Output Summary (Last 24 hours) at 07/17/2024 1300 Last data filed at 07/16/2024 1838 Gross per 24 hour  Intake 0 ml  Output --  Net 0 ml    LBM: Last BM Date : 07/12/24 Baseline Weight: Weight: 59.6 kg Most recent weight: Weight: 59.6 kg       Thank you for this consult. Palliative medicine will continue to follow and assist as needed.   Signed by: Cassondra Stain, AGNP-C Palliative Medicine  Time includes:   Preparing to see the patient (  e.g., review of tests) Obtaining and/or reviewing separately obtained history Performing a medically necessary appropriate examination and/or evaluation Counseling and educating the patient/family/caregiver Ordering medications, tests, or procedures Referring and communicating with other health care professionals (when not reported separately) Documenting clinical information in the electronic or other health record Independently interpreting results (not reported separately) and communicating results to the patient/family/caregiver Care coordination (not reported separately) Clinical documentation   Please contact Palliative Medicine Team phone at [phone] for questions and concerns.  For individual provider: See Tracey

## 2024-07-17 NOTE — Plan of Care (Signed)
  Problem: Clinical Measurements: Goal: Will remain free from infection Outcome: Progressing Goal: Respiratory complications will improve Outcome: Progressing Goal: Cardiovascular complication will be avoided Outcome: Progressing   Problem: Nutrition: Goal: Adequate nutrition will be maintained Outcome: Progressing   Problem: Safety: Goal: Ability to remain free from injury will improve Outcome: Progressing
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
## 2024-07-18 ENCOUNTER — Other Ambulatory Visit (HOSPITAL_COMMUNITY): Payer: Self-pay

## 2024-07-18 LAB — COMP PANEL: LEUKEMIA/LYMPHOMA

## 2024-07-18 LAB — CBC WITH DIFFERENTIAL/PLATELET
Abs Immature Granulocytes: 0.26 K/uL — ABNORMAL HIGH (ref 0.00–0.07)
Basophils Absolute: 0 K/uL (ref 0.0–0.1)
Basophils Relative: 0 %
Eosinophils Absolute: 0 K/uL (ref 0.0–0.5)
Eosinophils Relative: 0 %
HCT: 41.8 % (ref 36.0–46.0)
Hemoglobin: 14.2 g/dL (ref 12.0–15.0)
Immature Granulocytes: 2 %
Lymphocytes Relative: 5 %
Lymphs Abs: 0.6 K/uL — ABNORMAL LOW (ref 0.7–4.0)
MCH: 32.3 pg (ref 26.0–34.0)
MCHC: 34 g/dL (ref 30.0–36.0)
MCV: 95 fL (ref 80.0–100.0)
Monocytes Absolute: 0.5 K/uL (ref 0.1–1.0)
Monocytes Relative: 4 %
Neutro Abs: 11.3 K/uL — ABNORMAL HIGH (ref 1.7–7.7)
Neutrophils Relative %: 89 %
Platelets: 264 K/uL (ref 150–400)
RBC: 4.4 MIL/uL (ref 3.87–5.11)
RDW: 13.1 % (ref 11.5–15.5)
WBC: 12.8 K/uL — ABNORMAL HIGH (ref 4.0–10.5)
nRBC: 0 % (ref 0.0–0.2)

## 2024-07-18 LAB — COMPREHENSIVE METABOLIC PANEL WITH GFR
ALT: 40 U/L (ref 0–44)
AST: 53 U/L — ABNORMAL HIGH (ref 15–41)
Albumin: 2.9 g/dL — ABNORMAL LOW (ref 3.5–5.0)
Alkaline Phosphatase: 75 U/L (ref 38–126)
Anion gap: 10 (ref 5–15)
BUN: 29 mg/dL — ABNORMAL HIGH (ref 8–23)
CO2: 29 mmol/L (ref 22–32)
Calcium: 8.9 mg/dL (ref 8.9–10.3)
Chloride: 93 mmol/L — ABNORMAL LOW (ref 98–111)
Creatinine, Ser: 0.68 mg/dL (ref 0.44–1.00)
GFR, Estimated: >60 mL/min (ref >60)
Glucose, Bld: 166 mg/dL — ABNORMAL HIGH (ref 70–99)
Potassium: 4.3 mmol/L (ref 3.5–5.1)
Sodium: 132 mmol/L — ABNORMAL LOW (ref 135–145)
Total Bilirubin: 0.3 mg/dL (ref 0.0–1.2)
Total Protein: 6.9 g/dL (ref 6.5–8.1)

## 2024-07-18 LAB — BODY FLUID CULTURE W GRAM STAIN: Culture: NO GROWTH

## 2024-07-18 LAB — MISC LABCORP TEST (SEND OUT): Labcorp test code: 88062

## 2024-07-18 LAB — MAGNESIUM: Magnesium: 2.6 mg/dL — ABNORMAL HIGH (ref 1.7–2.4)

## 2024-07-18 LAB — PHOSPHORUS: Phosphorus: 4.1 mg/dL (ref 2.5–4.6)

## 2024-07-18 MED ORDER — PREDNISONE 50 MG PO TABS
50.0000 mg | ORAL_TABLET | Freq: Every day | ORAL | Status: AC
  Administered 2024-07-18 – 2024-07-21 (×4): 50 mg via ORAL
  Filled 2024-07-18 (×4): qty 1

## 2024-07-18 MED ORDER — IPRATROPIUM-ALBUTEROL 0.5-2.5 (3) MG/3ML IN SOLN
3.0000 mL | Freq: Two times a day (BID) | RESPIRATORY_TRACT | Status: DC
  Administered 2024-07-18 – 2024-07-20 (×4): 3 mL via RESPIRATORY_TRACT
  Filled 2024-07-18 (×4): qty 3
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
## 2024-07-18 NOTE — Plan of Care (Signed)

## 2024-07-18 NOTE — Progress Notes (Signed)
 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  On 11/25 she underwent IR guided left thoracentesis.  Pulmonology was also consulted.  Subjective: Patient endorsed increased stiffening in her neck and arms, she believes she slept funny but I suspect this was secondary to sarcoidosis flare.  She was given 40 mg Methylpred yesterday reports significant improvement.  Will continue with this for now.  She reports feeling much better today.  She is also weaned down to 2 L   Objective: Vitals:   07/18/24 0108 07/18/24 0113 07/18/24 0431 07/18/24 0805  BP:   (!) 114/54   Pulse:   64 65  Resp:   18   Temp:   (!) 97.4 F (36.3 C)   TempSrc:      SpO2: (!) 86% 92% 94%   Weight:        Intake/Output Summary (Last 24 hours) at 07/18/2024 0840 Last data filed at 07/17/2024 1750 Gross per 24 hour  Intake 50 ml  Output 200 ml  Net -150 ml   Filed Weights   07/14/24 0411  Weight: 59.6 kg    Examination: General exam: Appears calm and comfortable, NAD  Respiratory system: Shallow respirations, dyspneic when speaking, 2 L  in place Cardiovascular system: S1 & S2 heard, RRR.  Gastrointestinal system: Abdomen is nondistended, soft and nontender.  Neuro: Alert and oriented. No focal neurological deficits. Extremities: Symmetric, expected ROM Skin: No rashes, lesions Psychiatry: Demonstrates appropriate judgement and insight. Mood & affect appropriate for situation.   Assessment & Plan:  Principal Problem:   Pleural  effusion Active Problems:   Malnutrition of moderate degree    Hypoxic respiratory failure Bilateral pleural effusions - This is multifactorial due to sarcoidosis, and likely component of COPD - Has successfully weaned towards 2 L now.  Does have some recorded desaturations but this is because she keeps removing her nasal cannula. - May require oxygen at baseline now.  Have ordered home O2 eval and home oxygen.  Patient needs to acquire her prosthesis to participate in walking trial. -Initial effusions on arrival left greater than right.  No PE on CTA - Status post diagnostic and therapeutic thoracentesis 11/25.  Gram stain negative, culture negative.  LD very elevated, cyto path negative.  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  Will continue for 5 total days of therapy or longer if patient requires it. - No infectious signs or symptoms - Negative RVP - Continue with breathing treatments - Added Breo Ellipta  11/26 - Pulmonology consulted, appreciate recommendations - Continue with diuresis IV Lasix  40 twice daily.  Kidney function is tolerating. - Echo with EF 60 to 65%, no regional wall motion abnormalities, LV diastolic parameters normal.  No significant valvular abnormalities  Sarcoidosis - Pulmonary consult to Dr. Fernand - On [place], as needed DuoNebs - Patient reports prior to this she was mostly asymptomatic - Now on 5 days steroid therapy.  Some improvement today.  Continue to wean O2 as tolerated  History of low back pain Limb  pain - Continue home dose duloxetine  and gabapentin  - Spinal cord stimulator in place - PT/OT recommending SNF.  Patient has refused this recommendation.  She is amenable to home health which has been ordered.  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 - Continues to improve with  diuretic use - Continue to trend CMP  Sinus tachycardia, chronic - Resolved.  Continue home digoxin   PAD CAD Hypertension - Resume losartan  as BP tolerates.  Currently requiring diuretics  Status post right AKA - Continue use of prosthetic leg  COPD - Does not carry official diagnosis but there is evidence of emphysema on CTA - Has 50-pack-year smoking, quit 30 years ago - Pulmonary consult as above, will need outpatient follow-up for PFTs.  Has been started on Breo  High risk aspiration secondary to increased work of breathing - Speech therapy working with patient.  Continue with dysphagia diet  Body mass index is 23.28 kg/m.   DVT prophylaxis: Eliquis    Code Status: Limited: Do not attempt resuscitation (DNR) -DNR-LIMITED -Do Not Intubate/DNI  Disposition:  Inpatient pending resolution in breathing, will go home with home health.  Patient has refused SNF recommendation. Have ordered home O2  Consultants:    Procedures:    Antimicrobials:  Anti-infectives (From admission, onward)    None       Data Reviewed: I have personally reviewed following labs and imaging studies CBC: Recent Labs  Lab 07/14/24 0426 07/15/24 0341 07/16/24 0341 07/17/24 0825 07/18/24 0423  WBC 16.5* 13.9* 11.7* 11.0* 12.8*  NEUTROABS  --   --   --  8.8* 11.3*  HGB 15.8* 16.0* 15.3* 15.1* 14.2  HCT 46.9* 49.9* 45.1 45.4 41.8  MCV 95.9 101.2* 93.6 95.4 95.0  PLT 228 240 229 257 264   Basic Metabolic Panel: Recent Labs  Lab 07/14/24 0515 07/15/24 0341 07/16/24 0341 07/17/24 0825 07/18/24 0423  NA 130* 128* 130* 133* 132*  K 4.0 3.6 3.6 3.9 4.3  CL 97* 93* 94* 95* 93*  CO2 21* 19* 23 28 29   GLUCOSE 140* 127* 120* 127* 166*  BUN 17 24* 33* 29* 29*  CREATININE 0.72 0.90 0.87 0.74 0.68  CALCIUM  8.9 8.5* 8.5* 8.9 8.9  MG  --   --   --   --  2.6*  PHOS  --   --   --   --  4.1   GFR: Estimated Creatinine Clearance: 53.4 mL/min (by C-G formula based on SCr of 0.68  mg/dL). Liver Function Tests: Recent Labs  Lab 07/14/24 0616 07/17/24 0825 07/18/24 0423  AST 28 51* 53*  ALT 9 27 40  ALKPHOS 70 78 75  BILITOT 1.4* 0.5 0.3  PROT 7.5 7.2 6.9  ALBUMIN 3.2* 2.8* 2.9*   CBG: No results for input(s): GLUCAP in the last 168 hours.  Recent Results (from the past 240 hours)  Resp panel by RT-PCR (RSV, Flu A&B, Covid) Anterior Nasal Swab     Status: None   Collection Time: 07/14/24  5:02 AM   Specimen: Anterior Nasal Swab  Result Value Ref Range Status   SARS Coronavirus 2 by RT PCR NEGATIVE NEGATIVE Final    Comment: (NOTE) SARS-CoV-2 target nucleic acids are NOT DETECTED.  The SARS-CoV-2 RNA is generally detectable in upper respiratory specimens during the acute phase of infection. The lowest concentration of SARS-CoV-2 viral copies this assay can detect is 138 copies/mL. A negative result does not preclude SARS-Cov-2 infection and should not  be used as the sole basis for treatment or other patient management decisions. A negative result may occur with  improper specimen collection/handling, submission of specimen other than nasopharyngeal swab, presence of viral mutation(s) within the areas targeted by this assay, and inadequate number of viral copies(<138 copies/mL). A negative result must be combined with clinical observations, patient history, and epidemiological information. The expected result is Negative.  Fact Sheet for Patients:  bloggercourse.com  Fact Sheet for Healthcare Providers:  seriousbroker.it  This test is no t yet approved or cleared by the [place]  FDA and  has been authorized for detection and/or diagnosis of SARS-CoV-2 by FDA under an Emergency Use Authorization (EUA). This EUA will remain  in effect (meaning this test can be used) for the duration of the COVID-19 declaration under Section 564(b)(1) of the Act, 21 U.S.C.section 360bbb-3(b)(1), unless the  authorization is terminated  or revoked sooner.       Influenza A by PCR NEGATIVE NEGATIVE Final   Influenza B by PCR NEGATIVE NEGATIVE Final    Comment: (NOTE) The Xpert Xpress SARS-CoV-2/FLU/RSV plus assay is intended as an aid in the diagnosis of influenza from Nasopharyngeal swab specimens and should not be used as a sole basis for treatment. Nasal washings and aspirates are unacceptable for Xpert Xpress SARS-CoV-2/FLU/RSV testing.  Fact Sheet for Patients: bloggercourse.com  Fact Sheet for Healthcare Providers: seriousbroker.it  This test is not yet approved or cleared by the [place]  FDA and has been authorized for detection and/or diagnosis of SARS-CoV-2 by FDA under an Emergency Use Authorization (EUA). This EUA will remain in effect (meaning this test can be used) for the duration of the COVID-19 declaration under Section 564(b)(1) of the Act, 21 U.S.C. section 360bbb-3(b)(1), unless the authorization is terminated or revoked.     Resp Syncytial Virus by PCR NEGATIVE NEGATIVE Final    Comment: (NOTE) Fact Sheet for Patients: bloggercourse.com  Fact Sheet for Healthcare Providers: seriousbroker.it  This test is not yet approved or cleared by the [place]  FDA and has been authorized for detection and/or diagnosis of SARS-CoV-2 by FDA under an Emergency Use Authorization (EUA). This EUA will remain in effect (meaning this test can be used) for the duration of the COVID-19 declaration under Section 564(b)(1) of the Act, 21 U.S.C. section 360bbb-3(b)(1), unless the authorization is terminated or revoked.  Performed at [facility], [address] [place], [place] [address]   Blood culture (routine x 2)     Status: None (Preliminary result)   Collection Time: 07/14/24  5:49 AM   Specimen: BLOOD  Result Value Ref Range Status   Specimen  Description BLOOD BLOOD LEFT ARM  Final   Special Requests   Final    BOTTLES DRAWN AEROBIC AND ANAEROBIC Blood Culture results may not be optimal due to an inadequate volume of blood received in culture bottles   Culture   Final    NO GROWTH 4 DAYS Performed at [facility], [address] [place], [place] [address]    Report Status PENDING  Incomplete  Blood culture (routine x 2)     Status: None (Preliminary result)   Collection Time: 07/14/24  5:49 AM   Specimen: BLOOD  Result Value Ref Range Status   Specimen Description BLOOD BLOOD LEFT ARM  Final   Special Requests   Final    BOTTLES DRAWN AEROBIC AND ANAEROBIC Blood Culture results may not be optimal due to an inadequate volume of blood received in culture bottles  Culture   Final    NO GROWTH 4 DAYS Performed at [facility], [address] [place], [place] [address]    Report Status PENDING  Incomplete  Body fluid culture w Gram Stain     Status: None (Preliminary result)   Collection Time: 07/14/24  2:05 PM   Specimen: PATH Cytology Pleural fluid  Result Value Ref Range Status   Specimen Description   Final    PLEURAL Performed at [facility], [address] [place], [place] [address]    Special Requests   Final    NONE Performed at [facility], [address] [place], [place] [address]    Gram Stain   Final    FEW WBC PRESENT,BOTH PMN AND MONONUCLEAR NO ORGANISMS SEEN    Culture   Final    NO GROWTH 3 DAYS Performed at [facility], [address] [place], [place] [address]    Report Status PENDING  Incomplete     Radiology Studies: No results found.   Scheduled Meds:  apixaban   2.5 mg Oral BID   digoxin   0.125 mg Oral Daily   DULoxetine   60 mg Oral BID   feeding supplement  237 mL Oral TID BM   fluticasone  furoate-vilanterol  1 puff Inhalation Daily   ipratropium-albuterol   3 mL Nebulization Q6H   ivabradine   7.5 mg Oral BID WC   multivitamin  with minerals  1 tablet Oral Daily   polyethylene glycol  17 g Oral Daily   Continuous Infusions:   LOS: 4 days  MDM: Patient is high risk for one or more organ failure.  They necessitate ongoing hospitalization for continued IV therapies and subsequent lab monitoring. Total time spent interpreting labs and vitals, reviewing the medical record, coordinating care amongst consultants and care team members, directly assessing and discussing care with the patient and/or family: 55 min  Sara Kivi, DO [facility]  To contact the attending physician between 7A-7P please use Epic Chat. To contact the covering physician during after hours 7P-7A, please review Amion.  07/18/2024, 8:40 AM   *This document has been created with the assistance of dictation software. Please excuse typographical errors. *

## 2024-07-18 NOTE — TOC Progression Note (Signed)
 Transition of Care [facility]) - Progression Note    Patient Details  Name: Sara [name] MRN: [id number] Date of Birth: 13-Jan-1951  Transition of [facility]) CM/SW Contact  Alfonso Rummer, LCSW Phone Number: 07/18/2024, 3:17 PM  Clinical Narrative:     KEN DELENA Rummer met with pt at bedside in room 237. Pt accepts home health offers and accepts amedysis. Pt reports she lives alone and needs additional assistance in the home.                     Expected Discharge Plan and Services                                               Social Drivers of Health (SDOH) Interventions SDOH Screenings   Food Insecurity: No Food Insecurity (07/14/2024)  Housing: Low Risk  (07/14/2024)  Transportation Needs: No Transportation Needs (07/14/2024)  Utilities: Not At Risk (07/14/2024)  Depression (PHQ2-9): Low Risk  (05/22/2023)  Financial Resource Strain: Patient Declined (07/16/2023)   Received from [facility]  Social Connections: Socially Isolated (07/14/2024)  Tobacco Use: Medium Risk (06/16/2024)    Readmission Risk Interventions     No data to display

## 2024-07-19 LAB — CBC WITH DIFFERENTIAL/PLATELET
Abs Immature Granulocytes: 0.49 K/uL — ABNORMAL HIGH (ref 0.00–0.07)
Basophils Absolute: 0 K/uL (ref 0.0–0.1)
Basophils Relative: 0 %
Eosinophils Absolute: 0.1 K/uL (ref 0.0–0.5)
Eosinophils Relative: 1 %
HCT: 43.2 % (ref 36.0–46.0)
Hemoglobin: 14.4 g/dL (ref 12.0–15.0)
Immature Granulocytes: 4 %
Lymphocytes Relative: 5 %
Lymphs Abs: 0.6 K/uL — ABNORMAL LOW (ref 0.7–4.0)
MCH: 32.2 pg (ref 26.0–34.0)
MCHC: 33.3 g/dL (ref 30.0–36.0)
MCV: 96.6 fL (ref 80.0–100.0)
Monocytes Absolute: 0.4 K/uL (ref 0.1–1.0)
Monocytes Relative: 3 %
Neutro Abs: 11.4 K/uL — ABNORMAL HIGH (ref 1.7–7.7)
Neutrophils Relative %: 87 %
Platelets: 311 K/uL (ref 150–400)
RBC: 4.47 MIL/uL (ref 3.87–5.11)
RDW: 13.2 % (ref 11.5–15.5)
WBC: 13 K/uL — ABNORMAL HIGH (ref 4.0–10.5)
nRBC: 0 % (ref 0.0–0.2)

## 2024-07-19 LAB — COMPREHENSIVE METABOLIC PANEL WITH GFR
ALT: 52 U/L — ABNORMAL HIGH (ref 0–44)
AST: 55 U/L — ABNORMAL HIGH (ref 15–41)
Albumin: 2.9 g/dL — ABNORMAL LOW (ref 3.5–5.0)
Alkaline Phosphatase: 74 U/L (ref 38–126)
Anion gap: 10 (ref 5–15)
BUN: 30 mg/dL — ABNORMAL HIGH (ref 8–23)
CO2: 29 mmol/L (ref 22–32)
Calcium: 8.9 mg/dL (ref 8.9–10.3)
Chloride: 94 mmol/L — ABNORMAL LOW (ref 98–111)
Creatinine, Ser: 0.66 mg/dL (ref 0.44–1.00)
GFR, Estimated: >60 mL/min (ref >60)
Glucose, Bld: 169 mg/dL — ABNORMAL HIGH (ref 70–99)
Potassium: 4.6 mmol/L (ref 3.5–5.1)
Sodium: 134 mmol/L — ABNORMAL LOW (ref 135–145)
Total Bilirubin: 0.2 mg/dL (ref 0.0–1.2)
Total Protein: 6.7 g/dL (ref 6.5–8.1)

## 2024-07-19 LAB — CULTURE, BLOOD (ROUTINE X 2)
Culture: NO GROWTH
Culture: NO GROWTH

## 2024-07-19 LAB — MAGNESIUM: Magnesium: 2.4 mg/dL (ref 1.7–2.4)

## 2024-07-19 LAB — PHOSPHORUS: Phosphorus: 3.7 mg/dL (ref 2.5–4.6)

## 2024-07-19 NOTE — Plan of Care (Signed)

## 2024-07-19 NOTE — Progress Notes (Signed)
 Keys taken with pt

## 2024-07-19 NOTE — Progress Notes (Signed)
 Pt taken to 1 c with clothing, spinal stimulater, x2 chargers, x2 cell phones, purse, prosthesis, sleeve, upper dentures,$22 cash, x1 wf debit, bag

## 2024-07-19 NOTE — Progress Notes (Signed)
 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  On 11/25 she underwent IR guided left thoracentesis.  Pulmonology was also consulted. Patient was started on prednisone  and began to wean from high flow nasal cannula.  Still very dyspneic and moving very slowly with therapy services  Subjective: Patient reports she continues to feel better.  No muscle stiffness today.  She reports her breathing is easier.  She was able to obtain her prosthesis and work with physical therapy. We discussed her generalized deconditioning and that she may have some difficulty ambulating independently at home.  She endorses agreement and would like to pursue skilled nursing facility placement for short course of rehab  Objective: Vitals:   07/19/24 0759 07/19/24 0823 07/19/24 0943 07/19/24 1151  BP:  (!) 113/59  (!) 118/57  Pulse:  78 72 86  Resp:    16  Temp:  97.8 F (36.6 C)  97.8 F (36.6 C)  TempSrc:  Oral  Oral  SpO2: 97% 94%  92%  Weight:        Intake/Output Summary (Last 24 hours) at 07/19/2024 1508 Last data filed at 07/19/2024 1408 Gross per 24 hour  Intake 240 ml  Output 1010 ml  Net -770 ml   Filed Weights   07/14/24 0411  Weight: 59.6 kg    Examination: General exam: Appears calm and comfortable, NAD  Respiratory system: Improvement in aeration bilaterally, still dyspneic when speaking.  Off oxygen at the time of evaluation.   Cardiovascular system: S1 & S2 heard, RRR.  Gastrointestinal system: Abdomen is nondistended,  soft and nontender.  Neuro: Alert and oriented. No focal neurological deficits. Extremities: Symmetric, expected ROM Skin: No rashes, lesions Psychiatry: Demonstrates appropriate judgement and insight. Mood & affect appropriate for situation.   Assessment & Plan:  Principal Problem:   Pleural effusion Active Problems:   Malnutrition of moderate degree    Hypoxic respiratory failure Bilateral pleural effusions - This is multifactorial due to sarcoidosis, and likely component of COPD - Has successfully weaned towards 2 L now.  Does have some recorded desaturations but this is because she keeps removing her nasal cannula. - May require oxygen at baseline now.  Though with continued therapy services at SNF she may be able to wean entirely before discharging home.  Suspect much of her O2 requirement now is due to shallow respirations and atelectasis as she has been minimally ambulatory - Continue to encourage aggressive PT/OT - Initial effusions on arrival left greater than right.  No PE on CTA - Status post diagnostic and therapeutic thoracentesis 11/25.  Gram stain negative, culture negative.  LD very elevated, cyto path negative.  Clinical picture most consistent with Sarcoid Associated pulmonary effusions - Have started steroids on 11/27 with significant improvement.  Will continue for 5 total days of therapy or longer if patient requires it. - No infectious signs or symptoms - Negative RVP - Continue with breathing treatments - Added Breo Ellipta  11/26 - Pulmonology consulted, appreciate recommendations - Continue with diuresis IV Lasix  40 twice daily.  Kidney function is tolerating. - Echo  with EF 60 to 65%, no regional wall motion abnormalities, LV diastolic parameters normal.  No significant valvular abnormalities  Sarcoidosis - Pulmonary consult to Dr. Fernand - On [place], as needed DuoNebs - Patient reports prior to this she was mostly asymptomatic - Now on 5 days steroid therapy.   Having significant improvement since initiation  History of low back pain Limb pain - Continue home dose duloxetine  and gabapentin  - Spinal cord stimulator in place - PT/OT recommending SNF.  TOC aware.  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 - Continues to improve with diuretic use - Continue to trend CMP  Sinus tachycardia, chronic - Resolved.  Continue home digoxin   PAD CAD Hypertension - Resume losartan  as BP tolerates.  Currently requiring diuretics  Status post right AKA - Continue use of prosthetic leg  COPD - Does not carry official diagnosis but there is evidence of emphysema on CTA - Has 50-pack-year smoking, quit 30 years ago - Pulmonary consult as above, will need outpatient follow-up for PFTs.  Has been started on Breo  High risk aspiration secondary to increased work of breathing - Speech therapy working with patient.  Continue with dysphagia diet  Body mass index is 23.28 kg/m.   DVT prophylaxis: Eliquis    Code Status: Limited: Do not attempt resuscitation (DNR) -DNR-LIMITED -Do Not Intubate/DNI  Disposition: Likely medically ready for discharge tomorrow, pending SNF placement  Consultants:    Procedures:    Antimicrobials:  Anti-infectives (From admission, onward)    None       Data Reviewed: I have personally reviewed following labs and imaging studies CBC: Recent Labs  Lab 07/15/24 0341 07/16/24 0341 07/17/24 0825 07/18/24 0423 07/19/24 0446  WBC 13.9* 11.7* 11.0* 12.8* 13.0*  NEUTROABS  --   --  8.8* 11.3* 11.4*  HGB 16.0* 15.3* 15.1* 14.2 14.4  HCT 49.9* 45.1 45.4 41.8 43.2  MCV 101.2* 93.6 95.4 95.0 96.6  PLT 240 229 257 264 311   Basic Metabolic Panel: Recent Labs  Lab 07/15/24 0341 07/16/24 0341 07/17/24 0825 07/18/24 0423 07/19/24 0446  NA 128* 130* 133* 132* 134*  K 3.6 3.6 3.9 4.3 4.6  CL 93* 94* 95* 93* 94*  CO2 19* 23 28 29 29   GLUCOSE  127* 120* 127* 166* 169*  BUN 24* 33* 29* 29* 30*  CREATININE 0.90 0.87 0.74 0.68 0.66  CALCIUM  8.5* 8.5* 8.9 8.9 8.9  MG  --   --   --  2.6* 2.4  PHOS  --   --   --  4.1 3.7   GFR: Estimated Creatinine Clearance: 53.4 mL/min (by C-G formula based on SCr of 0.66 mg/dL). Liver Function Tests: Recent Labs  Lab 07/14/24 0616 07/17/24 0825 07/18/24 0423 07/19/24 0446  AST 28 51* 53* 55*  ALT 9 27 40 52*  ALKPHOS 70 78 75 74  BILITOT 1.4* 0.5 0.3 0.2  PROT 7.5 7.2 6.9 6.7  ALBUMIN 3.2* 2.8* 2.9* 2.9*   CBG: No results for input(s): GLUCAP in the last 168 hours.  Recent Results (from the past 240 hours)  Resp panel by RT-PCR (RSV, Flu A&B, Covid) Anterior Nasal Swab     Status: None   Collection Time: 07/14/24  5:02 AM   Specimen: Anterior Nasal Swab  Result Value Ref Range Status   SARS Coronavirus 2 by RT PCR NEGATIVE NEGATIVE Final    Comment: (NOTE) SARS-CoV-2 target nucleic acids are NOT DETECTED.  The SARS-CoV-2 RNA  is generally detectable in upper respiratory specimens during the acute phase of infection. The lowest concentration of SARS-CoV-2 viral copies this assay can detect is 138 copies/mL. A negative result does not preclude SARS-Cov-2 infection and should not be used as the sole basis for treatment or other patient management decisions. A negative result may occur with  improper specimen collection/handling, submission of specimen other than nasopharyngeal swab, presence of viral mutation(s) within the areas targeted by this assay, and inadequate number of viral copies(<138 copies/mL). A negative result must be combined with clinical observations, patient history, and epidemiological information. The expected result is Negative.  Fact Sheet for Patients:  bloggercourse.com  Fact Sheet for Healthcare Providers:  seriousbroker.it  This test is no t yet approved or cleared by the [place]  FDA and  has  been authorized for detection and/or diagnosis of SARS-CoV-2 by FDA under an Emergency Use Authorization (EUA). This EUA will remain  in effect (meaning this test can be used) for the duration of the COVID-19 declaration under Section 564(b)(1) of the Act, 21 U.S.C.section 360bbb-3(b)(1), unless the authorization is terminated  or revoked sooner.       Influenza A by PCR NEGATIVE NEGATIVE Final   Influenza B by PCR NEGATIVE NEGATIVE Final    Comment: (NOTE) The Xpert Xpress SARS-CoV-2/FLU/RSV plus assay is intended as an aid in the diagnosis of influenza from Nasopharyngeal swab specimens and should not be used as a sole basis for treatment. Nasal washings and aspirates are unacceptable for Xpert Xpress SARS-CoV-2/FLU/RSV testing.  Fact Sheet for Patients: bloggercourse.com  Fact Sheet for Healthcare Providers: seriousbroker.it  This test is not yet approved or cleared by the [place]  FDA and has been authorized for detection and/or diagnosis of SARS-CoV-2 by FDA under an Emergency Use Authorization (EUA). This EUA will remain in effect (meaning this test can be used) for the duration of the COVID-19 declaration under Section 564(b)(1) of the Act, 21 U.S.C. section 360bbb-3(b)(1), unless the authorization is terminated or revoked.     Resp Syncytial Virus by PCR NEGATIVE NEGATIVE Final    Comment: (NOTE) Fact Sheet for Patients: bloggercourse.com  Fact Sheet for Healthcare Providers: seriousbroker.it  This test is not yet approved or cleared by the [place]  FDA and has been authorized for detection and/or diagnosis of SARS-CoV-2 by FDA under an Emergency Use Authorization (EUA). This EUA will remain in effect (meaning this test can be used) for the duration of the COVID-19 declaration under Section 564(b)(1) of the Act, 21 U.S.C. section 360bbb-3(b)(1), unless the  authorization is terminated or revoked.  Performed at [facility], [address] [place], [place] [address]   Blood culture (routine x 2)     Status: None   Collection Time: 07/14/24  5:49 AM   Specimen: BLOOD  Result Value Ref Range Status   Specimen Description BLOOD BLOOD LEFT ARM  Final   Special Requests   Final    BOTTLES DRAWN AEROBIC AND ANAEROBIC Blood Culture results may not be optimal due to an inadequate volume of blood received in culture bottles   Culture   Final    NO GROWTH 5 DAYS Performed at [facility], [address] [place], [place] [address]    Report Status 07/19/2024 FINAL  Final  Blood culture (routine x 2)     Status: None   Collection Time: 07/14/24  5:49 AM   Specimen: BLOOD  Result Value Ref Range Status   Specimen Description BLOOD BLOOD LEFT ARM  Final   Special Requests   Final    BOTTLES DRAWN AEROBIC AND ANAEROBIC Blood Culture results may not be optimal due to an inadequate volume of blood received in culture bottles   Culture   Final    NO GROWTH 5 DAYS Performed at [facility], [address] [place], [place] [address]    Report Status 07/19/2024 FINAL  Final  Body fluid culture w Gram Stain     Status: None   Collection Time: 07/14/24  2:05 PM   Specimen: PATH Cytology Pleural fluid  Result Value Ref Range Status   Specimen Description   Final    PLEURAL Performed at [facility], [address], [place], [place] [address]    Special Requests   Final    NONE Performed at [facility], [address] [place], [place] [address]    Gram Stain   Final    FEW WBC PRESENT,BOTH PMN AND MONONUCLEAR NO ORGANISMS SEEN    Culture   Final    NO GROWTH 4 DAYS Performed at [facility], [address] [place], [place] [address]    Report Status 07/18/2024 FINAL  Final     Radiology Studies: No results found.   Scheduled Meds:  apixaban   2.5 mg Oral BID   digoxin   0.125  mg Oral Daily   DULoxetine   60 mg Oral BID   feeding supplement  237 mL Oral TID BM   fluticasone  furoate-vilanterol  1 puff Inhalation Daily   ipratropium-albuterol   3 mL Nebulization BID   ivabradine   7.5 mg Oral BID WC   multivitamin with minerals  1 tablet Oral Daily   polyethylene glycol  17 g Oral Daily   predniSONE   50 mg Oral Daily   Continuous Infusions:   LOS: 5 days  MDM: Patient is high risk for one or more organ failure.  They necessitate ongoing hospitalization for continued IV therapies and subsequent lab monitoring. Total time spent interpreting labs and vitals, reviewing the medical record, coordinating care amongst consultants and care team members, directly assessing and discussing care with the patient and/or family: 55 min  Cortlandt Capuano, DO [facility]  To contact the attending physician between 7A-7P please use Epic Chat. To contact the covering physician during after hours 7P-7A, please review Amion.  07/19/2024, 3:08 PM   *This document has been created with the assistance of dictation software. Please excuse typographical errors. *

## 2024-07-20 LAB — CBC WITH DIFFERENTIAL/PLATELET
Abs Immature Granulocytes: 1.11 K/uL — ABNORMAL HIGH (ref 0.00–0.07)
Basophils Absolute: 0.1 K/uL (ref 0.0–0.1)
Basophils Relative: 0 %
Eosinophils Absolute: 0 K/uL (ref 0.0–0.5)
Eosinophils Relative: 0 %
HCT: 40 % (ref 36.0–46.0)
Hemoglobin: 13.5 g/dL (ref 12.0–15.0)
Immature Granulocytes: 7 %
Lymphocytes Relative: 8 %
Lymphs Abs: 1.3 K/uL (ref 0.7–4.0)
MCH: 32.6 pg (ref 26.0–34.0)
MCHC: 33.8 g/dL (ref 30.0–36.0)
MCV: 96.6 fL (ref 80.0–100.0)
Monocytes Absolute: 1.1 K/uL — ABNORMAL HIGH (ref 0.1–1.0)
Monocytes Relative: 7 %
Neutro Abs: 12.8 K/uL — ABNORMAL HIGH (ref 1.7–7.7)
Neutrophils Relative %: 78 %
Platelets: 327 K/uL (ref 150–400)
RBC: 4.14 MIL/uL (ref 3.87–5.11)
RDW: 13.2 % (ref 11.5–15.5)
Smear Review: NORMAL
WBC: 16.4 K/uL — ABNORMAL HIGH (ref 4.0–10.5)
nRBC: 0 % (ref 0.0–0.2)

## 2024-07-20 LAB — COMPREHENSIVE METABOLIC PANEL WITH GFR
ALT: 46 U/L — ABNORMAL HIGH (ref 0–44)
AST: 40 U/L (ref 15–41)
Albumin: 2.8 g/dL — ABNORMAL LOW (ref 3.5–5.0)
Alkaline Phosphatase: 73 U/L (ref 38–126)
Anion gap: 9 (ref 5–15)
BUN: 28 mg/dL — ABNORMAL HIGH (ref 8–23)
CO2: 30 mmol/L (ref 22–32)
Calcium: 8.9 mg/dL (ref 8.9–10.3)
Chloride: 96 mmol/L — ABNORMAL LOW (ref 98–111)
Creatinine, Ser: 0.62 mg/dL (ref 0.44–1.00)
GFR, Estimated: >60 mL/min (ref >60)
Glucose, Bld: 232 mg/dL — ABNORMAL HIGH (ref 70–99)
Potassium: 4.2 mmol/L (ref 3.5–5.1)
Sodium: 136 mmol/L (ref 135–145)
Total Bilirubin: 0.2 mg/dL (ref 0.0–1.2)
Total Protein: 6.6 g/dL (ref 6.5–8.1)

## 2024-07-20 LAB — MAGNESIUM: Magnesium: 2.3 mg/dL (ref 1.7–2.4)

## 2024-07-20 LAB — PHOSPHORUS: Phosphorus: 3.1 mg/dL (ref 2.5–4.6)

## 2024-07-20 MED ORDER — IPRATROPIUM-ALBUTEROL 0.5-2.5 (3) MG/3ML IN SOLN
3.0000 mL | RESPIRATORY_TRACT | Status: DC | PRN
  Administered 2024-07-22 – 2024-07-24 (×4): 3 mL via RESPIRATORY_TRACT
  Filled 2024-07-20 (×4): qty 3

## 2024-07-20 NOTE — Progress Notes (Signed)
 Physical Therapy Treatment Patient Details Name: Sara [name] MRN: [id number] DOB: November 15, 1950 Today's Date: 07/20/2024   History of Present Illness Sara [name] is a 73 y.o. 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  Presented to ER secondary to persistent cough, SOB; admitted for management of acute hypoxic respiratory failure secondary to bilat pleural effusion, sarcoidosis.  Hospital course significant for thoracentesis (11/24) with removed.    PT Comments  Pt motivated to participate.  She is able to get to EOB with bed features and is steady in sitting.  She is able to squat pivot to [facility] for large soft BM and void.  Min a x 1 for balance during standing personal care.  She is generally fatigued with activity but does want to try walking.  She dons prosthesis with min a x 1 in standing.  Stated she typically walks with no AD at home without difficulty but does need RW support for 30' gait with cga/min a x 2 for safety and to manage O2 tank.  Pt does well with mobility on 2 lpm - sats +/- 90 with O2 support.  She does remain up in chair after sessio with needs met.   Pt wishes to return home vs rehab.  She seems to have all equipment needed but does not seem to have a lot of assist at home. Stated she lives alone. Did not use O2 at baseline but still needs it this date.  Quick trial of no O2 for transfers to [facility] and sats decreased to 87% but quickly returned with support.  It would be challenging for her to manage O2 on her own at this point.  She is pretty firm about returning home vs rehab but given overall decreased strength, balance and activity tolerance she would likely struggle and be at increased fall risk if she does decide to return home.  She would benefit from any [facility] services available to her.     SaO2 on room air at rest = 90% SaO2 on room air while ambulating = 87% SaO2 on 2 liters of O2 while ambulating = 91%    If plan is discharge home, recommend the following: A little help with walking and/or transfers;A little help with bathing/dressing/bathroom;Help with stairs or ramp for entrance;Assistance with cooking/housework;Assist for transportation   Can travel by private vehicle        Equipment Recommendations       Recommendations for Other Services       Precautions / Restrictions Precautions Precautions: Fall Recall of Precautions/Restrictions: Intact Precaution/Restrictions Comments: R AKA Restrictions Weight Bearing Restrictions Per Provider Order: No     Mobility  Bed Mobility Overal bed mobility: Needs Assistance Bed Mobility: Supine to Sit, Sit to Supine     Supine to sit: Supervision Sit to supine: Supervision     Patient Response: Cooperative  Transfers Overall transfer level: Needs assistance Equipment used: Rolling walker (2 wheels), None Transfers: Sit to/from Stand, Bed to chair/wheelchair/BSC Sit to Stand: Min assist, +2 safety/equipment     Squat pivot transfers: Contact guard assist, Min assist     General transfer comment: able to transfer with set up to [facility]    Ambulation/Gait Ambulation/Gait assistance: Min assist, Contact guard assist, +2 safety/equipment Gait Distance (Feet): 30 Feet Assistive device: Rolling walker (2 wheels) Gait Pattern/deviations: Step-through pattern, Decreased  step length - right, Decreased step length - left Gait velocity: dec     General Gait Details: able to walk to door and back with RW, O2, prosthetic and +2 assist for safety   Stairs             Wheelchair Mobility     Tilt Bed Tilt Bed Patient Response: Cooperative  Modified Rankin (Stroke Patients Only)       Balance Overall balance assessment: Needs assistance Sitting-balance support: No upper extremity supported, Feet  supported Sitting balance-Leahy Scale: Good     Standing balance support: Bilateral upper extremity supported Standing balance-Leahy Scale: Fair                              Hotel Manager: No apparent difficulties  Cognition Arousal: Alert Behavior During Therapy: WFL for tasks assessed/performed, Flat affect   PT - Cognitive impairments: No apparent impairments                       PT - Cognition Comments: generally soft spoken, flat affect Following commands: Intact      Cueing Cueing Techniques: Verbal cues  Exercises      General Comments        Pertinent Vitals/Pain Pain Assessment Pain Assessment: No/denies pain Pain Intervention(s): Monitored during session    Home Living                          Prior Function            PT Goals (current goals can now be found in the care plan section) Progress towards PT goals: Progressing toward goals    Frequency    Min 2X/week      PT Plan      Co-evaluation              AM-PAC PT 6 Clicks Mobility   Outcome Measure  Help needed turning from your back to your side while in a flat bed without using bedrails?: None Help needed moving from lying on your back to sitting on the side of a flat bed without using bedrails?: A Little Help needed moving to and from a bed to a chair (including a wheelchair)?: A Little Help needed standing up from a chair using your arms (e.g., wheelchair or bedside chair)?: A Little Help needed to walk in hospital room?: A Little Help needed climbing 3-5 steps with a railing? : A Lot 6 Click Score: 18    End of Session Equipment Utilized During Treatment: Gait belt;Oxygen Activity Tolerance: Patient tolerated treatment well;Patient limited by fatigue Patient left: in chair;with call bell/phone within reach;with chair alarm set Nurse Communication: Mobility status PT Visit Diagnosis: Muscle weakness  (generalized) (M62.81);Difficulty in walking, not elsewhere classified (R26.2)     Time: 1419-1440 PT Time Calculation (min) (ACUTE ONLY): 21 min  Charges:    $Gait Training: 8-22 mins PT General Charges $$ ACUTE PT VISIT: 1 Visit                   Lauraine Gills, PTA 07/20/24, 2:55 PM

## 2024-07-20 NOTE — Plan of Care (Signed)

## 2024-07-20 NOTE — Progress Notes (Signed)
 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  On 11/25 she underwent IR guided left thoracentesis.  Pulmonology was also consulted. Patient was started on prednisone  and began to wean from high flow nasal cannula.  Still very dyspneic and moving very slowly with therapy services. Now awaiting SNF placement.   Subjective: No acute events overnight.  Patient is sleeping on arrival.  She requests to continue sleeping.  Has no acute complaints  Objective: Vitals:   07/19/24 2044 07/20/24 0424 07/20/24 0712 07/20/24 0746  BP:  (!) 118/50  (!) 109/48  Pulse:  78  81  Resp:  18  14  Temp:  98 F (36.7 C)  97.9 F (36.6 C)  TempSrc:  Oral  Oral  SpO2: 90% 91% 93% 91%  Weight:        Intake/Output Summary (Last 24 hours) at 07/20/2024 1453 Last data filed at 07/20/2024 1300 Gross per 24 hour  Intake 240 ml  Output --  Net 240 ml   Filed Weights   07/14/24 0411  Weight: 59.6 kg    Examination: General exam: Appears calm and comfortable, NAD  Respiratory system: Improvement in aeration bilaterally, 2L [place] in place    Cardiovascular system: S1 & S2 heard, RRR.  Gastrointestinal system: Abdomen is nondistended, soft and nontender.  Neuro: Alert and oriented. No focal neurological deficits. Extremities: Symmetric, expected ROM Skin: No rashes, lesions Psychiatry: Demonstrates appropriate judgement and insight. Mood & affect appropriate for situation.   Assessment & Plan:  Principal Problem:   Pleural effusion Active  Problems:   Malnutrition of moderate degree    Hypoxic respiratory failure Bilateral pleural effusions - This is multifactorial due to sarcoidosis, and likely component of COPD - Has successfully weaned to 2 L now - May require oxygen at baseline though with continued therapy at SNF she may able to wean entirely before discharging home. - Suspect much of her O2 requirement now is due to shallow respirations and atelectasis as she is very deconditioned. - Continue to encourage aggressive PT/OT, pending SNF placement - Initial effusions on arrival left greater than right.  No PE on CTA - Status post diagnostic and therapeutic thoracentesis 11/25.  Gram stain negative, culture negative.  LD very elevated, cyto path negative.  Clinical picture most consistent with Sarcoid Associated pulmonary effusions - Have started steroids on 11/27 with significant improvement.  Will continue for 5 total days of therapy or longer if patient requires it. - No infectious signs or symptoms - Negative RVP - Continue with breathing treatments - Added Breo Ellipta  11/26 - Pulmonology consulted, appreciate recommendations - Continue with diuresis IV Lasix  40 twice daily.  Kidney function is tolerating. - Echo with EF 60 to 65%, no regional wall motion abnormalities, LV diastolic parameters normal.  No significant valvular abnormalities  Sarcoidosis - Pulmonary consult to Dr. Fernand - On [place], as needed DuoNebs - Patient reports prior to this she was mostly asymptomatic - Now on 5 days steroid therapy.  Having significant improvement since initiation  History of low back pain Limb pain - Continue  home dose duloxetine  and gabapentin  - Spinal cord stimulator in place - PT/OT recommending SNF.  TOC aware.  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 - Continues to improve with diuretic use - Continue to trend CMP  Sinus tachycardia,  chronic - Resolved.  Continue home digoxin   PAD CAD Hypertension - Resume losartan  as BP tolerates.  Currently requiring diuretics  Status post right AKA - Continue use of prosthetic leg  COPD - Does not carry official diagnosis but there is evidence of emphysema on CTA - Has 50-pack-year smoking, quit 30 years ago - Pulmonary consult as above, will need outpatient follow-up for PFTs.  Has been started on Breo  High risk aspiration secondary to increased work of breathing - Speech therapy working with patient.  Continue with dysphagia diet  Body mass index is 23.28 kg/m.   DVT prophylaxis: Eliquis    Code Status: Limited: Do not attempt resuscitation (DNR) -DNR-LIMITED -Do Not Intubate/DNI  Disposition: Now medically ready for discharge tomorrow, pending SNF placement  Consultants:    Procedures:    Antimicrobials:  Anti-infectives (From admission, onward)    None       Data Reviewed: I have personally reviewed following labs and imaging studies CBC: Recent Labs  Lab 07/16/24 0341 07/17/24 0825 07/18/24 0423 07/19/24 0446 07/20/24 0549  WBC 11.7* 11.0* 12.8* 13.0* 16.4*  NEUTROABS  --  8.8* 11.3* 11.4* 12.8*  HGB 15.3* 15.1* 14.2 14.4 13.5  HCT 45.1 45.4 41.8 43.2 40.0  MCV 93.6 95.4 95.0 96.6 96.6  PLT 229 257 264 311 327   Basic Metabolic Panel: Recent Labs  Lab 07/16/24 0341 07/17/24 0825 07/18/24 0423 07/19/24 0446 07/20/24 0549  NA 130* 133* 132* 134* 136  K 3.6 3.9 4.3 4.6 4.2  CL 94* 95* 93* 94* 96*  CO2 23 28 29 29 30   GLUCOSE 120* 127* 166* 169* 232*  BUN 33* 29* 29* 30* 28*  CREATININE 0.87 0.74 0.68 0.66 0.62  CALCIUM  8.5* 8.9 8.9 8.9 8.9  MG  --   --  2.6* 2.4 2.3  PHOS  --   --  4.1 3.7 3.1   GFR: Estimated Creatinine Clearance: 53.4 mL/min (by C-G formula based on SCr of 0.62 mg/dL). Liver Function Tests: Recent Labs  Lab 07/14/24 [phone] 07/17/24 0825 07/18/24 0423 07/19/24 0446 07/20/24 0549  AST 28 51* 53* 55* 40  ALT  9 27 40 52* 46*  ALKPHOS 70 78 75 74 73  BILITOT 1.4* 0.5 0.3 0.2 0.2  PROT 7.5 7.2 6.9 6.7 6.6  ALBUMIN 3.2* 2.8* 2.9* 2.9* 2.8*   CBG: No results for input(s): GLUCAP in the last 168 hours.  Recent Results (from the past 240 hours)  Resp panel by RT-PCR (RSV, Flu A&B, Covid) Anterior Nasal Swab     Status: None   Collection Time: 07/14/24  5:02 AM   Specimen: Anterior Nasal Swab  Result Value Ref Range Status   SARS Coronavirus 2 by RT PCR NEGATIVE NEGATIVE Final    Comment: (NOTE) SARS-CoV-2 target nucleic acids are NOT DETECTED.  The SARS-CoV-2 RNA is generally detectable in upper respiratory specimens during the acute phase of infection. The lowest concentration of SARS-CoV-2 viral copies this assay can detect is 138 copies/mL. A negative result does not preclude SARS-Cov-2 infection and should not be used as the sole basis for treatment or other patient management decisions. A negative result may occur with  improper specimen collection/handling, submission of specimen  other than nasopharyngeal swab, presence of viral mutation(s) within the areas targeted by this assay, and inadequate number of viral copies(<138 copies/mL). A negative result must be combined with clinical observations, patient history, and epidemiological information. The expected result is Negative.  Fact Sheet for Patients:  bloggercourse.com  Fact Sheet for Healthcare Providers:  seriousbroker.it  This test is no t yet approved or cleared by the [place]  FDA and  has been authorized for detection and/or diagnosis of SARS-CoV-2 by FDA under an Emergency Use Authorization (EUA). This EUA will remain  in effect (meaning this test can be used) for the duration of the COVID-19 declaration under Section 564(b)(1) of the Act, 21 U.S.C.section 360bbb-3(b)(1), unless the authorization is terminated  or revoked sooner.       Influenza A by PCR  NEGATIVE NEGATIVE Final   Influenza B by PCR NEGATIVE NEGATIVE Final    Comment: (NOTE) The Xpert Xpress SARS-CoV-2/FLU/RSV plus assay is intended as an aid in the diagnosis of influenza from Nasopharyngeal swab specimens and should not be used as a sole basis for treatment. Nasal washings and aspirates are unacceptable for Xpert Xpress SARS-CoV-2/FLU/RSV testing.  Fact Sheet for Patients: bloggercourse.com  Fact Sheet for Healthcare Providers: seriousbroker.it  This test is not yet approved or cleared by the [place]  FDA and has been authorized for detection and/or diagnosis of SARS-CoV-2 by FDA under an Emergency Use Authorization (EUA). This EUA will remain in effect (meaning this test can be used) for the duration of the COVID-19 declaration under Section 564(b)(1) of the Act, 21 U.S.C. section 360bbb-3(b)(1), unless the authorization is terminated or revoked.     Resp Syncytial Virus by PCR NEGATIVE NEGATIVE Final    Comment: (NOTE) Fact Sheet for Patients: bloggercourse.com  Fact Sheet for Healthcare Providers: seriousbroker.it  This test is not yet approved or cleared by the [place]  FDA and has been authorized for detection and/or diagnosis of SARS-CoV-2 by FDA under an Emergency Use Authorization (EUA). This EUA will remain in effect (meaning this test can be used) for the duration of the COVID-19 declaration under Section 564(b)(1) of the Act, 21 U.S.C. section 360bbb-3(b)(1), unless the authorization is terminated or revoked.  Performed at [id number], [address] [place], [place] [address]   Blood culture (routine x 2)     Status: None   Collection Time: 07/14/24  5:49 AM   Specimen: BLOOD  Result Value Ref Range Status   Specimen Description BLOOD BLOOD LEFT ARM  Final   Special Requests   Final    BOTTLES DRAWN AEROBIC AND ANAEROBIC  Blood Culture results may not be optimal due to an inadequate volume of blood received in culture bottles   Culture   Final    NO GROWTH 5 DAYS Performed at [facility], [address] [place], [place] [address]    Report Status 07/19/2024 FINAL  Final  Blood culture (routine x 2)     Status: None   Collection Time: 07/14/24  5:49 AM   Specimen: BLOOD  Result Value Ref Range Status   Specimen Description BLOOD BLOOD LEFT ARM  Final   Special Requests   Final    BOTTLES DRAWN AEROBIC AND ANAEROBIC Blood Culture results may not be optimal due to an inadequate volume of blood received in culture bottles   Culture   Final    NO GROWTH 5 DAYS Performed at [facility], [address] [place], [place] [address]    Report  Status 07/19/2024 FINAL  Final  Body fluid culture w Gram Stain     Status: None   Collection Time: 07/14/24  2:05 PM   Specimen: PATH Cytology Pleural fluid  Result Value Ref Range Status   Specimen Description   Final    PLEURAL Performed at [facility], [address] [place], [place] [address]    Special Requests   Final    NONE Performed at [facility], [address] [place], [place] [address]    Gram Stain   Final    FEW WBC PRESENT,BOTH PMN AND MONONUCLEAR NO ORGANISMS SEEN    Culture   Final    NO GROWTH 4 DAYS Performed at [facility], [address] [place], [place] [address]    Report Status 07/18/2024 FINAL  Final     Radiology Studies: No results found.   Scheduled Meds:  apixaban   2.5 mg Oral BID   digoxin   0.125 mg Oral Daily   DULoxetine   60 mg Oral BID   feeding supplement  237 mL Oral TID BM   fluticasone  furoate-vilanterol  1 puff Inhalation Daily   ivabradine   7.5 mg Oral BID WC   multivitamin with minerals  1 tablet Oral Daily   polyethylene glycol  17 g Oral Daily   predniSONE   50 mg Oral Daily   Continuous Infusions:   LOS: 6 days  MDM: Patient is high risk for one or  more organ failure.  They necessitate ongoing hospitalization for continued IV therapies and subsequent lab monitoring. Total time spent interpreting labs and vitals, reviewing the medical record, coordinating care amongst consultants and care team members, directly assessing and discussing care with the patient and/or family: 55 min  Sara Thorstenson, DO [facility]  To contact the attending physician between 7A-7P please use Epic Chat. To contact the covering physician during after hours 7P-7A, please review Amion.  07/20/2024, 2:53 PM   *This document has been created with the assistance of dictation software. Please excuse typographical errors. *

## 2024-07-20 NOTE — Plan of Care (Signed)

## 2024-07-21 LAB — COMPREHENSIVE METABOLIC PANEL WITH GFR
ALT: 96 U/L — ABNORMAL HIGH (ref 0–44)
AST: 70 U/L — ABNORMAL HIGH (ref 15–41)
Albumin: 2.8 g/dL — ABNORMAL LOW (ref 3.5–5.0)
Alkaline Phosphatase: 71 U/L (ref 38–126)
Anion gap: 8 (ref 5–15)
BUN: 41 mg/dL — ABNORMAL HIGH (ref 8–23)
CO2: 31 mmol/L (ref 22–32)
Calcium: 8.7 mg/dL — ABNORMAL LOW (ref 8.9–10.3)
Chloride: 99 mmol/L (ref 98–111)
Creatinine, Ser: 0.72 mg/dL (ref 0.44–1.00)
GFR, Estimated: >60 mL/min (ref >60)
Glucose, Bld: 109 mg/dL — ABNORMAL HIGH (ref 70–99)
Potassium: 4.1 mmol/L (ref 3.5–5.1)
Sodium: 138 mmol/L (ref 135–145)
Total Bilirubin: <0.2 mg/dL (ref 0.0–1.2)
Total Protein: 6.4 g/dL — ABNORMAL LOW (ref 6.5–8.1)

## 2024-07-21 LAB — CBC WITH DIFFERENTIAL/PLATELET
Abs Immature Granulocytes: 1.37 K/uL — ABNORMAL HIGH (ref 0.00–0.07)
Basophils Absolute: 0.1 K/uL (ref 0.0–0.1)
Basophils Relative: 1 %
Eosinophils Absolute: 0.2 K/uL (ref 0.0–0.5)
Eosinophils Relative: 1 %
HCT: 42 % (ref 36.0–46.0)
Hemoglobin: 13.7 g/dL (ref 12.0–15.0)
Immature Granulocytes: 10 %
Lymphocytes Relative: 14 %
Lymphs Abs: 2 K/uL (ref 0.7–4.0)
MCH: 32.2 pg (ref 26.0–34.0)
MCHC: 32.6 g/dL (ref 30.0–36.0)
MCV: 98.6 fL (ref 80.0–100.0)
Monocytes Absolute: 0.9 K/uL (ref 0.1–1.0)
Monocytes Relative: 6 %
Neutro Abs: 9.9 K/uL — ABNORMAL HIGH (ref 1.7–7.7)
Neutrophils Relative %: 68 %
Platelets: 323 K/uL (ref 150–400)
RBC: 4.26 MIL/uL (ref 3.87–5.11)
RDW: 13.5 % (ref 11.5–15.5)
WBC: 14.5 K/uL — ABNORMAL HIGH (ref 4.0–10.5)
nRBC: 0 % (ref 0.0–0.2)

## 2024-07-21 LAB — PHOSPHORUS: Phosphorus: 3.4 mg/dL (ref 2.5–4.6)

## 2024-07-21 LAB — MAGNESIUM: Magnesium: 2.5 mg/dL — ABNORMAL HIGH (ref 1.7–2.4)

## 2024-07-21 NOTE — Progress Notes (Signed)
 Physical Therapy Treatment Patient Details Name: Sara [name] MRN: [id number] DOB: [birth year] Today's Date: 07/21/2024   History of Present Illness Sara [name] is a 73 y.o. 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  Presented to ER secondary to persistent cough, SOB; admitted for management of acute hypoxic respiratory failure secondary to bilat pleural effusion, sarcoidosis.  Hospital course significant for thoracentesis (11/24) with removed.    PT Comments  I want to go home  she agrees to session but generally fatigued throughout.  She is able to get to EOB and transfer to [facility] with cga x 1.  Upon return to EOB she is able to don prosthesis and walk to door and back with RW and cga/min a x 1 for safety.  Opts to return to bed and is too fatigued to do more.  Qualifying sats documented in separate note.  She continued to need O2 support for mobility with sats at 2 lpm 90% at rest.  She did not wear O2 at baseline.  She needs min assist throughout session to manage O2 cording and general awareness as she often turns and gets tangled in cording.  Pt stated she has no outside assist besides [facility] agencies.  Pt remains with general deconditioning and limited activity tolerance.  She stated she has all DME at home and will use wheelchair for mobility in the home if needed. Will likely refuse rehab.   If plan is discharge home, recommend the following: A little help with walking and/or transfers;A little help with bathing/dressing/bathroom;Help with stairs or ramp for entrance;Assistance with cooking/housework;Assist for transportation   Can travel by private vehicle        Equipment Recommendations       Recommendations for Other Services       Precautions / Restrictions Precautions Precautions:  Fall Recall of Precautions/Restrictions: Intact Precaution/Restrictions Comments: R AKA Restrictions Weight Bearing Restrictions Per Provider Order: No     Mobility  Bed Mobility Overal bed mobility: Needs Assistance Bed Mobility: Supine to Sit, Sit to Supine     Supine to sit: Modified independent (Device/Increase time) Sit to supine: Modified independent (Device/Increase time)     Patient Response: Cooperative  Transfers Overall transfer level: Needs assistance Equipment used: Rolling walker (2 wheels), None Transfers: Sit to/from Stand, Bed to chair/wheelchair/BSC Sit to Stand: Contact guard assist     Squat pivot transfers: Contact guard assist, Supervision          Ambulation/Gait Ambulation/Gait assistance: Contact guard assist, Min assist Gait Distance (Feet): 25 Feet Assistive device: Rolling walker (2 wheels) Gait Pattern/deviations: Step-through pattern, Decreased step length - right, Decreased step length - left Gait velocity: dec     General Gait Details: limited by general fatigue   Stairs             Wheelchair Mobility     Tilt Bed Tilt Bed Patient Response: Cooperative  Modified Rankin (Stroke Patients Only)       Balance Overall balance assessment: Needs assistance, Mild deficits observed, not formally tested Sitting-balance support: No upper extremity supported, Feet supported Sitting balance-Leahy Scale: Good     Standing balance support: Bilateral upper extremity supported Standing balance-Leahy Scale: Fair  Communication Communication Communication: No apparent difficulties  Cognition Arousal: Alert Behavior During Therapy: WFL for tasks assessed/performed, Flat affect   PT - Cognitive impairments: No apparent impairments, Safety/Judgement                       PT - Cognition Comments: generally soft spoken, flat affect Following commands: Intact      Cueing  Cueing Techniques: Verbal cues  Exercises      General Comments        Pertinent Vitals/Pain Pain Assessment Pain Assessment: No/denies pain Pain Intervention(s): Monitored during session    Home Living                          Prior Function            PT Goals (current goals can now be found in the care plan section) Progress towards PT goals: Progressing toward goals    Frequency    Min 2X/week      PT Plan      Co-evaluation              AM-PAC PT 6 Clicks Mobility   Outcome Measure  Help needed turning from your back to your side while in a flat bed without using bedrails?: None Help needed moving from lying on your back to sitting on the side of a flat bed without using bedrails?: None Help needed moving to and from a bed to a chair (including a wheelchair)?: A Little Help needed standing up from a chair using your arms (e.g., wheelchair or bedside chair)?: A Little Help needed to walk in hospital room?: A Little Help needed climbing 3-5 steps with a railing? : A Lot 6 Click Score: 19    End of Session Equipment Utilized During Treatment: Gait belt;Oxygen Activity Tolerance: Patient tolerated treatment well;Patient limited by fatigue Patient left: in chair;with call bell/phone within reach;with chair alarm set Nurse Communication: Mobility status PT Visit Diagnosis: Muscle weakness (generalized) (M62.81);Difficulty in walking, not elsewhere classified (R26.2)     Time: 9057-9042 PT Time Calculation (min) (ACUTE ONLY): 15 min  Charges:    $Gait Training: 8-22 mins PT General Charges $$ ACUTE PT VISIT: 1 Visit                   Lauraine Gills, PTA 07/21/24, 10:08 AM

## 2024-07-21 NOTE — Progress Notes (Signed)
 Daily Progress Note   Patient Name: Sara [name]       Date: 07/21/2024 DOB: Jan 15, 1951  Age: 73 y.o. MRN#: [id number] Attending Physician: Leesa Kast, DO Primary Care Physician: [facility]mit Date: 07/14/2024  Reason for Consultation/Follow-up: Establishing goals of care  Subjective: Notes and labs reviewed.  In to see patient.  She is currently resting in bed at this time watching television; no distress noted.  No family at bedside.  She states she is aware of plans for rehab and is amenable to this plan.  We discussed her IS that is at bedside.  She states she has not been using this.  Reviewed the importance of I-S use and educated on how to use the device.  Patient is able to pull 500 at this time.  Discussed working towards at least 1,000.  Patient has no further questions or concerns at this time.  Length of Stay: 7  Current Medications: Scheduled Meds:   apixaban   2.5 mg Oral BID   digoxin   0.125 mg Oral Daily   DULoxetine   60 mg Oral BID   feeding supplement  237 mL Oral TID BM   fluticasone  furoate-vilanterol  1 puff Inhalation Daily   ivabradine   7.5 mg Oral BID WC   multivitamin with minerals  1 tablet Oral Daily   polyethylene glycol  17 g Oral Daily    Continuous Infusions:   PRN Meds: acetaminophen  **OR** acetaminophen , bisacodyl , ipratropium-albuterol , melatonin, ondansetron  **OR** ondansetron  (ZOFRAN ) IV  Physical Exam Pulmonary:     Effort: Pulmonary effort is normal.  Skin:    General: Skin is warm and dry.  Neurological:     Mental Status: She is alert.             Vital Signs: BP (!) 106/58 (BP Location: Left Arm)   Pulse 79   Temp 97.9 F (36.6 C)   Resp 16   Wt 59.6 kg   SpO2 91%   BMI 23.28 kg/m  SpO2:  SpO2: 91 % O2 Device: O2 Device: Nasal Cannula O2 Flow Rate: O2 Flow Rate (L/min): 2 L/min  Intake/output summary:  Intake/Output Summary (Last 24 hours) at 07/21/2024 1544 Last data filed at 07/20/2024 1917 Gross per 24 hour  Intake 0 ml  Output --  Net 0 ml   LBM:  Last BM Date : 08/18/24 Baseline Weight: Weight: 59.6 kg Most recent weight: Weight: 59.6 kg   Patient Active Problem List   Diagnosis Date Noted   Malnutrition of moderate degree 07/16/2024   Pleural effusion 07/14/2024   Moderate episode of recurrent major depressive disorder (HCC) 06/16/2024   Hand pain, not arthralgia, right 05/16/2024   Right leg pain 12/26/2023   Concussion 12/19/2023   Sleep disorder 12/19/2023   At risk for falling 12/19/2023   At risk for polypharmacy 12/19/2023   Insomnia 12/19/2023   Traumatic ecchymosis 12/19/2023   TIA (transient ischemic attack) 12/12/2023   S/P insertion of spinal cord stimulator 10/08/2023   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) 08/03/2023   Neuropathic pain 05/22/2023   Elevated liver enzymes 01/01/2023   Prediabetes 01/01/2023   Polyp of descending colon 11/13/2022   Sinus tachycardia 10/24/2022   Inappropriate sinus tachycardia 10/24/2022   Thoracic facet joint syndrome 06/08/2022   Bone lesion 06/08/2022   IgA monoclonal gammopathy 05/18/2022   Degeneration of lumbar intervertebral disc 01/04/2022   Shoulder pain 01/04/2022   Pain in joint of right shoulder 01/04/2022   Spasm of back muscles 01/04/2022   Thoracic back pain 01/04/2022   History of left above knee amputation [facility]) (Dec 2021) 12/27/2021   Rotator cuff arthropathy of right shoulder 12/27/2021   Hx of rotator cuff surgery 12/27/2021   Chronic pain syndrome 12/27/2021   Adhesive capsulitis of right shoulder 12/05/2021   Phantom limb pain (HCC) 11/10/2021   Muscle spasm 09/09/2020   S/P AKA (above knee amputation), right (HCC) 09/08/2020   Ischemia of extremity 07/02/2020    Chronic pain in right shoulder 11/25/2019   Encounter for long-term (current) use of high-risk medication 07/16/2019   GCA (giant cell arteritis) (HCC) 07/16/2019   Temporal arteritis (HCC) 07/13/2019   Postoperative wound infection 06/23/2019   Ischemic leg 05/31/2019   Atherosclerosis of native arteries of extremity with intermittent claudication 05/30/2019   Depression 05/29/2019   Eczema of lower extremity 03/04/2018   Chronic venous insufficiency 03/02/2018   History of colonic polyps    Family history of colonic polyps    Benign neoplasm of ascending colon    Mitral valve insufficiency 02/08/2017   Dyspnea on exertion 02/07/2017   Non-rheumatic mitral regurgitation 02/07/2017   Precordial pain 02/07/2017   CAD (coronary artery disease) 12/21/2016   Primary hypertension 12/21/2016   Compression fracture of lumbar spine, non-traumatic (HCC) 04/26/2016   Compression fracture of L3 vertebra (HCC) 04/20/2016   PVD (peripheral vascular disease) 02/24/2016   Family history of premature CAD 02/24/2016   Other specified disease of esophagus    Hiatal hernia    Ischemia of lower extremity    Arterial occlusion (HCC)    Atherosclerosis of aorta (HCC)    History of smoking    Hyperlipidemia    Pain in the chest    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 02/05/2016   Carotid artery stenosis 01/04/2016   Clinical depression 09/06/2015   Angiopathy, peripheral 09/06/2015   Atherosclerosis of native arteries of extremity with rest pain (HCC) 09/06/2015   Chronic recurrent major depressive disorder 03/09/2015   Peripheral blood vessel disorder 03/09/2015   GERD (gastroesophageal reflux disease) 12/21/2014   Carotid artery narrowing 01/28/2014   Peripheral arterial occlusive disease 06/08/2011   Occlusion and stenosis of unspecified carotid artery 06/08/2011   PAD (peripheral artery disease) 06/08/2011    Palliative Care Assessment & Plan  Recommendations/Plan: DNR/DNI Awaiting SNF rehab placement     Code Status Orders  (From admission, onward)           Start     Ordered   07/14/24 0926  Do not attempt resuscitation (DNR)- Limited -Do Not Intubate (DNI)  (Code Status)  Continuous       Question Answer Comment  If pulseless and not breathing No CPR or chest compressions.   In Pre-Arrest Conditions (Patient Is Breathing and Has A Pulse) Do not intubate. Provide all appropriate non-invasive medical interventions. Avoid ICU transfer unless indicated or required.   Consent: Discussion documented in EHR or advanced directives reviewed      07/14/24 0925           Code Status History     Date Active Date Inactive Code Status Order ID Comments User Context   07/14/2024 0901 07/14/2024 0925 Full Code [id number]  Lenon Marien CROME, MD ED   12/19/2023 1518 12/20/2023 1653 Full Code [id number]  Cox, Amy LOISE, DO ED   12/12/2023 1943 12/12/2023 1947 Full Code [id number]  Cleatus Delayne GAILS, MD ED   08/11/2020 2035 08/16/2020 2138 Full Code [id number]  Schnier, Cordella MATSU, MD Inpatient   07/02/2020 1520 07/04/2020 1846 Full Code [id number]  Hildegarde Suzen LABOR, PA-C Inpatient   05/30/2019 1435 06/07/2019 1956 Full Code [id number]  Jama Cordella MATSU, MD Inpatient   02/09/2016 1813 02/15/2016 1849 Full Code [id number]  Jama Cordella MATSU, MD Inpatient   02/05/2016 1713 02/09/2016 1813 Full Code [id number]  Schnier, Cordella MATSU, MD ED     Camelia Lewis, NP  Please contact Palliative Medicine Team phone at [phone] for questions and concerns.

## 2024-07-21 NOTE — [place] FL2 (Signed)
   MEDICAID FL2 LEVEL OF CARE FORM     IDENTIFICATION  Patient Name: Sara [name] Birthdate: June 09, 1951 Sex: female Admission Date (Current Location): 07/14/2024  [facility] and [place] Number:  Chiropodist and Address:  [facility], [address], [place], [place] [address]      Provider Number: [id number]  Attending Physician Name and Address:  Leesa Kast, DO  Relative Name and Phone Number:  Sheilla Maris [phone]    Current Level of Care: Hospital Recommended Level of Care: Skilled Nursing Facility Prior Approval Number:    Date Approved/Denied:   PASRR Number: [id number]  Discharge Plan: SNF    Current Diagnoses: Patient Active Problem List   Diagnosis Date Noted   Malnutrition of moderate degree 07/16/2024   Pleural effusion 07/14/2024   Moderate episode of recurrent major depressive disorder (HCC) 06/16/2024   Hand pain, not arthralgia, right 05/16/2024   Right leg pain 12/26/2023   Concussion 12/19/2023   Sleep disorder 12/19/2023   At risk for falling 12/19/2023   At risk for polypharmacy 12/19/2023   Insomnia 12/19/2023   Traumatic ecchymosis 12/19/2023   TIA (transient ischemic attack) 12/12/2023   S/P insertion of spinal cord stimulator 10/08/2023   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) 08/03/2023   Neuropathic pain 05/22/2023   Elevated liver enzymes 01/01/2023   Prediabetes 01/01/2023   Polyp of descending colon 11/13/2022   Sinus tachycardia 10/24/2022   Inappropriate sinus tachycardia 10/24/2022   Thoracic facet joint syndrome 06/08/2022   Bone lesion 06/08/2022   IgA monoclonal gammopathy 05/18/2022   Degeneration of lumbar intervertebral disc 01/04/2022   Shoulder pain 01/04/2022   Pain in joint of right shoulder 01/04/2022   Spasm of back muscles 01/04/2022   Thoracic back pain 01/04/2022   History of left above knee amputation [facility]) (Dec 2021) 12/27/2021   Rotator  cuff arthropathy of right shoulder 12/27/2021   Hx of rotator cuff surgery 12/27/2021   Chronic pain syndrome 12/27/2021   Adhesive capsulitis of right shoulder 12/05/2021   Phantom limb pain (HCC) 11/10/2021   Muscle spasm 09/09/2020   S/P AKA (above knee amputation), right (HCC) 09/08/2020   Ischemia of extremity 07/02/2020   Chronic pain in right shoulder 11/25/2019   Encounter for long-term (current) use of high-risk medication 07/16/2019   GCA (giant cell arteritis) (HCC) 07/16/2019   Temporal arteritis (HCC) 07/13/2019   Postoperative wound infection 06/23/2019   Ischemic leg 05/31/2019   Atherosclerosis of native arteries of extremity with intermittent claudication 05/30/2019   Depression 05/29/2019   Eczema of lower extremity 03/04/2018   Chronic venous insufficiency 03/02/2018   History of colonic polyps    Family history of colonic polyps    Benign neoplasm of ascending colon    Mitral valve insufficiency 02/08/2017   Dyspnea on exertion 02/07/2017   Non-rheumatic mitral regurgitation 02/07/2017   Precordial pain 02/07/2017   CAD (coronary artery disease) 12/21/2016   Primary hypertension 12/21/2016   Compression fracture of lumbar spine, non-traumatic (HCC) 04/26/2016   Compression fracture of L3 vertebra (HCC) 04/20/2016   PVD (peripheral vascular disease) 02/24/2016   Family history of premature CAD 02/24/2016   Other specified disease of esophagus    Hiatal hernia    Ischemia of lower extremity    Arterial occlusion (HCC)    Atherosclerosis of aorta (HCC)    History of smoking    Hyperlipidemia    Pain in the chest    Nontraumatic ischemic infarction of  muscle of right lower leg 02/05/2016   Carotid artery stenosis 01/04/2016   Clinical depression 09/06/2015   Angiopathy, peripheral 09/06/2015   Atherosclerosis of native arteries of extremity with rest pain (HCC) 09/06/2015   Chronic recurrent major depressive disorder 03/09/2015   Peripheral blood vessel  disorder 03/09/2015   GERD (gastroesophageal reflux disease) 12/21/2014   Carotid artery narrowing 01/28/2014   Peripheral arterial occlusive disease 06/08/2011   Occlusion and stenosis of unspecified carotid artery 06/08/2011   PAD (peripheral artery disease) 06/08/2011    Orientation RESPIRATION BLADDER Height & Weight     Self, Time, Situation, Place  Normal Continent Weight: 59.6 kg Height:     BEHAVIORAL SYMPTOMS/MOOD NEUROLOGICAL BOWEL NUTRITION STATUS      Continent Diet (DYS 3, Please send Meats MINCED w/ Gravies. Gravy on potatoes too. May have baked/sweet potato per Speech ok w/ butters. NO STRAWS!!!)  AMBULATORY STATUS COMMUNICATION OF NEEDS Skin   Limited Assist Verbally Normal                       Personal Care Assistance Level of Assistance  Bathing, Dressing Bathing Assistance: Limited assistance   Dressing Assistance: Limited assistance     Functional Limitations Info  Sight Sight Info: Impaired (wears glasses)        SPECIAL CARE FACTORS FREQUENCY  PT (By licensed PT), OT (By licensed OT)     PT Frequency: 5x week OT Frequency: 5x week            Contractures Contractures Info: Not present    Additional Factors Info  Code Status, Allergies Code Status Info: DNR- Limited Allergies Info: Zolpidem            Current Medications (07/21/2024):  This is the current hospital active medication list Current Facility-Administered Medications  Medication Dose Route Frequency Provider Last Rate Last Admin   acetaminophen  (TYLENOL ) tablet 650 mg  650 mg Oral Q6H PRN Lenon Marien CROME, MD   650 mg at 07/18/24 0741   Or   acetaminophen  (TYLENOL ) suppository 650 mg  650 mg Rectal Q6H PRN Lenon Marien CROME, MD       apixaban  (ELIQUIS ) tablet 2.5 mg  2.5 mg Oral BID Maree Hue, MD   2.5 mg at 07/21/24 9176   bisacodyl  (DULCOLAX) EC tablet 5 mg  5 mg Oral Daily PRN Anderson, Chelsey L, MD   5 mg at 07/18/24 9262   digoxin  (LANOXIN ) tablet 0.125 mg   0.125 mg Oral Daily Lenon Marien CROME, MD   0.125 mg at 07/21/24 0831   DULoxetine  (CYMBALTA ) DR capsule 60 mg  60 mg Oral BID Lenon Marien CROME, MD   60 mg at 07/21/24 9176   feeding supplement (ENSURE PLUS HIGH PROTEIN) liquid 237 mL  237 mL Oral TID BM Maree Hue, MD   237 mL at 07/21/24 0828   fluticasone  furoate-vilanterol (BREO ELLIPTA ) 100-25 MCG/ACT 1 puff  1 puff Inhalation Daily Fernand Rima A, MD   1 puff at 07/21/24 0828   ipratropium-albuterol  (DUONEB) 0.5-2.5 (3) MG/3ML nebulizer solution 3 mL  3 mL Nebulization Q4H PRN Dezii, Alexandra, DO       ivabradine  (CORLANOR ) tablet 7.5 mg  7.5 mg Oral BID WC Lenon Marien L, MD   7.5 mg at 07/21/24 0824   melatonin tablet 5 mg  5 mg Oral QHS PRN Duncan, Hazel V, MD   5 mg at 07/18/24 2345   multivitamin with minerals tablet 1 tablet  1 tablet Oral  Daily Maree Hue, MD   1 tablet at 07/21/24 9176   ondansetron  (ZOFRAN ) tablet 4 mg  4 mg Oral Q6H PRN Lenon Marien CROME, MD       Or   ondansetron  (ZOFRAN ) injection 4 mg  4 mg Intravenous Q6H PRN Anderson, Chelsey L, MD       polyethylene glycol (MIRALAX  / GLYCOLAX ) packet 17 g  17 g Oral Daily Lenon Marien CROME, MD   17 g at 07/21/24 9171     Discharge Medications: Please see discharge summary for a list of discharge medications.  Relevant Imaging Results:  Relevant Lab Results:   Additional Information SS# [id number]  Daved JONETTA Hamilton, RN

## 2024-07-21 NOTE — Progress Notes (Signed)
 SaO2 on room air at rest = 89% SaO2 on room air while ambulating = 87% SaO2 on 2 liters of O2 while ambulating = 90%

## 2024-07-21 NOTE — Progress Notes (Signed)
 Occupational Therapy Treatment Patient Details Name: Sara [name] MRN: [id number] DOB: 1950-12-05 Today's Date: 07/21/2024   History of present illness Sara [name] is a 73 y.o. 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  Presented to ER secondary to persistent cough, SOB; admitted for management of acute hypoxic respiratory failure secondary to bilat pleural effusion, sarcoidosis.  Hospital course significant for thoracentesis (11/24) with removed.   OT comments  Patient seen for OT treatment on this date. Upon arrival to room patient resting in bed, nasal cannula on forehead and patient unaware; instructed on importance of complying with supplemental O2 as patient desatting to 85% on RA when OT checked, patient states understanding Patient with some confusion about events of this morning, OT had attempted to see patient 2 other times this morning and patient was unaware of that; patient now agreeable to SNF. While sitting EOB, OT instructed on HEP for diaphragmatic breathing with fair tolerance, see below for list of exercises. Patient transferred to recliner with poor body mechanics, required CGA and A to manage O2 tubing. Patient ended treatment in recliner with bed/chair alarm on and all needs within reach. Patient making good progress toward goals, will continue to follow POC. Discharge recommendation remains appropriate.        If plan is discharge home, recommend the following:  A lot of help with walking and/or transfers;A lot of help with bathing/dressing/bathroom;Direct supervision/assist for medications management;Direct supervision/assist for financial management;Assist for transportation;Help with stairs or ramp for entrance   Equipment Recommendations  BSC/3in1    Recommendations for Other  Services      Precautions / Restrictions Precautions Precautions: Fall Recall of Precautions/Restrictions: Intact Precaution/Restrictions Comments: R AKA Restrictions Weight Bearing Restrictions Per Provider Order: No       Mobility Bed Mobility Overal bed mobility: Modified Independent Bed Mobility: Supine to Sit     Supine to sit: Modified independent (Device/Increase time)          Transfers Overall transfer level: Needs assistance Equipment used: 1 person hand held assist Transfers: Bed to chair/wheelchair/BSC   Stand pivot transfers: Contact guard assist, Min assist               Balance Overall balance assessment: Needs assistance, Mild deficits observed, not formally tested Sitting-balance support: No upper extremity supported, Feet supported Sitting balance-Leahy Scale: Good Sitting balance - Comments: close sup for optimal safety                                   ADL either performed or assessed with clinical judgement   ADL Overall ADL's : Needs assistance/impaired                     Lower Body Dressing: Contact guard assist;Sitting/lateral leans Lower Body Dressing Details (indicate cue type and reason): able to don L sock from EOB             Functional mobility during ADLs: Contact guard assist General ADL Comments: SPT going to the R from EOB to recliner    Extremity/Trunk Assessment Upper Extremity Assessment Upper Extremity Assessment: Generalized weakness   Lower Extremity Assessment Lower Extremity Assessment: Defer to PT evaluation        Vision  Perception     Praxis     Communication Communication Communication: No apparent difficulties   Cognition Arousal: Alert Behavior During Therapy: WFL for tasks assessed/performed, Flat affect                                 Following commands: Intact        Cueing   Cueing Techniques: Verbal cues  Exercises Exercises: Other  exercises Other Exercises Other Exercises: chest expansion HEP to improve diaphragmatic breathing: 5 reps of each performed EOB: neck flexion/extension, neck rotation, AAROM shoulder flexion, thoracic twist, scapular retraction and deep breathing with fair tolerance    Shoulder Instructions       General Comments      Pertinent Vitals/ Pain       Pain Assessment Pain Assessment: No/denies pain Pain Intervention(s): Monitored during session  Home Living                                          Prior Functioning/Environment              Frequency  Min 2X/week        Progress Toward Goals  OT Goals(current goals can now be found in the care plan section)  Progress towards OT goals: Progressing toward goals  Acute Rehab OT Goals Patient Stated Goal: to get stronger OT Goal Formulation: With patient Time For Goal Achievement: 07/30/24 Potential to Achieve Goals: Good ADL Goals Pt Will Perform Grooming: with modified independence;sitting Pt Will Perform Lower Body Dressing: with modified independence;sit to/from stand Pt Will Transfer to Toilet: with modified independence;bedside commode  Plan      Co-evaluation                 AM-PAC OT 6 Clicks Daily Activity     Outcome Measure   Help from another person eating meals?: A Little Help from another person taking care of personal grooming?: A Little Help from another person toileting, which includes using toliet, bedpan, or urinal?: Total Help from another person bathing (including washing, rinsing, drying)?: A Lot Help from another person to put on and taking off regular upper body clothing?: A Little Help from another person to put on and taking off regular lower body clothing?: A Lot 6 Click Score: 14    End of Session Equipment Utilized During Treatment: Oxygen  OT Visit Diagnosis: Muscle weakness (generalized) (M62.81);Unsteadiness on feet (R26.81)   Activity Tolerance  Patient limited by fatigue   Patient Left in chair;with call bell/phone within reach;with chair alarm set   Nurse Communication          Time: 8891-8876 OT Time Calculation (min): 15 min  Charges: OT General Charges $OT Visit: 1 Visit OT Treatments $Therapeutic Exercise: 8-22 mins  Rogers Clause, OT/L MSOT, 07/21/2024

## 2024-07-21 NOTE — TOC Initial Note (Addendum)
 Transition of Care [facility]) - Initial/Assessment Note    Patient Details  Name: Sara [name] MRN: [id number] Date of Birth: [birth year]  Transition of Care [facility]) CM/SW Contact:    Daved JONETTA Hamilton, RN Phone Number: 07/21/2024, 1:53 PM  Clinical Narrative:                  Met with patient, introduced self, explained role. Prior to this hospital encounter patient is independent, lives alone in a first floor apartment, and drives herself wherever she needs to go.   Discussed with patient that therapy is recommending SNF-STR once patient is medically ready for discharge. Patient verbalized understanding and agreement to this. Patient verbalized she does not have a preference of facility.   Existing PASRR [id number]  FL2 sent for signature Facility search initiated in HUB  Patient is NOT service connected. 2nd insurance is [employer] Choice PPO    Expected Discharge Plan: Skilled Nursing Facility Barriers to Discharge: Continued Medical Work up   Patient Goals and CMS Choice     Choice offered to / list presented to : Patient      Expected Discharge Plan and Services   Discharge Planning Services: CM Consult Post Acute Care Choice: Skilled Nursing Facility Living arrangements for the past 2 months: Apartment (lives on 1st floor)                                      Prior Living Arrangements/Services Living arrangements for the past 2 months: Apartment (lives on 1st floor) Lives with:: Self Patient language and need for interpreter reviewed:: Yes Do you feel safe going back to the place where you live?: Yes      Need for Family Participation in Patient Care: No (Comment) Care giver support system in place?: No (comment)   Criminal Activity/Legal Involvement Pertinent to Current Situation/Hospitalization: No - Comment as needed  Activities of Daily Living      Permission Sought/Granted Permission sought to share information with : Case Manager,  Magazine Features Editor Permission granted to share information with : Yes, Verbal Permission Granted              Emotional Assessment Appearance:: Appears stated age, Well-Groomed Attitude/Demeanor/Rapport: Engaged Affect (typically observed): Appropriate Orientation: : Oriented to Self, Oriented to Place, Oriented to  Time, Oriented to Situation Alcohol / Substance Use: Not Applicable Psych Involvement: No (comment)  Admission diagnosis:  Pleural effusion [J90] Tachycardia [R00.0] Acute respiratory failure with hypoxia (HCC) [J96.01] Patient Active Problem List   Diagnosis Date Noted   Malnutrition of moderate degree 07/16/2024   Pleural effusion 07/14/2024   Moderate episode of recurrent major depressive disorder (HCC) 06/16/2024   Hand pain, not arthralgia, right 05/16/2024   Right leg pain 12/26/2023   Concussion 12/19/2023   Sleep disorder 12/19/2023   At risk for falling 12/19/2023   At risk for polypharmacy 12/19/2023   Insomnia 12/19/2023   Traumatic ecchymosis 12/19/2023   TIA (transient ischemic attack) 12/12/2023   S/P insertion of spinal cord stimulator 10/08/2023   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) 08/03/2023   Neuropathic pain 05/22/2023   Elevated liver enzymes 01/01/2023   Prediabetes 01/01/2023   Polyp of descending colon 11/13/2022   Sinus tachycardia 10/24/2022   Inappropriate sinus tachycardia 10/24/2022   Thoracic facet joint syndrome 06/08/2022   Bone lesion 06/08/2022   IgA monoclonal gammopathy 05/18/2022   Degeneration of  lumbar intervertebral disc 01/04/2022   Shoulder pain 01/04/2022   Pain in joint of right shoulder 01/04/2022   Spasm of back muscles 01/04/2022   Thoracic back pain 01/04/2022   History of left above knee amputation [facility]) (Dec 2021) 12/27/2021   Rotator cuff arthropathy of right shoulder 12/27/2021   Hx of rotator cuff surgery 12/27/2021   Chronic pain syndrome 12/27/2021   Adhesive capsulitis  of right shoulder 12/05/2021   Phantom limb pain (HCC) 11/10/2021   Muscle spasm 09/09/2020   S/P AKA (above knee amputation), right (HCC) 09/08/2020   Ischemia of extremity 07/02/2020   Chronic pain in right shoulder 11/25/2019   Encounter for long-term (current) use of high-risk medication 07/16/2019   GCA (giant cell arteritis) (HCC) 07/16/2019   Temporal arteritis (HCC) 07/13/2019   Postoperative wound infection 06/23/2019   Ischemic leg 05/31/2019   Atherosclerosis of native arteries of extremity with intermittent claudication 05/30/2019   Depression 05/29/2019   Eczema of lower extremity 03/04/2018   Chronic venous insufficiency 03/02/2018   History of colonic polyps    Family history of colonic polyps    Benign neoplasm of ascending colon    Mitral valve insufficiency 02/08/2017   Dyspnea on exertion 02/07/2017   Non-rheumatic mitral regurgitation 02/07/2017   Precordial pain 02/07/2017   CAD (coronary artery disease) 12/21/2016   Primary hypertension 12/21/2016   Compression fracture of lumbar spine, non-traumatic (HCC) 04/26/2016   Compression fracture of L3 vertebra (HCC) 04/20/2016   PVD (peripheral vascular disease) 02/24/2016   Family history of premature CAD 02/24/2016   Other specified disease of esophagus    Hiatal hernia    Ischemia of lower extremity    Arterial occlusion (HCC)    Atherosclerosis of aorta (HCC)    History of smoking    Hyperlipidemia    Pain in the chest    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 02/05/2016   Carotid artery stenosis 01/04/2016   Clinical depression 09/06/2015   Angiopathy, peripheral 09/06/2015   Atherosclerosis of native arteries of extremity with rest pain (HCC) 09/06/2015   Chronic recurrent major depressive disorder 03/09/2015   Peripheral blood vessel disorder 03/09/2015   GERD (gastroesophageal reflux disease) 12/21/2014   Carotid artery narrowing 01/28/2014   Peripheral arterial occlusive  disease 06/08/2011   Occlusion and stenosis of unspecified carotid artery 06/08/2011   PAD (peripheral artery disease) 06/08/2011   PCP:  [facility]:   [facility] [place], [place] - [address] [place] [address] Phone: [phone] Fax: [phone]     Social Drivers of Health (SDOH) Social History: SDOH Screenings   Food Insecurity: No Food Insecurity (07/14/2024)  Housing: Low Risk  (07/14/2024)  Transportation Needs: No Transportation Needs (07/14/2024)  Utilities: Not At Risk (07/14/2024)  Depression (PHQ2-9): Low Risk  (05/22/2023)  Financial Resource Strain: Patient Declined (07/16/2023)   Received from [facility]  Social Connections: Socially Isolated (07/14/2024)  Tobacco Use: Medium Risk (06/16/2024)   SDOH Interventions:     Readmission Risk Interventions     No data to display

## 2024-07-21 NOTE — Progress Notes (Signed)
 PROGRESS NOTE    Sara [name]  FMW:[id number] DOB: 1951-02-17 DOA: 07/14/2024 PCP: [facility]  Chief Complaint  Patient presents with   Shortness of Breath    Hospital Course:  Sara [name] is a 73 year old female with GERD, PAD s/p right AKA, carotid artery stenosis, hyperlipidemia, CAD, PVD, hypertension, CVA, 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  On 11/25 she underwent IR guided left thoracentesis.  Pulmonology was also consulted. Patient was started on prednisone  and began to wean from high flow nasal cannula.  Still very dyspneic and moving very slowly with therapy services. Now awaiting SNF placement.   Subjective: Patient continues to be very weak today.  She initially refused skilled nursing facility when discussing with occupational therapy.  Upon our discussion she has changed her mind.  She endorses that she is too weak to go home and be safe.  Objective: Vitals:   07/20/24 1646 07/20/24 2019 07/21/24 0441 07/21/24 0831  BP: (!) 120/59 (!) 118/54 (!) 120/54 (!) 106/58  Pulse: 80 86 78 79  Resp: 14 18 18 16   Temp: 98.3 F (36.8 C) 98.2 F (36.8 C) 98 F (36.7 C) 97.9 F (36.6 C)  TempSrc: Oral Oral Oral   SpO2: 93% 93% 91% 91%  Weight:        Intake/Output Summary (Last 24 hours) at 07/21/2024 1233 Last data filed at 07/20/2024 1917 Gross per 24 hour  Intake 120 ml  Output --  Net 120 ml   Filed Weights   07/14/24 0411  Weight: 59.6 kg    Examination: General exam: Appears calm and comfortable, NAD, appears frail Respiratory system: Improvement in aeration bilaterally, 2L [place] in place    Cardiovascular system: S1 & S2 heard, RRR.  Gastrointestinal system: Abdomen is nondistended, soft and nontender.  Neuro: Alert and oriented. No focal neurological deficits. Extremities: Symmetric, expected  ROM Skin: No rashes, lesions Psychiatry: Mood & affect appropriate for situation.   Assessment & Plan:  Principal Problem:   Pleural effusion Active Problems:   Malnutrition of moderate degree    Hypoxic respiratory failure Bilateral pleural effusions - This is multifactorial due to sarcoidosis, and likely component of COPD - Has successfully weaned to 2 L now - May require oxygen at baseline though with continued therapy at SNF she may able to wean entirely before discharging home. - Suspect much of her O2 requirement now is due to shallow respirations and atelectasis as she is very deconditioned. - Continue aggressive PT/OT, pending SNF placement.  TOC is current with patient.  Bed search pending - Initial effusions on arrival left greater than right.  No PE on CTA - Status post diagnostic and therapeutic thoracentesis 11/25.  Gram stain negative, culture negative.  LD very elevated, cyto path negative.  Clinical picture most consistent with Sarcoid Associated pulmonary effusions - Received 5 total days Methylpred/prednisone : 11/27 through 12/1 - No infectious signs or symptoms - Negative RVP - Continue with breathing treatments - Added Breo Ellipta  11/26 - Pulmonology consulted, appreciate recommendations - Status post IV diuresis - Echo with EF 60 to 65%, no regional wall motion abnormalities, LV diastolic parameters normal.  No significant valvular abnormalities  Sarcoidosis - Pulmonary consult to Dr. Fernand - On [place], as needed DuoNebs - Patient reports prior to this she was mostly asymptomatic - s/p 5 days steroid therapy.  Significant improvement since initiation  History of  low back pain Limb pain - Continue home dose duloxetine  and gabapentin  - Spinal cord stimulator in place - PT/OT recommending SNF.  TOC aware.  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 - Continues to improve with diuretic  use - Continue to trend CMP  Sinus tachycardia, chronic - Resolved.  Continue home digoxin   PAD CAD Hypertension - Resume losartan  as BP tolerates.  Currently requiring diuretics  Status post right AKA - Continue use of prosthetic leg  COPD - Does not carry official diagnosis but there is evidence of emphysema on CTA - Has 50-pack-year smoking, quit 30 years ago - Pulmonary consult as above, will need outpatient follow-up for PFTs.  Has been started on Breo  High risk aspiration secondary to increased work of breathing - Speech therapy working with patient.  Continue with dysphagia diet  Body mass index is 23.28 kg/m.   DVT prophylaxis: Eliquis    Code Status: Limited: Do not attempt resuscitation (DNR) -DNR-LIMITED -Do Not Intubate/DNI  Disposition: Now medically ready for discharge tomorrow, pending SNF placement  Consultants:    Procedures:    Antimicrobials:  Anti-infectives (From admission, onward)    None       Data Reviewed: I have personally reviewed following labs and imaging studies CBC: Recent Labs  Lab 07/17/24 0825 07/18/24 0423 07/19/24 0446 07/20/24 0549 07/21/24 0544  WBC 11.0* 12.8* 13.0* 16.4* 14.5*  NEUTROABS 8.8* 11.3* 11.4* 12.8* 9.9*  HGB 15.1* 14.2 14.4 13.5 13.7  HCT 45.4 41.8 43.2 40.0 42.0  MCV 95.4 95.0 96.6 96.6 98.6  PLT 257 264 311 327 323   Basic Metabolic Panel: Recent Labs  Lab 07/17/24 0825 07/18/24 0423 07/19/24 0446 07/20/24 0549 07/21/24 0544  NA 133* 132* 134* 136 138  K 3.9 4.3 4.6 4.2 4.1  CL 95* 93* 94* 96* 99  CO2 28 29 29 30 31   GLUCOSE 127* 166* 169* 232* 109*  BUN 29* 29* 30* 28* 41*  CREATININE 0.74 0.68 0.66 0.62 0.72  CALCIUM  8.9 8.9 8.9 8.9 8.7*  MG  --  2.6* 2.4 2.3 2.5*  PHOS  --  4.1 3.7 3.1 3.4   GFR: Estimated Creatinine Clearance: 53.4 mL/min (by C-G formula based on SCr of 0.72 mg/dL). Liver Function Tests: Recent Labs  Lab 07/17/24 0825 07/18/24 0423 07/19/24 0446  07/20/24 0549 07/21/24 0544  AST 51* 53* 55* 40 70*  ALT 27 40 52* 46* 96*  ALKPHOS 78 75 74 73 71  BILITOT 0.5 0.3 0.2 0.2 <0.2  PROT 7.2 6.9 6.7 6.6 6.4*  ALBUMIN 2.8* 2.9* 2.9* 2.8* 2.8*   CBG: No results for input(s): GLUCAP in the last 168 hours.  Recent Results (from the past 240 hours)  Resp panel by RT-PCR (RSV, Flu A&B, Covid) Anterior Nasal Swab     Status: None   Collection Time: 07/14/24  5:02 AM   Specimen: Anterior Nasal Swab  Result Value Ref Range Status   SARS Coronavirus 2 by RT PCR NEGATIVE NEGATIVE Final    Comment: (NOTE) SARS-CoV-2 target nucleic acids are NOT DETECTED.  The SARS-CoV-2 RNA is generally detectable in upper respiratory specimens during the acute phase of infection. The lowest concentration of SARS-CoV-2 viral copies this assay can detect is 138 copies/mL. A negative result does not preclude SARS-Cov-2 infection and should not be used as the sole basis for treatment or other patient management decisions. A negative result may occur with  improper specimen collection/handling, submission of  specimen other than nasopharyngeal swab, presence of viral mutation(s) within the areas targeted by this assay, and inadequate number of viral copies(<138 copies/mL). A negative result must be combined with clinical observations, patient history, and epidemiological information. The expected result is Negative.  Fact Sheet for Patients:  bloggercourse.com  Fact Sheet for Healthcare Providers:  seriousbroker.it  This test is no t yet approved or cleared by the [place]  FDA and  has been authorized for detection and/or diagnosis of SARS-CoV-2 by FDA under an Emergency Use Authorization (EUA). This EUA will remain  in effect (meaning this test can be used) for the duration of the COVID-19 declaration under Section 564(b)(1) of the Act, 21 U.S.C.section 360bbb-3(b)(1), unless the authorization is  terminated  or revoked sooner.       Influenza A by PCR NEGATIVE NEGATIVE Final   Influenza B by PCR NEGATIVE NEGATIVE Final    Comment: (NOTE) The Xpert Xpress SARS-CoV-2/FLU/RSV plus assay is intended as an aid in the diagnosis of influenza from Nasopharyngeal swab specimens and should not be used as a sole basis for treatment. Nasal washings and aspirates are unacceptable for Xpert Xpress SARS-CoV-2/FLU/RSV testing.  Fact Sheet for Patients: bloggercourse.com  Fact Sheet for Healthcare Providers: seriousbroker.it  This test is not yet approved or cleared by the [place]  FDA and has been authorized for detection and/or diagnosis of SARS-CoV-2 by FDA under an Emergency Use Authorization (EUA). This EUA will remain in effect (meaning this test can be used) for the duration of the COVID-19 declaration under Section 564(b)(1) of the Act, 21 U.S.C. section 360bbb-3(b)(1), unless the authorization is terminated or revoked.     Resp Syncytial Virus by PCR NEGATIVE NEGATIVE Final    Comment: (NOTE) Fact Sheet for Patients: bloggercourse.com  Fact Sheet for Healthcare Providers: seriousbroker.it  This test is not yet approved or cleared by the [place]  FDA and has been authorized for detection and/or diagnosis of SARS-CoV-2 by FDA under an Emergency Use Authorization (EUA). This EUA will remain in effect (meaning this test can be used) for the duration of the COVID-19 declaration under Section 564(b)(1) of the Act, 21 U.S.C. section 360bbb-3(b)(1), unless the authorization is terminated or revoked.  Performed at [facility], [address] [place], [place] [address]   Blood culture (routine x 2)     Status: None   Collection Time: 07/14/24  5:49 AM   Specimen: BLOOD  Result Value Ref Range Status   Specimen Description BLOOD BLOOD LEFT ARM  Final    Special Requests   Final    BOTTLES DRAWN AEROBIC AND ANAEROBIC Blood Culture results may not be optimal due to an inadequate volume of blood received in culture bottles   Culture   Final    NO GROWTH 5 DAYS Performed at [facility], [address] [place], [place] [address]    Report Status 07/19/2024 FINAL  Final  Blood culture (routine x 2)     Status: None   Collection Time: 07/14/24  5:49 AM   Specimen: BLOOD  Result Value Ref Range Status   Specimen Description BLOOD BLOOD LEFT ARM  Final   Special Requests   Final    BOTTLES DRAWN AEROBIC AND ANAEROBIC Blood Culture results may not be optimal due to an inadequate volume of blood received in culture bottles   Culture   Final    NO GROWTH 5 DAYS Performed at [facility], [address] [place], [place] [address]  Report Status 07/19/2024 FINAL  Final  Body fluid culture w Gram Stain     Status: None   Collection Time: 07/14/24  2:05 PM   Specimen: PATH Cytology Pleural fluid  Result Value Ref Range Status   Specimen Description   Final    PLEURAL Performed at [facility], [address] [place], [place] [address]    Special Requests   Final    NONE Performed at [facility], [address] [place], [place] [address]    Gram Stain   Final    FEW WBC PRESENT,BOTH PMN AND MONONUCLEAR NO ORGANISMS SEEN    Culture   Final    NO GROWTH 4 DAYS Performed at [facility], [address], [place], [place] [address]    Report Status 07/18/2024 FINAL  Final     Radiology Studies: No results found.   Scheduled Meds:  apixaban   2.5 mg Oral BID   digoxin   0.125 mg Oral Daily   DULoxetine   60 mg Oral BID   feeding supplement  237 mL Oral TID BM   fluticasone  furoate-vilanterol  1 puff Inhalation Daily   ivabradine   7.5 mg Oral BID WC   multivitamin with minerals  1 tablet Oral Daily   polyethylene glycol  17 g Oral Daily   Continuous Infusions:   LOS: 7 days  MDM:  Patient is high risk for one or more organ failure.  They necessitate ongoing hospitalization for continued IV therapies and subsequent lab monitoring. Total time spent interpreting labs and vitals, reviewing the medical record, coordinating care amongst consultants and care team members, directly assessing and discussing care with the patient and/or family: 55 min  Sara Trettin, DO [facility]  To contact the attending physician between 7A-7P please use Epic Chat. To contact the covering physician during after hours 7P-7A, please review Amion.  07/21/2024, 12:33 PM   *This document has been created with the assistance of dictation software. Please excuse typographical errors. *

## 2024-07-22 DIAGNOSIS — Z7189 Other specified counseling: Secondary | ICD-10-CM

## 2024-07-22 LAB — COMPREHENSIVE METABOLIC PANEL WITH GFR
ALT: 100 U/L — ABNORMAL HIGH (ref 0–44)
AST: 52 U/L — ABNORMAL HIGH (ref 15–41)
Albumin: 2.9 g/dL — ABNORMAL LOW (ref 3.5–5.0)
Alkaline Phosphatase: 70 U/L (ref 38–126)
Anion gap: 8 (ref 5–15)
BUN: 37 mg/dL — ABNORMAL HIGH (ref 8–23)
CO2: 29 mmol/L (ref 22–32)
Calcium: 8.5 mg/dL — ABNORMAL LOW (ref 8.9–10.3)
Chloride: 99 mmol/L (ref 98–111)
Creatinine, Ser: 0.5 mg/dL (ref 0.44–1.00)
GFR, Estimated: >60 mL/min (ref >60)
Glucose, Bld: 123 mg/dL — ABNORMAL HIGH (ref 70–99)
Potassium: 4.7 mmol/L (ref 3.5–5.1)
Sodium: 136 mmol/L (ref 135–145)
Total Bilirubin: <0.2 mg/dL (ref 0.0–1.2)
Total Protein: 5.9 g/dL — ABNORMAL LOW (ref 6.5–8.1)

## 2024-07-22 LAB — CBC WITH DIFFERENTIAL/PLATELET
Abs Immature Granulocytes: 1.71 K/uL — ABNORMAL HIGH (ref 0.00–0.07)
Basophils Absolute: 0.1 K/uL (ref 0.0–0.1)
Basophils Relative: 1 %
Eosinophils Absolute: 0.1 K/uL (ref 0.0–0.5)
Eosinophils Relative: 0 %
HCT: 41.4 % (ref 36.0–46.0)
Hemoglobin: 13.7 g/dL (ref 12.0–15.0)
Immature Granulocytes: 9 %
Lymphocytes Relative: 8 %
Lymphs Abs: 1.6 K/uL (ref 0.7–4.0)
MCH: 32.2 pg (ref 26.0–34.0)
MCHC: 33.1 g/dL (ref 30.0–36.0)
MCV: 97.2 fL (ref 80.0–100.0)
Monocytes Absolute: 1 K/uL (ref 0.1–1.0)
Monocytes Relative: 5 %
Neutro Abs: 15 K/uL — ABNORMAL HIGH (ref 1.7–7.7)
Neutrophils Relative %: 77 %
Platelets: 323 K/uL (ref 150–400)
RBC: 4.26 MIL/uL (ref 3.87–5.11)
RDW: 13.2 % (ref 11.5–15.5)
WBC: 19.4 K/uL — ABNORMAL HIGH (ref 4.0–10.5)
nRBC: 0 % (ref 0.0–0.2)

## 2024-07-22 LAB — MAGNESIUM: Magnesium: 2.4 mg/dL (ref 1.7–2.4)

## 2024-07-22 LAB — PHOSPHORUS: Phosphorus: 3.7 mg/dL (ref 2.5–4.6)

## 2024-07-22 NOTE — Plan of Care (Signed)
  Problem: Education: Goal: Knowledge of General Education information will improve Description: Including pain rating scale, medication(s)/side effects and non-pharmacologic comfort measures 07/22/2024 1610 by Merilee Izetta SAUNDERS, LPN Outcome: Progressing 07/22/2024 1609 by Merilee Izetta SAUNDERS, LPN Outcome: Progressing   Problem: Health Behavior/Discharge Planning: Goal: Ability to manage health-related needs will improve 07/22/2024 1610 by Merilee, Neave Lenger R, LPN Outcome: Progressing 07/22/2024 1609 by Merilee, Loretta Kluender R, LPN Outcome: Progressing   Problem: Clinical Measurements: Goal: Ability to maintain clinical measurements within normal limits will improve 07/22/2024 1610 by Merilee, Arvella Massingale R, LPN Outcome: Progressing 07/22/2024 1609 by Merilee, Ibrahima Holberg R, LPN Outcome: Progressing Goal: Will remain free from infection 07/22/2024 1610 by Merilee, Yasir Kitner R, LPN Outcome: Progressing 07/22/2024 1609 by Merilee, Jaxxon Naeem R, LPN Outcome: Progressing Goal: Diagnostic test results will improve 07/22/2024 1610 by Merilee, Suhaila Troiano R, LPN Outcome: Progressing 07/22/2024 1609 by Merilee, Kyrus Hyde R, LPN Outcome: Progressing Goal: Respiratory complications will improve 07/22/2024 1610 by Merilee, Dearra Myhand R, LPN Outcome: Progressing 07/22/2024 1609 by Merilee, Irbin Fines R, LPN Outcome: Progressing Goal: Cardiovascular complication will be avoided 07/22/2024 1610 by Merilee Izetta SAUNDERS, LPN Outcome: Progressing 07/22/2024 1609 by Merilee Izetta SAUNDERS, LPN Outcome: Progressing   Problem: Activity: Goal: Risk for activity intolerance will decrease 07/22/2024 1610 by Merilee Izetta SAUNDERS, LPN Outcome: Progressing 07/22/2024 1609 by Merilee Izetta SAUNDERS, LPN Outcome: Progressing   Problem: Nutrition: Goal: Adequate nutrition will be maintained 07/22/2024 1610 by Merilee, Fabion Gatson R, LPN Outcome: Progressing 07/22/2024 1609 by Merilee, Rosealynn Mateus R, LPN Outcome: Progressing   Problem: Coping: Goal: Level of anxiety will  decrease 07/22/2024 1610 by Merilee Izetta SAUNDERS, LPN Outcome: Progressing 07/22/2024 1609 by Merilee, Agapita Savarino R, LPN Outcome: Progressing   Problem: Elimination: Goal: Will not experience complications related to bowel motility 07/22/2024 1610 by Merilee Izetta SAUNDERS, LPN Outcome: Progressing 07/22/2024 1609 by Merilee, Marla Pouliot R, LPN Outcome: Progressing Goal: Will not experience complications related to urinary retention 07/22/2024 1610 by Merilee, Hanna Aultman R, LPN Outcome: Progressing 07/22/2024 1609 by Merilee, Eevie Lapp R, LPN Outcome: Progressing   Problem: Pain Managment: Goal: General experience of comfort will improve and/or be controlled 07/22/2024 1610 by Merilee, Bo Teicher R, LPN Outcome: Progressing 07/22/2024 1609 by Merilee, Tevyn Codd R, LPN Outcome: Progressing   Problem: Safety: Goal: Ability to remain free from injury will improve 07/22/2024 1610 by Merilee, Laticia Vannostrand R, LPN Outcome: Progressing 07/22/2024 1609 by Merilee, Tavoris Brisk R, LPN Outcome: Progressing   Problem: Skin Integrity: Goal: Risk for impaired skin integrity will decrease 07/22/2024 1610 by Merilee Izetta SAUNDERS, LPN Outcome: Progressing 07/22/2024 1609 by Merilee Izetta SAUNDERS, LPN Outcome: Progressing

## 2024-07-22 NOTE — Plan of Care (Signed)
  Problem: Clinical Measurements: Goal: Ability to maintain clinical measurements within normal limits will improve Outcome: Progressing Goal: Cardiovascular complication will be avoided Outcome: Progressing   Problem: Coping: Goal: Level of anxiety will decrease Outcome: Progressing   Problem: Safety: Goal: Ability to remain free from injury will improve Outcome: Progressing

## 2024-07-22 NOTE — Progress Notes (Signed)
 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  On 11/25 she underwent IR guided left thoracentesis.  Pulmonology was also consulted. Patient was started on prednisone  and began to wean from high flow nasal cannula.  Still very dyspneic and moving very slowly with therapy services. Now awaiting SNF placement.   Subjective: No acute events overnight.  Continues to work well with therapy services.  Endorses desire to go home.  We once again discussed her overall weakness and my concerns about her discharging home on oxygen increasing her risk of fall.  She endorses understanding and agrees that she is similar concerns.  She would like to remain here awaiting SNF placement  Objective: Vitals:   07/21/24 1819 07/21/24 2012 07/22/24 0432 07/22/24 0800  BP: 130/61 116/61 134/69 124/66  Pulse: 82 80 78 79  Resp: 17 (!) 24 (!) 24 16  Temp:  98.1 F (36.7 C) 98.3 F (36.8 C) 98.3 F (36.8 C)  TempSrc:  Oral Oral Oral  SpO2: 96% 92% 92% 91%  Weight:        Intake/Output Summary (Last 24 hours) at 07/22/2024 1507 Last data filed at 07/22/2024 1013 Gross per 24 hour  Intake 300 ml  Output --  Net 300 ml   Filed Weights   07/14/24 0411  Weight: 59.6 kg    Examination: General exam: Appears calm and comfortable, NAD, appears frail Respiratory system: Improvement in aeration bilaterally, 2L  in place    Cardiovascular system: S1 & S2 heard, RRR.  Gastrointestinal system: Abdomen is nondistended, soft and nontender.  Neuro:  Alert and oriented. No focal neurological deficits. Extremities: Symmetric, expected ROM Skin: No rashes, lesions Psychiatry: Mood & affect appropriate for situation.   Assessment & Plan:  Principal Problem:   Pleural effusion Active Problems:   Malnutrition of moderate degree    Hypoxic respiratory failure Bilateral pleural effusions - This is multifactorial due to sarcoidosis, and likely component of COPD - Has successfully weaned to 2 L now - May require oxygen at baseline though with continued therapy at SNF she may able to wean entirely before discharging home. - Suspect much of her O2 requirement now is due to shallow respirations and atelectasis as she is very deconditioned. - Continue aggressive PT/OT, pending SNF placement - Initial effusions on arrival left greater than right.  No PE on CTA - Diagnostic and therapeutic thoracentesis 11/25.  Gram stain negative, culture negative.  LD very elevated, cyto path negative.  Clinical picture most consistent with Sarcoid Associated pulmonary effusions - Received 5 total days Methylpred/prednisone : 11/27- 12/1 - No infectious signs or symptoms - Negative RVP - Continue with breathing treatments - Pulmonology consulted, added Breo Ellipta  11/26 - Status post IV diuresis - Echo with EF 60 to 65%, no regional wall motion abnormalities, LV diastolic parameters normal.  No significant valvular abnormalities  Sarcoidosis - Pulmonary consult to Dr. Fernand - On [place], as needed DuoNebs - Patient reports prior to this she was mostly asymptomatic - s/p 5 days steroid therapy.  Significant improvement since initiation  History of low back  pain Limb pain - Continue home dose duloxetine  and gabapentin  - Spinal cord stimulator in place - PT/OT recommending SNF.  TOC aware.  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 - Continues to improve with diuretic use -  Continue to trend CMP  Sinus tachycardia, chronic - Resolved.  Continue home digoxin   PAD CAD Hypertension - Resume losartan  as BP tolerates.  Currently requiring diuretics  Status post right AKA - Continue use of prosthetic leg  COPD - Does not carry official diagnosis but there is evidence of emphysema on CTA - Has 50-pack-year smoking, quit 30 years ago - Pulmonary consult as above, will need outpatient follow-up for PFTs.  Has been started on Breo  High risk aspiration secondary to increased work of breathing - Speech therapy working with patient.  Continue with dysphagia diet  Body mass index is 23.28 kg/m.   DVT prophylaxis: Eliquis    Code Status: Limited: Do not attempt resuscitation (DNR) -DNR-LIMITED -Do Not Intubate/DNI  Disposition: Now medically ready for discharge, pending SNF placement  Consultants:    Procedures:    Antimicrobials:  Anti-infectives (From admission, onward)    None       Data Reviewed: I have personally reviewed following labs and imaging studies CBC: Recent Labs  Lab 07/18/24 0423 07/19/24 0446 07/20/24 0549 07/21/24 0544 07/22/24 0744  WBC 12.8* 13.0* 16.4* 14.5* 19.4*  NEUTROABS 11.3* 11.4* 12.8* 9.9* 15.0*  HGB 14.2 14.4 13.5 13.7 13.7  HCT 41.8 43.2 40.0 42.0 41.4  MCV 95.0 96.6 96.6 98.6 97.2  PLT 264 311 327 323 323   Basic Metabolic Panel: Recent Labs  Lab 07/18/24 0423 07/19/24 0446 07/20/24 0549 07/21/24 0544 07/22/24 0508  NA 132* 134* 136 138 136  K 4.3 4.6 4.2 4.1 4.7  CL 93* 94* 96* 99 99  CO2 29 29 30 31 29   GLUCOSE 166* 169* 232* 109* 123*  BUN 29* 30* 28* 41* 37*  CREATININE 0.68 0.66 0.62 0.72 0.50  CALCIUM  8.9 8.9 8.9 8.7* 8.5*  MG 2.6* 2.4 2.3 2.5* 2.4  PHOS 4.1 3.7 3.1 3.4 3.7   GFR: Estimated Creatinine Clearance: 53.4 mL/min (by C-G formula based on SCr of 0.5 mg/dL). Liver Function Tests: Recent Labs  Lab 07/18/24 0423 07/19/24 0446 07/20/24 0549 07/21/24 0544 07/22/24 0508   AST 53* 55* 40 70* 52*  ALT 40 52* 46* 96* 100*  ALKPHOS 75 74 73 71 70  BILITOT 0.3 0.2 0.2 <0.2 <0.2  PROT 6.9 6.7 6.6 6.4* 5.9*  ALBUMIN 2.9* 2.9* 2.8* 2.8* 2.9*   CBG: No results for input(s): GLUCAP in the last 168 hours.  Recent Results (from the past 240 hours)  Resp panel by RT-PCR (RSV, Flu A&B, Covid) Anterior Nasal Swab     Status: None   Collection Time: 07/14/24  5:02 AM   Specimen: Anterior Nasal Swab  Result Value Ref Range Status   SARS Coronavirus 2 by RT PCR NEGATIVE NEGATIVE Final    Comment: (NOTE) SARS-CoV-2 target nucleic acids are NOT DETECTED.  The SARS-CoV-2 RNA is generally detectable in upper respiratory specimens during the acute phase of infection. The lowest concentration of SARS-CoV-2 viral copies this assay can detect is 138 copies/mL. A negative result does not preclude SARS-Cov-2 infection and should not be used as the sole basis for treatment or other patient management decisions. A negative result may occur with  improper specimen collection/handling, submission of specimen other than nasopharyngeal swab, presence  of viral mutation(s) within the areas targeted by this assay, and inadequate number of viral copies(<138 copies/mL). A negative result must be combined with clinical observations, patient history, and epidemiological information. The expected result is Negative.  Fact Sheet for Patients:  bloggercourse.com  Fact Sheet for Healthcare Providers:  seriousbroker.it  This test is no t yet approved or cleared by the [place]  FDA and  has been authorized for detection and/or diagnosis of SARS-CoV-2 by FDA under an Emergency Use Authorization (EUA). This EUA will remain  in effect (meaning this test can be used) for the duration of the COVID-19 declaration under Section 564(b)(1) of the Act, 21 U.S.C.section 360bbb-3(b)(1), unless the authorization is terminated  or revoked  sooner.       Influenza A by PCR NEGATIVE NEGATIVE Final   Influenza B by PCR NEGATIVE NEGATIVE Final    Comment: (NOTE) The Xpert Xpress SARS-CoV-2/FLU/RSV plus assay is intended as an aid in the diagnosis of influenza from Nasopharyngeal swab specimens and should not be used as a sole basis for treatment. Nasal washings and aspirates are unacceptable for Xpert Xpress SARS-CoV-2/FLU/RSV testing.  Fact Sheet for Patients: bloggercourse.com  Fact Sheet for Healthcare Providers: seriousbroker.it  This test is not yet approved or cleared by the [place]  FDA and has been authorized for detection and/or diagnosis of SARS-CoV-2 by FDA under an Emergency Use Authorization (EUA). This EUA will remain in effect (meaning this test can be used) for the duration of the COVID-19 declaration under Section 564(b)(1) of the Act, 21 U.S.C. section 360bbb-3(b)(1), unless the authorization is terminated or revoked.     Resp Syncytial Virus by PCR NEGATIVE NEGATIVE Final    Comment: (NOTE) Fact Sheet for Patients: bloggercourse.com  Fact Sheet for Healthcare Providers: seriousbroker.it  This test is not yet approved or cleared by the [place]  FDA and has been authorized for detection and/or diagnosis of SARS-CoV-2 by FDA under an Emergency Use Authorization (EUA). This EUA will remain in effect (meaning this test can be used) for the duration of the COVID-19 declaration under Section 564(b)(1) of the Act, 21 U.S.C. section 360bbb-3(b)(1), unless the authorization is terminated or revoked.  Performed at [facility], [address] [place], [place] [address]   Blood culture (routine x 2)     Status: None   Collection Time: 07/14/24  5:49 AM   Specimen: BLOOD  Result Value Ref Range Status   Specimen Description BLOOD BLOOD LEFT ARM  Final   Special Requests   Final     BOTTLES DRAWN AEROBIC AND ANAEROBIC Blood Culture results may not be optimal due to an inadequate volume of blood received in culture bottles   Culture   Final    NO GROWTH 5 DAYS Performed at [facility], [address] [place], [place] [address]    Report Status 07/19/2024 FINAL  Final  Blood culture (routine x 2)     Status: None   Collection Time: 07/14/24  5:49 AM   Specimen: BLOOD  Result Value Ref Range Status   Specimen Description BLOOD BLOOD LEFT ARM  Final   Special Requests   Final    BOTTLES DRAWN AEROBIC AND ANAEROBIC Blood Culture results may not be optimal due to an inadequate volume of blood received in culture bottles   Culture   Final    NO GROWTH 5 DAYS Performed at [facility], [address], [place], [place] [address]    Report Status 07/19/2024 FINAL  Final  Body fluid culture w Gram Stain     Status: None   Collection Time: 07/14/24  2:05 PM   Specimen: PATH Cytology Pleural fluid  Result Value Ref Range Status   Specimen Description   Final    PLEURAL Performed at [facility], [address] [place], [place] [address]    Special Requests   Final    NONE Performed at [facility], [address] [place], [place] [address]    Gram Stain   Final    FEW WBC PRESENT,BOTH PMN AND MONONUCLEAR NO ORGANISMS SEEN    Culture   Final    NO GROWTH 4 DAYS Performed at [facility], [address] [place], [place] [address]    Report Status 07/18/2024 FINAL  Final     Radiology Studies: No results found.   Scheduled Meds:  apixaban   2.5 mg Oral BID   digoxin   0.125 mg Oral Daily   DULoxetine   60 mg Oral BID   feeding supplement  237 mL Oral TID BM   fluticasone  furoate-vilanterol  1 puff Inhalation Daily   ivabradine   7.5 mg Oral BID WC   multivitamin with minerals  1 tablet Oral Daily   polyethylene glycol  17 g Oral Daily   Continuous Infusions:   LOS: 8 days  MDM: Patient is high risk for one  or more organ failure.  They necessitate ongoing hospitalization for continued IV therapies and subsequent lab monitoring. Total time spent interpreting labs and vitals, reviewing the medical record, coordinating care amongst consultants and care team members, directly assessing and discussing care with the patient and/or family: 55 min  Sara Rotolo, DO [facility]  To contact the attending physician between 7A-7P please use Epic Chat. To contact the covering physician during after hours 7P-7A, please review Amion.  07/22/2024, 3:07 PM   *This document has been created with the assistance of dictation software. Please excuse typographical errors. *

## 2024-07-23 MED ORDER — ALPRAZOLAM 0.5 MG PO TABS
0.5000 mg | ORAL_TABLET | Freq: Two times a day (BID) | ORAL | Status: DC | PRN
  Administered 2024-07-23: 0.5 mg via ORAL
  Filled 2024-07-23: qty 1

## 2024-07-23 NOTE — Plan of Care (Signed)
 PMT shadowing. Patient remains ready for discharge and awaiting SNF placement.  Goals set for DNR/DNI.  Brother would be surrogate management consultant.   PMT will continue to shadow.

## 2024-07-23 NOTE — TOC Progression Note (Addendum)
 Transition of Care [facility]) - Progression Note    Patient Details  Name: Sara [name] MRN: [id number] Date of Birth: October 10, 1950  Transition of Care [facility]) CM/SW Contact  Daved JONETTA Hamilton, RN Phone Number: 07/23/2024, 8:42 AM  Clinical Narrative:     [facility] selected in [place], [place] at [facility] notified. Patient is not service connected for [facility]. Requested prior auth started for secondary insurance [facility] Choice PPO.    Update:  Pending AuthID: [id number]   Expected Discharge Plan: Skilled Nursing Facility Barriers to Discharge: Continued Medical Work up               Expected Discharge Plan and Services   Discharge Planning Services: CM Consult Post Acute Care Choice: Skilled Nursing Facility Living arrangements for the past 2 months: Apartment (lives on 1st floor)                                       Social Drivers of Health (SDOH) Interventions SDOH Screenings   Food Insecurity: No Food Insecurity (07/14/2024)  Housing: Low Risk  (07/14/2024)  Transportation Needs: No Transportation Needs (07/14/2024)  Utilities: Not At Risk (07/14/2024)  Depression (PHQ2-9): Low Risk  (05/22/2023)  Financial Resource Strain: Patient Declined (07/16/2023)   Received from [facility]  Social Connections: Socially Isolated (07/14/2024)  Tobacco Use: Medium Risk (06/16/2024)    Readmission Risk Interventions     No data to display

## 2024-07-23 NOTE — Progress Notes (Signed)
 Physical Therapy Treatment Patient Details Name: Rokia Bosket MRN: [id number] DOB: 1951-01-28 Today's Date: 07/23/2024   History of Present Illness Jakara Blatter is a 73 y.o. 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  Presented to ER secondary to persistent cough, SOB; admitted for management of acute hypoxic respiratory failure secondary to bilat pleural effusion, sarcoidosis.  Hospital course significant for thoracentesis (11/24) with removed.    PT Comments  Pt was long sitting in bed on 3 L o2 [place] upon arrival. She is A and O x 4 but does present with slow processing at times. Pt is slightly SOB at rest but sao2 > 92% on 3L throughout session. SOB gets worse during OOB activity. While seated EOB, pt was able to safely/I'ly apply prosthesis. She endorses use of single crutch at baseline however is currently encouraged to use RW for additional safety. Overall SOB, limited session progression versus pain or strength deficits. Remains far from her baseline and will benefit from continued skilled PT at DC to maximize her independence and safety with all ADLs.    If plan is discharge home, recommend the following: A little help with walking and/or transfers;A little help with bathing/dressing/bathroom;Help with stairs or ramp for entrance;Assistance with cooking/housework;Assist for transportation     Equipment Recommendations  Other (comment) (Pt would benefit from RW but defer to STR)       Precautions / Restrictions Precautions Precautions: Fall Recall of Precautions/Restrictions: Intact Precaution/Restrictions Comments: R AKA Restrictions Weight Bearing Restrictions Per Provider Order: No     Mobility  Bed Mobility Overal bed mobility: Modified Independent Bed Mobility: Supine to Sit  Supine to  sit: Modified independent (Device/Increase time)  General bed mobility comments: Heavy use of bed rail + increased time. struggles to get foot to floor and progress hips to EOB    Transfers Overall transfer level: Needs assistance Equipment used: Rolling walker (2 wheels) Transfers: Sit to/from Stand Sit to Stand: Contact guard assist  General transfer comment: Pt was able to I'ly apply prosthetic however reports she does not usually use RW but a single crutch. Pt was agreeable to RW use for ambulation in room.    Ambulation/Gait Ambulation/Gait assistance: Contact guard assist, Supervision Gait Distance (Feet): 20 Feet Assistive device: Rolling walker (2 wheels) Gait Pattern/deviations: Step-to pattern, Step-through pattern Gait velocity: decreased  General Gait Details: Pt was able to progress form step to too step through gait pattern. She remains on 3 L o2 with sao2 >92% however gets extremely SOB with minimal activity. Pt endorses SOB is new.    Balance Overall balance assessment: Needs assistance, Mild deficits observed, not formally tested Sitting-balance support: Single extremity supported Sitting balance-Leahy Scale: Good     Standing balance support: Bilateral upper extremity supported, During functional activity, Reliant on assistive device for balance Standing balance-Leahy Scale: Fair Standing balance comment: Pt remains at risk of falls with poor overall gait tolerance from an endurance standpoint      Communication    Cognition Arousal: Alert Behavior During Therapy: Anxious, WFL for tasks assessed/performed   PT - Cognitive impairments: No apparent impairments    PT - Cognition Comments: Pt was A and O x 4. Agreeable to session but slightly SOB. SAo2 > 92% on 3 L o2 [place]. pt eager to eat breakfast but awaiting on tray. Following commands:  Intact      Cueing Cueing Techniques: Verbal cues, Tactile cues         Pertinent Vitals/Pain Pain Assessment Pain  Assessment: No/denies pain Pain Score: 0-No pain     PT Goals (current goals can now be found in the care plan section) Acute Rehab PT Goals Patient Stated Goal: agreeable to chair Progress towards PT goals: Progressing toward goals    Frequency    Min 2X/week       AM-PAC PT 6 Clicks Mobility   Outcome Measure  Help needed turning from your back to your side while in a flat bed without using bedrails?: None Help needed moving from lying on your back to sitting on the side of a flat bed without using bedrails?: None Help needed moving to and from a bed to a chair (including a wheelchair)?: A Little Help needed standing up from a chair using your arms (e.g., wheelchair or bedside chair)?: A Little Help needed to walk in hospital room?: A Little Help needed climbing 3-5 steps with a railing? : A Lot 6 Click Score: 19    End of Session Equipment Utilized During Treatment: Oxygen (3L o2 [place]) Activity Tolerance: Patient tolerated treatment well;Patient limited by fatigue Patient left: in chair;with call bell/phone within reach;with chair alarm set Nurse Communication: Mobility status PT Visit Diagnosis: Muscle weakness (generalized) (M62.81);Difficulty in walking, not elsewhere classified (R26.2)     Time: 9149-9092 PT Time Calculation (min) (ACUTE ONLY): 17 min  Charges:    $Therapeutic Activity: 8-22 mins PT General Charges $$ ACUTE PT VISIT: 1 Visit                     Rankin Essex PTA 07/23/24, 9:44 AM

## 2024-07-23 NOTE — TOC Progression Note (Signed)
 Transition of Care [facility]) - Progression Note    Patient Details  Name: Sara [name] MRN: [id number] Date of Birth: 05/11/1951  [facility]) CM/SW Contact  Daved JONETTA Hamilton, RN Phone Number: 07/23/2024, 8:41 AM  Clinical Narrative:     Late Entry:  Met with patient to discuss SNF bed offer. Patient verbalized [facility] as choice.   Expected Discharge Plan: Skilled Nursing Facility Barriers to Discharge: Continued Medical Work up               Expected Discharge Plan and Services   Discharge Planning Services: CM Consult Post Acute Care Choice: Skilled Nursing Facility Living arrangements for the past 2 months: Apartment (lives on 1st floor)                                       Social Drivers of Health (SDOH) Interventions SDOH Screenings   Food Insecurity: No Food Insecurity (07/14/2024)  Housing: Low Risk  (07/14/2024)  Transportation Needs: No Transportation Needs (07/14/2024)  Utilities: Not At Risk (07/14/2024)  Depression (PHQ2-9): Low Risk  (05/22/2023)  Financial Resource Strain: Patient Declined (07/16/2023)   [facility]  Social Connections: Socially Isolated (07/14/2024)  Tobacco Use: Medium Risk (06/16/2024)    Readmission Risk Interventions     No data to display

## 2024-07-23 NOTE — Progress Notes (Signed)
 Speech Language Pathology Treatment: Dysphagia  Patient Details Name: Sara [name] MRN: [id number] DOB: 1950/09/05 Today's Date: 07/23/2024 Time: 1000-1040 SLP Time Calculation (min) (ACUTE ONLY): 40 min  Assessment / Plan / Recommendation Clinical Impression  Pt seen for ongoing assessment of toleration of diet and swallowing. Pt appears to present w/ grossly functional oropharyngeal phase swallow when following general aspiration precautions and monitoring her Pulmonary status during oral intake. The same was noted at BSE ~7 days ago. NSG reported pt is tolerating her current mech soft diet and Pills in Puree.  Noted Straws were in cups in room - pt is recommended NOT to use straws d/t her risk for aspiration w/ liquids. This was d/w pt and NSG.   Pt presents w/ risk for aspiration/dysphagia as well as Reduced oral intake in setting of declined Pulmonary status and Esophageal phase dysmotility/GERD. Pt also requires support w/ tray setup and sitting up at meals d/t Deconditioned status. Frequent REST BREAKS for conservation of energy required during any/all po intake/tasks d/t WOB/SOB secondary to the exertion increasing respiratory demand/effort.    Pt's dysphagia risk appears secondary to impact from her current Pulmonary status/decline and Deconditioning overall secondary to extended illness and her Baseline Comorbidities. She continues on O2 support(weaned down to 3L from 7L at BSE). Frequent REST BREAKS w/ any exertion/task aids to ease respiratory demand. ANY such significant Pulmonary decline can impact Apnea timing and airway closure during the swallow which can impact pharyngeal swallowing, airway protection, and increase risk for aspiration to occur thus further Pulmonary impact/decline. Pt also has Baseline dx of GERD; ANY Esophageal phase Dysmotility or Regurgitation of Reflux material can increase risk for aspiration of the Reflux material during Retrograde flow thus impact Voicing  and Pulmonary status.    During po's this session, pt fed self sips of water  Via Cup(10+) and boluses of soft/moistened solids w/ REST BREAKS b/t trials and careful monitoring of pt's RR. No overt, clinical s/s of aspiration noted w/ trials given: no cough/throat clearing, no wet vocal quality, and respiratory effort appeared to only minimally increase w/ the effort from the exertion of the tasks/mastication but calmed again (to her baseline) w/ the REST BREAKS b/t trials. Swallowing of liquids was timely- no cough. Oral phase appeared grossly [facility] for trial consistencies given -- solid food trials were broken small and moistened well to lessen exertion of task(mastication) and for conservation of energy.  OF NOTE: pt attempted drinking via Straw 1x w/ coughing noted immediately. Straws were not used thereafter.   Recommend continue a more Mech Soft diet w/ Meats Minced for the cut/moistened foods to reduce oral phase effort (conservation of energy); Thin liquids VIA CUP in single, small sips. Pt should help to feed self but tray setup and support should be given for conservation of her energy. Must sit fully upright w/ oral intake. Pills given Whole vs Crushed in Puree for ease, safety of swallowing. Aspiration precautions to include Small bites/sips Slowly and frequent REST BREAKS during oral intake w/ close monitoring of RR or increased mouth breathing. GERD/REFLUX precautions.  Palliative Care support is ongoing per chart. Dietician f/u for support. Education given on Pills in Puree; food consistencies and easy to eat options; aspiration precautions to pt.  No further Acute ST services indicated at this time. MD to reconsult if new needs arise during admit.  NSG updated, agreed. MD updated. Precautions posted in room, chart.      HPI HPI: Pt is a 73 y.o. 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  At baseline, she lives independently and independently complete ADLs.     She presented from home to the ED on 07/14/2024 with shortness of breath which has been gradually worsening for weeks but significantly worsened yesterday.  Patient states that she has had mild exertional dyspnea gradually worsening over the past few weeks.  Starting yesterday, she was unable to speak in full sentences or take deep breaths.  She has had excessive cough without any exudate.  Patient states that she has had mild exertional dyspnea gradually worsening over the past few weeks but that she has been able to eat her meals w/out difficulty.  Patient was noted to be 84% on room air at admit.  She denies typically being on any oxygen.   She does report some decreased p.o. intake just secondary to not being hungry or thirsty.  Post admit, left-sided thoracentesis performed.  Pt remains on [place] O2 support.   CT Imaging of Chest: Moderate bilateral pleural effusions, left greater  than right. There is associated partial compressive atelectasis of  the lower lobes. There is background of Emphysema.      SLP Plan  All goals met        Swallow Evaluation Recommendations   Recommendations: PO diet PO Diet Recommendation: Dysphagia 3 (Mechanical soft);Thin liquids (Level 0) Liquid Administration via: Cup;No straw Medication Administration: Whole meds with puree (vs need to Crush in Puree but NOT w/ liquids) Supervision: Patient able to self-feed;Set-up assistance for safety;Intermittent supervision/cueing for swallowing strategies Postural changes: Position pt fully upright for meals;Stay upright 30-60 min after meals;Out of bed for meals Oral care recommendations: Oral care BID (2x/day);Pt independent with oral care Recommended consults: Consider dietitian consultation;Consider Palliative  care (ongoing)     Recommendations   F/u at next venue of care if needs indicate; Support at meals d/t deconditioning. General aspiration precautions.                   Oral care BID;Staff/trained caregiver to provide oral care;Patient independent with oral care (setup)   Intermittent Supervision/Assistance (d/t deconditioning/weakness) Dysphagia, unspecified (R13.10) (in setting of Declined Pulmonary status w/ increased WOB w/ ANY exertion/tasks; deconditioning; poor appetite)     All goals met       Comer Portugal, MS, CCC-SLP Speech Language Pathologist Rehab Services; [facility] - [place] [phone] (ascom) Carnell Beavers  07/23/2024, 4:20 PM

## 2024-07-23 NOTE — Progress Notes (Signed)
 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  On 11/25 she underwent IR guided left thoracentesis.  Pulmonology was also consulted. Patient was started on prednisone  and began to wean from high flow nasal cannula.  Still very dyspneic and moving very slowly with therapy services. Now awaiting SNF placement.    Subjective: Patient seen and examined at bedside this morning still on 2 L of intranasal oxygen Denies nausea vomiting abdominal pain chest pain cough      Examination: General exam: Appears calm and comfortable, NAD, appears frail Respiratory system: Improvement in aeration bilaterally, 2L [place] in place    Cardiovascular system: S1 & S2 heard, RRR.  Gastrointestinal system: Abdomen is nondistended, soft and nontender.  Neuro: Alert and oriented. No focal neurological deficits. Extremities: Symmetric, expected ROM Skin: No rashes, lesions Psychiatry: Mood & affect appropriate for situation.    Assessment & Plan:   Hypoxic respiratory failure Bilateral pleural effusions - This is multifactorial due to sarcoidosis, and likely component of COPD - Continue to wean down oxygen as tolerated - May require oxygen at baseline though with continued therapy at SNF she may able to wean entirely before discharging home. - Suspect much of her O2 requirement now is due to shallow respirations and atelectasis as she is very deconditioned. - Continue aggressive PT/OT, pending SNF placement - Initial effusions on arrival left greater than right.  No PE on CTA -  Diagnostic and therapeutic thoracentesis 11/25.  Gram stain negative, culture negative.  LD very elevated, cyto path negative.  Clinical picture most consistent with Sarcoid Associated pulmonary effusions - Received 5 total days Methylpred/prednisone : 11/27- 12/1 - No infectious signs or symptoms - Negative RVP - Continue with breathing treatments - Pulmonology consulted, added Breo Ellipta  11/26 - Status post IV diuresis - Echo with EF 60 to 65%, no regional wall motion abnormalities, LV diastolic parameters normal.  No significant valvular abnormalities   Sarcoidosis - Pulmonary consult to Dr. Fernand - On [place], as needed DuoNebs - Patient reports prior to this she was mostly asymptomatic - s/p 5 days steroid therapy.  Significant improvement since initiation   History of low back pain Limb pain - Continue home dose duloxetine  and gabapentin  - Spinal cord stimulator in place - PT/OT recommending SNF.  TOC aware.   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 - Continues to improve with diuretic use - Continue to trend CMP   Sinus tachycardia, chronic - Resolved.  Continue home digoxin    PAD CAD Hypertension - Resume losartan  as BP tolerates.  Currently requiring diuretics   Status post right AKA - Continue use of prosthetic leg   COPD - Does not carry official diagnosis but there is evidence of emphysema on CTA - Has 50-pack-year smoking, quit 30 years ago - Pulmonary consult as above, will need outpatient follow-up for PFTs.  Has been started on Breo   High risk aspiration secondary to increased work of breathing - Speech therapy working with patient.  Continue with dysphagia diet   Body mass index is  23.28 kg/m. Vitals:   07/23/24 0651 07/23/24 1232 07/23/24 1245 07/23/24 1406  BP:    (!) 142/89  Pulse: 87   98  Resp:      Temp:      TempSrc:      SpO2: 94% 93% 92% 92%  Weight:           Latest Ref Rng & Units 07/22/2024    7:44 AM 07/21/2024    5:44 AM 07/20/2024    5:49 AM  CBC  WBC 4.0 - 10.5 K/uL 19.4  14.5  16.4   Hemoglobin 12.0 - 15.0 g/dL 86.2  86.2  86.4   Hematocrit 36.0 - 46.0 % 41.4  42.0  40.0   Platelets 150 - 400 K/uL 323  323  327        Latest Ref Rng & Units 07/22/2024    5:08 AM 07/21/2024    5:44 AM 07/20/2024    5:49 AM  BMP  Glucose 70 - 99 mg/dL 876  890  767   BUN 8 - 23 mg/dL 37  41  28   Creatinine 0.44 - 1.00 mg/dL 9.49  9.27  9.37   Sodium 135 - 145 mmol/L 136  138  136   Potassium 3.5 - 5.1 mmol/L 4.7  4.1  4.2   Chloride 98 - 111 mmol/L 99  99  96   CO2 22 - 32 mmol/L 29  31  30    Calcium  8.9 - 10.3 mg/dL 8.5  8.7  8.9      Author: Drue ONEIDA Potter, MD 07/23/2024 4:57 PM  For on call review [email or web address].

## 2024-07-23 NOTE — Progress Notes (Signed)
 Nutrition Follow-up  DOCUMENTATION CODES:   Non-severe (moderate) malnutrition in context of chronic illness  INTERVENTION:   -Continue dysphagia 3 diet -MVI with minerals daily -Continue Ensure Plus High Protein po TID, each supplement provides 350 kcal and 20 grams of protein  -Continue Magic cup TID with meals, each supplement provides 290 kcal and 9 grams of protein   NUTRITION DIAGNOSIS:   Moderate Malnutrition related to chronic illness (sarcoidosis) as evidenced by mild fat depletion, moderate fat depletion, mild muscle depletion, moderate muscle depletion.  Ongoing  GOAL:   Patient will meet greater than or equal to 90% of their needs  Progressing   MONITOR:   PO intake, Supplement acceptance, Diet advancement  REASON FOR ASSESSMENT:   Consult Assessment of nutrition requirement/status  ASSESSMENT:   73 y.o. 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  11/24- s/p left thoracentesis (0.4 L removed)  11/25- s/p BSE- dysphagia 3 diet   Reviewed I/O's: +300 ml x 24 hours and -200 ml x 24 hours  Patient working with physical therapy at time of visit. Noted patient to be more alert and interactive in comparison to previous visit.   Case discussed with RN, who reports patient is making good progress. Patient is eating well and stated that patient reports that she was hungry for breakfast. Noted documented meal completions 50-90%. Patient has also been drinking her Ensure supplements per RN.   No new weight since last visit.   Per palliative care notes, goals of care are confirmed for DNR/DNI.   Per TOC notes, patient awaiting SNF placement for discharge.   Medications reviewed and include miralax .   Labs reviewed.    Diet Order:   Diet Order             DIET DYS 3 Room service  appropriate? Yes with Assist; Fluid consistency: Thin  Diet effective now                   EDUCATION NEEDS:   Education needs have been addressed  Skin:  Skin Assessment: Reviewed RN Assessment  Last BM:  07/23/24  Height:   Ht Readings from Last 1 Encounters:  06/16/24 5' 3 (1.6 m)    Weight:   Wt Readings from Last 1 Encounters:  07/14/24 59.6 kg    Ideal Body Weight:  48.1 kg (adjusted for right AKA)  BMI:  Body mass index is 23.28 kg/m.  Estimated Nutritional Needs:   Kcal:  1500-1700  Protein:  75-90 grams  Fluid:  1.5-1.7 L    Margery ORN, RD, LDN, CDCES Registered Dietitian III Certified Diabetes Care and Education Specialist If unable to reach this RD, please use RD Inpatient group chat on secure chat between hours of 8am-4 pm daily

## 2024-07-23 NOTE — Plan of Care (Signed)
   Problem: Clinical Measurements: Goal: Respiratory complications will improve Outcome: Progressing   Problem: Activity: Goal: Risk for activity intolerance will decrease Outcome: Progressing   Problem: Safety: Goal: Ability to remain free from injury will improve Outcome: Progressing

## 2024-07-24 ENCOUNTER — Inpatient Hospital Stay

## 2024-07-24 LAB — CBC WITH DIFFERENTIAL/PLATELET
Abs Immature Granulocytes: 0.87 K/uL — ABNORMAL HIGH (ref 0.00–0.07)
Basophils Absolute: 0.1 K/uL (ref 0.0–0.1)
Basophils Relative: 0 %
Eosinophils Absolute: 0.1 K/uL (ref 0.0–0.5)
Eosinophils Relative: 1 %
HCT: 44.4 % (ref 36.0–46.0)
Hemoglobin: 14.4 g/dL (ref 12.0–15.0)
Immature Granulocytes: 4 %
Lymphocytes Relative: 5 %
Lymphs Abs: 1.1 K/uL (ref 0.7–4.0)
MCH: 31.6 pg (ref 26.0–34.0)
MCHC: 32.4 g/dL (ref 30.0–36.0)
MCV: 97.4 fL (ref 80.0–100.0)
Monocytes Absolute: 1.1 K/uL — ABNORMAL HIGH (ref 0.1–1.0)
Monocytes Relative: 5 %
Neutro Abs: 18.3 K/uL — ABNORMAL HIGH (ref 1.7–7.7)
Neutrophils Relative %: 85 %
Platelets: 273 K/uL (ref 150–400)
RBC: 4.56 MIL/uL (ref 3.87–5.11)
RDW: 13.9 % (ref 11.5–15.5)
WBC: 21.6 K/uL — ABNORMAL HIGH (ref 4.0–10.5)
nRBC: 0 % (ref 0.0–0.2)

## 2024-07-24 LAB — BASIC METABOLIC PANEL WITH GFR
Anion gap: 8 (ref 5–15)
BUN: 17 mg/dL (ref 8–23)
CO2: 25 mmol/L (ref 22–32)
Calcium: 8.2 mg/dL — ABNORMAL LOW (ref 8.9–10.3)
Chloride: 98 mmol/L (ref 98–111)
Creatinine, Ser: 0.49 mg/dL (ref 0.44–1.00)
GFR, Estimated: >60 mL/min (ref >60)
Glucose, Bld: 142 mg/dL — ABNORMAL HIGH (ref 70–99)
Potassium: 4.6 mmol/L (ref 3.5–5.1)
Sodium: 131 mmol/L — ABNORMAL LOW (ref 135–145)

## 2024-07-24 LAB — MISC LABCORP TEST (SEND OUT)
LabCorp test name: 5367
Labcorp test code: 9985

## 2024-07-24 NOTE — Progress Notes (Signed)
 Occupational Therapy Treatment Patient Details Name: Sara [name] MRN: [id number] DOB: 05/12/1951 Today's Date: 07/24/2024   History of present illness Sara [name] is a 73 y.o. 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  Presented to ER secondary to persistent cough, SOB; admitted for management of acute hypoxic respiratory failure secondary to bilat pleural effusion, sarcoidosis.  Hospital course significant for thoracentesis (11/24) with removed.   OT comments  Patient seen for OT treatment on this date. Upon arrival to room patient resting in bed, agreeable to treatment. Patient transitioned to EOB without A but increased effort needed. OT instructed on LPT vs stand pivot tx, patient with good return demo and transferred to [facility] to perform toileting with CGA. Patient then transferred to recliner with similar technique with CGA/SBA.  Patient ended treatment in recliner with bed/chair alarm on and all needs within reach. Patient making good progress toward goals, will continue to follow POC. Discharge recommendation remains appropriate.        If plan is discharge home, recommend the following:  A lot of help with walking and/or transfers;A lot of help with bathing/dressing/bathroom;Direct supervision/assist for medications management;Direct supervision/assist for financial management;Assist for transportation;Help with stairs or ramp for entrance   Equipment Recommendations  BSC/3in1    Recommendations for Other Services      Precautions / Restrictions Precautions Precautions: Fall Recall of Precautions/Restrictions: Intact Precaution/Restrictions Comments: R AKA Restrictions Weight Bearing Restrictions Per Provider Order: No       Mobility Bed Mobility Overal bed mobility: Modified  Independent Bed Mobility: Supine to Sit     Supine to sit: Modified independent (Device/Increase time)          Transfers Overall transfer level: Needs assistance   Transfers: Bed to chair/wheelchair/BSC Sit to Stand: Contact guard assist Stand pivot transfers: Supervision, Contact guard assist         General transfer comment: instructed on proper transfer technique with good return demo, transferred from EOB to [facility] and then [facility] to recliner     Balance Overall balance assessment: Needs assistance, Mild deficits observed, not formally tested Sitting-balance support: Single extremity supported Sitting balance-Leahy Scale: Good Sitting balance - Comments: close sup for optimal safety                                   ADL either performed or assessed with clinical judgement   ADL                           Toilet Transfer: Contact guard assist;BSC/3in1 Toilet Transfer Details (indicate cue type and reason): cues for transfer technique Toileting- Clothing Manipulation and Hygiene: Contact guard assist;Sitting/lateral lean              Extremity/Trunk Insurance Account Manager Communication Communication: No apparent difficulties   Cognition Arousal: Alert Behavior During Therapy: Anxious, WFL for tasks assessed/performed Cognition: No apparent impairments  Following commands: Intact        Cueing   Cueing Techniques: Verbal cues, Tactile cues  Exercises      Shoulder Instructions       General Comments      Pertinent Vitals/ Pain       Pain Assessment Pain Assessment: No/denies pain  Home Living                                          Prior Functioning/Environment              Frequency  Min 2X/week        Progress Toward Goals  OT Goals(current goals can now be found in the care plan  section)  Progress towards OT goals: Progressing toward goals  Acute Rehab OT Goals Patient Stated Goal: to go home OT Goal Formulation: With patient Time For Goal Achievement: 07/30/24 Potential to Achieve Goals: Good ADL Goals Pt Will Perform Grooming: with modified independence;sitting Pt Will Perform Lower Body Dressing: with modified independence;sit to/from stand Pt Will Transfer to Toilet: with modified independence;bedside commode  Plan      Co-evaluation                 AM-PAC OT 6 Clicks Daily Activity     Outcome Measure   Help from another person eating meals?: A Little Help from another person taking care of personal grooming?: A Little Help from another person toileting, which includes using toliet, bedpan, or urinal?: Total Help from another person bathing (including washing, rinsing, drying)?: A Lot Help from another person to put on and taking off regular upper body clothing?: A Little Help from another person to put on and taking off regular lower body clothing?: A Lot 6 Click Score: 14    End of Session Equipment Utilized During Treatment: Oxygen  OT Visit Diagnosis: Muscle weakness (generalized) (M62.81);Unsteadiness on feet (R26.81)   Activity Tolerance Patient tolerated treatment well   Patient Left in chair;with call bell/phone within reach;with chair alarm set   Nurse Communication          Time: [phone] OT Time Calculation (min): 19 min  Charges: OT General Charges $OT Visit: 1 Visit OT Treatments $Self Care/Home Management : 8-22 mins  Rogers Clause, OT/L MSOT, 07/24/2024

## 2024-07-24 NOTE — Progress Notes (Signed)
 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  On 11/25 she underwent IR guided left thoracentesis.  Pulmonology was also consulted. Patient was started on prednisone  and began to wean from high flow nasal cannula.  Still very dyspneic and moving very slowly with therapy services. Now awaiting SNF placement.      Assessment & Plan:    Hypoxic respiratory failure Bilateral pleural effusions - This is multifactorial due to sarcoidosis, and likely component of COPD - Continue to wean down oxygen as tolerated - May require oxygen at baseline though with continued therapy at SNF she may able to wean entirely before discharging home. - Suspect much of her O2 requirement now is due to shallow respirations and atelectasis as she is very deconditioned. - Continue aggressive PT/OT, pending SNF placement - Initial effusions on arrival left greater than right.  No PE on CTA - Diagnostic and therapeutic thoracentesis 11/25.  Gram stain negative, culture negative.  LD very elevated, cyto path negative.  Clinical picture most consistent with Sarcoid Associated pulmonary effusions - Received 5 total days Methylpred/prednisone : 11/27- 12/1 - No infectious signs or symptoms - Negative RVP - Continue with breathing treatments - Pulmonology consulted, added Breo Ellipta  11/26 - Status post IV diuresis - Echo with EF 60 to 65%, no regional wall motion abnormalities, LV diastolic parameters normal.  No significant valvular abnormalities   Mild fever in the setting of  leukocytosis Patient did have a temperature spike at 200.5 and WBC 21 Patient has had recent steroid use It to be reasonable to watch patient overnight to ensure there is no underlying infectious etiology Follow-up on urinalysis and chest x-ray  Sarcoidosis - Pulmonary consult to Dr. Fernand - On [place], as needed DuoNebs - Patient reports prior to this she was mostly asymptomatic - s/p 5 days steroid therapy.  Significant improvement since initiation   History of low back pain Limb pain - Continue home dose duloxetine  and gabapentin  - Spinal cord stimulator in place - PT/OT recommending SNF.  TOC aware.   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 - Continues to improve with diuretic use - Continue to trend CMP   Sinus tachycardia, chronic - Resolved.  Continue home digoxin    PAD CAD Hypertension Continue losartan  as BP tolerates.   Currently requiring diuretics   Status post right AKA - Continue use of prosthetic leg   COPD - Does not carry official diagnosis but there is evidence of emphysema on CTA - Has 50-pack-year smoking, quit 30 years ago - Pulmonary consult as above, will need outpatient follow-up for PFTs.  Has been started on Breo   High risk aspiration secondary to increased work of breathing - Speech therapy working with patient.  Continue with dysphagia diet Body mass index is 23.28 kg/m.  Physical examination General exam: Appears calm and comfortable, NAD, appears frail Respiratory system: Improvement in aeration bilaterally, 2L [place] in place    Cardiovascular system: S1 & S2 heard, RRR.  Gastrointestinal system: Abdomen is nondistended, soft and nontender.  Neuro: Alert and  oriented. No focal neurological deficits. Extremities: Symmetric, expected ROM Skin: No rashes, lesions Psychiatry: Mood & affect appropriate for situation.     Subjective: Patient seen and examined at  bedside this morning still on 2 L of intranasal oxygen Patient denies nausea vomiting abdominal pain chest pain cough Patient had a temperature spike overnight    Data Reviewed:    Latest Ref Rng & Units 07/24/2024    7:32 AM 07/22/2024    7:44 AM 07/21/2024    5:44 AM  CBC  WBC 4.0 - 10.5 K/uL 21.6  19.4  14.5   Hemoglobin 12.0 - 15.0 g/dL 85.5  86.2  86.2   Hematocrit 36.0 - 46.0 % 44.4  41.4  42.0   Platelets 150 - 400 K/uL 273  323  323        Latest Ref Rng & Units 07/24/2024    7:58 AM 07/22/2024    5:08 AM 07/21/2024    5:44 AM  BMP  Glucose 70 - 99 mg/dL 857  876  890   BUN 8 - 23 mg/dL 17  37  41   Creatinine 0.44 - 1.00 mg/dL 9.50  9.49  9.27   Sodium 135 - 145 mmol/L 131  136  138   Potassium 3.5 - 5.1 mmol/L 4.6  4.7  4.1   Chloride 98 - 111 mmol/L 98  99  99   CO2 22 - 32 mmol/L 25  29  31    Calcium  8.9 - 10.3 mg/dL 8.2  8.5  8.7       Vitals:   07/24/24 0617 07/24/24 0750 07/24/24 1151 07/24/24 1530  BP:  133/68  118/62  Pulse:  98  77  Resp:  18  16  Temp:  (!) 100.5 F (38.1 C) 99.3 F (37.4 C) 98.1 F (36.7 C)  TempSrc:  Oral  Oral  SpO2: 94% 92%  92%  Weight:         Author: Drue ONEIDA Potter, MD 07/24/2024 4:58 PM  For on call review [email or web address].

## 2024-07-24 NOTE — Plan of Care (Signed)
  Problem: Clinical Measurements: Goal: Ability to maintain clinical measurements within normal limits will improve Outcome: Progressing  Problem: Pain Managment: Goal: General experience of comfort will improve and/or be controlled Outcome: Progressing  Problem: Safety: Goal: Ability to remain free from injury will improve Outcome: Progressing   Problem: Coping: Goal: Level of anxiety will decrease Outcome: Progressing

## 2024-07-24 NOTE — Plan of Care (Signed)
  Problem: Education: Goal: Knowledge of General Education information will improve Description: Including pain rating scale, medication(s)/side effects and non-pharmacologic comfort measures 07/24/2024 1728 by Joshua Maurilio SQUIBB, RN Outcome: Progressing 07/24/2024 1728 by Joshua Maurilio SQUIBB, RN Outcome: Progressing   Problem: Health Behavior/Discharge Planning: Goal: Ability to manage health-related needs will improve 07/24/2024 1728 by Joshua Maurilio SQUIBB, RN Outcome: Progressing 07/24/2024 1728 by Joshua Maurilio SQUIBB, RN Outcome: Progressing   Problem: Clinical Measurements: Goal: Ability to maintain clinical measurements within normal limits will improve 07/24/2024 1728 by Joshua Maurilio SQUIBB, RN Outcome: Progressing 07/24/2024 1728 by Joshua Maurilio SQUIBB, RN Outcome: Progressing Goal: Will remain free from infection 07/24/2024 1728 by Joshua Maurilio SQUIBB, RN Outcome: Progressing 07/24/2024 1728 by Joshua Maurilio SQUIBB, RN Outcome: Progressing Goal: Diagnostic test results will improve 07/24/2024 1728 by Joshua Maurilio SQUIBB, RN Outcome: Progressing 07/24/2024 1728 by Joshua Maurilio SQUIBB, RN Outcome: Progressing Goal: Respiratory complications will improve 07/24/2024 1728 by Joshua Maurilio SQUIBB, RN Outcome: Progressing 07/24/2024 1728 by Joshua Maurilio SQUIBB, RN Outcome: Progressing Goal: Cardiovascular complication will be avoided 07/24/2024 1728 by Joshua Maurilio SQUIBB, RN Outcome: Progressing 07/24/2024 1728 by Joshua Maurilio SQUIBB, RN Outcome: Progressing   Problem: Activity: Goal: Risk for activity intolerance will decrease 07/24/2024 1728 by Joshua Maurilio SQUIBB, RN Outcome: Progressing 07/24/2024 1728 by Joshua Maurilio SQUIBB, RN Outcome: Progressing   Problem: Nutrition: Goal: Adequate nutrition will be maintained 07/24/2024 1728 by Joshua Maurilio SQUIBB, RN Outcome: Progressing 07/24/2024 1728 by Joshua Maurilio SQUIBB, RN Outcome: Progressing   Problem: Coping: Goal: Level of anxiety will decrease 07/24/2024 1728 by Joshua Maurilio SQUIBB, RN Outcome: Progressing 07/24/2024 1728 by Joshua Maurilio SQUIBB, RN Outcome: Progressing   Problem: Elimination: Goal: Will not experience complications related to bowel motility 07/24/2024 1728 by Joshua Maurilio SQUIBB, RN Outcome: Progressing 07/24/2024 1728 by Joshua Maurilio SQUIBB, RN Outcome: Progressing Goal: Will not experience complications related to urinary retention 07/24/2024 1728 by Joshua Maurilio SQUIBB, RN Outcome: Progressing 07/24/2024 1728 by Joshua Maurilio SQUIBB, RN Outcome: Progressing   Problem: Pain Managment: Goal: General experience of comfort will improve and/or be controlled 07/24/2024 1728 by Joshua Maurilio SQUIBB, RN Outcome: Progressing 07/24/2024 1728 by Joshua Maurilio SQUIBB, RN Outcome: Progressing   Problem: Safety: Goal: Ability to remain free from injury will improve 07/24/2024 1728 by Joshua Maurilio SQUIBB, RN Outcome: Progressing 07/24/2024 1728 by Joshua Maurilio SQUIBB, RN Outcome: Progressing   Problem: Skin Integrity: Goal: Risk for impaired skin integrity will decrease 07/24/2024 1728 by Joshua Maurilio SQUIBB, RN Outcome: Progressing 07/24/2024 1728 by Joshua Maurilio SQUIBB, RN Outcome: Progressing

## 2024-07-24 NOTE — Progress Notes (Signed)
 Daily Progress Note   Patient Name: Sara [name]       Date: 07/24/2024 DOB: 1950-11-24  Age: 73 y.o. MRN#: [id number] Attending Physician: Dorinda Drue DASEN, MD Primary Care Physician: [facility]mit Date: 07/14/2024  Reason for Consultation/Follow-up: Establishing goals of care  Subjective: PMT has been following for needs and for decline.  Notes and labs reviewed.  And to see patient.  She is currently resting in bed at this time with breakfast at bedside.  She states she is not hungry.  Discussed the importance of nutrition and hydration for healing.  Discussed small frequent meals and prioritizing protein.  Discussed the importance of incentive spirometry.  Patient is grateful for the visit and hopeful for SNF placement.  Length of Stay: 10  Current Medications: Scheduled Meds:   apixaban   2.5 mg Oral BID   digoxin   0.125 mg Oral Daily   DULoxetine   60 mg Oral BID   feeding supplement  237 mL Oral TID BM   fluticasone  furoate-vilanterol  1 puff Inhalation Daily   ivabradine   7.5 mg Oral BID WC   multivitamin with minerals  1 tablet Oral Daily   polyethylene glycol  17 g Oral Daily    Continuous Infusions:   PRN Meds: acetaminophen  **OR** acetaminophen , ALPRAZolam, bisacodyl , ipratropium-albuterol , melatonin, ondansetron  **OR** ondansetron  (ZOFRAN ) IV  Physical Exam Pulmonary:     Effort: Pulmonary effort is normal.  Skin:    General: Skin is warm and dry.  Neurological:     Mental Status: She is alert.             Vital Signs: BP 133/68 (BP Location: Left Arm)   Pulse 98   Temp 99.3 F (37.4 C)   Resp 18   Wt 59.6 kg   SpO2 92%   BMI 23.28 kg/m  SpO2: SpO2: 92 % O2 Device: O2 Device: Nasal Cannula O2 Flow Rate: O2 Flow Rate (L/min): 3  L/min  Intake/output summary:  Intake/Output Summary (Last 24 hours) at 07/24/2024 1342 Last data filed at 07/23/2024 1700 Gross per 24 hour  Intake 0 ml  Output --  Net 0 ml   LBM: Last BM Date : 07/23/24 Baseline Weight: Weight: 59.6 kg Most recent weight: Weight: 59.6 kg  Patient Active Problem List   Diagnosis Date Noted  Malnutrition of moderate degree 07/16/2024   Pleural effusion 07/14/2024   Moderate episode of recurrent major depressive disorder (HCC) 06/16/2024   Hand pain, not arthralgia, right 05/16/2024   Right leg pain 12/26/2023   Concussion 12/19/2023   Sleep disorder 12/19/2023   At risk for falling 12/19/2023   At risk for polypharmacy 12/19/2023   Insomnia 12/19/2023   Traumatic ecchymosis 12/19/2023   TIA (transient ischemic attack) 12/12/2023   S/P insertion of spinal cord stimulator 10/08/2023   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) 08/03/2023   Neuropathic pain 05/22/2023   Elevated liver enzymes 01/01/2023   Prediabetes 01/01/2023   Polyp of descending colon 11/13/2022   Sinus tachycardia 10/24/2022   Inappropriate sinus tachycardia 10/24/2022   Thoracic facet joint syndrome 06/08/2022   Bone lesion 06/08/2022   IgA monoclonal gammopathy 05/18/2022   Degeneration of lumbar intervertebral disc 01/04/2022   Shoulder pain 01/04/2022   Pain in joint of right shoulder 01/04/2022   Spasm of back muscles 01/04/2022   Thoracic back pain 01/04/2022   History of left above knee amputation [facility]) (Dec 2021) 12/27/2021   Rotator cuff arthropathy of right shoulder 12/27/2021   Hx of rotator cuff surgery 12/27/2021   Chronic pain syndrome 12/27/2021   Adhesive capsulitis of right shoulder 12/05/2021   Phantom limb pain (HCC) 11/10/2021   Muscle spasm 09/09/2020   S/P AKA (above knee amputation), right (HCC) 09/08/2020   Ischemia of extremity 07/02/2020   Chronic pain in right shoulder 11/25/2019   Encounter for long-term (current) use of  high-risk medication 07/16/2019   GCA (giant cell arteritis) (HCC) 07/16/2019   Temporal arteritis (HCC) 07/13/2019   Postoperative wound infection 06/23/2019   Ischemic leg 05/31/2019   Atherosclerosis of native arteries of extremity with intermittent claudication 05/30/2019   Depression 05/29/2019   Eczema of lower extremity 03/04/2018   Chronic venous insufficiency 03/02/2018   History of colonic polyps    Family history of colonic polyps    Benign neoplasm of ascending colon    Mitral valve insufficiency 02/08/2017   Dyspnea on exertion 02/07/2017   Non-rheumatic mitral regurgitation 02/07/2017   Precordial pain 02/07/2017   CAD (coronary artery disease) 12/21/2016   Primary hypertension 12/21/2016   Compression fracture of lumbar spine, non-traumatic (HCC) 04/26/2016   Compression fracture of L3 vertebra (HCC) 04/20/2016   PVD (peripheral vascular disease) 02/24/2016   Family history of premature CAD 02/24/2016   Other specified disease of esophagus    Hiatal hernia    Ischemia of lower extremity    Arterial occlusion (HCC)    Atherosclerosis of aorta (HCC)    History of smoking    Hyperlipidemia    Pain in the chest    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 02/05/2016   Carotid artery stenosis 01/04/2016   Clinical depression 09/06/2015   Angiopathy, peripheral 09/06/2015   Atherosclerosis of native arteries of extremity with rest pain (HCC) 09/06/2015   Chronic recurrent major depressive disorder 03/09/2015   Peripheral blood vessel disorder 03/09/2015   GERD (gastroesophageal reflux disease) 12/21/2014   Carotid artery narrowing 01/28/2014   Peripheral arterial occlusive disease 06/08/2011   Occlusion and stenosis of unspecified carotid artery 06/08/2011   PAD (peripheral artery disease) 06/08/2011    Palliative Care Assessment & Plan    Recommendations/Plan: Patient awaiting SNF placement. PMT will shadow with visits as needed, as goals  are set for DNR/DNI.  Code Status:    Code Status Orders  (From  admission, onward)           Start     Ordered   07/14/24 0926  Do not attempt resuscitation (DNR)- Limited -Do Not Intubate (DNI)  (Code Status)  Continuous       Question Answer Comment  If pulseless and not breathing No CPR or chest compressions.   In Pre-Arrest Conditions (Patient Is Breathing and Has A Pulse) Do not intubate. Provide all appropriate non-invasive medical interventions. Avoid ICU transfer unless indicated or required.   Consent: Discussion documented in EHR or advanced directives reviewed      07/14/24 0925           Code Status History     Date Active Date Inactive Code Status Order ID Comments User Context   07/14/2024 0901 07/14/2024 0925 Full Code [id number]  Lenon Marien CROME, MD ED   12/19/2023 1518 12/20/2023 1653 Full Code [id number]  Cox, Amy LOISE, DO ED   12/12/2023 1943 12/12/2023 1947 Full Code [id number]  Cleatus Delayne GAILS, MD ED   08/11/2020 2035 08/16/2020 2138 Full Code [id number]  Schnier, Cordella MATSU, MD Inpatient   07/02/2020 1520 07/04/2020 1846 Full Code [id number]  Hildegarde Suzen LABOR, PA-C Inpatient   05/30/2019 1435 06/07/2019 1956 Full Code [id number]  Jama Cordella MATSU, MD Inpatient   02/09/2016 1813 02/15/2016 1849 Full Code [id number]  Jama Cordella MATSU, MD Inpatient   02/05/2016 1713 02/09/2016 1813 Full Code [id number]  Schnier, Cordella MATSU, MD ED       Camelia Lewis, NP  Please contact Palliative Medicine Team phone at [phone] for questions and concerns.

## 2024-07-25 LAB — CBC WITH DIFFERENTIAL/PLATELET
Abs Immature Granulocytes: 0.34 K/uL — ABNORMAL HIGH (ref 0.00–0.07)
Basophils Absolute: 0.1 K/uL (ref 0.0–0.1)
Basophils Relative: 0 %
Eosinophils Absolute: 0.2 K/uL (ref 0.0–0.5)
Eosinophils Relative: 1 %
HCT: 40.8 % (ref 36.0–46.0)
Hemoglobin: 13.3 g/dL (ref 12.0–15.0)
Immature Granulocytes: 2 %
Lymphocytes Relative: 6 %
Lymphs Abs: 1 K/uL (ref 0.7–4.0)
MCH: 32 pg (ref 26.0–34.0)
MCHC: 32.6 g/dL (ref 30.0–36.0)
MCV: 98.3 fL (ref 80.0–100.0)
Monocytes Absolute: 0.8 K/uL (ref 0.1–1.0)
Monocytes Relative: 5 %
Neutro Abs: 14.8 K/uL — ABNORMAL HIGH (ref 1.7–7.7)
Neutrophils Relative %: 86 %
Platelets: 240 K/uL (ref 150–400)
RBC: 4.15 MIL/uL (ref 3.87–5.11)
RDW: 14.1 % (ref 11.5–15.5)
WBC: 17.2 K/uL — ABNORMAL HIGH (ref 4.0–10.5)
nRBC: 0 % (ref 0.0–0.2)

## 2024-07-25 LAB — BASIC METABOLIC PANEL WITH GFR
Anion gap: 9 (ref 5–15)
BUN: 21 mg/dL (ref 8–23)
CO2: 28 mmol/L (ref 22–32)
Calcium: 8.3 mg/dL — ABNORMAL LOW (ref 8.9–10.3)
Chloride: 99 mmol/L (ref 98–111)
Creatinine, Ser: 0.54 mg/dL (ref 0.44–1.00)
GFR, Estimated: >60 mL/min (ref >60)
Glucose, Bld: 113 mg/dL — ABNORMAL HIGH (ref 70–99)
Potassium: 4.7 mmol/L (ref 3.5–5.1)
Sodium: 136 mmol/L (ref 135–145)

## 2024-07-25 MED ORDER — FLUTICASONE FUROATE-VILANTEROL 100-25 MCG/ACT IN AEPB: 1.0000 | INHALATION_SPRAY | Freq: Every day | RESPIRATORY_TRACT | Status: AC

## 2024-07-25 MED ORDER — POLYETHYLENE GLYCOL 3350 17 G PO PACK: 17.0000 g | PACK | Freq: Every day | ORAL | Status: AC

## 2024-07-25 MED ORDER — MELATONIN 5 MG PO TABS: 5.0000 mg | ORAL_TABLET | Freq: Every evening | ORAL | Status: AC | PRN

## 2024-07-25 NOTE — Plan of Care (Signed)
  Problem: Clinical Measurements: Goal: Ability to maintain clinical measurements within normal limits will improve Outcome: Progressing   Problem: Coping: Goal: Level of anxiety will decrease Outcome: Progressing   Problem: Elimination: Goal: Will not experience complications related to bowel motility Outcome: Progressing   Problem: Safety: Goal: Ability to remain free from injury will improve Outcome: Progressing   Problem: Skin Integrity: Goal: Risk for impaired skin integrity will decrease Outcome: Progressing   Problem: Elimination: Goal: Will not experience complications related to bowel motility Outcome: Progressing

## 2024-07-25 NOTE — TOC Transition Note (Signed)
 Transition of Care [facility]) - Discharge Note   Patient Details  Name: Sara [name] MRN: [id number] Date of Birth: March 23, 1951  [facility] CM/SW Contact:  Daved JONETTA Hamilton, RN Phone Number: 07/25/2024, 12:16 PM   Clinical Narrative:     Patient will DC to: [facility] Anticipated DC date: 07/25/2024 Family notified: by patient Transport by: [place]  Per MD patient ready for DC to [facility]. RN, patient, and facility notified of DC. Discharge Summary sent to facility. RN given number for report. DC packet on chart. Ambulance transport requested for patient.   TOC signing off.   Final next level of care: Skilled Nursing Facility Barriers to Discharge: Barriers Resolved   Patient Goals and CMS Choice     Choice offered to / list presented to : Patient      Discharge Placement              Patient [facility] bed at:  [facility]) Patient to be transferred to facility by: [facility]   Patient and family notified of of transfer: 07/25/24  Discharge Plan and Services Additional resources added to the After Visit Summary for     Discharge Planning Services: CM Consult Post Acute Care Choice: Skilled Nursing Facility                               Social Drivers of Health (SDOH) Interventions SDOH Screenings   Food Insecurity: No Food Insecurity (07/14/2024)  Housing: Low Risk  (07/14/2024)  Transportation Needs: No Transportation Needs (07/14/2024)  Utilities: Not At Risk (07/14/2024)  Depression (PHQ2-9): Low Risk  (05/22/2023)  Financial Resource Strain: Patient Declined (07/16/2023)   Received from [facility]  Social Connections: Socially Isolated (07/14/2024)  Tobacco Use: Medium Risk (06/16/2024)     Readmission Risk Interventions     No data to display

## 2024-07-25 NOTE — Discharge Summary (Signed)
 Physician Discharge Summary   Patient: Sara [name] MRN: [id number] DOB: Jul 07, 1951  Admit date:     07/14/2024  Discharge date: 07/25/24  Discharge Physician: Drue ONEIDA Potter   [facility]P: [facility]   Recommendations at discharge:  Follow-up with a pulmonologist  Discharge Diagnoses:   Hypoxic respiratory failure Bilateral pleural effusions Sarcoidosis History of low back pain Limb pain Mild hyponatremia, resolved Sinus tachycardia, chronic PAD CAD Hypertension Status post right AKA COPD High risk aspiration secondary to increased work of breathing   Hospital Course:   Sara [name] is a 73 year old female with GERD, PAD s/p right AKA, carotid artery stenosis, hyperlipidemia, CAD, PVD, hypertension, CVA, 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  On 11/25 she underwent IR guided left thoracentesis.  Pulmonology was also consulted. Patient was started on prednisone  and began to wean from high flow nasal cannula.  Respiratory function-improving currently patient is currently requiring 2 L of intranasal oxygen   Assessment & Plan:    Hypoxic respiratory failure Bilateral pleural effusions - This is multifactorial due to sarcoidosis, and likely component of COPD - Currently on 2 L of oxygen - Suspect much of her O2 requirement now is due to shallow respirations and atelectasis - Initial effusions on arrival left greater than right.  No PE on CTA - Diagnostic and therapeutic thoracentesis 11/25.  Gram stain negative, culture negative.  LD very elevated, cyto path negative.  Clinical picture most consistent with Sarcoid Associated pulmonary effusions - Received 5 total days Methylpred/prednisone : 11/27- 12/1 - No infectious signs or symptoms - Negative respiratory viral panel - Continue with breathing treatments - Pulmonology  consulted, added Breo Ellipta  11/26 - Status post IV diuresis - Echo with EF 60 to 65%, no regional wall motion abnormalities, LV diastolic parameters normal.  No significant valvular abnormalities     Sarcoidosis - Pulmonary consult to Dr. Fernand - On [place], as needed DuoNebs - Patient reports prior to this she was mostly asymptomatic - s/p 5 days steroid therapy.  Significant improvement since initiation   History of low back pain Limb pain - Continue home dose duloxetine  and gabapentin  - Spinal cord stimulator in place - PT/OT recommending SNF.   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 - Continues to improve with diuretic use - Continue to trend CMP   Sinus tachycardia, chronic - Resolved.  Continue home digoxin    PAD CAD Hypertension We will give antihypertensives as blood pressure allows   Status post right AKA - Continue use of prosthetic leg   COPD - Does not carry official diagnosis but there is evidence of emphysema on CTA - Has 50-pack-year smoking, quit 30 years ago - Outpatient follow-up with pulmonary   High risk aspiration secondary to increased work of breathing - Speech therapy working with patient.  Continue with dysphagia diet Body mass index is 23.28 kg/m.  Consultants: Palliative care Procedures performed: As mentioned above Disposition: Skilled nursing facility Diet recommendation:  Cardiac diet DISCHARGE MEDICATION: Allergies as of 07/25/2024       Reactions   Zolpidem  Anaphylaxis, Other (See Comments)        Medication List     STOP taking these medications    cariprazine  1.5 MG capsule Commonly known as: Vraylar    gabapentin  400 MG tablet Commonly known as: NEURONTIN    losartan  25 MG tablet  Commonly known as: COZAAR        TAKE these medications    apixaban  2.5 MG Tabs tablet Commonly known as: ELIQUIS  Take 2.5 mg by mouth 2 (two) times  daily.   atorvastatin  40 MG tablet Commonly known as: LIPITOR Take 40 mg by mouth at bedtime.   Blood Pressure Cuff Misc 1 Units by Does not apply route daily as needed.   Calcium  Carb-Cholecalciferol  600-10 MG-MCG Tabs Take 1 tablet by mouth 2 (two) times daily.   digoxin  0.125 MG tablet Commonly known as: LANOXIN  Take 0.125 mg by mouth daily.   DULoxetine  60 MG capsule Commonly known as: CYMBALTA  TAKE 1 CAPSULE BY MOUTH TWICE A DAY   fluticasone  furoate-vilanterol 100-25 MCG/ACT Aepb Commonly known as: BREO ELLIPTA  Inhale 1 puff into the lungs daily. Start taking on: July 26, 2024   ivabradine  7.5 MG Tabs tablet Commonly known as: Corlanor  Take 1 tablet (7.5 mg total) by mouth 2 (two) times daily with a meal.   melatonin 5 MG Tabs Take 1 tablet (5 mg total) by mouth at bedtime as needed.   meloxicam  15 MG tablet Commonly known as: MOBIC  Take 1 tablet (15 mg total) by mouth daily.   polyethylene glycol 17 g packet Commonly known as: MIRALAX  / GLYCOLAX  Take 17 g by mouth daily. Start taking on: July 26, 2024   triamcinolone  ointment 0.5 % Commonly known as: KENALOG  Apply 1 Application topically 2 (two) times daily.               Durable Medical Equipment  (From admission, onward)           Start     Ordered   07/18/24 0846  For home use only DME oxygen  Once       Question Answer Comment  Length of Need 12 Months   Mode or (Route) Nasal cannula   Liters per Minute 2   Frequency Continuous (stationary and portable oxygen unit needed)   Oxygen conserving device Yes   Oxygen delivery system: Gas   Oxygen delivery system: Portable concentrator (POC)   Oxygen delivery system: Concentrator      07/18/24 0845            Contact information for follow-up providers     Parris Manna, MD. Call.   Specialty: Pulmonary Disease Contact information: [address] [place] [address] [phone]              Contact  information for after-discharge care     Destination     [facility] .   Service: Skilled Nursing Contact information: [address] [place]  [address] [phone]             Home Medical Care     [facility]) .   Service: Home Health Services                    Discharge Exam: Filed Weights   07/14/24 0411  Weight: 59.6 kg   General exam: Appears calm and comfortable, NAD, appears frail Respiratory system: Improvement in aeration bilaterally, 2L [place] in place    Cardiovascular system: S1 & S2 heard, RRR.  Gastrointestinal system: Abdomen is nondistended, soft and nontender.  Neuro: Alert and oriented. No focal neurological deficits. Extremities: Symmetric, expected ROM Skin: No rashes, lesions Psychiatry: Mood & affect appropriate for situation.  Condition at discharge: good  The results of significant diagnostics from this hospitalization (  including imaging, microbiology, ancillary and laboratory) are listed below for reference.   Imaging Studies: DG Chest Port 1 View Result Date: 07/24/2024 CLINICAL DATA:  Fever. EXAM: PORTABLE CHEST 1 VIEW COMPARISON:  07/14/2024 radiograph and CT FINDINGS: Unchanged elevation of right hemidiaphragm. Bilateral pleural effusions, increased from prior exam. Increased interstitial thickening. Subsegmental opacity at the left lung base. Stable heart size and mediastinal contours. No pneumothorax. Spinal stimulator in place. IMPRESSION: 1. Bilateral pleural effusions, increased from prior exam. 2. Increased interstitial thickening, may be pulmonary edema or atypical infection. Electronically Signed   By: Andrea Gasman M.D.   On: 07/24/2024 20:34   ECHOCARDIOGRAM COMPLETE Result Date: 07/15/2024    ECHOCARDIOGRAM REPORT   Patient Name:   RICHELE STRAND Date of Exam: 07/15/2024 Medical Rec #:  [id number]            Height:       63.0 in Accession #:     [id number]           Weight:       131.4 lb Date of Birth:  09-13-1950             BSA:          1.617 m Patient Age:    73 years             BP:           124/75 mmHg Patient Gender: F                    HR:           76 bpm. Exam Location:  [facility] Procedure: Cardiac Doppler and Color Doppler (Both Spectral and Color Flow            Doppler were utilized during procedure). 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 Signs/Symptoms:Dyspnea; Risk                  Factors:Hypertension.  Sonographer:     Christopher Furnace Referring Phys:  [id number] MARIEN LITTIE PIETY Diagnosing Phys: Annalee Custovic  Sonographer Comments: Technically challenging study due to limited acoustic windows, no apical window and no subcostal window. IMPRESSIONS  1. Left ventricular ejection fraction, by estimation, is 60 to 65%. The left ventricle has normal function. The left ventricle has no regional wall motion abnormalities. Left ventricular diastolic parameters were normal.  2. Right ventricular systolic function is normal. The right ventricular size is normal.  3. The mitral valve is normal in structure. Trivial mitral valve regurgitation. No evidence of mitral stenosis.  4. The aortic valve is normal in structure. Aortic valve regurgitation is not visualized. No aortic stenosis is present.  5. The inferior vena cava is normal in size with greater than 50% respiratory variability, suggesting right atrial pressure of 3 mmHg. FINDINGS  Left Ventricle: Left ventricular ejection fraction, by estimation, is 60 to 65%. The left ventricle has normal function. The left ventricle has no regional wall motion abnormalities. The left ventricular internal cavity size was normal in size. There is  no left ventricular hypertrophy. Left ventricular diastolic parameters were normal. Right Ventricle: The right ventricular size is normal. No increase in  right ventricular wall thickness. Right ventricular systolic function is normal. Left Atrium: Left atrial size  was normal in size. Right Atrium: Right atrial size was normal in size. Pericardium: There is no evidence of pericardial effusion. Mitral Valve: The mitral valve is normal in structure. Trivial mitral valve regurgitation. No evidence of mitral valve stenosis. Tricuspid Valve: The tricuspid valve is normal in structure. Tricuspid valve regurgitation is trivial. Aortic Valve: The aortic valve is normal in structure. Aortic valve regurgitation is not visualized. No aortic stenosis is present. Pulmonic Valve: The pulmonic valve was normal in structure. Pulmonic valve regurgitation is trivial. Aorta: The aortic root is normal in size and structure. Venous: The inferior vena cava is normal in size with greater than 50% respiratory variability, suggesting right atrial pressure of 3 mmHg. IAS/Shunts: No atrial level shunt detected by color flow Doppler.  LEFT VENTRICLE PLAX 2D LVIDd:         3.70 cm LVIDs:         2.50 cm LV PW:         0.80 cm LV IVS:        1.00 cm LVOT diam:     2.00 cm LVOT Area:     3.14 cm  RIGHT VENTRICLE RVOT diam:      3.10 cm  AORTA Ao Root diam: 3.70 cm  SHUNTS Systemic Diam: 2.00 cm Pulmonic Diam: 3.10 cm Annalee Custovic Electronically signed by Annalee Casa Signature Date/Time: 07/15/2024/2:02:49 PM    Final    US  THORACENTESIS ASP PLEURAL SPACE W/IMG GUIDE Result Date: 07/14/2024 INDICATION: [id number] Pleural effusion [id number] 73 year old female presenting to ED with progressively worsening dyspnea on exertion and shortness of breath. EXAM: ULTRASOUND GUIDED DIAGNOSTIC AND THERAPEUTIC THORACENTESIS MEDICATIONS: 5 mL of 1% lidocaine  COMPLICATIONS: None immediate. PROCEDURE: An ultrasound guided thoracentesis was thoroughly discussed with the patient and questions answered. The benefits, risks, alternatives and complications were also discussed. The patient understands and wishes to  proceed with the procedure. Written consent was obtained. Ultrasound was performed to localize and mark an adequate pocket of fluid in the left chest. The area was then prepped and draped in the normal sterile fashion. 1% Lidocaine  was used for local anesthesia. Under ultrasound guidance a 6 Fr Safe-T-Centesis catheter was introduced. Thoracentesis was performed. The catheter was removed and a dressing applied. FINDINGS: A total of approximately 425 mL of clear-straw-colored pleural fluid was removed. Samples were sent to the laboratory as requested by the clinical team. IMPRESSION: Successful ultrasound guided LEFT thoracentesis yielding 425 mL of pleural fluid. Performed by: Carlin Griffon, PA-C under supervision of Thom Hall, MD Electronically Signed   By: Thom Hall M.D.   On: 07/14/2024 16:35   DG Chest Port 1 View Result Date: 07/14/2024 CLINICAL DATA:  Status post thoracentesis EXAM: PORTABLE CHEST 1 VIEW COMPARISON:  Chest x-ray 07/14/2024 FINDINGS: The heart size and mediastinal contours are within normal limits. There is stable elevation of the right hemidiaphragm. There are stable small bilateral pleural effusions. No new focal lung infiltrate or pneumothorax. The visualized skeletal structures are unremarkable. IMPRESSION: Stable small bilateral pleural effusions. No pneumothorax. Electronically Signed   By: Greig Pique M.D.   On: 07/14/2024 15:06   CT Angio Chest PE W and/or Wo Contrast Result Date: 07/14/2024 CLINICAL DATA:  Concern for pulmonary embolism. EXAM: CT ANGIOGRAPHY CHEST WITH CONTRAST TECHNIQUE: Multidetector CT imaging of the chest was performed using the standard protocol during bolus administration of intravenous contrast. Multiplanar CT image reconstructions and MIPs were obtained to evaluate the vascular anatomy. RADIATION DOSE REDUCTION: This exam was performed according to the  departmental dose-optimization program which includes automated exposure control, adjustment of  the mA and/or kV according to patient size and/or use of iterative reconstruction technique. CONTRAST:  75mL OMNIPAQUE  IOHEXOL  350 MG/ML SOLN COMPARISON:  Chest radiograph dated 07/14/2024. FINDINGS: Evaluation of this exam is limited due to respiratory motion. Cardiovascular: There is no cardiomegaly or pericardial effusion. There is mild atherosclerotic calcification of the thoracic aorta. No as well dilatation or dissection. Five evaluation of the pulmonary arteries is limited due to respiratory motion and suboptimal visualization of the peripheral branches. No central pulmonary artery embolus identified. Mediastinum/Nodes: No obvious hilar adenopathy. Subcarinal lymph node measures 14 mm short axis. The esophagus is grossly unremarkable. No mediastinal fluid collection. Lungs/Pleura: Moderate bilateral pleural effusions, left greater than right. There is associated partial compressive atelectasis of the lower lobes. Pneumonia is not excluded follow-up to resolution recommended. There is background of emphysema. Upper Abdomen: No acute abnormality. Musculoskeletal: No acute osseous pathology. Lower thoracic spinal stimulator. Review of the MIP images confirms the above findings. IMPRESSION: 1. No CT evidence of central pulmonary artery embolus. 2. Moderate bilateral pleural effusions, left greater than right. 3. Aortic Atherosclerosis (ICD10-I70.0) and Emphysema (ICD10-J43.9). Electronically Signed   By: Vanetta Chou M.D.   On: 07/14/2024 08:20   DG Chest Port 1 View Result Date: 07/14/2024 EXAM: 1 VIEW(S) XRAY OF THE CHEST 07/14/2024 04:40:40 AM COMPARISON: 12/12/2023 CLINICAL HISTORY: 73 year old female with shortness of breath. FINDINGS: LINES, TUBES AND DEVICES: Neural stimulator leads over lower thoracic spine. LUNGS AND PLEURA: Lower lung volumes. Small left greater than right pleural effusions appear new. Left apical pleural thickening. No pneumothorax. HEART AND MEDIASTINUM: Atherosclerotic  plaque. No acute abnormality of the cardiac and mediastinal silhouettes. BONES AND SOFT TISSUES: No acute osseous abnormality. IMPRESSION: 1. Small left greater than right pleural effusions, new since 12/12/2023. 2. No other acute cardiopulmonary abnormality by portable CXR. Electronically signed by: Helayne Hurst MD 07/14/2024 05:11 AM EST RP Workstation: [id number]    Microbiology: Results for orders placed or performed during the hospital encounter of 07/14/24  Resp panel by RT-PCR (RSV, Flu A&B, Covid) Anterior Nasal Swab     Status: None   Collection Time: 07/14/24  5:02 AM   Specimen: Anterior Nasal Swab  Result Value Ref Range Status   SARS Coronavirus 2 by RT PCR NEGATIVE NEGATIVE Final    Comment: (NOTE) SARS-CoV-2 target nucleic acids are NOT DETECTED.  The SARS-CoV-2 RNA is generally detectable in upper respiratory specimens during the acute phase of infection. The lowest concentration of SARS-CoV-2 viral copies this assay can detect is 138 copies/mL. A negative result does not preclude SARS-Cov-2 infection and should not be used as the sole basis for treatment or other patient management decisions. A negative result may occur with  improper specimen collection/handling, submission of specimen other than nasopharyngeal swab, presence of viral mutation(s) within the areas targeted by this assay, and inadequate number of viral copies(<138 copies/mL). A negative result must be combined with clinical observations, patient history, and epidemiological information. The expected result is Negative.  Fact Sheet for Patients:  bloggercourse.com  Fact Sheet for Healthcare Providers:  seriousbroker.it  This test is no t yet approved or cleared by the [place]  FDA and  has been authorized for detection and/or diagnosis of SARS-CoV-2 by FDA under an Emergency Use Authorization (EUA). This EUA will remain  in effect (meaning this  test can be used) for the duration of the COVID-19 declaration under Section 564(b)(1) of the Act, 21 U.S.C.section 360bbb-3(b)(1),  unless the authorization is terminated  or revoked sooner.       Influenza A by PCR NEGATIVE NEGATIVE Final   Influenza B by PCR NEGATIVE NEGATIVE Final    Comment: (NOTE) The Xpert Xpress SARS-CoV-2/FLU/RSV plus assay is intended as an aid in the diagnosis of influenza from Nasopharyngeal swab specimens and should not be used as a sole basis for treatment. Nasal washings and aspirates are unacceptable for Xpert Xpress SARS-CoV-2/FLU/RSV testing.  Fact Sheet for Patients: bloggercourse.com  Fact Sheet for Healthcare Providers: seriousbroker.it  This test is not yet approved or cleared by the [place]  FDA and has been authorized for detection and/or diagnosis of SARS-CoV-2 by FDA under an Emergency Use Authorization (EUA). This EUA will remain in effect (meaning this test can be used) for the duration of the COVID-19 declaration under Section 564(b)(1) of the Act, 21 U.S.C. section 360bbb-3(b)(1), unless the authorization is terminated or revoked.     Resp Syncytial Virus by PCR NEGATIVE NEGATIVE Final    Comment: (NOTE) Fact Sheet for Patients: bloggercourse.com  Fact Sheet for Healthcare Providers: seriousbroker.it  This test is not yet approved or cleared by the [place]  FDA and has been authorized for detection and/or diagnosis of SARS-CoV-2 by FDA under an Emergency Use Authorization (EUA). This EUA will remain in effect (meaning this test can be used) for the duration of the COVID-19 declaration under Section 564(b)(1) of the Act, 21 U.S.C. section 360bbb-3(b)(1), unless the authorization is terminated or revoked.  Performed at [facility], [address] [place], [place] [address]   Blood culture (routine x 2)      Status: None   Collection Time: 07/14/24  5:49 AM   Specimen: BLOOD  Result Value Ref Range Status   Specimen Description BLOOD BLOOD LEFT ARM  Final   Special Requests   Final    BOTTLES DRAWN AEROBIC AND ANAEROBIC Blood Culture results may not be optimal due to an inadequate volume of blood received in culture bottles   Culture   Final    NO GROWTH 5 DAYS Performed at [facility], [address] [place], [place] [address]    Report Status 07/19/2024 FINAL  Final  Blood culture (routine x 2)     Status: None   Collection Time: 07/14/24  5:49 AM   Specimen: BLOOD  Result Value Ref Range Status   Specimen Description BLOOD BLOOD LEFT ARM  Final   Special Requests   Final    BOTTLES DRAWN AEROBIC AND ANAEROBIC Blood Culture results may not be optimal due to an inadequate volume of blood received in culture bottles   Culture   Final    NO GROWTH 5 DAYS Performed at [facility], [address], [place], [place] [address]    Report Status 07/19/2024 FINAL  Final  Body fluid culture w Gram Stain     Status: None   Collection Time: 07/14/24  2:05 PM   Specimen: PATH Cytology Pleural fluid  Result Value Ref Range Status   Specimen Description   Final    PLEURAL Performed at [facility], [address], [place], [place] [address]    Special Requests   Final    NONE Performed at [facility], [address] [place], [place] [address]    Gram Stain   Final    FEW WBC PRESENT,BOTH PMN AND MONONUCLEAR NO ORGANISMS SEEN    Culture   Final    NO GROWTH 4 DAYS  Performed at [facility], [address], [place], [place] [address]    Report Status 07/18/2024 FINAL  Final    Labs: CBC: Recent Labs  Lab 07/20/24 0549 07/21/24 0544 07/22/24 0744 07/24/24 0732 07/25/24 0620  WBC 16.4* 14.5* 19.4* 21.6* 17.2*  NEUTROABS 12.8* 9.9* 15.0* 18.3* 14.8*  HGB 13.5 13.7 13.7 14.4 13.3  HCT 40.0 42.0 41.4 44.4 40.8  MCV 96.6 98.6 97.2  97.4 98.3  PLT 327 323 323 273 240   Basic Metabolic Panel: Recent Labs  Lab 07/19/24 0446 07/20/24 0549 07/21/24 0544 07/22/24 0508 07/24/24 0758 07/25/24 0620  NA 134* 136 138 136 131* 136  K 4.6 4.2 4.1 4.7 4.6 4.7  CL 94* 96* 99 99 98 99  CO2 29 30 31 29 25 28   GLUCOSE 169* 232* 109* 123* 142* 113*  BUN 30* 28* 41* 37* 17 21  CREATININE 0.66 0.62 0.72 0.50 0.49 0.54  CALCIUM  8.9 8.9 8.7* 8.5* 8.2* 8.3*  MG 2.4 2.3 2.5* 2.4  --   --   PHOS 3.7 3.1 3.4 3.7  --   --    Liver Function Tests: Recent Labs  Lab 07/19/24 0446 07/20/24 0549 07/21/24 0544 07/22/24 0508  AST 55* 40 70* 52*  ALT 52* 46* 96* 100*  ALKPHOS 74 73 71 70  BILITOT 0.2 0.2 <0.2 <0.2  PROT 6.7 6.6 6.4* 5.9*  ALBUMIN 2.9* 2.8* 2.8* 2.9*   CBG: No results for input(s): GLUCAP in the last 168 hours.  Discharge time spent:  37 minutes.  Signed: Drue ONEIDA Potter, MD [facility] 07/25/2024

## 2024-07-25 NOTE — TOC Progression Note (Signed)
 Transition of Care [facility]) - Progression Note    Patient Details  Name: Sara [name] MRN: [id number] Date of Birth: 1951-08-11  [facility] CM/SW Contact  Daved JONETTA Hamilton, RN Phone Number: 07/25/2024, 11:13 AM  Clinical Narrative:     Approved PlanAuthID: [id number] Dates: 12/3-12/12/2023 Next Review Date: 07/25/2024  Expected Discharge Plan: Skilled Nursing Facility Barriers to Discharge: Continued Medical Work up               Expected Discharge Plan and Services   Discharge Planning Services: CM Consult Post Acute Care Choice: Skilled Nursing Facility Living arrangements for the past 2 months: Apartment (lives on 1st floor) Expected Discharge Date: 07/25/24                                     Social Drivers of Health (SDOH) Interventions SDOH Screenings   Food Insecurity: No Food Insecurity (07/14/2024)  Housing: Low Risk  (07/14/2024)  Transportation Needs: No Transportation Needs (07/14/2024)  Utilities: Not At Risk (07/14/2024)  Depression (PHQ2-9): Low Risk  (05/22/2023)  Financial Resource Strain: Patient Declined (07/16/2023)   Received from [facility]  Social Connections: Socially Isolated (07/14/2024)  Tobacco Use: Medium Risk (06/16/2024)    Readmission Risk Interventions     No data to display

## 2024-07-25 NOTE — Progress Notes (Signed)
 I called and gave report to Alisa, Nurse assuming care at [facility] [phone]. [place] will [place].

## 2024-07-26 NOTE — Progress Notes (Signed)
 Speech Language Pathology Treatment:    Patient Details Name: Sara [name] MRN: [id number] DOB: [birth year] Today's Date: 07/26/2024 Time:  -  (Late entry note for 07/16/2024)    Assessment / Plan / Recommendation Clinical Impression  Pt seen for ongoing assessment of toleration of diet and swallowing. Pt appears to present w/ grossly functional oropharyngeal phase swallow when following general aspiration precautions and monitoring her Pulmonary status during oral intake. NSG reported pt is tolerating her current mech soft diet and Pills in Puree.   Pt presents w/ risk for aspiration/dysphagia as well as Reduced oral intake in setting of (currently) declined Pulmonary status and Esophageal phase dysmotility/GERD. Pt also requires support w/ tray setup and sitting up at meals d/t Deconditioned status. Frequent REST BREAKS for conservation of energy required during any/all po intake/tasks d/t WOB/SOB secondary to the exertion increasing respiratory demand/effort.    Pt's dysphagia risk appears secondary to impact from her current Pulmonary status/decline and Deconditioning overall secondary to extended illness and her Baseline Comorbidities. She continues on O2 support(weaning currently). Frequent REST BREAKS w/ any exertion/task aids to ease respiratory demand. ANY such significant Pulmonary decline can impact Apnea timing and airway closure during the swallow which can impact pharyngeal swallowing, airway protection, and increase risk for aspiration to occur thus further Pulmonary impact/decline. Pt also has Baseline dx of GERD; ANY Esophageal phase Dysmotility or Regurgitation of Reflux material can increase risk for aspiration of the Reflux material during Retrograde flow thus impact Voicing and Pulmonary status.    During po's this session, pt fed self sips of thin liquids Via Cup and boluses of soft/moistened solids w/ REST BREAKS b/t trials and careful monitoring of pt's RR. No overt,  clinical s/s of aspiration noted w/ trials given: no cough/throat clearing, no wet vocal quality, and respiratory effort appeared to only minimally increase w/ the effort from the exertion of the tasks/mastication but calmed again (to her baseline) w/ the REST BREAKS b/t trials. Swallowing of liquids was timely- no cough. Oral phase appeared grossly [facility] for trial consistencies given -- solid food trials were broken small and moistened well to lessen exertion of task(mastication) and for conservation of energy. Discussed not using straws in order to have better control of sip size during drinking and to better manage inhalation/exhalation pattern. Pt agreed.  Recommend continue a more Mech Soft diet w/ Meats Minced for the cut/moistened foods to reduce oral phase effort (conservation of energy); Thin liquids VIA CUP in single, small sips. Pt should help to feed self but tray setup and support should be given for conservation of her energy. Must sit fully upright w/ oral intake. Pills given Whole vs Crushed in Puree for ease, safety of swallowing. Aspiration precautions to include Small bites/sips Slowly and frequent REST BREAKS during oral intake w/ close monitoring of RR or increased mouth breathing. GERD/REFLUX precautions.  Palliative Care support is ongoing per chart. Dietician f/u for support. Education given on Pills in Puree; food consistencies and easy to eat options; aspiration precautions to pt.  NSG updated, agreed. MD updated. Precautions posted in room, chart.       HPI  Pt is a 73 y.o. 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 At baseline, she lives independently and independently complete ADLs. She presented from  home to the ED on 07/14/2024 with shortness  of breath which has been gradually worsening for weeks but significantly worsened yesterday. Patient states that she has had mild exertional dyspnea gradually worsening over the past few weeks. Starting yesterday, she was unable to speak in full sentences or take deep breaths. She has had excessive cough without any exudate. Patient states that she has had mild exertional dyspnea gradually worsening over the past few weeks but that she has been able to eat her meals w/out difficulty. Patient was noted to be 84% on room air at admit. She denies typically being on any oxygen. She does report some decreased p.o. intake just secondary to not being hungry or thirsty. Post admit, left-sided thoracentesis performed. Pt remains on [place] O2 support. CT Imaging of Chest: Moderate bilateral pleural effusions, left greater than right. There is associated partial compressive atelectasis of the lower lobes. There is background of Emphysema.       SLP Plan   F/u per POC; ongoing f/u while admitted for continued education and support        Swallow Evaluation Recommendations    General aspiration precautions; support at meals as needed     Recommendations   F/u tbd at D/C                                     (Late entry note)   Comer Portugal, MS, CCC-SLP Speech Language Pathologist Rehab Services; [facility] [place] [phone] (ascom) Nickey Canedo  07/26/2024, 11:57 AM

## 2024-07-28 DIAGNOSIS — F5101 Primary insomnia: Secondary | ICD-10-CM | POA: Diagnosis not present

## 2024-07-28 DIAGNOSIS — F419 Anxiety disorder, unspecified: Secondary | ICD-10-CM | POA: Diagnosis not present

## 2024-07-28 NOTE — Progress Notes (Signed)
 Sara [name]                                          MRN: [id number]   07/28/2024   The [facility] Quality Team Specialist reviewed this patient medical record for the purposes of chart review for care gap closure. The following were reviewed: chart review for care gap closure-controlling blood pressure.    [facility] Quality Team

## 2024-08-11 ENCOUNTER — Ambulatory Visit: Admitting: Cardiology

## 2024-08-11 ENCOUNTER — Encounter: Payer: Self-pay | Admitting: Cardiology

## 2024-08-11 VITALS — BP 121/77 | HR 102 | Ht 63.0 in | Wt 137.0 lb

## 2024-08-11 DIAGNOSIS — D869 Sarcoidosis, unspecified: Secondary | ICD-10-CM | POA: Diagnosis not present

## 2024-08-11 DIAGNOSIS — I4711 Inappropriate sinus tachycardia, so stated: Secondary | ICD-10-CM

## 2024-08-11 DIAGNOSIS — Z131 Encounter for screening for diabetes mellitus: Secondary | ICD-10-CM

## 2024-08-11 DIAGNOSIS — R0609 Other forms of dyspnea: Secondary | ICD-10-CM

## 2024-08-11 DIAGNOSIS — I10 Essential (primary) hypertension: Secondary | ICD-10-CM | POA: Diagnosis not present

## 2024-08-11 DIAGNOSIS — J439 Emphysema, unspecified: Secondary | ICD-10-CM | POA: Diagnosis not present

## 2024-08-11 NOTE — Progress Notes (Signed)
 "  Established Patient Office Visit  Subjective:  Patient ID: Sara [name], female    DOB: Dec 25, 1950  Age: 73 y.o. MRN: [id number]  Chief Complaint  Patient presents with   Follow-up    Hospital follow up    Patient in office for hospital follow up. Patient went to ED on 07/14/24 with complaints of worsening shortness of breath. Patient diagnosed with hypoxic respiratory failure and bilateral pleural effusions. Patient comes in today, no new complaints. Discussed medication list. Patient on O2 via nasal cannula.  Patient does not have a follow up scheduled with pulmonary. Referral sent.  Blood pressure well controlled.  HR elevated, patient reports she has not been taking her digoxin . Will restart.  Continue current medications.     No other concerns at this time.   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 Jama   Vertigo    Last episode approx Aug 2015   Vertigo, benign paroxysmal 12/28/2015   Wears dentures    full upper    Past Surgical History:  Procedure Laterality Date   ADENOIDECTOMY     AMPUTATION Right 08/11/2020   Procedure: AMPUTATION ABOVE KNEE;  Surgeon: Jama Cordella MATSU, MD;  Location: [facility];  Service: Vascular;  Laterality: Right;   ARTERY BIOPSY Right 07/14/2019   Procedure: BIOPSY TEMPORAL ARTERY;  Surgeon: Jama Cordella MATSU,  MD;  Location: [facility];  Service: Vascular;  Laterality: Right;   BREAST CYST ASPIRATION Left    CARDIAC CATHETERIZATION  02/15/2017   [facility]   COLONOSCOPY WITH PROPOFOL  N/A 10/22/2017   Procedure: COLONOSCOPY WITH PROPOFOL ;  Surgeon: Jinny Carmine, MD;  Location: [facility];  Service: Endoscopy;  Laterality: N/A;  specimens not taken--pt on Plavix  will be brought back in after 7 days off med   COLONOSCOPY WITH PROPOFOL  N/A 11/05/2017   Procedure: COLONOSCOPY WITH PROPOFOL ;  Surgeon: Jinny Carmine, MD;  Location: [facility];  Service: Endoscopy;  Laterality: N/A;   COLONOSCOPY WITH PROPOFOL  N/A 11/13/2022   Procedure: COLONOSCOPY WITH PROPOFOL ;  Surgeon: Jinny Carmine, MD;  Location: [facility];  Service: Endoscopy;  Laterality: N/A;   ESOPHAGOGASTRODUODENOSCOPY N/A 12/21/2014   Procedure: ESOPHAGOGASTRODUODENOSCOPY (EGD);  Surgeon: Carmine Jinny, MD;  Location: [facility];  Service: Gastroenterology;  Laterality: N/A;   ESOPHAGOGASTRODUODENOSCOPY (EGD) WITH PROPOFOL  N/A 02/08/2016   Procedure: ESOPHAGOGASTRODUODENOSCOPY (EGD) WITH PROPOFOL ;  Surgeon: Carmine Jinny, MD;  Location: [facility];  Service: Endoscopy;  Laterality: N/A;   FASCIOTOMY Right 05/30/2019   Procedure: FASCIOTOMY;  Surgeon: Jama Cordella MATSU, MD;  Location: [facility];  Service: Vascular;  Laterality: Right;   FASCIOTOMY CLOSURE Right 06/04/2019   Procedure: FASCIOTOMY CLOSURE;  Surgeon: Jama Cordella MATSU, MD;  Location: [facility];  Service: Vascular;  Laterality: Right;  HEMORROIDECTOMY  2014   LOWER EXTREMITY ANGIOGRAPHY Right 05/30/2019   Procedure: LOWER EXTREMITY ANGIOGRAPHY;  Surgeon: Jama Cordella MATSU, MD;  Location: [facility];  Service: Cardiovascular;  Laterality: Right;   LOWER EXTREMITY ANGIOGRAPHY Right 07/02/2020   Procedure: LOWER EXTREMITY ANGIOGRAPHY;  Surgeon: Jama Cordella MATSU, MD;  Location: [facility];  Service: Cardiovascular;  Laterality: Right;    PERIPHERAL VASCULAR CATHETERIZATION N/A 02/09/2016   Procedure: Abdominal Aortogram w/Lower Extremity;  Surgeon: Cordella MATSU Jama, MD;  Location: [facility];  Service: Cardiovascular;  Laterality: N/A;   PERIPHERAL VASCULAR CATHETERIZATION Right 02/10/2016   Procedure: Lower Extremity Angiography;  Surgeon: Cordella MATSU Jama, MD;  Location: [facility];  Service: Cardiovascular;  Laterality: Right;   POLYPECTOMY  11/05/2017   Procedure: POLYPECTOMY;  Surgeon: Jinny Carmine, MD;  Location: [facility];  Service: Endoscopy;;   POLYPECTOMY  11/13/2022   Procedure: POLYPECTOMY;  Surgeon: Jinny Carmine, MD;  Location: [facility];  Service: Endoscopy;;   Sarcoidos     bone   SHOULDER ARTHROSCOPY WITH ROTATOR CUFF REPAIR AND SUBACROMIAL DECOMPRESSION Right 02/27/2020   Procedure: RIGHT SHOULDER ARTHROSCOPY SUBACROMIAL DECOMPRESSION, DISTAL CLAVICLE EXCISION AND MINI-OPEN ROTATOR CUFF REPAIR;  Surgeon: Marchia Drivers, MD;  Location: [facility];  Service: Orthopedics;  Laterality: Right;   THORACIC LAMINECTOMY FOR SPINAL CORD STIMULATOR N/A 08/28/2023   Procedure: THORACIC LAMINECTOMY FOR SPINAL CORD STIMULATOR PLACEMENT (MEDTRONIC);  Surgeon: Claudene Penne ORN, MD;  Location: [facility];  Service: Neurosurgery;  Laterality: N/A;   TONSILLECTOMY     VASCULAR SURGERY  [id number]   Fem-Pop Bypass    Social History   Socioeconomic History   Marital status: Single    Spouse name: Not on file   Number of children: Not on file   Years of education: Not on file   Highest education level: Not on file  Occupational History   Not on file  Tobacco Use   Smoking status: Former    Current packs/day: 0.00    Average packs/day: 2.0 packs/day for 25.0 years (50.0 ttl pk-yrs)    Types: Cigarettes    Start date: 08/21/1966    Quit date: 08/22/1991    Years since quitting: 32.9   Smokeless tobacco: Never  Vaping Use   Vaping status: Never Used  Substance and Sexual Activity    Alcohol use: No    Alcohol/week: 0.0 standard drinks of alcohol   Drug use: Never   Sexual activity: Not Currently  Other Topics Concern   Not on file  Social History Narrative   Not on file   Social Drivers of Health   Tobacco Use: Medium Risk (08/11/2024)   Patient History    Smoking Tobacco Use: Former    Smokeless Tobacco Use: Never    Passive Exposure: Not on file  Financial Resource Strain: Patient Declined (07/16/2023)   Received from [facility]   Overall Financial Resource Strain (CARDIA)    Difficulty of Paying Living Expenses: Patient declined  Food Insecurity: No Food Insecurity (07/14/2024)   Epic    Worried About Radiation Protection Practitioner of Food in the Last Year: Never true    Ran Out of Food in the Last Year: Never true  Transportation Needs: No Transportation Needs (07/14/2024)   Epic    Lack of Transportation (Medical): No    Lack of Transportation (Non-Medical): No  Physical Activity: Not on file  Stress: Not on file  Social Connections: Socially Isolated (07/14/2024)   Social  Connection and Isolation Panel    Frequency of Communication with Friends and Family: Three times a week    Frequency of Social Gatherings with Friends and Family: Twice a week    Attends Religious Services: Never    Database Administrator or Organizations: No    Attends Engineer, Structural: Not on file    Marital Status: Never married  Intimate Partner Violence: Not At Risk (07/14/2024)   Epic    Fear of Current or Ex-Partner: No    Emotionally Abused: No    Physically Abused: No    Sexually Abused: No  Depression (PHQ2-9): Low Risk (05/22/2023)   Depression (PHQ2-9)    PHQ-2 Score: 0  Alcohol Screen: Not on file  Housing: Low Risk (07/14/2024)   Epic    Unable to Pay for Housing in the Last Year: No    Number of Times Moved in the Last Year: 0    Homeless in the Last Year: No  Utilities: Not At Risk (07/14/2024)   Epic    Threatened with loss of utilities:  No  Health Literacy: Not on file    Family History  Problem Relation Age of Onset   CVA Mother    Heart attack Mother    Cancer Father        colon cancer   Colon cancer Father    Heart disease Maternal Uncle    Breast cancer Neg Hx     Allergies[1]  Show/hide medication list[2]  Review of Systems  Constitutional: Negative.   HENT: Negative.    Eyes: Negative.   Respiratory: Negative.  Negative for shortness of breath.   Cardiovascular: Negative.  Negative for chest pain.  Gastrointestinal: Negative.  Negative for abdominal pain, constipation and diarrhea.  Genitourinary: Negative.   Musculoskeletal:  Negative for joint pain and myalgias.  Skin: Negative.   Neurological: Negative.  Negative for dizziness and headaches.  Endo/Heme/Allergies: Negative.   All other systems reviewed and are negative.      Objective:   BP 121/77   Pulse (!) 102   Ht 5' 3 (1.6 m)   Wt 137 lb (62.1 kg)   SpO2 93%   BMI 24.27 kg/m   Vitals:   08/11/24 1303  BP: 121/77  Pulse: (!) 102  Height: 5' 3 (1.6 m)  Weight: 137 lb (62.1 kg)  SpO2: 93%  BMI (Calculated): 24.27    Physical Exam Vitals and nursing note reviewed.  Constitutional:      Appearance: Normal appearance. She is normal weight.  HENT:     Head: Normocephalic and atraumatic.     Nose: Nose normal.     Mouth/Throat:     Mouth: Mucous membranes are moist.  Eyes:     Extraocular Movements: Extraocular movements intact.     Conjunctiva/sclera: Conjunctivae normal.     Pupils: Pupils are equal, round, and reactive to light.  Cardiovascular:     Rate and Rhythm: Normal rate and regular rhythm.     Pulses: Normal pulses.     Heart sounds: Normal heart sounds.  Pulmonary:     Effort: Pulmonary effort is normal.     Breath sounds: Normal breath sounds.  Abdominal:     General: Abdomen is flat. Bowel sounds are normal.     Palpations: Abdomen is soft.  Musculoskeletal:        General: Normal range of motion.      Cervical back: Normal range of motion.  Skin:  General: Skin is warm and dry.  Neurological:     General: No focal deficit present.     Mental Status: She is alert and oriented to person, place, and time.  Psychiatric:        Mood and Affect: Mood normal.        Behavior: Behavior normal.        Thought Content: Thought content normal.        Judgment: Judgment normal.      No results found for any visits on 08/11/24.  Recent Results (from the past 2160 hours)  ANA, IFA (with reflex)     Status: Abnormal   Collection Time: 06/16/24 10:36 AM  Result Value Ref Range   ANA Titer 1 Positive (A)     Comment:                                      Negative   <1:80                                      Borderline  1:80                                      Positive   >1:80   Rheumatoid Arthritis Profile     Status: None   Collection Time: 06/16/24 10:36 AM  Result Value Ref Range   Rheumatoid fact SerPl-aCnc <10.0 <14.0 IU/mL   Cyclic Citrullin Peptide Ab 9 0 - 19 units    Comment:                           Negative               <20                           Weak positive      20 - 39                           Moderate positive  40 - 59                           Strong positive        >59   CRP (C-Reactive Protein)     Status: None   Collection Time: 06/16/24 10:36 AM  Result Value Ref Range   CRP 7 0 - 10 mg/L  Lipid Profile     Status: Abnormal   Collection Time: 06/16/24 10:36 AM  Result Value Ref Range   Cholesterol, Total 96 (L) 100 - 199 mg/dL   Triglycerides 81 0 - 149 mg/dL   HDL 40 >60 mg/dL   VLDL Cholesterol Cal 17 5 - 40 mg/dL   LDL Chol Calc (NIH) 39 0 - 99 mg/dL   Chol/HDL Ratio 2.4 0.0 - 4.4 ratio    Comment:                                   T. Chol/HDL Ratio  Men  Women                               1/2 Avg.Risk  3.4    3.3                                   Avg.Risk  5.0    4.4                                 2X Avg.Risk  9.6    7.1                                3X Avg.Risk 23.4   11.0   Hemoglobin A1c     Status: Abnormal   Collection Time: 06/16/24 10:36 AM  Result Value Ref Range   Hgb A1c MFr Bld 6.4 (H) 4.8 - 5.6 %    Comment:          Prediabetes: 5.7 - 6.4          Diabetes: >6.4          Glycemic control for adults with diabetes: <7.0    Est. average glucose Bld gHb Est-mCnc 137 mg/dL  TSH     Status: None   Collection Time: 06/16/24 10:36 AM  Result Value Ref Range   TSH 2.090 0.450 - 4.500 uIU/mL  CMP14+EGFR     Status: Abnormal   Collection Time: 06/16/24 10:36 AM  Result Value Ref Range   Glucose 112 (H) 70 - 99 mg/dL   BUN 8 8 - 27 mg/dL   Creatinine, Ser 9.05 0.57 - 1.00 mg/dL   eGFR 65 >40 fO/fpw/8.26   BUN/Creatinine Ratio 9 (L) 12 - 28   Sodium 140 134 - 144 mmol/L   Potassium 4.8 3.5 - 5.2 mmol/L   Chloride 101 96 - 106 mmol/L   CO2 22 20 - 29 mmol/L   Calcium  10.1 8.7 - 10.3 mg/dL   Total Protein 7.4 6.0 - 8.5 g/dL   Albumin 4.0 3.8 - 4.8 g/dL   Globulin, Total 3.4 1.5 - 4.5 g/dL   Bilirubin Total 0.7 0.0 - 1.2 mg/dL   Alkaline Phosphatase 89 49 - 135 IU/L   AST 34 0 - 40 IU/L   ALT 19 0 - 32 IU/L  FANA Staining Patterns     Status: Abnormal   Collection Time: 06/16/24 10:36 AM  Result Value Ref Range   Homogeneous Pattern 1:640 (H)     Comment: ICAP nomenclature: AC-1   Note: Comment     Comment: Pattern              Potential Disease Association -------------  --------------------------------------------- Homogeneous    Systemic Lupus Erythematosus, Drug Induced                Systemic Lupus Erythematosus, Chronic                Autoimmune hepatitis, Juvenile Idiopathic                Arthritis -------------  --------------------------------------------- Speckled       Sjogren Syndrome, Systemic Lupus                Erythematosus, Subacute Cutaneous Lupus,  Neonatal Lupus, Congenital Heart Block,                Mixed Connective Tissue  Disease,                Scleroderma-diffuse, Scleroderma-Autoimmune                Myositis Overlap Syndrome, Systemic Lupus                Erythematosus-Scleroderma-Autoimmune                Myositis Overlap Syndrome, Systemic                Autoimmune Rheumatic Disease,                Undifferentiated Connective Tissue Disease -------------  --------------------------------------------- Nucleolar      Systemic Bartlett lerosis, Scleroderma-Autoimmune                Myositis Overlap Syndrome, Sjogren                Syndrome, Raynaud phenomenon, Pulmonary                Arterial Hypertension, Systemic Autoimmune                Rheumatic Disease, Cancer -------------  --------------------------------------------- Centromere     Scleroderma-CREST, Limited Cutaneous SSc,                Raynaud's Phenomenon, Primary Biliary                Cholangitis -------------  --------------------------------------------- Nuclear Dot    Primary Biliary Cholangitis -------------  --------------------------------------------- Nuclear        Primary Biliary Cholangitis, Autoimmune Membrane       Hepatitis/Liver disease, Systemic Autoimmune                Rheumatic Disease, Autoimmune Cytopenias,                Linear Scleroderma, Antiphospholipid Syndrome -------------  ---------------------------------------------   CBC     Status: Abnormal   Collection Time: 07/14/24  4:26 AM  Result Value Ref Range   WBC 16.5 (H) 4.0 - 10.5 K/uL   RBC 4.89 3.87 - 5.11 MIL/uL   Hemoglobin 15.8 (H) 12.0 - 15.0 g/dL   HCT 53.0 (H) 63.9 - 53.9 %   MCV 95.9 80.0 - 100.0 fL   MCH 32.3 26.0 - 34.0 pg   MCHC 33.7 30.0 - 36.0 g/dL   RDW 86.7 88.4 - 84.4 %   Platelets 228 150 - 400 K/uL   nRBC 0.0 0.0 - 0.2 %    Comment: Performed at [facility], [address] [place], [place] [address]  Resp panel by RT-PCR (RSV, Flu A&B, Covid) Anterior Nasal Swab     Status: None   Collection Time: 07/14/24  5:02 AM    Specimen: Anterior Nasal Swab  Result Value Ref Range   SARS Coronavirus 2 by RT PCR NEGATIVE NEGATIVE    Comment: (NOTE) SARS-CoV-2 target nucleic acids are NOT DETECTED.  The SARS-CoV-2 RNA is generally detectable in upper respiratory specimens during the acute phase of infection. The lowest concentration of SARS-CoV-2 viral copies this assay can detect is 138 copies/mL. A negative result does not preclude SARS-Cov-2 infection and should not be used as the sole basis for treatment or other patient management decisions. A negative result may occur with  improper specimen collection/handling, submission of specimen other than nasopharyngeal swab,  presence of viral mutation(s) within the areas targeted by this assay, and inadequate number of viral copies(<138 copies/mL). A negative result must be combined with clinical observations, patient history, and epidemiological information. The expected result is Negative.  Fact Sheet for Patients:  bloggercourse.com  Fact Sheet for Healthcare Providers:  seriousbroker.it  This test is no t yet approved or cleared by the [place]  FDA and  has been authorized for detection and/or diagnosis of SARS-CoV-2 by FDA under an Emergency Use Authorization (EUA). This EUA will remain  in effect (meaning this test can be used) for the duration of the COVID-19 declaration under Section 564(b)(1) of the Act, 21 U.S.C.section 360bbb-3(b)(1), unless the authorization is terminated  or revoked sooner.       Influenza A by PCR NEGATIVE NEGATIVE   Influenza B by PCR NEGATIVE NEGATIVE    Comment: (NOTE) The Xpert Xpress SARS-CoV-2/FLU/RSV plus assay is intended as an aid in the diagnosis of influenza from Nasopharyngeal swab specimens and should not be used as a sole basis for treatment. Nasal washings and aspirates are unacceptable for Xpert Xpress SARS-CoV-2/FLU/RSV testing.  Fact Sheet for  Patients: bloggercourse.com  Fact Sheet for Healthcare Providers: seriousbroker.it  This test is not yet approved or cleared by the [place]  FDA and has been authorized for detection and/or diagnosis of SARS-CoV-2 by FDA under an Emergency Use Authorization (EUA). This EUA will remain in effect (meaning this test can be used) for the duration of the COVID-19 declaration under Section 564(b)(1) of the Act, 21 U.S.C. section 360bbb-3(b)(1), unless the authorization is terminated or revoked.     Resp Syncytial Virus by PCR NEGATIVE NEGATIVE    Comment: (NOTE) Fact Sheet for Patients: bloggercourse.com  Fact Sheet for Healthcare Providers: seriousbroker.it  This test is not yet approved or cleared by the [place]  FDA and has been authorized for detection and/or diagnosis of SARS-CoV-2 by FDA under an Emergency Use Authorization (EUA). This EUA will remain in effect (meaning this test can be used) for the duration of the COVID-19 declaration under Section 564(b)(1) of the Act, 21 U.S.C. section 360bbb-3(b)(1), unless the authorization is terminated or revoked.  Performed at [facility], [address] [place], [place] [address]   D-dimer, quantitative     Status: Abnormal   Collection Time: 07/14/24  5:02 AM  Result Value Ref Range   D-Dimer, Quant 10.55 (H) 0.00 - 0.50 ug/mL-FEU    Comment: (NOTE) At the manufacturer cut-off value of 0.5 g/mL FEU, this assay has a negative predictive value of 95-100%.This assay is intended for use in conjunction with a clinical pretest probability (PTP) assessment model to exclude pulmonary embolism (PE) and deep venous thrombosis (DVT) in outpatients suspected of PE or DVT. Results should be correlated with clinical presentation. Performed at [facility], [address] [place], [place] [address]   Basic  metabolic panel with GFR     Status: Abnormal   Collection Time: 07/14/24  5:15 AM  Result Value Ref Range   Sodium 130 (L) 135 - 145 mmol/L   Potassium 4.0 3.5 - 5.1 mmol/L   Chloride 97 (L) 98 - 111 mmol/L   CO2 21 (L) 22 - 32 mmol/L   Glucose, Bld 140 (H) 70 - 99 mg/dL    Comment: Glucose reference range applies only to samples taken after fasting for at least 8 hours.   BUN 17 8 - 23 mg/dL   Creatinine, Ser 9.27 0.44 - 1.00 mg/dL   Calcium   8.9 8.9 - 10.3 mg/dL   GFR, Estimated >39 >39 mL/min    Comment: (NOTE) Calculated using the CKD-EPI Creatinine Equation (2021)    Anion gap 12 5 - 15    Comment: Performed at [facility], [address] [place], [place] [address]  Troponin T, High Sensitivity     Status: None   Collection Time: 07/14/24  5:15 AM  Result Value Ref Range   Troponin T High Sensitivity <15 0 - 19 ng/L    Comment: (NOTE) Biotin concentrations > 1000 ng/mL falsely decrease TnT results.  Serial cardiac troponin measurements are suggested.  Refer to the Links section for chest pain algorithms and additional  guidance. Performed at [facility], [address] [place], [place] [address]   Blood culture (routine x 2)     Status: None   Collection Time: 07/14/24  5:49 AM   Specimen: BLOOD  Result Value Ref Range   Specimen Description BLOOD BLOOD LEFT ARM    Special Requests      BOTTLES DRAWN AEROBIC AND ANAEROBIC Blood Culture results may not be optimal due to an inadequate volume of blood received in culture bottles   Culture      NO GROWTH 5 DAYS Performed at [facility], [address], [place], [place] [address]    Report Status 07/19/2024 FINAL   Blood culture (routine x 2)     Status: None   Collection Time: 07/14/24  5:49 AM   Specimen: BLOOD  Result Value Ref Range   Specimen Description BLOOD BLOOD LEFT ARM    Special Requests      BOTTLES DRAWN AEROBIC AND ANAEROBIC Blood Culture results may not be optimal due  to an inadequate volume of blood received in culture bottles   Culture      NO GROWTH 5 DAYS Performed at [facility], [address], [place], [place] [address]    Report Status 07/19/2024 FINAL   Lactic acid, plasma     Status: None   Collection Time: 07/14/24  5:49 AM  Result Value Ref Range   Lactic Acid, Venous 1.9 0.5 - 1.9 mmol/L    Comment: Performed at [facility], [address] [place], [place] [address]  Digoxin  level     Status: Abnormal   Collection Time: 07/14/24  6:16 AM  Result Value Ref Range   Digoxin  Level <0.6 (L) 0.8 - 2.0 ng/mL    Comment: Performed at [facility], [address] [place], [place] [address]  Hepatic function panel     Status: Abnormal   Collection Time: 07/14/24  6:16 AM  Result Value Ref Range   Total Protein 7.5 6.5 - 8.1 g/dL   Albumin 3.2 (L) 3.5 - 5.0 g/dL   AST 28 15 - 41 U/L   ALT 9 0 - 44 U/L   Alkaline Phosphatase 70 38 - 126 U/L   Total Bilirubin 1.4 (H) 0.0 - 1.2 mg/dL   Bilirubin, Direct 0.8 (H) 0.0 - 0.2 mg/dL   Indirect Bilirubin 0.6 0.3 - 0.9 mg/dL    Comment: Performed at [facility], [address] [place], [place] [address]  Pro Brain natriuretic peptide     Status: Abnormal   Collection Time: 07/14/24  6:16 AM  Result Value Ref Range   Pro Brain Natriuretic Peptide 678.0 (H) <300.0 pg/mL    Comment: (NOTE) Age Group        Cut-Points    Interpretation  <  50 years     450 pg/mL       NT-proBNP > 450 pg/mL indicates                                ADHF is likely              50 to 75 years  900 pg/mL      NT-proBNP > 900 pg/mL indicates          ADHF is likely  > 75 years      1800 pg/mL     NT-proBNP > 1800 pg/mL indicates          ADHF is likely                           All ages    Results between       Indeterminate. Further clinical             300 and the cut-   information is needed to determine            point for age group   if ADHF is present.                                                              Elecsys proBNP II/ Elecsys proBNP II STAT           Cut-Point                       Interpretation  300 pg/mL                    NT-proBNP <300pg/mL indicates                             ADHF is not likely  Performed at [facility], [address] [place], [place] [address]   Lactate dehydrogenase (pleural or peritoneal fluid)     Status: Abnormal   Collection Time: 07/14/24  2:05 PM  Result Value Ref Range   LD, Fluid 2,076 (H) 3 - 23 U/L    Comment: (NOTE) Results should be evaluated in conjunction with serum values Performed at [facility], [address] [place], [place] [address]    Fluid Type-FLDH CYTO PLEU     Comment: Performed at [facility], [address] [place], [place] [address]  Body fluid cell count with differential     Status: Abnormal   Collection Time: 07/14/24  2:05 PM  Result Value Ref Range   Fluid Type-FCT CYTO PLEU    Color, Fluid YELLOW YELLOW   Appearance, Fluid CLOUDY (A) CLEAR   Total Nucleated Cell Count, Fluid 9,866 cu mm   Neutrophil Count, Fluid 94 %   Lymphs, Fluid 1 %   Monocyte-Macrophage-Serous Fluid 5 %   Eos, Fluid 0 %    Comment: Performed at [facility], [address] [place], [place] [address]  Albumin, pleural or peritoneal fluid      Status: None   Collection Time: 07/14/24  2:05 PM  Result Value Ref Range   Albumin, Fluid  2.0 g/dL    Comment: (NOTE) No normal range established for this test Results should be evaluated in conjunction with serum values Performed at [facility], [address], [place], [place] [address]    Fluid Type-FALB CYTO PLEU     Comment: Performed at [facility], [address] [place], [place] [address]  Protein, pleural or peritoneal fluid     Status: None   Collection Time: 07/14/24  2:05 PM  Result Value Ref Range   Total protein, fluid 5.1 g/dL    Comment: (NOTE) No normal range  established for this test Results should be evaluated in conjunction with serum values Performed at [facility], [address] [place], [place] [address]    Fluid Type-FTP CYTO PLEU     Comment: Performed at [facility], [address] [place], [place] [address]  Glucose, pleural or peritoneal fluid     Status: None   Collection Time: 07/14/24  2:05 PM  Result Value Ref Range   Glucose, Fluid 108 mg/dL    Comment: (NOTE) No normal range established for this test Results should be evaluated in conjunction with serum values Performed at [facility], [address], [place], [place] [address]    Fluid Type-FGLU CYTO PLEU     Comment: Performed at [facility], [address] [place], [place] [address]  Flow cytometry panel-leukemia/lymphoma work-up     Status: None   Collection Time: 07/14/24  2:05 PM  Result Value Ref Range   PATH INTERP XXX-IMP Comment     Comment: No significant immunophenotypic abnormality detected   ANNOTATION COMMENT IMP Comment     Comment: Cytopathologic correlation is required for diagnosis.   Specimen Type Comment     Comment: (NOTE) Pleural fluid/chest cavity fluid *includes thoracentesis fluid and pleural effusion    ASSESSMENT OF LEUKOCYTES Comment     Comment: (NOTE) The B cells express a normal appearing phenotype however surface immunoglobulin expression is not detected. This can be seen in reactive and neoplastic processes and also as an artifact. There is no loss of, or aberrant expression of, the pan T cell antigens to suggest a neoplastic T cell process. CD4:CD8 ratio 1.2 Analysis of the viable lymphoid cells shows: B cells 23%, T cells 77%    % Viable Cells Comment     Comment: (NOTE) 79% The specimen was 41 days old when received in the Flow Cytometry Laboratory.     ANALYSIS AND GATING STRATEGY Comment     Comment: (NOTE) 8-color analysis with 7-AAD, CD45/SSC gating. [facility]     IMMUNOPHENOTYPING STUDY Comment     Comment: (NOTE) CD2       Normal         CD3       Normal CD4       Normal         CD5       Normal CD7       Normal         CD8       Normal CD10      Normal         CD11b     Normal CD19      Normal         CD20      Normal CD23      Normal         CD38      Normal CD45      (+)  Dim        CD56      Normal CD57      Normal         FMC-7     Normal HLA-DR    Normal         KAPPA     See Text LAMBDA    See Text    PATHOLOGIST NAME Comment     Comment: Graciela Rous, M.D.   COMMENT: Comment     Comment: (NOTE) Each antibody in this assay was utilized to assess for potential abnormalities of studied cell populations or to characterize identified abnormalities. This test was developed and its performance characteristics determined by [facility].  It has not been cleared or approved by the U.S. Food and Drug Administration. The FDA has determined that such clearance or approval is not necessary. This test is used for clinical purposes.  It should not be regarded as investigational or for research. Performed At: -[facility] [address] [place], [place] [id number] Loran Gales MDPhD Ey:[phone] Performed At: [facility] [address] [place], [place] [id number] Loran Gales MD Ey:[phone] Performed At: [facility] [address] [place], [place] [id number] Chenn Anjen MDPhD Ey:[phone]   Miscellaneous [facility] test (send-out)     Status: None   Collection Time: 07/14/24  2:05 PM  Result Value Ref Range   [facility] test code 990014     Comment: Performed at [facility], [address], [place], [place] [address] CORRECTED ON 11/25 AT 1117: PREVIOUSLY REPORTED AS 5367    [facility] test name 5367 PH on pleural fluid  TO [facility]     Comment: Performed at [facility], [address] [place], [place] [address] CORRECTED ON 11/25 AT 1117: PREVIOUSLY REPORTED AS PH TO [facility]    Misc [facility] result  COMMENT     Comment: See Scanned report in [facility] (NOTE) Performed At: [facility] [address] [place], [place] [id number] Jennette [place] MD Ey:[phone] Performed at [facility], [address] [place], [place] [address]   Body fluid culture w Gram Stain     Status: None   Collection Time: 07/14/24  2:05 PM   Specimen: PATH Cytology Pleural fluid  Result Value Ref Range   Specimen Description      PLEURAL Performed at [facility], [address] [place], [place] [address]    Special Requests      NONE Performed at [facility], [address] [place], [place] [address]    Gram Stain      FEW WBC PRESENT,BOTH PMN AND MONONUCLEAR NO ORGANISMS SEEN    Culture      NO GROWTH 4 DAYS Performed at [facility], [address] [place], [place] [address]    Report Status 07/18/2024 FINAL   Pathologist smear review     Status: None   Collection Time: 07/14/24  2:05 PM  Result Value Ref Range   Path Review Cytospin of pleural fluid is reviewed.     Comment: Negative for malignancy. Marked acute inflammation. Reviewed by Wally Highland, MD Performed at [facility], [address] [place], [place] [address]   Miscellaneous [facility] test (send-out)     Status: None   Collection Time: 07/14/24  2:05 PM  Result Value Ref Range   [facility] test code 911937    [facility] test name AMYLASE, BODY FLUID    Source ([facility])  , PLEURAL FL,LEFT     Comment: Performed at [facility], [address], [place], [place] [address]   Misc [facility] result COMMENT     Comment: (NOTE) Test Ordered: [phone] Amylase, Body Fluid Amylase, Body Fluid            23               U/L      BN               _________________________________________            : BODY FLUID TYPE :        AMYLASE        :            :_________________:_______________________:            : Lymph           :        50 - 83        :             :_________________:_______________________:            : Peritoneal      :                       :            : Fluid           :        88 - 109       :            :_________________:_______________________:            : Saliva          :                       :            : (Mixed Glands)  :     [address] - [id number]    :            :_________________:_______________________:             [place] ORN, [place] GAILS. Reference Intervals             for Adults and Children 2008. Ninth             Edition (V9.1) [employer],             [place]; [place]: July 2009. Performed At: [facility] [address] [place], [place] [id number] Jennette [place] MD Ey:[phone]   Troponin T, High Sensitivity     Status: None   Collection Time: 07/14/24  4:44 PM  Result Value Ref Range   Troponin T High Sensitivity <15 0 - 19 ng/L    Comment: (NOTE) Biotin concentrations > 1000 ng/mL falsely decrease TnT results.  Serial cardiac troponin measurements are suggested.  Refer to the Links section for chest pain algorithms and additional  guidance. Performed at [facility], [address] [place], [place] [address]   CBC     Status: Abnormal   Collection Time: 07/15/24  3:41 AM  Result Value Ref Range   WBC 13.9 (H) 4.0 - 10.5 K/uL   RBC 4.93 3.87 - 5.11 MIL/uL   Hemoglobin 16.0 (H) 12.0 - 15.0 g/dL   HCT 50.0 (H) 63.9 - 53.9 %   MCV 101.2 (H) 80.0 -  100.0 fL   MCH 32.5 26.0 - 34.0 pg   MCHC 32.1 30.0 - 36.0 g/dL   RDW 86.6 88.4 - 84.4 %   Platelets 240 150 - 400 K/uL   nRBC 0.0 0.0 - 0.2 %    Comment: Performed at [facility], [address] [place], [place] [address]  Basic metabolic panel with GFR     Status: Abnormal   Collection Time: 07/15/24  3:41 AM  Result Value Ref Range   Sodium 128 (L) 135 - 145 mmol/L   Potassium 3.6 3.5 - 5.1 mmol/L   Chloride 93 (L) 98 - 111 mmol/L   CO2 19 (L) 22 - 32 mmol/L   Glucose, Bld 127 (H) 70 - 99 mg/dL    Comment:  Glucose reference range applies only to samples taken after fasting for at least 8 hours.   BUN 24 (H) 8 - 23 mg/dL   Creatinine, Ser 9.09 0.44 - 1.00 mg/dL   Calcium  8.5 (L) 8.9 - 10.3 mg/dL   GFR, Estimated >39 >39 mL/min    Comment: (NOTE) Calculated using the CKD-EPI Creatinine Equation (2021)    Anion gap 16 (H) 5 - 15    Comment: Performed at [facility], [address] [place], [place] [address]  ECHOCARDIOGRAM COMPLETE     Status: None   Collection Time: 07/15/24  8:47 AM  Result Value Ref Range   Weight 2,102.4 oz   BP 124/75 mmHg   S' Lateral 2.50 cm   Est EF 60 - 65%   CBC     Status: Abnormal   Collection Time: 07/16/24  3:41 AM  Result Value Ref Range   WBC 11.7 (H) 4.0 - 10.5 K/uL   RBC 4.82 3.87 - 5.11 MIL/uL   Hemoglobin 15.3 (H) 12.0 - 15.0 g/dL   HCT 54.8 63.9 - 53.9 %   MCV 93.6 80.0 - 100.0 fL    Comment: REPEATED TO VERIFY   MCH 31.7 26.0 - 34.0 pg   MCHC 33.9 30.0 - 36.0 g/dL   RDW 86.7 88.4 - 84.4 %   Platelets 229 150 - 400 K/uL   nRBC 0.0 0.0 - 0.2 %    Comment: Performed at [facility], [address] [place], [place] [address]  Basic metabolic panel with GFR     Status: Abnormal   Collection Time: 07/16/24  3:41 AM  Result Value Ref Range   Sodium 130 (L) 135 - 145 mmol/L   Potassium 3.6 3.5 - 5.1 mmol/L   Chloride 94 (L) 98 - 111 mmol/L   CO2 23 22 - 32 mmol/L   Glucose, Bld 120 (H) 70 - 99 mg/dL    Comment: Glucose reference range applies only to samples taken after fasting for at least 8 hours.   BUN 33 (H) 8 - 23 mg/dL   Creatinine, Ser 9.12 0.44 - 1.00 mg/dL   Calcium  8.5 (L) 8.9 - 10.3 mg/dL   GFR, Estimated >39 >39 mL/min    Comment: (NOTE) Calculated using the CKD-EPI Creatinine Equation (2021)    Anion gap 14 5 - 15    Comment: Performed at [facility], [address] [place], [place] [address]  CBC with Differential/Platelet     Status: Abnormal   Collection Time: 07/17/24  8:25 AM  Result  Value Ref Range   WBC 11.0 (H) 4.0 - 10.5 K/uL   RBC 4.76 3.87 - 5.11 MIL/uL   Hemoglobin 15.1 (H) 12.0 - 15.0  g/dL   HCT 54.5 63.9 - 53.9 %   MCV 95.4 80.0 - 100.0 fL   MCH 31.7 26.0 - 34.0 pg   MCHC 33.3 30.0 - 36.0 g/dL   RDW 86.5 88.4 - 84.4 %   Platelets 257 150 - 400 K/uL   nRBC 0.0 0.0 - 0.2 %   Neutrophils Relative % 79 %   Neutro Abs 8.8 (H) 1.7 - 7.7 K/uL   Lymphocytes Relative 8 %   Lymphs Abs 0.9 0.7 - 4.0 K/uL   Monocytes Relative 8 %   Monocytes Absolute 0.9 0.1 - 1.0 K/uL   Eosinophils Relative 2 %   Eosinophils Absolute 0.2 0.0 - 0.5 K/uL   Basophils Relative 1 %   Basophils Absolute 0.1 0.0 - 0.1 K/uL   Immature Granulocytes 2 %   Abs Immature Granulocytes 0.18 (H) 0.00 - 0.07 K/uL    Comment: Performed at [facility], [address] [place], [place] [address]  Comprehensive metabolic panel with GFR     Status: Abnormal   Collection Time: 07/17/24  8:25 AM  Result Value Ref Range   Sodium 133 (L) 135 - 145 mmol/L   Potassium 3.9 3.5 - 5.1 mmol/L   Chloride 95 (L) 98 - 111 mmol/L   CO2 28 22 - 32 mmol/L   Glucose, Bld 127 (H) 70 - 99 mg/dL    Comment: Glucose reference range applies only to samples taken after fasting for at least 8 hours.   BUN 29 (H) 8 - 23 mg/dL   Creatinine, Ser 9.25 0.44 - 1.00 mg/dL   Calcium  8.9 8.9 - 10.3 mg/dL   Total Protein 7.2 6.5 - 8.1 g/dL   Albumin 2.8 (L) 3.5 - 5.0 g/dL   AST 51 (H) 15 - 41 U/L   ALT 27 0 - 44 U/L   Alkaline Phosphatase 78 38 - 126 U/L   Total Bilirubin 0.5 0.0 - 1.2 mg/dL   GFR, Estimated >39 >39 mL/min    Comment: (NOTE) Calculated using the CKD-EPI Creatinine Equation (2021)    Anion gap 11 5 - 15    Comment: Performed at [facility], [address] [place], [place] [address]  Magnesium      Status: Abnormal   Collection Time: 07/18/24  4:23 AM  Result Value Ref Range   Magnesium  2.6 (H) 1.7 - 2.4 mg/dL    Comment: Performed at [facility], [address]  [place], [place] [address]  CBC with Differential/Platelet     Status: Abnormal   Collection Time: 07/18/24  4:23 AM  Result Value Ref Range   WBC 12.8 (H) 4.0 - 10.5 K/uL   RBC 4.40 3.87 - 5.11 MIL/uL   Hemoglobin 14.2 12.0 - 15.0 g/dL   HCT 58.1 63.9 - 53.9 %   MCV 95.0 80.0 - 100.0 fL   MCH 32.3 26.0 - 34.0 pg   MCHC 34.0 30.0 - 36.0 g/dL   RDW 86.8 88.4 - 84.4 %   Platelets 264 150 - 400 K/uL   nRBC 0.0 0.0 - 0.2 %   Neutrophils Relative % 89 %   Neutro Abs 11.3 (H) 1.7 - 7.7 K/uL   Lymphocytes Relative 5 %   Lymphs Abs 0.6 (L) 0.7 - 4.0 K/uL   Monocytes Relative 4 %   Monocytes Absolute 0.5 0.1 - 1.0 K/uL   Eosinophils Relative 0 %   Eosinophils Absolute 0.0 0.0 - 0.5 K/uL   Basophils Relative 0 %  Basophils Absolute 0.0 0.0 - 0.1 K/uL   Immature Granulocytes 2 %   Abs Immature Granulocytes 0.26 (H) 0.00 - 0.07 K/uL    Comment: Performed at [facility], [address] [place], [place] [address]  Comprehensive metabolic panel with GFR     Status: Abnormal   Collection Time: 07/18/24  4:23 AM  Result Value Ref Range   Sodium 132 (L) 135 - 145 mmol/L   Potassium 4.3 3.5 - 5.1 mmol/L   Chloride 93 (L) 98 - 111 mmol/L   CO2 29 22 - 32 mmol/L   Glucose, Bld 166 (H) 70 - 99 mg/dL    Comment: Glucose reference range applies only to samples taken after fasting for at least 8 hours.   BUN 29 (H) 8 - 23 mg/dL   Creatinine, Ser 9.31 0.44 - 1.00 mg/dL   Calcium  8.9 8.9 - 10.3 mg/dL   Total Protein 6.9 6.5 - 8.1 g/dL   Albumin 2.9 (L) 3.5 - 5.0 g/dL   AST 53 (H) 15 - 41 U/L   ALT 40 0 - 44 U/L   Alkaline Phosphatase 75 38 - 126 U/L   Total Bilirubin 0.3 0.0 - 1.2 mg/dL   GFR, Estimated >39 >39 mL/min    Comment: (NOTE) Calculated using the CKD-EPI Creatinine Equation (2021)    Anion gap 10 5 - 15    Comment: Performed at [facility], [address] [place], [place] [address]  Phosphorus     Status: None   Collection Time: 07/18/24  4:23 AM  Result Value  Ref Range   Phosphorus 4.1 2.5 - 4.6 mg/dL    Comment: Performed at [facility], [address] [place], [place] [address]  Magnesium      Status: None   Collection Time: 07/19/24  4:46 AM  Result Value Ref Range   Magnesium  2.4 1.7 - 2.4 mg/dL    Comment: Performed at [facility], [address] [place], [place] [address]  CBC with Differential/Platelet     Status: Abnormal   Collection Time: 07/19/24  4:46 AM  Result Value Ref Range   WBC 13.0 (H) 4.0 - 10.5 K/uL   RBC 4.47 3.87 - 5.11 MIL/uL   Hemoglobin 14.4 12.0 - 15.0 g/dL   HCT 56.7 63.9 - 53.9 %   MCV 96.6 80.0 - 100.0 fL   MCH 32.2 26.0 - 34.0 pg   MCHC 33.3 30.0 - 36.0 g/dL   RDW 86.7 88.4 - 84.4 %   Platelets 311 150 - 400 K/uL   nRBC 0.0 0.0 - 0.2 %   Neutrophils Relative % 87 %   Neutro Abs 11.4 (H) 1.7 - 7.7 K/uL   Lymphocytes Relative 5 %   Lymphs Abs 0.6 (L) 0.7 - 4.0 K/uL   Monocytes Relative 3 %   Monocytes Absolute 0.4 0.1 - 1.0 K/uL   Eosinophils Relative 1 %   Eosinophils Absolute 0.1 0.0 - 0.5 K/uL   Basophils Relative 0 %   Basophils Absolute 0.0 0.0 - 0.1 K/uL   Immature Granulocytes 4 %   Abs Immature Granulocytes 0.49 (H) 0.00 - 0.07 K/uL    Comment: Performed at [facility], [address] [place], [place] [address]  Comprehensive metabolic panel with GFR     Status: Abnormal   Collection Time: 07/19/24  4:46 AM  Result Value Ref Range   Sodium 134 (L) 135 - 145 mmol/L   Potassium 4.6 3.5 - 5.1 mmol/L  Chloride 94 (L) 98 - 111 mmol/L   CO2 29 22 - 32 mmol/L   Glucose, Bld 169 (H) 70 - 99 mg/dL    Comment: Glucose reference range applies only to samples taken after fasting for at least 8 hours.   BUN 30 (H) 8 - 23 mg/dL   Creatinine, Ser 9.33 0.44 - 1.00 mg/dL   Calcium  8.9 8.9 - 10.3 mg/dL   Total Protein 6.7 6.5 - 8.1 g/dL   Albumin 2.9 (L) 3.5 - 5.0 g/dL   AST 55 (H) 15 - 41 U/L   ALT 52 (H) 0 - 44 U/L   Alkaline Phosphatase 74 38 - 126 U/L   Total  Bilirubin 0.2 0.0 - 1.2 mg/dL   GFR, Estimated >39 >39 mL/min    Comment: (NOTE) Calculated using the CKD-EPI Creatinine Equation (2021)    Anion gap 10 5 - 15    Comment: Performed at [facility], [address] [place], [place] [address]  Phosphorus     Status: None   Collection Time: 07/19/24  4:46 AM  Result Value Ref Range   Phosphorus 3.7 2.5 - 4.6 mg/dL    Comment: Performed at [facility], [address] [place], [place] [address]  Magnesium      Status: None   Collection Time: 07/20/24  5:49 AM  Result Value Ref Range   Magnesium  2.3 1.7 - 2.4 mg/dL    Comment: Performed at [facility], [address] [place], [place] [address]  CBC with Differential/Platelet     Status: Abnormal   Collection Time: 07/20/24  5:49 AM  Result Value Ref Range   WBC 16.4 (H) 4.0 - 10.5 K/uL   RBC 4.14 3.87 - 5.11 MIL/uL   Hemoglobin 13.5 12.0 - 15.0 g/dL   HCT 59.9 63.9 - 53.9 %   MCV 96.6 80.0 - 100.0 fL   MCH 32.6 26.0 - 34.0 pg   MCHC 33.8 30.0 - 36.0 g/dL   RDW 86.7 88.4 - 84.4 %   Platelets 327 150 - 400 K/uL   nRBC 0.0 0.0 - 0.2 %   Neutrophils Relative % 78 %   Neutro Abs 12.8 (H) 1.7 - 7.7 K/uL   Lymphocytes Relative 8 %   Lymphs Abs 1.3 0.7 - 4.0 K/uL   Monocytes Relative 7 %   Monocytes Absolute 1.1 (H) 0.1 - 1.0 K/uL   Eosinophils Relative 0 %   Eosinophils Absolute 0.0 0.0 - 0.5 K/uL   Basophils Relative 0 %   Basophils Absolute 0.1 0.0 - 0.1 K/uL   RBC Morphology MORPHOLOGY UNREMARKABLE    Smear Review Normal platelet morphology    Immature Granulocytes 7 %   Abs Immature Granulocytes 1.11 (H) 0.00 - 0.07 K/uL    Comment: Performed at [facility], [address] [place], [place] [address]  Comprehensive metabolic panel with GFR     Status: Abnormal   Collection Time: 07/20/24  5:49 AM  Result Value Ref Range   Sodium 136 135 - 145 mmol/L   Potassium 4.2 3.5 - 5.1 mmol/L   Chloride 96 (L) 98 - 111 mmol/L   CO2 30 22 -  32 mmol/L   Glucose, Bld 232 (H) 70 - 99 mg/dL    Comment: Glucose reference range applies only to samples taken after fasting for at least 8 hours.   BUN 28 (H) 8 - 23 mg/dL   Creatinine, Ser 9.37 0.44 - 1.00 mg/dL   Calcium  8.9 8.9 -  10.3 mg/dL   Total Protein 6.6 6.5 - 8.1 g/dL   Albumin 2.8 (L) 3.5 - 5.0 g/dL   AST 40 15 - 41 U/L   ALT 46 (H) 0 - 44 U/L   Alkaline Phosphatase 73 38 - 126 U/L   Total Bilirubin 0.2 0.0 - 1.2 mg/dL   GFR, Estimated >39 >39 mL/min    Comment: (NOTE) Calculated using the CKD-EPI Creatinine Equation (2021)    Anion gap 9 5 - 15    Comment: Performed at [facility], [address] [place], [place] [address]  Phosphorus     Status: None   Collection Time: 07/20/24  5:49 AM  Result Value Ref Range   Phosphorus 3.1 2.5 - 4.6 mg/dL    Comment: Performed at [facility], [address] [place], [place] [address]  Magnesium      Status: Abnormal   Collection Time: 07/21/24  5:44 AM  Result Value Ref Range   Magnesium  2.5 (H) 1.7 - 2.4 mg/dL    Comment: Performed at [facility], [address] [place], [place] [address]  CBC with Differential/Platelet     Status: Abnormal   Collection Time: 07/21/24  5:44 AM  Result Value Ref Range   WBC 14.5 (H) 4.0 - 10.5 K/uL   RBC 4.26 3.87 - 5.11 MIL/uL   Hemoglobin 13.7 12.0 - 15.0 g/dL   HCT 57.9 63.9 - 53.9 %   MCV 98.6 80.0 - 100.0 fL   MCH 32.2 26.0 - 34.0 pg   MCHC 32.6 30.0 - 36.0 g/dL   RDW 86.4 88.4 - 84.4 %   Platelets 323 150 - 400 K/uL   nRBC 0.0 0.0 - 0.2 %   Neutrophils Relative % 68 %   Neutro Abs 9.9 (H) 1.7 - 7.7 K/uL   Lymphocytes Relative 14 %   Lymphs Abs 2.0 0.7 - 4.0 K/uL   Monocytes Relative 6 %   Monocytes Absolute 0.9 0.1 - 1.0 K/uL   Eosinophils Relative 1 %   Eosinophils Absolute 0.2 0.0 - 0.5 K/uL   Basophils Relative 1 %   Basophils Absolute 0.1 0.0 - 0.1 K/uL   WBC Morphology See Note     Comment: Moderate Left Shift (>5% metas and myelos)    RBC Morphology MORPHOLOGY UNREMARKABLE    Smear Review See Note     Comment: Large Platelets Present   Immature Granulocytes 10 %   Abs Immature Granulocytes 1.37 (H) 0.00 - 0.07 K/uL    Comment: Performed at [facility], [address] [place], [place] [address]  Comprehensive metabolic panel with GFR     Status: Abnormal   Collection Time: 07/21/24  5:44 AM  Result Value Ref Range   Sodium 138 135 - 145 mmol/L   Potassium 4.1 3.5 - 5.1 mmol/L   Chloride 99 98 - 111 mmol/L   CO2 31 22 - 32 mmol/L   Glucose, Bld 109 (H) 70 - 99 mg/dL    Comment: Glucose reference range applies only to samples taken after fasting for at least 8 hours.   BUN 41 (H) 8 - 23 mg/dL   Creatinine, Ser 9.27 0.44 - 1.00 mg/dL   Calcium  8.7 (L) 8.9 - 10.3 mg/dL   Total Protein 6.4 (L) 6.5 - 8.1 g/dL   Albumin 2.8 (L) 3.5 - 5.0 g/dL   AST 70 (H) 15 - 41 U/L   ALT 96 (H) 0 - 44 U/L   Alkaline Phosphatase 71 38 -  126 U/L   Total Bilirubin <0.2 0.0 - 1.2 mg/dL   GFR, Estimated >39 >39 mL/min    Comment: (NOTE) Calculated using the CKD-EPI Creatinine Equation (2021)    Anion gap 8 5 - 15    Comment: Performed at [facility], [address] [place], [place] [address]  Phosphorus     Status: None   Collection Time: 07/21/24  5:44 AM  Result Value Ref Range   Phosphorus 3.4 2.5 - 4.6 mg/dL    Comment: Performed at [facility], [address] [place], [place] [address]  Magnesium      Status: None   Collection Time: 07/22/24  5:08 AM  Result Value Ref Range   Magnesium  2.4 1.7 - 2.4 mg/dL    Comment: Performed at [facility], [address] [place], [place] [address]  Comprehensive metabolic panel with GFR     Status: Abnormal   Collection Time: 07/22/24  5:08 AM  Result Value Ref Range   Sodium 136 135 - 145 mmol/L   Potassium 4.7 3.5 - 5.1 mmol/L   Chloride 99 98 - 111 mmol/L   CO2 29 22 - 32 mmol/L   Glucose, Bld 123 (H) 70 - 99 mg/dL    Comment:  Glucose reference range applies only to samples taken after fasting for at least 8 hours.   BUN 37 (H) 8 - 23 mg/dL   Creatinine, Ser 9.49 0.44 - 1.00 mg/dL   Calcium  8.5 (L) 8.9 - 10.3 mg/dL   Total Protein 5.9 (L) 6.5 - 8.1 g/dL   Albumin 2.9 (L) 3.5 - 5.0 g/dL   AST 52 (H) 15 - 41 U/L   ALT 100 (H) 0 - 44 U/L   Alkaline Phosphatase 70 38 - 126 U/L   Total Bilirubin <0.2 0.0 - 1.2 mg/dL   GFR, Estimated >39 >39 mL/min    Comment: (NOTE) Calculated using the CKD-EPI Creatinine Equation (2021)    Anion gap 8 5 - 15    Comment: Performed at [facility], [address] [place], [place] [address]  Phosphorus     Status: None   Collection Time: 07/22/24  5:08 AM  Result Value Ref Range   Phosphorus 3.7 2.5 - 4.6 mg/dL    Comment: Performed at [facility], [address] [place], [place] [address]  CBC with Differential/Platelet     Status: Abnormal   Collection Time: 07/22/24  7:44 AM  Result Value Ref Range   WBC 19.4 (H) 4.0 - 10.5 K/uL   RBC 4.26 3.87 - 5.11 MIL/uL   Hemoglobin 13.7 12.0 - 15.0 g/dL   HCT 58.5 63.9 - 53.9 %   MCV 97.2 80.0 - 100.0 fL   MCH 32.2 26.0 - 34.0 pg   MCHC 33.1 30.0 - 36.0 g/dL   RDW 86.7 88.4 - 84.4 %   Platelets 323 150 - 400 K/uL   nRBC 0.0 0.0 - 0.2 %   Neutrophils Relative % 77 %   Neutro Abs 15.0 (H) 1.7 - 7.7 K/uL   Lymphocytes Relative 8 %   Lymphs Abs 1.6 0.7 - 4.0 K/uL   Monocytes Relative 5 %   Monocytes Absolute 1.0 0.1 - 1.0 K/uL   Eosinophils Relative 0 %   Eosinophils Absolute 0.1 0.0 - 0.5 K/uL   Basophils Relative 1 %   Basophils Absolute 0.1 0.0 - 0.1 K/uL   WBC Morphology See Note     Comment: Moderate Left Shift (>5% metas  and myelos)   RBC Morphology MORPHOLOGY UNREMARKABLE    Smear Review See Note     Comment: Large Platelets Present   Immature Granulocytes 9 %   Abs Immature Granulocytes 1.71 (H) 0.00 - 0.07 K/uL    Comment: Performed at [facility], [address] [place],  [place] [address]  CBC with Differential/Platelet     Status: Abnormal   Collection Time: 07/24/24  7:32 AM  Result Value Ref Range   WBC 21.6 (H) 4.0 - 10.5 K/uL   RBC 4.56 3.87 - 5.11 MIL/uL   Hemoglobin 14.4 12.0 - 15.0 g/dL   HCT 55.5 63.9 - 53.9 %   MCV 97.4 80.0 - 100.0 fL   MCH 31.6 26.0 - 34.0 pg   MCHC 32.4 30.0 - 36.0 g/dL   RDW 86.0 88.4 - 84.4 %   Platelets 273 150 - 400 K/uL   nRBC 0.0 0.0 - 0.2 %   Neutrophils Relative % 85 %   Neutro Abs 18.3 (H) 1.7 - 7.7 K/uL   Lymphocytes Relative 5 %   Lymphs Abs 1.1 0.7 - 4.0 K/uL   Monocytes Relative 5 %   Monocytes Absolute 1.1 (H) 0.1 - 1.0 K/uL   Eosinophils Relative 1 %   Eosinophils Absolute 0.1 0.0 - 0.5 K/uL   Basophils Relative 0 %   Basophils Absolute 0.1 0.0 - 0.1 K/uL   Immature Granulocytes 4 %   Abs Immature Granulocytes 0.87 (H) 0.00 - 0.07 K/uL    Comment: Performed at [facility], [address] [place], [place] [address]  Basic metabolic panel with GFR     Status: Abnormal   Collection Time: 07/24/24  7:58 AM  Result Value Ref Range   Sodium 131 (L) 135 - 145 mmol/L   Potassium 4.6 3.5 - 5.1 mmol/L   Chloride 98 98 - 111 mmol/L   CO2 25 22 - 32 mmol/L   Glucose, Bld 142 (H) 70 - 99 mg/dL    Comment: Glucose reference range applies only to samples taken after fasting for at least 8 hours.   BUN 17 8 - 23 mg/dL   Creatinine, Ser 9.50 0.44 - 1.00 mg/dL   Calcium  8.2 (L) 8.9 - 10.3 mg/dL   GFR, Estimated >39 >39 mL/min    Comment: (NOTE) Calculated using the CKD-EPI Creatinine Equation (2021)    Anion gap 8 5 - 15    Comment: Performed at [facility], [address] [place], [place] [address]  CBC with Differential/Platelet     Status: Abnormal   Collection Time: 07/25/24  6:20 AM  Result Value Ref Range   WBC 17.2 (H) 4.0 - 10.5 K/uL   RBC 4.15 3.87 - 5.11 MIL/uL   Hemoglobin 13.3 12.0 - 15.0 g/dL   HCT 59.1 63.9 - 53.9 %   MCV 98.3 80.0 - 100.0 fL   MCH 32.0 26.0 - 34.0 pg   MCHC  32.6 30.0 - 36.0 g/dL   RDW 85.8 88.4 - 84.4 %   Platelets 240 150 - 400 K/uL   nRBC 0.0 0.0 - 0.2 %   Neutrophils Relative % 86 %   Neutro Abs 14.8 (H) 1.7 - 7.7 K/uL   Lymphocytes Relative 6 %   Lymphs Abs 1.0 0.7 - 4.0 K/uL   Monocytes Relative 5 %   Monocytes Absolute 0.8 0.1 - 1.0 K/uL   Eosinophils Relative 1 %   Eosinophils Absolute 0.2 0.0 - 0.5 K/uL   Basophils Relative  0 %   Basophils Absolute 0.1 0.0 - 0.1 K/uL   Immature Granulocytes 2 %   Abs Immature Granulocytes 0.34 (H) 0.00 - 0.07 K/uL    Comment: Performed at [facility], [address] [place], [place] [address]  Basic metabolic panel     Status: Abnormal   Collection Time: 07/25/24  6:20 AM  Result Value Ref Range   Sodium 136 135 - 145 mmol/L   Potassium 4.7 3.5 - 5.1 mmol/L   Chloride 99 98 - 111 mmol/L   CO2 28 22 - 32 mmol/L   Glucose, Bld 113 (H) 70 - 99 mg/dL    Comment: Glucose reference range applies only to samples taken after fasting for at least 8 hours.   BUN 21 8 - 23 mg/dL   Creatinine, Ser 9.45 0.44 - 1.00 mg/dL   Calcium  8.3 (L) 8.9 - 10.3 mg/dL   GFR, Estimated >39 >39 mL/min    Comment: (NOTE) Calculated using the CKD-EPI Creatinine Equation (2021)    Anion gap 9 5 - 15    Comment: Performed at [facility], [address] [place], [place] [address]      Assessment & Plan:  Referral sent to pulmonary. Continue current medications.  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   Total time spent: 25 minutes. This  time includes review of previous notes and results and patient face to face interaction during today's visit.    Jeoffrey Pollen, NP  08/11/2024   This document may have been prepared by [facility] Voice Recognition software and as such may include unintentional dictation errors.     [1]  Allergies Allergen Reactions   Zolpidem  Anaphylaxis and Other (See Comments)  [2]  Outpatient Medications Prior to Visit  Medication Sig   apixaban  (ELIQUIS ) 2.5 MG TABS tablet Take 2.5 mg by mouth 2 (two) times daily.   atorvastatin  (LIPITOR) 40 MG tablet Take 40 mg by mouth at bedtime.    Blood Pressure Monitoring (BLOOD PRESSURE CUFF) MISC 1 Units by Does not apply route daily as needed.   Calcium  Carb-Cholecalciferol  600-10 MG-MCG TABS Take 1 tablet by mouth 2 (two) times daily.   digoxin  (LANOXIN ) 0.125 MG tablet Take 0.125 mg by mouth daily.   DULoxetine  (CYMBALTA ) 60 MG capsule TAKE 1 CAPSULE BY MOUTH TWICE A DAY   fluticasone  furoate-vilanterol (BREO ELLIPTA ) 100-25 MCG/ACT AEPB Inhale 1 puff into the lungs daily.   ivabradine  (CORLANOR ) 7.5 MG TABS tablet Take 1 tablet (7.5 mg total) by mouth 2 (two) times daily with a meal.   melatonin 5 MG TABS Take 1 tablet (5 mg total) by mouth at bedtime as needed.   meloxicam  (MOBIC ) 15 MG tablet Take 1 tablet (15 mg total) by mouth daily.   polyethylene glycol (MIRALAX  / GLYCOLAX ) 17 g packet Take 17 g by mouth daily.   triamcinolone  ointment (KENALOG ) 0.5 % Apply 1 Application topically 2 (two) times daily.   No facility-administered medications prior to visit.   "

## 2024-09-12 ENCOUNTER — Ambulatory Visit

## 2024-09-12 ENCOUNTER — Ambulatory Visit: Admitting: Cardiology

## 2024-09-15 ENCOUNTER — Ambulatory Visit: Admitting: Cardiology

## 2024-09-17 ENCOUNTER — Ambulatory Visit: Admitting: Cardiology

## 2024-09-18 ENCOUNTER — Ambulatory Visit: Admitting: Cardiology

## 2024-09-18 ENCOUNTER — Telehealth: Payer: Self-pay | Admitting: Cardiology

## 2024-09-18 ENCOUNTER — Other Ambulatory Visit: Payer: Self-pay | Admitting: Cardiology

## 2024-09-18 ENCOUNTER — Telehealth: Payer: Self-pay

## 2024-09-18 MED ORDER — NITROFURANTOIN MONOHYD MACRO 100 MG PO CAPS: 100.0000 mg | ORAL_CAPSULE | Freq: Two times a day (BID) | ORAL | 0 refills | Status: AC

## 2024-09-18 NOTE — Telephone Encounter (Signed)
 Already taken care of and sent something to the pharmacy for her

## 2024-09-18 NOTE — Telephone Encounter (Signed)
 Spoke with patient she states she's burning when she pees and itching and just irritated, she would like for this to be called into total care

## 2024-09-18 NOTE — Telephone Encounter (Signed)
 PT called upset because she cannot get out of her houe or driveway due to the inclement weather and disabilites. Pt does not have anyone to bring her today for her appt for possible UTI Is it possible we could get something called in for pt to deliver to her house?

## 2024-10-20 ENCOUNTER — Ambulatory Visit: Admitting: Cardiology

## 2024-10-27 ENCOUNTER — Encounter (INDEPENDENT_AMBULATORY_CARE_PROVIDER_SITE_OTHER)

## 2024-10-27 ENCOUNTER — Ambulatory Visit (INDEPENDENT_AMBULATORY_CARE_PROVIDER_SITE_OTHER): Admitting: Vascular Surgery
